# Patient Record
Sex: Male | Born: 1950 | Race: White | Hispanic: No | Marital: Married | Smoking: Former smoker
Health system: Southern US, Community
[De-identification: ages and names within clinical notes are randomized; demographics above are authoritative.]

## PROBLEM LIST (undated history)

## (undated) DIAGNOSIS — M1990 Unspecified osteoarthritis, unspecified site: Secondary | ICD-10-CM

## (undated) DIAGNOSIS — T8859XA Other complications of anesthesia, initial encounter: Secondary | ICD-10-CM

## (undated) DIAGNOSIS — Z720 Tobacco use: Secondary | ICD-10-CM

## (undated) DIAGNOSIS — Z87442 Personal history of urinary calculi: Secondary | ICD-10-CM

## (undated) DIAGNOSIS — R0600 Dyspnea, unspecified: Secondary | ICD-10-CM

## (undated) DIAGNOSIS — I714 Abdominal aortic aneurysm, without rupture, unspecified: Secondary | ICD-10-CM

## (undated) DIAGNOSIS — J439 Emphysema, unspecified: Secondary | ICD-10-CM

## (undated) DIAGNOSIS — J189 Pneumonia, unspecified organism: Secondary | ICD-10-CM

## (undated) DIAGNOSIS — E785 Hyperlipidemia, unspecified: Secondary | ICD-10-CM

## (undated) DIAGNOSIS — R7303 Prediabetes: Secondary | ICD-10-CM

## (undated) DIAGNOSIS — T4145XA Adverse effect of unspecified anesthetic, initial encounter: Secondary | ICD-10-CM

## (undated) DIAGNOSIS — C801 Malignant (primary) neoplasm, unspecified: Secondary | ICD-10-CM

## (undated) DIAGNOSIS — G4730 Sleep apnea, unspecified: Secondary | ICD-10-CM

## (undated) DIAGNOSIS — Z9981 Dependence on supplemental oxygen: Secondary | ICD-10-CM

## (undated) DIAGNOSIS — I739 Peripheral vascular disease, unspecified: Secondary | ICD-10-CM

## (undated) DIAGNOSIS — T7840XA Allergy, unspecified, initial encounter: Secondary | ICD-10-CM

## (undated) DIAGNOSIS — Z85118 Personal history of other malignant neoplasm of bronchus and lung: Secondary | ICD-10-CM

## (undated) DIAGNOSIS — I499 Cardiac arrhythmia, unspecified: Secondary | ICD-10-CM

## (undated) DIAGNOSIS — I10 Essential (primary) hypertension: Secondary | ICD-10-CM

## (undated) DIAGNOSIS — I4891 Unspecified atrial fibrillation: Secondary | ICD-10-CM

## (undated) DIAGNOSIS — I639 Cerebral infarction, unspecified: Secondary | ICD-10-CM

## (undated) HISTORY — DX: Hyperlipidemia, unspecified: E78.5

## (undated) HISTORY — DX: Cerebral infarction, unspecified: I63.9

## (undated) HISTORY — DX: Allergy, unspecified, initial encounter: T78.40XA

## (undated) HISTORY — PX: APPENDECTOMY: SHX54

---

## 1997-09-30 HISTORY — PX: HERNIA REPAIR: SHX51

## 1999-01-10 ENCOUNTER — Emergency Department (HOSPITAL_COMMUNITY): Admission: EM | Admit: 1999-01-10 | Discharge: 1999-01-10 | Payer: Self-pay | Admitting: Emergency Medicine

## 2003-07-09 ENCOUNTER — Emergency Department (HOSPITAL_COMMUNITY): Admission: AD | Admit: 2003-07-09 | Discharge: 2003-07-10 | Payer: Self-pay | Admitting: Emergency Medicine

## 2003-07-11 ENCOUNTER — Emergency Department (HOSPITAL_COMMUNITY): Admission: AD | Admit: 2003-07-11 | Discharge: 2003-07-11 | Payer: Self-pay | Admitting: Family Medicine

## 2009-04-24 ENCOUNTER — Emergency Department (HOSPITAL_COMMUNITY): Admission: EM | Admit: 2009-04-24 | Discharge: 2009-04-24 | Payer: Self-pay | Admitting: Emergency Medicine

## 2010-06-16 ENCOUNTER — Emergency Department (HOSPITAL_BASED_OUTPATIENT_CLINIC_OR_DEPARTMENT_OTHER): Admission: EM | Admit: 2010-06-16 | Discharge: 2010-06-16 | Payer: Self-pay | Admitting: Emergency Medicine

## 2010-06-16 ENCOUNTER — Ambulatory Visit: Payer: Self-pay | Admitting: Interventional Radiology

## 2010-06-28 ENCOUNTER — Ambulatory Visit: Payer: Self-pay | Admitting: Thoracic Surgery

## 2010-06-28 ENCOUNTER — Encounter: Admission: RE | Admit: 2010-06-28 | Discharge: 2010-06-28 | Payer: Self-pay

## 2010-07-03 ENCOUNTER — Ambulatory Visit (HOSPITAL_COMMUNITY): Admission: RE | Admit: 2010-07-03 | Discharge: 2010-07-03 | Payer: Self-pay | Admitting: Thoracic Surgery

## 2010-07-04 ENCOUNTER — Ambulatory Visit: Payer: Self-pay | Admitting: Thoracic Surgery

## 2010-10-22 ENCOUNTER — Other Ambulatory Visit: Payer: Self-pay | Admitting: Thoracic Surgery

## 2010-10-22 DIAGNOSIS — R911 Solitary pulmonary nodule: Secondary | ICD-10-CM

## 2010-10-23 ENCOUNTER — Ambulatory Visit: Admit: 2010-10-23 | Payer: Self-pay | Admitting: Thoracic Surgery

## 2010-12-05 ENCOUNTER — Other Ambulatory Visit: Payer: Self-pay

## 2010-12-13 LAB — GLUCOSE, CAPILLARY: Glucose-Capillary: 103 mg/dL — ABNORMAL HIGH (ref 70–99)

## 2010-12-25 ENCOUNTER — Other Ambulatory Visit: Payer: Self-pay

## 2010-12-25 ENCOUNTER — Ambulatory Visit: Payer: Self-pay | Admitting: Thoracic Surgery

## 2011-02-12 NOTE — Letter (Signed)
July 04, 2010   Sunnie Nielsen, MD  [address]  [place], [place] [address]   Re:  Randall Sullivan, Randall Sullivan              DOB:  1951/05/25   Dear Dr. Dierdre Highman:   I saw the patient in the office today and reviewed his PET scan.  As you  know, this patient has a right upper lobe ground glass opacity that was  seen and a PET scan showed very low level of uptake but lacks from SUV  of 1.6, indicating that this could possibly be inflammatory, but it also  could be a slow-growing bronchoalveolar cancer.  Given that the PET scan  was negative, I think we can safely follow this.  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  I  explained this to the patient and he agrees with the plan.  He has had  recent congestion and cough.  I gave him prescription for Tussionex.  I  plan to see him back in the next 3 months with a CT scan.  I appreciate  the opportunity of seeing the patient.   Sincerely,   Ines Bloomer, M.D.  Electronically Signed   DPB/MEDQ  D:  07/04/2010  T:  07/05/2010  Job:  [id number]

## 2011-02-12 NOTE — Letter (Signed)
June 28, 2010   Sunnie Nielsen, MD  [address]  [place], [place] [address]   Re:  Randall Sullivan, Randall Sullivan              DOB:  1951/06/25   Dear Dr. Dierdre Highman:   I saw the patient in the office today.  This 60 year old patient with a  long history of chronic obstructive pulmonary disease and smoking up to  1/2 a pack per day.  He recently had an exacerbation of his chronic  obstructive pulmonary disease and underwent a chest x-ray that showed a  right lower lobe lesion and then a CT scan, which showed a spiculated1.7  x 1.5 lesion in the left upper lobe that was thought to be to an early  cancer or scarring.  He had no hemoptysis, fevers, chills, or excessive  sputum.   MEDICATIONS:  1. Prednisone 20 mg for 5 days.  2. Albuterol.   ALLERGIES:  He has had no allergies.   SOCIAL HISTORY:  He has had emphysema for 8-9 years.  He is married. He  works in Statistician.[name]  He smokes a pack of cigarettes every 2-3 days.  Does not drink alcohol on a regular basis.   REVIEW OF SYSTEMS:  VITAL SIGNS:  He is 103 pounds.  He is 5 feet 10  inches.  GENERAL:  He has had weight loss.  CARDIAC:  No angina or atrial fibrillation.  PULMONARY:  He has had a chronic cough.  No hemoptysis.  GI:  No nausea, vomiting, constipation, or diarrhea.  GU:  No kidney disease, dysuria, or frequent urination.  VASCULAR:  No claudication, DVT, or TIAs.  NEUROLOGICAL:  No dizziness, headaches, blackouts, or seizures.  MUSCULOSKELETAL:  No arthritis or rash.  PSYCHIATRIC:  No depression or nervousness.  EYE/ENT:  No changes in eyesight or hearing.  HEMATOLOGICAL:  No problems with bleeding, clotting disorders, or  anemia.   PHYSICAL EXAMINATION:  General:  He is a well-developed Caucasian male,  in no acute distress.  Head, Eyes, Ears, Nose, and Throat:  Unremarkable.  Vital Signs:  His blood pressure was 129/77, pulse 72,  respirations 18, and sats were 96%.  Chest:  There is increased AP  diameter with some distal  wheezes.  Heart:  Regular sinus rhythm.  Abdomen:  Soft.  There is no hepatosplenomegaly.  Extremities:  Pulses  are 2+.  There is no clubbing or edema.  Neurologic:  He is oriented x3.  Sensory and motor intact.  Cranial nerves are intact.   I feel that this lesion could be just a scar as you see sometimes in  people with chronic obstructive pulmonary disease or it could be that  this is an early cancer.  The next step is to get a PET scan and if that  is positive, then we will proceed with workup.  If that is negative,  then we will just elect to follow up with serial CTs.  I will see him  back again after his PET scan.   Sincerely,   Ines Bloomer, M.D.  Electronically Signed   DPB/MEDQ  D:  06/28/2010  T:  06/29/2010  Job:  [id number]   cc:   Lajuana Matte, MD

## 2011-10-04 ENCOUNTER — Emergency Department (HOSPITAL_COMMUNITY): Payer: Self-pay

## 2011-10-04 ENCOUNTER — Emergency Department (HOSPITAL_COMMUNITY)
Admission: EM | Admit: 2011-10-04 | Discharge: 2011-10-04 | Disposition: A | Discharge status: Home / Self Care | Payer: Self-pay | Attending: Emergency Medicine | Admitting: Emergency Medicine

## 2011-10-04 ENCOUNTER — Encounter: Payer: Self-pay | Admitting: *Deleted

## 2011-10-04 DIAGNOSIS — F17200 Nicotine dependence, unspecified, uncomplicated: Secondary | ICD-10-CM | POA: Insufficient documentation

## 2011-10-04 DIAGNOSIS — R509 Fever, unspecified: Secondary | ICD-10-CM | POA: Insufficient documentation

## 2011-10-04 DIAGNOSIS — J438 Other emphysema: Secondary | ICD-10-CM | POA: Insufficient documentation

## 2011-10-04 DIAGNOSIS — R1013 Epigastric pain: Secondary | ICD-10-CM | POA: Insufficient documentation

## 2011-10-04 LAB — COMPREHENSIVE METABOLIC PANEL
Alkaline Phosphatase: 60 U/L (ref 39–117)
BUN: 12 mg/dL (ref 6–23)
CO2: 31 mEq/L (ref 19–32)
Chloride: 99 mEq/L (ref 96–112)
Creatinine, Ser: 0.83 mg/dL (ref 0.50–1.35)
GFR calc Af Amer: >90 mL/min (ref >90)
Sodium: 137 mEq/L (ref 135–145)
Total Protein: 7.8 g/dL (ref 6.0–8.3)

## 2011-10-04 LAB — DIFFERENTIAL
Basophils Absolute: 0 10*3/uL (ref 0.0–0.1)
Basophils Relative: 0 % (ref 0–1)
Lymphocytes Relative: 32 % (ref 12–46)
Neutro Abs: 3.7 10*3/uL (ref 1.7–7.7)

## 2011-10-04 LAB — URINALYSIS, ROUTINE W REFLEX MICROSCOPIC
Ketones, ur: NEGATIVE mg/dL
Leukocytes, UA: NEGATIVE
Nitrite: NEGATIVE
Protein, ur: NEGATIVE mg/dL
pH: 6.5 (ref 5.0–8.0)

## 2011-10-04 LAB — CBC
MCHC: 32.7 g/dL (ref 30.0–36.0)
Platelets: 200 10*3/uL (ref 150–400)
RDW: 14.6 % (ref 11.5–15.5)
WBC: 6.3 10*3/uL (ref 4.0–10.5)

## 2011-10-04 MED ORDER — HYDROCODONE-ACETAMINOPHEN 5-500 MG PO TABS: 1.0000 | ORAL_TABLET | Freq: Four times a day (QID) | ORAL | Status: AC | PRN

## 2011-10-04 NOTE — ED Provider Notes (Addendum)
History     CSN: [id number]  Arrival date & time 10/04/11  0810   First MD Initiated Contact with Patient 10/04/11 1002      Chief Complaint  Patient presents with  . Fever  . Abdominal Pain    (Consider location/radiation/quality/duration/timing/severity/associated sxs/prior treatment) HPI Comments: Three day history of epigastric pain, worse at night and sitting forward.  Feels fevered at night.  Also complains of cough with history of copd.    Patient is a 61 y.o. male presenting with abdominal pain. The history is provided by the patient.  Abdominal Pain The primary symptoms of the illness include abdominal pain and fever. The current episode started more than 2 days ago. The onset of the illness was gradual. The problem has been gradually worsening.  Episode onset: feels fevered at night.  Associated with: nothing.    Past Medical History  Diagnosis Date  . Emphysema     History reviewed. No pertinent past surgical history.  History reviewed. No pertinent family history.  History  Substance Use Topics  . Smoking status: Current Everyday Smoker -- 0.5 packs/day    Types: Cigarettes  . Smokeless tobacco: Not on file  . Alcohol Use: No      Review of Systems  Constitutional: Positive for fever.  Gastrointestinal: Positive for abdominal pain.  All other systems reviewed and are negative.    Allergies  Review of patient's allergies indicates no known allergies.  Home Medications   Current Outpatient Rx  Name Route Sig Dispense Refill  . ALBUTEROL SULFATE HFA 108 (90 BASE) MCG/ACT IN AERS Inhalation Inhale 2 puffs into the lungs every 6 (six) hours as needed. For shortness of breath      . MUCINEX FAST-MAX DM MAX PO Oral Take 20 mLs by mouth every 6 (six) hours as needed. congestion      BP 148/89  Pulse 88  Temp 97.5 F (36.4 C)  Resp 18  SpO2 99%  Physical Exam  Nursing note and vitals reviewed. Constitutional: He is oriented to person, place,  and time. He appears well-developed and well-nourished. No distress.  HENT:  Head: Normocephalic and atraumatic.  Neck: Normal range of motion. Neck supple.  Cardiovascular: Normal rate and regular rhythm.  Exam reveals no friction rub.   No murmur heard. Pulmonary/Chest: Effort normal and breath sounds normal. No respiratory distress. He has no wheezes.  Abdominal: Soft. Bowel sounds are normal. He exhibits no distension. There is no tenderness.       No epigastric ttp.  Musculoskeletal: Normal range of motion. He exhibits no edema.  Neurological: He is alert and oriented to person, place, and time.  Skin: Skin is warm and dry. He is not diaphoretic.    ED Course  Procedures (including critical care time)   Labs Reviewed  CBC  DIFFERENTIAL  COMPREHENSIVE METABOLIC PANEL  URINALYSIS, ROUTINE W REFLEX MICROSCOPIC  LIPASE, BLOOD   No results found.   No diagnosis found.    MDM  Labs okay, abdomen benign.  Will discharge to home with pain meds, time.  To return prn.          Geoffery Lyons, MD 10/04/11 1254  Geoffery Lyons, MD 10/04/11 1254

## 2011-10-04 NOTE — ED Notes (Signed)
Pt reports having fever, abd pain and lower back pain for several days. Having nausea, no vomiting or diarrhea. No acute distress noted at triage.

## 2011-10-04 NOTE — ED Notes (Signed)
Pt. reports a 3 day hx. Of coughing and Flu like s/s.  PT. reports epigastric pain that he noticed after a coughing spell.  Pt. Is concerned about the coughing due to his hx. Of Empasma.

## 2012-11-04 ENCOUNTER — Emergency Department (HOSPITAL_COMMUNITY)
Admission: EM | Admit: 2012-11-04 | Discharge: 2012-11-04 | Disposition: A | Discharge status: Home / Self Care | Payer: Self-pay | Attending: Emergency Medicine | Admitting: Emergency Medicine

## 2012-11-04 ENCOUNTER — Encounter (HOSPITAL_COMMUNITY): Payer: Self-pay | Admitting: Adult Health

## 2012-11-04 ENCOUNTER — Emergency Department (HOSPITAL_COMMUNITY): Payer: Self-pay

## 2012-11-04 DIAGNOSIS — Y92009 Unspecified place in unspecified non-institutional (private) residence as the place of occurrence of the external cause: Secondary | ICD-10-CM | POA: Insufficient documentation

## 2012-11-04 DIAGNOSIS — J438 Other emphysema: Secondary | ICD-10-CM | POA: Insufficient documentation

## 2012-11-04 DIAGNOSIS — F17200 Nicotine dependence, unspecified, uncomplicated: Secondary | ICD-10-CM | POA: Insufficient documentation

## 2012-11-04 DIAGNOSIS — S39012A Strain of muscle, fascia and tendon of lower back, initial encounter: Secondary | ICD-10-CM

## 2012-11-04 DIAGNOSIS — S20229A Contusion of unspecified back wall of thorax, initial encounter: Secondary | ICD-10-CM | POA: Insufficient documentation

## 2012-11-04 DIAGNOSIS — S335XXA Sprain of ligaments of lumbar spine, initial encounter: Secondary | ICD-10-CM | POA: Insufficient documentation

## 2012-11-04 DIAGNOSIS — Z79899 Other long term (current) drug therapy: Secondary | ICD-10-CM | POA: Insufficient documentation

## 2012-11-04 DIAGNOSIS — W19XXXA Unspecified fall, initial encounter: Secondary | ICD-10-CM

## 2012-11-04 DIAGNOSIS — Y9389 Activity, other specified: Secondary | ICD-10-CM | POA: Insufficient documentation

## 2012-11-04 DIAGNOSIS — W138XXA Fall from, out of or through other building or structure, initial encounter: Secondary | ICD-10-CM | POA: Insufficient documentation

## 2012-11-04 DIAGNOSIS — S300XXA Contusion of lower back and pelvis, initial encounter: Secondary | ICD-10-CM

## 2012-11-04 MED ORDER — HYDROCODONE-ACETAMINOPHEN 5-325 MG PO TABS: 1.0000 | ORAL_TABLET | Freq: Four times a day (QID) | ORAL | Status: DC | PRN

## 2012-11-04 MED ORDER — MORPHINE SULFATE 4 MG/ML IJ SOLN
6.0000 mg | Freq: Once | INTRAMUSCULAR | Status: AC
  Administered 2012-11-04: 6 mg via INTRAVENOUS
  Filled 2012-11-04: qty 2

## 2012-11-04 MED ORDER — IBUPROFEN 800 MG PO TABS: 800.0000 mg | ORAL_TABLET | Freq: Three times a day (TID) | ORAL | Status: DC | PRN

## 2012-11-04 MED ORDER — CYCLOBENZAPRINE HCL 10 MG PO TABS: 10.0000 mg | ORAL_TABLET | Freq: Three times a day (TID) | ORAL | Status: DC | PRN

## 2012-11-04 NOTE — ED Provider Notes (Signed)
History     CSN: [id number]  Arrival date & time 11/04/12  1648   First MD Initiated Contact with Patient 11/04/12 1720      Chief Complaint  Patient presents with  . Fall    (Consider location/radiation/quality/duration/timing/severity/associated sxs/prior treatment) Patient is a 62 y.o. male presenting with fall.  Fall   Patient is a 62 year old male who presents today after a fall.   He was on his roof about an hour and a half ago and slipped and fell off of the roof which was about 12 feet off of the ground.  He landed on his back and hit his head.  Denies loss of consciousness, headache, neck pain, LOC, dizziness,  or changes in vision.  Can flex and extend his back but with some pain.  Rotation at the hips helps alleviate the pain.  Also complains of right thumb pain and swelling which he cannot move.    Past Medical History  Diagnosis Date  . Emphysema     History reviewed. No pertinent past surgical history.  History reviewed. No pertinent family history.  History  Substance Use Topics  . Smoking status: Current Every Day Smoker -- 0.5 packs/day    Types: Cigarettes  . Smokeless tobacco: Not on file  . Alcohol Use: No      Review of Systems All other systems negative except as documented in the HPI. All pertinent positives and negatives as reviewed in the HPI.  Allergies  Review of patient's allergies indicates no known allergies.  Home Medications   Current Outpatient Rx  Name  Route  Sig  Dispense  Refill  . ALBUTEROL SULFATE HFA 108 (90 BASE) MCG/ACT IN AERS   Inhalation   Inhale 2 puffs into the lungs every 6 (six) hours as needed. For shortness of breath           . MUCINEX FAST-MAX DM MAX PO   Oral   Take 20 mLs by mouth every 6 (six) hours as needed. congestion           BP 162/93  Pulse 95  Temp 98.7 F (37.1 C)  Resp 22  SpO2 90%  Physical Exam  Constitutional: He is oriented to person, place, and time. He appears well-developed  and well-nourished.  HENT:  Head: Normocephalic and atraumatic.  Mouth/Throat: Oropharynx is clear and moist.  Eyes: Pupils are equal, round, and reactive to light.  Neck: Normal range of motion.  Cardiovascular: Normal rate, regular rhythm and normal heart sounds.  Exam reveals no gallop and no friction rub.   No murmur heard. Pulmonary/Chest: Effort normal and breath sounds normal. No respiratory distress. He exhibits no tenderness.  Abdominal: Soft. Bowel sounds are normal. He exhibits no distension. There is no tenderness.  Musculoskeletal:       Cervical back: Normal.       Thoracic back: Normal.       Lumbar back: He exhibits tenderness and pain.  Neurological: He is alert and oriented to person, place, and time. He has normal strength and normal reflexes.  Skin: Skin is warm and dry.     ED Course  Procedures (including critical care time)   6:00pm patient received morphine 6:30pm patient is feeling better after receiving morphine.  Resting comfortably.    MDM  Patient does not have any cervical spine tenderness, or motor deficits. The patient has normal reflexes.         Carlyle Dolly, PA-C 11/05/12 1931

## 2012-11-04 NOTE — ED Notes (Signed)
Fall from roof at 16:00 c/o lower back pain, buttock pain. Able to move all extremities, bilateral breath sounds clear and equal. Denies SOB.

## 2012-11-04 NOTE — ED Notes (Signed)
Pt ambulated down the hallway without issue.  PA made aware

## 2012-11-04 NOTE — ED Notes (Signed)
EDP in room with pt 

## 2012-11-04 NOTE — ED Notes (Signed)
Patient transported to X-ray 

## 2012-11-04 NOTE — ED Notes (Signed)
Returns from xray

## 2012-11-09 NOTE — ED Provider Notes (Signed)
Medical screening examination/treatment/procedure(s) were conducted as a shared visit with non-physician practitioner(s) and myself.  I personally evaluated the patient during the encounter. Patient had some lower back discomfort after the fall. Patient did much better after analgesia. No sign of vertebral or spinal injury. Patient discharged.  Gilda Crease, MD 11/09/12 1316

## 2014-10-06 DIAGNOSIS — I10 Essential (primary) hypertension: Secondary | ICD-10-CM | POA: Insufficient documentation

## 2014-11-27 ENCOUNTER — Emergency Department (HOSPITAL_COMMUNITY)
Admission: EM | Admit: 2014-11-27 | Discharge: 2014-11-27 | Disposition: A | Discharge status: Home / Self Care | Payer: Self-pay | Attending: Emergency Medicine | Admitting: Emergency Medicine

## 2014-11-27 ENCOUNTER — Emergency Department (HOSPITAL_COMMUNITY): Payer: Self-pay

## 2014-11-27 ENCOUNTER — Encounter (HOSPITAL_COMMUNITY): Payer: Self-pay | Admitting: Emergency Medicine

## 2014-11-27 DIAGNOSIS — L03115 Cellulitis of right lower limb: Secondary | ICD-10-CM | POA: Insufficient documentation

## 2014-11-27 DIAGNOSIS — Z720 Tobacco use: Secondary | ICD-10-CM | POA: Insufficient documentation

## 2014-11-27 DIAGNOSIS — I10 Essential (primary) hypertension: Secondary | ICD-10-CM | POA: Insufficient documentation

## 2014-11-27 DIAGNOSIS — Z79899 Other long term (current) drug therapy: Secondary | ICD-10-CM | POA: Insufficient documentation

## 2014-11-27 DIAGNOSIS — M79671 Pain in right foot: Secondary | ICD-10-CM

## 2014-11-27 DIAGNOSIS — Z791 Long term (current) use of non-steroidal anti-inflammatories (NSAID): Secondary | ICD-10-CM | POA: Insufficient documentation

## 2014-11-27 DIAGNOSIS — J439 Emphysema, unspecified: Secondary | ICD-10-CM | POA: Insufficient documentation

## 2014-11-27 HISTORY — DX: Essential (primary) hypertension: I10

## 2014-11-27 MED ORDER — NAPROXEN 500 MG PO TABS: 500.0000 mg | ORAL_TABLET | Freq: Two times a day (BID) | ORAL | Status: DC

## 2014-11-27 MED ORDER — OXYCODONE-ACETAMINOPHEN 5-325 MG PO TABS
1.0000 | ORAL_TABLET | Freq: Once | ORAL | Status: AC
  Administered 2014-11-27: 1 via ORAL
  Filled 2014-11-27: qty 1

## 2014-11-27 MED ORDER — OXYCODONE-ACETAMINOPHEN 5-325 MG PO TABS: 1.0000 | ORAL_TABLET | ORAL | Status: DC | PRN

## 2014-11-27 NOTE — ED Notes (Signed)
Pt reports cellulitis in his R foot, was seen at Urgent Care on Wed. Placed on abx. Pt states the edema is better but still having a lot of pain.

## 2014-11-27 NOTE — ED Provider Notes (Signed)
CSN: [id number]     Arrival date & time 11/27/14  1139 History  This chart was scribed for non-physician practitioner, Kem Parkinson, PA-C, working with Maudry Diego, MD, by Chester Holstein, ED Scribe. This patient was seen in room APFT24/APFT24 and the patient's care was started at 12:19 PM.    Chief Complaint  Patient presents with  . Cellulitis   The history is provided by the patient. No language interpreter was used.   HPI Comments: Randall Sullivan is a 64 y.o. male with PMHx of emphysema and HTN who presents to the Emergency Department complaining of swelling, redness, and pain to right foot. Pt notes he was seen in an Urgent Care, diagnosed with cellulitis. Pt was given Rx for Keflex and Bactrim and pain medication. Pt reports no X-rays were done. Pt started Abx 4 days ago. Pt has not had a f/u yet.  Pt states redness and swelling have improved but notes pain has not changed. Pt notes pain is most centered to plantar surface of foot, with some pain to the lateral foot.  Pt denies any known injury, numbness and pain in rest of right leg. Reports pain is constant , increasing with weight bearing.  Past Medical History  Diagnosis Date  . Emphysema   . Hypertension    History reviewed. No pertinent past surgical history. Family History  Problem Relation Age of Onset  . Cancer Mother   . Heart failure Brother    History  Substance Use Topics  . Smoking status: Current Every Day Smoker -- 0.50 packs/day    Types: Cigarettes  . Smokeless tobacco: Never Used  . Alcohol Use: No    Review of Systems  Constitutional: Negative for fever and chills.  Gastrointestinal: Negative for nausea and vomiting.  Musculoskeletal: Positive for arthralgias (right foot pain).       Edema to right foot  Skin: Negative for color change and wound.  Neurological: Negative for weakness and numbness.  All other systems reviewed and are negative.     Allergies  Review of patient's allergies indicates  no known allergies.  Home Medications   Prior to Admission medications   Medication Sig Start Date End Date Taking? Authorizing Provider  acetaminophen (TYLENOL) 500 MG tablet Take 500 mg by mouth every 6 (six) hours as needed. For pain    Historical Provider, MD  albuterol (PROVENTIL HFA;VENTOLIN HFA) 108 (90 BASE) MCG/ACT inhaler Inhale 2 puffs into the lungs every 6 (six) hours as needed. For shortness of breath      Historical Provider, MD  cyclobenzaprine (FLEXERIL) 10 MG tablet Take 1 tablet (10 mg total) by mouth 3 (three) times daily as needed for muscle spasms. 11/04/12   Brent General, PA-C  HYDROcodone-acetaminophen (NORCO/VICODIN) 5-325 MG per tablet Take 1 tablet by mouth every 6 (six) hours as needed for pain. 11/04/12   [place], PA-C  ibuprofen (ADVIL,MOTRIN) 800 MG tablet Take 1 tablet (800 mg total) by mouth every 8 (eight) hours as needed for pain. 11/04/12   [place], PA-C   BP 131/79 mmHg  Pulse 83  Temp(Src) 97.5 F (36.4 C) (Oral)  Resp 20  Ht 5\' 10"  (1.778 m)  Wt 184 lb (83.462 kg)  BMI 26.40 kg/m2  SpO2 97% Physical Exam  Constitutional: He is oriented to person, place, and time. He appears well-developed and well-nourished.  HENT:  Head: Normocephalic.  Cardiovascular: Normal rate, regular rhythm, normal heart sounds and intact distal pulses.  Exam reveals  no friction rub.   No murmur heard. Distal pulses intact  Pulmonary/Chest: Effort normal and breath sounds normal. No respiratory distress. He has no wheezes. He has no rales.  Musculoskeletal: Normal range of motion.  Localized edema of the dorsal right foot; TTP to the lateral foot and first metatarsal. No bony deformity noted  Neurological: He is alert and oriented to person, place, and time. He exhibits normal muscle tone. Coordination normal.  Normal sensation, DP pulses brisk  Skin: Skin is warm and dry.  Psychiatric: He has a normal mood and affect. His behavior is normal.   Nursing note and vitals reviewed.   ED Course  Procedures (including critical care time) DIAGNOSTIC STUDIES: Oxygen Saturation is 97% on room air, normal by my interpretation.    COORDINATION OF CARE: 12:28 PM Discussed treatment plan with patient at beside, the patient agrees with the plan and has no further questions at this time.   Labs Review Labs Reviewed - No data to display  Imaging Review Dg Foot Complete Right  11/27/2014   CLINICAL DATA:  Right foot pain/swelling/redness  EXAM: RIGHT FOOT COMPLETE - 3+ VIEW  COMPARISON:  None.  FINDINGS: No fracture or dislocation is seen.  The joint spaces are preserved.  The visualized soft tissues are unremarkable.  IMPRESSION: No acute osseous abnormality is seen.   Electronically Signed   By: Julian Hy M.D.   On: 11/27/2014 13:41      EKG Interpretation None      MDM   Final diagnoses:  Right foot pain  Cellulitis of right lower extremity   patient currently taking keflex and bactrim for previously diagnosed cellulitis of the right foot.  Remains NV intact. Compartments of the right LE are soft.  Pain likely related to the cellulitis, xray neg for fx    I personally performed the services described in this documentation, which was scribed in my presence. The recorded information has been reviewed and is accurate.     Jadin Creque L. Vanessa [place], PA-C 11/29/14 [place], MD 12/01/14 [phone]

## 2014-11-27 NOTE — Discharge Instructions (Signed)

## 2015-08-01 ENCOUNTER — Encounter: Payer: Self-pay | Admitting: Family Medicine

## 2015-08-01 ENCOUNTER — Encounter (INDEPENDENT_AMBULATORY_CARE_PROVIDER_SITE_OTHER): Payer: Self-pay

## 2015-08-01 ENCOUNTER — Ambulatory Visit: Payer: Self-pay | Admitting: Family Medicine

## 2015-08-01 ENCOUNTER — Other Ambulatory Visit: Payer: Self-pay | Admitting: *Deleted

## 2015-08-01 VITALS — BP 145/85 | HR 76 | Temp 97.0°F | Ht 69.0 in | Wt 184.0 lb

## 2015-08-01 DIAGNOSIS — K30 Functional dyspepsia: Secondary | ICD-10-CM

## 2015-08-01 DIAGNOSIS — J449 Chronic obstructive pulmonary disease, unspecified: Secondary | ICD-10-CM

## 2015-08-01 DIAGNOSIS — M159 Polyosteoarthritis, unspecified: Secondary | ICD-10-CM

## 2015-08-01 DIAGNOSIS — F17200 Nicotine dependence, unspecified, uncomplicated: Secondary | ICD-10-CM

## 2015-08-01 DIAGNOSIS — J309 Allergic rhinitis, unspecified: Secondary | ICD-10-CM

## 2015-08-01 DIAGNOSIS — I10 Essential (primary) hypertension: Secondary | ICD-10-CM

## 2015-08-01 DIAGNOSIS — M150 Primary generalized (osteo)arthritis: Secondary | ICD-10-CM

## 2015-08-01 MED ORDER — ALBUTEROL SULFATE (2.5 MG/3ML) 0.083% IN NEBU: 3.0000 mL | INHALATION_SOLUTION | Freq: Four times a day (QID) | RESPIRATORY_TRACT | Status: DC | PRN

## 2015-08-01 MED ORDER — FEXOFENADINE HCL 180 MG PO TABS: 180.0000 mg | ORAL_TABLET | Freq: Every day | ORAL | Status: DC

## 2015-08-01 MED ORDER — ALBUTEROL SULFATE HFA 108 (90 BASE) MCG/ACT IN AERS: 2.0000 | INHALATION_SPRAY | Freq: Four times a day (QID) | RESPIRATORY_TRACT | Status: DC | PRN

## 2015-08-01 NOTE — Progress Notes (Signed)
Subjective:  Patient ID: Randall Sullivan, male    DOB: Jul 19, 1951  Age: 64 y.o. MRN: [id number]  CC: Establish Care; Hypertension; and COPD   HPI Randall Sullivan presents for COPD. Out of neb med. USes inhaler prn. He says that it's cheaper for him to get the nebulizer solution and use that twice a day. Therefore he does not use the inhaler very often. He does use of his rescue when he is away from home. He states that his shortness of breath is mild. 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 He can climb ladders etc. without any adverse effect.   follow-up of hypertension. Patient has no history of headache chest pain or shortness of breath or recent cough. Patient also denies symptoms of TIA such as numbness weakness lateralizing. Patient does not check blood pressure he's been out of his medicine for several days.  History Randall Sullivan has a past medical history of Emphysema and Hypertension.  COPD He has no past surgical history on file. has never had surgery.  His family history includes Aneurysm in his sister; Cancer in his mother and sister; Kidney disease in his paternal aunt.He reports that he has been smoking Cigarettes.  He has been smoking about 0.50 packs per day. He has never used smokeless tobacco. He reports that he does not drink alcohol or use illicit drugs.    ROS Review of Systems  Constitutional: Negative for fever, chills, diaphoresis, activity change, appetite change, fatigue and unexpected weight change.  HENT: Negative for congestion, ear pain, hearing loss, postnasal drip, rhinorrhea, sore throat, tinnitus and trouble swallowing.   Eyes: Negative for photophobia, pain, discharge and redness.  Respiratory: Positive for cough and shortness of breath (periodic, see history of present illness). Negative for apnea, choking, chest tightness, wheezing and stridor.   Cardiovascular: Negative for chest pain, palpitations and leg swelling.  Gastrointestinal:  Negative for nausea, vomiting, abdominal pain, diarrhea, constipation, blood in stool and abdominal distention.       Occasional heartburn when he eats close to bedtime. This is because of his irregular work schedule. Usually relieved pretty easily with OTC antacids  Endocrine: Negative for cold intolerance, heat intolerance, polydipsia, polyphagia and polyuria.  Genitourinary: Negative for dysuria, urgency, frequency, hematuria, flank pain, enuresis, difficulty urinating and genital sores.  Musculoskeletal: Negative for joint swelling and arthralgias.  Skin: Negative for color change, rash and wound.  Allergic/Immunologic: Negative for immunocompromised state.  Neurological: Negative for dizziness, tremors, seizures, syncope, facial asymmetry, speech difficulty, weakness, light-headedness, numbness and headaches.  Hematological: Does not bruise/bleed easily.  Psychiatric/Behavioral: Negative for suicidal ideas, hallucinations, behavioral problems, confusion, sleep disturbance, dysphoric mood, decreased concentration and agitation. The patient is not nervous/anxious and is not hyperactive.     Objective:  BP 145/85 mmHg  Pulse 76  Temp(Src) 97 F (36.1 C) (Oral)  Ht '5\' 9"'$  (1.753 m)  Wt 184 lb (83.462 kg)  BMI 27.16 kg/m2  BP Readings from Last 3 Encounters:  08/01/15 145/85  11/27/14 131/78  11/04/12 133/72    Wt Readings from Last 3 Encounters:  08/01/15 184 lb (83.462 kg)  11/27/14 184 lb (83.462 kg)     Physical Exam  Constitutional: He is oriented to person, place, and time. He appears well-developed and well-nourished.  HENT:  Head: Normocephalic and atraumatic.  Mouth/Throat: Oropharynx is clear and moist.  Eyes: EOM are normal. Pupils are equal, round, and reactive to light.  Neck: Normal range of motion.  No tracheal deviation present. No thyromegaly present.  Cardiovascular: Normal rate, regular rhythm and normal heart sounds.  Exam reveals no gallop and no friction  rub.   No murmur heard. Pulmonary/Chest: Breath sounds normal. He has no wheezes. He has no rales.  Abdominal: Soft. He exhibits no mass. There is no tenderness.  Musculoskeletal: Normal range of motion. He exhibits no edema.  Neurological: He is alert and oriented to person, place, and time.  Skin: Skin is warm and dry.  Psychiatric: He has a normal mood and affect.    No results found for: HGBA1C  Lab Results  Component Value Date   WBC 6.3 10/04/2011   HGB 15.9 10/04/2011   HCT 48.6 10/04/2011   PLT 200 10/04/2011   GLUCOSE 101* 10/04/2011   ALT 25 10/04/2011   AST 24 10/04/2011   NA 137 10/04/2011   K 4.5 10/04/2011   CL 99 10/04/2011   CREATININE 0.83 10/04/2011   BUN 12 10/04/2011   CO2 31 10/04/2011    Dg Foot Complete Right  11/27/2014  CLINICAL DATA:  Right foot pain/swelling/redness EXAM: RIGHT FOOT COMPLETE - 3+ VIEW COMPARISON:  None. FINDINGS: No fracture or dislocation is seen. The joint spaces are preserved. The visualized soft tissues are unremarkable. IMPRESSION: No acute osseous abnormality is seen. Electronically Signed   By: Julian Hy M.D.   On: 11/27/2014 13:41    Assessment & Plan:   Randall Sullivan was seen today for establish care, hypertension and copd.  Diagnoses and all orders for this visit:  Essential hypertension  COPD mixed type ([place])  Tobacco use disorder  Allergic rhinitis, unspecified allergic rhinitis type  Mild dietary indigestion  Primary osteoarthritis involving multiple joints  Other orders -     fexofenadine (ALLEGRA) 180 MG tablet; Take 1 tablet (180 mg total) by mouth daily. -     albuterol (PROVENTIL HFA;VENTOLIN HFA) 108 (90 BASE) MCG/ACT inhaler; Inhale 2 puffs into the lungs every 6 (six) hours as needed. For shortness of breath   I have discontinued Mr. Dunaway's cyclobenzaprine, ibuprofen, sulfamethoxazole-trimethoprim, cephALEXin, oxyCODONE-acetaminophen, and naproxen. I have also changed his albuterol. Additionally,  I am having him start on fexofenadine. Lastly, I am having him maintain his acetaminophen and lisinopril.  Meds ordered this encounter  Medications  . lisinopril (PRINIVIL,ZESTRIL) 20 MG tablet    Sig: Take 20 mg by mouth daily.  [name] DISCONTD: albuterol (PROVENTIL) (2.5 MG/3ML) 0.083% nebulizer solution    Sig: Take 3 mLs by nebulization 4 (four) times daily as needed.  . fexofenadine (ALLEGRA) 180 MG tablet    Sig: Take 1 tablet (180 mg total) by mouth daily.    Dispense:  30 tablet    Refill:  11  . albuterol (PROVENTIL HFA;VENTOLIN HFA) 108 (90 BASE) MCG/ACT inhaler    Sig: Inhale 2 puffs into the lungs every 6 (six) hours as needed. For shortness of breath    Dispense:  1 Inhaler    Refill:  11   Use Pepcid AC as needed for occasional heartburn and indigestion. On the other hand if indigestion and heartburn become a frequent occurrence 2-3 or more times a week consider using omeprazole 40 mg daily every day.   Follow-up: Return in about 6 months (around 01/29/2016) for COPD, hypertension, Arthritis.  Claretta Fraise, M.D.

## 2015-08-01 NOTE — Patient Instructions (Signed)
Use Pepcid AC as needed for occasional heartburn and indigestion. On the other hand if indigestion and heartburn become a frequent occurrence 2-3 or more times a week consider using omeprazole 40 mg daily every day.

## 2015-09-12 ENCOUNTER — Telehealth: Payer: Self-pay | Admitting: Family Medicine

## 2015-09-12 MED ORDER — AMOXICILLIN 500 MG PO CAPS: 500.0000 mg | ORAL_CAPSULE | Freq: Three times a day (TID) | ORAL | Status: DC

## 2015-09-12 NOTE — Telephone Encounter (Signed)
Pt notified rx sent into pharmacy

## 2015-09-12 NOTE — Telephone Encounter (Signed)
Please review and advise.

## 2015-09-12 NOTE — Telephone Encounter (Signed)
Amoxicillin prescription sent.

## 2015-09-15 ENCOUNTER — Other Ambulatory Visit: Payer: Self-pay | Admitting: Family Medicine

## 2015-10-01 DIAGNOSIS — I4891 Unspecified atrial fibrillation: Secondary | ICD-10-CM

## 2015-10-01 HISTORY — DX: Unspecified atrial fibrillation: I48.91

## 2015-11-07 ENCOUNTER — Other Ambulatory Visit: Payer: Self-pay | Admitting: Family Medicine

## 2015-11-16 ENCOUNTER — Other Ambulatory Visit: Payer: Self-pay | Admitting: Family Medicine

## 2015-11-16 MED ORDER — HYDROCORTISONE ACETATE 25 MG RE SUPP: 25.0000 mg | Freq: Two times a day (BID) | RECTAL | Status: DC

## 2015-11-16 MED ORDER — LIDOCAINE (ANORECTAL) 5 % EX CREA: TOPICAL_CREAM | CUTANEOUS | Status: DC

## 2015-11-16 NOTE — Telephone Encounter (Signed)
Pt is requesting this asap, Stacks pt.

## 2015-11-16 NOTE — Telephone Encounter (Signed)
I have sent hydrocortisone suppositories, this will do more for the inflammation of hemorrhoids.  I have also sent lidocaine cream by request   Laroy Apple, MD [facility] 11/16/2015, 2:11 PM

## 2015-11-20 NOTE — Telephone Encounter (Signed)
Left detailed message that rx's sent to pharmacy and to [facility] with any further questions or concerns.

## 2015-12-21 ENCOUNTER — Other Ambulatory Visit: Payer: Self-pay | Admitting: Family Medicine

## 2016-01-02 ENCOUNTER — Other Ambulatory Visit: Payer: Self-pay | Admitting: Family Medicine

## 2016-01-21 ENCOUNTER — Other Ambulatory Visit: Payer: Self-pay | Admitting: Family Medicine

## 2016-01-29 ENCOUNTER — Ambulatory Visit: Payer: Self-pay | Admitting: Family Medicine

## 2016-02-03 ENCOUNTER — Other Ambulatory Visit: Payer: Self-pay | Admitting: Family Medicine

## 2016-02-18 ENCOUNTER — Other Ambulatory Visit: Payer: Self-pay | Admitting: Family Medicine

## 2016-02-21 ENCOUNTER — Other Ambulatory Visit: Payer: Self-pay

## 2016-02-21 ENCOUNTER — Encounter: Payer: Self-pay | Admitting: Family Medicine

## 2016-02-21 ENCOUNTER — Ambulatory Visit (INDEPENDENT_AMBULATORY_CARE_PROVIDER_SITE_OTHER): Payer: Self-pay | Admitting: Family Medicine

## 2016-02-21 VITALS — BP 129/82 | HR 75 | Temp 97.0°F | Ht 69.0 in | Wt 176.4 lb

## 2016-02-21 DIAGNOSIS — Z1211 Encounter for screening for malignant neoplasm of colon: Secondary | ICD-10-CM

## 2016-02-21 DIAGNOSIS — M150 Primary generalized (osteo)arthritis: Secondary | ICD-10-CM

## 2016-02-21 DIAGNOSIS — J449 Chronic obstructive pulmonary disease, unspecified: Secondary | ICD-10-CM

## 2016-02-21 DIAGNOSIS — N400 Enlarged prostate without lower urinary tract symptoms: Secondary | ICD-10-CM

## 2016-02-21 DIAGNOSIS — I10 Essential (primary) hypertension: Secondary | ICD-10-CM

## 2016-02-21 DIAGNOSIS — M545 Low back pain, unspecified: Secondary | ICD-10-CM

## 2016-02-21 DIAGNOSIS — M25579 Pain in unspecified ankle and joints of unspecified foot: Secondary | ICD-10-CM

## 2016-02-21 DIAGNOSIS — K649 Unspecified hemorrhoids: Secondary | ICD-10-CM

## 2016-02-21 DIAGNOSIS — M159 Polyosteoarthritis, unspecified: Secondary | ICD-10-CM

## 2016-02-21 MED ORDER — TAMSULOSIN HCL 0.4 MG PO CAPS: 0.8000 mg | ORAL_CAPSULE | Freq: Every day | ORAL | Status: DC

## 2016-02-21 MED ORDER — CELECOXIB 200 MG PO CAPS: 200.0000 mg | ORAL_CAPSULE | Freq: Every day | ORAL | Status: DC

## 2016-02-21 NOTE — Progress Notes (Signed)
Subjective:  Patient ID: Randall Sullivan, male    DOB: 1950/10/17  Age: 65 y.o. MRN: [id number]  CC: Hypertension   HPI Randall Sullivan presents for  follow-up of hypertension. Patient has no history of headache chest pain or shortness of breath or recent cough. Patient also denies symptoms of TIA such as numbness weakness lateralizing. Patient checks  blood pressure at home and has not had any elevated readings recently. Patient denies side effects from medication. States taking it regularly.  Also complaining of pain in the feet. He points to the ball of the foot primarily at the base of the first toe bilaterally but also going toward the fifth toe at its base as well. Pain also is at the heel bilaterally.  Additionally patient is concerned that he has to get up 45 times a night to go to the bathroom. He is having some pain at the midline L5-S1 region. He is concerned that means he has gotten a prostate infection. Additionally he states that he does not have any burning. He goes just a little bit when he goes.  Patient states that he's had intermittent exacerbations of back pain for many years ever since a fall that hurt his back. Again is at the midline L5/S1.   History Randall Sullivan has a past medical history of Emphysema and Hypertension.   He has no past surgical history on file.   His family history includes Aneurysm in his sister; Cancer in his mother and sister; Kidney disease in his paternal aunt.He reports that he has been smoking Cigarettes.  He has been smoking about 0.50 packs per day. He has never used smokeless tobacco. He reports that he does not drink alcohol or use illicit drugs.  Current Outpatient Prescriptions on File Prior to Visit  Medication Sig Dispense Refill  . acetaminophen (TYLENOL) 500 MG tablet Take 500 mg by mouth every 6 (six) hours as needed. For pain    . albuterol (PROVENTIL HFA;VENTOLIN HFA) 108 (90 BASE) MCG/ACT inhaler Inhale 2 puffs into the lungs every 6 (six) hours  as needed. For shortness of breath 1 Inhaler 11  . albuterol (PROVENTIL) (2.5 MG/3ML) 0.083% nebulizer solution USE 1 VIAL PER NEBULIZATION FOUR TIMES DAILY AS NEEDED 75 mL 0  . fexofenadine (ALLEGRA) 180 MG tablet Take 1 tablet (180 mg total) by mouth daily. 30 tablet 11  . hydrocortisone (ANUSOL-HC) 25 MG suppository Place 1 suppository (25 mg total) rectally 2 (two) times daily. 12 suppository 0  . Lidocaine, Anorectal, 5 % CREA Apply to the rectum up to 6 times daily 45 g 0  . lisinopril (PRINIVIL,ZESTRIL) 20 MG tablet TAKE 1 TABLET (20 MG TOTAL) BY MOUTH DAILY. 30 tablet 2   No current facility-administered medications on file prior to visit.    ROS Review of Systems  Constitutional: Negative for fever, chills, diaphoresis and unexpected weight change.  HENT: Negative for congestion, hearing loss, rhinorrhea and sore throat.   Eyes: Negative for visual disturbance.  Respiratory: Negative for cough and shortness of breath ( Needs refills on his albuterol.).   Cardiovascular: Negative for chest pain.  Gastrointestinal: Negative for abdominal pain, diarrhea and constipation.  Genitourinary: Positive for frequency (with nocturia). Negative for dysuria and flank pain.  Musculoskeletal: Positive for myalgias, back pain and arthralgias. Negative for joint swelling.  Skin: Negative for rash.  Neurological: Negative for dizziness and headaches.  Hematological: Bruises/bleeds easily (concerned it is caused by his lisinopril.).  Psychiatric/Behavioral: Negative for sleep disturbance and dysphoric mood.  Objective:  BP 129/82 mmHg  Pulse 75  Temp(Src) 97 F (36.1 C) (Oral)  Ht _0  (1.753 m)  Wt 176 lb 6.4 oz (80.015 kg)  BMI 26.04 kg/m2  SpO2 97%  BP Readings from Last 3 Encounters:  02/21/16 129/82  08/01/15 145/85  11/27/14 131/78    Wt Readings from Last 3 Encounters:  02/21/16 176 lb 6.4 oz (80.015 kg)  08/01/15 184 lb (83.462 kg)  11/27/14 184 lb (83.462 kg)      Physical Exam  Constitutional: He is oriented to person, place, and time. He appears well-developed and well-nourished. No distress.  HENT:  Head: Normocephalic and atraumatic.  Right Ear: External ear normal.  Left Ear: External ear normal.  Nose: Nose normal.  Mouth/Throat: Oropharynx is clear and moist.  Eyes: Conjunctivae and EOM are normal. Pupils are equal, round, and reactive to light.  Neck: Normal range of motion. Neck supple. No thyromegaly present.  Cardiovascular: Normal rate, regular rhythm and normal heart sounds.   No murmur heard. Pulmonary/Chest: Effort normal and breath sounds normal. No respiratory distress. He has no wheezes. He has no rales.  Abdominal: Soft. Bowel sounds are normal. He exhibits no distension. There is no tenderness.  Genitourinary: Rectum normal.  Prostate enlarged, smooth  Musculoskeletal: Normal range of motion. He exhibits no edema or tenderness.  Lymphadenopathy:    He has no cervical adenopathy.  Neurological: He is alert and oriented to person, place, and time. He has normal reflexes.  Skin: Skin is warm and dry.  Psychiatric: He has a normal mood and affect. His behavior is normal. Judgment and thought content normal.     Lab Results  Component Value Date   WBC 6.3 10/04/2011   HGB 15.9 10/04/2011   HCT 48.6 10/04/2011   PLT 200 10/04/2011   GLUCOSE 101* 10/04/2011   ALT 25 10/04/2011   AST 24 10/04/2011   NA 137 10/04/2011   K 4.5 10/04/2011   CL 99 10/04/2011   CREATININE 0.83 10/04/2011   BUN 12 10/04/2011   CO2 31 10/04/2011    Dg Foot Complete Right  11/27/2014  CLINICAL DATA:  Right foot pain/swelling/redness EXAM: RIGHT FOOT COMPLETE - 3+ VIEW COMPARISON:  None. FINDINGS: No fracture or dislocation is seen. The joint spaces are preserved. The visualized soft tissues are unremarkable. IMPRESSION: No acute osseous abnormality is seen. Electronically Signed   By: Julian Hy M.D.   On: 11/27/2014 13:41     Assessment & Plan:   Randall Sullivan was seen today for hypertension.  Diagnoses and all orders for this visit:  Pain in joint, ankle and foot, unspecified laterality -     DG Foot Complete Left; Future -     DG Foot Complete Right; Future -     CMP14+EGFR  BPH (benign prostatic hyperplasia) -     CMP14+EGFR  Essential (primary) hypertension -     CBC with Differential/Platelet -     CMP14+EGFR  Primary osteoarthritis involving multiple joints -     CMP14+EGFR  Midline low back pain without sciatica -     DG Lumbar Spine 2-3 Views; Future -     CMP14+EGFR  COPD mixed type (HCC) -     PR BREATHING CAPACITY TEST  Hemorrhoids, unspecified hemorrhoid type -     Ambulatory referral to General Surgery  Special screening for malignant neoplasms, colon -     Ambulatory referral to Gastroenterology  Other orders -     celecoxib (CELEBREX) 200 MG  capsule; Take 1 capsule (200 mg total) by mouth daily. With food for foot and joint pain -     tamsulosin (FLOMAX) 0.4 MG CAPS capsule; Take 2 capsules (0.8 mg total) by mouth daily after supper. For urine flow and prostate   I have discontinued Mr. Zara's amoxicillin. I am also having him start on celecoxib and tamsulosin. Additionally, I am having him maintain his acetaminophen, fexofenadine, albuterol, hydrocortisone, Lidocaine (Anorectal), lisinopril, albuterol, and fluticasone.  Meds ordered this encounter  Medications  . fluticasone (FLONASE) 50 MCG/ACT nasal spray    Sig:   . celecoxib (CELEBREX) 200 MG capsule    Sig: Take 1 capsule (200 mg total) by mouth daily. With food for foot and joint pain    Dispense:  30 capsule    Refill:  5  . tamsulosin (FLOMAX) 0.4 MG CAPS capsule    Sig: Take 2 capsules (0.8 mg total) by mouth daily after supper. For urine flow and prostate    Dispense:  60 capsule    Refill:  5     Follow-up: Return in about 6 months (around 08/23/2016) for CPE.  Claretta Fraise, M.D.

## 2016-02-28 ENCOUNTER — Other Ambulatory Visit: Payer: Self-pay | Admitting: Family Medicine

## 2016-03-22 ENCOUNTER — Other Ambulatory Visit: Payer: Self-pay | Admitting: Family Medicine

## 2016-03-26 ENCOUNTER — Other Ambulatory Visit: Payer: Self-pay | Admitting: Family Medicine

## 2016-03-31 ENCOUNTER — Other Ambulatory Visit: Payer: Self-pay | Admitting: Family Medicine

## 2016-04-23 ENCOUNTER — Other Ambulatory Visit: Payer: Self-pay | Admitting: Family Medicine

## 2016-04-25 ENCOUNTER — Other Ambulatory Visit: Payer: Self-pay | Admitting: Family Medicine

## 2016-05-08 ENCOUNTER — Telehealth: Payer: Self-pay | Admitting: Family Medicine

## 2016-05-08 NOTE — Telephone Encounter (Signed)
He needs an appt for that

## 2016-05-08 NOTE — Telephone Encounter (Signed)
Patient aware and verbalize understanding.

## 2016-05-19 ENCOUNTER — Other Ambulatory Visit: Payer: Self-pay | Admitting: Family Medicine

## 2016-05-25 ENCOUNTER — Other Ambulatory Visit: Payer: Self-pay | Admitting: Family Medicine

## 2016-06-01 ENCOUNTER — Emergency Department (HOSPITAL_COMMUNITY)
Admission: EM | Admit: 2016-06-01 | Discharge: 2016-06-01 | Disposition: A | Discharge status: Home / Self Care | Payer: Medicare Other | Attending: Emergency Medicine | Admitting: Emergency Medicine

## 2016-06-01 ENCOUNTER — Encounter (HOSPITAL_COMMUNITY): Payer: Self-pay

## 2016-06-01 ENCOUNTER — Emergency Department (HOSPITAL_COMMUNITY): Payer: Medicare Other

## 2016-06-01 DIAGNOSIS — I10 Essential (primary) hypertension: Secondary | ICD-10-CM | POA: Insufficient documentation

## 2016-06-01 DIAGNOSIS — Z79899 Other long term (current) drug therapy: Secondary | ICD-10-CM | POA: Insufficient documentation

## 2016-06-01 DIAGNOSIS — R059 Cough, unspecified: Secondary | ICD-10-CM

## 2016-06-01 DIAGNOSIS — R918 Other nonspecific abnormal finding of lung field: Secondary | ICD-10-CM | POA: Insufficient documentation

## 2016-06-01 DIAGNOSIS — R05 Cough: Secondary | ICD-10-CM | POA: Diagnosis not present

## 2016-06-01 DIAGNOSIS — F17210 Nicotine dependence, cigarettes, uncomplicated: Secondary | ICD-10-CM | POA: Diagnosis not present

## 2016-06-01 MED ORDER — HYDROCOD POLST-CPM POLST ER 10-8 MG/5ML PO SUER: 5.0000 mL | Freq: Two times a day (BID) | ORAL | 0 refills | Status: DC | PRN

## 2016-06-01 NOTE — ED Provider Notes (Signed)
Randall Sullivan Provider Note   CSN: [id number] Arrival date & time: 06/01/16  1016     History   Chief Complaint Chief Complaint  Patient presents with  . Cough    HPI Randall Sullivan is a 65 y.o. male 1ppd smoker, history of htn and emphysema, presenting with a one week history of worsening cough which has been productive of green tinged sputum in association with post nasal drip and burning midsternal chest  And throat pain with cough episodes. He reports history of emphysema and has frequent cough at baseline.  He denies sob, hemoptysis, fevers, chills, nausea, sinus pain. He has taken allegra since yesterday with no improvement in the post nasal drip.    The history is provided by the patient.    Past Medical History:  Diagnosis Date  . Emphysema   . Hypertension     Patient Active Problem List   Diagnosis Date Noted  . Essential (primary) hypertension 10/06/2014    Past Surgical History:  Procedure Laterality Date  . HERNIA REPAIR         Home Medications    Prior to Admission medications   Medication Sig Start Date End Date Taking? Authorizing Provider  acetaminophen (TYLENOL) 500 MG tablet Take 500 mg by mouth every 6 (six) hours as needed for mild pain. For pain    Yes Historical Provider, MD  albuterol (PROVENTIL) (2.5 MG/3ML) 0.083% nebulizer solution USE 1 VIAL PER NEBULIZATION FOUR TIMES DAILY AS NEEDED 04/26/16  Yes Claretta Fraise, MD  lisinopril (PRINIVIL,ZESTRIL) 20 MG tablet TAKE 1 TABLET (20 MG TOTAL) BY MOUTH DAILY. 05/20/16  Yes Claretta Fraise, MD  PROAIR HFA [phone] Base) MCG/ACT inhaler INHALE 2 PUFFS INTO THE LUNGS EVERY SIX HOURS AS NEEDED FOR SHORTNESS OF BREATH 05/27/16  Yes Claretta Fraise, MD  celecoxib (CELEBREX) 200 MG capsule Take 1 capsule (200 mg total) by mouth daily. With food for foot and joint pain Patient not taking: Reported on 06/01/2016 02/21/16   Claretta Fraise, MD  chlorpheniramine-HYDROcodone [facility] ER) 10-8 MG/5ML SUER  Take 5 mLs by mouth every 12 (twelve) hours as needed for cough. 06/01/16   Evalee Jefferson, PA-C  fexofenadine (ALLEGRA) 180 MG tablet Take 1 tablet (180 mg total) by mouth daily. Patient not taking: Reported on 06/01/2016 08/01/15   Claretta Fraise, MD  hydrocortisone (ANUSOL-HC) 25 MG suppository Place 1 suppository (25 mg total) rectally 2 (two) times daily. Patient not taking: Reported on 06/01/2016 11/16/15   Timmothy Euler, MD  Lidocaine, Anorectal, 5 % CREA Apply to the rectum up to 6 times daily Patient not taking: Reported on 06/01/2016 11/16/15   Timmothy Euler, MD  tamsulosin (FLOMAX) 0.4 MG CAPS capsule Take 2 capsules (0.8 mg total) by mouth daily after supper. For urine flow and prostate Patient not taking: Reported on 06/01/2016 02/21/16   Claretta Fraise, MD    Family History Family History  Problem Relation Age of Onset  . Cancer Mother   . Cancer Sister   . Aneurysm Sister   . Kidney disease Paternal Aunt     Social History Social History  Substance Use Topics  . Smoking status: Current Every Day Smoker    Packs/day: 0.50    Types: Cigarettes  . Smokeless tobacco: Never Used  . Alcohol use No     Allergies   Review of patient's allergies indicates no known allergies.   Review of Systems Review of Systems  Constitutional: Negative for fever.  HENT: Positive for congestion  and postnasal drip. Negative for sore throat.   Eyes: Negative.   Respiratory: Positive for cough. Negative for choking, chest tightness, shortness of breath, wheezing and stridor.   Cardiovascular: Negative for chest pain.  Gastrointestinal: Negative for abdominal pain and nausea.  Genitourinary: Negative.   Musculoskeletal: Negative for arthralgias, joint swelling and neck pain.  Skin: Negative.  Negative for rash and wound.  Neurological: Negative for dizziness, weakness, light-headedness, numbness and headaches.  Psychiatric/Behavioral: Negative.      Physical Exam Updated Vital Signs BP  116/78 (BP Location: Right Arm)   Pulse 76   Temp 98.1 F (36.7 C) (Oral)   Resp 17   Ht '5\' 10"'$  (1.778 m)   Wt 81.6 kg   SpO2 100%   BMI 25.83 kg/m   Physical Exam  Constitutional: He appears well-developed and well-nourished.  HENT:  Head: Normocephalic and atraumatic.  Eyes: Conjunctivae are normal.  Neck: Normal range of motion.  Cardiovascular: Normal rate, regular rhythm, normal heart sounds and intact distal pulses.   Pulmonary/Chest: Effort normal. He has decreased breath sounds in the left lower field. He has no wheezes. He has no rhonchi.  Intermittent crackle left base.  Abdominal: Soft. Bowel sounds are normal. There is no tenderness.  Musculoskeletal: Normal range of motion.  Neurological: He is alert.  Skin: Skin is warm and dry.  Psychiatric: He has a normal mood and affect.  Nursing note and vitals reviewed.    ED Treatments / Results  Labs (all labs ordered are listed, but only abnormal results are displayed) Labs Reviewed - No data to display  EKG  EKG Interpretation None       Radiology Dg Chest 2 View  Result Date: 06/01/2016 CLINICAL DATA:  Cough for 3-4 days. EXAM: CHEST  2 VIEW COMPARISON:  Chest x-ray and chest CT from September 2011 FINDINGS: The cardiac silhouette, mediastinal and hilar contours are within normal limits and stable. Stable emphysematous changes and pulmonary scarring. There is a irregular spiculated appearing left upper lobe nodule. This appears to be slightly inferior and medial to the prior left upper lobe nodule. Recommend chest CT with contrast for further evaluation. No definite infiltrates or effusions. The bony thorax is intact. IMPRESSION: 1. Suspect new (rather than enlarging) left upper lobe pulmonary lesion. Recommend chest CT for further evaluation. **An incidental finding of potential clinical significance has been found. New left upper lobe pulmonary nodule since 2011. Chest CT suggested for further evaluation.** 2.  Stable emphysematous changes. 3. No definite infiltrates or effusion. Electronically Signed   By: Marijo Sanes M.D.   On: 06/01/2016 10:52   Ct Chest Wo Contrast  Result Date: 06/01/2016 CLINICAL DATA:  Abnormal chest radiograph, evaluate pulmonary nodule EXAM: CT CHEST WITHOUT CONTRAST TECHNIQUE: Multidetector CT imaging of the chest was performed following the standard protocol without IV contrast. COMPARISON:  Chest radiographs dated 06/01/2016 FINDINGS: Cardiovascular: The heart is normal in size. No pericardial effusion. Coronary atherosclerosis in the LAD and right coronary artery. No evidence of thoracic aortic aneurysm. Mediastinum/Nodes: Small mediastinal lymph nodes, including an 8 mm short axis AP window node (series 2/ image 65), within the upper limits of normal. Visualized thyroid is unremarkable. Lungs/Pleura: 3.8 x 1.2 x 2.5 cm spiculated left upper lobe mass (series 3/ image 45), corresponding to radiographic abnormality, suspicious for primary bronchogenic neoplasm. Moderate paraseptal emphysematous changes. Mild bronchial wall thickening with mucous plugging in the right middle lobe (series 3/ image 103). Additional scattered areas of bronchial wall thickening. No  focal consolidation.  No pleural effusion or pneumothorax. Upper Abdomen: Visualized upper abdomen is unremarkable. Musculoskeletal: Mild degenerative changes of the visualized thoracolumbar spine. IMPRESSION: 3.8 cm spiculated left upper lobe mass, corresponding to the radiographic abnormality, suspicious for primary bronchogenic neoplasm. Small mediastinal lymph nodes measuring up to 8 mm short axis, within the upper limits of normal. Consider percutaneous sampling for tissue confirmation. PET-CT may be beneficial for staging as clinically warranted. Electronically Signed   By: Julian Hy M.D.   On: 06/01/2016 12:29    Procedures Procedures (including critical care time)  Medications Ordered in ED Medications - No  data to display   Initial Impression / Assessment and Plan / ED Course  I have reviewed the triage vital signs and the nursing notes.  Pertinent labs & imaging results that were available during my care of the patient were reviewed by me and considered in my medical decision making (see chart for details).  Clinical Course    Discussed results of imaging with pt and wife.  Advised he needs to re-establish care with Dr. Arlyce Dice (or Dr. Servando Snare who is on call today). Pt understands to call for appt within the next week.  He has routine scheduled appt with his pcp in 10 days also.  tussionex prescribed for cough and pain. Advised to to continue allegra for PND.   Final Clinical Impressions(s) / ED Diagnoses   Final diagnoses:  Lung mass  Cough    New Prescriptions Discharge Medication List as of 06/01/2016  1:20 PM    START taking these medications   Details  chlorpheniramine-HYDROcodone (TUSSIONEX PENNKINETIC ER) 10-8 MG/5ML SUER Take 5 mLs by mouth every 12 (twelve) hours as needed for cough., Starting Sat 06/01/2016, Print         Evalee Jefferson, PA-C 06/01/16 1439    Milton Ferguson, MD 06/02/16 [phone]

## 2016-06-01 NOTE — ED Notes (Signed)
Patient transported to CT 

## 2016-06-01 NOTE — ED Notes (Signed)
Pt returned from CT °

## 2016-06-01 NOTE — ED Triage Notes (Signed)
Pt reports productive cough with green sputum  And burning in throat x 1 week.  Denies fever.

## 2016-06-04 ENCOUNTER — Telehealth: Payer: Self-pay | Admitting: Family Medicine

## 2016-06-04 NOTE — Telephone Encounter (Signed)
Patient has an appt to see Dr. Livia Snellen tomorrow 06/05/16.

## 2016-06-05 ENCOUNTER — Ambulatory Visit (INDEPENDENT_AMBULATORY_CARE_PROVIDER_SITE_OTHER): Payer: Medicare Other | Admitting: Family Medicine

## 2016-06-05 ENCOUNTER — Encounter: Payer: Self-pay | Admitting: Family Medicine

## 2016-06-05 VITALS — BP 151/88 | HR 68 | Temp 97.5°F | Ht 70.0 in | Wt 175.1 lb

## 2016-06-05 DIAGNOSIS — R918 Other nonspecific abnormal finding of lung field: Secondary | ICD-10-CM

## 2016-06-05 DIAGNOSIS — J0100 Acute maxillary sinusitis, unspecified: Secondary | ICD-10-CM

## 2016-06-05 MED ORDER — AMOXICILLIN-POT CLAVULANATE 875-125 MG PO TABS
1.0000 | ORAL_TABLET | Freq: Two times a day (BID) | ORAL | 0 refills | Status: DC
Start: 2016-06-05 — End: 2016-06-21

## 2016-06-05 NOTE — Progress Notes (Signed)
Subjective:  Patient ID: Randall Sullivan, male    DOB: [birth year]  Age: 65 y.o. MRN: [id number]  CC: Hospitalization Follow-up (pt was seen at [facility] this past Saturday for sinus trouble and had a CT and would like to discuss the results)   HPI Randall Sullivan presents for Ongoing problems with his sinuses. He has been using over-the-counter medicines and cough medicines without relief. He is concerned about the preliminary report he received from the CT scan of his chest. CT of the chest was done based on an apparently abnormal chest x-ray which was done due to some cough related to the sinuses. Apparently there is a lesion on the chest x-ray and a that was confirmed on the CT scan. The CT scan report was reviewed and is attached. I limits of that report are reproduced in bold print below This showed a spiculated left upper lobe lesion that was reported as suspicious for bronchogenic carcinoma. PET scan was recommended. The patient was unaware of this and is here for explanation and review. He does continue to smoke. He has a history of emphysema.   History Randall Sullivan has a past medical history of Emphysema and Hypertension.   He has a past surgical history that includes Hernia repair.   His family history includes Aneurysm in his sister; Cancer in his mother and sister; Kidney disease in his paternal aunt.He reports that he has been smoking Cigarettes.  He has been smoking about 0.50 packs per day. He has never used smokeless tobacco. He reports that he does not drink alcohol or use drugs.    ROS Review of Systems  Constitutional: Negative for chills, diaphoresis and fever.  HENT: Negative for rhinorrhea and sore throat.   Respiratory: Negative for cough and shortness of breath.   Cardiovascular: Negative for chest pain.  Gastrointestinal: Negative for abdominal pain.  Musculoskeletal: Negative for arthralgias and myalgias.  Skin: Negative for rash.  Neurological: Negative for weakness and  headaches.    Objective:  BP (!) 151/88   Pulse 68   Temp 97.5 F (36.4 C) (Oral)   Ht '5\' 10"'$  (1.778 m)   Wt 175 lb 2 oz (79.4 kg)   BMI 25.13 kg/m   BP Readings from Last 3 Encounters:  06/05/16 (!) 151/88  06/01/16 116/78  02/21/16 129/82    Wt Readings from Last 3 Encounters:  06/05/16 175 lb 2 oz (79.4 kg)  06/01/16 180 lb (81.6 kg)  02/21/16 176 lb 6.4 oz (80 kg)     Physical Exam  Constitutional: He is oriented to person, place, and time. He appears well-developed and well-nourished.  HENT:  Head: Normocephalic and atraumatic.  Right Ear: Tympanic membrane and external ear normal. No decreased hearing is noted.  Left Ear: Tympanic membrane and external ear normal. No decreased hearing is noted.  Nose: Mucosal edema present. Right sinus exhibits no frontal sinus tenderness. Left sinus exhibits no frontal sinus tenderness.  Mouth/Throat: No oropharyngeal exudate or posterior oropharyngeal erythema.  Eyes: Pupils are equal, round, and reactive to light.  Neck: Normal range of motion. Neck supple. No Brudzinski's sign noted.  Cardiovascular: Normal rate and regular rhythm.   No murmur heard. Pulmonary/Chest: Breath sounds normal. No respiratory distress.  Lymphadenopathy:       Head (right side): No preauricular adenopathy present.       Head (left side): No preauricular adenopathy present.       Right cervical: No superficial cervical adenopathy present.      Left  cervical: No superficial cervical adenopathy present.  Neurological: He is alert and oriented to person, place, and time.  Vitals reviewed.    Lab Results  Component Value Date   WBC 6.3 10/04/2011   HGB 15.9 10/04/2011   HCT 48.6 10/04/2011   PLT 200 10/04/2011   GLUCOSE 101 (H) 10/04/2011   ALT 25 10/04/2011   AST 24 10/04/2011   NA 137 10/04/2011   K 4.5 10/04/2011   CL 99 10/04/2011   CREATININE 0.83 10/04/2011   BUN 12 10/04/2011   CO2 31 10/04/2011    Dg Chest 2 View  Result Date:  06/01/2016 CLINICAL DATA:  Cough for 3-4 days. EXAM: CHEST  2 VIEW COMPARISON:  Chest x-ray and chest CT from September 2011 FINDINGS: The cardiac silhouette, mediastinal and hilar contours are within normal limits and stable. Stable emphysematous changes and pulmonary scarring. There is a irregular spiculated appearing left upper lobe nodule. This appears to be slightly inferior and medial to the prior left upper lobe nodule. Recommend chest CT with contrast for further evaluation. No definite infiltrates or effusions. The bony thorax is intact. IMPRESSION: 1. Suspect new (rather than enlarging) left upper lobe pulmonary lesion. Recommend chest CT for further evaluation. **An incidental finding of potential clinical significance has been found. New left upper lobe pulmonary nodule since 2011. Chest CT suggested for further evaluation.** 2. Stable emphysematous changes. 3. No definite infiltrates or effusion. Electronically Signed   By: Marijo Sanes M.D.   On: 06/01/2016 10:52   Ct Chest Wo Contrast  Result Date: 06/01/2016 CLINICAL DATA:  Abnormal chest radiograph, evaluate pulmonary nodule EXAM: CT CHEST WITHOUT CONTRAST TECHNIQUE: Multidetector CT imaging of the chest was performed following the standard protocol without IV contrast. COMPARISON:  Chest radiographs dated 06/01/2016 FINDINGS: Cardiovascular: The heart is normal in size. No pericardial effusion. Coronary atherosclerosis in the LAD and right coronary artery. No evidence of thoracic aortic aneurysm. Mediastinum/Nodes: Small mediastinal lymph nodes, including an 8 mm short axis AP window node (series 2/ image 65), within the upper limits of normal. Visualized thyroid is unremarkable. Lungs/Pleura: 3.8 x 1.2 x 2.5 cm spiculated left upper lobe mass (series 3/ image 45), corresponding to radiographic abnormality, suspicious for primary bronchogenic neoplasm. Moderate paraseptal emphysematous changes. Mild bronchial wall thickening with mucous plugging  in the right middle lobe (series 3/ image 103). Additional scattered areas of bronchial wall thickening. No focal consolidation.  No pleural effusion or pneumothorax. Upper Abdomen: Visualized upper abdomen is unremarkable. Musculoskeletal: Mild degenerative changes of the visualized thoracolumbar spine. IMPRESSION: 3.8 cm spiculated left upper lobe mass, corresponding to the radiographic abnormality, suspicious for primary bronchogenic neoplasm. Small mediastinal lymph nodes measuring up to 8 mm short axis, within the upper limits of normal. Consider percutaneous sampling for tissue confirmation. PET-CT may be beneficial for staging as clinically warranted. Electronically Signed   By: Julian Hy M.D.   On: 06/01/2016 12:29    Assessment & Plan:   Alvan was seen today for hospitalization follow-up.  Diagnoses and all orders for this visit:  Lung mass -     Ambulatory referral to Pulmonology  Acute maxillary sinusitis, recurrence not specified  Other orders -     amoxicillin-clavulanate (AUGMENTIN) 875-125 MG tablet; Take 1 tablet by mouth 2 (two) times daily. Take all of this medication    The patient was informed of the findings and the potential for this being a cancerous lesion. After discussion he agrees to pulmonary referral for definitive evaluation including  biopsy and referral for chemotherapy/radiation/surgery.  I am having Randall Sullivan maintain his acetaminophen, fexofenadine, hydrocortisone, Lidocaine (Anorectal), celecoxib, tamsulosin, albuterol, lisinopril, PROAIR HFA, and chlorpheniramine-HYDROcodone.  No orders of the defined types were placed in this encounter.    Follow-up: Return for COPD and lung mass after release by pulmonary.  Claretta Fraise, M.D.

## 2016-06-11 ENCOUNTER — Ambulatory Visit (INDEPENDENT_AMBULATORY_CARE_PROVIDER_SITE_OTHER): Payer: Medicare Other | Admitting: Pulmonary Disease

## 2016-06-11 ENCOUNTER — Encounter: Payer: Self-pay | Admitting: Family Medicine

## 2016-06-11 ENCOUNTER — Encounter (INDEPENDENT_AMBULATORY_CARE_PROVIDER_SITE_OTHER): Payer: Self-pay

## 2016-06-11 ENCOUNTER — Encounter: Payer: Self-pay | Admitting: Pulmonary Disease

## 2016-06-11 VITALS — BP 146/78 | HR 86 | Ht 70.0 in | Wt 174.6 lb

## 2016-06-11 DIAGNOSIS — J432 Centrilobular emphysema: Secondary | ICD-10-CM | POA: Diagnosis not present

## 2016-06-11 DIAGNOSIS — R918 Other nonspecific abnormal finding of lung field: Secondary | ICD-10-CM | POA: Diagnosis not present

## 2016-06-11 DIAGNOSIS — R911 Solitary pulmonary nodule: Secondary | ICD-10-CM

## 2016-06-11 DIAGNOSIS — Z87891 Personal history of nicotine dependence: Secondary | ICD-10-CM | POA: Insufficient documentation

## 2016-06-11 DIAGNOSIS — Z720 Tobacco use: Secondary | ICD-10-CM

## 2016-06-11 DIAGNOSIS — J439 Emphysema, unspecified: Secondary | ICD-10-CM | POA: Insufficient documentation

## 2016-06-11 DIAGNOSIS — F17210 Nicotine dependence, cigarettes, uncomplicated: Secondary | ICD-10-CM

## 2016-06-11 DIAGNOSIS — J449 Chronic obstructive pulmonary disease, unspecified: Secondary | ICD-10-CM | POA: Insufficient documentation

## 2016-06-11 NOTE — Assessment & Plan Note (Signed)
He has significant bullous emphysema.  Plan: PFT Quit smoking Will likely need bronchodilators in addition to albuterol, but will hold off until we see the results of the PFT

## 2016-06-11 NOTE — Patient Instructions (Signed)
We will refer you to thoracic surgery for evaluation for a biopsy of this pulmonary nodule and perhaps the mediastinal lymph node We will arrange for a lung function test and a PET scan We will see you back in one month or sooner if needed Quit smoking

## 2016-06-11 NOTE — Assessment & Plan Note (Signed)
This nodule is greater than 3 cm in size and spiculated, it's worrisome for malignancy given its appearance and his smoking history. Based on the surrounding bullous emphysema he is not a good candidate for transthoracic needle aspiration.  Because our partner who performs navigational bronchoscopies is out of town and I am going to refer him to thoracic surgery for evaluation of a navigational biopsy plus minus fine-needle aspiration of the pretracheal lymph node.  Plan: Referral to thoracic surgery PET scan PFT

## 2016-06-11 NOTE — Assessment & Plan Note (Signed)
Counseled at length to quit smoking today. 

## 2016-06-11 NOTE — Progress Notes (Signed)
Subjective:    Patient ID: Randall Sullivan, male    DOB: 1951-02-24, 65 y.o.   MRN: [id number]  HPI Chief Complaint  Patient presents with  . Advice Only    referred by Dr. Quinn Axe for lung mass.     Mr. Jaskiewicz was referred to me for a lung mass.  He recently went to the ER because he was concerned that his sinus infection was "turning into pneumonia".  He was noted to have a lung mass on a CXR and he had CT scan that showed a left upper lobe nodule.  He was diagnosed with emphysema in 2003 or 2002.  He had a breathing test and a CXR.  In the last year his family says he has been having more trouble breathing.  Specifically more shortness of breath when the weather changes.  He has been coughing, specifically more in the last few weeks when he had the sinus infection.  He has not coughed up blood or had any chest pain.  He believes that his weight has been stable.  He doesn't really track his weight at home.  He has quit smoking this week.  He notes smoking for 35-40 years, 1ppd.  He has worked in [employer] his entire life.  He doesn't recall any recent changes in his work environment.  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  His 1/2 brother just died 2 years ago, he had lung cancer.  When he was a child he had breathing problems, he wonders if he had asthma.      Past Medical History:  Diagnosis Date  . Emphysema   . Hypertension      Family History  Problem Relation Age of Onset  . Cancer Mother   . Cancer Sister   . Aneurysm Sister   . Kidney disease Paternal Aunt   . Asthma Brother   . Cancer - Lung Brother      Social History   Social History  . Marital status: Married    Spouse name: N/A  . Number of children: N/A  . Years of education: N/A   Occupational History  . Not on file.   Social History Main Topics  . Smoking status: Former Smoker    Packs/day: 1.00    Years: 40.00    Types: Cigarettes    Quit date:  05/28/2016  . Smokeless tobacco: Never Used  . Alcohol use No  . Drug use: No  . Sexual activity: Not on file   Other Topics Concern  . Not on file   Social History Narrative  . No narrative on file     No Known Allergies   Outpatient Medications Prior to Visit  Medication Sig Dispense Refill  . acetaminophen (TYLENOL) 500 MG tablet Take 500 mg by mouth every 6 (six) hours as needed for mild pain. For pain     . albuterol (PROVENTIL) (2.5 MG/3ML) 0.083% nebulizer solution USE 1 VIAL PER NEBULIZATION FOUR TIMES DAILY AS NEEDED 75 mL 3  . amoxicillin-clavulanate (AUGMENTIN) 875-125 MG tablet Take 1 tablet by mouth 2 (two) times daily. Take all of this medication 20 tablet 0  . fexofenadine (ALLEGRA) 180 MG tablet Take 1 tablet (180 mg total) by mouth daily. (Patient taking differently: Take 180 mg by mouth daily as needed. ) 30 tablet 11  . hydrocortisone (ANUSOL-HC) 25 MG suppository Place 1 suppository (25 mg total) rectally 2 (two) times daily. 12 suppository 0  .  Lidocaine, Anorectal, 5 % CREA Apply to the rectum up to 6 times daily 45 g 0  . lisinopril (PRINIVIL,ZESTRIL) 20 MG tablet TAKE 1 TABLET (20 MG TOTAL) BY MOUTH DAILY. 30 tablet 1  . PROAIR HFA 108 (90 Base) MCG/ACT inhaler INHALE 2 PUFFS INTO THE LUNGS EVERY SIX HOURS AS NEEDED FOR SHORTNESS OF BREATH 8.5 Inhaler 2  . celecoxib (CELEBREX) 200 MG capsule Take 1 capsule (200 mg total) by mouth daily. With food for foot and joint pain (Patient not taking: Reported on 06/11/2016) 30 capsule 5  . chlorpheniramine-HYDROcodone (TUSSIONEX PENNKINETIC ER) 10-8 MG/5ML SUER Take 5 mLs by mouth every 12 (twelve) hours as needed for cough. (Patient not taking: Reported on 06/11/2016) 140 mL 0  . tamsulosin (FLOMAX) 0.4 MG CAPS capsule Take 2 capsules (0.8 mg total) by mouth daily after supper. For urine flow and prostate (Patient not taking: Reported on 06/11/2016) 60 capsule 5   No facility-administered medications prior to visit.        Review of Systems  Constitutional: Negative for fever and unexpected weight change.  HENT: Positive for congestion, postnasal drip and sinus pressure. Negative for dental problem, ear pain, nosebleeds, rhinorrhea, sneezing, sore throat and trouble swallowing.   Eyes: Negative for redness and itching.  Respiratory: Positive for cough. Negative for chest tightness, shortness of breath and wheezing.   Cardiovascular: Negative for palpitations and leg swelling.  Gastrointestinal: Negative for nausea and vomiting.  Genitourinary: Negative for dysuria.  Musculoskeletal: Negative for joint swelling.  Skin: Negative for rash.  Neurological: Negative for headaches.  Hematological: Does not bruise/bleed easily.  Psychiatric/Behavioral: Negative for dysphoric mood. The patient is not nervous/anxious.        Objective:   Physical Exam Vitals:   06/11/16 0902  BP: (!) 146/78  Pulse: 86  SpO2: 96%  Weight: 174 lb 9.6 oz (79.2 kg)  Height: '5\' 10"'$  (1.778 m)  RA  Gen: well appearing, no acute distress HENT: NCAT, OP clear, neck supple without masses Eyes: PERRL, EOMi Lymph: no cervical lymphadenopathy PULM: CTA B CV: RRR, no mgr, no JVD GI: BS+, soft, nontender, no hsm Derm: no rash or skin breakdown MSK: normal bulk and tone Neuro: A&Ox4, CN II-XII intact, strength 5/5 in all 4 extremities Psyche: normal mood and affect    Records from September 2017 ER visit reviewed were he was seen for cough with green tinged mucus and had an abnormal chest x-ray so he was sent to [place] for further evaluation.     Assessment & Plan:  Lung nodule This nodule is greater than 3 cm in size and spiculated, it's worrisome for malignancy given its appearance and his smoking history. Based on the surrounding bullous emphysema he is not a good candidate for transthoracic needle aspiration.  Because our partner who performs navigational bronchoscopies is out of town and I am going to refer him to  thoracic surgery for evaluation of a navigational biopsy plus minus fine-needle aspiration of the pretracheal lymph node.  Plan: Referral to thoracic surgery PET scan PFT  Cigarette smoker Counseled at length to quit smoking today.  Emphysema of lung ([place]) He has significant bullous emphysema.  Plan: PFT Quit smoking Will likely need bronchodilators in addition to albuterol, but will hold off until we see the results of the PFT    Current Outpatient Prescriptions:  .  acetaminophen (TYLENOL) 500 MG tablet, Take 500 mg by mouth every 6 (six) hours as needed for mild pain. For pain , Disp: ,  Rfl:  .  albuterol (PROVENTIL) (2.5 MG/3ML) 0.083% nebulizer solution, USE 1 VIAL PER NEBULIZATION FOUR TIMES DAILY AS NEEDED, Disp: 75 mL, Rfl: 3 .  amoxicillin-clavulanate (AUGMENTIN) 875-125 MG tablet, Take 1 tablet by mouth 2 (two) times daily. Take all of this medication, Disp: 20 tablet, Rfl: 0 .  fexofenadine (ALLEGRA) 180 MG tablet, Take 1 tablet (180 mg total) by mouth daily. (Patient taking differently: Take 180 mg by mouth daily as needed. ), Disp: 30 tablet, Rfl: 11 .  hydrocortisone (ANUSOL-HC) 25 MG suppository, Place 1 suppository (25 mg total) rectally 2 (two) times daily., Disp: 12 suppository, Rfl: 0 .  Lidocaine, Anorectal, 5 % CREA, Apply to the rectum up to 6 times daily, Disp: 45 g, Rfl: 0 .  lisinopril (PRINIVIL,ZESTRIL) 20 MG tablet, TAKE 1 TABLET (20 MG TOTAL) BY MOUTH DAILY., Disp: 30 tablet, Rfl: 1 .  PROAIR HFA 108 (90 Base) MCG/ACT inhaler, INHALE 2 PUFFS INTO THE LUNGS EVERY SIX HOURS AS NEEDED FOR SHORTNESS OF BREATH, Disp: 8.5 Inhaler, Rfl: 2

## 2016-06-13 ENCOUNTER — Telehealth: Payer: Self-pay | Admitting: Family Medicine

## 2016-06-13 NOTE — Telephone Encounter (Signed)
Please address

## 2016-06-14 NOTE — Telephone Encounter (Signed)
Okay to order nebulizer?  

## 2016-06-19 ENCOUNTER — Encounter (HOSPITAL_COMMUNITY)
Admission: RE | Admit: 2016-06-19 | Discharge: 2016-06-19 | Disposition: A | Discharge status: Home / Self Care | Payer: Medicare Other | Source: Ambulatory Visit | Attending: Pulmonary Disease | Admitting: Pulmonary Disease

## 2016-06-19 ENCOUNTER — Ambulatory Visit (HOSPITAL_COMMUNITY)
Admission: RE | Admit: 2016-06-19 | Discharge: 2016-06-19 | Disposition: A | Discharge status: Home / Self Care | Payer: Medicare Other | Source: Ambulatory Visit | Attending: Pulmonary Disease | Admitting: Pulmonary Disease

## 2016-06-19 ENCOUNTER — Other Ambulatory Visit: Payer: Self-pay | Admitting: *Deleted

## 2016-06-19 DIAGNOSIS — R942 Abnormal results of pulmonary function studies: Secondary | ICD-10-CM | POA: Diagnosis not present

## 2016-06-19 DIAGNOSIS — J439 Emphysema, unspecified: Secondary | ICD-10-CM | POA: Diagnosis not present

## 2016-06-19 DIAGNOSIS — R918 Other nonspecific abnormal finding of lung field: Secondary | ICD-10-CM | POA: Insufficient documentation

## 2016-06-19 DIAGNOSIS — F17210 Nicotine dependence, cigarettes, uncomplicated: Secondary | ICD-10-CM | POA: Insufficient documentation

## 2016-06-19 DIAGNOSIS — R911 Solitary pulmonary nodule: Secondary | ICD-10-CM | POA: Insufficient documentation

## 2016-06-19 LAB — PULMONARY FUNCTION TEST
DL/VA % PRED: 64 %
DL/VA: 2.98 ml/min/mmHg/L
DLCO UNC % PRED: 52 %
DLCO UNC: 17.03 ml/min/mmHg
FEF 25-75 POST: 0.57 L/s
FEF 25-75 PRE: 0.32 L/s
FEF2575-%Change-Post: 80 %
FEF2575-%PRED-POST: 21 %
FEF2575-%PRED-PRE: 11 %
FEV1-%CHANGE-POST: 21 %
FEV1-%Pred-Post: 37 %
FEV1-%Pred-Pre: 30 %
FEV1-Post: 1.25 L
FEV1-Pre: 1.03 L
FEV1FVC-%Change-Post: -9 %
FEV1FVC-%PRED-PRE: 49 %
FEV6-%CHANGE-POST: 18 %
FEV6-%PRED-POST: 63 %
FEV6-%Pred-Pre: 53 %
FEV6-Post: 2.76 L
FEV6-Pre: 2.32 L
FEV6FVC-%CHANGE-POST: -11 %
FEV6FVC-%Pred-Post: 77 %
FEV6FVC-%Pred-Pre: 86 %
FVC-%Change-Post: 34 %
FVC-%PRED-POST: 82 %
FVC-%Pred-Pre: 61 %
FVC-Post: 3.77 L
FVC-Pre: 2.81 L
POST FEV1/FVC RATIO: 33 %
PRE FEV1/FVC RATIO: 37 %
Post FEV6/FVC ratio: 73 %
Pre FEV6/FVC Ratio: 83 %
RV % pred: 261 %
RV: 6.2 L
TLC % pred: 141 %
TLC: 9.92 L

## 2016-06-19 LAB — GLUCOSE, CAPILLARY: Glucose-Capillary: 101 mg/dL — ABNORMAL HIGH (ref 65–99)

## 2016-06-19 MED ORDER — FLUDEOXYGLUCOSE F - 18 (FDG) INJECTION
8.6100 | Freq: Once | INTRAVENOUS | Status: AC | PRN
  Administered 2016-06-19: 8.61 via INTRAVENOUS

## 2016-06-19 MED ORDER — ALBUTEROL SULFATE (2.5 MG/3ML) 0.083% IN NEBU
2.5000 mg | INHALATION_SOLUTION | Freq: Once | RESPIRATORY_TRACT | Status: AC
  Administered 2016-06-19: 2.5 mg via RESPIRATORY_TRACT

## 2016-06-20 ENCOUNTER — Encounter: Payer: Medicare Other | Admitting: Thoracic Surgery (Cardiothoracic Vascular Surgery)

## 2016-06-21 ENCOUNTER — Other Ambulatory Visit: Payer: Self-pay | Admitting: *Deleted

## 2016-06-21 ENCOUNTER — Institutional Professional Consult (permissible substitution) (INDEPENDENT_AMBULATORY_CARE_PROVIDER_SITE_OTHER): Payer: Medicare Other | Admitting: Thoracic Surgery (Cardiothoracic Vascular Surgery)

## 2016-06-21 ENCOUNTER — Other Ambulatory Visit: Payer: Self-pay | Admitting: Family Medicine

## 2016-06-21 ENCOUNTER — Encounter: Payer: Self-pay | Admitting: Thoracic Surgery (Cardiothoracic Vascular Surgery)

## 2016-06-21 VITALS — BP 144/80 | HR 92 | Ht 70.0 in | Wt 174.0 lb

## 2016-06-21 DIAGNOSIS — R911 Solitary pulmonary nodule: Secondary | ICD-10-CM

## 2016-06-21 DIAGNOSIS — R918 Other nonspecific abnormal finding of lung field: Secondary | ICD-10-CM

## 2016-06-21 NOTE — Progress Notes (Signed)
6 Minute Walk Test Results  Patient: Randall Sullivan Date:  06/21/2016   Supplemental O2 during test? no      Baseline   End  Time   245pm         251pm Heartrate  92    100 Dyspnea  no    no Fatigue  no    no O2 sat   96%    96% Blood pressure 144/80    143/89   Patient ambulated at a steady pace for a total distance of 1090 feet with no stops.  Ambulation was limited primarily due to none  Overall the test was tolerated very well.

## 2016-06-21 NOTE — Progress Notes (Signed)
PCP is Claretta Fraise, MD Referring Provider is Claretta Fraise, MD  Chief Complaint  Patient presents with  . Lung Mass    Surgical eval, PET Scan 06/19/16, Chest CT 06/01/16, PFT's 06/19/16    HPI: Mr. Shutes is sent for consultation regarding a left upper lobe nodule  Mr. Corbo is a 65 year old man with a 45-pack-year history of tobacco abuse, COPD, and hypertension who recently was found to have a left upper lobe nodule. In early September he had "sinus problem." He had nasal congestion sneezing and coughing. He was concerned that it could be turning into an pneumonia as he was feeling a little more wheezing and shortness of breath. A chest x-ray showed an abnormality. CT of the chest showed a left upper lobe nodule measuring 3.8 x 1.2 x 2.5 cm. There was some questionable adenopathy. A PET CT showed the left upper lobe nodule was hypermetabolic. There was no evidence of regional or distant metastases.  Mr. Cornia says that he quit smoking 2 weeks ago. He does have a cough. He also has wheezing usually when the humidity is high. His congestion postnasal drip have improved. He denies chest pain, pressure, or tightness. He says that he can walk up a flight of stairs without stopping. He has not had any change in appetite or weight loss. He denies unusual headaches or visual changes. He denies unusual bone or joint pain.   Zubrod Score: At the time of surgery this patient's most appropriate activity status/level should be described as: '[]'$     0    Normal activity, no symptoms '[x]'$     1    Restricted in physical strenuous activity but ambulatory, able to do out light work '[]'$     2    Ambulatory and capable of self care, unable to do work activities, up and about >50 % of waking hours                              '[]'$     3    Only limited self care, in bed greater than 50% of waking hours '[]'$     4    Completely disabled, no self care, confined to bed or chair '[]'$     5    Moribund  Past Medical History:   Diagnosis Date  . Emphysema   . Hypertension     Past Surgical History:  Procedure Laterality Date  . HERNIA REPAIR      Family History  Problem Relation Age of Onset  . Cancer Mother   . Cancer Sister   . Aneurysm Sister   . Kidney disease Paternal Aunt   . Asthma Brother   . Cancer - Lung Brother     Social History Social History  Substance Use Topics  . Smoking status: Former Smoker    Packs/day: 1.00    Years: 40.00    Types: Cigarettes    Quit date: 05/28/2016  . Smokeless tobacco: Never Used  . Alcohol use No    Current Outpatient Prescriptions  Medication Sig Dispense Refill  . acetaminophen (TYLENOL) 500 MG tablet Take 500 mg by mouth every 6 (six) hours as needed for mild pain. For pain     . albuterol (PROVENTIL) (2.5 MG/3ML) 0.083% nebulizer solution USE 1 VIAL PER NEBULIZATION FOUR TIMES DAILY AS NEEDED 75 mL 5  . fexofenadine (ALLEGRA) 180 MG tablet Take 1 tablet (180 mg total) by mouth daily. (Patient taking differently: Take  180 mg by mouth daily as needed. ) 30 tablet 11  . hydrocortisone (ANUSOL-HC) 25 MG suppository Place 1 suppository (25 mg total) rectally 2 (two) times daily. 12 suppository 0  . Lidocaine, Anorectal, 5 % CREA Apply to the rectum up to 6 times daily 45 g 0  . lisinopril (PRINIVIL,ZESTRIL) 20 MG tablet TAKE 1 TABLET (20 MG TOTAL) BY MOUTH DAILY. 30 tablet 1  . PROAIR HFA 108 (90 Base) MCG/ACT inhaler INHALE 2 PUFFS INTO THE LUNGS EVERY SIX HOURS AS NEEDED FOR SHORTNESS OF BREATH 8.5 Inhaler 2   No current facility-administered medications for this visit.     No Known Allergies  Review of Systems  Constitutional: Negative for activity change, appetite change, fatigue and unexpected weight change.  HENT: Positive for congestion, postnasal drip and sinus pressure. Negative for trouble swallowing and voice change.   Eyes: Negative for visual disturbance.  Respiratory: Positive for cough, shortness of breath and wheezing.    Cardiovascular: Negative for chest pain, palpitations and leg swelling.  Gastrointestinal: Negative for abdominal pain and blood in stool.  Genitourinary: Negative for difficulty urinating and dysuria.  Musculoskeletal: Negative for arthralgias, back pain and gait problem.  Neurological: Negative for syncope, speech difficulty and weakness.  Hematological: Negative for adenopathy. Does not bruise/bleed easily.  All other systems reviewed and are negative.   BP (!) 144/80   Pulse 92   Ht '5\' 10"'$  (1.778 m)   Wt 174 lb (78.9 kg)   SpO2 95% Comment: RA  BMI 24.97 kg/m  Physical Exam  Constitutional: He is oriented to person, place, and time. He appears well-developed and well-nourished. No distress.  HENT:  Head: Normocephalic and atraumatic.  Mouth/Throat: No oropharyngeal exudate.  Eyes: Conjunctivae and EOM are normal. No scleral icterus.  Neck: Neck supple. No thyromegaly present.  Cardiovascular: Normal rate, regular rhythm and normal heart sounds.   No murmur heard. Pulmonary/Chest: Effort normal. No respiratory distress. He has no wheezes. He has no rales.  Diminished breath sounds bilaterally  Abdominal: Soft. Bowel sounds are normal. He exhibits no distension. There is no tenderness.  Musculoskeletal: Normal range of motion. He exhibits no edema.  Lymphadenopathy:    He has no cervical adenopathy.  Neurological: He is alert and oriented to person, place, and time. No cranial nerve deficit.  No focal motor deficit  Skin: Skin is warm and dry.  Vitals reviewed.    Diagnostic Tests: CT CHEST WITHOUT CONTRAST  TECHNIQUE: Multidetector CT imaging of the chest was performed following the standard protocol without IV contrast.  COMPARISON:  Chest radiographs dated 06/01/2016  FINDINGS: Cardiovascular: The heart is normal in size. No pericardial effusion.  Coronary atherosclerosis in the LAD and right coronary artery.  No evidence of thoracic aortic  aneurysm.  Mediastinum/Nodes: Small mediastinal lymph nodes, including an 8 mm short axis AP window node (series 2/ image 65), within the upper limits of normal.  Visualized thyroid is unremarkable.  Lungs/Pleura: 3.8 x 1.2 x 2.5 cm spiculated left upper lobe mass (series 3/ image 45), corresponding to radiographic abnormality, suspicious for primary bronchogenic neoplasm.  Moderate paraseptal emphysematous changes.  Mild bronchial wall thickening with mucous plugging in the right middle lobe (series 3/ image 103). Additional scattered areas of bronchial wall thickening.  No focal consolidation.  No pleural effusion or pneumothorax.  Upper Abdomen: Visualized upper abdomen is unremarkable.  Musculoskeletal: Mild degenerative changes of the visualized thoracolumbar spine.  IMPRESSION: 3.8 cm spiculated left upper lobe mass, corresponding to the  radiographic abnormality, suspicious for primary bronchogenic neoplasm.  Small mediastinal lymph nodes measuring up to 8 mm short axis, within the upper limits of normal.  Consider percutaneous sampling for tissue confirmation. PET-CT may be beneficial for staging as clinically warranted.   Electronically Signed   By: Julian Hy M.D.   On: 06/01/2016 12:29 NUCLEAR MEDICINE PET SKULL BASE TO THIGH  TECHNIQUE: 8.6 mCi F-18 FDG was injected intravenously. Full-ring PET imaging was performed from the skull base to thigh after the radiotracer. CT data was obtained and used for attenuation correction and anatomic localization.  FASTING BLOOD GLUCOSE:  Value: 101 mg/dl  COMPARISON:  CT chest dated 06/02/2016.  PET-CT dated 07/03/2010.  FINDINGS: NECK  No hypermetabolic lymph nodes in the neck.  CHEST  3.3 x 1.4 cm subsolid left upper lobe pulmonary nodule with 2.6 cm spiculated solid component (series 8/image 20), max SUV 5.3, compatible with primary bronchogenic neoplasm. This has  progressed from 2011.  Underlying moderate paraseptal emphysematous changes, upper lobe predominant. No focal consolidation. No pleural effusion or pneumothorax.  The heart is normal in size. No pericardial effusion. Mild coronary atherosclerosis in the LAD and left circumflex.  No hypermetabolic thoracic lymphadenopathy.  ABDOMEN/PELVIS  No abnormal hypermetabolic activity within the liver, pancreas, adrenal glands, or spleen.  Mild atherosclerotic calcifications of the abdominal aorta and branch vessels. 3.3 x 3.4 cm infrarenal abdominal aortic aneurysm (series 4/ image 145). Small fat containing bilateral inguinal hernias.  No hypermetabolic lymph nodes in the abdomen or pelvis.  SKELETON  No focal hypermetabolic activity to suggest skeletal metastasis.  IMPRESSION: 3.3 x 1.4 cm hypermetabolic subsolid left upper lobe pulmonary nodule, compatible with primary bronchogenic neoplasm.  No findings suspicious for metastatic disease.   Electronically Signed   By: Julian Hy M.D.   On: 06/19/2016 11:58  I personally reviewed the CT chest and PET/CT and concur with the findings noted above.  Pulmonary function testing FVC 2.81 (61%) FEV1 1.03 (30%) FEV1 1.25 (37%) post bronchodilator DLCO 17.03 (52%)  Impression: Mr. Muldrow is a 65 year old woman with a long history of tobacco abuse (45 pack years) and COPD. He has been found to have a new lung nodule measuring 3.8 x 1.2 x 2.5 cm in the left upper lobe. This is in close proximity to emphysematous blebs. This is not approachable with a CT-guided biopsy due to the surrounding bullous emphysema. It is approachable with a navigational bronchoscopy, but with elevated risk of pneumothorax. I do think he would be best in his case to proceed with navigational bronchoscopy to biopsy the nodule before subjecting him to surgery.  I did have a long discussion with Mr. Mestre his family. They understand the most  likely scenario is that this is a new primary bronchogenic carcinoma. They understand that infectious or inflammatory nodules can also present in this fashion but are much less common. Based on his CT and PET/CT if this is cancer and likely is an early-stage lesion, clinical stage Ib.  We also discussed possible treatment strategy since this is a cancer. His PFTs are marginal but I think with a lingular sparing left upper lobectomy would primarily be removing destroyed lung and would not have a major impact on his respiratory capacity. I think the option of stereotactic radiation is also reasonable in his case, but I think an anatomic resection would offer a better chance for cure.  I described the proposed procedure of electromagnetic navigational bronchoscopy to Mr. Milanes and his family. They understand this  would be done in the operating room under general anesthesia. It would be done on an outpatient basis. I reviewed the indications, risks, benefits, and alternatives. They understand the risks include, but are not limited to those associated with general anesthesia, death, MI, bleeding, pneumothorax, and failure to make a diagnosis, as well as the possibility of other unforeseeable complications.  He understands and accepts the risks and agrees to proceed.  Plan: Electromagnetic navigational bronchoscopy for biopsy of left upper lobe nodule on Wednesday, 07/03/2016  [name], MD [facility] [phone]

## 2016-06-22 NOTE — Progress Notes (Signed)
thanks

## 2016-06-24 ENCOUNTER — Telehealth: Payer: Self-pay | Admitting: Family Medicine

## 2016-06-24 NOTE — Telephone Encounter (Signed)
Letter is ready for pick-up.

## 2016-06-24 NOTE — Telephone Encounter (Signed)
Will Fax letter to [place] [phone]

## 2016-06-24 NOTE — Telephone Encounter (Signed)
Please  write and I will sign. Thanks, WS 

## 2016-06-25 NOTE — Progress Notes (Signed)
Pt seen by [facility] 06/21/16

## 2016-06-26 ENCOUNTER — Telehealth: Payer: Self-pay

## 2016-06-26 NOTE — Telephone Encounter (Signed)
Patients wife called about the FMLA paperwork that was filled out and sent in to [facility] for her. She advised that she needs additional days added to it. She needs October 4-9 and return to work on Oct 10 as long as everything goes ok with husbands procedure. I contacted [employer] to find out exactly what they needed and lady there stated to send a work note with dates on it and they would add to her current FMLA. Printed letter and faxed to [employer]. Patient notified

## 2016-06-28 NOTE — Pre-Procedure Instructions (Addendum)
Randall Sullivan  06/28/2016      [facility] [address], [place] [address] [place] [address] Phone: [phone] Fax: [phone]  [id number] [id number]: [phone]: [phone]   Your procedure is scheduled on 07/03/16.  [facility]  Call this number if you have problems the morning of surgery:  [phone]   Remember:  Do not eat food or drink liquids after midnight.  Take these medicines the morning of surgery with A SIP OF WATER inhalers if needed(bring albuterol  With you),allegra if needed  STOP all herbel meds, nsaids (aleve,naproxen,advil,ibuprofen) NOW including vitamins, aspirin   Do not wear jewelry, make-up or nail polish.  Do not wear lotions, powders, or perfumes, or deoderant.  Do not shave 48 hours prior to surgery.  Men may shave face and neck.  Do not bring valuables to the hospital.  [facility] or [facility]ables.  Contacts, dentures or bridgework may not be worn into surgery.  Leave your suitcase in the car.  After surgery it may be brought to your room.  For patients admitted to the hospital, discharge time will be determined by your treatment team.  Patients discharged the day of surgery will not be allowed to drive home.   Name and phone number of your driver:    Special instructions:   Special Instructions: [facility] - Preparing for Surgery  Before surgery, you can play an important role.  Because skin is not sterile, your skin needs to be as free of germs as possible.  You can reduce the number of germs on you skin by washing with CHG (chlorahexidine gluconate) soap before surgery.  CHG is an antiseptic cleaner which kills germs and bonds with the skin to continue killing germs even after washing.  Please DO NOT use if you have  an allergy to CHG or antibacterial soaps.  If your skin becomes reddened/irritated stop using the CHG and inform your nurse when you arrive at Short Stay.  Do not shave (including legs and underarms) for at least 48 hours prior to the first CHG shower.  You may shave your face.  Please follow these instructions carefully:   1.  Shower with CHG Soap the night before surgery and the morning of Surgery.  2.  If you choose to wash your hair, wash your hair first as usual with your normal shampoo.  3.  After you shampoo, rinse your hair and body thoroughly to remove the Shampoo.  4.  Use CHG as you would any other liquid soap.  You can apply chg directly  to the skin and wash gently with scrungie or a clean washcloth.  5.  Apply the CHG Soap to your body ONLY FROM THE NECK DOWN.  Do not use on open wounds or open sores.  Avoid contact with your eyes ears, mouth and genitals (private parts).  Wash genitals (private parts)       with your normal soap.  6.  Wash thoroughly, paying special attention to the area where your surgery will be performed.  7.  Thoroughly rinse your body with warm water from the neck down.  8.  DO NOT shower/wash with your normal soap after using and rinsing off the CHG Soap.  9.  Pat yourself dry with a clean towel.  10.  Wear clean pajamas.            11.  Place clean sheets on your bed the night of your first shower and do not sleep with pets.  Day of Surgery  Do not apply any lotions/deodorants the morning of surgery.  Please wear clean clothes to the hospital/surgery center.  Please read over the  fact sheets that you were given.

## 2016-07-01 ENCOUNTER — Encounter (HOSPITAL_COMMUNITY)
Admission: RE | Admit: 2016-07-01 | Discharge: 2016-07-01 | Disposition: A | Discharge status: Home / Self Care | Payer: Medicare Other | Source: Ambulatory Visit | Attending: Thoracic Surgery (Cardiothoracic Vascular Surgery) | Admitting: Thoracic Surgery (Cardiothoracic Vascular Surgery)

## 2016-07-01 ENCOUNTER — Encounter (HOSPITAL_COMMUNITY): Payer: Self-pay

## 2016-07-01 ENCOUNTER — Ambulatory Visit (HOSPITAL_COMMUNITY)
Admission: RE | Admit: 2016-07-01 | Discharge: 2016-07-01 | Disposition: A | Discharge status: Home / Self Care | Payer: Medicare Other | Source: Ambulatory Visit | Attending: Thoracic Surgery (Cardiothoracic Vascular Surgery) | Admitting: Thoracic Surgery (Cardiothoracic Vascular Surgery)

## 2016-07-01 DIAGNOSIS — R911 Solitary pulmonary nodule: Secondary | ICD-10-CM | POA: Diagnosis not present

## 2016-07-01 DIAGNOSIS — Z01818 Encounter for other preprocedural examination: Secondary | ICD-10-CM | POA: Insufficient documentation

## 2016-07-01 DIAGNOSIS — Z01810 Encounter for preprocedural cardiovascular examination: Secondary | ICD-10-CM | POA: Insufficient documentation

## 2016-07-01 DIAGNOSIS — R918 Other nonspecific abnormal finding of lung field: Secondary | ICD-10-CM | POA: Insufficient documentation

## 2016-07-01 DIAGNOSIS — R001 Bradycardia, unspecified: Secondary | ICD-10-CM | POA: Insufficient documentation

## 2016-07-01 HISTORY — DX: Unspecified osteoarthritis, unspecified site: M19.90

## 2016-07-01 LAB — COMPREHENSIVE METABOLIC PANEL
ALT: 12 U/L — ABNORMAL LOW (ref 17–63)
AST: 18 U/L (ref 15–41)
Albumin: 4.2 g/dL (ref 3.5–5.0)
Alkaline Phosphatase: 51 U/L (ref 38–126)
Anion gap: 8 (ref 5–15)
BILIRUBIN TOTAL: 1.1 mg/dL (ref 0.3–1.2)
BUN: 11 mg/dL (ref 6–20)
CHLORIDE: 102 mmol/L (ref 101–111)
CO2: 27 mmol/L (ref 22–32)
CREATININE: 0.95 mg/dL (ref 0.61–1.24)
Calcium: 9.6 mg/dL (ref 8.9–10.3)
Glucose, Bld: 101 mg/dL — ABNORMAL HIGH (ref 65–99)
POTASSIUM: 4.8 mmol/L (ref 3.5–5.1)
SODIUM: 137 mmol/L (ref 135–145)
TOTAL PROTEIN: 7.4 g/dL (ref 6.5–8.1)

## 2016-07-01 LAB — CBC
HEMATOCRIT: 47.6 % (ref 39.0–52.0)
Hemoglobin: 15.1 g/dL (ref 13.0–17.0)
MCH: 27.9 pg (ref 26.0–34.0)
MCHC: 31.7 g/dL (ref 30.0–36.0)
MCV: 87.8 fL (ref 78.0–100.0)
PLATELETS: 240 10*3/uL (ref 150–400)
RBC: 5.42 MIL/uL (ref 4.22–5.81)
RDW: 14.3 % (ref 11.5–15.5)
WBC: 9.6 10*3/uL (ref 4.0–10.5)

## 2016-07-01 LAB — PROTIME-INR
INR: 1
PROTHROMBIN TIME: 13.2 s (ref 11.4–15.2)

## 2016-07-01 LAB — APTT: APTT: 29 s (ref 24–36)

## 2016-07-03 ENCOUNTER — Ambulatory Visit (HOSPITAL_COMMUNITY)
Admission: RE | Admit: 2016-07-03 | Discharge: 2016-07-03 | Disposition: A | Discharge status: Home / Self Care | Payer: Medicare Other | Source: Ambulatory Visit | Attending: Thoracic Surgery (Cardiothoracic Vascular Surgery) | Admitting: Thoracic Surgery (Cardiothoracic Vascular Surgery)

## 2016-07-03 ENCOUNTER — Ambulatory Visit (HOSPITAL_COMMUNITY): Payer: Medicare Other

## 2016-07-03 ENCOUNTER — Ambulatory Visit (HOSPITAL_COMMUNITY): Payer: Medicare Other | Admitting: Anesthesiology

## 2016-07-03 ENCOUNTER — Encounter (HOSPITAL_COMMUNITY)
Admission: RE | Disposition: A | Discharge status: Home / Self Care | Payer: Self-pay | Source: Ambulatory Visit | Attending: Thoracic Surgery (Cardiothoracic Vascular Surgery)

## 2016-07-03 ENCOUNTER — Encounter (HOSPITAL_COMMUNITY): Payer: Self-pay | Admitting: Anesthesiology

## 2016-07-03 DIAGNOSIS — R911 Solitary pulmonary nodule: Secondary | ICD-10-CM | POA: Diagnosis not present

## 2016-07-03 DIAGNOSIS — Z825 Family history of asthma and other chronic lower respiratory diseases: Secondary | ICD-10-CM | POA: Insufficient documentation

## 2016-07-03 DIAGNOSIS — M1990 Unspecified osteoarthritis, unspecified site: Secondary | ICD-10-CM | POA: Insufficient documentation

## 2016-07-03 DIAGNOSIS — I10 Essential (primary) hypertension: Secondary | ICD-10-CM | POA: Diagnosis not present

## 2016-07-03 DIAGNOSIS — C3412 Malignant neoplasm of upper lobe, left bronchus or lung: Secondary | ICD-10-CM | POA: Insufficient documentation

## 2016-07-03 DIAGNOSIS — Z87891 Personal history of nicotine dependence: Secondary | ICD-10-CM | POA: Diagnosis not present

## 2016-07-03 DIAGNOSIS — J984 Other disorders of lung: Secondary | ICD-10-CM | POA: Diagnosis not present

## 2016-07-03 DIAGNOSIS — Z419 Encounter for procedure for purposes other than remedying health state, unspecified: Secondary | ICD-10-CM

## 2016-07-03 DIAGNOSIS — Z8489 Family history of other specified conditions: Secondary | ICD-10-CM | POA: Diagnosis not present

## 2016-07-03 DIAGNOSIS — J449 Chronic obstructive pulmonary disease, unspecified: Secondary | ICD-10-CM | POA: Insufficient documentation

## 2016-07-03 DIAGNOSIS — I700 Atherosclerosis of aorta: Secondary | ICD-10-CM | POA: Diagnosis not present

## 2016-07-03 DIAGNOSIS — R222 Localized swelling, mass and lump, trunk: Secondary | ICD-10-CM | POA: Diagnosis not present

## 2016-07-03 DIAGNOSIS — Z801 Family history of malignant neoplasm of trachea, bronchus and lung: Secondary | ICD-10-CM | POA: Diagnosis not present

## 2016-07-03 DIAGNOSIS — Z809 Family history of malignant neoplasm, unspecified: Secondary | ICD-10-CM | POA: Insufficient documentation

## 2016-07-03 DIAGNOSIS — R918 Other nonspecific abnormal finding of lung field: Secondary | ICD-10-CM | POA: Diagnosis present

## 2016-07-03 DIAGNOSIS — Z9889 Other specified postprocedural states: Secondary | ICD-10-CM

## 2016-07-03 HISTORY — PX: VIDEO BRONCHOSCOPY WITH ENDOBRONCHIAL NAVIGATION: SHX6175

## 2016-07-03 SURGERY — VIDEO BRONCHOSCOPY WITH ENDOBRONCHIAL NAVIGATION
Anesthesia: General

## 2016-07-03 MED ORDER — DEXAMETHASONE SODIUM PHOSPHATE 10 MG/ML IJ SOLN
INTRAMUSCULAR | Status: AC
  Filled 2016-07-03: qty 1

## 2016-07-03 MED ORDER — FENTANYL CITRATE (PF) 100 MCG/2ML IJ SOLN: 25.0000 ug | INTRAMUSCULAR | Status: DC | PRN

## 2016-07-03 MED ORDER — EPINEPHRINE HCL 1 MG/ML IJ SOLN
INTRAMUSCULAR | Status: AC
  Filled 2016-07-03: qty 1

## 2016-07-03 MED ORDER — ROCURONIUM BROMIDE 10 MG/ML (PF) SYRINGE
PREFILLED_SYRINGE | INTRAVENOUS | Status: AC
  Filled 2016-07-03: qty 10

## 2016-07-03 MED ORDER — PHENYLEPHRINE 40 MCG/ML (10ML) SYRINGE FOR IV PUSH (FOR BLOOD PRESSURE SUPPORT)
PREFILLED_SYRINGE | INTRAVENOUS | Status: AC
  Filled 2016-07-03: qty 10

## 2016-07-03 MED ORDER — LIDOCAINE HCL (CARDIAC) 20 MG/ML IV SOLN
INTRAVENOUS | Status: DC | PRN
  Administered 2016-07-03: 60 mg via INTRAVENOUS

## 2016-07-03 MED ORDER — ONDANSETRON HCL 4 MG/2ML IJ SOLN: 4.0000 mg | Freq: Four times a day (QID) | INTRAMUSCULAR | Status: DC | PRN

## 2016-07-03 MED ORDER — FENTANYL CITRATE (PF) 100 MCG/2ML IJ SOLN
INTRAMUSCULAR | Status: DC | PRN
  Administered 2016-07-03 (×2): 50 ug via INTRAVENOUS

## 2016-07-03 MED ORDER — FENTANYL CITRATE (PF) 100 MCG/2ML IJ SOLN
INTRAMUSCULAR | Status: AC
  Filled 2016-07-03: qty 2

## 2016-07-03 MED ORDER — OXYCODONE HCL 5 MG PO TABS: 5.0000 mg | ORAL_TABLET | Freq: Once | ORAL | Status: DC | PRN

## 2016-07-03 MED ORDER — ONDANSETRON HCL 4 MG/2ML IJ SOLN
INTRAMUSCULAR | Status: DC | PRN
  Administered 2016-07-03: 4 mg via INTRAVENOUS

## 2016-07-03 MED ORDER — EPHEDRINE 5 MG/ML INJ
INTRAVENOUS | Status: AC
  Filled 2016-07-03: qty 10

## 2016-07-03 MED ORDER — PROPOFOL 10 MG/ML IV BOLUS
INTRAVENOUS | Status: AC
  Filled 2016-07-03: qty 20

## 2016-07-03 MED ORDER — ROCURONIUM BROMIDE 100 MG/10ML IV SOLN
INTRAVENOUS | Status: DC | PRN
  Administered 2016-07-03: 50 mg via INTRAVENOUS

## 2016-07-03 MED ORDER — LACTATED RINGERS IV SOLN
INTRAVENOUS | Status: DC | PRN
  Administered 2016-07-03 (×2): via INTRAVENOUS

## 2016-07-03 MED ORDER — MIDAZOLAM HCL 2 MG/2ML IJ SOLN
INTRAMUSCULAR | Status: AC
  Filled 2016-07-03: qty 2

## 2016-07-03 MED ORDER — ONDANSETRON HCL 4 MG/2ML IJ SOLN
INTRAMUSCULAR | Status: AC
  Filled 2016-07-03: qty 2

## 2016-07-03 MED ORDER — LIDOCAINE HCL 4 % MT SOLN
OROMUCOSAL | Status: DC | PRN
  Administered 2016-07-03: 4 mL via TOPICAL

## 2016-07-03 MED ORDER — 0.9 % SODIUM CHLORIDE (POUR BTL) OPTIME
TOPICAL | Status: DC | PRN
  Administered 2016-07-03: 1000 mL

## 2016-07-03 MED ORDER — ARTIFICIAL TEARS OP OINT
TOPICAL_OINTMENT | OPHTHALMIC | Status: AC
  Filled 2016-07-03: qty 7

## 2016-07-03 MED ORDER — ARTIFICIAL TEARS OP OINT
TOPICAL_OINTMENT | OPHTHALMIC | Status: DC | PRN
  Administered 2016-07-03: 1 via OPHTHALMIC

## 2016-07-03 MED ORDER — SUGAMMADEX SODIUM 200 MG/2ML IV SOLN
INTRAVENOUS | Status: DC | PRN
  Administered 2016-07-03: 200 mg via INTRAVENOUS

## 2016-07-03 MED ORDER — DEXAMETHASONE SODIUM PHOSPHATE 10 MG/ML IJ SOLN
INTRAMUSCULAR | Status: DC | PRN
  Administered 2016-07-03: 10 mg via INTRAVENOUS

## 2016-07-03 MED ORDER — OXYCODONE HCL 5 MG/5ML PO SOLN: 5.0000 mg | Freq: Once | ORAL | Status: DC | PRN

## 2016-07-03 MED ORDER — LIDOCAINE 2% (20 MG/ML) 5 ML SYRINGE
INTRAMUSCULAR | Status: AC
  Filled 2016-07-03: qty 5

## 2016-07-03 MED ORDER — SUGAMMADEX SODIUM 200 MG/2ML IV SOLN
INTRAVENOUS | Status: AC
  Filled 2016-07-03: qty 2

## 2016-07-03 MED ORDER — MIDAZOLAM HCL 5 MG/5ML IJ SOLN
INTRAMUSCULAR | Status: DC | PRN
  Administered 2016-07-03: 2 mg via INTRAVENOUS

## 2016-07-03 MED ORDER — LIDOCAINE 2% (20 MG/ML) 5 ML SYRINGE
INTRAMUSCULAR | Status: AC
  Filled 2016-07-03: qty 10

## 2016-07-03 MED ORDER — PROPOFOL 10 MG/ML IV BOLUS
INTRAVENOUS | Status: DC | PRN
  Administered 2016-07-03: 150 mg via INTRAVENOUS

## 2016-07-03 SURGICAL SUPPLY — 37 items
ADAPTER BRONCH F/PENTAX (ADAPTER) ×3 IMPLANT
BRUSH SUPERTRAX BIOPSY (INSTRUMENTS) IMPLANT
BRUSH SUPERTRAX NDL-TIP CYTO (INSTRUMENTS) ×3 IMPLANT
CANISTER SUCTION 2500CC (MISCELLANEOUS) ×3 IMPLANT
CHANNEL WORK EXTEND EDGE 180 (KITS) IMPLANT
CHANNEL WORK EXTEND EDGE 45 (KITS) IMPLANT
CHANNEL WORK EXTEND EDGE 90 (KITS) IMPLANT
CONT SPEC 4OZ CLIKSEAL STRL BL (MISCELLANEOUS) ×6 IMPLANT
COVER TABLE BACK 60X90 (DRAPES) ×3 IMPLANT
FILTER STRAW FLUID ASPIR (MISCELLANEOUS) IMPLANT
FORCEPS BIOP SUPERTRX PREMAR (INSTRUMENTS) ×3 IMPLANT
GAUZE SPONGE 4X4 12PLY STRL (GAUZE/BANDAGES/DRESSINGS) ×3 IMPLANT
GLOVE SURG SIGNA 7.5 PF LTX (GLOVE) ×3 IMPLANT
GLOVE SURG SS PI 7.0 STRL IVOR (GLOVE) ×3 IMPLANT
GOWN STRL REUS W/ TWL XL LVL3 (GOWN DISPOSABLE) ×2 IMPLANT
GOWN STRL REUS W/TWL XL LVL3 (GOWN DISPOSABLE) ×4
KIT CLEAN ENDO COMPLIANCE (KITS) ×3 IMPLANT
KIT PROCEDURE EDGE 180 (KITS) ×3 IMPLANT
KIT PROCEDURE EDGE 45 (KITS) IMPLANT
KIT PROCEDURE EDGE 90 (KITS) IMPLANT
KIT ROOM TURNOVER OR (KITS) ×3 IMPLANT
MARKER SKIN DUAL TIP RULER LAB (MISCELLANEOUS) ×3 IMPLANT
NEEDLE SUPERTRX PREMARK BIOPSY (NEEDLE) ×3 IMPLANT
NS IRRIG 1000ML POUR BTL (IV SOLUTION) ×3 IMPLANT
OIL SILICONE PENTAX (PARTS (SERVICE/REPAIRS)) ×3 IMPLANT
PAD ARMBOARD 7.5X6 YLW CONV (MISCELLANEOUS) ×6 IMPLANT
PATCHES PATIENT (LABEL) ×9 IMPLANT
SYR 20CC LL (SYRINGE) ×3 IMPLANT
SYR 20ML ECCENTRIC (SYRINGE) ×3 IMPLANT
SYR 30ML LL (SYRINGE) ×3 IMPLANT
SYR 5ML LL (SYRINGE) ×3 IMPLANT
TOWEL OR 17X24 6PK STRL BLUE (TOWEL DISPOSABLE) ×3 IMPLANT
TRAP SPECIMEN MUCOUS 40CC (MISCELLANEOUS) ×3 IMPLANT
TUBE CONNECTING 20'X1/4 (TUBING) ×2
TUBE CONNECTING 20X1/4 (TUBING) ×4 IMPLANT
UNDERPAD 30X30 (UNDERPADS AND DIAPERS) ×3 IMPLANT
WATER STERILE IRR 1000ML POUR (IV SOLUTION) ×3 IMPLANT

## 2016-07-03 NOTE — Anesthesia Preprocedure Evaluation (Signed)
Anesthesia Evaluation  Patient identified by MRN, date of birth, ID band Patient awake    Reviewed: Allergy & Precautions, H&P , NPO status , Patient's Chart, lab work & pertinent test results  Airway Mallampati: II   Neck ROM: full    Dental   Pulmonary COPD, former smoker,  LUL nodule   breath sounds clear to auscultation       Cardiovascular hypertension,  Rhythm:regular Rate:Normal     Neuro/Psych    GI/Hepatic   Endo/Other    Renal/GU      Musculoskeletal  (+) Arthritis ,   Abdominal   Peds  Hematology   Anesthesia Other Findings   Reproductive/Obstetrics                             Anesthesia Physical Anesthesia Plan  ASA: III  Anesthesia Plan: General   Post-op Pain Management:    Induction: Intravenous  Airway Management Planned: Oral ETT  Additional Equipment:   Intra-op Plan:   Post-operative Plan: Extubation in OR  Informed Consent: I have reviewed the patients History and Physical, chart, labs and discussed the procedure including the risks, benefits and alternatives for the proposed anesthesia with the patient or authorized representative who has indicated his/her understanding and acceptance.     Plan Discussed with: CRNA, Anesthesiologist and Surgeon  Anesthesia Plan Comments:         Anesthesia Quick Evaluation

## 2016-07-03 NOTE — Discharge Instructions (Addendum)
Do not drive or engage in heavy physical activity for 24 hours.   You may resume normal activities tomorrow  You may use over the counter cough medication or pain relievers (i.e. Tylenol) if needed  You may cough up small amounts of blood over the next few days  Call [phone] if you develop chest pain, shortness of breath or cough more than 2 tablespoons of blood  My office will contact you with follow up information

## 2016-07-03 NOTE — H&P (View-Only) (Signed)
PCP is Claretta Fraise, MD Referring Provider is Claretta Fraise, MD  Chief Complaint  Patient presents with  . Lung Mass    Surgical eval, PET Scan 06/19/16, Chest CT 06/01/16, PFT's 06/19/16    HPI: [name] is sent for consultation regarding a left upper lobe nodule  [name] is a 65 year old man with a 45-pack-year history of tobacco abuse, COPD, and hypertension who recently was found to have a left upper lobe nodule. In early September he had "sinus problem." He had nasal congestion sneezing and coughing. He was concerned that it could be turning into an pneumonia as he was feeling a little more wheezing and shortness of breath. A chest x-ray showed an abnormality. CT of the chest showed a left upper lobe nodule measuring 3.8 x 1.2 x 2.5 cm. There was some questionable adenopathy. A PET CT showed the left upper lobe nodule was hypermetabolic. There was no evidence of regional or distant metastases.  [name]. He does have a cough. He also has wheezing usually when the humidity is high. His congestion postnasal drip have improved. He denies chest pain, pressure, or tightness. He says that he can walk up a flight of stairs without stopping. He has not had any change in appetite or weight loss. He denies unusual headaches or visual changes. He denies unusual bone or joint pain.   Randall Sullivan Score: At the time of surgery this patient's most appropriate activity status/level should be described as: '[]'$     0    Normal activity, no symptoms '[x]'$     1    Restricted in physical strenuous activity but ambulatory, able to do out light work '[]'$     2    Ambulatory and capable of self care, unable to do work activities, up and about >50 % of waking hours                              '[]'$     3    Only limited self care, in bed greater than 50% of waking hours '[]'$     4    Completely disabled, no self care, confined to bed or chair '[]'$     5    Moribund  Past Medical History:   Diagnosis Date  . Emphysema   . Hypertension     Past Surgical History:  Procedure Laterality Date  . HERNIA REPAIR      Family History  Problem Relation Age of Onset  . Cancer Mother   . Cancer Sister   . Aneurysm Sister   . Kidney disease Paternal Aunt   . Asthma Brother   . Cancer - Lung Brother     Social History Social History  Substance Use Topics  . Smoking status: Former Smoker    Packs/day: 1.00    Years: 40.00    Types: Cigarettes    Quit date: 05/28/2016  . Smokeless tobacco: Never Used  . Alcohol use No    Current Outpatient Prescriptions  Medication Sig Dispense Refill  . acetaminophen (TYLENOL) 500 MG tablet Take 500 mg by mouth every 6 (six) hours as needed for mild pain. For pain     . albuterol (PROVENTIL) (2.5 MG/3ML) 0.083% nebulizer solution USE 1 VIAL PER NEBULIZATION FOUR TIMES DAILY AS NEEDED 75 mL 5  . fexofenadine (ALLEGRA) 180 MG tablet Take 1 tablet (180 mg total) by mouth daily. (Patient taking differently: Take  180 mg by mouth daily as needed. ) 30 tablet 11  . hydrocortisone (ANUSOL-HC) 25 MG suppository Place 1 suppository (25 mg total) rectally 2 (two) times daily. 12 suppository 0  . Lidocaine, Anorectal, 5 % CREA Apply to the rectum up to 6 times daily 45 g 0  . lisinopril (PRINIVIL,ZESTRIL) 20 MG tablet TAKE 1 TABLET (20 MG TOTAL) BY MOUTH DAILY. 30 tablet 1  . PROAIR HFA 108 (90 Base) MCG/ACT inhaler INHALE 2 PUFFS INTO THE LUNGS EVERY SIX HOURS AS NEEDED FOR SHORTNESS OF BREATH 8.5 Inhaler 2   No current facility-administered medications for this visit.     No Known Allergies  Review of Systems  Constitutional: Negative for activity change, appetite change, fatigue and unexpected weight change.  HENT: Positive for congestion, postnasal drip and sinus pressure. Negative for trouble swallowing and voice change.   Eyes: Negative for visual disturbance.  Respiratory: Positive for cough, shortness of breath and wheezing.    Cardiovascular: Negative for chest pain, palpitations and leg swelling.  Gastrointestinal: Negative for abdominal pain and blood in stool.  Genitourinary: Negative for difficulty urinating and dysuria.  Musculoskeletal: Negative for arthralgias, back pain and gait problem.  Neurological: Negative for syncope, speech difficulty and weakness.  Hematological: Negative for adenopathy. Does not bruise/bleed easily.  All other systems reviewed and are negative.   BP (!) 144/80   Pulse 92   Ht '5\' 10"'$  (1.778 m)   Wt 174 lb (78.9 kg)   SpO2 95% Comment: RA  BMI 24.97 kg/m  Physical Exam  Constitutional: He is oriented to person, place, and time. He appears well-developed and well-nourished. No distress.  HENT:  Head: Normocephalic and atraumatic.  Mouth/Throat: No oropharyngeal exudate.  Eyes: Conjunctivae and EOM are normal. No scleral icterus.  Neck: Neck supple. No thyromegaly present.  Cardiovascular: Normal rate, regular rhythm and normal heart sounds.   No murmur heard. Pulmonary/Chest: Effort normal. No respiratory distress. He has no wheezes. He has no rales.  Diminished breath sounds bilaterally  Abdominal: Soft. Bowel sounds are normal. He exhibits no distension. There is no tenderness.  Musculoskeletal: Normal range of motion. He exhibits no edema.  Lymphadenopathy:    He has no cervical adenopathy.  Neurological: He is alert and oriented to person, place, and time. No cranial nerve deficit.  No focal motor deficit  Skin: Skin is warm and dry.  Vitals reviewed.    Diagnostic Tests: CT CHEST WITHOUT CONTRAST  TECHNIQUE: Multidetector CT imaging of the chest was performed following the standard protocol without IV contrast.  COMPARISON:  Chest radiographs dated 06/01/2016  FINDINGS: Cardiovascular: The heart is normal in size. No pericardial effusion.  Coronary atherosclerosis in the LAD and right coronary artery.  No evidence of thoracic aortic  aneurysm.  Mediastinum/Nodes: Small mediastinal lymph nodes, including an 8 mm short axis AP window node (series 2/ image 65), within the upper limits of normal.  Visualized thyroid is unremarkable.  Lungs/Pleura: 3.8 x 1.2 x 2.5 cm spiculated left upper lobe mass (series 3/ image 45), corresponding to radiographic abnormality, suspicious for primary bronchogenic neoplasm.  Moderate paraseptal emphysematous changes.  Mild bronchial wall thickening with mucous plugging in the right middle lobe (series 3/ image 103). Additional scattered areas of bronchial wall thickening.  No focal consolidation.  No pleural effusion or pneumothorax.  Upper Abdomen: Visualized upper abdomen is unremarkable.  Musculoskeletal: Mild degenerative changes of the visualized thoracolumbar spine.  IMPRESSION: 3.8 cm spiculated left upper lobe mass, corresponding to the  radiographic abnormality, suspicious for primary bronchogenic neoplasm.  Small mediastinal lymph nodes measuring up to 8 mm short axis, within the upper limits of normal.  Consider percutaneous sampling for tissue confirmation. PET-CT may be beneficial for staging as clinically warranted.   Electronically Signed   By: Julian Hy M.D.   On: 06/01/2016 12:29 NUCLEAR MEDICINE PET SKULL BASE TO THIGH  TECHNIQUE: 8.6 mCi F-18 FDG was injected intravenously. Full-ring PET imaging was performed from the skull base to thigh after the radiotracer. CT data was obtained and used for attenuation correction and anatomic localization.  FASTING BLOOD GLUCOSE:  Value: 101 mg/dl  COMPARISON:  CT chest dated 06/02/2016.  PET-CT dated 07/03/2010.  FINDINGS: NECK  No hypermetabolic lymph nodes in the neck.  CHEST  3.3 x 1.4 cm subsolid left upper lobe pulmonary nodule with 2.6 cm spiculated solid component (series 8/image 20), max SUV 5.3, compatible with primary bronchogenic neoplasm. This has  progressed from 2011.  Underlying moderate paraseptal emphysematous changes, upper lobe predominant. No focal consolidation. No pleural effusion or pneumothorax.  The heart is normal in size. No pericardial effusion. Mild coronary atherosclerosis in the LAD and left circumflex.  No hypermetabolic thoracic lymphadenopathy.  ABDOMEN/PELVIS  No abnormal hypermetabolic activity within the liver, pancreas, adrenal glands, or spleen.  Mild atherosclerotic calcifications of the abdominal aorta and branch vessels. 3.3 x 3.4 cm infrarenal abdominal aortic aneurysm (series 4/ image 145). Small fat containing bilateral inguinal hernias.  No hypermetabolic lymph nodes in the abdomen or pelvis.  SKELETON  No focal hypermetabolic activity to suggest skeletal metastasis.  IMPRESSION: 3.3 x 1.4 cm hypermetabolic subsolid left upper lobe pulmonary nodule, compatible with primary bronchogenic neoplasm.  No findings suspicious for metastatic disease.   Electronically Signed   By: Julian Hy M.D.   On: 06/19/2016 11:58  I personally reviewed the CT chest and PET/CT and concur with the findings noted above.  Pulmonary function testing FVC 2.81 (61%) FEV1 1.03 (30%) FEV1 1.25 (37%) post bronchodilator DLCO 17.03 (52%)  Impression: Randall Sullivan is a 65 year old woman with a long history of tobacco abuse (45 pack years) and COPD. He has been found to have a new lung nodule measuring 3.8 x 1.2 x 2.5 cm in the left upper lobe. This is in close proximity to emphysematous blebs. This is not approachable with a CT-guided biopsy due to the surrounding bullous emphysema. It is approachable with a navigational bronchoscopy, but with elevated risk of pneumothorax. I do think he would be best in his case to proceed with navigational bronchoscopy to biopsy the nodule before subjecting him to surgery.  I did have a long discussion with Randall Sullivan his family. They understand the most  likely scenario is that this is a new primary bronchogenic carcinoma. They understand that infectious or inflammatory nodules can also present in this fashion but are much less common. Based on his CT and PET/CT if this is cancer and likely is an early-stage lesion, clinical stage Ib.  We also discussed possible treatment strategy since this is a cancer. His PFTs are marginal but I think with a lingular sparing left upper lobectomy would primarily be removing destroyed lung and would not have a major impact on his respiratory capacity. I think the option of stereotactic radiation is also reasonable in his case, but I think an anatomic resection would offer a better chance for cure.  I described the proposed procedure of electromagnetic navigational bronchoscopy to Randall Sullivan and his family. They understand this  would be done in the operating room under general anesthesia. It would be done on an outpatient basis. I reviewed the indications, risks, benefits, and alternatives. They understand the risks include, but are not limited to those associated with general anesthesia, death, MI, bleeding, pneumothorax, and failure to make a diagnosis, as well as the possibility of other unforeseeable complications.  He understands and accepts the risks and agrees to proceed.  Plan: Electromagnetic navigational bronchoscopy for biopsy of left upper lobe nodule on Wednesday, 07/03/2016  [name], MD [facility] [phone]

## 2016-07-03 NOTE — Interval H&P Note (Signed)
History and Physical Interval Note:  07/03/2016 8:14 AM  Randall Sullivan  has presented today for surgery, with the diagnosis of LUL NODULE  The various methods of treatment have been discussed with the patient and family. After consideration of risks, benefits and other options for treatment, the patient has consented to  Procedure(s): [place] (N/A) as a surgical intervention .  The patient's history has been reviewed, patient examined, no change in status, stable for surgery.  I have reviewed the patient's chart and labs.  Questions were answered to the patient's satisfaction.     [name]

## 2016-07-03 NOTE — Anesthesia Procedure Notes (Signed)
Procedure Name: Intubation Date/Time: 07/03/2016 8:37 AM Performed by: FOLEY, ROGER A Pre-anesthesia Checklist: Patient identified, Emergency Drugs available, Suction available and Patient being monitored Patient Re-evaluated:Patient Re-evaluated prior to inductionOxygen Delivery Method: Circle System Utilized and Circle system utilized Preoxygenation: Pre-oxygenation with 100% oxygen Intubation Type: IV induction and Cricoid Pressure applied Ventilation: Mask ventilation without difficulty Laryngoscope Size: Mac and 4 Grade View: Grade I Tube type: Oral Tube size: 8.5 mm Number of attempts: 1 Airway Equipment and Method: Stylet,  Oral airway and LTA kit utilized Placement Confirmation: ETT inserted through vocal cords under direct vision,  positive ETCO2 and breath sounds checked- equal and bilateral Secured at: 23 cm Tube secured with: Tape Dental Injury: Teeth and Oropharynx as per pre-operative assessment        

## 2016-07-03 NOTE — Transfer of Care (Signed)
Immediate Anesthesia Transfer of Care Note  Patient: Randall Sullivan  Procedure(s) Performed: Procedure(s): VIDEO BRONCHOSCOPY WITH ENDOBRONCHIAL NAVIGATION (N/A)  Patient Location: PACU  Anesthesia Type:General  Level of Consciousness: pateint uncooperative and responds to stimulation  Airway & Oxygen Therapy: Patient Spontanous Breathing and Patient connected to nasal cannula oxygen  Post-op Assessment: Report given to RN, Post -op Vital signs reviewed and stable, Patient moving all extremities and Patient moving all extremities X 4  Post vital signs: Reviewed and stable  Last Vitals:  Vitals:   07/03/16 0711  BP: 127/86  Pulse: 78  Resp: 18  Temp: 36.7 C    Last Pain:  Vitals:   07/03/16 0711  TempSrc: Oral      Patients Stated Pain Goal: 7 (31/54/00 8676)  Complications: No apparent anesthesia complications

## 2016-07-03 NOTE — Brief Op Note (Signed)
07/03/2016  10:08 AM  PATIENT:  Randall Sullivan  65 y.o. male  PRE-OPERATIVE DIAGNOSIS:  LEFT UPPER LOBE NODULE  POST-OPERATIVE DIAGNOSIS:  LEFT UPPER LOBE NODULE  PROCEDURE:  Procedure(s): VIDEO BRONCHOSCOPY WITH ENDOBRONCHIAL NAVIGATION (N/A)  Brushings, needle aspirations, biopsies and BAL  SURGEON:  Surgeon(s) and Role:    * [name], MD - Primary  ANESTHESIA:   general  EBL:  Total I/O In: 1000 [I.V.:1000] Out: 5 [Blood:5]  BLOOD ADMINISTERED:none  DRAINS: none   LOCAL MEDICATIONS USED:  NONE  SPECIMEN:  Source of Specimen:  LUL nodule  DISPOSITION OF SPECIMEN:  PATHOLOGY  PLAN OF CARE: Discharge to home after PACU  PATIENT DISPOSITION:  PACU - hemodynamically stable.   Delay start of Pharmacological VTE agent (>24hrs) due to surgical blood loss or risk of bleeding: not applicable

## 2016-07-04 ENCOUNTER — Encounter (HOSPITAL_COMMUNITY): Payer: Self-pay | Admitting: Thoracic Surgery (Cardiothoracic Vascular Surgery)

## 2016-07-04 NOTE — Op Note (Signed)
NAME:  AYSON, CHERUBINI NO.:  [id number]  MEDICAL RECORD NO.:  [id number]  LOCATION:  [facility]                         FACILITY:  [facility]  PHYSICIAN:  Revonda Standard. Roxan Hockey, M.D.DATE OF BIRTH:  04-Dec-1950  DATE OF PROCEDURE:  07/03/2016 DATE OF DISCHARGE:  07/03/2016                              OPERATIVE REPORT   PREOPERATIVE DIAGNOSIS:  Left upper lobe mass.  POSTOPERATIVE DIAGNOSIS:  Left upper lobe mass.  PROCEDURE:  Electromagnetic navigational bronchoscopy with brushings, needle aspirations, and biopsies.  SURGEON:  Revonda Standard. Roxan Hockey, M.D.  ASSISTANT:  None.  ANESTHESIA:  General.  FINDINGS:  Quick prep of brushings and needle aspirations showed tumor cells.  Final determination will await permanent pathology.  CLINICAL NOTE:  Mr. Derise is a 65 year old gentleman with a history of tobacco abuse, who recently has been found to have a left upper lobe spiculated nodule.  It is in close proximity to multiple blebs, and is not favorable for CT-guided biopsy.  The patient was advised to undergo electromagnetic navigational bronchoscopy for biopsy.  The indications, risks, benefits, and alternatives were discussed in detail with the patient.  He understood and accepted the risks, including the high risk for pneumothorax and agreed to proceed.  OPERATIVE NOTE:  Mr. Mckoy was brought to the operating room on July 03, 2016.  He had induction of general anesthesia and was intubated. After performing a time-out, flexible fiberoptic bronchoscopy was performed.  There was normal endobronchial anatomy and were no endobronchial lesions to the level of the subsegmental bronchi.  There were some thick clear secretions which were cleared with saline.  Locatable guide for navigation then was placed.  Registration was performed.  There was good correlation of the video and virtual bronchoscopy.  The bronchoscope was advanced to the origin of the left upper  lobe bronchus and the locatable guide and catheter were advanced initially to within 1.3 cm of the lesion with good alignment.  Multiple brushings were obtained and then biopsies were obtained.  Two the biopsies were sent for AFB and fungal cultures.  The remainder were sent for permanent pathology.  Next, the catheter was repositioned and was now within 5 mm of the center of the nodule.  Brushings and needle aspirations were performed, and sent for quick prep.  Multiple biopsies were taken for permanent pathology.  Bronchoalveolar lavage was performed with 60 mL of saline.  There was return of approximately 20 mL of saline.  The quick prep returned showing no tumor cells, but final determination will await permanent pathology.  Final inspection was made with the bronchoscope. There was no bleeding.  The bronchoscope was removed.  The patient was extubated in the operating room and taken to the postanesthetic care unit in good condition.     Revonda Standard Roxan Hockey, M.D.     SCH/MEDQ  D:  07/03/2016  T:  07/04/2016  Job:  [id number]

## 2016-07-04 NOTE — Anesthesia Postprocedure Evaluation (Signed)
Anesthesia Post Note  Patient: Randall Sullivan  Procedure(s) Performed: Procedure(s) (LRB): VIDEO BRONCHOSCOPY WITH ENDOBRONCHIAL NAVIGATION (N/A)  Patient location during evaluation: PACU Anesthesia Type: General Level of consciousness: awake and alert and patient cooperative Pain management: pain level controlled Vital Signs Assessment: post-procedure vital signs reviewed and stable Respiratory status: spontaneous breathing and respiratory function stable Cardiovascular status: stable Anesthetic complications: no    Last Vitals:  Vitals:   07/03/16 1118 07/03/16 1125  BP:  125/71  Pulse: 80 83  Resp: 18 18  Temp:      Last Pain:  Vitals:   07/03/16 1125  TempSrc:   PainSc: [place]

## 2016-07-05 LAB — CULTURE, RESPIRATORY W GRAM STAIN

## 2016-07-05 LAB — AEROBIC CULTURE W GRAM STAIN (SUPERFICIAL SPECIMEN): Culture: NO GROWTH

## 2016-07-05 LAB — AEROBIC CULTURE  (SUPERFICIAL SPECIMEN)

## 2016-07-09 ENCOUNTER — Other Ambulatory Visit: Payer: Self-pay | Admitting: *Deleted

## 2016-07-09 ENCOUNTER — Encounter: Payer: Self-pay | Admitting: Thoracic Surgery (Cardiothoracic Vascular Surgery)

## 2016-07-09 ENCOUNTER — Ambulatory Visit (INDEPENDENT_AMBULATORY_CARE_PROVIDER_SITE_OTHER): Payer: Medicare Other | Admitting: Thoracic Surgery (Cardiothoracic Vascular Surgery)

## 2016-07-09 VITALS — BP 140/81 | HR 74 | Resp 16 | Ht 70.0 in | Wt 174.0 lb

## 2016-07-09 DIAGNOSIS — R918 Other nonspecific abnormal finding of lung field: Secondary | ICD-10-CM | POA: Diagnosis not present

## 2016-07-09 DIAGNOSIS — C3412 Malignant neoplasm of upper lobe, left bronchus or lung: Secondary | ICD-10-CM

## 2016-07-09 LAB — ACID FAST SMEAR (AFB, MYCOBACTERIA)
Acid Fast Smear: NEGATIVE
Acid Fast Smear: NEGATIVE

## 2016-07-09 LAB — ACID FAST SMEAR (AFB)

## 2016-07-09 NOTE — Progress Notes (Signed)
[address]       [place] [address]             [phone]     HPI: Mr. Bethel returns today for scheduled visit to discuss results of his bronchoscopy.  He is a 65 year old man with a history of tobacco abuse and COPD who was found to have a 3.8 x 1.2 x 2.5 cm spiculated nodule left upper lobe. This was hypermetabolic on PET. There is no evidence of regional or distant metastases. I did a navigational bronchoscopy on 07/03/2016. The intraoperative results were suspicious but not definitive. Final pathology showed well-differentiated adenocarcinoma.  He tolerated the procedure well. He did cough up small amounts of blood for a day or 2 afterwards, but otherwise did not have any problems.  He quit smoking prior to his bronchoscopy. He says is now having a cigarette with a cup of coffee once a day.  Past Medical History:  Diagnosis Date  . Arthritis   . Emphysema   . Hypertension    Past Surgical History:  Procedure Laterality Date  . HERNIA REPAIR Bilateral 2694   umbilical  . VIDEO BRONCHOSCOPY WITH ENDOBRONCHIAL NAVIGATION N/A 07/03/2016   Procedure: VIDEO BRONCHOSCOPY WITH ENDOBRONCHIAL NAVIGATION;  Surgeon: [name], MD;  Location: [facility];  Service: Thoracic;  Laterality: N/A;      Current Outpatient Prescriptions  Medication Sig Dispense Refill  . albuterol (PROVENTIL) (2.5 MG/3ML) 0.083% nebulizer solution USE 1 VIAL PER NEBULIZATION FOUR TIMES DAILY AS NEEDED (Patient taking differently: USE 1 VIAL PER NEBULIZATION FOUR TIMES DAILY AS NEEDED FOR SHORTNESS OF BREATH) 75 mL 5  . fexofenadine (ALLEGRA) 180 MG tablet Take 1 tablet (180 mg total) by mouth daily. (Patient taking differently: Take 180 mg by mouth daily as needed for allergies. ) 30 tablet 11  . lisinopril (PRINIVIL,ZESTRIL) 20 MG tablet TAKE 1 TABLET (20 MG TOTAL) BY MOUTH DAILY. 30 tablet 1  . PROAIR HFA 108 (90 Base) MCG/ACT inhaler INHALE 2 PUFFS INTO THE LUNGS EVERY SIX HOURS  AS NEEDED FOR SHORTNESS OF BREATH 8.5 Inhaler 2  . acetaminophen (TYLENOL) 500 MG tablet Take 500 mg by mouth every 6 (six) hours as needed for mild pain. For pain      No current facility-administered medications for this visit.     Physical Exam BP 140/81   Pulse 74   Resp 16   Ht '5\' 10"'$  (1.778 m)   Wt 174 lb (78.9 kg)   SpO2 96% Comment: ON RA  BMI 24.64 kg/m  65 year old man in no acute distress Alert and oriented 3 with no focal deficits No cervical or supraclavicular adenopathy Lungs clear with diminished but equal breath sounds bilaterally Cardiac regular rate and rhythm normal S1 and S2 Extremities are without clubbing cyanosis or edema  Diagnostic Tests: I again personally reviewed the CT and PET CT. I also again reviewed his pulmonary function testing. His FEV1 is 1.03 (30% of predicted), and improves to 1.25 (37%) with bronchodilators. DLCO is 52% of predicted. He has upper lobe predominant emphysema. Pathology= well-differentiated adenocarcinoma  Impression: Mr. 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 I discussed the pathology results with Mr. and Mrs. Sommerfeld.  We discussed options for treatment including surgical resection and stereotactic radiation. I discussed the relative advantages and disadvantages of each approach. I do think his cure rate  is higher with anatomical surgical resection, but surgical risk is higher as well. Radiation is generally well tolerated, avoids surgical risk, but has a lower chance of local control and overall survival. I described the nature of the operation to him. We will plan to do a left upper lobectomy but hopefully can do a lingular sparing lobectomy. He understands the general nature of the operation, the need for general anesthesia, the incisions to be used, use of drainage tubes postoperatively, the expected hospital stay, and  the overall recovery. He understands that surgery does not guaranteed a cure. I informed him of the indications, risks, benefits, and alternatives. He understands the risk include, but are not limited to death, MI, DVT, PE, bleeding, possible need for transfusion, infection, prolonged air leak, cardiac arrhythmias, as well as the possibility of other unforeseeable complications. He is a high risk for preoperative pulmonary complications and respiratory failure requiring ventilation.  He wishes to think his options over for a couple of days. He sees Dr. Lake Bells on Thursday. He will let us know if he would like to proceed. If he would prefer to proceed with radiation we will arrange an appointment with radiation oncology.  Plan: Left VATS, left upper lobectomy (possible lingular sparing) if he decides to proceed.  [name], MD [facility] [phone]

## 2016-07-11 ENCOUNTER — Ambulatory Visit: Payer: Medicare Other | Admitting: Pulmonary Disease

## 2016-07-18 ENCOUNTER — Other Ambulatory Visit: Payer: Self-pay | Admitting: Family Medicine

## 2016-07-24 ENCOUNTER — Telehealth: Payer: Self-pay | Admitting: Family Medicine

## 2016-07-24 ENCOUNTER — Other Ambulatory Visit: Payer: Self-pay | Admitting: Family Medicine

## 2016-07-24 MED ORDER — ALBUTEROL SULFATE HFA 108 (90 BASE) MCG/ACT IN AERS: 2.0000 | INHALATION_SPRAY | Freq: Four times a day (QID) | RESPIRATORY_TRACT | 11 refills | Status: DC | PRN

## 2016-07-24 NOTE — Telephone Encounter (Signed)
Please contact the patient That's the cheapest I know of that has any chance of being effective

## 2016-07-24 NOTE — Telephone Encounter (Signed)
Pt notified of RX 

## 2016-07-24 NOTE — Telephone Encounter (Signed)
Please contact the patient there is nothing cheaper.

## 2016-07-24 NOTE — Telephone Encounter (Signed)
Pt aware albuterol was sent to pharmacy. He has already called pharmacy, this is pricey too. Is there any other alternative

## 2016-07-24 NOTE — Telephone Encounter (Signed)
I sent in the requested prescription 

## 2016-07-27 LAB — FUNGUS CULTURE WITH STAIN

## 2016-07-27 LAB — FUNGUS CULTURE RESULT

## 2016-07-27 LAB — FUNGAL ORGANISM REFLEX

## 2016-07-30 ENCOUNTER — Encounter (HOSPITAL_COMMUNITY): Payer: Self-pay

## 2016-07-30 ENCOUNTER — Encounter (HOSPITAL_COMMUNITY)
Admission: RE | Admit: 2016-07-30 | Discharge: 2016-07-30 | Disposition: A | Discharge status: Home / Self Care | Payer: Medicare Other | Source: Ambulatory Visit | Attending: Thoracic Surgery (Cardiothoracic Vascular Surgery) | Admitting: Thoracic Surgery (Cardiothoracic Vascular Surgery)

## 2016-07-30 DIAGNOSIS — Z01818 Encounter for other preprocedural examination: Secondary | ICD-10-CM | POA: Insufficient documentation

## 2016-07-30 DIAGNOSIS — Z87891 Personal history of nicotine dependence: Secondary | ICD-10-CM | POA: Insufficient documentation

## 2016-07-30 DIAGNOSIS — Z79899 Other long term (current) drug therapy: Secondary | ICD-10-CM | POA: Insufficient documentation

## 2016-07-30 DIAGNOSIS — Z0183 Encounter for blood typing: Secondary | ICD-10-CM

## 2016-07-30 DIAGNOSIS — I10 Essential (primary) hypertension: Secondary | ICD-10-CM

## 2016-07-30 DIAGNOSIS — Z01812 Encounter for preprocedural laboratory examination: Secondary | ICD-10-CM

## 2016-07-30 DIAGNOSIS — J449 Chronic obstructive pulmonary disease, unspecified: Secondary | ICD-10-CM | POA: Insufficient documentation

## 2016-07-30 DIAGNOSIS — C3412 Malignant neoplasm of upper lobe, left bronchus or lung: Secondary | ICD-10-CM

## 2016-07-30 HISTORY — DX: Dyspnea, unspecified: R06.00

## 2016-07-30 HISTORY — DX: Other complications of anesthesia, initial encounter: T88.59XA

## 2016-07-30 HISTORY — DX: Adverse effect of unspecified anesthetic, initial encounter: T41.45XA

## 2016-07-30 LAB — BLOOD GAS, ARTERIAL
ACID-BASE EXCESS: 2.2 mmol/L — AB (ref 0.0–2.0)
BICARBONATE: 26.4 mmol/L (ref 20.0–28.0)
Drawn by: 205171
FIO2: 0.21
O2 Saturation: 94.3 %
PATIENT TEMPERATURE: 98.6
PH ART: 7.411 (ref 7.350–7.450)
PO2 ART: 67.8 mmHg — AB (ref 83.0–108.0)
pCO2 arterial: 42.3 mmHg (ref 32.0–48.0)

## 2016-07-30 LAB — CBC
HEMATOCRIT: 47.3 % (ref 39.0–52.0)
Hemoglobin: 15.3 g/dL (ref 13.0–17.0)
MCH: 28.1 pg (ref 26.0–34.0)
MCHC: 32.3 g/dL (ref 30.0–36.0)
MCV: 86.8 fL (ref 78.0–100.0)
PLATELETS: 229 10*3/uL (ref 150–400)
RBC: 5.45 MIL/uL (ref 4.22–5.81)
RDW: 15 % (ref 11.5–15.5)
WBC: 9.1 10*3/uL (ref 4.0–10.5)

## 2016-07-30 LAB — COMPREHENSIVE METABOLIC PANEL
ALT: 13 U/L — ABNORMAL LOW (ref 17–63)
ANION GAP: 10 (ref 5–15)
AST: 19 U/L (ref 15–41)
Albumin: 4.3 g/dL (ref 3.5–5.0)
Alkaline Phosphatase: 48 U/L (ref 38–126)
BILIRUBIN TOTAL: 0.7 mg/dL (ref 0.3–1.2)
BUN: 14 mg/dL (ref 6–20)
CO2: 21 mmol/L — ABNORMAL LOW (ref 22–32)
Calcium: 9.5 mg/dL (ref 8.9–10.3)
Chloride: 104 mmol/L (ref 101–111)
Creatinine, Ser: 0.83 mg/dL (ref 0.61–1.24)
GFR calc Af Amer: >60 mL/min (ref >60)
Glucose, Bld: 78 mg/dL (ref 65–99)
POTASSIUM: 4.4 mmol/L (ref 3.5–5.1)
Sodium: 135 mmol/L (ref 135–145)
TOTAL PROTEIN: 7.2 g/dL (ref 6.5–8.1)

## 2016-07-30 LAB — URINALYSIS, ROUTINE W REFLEX MICROSCOPIC
BILIRUBIN URINE: NEGATIVE
GLUCOSE, UA: NEGATIVE mg/dL
HGB URINE DIPSTICK: NEGATIVE
KETONES UR: NEGATIVE mg/dL
Leukocytes, UA: NEGATIVE
Nitrite: NEGATIVE
PROTEIN: NEGATIVE mg/dL
Specific Gravity, Urine: 1.019 (ref 1.005–1.030)
pH: 7 (ref 5.0–8.0)

## 2016-07-30 LAB — SURGICAL PCR SCREEN
MRSA, PCR: NEGATIVE
Staphylococcus aureus: NEGATIVE

## 2016-07-30 LAB — TYPE AND SCREEN
ABO/RH(D): A POS
ANTIBODY SCREEN: NEGATIVE

## 2016-07-30 LAB — ABO/RH: ABO/RH(D): A POS

## 2016-07-30 NOTE — Progress Notes (Signed)
Lab called stating that the PT and PTT can't be run due to insufficient quantity. Will draw again day of surgery.

## 2016-07-30 NOTE — Progress Notes (Signed)
Pt. Denies any changes in his chest. Pt. Reports that he has emphysema, does not use O2 but uses his nebulizer two times per day.  Pt. Reports that he had a stress test > 10 yrs. Ago. He reports that his PCP is with [facility] medicine, Sees Dr. Quinn Axe.

## 2016-07-30 NOTE — Pre-Procedure Instructions (Signed)
Natalio Salois  07/30/2016      [facility] [address], [place] [address] [place] [address] Phone: [phone] Fax: [phone]  [id number] [place], [id number]: [phone]: [phone]   Your procedure is scheduled on 08/01/2016  [facility]  Call this number if you have problems the morning of surgery:  [phone]   Remember:  Do not eat food or drink liquids after midnight.  On Wednesday NIGHT  Take these medicines the morning of surgery with A SIP OF WATER : use your nebulizer as needed & albuterol inhaler    Do not wear jewelry   Do not wear lotions, powders, or perfumes, or deoderant.           .  Men may shave face and neck.   Do not bring valuables to the hospital.   [facility] or [facility]ables.  Contacts, dentures or bridgework may not be worn into surgery.  Leave your suitcase in the car.  After surgery it may be brought to your room.  For patients admitted to the hospital, discharge time will be determined by your treatment team.  Patients discharged the day of surgery will not be allowed to drive home.   Name and phone number of your driver:   With wife Special instructions:  Special Instructions: [facility] - Preparing for Surgery  Before surgery, you can play an important role.  Because skin is not sterile, your skin needs to be as free of germs as possible.  You can reduce the number of germs on you skin by washing with CHG (chlorahexidine gluconate) soap before surgery.  CHG is an antiseptic cleaner which kills germs and bonds with the skin to continue killing germs even after washing.  Please DO NOT use if you have an allergy to CHG or antibacterial soaps.  If your skin becomes reddened/irritated stop using the CHG and inform your nurse  when you arrive at Short Stay.  Do not shave (including legs and underarms) for at least 48 hours prior to the first CHG shower.  You may shave your face.  Please follow these instructions carefully:   1.  Shower with CHG Soap the night before surgery and the  morning of Surgery.  2.  If you choose to wash your hair, wash your hair first as usual with your  normal shampoo.  3.  After you shampoo, rinse your hair and body thoroughly to remove the  Shampoo.  4.  Use CHG as you would any other liquid soap.  You can apply chg directly to the skin and wash gently with scrungie or a clean washcloth.  5.  Apply the CHG Soap to your body ONLY FROM THE NECK DOWN.    Do not use on open wounds or open sores.  Avoid contact with your eyes, ears, mouth and genitals (private parts).  Wash genitals (private parts)   with your normal soap.  6.  Wash thoroughly, paying special attention to the area where your surgery will be performed.  7.  Thoroughly rinse your body with warm water from the neck down.  8.  DO NOT shower/wash with your normal soap after using and rinsing off   the CHG Soap.  9.  Pat yourself dry with a clean towel.  10.  Wear clean pajamas.            11.  Place clean sheets on your bed the night of your first shower and do not sleep with pets.  Day of Surgery  Do not apply any lotions/deodorants the morning of surgery.  Please wear clean clothes to the hospital/surgery center.  Please read over the following fact sheets that you were given. Pain Booklet, Coughing and Deep Breathing, MRSA Information and Surgical Site Infection Prevention

## 2016-08-01 ENCOUNTER — Inpatient Hospital Stay (HOSPITAL_COMMUNITY): Payer: Medicare Other

## 2016-08-01 ENCOUNTER — Encounter (HOSPITAL_COMMUNITY): Payer: Self-pay | Admitting: *Deleted

## 2016-08-01 ENCOUNTER — Encounter (HOSPITAL_COMMUNITY)
Admission: RE | Disposition: A | Discharge status: Home / Self Care | Payer: Self-pay | Source: Ambulatory Visit | Attending: Thoracic Surgery (Cardiothoracic Vascular Surgery)

## 2016-08-01 ENCOUNTER — Inpatient Hospital Stay (HOSPITAL_COMMUNITY): Payer: Medicare Other | Admitting: Certified Registered"

## 2016-08-01 ENCOUNTER — Inpatient Hospital Stay (HOSPITAL_COMMUNITY)
Admission: RE | Admit: 2016-08-01 | Discharge: 2016-08-08 | DRG: 164 | Disposition: A | Discharge status: Home / Self Care | Payer: Medicare Other | Source: Ambulatory Visit | Attending: Thoracic Surgery (Cardiothoracic Vascular Surgery) | Admitting: Thoracic Surgery (Cardiothoracic Vascular Surgery)

## 2016-08-01 DIAGNOSIS — Z01811 Encounter for preprocedural respiratory examination: Secondary | ICD-10-CM | POA: Diagnosis not present

## 2016-08-01 DIAGNOSIS — Z01818 Encounter for other preprocedural examination: Secondary | ICD-10-CM

## 2016-08-01 DIAGNOSIS — T8182XA Emphysema (subcutaneous) resulting from a procedure, initial encounter: Secondary | ICD-10-CM | POA: Diagnosis not present

## 2016-08-01 DIAGNOSIS — Z01812 Encounter for preprocedural laboratory examination: Secondary | ICD-10-CM | POA: Diagnosis not present

## 2016-08-01 DIAGNOSIS — Z79899 Other long term (current) drug therapy: Secondary | ICD-10-CM | POA: Diagnosis not present

## 2016-08-01 DIAGNOSIS — I480 Paroxysmal atrial fibrillation: Secondary | ICD-10-CM | POA: Diagnosis not present

## 2016-08-01 DIAGNOSIS — F17210 Nicotine dependence, cigarettes, uncomplicated: Secondary | ICD-10-CM | POA: Diagnosis present

## 2016-08-01 DIAGNOSIS — Z902 Acquired absence of lung [part of]: Secondary | ICD-10-CM

## 2016-08-01 DIAGNOSIS — I10 Essential (primary) hypertension: Secondary | ICD-10-CM | POA: Diagnosis not present

## 2016-08-01 DIAGNOSIS — C3492 Malignant neoplasm of unspecified part of left bronchus or lung: Secondary | ICD-10-CM | POA: Diagnosis present

## 2016-08-01 DIAGNOSIS — Z825 Family history of asthma and other chronic lower respiratory diseases: Secondary | ICD-10-CM | POA: Diagnosis not present

## 2016-08-01 DIAGNOSIS — R0602 Shortness of breath: Secondary | ICD-10-CM | POA: Diagnosis not present

## 2016-08-01 DIAGNOSIS — Z0183 Encounter for blood typing: Secondary | ICD-10-CM | POA: Diagnosis not present

## 2016-08-01 DIAGNOSIS — R911 Solitary pulmonary nodule: Secondary | ICD-10-CM | POA: Diagnosis not present

## 2016-08-01 DIAGNOSIS — J439 Emphysema, unspecified: Secondary | ICD-10-CM | POA: Diagnosis not present

## 2016-08-01 DIAGNOSIS — Z801 Family history of malignant neoplasm of trachea, bronchus and lung: Secondary | ICD-10-CM

## 2016-08-01 DIAGNOSIS — J939 Pneumothorax, unspecified: Secondary | ICD-10-CM | POA: Diagnosis not present

## 2016-08-01 DIAGNOSIS — J95812 Postprocedural air leak: Secondary | ICD-10-CM | POA: Diagnosis not present

## 2016-08-01 DIAGNOSIS — C3412 Malignant neoplasm of upper lobe, left bronchus or lung: Secondary | ICD-10-CM | POA: Diagnosis not present

## 2016-08-01 DIAGNOSIS — J9811 Atelectasis: Secondary | ICD-10-CM | POA: Diagnosis not present

## 2016-08-01 DIAGNOSIS — Z4682 Encounter for fitting and adjustment of non-vascular catheter: Secondary | ICD-10-CM | POA: Diagnosis not present

## 2016-08-01 DIAGNOSIS — Z09 Encounter for follow-up examination after completed treatment for conditions other than malignant neoplasm: Secondary | ICD-10-CM

## 2016-08-01 DIAGNOSIS — R079 Chest pain, unspecified: Secondary | ICD-10-CM | POA: Diagnosis not present

## 2016-08-01 HISTORY — PX: VIDEO ASSISTED THORACOSCOPY (VATS)/ LOBECTOMY: SHX6169

## 2016-08-01 HISTORY — DX: Tobacco use: Z72.0

## 2016-08-01 LAB — APTT: aPTT: 30 seconds (ref 24–36)

## 2016-08-01 LAB — CBC
HEMATOCRIT: 41.2 % (ref 39.0–52.0)
HEMOGLOBIN: 13.3 g/dL (ref 13.0–17.0)
MCH: 27.6 pg (ref 26.0–34.0)
MCHC: 32.3 g/dL (ref 30.0–36.0)
MCV: 85.5 fL (ref 78.0–100.0)
Platelets: 192 10*3/uL (ref 150–400)
RBC: 4.82 MIL/uL (ref 4.22–5.81)
RDW: 14.4 % (ref 11.5–15.5)
WBC: 17 10*3/uL — ABNORMAL HIGH (ref 4.0–10.5)

## 2016-08-01 LAB — FUNGUS CULTURE WITH STAIN

## 2016-08-01 LAB — BLOOD GAS, ARTERIAL
Acid-Base Excess: 1.4 mmol/L (ref 0.0–2.0)
BICARBONATE: 26.6 mmol/L (ref 20.0–28.0)
Drawn by: 441371
O2 Content: 3 L/min
O2 Saturation: 95.7 %
PATIENT TEMPERATURE: 97.8
PCO2 ART: 49.7 mmHg — AB (ref 32.0–48.0)
PO2 ART: 79.8 mmHg — AB (ref 83.0–108.0)
pH, Arterial: 7.345 — ABNORMAL LOW (ref 7.350–7.450)

## 2016-08-01 LAB — CREATININE, SERUM
CREATININE: 1.25 mg/dL — AB (ref 0.61–1.24)
GFR calc Af Amer: >60 mL/min (ref >60)
GFR calc non Af Amer: 58 mL/min — ABNORMAL LOW (ref >60)

## 2016-08-01 LAB — FUNGAL ORGANISM REFLEX

## 2016-08-01 LAB — POCT I-STAT 4, (NA,K, GLUC, HGB,HCT)
GLUCOSE: 124 mg/dL — AB (ref 65–99)
HEMATOCRIT: 40 % (ref 39.0–52.0)
HEMOGLOBIN: 13.6 g/dL (ref 13.0–17.0)
POTASSIUM: 5.1 mmol/L (ref 3.5–5.1)
Sodium: 138 mmol/L (ref 135–145)

## 2016-08-01 LAB — FUNGUS CULTURE RESULT

## 2016-08-01 LAB — GLUCOSE, CAPILLARY: GLUCOSE-CAPILLARY: 125 mg/dL — AB (ref 65–99)

## 2016-08-01 LAB — PROTIME-INR
INR: 0.87
PROTHROMBIN TIME: 11.8 s (ref 11.4–15.2)

## 2016-08-01 LAB — MAGNESIUM: Magnesium: 1.8 mg/dL (ref 1.7–2.4)

## 2016-08-01 SURGERY — VIDEO ASSISTED THORACOSCOPY (VATS)/ LOBECTOMY
Anesthesia: General | Site: Chest | Laterality: Left

## 2016-08-01 MED ORDER — DEXTROSE-NACL 5-0.9 % IV SOLN
INTRAVENOUS | Status: DC
  Administered 2016-08-01 – 2016-08-03 (×4): via INTRAVENOUS

## 2016-08-01 MED ORDER — FENTANYL 40 MCG/ML IV SOLN
INTRAVENOUS | Status: AC
  Administered 2016-08-01: 70 ug via INTRAVENOUS
  Administered 2016-08-01: 15:00:00 via INTRAVENOUS
  Administered 2016-08-02: 60 ug via INTRAVENOUS
  Administered 2016-08-02: 10 ug via INTRAVENOUS
  Administered 2016-08-02: 50 ug via INTRAVENOUS
  Administered 2016-08-02: 30 ug via INTRAVENOUS
  Administered 2016-08-02: 130 ug via INTRAVENOUS
  Administered 2016-08-02 – 2016-08-03 (×2): 20 ug via INTRAVENOUS
  Administered 2016-08-03 (×2): 70 ug via INTRAVENOUS
  Administered 2016-08-03: 20 ug via INTRAVENOUS
  Administered 2016-08-03: 120 ug via INTRAVENOUS
  Administered 2016-08-03: 60 ug via INTRAVENOUS
  Administered 2016-08-04: 30 ug via INTRAVENOUS
  Administered 2016-08-04: 120 ug via INTRAVENOUS
  Administered 2016-08-04: 80 ug via INTRAVENOUS
  Administered 2016-08-04: 20 ug via INTRAVENOUS
  Administered 2016-08-04: 50 ug via INTRAVENOUS
  Administered 2016-08-04: 80 ug via INTRAVENOUS
  Administered 2016-08-05: 50 ug via INTRAVENOUS
  Administered 2016-08-05: 70 ug via INTRAVENOUS
  Administered 2016-08-05: 50 ug via INTRAVENOUS
  Administered 2016-08-05: 30 ug via INTRAVENOUS
  Filled 2016-08-01: qty 25

## 2016-08-01 MED ORDER — LACTATED RINGERS IV SOLN
INTRAVENOUS | Status: DC | PRN
  Administered 2016-08-01 (×2): via INTRAVENOUS

## 2016-08-01 MED ORDER — ONDANSETRON HCL 4 MG/2ML IJ SOLN: 4.0000 mg | Freq: Four times a day (QID) | INTRAMUSCULAR | Status: DC | PRN

## 2016-08-01 MED ORDER — FENTANYL CITRATE (PF) 250 MCG/5ML IJ SOLN
INTRAMUSCULAR | Status: AC
  Filled 2016-08-01: qty 5

## 2016-08-01 MED ORDER — SCOPOLAMINE 1 MG/3DAYS TD PT72
MEDICATED_PATCH | TRANSDERMAL | Status: DC | PRN
  Administered 2016-08-01: 1 via TRANSDERMAL

## 2016-08-01 MED ORDER — BUPIVACAINE 0.5 % ON-Q PUMP SINGLE CATH 400 ML
INJECTION | Status: DC | PRN
  Administered 2016-08-01: 400 mL

## 2016-08-01 MED ORDER — DEXMEDETOMIDINE HCL 200 MCG/2ML IV SOLN
INTRAVENOUS | Status: DC | PRN
  Administered 2016-08-01: 4 ug via INTRAVENOUS
  Administered 2016-08-01 (×2): 8 ug via INTRAVENOUS

## 2016-08-01 MED ORDER — DIPHENHYDRAMINE HCL 12.5 MG/5ML PO ELIX: 12.5000 mg | ORAL_SOLUTION | Freq: Four times a day (QID) | ORAL | Status: DC | PRN

## 2016-08-01 MED ORDER — PANTOPRAZOLE SODIUM 40 MG PO TBEC
40.0000 mg | DELAYED_RELEASE_TABLET | Freq: Every day | ORAL | Status: DC
  Administered 2016-08-02 – 2016-08-08 (×7): 40 mg via ORAL
  Filled 2016-08-01 (×7): qty 1

## 2016-08-01 MED ORDER — ROCURONIUM BROMIDE 100 MG/10ML IV SOLN
INTRAVENOUS | Status: DC | PRN
  Administered 2016-08-01: 20 mg via INTRAVENOUS
  Administered 2016-08-01: 30 mg via INTRAVENOUS
  Administered 2016-08-01 (×3): 50 mg via INTRAVENOUS

## 2016-08-01 MED ORDER — SENNOSIDES-DOCUSATE SODIUM 8.6-50 MG PO TABS
1.0000 | ORAL_TABLET | Freq: Every day | ORAL | Status: DC
  Administered 2016-08-02 – 2016-08-07 (×4): 1 via ORAL
  Filled 2016-08-01 (×5): qty 1

## 2016-08-01 MED ORDER — FENTANYL CITRATE (PF) 250 MCG/5ML IJ SOLN
INTRAMUSCULAR | Status: AC
Start: 2016-08-01 — End: 2016-08-01
  Filled 2016-08-01: qty 5

## 2016-08-01 MED ORDER — INSULIN ASPART 100 UNIT/ML ~~LOC~~ SOLN
0.0000 [IU] | Freq: Four times a day (QID) | SUBCUTANEOUS | Status: DC
  Administered 2016-08-01 – 2016-08-02 (×2): 2 [IU] via SUBCUTANEOUS

## 2016-08-01 MED ORDER — MIDAZOLAM HCL 2 MG/2ML IJ SOLN
INTRAMUSCULAR | Status: AC
  Filled 2016-08-01: qty 2

## 2016-08-01 MED ORDER — HYDROMORPHONE HCL 1 MG/ML IJ SOLN: 0.2500 mg | INTRAMUSCULAR | Status: DC | PRN

## 2016-08-01 MED ORDER — DIPHENHYDRAMINE HCL 50 MG/ML IJ SOLN: 12.5000 mg | Freq: Four times a day (QID) | INTRAMUSCULAR | Status: DC | PRN

## 2016-08-01 MED ORDER — LIDOCAINE 2% (20 MG/ML) 5 ML SYRINGE
INTRAMUSCULAR | Status: AC
  Filled 2016-08-01: qty 10

## 2016-08-01 MED ORDER — PROPOFOL 10 MG/ML IV BOLUS
INTRAVENOUS | Status: AC
  Filled 2016-08-01: qty 20

## 2016-08-01 MED ORDER — TRAMADOL HCL 50 MG PO TABS
50.0000 mg | ORAL_TABLET | Freq: Four times a day (QID) | ORAL | Status: DC | PRN
  Administered 2016-08-02 – 2016-08-07 (×3): 100 mg via ORAL
  Filled 2016-08-01 (×3): qty 2

## 2016-08-01 MED ORDER — DEXAMETHASONE SODIUM PHOSPHATE 10 MG/ML IJ SOLN
INTRAMUSCULAR | Status: DC | PRN
  Administered 2016-08-01: 10 mg via INTRAVENOUS

## 2016-08-01 MED ORDER — HEMOSTATIC AGENTS (NO CHARGE) OPTIME
TOPICAL | Status: DC | PRN
  Administered 2016-08-01: 1 via TOPICAL

## 2016-08-01 MED ORDER — ARTIFICIAL TEARS OP OINT
TOPICAL_OINTMENT | OPHTHALMIC | Status: DC | PRN
  Administered 2016-08-01: 1 via OPHTHALMIC

## 2016-08-01 MED ORDER — ARTIFICIAL TEARS OP OINT
TOPICAL_OINTMENT | OPHTHALMIC | Status: AC
  Filled 2016-08-01: qty 3.5

## 2016-08-01 MED ORDER — MEPERIDINE HCL 25 MG/ML IJ SOLN: 6.2500 mg | INTRAMUSCULAR | Status: DC | PRN

## 2016-08-01 MED ORDER — DEXMEDETOMIDINE HCL IN NACL 400 MCG/100ML IV SOLN
INTRAVENOUS | Status: DC | PRN
  Administered 2016-08-01: .5 ug/kg/h via INTRAVENOUS

## 2016-08-01 MED ORDER — ONDANSETRON HCL 4 MG/2ML IJ SOLN: 4.0000 mg | Freq: Once | INTRAMUSCULAR | Status: DC | PRN

## 2016-08-01 MED ORDER — DEXTROSE 5 % IV SOLN
1.5000 g | INTRAVENOUS | Status: AC
  Administered 2016-08-01 (×2): 1.5 g via INTRAVENOUS
  Filled 2016-08-01: qty 1.5

## 2016-08-01 MED ORDER — BUPIVACAINE HCL (PF) 0.5 % IJ SOLN
INTRAMUSCULAR | Status: AC
  Filled 2016-08-01: qty 10

## 2016-08-01 MED ORDER — BISACODYL 5 MG PO TBEC
10.0000 mg | DELAYED_RELEASE_TABLET | Freq: Every day | ORAL | Status: DC
  Administered 2016-08-01 – 2016-08-08 (×6): 10 mg via ORAL
  Filled 2016-08-01 (×8): qty 2

## 2016-08-01 MED ORDER — FENTANYL 40 MCG/ML IV SOLN
INTRAVENOUS | Status: AC
  Administered 2016-08-01: 30 ug
  Filled 2016-08-01: qty 25

## 2016-08-01 MED ORDER — LEVALBUTEROL HCL 0.63 MG/3ML IN NEBU
0.6300 mg | INHALATION_SOLUTION | Freq: Four times a day (QID) | RESPIRATORY_TRACT | Status: DC | PRN
  Administered 2016-08-01 – 2016-08-03 (×4): 0.63 mg via RESPIRATORY_TRACT
  Filled 2016-08-01 (×4): qty 3

## 2016-08-01 MED ORDER — SUCCINYLCHOLINE CHLORIDE 200 MG/10ML IV SOSY
PREFILLED_SYRINGE | INTRAVENOUS | Status: AC
  Filled 2016-08-01: qty 10

## 2016-08-01 MED ORDER — OXYCODONE HCL 5 MG PO TABS
5.0000 mg | ORAL_TABLET | ORAL | Status: DC | PRN
  Administered 2016-08-02 – 2016-08-05 (×7): 10 mg via ORAL
  Administered 2016-08-05: 5 mg via ORAL
  Administered 2016-08-06: 10 mg via ORAL
  Administered 2016-08-06: 5 mg via ORAL
  Administered 2016-08-06: 10 mg via ORAL
  Administered 2016-08-06: 5 mg via ORAL
  Administered 2016-08-07 – 2016-08-08 (×3): 10 mg via ORAL
  Filled 2016-08-01 (×5): qty 2
  Filled 2016-08-01: qty 1
  Filled 2016-08-01 (×2): qty 2
  Filled 2016-08-01: qty 1
  Filled 2016-08-01 (×5): qty 2
  Filled 2016-08-01: qty 1
  Filled 2016-08-01: qty 2

## 2016-08-01 MED ORDER — CEFUROXIME SODIUM 1.5 G IJ SOLR
1.5000 g | Freq: Two times a day (BID) | INTRAMUSCULAR | Status: AC
  Administered 2016-08-01 – 2016-08-02 (×2): 1.5 g via INTRAVENOUS
  Filled 2016-08-01 (×3): qty 1.5

## 2016-08-01 MED ORDER — DEXTROSE 5 % IV SOLN
1.5000 g | INTRAVENOUS | Status: DC
  Filled 2016-08-01: qty 1.5

## 2016-08-01 MED ORDER — 0.9 % SODIUM CHLORIDE (POUR BTL) OPTIME
TOPICAL | Status: DC | PRN
  Administered 2016-08-01: 1000 mL

## 2016-08-01 MED ORDER — PROPOFOL 10 MG/ML IV BOLUS
INTRAVENOUS | Status: DC | PRN
  Administered 2016-08-01: 20 mg via INTRAVENOUS
  Administered 2016-08-01: 80 mg via INTRAVENOUS
  Administered 2016-08-01: 20 mg via INTRAVENOUS

## 2016-08-01 MED ORDER — LISINOPRIL 10 MG PO TABS
10.0000 mg | ORAL_TABLET | Freq: Every day | ORAL | Status: DC
  Administered 2016-08-02 – 2016-08-08 (×7): 10 mg via ORAL
  Filled 2016-08-01 (×7): qty 1

## 2016-08-01 MED ORDER — MIDAZOLAM HCL 5 MG/5ML IJ SOLN
INTRAMUSCULAR | Status: DC | PRN
  Administered 2016-08-01 (×2): 1 mg via INTRAVENOUS
  Administered 2016-08-01: 2 mg via INTRAVENOUS

## 2016-08-01 MED ORDER — ROCURONIUM BROMIDE 10 MG/ML (PF) SYRINGE
PREFILLED_SYRINGE | INTRAVENOUS | Status: AC
  Filled 2016-08-01: qty 30

## 2016-08-01 MED ORDER — SCOPOLAMINE 1 MG/3DAYS TD PT72
MEDICATED_PATCH | TRANSDERMAL | Status: AC
  Filled 2016-08-01: qty 1

## 2016-08-01 MED ORDER — ONDANSETRON HCL 4 MG/2ML IJ SOLN
4.0000 mg | Freq: Four times a day (QID) | INTRAMUSCULAR | Status: DC | PRN
  Administered 2016-08-02: 4 mg via INTRAVENOUS
  Filled 2016-08-01: qty 2

## 2016-08-01 MED ORDER — SUGAMMADEX SODIUM 200 MG/2ML IV SOLN
INTRAVENOUS | Status: DC | PRN
  Administered 2016-08-01: 200 mg via INTRAVENOUS

## 2016-08-01 MED ORDER — SODIUM CHLORIDE 0.9% FLUSH
9.0000 mL | INTRAVENOUS | Status: DC | PRN
  Administered 2016-08-02: 9 mL via INTRAVENOUS
  Filled 2016-08-01: qty 9

## 2016-08-01 MED ORDER — PHENYLEPHRINE HCL 10 MG/ML IJ SOLN
INTRAVENOUS | Status: DC | PRN
  Administered 2016-08-01: 25 ug/min via INTRAVENOUS

## 2016-08-01 MED ORDER — BUPIVACAINE HCL (PF) 0.5 % IJ SOLN
INTRAMUSCULAR | Status: DC | PRN
  Administered 2016-08-01: 10 mL

## 2016-08-01 MED ORDER — DEXAMETHASONE SODIUM PHOSPHATE 10 MG/ML IJ SOLN
INTRAMUSCULAR | Status: AC
  Filled 2016-08-01: qty 1

## 2016-08-01 MED ORDER — POTASSIUM CHLORIDE 10 MEQ/50ML IV SOLN: 10.0000 meq | Freq: Every day | INTRAVENOUS | Status: DC | PRN

## 2016-08-01 MED ORDER — PHENYLEPHRINE HCL 10 MG/ML IJ SOLN
INTRAMUSCULAR | Status: DC | PRN
  Administered 2016-08-01 (×6): 80 ug via INTRAVENOUS
  Administered 2016-08-01: 120 ug via INTRAVENOUS
  Administered 2016-08-01: 80 ug via INTRAVENOUS

## 2016-08-01 MED ORDER — LIDOCAINE HCL (CARDIAC) 20 MG/ML IV SOLN
INTRAVENOUS | Status: DC | PRN
  Administered 2016-08-01: 100 mg via INTRAVENOUS

## 2016-08-01 MED ORDER — BUPIVACAINE 0.5 % ON-Q PUMP SINGLE CATH 400 ML
400.0000 mL | INJECTION | Status: DC
  Filled 2016-08-01: qty 400

## 2016-08-01 MED ORDER — PHENYLEPHRINE 40 MCG/ML (10ML) SYRINGE FOR IV PUSH (FOR BLOOD PRESSURE SUPPORT)
PREFILLED_SYRINGE | INTRAVENOUS | Status: AC
  Filled 2016-08-01: qty 30

## 2016-08-01 MED ORDER — FENTANYL CITRATE (PF) 100 MCG/2ML IJ SOLN
INTRAMUSCULAR | Status: DC | PRN
  Administered 2016-08-01 (×2): 50 ug via INTRAVENOUS
  Administered 2016-08-01: 150 ug via INTRAVENOUS
  Administered 2016-08-01 (×2): 50 ug via INTRAVENOUS
  Administered 2016-08-01: 100 ug via INTRAVENOUS
  Administered 2016-08-01: 50 ug via INTRAVENOUS

## 2016-08-01 MED ORDER — ACETAMINOPHEN 500 MG PO TABS
1000.0000 mg | ORAL_TABLET | Freq: Four times a day (QID) | ORAL | Status: AC
  Administered 2016-08-01 – 2016-08-06 (×12): 1000 mg via ORAL
  Filled 2016-08-01 (×15): qty 2

## 2016-08-01 MED ORDER — ACETAMINOPHEN 160 MG/5ML PO SOLN
1000.0000 mg | Freq: Four times a day (QID) | ORAL | Status: AC
  Administered 2016-08-05 (×2): 1000 mg via ORAL
  Filled 2016-08-01 (×2): qty 40.6

## 2016-08-01 MED ORDER — NALOXONE HCL 0.4 MG/ML IJ SOLN: 0.4000 mg | INTRAMUSCULAR | Status: DC | PRN

## 2016-08-01 SURGICAL SUPPLY — 89 items
APPLICATOR TIP EXT COSEAL (VASCULAR PRODUCTS) ×3 IMPLANT
APPLIER CLIP 5 13 M/L LIGAMAX5 (MISCELLANEOUS) ×3
APPLIER CLIP ROT 10 11.4 M/L (STAPLE)
CANISTER SUCTION 2500CC (MISCELLANEOUS) ×3 IMPLANT
CATH KIT ON Q 5IN SLV (PAIN MANAGEMENT) IMPLANT
CATH THORACIC 28FR (CATHETERS) ×3 IMPLANT
CATH THORACIC 28FR RT ANG (CATHETERS) IMPLANT
CATH THORACIC 36FR (CATHETERS) IMPLANT
CATH THORACIC 36FR RT ANG (CATHETERS) IMPLANT
CLIP APPLIE 5 13 M/L LIGAMAX5 (MISCELLANEOUS) ×1 IMPLANT
CLIP APPLIE ROT 10 11.4 M/L (STAPLE) IMPLANT
CLIP TI MEDIUM 6 (CLIP) ×3 IMPLANT
CONN ST 1/4X3/8  BEN (MISCELLANEOUS) ×2
CONN ST 1/4X3/8 BEN (MISCELLANEOUS) ×1 IMPLANT
CONN Y 3/8X3/8X3/8  BEN (MISCELLANEOUS)
CONN Y 3/8X3/8X3/8 BEN (MISCELLANEOUS) IMPLANT
CONT SPEC 4OZ CLIKSEAL STRL BL (MISCELLANEOUS) ×27 IMPLANT
COVER MAYO STAND STRL (DRAPES) ×6 IMPLANT
COVER SURGICAL LIGHT HANDLE (MISCELLANEOUS) IMPLANT
CUTTER ECHEON FLEX ENDO 45 340 (ENDOMECHANICALS) ×3 IMPLANT
DERMABOND ADVANCED (GAUZE/BANDAGES/DRESSINGS) ×2
DERMABOND ADVANCED .7 DNX12 (GAUZE/BANDAGES/DRESSINGS) ×1 IMPLANT
DRAIN CHANNEL 28F RND 3/8 FF (WOUND CARE) ×3 IMPLANT
DRAIN CHANNEL 32F RND 10.7 FF (WOUND CARE) IMPLANT
DRAPE LAPAROSCOPIC ABDOMINAL (DRAPES) ×3 IMPLANT
DRAPE WARM FLUID 44X44 (DRAPE) ×3 IMPLANT
ELECT BLADE 6.5 EXT (BLADE) ×3 IMPLANT
ELECT REM PT RETURN 9FT ADLT (ELECTROSURGICAL) ×3
ELECTRODE REM PT RTRN 9FT ADLT (ELECTROSURGICAL) ×1 IMPLANT
GAUZE SPONGE 4X4 12PLY STRL (GAUZE/BANDAGES/DRESSINGS) ×3 IMPLANT
GLOVE SURG SIGNA 7.5 PF LTX (GLOVE) ×6 IMPLANT
GOWN STRL REUS W/ TWL LRG LVL3 (GOWN DISPOSABLE) ×2 IMPLANT
GOWN STRL REUS W/ TWL XL LVL3 (GOWN DISPOSABLE) ×1 IMPLANT
GOWN STRL REUS W/TWL LRG LVL3 (GOWN DISPOSABLE) ×4
GOWN STRL REUS W/TWL XL LVL3 (GOWN DISPOSABLE) ×2
HEMOSTAT SURGICEL 2X14 (HEMOSTASIS) IMPLANT
KIT BASIN OR (CUSTOM PROCEDURE TRAY) ×3 IMPLANT
KIT ROOM TURNOVER OR (KITS) ×3 IMPLANT
KIT SUCTION CATH 14FR (SUCTIONS) ×3 IMPLANT
NS IRRIG 1000ML POUR BTL (IV SOLUTION) ×9 IMPLANT
PACK CHEST (CUSTOM PROCEDURE TRAY) ×3 IMPLANT
PAD ARMBOARD 7.5X6 YLW CONV (MISCELLANEOUS) ×6 IMPLANT
POUCH ENDO CATCH II 15MM (MISCELLANEOUS) ×3 IMPLANT
POUCH SPECIMEN RETRIEVAL 10MM (ENDOMECHANICALS) IMPLANT
RELOAD GOLD ECHELON 45 (STAPLE) ×48 IMPLANT
RELOAD GREEN ECHELON 45 (STAPLE) ×3 IMPLANT
SCISSORS ENDO CVD 5DCS (MISCELLANEOUS) IMPLANT
SEALANT PROGEL (MISCELLANEOUS) IMPLANT
SEALANT SURG COSEAL 4ML (VASCULAR PRODUCTS) IMPLANT
SEALANT SURG COSEAL 8ML (VASCULAR PRODUCTS) IMPLANT
SOLUTION ANTI FOG 6CC (MISCELLANEOUS) ×3 IMPLANT
SPECIMEN JAR MEDIUM (MISCELLANEOUS) IMPLANT
SPONGE GAUZE 4X4 12PLY STER LF (GAUZE/BANDAGES/DRESSINGS) ×3 IMPLANT
SPONGE INTESTINAL PEANUT (DISPOSABLE) ×6 IMPLANT
SPONGE TONSIL 1 RF SGL (DISPOSABLE) ×9 IMPLANT
STAPLE RELOAD 2.5MM WHITE (STAPLE) ×24 IMPLANT
STAPLER VASCULAR ECHELON 35 (CUTTER) ×3 IMPLANT
SUT PROLENE 4 0 RB 1 (SUTURE)
SUT PROLENE 4-0 RB1 .5 CRCL 36 (SUTURE) IMPLANT
SUT SILK  1 MH (SUTURE) ×6
SUT SILK 1 MH (SUTURE) ×3 IMPLANT
SUT SILK 1 TIES 10X30 (SUTURE) ×3 IMPLANT
SUT SILK 2 0 SH (SUTURE) IMPLANT
SUT SILK 2 0SH CR/8 30 (SUTURE) IMPLANT
SUT SILK 3 0 SH 30 (SUTURE) ×3 IMPLANT
SUT SILK 3 0SH CR/8 30 (SUTURE) IMPLANT
SUT VIC AB 1 CTX 36 (SUTURE) ×4
SUT VIC AB 1 CTX36XBRD ANBCTR (SUTURE) ×2 IMPLANT
SUT VIC AB 2-0 CTX 36 (SUTURE) ×6 IMPLANT
SUT VIC AB 2-0 UR6 27 (SUTURE) ×3 IMPLANT
SUT VIC AB 3-0 MH 27 (SUTURE) IMPLANT
SUT VIC AB 3-0 X1 27 (SUTURE) ×3 IMPLANT
SUT VICRYL 2 TP 1 (SUTURE) IMPLANT
SWAB COLLECTION DEVICE MRSA (MISCELLANEOUS) IMPLANT
SYRINGE 10CC LL (SYRINGE) ×3 IMPLANT
SYSTEM SAHARA CHEST DRAIN ATS (WOUND CARE) ×3 IMPLANT
TAPE CLOTH 4X10 WHT NS (GAUZE/BANDAGES/DRESSINGS) ×3 IMPLANT
TAPE CLOTH SURG 4X10 WHT LF (GAUZE/BANDAGES/DRESSINGS) ×3 IMPLANT
TIP APPLICATOR SPRAY EXTEND 16 (VASCULAR PRODUCTS) IMPLANT
TOWEL OR 17X24 6PK STRL BLUE (TOWEL DISPOSABLE) ×3 IMPLANT
TOWEL OR 17X26 10 PK STRL BLUE (TOWEL DISPOSABLE) ×3 IMPLANT
TOWEL OR NON WOVEN STRL DISP B (DISPOSABLE) ×3 IMPLANT
TRAP SPECIMEN MUCOUS 40CC (MISCELLANEOUS) IMPLANT
TRAY FOLEY CATH 16FRSI W/METER (SET/KITS/TRAYS/PACK) ×3 IMPLANT
TROCAR FLEXIPATH THORACIC 15MM (ENDOMECHANICALS) ×3 IMPLANT
TROCAR XCEL BLADELESS 5X75MML (TROCAR) ×3 IMPLANT
TUBE ANAEROBIC SPECIMEN COL (MISCELLANEOUS) IMPLANT
TUNNELER SHEATH ON-Q 11GX8 DSP (PAIN MANAGEMENT) ×3 IMPLANT
WATER STERILE IRR 1000ML POUR (IV SOLUTION) ×3 IMPLANT

## 2016-08-01 NOTE — Progress Notes (Signed)
Patient finally calmed down. Patient arrived from OR, trying to get up, thrushing in and out of bed, 4-6 RN making sure patient is safe.

## 2016-08-01 NOTE — Anesthesia Postprocedure Evaluation (Signed)
Anesthesia Post Note  Patient: Randall Sullivan  Procedure(s) Performed: Procedure(s) (LRB): VIDEO ASSISTED THORACOSCOPY (VATS)/ LEFT UPPER LOBECTOMY with lymph node sampling and onQ placement (Left)  Patient location during evaluation: PACU Anesthesia Type: General Level of consciousness: awake and alert Pain management: pain level controlled Vital Signs Assessment: post-procedure vital signs reviewed and stable Respiratory status: spontaneous breathing, nonlabored ventilation, respiratory function stable and patient connected to nasal cannula oxygen Cardiovascular status: blood pressure returned to baseline and stable Postop Assessment: no signs of nausea or vomiting Anesthetic complications: no    Last Vitals:  Vitals:   08/01/16 1608 08/01/16 1700  BP:    Pulse:  73  Resp: 14 14  Temp:      Last Pain:  Vitals:   08/01/16 1610  TempSrc:   PainSc: 0-No pain                 Waldon Sheerin DAVID

## 2016-08-01 NOTE — Interval H&P Note (Signed)
History and Physical Interval Note:  Randall Sullivan has decided to proceed with Left VATS, left upper lobectomy, possibly sparing the lingula  08/01/2016 7:54 AM  Randall Sullivan  has presented today for surgery, with the diagnosis of LUL ADENOCARCINOMA  The various methods of treatment have been discussed with the patient and family. After consideration of risks, benefits and other options for treatment, the patient has consented to  Procedure(s): VIDEO ASSISTED THORACOSCOPY (VATS)/ LEFT UPPER LOBECTOMY WITH POSSIBLE LINGULAR SPARING (Left) as a surgical intervention .  The patient's history has been reviewed, patient examined, no change in status, stable for surgery.  I have reviewed the patient's chart and labs.  Questions were answered to the patient's satisfaction.     [name]

## 2016-08-01 NOTE — H&P (View-Only) (Signed)
[address]       [place] [address]             [phone]     HPI: Mr. Satterly returns today for scheduled visit to discuss results of his bronchoscopy.  He is a 64 year old man with a history of tobacco abuse and COPD who was found to have a 3.8 x 1.2 x 2.5 cm spiculated nodule left upper lobe. This was hypermetabolic on PET. There is no evidence of regional or distant metastases. I did a navigational bronchoscopy on 07/03/2016. The intraoperative results were suspicious but not definitive. Final pathology showed well-differentiated adenocarcinoma.  He tolerated the procedure well. He did cough up small amounts of blood for a day or 2 afterwards, but otherwise did not have any problems.  He quit smoking prior to his bronchoscopy. He says is now having a cigarette with a cup of coffee once a day.  Past Medical History:  Diagnosis Date  . Arthritis   . Emphysema   . Hypertension    Past Surgical History:  Procedure Laterality Date  . HERNIA REPAIR Bilateral 8185   umbilical  . VIDEO BRONCHOSCOPY WITH ENDOBRONCHIAL NAVIGATION N/A 07/03/2016   Procedure: VIDEO BRONCHOSCOPY WITH ENDOBRONCHIAL NAVIGATION;  Surgeon: [name], MD;  Location: [facility];  Service: Thoracic;  Laterality: N/A;      Current Outpatient Prescriptions  Medication Sig Dispense Refill  . albuterol (PROVENTIL) (2.5 MG/3ML) 0.083% nebulizer solution USE 1 VIAL PER NEBULIZATION FOUR TIMES DAILY AS NEEDED (Patient taking differently: USE 1 VIAL PER NEBULIZATION FOUR TIMES DAILY AS NEEDED FOR SHORTNESS OF BREATH) 75 mL 5  . fexofenadine (ALLEGRA) 180 MG tablet Take 1 tablet (180 mg total) by mouth daily. (Patient taking differently: Take 180 mg by mouth daily as needed for allergies. ) 30 tablet 11  . lisinopril (PRINIVIL,ZESTRIL) 20 MG tablet TAKE 1 TABLET (20 MG TOTAL) BY MOUTH DAILY. 30 tablet 1  . PROAIR HFA 108 (90 Base) MCG/ACT inhaler INHALE 2 PUFFS INTO THE LUNGS EVERY SIX HOURS  AS NEEDED FOR SHORTNESS OF BREATH 8.5 Inhaler 2  . acetaminophen (TYLENOL) 500 MG tablet Take 500 mg by mouth every 6 (six) hours as needed for mild pain. For pain      No current facility-administered medications for this visit.     Physical Exam BP 140/81   Pulse 74   Resp 16   Ht '5\' 10"'$  (1.778 m)   Wt 174 lb (78.9 kg)   SpO2 96% Comment: ON RA  BMI 24.45 kg/m  65 year old man in no acute distress Alert and oriented 3 with no focal deficits No cervical or supraclavicular adenopathy Lungs clear with diminished but equal breath sounds bilaterally Cardiac regular rate and rhythm normal S1 and S2 Extremities are without clubbing cyanosis or edema  Diagnostic Tests: I again personally reviewed the CT and PET CT. I also again reviewed his pulmonary function testing. His FEV1 is 1.03 (30% of predicted), and improves to 1.25 (37%) with bronchodilators. DLCO is 52% of predicted. He has upper lobe predominant emphysema. Pathology= well-differentiated adenocarcinoma  Impression: Mr. 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 I discussed the pathology results with Mr. and Mrs. Gradilla.  We discussed options for treatment including surgical resection and stereotactic radiation. I discussed the relative advantages and disadvantages of each approach. I do think his cure rate  is higher with anatomical surgical resection, but surgical risk is higher as well. Radiation is generally well tolerated, avoids surgical risk, but has a lower chance of local control and overall survival. I described the nature of the operation to him. We will plan to do a left upper lobectomy but hopefully can do a lingular sparing lobectomy. He understands the general nature of the operation, the need for general anesthesia, the incisions to be used, use of drainage tubes postoperatively, the expected hospital stay, and  the overall recovery. He understands that surgery does not guaranteed a cure. I informed him of the indications, risks, benefits, and alternatives. He understands the risk include, but are not limited to death, MI, DVT, PE, bleeding, possible need for transfusion, infection, prolonged air leak, cardiac arrhythmias, as well as the possibility of other unforeseeable complications. He is a high risk for preoperative pulmonary complications and respiratory failure requiring ventilation.  He wishes to think his options over for a couple of days. He sees Dr. Lake Bells on Thursday. He will let us know if he would like to proceed. If he would prefer to proceed with radiation we will arrange an appointment with radiation oncology.  Plan: Left VATS, left upper lobectomy (possible lingular sparing) if he decides to proceed.  [name], MD [facility] [phone]

## 2016-08-01 NOTE — Anesthesia Procedure Notes (Signed)
Procedure Name: Intubation Date/Time: 08/01/2016 8:19 AM Performed by: Gaylene Brooks Pre-anesthesia Checklist: Patient identified Patient Re-evaluated:Patient Re-evaluated prior to inductionOxygen Delivery Method: Circle system utilized Preoxygenation: Pre-oxygenation with 100% oxygen Intubation Type: IV induction Ventilation: Oral airway inserted - appropriate to patient size Laryngoscope Size: Mac and 3 Grade View: Grade I Endobronchial tube: Double lumen EBT and 39 Fr Number of attempts: 1 Airway Equipment and Method: Stylet Placement Confirmation: ETT inserted through vocal cords under direct vision,  positive ETCO2 and breath sounds checked- equal and bilateral Tube secured with: Tape Dental Injury: Teeth and Oropharynx as per pre-operative assessment

## 2016-08-01 NOTE — Progress Notes (Signed)
Patient arrived to PACU at 1406, finally calmed down after 45 mins. Precedex drip started by CRNA at .38mg/kg/h but down to .237m/kg/h. Phenylephrine at 202mmin.

## 2016-08-01 NOTE — Progress Notes (Signed)
Phenylephrine up to 44mg/min.per Dr. OConrad [place]

## 2016-08-01 NOTE — Transfer of Care (Signed)
Immediate Anesthesia Transfer of Care Note  Patient: Randall Sullivan  Procedure(s) Performed: Procedure(s): VIDEO ASSISTED THORACOSCOPY (VATS)/ LEFT UPPER LOBECTOMY with lymph node sampling and onQ placement (Left)  Patient Location: PACU  Anesthesia Type:General  Level of Consciousness: awake, pateint uncooperative and confused  Airway & Oxygen Therapy: Patient Spontanous Breathing and Patient connected to face mask oxygen  Post-op Assessment: Report given to RN, Post -op Vital signs reviewed and stable and Patient moving all extremities X 4  Post vital signs: Reviewed and stable  Last Vitals:  Vitals:   08/01/16 0616  BP: 133/77  Pulse: 73  Resp: 20  Temp: 36.6 C    Last Pain:  Vitals:   08/01/16 0616  TempSrc: Oral      Patients Stated Pain Goal: 2 (50/09/38 1829)  Complications: No apparent anesthesia complications

## 2016-08-01 NOTE — Anesthesia Preprocedure Evaluation (Signed)
Anesthesia Evaluation  Patient identified by MRN, date of birth, ID band Patient awake    Reviewed: Allergy & Precautions, NPO status , Patient's Chart, lab work & pertinent test results  Airway Mallampati: I  TM Distance: >3 FB Neck ROM: Full    Dental   Pulmonary COPD, former smoker,    Pulmonary exam normal        Cardiovascular hypertension, Pt. on medications Normal cardiovascular exam     Neuro/Psych    GI/Hepatic   Endo/Other    Renal/GU      Musculoskeletal   Abdominal   Peds  Hematology   Anesthesia Other Findings   Reproductive/Obstetrics                             Anesthesia Physical Anesthesia Plan  ASA: III  Anesthesia Plan: General   Post-op Pain Management:    Induction: Intravenous  Airway Management Planned: Double Lumen EBT  Additional Equipment: Arterial line, CVP and Ultrasound Guidance Line Placement  Intra-op Plan:   Post-operative Plan: Extubation in OR  Informed Consent: I have reviewed the patients History and Physical, chart, labs and discussed the procedure including the risks, benefits and alternatives for the proposed anesthesia with the patient or authorized representative who has indicated his/her understanding and acceptance.     Plan Discussed with: CRNA and Surgeon  Anesthesia Plan Comments:         Anesthesia Quick Evaluation

## 2016-08-01 NOTE — Progress Notes (Signed)
CT surgery p.m. Rounds  Status post left upper lobectomy earlier today Pulmonary status stable Minimal chest tube drainage

## 2016-08-01 NOTE — Brief Op Note (Addendum)
08/01/2016  1:36 PM  PATIENT:  Randall Sullivan  65 y.o. male  PRE-OPERATIVE DIAGNOSIS:  LUL ADENOCARCINOMA- Clinical stage IA  POST-OPERATIVE DIAGNOSIS:  LUL ADENOCARCINOMA- Clinical stage IA  PROCEDURE:   LEFT VIDEO ASSISTED THORACOSCOPY (VATS),  THORACOSCOPIC LEFT UPPER LOBECTOMY MEDIASTINALLYMPH NODE DISSECTION, and  On-Q Local Anesthetic catheter placement (Left)  SURGEON:  Surgeon(s) and Role:    * [name], MD - Primary  PHYSICIAN ASSISTANT: Lars Pinks PA-C  ANESTHESIA:   general  EBL:  Total I/O In: 1000 [I.V.:1000] Out: 500 [Urine:200; Blood:300]  BLOOD ADMINISTERED:none  DRAINS: Chest tube and Blake drain placed in the left pleural space   LOCAL MEDICATIONS USED:  MARCAINE     SPECIMEN:  Source of Specimen:  LUL, multiple lymph nodes. Margin negative for cancer  DISPOSITION OF SPECIMEN:  PATHOLOGY  COUNTS CORRECT:  YES  DICTATION: .Dragon Dictation  PLAN OF CARE: Admit to inpatient   PATIENT DISPOSITION:  PACU - hemodynamically stable.   Delay start of Pharmacological VTE agent (>24hrs) due to surgical blood loss or risk of bleeding: yes  Bronchial margin free of tumor

## 2016-08-02 ENCOUNTER — Encounter (HOSPITAL_COMMUNITY): Payer: Self-pay | Admitting: Thoracic Surgery (Cardiothoracic Vascular Surgery)

## 2016-08-02 ENCOUNTER — Inpatient Hospital Stay (HOSPITAL_COMMUNITY): Payer: Medicare Other

## 2016-08-02 LAB — CBC
HCT: 40 % (ref 39.0–52.0)
HEMOGLOBIN: 12.7 g/dL — AB (ref 13.0–17.0)
MCH: 27.2 pg (ref 26.0–34.0)
MCHC: 31.8 g/dL (ref 30.0–36.0)
MCV: 85.7 fL (ref 78.0–100.0)
PLATELETS: 199 10*3/uL (ref 150–400)
RBC: 4.67 MIL/uL (ref 4.22–5.81)
RDW: 14.6 % (ref 11.5–15.5)
WBC: 13.8 10*3/uL — AB (ref 4.0–10.5)

## 2016-08-02 LAB — BLOOD GAS, ARTERIAL
ACID-BASE EXCESS: 1.1 mmol/L (ref 0.0–2.0)
Bicarbonate: 25.6 mmol/L (ref 20.0–28.0)
DRAWN BY: 46203
FIO2: 32
O2 Saturation: 94.9 %
PATIENT TEMPERATURE: 98.6
PCO2 ART: 43.6 mmHg (ref 32.0–48.0)
pH, Arterial: 7.387 (ref 7.350–7.450)
pO2, Arterial: 72.5 mmHg — ABNORMAL LOW (ref 83.0–108.0)

## 2016-08-02 LAB — BASIC METABOLIC PANEL
Anion gap: 7 (ref 5–15)
BUN: 18 mg/dL (ref 6–20)
CALCIUM: 8.2 mg/dL — AB (ref 8.9–10.3)
CO2: 26 mmol/L (ref 22–32)
Chloride: 101 mmol/L (ref 101–111)
Creatinine, Ser: 0.91 mg/dL (ref 0.61–1.24)
Glucose, Bld: 106 mg/dL — ABNORMAL HIGH (ref 65–99)
Potassium: 4.4 mmol/L (ref 3.5–5.1)
Sodium: 134 mmol/L — ABNORMAL LOW (ref 135–145)

## 2016-08-02 LAB — GLUCOSE, CAPILLARY: Glucose-Capillary: 108 mg/dL — ABNORMAL HIGH (ref 65–99)

## 2016-08-02 MED ORDER — AMIODARONE LOAD VIA INFUSION
150.0000 mg | Freq: Once | INTRAVENOUS | Status: AC
  Administered 2016-08-02: 150 mg via INTRAVENOUS
  Filled 2016-08-02: qty 83.34

## 2016-08-02 MED ORDER — PNEUMOCOCCAL VAC POLYVALENT 25 MCG/0.5ML IJ INJ
0.5000 mL | INJECTION | INTRAMUSCULAR | Status: DC | PRN
Start: 2016-08-02 — End: 2016-08-08

## 2016-08-02 MED ORDER — LORATADINE 10 MG PO TABS
10.0000 mg | ORAL_TABLET | Freq: Every day | ORAL | Status: DC | PRN
  Administered 2016-08-07: 10 mg via ORAL
  Filled 2016-08-02: qty 1

## 2016-08-02 MED ORDER — AMIODARONE HCL IN DEXTROSE 360-4.14 MG/200ML-% IV SOLN
30.0000 mg/h | INTRAVENOUS | Status: AC
  Administered 2016-08-03 – 2016-08-04 (×2): 30 mg/h via INTRAVENOUS
  Filled 2016-08-02 (×5): qty 200

## 2016-08-02 MED ORDER — ENOXAPARIN SODIUM 40 MG/0.4ML ~~LOC~~ SOLN
40.0000 mg | SUBCUTANEOUS | Status: DC
  Administered 2016-08-02 – 2016-08-05 (×4): 40 mg via SUBCUTANEOUS
  Filled 2016-08-02 (×4): qty 0.4

## 2016-08-02 MED ORDER — ORAL CARE MOUTH RINSE
15.0000 mL | Freq: Two times a day (BID) | OROMUCOSAL | Status: DC
  Administered 2016-08-03 – 2016-08-08 (×5): 15 mL via OROMUCOSAL

## 2016-08-02 MED ORDER — AMIODARONE HCL IN DEXTROSE 360-4.14 MG/200ML-% IV SOLN
60.0000 mg/h | INTRAVENOUS | Status: AC
  Administered 2016-08-02 – 2016-08-03 (×2): 60 mg/h via INTRAVENOUS
  Filled 2016-08-02 (×2): qty 200

## 2016-08-02 NOTE — Plan of Care (Signed)
Transferred to 3304 via WC, O2 and monitor. Placed in chair, CT's to 20 cm suction, SCD's with pt. RN to receive in room.

## 2016-08-02 NOTE — Progress Notes (Signed)
1 Day Post-Op Procedure(s) (LRB): VIDEO ASSISTED THORACOSCOPY (VATS)/ LEFT UPPER LOBECTOMY with lymph node sampling and onQ placement (Left) Subjective: Some pain, using PCA  Objective: Vital signs in last 24 hours: Temp:  [97 F (36.1 C)-98.4 F (36.9 C)] 97.8 F (36.6 C) (11/03 0400) Pulse Rate:  [57-99] 77 (11/03 0700) Resp:  [11-25] 20 (11/03 0740) BP: (74-145)/(48-85) 113/77 (11/03 0700) SpO2:  [97 %-100 %] 99 % (11/03 0740) Arterial Line BP: (37-351)/(30-348) 88/83 (11/02 1630)  Hemodynamic parameters for last 24 hours:    Intake/Output from previous day: 11/02 0701 - 11/03 0700 In: 2773.3 [P.O.:360; I.V.:2363.3; IV Piggyback:50] Out: 2140 [Urine:1260; Blood:300; Chest Tube:580] Intake/Output this shift: Total I/O In: 50 [IV Piggyback:50] Out: -   General appearance: alert, cooperative and no distress Neurologic: intact Heart: regular rate and rhythm Lungs: diminished breath sounds bilaterally L>R, no wheezing Abdomen: normal findings: soft, non-tender small air leak  Lab Results:  Recent Labs  08/01/16 1650 08/01/16 1652 08/02/16 0400  WBC 17.0*  --  13.8*  HGB 13.3 13.6 12.7*  HCT 41.2 40.0 40.0  PLT 192  --  199   BMET:  Recent Labs  07/30/16 1543 08/01/16 1650 08/01/16 1652 08/02/16 0400  NA 135  --  138 134*  K 4.4  --  5.1 4.4  CL 104  --   --  101  CO2 21*  --   --  26  GLUCOSE 78  --  124* 106*  BUN 14  --   --  18  CREATININE 0.83 1.25*  --  0.91  CALCIUM 9.5  --   --  8.2*    PT/INR:  Recent Labs  08/01/16 0718  LABPROT 11.8  INR 0.87   ABG    Component Value Date/Time   PHART 7.387 08/02/2016 0605   HCO3 25.6 08/02/2016 0605   O2SAT 94.9 08/02/2016 0605   CBG (last 3)   Recent Labs  08/01/16 2346 08/02/16 0613  GLUCAP 125* 108*    Assessment/Plan: S/P Procedure(s) (LRB): VIDEO ASSISTED THORACOSCOPY (VATS)/ LEFT UPPER LOBECTOMY with lymph node sampling and onQ placement (Left) -POD # 1  Has a small air leak-  keep CT to suction today IS, nebs, cough and deep breathe Pain control- On-Q, PCA, acetaminophen CBG OK and no history of DM- dc CBG, SSI SCD + enoxaparin for DVT prophylaxis Transfer to [facility]    LOS: 1 day    [name] 08/02/2016

## 2016-08-02 NOTE — Progress Notes (Signed)
Notified by central telemetry that Pt was in A-fib. EKG done to verify. MD paged. Pt has no chest pain, pulse in the 120's, blood pressure 117/68, no shortness of breath. MD ordered verbal order with read back for Amiodarone drip with 150 mg initial bolus. Will continue to monitor.

## 2016-08-02 NOTE — Progress Notes (Signed)
Amiodarone Drug - Drug Interaction Consult Note  Recommendations: No interacting medications noted inpatient or on PTA med list. Amiodarone is metabolized by the cytochrome P450 system and therefore has the potential to cause many drug interactions. Amiodarone has an average plasma half-life of 50 days (range 20 to 100 days).   There is potential for drug interactions to occur several weeks or months after stopping treatment and the onset of drug interactions may be slow after initiating amiodarone.   '[]'$  Statins: Increased risk of myopathy. Simvastatin- restrict dose to '20mg'$  daily. Other statins: counsel patients to report any muscle pain or weakness immediately.  '[]'$  Anticoagulants: Amiodarone can increase anticoagulant effect. Consider warfarin dose reduction. Patients should be monitored closely and the dose of anticoagulant altered accordingly, remembering that amiodarone levels take several weeks to stabilize.  '[]'$  Antiepileptics: Amiodarone can increase plasma concentration of phenytoin, the dose should be reduced. Note that small changes in phenytoin dose can result in large changes in levels. Monitor patient and counsel on signs of toxicity.  '[]'$  Beta blockers: increased risk of bradycardia, AV block and myocardial depression. Sotalol - avoid concomitant use.  '[]'$   Calcium channel blockers (diltiazem and verapamil): increased risk of bradycardia, AV block and myocardial depression.  '[]'$   Cyclosporine: Amiodarone increases levels of cyclosporine. Reduced dose of cyclosporine is recommended.  '[]'$  Digoxin dose should be halved when amiodarone is started.  '[]'$  Diuretics: increased risk of cardiotoxicity if hypokalemia occurs.  '[]'$  Oral hypoglycemic agents (glyburide, glipizide, glimepiride): increased risk of hypoglycemia. Patient's glucose levels should be monitored closely when initiating amiodarone therapy.   '[]'$  Drugs that prolong the QT interval:  Torsades de pointes risk may be increased  with concurrent use - avoid if possible.  Monitor QTc, also keep magnesium/potassium WNL if concurrent therapy can't be avoided. [name] Antibiotics: e.g. fluoroquinolones, erythromycin. . Antiarrhythmics: e.g. quinidine, procainamide, disopyramide, sotalol. . Antipsychotics: e.g. phenothiazines, haloperidol.  . Lithium, tricyclic antidepressants, and methadone. Thank You,   Elicia Lamp, PharmD, BCPS Clinical Pharmacist 08/02/2016 9:41 PM

## 2016-08-02 NOTE — Op Note (Signed)
NAMEABRHAM, MASLOWSKI NO.:  [id number]  MEDICAL RECORD NO.:  [id number]  LOCATION:  2S08C                        FACILITY:  [facility]  PHYSICIAN:  Revonda Standard. Roxan Hockey, M.D.DATE OF BIRTH:  Feb 18, 1951  DATE OF PROCEDURE:  08/01/2016 DATE OF DISCHARGE:                              OPERATIVE REPORT   PREOPERATIVE DIAGNOSIS:  Adenocarcinoma, left upper lobe.  POSTOPERATIVE DIAGNOSIS:  Adenocarcinoma, left upper lobe.  PROCEDURES:   Left video-assisted thoracoscopy,  Thoracoscopic left upper lobectomy,  Mediastinal lymph node dissection,  On-Q local anesthetic catheter placement.  SURGEON:  Revonda Standard. Roxan Hockey, M.D.  ASSISTANT:  Lars Pinks, PA  ANESTHESIA:  General.  FINDINGS:  Severe bullous emphysematous disease, upper lobe greater than lower lobe.  Bronchial margin negative for tumor.  CLINICAL NOTE:  Mr. Fancher is a 65 year old gentleman with a history of tobacco abuse and COPD.  He recently was found to have a 3.8 x 1.2 x 2.5- cm spiculated nodule in the left upper lobe.  This was hypermetabolic on PET-CT.  Navigational bronchoscopy showed well-differentiated adenocarcinoma.  He was was offered the option of surgery versus radiation. After hearing the advantages and disadvantages of each of those approaches.  He wished to proceed with left upper lobectomy, possibly lingular sparing if anatomic features were favorable.  The indications, risks, benefits, and alternatives were discussed in detail with the patient and he accepted the risks and agreed to proceed.  OPERATIVE NOTE:  Mr. Bowdish was brought to the preoperative holding area on August 01, 2016.  A central line was placed and an arterial blood pressure monitoring line was placed.  Intravenous antibiotics were administered.  He was taken to the operating room, anesthetized and intubated with a double-lumen endotracheal tube.  Sequential compression devices were placed on the calves for DVT  prophylaxis.  A Foley catheter was placed.  He was placed in a right lateral decubitus position and the left chest was prepped and draped in usual sterile fashion.  Single lung ventilation of the right lung was initiated and was tolerated well throughout the procedure.  An incision was made in the seventh intercostal space in the midaxillary line.  A 5-mm port was inserted into the chest.  The thoracoscope was advanced into the chest.  There was good isolation of the left lung, although it was very slow to deflate.  There was no cross ventilation. There was no pleural effusion and no abnormality of the visceral or parietal pleura that was evident.  There were some adhesions of the upper lobe to the chest wall, these were taken down with electrocautery. A working incision was made in the fourth interspace anterolaterally. This was 5 cm in length. No rib spreading was performed during the procedure.  Additional accessory port incisions were made. One was anterior to the first. The other was posterior below the tip of the scapula for division of the bronchus.  The lung was inspected.  The fissure was incomplete.  The inferior ligament was divided with electrocautery.  All lymph nodes that were encountered during the dissection were removed and sent as separate specimens for permanent pathology.  The pleural reflection then was divided anteriorly and  posteriorly at the hilum and superiorly as well.  The upper lobe pulmonary vein branches were dissected out.  The lingular branches were identified and preserved at this point in the procedure.  The remaining vein branches were dissected out, encircled and divided with the endoscopic vascular stapler.  An attempt was made to dissect into the fissure, but there was no plane. The fissure was completed between the lingula and the lower lobe with sequential firings of the endoscopic stapler. A 45-mm Echelon powered stapler with gold cartridges was  used for the parenchymal division.  Additional nodes were identified in the aortopulmonary window and subcarinal space during the dissection around the hilum.  These nodes were sent as separate specimens for pathology.  The dissection was difficult both due to the incomplete fissure as well as the multiple blebs in the upper lobe.  It was determined that the lingular vessels would need to be divided to be able to complete a lobectomy with adequate margins.  Therefore, the lingular vein branches were divided. A lingular arterial branch was visible in the fissure. It was dissected out, encircled, and divided with the vascular stapler.  Four additional pulmonary arterial branches were dissected out individually and divided with the vascular stapler.  The fissure was completed again using the 45- mm stapler with gold cartridges.  A flexible port was placed through the posterior stab incision below the tip of the scapula and the 45-mm stapler with a green cartridge was placed across the left upper lobe bronchus and closed.  A test inflation showed good aeration of the lower lobe, no aeration of the upper lobe.  The stapler was fired transecting the bronchus.  The upper lobe was placed into an endoscopic retrieval bag, removed, and sent for frozen section of the bronchial margins, which came back negative for tumor.  The chest was copiously irrigated with warm saline.  A test inflation to 30 cm of water revealed no leakage from the bronchial stump.  There was some air leakage from the fissure.  CoSeal was applied to these areas. An On-Q local anesthetic catheter was placed through a separate stab incision posteriorly and tunneled into a subpleural location, it was primed with 5 mL of 0.5% Marcaine.  A 28-French Blake drain was placed through the original port incision and a 28-French chest tube was placed through the more anterior port incision.  The posterior port incision was closed in standard  fashion.  The lower lobe was reinflated.  The working incision was closed with a #1 Vicryl fascial suture followed by subcutaneous and skin closure in the standard fashion.  The chest tubes were placed to a Pleur-evac suction at 20 cm.  The patient was placed back in the supine position.  He was extubated in the operating room. The patient was combative and was briefly reintubated, but ultimately extubated and then taken to the postanesthetic care unit in good condition.     Revonda Standard Roxan Hockey, M.D.     SCH/MEDQ  D:  08/01/2016  T:  08/02/2016  Job:  [id number]

## 2016-08-02 NOTE — Plan of Care (Signed)
Report to 3300 RN

## 2016-08-03 ENCOUNTER — Inpatient Hospital Stay (HOSPITAL_COMMUNITY): Payer: Medicare Other

## 2016-08-03 LAB — CBC
HEMATOCRIT: 38.8 % — AB (ref 39.0–52.0)
HEMOGLOBIN: 12.1 g/dL — AB (ref 13.0–17.0)
MCH: 26.9 pg (ref 26.0–34.0)
MCHC: 31.2 g/dL (ref 30.0–36.0)
MCV: 86.4 fL (ref 78.0–100.0)
Platelets: 178 10*3/uL (ref 150–400)
RBC: 4.49 MIL/uL (ref 4.22–5.81)
RDW: 14.6 % (ref 11.5–15.5)
WBC: 13.9 10*3/uL — ABNORMAL HIGH (ref 4.0–10.5)

## 2016-08-03 LAB — COMPREHENSIVE METABOLIC PANEL
ALK PHOS: 36 U/L — AB (ref 38–126)
ALT: 11 U/L — AB (ref 17–63)
AST: 18 U/L (ref 15–41)
Albumin: 2.7 g/dL — ABNORMAL LOW (ref 3.5–5.0)
Anion gap: 6 (ref 5–15)
BILIRUBIN TOTAL: 0.7 mg/dL (ref 0.3–1.2)
BUN: 12 mg/dL (ref 6–20)
CALCIUM: 8 mg/dL — AB (ref 8.9–10.3)
CO2: 27 mmol/L (ref 22–32)
CREATININE: 0.84 mg/dL (ref 0.61–1.24)
Chloride: 100 mmol/L — ABNORMAL LOW (ref 101–111)
Glucose, Bld: 103 mg/dL — ABNORMAL HIGH (ref 65–99)
Potassium: 4.3 mmol/L (ref 3.5–5.1)
Sodium: 133 mmol/L — ABNORMAL LOW (ref 135–145)
TOTAL PROTEIN: 5.4 g/dL — AB (ref 6.5–8.1)

## 2016-08-03 MED ORDER — LEVALBUTEROL HCL 0.63 MG/3ML IN NEBU
0.6300 mg | INHALATION_SOLUTION | Freq: Four times a day (QID) | RESPIRATORY_TRACT | Status: DC
  Administered 2016-08-03 – 2016-08-04 (×3): 0.63 mg via RESPIRATORY_TRACT
  Filled 2016-08-03 (×3): qty 3

## 2016-08-03 NOTE — Progress Notes (Addendum)
      [address]       [place] [address]             [phone]      2 Days Post-Op Procedure(s) (LRB): VIDEO ASSISTED THORACOSCOPY (VATS)/ LEFT UPPER LOBECTOMY with lymph node sampling and onQ placement (Left) Subjective: Feels okay, but sore  Objective: Vital signs in last 24 hours: Temp:  [97.1 F (36.2 C)-98.9 F (37.2 C)] 98.2 F (36.8 C) (11/04 0700) Pulse Rate:  [73-125] 82 (11/04 1100) Cardiac Rhythm: Normal sinus rhythm (11/04 0700) Resp:  [13-27] 16 (11/04 1100) BP: (89-119)/(53-94) 103/64 (11/04 1100) SpO2:  [89 %-98 %] 96 % (11/04 1100)     Intake/Output from previous day: 11/03 0701 - 11/04 0700 In: 2582.1 [P.O.:1080; I.V.:1452.1; IV Piggyback:50] Out: 2175 [Urine:1615; Chest Tube:560] Intake/Output this shift: No intake/output data recorded.  General appearance: alert, cooperative and no distress Heart: regular rate and rhythm, S1, S2 normal, no murmur, click, rub or gallop Lungs: clear to auscultation bilaterally Abdomen: soft, non-tender; bowel sounds normal; no masses,  no organomegaly Extremities: extremities normal, atraumatic, no cyanosis or edema Wound: clean and dry dressed with sterile dressing  Lab Results:  Recent Labs  08/02/16 0400 08/03/16 0435  WBC 13.8* 13.9*  HGB 12.7* 12.1*  HCT 40.0 38.8*  PLT 199 178   BMET:  Recent Labs  08/02/16 0400 08/03/16 0435  NA 134* 133*  K 4.4 4.3  CL 101 100*  CO2 26 27  GLUCOSE 106* 103*  BUN 18 12  CREATININE 0.91 0.84  CALCIUM 8.2* 8.0*    PT/INR:  Recent Labs  08/01/16 0718  LABPROT 11.8  INR 0.87   ABG    Component Value Date/Time   PHART 7.387 08/02/2016 0605   HCO3 25.6 08/02/2016 0605   O2SAT 94.9 08/02/2016 0605   CBG (last 3)   Recent Labs  08/01/16 2346 08/02/16 0613  GLUCAP 125* 108*    Assessment/Plan: S/P Procedure(s) (LRB): VIDEO ASSISTED THORACOSCOPY (VATS)/ LEFT UPPER LOBECTOMY with lymph node sampling and onQ placement  (Left)  CXR reviewed-no pneumothorax. Stable study. Both chest tubes remain in place. Some increase in subcutaneous air. 569m/24 hours Pain control- On-Q remains in place. PCA, tylenol SCD and lovenox for DVT prophylaxis.  Amio initiated last night. Back in NSR in the 80s.   Plan: Keep chest tube due to small air leak and increased output. Will review CXR with attending. Continue Amio for rhythm control.     LOS: 2 days    TElgie Collard11/12/2015   Chart reviewed, patient examined, agree with above. I do not see any leak this afternoon. Suction decreased to 10 cm. If CXR ok in the am will remove one of tubes and put the other to water seal. Sinus rhythm on IV amio

## 2016-08-04 ENCOUNTER — Inpatient Hospital Stay (HOSPITAL_COMMUNITY): Payer: Medicare Other

## 2016-08-04 MED ORDER — LEVALBUTEROL HCL 0.63 MG/3ML IN NEBU
0.6300 mg | INHALATION_SOLUTION | Freq: Four times a day (QID) | RESPIRATORY_TRACT | Status: DC | PRN
  Administered 2016-08-05 – 2016-08-08 (×4): 0.63 mg via RESPIRATORY_TRACT
  Filled 2016-08-04 (×5): qty 3

## 2016-08-04 MED ORDER — GUAIFENESIN ER 600 MG PO TB12
1200.0000 mg | ORAL_TABLET | Freq: Two times a day (BID) | ORAL | Status: DC
  Administered 2016-08-04 – 2016-08-08 (×9): 1200 mg via ORAL
  Filled 2016-08-04 (×8): qty 2

## 2016-08-04 MED ORDER — LEVALBUTEROL HCL 0.63 MG/3ML IN NEBU
0.6300 mg | INHALATION_SOLUTION | Freq: Three times a day (TID) | RESPIRATORY_TRACT | Status: DC
  Administered 2016-08-04 – 2016-08-08 (×10): 0.63 mg via RESPIRATORY_TRACT
  Filled 2016-08-04 (×10): qty 3

## 2016-08-04 MED ORDER — AMIODARONE IV BOLUS ONLY 150 MG/100ML
150.0000 mg | Freq: Once | INTRAVENOUS | Status: AC
  Administered 2016-08-04: 150 mg via INTRAVENOUS
  Filled 2016-08-04: qty 100

## 2016-08-04 MED ORDER — AMIODARONE IV BOLUS ONLY 150 MG/100ML
150.0000 mg | Freq: Once | INTRAVENOUS | Status: AC
  Administered 2016-08-04: 150 mg via INTRAVENOUS

## 2016-08-04 NOTE — Progress Notes (Signed)
Per night shift nurse patient converted back to A-fib this morning post ambulating around the unit. Patient continues to have elevated heart rate with variations in rhythm. Dr. Cyndia Bent paged, new orders received will implement and continue to monitor. Patient also has increase in air leak when chest tubes assessed this morning.

## 2016-08-04 NOTE — Progress Notes (Signed)
3 Days Post-Op Procedure(s) (LRB): VIDEO ASSISTED THORACOSCOPY (VATS)/ LEFT UPPER LOBECTOMY with lymph node sampling and onQ placement (Left) Subjective:  No specific complaints  Objective: Vital signs in last 24 hours: Temp:  [97.5 F (36.4 C)-99 F (37.2 C)] 98.3 F (36.8 C) (11/05 0740) Pulse Rate:  [76-133] 133 (11/05 0740) Cardiac Rhythm: Sinus tachycardia (11/05 0740) Resp:  [16-22] 21 (11/05 0740) BP: (98-112)/(47-73) 106/73 (11/05 0740) SpO2:  [93 %-100 %] 97 % (11/05 0836)  Hemodynamic parameters for last 24 hours:    Intake/Output from previous day: 11/04 0701 - 11/05 0700 In: 1200 [I.V.:1200] Out: 1910 [Urine:1350; Chest Tube:560] Intake/Output this shift: Total I/O In: 240 [P.O.:240] Out: -   General appearance: alert and cooperative Heart: irregularly irregular rhythm and tachy Lungs: few rhonchi on the left Wound: dressings dry tidaling but no air leak seen  Lab Results:  Recent Labs  08/02/16 0400 08/03/16 0435  WBC 13.8* 13.9*  HGB 12.7* 12.1*  HCT 40.0 38.8*  PLT 199 178   BMET:  Recent Labs  08/02/16 0400 08/03/16 0435  NA 134* 133*  K 4.4 4.3  CL 101 100*  CO2 26 27  GLUCOSE 106* 103*  BUN 18 12  CREATININE 0.91 0.84  CALCIUM 8.2* 8.0*    PT/INR: No results for input(s): LABPROT, INR in the last 72 hours. ABG    Component Value Date/Time   PHART 7.387 08/02/2016 0605   HCO3 25.6 08/02/2016 0605   O2SAT 94.9 08/02/2016 0605   CBG (last 3)   Recent Labs  08/01/16 2346 08/02/16 0613  GLUCAP 125* 108*   CLINICAL DATA:  Shortness of breath and chest pain  EXAM: PORTABLE CHEST 1 VIEW  COMPARISON:  08/03/2016  FINDINGS: Cardiac shadow is stable. Postoperative changes are noted on the left. Thoracostomy catheters are again noted. No pneumothorax is seen. A right jugular central line is seen. The right lung is hyperinflated.  IMPRESSION: Postoperative change without acute abnormality.   Electronically  Signed   By: Inez Catalina M.D.   On: 08/04/2016 08:18  Assessment/Plan: S/P Procedure(s) (LRB): VIDEO ASSISTED THORACOSCOPY (VATS)/ LEFT UPPER LOBECTOMY with lymph node sampling and onQ placement (Left)  Postop atrial fibrillation with RVR. He was in sinus on amio but went back into atrial fib this am with rate 130's. I gave him a bolus of amio which slowed him down but now back up 120. Will give another bolus and continue drip. He may need some cardizem or lopressor in addition to [facility]. Consider anticoagulation but will discuss with [facility].   Respiratory status stable. Continue IS, ambulation as tolerated. Add mucinex.  Remove posterior chest tube and continue anterior tube to water seal.     LOS: 3 days    Gaye Pollack 08/04/2016

## 2016-08-04 NOTE — Progress Notes (Signed)
Posterior chest tube removed per MD order, patient tolerated procedure well. Will continue to monitor.

## 2016-08-05 ENCOUNTER — Inpatient Hospital Stay (HOSPITAL_COMMUNITY): Payer: Medicare Other

## 2016-08-05 DIAGNOSIS — Z902 Acquired absence of lung [part of]: Secondary | ICD-10-CM

## 2016-08-05 MED ORDER — AMIODARONE HCL 200 MG PO TABS
400.0000 mg | ORAL_TABLET | Freq: Two times a day (BID) | ORAL | Status: DC
  Administered 2016-08-05 – 2016-08-08 (×7): 400 mg via ORAL
  Filled 2016-08-05 (×7): qty 2

## 2016-08-05 NOTE — Progress Notes (Signed)
Wasted 10 ml of fentanyl in sharps witnessed by [employer]

## 2016-08-05 NOTE — Progress Notes (Signed)
4 Days Post-Op Procedure(s) (LRB): VIDEO ASSISTED THORACOSCOPY (VATS)/ LEFT UPPER LOBECTOMY with lymph node sampling and onQ placement (Left) Subjective: C/o pain  Objective: Vital signs in last 24 hours: Temp:  [98 F (36.7 C)-99.1 F (37.3 C)] 98.5 F (36.9 C) (11/06 0408) Pulse Rate:  [73-137] 73 (11/06 0408) Cardiac Rhythm: Normal sinus rhythm (11/06 0408) Resp:  [18-22] 21 (11/06 0408) BP: (112-129)/(63-81) 129/77 (11/06 0408) SpO2:  [91 %-99 %] 96 % (11/06 0408)  Hemodynamic parameters for last 24 hours:    Intake/Output from previous day: 11/05 0701 - 11/06 0700 In: 1123 [P.O.:480; I.V.:643] Out: 1360 [Urine:1300; Chest Tube:60] Intake/Output this shift: No intake/output data recorded.  General appearance: alert, cooperative and no distress Neurologic: intact Heart: regular rate and rhythm Lungs: diminished breath sounds left base Wound: clean and dry extreme tidal variation, no air leak  Lab Results:  Recent Labs  08/03/16 0435  WBC 13.9*  HGB 12.1*  HCT 38.8*  PLT 178   BMET:  Recent Labs  08/03/16 0435  NA 133*  K 4.3  CL 100*  CO2 27  GLUCOSE 103*  BUN 12  CREATININE 0.84  CALCIUM 8.0*    PT/INR: No results for input(s): LABPROT, INR in the last 72 hours. ABG    Component Value Date/Time   PHART 7.387 08/02/2016 0605   HCO3 25.6 08/02/2016 0605   O2SAT 94.9 08/02/2016 0605   CBG (last 3)  No results for input(s): GLUCAP in the last 72 hours.  Assessment/Plan: S/P Procedure(s) (LRB): VIDEO ASSISTED THORACOSCOPY (VATS)/ LEFT UPPER LOBECTOMY with lymph node sampling and onQ placement (Left) -  CV- atrial fibrillation- converted to SR with amiodarone  Change amiodarone to PO  RESP- continue IS, nebs. No air leak- will dc chest tube  RENAL- creatinine OK, recheck BMET tomorrow  DVT prophylaxis- SCD + enoxaparin   LOS: 4 days    [name] 08/05/2016

## 2016-08-05 NOTE — Progress Notes (Signed)
Patient placed on bed in right lateral position.  Left chest tube removed without any complications.  Dressing applied.  Patient tolerated well.

## 2016-08-05 NOTE — Discharge Summary (Signed)
Physician Discharge Summary  Patient ID: Randall Sullivan MRN: [id number] DOB/AGE: 1951/01/17 65 y.o.  Admit date: 08/01/2016 Discharge date: 08/08/2016  Admission Diagnoses: Adenocarcinoma left upper lobe Patient Active Problem List   Diagnosis Date Noted  . Cancer of left lung ([place]) 08/01/2016  . Lung nodule 06/11/2016  . Cigarette smoker 06/11/2016  . Emphysema of lung ([place]) 06/11/2016  . Essential (primary) hypertension 10/06/2014   Discharge Diagnoses:  T1b,N0 stage IA adenocarcinoma left upper lobe Patient Active Problem List   Diagnosis Date Noted  . S/P lobectomy of lung 08/05/2016  . Cancer of left lung ([place]) 08/01/2016  . Lung nodule 06/11/2016  . Cigarette smoker 06/11/2016  . Emphysema of lung ([place]) 06/11/2016  . Essential (primary) hypertension 10/06/2014  Post op atrial fibrillation  Discharged Condition: good  History of Present Illness:  Randall Sullivan is a 65 yo male with known history of COPD, Hypertension, and tobacco abuse.  In September the patient presented with complaints of a "Sinus Problem."  He developed nasal congestion, sneezing, and coughing.  He was concerned he was getting pneumonia and presented for evaluation.  CXR was obtained and revealed a nodule in the left upper lobe.  Further workup with CT scan showed the nodule to be 3.8 x 1.2 x 2.5 cm.  There was also some questionable adenopathy.  PET CT scan was also obtained and showed the lesion to be hypermetabolic.  Due to this he was referred to [facility] for surgical evaluation.  He was initially evaluated by Dr. Roxan Hockey on 06/21/2016 at which time the patient was offered a navigational biopsy.  The risks and benefits of the procedure were explained to the patient and he was agreeable to proceed.  This was performed on 07/03/2016 and pathology confirmed the presence of well differentiated adenocarcinoma.  He was again seen by Dr. Roxan Hockey on 07/09/2016 at which time he recommended they proceed with Left Upper  Lobectomy.  The risks and benefits of the procedure were explained to the patient and he was agreeable to proceed.   Hospital Course:   Randall Sullivan presented to [facility] on [facility].  He was taken to the operating room and underwent Left Video Assisted Thoracoscopy with Left Upper Lobectomy, Mediastinal Lymph Node dissection, and placement of ON- Q local anesthesia catheter.  He tolerated the procedure without difficulty, was extubated, and taken to the PACU in stable condition.  The patient developed Atrial Fibrillation and was started on Amiodarone bolus and drip.  His chest tubes initially demonstrated and air leak and were left on suction POD #1.  The patient converted to NSR after treatment with Amiodarone.  There was no evidence of an air leak and his chest tubes were transitioned to 10 CM suction on POD #2.  The patient again developed atrial fibrillation.  He required additional treatment with IV Amiodarone.  His posterior chest tube was removed and his remaining chest tube was placed on water seal on POD #3.  Follow up CXR remains stable.  His final chest tube was removed on POD #4. Repeat CXR shows stable appearance of the loculated air and fluid collection in the upper left hemithorax. Stable hyperinflation of the right lung. Persistent subcutaneous emphysema in the left axillary region  He had intermittent atrial fibrillation so he was started on Eliquis. A cardiology consult was obtained on 11/08. There were no changes to medications. Patient will need to make a follow up appointment with Dr. Percival Spanish after discharge. He is ambulating independently and on room  air.  His pain is well controlled.  Central line was removed on 11/09. He is felt medically stable for discharge home today.            Significant Diagnostic Studies: nuclear medicine: PET  3.3 x 1.4 cm hypermetabolic subsolid left upper lobe pulmonary nodule, compatible with primary bronchogenic neoplasm.  No findings  suspicious for metastatic disease.  XAM: CHEST  2 VIEW  COMPARISON:  PA and lateral chest x-ray of August 06, 2016.  FINDINGS: The right lung is well-expanded. There is shift of the mediastinum toward the left which is stable. There is a stable air-fluid level in the upper aspect of the left hemi thorax. The interstitial markings within the aerated portion of the left lung are mildly prominent though stable. A trace of pleural fluid may be present at the left lung base. The heart is normal in size. The pulmonary vascularity is not engorged. The right internal jugular venous catheter tip projects over the midportion of the SVC. The observed bony thorax exhibits no acute abnormality.  IMPRESSION: Stable appearance of the loculated air and fluid collection in the upper left hemithorax. Stable hyperinflation of the right lung. Persistent subcutaneous emphysema in the left axillary region with decreased volume overall as compared to yesterday's study.   Electronically Signed   By: David  Martinique M.D.   On: 08/07/2016 08:56  Pathology: 1. Lymph node, biopsy, L 9 - THERE IS NO EVIDENCE OF CARCINOMA IN 1 OF 1 LYMPH NODE (0/1). 2. Lymph node, biopsy, L 9 #2 - THERE IS NO EVIDENCE OF CARCINOMA IN 1 OF 1 LYMPH NODE (0/1). 3. Lymph node, biopsy, L 10 - THERE IS NO EVIDENCE OF CARCINOMA IN 1 OF 1 LYMPH NODE (0/1). 4. Lymph node, biopsy, L 5 - THERE IS NO EVIDENCE OF CARCINOMA IN 1 OF 1 LYMPH NODE (0/1). 5. Lymph node, biopsy, L 7 - THERE IS NO EVIDENCE OF CARCINOMA IN 1 OF 1 LYMPH NODE (0/1). 6. Lymph node, biopsy, L 7 #2 - THERE IS NO EVIDENCE OF CARCINOMA IN 1 OF 1 LYMPH NODE (0/1). 7. Lymph node, biopsy, L 11 - THERE IS NO EVIDENCE OF CARCINOMA IN 1 OF 1 LYMPH NODE (0/1). 8. Lymph node, biopsy, L11 #2 - THERE IS NO EVIDENCE OF CARCINOMA IN 1 OF 1 LYMPH NODE (0/1). 9. Lung, resection (segmental or lobe), Left Upper Lobe - INVASIVE ADENOCARCINOMA, WELL DIFFERENTIATED, SPANNING  2.5 CM. - THERE IS NO EVIDENCE OF CARCINOMA IN 4 OF 4 LYMPH NODES (0/4) - THE SURGICAL RESECTION MARGINS TNM code: pT1b, pN0  Treatments: surgery:   Left video-assisted thoracoscopy, thoracoscopic left upper lobectomy, mediastinal lymph node dissection, On-Q local anesthetic catheter placement.  Disposition: 01-Home or Self Care   Discharge medications:     Medication List    TAKE these medications   acetaminophen 500 MG tablet Commonly known as:  TYLENOL Take 1,000 mg by mouth every 6 (six) hours as needed (for arthritis pain.).   albuterol (2.5 MG/3ML) 0.083% nebulizer solution Commonly known as:  PROVENTIL USE 1 VIAL PER NEBULIZATION FOUR TIMES DAILY AS NEEDED What changed:  See the new instructions.   albuterol 108 (90 Base) MCG/ACT inhaler Commonly known as:  PROVENTIL HFA;VENTOLIN HFA Inhale 2 puffs into the lungs every 6 (six) hours as needed for wheezing or shortness of breath. What changed:  Another medication with the same name was changed. Make sure you understand how and when to take each.   amiodarone 200 MG tablet Commonly known  as:  PACERONE Take 2 tablets (400 mg total) by mouth 2 (two) times daily. For 2 days;then take Amiodarone 200 mg by mouth two times daily for 2 weeks;then take Amiodarone 200 mg by mouth Daily thereafter.   apixaban 5 MG Tabs tablet Commonly known as:  ELIQUIS Take 1 tablet (5 mg total) by mouth 2 (two) times daily.   fexofenadine 180 MG tablet Commonly known as:  ALLEGRA Take 1 tablet (180 mg total) by mouth daily. What changed:  when to take this  reasons to take this   guaiFENesin 600 MG 12 hr tablet Commonly known as:  MUCINEX Take 2 tablets (1,200 mg total) by mouth 2 (two) times daily as needed. For cough   lisinopril 20 MG tablet Commonly known as:  PRINIVIL,ZESTRIL TAKE 1 TABLET (20 MG TOTAL) BY MOUTH DAILY.   oxyCODONE 5 MG immediate release tablet Commonly known as:  Oxy IR/ROXICODONE Take 1-2 tablets (5-10 mg  total) by mouth every 4 (four) hours as needed for severe pain.      Follow-up Information    [name], MD Follow up on 08/20/2016.   Specialty:  Cardiothoracic Surgery Why:  Appointment is at 10:45, please get CXR at 10:15 at [facility] imaging located on first floor of our office building Contact information: [address] [place] [address] [phone]        Minus Breeding, MD Follow up.   Specialty:  Cardiology Why:  Call for a follow up appointment 2 weeks regarding atrial fibrillation. Contact information: [place] [address] [phone]           Signed: Nani Skillern PA-C 08/08/2016, 8:44 AM

## 2016-08-05 NOTE — Care Management Note (Addendum)
Case Management Note  Patient Details  Name: Tykwon Fera MRN: [id number] Date of Birth: Sep 14, 1951  Subjective/Objective:   Pod 4 vats, lobectomy, afib, chest tube dc'd,pca dc'd and amiodorone drip dc'd.  For cxr tomorrow, NCM will cont to follow for dc needs.   11/7 chest tubes out, patient is in/out of afib when ambulate, he does not have medicare part D , no medicaiton coverage, will be on Eliquis, NCM will give him Eliquis 30 day savings card, patient ast application and Match letter to help ast with medications at discharge.  NCM will cont to follow for dc needs.                 Action/Plan:   Expected Discharge Date:                  Expected Discharge Plan:  Home/Self Care  In-House Referral:     Discharge planning Services  CM Consult  Post Acute Care Choice:    Choice offered to:     DME Arranged:    DME Agency:     HH Arranged:    HH Agency:     Status of Service:  In process, will continue to follow  If discussed at Long Length of Stay Meetings, dates discussed:    Additional Comments:  Zenon Mayo, RN 08/05/2016, 7:13 PM

## 2016-08-05 NOTE — Care Management Important Message (Signed)
Important Message  Patient Details  Name: Randall Sullivan MRN: [id number] Date of Birth: [birth year]   Medicare Important Message Given:  Yes    Micholas Drumwright [place] 08/05/2016, 12:06 PM

## 2016-08-06 ENCOUNTER — Inpatient Hospital Stay (HOSPITAL_COMMUNITY): Payer: Medicare Other

## 2016-08-06 LAB — CBC
HCT: 36 % — ABNORMAL LOW (ref 39.0–52.0)
Hemoglobin: 11.3 g/dL — ABNORMAL LOW (ref 13.0–17.0)
MCH: 26.8 pg (ref 26.0–34.0)
MCHC: 31.4 g/dL (ref 30.0–36.0)
MCV: 85.3 fL (ref 78.0–100.0)
PLATELETS: 197 10*3/uL (ref 150–400)
RBC: 4.22 MIL/uL (ref 4.22–5.81)
RDW: 14.3 % (ref 11.5–15.5)
WBC: 9.8 10*3/uL (ref 4.0–10.5)

## 2016-08-06 LAB — BASIC METABOLIC PANEL
Anion gap: 10 (ref 5–15)
BUN: 6 mg/dL (ref 6–20)
CO2: 32 mmol/L (ref 22–32)
CREATININE: 0.83 mg/dL (ref 0.61–1.24)
Calcium: 8.7 mg/dL — ABNORMAL LOW (ref 8.9–10.3)
Chloride: 97 mmol/L — ABNORMAL LOW (ref 101–111)
GFR calc Af Amer: >60 mL/min (ref >60)
GFR calc non Af Amer: >60 mL/min (ref >60)
GLUCOSE: 113 mg/dL — AB (ref 65–99)
POTASSIUM: 4.2 mmol/L (ref 3.5–5.1)
Sodium: 139 mmol/L (ref 135–145)

## 2016-08-06 MED ORDER — APIXABAN 5 MG PO TABS
5.0000 mg | ORAL_TABLET | Freq: Two times a day (BID) | ORAL | Status: DC
  Administered 2016-08-06 – 2016-08-08 (×5): 5 mg via ORAL
  Filled 2016-08-06 (×5): qty 1

## 2016-08-06 NOTE — Progress Notes (Signed)
Phone call from radiology regarding 2 view chest x ray results, Dr. Leonarda Salon office then called and message left regarding results.

## 2016-08-06 NOTE — Progress Notes (Signed)
5 Days Post-Op Procedure(s) (LRB): VIDEO ASSISTED THORACOSCOPY (VATS)/ LEFT UPPER LOBECTOMY with lymph node sampling and onQ placement (Left) Subjective: Some incisional pain, better after CT removed yesterday Transient atrial fib into 140s yesterday  Objective: Vital signs in last 24 hours: Temp:  [97.4 F (36.3 C)-98.5 F (36.9 C)] 98.1 F (36.7 C) (11/07 0743) Pulse Rate:  [73-83] 80 (11/07 0743) Cardiac Rhythm: Normal sinus rhythm (11/07 0743) Resp:  [13-21] 21 (11/07 0743) BP: (113-135)/(75-84) 135/75 (11/07 0743) SpO2:  [92 %-98 %] 92 % (11/07 0743)  Hemodynamic parameters for last 24 hours:    Intake/Output from previous day: 11/06 0701 - 11/07 0700 In: 253.4 [P.O.:120; I.V.:133.4] Out: 900 [Urine:900] Intake/Output this shift: Total I/O In: -  Out: 50 [Urine:50]  General appearance: alert, cooperative and no distress Neurologic: intact Heart: regular rate and rhythm Lungs: diminished breath sounds on left and faint wheezing bilaterally Wound: clean and dry  Lab Results:  Recent Labs  08/06/16 0352  WBC 9.8  HGB 11.3*  HCT 36.0*  PLT 197   BMET:  Recent Labs  08/06/16 0352  NA 139  K 4.2  CL 97*  CO2 32  GLUCOSE 113*  BUN 6  CREATININE 0.83  CALCIUM 8.7*    PT/INR: No results for input(s): LABPROT, INR in the last 72 hours. ABG    Component Value Date/Time   PHART 7.387 08/02/2016 0605   HCO3 25.6 08/02/2016 0605   O2SAT 94.9 08/02/2016 0605   CBG (last 3)  No results for input(s): GLUCAP in the last 72 hours.  Assessment/Plan: S/P Procedure(s) (LRB): VIDEO ASSISTED THORACOSCOPY (VATS)/ LEFT UPPER LOBECTOMY with lymph node sampling and onQ placement (Left) -s/p lobectomy. Path T1N0- stage IA Atrial fibrillation- currently in SR on amiodarone. He had another episode yesterday evening. Will go ahead and start Eliquis.  Dc lovenox Continue ambulation transfer to 2 west    LOS: 5 days    [name] 08/06/2016

## 2016-08-06 NOTE — Progress Notes (Signed)
ANTICOAGULATION CONSULT NOTE - Initial Consult  Pharmacy Consult for Eliquis Indication: atrial fibrillation  Allergies  Allergen Reactions  . No Known Allergies     Patient Measurements: Height: '5\' 10"'$  (177.8 cm) Weight: 175 lb (79.4 kg) IBW/kg (Calculated) : 73  Vital Signs: Temp: 98.1 F (36.7 C) (11/07 0743) Temp Source: Oral (11/07 0743) BP: 135/75 (11/07 0743) Pulse Rate: 80 (11/07 0743)  Labs:  Recent Labs  08/06/16 0352  HGB 11.3*  HCT 36.0*  PLT 197  CREATININE 0.83    Estimated Creatinine Clearance: 90.4 mL/min (by C-G formula based on SCr of 0.83 mg/dL).   Medical History: Past Medical History:  Diagnosis Date  . Arthritis    back & legs ( knees)  . Complication of anesthesia    agitated upon waking from anesth.   . Dyspnea    denies SOB when walking , if humidity is high he will have difficulty breathing   . Emphysema   . Family history of adverse reaction to anesthesia    brother is agitated upon waking from anesth.   . Hypertension     Medications:  Prescriptions Prior to Admission  Medication Sig Dispense Refill Last Dose  . acetaminophen (TYLENOL) 500 MG tablet Take 1,000 mg by mouth every 6 (six) hours as needed (for arthritis pain.).    Past Week at Unknown time  . albuterol (PROVENTIL HFA;VENTOLIN HFA) 108 (90 Base) MCG/ACT inhaler Inhale 2 puffs into the lungs every 6 (six) hours as needed for wheezing or shortness of breath. 1 Inhaler 11 08/01/2016 at 0300  . albuterol (PROVENTIL) (2.5 MG/3ML) 0.083% nebulizer solution USE 1 VIAL PER NEBULIZATION FOUR TIMES DAILY AS NEEDED (Patient taking differently: USE 1 VIAL PER NEBULIZATION FOUR TIMES DAILY AS NEEDED FOR SHORTNESS OF BREATH) 75 mL 5 08/01/2016 at 0300  . fexofenadine (ALLEGRA) 180 MG tablet Take 1 tablet (180 mg total) by mouth daily. (Patient taking differently: Take 180 mg by mouth daily as needed for allergies. ) 30 tablet 11 07/31/2016 at Unknown time  . lisinopril  (PRINIVIL,ZESTRIL) 20 MG tablet TAKE 1 TABLET (20 MG TOTAL) BY MOUTH DAILY. 30 tablet 4 08/01/2016 at 0300    Assessment: VATS 11/2, lobectomy, Afib.  Anticoag: Back in afib. Start Eliquis. Hgb 11.3 post-op. CHADS2VASC=2. Renal function WNL  Goal of Therapy:  Therapeutic oral anticoagulation Monitor platelets by anticoagulation protocol: Yes   Plan:  D/c Lovenox Eliquis '5mg'$  BID   Randall Sullivan S. Alford Highland, PharmD, BCPS Clinical Staff Pharmacist Pager [phone]  Randall Sullivan 08/06/2016,8:20 AM

## 2016-08-06 NOTE — Progress Notes (Signed)
08/06/2016 1540 Received pt to room 2W10 from 3S.  Pt is A&o, no c/o voiced.  Tele monitor applied and CCMD notified.  Oriented to room, call light nad bed. Call bell in reach.  Family at bedside. [place]

## 2016-08-06 NOTE — Progress Notes (Signed)
  Amiodarone Drug - Drug Interaction Consult Note  Recommendations: None at this time.  Amiodarone is metabolized by the cytochrome P450 system and therefore has the potential to cause many drug interactions. Amiodarone has an average plasma half-life of 50 days (range 20 to 100 days).   There is potential for drug interactions to occur several weeks or months after stopping treatment and the onset of drug interactions may be slow after initiating amiodarone.   '[]'$  Statins: Increased risk of myopathy. Simvastatin- restrict dose to '20mg'$  daily. Other statins: counsel patients to report any muscle pain or weakness immediately.  '[]'$  Anticoagulants: Amiodarone can increase anticoagulant effect. Consider warfarin dose reduction. Patients should be monitored closely and the dose of anticoagulant altered accordingly, remembering that amiodarone levels take several weeks to stabilize.  '[]'$  Antiepileptics: Amiodarone can increase plasma concentration of phenytoin, the dose should be reduced. Note that small changes in phenytoin dose can result in large changes in levels. Monitor patient and counsel on signs of toxicity.  '[]'$  Beta blockers: increased risk of bradycardia, AV block and myocardial depression. Sotalol - avoid concomitant use.  '[]'$   Calcium channel blockers (diltiazem and verapamil): increased risk of bradycardia, AV block and myocardial depression.  '[]'$   Cyclosporine: Amiodarone increases levels of cyclosporine. Reduced dose of cyclosporine is recommended.  '[]'$  Digoxin dose should be halved when amiodarone is started.  '[]'$  Diuretics: increased risk of cardiotoxicity if hypokalemia occurs.  '[]'$  Oral hypoglycemic agents (glyburide, glipizide, glimepiride): increased risk of hypoglycemia. Patient's glucose levels should be monitored closely when initiating amiodarone therapy.   '[]'$  Drugs that prolong the QT interval:  Torsades de pointes risk may be increased with concurrent use - avoid if possible.   Monitor QTc, also keep magnesium/potassium WNL if concurrent therapy can't be avoided. [name] Antibiotics: e.g. fluoroquinolones, erythromycin. . Antiarrhythmics: e.g. quinidine, procainamide, disopyramide, sotalol. . Antipsychotics: e.g. phenothiazines, haloperidol.  . Lithium, tricyclic antidepressants, and methadone.  Thank You,  Wayland Salinas  08/06/2016 8:24 AM

## 2016-08-06 NOTE — Progress Notes (Signed)
Pt transferred to 2w10 per MD order, report called to receiving nurse and all questions answered. Family aware of transfer

## 2016-08-06 NOTE — Progress Notes (Signed)
Ambulated pt around unit and he went back into afib 80's 100's VSS pt already on 400 mg amioadarone BID and Lovenox rate controlled at this time will continue to monitor

## 2016-08-06 NOTE — Plan of Care (Signed)
Problem: Education: Goal: Knowledge of the prescribed therapeutic regimen will improve Outcome: Progressing PRN Oxycodone is effective in his pain control.  Problem: Physical Regulation: Goal: Postoperative complications will be avoided or minimized Outcome: Progressing Pt ambulates in the hallway.  Problem: Respiratory: Goal: Pain level will decrease with appropriate interventions Outcome: Progressing PRN pain meds given as ordered. Goal: Respiratory status will improve Outcome: Progressing Pt now on room air. No signs of respiratory distress noted. Instructed to call RN if SOB noted.

## 2016-08-07 ENCOUNTER — Encounter (HOSPITAL_COMMUNITY): Payer: Self-pay | Admitting: Cardiology

## 2016-08-07 ENCOUNTER — Inpatient Hospital Stay (HOSPITAL_COMMUNITY): Payer: Medicare Other

## 2016-08-07 DIAGNOSIS — I480 Paroxysmal atrial fibrillation: Secondary | ICD-10-CM

## 2016-08-07 MED ORDER — AMIODARONE HCL 200 MG PO TABS: 400.0000 mg | ORAL_TABLET | Freq: Two times a day (BID) | ORAL | 1 refills | Status: DC

## 2016-08-07 MED ORDER — APIXABAN 5 MG PO TABS: 5.0000 mg | ORAL_TABLET | Freq: Two times a day (BID) | ORAL | Status: DC

## 2016-08-07 MED ORDER — GUAIFENESIN ER 600 MG PO TB12: 1200.0000 mg | ORAL_TABLET | Freq: Two times a day (BID) | ORAL | Status: DC | PRN

## 2016-08-07 MED ORDER — OXYCODONE HCL 5 MG PO TABS: 5.0000 mg | ORAL_TABLET | ORAL | 0 refills | Status: DC | PRN

## 2016-08-07 NOTE — Discharge Instructions (Signed)
Video Assisted Thoracoscopy, Care After Refer to this sheet in the next few weeks. These instructions provide you with information on caring for yourself after your procedure. Your caregiver may also give you more specific instructions. Your procedure has been planned according to current medical practices, but problems sometimes occur. Call your caregiver if you have any problems or questions after your procedure. HOME CARE INSTRUCTIONS   Only take over-the-counter or prescription medications as directed.  Only take pain medications (narcotics) as directed.  Do not drive until your caregiver approves. Driving while taking narcotics or soon after surgery can be dangerous, so discuss the specific timing with your caregiver.  Avoid activities that use your chest muscles, such as lifting heavy objects, for at least 3-4 weeks.   Take deep breaths to expand the lungs and to protect against pneumonia.  Do breathing exercises as directed by your caregiver. If you were given an incentive spirometer to help with breathing, use it as directed.  You may resume a normal diet and activities when you feel you are able to or as directed.  Do not take a bath until your caregiver says it is OK. Use the shower instead.   Keep the bandage (dressing) covering the area where the chest tube was inserted (incision site) dry for 48 hours. After 48 hours, remove the dressing unless there is new drainage.  Remove dressings as directed by your caregiver.  Change dressings if necessary or as directed.  Keep all follow-up appointments. It is important for you to see your caregiver after surgery to discuss appropriate follow-up care and surveillance, if it is necessary. SEEK MEDICAL CARE:  You feel excessive or increasing pain at an incision site.  You notice bleeding, skin irritation, drainage, swelling, or redness at an incision site.  There is a bad smell coming from an incision or dressing.  It feels like  your heart is fluttering or beating rapidly.  Your pain medication does not relieve your pain. SEEK IMMEDIATE MEDICAL CARE IF:   You have a fever.   You have chest pain.  You have a rash.  You have shortness of breath.  You have trouble breathing.   You feel weak, lightheaded, dizzy, or faint.  MAKE SURE YOU:   Understand these instructions.   Will watch your condition.   Will get help right away if you are not doing well or get worse.   This information is not intended to replace advice given to you by your health care provider. Make sure you discuss any questions you have with your health care provider.   Document Released: 01/11/2013 Document Revised: 10/07/2014 Document Reviewed: 01/11/2013 Elsevier Interactive Patient Education 2016 [employer].   ==========================================================================================================  Information on my medicine - ELIQUIS (apixaban)  This medication education was reviewed with me or my healthcare representative as part of my discharge preparation.  The pharmacist that spoke with me during my hospital stay was:  Arty Baumgartner, [facility]  Why was Eliquis prescribed for you? Eliquis was prescribed for you to reduce the risk of a blood clot forming that can cause a stroke if you have a medical condition called atrial fibrillation (a type of irregular heartbeat).  What do You need to know about Eliquis ? Take your Eliquis TWICE DAILY - one tablet in the morning and one tablet in the evening with or without food. If you have difficulty swallowing the tablet whole please discuss with your pharmacist how to take the medication safely.  Take Eliquis  exactly as prescribed by your doctor and DO NOT stop taking Eliquis without talking to the doctor who prescribed the medication.  Stopping may increase your risk of developing a stroke.  Refill your prescription before you run out.  After  discharge, you should have regular check-up appointments with your healthcare provider that is prescribing your Eliquis.  In the future your dose may need to be changed if your kidney function or weight changes by a significant amount or as you get older.  What do you do if you miss a dose? If you miss a dose, take it as soon as you remember on the same day and resume taking twice daily.  Do not take more than one dose of ELIQUIS at the same time to make up a missed dose.  Important Safety Information A possible side effect of Eliquis is bleeding. You should call your healthcare provider right away if you experience any of the following: ? Bleeding from an injury or your nose that does not stop. ? Unusual colored urine (red or dark brown) or unusual colored stools (red or black). ? Unusual bruising for unknown reasons. ? A serious fall or if you hit your head (even if there is no bleeding).  Some medicines may interact with Eliquis and might increase your risk of bleeding or clotting while on Eliquis. To help avoid this, consult your healthcare provider or pharmacist prior to using any new prescription or non-prescription medications, including herbals, vitamins, non-steroidal anti-inflammatory drugs (NSAIDs) and supplements.  This website has more information on Eliquis (apixaban): http://[email or web address].com/eliquis/home

## 2016-08-07 NOTE — Consult Note (Signed)
CARDIOLOGY CONSULT NOTE   Patient ID: Randall Sullivan MRN: [id number] DOB/AGE: [birth year] 65 y.o.  Admit date: 08/01/2016  Primary Physician   Claretta Fraise, MD Primary Cardiologist   None Reason for Consultation   Atrial Fibrillation Requesting Physician  Dr. Roxan Hockey  HPI: Randall Sullivan is a 65 y.o. 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 Patient underwent left VATS/ left upper lobectomy on 08/02/16. Two chest tubes were placed which were removed on 11/5 and 40/9 without complication. Patient was noted to be in Afib by telemetry on 11/3 after surgery with rate up to 120s. He was started on Amiodarone drip after initial 150 mg bolus with conversion to sinus rhythm. His rhythm has since gone in and out of atrial fibrillation. He has no prior known history of arrhythmia. Patient does report feeling palpitations during these episodes which he has only felt while ambulating. He denies any feeling of palpitations at rest. He has occasional lightheadedness. He denies any chest pain, diaphoresis, nausea, vomiting, focal weakness, falls, or syncope. His amiodarone was converted to oral 400 mg BID. He was started on Eliquis 5 mg BID on 08/06/16. He has been oxygenating well on room air. No prior CHF history, no Echo documented.  Patient is a former smoker of 1 PPD for 45 years, quit 2-3 weeks ago after diagnosis of lung cancer. He denies alcohol use. Former marijuana use in the 1970s. He denies cocaine or IV drug use. He reports drinking "a lot" of coffee when he was smoking. He drinks about one 20 oz soda daily. He has no history of thyroid disease.   He used to work in [place] s[employer]/ housing, retired in April 2017. He lives in [place]. He is accompanied by his wife.   Past Medical History:  Diagnosis Date  . Arthritis    back & legs ( knees)  . Complication  of anesthesia    agitated upon waking from anesth.   . Emphysema   . Hypertension   . Tobacco abuse      Past Surgical History:  Procedure Laterality Date  . HERNIA REPAIR Bilateral 8119   umbilical  . VIDEO ASSISTED THORACOSCOPY (VATS)/ LOBECTOMY Left 08/01/2016   Procedure: VIDEO ASSISTED THORACOSCOPY (VATS)/ LEFT UPPER LOBECTOMY with lymph node sampling and onQ placement;  Surgeon: [name], MD;  Location: [facility];  Service: Thoracic;  Laterality: Left;  [name] VIDEO BRONCHOSCOPY WITH ENDOBRONCHIAL NAVIGATION N/A 07/03/2016   Procedure: VIDEO BRONCHOSCOPY WITH ENDOBRONCHIAL NAVIGATION;  Surgeon: [name], MD;  Location: [facility];  Service: Thoracic;  Laterality: N/A;    Allergies  Allergen Reactions  . No Known Allergies     I have reviewed the patient's current medications . amiodarone  400 mg Oral BID  . apixaban  5 mg Oral BID  . bisacodyl  10 mg Oral Daily  . guaiFENesin  1,200 mg Oral BID  . levalbuterol  0.63 mg Nebulization TID  . lisinopril  10 mg Oral Daily  . mouth rinse  15 mL Mouth Rinse BID  . pantoprazole  40 mg Oral Daily  . senna-docusate  1 tablet Oral QHS    levalbuterol, loratadine, ondansetron (ZOFRAN) IV, oxyCODONE, pneumococcal 23 valent vaccine, potassium chloride, traMADol  Prior to Admission medications   Medication Sig Start Date End Date Taking? Authorizing Provider  acetaminophen (TYLENOL) 500 MG tablet Take 1,000 mg by mouth  every 6 (six) hours as needed (for arthritis pain.).    Yes Historical Provider, MD  albuterol (PROVENTIL HFA;VENTOLIN HFA) 108 (90 Base) MCG/ACT inhaler Inhale 2 puffs into the lungs every 6 (six) hours as needed for wheezing or shortness of breath. 07/24/16  Yes Claretta Fraise, MD  albuterol (PROVENTIL) (2.5 MG/3ML) 0.083% nebulizer solution USE 1 VIAL PER NEBULIZATION FOUR TIMES DAILY AS NEEDED Patient taking differently: USE 1 VIAL PER NEBULIZATION FOUR TIMES DAILY AS NEEDED FOR SHORTNESS OF BREATH 06/21/16   Yes Claretta Fraise, MD  fexofenadine (ALLEGRA) 180 MG tablet Take 1 tablet (180 mg total) by mouth daily. Patient taking differently: Take 180 mg by mouth daily as needed for allergies.  08/01/15  Yes Claretta Fraise, MD  lisinopril (PRINIVIL,ZESTRIL) 20 MG tablet TAKE 1 TABLET (20 MG TOTAL) BY MOUTH DAILY. 07/18/16  Yes Claretta Fraise, MD  amiodarone (PACERONE) 200 MG tablet Take 2 tablets (400 mg total) by mouth 2 (two) times daily. For 2 days;then take Amiodarone 200 mg by mouth two times daily for 2 weeks;then take Amiodarone 200 mg by mouth Daily thereafter. 08/07/16   Donielle Liston Alba, PA-C  apixaban (ELIQUIS) 5 MG TABS tablet Take 1 tablet (5 mg total) by mouth 2 (two) times daily. 08/07/16   Donielle Liston Alba, PA-C  guaiFENesin (MUCINEX) 600 MG 12 hr tablet Take 2 tablets (1,200 mg total) by mouth 2 (two) times daily as needed. For cough 08/07/16   Donielle Liston Alba, PA-C  oxyCODONE (OXY IR/ROXICODONE) 5 MG immediate release tablet Take 1-2 tablets (5-10 mg total) by mouth every 4 (four) hours as needed for severe pain. 08/07/16   Donielle Liston Alba, PA-C     Social History   Social History  . Marital status: Married    Spouse name: N/A  . Number of children: N/A  . Years of education: N/A   Occupational History  . [employer] siding    Social History Main Topics  . Smoking status: Former Smoker    Packs/day: 1.00    Years: 40.00    Types: Cigarettes    Quit date: 05/28/2016  . Smokeless tobacco: Never Used  . Alcohol use No     Comment: quit- 2016  . Drug use: No  . Sexual activity: Not on file   Other Topics Concern  . Not on file   Social History Narrative  . No narrative on file    Family Status  Relation Status  . Mother Deceased  . Father Deceased  . Sister Deceased  . Sister Alive  . Sister Deceased  . Paternal Aunt   . Brother    Family History  Problem Relation Age of Onset  . Cancer Mother   . Cancer Sister   . Aneurysm Sister   . Kidney disease  Paternal Aunt   . Asthma Brother   . Cancer - Lung Brother      Family Hx: MI in brother, Lung Cancer in brother, Ovarian Cancer in sister, Unknown cancer in mother  ROS:  Full 14 point review of systems complete and found to be negative unless listed above.  Physical Exam: Blood pressure 119/68, pulse 75, temperature 98.4 F (36.9 C), temperature source Oral, resp. rate 18, height '5\' 10"'$  (1.778 m), weight 175 lb (79.4 kg), SpO2 95 %.  General: Well developed, well nourished, male in no acute distress Head: EOMI. Normocephalic and atraumatic Lungs: Resp regular and unlabored, coarse breath sounds on left, no dullness to percussion. Heart: Distant heart sounds, RRR  no s3, s4, or murmurs..   Neck: No carotid bruits. No lymphadenopathy.  No JVD. Abdomen: Abdomen soft and non-tender without masses or hernias noted. Msk:  No weakness, no joint deformities or effusions. Extremities: No clubbing, cyanosis or edema. DP/PT/Radials 2+ and equal bilaterally. Neuro: Alert and oriented X 3. No focal deficits noted. Psych:  Good affect, responds appropriately Skin: Well healed surgical scars left lateral chest wall  Labs:   Lab Results  Component Value Date   WBC 9.8 08/06/2016   HGB 11.3 (L) 08/06/2016   HCT 36.0 (L) 08/06/2016   MCV 85.3 08/06/2016   PLT 197 08/06/2016   No results for input(s): INR in the last 72 hours.   Recent Labs Lab 08/03/16 0435 08/06/16 0352  NA 133* 139  K 4.3 4.2  CL 100* 97*  CO2 27 32  BUN 12 6  CREATININE 0.84 0.83  CALCIUM 8.0* 8.7*  PROT 5.4*  --   BILITOT 0.7  --   ALKPHOS 36*  --   ALT 11*  --   AST 18  --   GLUCOSE 103* 113*  ALBUMIN 2.7*  --    Magnesium  Date Value Ref Range Status  08/01/2016 1.8 1.7 - 2.4 mg/dL Final   No results for input(s): CKTOTAL, CKMB, TROPONINI in the last 72 hours. No results for input(s): TROPIPOC in the last 72 hours. No results found for: PROBNP No results found for: CHOL, HDL, LDLCALC, TRIG No  results found for: DDIMER Lipase  Date/Time Value Ref Range Status  10/04/2011 10:16 AM 39 11 - 59 U/L Final   No results found for: TSH, T4TOTAL, T3FREE, THYROIDAB No results found for: VITAMINB12, FOLATE, FERRITIN, TIBC, IRON, RETICCTPCT  Echo: n/a  ECG:   08/02/16: Atrial fibrillation, rate 108, RAD. Afib new from prior. 08/05/16: Sinus rhythm, RAD, non-specific ST changes V2-V6   Radiology:  Dg Chest 2 View  Result Date: 08/07/2016 CLINICAL DATA:  Status post left upper lobectomy for malignancy. Persistent shortness of breath. EXAM: CHEST  2 VIEW COMPARISON:  PA and lateral chest x-ray of August 06, 2016. FINDINGS: The right lung is well-expanded. There is shift of the mediastinum toward the left which is stable. There is a stable air-fluid level in the upper aspect of the left hemi thorax. The interstitial markings within the aerated portion of the left lung are mildly prominent though stable. A trace of pleural fluid may be present at the left lung base. The heart is normal in size. The pulmonary vascularity is not engorged. The right internal jugular venous catheter tip projects over the midportion of the SVC. The observed bony thorax exhibits no acute abnormality. IMPRESSION: Stable appearance of the loculated air and fluid collection in the upper left hemithorax. Stable hyperinflation of the right lung. Persistent subcutaneous emphysema in the left axillary region with decreased volume overall as compared to yesterday's study. Electronically Signed   By: David  Martinique M.D.   On: 08/07/2016 08:56   Dg Chest 2 View  Result Date: 08/06/2016 CLINICAL DATA:  Chest soreness.  Prior surgery. EXAM: CHEST  2 VIEW COMPARISON:  08/05/2016.  08/04/2016.  08/03/2016. FINDINGS: Right IJ line in stable position . Postsurgical changes left chest again noted. A prominent air-fluid level noted over the left upper lung. This could represent a a cavitary lung process or a hydro pneumothorax. Small left  pleural effusion. Right lung clear. Heart size stable. Diffuse subcutaneous emphysema left chest. Mild adynamic ileus cannot be excluded. IMPRESSION: 1. Right  IJ line in stable position. 2. Postsurgical changes left chest again noted. A new prominent air-fluid level noted over the left upper lung. This could represent a cavitary lung process or hydro pneumothorax. Small left pleural effusion. Diffuse left chest wall subcutaneous emphysema again noted. 3. Mild adynamic ileus cannot be excluded . Critical Value/emergent results were called by telephone at the time of interpretation on 08/06/2016 at 9:28 am to Nurse Loree Fee, who verbally acknowledged these results. 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 Found to be in and out of Afib after surgery.  Paroxysmal Atrial Fibrillation: New onset s/p surgery on 08/02/16, occurs with ambulation. Very likely secondary to stress from surgery. Initially on IV Amiodarone, now on oral Amiodarone 400 mg BID. CHADS2VASc score is 2, 2.2 % yearly stroke risk. He was started on Eliquis on 08/06/16. Currently in sinus rhythm. -Continue Eliquis 5 mg BID -Continue Amiodarone 400 mg BID for now; may be able to come off if in future if stays in sinus rhythm on follow up  -Continue telemetry  HTN: Stable this afternoon -On Lisinopril 10 mg daily  Adenocarcinoma Left Lung: s/p left VATS/ left upper lobectomy on 08/02/16 -Management per CVTS   Signed: Zada Finders, MD  [facility] PGY-2 08/07/2016, 5:21 PM  History and all data above reviewed.  Patient examined.  I agree with the findings as above. The patient has paroxysmal atrial fib post surgery.  He is now on  amiodarone and Eliquis.  He does feel this but it is not particularly bothering him.  He has had significant pain associated with this incision.   The patient exam reveals COR:RRR  ,  Lungs: Clear  ,  Abd: Positive bowel sounds, no rebound no guarding, Ext No edema  .  All available labs, radiology testing, previous records reviewed. Agree with documented assessment and plan. Atrial fib:  Agree with amiodarone and Eliquis.  I don't suspect that either of these will be permanent.  I will follow in particular as an out patient to time the discontinuation of amiodarone as I would not want to this be long term.    Minus Breeding  5:33 PM  08/07/2016

## 2016-08-07 NOTE — Progress Notes (Addendum)
      [address]       [place] [address]             [phone]       6 Days Post-Op Procedure(s) (LRB): VIDEO ASSISTED THORACOSCOPY (VATS)/ LEFT UPPER LOBECTOMY with lymph node sampling and onQ placement (Left)  Subjective: Patient has incisional pain.  Objective: Vital signs in last 24 hours: Temp:  [97.9 F (36.6 C)-98.3 F (36.8 C)] 97.9 F (36.6 C) (11/08 0428) Pulse Rate:  [72-77] 77 (11/08 0428) Cardiac Rhythm: Normal sinus rhythm (11/07 1900) Resp:  [16-18] 18 (11/08 0428) BP: (108-151)/(56-80) 151/79 (11/08 0428) SpO2:  [94 %-98 %] 98 % (11/08 0428)      Intake/Output from previous day: 11/07 0701 - 11/08 0700 In: 17 [P.O.:690] Out: 50 [Urine:50]   Physical Exam:  Cardiovascular: RRR Pulmonary: Coarse breath sounds on left Abdomen: Soft, non tender, bowel sounds present. Wounds: Clean and dry.  No erythema or signs of infection.   Lab Results: CBC: Recent Labs  08/06/16 0352  WBC 9.8  HGB 11.3*  HCT 36.0*  PLT 197   BMET:  Recent Labs  08/06/16 0352  NA 139  K 4.2  CL 97*  CO2 32  GLUCOSE 113*  BUN 6  CREATININE 0.83  CALCIUM 8.7*    PT/INR: No results for input(s): LABPROT, INR in the last 72 hours. ABG:  INR: Will add last result for INR, ABG once components are confirmed Will add last 4 CBG results once components are confirmed  Assessment/Plan:  1. CV - Previously, transient a fib. SR in the 70-80's this am. On Amiodarone 400 mg bid, Lisinopril 10 mg daily, and Eliquis 5 mg bid. Monitor BP as may need to increase Lisinopril to pre dose of 20 mg daily. 2.  Pulmonary - On 1 liter of oxygen via [place]. Wean to room air as tolerates. CXR this am appears stable. 3. Possible discharge later today.  ZIMMERMAN,DONIELLE MPA-C 08/07/2016,8:22 AM Patient seen and examined, agree with above Will taper down amiodarone dose as usual Home later today if rhythm stable and off O2  Steven C. Roxan Hockey, MD [facility] [phone]

## 2016-08-07 NOTE — Progress Notes (Signed)
SATURATION QUALIFICATIONS: (This note is used to comply with regulatory documentation for home oxygen)  Patient Saturations on Room Air at Rest = 93-96%  Patient Saturations on Room Air while Ambulating = 90-96%  Patient Saturations on 0 Liters of oxygen while Ambulating = 96%  Please briefly explain why patient needs home oxygen:Pt does not require oxygen

## 2016-08-07 NOTE — Care Management Note (Signed)
Case Management Note Previous CM note initiated by Zenon Mayo, RN-08/05/2016, 7:13 PM    Patient Details  Name: Randall Sullivan MRN: [id number] Date of Birth: [birth year]  Subjective/Objective:   Pod 4 vats, lobectomy, afib, chest tube dc'd,pca dc'd and amiodorone drip dc'd.  For cxr tomorrow, NCM will cont to follow for dc needs.   11/7 chest tubes out, patient is in/out of afib when ambulate, he does not have medicare part D , no medicaiton coverage, will be on Eliquis, NCM will give him Eliquis 30 day savings card, patient ast application and Match letter to help ast with medications at discharge.  NCM will cont to follow for dc needs.                 Action/Plan:   Expected Discharge Date:   08/07/16               Expected Discharge Plan:  Home/Self Care  In-House Referral:     Discharge planning Services  CM Consult, [place] Program, Medication Assistance  Post Acute Care Choice:    Choice offered to:     DME Arranged:    DME Agency:     HH Arranged:    HH Agency:     Status of Service:  Completed, signed off  If discussed at [employer] of [employer], dates discussed:    Additional Comments: 08/07/16- 1200- Marvetta Gibbons RN CM- follow up done with pt at bedside- confirmed that pt has [place] letter to use on discharge along with updated pharmacy list of participating pharmacies. Went over [employer] program and $3 copay with pt.  Pt also provided the pt assistance application for Eliquis which has been filled out by provider- along with script. Pt has 30 day free card to use on discharge for Eliquis.   Dahlia Client [place], RN 08/07/2016, 1:38 PM [phone]

## 2016-08-08 LAB — TSH: TSH: 1.52 u[IU]/mL (ref 0.350–4.500)

## 2016-08-08 MED ORDER — APIXABAN 5 MG PO TABS: 5.0000 mg | ORAL_TABLET | Freq: Two times a day (BID) | ORAL | 0 refills | Status: DC

## 2016-08-08 MED ORDER — SODIUM CHLORIDE 0.9% FLUSH: 10.0000 mL | INTRAVENOUS | Status: DC | PRN

## 2016-08-08 NOTE — Progress Notes (Addendum)
      [address]       [place] [address]             [phone]       7 Days Post-Op Procedure(s) (LRB): VIDEO ASSISTED THORACOSCOPY (VATS)/ LEFT UPPER LOBECTOMY with lymph node sampling and onQ placement (Left)  Subjective: Patient without specific complaints this am.  Objective: Vital signs in last 24 hours: Temp:  [97.7 F (36.5 C)-98.4 F (36.9 C)] 97.7 F (36.5 C) (11/09 0521) Pulse Rate:  [66-82] 66 (11/09 0521) Cardiac Rhythm: Normal sinus rhythm (11/08 1903) Resp:  [18] 18 (11/09 0521) BP: (118-144)/(59-75) 128/72 (11/09 0521) SpO2:  [93 %-95 %] 95 % (11/09 0521)    Intake/Output from previous day: 11/08 0701 - 11/09 0700 In: 720 [P.O.:720] Out: -    Physical Exam:  Cardiovascular: [facility] Pulmonary: Clear on the right;somewhat coarse breath sounds on left Abdomen: Soft, non tender, bowel sounds present. Wounds: Clean and dry.  No erythema or signs of infection.   Lab Results: CBC:  Recent Labs  08/06/16 0352  WBC 9.8  HGB 11.3*  HCT 36.0*  PLT 197   BMET:   Recent Labs  08/06/16 0352  NA 139  K 4.2  CL 97*  CO2 32  GLUCOSE 113*  BUN 6  CREATININE 0.83  CALCIUM 8.7*    PT/INR: No results for input(s): LABPROT, INR in the last 72 hours. ABG:  INR: Will add last result for INR, ABG once components are confirmed Will add last 4 CBG results once components are confirmed  Assessment/Plan:  1. CV - PAF. On Amiodarone 400 mg bid, Lisinopril 10 mg daily, and Eliquis 5 mg bid. Cardiology saw late yesterday afternoon. No changes to medication at this time.  2.  Pulmonary - On room. Encourage incentive spirometer 3. Likely discharge later today.  ZIMMERMAN,DONIELLE MPA-C 08/08/2016,7:35 AM Patient seen and examined, agree with above Dc central line Dc home   Eder Macek C. Roxan Hockey, MD [facility] [phone]

## 2016-08-08 NOTE — Progress Notes (Signed)
Discussed with the patient and all questioned fully answered. He will call me if any problems arise.  Pt given paper prescription for amiodarone, oxycodone, and 2 paper eliquis prescriptions (one with refills, one without).  IJ removed prior to DC. Tip of line intact. Site clean, dry, intact.  Education done with pt and pt wife. All questions answered.   Fritz Pickerel, RN

## 2016-08-08 NOTE — Care Management Important Message (Signed)
Important Message  Patient Details  Name: Randall Sullivan MRN: [id number] Date of Birth: Aug 18, 1951   Medicare Important Message Given:  Yes    Hayde Kilgour [place] 08/08/2016, 9:30 AM

## 2016-08-08 NOTE — Progress Notes (Signed)
Patient Name: Randall Sullivan Date of Encounter: 08/08/2016  Primary Cardiologist: Dr. [facility] Problem List     Active Problems:   Cancer of left lung [facility])   S/P lobectomy of lung     Subjective   Feels well this morning. Denies any palpitations overnight. Walked down the hall without issue. Denies chest pain.   Inpatient Medications    Scheduled Meds: . amiodarone  400 mg Oral BID  . apixaban  5 mg Oral BID  . bisacodyl  10 mg Oral Daily  . guaiFENesin  1,200 mg Oral BID  . levalbuterol  0.63 mg Nebulization TID  . lisinopril  10 mg Oral Daily  . mouth rinse  15 mL Mouth Rinse BID  . pantoprazole  40 mg Oral Daily  . senna-docusate  1 tablet Oral QHS   Continuous Infusions:  PRN Meds: levalbuterol, loratadine, ondansetron (ZOFRAN) IV, oxyCODONE, pneumococcal 23 valent vaccine, potassium chloride, sodium chloride flush, traMADol   Vital Signs    Vitals:   08/07/16 1934 08/07/16 1936 08/08/16 0521 08/08/16 1004  BP: (!) 118/59  128/72 117/74  Pulse: 76  66   Resp: 18  18   Temp: 98.2 F (36.8 C)  97.7 F (36.5 C)   TempSrc: Oral  Oral   SpO2: 94% 95% 95%   Weight:      Height:        Intake/Output Summary (Last 24 hours) at 08/08/16 1049 Last data filed at 08/08/16 0903  Gross per 24 hour  Intake              720 ml  Output                0 ml  Net              720 ml   Filed Weights   08/01/16 0616  Weight: 175 lb (79.4 kg)    Physical Exam    GEN: Well nourished, well developed, in no acute distress.  HEENT: Grossly normal.  Neck: Supple, no JVD Cardiac: RRR, no murmurs, rubs, or gallops. No clubbing, cyanosis, edema.  Radials/DP/PT 2+ and equal bilaterally.  Respiratory:  Respirations regular and unlabored, distant breath sounds clear to auscultation bilaterally. GI: Soft, nontender, nondistended, BS + x 4. Skin: warma Psych: AAOx3.  Normal affect.  Labs    CBC  Recent Labs  08/06/16 0352  WBC 9.8  HGB 11.3*  HCT 36.0*    MCV 85.3  PLT 297   Basic Metabolic Panel  Recent Labs  08/06/16 0352  NA 139  K 4.2  CL 97*  CO2 32  GLUCOSE 113*  BUN 6  CREATININE 0.83  CALCIUM 8.7*   Liver Function Tests No results for input(s): AST, ALT, ALKPHOS, BILITOT, PROT, ALBUMIN in the last 72 hours. No results for input(s): LIPASE, AMYLASE in the last 72 hours. Cardiac Enzymes No results for input(s): CKTOTAL, CKMB, CKMBINDEX, TROPONINI in the last 72 hours. BNP Invalid input(s): POCBNP D-Dimer No results for input(s): DDIMER in the last 72 hours. Hemoglobin A1C No results for input(s): HGBA1C in the last 72 hours. Fasting Lipid Panel No results for input(s): CHOL, HDL, LDLCALC, TRIG, CHOLHDL, LDLDIRECT in the last 72 hours. Thyroid Function Tests  Recent Labs  08/08/16 0500  TSH 1.520    Telemetry    In and out of A-fib overnight with rate to 120s; sinus rhythm now - Personally Reviewed  ECG    08/02/16: Atrial fibrillation, rate 108,  RAD. Afib new from prior. 08/05/16: Sinus rhythm, RAD, non-specific ST changes V2-V6 - Personally Reviewed  Radiology    Dg Chest 2 View  Result Date: 08/07/2016 CLINICAL DATA:  Status post left upper lobectomy for malignancy. Persistent shortness of breath. EXAM: CHEST  2 VIEW COMPARISON:  PA and lateral chest x-ray of August 06, 2016. FINDINGS: The right lung is well-expanded. There is shift of the mediastinum toward the left which is stable. There is a stable air-fluid level in the upper aspect of the left hemi thorax. The interstitial markings within the aerated portion of the left lung are mildly prominent though stable. A trace of pleural fluid may be present at the left lung base. The heart is normal in size. The pulmonary vascularity is not engorged. The right internal jugular venous catheter tip projects over the midportion of the SVC. The observed bony thorax exhibits no acute abnormality. IMPRESSION: Stable appearance of the loculated air and fluid  collection in the upper left hemithorax. Stable hyperinflation of the right lung. Persistent subcutaneous emphysema in the left axillary region with decreased volume overall as compared to yesterday's study. Electronically Signed   By: Randall  Sullivan M.D.   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 Found to be in and out of Afib after surgery.  Assessment & Plan    Paroxysmal Atrial Fibrillation: New onset s/p surgery on 08/02/16, occurs with ambulation. Very likely secondary to stress from surgery. Initially on IV Amiodarone, now on oral Amiodarone 400 mg BID. CHADS2VASc score is 2, 2.2 % yearly stroke risk. He was started on Eliquis on 08/06/16. Currently in sinus rhythm, did go in and out of Afib overnight by telemetry. Patient denies any palpitations overnight. -Continue Eliquis 5 mg BID -Continue Amiodarone 400 mg BID for now; may be able to come off if in future if stays in sinus rhythm on follow up  -f/u with Dr. Percival Sullivan outpatient  HTN: Stable -On Lisinopril 10 mg daily  Adenocarcinoma Left Lung: s/p left VATS/ left upper lobectomy on 08/02/16 -Management per CVTS  Signed, Randall Finders, MD  08/08/2016, 10:49 AM   History and all data above reviewed.  Patient examined.  I agree with the findings as above. He had atrial fib but did not feel this today.  The patient exam reveals COR:RRR  ,  Lungs: Clear  ,  Abd: Positive bowel sounds, no rebound no guarding, Ext No edema  .  All available labs, radiology testing, previous records reviewed. Agree with documented assessment and plan. OK to discharge on a taper of amiodarone.  We will see him in about two weeks.  Continue Eliquis.    Randall Sullivan  11:48 AM  08/08/2016

## 2016-08-15 LAB — ACID FAST CULTURE WITH REFLEXED SENSITIVITIES (MYCOBACTERIA)

## 2016-08-15 LAB — ACID FAST CULTURE WITH REFLEXED SENSITIVITIES: ACID FAST CULTURE - AFSCU3: NEGATIVE

## 2016-08-19 ENCOUNTER — Other Ambulatory Visit: Payer: Self-pay | Admitting: Thoracic Surgery (Cardiothoracic Vascular Surgery)

## 2016-08-19 DIAGNOSIS — Z902 Acquired absence of lung [part of]: Secondary | ICD-10-CM

## 2016-08-20 ENCOUNTER — Ambulatory Visit (INDEPENDENT_AMBULATORY_CARE_PROVIDER_SITE_OTHER): Payer: Self-pay | Admitting: Thoracic Surgery (Cardiothoracic Vascular Surgery)

## 2016-08-20 ENCOUNTER — Ambulatory Visit
Admission: RE | Admit: 2016-08-20 | Discharge: 2016-08-20 | Disposition: A | Discharge status: Home / Self Care | Payer: Medicare Other | Source: Ambulatory Visit | Attending: Thoracic Surgery (Cardiothoracic Vascular Surgery) | Admitting: Thoracic Surgery (Cardiothoracic Vascular Surgery)

## 2016-08-20 ENCOUNTER — Encounter: Payer: Self-pay | Admitting: Thoracic Surgery (Cardiothoracic Vascular Surgery)

## 2016-08-20 VITALS — BP 149/81 | HR 64 | Resp 18 | Ht 70.0 in | Wt 175.0 lb

## 2016-08-20 DIAGNOSIS — C3412 Malignant neoplasm of upper lobe, left bronchus or lung: Secondary | ICD-10-CM

## 2016-08-20 DIAGNOSIS — Z902 Acquired absence of lung [part of]: Secondary | ICD-10-CM

## 2016-08-20 DIAGNOSIS — R918 Other nonspecific abnormal finding of lung field: Secondary | ICD-10-CM

## 2016-08-20 DIAGNOSIS — J90 Pleural effusion, not elsewhere classified: Secondary | ICD-10-CM | POA: Diagnosis not present

## 2016-08-20 MED ORDER — OXYCODONE HCL 5 MG PO TABS: 5.0000 mg | ORAL_TABLET | ORAL | 0 refills | Status: DC | PRN

## 2016-08-20 NOTE — Progress Notes (Signed)
[address]       [place] [address]             [phone]       HPI: [name] returns today for a scheduled postoperative follow-up visit.  He is a 65 year old man with a history of heavy tobacco abuse and bullous emphysema. He recently found to have a left upper lobe nodule. On navigational bronchoscopy that was a well-differentiated adenocarcinoma. He underwent a thoracoscopic left upper lobectomy and lymph node dissection on 08/01/2016. Final pathology was T1b, N0, stage IA.  His postoperative course was Complicated by atrial fibrillation. He started on amiodarone and Eliquis. He was seen in consultation by Dr. Percival Spanish. He has an appointment coming up with him.  He is still having some incisional pain. He is taking oxycodone couple times a day for that. He is not having any major respiratory issues. He has not smoked since prior to his biopsy.  Past Medical History:  Diagnosis Date  . Arthritis    back & legs ( knees)  . Complication of anesthesia    agitated upon waking from anesth.   . Emphysema   . Hypertension   . Tobacco abuse     Current Outpatient Prescriptions  Medication Sig Dispense Refill  . acetaminophen (TYLENOL) 500 MG tablet Take 1,000 mg by mouth every 6 (six) hours as needed (for arthritis pain.).     [name] albuterol (PROVENTIL HFA;VENTOLIN HFA) 108 (90 Base) MCG/ACT inhaler Inhale 2 puffs into the lungs every 6 (six) hours as needed for wheezing or shortness of breath. 1 Inhaler 11  . albuterol (PROVENTIL) (2.5 MG/3ML) 0.083% nebulizer solution USE 1 VIAL PER NEBULIZATION FOUR TIMES DAILY AS NEEDED (Patient taking differently: USE 1 VIAL PER NEBULIZATION FOUR TIMES DAILY AS NEEDED FOR SHORTNESS OF BREATH) 75 mL 5  . amiodarone (PACERONE) 200 MG tablet Take 2 tablets (400 mg total) by mouth 2 (two) times daily. For 2 days;then take Amiodarone 200 mg by mouth two times daily for 2 weeks;then take Amiodarone 200 mg by mouth Daily  thereafter. 60 tablet 1  . apixaban (ELIQUIS) 5 MG TABS tablet Take 1 tablet (5 mg total) by mouth 2 (two) times daily. 60 tablet 0  . fexofenadine (ALLEGRA) 180 MG tablet Take 1 tablet (180 mg total) by mouth daily. (Patient taking differently: Take 180 mg by mouth daily as needed for allergies. ) 30 tablet 11  . guaiFENesin (MUCINEX) 600 MG 12 hr tablet Take 2 tablets (1,200 mg total) by mouth 2 (two) times daily as needed. For cough    . lisinopril (PRINIVIL,ZESTRIL) 20 MG tablet TAKE 1 TABLET (20 MG TOTAL) BY MOUTH DAILY. 30 tablet 4  . oxyCODONE (OXY IR/ROXICODONE) 5 MG immediate release tablet Take 1-2 tablets (5-10 mg total) by mouth every 4 (four) hours as needed for severe pain. 30 tablet 0   No current facility-administered medications for this visit.     Physical Exam: BP (!) 149/81 (BP Location: Right Arm, Patient Position: Sitting, Cuff Size: Large)   Pulse 64   Resp 18   Ht '5\' 10"'$  (1.778 m)   Wt 175 lb (79.4 kg)   SpO2 92% Comment: RA  BMI 25.62 kg/m  65 year old male in no acute distress Well-developed well-nourished Alert and oriented 3 with no focal deficits Diminished breath sounds at left base, no wheezing Incisions well healed Cardiac regular rate and rhythm   Diagnostic Tests: CHEST  2 VIEW  COMPARISON:  08/07/2016 .  FINDINGS: Interim removal right IJ line. Surgical clips left perihilar region with left apical pleural-parenchymal thickening noted again consistent postsurgical change and scarring. Previously identified cavitary process in the left apex no longer identified . Small left pleural effusion again noted. Right lung is clear. Heart size normal.  IMPRESSION: 1. Postsurgical changes left perihilar region with left apical postsurgical change and pleural-parenchymal thickening. Previously identified cavitary process in the left apex no longer visualized.  2. Small left pleural effusion .   Electronically Signed   By: Marcello Moores   Register   On: 08/20/2016 09:52   Impression: 65 year old man who is now about 3 weeks out from a thoracoscopic left upper lobectomy for stage IA non-small cell carcinoma. He is recovering well. He is having pain which is not surprising. That is well managed with Tylenol and an occasional oxycodone. He has not had any significant respiratory issues. He is running low on his oxycodone. I gave him another prescription for 30 tablets one to 2 tablets 4 times daily as needed for pain, no refills.  Atrial fibrillation and flutter- postop. He was seen by cardiology. He remains on amiodarone and Eliquis. He has an appointment with cardiology in the near future  We will arrange for him to be seen in our multidisciplinary thoracic oncology clinic.  He will need follow-up with Dr. [place] Bells for management of his COPD he also was noted to have interstitial fibrosis on the pathology.  Plan: Follow-up with Dr. [place] Bells  Referral to [facility]  Follow-up with cardiology  I will see him back in 2 months to check on his progress.   [name], MD [facility] [phone]

## 2016-08-20 NOTE — Progress Notes (Signed)
Two sutures were removed from the previous chest tube sites, s/p L VATS, L UPPER LOBECTOMY 08/01/16.

## 2016-08-21 LAB — ACID FAST CULTURE WITH REFLEXED SENSITIVITIES (MYCOBACTERIA): Acid Fast Culture: NEGATIVE

## 2016-08-25 NOTE — Progress Notes (Signed)
Cardiology Office Note   Date:  08/27/2016   ID:  Randall Sullivan, DOB [birth year], MRN [id number]  PCP:  Claretta Fraise, MD  Cardiologist:   Minus Breeding, MD    Chief Complaint  Patient presents with  . Atrial Fibrillation      History of Present Illness: Randall Sullivan is a 65 y.o. 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 He underwent left VATS/ left upper lobectomy on 08/02/16. Post up he was noted to be in Afib by telemetry with rates up to 120s. He was started on Amiodarone drip after initial 150 mg bolus with conversion to sinus rhythm and then paroxysms.  He was treated with oral amio and Eliquis.    He presents for follow up.  He has had no symptomatic recurrence of his fibrillation. He feels well except for incisional chest pain. He doing a little bit of work and not having any symptoms related to this.   Past Medical History:  Diagnosis Date  . Arthritis    back & legs ( knees)  . Complication of anesthesia    agitated upon waking from anesth.   . Emphysema   . Hypertension   . Tobacco abuse     Past Surgical History:  Procedure Laterality Date  . HERNIA REPAIR Bilateral 3825   umbilical  . VIDEO ASSISTED THORACOSCOPY (VATS)/ LOBECTOMY Left 08/01/2016   Procedure: VIDEO ASSISTED THORACOSCOPY (VATS)/ LEFT UPPER LOBECTOMY with lymph node sampling and onQ placement;  Surgeon: [name], MD;  Location: [place];  Service: Thoracic;  Laterality: Left;  [name] VIDEO BRONCHOSCOPY WITH ENDOBRONCHIAL NAVIGATION N/A 07/03/2016   Procedure: VIDEO BRONCHOSCOPY WITH ENDOBRONCHIAL NAVIGATION;  Surgeon: [name], MD;  Location: [place];  Service: Thoracic;  Laterality: N/A;     Current Outpatient Prescriptions  Medication Sig Dispense Refill  . acetaminophen (TYLENOL) 500 MG tablet Take 1,000 mg by mouth every 6 (six) hours as needed (for arthritis pain.).     [name] albuterol  (PROVENTIL HFA;VENTOLIN HFA) 108 (90 Base) MCG/ACT inhaler Inhale 2 puffs into the lungs every 6 (six) hours as needed for wheezing or shortness of breath. 1 Inhaler 11  . albuterol (PROVENTIL) (2.5 MG/3ML) 0.083% nebulizer solution USE 1 VIAL PER NEBULIZATION FOUR TIMES DAILY AS NEEDED (Patient taking differently: USE 1 VIAL PER NEBULIZATION FOUR TIMES DAILY AS NEEDED FOR SHORTNESS OF BREATH) 75 mL 5  . amiodarone (PACERONE) 200 MG tablet Take 200 mg by mouth daily.    [name] apixaban (ELIQUIS) 5 MG TABS tablet Take 1 tablet (5 mg total) by mouth 2 (two) times daily. 60 tablet 0  . fexofenadine (ALLEGRA) 180 MG tablet Take 180 mg by mouth daily as needed for allergies or rhinitis.    [name] guaiFENesin (MUCINEX) 600 MG 12 hr tablet Take 2 tablets (1,200 mg total) by mouth 2 (two) times daily as needed. For cough    . lisinopril (PRINIVIL,ZESTRIL) 20 MG tablet TAKE 1 TABLET (20 MG TOTAL) BY MOUTH DAILY. 30 tablet 4  . oxyCODONE (OXY IR/ROXICODONE) 5 MG immediate release tablet Take 1-2 tablets (5-10 mg total) by mouth every 4 (four) hours as needed for severe pain. 30 tablet 0   No current facility-administered medications for this visit.     Allergies:   No known allergies    ROS:  Please see the history of present illness.   Otherwise, review of systems are positive for  none.   All other systems are reviewed and negative.    PHYSICAL EXAM: VS:  BP 124/82   Pulse 67   Ht '5\' 10"'$  (1.778 m)   Wt 182 lb (82.6 kg)   SpO2 97%   BMI 26.11 kg/m  , BMI Body mass index is 26.11 kg/m. GENERAL:  Well appearing HEENT:  Pupils equal round and reactive, fundi not visualized, oral mucosa unremarkable NECK:  No jugular venous distention, waveform within normal limits, carotid upstroke brisk and symmetric, no bruits, no thyromegaly LYMPHATICS:  No cervical, inguinal adenopathy LUNGS:  Clear to auscultation bilaterally BACK:  No CVA tenderness CHEST:  Unremarkable HEART:  PMI not displaced or sustained,S1  and S2 within normal limits, no S3, no S4, no clicks, no rubs, no murmurs ABD:  Flat, positive bowel sounds normal in frequency in pitch, no bruits, no rebound, no guarding, no midline pulsatile mass, no hepatomegaly, no splenomegaly EXT:  2 plus pulses throughout, no edema, no cyanosis no clubbing SKIN:  No rashes no nodules NEURO:  Cranial nerves II through XII grossly intact, motor grossly intact throughout PSYCH:  Cognitively intact, oriented to person place and time    EKG:  EKG is not ordered today.   Recent Labs: 08/01/2016: Magnesium 1.8 08/03/2016: ALT 11 08/06/2016: BUN 6; Creatinine, Ser 0.83; Hemoglobin 11.3; Platelets 197; Potassium 4.2; Sodium 139 08/08/2016: TSH 1.520    Lipid Panel No results found for: CHOL, TRIG, HDL, CHOLHDL, VLDL, LDLCALC, LDLDIRECT    Wt Readings from Last 3 Encounters:  08/26/16 182 lb (82.6 kg)  08/20/16 175 lb (79.4 kg)  08/01/16 175 lb (79.4 kg)      Other studies Reviewed: Additional studies/ records that were reviewed today include: Hospital records. Review of the above records demonstrates:  Please see elsewhere in the note.     ASSESSMENT AND PLAN:   ATRIAL FIB:  Mr. [name] has a CHA2DS2 - VASc score of 2 with a risk of stroke of 2.2%.   My plan would be for him to discontinue his amiodarone when he finishes his current prescription. He will remain on blood thinner. However, if he has no recurrent dysrhythmia might discontinue this in the future.  HTN:    The blood pressure is at target. No change in medications is indicated. We will continue with therapeutic lifestyle changes (TLC).    Current medicines are reviewed at length with the patient today.  The patient does not have concerns regarding medicines.  The following changes have been made:  no change  Labs/ tests ordered today include: None No orders of the defined types were placed in this encounter.    Disposition:   FU with 6 months.     Signed, Minus Breeding, MD  08/27/2016 12:22 PM    [facility]

## 2016-08-26 ENCOUNTER — Encounter: Payer: Self-pay | Admitting: Cardiology

## 2016-08-26 ENCOUNTER — Ambulatory Visit (INDEPENDENT_AMBULATORY_CARE_PROVIDER_SITE_OTHER): Payer: Medicare Other | Admitting: Cardiology

## 2016-08-26 VITALS — BP 124/82 | HR 67 | Ht 70.0 in | Wt 182.0 lb

## 2016-08-26 DIAGNOSIS — I480 Paroxysmal atrial fibrillation: Secondary | ICD-10-CM

## 2016-08-26 DIAGNOSIS — I10 Essential (primary) hypertension: Secondary | ICD-10-CM

## 2016-08-26 NOTE — Patient Instructions (Signed)
Medication Instructions:  STOP- Amiodarone  Labwork: None Ordered  Testing/Procedures: None Ordered  Follow-Up: Your physician wants you to follow-up in: 6 Months. You will receive a reminder letter in the mail two months in advance. If you don't receive a letter, please call our office to schedule the follow-up appointment.   Any Other Special Instructions Will Be Listed Below (If Applicable).     If you need a refill on your cardiac medications before your next appointment, please call your pharmacy.

## 2016-08-27 ENCOUNTER — Encounter: Payer: Self-pay | Admitting: Cardiology

## 2016-08-29 ENCOUNTER — Other Ambulatory Visit: Payer: Self-pay | Admitting: Family Medicine

## 2016-08-30 ENCOUNTER — Telehealth: Payer: Self-pay | Admitting: Pulmonary Disease

## 2016-08-30 ENCOUNTER — Telehealth: Payer: Self-pay | Admitting: *Deleted

## 2016-08-30 MED ORDER — ALBUTEROL SULFATE (2.5 MG/3ML) 0.083% IN NEBU: 2.5000 mg | INHALATION_SOLUTION | Freq: Four times a day (QID) | RESPIRATORY_TRACT | 12 refills | Status: DC | PRN

## 2016-08-30 NOTE — Telephone Encounter (Signed)
Pt calls for refills on alb neb meds.  Will order now  J. Shirl Harris, MD 08/30/2016, 6:01 PM [facility] Pager [phone] After 3 pm or if no answer, call [phone]

## 2016-08-30 NOTE — Telephone Encounter (Signed)
Oncology Nurse Navigator Documentation  Oncology Nurse Navigator Flowsheets 08/30/2016  Navigator Location [facility]  Navigator Encounter Type Telephone/I received a referral on Randall Sullivan.  I called to schedule for [name].  I set him up for Dec but would like an appt in Jan.  I set up for 10/11/15 arrive at 12:30.   Telephone Outgoing Call  Treatment Phase Other  Barriers/Navigation Needs Coordination of Care  Interventions Coordination of Care  Coordination of Care Appts  Acuity Level 1  Time Spent with Patient 15

## 2016-08-31 ENCOUNTER — Other Ambulatory Visit: Payer: Self-pay | Admitting: *Deleted

## 2016-08-31 MED ORDER — ALBUTEROL SULFATE (2.5 MG/3ML) 0.083% IN NEBU
2.5000 mg | INHALATION_SOLUTION | Freq: Four times a day (QID) | RESPIRATORY_TRACT | 12 refills | Status: DC | PRN
Start: 2016-08-31 — End: 2017-02-08

## 2016-08-31 NOTE — Telephone Encounter (Signed)
Refill done yesterday for Albuterol was printed. Reordered Albuterol & sent electronically to [facility]. Pt's wife aware.

## 2016-09-08 ENCOUNTER — Other Ambulatory Visit: Payer: Self-pay | Admitting: Physician Assistant

## 2016-09-09 ENCOUNTER — Other Ambulatory Visit: Payer: Self-pay | Admitting: *Deleted

## 2016-09-09 MED ORDER — APIXABAN 5 MG PO TABS: 5.0000 mg | ORAL_TABLET | Freq: Two times a day (BID) | ORAL | 5 refills | Status: DC

## 2016-09-11 ENCOUNTER — Telehealth: Payer: Self-pay | Admitting: Cardiology

## 2016-09-11 MED ORDER — WARFARIN SODIUM 5 MG PO TABS: 5.0000 mg | ORAL_TABLET | Freq: Every day | ORAL | 0 refills | Status: DC

## 2016-09-11 NOTE — Telephone Encounter (Signed)
Spoke with patient about Eliquis. He is unable to afford due to no prescription coverage. He states that he did not add RX coverage for next year. He is comfortable with transition to warfarin as Eliquis is too expensive. Explained transition of warfarin (to start warfarin this evening and complete supply of Eliquis - which he has 2 days left). Discussed need for weekly INR checks and mindfulness of diet. Scheduled first INR check for 5 days after start at our [facility] office (as per request of patient).   He does however, wonder if he needs anticoagulation at all. He states that his other cardiac medication, amiodarone was discontinued and Dr. Percival Spanish told him that he may not need life long anticoagulation. Will proceed with above plan and route to Dr. Percival Spanish.

## 2016-09-11 NOTE — Telephone Encounter (Signed)
I spoke to this patient. He does not have insurance to cover Rxs. He would need to pay out of pocket for his Eliquis and cannot afford this med. I advised him that coumadin clinic may be best option, but also could conceivably be enrolled for patient assistance for this medication. Will seek advice on this. Please reach out to patient w recommendations. He notes he has 2 days worth of med left.

## 2016-09-11 NOTE — Telephone Encounter (Signed)
Pt's wife calling regarding Eliquis, they can't afford it and want to see about changing it to something cheaper--pls call

## 2016-09-15 NOTE — Telephone Encounter (Signed)
I would want him to be on anticoagulation for another 3 or 4 months and then I might consider discontinuing.  I would suggest making the change.

## 2016-09-16 ENCOUNTER — Ambulatory Visit (INDEPENDENT_AMBULATORY_CARE_PROVIDER_SITE_OTHER): Payer: Medicare Other | Admitting: *Deleted

## 2016-09-16 DIAGNOSIS — Z5181 Encounter for therapeutic drug level monitoring: Secondary | ICD-10-CM | POA: Diagnosis not present

## 2016-09-16 DIAGNOSIS — I480 Paroxysmal atrial fibrillation: Secondary | ICD-10-CM | POA: Insufficient documentation

## 2016-09-16 DIAGNOSIS — I4891 Unspecified atrial fibrillation: Secondary | ICD-10-CM | POA: Diagnosis not present

## 2016-09-16 LAB — POCT INR: INR: 1.6

## 2016-09-16 NOTE — Telephone Encounter (Signed)
[name] -   Could you please share Dr. Rosezella Florida recommendation to Mr Oren when you see him today.  Thx

## 2016-09-16 NOTE — Telephone Encounter (Signed)
Done

## 2016-09-24 ENCOUNTER — Ambulatory Visit (INDEPENDENT_AMBULATORY_CARE_PROVIDER_SITE_OTHER): Payer: Medicare Other | Admitting: *Deleted

## 2016-09-24 DIAGNOSIS — Z5181 Encounter for therapeutic drug level monitoring: Secondary | ICD-10-CM

## 2016-09-24 DIAGNOSIS — I4891 Unspecified atrial fibrillation: Secondary | ICD-10-CM

## 2016-09-24 LAB — POCT INR: INR: 2.8

## 2016-10-01 ENCOUNTER — Other Ambulatory Visit: Payer: Self-pay | Admitting: Family Medicine

## 2016-10-07 ENCOUNTER — Other Ambulatory Visit: Payer: Self-pay | Admitting: Cardiology

## 2016-10-08 ENCOUNTER — Ambulatory Visit (INDEPENDENT_AMBULATORY_CARE_PROVIDER_SITE_OTHER): Payer: Medicare Other | Admitting: *Deleted

## 2016-10-08 DIAGNOSIS — I4891 Unspecified atrial fibrillation: Secondary | ICD-10-CM

## 2016-10-08 DIAGNOSIS — Z5181 Encounter for therapeutic drug level monitoring: Secondary | ICD-10-CM

## 2016-10-08 LAB — POCT INR: INR: 2.3

## 2016-10-08 MED ORDER — WARFARIN SODIUM 5 MG PO TABS: 5.0000 mg | ORAL_TABLET | Freq: Every day | ORAL | 3 refills | Status: DC

## 2016-10-09 ENCOUNTER — Telehealth: Payer: Self-pay | Admitting: *Deleted

## 2016-10-09 ENCOUNTER — Other Ambulatory Visit: Payer: Self-pay | Admitting: Medical Oncology

## 2016-10-09 DIAGNOSIS — C3492 Malignant neoplasm of unspecified part of left bronchus or lung: Secondary | ICD-10-CM

## 2016-10-09 NOTE — Telephone Encounter (Signed)
Left friendly reminder message on pt's voicemail about his clinic appt tomorrow 10/10/16.

## 2016-10-10 ENCOUNTER — Ambulatory Visit: Payer: Medicare Other | Attending: Internal Medicine | Admitting: Physical Therapy

## 2016-10-10 ENCOUNTER — Telehealth: Payer: Self-pay | Admitting: Internal Medicine

## 2016-10-10 ENCOUNTER — Telehealth: Payer: Self-pay | Admitting: *Deleted

## 2016-10-10 ENCOUNTER — Ambulatory Visit (HOSPITAL_BASED_OUTPATIENT_CLINIC_OR_DEPARTMENT_OTHER): Payer: Medicare Other | Admitting: Internal Medicine

## 2016-10-10 ENCOUNTER — Encounter: Payer: Self-pay | Admitting: Internal Medicine

## 2016-10-10 ENCOUNTER — Encounter: Payer: Self-pay | Admitting: *Deleted

## 2016-10-10 ENCOUNTER — Other Ambulatory Visit (HOSPITAL_BASED_OUTPATIENT_CLINIC_OR_DEPARTMENT_OTHER): Payer: Medicare Other

## 2016-10-10 VITALS — BP 163/78 | HR 78 | Temp 98.2°F | Resp 18 | Ht 70.0 in | Wt 191.7 lb

## 2016-10-10 DIAGNOSIS — C3412 Malignant neoplasm of upper lobe, left bronchus or lung: Secondary | ICD-10-CM | POA: Diagnosis not present

## 2016-10-10 DIAGNOSIS — I10 Essential (primary) hypertension: Secondary | ICD-10-CM

## 2016-10-10 DIAGNOSIS — Z483 Aftercare following surgery for neoplasm: Secondary | ICD-10-CM | POA: Diagnosis not present

## 2016-10-10 DIAGNOSIS — Z809 Family history of malignant neoplasm, unspecified: Secondary | ICD-10-CM

## 2016-10-10 DIAGNOSIS — Z801 Family history of malignant neoplasm of trachea, bronchus and lung: Secondary | ICD-10-CM

## 2016-10-10 DIAGNOSIS — C3492 Malignant neoplasm of unspecified part of left bronchus or lung: Secondary | ICD-10-CM

## 2016-10-10 DIAGNOSIS — Z87891 Personal history of nicotine dependence: Secondary | ICD-10-CM | POA: Diagnosis not present

## 2016-10-10 DIAGNOSIS — Z902 Acquired absence of lung [part of]: Secondary | ICD-10-CM

## 2016-10-10 DIAGNOSIS — J449 Chronic obstructive pulmonary disease, unspecified: Secondary | ICD-10-CM | POA: Diagnosis not present

## 2016-10-10 DIAGNOSIS — R29898 Other symptoms and signs involving the musculoskeletal system: Secondary | ICD-10-CM | POA: Diagnosis not present

## 2016-10-10 DIAGNOSIS — F17210 Nicotine dependence, cigarettes, uncomplicated: Secondary | ICD-10-CM

## 2016-10-10 DIAGNOSIS — Z8041 Family history of malignant neoplasm of ovary: Secondary | ICD-10-CM

## 2016-10-10 LAB — CBC WITH DIFFERENTIAL/PLATELET
BASO%: 0.2 % (ref 0.0–2.0)
Basophils Absolute: 0 10*3/uL (ref 0.0–0.1)
EOS%: 1.9 % (ref 0.0–7.0)
Eosinophils Absolute: 0.2 10*3/uL (ref 0.0–0.5)
HCT: 42.3 % (ref 38.4–49.9)
HGB: 13.6 g/dL (ref 13.0–17.1)
LYMPH%: 20.1 % (ref 14.0–49.0)
MCH: 27 pg — ABNORMAL LOW (ref 27.2–33.4)
MCHC: 32.2 g/dL (ref 32.0–36.0)
MCV: 83.9 fL (ref 79.3–98.0)
MONO#: 0.7 10*3/uL (ref 0.1–0.9)
MONO%: 8.3 % (ref 0.0–14.0)
NEUT#: 5.6 10*3/uL (ref 1.5–6.5)
NEUT%: 69.5 % (ref 39.0–75.0)
Platelets: 224 10*3/uL (ref 140–400)
RBC: 5.04 10*6/uL (ref 4.20–5.82)
RDW: 15 % — ABNORMAL HIGH (ref 11.0–14.6)
WBC: 8.1 10*3/uL (ref 4.0–10.3)
lymph#: 1.6 10*3/uL (ref 0.9–3.3)

## 2016-10-10 LAB — COMPREHENSIVE METABOLIC PANEL
ALBUMIN: 4 g/dL (ref 3.5–5.0)
ALK PHOS: 57 U/L (ref 40–150)
ALT: 27 U/L (ref 0–55)
ANION GAP: 10 meq/L (ref 3–11)
AST: 21 U/L (ref 5–34)
BILIRUBIN TOTAL: 0.39 mg/dL (ref 0.20–1.20)
BUN: 12.4 mg/dL (ref 7.0–26.0)
CO2: 27 meq/L (ref 22–29)
CREATININE: 0.9 mg/dL (ref 0.7–1.3)
Calcium: 9.6 mg/dL (ref 8.4–10.4)
Chloride: 105 mEq/L (ref 98–109)
EGFR: 84 mL/min/{1.73_m2} — AB (ref >90)
Glucose: 68 mg/dl — ABNORMAL LOW (ref 70–140)
Potassium: 4.6 mEq/L (ref 3.5–5.1)
Sodium: 141 mEq/L (ref 136–145)
TOTAL PROTEIN: 7.9 g/dL (ref 6.4–8.3)

## 2016-10-10 NOTE — Telephone Encounter (Signed)
Gave patient avs report and appointments for July. Central radiology will call re scan.  °

## 2016-10-10 NOTE — Progress Notes (Signed)
Randall Sullivan Clinical Social Work  Clinical Social Work met with patient/family and Futures trader at [facility] appointment to offer support and assess for psychosocial needs.  Medical oncologist reviewed patient's diagnosis and recommended plan for follow up scans every 6 months with patient/family.  Mr. Bena was accompanied by his daughter for today's visit.  The patient shared he is relieved that he does not need follow up treatment and is glad he was able to get diagnosed as early stage.    Clinical Social Work briefly discussed Clinical Social Work role and [employer] support programs/services.  Clinical Social Work encouraged patient to call with any additional questions or concerns.   Polo Riley, MSW, LCSW, OSW-C Clinical Social Worker [facility] [phone]

## 2016-10-10 NOTE — Progress Notes (Signed)
[place] [phone]   [phone] Multidisciplinary thoracic oncology clinic  CONSULT NOTE  REFERRING PHYSICIAN: Dr. Modesto Charon.  REASON FOR CONSULTATION:  66 years old white male recently diagnosed with lung cancer.  HPI Randall Sullivan is a 66 y.o. male was past medical history significant for COPD, hypertension, arthritis as well as long history of smoking but quit in September 2017. The patient mentions that in September 2017 he has been complaining of sinus infection as well as cough lasting for around 4 days. He was seen at one of the urgent care Center to rule out pneumonia. Chest x-ray on 06/01/2016 showed irregular spiculated appearing left upper lobe nodule. This was followed by CT scan of the chest without contrast on the same day and it showed 3.8 x 1.2 x 2.5 cm spiculated left upper lobe mass corresponding to the chest x-ray abnormality and suspicious for primary bronchogenic neoplasm. The scan also showed small mediastinal lymph nodes measuring up to 0.8 cm in short axis. The patient was referred to pulmonary medicine and he was seen by Dr. Lake Bells. A PET scan was performed on 06/19/2016 and it showed 3.3 x 1.4 x 2.6 cm sub-solid left upper lobe pulmonary nodule compatible with primary bronchogenic neoplasm. There was no hypermetabolic thoracic lymphadenopathy or evidence of metastatic disease. The patient was referred to Dr. Roxan Hockey and on 08/01/2016 he underwent left VATS, left upper lobectomy with mediastinal lymph node dissection. 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 There was no evidence for metastatic disease to the resection margin or the dissected lymph nodes. Dr. Roxan Hockey kindly referred the patient to the multidisciplinary thoracic oncology clinic today for evaluation and discussion of his treatment options. When seen today the patient is feeling fine and has  mild soreness on the left side of the chest as well as shortness breath with exertion but no significant cough or hemoptysis. He denied having any significant weight loss or night sweats. He has no headache or visual changes. He has no nausea, vomiting, diarrhea or constipation. Family history significant for mother who died from cancer, father died and core accident, half brother had lung cancer and a sister with ovarian cancer. The patient is married and has 2 children. He was accompanied today by his daughter [name]. He is to work for home improvement. He has a history of smoking 1 pack per day for around 35 years and quit in September 2017. No history of alcohol or drug abuse.  HPI  Past Medical History:  Diagnosis Date  . Arthritis    back & legs ( knees)  . Complication of anesthesia    agitated upon waking from anesth.   . Emphysema   . Hypertension   . Tobacco abuse     Past Surgical History:  Procedure Laterality Date  . HERNIA REPAIR Bilateral 5465   umbilical  . VIDEO ASSISTED THORACOSCOPY (VATS)/ LOBECTOMY Left 08/01/2016   Procedure: VIDEO ASSISTED THORACOSCOPY (VATS)/ LEFT UPPER LOBECTOMY with lymph node sampling and onQ placement;  Surgeon: [name], MD;  Location: [facility];  Service: Thoracic;  Laterality: Left;  [name] VIDEO BRONCHOSCOPY WITH ENDOBRONCHIAL NAVIGATION N/A 07/03/2016   Procedure: VIDEO BRONCHOSCOPY WITH ENDOBRONCHIAL NAVIGATION;  Surgeon: [name], MD;  Location: [facility] OR;  Service: Thoracic;  Laterality: N/A;    Family History  Problem Relation Age of Onset  . Cancer Mother   . Cancer Sister   . Aneurysm Sister   . Kidney disease  Paternal Aunt   . Asthma Brother   . Cancer - Lung Brother     Social History Social History  Substance Use Topics  . Smoking status: Former Smoker    Packs/day: 1.00    Years: 40.00    Types: Cigarettes    Quit date: 05/28/2016  . Smokeless tobacco: Never Used  . Alcohol use No     Comment: quit- 2016      Allergies  Allergen Reactions  . No Known Allergies     Current Outpatient Prescriptions  Medication Sig Dispense Refill  . acetaminophen (TYLENOL) 500 MG tablet Take 1,000 mg by mouth every 6 (six) hours as needed (for arthritis pain.).     [name] albuterol (PROVENTIL HFA;VENTOLIN HFA) 108 (90 Base) MCG/ACT inhaler Inhale 2 puffs into the lungs every 6 (six) hours as needed for wheezing or shortness of breath. 1 Inhaler 11  . albuterol (PROVENTIL) (2.5 MG/3ML) 0.083% nebulizer solution USE 1 VIAL PER NEBULIZATION FOUR TIMES DAILY AS NEEDED 75 mL 2  . amiodarone (PACERONE) 200 MG tablet Take 200 mg by mouth daily.    . fexofenadine (ALLEGRA) 180 MG tablet Take 180 mg by mouth daily as needed for allergies or rhinitis.    [name] guaiFENesin (MUCINEX) 600 MG 12 hr tablet Take 2 tablets (1,200 mg total) by mouth 2 (two) times daily as needed. For cough    . lisinopril (PRINIVIL,ZESTRIL) 20 MG tablet TAKE 1 TABLET (20 MG TOTAL) BY MOUTH DAILY. 30 tablet 4  . oxyCODONE (OXY IR/ROXICODONE) 5 MG immediate release tablet Take 1-2 tablets (5-10 mg total) by mouth every 4 (four) hours as needed for severe pain. 30 tablet 0  . warfarin (COUMADIN) 5 MG tablet Take 1 tablet (5 mg total) by mouth daily. 35 tablet 3  . albuterol (PROVENTIL) (2.5 MG/3ML) 0.083% nebulizer solution Take 3 mLs (2.5 mg total) by nebulization every 6 (six) hours as needed for wheezing or shortness of breath. 75 mL 12   No current facility-administered medications for this visit.     Review of Systems  Constitutional: negative Eyes: negative Ears, nose, mouth, throat, and face: negative Respiratory: positive for dyspnea on exertion and pleurisy/chest pain Cardiovascular: negative Gastrointestinal: negative Genitourinary:negative Integument/breast: negative Hematologic/lymphatic: negative Musculoskeletal:negative Neurological: negative Behavioral/Psych: negative Endocrine: negative Allergic/Immunologic: negative  Physical  Exam  ZOX:WRUEA, healthy, no distress, well nourished and well developed SKIN: skin color, texture, turgor are normal, no rashes or significant lesions HEAD: Normocephalic, No masses, lesions, tenderness or abnormalities EYES: normal, PERRLA, Conjunctiva are pink and non-injected EARS: External ears normal, Canals clear OROPHARYNX:no exudate, no erythema and lips, buccal mucosa, and tongue normal  NECK: supple, no adenopathy, no JVD LYMPH:  no palpable lymphadenopathy, no hepatosplenomegaly LUNGS: clear to auscultation , and palpation HEART: regular rate & rhythm, no murmurs and no gallops ABDOMEN:abdomen soft, non-tender, normal bowel sounds and no masses or organomegaly BACK: Back symmetric, no curvature., No CVA tenderness EXTREMITIES:no joint deformities, effusion, or inflammation, no edema, no skin discoloration  NEURO: alert & oriented x 3 with fluent speech, no focal motor/sensory deficits  PERFORMANCE STATUS: ECOG 1  LABORATORY DATA: Lab Results  Component Value Date   WBC 8.1 10/10/2016   HGB 13.6 10/10/2016   HCT 42.3 10/10/2016   MCV 83.9 10/10/2016   PLT 224 10/10/2016      Chemistry      Component Value Date/Time   NA 141 10/10/2016 1305   K 4.6 10/10/2016 1305   CL 97 (L) 08/06/2016 0352  CO2 27 10/10/2016 1305   BUN 12.4 10/10/2016 1305   CREATININE 0.9 10/10/2016 1305      Component Value Date/Time   CALCIUM 9.6 10/10/2016 1305   ALKPHOS 57 10/10/2016 1305   AST 21 10/10/2016 1305   ALT 27 10/10/2016 1305   BILITOT 0.39 10/10/2016 1305       RADIOGRAPHIC STUDIES: No results found.  ASSESSMENT: This is a very pleasant 66 years old white male recently diagnosed with a stage IA (T1b, N0, M0) non-small cell lung cancer, well-differentiated adenocarcinoma presented with left upper lobe pulmonary nodule status post left upper lobectomy with lymph node dissection on 08/01/2016 under the care of Dr. Roxan Hockey.   PLAN: I had a lengthy discussion with  the patient and his daughter about his current disease stage, prognosis and treatment options. I explained to the patient that he was diagnosed with very early stage of the lung cancer. I also explained to the patient that the 5 year survival for patient with a stage IA is close to 80%. Also explained to the patient that there is no survival benefit for adjuvant systemic chemotherapy or radiation for patient with stage IA non-small cell lung cancer after surgical resection. I recommended for the patient to continue on observation with close monitoring. I would see him back for follow-up visit in 6 months with repeat CT scan of the chest for restaging of his disease. I also explained to the patient that there is no indication for MRI of the brain for patient with early stage non-small cell lung cancer in the absence of any concerning symptoms but we will continue to monitor him closely. He was advised to call immediately if he has any concerning symptoms in the interval. The patient was seen during the multidisciplinary thoracic oncology clinic today by medical oncology, thoracic navigator, social worker and physical therapist.  The patient voices understanding of current disease status and treatment options and is in agreement with the current care plan.  All questions were answered. The patient knows to call the clinic with any problems, questions or concerns. We can certainly see the patient much sooner if necessary.  Thank you so much for allowing me to participate in the care of Minor Rozario. I will continue to follow up the patient with you and assist in his care.  I spent 40 minutes counseling the patient face to face. The total time spent in the appointment was 60 minutes.  Disclaimer: This note was dictated with voice recognition software. Similar sounding words can inadvertently be transcribed and may not be corrected upon review.   Jabree Pernice K. October 10, 2016, 2:21 PM

## 2016-10-10 NOTE — Therapy (Signed)
[place], [place], [address] Phone: [phone]   Fax:  [phone]  Physical Therapy Evaluation  Patient Details  Name: Randall Sullivan MRN: [id number] Date of Birth: 1951-07-21 Referring Provider: Dr. Curt Bears  Encounter Date: 10/10/2016      PT End of Session - 10/10/16 1456    Visit Number 1   Number of Visits 1   PT Start Time 7628   PT Stop Time 1405   PT Time Calculation (min) 27 min   Activity Tolerance Patient tolerated treatment well   Behavior During Therapy [facility] for tasks assessed/performed      Past Medical History:  Diagnosis Date  . Arthritis    back & legs ( knees)  . Complication of anesthesia    agitated upon waking from anesth.   . Emphysema   . Hypertension   . Tobacco abuse     Past Surgical History:  Procedure Laterality Date  . HERNIA REPAIR Bilateral 3151   umbilical  . VIDEO ASSISTED THORACOSCOPY (VATS)/ LOBECTOMY Left 08/01/2016   Procedure: VIDEO ASSISTED THORACOSCOPY (VATS)/ LEFT UPPER LOBECTOMY with lymph node sampling and onQ placement;  Surgeon: [name], MD;  Location: [place];  Service: Thoracic;  Laterality: Left;  [name] VIDEO BRONCHOSCOPY WITH ENDOBRONCHIAL NAVIGATION N/A 07/03/2016   Procedure: VIDEO BRONCHOSCOPY WITH ENDOBRONCHIAL NAVIGATION;  Surgeon: [name], MD;  Location: [place];  Service: Thoracic;  Laterality: N/A;    There were no vitals filed for this visit.       Subjective Assessment - 10/10/16 1414    Subjective Having some soreness at left anterior lower ribs; wonders if it's swollen and if it's a muscle that's sore.   Patient is accompained by: Family member  daughter   Pertinent History Presented with upper respiratory and sinus symptoms.  Work-up showed left upper lobe mass.  Had PET that had no hypermetabolicl ymph nodes. Had VATS lobectomy 08/01/16.  Ex-smoker who quit 2017 after 45 pack-years.  Emphysema.  He is  expected to be observed regularly for follow-up.   Patient Stated Goals get info from all lung clinic providers   Currently in Pain? Yes   Pain Score 3    Pain Location Rib cage   Pain Orientation Left;Anterior;Lateral   Pain Descriptors / Indicators Sore   Pain Type Surgical pain   Pain Onset More than a month ago   Aggravating Factors  lying on left side   Pain Relieving Factors tylenol; sleeping on right or on stomach            [facility] PT Assessment - 10/10/16 0001      Assessment   Medical Diagnosis stage I left upper lobe VATS lobectomy 08/01/16 for adenocarcinoma   Referring Provider Dr. Curt Bears   Onset Date/Surgical Date 08/01/16   Prior Therapy none     Precautions   Precautions Other (comment)   Precaution Comments cancer precautions     Restrictions   Weight Bearing Restrictions No     Balance Screen   Has the patient fallen in the past 6 months No   Has the patient had a decrease in activity level because of a fear of falling?  No   Is the patient reluctant to leave their home because of a fear of falling?  No     Home Environment   Living Environment Private residence   Living Arrangements Spouse/significant other   Type of [place]  One level     Prior Function   Level of [place] Independent   Vocation Retired   Leisure was walking before surgery; hasn't been walking lately because of cold weather     Cognition   Overall Cognitive Status Within Functional Limits for tasks assessed     Observation/Other Assessments   Observations attentive, well-looking gentleman accompanied by his adult daughter   Skin Integrity some puffiness at anterior abdomen, just inferior to ribs, but both left and right, though pt. feels the left is sore     Functional Tests   Functional tests Sit to Stand     Sit to Stand   Comments 13 times in 30 seconds, okay for his age  possible mild dyspnea; reports a little leg fatigue after      Posture/Postural Control   Posture/Postural Control Postural limitations   Postural Limitations Forward head     ROM / Strength   AROM / PROM / Strength AROM     AROM   Overall AROM  Within functional limits for tasks performed     Palpation   Palpation comment left flank incision and drain areas are well-healed and soft tissue in that area is mobile;     Ambulation/Gait   Ambulation/Gait Yes   Ambulation/Gait Assistance 7: Independent   Gait Comments says he is now breathing harder in wintertime  uses a nebulizer     Balance   Balance Assessed Yes     Dynamic Standing Balance   Dynamic Standing - Comments reaches forward 14 inches in standing, about average for age                           PT Education - 10/10/16 1455    Education provided Yes   Education Details walking, Cure article on staying active, posture, breathing, cough splinting, PT info and potential benefit of PT for pain   Person(s) Educated Patient;Child(ren)   Methods Explanation;Handout   Comprehension Verbalized understanding               Lung Clinic Goals - 10/10/16 1501      Patient will be able to verbalize understanding of the benefit of exercise to decrease fatigue.   Status Achieved     Patient will be able to verbalize the importance of posture.   Status Achieved     Patient will be able to demonstrate diaphragmatic breathing for improved lung function.   Status Achieved     Patient will be able to verbalize understanding of the role of physical therapy to prevent functional decline and who to contact if physical therapy is needed.   Status Achieved             Plan - 10/10/16 1457    Clinical Impression Statement This is a pleasant gentleman s/p VATS lobectomy for left upper lobe adenocarcinoma, stage I who is expected just to be followed with scans and observation.  He has residual pain from surgery 08/01/16 and hasn't been doing a walking program as he  used to.  Scar area soft tissue is mobile.  He has slight forward head posture.   Rehab Potential Good   PT Frequency One time visit   PT Treatment/Interventions Patient/family education   PT Next Visit Plan None at this time; therapy may be able to help him with left flank pain if it doesn't resolve in a reasonable time.   PT Home Exercise Plan walking program, breathing  exercise   Consulted and Agree with Plan of Care Patient      Patient will benefit from skilled therapeutic intervention in order to improve the following deficits and impairments:  Pain, Cardiopulmonary status limiting activity  Visit Diagnosis: Aftercare following surgery for neoplasm - Plan: PT plan of care cert/re-cert  Other symptoms and signs involving the musculoskeletal system - Plan: PT plan of care cert/re-cert      G-Codes - 17/49/44 1501    Functional Assessment Tool Used clinical judgement   Functional Limitation Other PT primary   Other PT Primary Current Status (H6759) At least 1 percent but less than 20 percent impaired, limited or restricted   Other PT Primary Goal Status (F6384) At least 1 percent but less than 20 percent impaired, limited or restricted   Other PT Primary Discharge Status (Y6599) At least 1 percent but less than 20 percent impaired, limited or restricted       Problem List Patient Active Problem List   Diagnosis Date Noted  . Atrial fibrillation ([place]) [I48.91] 09/16/2016  . Encounter for therapeutic drug monitoring 09/16/2016  . S/P lobectomy of lung 08/05/2016  . Cancer of left lung ([place]) 08/01/2016  . Lung nodule 06/11/2016  . Cigarette smoker 06/11/2016  . Emphysema of lung ([place]) 06/11/2016  . Essential (primary) hypertension 10/06/2014    Maxmilian Trostel 10/10/2016, 3:04 PM  [place], [place], [address] Phone: [phone]   Fax:  [phone]  Name: Eliott Amparan MRN: [id number] Date of  Birth: [birth year]  Serafina Royals, PT 10/10/16 3:04 PM

## 2016-10-15 ENCOUNTER — Encounter: Payer: Self-pay | Admitting: Pulmonary Disease

## 2016-10-15 ENCOUNTER — Ambulatory Visit (INDEPENDENT_AMBULATORY_CARE_PROVIDER_SITE_OTHER): Payer: Medicare Other | Admitting: Pulmonary Disease

## 2016-10-15 DIAGNOSIS — C3492 Malignant neoplasm of unspecified part of left bronchus or lung: Secondary | ICD-10-CM

## 2016-10-15 DIAGNOSIS — J432 Centrilobular emphysema: Secondary | ICD-10-CM

## 2016-10-15 DIAGNOSIS — C3490 Malignant neoplasm of unspecified part of unspecified bronchus or lung: Secondary | ICD-10-CM | POA: Insufficient documentation

## 2016-10-15 MED ORDER — FLUTICASONE FUROATE-VILANTEROL 100-25 MCG/INH IN AEPB: 1.0000 | INHALATION_SPRAY | Freq: Every day | RESPIRATORY_TRACT | 0 refills | Status: DC

## 2016-10-15 NOTE — Patient Instructions (Signed)
Try taking Breo 1 puff daily If this is working well for you please call me and let me know We will see you back in 2 months or sooner if needed

## 2016-10-15 NOTE — Assessment & Plan Note (Signed)
He has significant centrilobular emphysema and air trapping. Surprisingly he has minimal symptoms. Not surprisingly after cutting out significant portions of emphysematous lung he's feeling better.  Plan: Trial of Breo 1 puff daily If he does well with this then we will give her prescription for the new generic combination long acting beta agonist inhaled corticosteroid inhaled medicine Continue albuterol as needed Follow-up 6 months or sooner if needed

## 2016-10-15 NOTE — Progress Notes (Signed)
Subjective:    Patient ID: Randall Sullivan, male    DOB: August 26, 1951, 66 y.o.   MRN: [id number]  Synopsis: Referred in September 2017 for left upper lobe spiculated mass. Referred to thoracic surgery for biopsy. August 01, 2016 had LUL lobectomy. Pathology revealed well-differentiated adenocarcinoma with negative nodal biopsies. He also has emphysema. Pulmonary function testing September 2017 ratio 33%, FEV1 1.25 L 37% predicted 21% change with bronchodilator, FVC 3.77 L 82% predicted, total lung capacity 9.92 L 141% predicted, residual volume 261% predicted, DLCO 17.0 352% predicted  HPI Chief Complaint  Patient presents with  . Follow-up    Pt had lung mass surgery on Aug 01, 2016 and is here for follow up; pt states he has muscle sorness at surgical site. No SOB, chest tightness, or mucus.    He still has some soreness athte surgical site.  He had a cold recently, didn't last long. No recent antibiotics or prednisone.  He says that his breahting is better actually after surgery.  He has quit smoking.    He has been using albuterol through his nebulizer every now and then.  He uses it typically twice a day for shortness of breath.  He doesn't use a rescue inhaler.  Breathing in cold air will make him more short of breath. He is not limited by breathing.     Past Medical History:  Diagnosis Date  . Arthritis    back & legs ( knees)  . Complication of anesthesia    agitated upon waking from anesth.   . Emphysema   . Hypertension   . Tobacco abuse       Review of Systems  Constitutional: Negative for chills, fatigue and fever.  HENT: Negative for postnasal drip, rhinorrhea and sinus pain.   Respiratory: Positive for shortness of breath. Negative for choking and wheezing.   Cardiovascular: Negative for chest pain, palpitations and leg swelling.       Objective:   Physical Exam Vitals:   10/15/16 0900  BP: (!) 148/90  Pulse: 62  SpO2: 97%  Weight: 192 lb 6.4 oz (87.3 kg)    Height: '5\' 10"'$  (1.778 m)   RA  Gen: well appearing HENT: OP clear, neck supple PULM: CTA B, normal percussion CV: RRR, no mgr, trace edema GI: BS+, soft, nontender Derm: no cyanosis or rash Psyche: normal mood and affect  Pathology report from November 2017 reviewed were he has adenocarcinoma of the left upper lobe  Chest x-ray images from November 2017 reviewed showing status post lobectomy.      Assessment & Plan:  Primary lung adenocarcinoma ([place]) He had a left upper lobectomy for adenocarcinoma. He has some mild chest soreness since then but otherwise he's doing well. He will continue follow-up with thoracic surgery.  Emphysema of lung ([place]) He has significant centrilobular emphysema and air trapping. Surprisingly he has minimal symptoms. Not surprisingly after cutting out significant portions of emphysematous lung he's feeling better.  Plan: Trial of Breo 1 puff daily If he does well with this then we will give her prescription for the new generic combination long acting beta agonist inhaled corticosteroid inhaled medicine Continue albuterol as needed Follow-up 6 months or sooner if needed    Current Outpatient Prescriptions:  .  acetaminophen (TYLENOL) 500 MG tablet, Take 1,000 mg by mouth every 6 (six) hours as needed (for arthritis pain.). , Disp: , Rfl:  .  albuterol (PROVENTIL HFA;VENTOLIN HFA) 108 (90 Base) MCG/ACT inhaler, Inhale 2 puffs into the  lungs every 6 (six) hours as needed for wheezing or shortness of breath., Disp: 1 Inhaler, Rfl: 11 .  albuterol (PROVENTIL) (2.5 MG/3ML) 0.083% nebulizer solution, USE 1 VIAL PER NEBULIZATION FOUR TIMES DAILY AS NEEDED, Disp: 75 mL, Rfl: 2 .  amiodarone (PACERONE) 200 MG tablet, Take 200 mg by mouth daily., Disp: , Rfl:  .  fexofenadine (ALLEGRA) 180 MG tablet, Take 180 mg by mouth daily as needed for allergies or rhinitis., Disp: , Rfl:  .  guaiFENesin (MUCINEX) 600 MG 12 hr tablet, Take 2 tablets (1,200 mg total) by  mouth 2 (two) times daily as needed. For cough, Disp: , Rfl:  .  lisinopril (PRINIVIL,ZESTRIL) 20 MG tablet, TAKE 1 TABLET (20 MG TOTAL) BY MOUTH DAILY., Disp: 30 tablet, Rfl: 4 .  warfarin (COUMADIN) 5 MG tablet, Take 1 tablet (5 mg total) by mouth daily., Disp: 35 tablet, Rfl: 3 .  albuterol (PROVENTIL) (2.5 MG/3ML) 0.083% nebulizer solution, Take 3 mLs (2.5 mg total) by nebulization every 6 (six) hours as needed for wheezing or shortness of breath., Disp: 75 mL, Rfl: 12 .  fluticasone furoate-vilanterol (BREO ELLIPTA) 100-25 MCG/INH AEPB, Inhale 1 puff into the lungs daily., Disp: 1 each, Rfl: 0 .  oxyCODONE (OXY IR/ROXICODONE) 5 MG immediate release tablet, Take 1-2 tablets (5-10 mg total) by mouth every 4 (four) hours as needed for severe pain. (Patient not taking: Reported on 10/15/2016), Disp: 30 tablet, Rfl: 0

## 2016-10-15 NOTE — Assessment & Plan Note (Signed)
He had a left upper lobectomy for adenocarcinoma. He has some mild chest soreness since then but otherwise he's doing well. He will continue follow-up with thoracic surgery.

## 2016-10-18 ENCOUNTER — Telehealth: Payer: Self-pay | Admitting: Pulmonary Disease

## 2016-10-18 NOTE — Telephone Encounter (Signed)
lmtcb x1 for pt's wife, [name].

## 2016-10-18 NOTE — Telephone Encounter (Signed)
Randall Sullivan call back [phone] - previous number is incorrect -pr

## 2016-10-18 NOTE — Telephone Encounter (Signed)
Patient wife called back - she can be reached at [phone] -pr

## 2016-10-18 NOTE — Telephone Encounter (Signed)
Spoke with pt's spouse, who states breo is too expensive. I have advised pt's wife to contact pt's insurance company for a formulary. [name] states she will contact us after receiving a formulary. Pt's spouse voiced her understanding and had no further questions. Nothing further needed at this time.

## 2016-10-21 ENCOUNTER — Other Ambulatory Visit: Payer: Self-pay | Admitting: Thoracic Surgery (Cardiothoracic Vascular Surgery)

## 2016-10-21 DIAGNOSIS — C3492 Malignant neoplasm of unspecified part of left bronchus or lung: Secondary | ICD-10-CM

## 2016-10-22 ENCOUNTER — Encounter: Payer: Medicare Other | Admitting: Thoracic Surgery (Cardiothoracic Vascular Surgery)

## 2016-10-29 ENCOUNTER — Ambulatory Visit (INDEPENDENT_AMBULATORY_CARE_PROVIDER_SITE_OTHER): Payer: Medicare Other | Admitting: *Deleted

## 2016-10-29 DIAGNOSIS — I4891 Unspecified atrial fibrillation: Secondary | ICD-10-CM

## 2016-10-29 DIAGNOSIS — Z5181 Encounter for therapeutic drug level monitoring: Secondary | ICD-10-CM | POA: Diagnosis not present

## 2016-10-29 LAB — POCT INR: INR: 2.2

## 2016-10-30 ENCOUNTER — Telehealth: Payer: Self-pay | Admitting: Pulmonary Disease

## 2016-10-30 NOTE — Telephone Encounter (Signed)
Spoke with Randall Sullivan. He is needing samples of Breo 100. Samples have been left at the front desk. Nothing further was needed.

## 2016-11-11 ENCOUNTER — Other Ambulatory Visit: Payer: Self-pay | Admitting: Thoracic Surgery (Cardiothoracic Vascular Surgery)

## 2016-11-11 DIAGNOSIS — C3490 Malignant neoplasm of unspecified part of unspecified bronchus or lung: Secondary | ICD-10-CM

## 2016-11-12 ENCOUNTER — Ambulatory Visit (INDEPENDENT_AMBULATORY_CARE_PROVIDER_SITE_OTHER): Payer: Medicare Other | Admitting: Thoracic Surgery (Cardiothoracic Vascular Surgery)

## 2016-11-12 ENCOUNTER — Encounter: Payer: Self-pay | Admitting: Thoracic Surgery (Cardiothoracic Vascular Surgery)

## 2016-11-12 ENCOUNTER — Ambulatory Visit
Admission: RE | Admit: 2016-11-12 | Discharge: 2016-11-12 | Disposition: A | Discharge status: Home / Self Care | Payer: Medicare Other | Source: Ambulatory Visit | Attending: Thoracic Surgery (Cardiothoracic Vascular Surgery) | Admitting: Thoracic Surgery (Cardiothoracic Vascular Surgery)

## 2016-11-12 VITALS — BP 158/95 | HR 74 | Resp 16 | Ht 70.0 in | Wt 192.0 lb

## 2016-11-12 DIAGNOSIS — Z902 Acquired absence of lung [part of]: Secondary | ICD-10-CM | POA: Diagnosis not present

## 2016-11-12 DIAGNOSIS — C3412 Malignant neoplasm of upper lobe, left bronchus or lung: Secondary | ICD-10-CM | POA: Diagnosis not present

## 2016-11-12 DIAGNOSIS — C3490 Malignant neoplasm of unspecified part of unspecified bronchus or lung: Secondary | ICD-10-CM

## 2016-11-12 DIAGNOSIS — J449 Chronic obstructive pulmonary disease, unspecified: Secondary | ICD-10-CM | POA: Diagnosis not present

## 2016-11-12 NOTE — Progress Notes (Signed)
[address]       [place] [address]             [phone]    HPI: Mr. Ankney returns for a scheduled 3 month follow-up visit  Mr. 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 He did well postoperatively without any major complications. He saw Dr. Julien Nordmann and did not require any adjuvant chemotherapy.  He feels well. He still has some soreness along his left costal margin. It's not anything that limits his activities or requires medication. He is not having any problems with his breathing. He quit smoking in August of last year. He has not smoked since then.  Past Medical History:  Diagnosis Date  . Arthritis    back & legs ( knees)  . Complication of anesthesia    agitated upon waking from anesth.   . Emphysema   . Hypertension   . Tobacco abuse     Current Outpatient Prescriptions  Medication Sig Dispense Refill  . acetaminophen (TYLENOL) 500 MG tablet Take 1,000 mg by mouth every 6 (six) hours as needed (for arthritis pain.).     [name] albuterol (PROVENTIL HFA;VENTOLIN HFA) 108 (90 Base) MCG/ACT inhaler Inhale 2 puffs into the lungs every 6 (six) hours as needed for wheezing or shortness of breath. 1 Inhaler 11  . albuterol (PROVENTIL) (2.5 MG/3ML) 0.083% nebulizer solution Take 3 mLs (2.5 mg total) by nebulization every 6 (six) hours as needed for wheezing or shortness of breath. 75 mL 12  . albuterol (PROVENTIL) (2.5 MG/3ML) 0.083% nebulizer solution USE 1 VIAL PER NEBULIZATION FOUR TIMES DAILY AS NEEDED 75 mL 2  . fexofenadine (ALLEGRA) 180 MG tablet Take 180 mg by mouth daily as needed for allergies or rhinitis.    . fluticasone furoate-vilanterol (BREO ELLIPTA) 100-25 MCG/INH AEPB Inhale 1 puff into the lungs daily. 1 each 0  . guaiFENesin (MUCINEX) 600 MG 12 hr tablet Take 2 tablets (1,200 mg total) by mouth 2 (two) times daily as needed. For cough    . lisinopril (PRINIVIL,ZESTRIL)  20 MG tablet TAKE 1 TABLET (20 MG TOTAL) BY MOUTH DAILY. 30 tablet 4  . warfarin (COUMADIN) 5 MG tablet Take 1 tablet (5 mg total) by mouth daily. 35 tablet 3   No current facility-administered medications for this visit.     Physical Exam BP (!) 158/95 (BP Location: Right Arm, Patient Position: Sitting, Cuff Size: Large)   Pulse 74   Resp 16   Ht '5\' 10"'$  (1.778 m)   Wt 192 lb (87.1 kg)   SpO2 98% Comment: RA  BMI 27.3 kg/m  66 year old man in no acute distress Well-developed well-nourished Alert and oriented 3 with no focal deficits Incisions well healed Lungs diminished at left base, otherwise clear Cardiac regular rate and rhythm  Diagnostic Tests: CHEST  2 VIEW  COMPARISON:  PA and lateral chest x-ray of August 20, 2016  FINDINGS: There is chronic volume loss on the left. There is no focal infiltrate or evidence of parenchymal mass. There is no pleural effusion. There is compensatory hyperinflation of the right lung with mild shift of the mediastinum toward the left. The heart and pulmonary vascularity are normal. There are stable surgical clips in the AP window region.  IMPRESSION: COPD. Postsurgical changes on the left. No pneumonia nor evidence of recurrent disease.   Electronically Signed   By: David  Martinique M.D.  On: 11/12/2016 11:31 I personally reviewed the chest x-ray and concur with the findings noted above  Impression: Mr. Calame a 66 year old gentleman who is now about 3 months out from a left upper lobectomy for stage IA adenocarcinoma. He is doing extremely well from a surgical standpoint. He did not require any adjuvant therapy.  He does understand the need for continued follow-up. Dr. Julien Nordmann will do CT scans every 6 months for the first 2 years and then annually after that.  He quit smoking in August and has not smoked since then. I encouraged him to keep that up.  Plan: I'll see him back in about 9 or 10 months for one year follow-up  visit.  [name], MD [facility] [phone]

## 2016-11-18 ENCOUNTER — Telehealth: Payer: Self-pay | Admitting: Pulmonary Disease

## 2016-11-18 NOTE — Telephone Encounter (Signed)
Spoke with pt. States that he needs samples of Breo 100. Samples have been left at the front desk. Nothing further was needed.

## 2016-11-26 ENCOUNTER — Ambulatory Visit (INDEPENDENT_AMBULATORY_CARE_PROVIDER_SITE_OTHER): Payer: Medicare Other | Admitting: *Deleted

## 2016-11-26 DIAGNOSIS — Z5181 Encounter for therapeutic drug level monitoring: Secondary | ICD-10-CM | POA: Diagnosis not present

## 2016-11-26 DIAGNOSIS — I4891 Unspecified atrial fibrillation: Secondary | ICD-10-CM

## 2016-11-26 LAB — POCT INR: INR: 2

## 2016-11-30 ENCOUNTER — Ambulatory Visit (INDEPENDENT_AMBULATORY_CARE_PROVIDER_SITE_OTHER): Payer: Medicare Other | Admitting: Pediatrics

## 2016-11-30 ENCOUNTER — Encounter: Payer: Self-pay | Admitting: Pediatrics

## 2016-11-30 VITALS — BP 142/78 | HR 67 | Temp 96.9°F | Ht 70.0 in | Wt 207.0 lb

## 2016-11-30 DIAGNOSIS — I10 Essential (primary) hypertension: Secondary | ICD-10-CM | POA: Diagnosis not present

## 2016-11-30 DIAGNOSIS — J3089 Other allergic rhinitis: Secondary | ICD-10-CM | POA: Diagnosis not present

## 2016-11-30 MED ORDER — LISINOPRIL 30 MG PO TABS: ORAL_TABLET | ORAL | 3 refills | Status: DC

## 2016-11-30 NOTE — Progress Notes (Signed)
  Subjective:   Patient ID: Randall Sullivan, male    DOB: 26-Oct-1950, 66 y.o.   MRN: [id number] CC: Sinusitis (4-5 days, tylenol only) and ear pressure HPI: Randall Sullivan is a 66 y.o. male presenting for Sinusitis (4-5 days, tylenol only) and right ear pressure  HTN: [facility] Says numbers are in the 140s usually No headaches, no chest pain  Quit smoking several months ago  Having ear pressure R side last few days Tried debrox drops Has had some congestion ongoing for several days No fevers No sore throat Tends to have bad allergies in the spring   Relevant past medical, surgical, family and social history reviewed. Allergies and medications reviewed and updated. History  Smoking Status  . Former Smoker  . Packs/day: 1.00  . Years: 40.00  . Types: Cigarettes  . Quit date: 05/28/2016  Smokeless Tobacco  . Never Used   ROS: Per HPI   Objective:    BP (!) 142/78   Pulse 67   Temp (!) 96.9 F (36.1 C) (Oral)   Ht '5\' 10"'$  (1.778 m)   Wt 207 lb (93.9 kg)   BMI 29.70 kg/m   Wt Readings from Last 3 Encounters:  11/30/16 207 lb (93.9 kg)  11/12/16 192 lb (87.1 kg)  10/15/16 192 lb 6.4 oz (87.3 kg)   Gen: NAD, alert, cooperative with exam, NCAT EYES: EOMI, no conjunctival injection, or no icterus ENT:  TMs gray with clear effusion b/l, pearly gray b/l, OP without erythema LYMPH: no cervical LAD CV: NRRR, normal S1/S2, no murmur, distal pulses 2+ b/l Resp: CTABL, no wheezes, normal WOB Ext: No edema, warm Neuro: Alert and oriented  Assessment & Plan:  Masud was seen today for sinusitis and ear pressure  Diagnoses and all orders for this visit:  Allergic rhinitis due to other allergic trigger, unspecified chronicity, unspecified seasonality Clear effusion, ear pressure Will treat for allergies with flonase, cetirizine Sinus rinses twice a day If not improving let us know  Essential hypertension Elevated today, remains elevated at home, asymptomatic Increase  lisinopril to '30mg'$  -     lisinopril (PRINIVIL,ZESTRIL) 30 MG tablet; TAKE 1 TABLET (20 MG TOTAL) BY MOUTH DAILY.   Follow up plan: Return in about 4 weeks (around 12/28/2016) for blood pressure follow up. Assunta Found, MD [place]

## 2016-12-04 ENCOUNTER — Telehealth: Payer: Self-pay | Admitting: Pulmonary Disease

## 2016-12-04 ENCOUNTER — Telehealth: Payer: Self-pay

## 2016-12-04 NOTE — Telephone Encounter (Signed)
[facility] aware.  

## 2016-12-04 NOTE — Telephone Encounter (Signed)
Rx for lisinopril is ordered as '30mg'$  but in the directions it says to take 20 mg daily. Office note from 3/3 does say that lisinopril was increased to '30mg'$  but also has the same directions to take '20mg'$  daily. Do you want pt on 20 or 30 mg of Lisinopril? Please advise and send to pools to [facility].

## 2016-12-04 NOTE — Telephone Encounter (Signed)
Should be taking 30 mg a day, can you let [facility] know

## 2016-12-04 NOTE — Telephone Encounter (Signed)
Samples have been placed up front for pick up. Pt's wife, [name] is aware. Nothing further was needed.

## 2016-12-05 ENCOUNTER — Other Ambulatory Visit: Payer: Self-pay | Admitting: *Deleted

## 2016-12-05 DIAGNOSIS — I10 Essential (primary) hypertension: Secondary | ICD-10-CM

## 2016-12-05 MED ORDER — LISINOPRIL 20 MG PO TABS: ORAL_TABLET | ORAL | 2 refills | Status: DC

## 2016-12-07 ENCOUNTER — Encounter: Payer: Self-pay | Admitting: Physician Assistant

## 2016-12-07 ENCOUNTER — Ambulatory Visit (INDEPENDENT_AMBULATORY_CARE_PROVIDER_SITE_OTHER): Payer: Medicare Other | Admitting: Physician Assistant

## 2016-12-07 VITALS — BP 153/98 | HR 76 | Temp 97.0°F | Ht 70.0 in | Wt 204.0 lb

## 2016-12-07 DIAGNOSIS — H60331 Swimmer's ear, right ear: Secondary | ICD-10-CM

## 2016-12-07 MED ORDER — NEOMYCIN-POLYMYXIN-HC 3.5-10000-1 OT SOLN: 3.0000 [drp] | Freq: Four times a day (QID) | OTIC | 0 refills | Status: DC

## 2016-12-07 NOTE — Progress Notes (Signed)
BP (!) 153/98   Pulse 76   Temp 97 F (36.1 C) (Oral)   Ht '5\' 10"'$  (1.778 m)   Wt 204 lb (92.5 kg)   BMI 29.27 kg/m    Subjective:    Patient ID: Randall Sullivan, male    DOB: Apr 27, 1951, 66 y.o.   MRN: [id number]  HPI: Randall Sullivan is a 66 y.o. male presenting on 12/07/2016 for sinus pressure and Otalgia (right x 2 weeks)  Increasing pain in the right ear with some post nasal drip. He has taken allergy medication Allegra, and uses nasal spray Flonase. Still not getting full relief of the right ear. Denies dizziness, nausea, vomiting or diarrhea.  Relevant past medical, surgical, family and social history reviewed and updated as indicated. Allergies and medications reviewed and updated.  Past Medical History:  Diagnosis Date  . Arthritis    back & legs ( knees)  . Complication of anesthesia    agitated upon waking from anesth.   . Emphysema   . Hypertension   . Tobacco abuse     Past Surgical History:  Procedure Laterality Date  . HERNIA REPAIR Bilateral 5277   umbilical  . VIDEO ASSISTED THORACOSCOPY (VATS)/ LOBECTOMY Left 08/01/2016   Procedure: VIDEO ASSISTED THORACOSCOPY (VATS)/ LEFT UPPER LOBECTOMY with lymph node sampling and onQ placement;  Surgeon: [name], MD;  Location: [place];  Service: Thoracic;  Laterality: Left;  [name] VIDEO BRONCHOSCOPY WITH ENDOBRONCHIAL NAVIGATION N/A 07/03/2016   Procedure: VIDEO BRONCHOSCOPY WITH ENDOBRONCHIAL NAVIGATION;  Surgeon: [name], MD;  Location: [place];  Service: Thoracic;  Laterality: N/A;    Review of Systems  Constitutional: Negative.  Negative for appetite change and fatigue.  HENT: Positive for congestion, ear pain and postnasal drip. Negative for ear discharge and facial swelling.   Eyes: Negative for pain and visual disturbance.  Respiratory: Negative.  Negative for cough, chest tightness, shortness of breath and wheezing.   Cardiovascular: Negative.  Negative for chest pain, palpitations and leg swelling.    Gastrointestinal: Negative.  Negative for abdominal pain, diarrhea, nausea and vomiting.  Genitourinary: Negative.   Skin: Negative.  Negative for color change and rash.  Neurological: Negative.  Negative for weakness, numbness and headaches.  Psychiatric/Behavioral: Negative.     Allergies as of 12/07/2016      Reactions   No Known Allergies       Medication List       Accurate as of 12/07/16 11:24 AM. Always use your most recent med list.          acetaminophen 500 MG tablet Commonly known as:  TYLENOL Take 1,000 mg by mouth every 6 (six) hours as needed (for arthritis pain.).   albuterol 108 (90 Base) MCG/ACT inhaler Commonly known as:  PROVENTIL HFA;VENTOLIN HFA Inhale 2 puffs into the lungs every 6 (six) hours as needed for wheezing or shortness of breath.   albuterol (2.5 MG/3ML) 0.083% nebulizer solution Commonly known as:  PROVENTIL Take 3 mLs (2.5 mg total) by nebulization every 6 (six) hours as needed for wheezing or shortness of breath.   albuterol (2.5 MG/3ML) 0.083% nebulizer solution Commonly known as:  PROVENTIL USE 1 VIAL PER NEBULIZATION FOUR TIMES DAILY AS NEEDED   fexofenadine 180 MG tablet Commonly known as:  ALLEGRA Take 180 mg by mouth daily as needed for allergies or rhinitis.   fluticasone 50 MCG/ACT nasal spray Commonly known as:  FLONASE Place into both nostrils daily.   fluticasone furoate-vilanterol 100-25 MCG/INH Aepb  Commonly known as:  BREO ELLIPTA Inhale 1 puff into the lungs daily.   guaiFENesin 600 MG 12 hr tablet Commonly known as:  MUCINEX Take 2 tablets (1,200 mg total) by mouth 2 (two) times daily as needed. For cough   lisinopril 20 MG tablet Commonly known as:  PRINIVIL,ZESTRIL TAKE 1 TABLET (20 MG TOTAL) BY MOUTH DAILY.   neomycin-polymyxin-hydrocortisone otic solution Commonly known as:  CORTISPORIN Place 3 drops into the right ear 4 (four) times daily.   warfarin 5 MG tablet Commonly known as:  COUMADIN Take 1  tablet (5 mg total) by mouth daily.          Objective:    BP (!) 153/98   Pulse 76   Temp 97 F (36.1 C) (Oral)   Ht '5\' 10"'$  (1.778 m)   Wt 204 lb (92.5 kg)   BMI 29.27 kg/m   Allergies  Allergen Reactions  . No Known Allergies     Physical Exam  Constitutional: He appears well-developed and well-nourished. No distress.  HENT:  Head: Normocephalic and atraumatic.  Right Ear: Hearing and tympanic membrane normal. There is tenderness. No middle ear effusion.  Left Ear: Hearing, tympanic membrane, external ear and ear canal normal.  Canal with slight erythema and swelling, no pus.  Eyes: Conjunctivae and EOM are normal. Pupils are equal, round, and reactive to light.  Cardiovascular: Normal rate, regular rhythm and normal heart sounds.   Pulmonary/Chest: Effort normal and breath sounds normal. No respiratory distress.  Skin: Skin is warm and dry.  Psychiatric: He has a normal mood and affect. His behavior is normal.  Nursing note and vitals reviewed.       Assessment & Plan:   1. Acute swimmer's ear of right side - neomycin-polymyxin-hydrocortisone (CORTISPORIN) otic solution; Place 3 drops into the right ear 4 (four) times daily.  Dispense: 10 mL; Refill: 0   Continue all other maintenance medications as listed above.  Follow up plan: Return if symptoms worsen or fail to improve.  Educational handout given for otitis externa  Terald Sleeper PA-C [place] [address]  [place], [place] [address] [phone]   12/07/2016, 11:24 AM

## 2016-12-07 NOTE — Patient Instructions (Signed)
Otitis Externa Otitis externa is an infection of the outer ear canal. The outer ear canal is the area between the outside of the ear and the eardrum. Otitis externa is sometimes called "swimmer's ear." Follow these instructions at home:  If you were given antibiotic ear drops, use them as told by your doctor. Do not stop using them even if your condition gets better.  Take over-the-counter and prescription medicines only as told by your doctor.  Keep all follow-up visits as told by your doctor. This is important. How is this prevented?  Keep your ear dry. Use the corner of a towel to dry your ear after you swim or bathe.  Try not to scratch or put things in your ear. Doing these things makes it easier for germs to grow in your ear.  Avoid swimming in lakes, dirty water, or pools that may not have the right amount of a chemical called chlorine.  Consider making ear drops and putting 3 or 4 drops in each ear after you swim. Ask your doctor about how you can make ear drops. Contact a doctor if:  You have a fever.  After 3 days your ear is still red, swollen, or painful.  After 3 days you still have pus coming from your ear.  Your redness, swelling, or pain gets worse.  You have a really bad headache.  You have redness, swelling, pain, or tenderness behind your ear. This information is not intended to replace advice given to you by your health care provider. Make sure you discuss any questions you have with your health care provider. Document Released: 03/04/2008 Document Revised: 10/12/2015 Document Reviewed: 06/26/2015 Elsevier Interactive Patient Education  2017 [employer].

## 2016-12-07 NOTE — Progress Notes (Signed)
3

## 2016-12-13 ENCOUNTER — Ambulatory Visit: Payer: Medicare Other | Admitting: Pulmonary Disease

## 2016-12-17 ENCOUNTER — Ambulatory Visit (INDEPENDENT_AMBULATORY_CARE_PROVIDER_SITE_OTHER): Payer: Medicare Other | Admitting: *Deleted

## 2016-12-17 DIAGNOSIS — I4891 Unspecified atrial fibrillation: Secondary | ICD-10-CM

## 2016-12-17 DIAGNOSIS — Z5181 Encounter for therapeutic drug level monitoring: Secondary | ICD-10-CM | POA: Diagnosis not present

## 2016-12-17 LAB — POCT INR: INR: 2.6

## 2016-12-22 ENCOUNTER — Other Ambulatory Visit: Payer: Self-pay | Admitting: Family Medicine

## 2016-12-30 ENCOUNTER — Telehealth: Payer: Self-pay | Admitting: Pulmonary Disease

## 2016-12-30 MED ORDER — FLUTICASONE FUROATE-VILANTEROL 100-25 MCG/INH IN AEPB: 1.0000 | INHALATION_SPRAY | Freq: Every day | RESPIRATORY_TRACT | 0 refills | Status: DC

## 2016-12-30 NOTE — Telephone Encounter (Signed)
lmtcb X1 for pt. breo samples left up front for pickup.

## 2016-12-31 ENCOUNTER — Ambulatory Visit (INDEPENDENT_AMBULATORY_CARE_PROVIDER_SITE_OTHER): Payer: Medicare Other | Admitting: Family Medicine

## 2016-12-31 ENCOUNTER — Encounter: Payer: Self-pay | Admitting: Family Medicine

## 2016-12-31 VITALS — BP 161/91 | HR 67 | Temp 97.9°F | Ht 70.0 in | Wt 204.0 lb

## 2016-12-31 DIAGNOSIS — H6501 Acute serous otitis media, right ear: Secondary | ICD-10-CM

## 2016-12-31 DIAGNOSIS — B370 Candidal stomatitis: Secondary | ICD-10-CM | POA: Diagnosis not present

## 2016-12-31 DIAGNOSIS — H8301 Labyrinthitis, right ear: Secondary | ICD-10-CM

## 2016-12-31 MED ORDER — SULFAMETHOXAZOLE-TRIMETHOPRIM 800-160 MG PO TABS: 1.0000 | ORAL_TABLET | Freq: Two times a day (BID) | ORAL | 0 refills | Status: DC

## 2016-12-31 MED ORDER — PSEUDOEPHEDRINE-GUAIFENESIN ER 120-1200 MG PO TB12: 1.0000 | ORAL_TABLET | Freq: Two times a day (BID) | ORAL | 0 refills | Status: DC

## 2016-12-31 MED ORDER — CLOTRIMAZOLE 10 MG MT TROC: OROMUCOSAL | 2 refills | Status: DC

## 2016-12-31 NOTE — Progress Notes (Signed)
Subjective:  Patient ID: Randall Sullivan, male    DOB: 12-Jul-1951  Age: 66 y.o. MRN: [id number]  CC: Ear Pain (pt here today c/o right ear pain and difficulty hearing. He had an episode of being dizzy and thinks it could possibly be vertigo.)   HPI [name] presents for Patient states that a couple of days ago when he was driving the road seemed to be tilted. He's had some diminished hearing in the right ear. Also ringing. There is some pressure as well. He feels off balance at times. No room spinning. Some upper respiratory congestion as well.  History [place] has a past medical history of Arthritis; Complication of anesthesia; Emphysema; Hypertension; and Tobacco abuse.   He has a past surgical history that includes Hernia repair (Bilateral, 1999); Video bronchoscopy with endobronchial navigation (N/A, 07/03/2016); and Video assisted thoracoscopy (vats)/ lobectomy (Left, 08/01/2016).   His family history includes Aneurysm in his sister; Asthma in his brother; Cancer in his mother and sister; Cancer - Lung in his brother; Kidney disease in his paternal aunt.He reports that he quit smoking about 7 months ago. His smoking use included Cigarettes. He has a 40.00 pack-year smoking history. He has never used smokeless tobacco. He reports that he does not drink alcohol or use drugs.  Current Outpatient Prescriptions on File Prior to Visit  Medication Sig Dispense Refill  . acetaminophen (TYLENOL) 500 MG tablet Take 1,000 mg by mouth every 6 (six) hours as needed (for arthritis pain.).     [name] albuterol (PROVENTIL HFA;VENTOLIN HFA) 108 (90 Base) MCG/ACT inhaler Inhale 2 puffs into the lungs every 6 (six) hours as needed for wheezing or shortness of breath. 1 Inhaler 11  . albuterol (PROVENTIL) (2.5 MG/3ML) 0.083% nebulizer solution Take 3 mLs (2.5 mg total) by nebulization every 6 (six) hours as needed for wheezing or shortness of breath. 75 mL 12  . fexofenadine (ALLEGRA) 180 MG tablet Take 180 mg by mouth  daily as needed for allergies or rhinitis.    . fluticasone (FLONASE) 50 MCG/ACT nasal spray Place into both nostrils daily.    . fluticasone furoate-vilanterol (BREO ELLIPTA) 100-25 MCG/INH AEPB Inhale 1 puff into the lungs daily. 2 each 0  . guaiFENesin (MUCINEX) 600 MG 12 hr tablet Take 2 tablets (1,200 mg total) by mouth 2 (two) times daily as needed. For cough    . lisinopril (PRINIVIL,ZESTRIL) 20 MG tablet TAKE 1 TABLET (20 MG TOTAL) BY MOUTH DAILY. 30 tablet 2  . neomycin-polymyxin-hydrocortisone (CORTISPORIN) otic solution Place 3 drops into the right ear 4 (four) times daily. 10 mL 0  . warfarin (COUMADIN) 5 MG tablet Take 1 tablet (5 mg total) by mouth daily. 35 tablet 3   No current facility-administered medications on file prior to visit.     ROS Review of Systems  Constitutional: Negative for fever.  HENT: Positive for congestion and postnasal drip.   Respiratory: Negative for shortness of breath.   Cardiovascular: Negative for chest pain.    Objective:  BP (!) 161/91   Pulse 67   Temp 97.9 F (36.6 C) (Oral)   Ht '5\' 10"'$  (1.778 m)   Wt 204 lb (92.5 kg)   BMI 29.27 kg/m   Physical Exam  Constitutional: He appears well-developed and well-nourished.  HENT:  Head: Normocephalic.  Mouth/Throat: Oropharyngeal exudate (currently at) present.    Assessment & Plan:   Decker was seen today for ear pain.  Diagnoses and all orders for this visit:  Oral candidiasis  Right acute serous otitis media, recurrence not specified  Labyrinthitis of right ear  Other orders -     clotrimazole (MYCELEX) 10 MG troche; Allow one to dissolve in the mouth 5 times daily For yeast -     sulfamethoxazole-trimethoprim (BACTRIM DS,SEPTRA DS) 800-160 MG tablet; Take 1 tablet by mouth 2 (two) times daily. Until gone, for infection -     Pseudoephedrine-Guaifenesin [id number] MG TB12; Take 1 tablet by mouth 2 (two) times daily. For congestion   I am having Mr. Lecomte start on clotrimazole,  sulfamethoxazole-trimethoprim, and Pseudoephedrine-Guaifenesin. I am also having him maintain his acetaminophen, albuterol, guaiFENesin, fexofenadine, albuterol, warfarin, fluticasone, lisinopril, neomycin-polymyxin-hydrocortisone, and fluticasone furoate-vilanterol.  Meds ordered this encounter  Medications  . clotrimazole (MYCELEX) 10 MG troche    Sig: Allow one to dissolve in the mouth 5 times daily For yeast    Dispense:  35 tablet    Refill:  2  . sulfamethoxazole-trimethoprim (BACTRIM DS,SEPTRA DS) 800-160 MG tablet    Sig: Take 1 tablet by mouth 2 (two) times daily. Until gone, for infection    Dispense:  20 tablet    Refill:  0  . Pseudoephedrine-Guaifenesin [id number] MG TB12    Sig: Take 1 tablet by mouth 2 (two) times daily. For congestion    Dispense:  20 each    Refill:  0     Follow-up: Return if symptoms worsen or fail to improve.  Claretta Fraise, M.D.

## 2016-12-31 NOTE — Telephone Encounter (Signed)
Patient wife [name] called back - advised samples are available for pick up - nothing further needed -pr

## 2017-01-14 ENCOUNTER — Ambulatory Visit (INDEPENDENT_AMBULATORY_CARE_PROVIDER_SITE_OTHER): Payer: Medicare Other | Admitting: *Deleted

## 2017-01-14 DIAGNOSIS — I4891 Unspecified atrial fibrillation: Secondary | ICD-10-CM

## 2017-01-14 DIAGNOSIS — Z5181 Encounter for therapeutic drug level monitoring: Secondary | ICD-10-CM | POA: Diagnosis not present

## 2017-01-14 LAB — POCT INR: INR: 3.5

## 2017-01-21 ENCOUNTER — Telehealth: Payer: Self-pay | Admitting: Pulmonary Disease

## 2017-01-21 MED ORDER — FLUTICASONE FUROATE-VILANTEROL 100-25 MCG/INH IN AEPB: 1.0000 | INHALATION_SPRAY | Freq: Every day | RESPIRATORY_TRACT | 0 refills | Status: DC

## 2017-01-21 NOTE — Telephone Encounter (Signed)
1 sample left up front for pick up  Nothing further needed

## 2017-02-05 ENCOUNTER — Telehealth: Payer: Self-pay | Admitting: Pulmonary Disease

## 2017-02-05 MED ORDER — FLUTICASONE FUROATE-VILANTEROL 100-25 MCG/INH IN AEPB: 1.0000 | INHALATION_SPRAY | Freq: Every day | RESPIRATORY_TRACT | 0 refills | Status: AC

## 2017-02-05 NOTE — Telephone Encounter (Signed)
Pt aware of sample of Breo 100  has been placed up front for pickup. Pt states he is currently in the process of applying for patient assistants for [place]. Nothing further needed.

## 2017-02-06 ENCOUNTER — Ambulatory Visit (INDEPENDENT_AMBULATORY_CARE_PROVIDER_SITE_OTHER): Payer: Medicare Other | Admitting: Pulmonary Disease

## 2017-02-06 ENCOUNTER — Encounter: Payer: Self-pay | Admitting: Pulmonary Disease

## 2017-02-06 VITALS — BP 116/70 | Ht 70.0 in | Wt 190.8 lb

## 2017-02-06 DIAGNOSIS — J432 Centrilobular emphysema: Secondary | ICD-10-CM

## 2017-02-06 MED ORDER — FLUTICASONE-SALMETEROL 113-14 MCG/ACT IN AEPB: 1.0000 | INHALATION_SPRAY | Freq: Two times a day (BID) | RESPIRATORY_TRACT | 5 refills | Status: DC

## 2017-02-06 NOTE — Progress Notes (Signed)
Subjective:    Patient ID: Randall Sullivan, male    DOB: [birth year], 66 y.o.   MRN: [id number]  Synopsis: Referred in September 2017 for left upper lobe spiculated mass. Referred to thoracic surgery for biopsy. August 01, 2016 had LUL lobectomy. Pathology revealed well-differentiated adenocarcinoma with negative nodal biopsies. He also has emphysema. Pulmonary function testing September 2017 ratio 33%, FEV1 1.25 L 37% predicted 21% change with bronchodilator, FVC 3.77 L 82% predicted, total lung capacity 9.92 L 141% predicted, residual volume 261% predicted, DLCO 17.0 352% predicted  HPI Chief Complaint  Patient presents with  . Follow-up    f/u adencarcinoma, allergies causing runny nose and head congestion, cant afford Breo, needs new Rx for Nebulizer   Randall Sullivan is having a lot of allergy symptoms.  He has congestion and runny nose.  He says that he was stung by a bee or something a few days ago and he has a lot of swelling in his right chest.  He says that the [facility] really helped with his breathing but he can't afford it.  He hasn't needed albuterol very much since starting the [facility].    Past Medical History:  Diagnosis Date  . Arthritis    back & legs ( knees)  . Complication of anesthesia    agitated upon waking from anesth.   . Emphysema   . Hypertension   . Tobacco abuse       Review of Systems  Constitutional: Negative for chills, fatigue and fever.  HENT: Negative for postnasal drip, rhinorrhea and sinus pain.   Respiratory: Positive for shortness of breath. Negative for choking and wheezing.   Cardiovascular: Negative for chest pain, palpitations and leg swelling.       Objective:   Physical Exam Vitals:   02/06/17 1458 02/06/17 1502  BP:  116/70  Pulse: (P) 81   SpO2: (P) 96%   Weight:  190 lb 12.8 oz (86.5 kg)  Height:  '5\' 10"'$  (1.778 m)   RA  Gen: well appearing HENT: OP clear, TM's clear, neck supple PULM: CTA B, normal percussion CV: RRR, no mgr, trace  edema GI: BS+, soft, nontender Derm: salmon appearing rash R chest wall under axilla, no warmth, tenderness or fluctuance Psyche: normal mood and affect   April primary care records reviewed were he had labyrinthitis of the right ear.     Assessment & Plan:  Emphysema of lung ([place]) This has been a stable interval for [place]. His symptoms from COPD are well controlled on Breo. However, he cannot afford the medicine. We will change him to Airduo 1 puff bid.  f/u 1 year or sooner if needed    Current Outpatient Prescriptions:  .  acetaminophen (TYLENOL) 500 MG tablet, Take 1,000 mg by mouth every 6 (six) hours as needed (for arthritis pain.). , Disp: , Rfl:  .  albuterol (PROVENTIL HFA;VENTOLIN HFA) 108 (90 Base) MCG/ACT inhaler, Inhale 2 puffs into the lungs every 6 (six) hours as needed for wheezing or shortness of breath., Disp: 1 Inhaler, Rfl: 11 .  clotrimazole (MYCELEX) 10 MG troche, Allow one to dissolve in the mouth 5 times daily For yeast, Disp: 35 tablet, Rfl: 2 .  fexofenadine (ALLEGRA) 180 MG tablet, Take 180 mg by mouth daily as needed for allergies or rhinitis., Disp: , Rfl:  .  fluticasone (FLONASE) 50 MCG/ACT nasal spray, Place into both nostrils daily., Disp: , Rfl:  .  fluticasone furoate-vilanterol (BREO ELLIPTA) 100-25 MCG/INH AEPB, Inhale 1 puff into  the lungs daily., Disp: 1 each, Rfl: 0 .  fluticasone furoate-vilanterol (BREO ELLIPTA) 100-25 MCG/INH AEPB, Inhale 1 puff into the lungs daily., Disp: 14 each, Rfl: 0 .  guaiFENesin (MUCINEX) 600 MG 12 hr tablet, Take 2 tablets (1,200 mg total) by mouth 2 (two) times daily as needed. For cough, Disp: , Rfl:  .  lisinopril (PRINIVIL,ZESTRIL) 20 MG tablet, TAKE 1 TABLET (20 MG TOTAL) BY MOUTH DAILY., Disp: 30 tablet, Rfl: 2 .  Pseudoephedrine-Guaifenesin [id number] MG TB12, Take 1 tablet by mouth 2 (two) times daily. For congestion, Disp: 20 each, Rfl: 0 .  warfarin (COUMADIN) 5 MG tablet, Take 1 tablet (5 mg total) by mouth  daily., Disp: 35 tablet, Rfl: 3 .  albuterol (PROVENTIL) (2.5 MG/3ML) 0.083% nebulizer solution, Take 3 mLs (2.5 mg total) by nebulization every 6 (six) hours as needed for wheezing or shortness of breath., Disp: 75 mL, Rfl: 12

## 2017-02-06 NOTE — Patient Instructions (Signed)
Take Airduo 1 puff twice a day no matter how you feel. If this medicine is too expensive as the pharmacists to look at your medication formulary for respiratory medicines and then call me with the list. Use albuterol on an as-needed basis, not regularly. You only need to use this for chest tightness, wheezing, or shortness of breath. Follow-up with [place] in one year or sooner if needed

## 2017-02-06 NOTE — Addendum Note (Signed)
Addended by: Len Blalock on: 02/06/2017 03:34 PM   Modules accepted: Orders

## 2017-02-06 NOTE — Assessment & Plan Note (Signed)
This has been a stable interval for Randall Sullivan. His symptoms from COPD are well controlled on Breo. However, he cannot afford the medicine. We will change him to Airduo 1 puff bid.  f/u 1 year or sooner if needed

## 2017-02-08 ENCOUNTER — Ambulatory Visit (INDEPENDENT_AMBULATORY_CARE_PROVIDER_SITE_OTHER): Payer: Medicare Other | Admitting: Nurse Practitioner

## 2017-02-08 ENCOUNTER — Encounter: Payer: Self-pay | Admitting: Nurse Practitioner

## 2017-02-08 VITALS — BP 131/79 | HR 78 | Temp 97.2°F | Ht 70.0 in | Wt 192.6 lb

## 2017-02-08 DIAGNOSIS — W57XXXA Bitten or stung by nonvenomous insect and other nonvenomous arthropods, initial encounter: Secondary | ICD-10-CM | POA: Diagnosis not present

## 2017-02-08 DIAGNOSIS — S2090XA Unspecified superficial injury of unspecified parts of thorax, initial encounter: Secondary | ICD-10-CM

## 2017-02-08 NOTE — Progress Notes (Signed)
   Subjective:    Patient ID: Randall Sullivan, male    DOB: [birth year], 66 y.o.   MRN: [id number]  HPI Patient comes in c/o insect bites on right upper back. He was doing yasrd work Wednesday and felt like bee stings. Are very itchy. denies pain    Review of Systems  Constitutional: Negative.   Respiratory: Negative.   Cardiovascular: Negative.   Genitourinary: Negative.   Neurological: Negative.   Psychiatric/Behavioral: Negative.   All other systems reviewed and are negative.      Objective:   Physical Exam  Constitutional: He is oriented to person, place, and time. He appears well-developed and well-nourished. No distress.  Cardiovascular: Normal rate and regular rhythm.   Pulmonary/Chest: Effort normal and breath sounds normal.  Neurological: He is alert and oriented to person, place, and time.  Skin: Skin is warm.  Erythematous maculopapular lesions around righ post scapula- no signs of infection  Psychiatric: He has a normal mood and affect. His behavior is normal. Judgment and thought content normal.   BP 131/79   Pulse 78   Temp 97.2 F (36.2 C) (Oral)   Ht '5\' 10"'$  (1.778 m)   Wt 192 lb 9.6 oz (87.4 kg)   BMI 27.64 kg/m         Assessment & Plan:   1. Insect bite, initial encounter    Hydrocortisone or benadryl cream OTC benadrly as needed for itching Avoid scratching RTO prn  Mary-Margaret Hassell Done, FNP

## 2017-02-08 NOTE — Patient Instructions (Signed)
Bee, Wasp, or Hornet Sting  Bees, wasps, and hornets are part of a family of insects that can sting people. These stings can cause pain and inflammation, but they are usually not serious. However, some people may have an allergic reaction to a sting. This can cause the symptoms to be more severe.   SYMPTOMS   Common symptoms of this condition include:   · A red lump in the skin that sometimes has a tiny hole in the center. In some cases, a stinger may be in the center of the wound.  · Pain and itching at the sting site.  · Redness and swelling around the sting site. If you have an allergic reaction (localized allergic reaction), the swelling and redness may spread out from the sting site. In some cases, this reaction can continue to develop over the next 12-36 hours.  In rare cases, a person may have a severe allergic reaction (anaphylactic reaction) to a sting. Symptoms of an anaphylactic reaction may include:   · Wheezing or difficulty breathing.  · Raised, itchy, red patches on the skin.  · Nausea or vomiting.  · Abdominal cramping.  · Diarrhea.  · Chest pain.  · Fainting.  · Redness of the face (flushing).  DIAGNOSIS   This condition is usually diagnosed based on symptoms, medical history, and a physical exam.  TREATMENT   Most stings can be treated with:   · Icing to reduce swelling.  · Medicines (antihistamines) to treat itching or an allergic reaction.  · Medicines to help reduce pain. These may be medicines that you take by mouth, or medicated creams or lotions that you apply to your skin.  If you were stung by a bee, the stinger and a small sac of poison may be in the wound. This may be removed by brushing across it with a flat card, such as a credit card. Another method is to pinch the area and pull it out. These methods can help reduce the severity of the body's reaction to the sting.   HOME CARE INSTRUCTIONS   · Wash the sting site daily with soap and water as told by your health care provider.  · Apply  or take over-the-counter and prescription medicines only as told by your health care provider.  · If directed, apply ice to the sting area.  ?  Put ice in a plastic bag.  ?  Place a towel between your skin and the bag.  ?  Leave the ice on for 20 minutes, 2-3 times per day.  · Do not scratch the sting area.  · To lessen pain, try using a paste that is made of water and baking soda. Rub the paste on the sting area and leave it on for 5 minutes.  · If you had a severe allergic reaction to a sting, you may need:  ?  To wear a medical bracelet or necklace that lists the allergy.  ?  To learn when and how to use an anaphylaxis kit or epinephrine injection. Your family members may also need to learn this.  ?  To carry an anaphylaxis kit with you at all times.  SEEK MEDICAL CARE IF:   · Your symptoms do not get better in 2-3 days.  · You have redness, swelling, or pain that spreads beyond the area of the sting.  · You have a fever.  SEEK IMMEDIATE MEDICAL CARE IF:   You have symptoms of a severe allergic reaction. These include:   · Wheezing or difficulty   breathing.  · Chest pain.  · Light-headedness or fainting.  · Itchy, raised, red patches on the skin.  · Nausea or vomiting.  · Abdominal cramping.  · Diarrhea.  This information is not intended to replace advice given to you by your health care provider. Make sure you discuss any questions you have with your health care provider.  Document Released: 09/16/2005 Document Revised: 06/07/2015 Document Reviewed: 02/01/2015  Elsevier Interactive Patient Education © 2017 [employer]

## 2017-02-10 ENCOUNTER — Telehealth: Payer: Self-pay | Admitting: *Deleted

## 2017-02-10 NOTE — Telephone Encounter (Signed)
Left message for patient to call and reschedule 02/25/17 4:20pm appt with Dr. Percival Spanish

## 2017-02-11 NOTE — Telephone Encounter (Signed)
Left message for patient to call and reschedule 02/25/17 4:20pm appt

## 2017-02-12 ENCOUNTER — Telehealth: Payer: Self-pay | Admitting: Pulmonary Disease

## 2017-02-12 NOTE — Telephone Encounter (Signed)
Spoke with the pt  He states the Airduo is not covered  I instructed him to call his ins for drug formulary list, as previously rec to him in the last AVS  He verbalized understanding and will call once he has the list

## 2017-02-13 ENCOUNTER — Ambulatory Visit (INDEPENDENT_AMBULATORY_CARE_PROVIDER_SITE_OTHER): Payer: Medicare Other | Admitting: *Deleted

## 2017-02-13 ENCOUNTER — Telehealth: Payer: Self-pay | Admitting: Pulmonary Disease

## 2017-02-13 DIAGNOSIS — I4891 Unspecified atrial fibrillation: Secondary | ICD-10-CM

## 2017-02-13 DIAGNOSIS — Z5181 Encounter for therapeutic drug level monitoring: Secondary | ICD-10-CM

## 2017-02-13 LAB — POCT INR: INR: 1.7

## 2017-02-13 NOTE — Telephone Encounter (Signed)
We have no samples of Breo in the office at this time.   Spoke with pt, aware of status of samples.  Nothing further needed at this time.

## 2017-02-14 ENCOUNTER — Telehealth: Payer: Self-pay | Admitting: Pulmonary Disease

## 2017-02-14 NOTE — Telephone Encounter (Signed)
Pt aware that we do not currently have any Breo 100 samples. Nothing further needed.

## 2017-02-17 ENCOUNTER — Telehealth: Payer: Self-pay | Admitting: Pulmonary Disease

## 2017-02-17 ENCOUNTER — Other Ambulatory Visit: Payer: Self-pay | Admitting: Cardiology

## 2017-02-17 MED ORDER — FLUTICASONE FUROATE-VILANTEROL 100-25 MCG/INH IN AEPB: 1.0000 | INHALATION_SPRAY | Freq: Every day | RESPIRATORY_TRACT | 5 refills | Status: DC

## 2017-02-17 NOTE — Telephone Encounter (Signed)
Spoke with pt, aware that we have no breo samples in clinic at this time.  Coupon card left up front for pt pickup, rx sent to preferred pharmacy to be used in conjunction with coupon.  Nothing further needed at this time.

## 2017-02-19 ENCOUNTER — Telehealth: Payer: Self-pay | Admitting: Pulmonary Disease

## 2017-02-19 ENCOUNTER — Telehealth: Payer: Self-pay | Admitting: Family Medicine

## 2017-02-19 MED ORDER — FLUTICASONE-SALMETEROL 113-14 MCG/ACT IN AEPB: 1.0000 | INHALATION_SPRAY | Freq: Two times a day (BID) | RESPIRATORY_TRACT | 5 refills | Status: DC

## 2017-02-19 NOTE — Telephone Encounter (Signed)
Spoke with the pt  He states unable get Breo even with coupon it was too expensive  He has no health ins  Has already been given pt assistance forms  I am unsure what else to do  No samples at this time of [facility]

## 2017-02-19 NOTE — Telephone Encounter (Signed)
If he has no health insurance his best option is generic Airduo 1 puff bid (113 mcg dose)

## 2017-02-19 NOTE — Telephone Encounter (Signed)
Patient returned call, CB is [phone]

## 2017-02-19 NOTE — Telephone Encounter (Signed)
Sample placed at front desk

## 2017-02-19 NOTE — Telephone Encounter (Signed)
Spoke with pt, aware of recs.  rx sent to preferred pharmacy.  Nothing further needed.  

## 2017-02-19 NOTE — Telephone Encounter (Signed)
LMTCB

## 2017-02-25 ENCOUNTER — Ambulatory Visit: Payer: Medicare Other | Admitting: Cardiology

## 2017-03-06 ENCOUNTER — Ambulatory Visit (INDEPENDENT_AMBULATORY_CARE_PROVIDER_SITE_OTHER): Payer: Medicare Other | Admitting: *Deleted

## 2017-03-06 DIAGNOSIS — I4891 Unspecified atrial fibrillation: Secondary | ICD-10-CM

## 2017-03-06 DIAGNOSIS — Z5181 Encounter for therapeutic drug level monitoring: Secondary | ICD-10-CM

## 2017-03-06 LAB — POCT INR: INR: 2.4

## 2017-03-21 ENCOUNTER — Telehealth: Payer: Self-pay | Admitting: Family Medicine

## 2017-03-21 NOTE — Telephone Encounter (Signed)
#  75mple given - breo 100

## 2017-03-27 ENCOUNTER — Encounter: Payer: Self-pay | Admitting: Family Medicine

## 2017-03-27 ENCOUNTER — Ambulatory Visit (INDEPENDENT_AMBULATORY_CARE_PROVIDER_SITE_OTHER): Payer: Medicare Other | Admitting: Family Medicine

## 2017-03-27 VITALS — BP 119/59 | HR 88 | Temp 97.4°F | Ht 70.0 in | Wt 191.0 lb

## 2017-03-27 DIAGNOSIS — J441 Chronic obstructive pulmonary disease with (acute) exacerbation: Secondary | ICD-10-CM

## 2017-03-27 MED ORDER — PREDNISONE 20 MG PO TABS: ORAL_TABLET | ORAL | 0 refills | Status: DC

## 2017-03-27 NOTE — Progress Notes (Signed)
BP (!) 119/59   Pulse 88   Temp 97.4 F (36.3 C) (Oral)   Ht 5\' 10"  (1.778 m)   Wt 191 lb (86.6 kg)   BMI 27.41 kg/m    Subjective:    Patient ID: Randall Sullivan, male    DOB: April 18, 1951, 66 y.o.   MRN: [id number]  HPI: Randall Sullivan is a 66 y.o. male presenting on 03/27/2017 for Sinusitis (sinus congestion, drainage, dizziness) and Cough   HPI Sinus congestion and drainage and dizziness and cough Patient comes in complaining of sinus congestion and drainage and dizziness and cough and some wheezing that is about at his baseline but may be slightly more. He denies any shortness of breath more than normal. He denies any sick contacts that he knows of. He does admit that he has COPD/emphysema but it has been mostly under control and has a pulmonologist that he sees.  Relevant past medical, surgical, family and social history reviewed and updated as indicated. Interim medical history since our last visit reviewed. Allergies and medications reviewed and updated.  Review of Systems  Constitutional: Negative for chills and fever.  HENT: Positive for congestion, postnasal drip, rhinorrhea, sinus pressure, sneezing and sore throat. Negative for ear discharge, ear pain and voice change.   Eyes: Negative for pain, discharge, redness and visual disturbance.  Respiratory: Positive for cough and wheezing. Negative for shortness of breath.   Cardiovascular: Negative for chest pain and leg swelling.  Musculoskeletal: Negative for gait problem.  Skin: Negative for rash.  All other systems reviewed and are negative.   Per HPI unless specifically indicated above      Objective:    BP (!) 119/59   Pulse 88   Temp 97.4 F (36.3 C) (Oral)   Ht 5\' 10"  (1.778 m)   Wt 191 lb (86.6 kg)   BMI 27.41 kg/m   Wt Readings from Last 3 Encounters:  03/27/17 191 lb (86.6 kg)  02/08/17 192 lb 9.6 oz (87.4 kg)  02/06/17 190 lb 12.8 oz (86.5 kg)    Physical Exam  Constitutional: He is oriented to person,  place, and time. He appears well-developed and well-nourished. No distress.  HENT:  Right Ear: Tympanic membrane, external ear and ear canal normal.  Left Ear: Tympanic membrane, external ear and ear canal normal.  Nose: Mucosal edema and rhinorrhea present. No sinus tenderness. No epistaxis. Right sinus exhibits no maxillary sinus tenderness and no frontal sinus tenderness. Left sinus exhibits no maxillary sinus tenderness and no frontal sinus tenderness.  Mouth/Throat: Uvula is midline and mucous membranes are normal. Posterior oropharyngeal edema and posterior oropharyngeal erythema present. No oropharyngeal exudate or tonsillar abscesses.  Eyes: Conjunctivae are normal. No scleral icterus.  Neck: Neck supple. No thyromegaly present.  Cardiovascular: Normal rate, regular rhythm, normal heart sounds and intact distal pulses.   No murmur heard. Pulmonary/Chest: Effort normal and breath sounds normal. No respiratory distress. He has no wheezes. He has no rales.  Musculoskeletal: Normal range of motion. He exhibits no edema.  Lymphadenopathy:    He has no cervical adenopathy.  Neurological: He is alert and oriented to person, place, and time. Coordination normal.  Skin: Skin is warm and dry. No rash noted. He is not diaphoretic.  Psychiatric: He has a normal mood and affect. His behavior is normal.  Nursing note and vitals reviewed.     Assessment & Plan:   Problem List Items Addressed This Visit    None    Visit Diagnoses  COPD exacerbation ([place])    -  Primary   Mild, mostly upper and chest congestion. Recommended for patient to not use that extra one and a half Coumadin that he does once a week, has INR July 3   Relevant Medications   fluticasone furoate-vilanterol (BREO ELLIPTA) 100-25 MCG/INH AEPB   predniSONE (DELTASONE) 20 MG tablet       Follow up plan: Return if symptoms worsen or fail to improve.  Counseling provided for all of the vaccine components No orders of the  defined types were placed in this encounter.   Caryl Pina, MD [facility] 03/27/2017, 3:37 PM

## 2017-03-31 ENCOUNTER — Ambulatory Visit: Payer: Medicare Other | Admitting: Cardiology

## 2017-04-07 ENCOUNTER — Telehealth: Payer: Self-pay | Admitting: Pulmonary Disease

## 2017-04-07 ENCOUNTER — Telehealth: Payer: Self-pay | Admitting: Family Medicine

## 2017-04-07 NOTE — Telephone Encounter (Signed)
lmtcb x1 for pt's wife [name].

## 2017-04-07 NOTE — Telephone Encounter (Signed)
Pt aware at this time we do not have Breo samples

## 2017-04-08 MED ORDER — FLUTICASONE FUROATE-VILANTEROL 100-25 MCG/INH IN AEPB: 1.0000 | INHALATION_SPRAY | Freq: Every day | RESPIRATORY_TRACT | 0 refills | Status: DC

## 2017-04-08 NOTE — Telephone Encounter (Signed)
Spoke with patient. He asked for samples of Breo 100. Advised him that 2 samples had been placed up front for him and reminded him to bring his ID. Patient verbalized understanding. Nothing further needed at time of call.

## 2017-04-08 NOTE — Telephone Encounter (Signed)
LM for patient x 1 at number listed below

## 2017-04-08 NOTE — Telephone Encounter (Signed)
lmtcb x2 for pt's wife [name].

## 2017-04-08 NOTE — Telephone Encounter (Signed)
Pt returned phone call., best to call back after 3pm at [phone].Marland Kitchenert

## 2017-04-08 NOTE — Telephone Encounter (Signed)
Patient's wife is returning phone call. Wife stated she is at work. She stated to call husband [phone].

## 2017-04-08 NOTE — Telephone Encounter (Signed)
Wife returned phone call, ok to leave a message on [phone].Marland Kitchenert

## 2017-04-09 ENCOUNTER — Other Ambulatory Visit (HOSPITAL_BASED_OUTPATIENT_CLINIC_OR_DEPARTMENT_OTHER): Payer: Medicare Other

## 2017-04-09 ENCOUNTER — Ambulatory Visit (HOSPITAL_COMMUNITY)
Admission: RE | Admit: 2017-04-09 | Discharge: 2017-04-09 | Disposition: A | Discharge status: Home / Self Care | Payer: Medicare Other | Source: Ambulatory Visit | Attending: Internal Medicine | Admitting: Internal Medicine

## 2017-04-09 DIAGNOSIS — F17210 Nicotine dependence, cigarettes, uncomplicated: Secondary | ICD-10-CM

## 2017-04-09 DIAGNOSIS — R079 Chest pain, unspecified: Secondary | ICD-10-CM | POA: Diagnosis not present

## 2017-04-09 DIAGNOSIS — I10 Essential (primary) hypertension: Secondary | ICD-10-CM

## 2017-04-09 DIAGNOSIS — Z902 Acquired absence of lung [part of]: Secondary | ICD-10-CM | POA: Diagnosis not present

## 2017-04-09 DIAGNOSIS — I700 Atherosclerosis of aorta: Secondary | ICD-10-CM | POA: Diagnosis not present

## 2017-04-09 DIAGNOSIS — C3412 Malignant neoplasm of upper lobe, left bronchus or lung: Secondary | ICD-10-CM | POA: Diagnosis not present

## 2017-04-09 DIAGNOSIS — C3490 Malignant neoplasm of unspecified part of unspecified bronchus or lung: Secondary | ICD-10-CM | POA: Diagnosis not present

## 2017-04-09 DIAGNOSIS — J439 Emphysema, unspecified: Secondary | ICD-10-CM | POA: Diagnosis not present

## 2017-04-09 LAB — COMPREHENSIVE METABOLIC PANEL
ALT: 9 U/L (ref 0–55)
ANION GAP: 10 meq/L (ref 3–11)
AST: 13 U/L (ref 5–34)
Albumin: 3.6 g/dL (ref 3.5–5.0)
Alkaline Phosphatase: 58 U/L (ref 40–150)
BUN: 23.9 mg/dL (ref 7.0–26.0)
CHLORIDE: 99 meq/L (ref 98–109)
CO2: 27 meq/L (ref 22–29)
CREATININE: 1.4 mg/dL — AB (ref 0.7–1.3)
Calcium: 9.6 mg/dL (ref 8.4–10.4)
EGFR: 50 mL/min/{1.73_m2} — ABNORMAL LOW (ref >90)
Glucose: 103 mg/dl (ref 70–140)
POTASSIUM: 4.6 meq/L (ref 3.5–5.1)
Sodium: 136 mEq/L (ref 136–145)
Total Bilirubin: 1.01 mg/dL (ref 0.20–1.20)
Total Protein: 7.8 g/dL (ref 6.4–8.3)

## 2017-04-09 LAB — CBC WITH DIFFERENTIAL/PLATELET
BASO%: 0.1 % (ref 0.0–2.0)
Basophils Absolute: 0 10*3/uL (ref 0.0–0.1)
EOS%: 0.4 % (ref 0.0–7.0)
Eosinophils Absolute: 0.1 10*3/uL (ref 0.0–0.5)
HCT: 42.4 % (ref 38.4–49.9)
HGB: 13.5 g/dL (ref 13.0–17.1)
LYMPH%: 5.9 % — ABNORMAL LOW (ref 14.0–49.0)
MCH: 27 pg — ABNORMAL LOW (ref 27.2–33.4)
MCHC: 31.8 g/dL — AB (ref 32.0–36.0)
MCV: 84.8 fL (ref 79.3–98.0)
MONO#: 1.9 10*3/uL — ABNORMAL HIGH (ref 0.1–0.9)
MONO%: 11.3 % (ref 0.0–14.0)
NEUT#: 13.9 10*3/uL — ABNORMAL HIGH (ref 1.5–6.5)
NEUT%: 82.3 % — AB (ref 39.0–75.0)
Platelets: 239 10*3/uL (ref 140–400)
RBC: 5 10*6/uL (ref 4.20–5.82)
RDW: 15.4 % — ABNORMAL HIGH (ref 11.0–14.6)
WBC: 16.9 10*3/uL — ABNORMAL HIGH (ref 4.0–10.3)
lymph#: 1 10*3/uL (ref 0.9–3.3)

## 2017-04-09 MED ORDER — IOPAMIDOL (ISOVUE-300) INJECTION 61%
INTRAVENOUS | Status: AC
  Administered 2017-04-09: 75 mL
  Filled 2017-04-09: qty 75

## 2017-04-10 ENCOUNTER — Other Ambulatory Visit: Payer: Self-pay | Admitting: Pediatrics

## 2017-04-10 DIAGNOSIS — I10 Essential (primary) hypertension: Secondary | ICD-10-CM

## 2017-04-15 ENCOUNTER — Ambulatory Visit (INDEPENDENT_AMBULATORY_CARE_PROVIDER_SITE_OTHER): Payer: Medicare Other | Admitting: *Deleted

## 2017-04-15 DIAGNOSIS — Z5181 Encounter for therapeutic drug level monitoring: Secondary | ICD-10-CM | POA: Diagnosis not present

## 2017-04-15 DIAGNOSIS — I4891 Unspecified atrial fibrillation: Secondary | ICD-10-CM

## 2017-04-15 LAB — POCT INR: INR: 1.9

## 2017-04-16 ENCOUNTER — Encounter: Payer: Self-pay | Admitting: Internal Medicine

## 2017-04-16 ENCOUNTER — Ambulatory Visit (HOSPITAL_BASED_OUTPATIENT_CLINIC_OR_DEPARTMENT_OTHER): Payer: Medicare Other | Admitting: Internal Medicine

## 2017-04-16 VITALS — BP 126/69 | HR 79 | Temp 98.5°F | Resp 18 | Ht 70.0 in | Wt 187.4 lb

## 2017-04-16 DIAGNOSIS — N289 Disorder of kidney and ureter, unspecified: Secondary | ICD-10-CM | POA: Diagnosis not present

## 2017-04-16 DIAGNOSIS — C3492 Malignant neoplasm of unspecified part of left bronchus or lung: Secondary | ICD-10-CM

## 2017-04-16 DIAGNOSIS — D72829 Elevated white blood cell count, unspecified: Secondary | ICD-10-CM

## 2017-04-16 DIAGNOSIS — C3412 Malignant neoplasm of upper lobe, left bronchus or lung: Secondary | ICD-10-CM

## 2017-04-16 NOTE — Progress Notes (Signed)
[place] [phone]   Fax:([phone]) [place], MD [place] [address]  DIAGNOSIS: Stage IA (T1b, N0, M0) non-small cell lung cancer, well-differentiated adenocarcinoma presented with left upper lobeS pulmonary nodule diagnosed in September 2017.  PRIOR THERAPY: Status post left VATS, left upper lobectomy with mediastinal lymph node dissection under the care of Dr. Roxan Hockey on 08/01/2016.  CURRENT THERAPY: Observation.  INTERVAL HISTORY: Randall Sullivan 66 y.o. male returns to the clinic today for follow-up visit accompanied by his wife. The patient is feeling fine today with no specific complaints. He was seen recently by his primary care physician for upper respiratory symptoms and he was treated with a course of prednisone and feeling much better. He denied having any current chest pain, shortness of breath, cough or hemoptysis. He denied having any weight loss or night sweats. He has no nausea, vomiting, diarrhea or constipation. He had repeat CT scan of the chest performed recently and he is here for evaluation and discussion of the scan results.  MEDICAL HISTORY: Past Medical History:  Diagnosis Date  . Arthritis    back & legs ( knees)  . Complication of anesthesia    agitated upon waking from anesth.   . Emphysema   . Hypertension   . Tobacco abuse     ALLERGIES:  is allergic to no known allergies.  MEDICATIONS:  Current Outpatient Prescriptions  Medication Sig Dispense Refill  . acetaminophen (TYLENOL) 500 MG tablet Take 1,000 mg by mouth every 6 (six) hours as needed (for arthritis pain.).     [name] fexofenadine (ALLEGRA) 180 MG tablet Take 180 mg by mouth daily as needed for allergies or rhinitis.    . fluticasone (FLONASE) 50 MCG/ACT nasal spray Place into both nostrils daily.    . fluticasone furoate-vilanterol (BREO ELLIPTA) 100-25 MCG/INH AEPB Inhale 1 puff into the lungs daily.    .  fluticasone furoate-vilanterol (BREO ELLIPTA) 100-25 MCG/INH AEPB Inhale 1 puff into the lungs daily. 2 each 0  . Fluticasone-Salmeterol (AIRDUO RESPICLICK 735/32) 992-42 MCG/ACT AEPB Inhale 1 puff into the lungs 2 (two) times daily. 1 each 5  . guaiFENesin (MUCINEX) 600 MG 12 hr tablet Take 2 tablets (1,200 mg total) by mouth 2 (two) times daily as needed. For cough    . lisinopril (PRINIVIL,ZESTRIL) 20 MG tablet TAKE 1 TABLET (20 MG TOTAL) BY MOUTH DAILY. 30 tablet 2  . lisinopril (PRINIVIL,ZESTRIL) 30 MG tablet TAKE 1 TABLET BY MOUTH DAILY. 30 tablet 1  . predniSONE (DELTASONE) 20 MG tablet 2 po at same time daily for 5 days 10 tablet 0  . warfarin (COUMADIN) 5 MG tablet Take 1 and 1/2 tablets every Tuesday and 1 tablet all other days of the week as directed by coumadin clinic 35 tablet 3   No current facility-administered medications for this visit.     SURGICAL HISTORY:  Past Surgical History:  Procedure Laterality Date  . HERNIA REPAIR Bilateral 6834   umbilical  . VIDEO ASSISTED THORACOSCOPY (VATS)/ LOBECTOMY Left 08/01/2016   Procedure: VIDEO ASSISTED THORACOSCOPY (VATS)/ LEFT UPPER LOBECTOMY with lymph node sampling and onQ placement;  Surgeon: [name], MD;  Location: [facility];  Service: Thoracic;  Laterality: Left;  [name] VIDEO BRONCHOSCOPY WITH ENDOBRONCHIAL NAVIGATION N/A 07/03/2016   Procedure: VIDEO BRONCHOSCOPY WITH ENDOBRONCHIAL NAVIGATION;  Surgeon: [name], MD;  Location: [facility];  Service: Thoracic;  Laterality: N/A;    REVIEW  OF SYSTEMS:  A comprehensive review of systems was negative.   PHYSICAL EXAMINATION: General appearance: alert, cooperative and no distress Head: Normocephalic, without obvious abnormality, atraumatic Neck: no adenopathy, no JVD, supple, symmetrical, trachea midline and thyroid not enlarged, symmetric, no tenderness/mass/nodules Lymph nodes: Cervical, supraclavicular, and axillary nodes normal. Resp: clear to auscultation  bilaterally Back: symmetric, no curvature. ROM normal. No CVA tenderness. Cardio: regular rate and rhythm, S1, S2 normal, no murmur, click, rub or gallop GI: soft, non-tender; bowel sounds normal; no masses,  no organomegaly Extremities: extremities normal, atraumatic, no cyanosis or edema  ECOG PERFORMANCE STATUS: 1 - Symptomatic but completely ambulatory  Blood pressure 126/69, pulse 79, temperature 98.5 F (36.9 C), temperature source Oral, resp. rate 18, height 5\' 10"  (1.778 m), weight 187 lb 6.4 oz (85 kg), SpO2 98 %.  LABORATORY DATA: Lab Results  Component Value Date   WBC 16.9 (H) 04/09/2017   HGB 13.5 04/09/2017   HCT 42.4 04/09/2017   MCV 84.8 04/09/2017   PLT 239 04/09/2017      Chemistry      Component Value Date/Time   NA 136 04/09/2017 1249   K 4.6 04/09/2017 1249   CL 97 (L) 08/06/2016 0352   CO2 27 04/09/2017 1249   BUN 23.9 04/09/2017 1249   CREATININE 1.4 (H) 04/09/2017 1249      Component Value Date/Time   CALCIUM 9.6 04/09/2017 1249   ALKPHOS 58 04/09/2017 1249   AST 13 04/09/2017 1249   ALT 9 04/09/2017 1249   BILITOT 1.01 04/09/2017 1249       RADIOGRAPHIC STUDIES: Ct Chest W Contrast  Result Date: 04/09/2017 CLINICAL DATA:  Lung cancer current left chest pain and shortness of breath EXAM: CT CHEST WITH CONTRAST TECHNIQUE: Multidetector CT imaging of the chest was performed during intravenous contrast administration. CONTRAST:  42mL ISOVUE-300 IOPAMIDOL (ISOVUE-300) INJECTION 61% COMPARISON:  01/30/2016 FINDINGS: Cardiovascular: The heart size is normal. No pericardial effusion. Coronary artery calcification is noted. Atherosclerotic calcification is noted in the wall of the thoracic aorta. Mediastinum/Nodes: 8 mm short axis AP window lymph node measured on the prior study is now 10 mm. Previously measured 5 mm subcarinal lymph node is stable at 5 mm. 9 mm short axis right hilar lymph node better seen today given the presence of intravenous contrast  material. There is no axillary lymphadenopathy. The esophagus has normal imaging features. Lungs/Pleura: Marked bullous change again noted in the upper lungs. Emphysema noted. Interval left upper lobectomy. Slight progression of right upper and middle lobe peribronchovascular nodularity with areas of associated airway impaction. Similar but less prominent changes noted medial left lung base. No pulmonary edema or pleural effusion. Upper Abdomen: Unremarkable. Musculoskeletal: Bone windows reveal no worrisome lytic or sclerotic osseous lesions. IMPRESSION: 1. Interval left upper lobectomy. 2. No substantial change and mediastinal lymph nodes. Attention on follow-up recommended. 3. Interval development/progression of peribronchovascular nodularity with associated airway impaction in the right upper lobe and to lesser degrees in the right middle and left lower lobes. Imaging features suggestive of atypical infection. 4.  Aortic Atherosclerois (ICD10-170.0) 5.  Emphysema. ([id number]) Electronically Signed   By: [name] M.D.   On: 04/09/2017 15:03    ASSESSMENT AND PLAN: This is a very pleasant 66 years old white male with a stage IA non-small cell lung cancer status post left lower lobectomy with lymph node dissection under the care of Dr. Roxan Hockey in November 2017 and has been observation since that time. The patient is doing fine  today with no specific complaints. His recent CT scan of the chest showed no evidence for disease recurrence but the patient has evidence for inflammatory process in the right upper lobe and he was recently treated with a course of prednisone but his primary care physician. I discussed the scan results with the patient today. I recommended for him to continue on observation with repeat CT scan of the chest in 6 months. The patient has leukocytosis which likely secondary to the recent treatment with prednisone. He also has mild renal insufficiency and I strongly recommended  for him to increase his oral intake and hydration. He was advised to call immediately if he has any concerning symptoms in the interval. The patient voices understanding of current disease status and treatment options and is in agreement with the current care plan. All questions were answered. The patient knows to call the clinic with any problems, questions or concerns. We can certainly see the patient much sooner if necessary. I spent 10 minutes counseling the patient face to face. The total time spent in the appointment was 15 minutes.  Disclaimer: This note was dictated with voice recognition software. Similar sounding words can inadvertently be transcribed and may not be corrected upon review.

## 2017-04-29 ENCOUNTER — Telehealth: Payer: Self-pay | Admitting: Pulmonary Disease

## 2017-04-29 ENCOUNTER — Telehealth: Payer: Self-pay | Admitting: Family Medicine

## 2017-04-29 MED ORDER — FLUTICASONE FUROATE-VILANTEROL 100-25 MCG/INH IN AEPB: 1.0000 | INHALATION_SPRAY | Freq: Every day | RESPIRATORY_TRACT | 0 refills | Status: DC

## 2017-04-29 NOTE — Telephone Encounter (Signed)
Pt aware sample at front desk ready for pickup

## 2017-04-29 NOTE — Telephone Encounter (Signed)
Called and spoke to pt. Pt is requesting a Breo sample. Advised pt per BQ's recs from May 2018 for pt to try AirDuo because he is without insurance. Pt states the Airduo is still over $100 and he cannot afford this. Advised pt that he was given pt assistance forms several months ago and to complete them and bring them to our office ASAP because he could qualify for free medication. Also informed pt that he can try the GoodRx prescription drug savings card to help with the cost of Airduo as we are out Breo samples. The GoodRx card has been left up front for pick up. Pt verbalized understanding and denied any further questions or concerns at this time.   Will send to BQ as FYI.

## 2017-04-29 NOTE — Telephone Encounter (Signed)
Noted, thanks!

## 2017-05-11 ENCOUNTER — Other Ambulatory Visit: Payer: Self-pay | Admitting: Pediatrics

## 2017-05-16 ENCOUNTER — Telehealth: Payer: Self-pay | Admitting: Family Medicine

## 2017-05-16 NOTE — Telephone Encounter (Signed)
Left message sample ready for pick up

## 2017-05-20 ENCOUNTER — Ambulatory Visit (INDEPENDENT_AMBULATORY_CARE_PROVIDER_SITE_OTHER): Payer: Medicare Other | Admitting: *Deleted

## 2017-05-20 DIAGNOSIS — I4891 Unspecified atrial fibrillation: Secondary | ICD-10-CM | POA: Diagnosis not present

## 2017-05-20 DIAGNOSIS — Z5181 Encounter for therapeutic drug level monitoring: Secondary | ICD-10-CM

## 2017-05-20 LAB — POCT INR: INR: 1.8

## 2017-05-22 NOTE — Progress Notes (Signed)
Cardiology Office Note   Date:  05/25/2017   ID:  Randall Sullivan, DOB [birth year], MRN [id number]  PCP:  Claretta Fraise, MD  Cardiologist:   Minus Breeding, MD    Chief Complaint  Patient presents with  . Atrial Fibrillation      History of Present Illness: Randall Sullivan is a 66 y.o. 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 He underwent left VATS/ left upper lobectomy on 08/02/16. Post up he was noted to be in Afib by telemetry with rates up to 120s. He was started on Amiodarone drip after initial 150 mg bolus with conversion to sinus rhythm and then paroxysms.  He was treated with oral amio and Eliquis.   At the last visit I stopped his amiodarone.  He has had trouble affording the Eliquis.  He switched to warfarin.  Since he was last seen he has had no further symptoms.  The patient denies any new symptoms such as chest discomfort, neck or arm discomfort. There has been no new shortness of breath, PND or orthopnea. There have been no reported palpitations, presyncope or syncope.  Past Medical History:  Diagnosis Date  . Arthritis    back & legs ( knees)  . Complication of anesthesia    agitated upon waking from anesth.   . Emphysema   . Hypertension   . Tobacco abuse     Past Surgical History:  Procedure Laterality Date  . HERNIA REPAIR Bilateral 1027   umbilical  . VIDEO ASSISTED THORACOSCOPY (VATS)/ LOBECTOMY Left 08/01/2016   Procedure: VIDEO ASSISTED THORACOSCOPY (VATS)/ LEFT UPPER LOBECTOMY with lymph node sampling and onQ placement;  Surgeon: [name], MD;  Location: [place];  Service: Thoracic;  Laterality: Left;  [name] VIDEO BRONCHOSCOPY WITH ENDOBRONCHIAL NAVIGATION N/A 07/03/2016   Procedure: VIDEO BRONCHOSCOPY WITH ENDOBRONCHIAL NAVIGATION;  Surgeon: [name], MD;  Location: [place];  Service: Thoracic;  Laterality: N/A;     Current Outpatient Prescriptions    Medication Sig Dispense Refill  . acetaminophen (TYLENOL) 500 MG tablet Take 1,000 mg by mouth every 6 (six) hours as needed (for arthritis pain.).     [name] albuterol (PROVENTIL) (2.5 MG/3ML) 0.083% nebulizer solution USE 1 VIAL PER NEBULIZATION FOUR TIMES DAILY AS NEEDED  5  . albuterol (PROVENTIL) (2.5 MG/3ML) 0.083% nebulizer solution USE 1 VIAL PER NEBULIZATION FOUR TIMES DAILY AS NEEDED 75 mL 5  . aspirin EC 81 MG tablet Take 81 mg by mouth daily.    . fexofenadine (ALLEGRA) 180 MG tablet Take 180 mg by mouth daily as needed for allergies or rhinitis.    . fluticasone (FLONASE) 50 MCG/ACT nasal spray Place into both nostrils daily.    . fluticasone furoate-vilanterol (BREO ELLIPTA) 100-25 MCG/INH AEPB Inhale 1 puff into the lungs daily. 1 each 0  . Fluticasone-Salmeterol (AIRDUO RESPICLICK 253/66) 440-34 MCG/ACT AEPB Inhale 1 puff into the lungs 2 (two) times daily. 1 each 5  . guaiFENesin (MUCINEX) 600 MG 12 hr tablet Take 2 tablets (1,200 mg total) by mouth 2 (two) times daily as needed. For cough    . lisinopril (PRINIVIL,ZESTRIL) 30 MG tablet TAKE 1 TABLET BY MOUTH DAILY. 30 tablet 1   No current facility-administered medications for this visit.     Allergies:   No known allergies    ROS:  Please see the history of present illness.   Otherwise, review of systems are positive  for  none.   All other systems are reviewed and negative.    PHYSICAL EXAM: VS:  BP 122/70   Pulse 68   Ht 5\' 10"  (1.778 m)   Wt 189 lb (85.7 kg)   BMI 27.12 kg/m  , BMI Body mass index is 27.12 kg/m.  GENERAL:  Well appearing NECK:  No jugular venous distention, waveform within normal limits, carotid upstroke brisk and symmetric, no bruits, no thyromegaly LUNGS:  Clear to auscultation bilaterally CHEST:  Unremarkable HEART:  PMI not displaced or sustained,S1 and S2 within normal limits, no S3, no S4, no clicks, no rubs, no murmurs ABD:  Flat, positive bowel sounds normal in frequency in pitch, no  bruits, no rebound, no guarding, no midline pulsatile mass, no hepatomegaly, no splenomegaly EXT:  2 plus pulses throughout, no edema, no cyanosis no clubbing   EKG:  EKG is ordered today. Sinus rhythm, rate 68, axis within normal limits, intervals within normal limits, no acute ST wave changes.  Recent Labs: 08/01/2016: Magnesium 1.8 08/08/2016: TSH 1.520 04/09/2017: ALT 9; BUN 23.9; Creatinine 1.4; HGB 13.5; Platelets 239; Potassium 4.6; Sodium 136    Lipid Panel No results found for: CHOL, TRIG, HDL, CHOLHDL, VLDL, LDLCALC, LDLDIRECT    Wt Readings from Last 3 Encounters:  05/23/17 189 lb (85.7 kg)  04/16/17 187 lb 6.4 oz (85 kg)  03/27/17 191 lb (86.6 kg)      Other studies Reviewed: Additional studies/ records that were reviewed today include:  None Review of the above records demonstrates:    ASSESSMENT AND PLAN:   ATRIAL FIB:  He has had no further paroxysms.  His atrial fib was at the time of his surgery.  I think that he can stop the warfarin and let me know if he has further palpitations in the future.  He will start 81 mg ASA.  HTN:    The blood pressure is at target.  No change in therapy.     Current medicines are reviewed at length with the patient today.  The patient does not have concerns regarding medicines.  The following changes have been made:  As above.   Labs/ tests ordered today include:    Orders Placed This Encounter  Procedures  . EKG 12-Lead     Disposition:   FU with me as needed.   Signed, Minus Breeding, MD  05/25/2017 8:44 PM    [facility]

## 2017-05-23 ENCOUNTER — Encounter: Payer: Self-pay | Admitting: Cardiology

## 2017-05-23 ENCOUNTER — Ambulatory Visit (INDEPENDENT_AMBULATORY_CARE_PROVIDER_SITE_OTHER): Payer: Medicare Other | Admitting: Cardiology

## 2017-05-23 VITALS — BP 122/70 | HR 68 | Ht 70.0 in | Wt 189.0 lb

## 2017-05-23 DIAGNOSIS — I10 Essential (primary) hypertension: Secondary | ICD-10-CM | POA: Diagnosis not present

## 2017-05-23 DIAGNOSIS — I480 Paroxysmal atrial fibrillation: Secondary | ICD-10-CM

## 2017-05-23 NOTE — Patient Instructions (Signed)
Medication Instructions:  STOP Warfarin START- Aspirin 81 mg daily  If you need a refill on your cardiac medications before your next appointment, please call your pharmacy.  Labwork: None Ordered  Testing/Procedures: None Ordered  Follow-Up: Your physician wants you to follow-up in: As Needed.   Thank you for choosing [facility] at [facility]!!

## 2017-05-25 ENCOUNTER — Encounter: Payer: Self-pay | Admitting: Cardiology

## 2017-05-29 ENCOUNTER — Telehealth: Payer: Self-pay | Admitting: Family Medicine

## 2017-05-29 NOTE — Telephone Encounter (Signed)
Pt aware that we have no samples in that strength at this time

## 2017-06-05 ENCOUNTER — Telehealth: Payer: Self-pay | Admitting: *Deleted

## 2017-06-05 ENCOUNTER — Ambulatory Visit: Payer: Self-pay | Admitting: *Deleted

## 2017-06-05 DIAGNOSIS — I480 Paroxysmal atrial fibrillation: Secondary | ICD-10-CM

## 2017-06-05 DIAGNOSIS — Z5181 Encounter for therapeutic drug level monitoring: Secondary | ICD-10-CM

## 2017-06-05 NOTE — Telephone Encounter (Signed)
Randall Sullivan called stating that Dr. Percival Spanish took him off of coumadin approximately 2 weeks ago. Need to cancel upcoming appointment.

## 2017-06-05 NOTE — Telephone Encounter (Signed)
Noted.  Pt removed from anticoagulation clinic.

## 2017-06-14 ENCOUNTER — Other Ambulatory Visit: Payer: Self-pay | Admitting: Family Medicine

## 2017-06-14 DIAGNOSIS — I10 Essential (primary) hypertension: Secondary | ICD-10-CM

## 2017-06-23 ENCOUNTER — Telehealth: Payer: Self-pay | Admitting: Family Medicine

## 2017-06-23 NOTE — Telephone Encounter (Signed)
Pt aware.

## 2017-07-14 ENCOUNTER — Telehealth: Payer: Self-pay | Admitting: Family Medicine

## 2017-07-14 NOTE — Telephone Encounter (Signed)
2 boxes up front for pt. He is aware

## 2017-07-29 ENCOUNTER — Emergency Department (HOSPITAL_COMMUNITY): Payer: Medicare Other

## 2017-07-29 ENCOUNTER — Encounter (HOSPITAL_COMMUNITY): Payer: Self-pay | Admitting: *Deleted

## 2017-07-29 ENCOUNTER — Emergency Department (HOSPITAL_COMMUNITY)
Admission: EM | Admit: 2017-07-29 | Discharge: 2017-07-29 | Disposition: A | Discharge status: Home / Self Care | Payer: Medicare Other | Attending: Emergency Medicine | Admitting: Emergency Medicine

## 2017-07-29 DIAGNOSIS — Z85118 Personal history of other malignant neoplasm of bronchus and lung: Secondary | ICD-10-CM | POA: Insufficient documentation

## 2017-07-29 DIAGNOSIS — I10 Essential (primary) hypertension: Secondary | ICD-10-CM | POA: Insufficient documentation

## 2017-07-29 DIAGNOSIS — Z7982 Long term (current) use of aspirin: Secondary | ICD-10-CM | POA: Diagnosis not present

## 2017-07-29 DIAGNOSIS — Z79899 Other long term (current) drug therapy: Secondary | ICD-10-CM | POA: Insufficient documentation

## 2017-07-29 DIAGNOSIS — M25532 Pain in left wrist: Secondary | ICD-10-CM | POA: Insufficient documentation

## 2017-07-29 DIAGNOSIS — J439 Emphysema, unspecified: Secondary | ICD-10-CM | POA: Insufficient documentation

## 2017-07-29 DIAGNOSIS — S6992XA Unspecified injury of left wrist, hand and finger(s), initial encounter: Secondary | ICD-10-CM | POA: Diagnosis not present

## 2017-07-29 DIAGNOSIS — F17210 Nicotine dependence, cigarettes, uncomplicated: Secondary | ICD-10-CM | POA: Insufficient documentation

## 2017-07-29 HISTORY — DX: Personal history of other malignant neoplasm of bronchus and lung: Z85.118

## 2017-07-29 MED ORDER — HYDROCODONE-ACETAMINOPHEN 7.5-325 MG PO TABS: 1.0000 | ORAL_TABLET | Freq: Four times a day (QID) | ORAL | 0 refills | Status: DC | PRN

## 2017-07-29 MED ORDER — NAPROXEN 500 MG PO TABS: 500.0000 mg | ORAL_TABLET | Freq: Two times a day (BID) | ORAL | 0 refills | Status: DC

## 2017-07-29 MED ORDER — OXYCODONE-ACETAMINOPHEN 5-325 MG PO TABS
1.0000 | ORAL_TABLET | Freq: Once | ORAL | Status: AC
  Administered 2017-07-29: 1 via ORAL
  Filled 2017-07-29: qty 1

## 2017-07-29 NOTE — ED Triage Notes (Signed)
Pt fell 2 days ago and caught himself with his left wrist. Pt c/o pain to left wrist. Left wrist is swollen.

## 2017-07-29 NOTE — ED Provider Notes (Signed)
[facility] EMERGENCY DEPARTMENT Provider Note   CSN: [id number] Arrival date & time: 07/29/17  1012     History   Chief Complaint Chief Complaint  Patient presents with  . Wrist Pain    HPI Randall Sullivan is a 66 y.o. male.  HPI   Randall Sullivan is a 66 y.o. male who presents to the Emergency Department complaining of left wrist pain and swelling.  Patient states that he was cleaning leaves from his roof and fell while standing on the roof.  He did not fall off the roof.  He did fall onto his left hand.  He noticed immediate swelling and discomfort with any movement of the wrist.  He has applied ice without relief.  He states the pain is worse to the lateral side of his wrist describes as a sharp throbbing pain.  He denies other injuries related to the fall.  He denies numbness or weakness of the fingers or pain extending to the elbow or shoulder.  Past Medical History:  Diagnosis Date  . Arthritis    back & legs ( knees)  . Complication of anesthesia    agitated upon waking from anesth.   . Emphysema   . History of lung cancer   . Hypertension   . Tobacco abuse     Patient Active Problem List   Diagnosis Date Noted  . Primary lung adenocarcinoma ([place]) 10/15/2016  . PAF (paroxysmal atrial fibrillation) ([place]) 09/16/2016  . Encounter for therapeutic drug monitoring 09/16/2016  . S/P lobectomy of lung 08/05/2016  . Cancer of left lung ([place]) 08/01/2016  . Lung nodule 06/11/2016  . Former smoker 06/11/2016  . Emphysema of lung ([place]) 06/11/2016  . Essential (primary) hypertension 10/06/2014    Past Surgical History:  Procedure Laterality Date  . HERNIA REPAIR Bilateral 2355   umbilical  . VIDEO ASSISTED THORACOSCOPY (VATS)/ LOBECTOMY Left 08/01/2016   Procedure: VIDEO ASSISTED THORACOSCOPY (VATS)/ LEFT UPPER LOBECTOMY with lymph node sampling and onQ placement;  Surgeon: [name], MD;  Location: [place];  Service: Thoracic;  Laterality: Left;  [name] VIDEO BRONCHOSCOPY  WITH ENDOBRONCHIAL NAVIGATION N/A 07/03/2016   Procedure: VIDEO BRONCHOSCOPY WITH ENDOBRONCHIAL NAVIGATION;  Surgeon: [name], MD;  Location: [place];  Service: Thoracic;  Laterality: N/A;       Home Medications    Prior to Admission medications   Medication Sig Start Date End Date Taking? Authorizing Provider  acetaminophen (TYLENOL) 500 MG tablet Take 1,000 mg by mouth every 6 (six) hours as needed (for arthritis pain.).     [provider]  albuterol (PROVENTIL) (2.5 MG/3ML) 0.083% nebulizer solution USE 1 VIAL PER NEBULIZATION FOUR TIMES DAILY AS NEEDED 04/09/17   [provider]  albuterol (PROVENTIL) (2.5 MG/3ML) 0.083% nebulizer solution USE 1 VIAL PER NEBULIZATION FOUR TIMES DAILY AS NEEDED 05/12/17   Eustaquio Maize, MD  aspirin EC 81 MG tablet Take 81 mg by mouth daily.    [provider]  fexofenadine (ALLEGRA) 180 MG tablet Take 180 mg by mouth daily as needed for allergies or rhinitis.    [provider]  fluticasone (FLONASE) 50 MCG/ACT nasal spray Place into both nostrils daily.    [provider]  fluticasone furoate-vilanterol (BREO ELLIPTA) 100-25 MCG/INH AEPB Inhale 1 puff into the lungs daily. 04/29/17   Claretta Fraise, MD  Fluticasone-Salmeterol (AIRDUO RESPICLICK 732/20) 254-27 MCG/ACT AEPB Inhale 1 puff into the lungs 2 (two) times daily. 02/19/17   Juanito Doom, MD  guaiFENesin (MUCINEX) 600 MG 12 hr tablet Take 2 tablets (1,200 mg total) by mouth 2 (two) times daily as needed. For cough 08/07/16   Lars Pinks M, PA-C  lisinopril (PRINIVIL,ZESTRIL) 30 MG tablet TAKE 1 TABLET BY MOUTH EVERY DAY 06/16/17   Claretta Fraise, MD    Family History Family History  Problem Relation Age of Onset  . Cancer Mother   . Cancer Sister   . Aneurysm Sister   . Kidney disease Paternal Aunt   . Asthma Brother   . Cancer - Lung Brother     Social History Social History  Substance Use Topics  . Smoking status:  Former Smoker    Packs/day: 1.00    Years: 40.00    Types: Cigarettes    Quit date: 05/28/2016  . Smokeless tobacco: Never Used  . Alcohol use No     Comment: quit- 2016     Allergies   No known allergies   Review of Systems Review of Systems  Constitutional: Negative for chills and fever.  Respiratory: Negative for shortness of breath.   Cardiovascular: Negative for chest pain.  Genitourinary: Negative for difficulty urinating and dysuria.  Musculoskeletal: Positive for arthralgias (left wrist pain) and joint swelling. Negative for back pain and neck pain.  Skin: Negative for color change and wound.  Neurological: Negative for dizziness, syncope, weakness, light-headedness and headaches.  All other systems reviewed and are negative.    Physical Exam Updated Vital Signs BP (!) 146/89   Pulse 81   Temp (!) 97.4 F (36.3 C) (Oral)   Resp 16   Ht 5\' 10"  (1.778 m)   Wt 83.9 kg (185 lb)   SpO2 99%   BMI 26.54 kg/m   Physical Exam  Constitutional: He is oriented to person, place, and time. He appears well-developed and well-nourished. No distress.  HENT:  Head: Normocephalic and atraumatic.  Cardiovascular: Normal rate, regular rhythm and intact distal pulses.   Pulmonary/Chest: Effort normal and breath sounds normal.  Musculoskeletal: He exhibits tenderness. He exhibits no edema.  Tender to palpation of the ulnar aspect of the distal left wrist.  Moderate edema noted that extends to the dorsal hand.  No proximal tenderness.    No bruising or bony deformity.    Neurological: He is alert and oriented to person, place, and time. No sensory deficit. He exhibits normal muscle tone. Coordination normal.  Skin: Skin is warm and dry. Capillary refill takes less than 2 seconds.  Nursing note and vitals reviewed.    ED Treatments / Results  Labs (all labs ordered are listed, but only abnormal results are displayed) Labs Reviewed - No data to display  EKG  EKG  Interpretation None       Radiology Dg Wrist Complete Left  Result Date: 07/29/2017 CLINICAL DATA:  Fall, left wrist pain EXAM: LEFT WRIST - COMPLETE 3+ VIEW COMPARISON:  None. FINDINGS: There is no evidence of fracture or dislocation. There is no evidence of arthropathy or other focal bone abnormality. Soft tissues are unremarkable. IMPRESSION: Negative. Electronically Signed   By: Rolm Baptise M.D.   On: 07/29/2017 11:24    Procedures Procedures (including critical care time)  Medications Ordered in ED Medications  oxyCODONE-acetaminophen (PERCOCET/ROXICET) 5-325 MG per tablet 1 tablet (not administered)     Initial Impression / Assessment and Plan / ED Course  I have reviewed the triage vital signs and the nursing notes.  Pertinent labs & imaging results that were available during my care of  the patient were reviewed by me and considered in my medical decision making (see chart for details).     Patient with left wrist injury after a fall.  Neurovascularly intact.  Exam concerning for ligamentous injury.  Patient appears stable for discharge.  Velcro wrist splint applied by nursing staff.  Pain improved.  He agrees to orthopedic follow-up and prefers to follow-up with orthopedics in the [place].  Final Clinical Impressions(s) / ED Diagnoses   Final diagnoses:  Acute pain of left wrist    New Prescriptions New Prescriptions   No medications on file     Kem Parkinson, PA-C 07/31/17 [place], [place], DO 07/31/17 2140

## 2017-07-29 NOTE — Discharge Instructions (Signed)
Elevate and apply ice packs on and off to your wrist.  Call your orthopedic doctor to arrange a follow-up appointment.

## 2017-08-11 ENCOUNTER — Other Ambulatory Visit: Payer: Self-pay | Admitting: Family Medicine

## 2017-08-11 NOTE — Telephone Encounter (Signed)
Pt aware  - none available

## 2017-08-13 ENCOUNTER — Other Ambulatory Visit: Payer: Self-pay | Admitting: Family Medicine

## 2017-08-13 DIAGNOSIS — I10 Essential (primary) hypertension: Secondary | ICD-10-CM

## 2017-08-15 ENCOUNTER — Telehealth: Payer: Self-pay | Admitting: Family Medicine

## 2017-08-15 NOTE — Telephone Encounter (Signed)
Notified patient that no samples are available. Patient states that he will be out of inhalers tomorrow. Can we give him symbicort instead? Please advise and route to pool B

## 2017-08-15 NOTE — Telephone Encounter (Signed)
Patient aware that samples ready for pick up

## 2017-08-15 NOTE — Telephone Encounter (Signed)
Yes use symbicort for now

## 2017-08-18 ENCOUNTER — Telehealth: Payer: Self-pay | Admitting: Family Medicine

## 2017-08-18 NOTE — Telephone Encounter (Signed)
Instructions given on symbicort use

## 2017-09-01 ENCOUNTER — Encounter (HOSPITAL_COMMUNITY): Payer: Self-pay | Admitting: Cardiology

## 2017-09-01 ENCOUNTER — Emergency Department (HOSPITAL_COMMUNITY): Payer: Medicare Other

## 2017-09-01 ENCOUNTER — Emergency Department (HOSPITAL_COMMUNITY)
Admission: EM | Admit: 2017-09-01 | Discharge: 2017-09-01 | Disposition: A | Discharge status: Home / Self Care | Payer: Medicare Other | Attending: Emergency Medicine | Admitting: Emergency Medicine

## 2017-09-01 DIAGNOSIS — Z87891 Personal history of nicotine dependence: Secondary | ICD-10-CM | POA: Insufficient documentation

## 2017-09-01 DIAGNOSIS — R42 Dizziness and giddiness: Secondary | ICD-10-CM | POA: Diagnosis not present

## 2017-09-01 DIAGNOSIS — J439 Emphysema, unspecified: Secondary | ICD-10-CM | POA: Insufficient documentation

## 2017-09-01 DIAGNOSIS — C3490 Malignant neoplasm of unspecified part of unspecified bronchus or lung: Secondary | ICD-10-CM | POA: Insufficient documentation

## 2017-09-01 DIAGNOSIS — I10 Essential (primary) hypertension: Secondary | ICD-10-CM | POA: Insufficient documentation

## 2017-09-01 LAB — URINALYSIS, ROUTINE W REFLEX MICROSCOPIC
Bilirubin Urine: NEGATIVE
Glucose, UA: NEGATIVE mg/dL
Hgb urine dipstick: NEGATIVE
KETONES UR: NEGATIVE mg/dL
LEUKOCYTES UA: NEGATIVE
NITRITE: NEGATIVE
PROTEIN: NEGATIVE mg/dL
Specific Gravity, Urine: 1.005 (ref 1.005–1.030)
pH: 7 (ref 5.0–8.0)

## 2017-09-01 LAB — CBG MONITORING, ED: GLUCOSE-CAPILLARY: 86 mg/dL (ref 65–99)

## 2017-09-01 LAB — CBC WITH DIFFERENTIAL/PLATELET
BASOS ABS: 0 10*3/uL (ref 0.0–0.1)
BASOS PCT: 0 %
Eosinophils Absolute: 0.1 10*3/uL (ref 0.0–0.7)
Eosinophils Relative: 2 %
HEMATOCRIT: 46.8 % (ref 39.0–52.0)
HEMOGLOBIN: 14.4 g/dL (ref 13.0–17.0)
Lymphocytes Relative: 21 %
Lymphs Abs: 1.4 10*3/uL (ref 0.7–4.0)
MCH: 26.6 pg (ref 26.0–34.0)
MCHC: 30.8 g/dL (ref 30.0–36.0)
MCV: 86.5 fL (ref 78.0–100.0)
Monocytes Absolute: 0.6 10*3/uL (ref 0.1–1.0)
Monocytes Relative: 9 %
NEUTROS ABS: 4.6 10*3/uL (ref 1.7–7.7)
NEUTROS PCT: 68 %
Platelets: 221 10*3/uL (ref 150–400)
RBC: 5.41 MIL/uL (ref 4.22–5.81)
RDW: 14.3 % (ref 11.5–15.5)
WBC: 6.7 10*3/uL (ref 4.0–10.5)

## 2017-09-01 LAB — TROPONIN I

## 2017-09-01 LAB — BASIC METABOLIC PANEL
ANION GAP: 7 (ref 5–15)
BUN: 14 mg/dL (ref 6–20)
CALCIUM: 8.9 mg/dL (ref 8.9–10.3)
CHLORIDE: 100 mmol/L — AB (ref 101–111)
CO2: 29 mmol/L (ref 22–32)
Creatinine, Ser: 0.96 mg/dL (ref 0.61–1.24)
GFR calc non Af Amer: >60 mL/min (ref >60)
Glucose, Bld: 89 mg/dL (ref 65–99)
Potassium: 4.1 mmol/L (ref 3.5–5.1)
Sodium: 136 mmol/L (ref 135–145)

## 2017-09-01 MED ORDER — AMOXICILLIN 500 MG PO CAPS: 500.0000 mg | ORAL_CAPSULE | Freq: Three times a day (TID) | ORAL | 0 refills | Status: DC

## 2017-09-01 MED ORDER — MECLIZINE HCL 25 MG PO TABS: 25.0000 mg | ORAL_TABLET | Freq: Three times a day (TID) | ORAL | 0 refills | Status: DC | PRN

## 2017-09-01 MED ORDER — SODIUM CHLORIDE 0.9 % IV BOLUS (SEPSIS)
1000.0000 mL | Freq: Once | INTRAVENOUS | Status: AC
  Administered 2017-09-01: 1000 mL via INTRAVENOUS

## 2017-09-01 MED ORDER — MECLIZINE HCL 12.5 MG PO TABS
25.0000 mg | ORAL_TABLET | Freq: Once | ORAL | Status: AC
  Administered 2017-09-01: 25 mg via ORAL
  Filled 2017-09-01: qty 2

## 2017-09-01 NOTE — ED Notes (Signed)
EKg given to [place], [place].

## 2017-09-01 NOTE — Discharge Instructions (Signed)
Drink plenty of fluids.  Follow-up with your primary provider later this week for recheck.  Return to the ER for any persistent or worsening symptoms.

## 2017-09-01 NOTE — ED Provider Notes (Signed)
[facility] EMERGENCY DEPARTMENT Provider Note   CSN: [id number] Arrival date & time: 09/01/17  0831     History   Chief Complaint Chief Complaint  Patient presents with  . Dizziness    HPI Randall Sullivan is a 66 y.o. male.  HPI   Randall Sullivan is a 66 y.o. male who presents to the Emergency Department complaining of persistent, intermittent dizziness for 2 weeks.  He describes a sensation of movement that is intermittent and mostly associated with position change.  He also complains of swelling and discomfort of the right ear and a sensation of "fullness."  He was seen by his PCP for this previously and prescribed eardrops and prednisone which he states have not helped his symptoms.  He denies headaches, fever, chills, chest pain, syncope or near syncope, shortness of breath, facial symptoms, nausea or vomiting.   Past Medical History:  Diagnosis Date  . Arthritis    back & legs ( knees)  . Complication of anesthesia    agitated upon waking from anesth.   . Emphysema   . History of lung cancer   . Hypertension   . Tobacco abuse     Patient Active Problem List   Diagnosis Date Noted  . Primary lung adenocarcinoma ([place]) 10/15/2016  . PAF (paroxysmal atrial fibrillation) ([place]) 09/16/2016  . Encounter for therapeutic drug monitoring 09/16/2016  . S/P lobectomy of lung 08/05/2016  . Cancer of left lung ([place]) 08/01/2016  . Lung nodule 06/11/2016  . Former smoker 06/11/2016  . Emphysema of lung ([place]) 06/11/2016  . Essential (primary) hypertension 10/06/2014    Past Surgical History:  Procedure Laterality Date  . HERNIA REPAIR Bilateral 0109   umbilical  . VIDEO ASSISTED THORACOSCOPY (VATS)/ LOBECTOMY Left 08/01/2016   Procedure: VIDEO ASSISTED THORACOSCOPY (VATS)/ LEFT UPPER LOBECTOMY with lymph node sampling and onQ placement;  Surgeon: [name], MD;  Location: [place];  Service: Thoracic;  Laterality: Left;  [name] VIDEO BRONCHOSCOPY WITH ENDOBRONCHIAL NAVIGATION N/A  07/03/2016   Procedure: VIDEO BRONCHOSCOPY WITH ENDOBRONCHIAL NAVIGATION;  Surgeon: [name], MD;  Location: [place];  Service: Thoracic;  Laterality: N/A;       Home Medications    Prior to Admission medications   Medication Sig Start Date End Date Taking? Authorizing Provider  acetaminophen (TYLENOL) 500 MG tablet Take 1,000 mg by mouth every 6 (six) hours as needed (for arthritis pain.).     [provider]  albuterol (PROVENTIL) (2.5 MG/3ML) 0.083% nebulizer solution USE 1 VIAL PER NEBULIZATION FOUR TIMES DAILY AS NEEDED 04/09/17   [provider]  albuterol (PROVENTIL) (2.5 MG/3ML) 0.083% nebulizer solution USE 1 VIAL PER NEBULIZATION FOUR TIMES DAILY AS NEEDED 05/12/17   Eustaquio Maize, MD  aspirin EC 81 MG tablet Take 81 mg by mouth daily.    [provider]  fexofenadine (ALLEGRA) 180 MG tablet Take 180 mg by mouth daily as needed for allergies or rhinitis.    [provider]  fluticasone (FLONASE) 50 MCG/ACT nasal spray Place into both nostrils daily.    [provider]  fluticasone furoate-vilanterol (BREO ELLIPTA) 100-25 MCG/INH AEPB Inhale 1 puff into the lungs daily. 04/29/17   Claretta Fraise, MD  Fluticasone-Salmeterol (AIRDUO RESPICLICK 323/55) 732-20 MCG/ACT AEPB Inhale 1 puff into the lungs 2 (two) times daily. 02/19/17   Juanito Doom, MD  guaiFENesin (MUCINEX) 600 MG 12 hr tablet Take 2 tablets (1,200 mg total) by mouth 2 (two) times daily as needed. For cough  08/07/16   Nani Skillern, PA-C  HYDROcodone-acetaminophen (NORCO) 7.5-325 MG tablet Take 1 tablet by mouth every 6 (six) hours as needed for moderate pain. 07/29/17   Yanelie Abraha, PA-C  lisinopril (PRINIVIL,ZESTRIL) 30 MG tablet TAKE 1 TABLET BY MOUTH EVERY DAY 08/13/17   Claretta Fraise, MD  naproxen (NAPROSYN) 500 MG tablet Take 1 tablet (500 mg total) by mouth 2 (two) times daily with a meal. 07/29/17   Gregor Dershem, PA-C    Family  History Family History  Problem Relation Age of Onset  . Cancer Mother   . Cancer Sister   . Aneurysm Sister   . Kidney disease Paternal Aunt   . Asthma Brother   . Cancer - Lung Brother     Social History Social History   Tobacco Use  . Smoking status: Former Smoker    Packs/day: 1.00    Years: 40.00    Pack years: 40.00    Types: Cigarettes    Last attempt to quit: 05/28/2016    Years since quitting: 1.2  . Smokeless tobacco: Never Used  Substance Use Topics  . Alcohol use: No    Comment: quit- 2016  . Drug use: No     Allergies   No known allergies   Review of Systems Review of Systems  Constitutional: Negative for activity change, appetite change, chills and fever.  HENT: Positive for congestion and ear pain. Negative for facial swelling, sore throat and trouble swallowing.   Eyes: Negative for visual disturbance.  Respiratory: Positive for cough. Negative for shortness of breath and wheezing.   Cardiovascular: Negative for chest pain.  Gastrointestinal: Negative for abdominal pain, nausea and vomiting.  Musculoskeletal: Negative for arthralgias, myalgias, neck pain and neck stiffness.  Neurological: Positive for dizziness. Negative for seizures, syncope, facial asymmetry, speech difficulty, light-headedness and headaches.  Psychiatric/Behavioral: Negative for confusion and decreased concentration.     Physical Exam Updated Vital Signs BP (!) 162/100   Pulse 75   Temp (!) 97.5 F (36.4 C) (Oral)   Resp 16   Ht 5\' 10"  (1.778 m)   Wt 88.9 kg (196 lb)   SpO2 95%   BMI 28.12 kg/m   Physical Exam  Constitutional: He is oriented to person, place, and time. He appears well-developed and well-nourished. No distress.  HENT:  Head: Atraumatic.  Right Ear: No mastoid tenderness. Tympanic membrane is erythematous. Tympanic membrane is not bulging. A middle ear effusion is present.  Left Ear: Tympanic membrane and ear canal normal.  Mouth/Throat: Oropharynx is  clear and moist.  Eyes: Pupils are equal, round, and reactive to light.  Neck: Normal range of motion and phonation normal. Neck supple. No JVD present. No Kernig's sign noted.  Cardiovascular: Normal rate, regular rhythm, normal heart sounds and intact distal pulses.  No murmur heard. Pulmonary/Chest: Effort normal and breath sounds normal. No respiratory distress. He exhibits no tenderness.  Abdominal: Soft. There is no tenderness.  Musculoskeletal: Normal range of motion.  Neurological: He is alert and oriented to person, place, and time. He has normal strength. No sensory deficit. Coordination and gait normal. GCS eye subscore is 4. GCS verbal subscore is 5. GCS motor subscore is 6.  CN II-XII intact.  Patient mentate well.  No facial droop or weakness, no pronator drift, 5 out of 5 motor strength of the bilateral upper and lower extremities  Skin: Skin is warm. Capillary refill takes less than 2 seconds. No rash noted.  Nursing note and vitals reviewed.  ED Treatments / Results  Labs (all labs ordered are listed, but only abnormal results are displayed) Labs Reviewed  URINALYSIS, ROUTINE W REFLEX MICROSCOPIC - Abnormal; Notable for the following components:      Result Value   Color, Urine STRAW (*)    All other components within normal limits  BASIC METABOLIC PANEL - Abnormal; Notable for the following components:   Chloride 100 (*)    All other components within normal limits  CBC WITH DIFFERENTIAL/PLATELET  TROPONIN I  CBG MONITORING, ED    EKG  EKG Interpretation  Date/Time:  Monday September 01 2017 09:33:08 EST Ventricular Rate:  65 PR Interval:    QRS Duration: 96 QT Interval:  406 QTC Calculation: 423 R Axis:   96 Text Interpretation:  Sinus rhythm Right axis deviation No significant change since last tracing Confirmed by Isla Pence [phone]) on 09/01/2017 9:40:24 AM       Radiology Dg Chest 2 View  Result Date: 09/01/2017 CLINICAL DATA:  Chest  congestion.  History of lung cancer. EXAM: CHEST  2 VIEW COMPARISON:  Chest x-ray dated 11/12/2016 and chest CT dated 04/09/2017 FINDINGS: There are no acute infiltrates or effusions. Heart size and pulmonary vascularity are normal. The lungs are hyperinflated consistent with emphysema. Heart and mediastinal structures are shifted to the left consistent with a previous left upper lobectomy. Surgical staples and clips are seen in the left hilum. Aortic atherosclerosis. No acute bone abnormality. IMPRESSION: No acute abnormality. Aortic atherosclerosis. Emphysema. Electronically Signed   By: Lorriane Shire M.D.   On: 09/01/2017 10:00   Ct Head Wo Contrast  Result Date: 09/01/2017 CLINICAL DATA:  Vertigo. Right ear swelling and dizziness for 2-3 months, worse this morning. EXAM: CT HEAD WITHOUT CONTRAST TECHNIQUE: Contiguous axial images were obtained from the base of the skull through the vertex without intravenous contrast. COMPARISON:  None. FINDINGS: Brain: There is no evidence of acute infarct, intracranial hemorrhage, mass, midline shift, or extra-axial fluid collection. Mild generalized cerebral atrophy is within normal limits for age. Vascular: Calcified atherosclerosis at the skullbase. No hyperdense vessel. Skull: No fracture or destructive osseous lesion. Nonspecific subcentimeter lucency in the right parietal bone. Sinuses/Orbits: Visualized paranasal sinuses and mastoid air cells are clear. Orbits are unremarkable. Other: None. IMPRESSION: No evidence of acute intracranial abnormality. Electronically Signed   By: Logan Bores M.D.   On: 09/01/2017 10:04    Procedures Procedures (including critical care time)  Medications Ordered in ED Medications  sodium chloride 0.9 % bolus 1,000 mL (0 mLs Intravenous Stopped 09/01/17 1130)  meclizine (ANTIVERT) tablet 25 mg (25 mg Oral Given 09/01/17 1049)     Initial Impression / Assessment and Plan / ED Course  I have reviewed the triage vital signs and  the nursing notes.  Pertinent labs & imaging results that were available during my care of the patient were reviewed by me and considered in my medical decision making (see chart for details).     Orthostatic VS for the past 24 hrs (Last 3 readings):  BP- Lying Pulse- Lying BP- Sitting Pulse- Sitting BP- Standing at 0 minutes Pulse- Standing at 0 minutes  09/01/17 0928 130/90 70 146/83 69 136/88 73     On recheck, patient is feeling better, he is ambulated to the restroom gait steady.  No focal neuro deficits on exam.  Negative Romberg.  Labs and CT are reassuring.  Patient is feeling better.  Likely vertigo.  Will treat with meclizine and antibiotic.  Patient agrees  to close follow-up with PCP this week.  Return precautions discussed.  Final Clinical Impressions(s) / ED Diagnoses   Final diagnoses:  Vertigo    ED Discharge Orders    None       Kem Parkinson, PA-C 09/02/17 [place], Julie, MD 09/02/17 1544

## 2017-09-01 NOTE — ED Triage Notes (Signed)
Right ear swelling and dizziness for a while.  Worse this morning.  Also c/o chest congestion.

## 2017-09-09 ENCOUNTER — Encounter: Payer: Medicare Other | Admitting: Thoracic Surgery (Cardiothoracic Vascular Surgery)

## 2017-09-13 ENCOUNTER — Other Ambulatory Visit: Payer: Self-pay | Admitting: Family Medicine

## 2017-09-13 DIAGNOSIS — I10 Essential (primary) hypertension: Secondary | ICD-10-CM

## 2017-09-29 ENCOUNTER — Encounter: Payer: Medicare Other | Admitting: Thoracic Surgery (Cardiothoracic Vascular Surgery)

## 2017-10-01 ENCOUNTER — Telehealth: Payer: Self-pay | Admitting: Family Medicine

## 2017-10-01 NOTE — Telephone Encounter (Signed)
Patient given sample of Breo.   Requesting refills on Breo also. Please advise.

## 2017-10-01 NOTE — Telephone Encounter (Signed)
Patient states that he only wants the samples not a rx sent in. Patient aware samples up front.

## 2017-10-01 NOTE — Telephone Encounter (Signed)
Okay to refill all meds for 6 mos 

## 2017-10-10 ENCOUNTER — Telehealth: Payer: Self-pay | Admitting: Internal Medicine

## 2017-10-10 NOTE — Telephone Encounter (Signed)
Patient called in to reschedule  °

## 2017-10-13 ENCOUNTER — Other Ambulatory Visit: Payer: Self-pay | Admitting: Family Medicine

## 2017-10-13 DIAGNOSIS — I10 Essential (primary) hypertension: Secondary | ICD-10-CM

## 2017-10-14 ENCOUNTER — Other Ambulatory Visit: Payer: Medicare Other

## 2017-10-14 ENCOUNTER — Ambulatory Visit: Payer: Medicare Other | Admitting: Internal Medicine

## 2017-10-16 ENCOUNTER — Telehealth: Payer: Self-pay | Admitting: Medical Oncology

## 2017-10-16 NOTE — Telephone Encounter (Addendum)
Wife called to cancel appt /scan this month due to transportation. She requested to r/s in march same day as Dr Roxan Hockey. Schedule request sent,

## 2017-10-16 NOTE — Telephone Encounter (Signed)
I left message that Central scheduling has not heard back from pt about scheduling scan. Please return call.

## 2017-10-21 ENCOUNTER — Ambulatory Visit: Payer: Medicare Other | Admitting: Internal Medicine

## 2017-10-21 ENCOUNTER — Other Ambulatory Visit: Payer: Medicare Other

## 2017-10-23 ENCOUNTER — Telehealth: Payer: Self-pay | Admitting: Internal Medicine

## 2017-10-23 NOTE — Telephone Encounter (Signed)
Scheduled appt per 1/17 sch message - Patient is aware of appt date and time [facility] radiology to contact patient with ct schedule.

## 2017-11-04 ENCOUNTER — Telehealth: Payer: Self-pay | Admitting: Family Medicine

## 2017-11-04 NOTE — Telephone Encounter (Signed)
Aware.  Samples ready for pick up.

## 2017-11-13 ENCOUNTER — Other Ambulatory Visit: Payer: Self-pay | Admitting: Family Medicine

## 2017-11-13 DIAGNOSIS — I10 Essential (primary) hypertension: Secondary | ICD-10-CM

## 2017-12-01 ENCOUNTER — Telehealth: Payer: Self-pay | Admitting: *Deleted

## 2017-12-01 ENCOUNTER — Telehealth: Payer: Self-pay | Admitting: Family Medicine

## 2017-12-01 NOTE — Telephone Encounter (Signed)
No samples available, patient aware

## 2017-12-01 NOTE — Telephone Encounter (Signed)
"  Received call today about my husband registering for appointments.  Why was he called to register?" Call was from Radiology. Tomorrow he needs to arrive at;  11:15 registration for [facility] for lab appointment  12:45 registration for [facility] Radiology scans.  2:30  Registation for [facility] F/U visit." "Thanks for clearing this up." Denies further questions or needs at this time.

## 2017-12-01 NOTE — Telephone Encounter (Signed)
Oncology Nurse Navigator Documentation  Oncology Nurse Navigator Flowsheets 12/01/2017  Navigator Location [facility]-  Navigator Encounter Type Telephone/I followed up on Randall Sullivan schedule. He had his CT scan after he sees Dr. Julien Nordmann. I called patient to change so he will get his CT before he sees practitioner. He verbalized understanding of appt change.   Telephone Outgoing Call  Treatment Phase Follow-up  Barriers/Navigation Needs Coordination of Care  Interventions Coordination of Care  Coordination of Care Appts  Acuity Level 2  Acuity Level 2 Assistance expediting appointments  Time Spent with Patient 30

## 2017-12-02 ENCOUNTER — Ambulatory Visit (HOSPITAL_COMMUNITY): Payer: Medicare Other

## 2017-12-02 ENCOUNTER — Telehealth: Payer: Self-pay | Admitting: Oncology

## 2017-12-02 ENCOUNTER — Ambulatory Visit: Payer: Medicare Other | Admitting: Oncology

## 2017-12-02 ENCOUNTER — Other Ambulatory Visit: Payer: Medicare Other

## 2017-12-02 ENCOUNTER — Ambulatory Visit: Payer: Medicare Other | Admitting: Internal Medicine

## 2017-12-02 ENCOUNTER — Encounter: Payer: Medicare Other | Admitting: Thoracic Surgery (Cardiothoracic Vascular Surgery)

## 2017-12-02 NOTE — Telephone Encounter (Signed)
Patients wife called in to reschedule he is sick

## 2017-12-04 ENCOUNTER — Other Ambulatory Visit: Payer: Self-pay | Admitting: Family Medicine

## 2017-12-04 NOTE — Telephone Encounter (Signed)
Pt notified no samples available 

## 2017-12-09 ENCOUNTER — Encounter (HOSPITAL_COMMUNITY): Payer: Self-pay

## 2017-12-09 ENCOUNTER — Telehealth: Payer: Self-pay

## 2017-12-09 ENCOUNTER — Inpatient Hospital Stay: Payer: Medicare Other | Attending: Internal Medicine

## 2017-12-09 ENCOUNTER — Other Ambulatory Visit: Payer: Self-pay

## 2017-12-09 ENCOUNTER — Encounter: Payer: Self-pay | Admitting: Oncology

## 2017-12-09 ENCOUNTER — Ambulatory Visit (HOSPITAL_COMMUNITY)
Admission: RE | Admit: 2017-12-09 | Discharge: 2017-12-09 | Disposition: A | Discharge status: Home / Self Care | Payer: Medicare Other | Source: Ambulatory Visit | Attending: Internal Medicine | Admitting: Internal Medicine

## 2017-12-09 ENCOUNTER — Inpatient Hospital Stay (HOSPITAL_BASED_OUTPATIENT_CLINIC_OR_DEPARTMENT_OTHER): Payer: Medicare Other | Admitting: Oncology

## 2017-12-09 VITALS — BP 146/87 | HR 82 | Temp 98.3°F | Resp 18 | Ht 70.0 in | Wt 198.2 lb

## 2017-12-09 DIAGNOSIS — Z79899 Other long term (current) drug therapy: Secondary | ICD-10-CM | POA: Insufficient documentation

## 2017-12-09 DIAGNOSIS — Z7982 Long term (current) use of aspirin: Secondary | ICD-10-CM | POA: Insufficient documentation

## 2017-12-09 DIAGNOSIS — I10 Essential (primary) hypertension: Secondary | ICD-10-CM | POA: Diagnosis not present

## 2017-12-09 DIAGNOSIS — J439 Emphysema, unspecified: Secondary | ICD-10-CM | POA: Diagnosis not present

## 2017-12-09 DIAGNOSIS — C3492 Malignant neoplasm of unspecified part of left bronchus or lung: Secondary | ICD-10-CM | POA: Diagnosis not present

## 2017-12-09 DIAGNOSIS — I700 Atherosclerosis of aorta: Secondary | ICD-10-CM | POA: Insufficient documentation

## 2017-12-09 DIAGNOSIS — M129 Arthropathy, unspecified: Secondary | ICD-10-CM | POA: Insufficient documentation

## 2017-12-09 DIAGNOSIS — I2510 Atherosclerotic heart disease of native coronary artery without angina pectoris: Secondary | ICD-10-CM

## 2017-12-09 DIAGNOSIS — C3412 Malignant neoplasm of upper lobe, left bronchus or lung: Secondary | ICD-10-CM

## 2017-12-09 DIAGNOSIS — F17210 Nicotine dependence, cigarettes, uncomplicated: Secondary | ICD-10-CM | POA: Insufficient documentation

## 2017-12-09 DIAGNOSIS — R911 Solitary pulmonary nodule: Secondary | ICD-10-CM | POA: Diagnosis not present

## 2017-12-09 LAB — CBC WITH DIFFERENTIAL/PLATELET
BASOS ABS: 0.1 10*3/uL (ref 0.0–0.1)
BASOS PCT: 1 %
EOS ABS: 0.2 10*3/uL (ref 0.0–0.5)
Eosinophils Relative: 2 %
HCT: 41.6 % (ref 38.4–49.9)
HEMOGLOBIN: 13.6 g/dL (ref 13.0–17.1)
Lymphocytes Relative: 15 %
Lymphs Abs: 1.3 10*3/uL (ref 0.9–3.3)
MCH: 26.8 pg — ABNORMAL LOW (ref 27.2–33.4)
MCHC: 32.6 g/dL (ref 32.0–36.0)
MCV: 82 fL (ref 79.3–98.0)
Monocytes Absolute: 0.7 10*3/uL (ref 0.1–0.9)
Monocytes Relative: 8 %
NEUTROS ABS: 6.5 10*3/uL (ref 1.5–6.5)
NEUTROS PCT: 74 %
Platelets: 269 10*3/uL (ref 140–400)
RBC: 5.07 MIL/uL (ref 4.20–5.82)
RDW: 15.3 % — ABNORMAL HIGH (ref 11.0–14.6)
WBC: 8.8 10*3/uL (ref 4.0–10.3)

## 2017-12-09 LAB — COMPREHENSIVE METABOLIC PANEL
ALBUMIN: 3.6 g/dL (ref 3.5–5.0)
ALK PHOS: 64 U/L (ref 40–150)
ALT: 10 U/L (ref 0–55)
ANION GAP: 7 (ref 3–11)
AST: 13 U/L (ref 5–34)
BUN: 8 mg/dL (ref 7–26)
CALCIUM: 9.9 mg/dL (ref 8.4–10.4)
CO2: 28 mmol/L (ref 22–29)
CREATININE: 0.95 mg/dL (ref 0.70–1.30)
Chloride: 102 mmol/L (ref 98–109)
GFR calc Af Amer: >60 mL/min (ref >60)
GFR calc non Af Amer: >60 mL/min (ref >60)
GLUCOSE: 91 mg/dL (ref 70–140)
Potassium: 5.1 mmol/L (ref 3.5–5.1)
SODIUM: 137 mmol/L (ref 136–145)
Total Bilirubin: 0.6 mg/dL (ref 0.2–1.2)
Total Protein: 7.7 g/dL (ref 6.4–8.3)

## 2017-12-09 NOTE — Telephone Encounter (Signed)
Printed avs and calender of upcoming appointment. Per 3/12 los Large gap between lab and o/v per los request due to CT need 3 hr test results before provider visit.

## 2017-12-09 NOTE — Progress Notes (Signed)
[place] OFFICE PROGRESS NOTE  Claretta Fraise, MD [place] [id number]  DIAGNOSIS: Stage IA (T1b, N0, M0) non-small cell lung cancer, well-differentiated adenocarcinoma presented with left upper lobe pulmonary nodule diagnosed in September 2017.  PRIOR THERAPY: Status post left VATS, left upper lobectomy with mediastinal lymph node dissection under the care of Dr. Roxan Hockey on 08/01/2016.  CURRENT THERAPY: Observation.  INTERVAL HISTORY: Randall Sullivan 67 y.o. male returns for a routine follow-up visit accompanied by his wife.  The patient is feeling fine today with no specific complaints.  He reports that he had a recent upper respiratory virus within the past week and is feeling better.  He denies fevers and chills.  Denies chest pain, shortness breath, cough, hemoptysis.  Denies nausea, vomiting, constipation, diarrhea.  Denies any recent weight loss or night sweats.  The patient had a recent CT scan of the chest performed and is here to discuss the results.  MEDICAL HISTORY: Past Medical History:  Diagnosis Date  . Arthritis    back & legs ( knees)  . Complication of anesthesia    agitated upon waking from anesth.   . Emphysema   . History of lung cancer   . Hypertension   . Tobacco abuse     ALLERGIES:  is allergic to no known allergies.  MEDICATIONS:  Current Outpatient Medications  Medication Sig Dispense Refill  . acetaminophen (TYLENOL) 500 MG tablet Take 1,000 mg by mouth every 6 (six) hours as needed (for arthritis pain.).     [name] albuterol (PROVENTIL) (2.5 MG/3ML) 0.083% nebulizer solution USE 1 VIAL PER NEBULIZATION FOUR TIMES DAILY AS NEEDED  5  . aspirin EC 81 MG tablet Take 81 mg by mouth daily.    [name] lisinopril (PRINIVIL,ZESTRIL) 30 MG tablet TAKE 1 TABLET BY MOUTH EVERY DAY 30 tablet 0  . meclizine (ANTIVERT) 25 MG tablet Take 1 tablet (25 mg total) by mouth 3 (three) times daily as needed for dizziness. May cause drowsiness 30 tablet 0   . fexofenadine (ALLEGRA) 180 MG tablet Take 180 mg by mouth daily as needed for allergies or rhinitis.    . fluticasone (FLONASE) 50 MCG/ACT nasal spray Place into both nostrils daily.    . fluticasone furoate-vilanterol (BREO ELLIPTA) 100-25 MCG/INH AEPB Inhale 1 puff into the lungs daily. (Patient not taking: Reported on 12/09/2017) 1 each 0  . Fluticasone-Salmeterol (AIRDUO RESPICLICK 237/62) 831-51 MCG/ACT AEPB Inhale 1 puff into the lungs 2 (two) times daily. (Patient not taking: Reported on 12/09/2017) 1 each 5  . guaiFENesin (MUCINEX) 600 MG 12 hr tablet Take 2 tablets (1,200 mg total) by mouth 2 (two) times daily as needed. For cough (Patient not taking: Reported on 12/09/2017)    . naproxen (NAPROSYN) 500 MG tablet Take 1 tablet (500 mg total) by mouth 2 (two) times daily with a meal. (Patient not taking: Reported on 12/09/2017) 10 tablet 0   No current facility-administered medications for this visit.     SURGICAL HISTORY:  Past Surgical History:  Procedure Laterality Date  . HERNIA REPAIR Bilateral 7616   umbilical  . VIDEO ASSISTED THORACOSCOPY (VATS)/ LOBECTOMY Left 08/01/2016   Procedure: VIDEO ASSISTED THORACOSCOPY (VATS)/ LEFT UPPER LOBECTOMY with lymph node sampling and onQ placement;  Surgeon: [name], MD;  Location: [facility];  Service: Thoracic;  Laterality: Left;  [name] VIDEO BRONCHOSCOPY WITH ENDOBRONCHIAL NAVIGATION N/A 07/03/2016   Procedure: VIDEO BRONCHOSCOPY WITH ENDOBRONCHIAL NAVIGATION;  Surgeon: [name], MD;  Location:  [facility] OR;  Service: Thoracic;  Laterality: N/A;    REVIEW OF SYSTEMS:   Review of Systems  Constitutional: Negative for appetite change, chills, fatigue, fever and unexpected weight change.  HENT:   Negative for mouth sores, nosebleeds, sore throat and trouble swallowing.   Eyes: Negative for eye problems and icterus.  Respiratory: Negative for cough, hemoptysis, shortness of breath and wheezing.   Cardiovascular: Negative for chest  pain and leg swelling.  Gastrointestinal: Negative for abdominal pain, constipation, diarrhea, nausea and vomiting.  Genitourinary: Negative for bladder incontinence, difficulty urinating, dysuria, frequency and hematuria.   Musculoskeletal: Negative for back pain, gait problem, neck pain and neck stiffness.  Skin: Negative for itching and rash.  Neurological: Negative for dizziness, extremity weakness, gait problem, headaches, light-headedness and seizures.  Hematological: Negative for adenopathy. Does not bruise/bleed easily.  Psychiatric/Behavioral: Negative for confusion, depression and sleep disturbance. The patient is not nervous/anxious.     PHYSICAL EXAMINATION:  Blood pressure (!) 146/87, pulse 82, temperature 98.3 F (36.8 C), temperature source Oral, resp. rate 18, height 5\' 10"  (1.778 m), weight 198 lb 3.2 oz (89.9 kg), SpO2 95 %.  ECOG PERFORMANCE STATUS: 1 - Symptomatic but completely ambulatory  Physical Exam  Constitutional: Oriented to person, place, and time and well-developed, well-nourished, and in no distress. No distress.  HENT:  Head: Normocephalic and atraumatic.  Mouth/Throat: Oropharynx is clear and moist. No oropharyngeal exudate.  Eyes: Conjunctivae are normal. Right eye exhibits no discharge. Left eye exhibits no discharge. No scleral icterus.  Neck: Normal range of motion. Neck supple.  Cardiovascular: Normal rate, regular rhythm, normal heart sounds and intact distal pulses.   Pulmonary/Chest: Effort normal and breath sounds normal. No respiratory distress. No wheezes. No rales.  Abdominal: Soft. Bowel sounds are normal. Exhibits no distension and no mass. There is no tenderness.  Musculoskeletal: Normal range of motion. Exhibits no edema.  Lymphadenopathy:    No cervical adenopathy.  Neurological: Alert and oriented to person, place, and time. Exhibits normal muscle tone. Gait normal. Coordination normal.  Skin: Skin is warm and dry. No rash noted. Not  diaphoretic. No erythema. No pallor.  Psychiatric: Mood, memory and judgment normal.  Vitals reviewed.  LABORATORY DATA: Lab Results  Component Value Date   WBC 8.8 12/09/2017   HGB 13.6 12/09/2017   HCT 41.6 12/09/2017   MCV 82.0 12/09/2017   PLT 269 12/09/2017      Chemistry      Component Value Date/Time   NA 137 12/09/2017 0941   NA 136 04/09/2017 1249   K 5.1 12/09/2017 0941   K 4.6 04/09/2017 1249   CL 102 12/09/2017 0941   CO2 28 12/09/2017 0941   CO2 27 04/09/2017 1249   BUN 8 12/09/2017 0941   BUN 23.9 04/09/2017 1249   CREATININE 0.95 12/09/2017 0941   CREATININE 1.4 (H) 04/09/2017 1249      Component Value Date/Time   CALCIUM 9.9 12/09/2017 0941   CALCIUM 9.6 04/09/2017 1249   ALKPHOS 64 12/09/2017 0941   ALKPHOS 58 04/09/2017 1249   AST 13 12/09/2017 0941   AST 13 04/09/2017 1249   ALT 10 12/09/2017 0941   ALT 9 04/09/2017 1249   BILITOT 0.6 12/09/2017 0941   BILITOT 1.01 04/09/2017 1249       RADIOGRAPHIC STUDIES:  Ct Chest Wo Contrast  Result Date: 12/09/2017 CLINICAL DATA:  Stage IA left upper lobe lung adenocarcinoma diagnosed September 2017 status post left upper lobectomy and mediastinal lymph node dissection  08/01/2016. Patient presents for routine restaging on interval observation. EXAM: CT CHEST WITHOUT CONTRAST TECHNIQUE: Multidetector CT imaging of the chest was performed following the standard protocol without IV contrast. COMPARISON:  04/09/2017 chest CT. FINDINGS: Cardiovascular: Normal heart size. No significant pericardial fluid/thickening. Three-vessel coronary atherosclerosis. Atherosclerotic nonaneurysmal thoracic aorta. Normal caliber pulmonary arteries. Mediastinum/Nodes: No discrete thyroid nodules. Unremarkable esophagus. No pathologically enlarged axillary, mediastinal or gross hilar lymph nodes, noting limited sensitivity for the detection of hilar adenopathy on this noncontrast study. Lungs/Pleura: Status post left upper lobectomy.  No pneumothorax. Stable moderate centrilobular and paraseptal emphysema with bullous emphysema at the right lung apex. No pleural effusion. Enlarging posterior right upper lobe solid 7 mm pulmonary nodule (series 7/image 53) measured 4 mm on 04/09/2017 and 2 mm on 06/01/2016. No acute consolidative airspace disease, lung masses or additional significant pulmonary nodules. Upper abdomen: No acute abnormality. Musculoskeletal: No aggressive appearing focal osseous lesions. Mild thoracic spondylosis. IMPRESSION: 1. Slowly enlarging 7 mm solid posterior right upper lobe pulmonary nodule, worrisome for contralateral pulmonary metastasis or metachronous primary bronchogenic carcinoma. This nodule remains below PET resolution and is probably too small for biopsy at this time. At a minimum, close chest CT surveillance advised in 3-6 months. 2. No additional potential sites of metastatic disease in the chest. No thoracic adenopathy. 3. Three-vessel coronary atherosclerosis. Aortic Atherosclerosis (ICD10-I70.0) and Emphysema (ICD10-J43.9). Electronically Signed   By: Ilona Sorrel M.D.   On: 12/09/2017 13:08     ASSESSMENT/PLAN:  Primary lung adenocarcinoma [facility]) This is a very pleasant 66 year old white male with a stage IA non-small cell lung cancer status post left lower lobectomy with lymph node dissection under the care of Dr. Roxan Hockey in November 2017 and has been observation since that time. The patient is doing fine today with no specific complaints. He had a recent restaging CT scan of the chest and is here to discuss the results.  The patient was seen with Dr. Julien Nordmann.  CT scan of the chest results and images were reviewed with the patient and his wife.  We discussed that there is a new small nodule in the right upper lobe of the lung.  This nodule is too small to obtain a PET scan or for a biopsy.  Recommend continued observation.  He will have a restaging CT scan of the chest in approximately 3 months.   He will follow-up 1-2 days after the scan to discuss the results.  He was advised to call immediately if he has any concerning symptoms in the interval. The patient voices understanding of current disease status and treatment options and is in agreement with the current care plan. All questions were answered. The patient knows to call the clinic with any problems, questions or concerns. We can certainly see the patient much sooner if necessary.  Orders Placed This Encounter  Procedures  . CT CHEST W CONTRAST    Standing Status:   Future    Standing Expiration Date:   12/10/2018    Order Specific Question:   If indicated for the ordered procedure, I authorize the administration of contrast media per Radiology protocol    Answer:   Yes    Order Specific Question:   Preferred imaging location?    Answer:   [facility]    Order Specific Question:   Radiology Contrast Protocol - do NOT remove file path    Answer:   [email or web address]    Order Specific Question:   Reason for Exam additional  comments    Answer:   Lung cancer. Restaging. F/U right upper lobe nodule.  [name] CBC with Differential (Cancer Center Only)    Standing Status:   Future    Standing Expiration Date:   12/10/2018  . CMP ([facility] only)    Standing Status:   Future    Standing Expiration Date:   12/10/2018    Mikey Bussing, DNP, AGPCNP-BC, AOCNP 12/09/17  ADDENDUM: Hematology/Oncology Attending: I had a face-to-face encounter with the patient today.  I recommended his care plan.  This is a very pleasant 67 years old white male with a stage Ia non-small cell lung cancer status post left lower lobectomy with lymph node dissection in November 2017 and has been in observation since that time. The patient had repeat CT scan of the chest performed earlier today.  His a scan showed a stable disease except for new right upper lobe pulmonary nodule concerning for metachronous primary. I  personally and independently reviewed the scans and discussed the results and showed the images to the patient today. I recommended for him to have repeat CT scan of the chest in 3 months for reevaluation of this nodule.  The nodule is below the detection of the PET scan and also is too small for biopsy at this point. The patient was advised to call immediately if he has any concerning symptoms in the interval.  Disclaimer: This note was dictated with voice recognition software. Similar sounding words can inadvertently be transcribed and may be missed upon review. Eilleen Kempf, MD 12/09/17

## 2017-12-09 NOTE — Telephone Encounter (Signed)
No samples available, patient aware

## 2017-12-09 NOTE — Assessment & Plan Note (Signed)
This is a very pleasant 67 year old white male with a stage IA non-small cell lung cancer status post left lower lobectomy with lymph node dissection under the care of Dr. Roxan Hockey in November 2017 and has been observation since that time. The patient is doing fine today with no specific complaints. He had a recent restaging CT scan of the chest and is here to discuss the results.  The patient was seen with Dr. Julien Nordmann.  CT scan of the chest results and images were reviewed with the patient and his wife.  We discussed that there is a new small nodule in the right upper lobe of the lung.  This nodule is too small to obtain a PET scan or for a biopsy.  Recommend continued observation.  He will have a restaging CT scan of the chest in approximately 3 months.  He will follow-up 1-2 days after the scan to discuss the results.  He was advised to call immediately if he has any concerning symptoms in the interval. The patient voices understanding of current disease status and treatment options and is in agreement with the current care plan. All questions were answered. The patient knows to call the clinic with any problems, questions or concerns. We can certainly see the patient much sooner if necessary.

## 2017-12-09 NOTE — Telephone Encounter (Signed)
Pt needs samples for breo

## 2017-12-10 ENCOUNTER — Telehealth: Payer: Self-pay | Admitting: Family Medicine

## 2017-12-10 NOTE — Telephone Encounter (Signed)
Spoke with pt's wife and advised he ntbs and pt given appt for 3/19 at 9:55.

## 2017-12-12 ENCOUNTER — Other Ambulatory Visit: Payer: Self-pay | Admitting: Family Medicine

## 2017-12-12 DIAGNOSIS — I10 Essential (primary) hypertension: Secondary | ICD-10-CM

## 2017-12-12 NOTE — Telephone Encounter (Signed)
Authorize 30 days only. Then contact the patient letting them know that they will need an appointment before any further prescriptions can be sent in. 

## 2017-12-12 NOTE — Telephone Encounter (Signed)
Last seen 03/27/17  Dr Livia Snellen

## 2017-12-16 ENCOUNTER — Ambulatory Visit: Payer: Medicare Other | Admitting: Family Medicine

## 2017-12-25 ENCOUNTER — Other Ambulatory Visit: Payer: Self-pay | Admitting: Pediatrics

## 2018-01-06 DIAGNOSIS — H6981 Other specified disorders of Eustachian tube, right ear: Secondary | ICD-10-CM | POA: Diagnosis not present

## 2018-01-06 DIAGNOSIS — J3089 Other allergic rhinitis: Secondary | ICD-10-CM | POA: Diagnosis not present

## 2018-01-06 DIAGNOSIS — Z85118 Personal history of other malignant neoplasm of bronchus and lung: Secondary | ICD-10-CM | POA: Diagnosis not present

## 2018-01-06 DIAGNOSIS — J449 Chronic obstructive pulmonary disease, unspecified: Secondary | ICD-10-CM | POA: Diagnosis not present

## 2018-02-05 DIAGNOSIS — J449 Chronic obstructive pulmonary disease, unspecified: Secondary | ICD-10-CM | POA: Diagnosis not present

## 2018-02-05 DIAGNOSIS — Z85118 Personal history of other malignant neoplasm of bronchus and lung: Secondary | ICD-10-CM | POA: Diagnosis not present

## 2018-02-05 DIAGNOSIS — H9191 Unspecified hearing loss, right ear: Secondary | ICD-10-CM | POA: Diagnosis not present

## 2018-02-05 DIAGNOSIS — J3089 Other allergic rhinitis: Secondary | ICD-10-CM | POA: Diagnosis not present

## 2018-03-07 ENCOUNTER — Encounter (HOSPITAL_COMMUNITY): Payer: Self-pay

## 2018-03-07 ENCOUNTER — Other Ambulatory Visit: Payer: Self-pay

## 2018-03-07 ENCOUNTER — Emergency Department (HOSPITAL_COMMUNITY)
Admission: EM | Admit: 2018-03-07 | Discharge: 2018-03-07 | Disposition: A | Discharge status: Home / Self Care | Payer: Medicare Other | Attending: Emergency Medicine | Admitting: Emergency Medicine

## 2018-03-07 ENCOUNTER — Emergency Department (HOSPITAL_COMMUNITY): Payer: Medicare Other

## 2018-03-07 DIAGNOSIS — Z902 Acquired absence of lung [part of]: Secondary | ICD-10-CM | POA: Insufficient documentation

## 2018-03-07 DIAGNOSIS — Z23 Encounter for immunization: Secondary | ICD-10-CM | POA: Diagnosis not present

## 2018-03-07 DIAGNOSIS — Z79899 Other long term (current) drug therapy: Secondary | ICD-10-CM | POA: Insufficient documentation

## 2018-03-07 DIAGNOSIS — R42 Dizziness and giddiness: Secondary | ICD-10-CM | POA: Insufficient documentation

## 2018-03-07 DIAGNOSIS — Z87891 Personal history of nicotine dependence: Secondary | ICD-10-CM | POA: Insufficient documentation

## 2018-03-07 DIAGNOSIS — Z7982 Long term (current) use of aspirin: Secondary | ICD-10-CM | POA: Diagnosis not present

## 2018-03-07 DIAGNOSIS — W19XXXA Unspecified fall, initial encounter: Secondary | ICD-10-CM | POA: Diagnosis not present

## 2018-03-07 DIAGNOSIS — S0990XA Unspecified injury of head, initial encounter: Secondary | ICD-10-CM | POA: Diagnosis not present

## 2018-03-07 DIAGNOSIS — S199XXA Unspecified injury of neck, initial encounter: Secondary | ICD-10-CM | POA: Diagnosis not present

## 2018-03-07 DIAGNOSIS — Z85118 Personal history of other malignant neoplasm of bronchus and lung: Secondary | ICD-10-CM | POA: Diagnosis not present

## 2018-03-07 LAB — CBC WITH DIFFERENTIAL/PLATELET
Basophils Absolute: 0 10*3/uL (ref 0.0–0.1)
Basophils Relative: 0 %
Eosinophils Absolute: 0.2 10*3/uL (ref 0.0–0.7)
Eosinophils Relative: 1 %
HEMATOCRIT: 44 % (ref 39.0–52.0)
Hemoglobin: 13.9 g/dL (ref 13.0–17.0)
LYMPHS PCT: 16 %
Lymphs Abs: 1.7 10*3/uL (ref 0.7–4.0)
MCH: 27.3 pg (ref 26.0–34.0)
MCHC: 31.6 g/dL (ref 30.0–36.0)
MCV: 86.4 fL (ref 78.0–100.0)
MONO ABS: 1.1 10*3/uL — AB (ref 0.1–1.0)
MONOS PCT: 11 %
NEUTROS ABS: 7.6 10*3/uL (ref 1.7–7.7)
Neutrophils Relative %: 72 %
Platelets: 291 10*3/uL (ref 150–400)
RBC: 5.09 MIL/uL (ref 4.22–5.81)
RDW: 14.6 % (ref 11.5–15.5)
WBC: 10.5 10*3/uL (ref 4.0–10.5)

## 2018-03-07 LAB — COMPREHENSIVE METABOLIC PANEL
ALT: 12 U/L — ABNORMAL LOW (ref 17–63)
ANION GAP: 9 (ref 5–15)
AST: 17 U/L (ref 15–41)
Albumin: 4 g/dL (ref 3.5–5.0)
Alkaline Phosphatase: 52 U/L (ref 38–126)
BILIRUBIN TOTAL: 0.9 mg/dL (ref 0.3–1.2)
BUN: 12 mg/dL (ref 6–20)
CO2: 29 mmol/L (ref 22–32)
Calcium: 9.3 mg/dL (ref 8.9–10.3)
Chloride: 101 mmol/L (ref 101–111)
Creatinine, Ser: 1.1 mg/dL (ref 0.61–1.24)
GFR calc Af Amer: >60 mL/min (ref >60)
Glucose, Bld: 87 mg/dL (ref 65–99)
POTASSIUM: 4.4 mmol/L (ref 3.5–5.1)
Sodium: 139 mmol/L (ref 135–145)
Total Protein: 7.7 g/dL (ref 6.5–8.1)

## 2018-03-07 MED ORDER — ALBUTEROL SULFATE (2.5 MG/3ML) 0.083% IN NEBU
2.5000 mg | INHALATION_SOLUTION | Freq: Once | RESPIRATORY_TRACT | Status: AC
  Administered 2018-03-07: 2.5 mg via RESPIRATORY_TRACT
  Filled 2018-03-07: qty 3

## 2018-03-07 MED ORDER — SODIUM CHLORIDE 0.9 % IV BOLUS
1000.0000 mL | Freq: Once | INTRAVENOUS | Status: AC
  Administered 2018-03-07: 1000 mL via INTRAVENOUS

## 2018-03-07 MED ORDER — ACETAMINOPHEN 500 MG PO TABS
1000.0000 mg | ORAL_TABLET | Freq: Once | ORAL | Status: AC
  Administered 2018-03-07: 1000 mg via ORAL
  Filled 2018-03-07: qty 2

## 2018-03-07 MED ORDER — TETANUS-DIPHTH-ACELL PERTUSSIS 5-2.5-18.5 LF-MCG/0.5 IM SUSP
0.5000 mL | Freq: Once | INTRAMUSCULAR | Status: AC
  Administered 2018-03-07: 0.5 mL via INTRAMUSCULAR
  Filled 2018-03-07: qty 0.5

## 2018-03-07 NOTE — ED Provider Notes (Signed)
[facility] EMERGENCY DEPARTMENT Provider Note   CSN: [id number] Arrival date & time: 03/07/18  1627     History   Chief Complaint Chief Complaint  Patient presents with  . Dizziness  . Laceration    HPI Conor Lata is a 67 y.o. male.  Patient states she had an episode of vertigo today and fell and hit his head no loss of consciousness.  This is happened to him before.  The history is provided by the patient. No language interpreter was used.  Dizziness  Quality:  Head spinning Severity:  Mild Onset quality:  Sudden Timing:  Rare Progression:  Resolved Chronicity:  Recurrent Context: not when bending over   Associated symptoms: no chest pain, no diarrhea and no headaches   Laceration      Past Medical History:  Diagnosis Date  . Arthritis    back & legs ( knees)  . Complication of anesthesia    agitated upon waking from anesth.   . Emphysema   . History of lung cancer   . Hypertension   . Tobacco abuse     Patient Active Problem List   Diagnosis Date Noted  . Primary lung adenocarcinoma ([place]) 10/15/2016  . PAF (paroxysmal atrial fibrillation) ([place]) 09/16/2016  . Encounter for therapeutic drug monitoring 09/16/2016  . S/P lobectomy of lung 08/05/2016  . Cancer of left lung ([place]) 08/01/2016  . Lung nodule 06/11/2016  . Former smoker 06/11/2016  . Emphysema of lung ([place]) 06/11/2016  . Essential (primary) hypertension 10/06/2014    Past Surgical History:  Procedure Laterality Date  . HERNIA REPAIR Bilateral 4098   umbilical  . VIDEO ASSISTED THORACOSCOPY (VATS)/ LOBECTOMY Left 08/01/2016   Procedure: VIDEO ASSISTED THORACOSCOPY (VATS)/ LEFT UPPER LOBECTOMY with lymph node sampling and onQ placement;  Surgeon: [name], MD;  Location: [place];  Service: Thoracic;  Laterality: Left;  [name] VIDEO BRONCHOSCOPY WITH ENDOBRONCHIAL NAVIGATION N/A 07/03/2016   Procedure: VIDEO BRONCHOSCOPY WITH ENDOBRONCHIAL NAVIGATION;  Surgeon: [name], MD;   Location: [place];  Service: Thoracic;  Laterality: N/A;        Home Medications    Prior to Admission medications   Medication Sig Start Date End Date Taking? Authorizing Provider  acetaminophen (TYLENOL) 500 MG tablet Take 1,000 mg by mouth every 6 (six) hours as needed (for arthritis pain.).    Yes [provider]  albuterol (PROVENTIL) (2.5 MG/3ML) 0.083% nebulizer solution USE 1 VIAL PER NEBULIZATION FOUR TIMES DAILY AS NEEDED FOR SHORTNESS OF BREATH 04/09/17  Yes [provider]  aspirin EC 81 MG tablet Take 81 mg by mouth daily.   Yes [provider]  fexofenadine (ALLEGRA) 180 MG tablet Take 180 mg by mouth daily as needed for allergies or rhinitis.   Yes [provider]  lisinopril (PRINIVIL,ZESTRIL) 30 MG tablet TAKE 1 TABLET BY MOUTH EVERY DAY 12/12/17  Yes Stacks, Cletus Gash, MD  meclizine (ANTIVERT) 25 MG tablet Take 1 tablet (25 mg total) by mouth 3 (three) times daily as needed for dizziness. May cause drowsiness 09/01/17  Yes Triplett, Tammy, PA-C  fluticasone furoate-vilanterol (BREO ELLIPTA) 100-25 MCG/INH AEPB Inhale 1 puff into the lungs daily. Patient not taking: Reported on 12/09/2017 04/29/17   Claretta Fraise, MD  Fluticasone-Salmeterol [facility] RESPICLICK 119/14) 782-95 MCG/ACT AEPB Inhale 1 puff into the lungs 2 (two) times daily. Patient not taking: Reported on 12/09/2017 02/19/17   Juanito Doom, MD    Family History Family History  Problem Relation Age of  Onset  . Cancer Mother   . Cancer Sister   . Aneurysm Sister   . Kidney disease Paternal Aunt   . Asthma Brother   . Cancer - Lung Brother     Social History Social History   Tobacco Use  . Smoking status: Former Smoker    Packs/day: 1.00    Years: 40.00    Pack years: 40.00    Types: Cigarettes    Last attempt to quit: 05/28/2016    Years since quitting: 1.7  . Smokeless tobacco: Never Used  Substance Use Topics  . Alcohol use: No    Comment: quit- 2016  . Drug  use: No     Allergies   No known allergies   Review of Systems Review of Systems  Constitutional: Negative for appetite change and fatigue.  HENT: Negative for congestion, ear discharge and sinus pressure.   Eyes: Negative for discharge.  Respiratory: Negative for cough.   Cardiovascular: Negative for chest pain.  Gastrointestinal: Negative for abdominal pain and diarrhea.  Genitourinary: Negative for frequency and hematuria.  Musculoskeletal: Negative for back pain.  Skin: Negative for rash.  Neurological: Positive for dizziness. Negative for seizures and headaches.  Psychiatric/Behavioral: Negative for hallucinations.     Physical Exam Updated Vital Signs BP (!) 141/89   Pulse 64   Temp 97.6 F (36.4 C) (Oral)   Resp 19   Ht 5\' 10"  (1.778 m)   Wt 91.2 kg (201 lb)   SpO2 97%   BMI 28.84 kg/m   Physical Exam  Constitutional: He is oriented to person, place, and time. He appears well-developed.  HENT:  Head: Normocephalic.  Abrasion to the top of his head  Eyes: Conjunctivae and EOM are normal. No scleral icterus.  Neck: Neck supple. No thyromegaly present.  Cardiovascular: Normal rate and regular rhythm. Exam reveals no gallop and no friction rub.  No murmur heard. Pulmonary/Chest: No stridor. He has no wheezes. He has no rales. He exhibits no tenderness.  Abdominal: He exhibits no distension. There is no tenderness. There is no rebound.  Musculoskeletal: Normal range of motion. He exhibits no edema.  Lymphadenopathy:    He has no cervical adenopathy.  Neurological: He is oriented to person, place, and time. He exhibits normal muscle tone. Coordination normal.  Skin: No rash noted. No erythema.  Psychiatric: He has a normal mood and affect. His behavior is normal.     ED Treatments / Results  Labs (all labs ordered are listed, but only abnormal results are displayed) Labs Reviewed  CBC WITH DIFFERENTIAL/PLATELET - Abnormal; Notable for the following  components:      Result Value   Monocytes Absolute 1.1 (*)    All other components within normal limits  COMPREHENSIVE METABOLIC PANEL - Abnormal; Notable for the following components:   ALT 12 (*)    All other components within normal limits    EKG EKG Interpretation  Date/Time:  Saturday March 07 2018 16:28:39 EDT Ventricular Rate:  81 PR Interval:    QRS Duration: 94 QT Interval:  365 QTC Calculation: 424 R Axis:   97 Text Interpretation:  Sinus rhythm Right axis deviation Baseline wander in lead(s) III aVL aVF Confirmed by Milton Ferguson [phone]) on 03/07/2018 7:26:04 PM   Radiology Ct Head Wo Contrast  Result Date: 03/07/2018 CLINICAL DATA:  Head trauma, minor, GCS greater than 13, high clinical risk, initial exam. Vertigo. Fall after getting out of car and gas station. Trauma to head. EXAM: CT HEAD  WITHOUT CONTRAST CT CERVICAL SPINE WITHOUT CONTRAST TECHNIQUE: Multidetector CT imaging of the head and cervical spine was performed following the standard protocol without intravenous contrast. Multiplanar CT image reconstructions of the cervical spine were also generated. COMPARISON:  CT head without contrast 09/01/2017. FINDINGS: CT HEAD FINDINGS Brain: Mild atrophy and white matter changes are within normal limits for age. No acute infarct, hemorrhage, or mass lesion is present. Basal ganglia are intact. No focal cortical lesions are present. The ventricles are of normal size. The brainstem and cerebellum are normal. No significant extra-axial fluid collection is present. Vascular: Atherosclerotic calcifications are present within the cavernous internal carotid arteries bilaterally. There is no hyperdense vessel. Skull: Calvarium is intact. No focal lytic or blastic lesions are present. A right frontal scalp laceration is present without an underlying fracture. No radiopaque foreign body is present. Sinuses/Orbits: There is mild mucosal thickening within anterior right ethmoid air cells in  the inferior right frontal sinus. The remaining paranasal sinuses and the mastoid air cells are clear. Globes and orbits are within normal limits. CT CERVICAL SPINE FINDINGS Alignment: AP alignment is anatomic. Skull base and vertebrae: The craniocervical junction is within normal limits. Vertebral body heights are maintained. No acute or healing fracture is present. Soft tissues and spinal canal: Soft tissues the neck are unremarkable. There is mild heterogeneity of the thyroid without a dominant lesion. No significant adenopathy is present. Minimal vascular calcifications are present at the left carotid bifurcation without significant stenosis. Spinal canal is within normal limits. Disc levels: Uncovertebral spurring is present bilaterally at C6-7 and on the right at C5-6. Upper chest: The lung apices are clear. Severe emphysematous changes are noted. The thoracic inlet is within normal limits. IMPRESSION: 1. Right paramedian scalp laceration near the vertex without underlying fracture. 2. No acute intracranial abnormality. 3. Normal CT appearance of the brain for age. 4. Mild degenerative changes in the cervical spine without acute trauma. 5. Severe emphysema. Electronically Signed   By: San Morelle M.D.   On: 03/07/2018 19:13   Ct Cervical Spine Wo Contrast  Result Date: 03/07/2018 CLINICAL DATA:  Head trauma, minor, GCS greater than 13, high clinical risk, initial exam. Vertigo. Fall after getting out of car and gas station. Trauma to head. EXAM: CT HEAD WITHOUT CONTRAST CT CERVICAL SPINE WITHOUT CONTRAST TECHNIQUE: Multidetector CT imaging of the head and cervical spine was performed following the standard protocol without intravenous contrast. Multiplanar CT image reconstructions of the cervical spine were also generated. COMPARISON:  CT head without contrast 09/01/2017. FINDINGS: CT HEAD FINDINGS Brain: Mild atrophy and white matter changes are within normal limits for age. No acute infarct,  hemorrhage, or mass lesion is present. Basal ganglia are intact. No focal cortical lesions are present. The ventricles are of normal size. The brainstem and cerebellum are normal. No significant extra-axial fluid collection is present. Vascular: Atherosclerotic calcifications are present within the cavernous internal carotid arteries bilaterally. There is no hyperdense vessel. Skull: Calvarium is intact. No focal lytic or blastic lesions are present. A right frontal scalp laceration is present without an underlying fracture. No radiopaque foreign body is present. Sinuses/Orbits: There is mild mucosal thickening within anterior right ethmoid air cells in the inferior right frontal sinus. The remaining paranasal sinuses and the mastoid air cells are clear. Globes and orbits are within normal limits. CT CERVICAL SPINE FINDINGS Alignment: AP alignment is anatomic. Skull base and vertebrae: The craniocervical junction is within normal limits. Vertebral body heights are maintained. No acute or  healing fracture is present. Soft tissues and spinal canal: Soft tissues the neck are unremarkable. There is mild heterogeneity of the thyroid without a dominant lesion. No significant adenopathy is present. Minimal vascular calcifications are present at the left carotid bifurcation without significant stenosis. Spinal canal is within normal limits. Disc levels: Uncovertebral spurring is present bilaterally at C6-7 and on the right at C5-6. Upper chest: The lung apices are clear. Severe emphysematous changes are noted. The thoracic inlet is within normal limits. IMPRESSION: 1. Right paramedian scalp laceration near the vertex without underlying fracture. 2. No acute intracranial abnormality. 3. Normal CT appearance of the brain for age. 4. Mild degenerative changes in the cervical spine without acute trauma. 5. Severe emphysema. Electronically Signed   By: San Morelle M.D.   On: 03/07/2018 19:13    Procedures Procedures  (including critical care time)  Medications Ordered in ED Medications  sodium chloride 0.9 % bolus 1,000 mL (0 mLs Intravenous Stopped 03/07/18 1740)  Tdap (BOOSTRIX) injection 0.5 mL (0.5 mLs Intramuscular Given 03/07/18 1651)  albuterol (PROVENTIL) (2.5 MG/3ML) 0.083% nebulizer solution 2.5 mg (2.5 mg Nebulization Given 03/07/18 1803)     Initial Impression / Assessment and Plan / ED Course  I have reviewed the triage vital signs and the nursing notes.  Pertinent labs & imaging results that were available during my care of the patient were reviewed by me and considered in my medical decision making (see chart for details).     CBC see med CT head and neck all unremarkable.  Patient improved over time.  He will follow-up with his family doctor next week  Final Clinical Impressions(s) / ED Diagnoses   Final diagnoses:  Dizziness    ED Discharge Orders    None       Milton Ferguson, MD 03/07/18 Kathyrn Drown

## 2018-03-07 NOTE — Discharge Instructions (Signed)
Follow-up with your family doctor next week for recheck.  Also he can follow-up with the ear nose and throat doctor,  Dr. Benjamine Mola

## 2018-03-07 NOTE — ED Notes (Signed)
Patient to CT.

## 2018-03-07 NOTE — ED Triage Notes (Signed)
Pt reports has history of vertigo.  Reports got out of his car at a gas station and fell.  Struck top of head on curb.  PT has abrasion to top of head, dressed by ems.  Pt says feels a little dizzy now.

## 2018-03-10 ENCOUNTER — Ambulatory Visit: Payer: Medicare Other | Admitting: Internal Medicine

## 2018-03-10 ENCOUNTER — Other Ambulatory Visit: Payer: Medicare Other

## 2018-03-24 ENCOUNTER — Inpatient Hospital Stay: Payer: Medicare Other

## 2018-03-24 ENCOUNTER — Ambulatory Visit (HOSPITAL_COMMUNITY)
Admission: RE | Admit: 2018-03-24 | Discharge: 2018-03-24 | Disposition: A | Discharge status: Home / Self Care | Payer: Medicare Other | Source: Ambulatory Visit | Attending: Oncology | Admitting: Oncology

## 2018-03-24 ENCOUNTER — Encounter (HOSPITAL_COMMUNITY): Payer: Self-pay | Admitting: Radiology

## 2018-03-24 ENCOUNTER — Inpatient Hospital Stay: Payer: Medicare Other | Attending: Internal Medicine | Admitting: Internal Medicine

## 2018-03-24 VITALS — BP 145/84 | HR 92 | Temp 97.6°F | Resp 18 | Ht 70.0 in | Wt 191.9 lb

## 2018-03-24 DIAGNOSIS — I10 Essential (primary) hypertension: Secondary | ICD-10-CM | POA: Insufficient documentation

## 2018-03-24 DIAGNOSIS — M129 Arthropathy, unspecified: Secondary | ICD-10-CM | POA: Insufficient documentation

## 2018-03-24 DIAGNOSIS — Z79899 Other long term (current) drug therapy: Secondary | ICD-10-CM | POA: Insufficient documentation

## 2018-03-24 DIAGNOSIS — Z7982 Long term (current) use of aspirin: Secondary | ICD-10-CM | POA: Insufficient documentation

## 2018-03-24 DIAGNOSIS — R911 Solitary pulmonary nodule: Secondary | ICD-10-CM | POA: Diagnosis not present

## 2018-03-24 DIAGNOSIS — C3492 Malignant neoplasm of unspecified part of left bronchus or lung: Secondary | ICD-10-CM | POA: Diagnosis not present

## 2018-03-24 DIAGNOSIS — J439 Emphysema, unspecified: Secondary | ICD-10-CM | POA: Diagnosis not present

## 2018-03-24 DIAGNOSIS — S0101XA Laceration without foreign body of scalp, initial encounter: Secondary | ICD-10-CM | POA: Diagnosis not present

## 2018-03-24 DIAGNOSIS — Z902 Acquired absence of lung [part of]: Secondary | ICD-10-CM | POA: Diagnosis not present

## 2018-03-24 DIAGNOSIS — R42 Dizziness and giddiness: Secondary | ICD-10-CM | POA: Diagnosis not present

## 2018-03-24 DIAGNOSIS — F17210 Nicotine dependence, cigarettes, uncomplicated: Secondary | ICD-10-CM | POA: Insufficient documentation

## 2018-03-24 DIAGNOSIS — J438 Other emphysema: Secondary | ICD-10-CM | POA: Insufficient documentation

## 2018-03-24 DIAGNOSIS — C3412 Malignant neoplasm of upper lobe, left bronchus or lung: Secondary | ICD-10-CM | POA: Insufficient documentation

## 2018-03-24 LAB — CBC WITH DIFFERENTIAL (CANCER CENTER ONLY)
Basophils Absolute: 0.1 10*3/uL (ref 0.0–0.1)
Basophils Relative: 1 %
Eosinophils Absolute: 0.1 10*3/uL (ref 0.0–0.5)
Eosinophils Relative: 1 %
HEMATOCRIT: 39.3 % (ref 38.4–49.9)
HEMOGLOBIN: 13 g/dL (ref 13.0–17.1)
LYMPHS ABS: 1.6 10*3/uL (ref 0.9–3.3)
Lymphocytes Relative: 15 %
MCH: 26.8 pg — AB (ref 27.2–33.4)
MCHC: 33.2 g/dL (ref 32.0–36.0)
MCV: 80.8 fL (ref 79.3–98.0)
MONOS PCT: 7 %
Monocytes Absolute: 0.8 10*3/uL (ref 0.1–0.9)
NEUTROS PCT: 76 %
Neutro Abs: 8.3 10*3/uL — ABNORMAL HIGH (ref 1.5–6.5)
Platelet Count: 394 10*3/uL (ref 140–400)
RBC: 4.87 MIL/uL (ref 4.20–5.82)
RDW: 14.9 % — ABNORMAL HIGH (ref 11.0–14.6)
WBC Count: 10.9 10*3/uL — ABNORMAL HIGH (ref 4.0–10.3)

## 2018-03-24 LAB — CMP (CANCER CENTER ONLY)
ALT: 10 U/L (ref 0–44)
AST: 13 U/L — ABNORMAL LOW (ref 15–41)
Albumin: 3.9 g/dL (ref 3.5–5.0)
Alkaline Phosphatase: 64 U/L (ref 38–126)
Anion gap: 10 (ref 5–15)
BUN: 15 mg/dL (ref 8–23)
CHLORIDE: 100 mmol/L (ref 98–111)
CO2: 27 mmol/L (ref 22–32)
Calcium: 9.5 mg/dL (ref 8.9–10.3)
Creatinine: 1.13 mg/dL (ref 0.61–1.24)
GFR, Estimated: >60 mL/min (ref >60)
Glucose, Bld: 90 mg/dL (ref 70–99)
POTASSIUM: 4.4 mmol/L (ref 3.5–5.1)
SODIUM: 137 mmol/L (ref 135–145)
Total Bilirubin: 0.5 mg/dL (ref 0.3–1.2)
Total Protein: 8.1 g/dL (ref 6.5–8.1)

## 2018-03-24 MED ORDER — IOHEXOL 300 MG/ML  SOLN
75.0000 mL | Freq: Once | INTRAMUSCULAR | Status: AC | PRN
  Administered 2018-03-24: 75 mL via INTRAVENOUS

## 2018-03-24 NOTE — Progress Notes (Signed)
[facility] Telephone:([phone]   Fax:(336) [place], [place] [address]  DIAGNOSIS: Stage IA (T1b, N0, M0) non-small cell lung cancer, well-differentiated adenocarcinoma presented with left upper lobeS pulmonary nodule diagnosed in September 2017.  PRIOR THERAPY: Status post left VATS, left upper lobectomy with mediastinal lymph node dissection under the care of Dr. Roxan Hockey on 08/01/2016.  CURRENT THERAPY: Observation.  INTERVAL HISTORY: Randall Sullivan 67 y.o. male returns to the clinic today for follow-up visit accompanied by his daughter.  The patient is feeling fine today with no specific complaints except for mild shortness of breath with exertion.  He denied having any chest pain, cough or hemoptysis.  He denied having any fever or chills.  He has no nausea, vomiting, diarrhea or constipation.  He has no recent weight loss or night sweats.  He had CT scan of the chest performed earlier today and he is here for evaluation and discussion of his discuss results.  MEDICAL HISTORY: Past Medical History:  Diagnosis Date  . Arthritis    back & legs ( knees)  . Complication of anesthesia    agitated upon waking from anesth.   . Emphysema   . History of lung cancer   . Hypertension   . Tobacco abuse     ALLERGIES:  is allergic to no known allergies.  MEDICATIONS:  Current Outpatient Medications  Medication Sig Dispense Refill  . albuterol (PROVENTIL) (2.5 MG/3ML) 0.083% nebulizer solution USE 1 VIAL PER NEBULIZATION FOUR TIMES DAILY AS NEEDED FOR SHORTNESS OF BREATH  5  . aspirin EC 81 MG tablet Take 81 mg by mouth daily.    . fexofenadine (ALLEGRA) 180 MG tablet Take 180 mg by mouth daily as needed for allergies or rhinitis.    [name] lisinopril (PRINIVIL,ZESTRIL) 30 MG tablet TAKE 1 TABLET BY MOUTH EVERY DAY 30 tablet 0  . acetaminophen (TYLENOL) 500 MG tablet Take 1,000 mg by mouth every 6  (six) hours as needed (for arthritis pain.).     [name] fluticasone furoate-vilanterol (BREO ELLIPTA) 100-25 MCG/INH AEPB Inhale 1 puff into the lungs daily. (Patient not taking: Reported on 12/09/2017) 1 each 0  . Fluticasone-Salmeterol (AIRDUO RESPICLICK 081/44) 818-56 MCG/ACT AEPB Inhale 1 puff into the lungs 2 (two) times daily. (Patient not taking: Reported on 12/09/2017) 1 each 5  . meclizine (ANTIVERT) 25 MG tablet Take 1 tablet (25 mg total) by mouth 3 (three) times daily as needed for dizziness. May cause drowsiness (Patient not taking: Reported on 03/24/2018) 30 tablet 0   No current facility-administered medications for this visit.     SURGICAL HISTORY:  Past Surgical History:  Procedure Laterality Date  . HERNIA REPAIR Bilateral 3149   umbilical  . VIDEO ASSISTED THORACOSCOPY (VATS)/ LOBECTOMY Left 08/01/2016   Procedure: VIDEO ASSISTED THORACOSCOPY (VATS)/ LEFT UPPER LOBECTOMY with lymph node sampling and onQ placement;  Surgeon: [name], MD;  Location: [facility];  Service: Thoracic;  Laterality: Left;  [name] VIDEO BRONCHOSCOPY WITH ENDOBRONCHIAL NAVIGATION N/A 07/03/2016   Procedure: VIDEO BRONCHOSCOPY WITH ENDOBRONCHIAL NAVIGATION;  Surgeon: [name], MD;  Location: [facility];  Service: Thoracic;  Laterality: N/A;    REVIEW OF SYSTEMS:  A comprehensive review of systems was negative except for: Respiratory: positive for dyspnea on exertion   PHYSICAL EXAMINATION: General appearance: alert, cooperative and no distress Head: Normocephalic, without obvious abnormality, atraumatic Neck: no adenopathy, no JVD, supple, symmetrical, trachea  midline and thyroid not enlarged, symmetric, no tenderness/mass/nodules Lymph nodes: Cervical, supraclavicular, and axillary nodes normal. Resp: clear to auscultation bilaterally Back: symmetric, no curvature. ROM normal. No CVA tenderness. Cardio: regular rate and rhythm, S1, S2 normal, no murmur, click, rub or gallop GI: soft, non-tender;  bowel sounds normal; no masses,  no organomegaly Extremities: extremities normal, atraumatic, no cyanosis or edema  ECOG PERFORMANCE STATUS: 1 - Symptomatic but completely ambulatory  Blood pressure (!) 145/84, pulse 92, temperature 97.6 F (36.4 C), temperature source Oral, resp. rate 18, height 5\' 10"  (1.778 m), weight 191 lb 14.4 oz (87 kg), SpO2 100 %.  LABORATORY DATA: Lab Results  Component Value Date   WBC 10.9 (H) 03/24/2018   HGB 13.0 03/24/2018   HCT 39.3 03/24/2018   MCV 80.8 03/24/2018   PLT 394 03/24/2018      Chemistry      Component Value Date/Time   NA 137 03/24/2018 1307   NA 136 04/09/2017 1249   K 4.4 03/24/2018 1307   K 4.6 04/09/2017 1249   CL 100 03/24/2018 1307   CO2 27 03/24/2018 1307   CO2 27 04/09/2017 1249   BUN 15 03/24/2018 1307   BUN 23.9 04/09/2017 1249   CREATININE 1.13 03/24/2018 1307   CREATININE 1.4 (H) 04/09/2017 1249      Component Value Date/Time   CALCIUM 9.5 03/24/2018 1307   CALCIUM 9.6 04/09/2017 1249   ALKPHOS 64 03/24/2018 1307   ALKPHOS 58 04/09/2017 1249   AST 13 (L) 03/24/2018 1307   AST 13 04/09/2017 1249   ALT 10 03/24/2018 1307   ALT 9 04/09/2017 1249   BILITOT 0.5 03/24/2018 1307   BILITOT 1.01 04/09/2017 1249       RADIOGRAPHIC STUDIES: Ct Head Wo Contrast  Result Date: 03/07/2018 CLINICAL DATA:  Head trauma, minor, GCS greater than 13, high clinical risk, initial exam. Vertigo. Fall after getting out of car and gas station. Trauma to head. EXAM: CT HEAD WITHOUT CONTRAST CT CERVICAL SPINE WITHOUT CONTRAST TECHNIQUE: Multidetector CT imaging of the head and cervical spine was performed following the standard protocol without intravenous contrast. Multiplanar CT image reconstructions of the cervical spine were also generated. COMPARISON:  CT head without contrast 09/01/2017. FINDINGS: CT HEAD FINDINGS Brain: Mild atrophy and white matter changes are within normal limits for age. No acute infarct, hemorrhage, or mass  lesion is present. Basal ganglia are intact. No focal cortical lesions are present. The ventricles are of normal size. The brainstem and cerebellum are normal. No significant extra-axial fluid collection is present. Vascular: Atherosclerotic calcifications are present within the cavernous internal carotid arteries bilaterally. There is no hyperdense vessel. Skull: Calvarium is intact. No focal lytic or blastic lesions are present. A right frontal scalp laceration is present without an underlying fracture. No radiopaque foreign body is present. Sinuses/Orbits: There is mild mucosal thickening within anterior right ethmoid air cells in the inferior right frontal sinus. The remaining paranasal sinuses and the mastoid air cells are clear. Globes and orbits are within normal limits. CT CERVICAL SPINE FINDINGS Alignment: AP alignment is anatomic. Skull base and vertebrae: The craniocervical junction is within normal limits. Vertebral body heights are maintained. No acute or healing fracture is present. Soft tissues and spinal canal: Soft tissues the neck are unremarkable. There is mild heterogeneity of the thyroid without a dominant lesion. No significant adenopathy is present. Minimal vascular calcifications are present at the left carotid bifurcation without significant stenosis. Spinal canal is within normal limits. Disc  levels: Uncovertebral spurring is present bilaterally at C6-7 and on the right at C5-6. Upper chest: The lung apices are clear. Severe emphysematous changes are noted. The thoracic inlet is within normal limits. IMPRESSION: 1. Right paramedian scalp laceration near the vertex without underlying fracture. 2. No acute intracranial abnormality. 3. Normal CT appearance of the brain for age. 4. Mild degenerative changes in the cervical spine without acute trauma. 5. Severe emphysema. Electronically Signed   By: San Morelle M.D.   On: 03/07/2018 19:13   Ct Cervical Spine Wo Contrast  Result  Date: 03/07/2018 CLINICAL DATA:  Head trauma, minor, GCS greater than 13, high clinical risk, initial exam. Vertigo. Fall after getting out of car and gas station. Trauma to head. EXAM: CT HEAD WITHOUT CONTRAST CT CERVICAL SPINE WITHOUT CONTRAST TECHNIQUE: Multidetector CT imaging of the head and cervical spine was performed following the standard protocol without intravenous contrast. Multiplanar CT image reconstructions of the cervical spine were also generated. COMPARISON:  CT head without contrast 09/01/2017. FINDINGS: CT HEAD FINDINGS Brain: Mild atrophy and white matter changes are within normal limits for age. No acute infarct, hemorrhage, or mass lesion is present. Basal ganglia are intact. No focal cortical lesions are present. The ventricles are of normal size. The brainstem and cerebellum are normal. No significant extra-axial fluid collection is present. Vascular: Atherosclerotic calcifications are present within the cavernous internal carotid arteries bilaterally. There is no hyperdense vessel. Skull: Calvarium is intact. No focal lytic or blastic lesions are present. A right frontal scalp laceration is present without an underlying fracture. No radiopaque foreign body is present. Sinuses/Orbits: There is mild mucosal thickening within anterior right ethmoid air cells in the inferior right frontal sinus. The remaining paranasal sinuses and the mastoid air cells are clear. Globes and orbits are within normal limits. CT CERVICAL SPINE FINDINGS Alignment: AP alignment is anatomic. Skull base and vertebrae: The craniocervical junction is within normal limits. Vertebral body heights are maintained. No acute or healing fracture is present. Soft tissues and spinal canal: Soft tissues the neck are unremarkable. There is mild heterogeneity of the thyroid without a dominant lesion. No significant adenopathy is present. Minimal vascular calcifications are present at the left carotid bifurcation without significant  stenosis. Spinal canal is within normal limits. Disc levels: Uncovertebral spurring is present bilaterally at C6-7 and on the right at C5-6. Upper chest: The lung apices are clear. Severe emphysematous changes are noted. The thoracic inlet is within normal limits. IMPRESSION: 1. Right paramedian scalp laceration near the vertex without underlying fracture. 2. No acute intracranial abnormality. 3. Normal CT appearance of the brain for age. 4. Mild degenerative changes in the cervical spine without acute trauma. 5. Severe emphysema. Electronically Signed   By: San Morelle M.D.   On: 03/07/2018 19:13    ASSESSMENT AND PLAN: This is a very pleasant 67 years old white male with a stage IA non-small cell lung cancer status post left lower lobectomy with lymph node dissection under the care of Dr. Roxan Hockey in November 2017 and has been observation since that time. The patient is doing fine today with no specific complaints.  He had repeat CT scan of the chest performed earlier today.  I personally and independently reviewed the scan images and I did not see any concerning findings except for a slightly enlarging right upper lobe pulmonary nodule.  The final report is a still pending and I will wait for the report before making final recommendation regarding his follow-up visit and  recommendations. If the final report showed no concerning findings for disease progression, I will see him back for follow-up visit in 3-6 months. We will call the patient with the results of the CT scan of the chest as well as a follow-up appointment once the final report becomes available. The patient and his daughter agreed to the current plan. The patient voices understanding of current disease status and treatment options and is in agreement with the current care plan. All questions were answered. The patient knows to call the clinic with any problems, questions or concerns. We can certainly see the patient much sooner  if necessary. I spent 10 minutes counseling the patient face to face. The total time spent in the appointment was 15 minutes.  Disclaimer: This note was dictated with voice recognition software. Similar sounding words can inadvertently be transcribed and may not be corrected upon review.

## 2018-03-26 ENCOUNTER — Telehealth: Payer: Self-pay | Admitting: Medical Oncology

## 2018-03-26 NOTE — Telephone Encounter (Signed)
wants CT chest  and f/u

## 2018-03-27 ENCOUNTER — Other Ambulatory Visit: Payer: Self-pay | Admitting: Internal Medicine

## 2018-03-27 ENCOUNTER — Telehealth: Payer: Self-pay | Admitting: *Deleted

## 2018-03-27 ENCOUNTER — Telehealth: Payer: Self-pay | Admitting: Internal Medicine

## 2018-03-27 DIAGNOSIS — C3490 Malignant neoplasm of unspecified part of unspecified bronchus or lung: Secondary | ICD-10-CM

## 2018-03-27 NOTE — Telephone Encounter (Signed)
His recent his scan is good.  I will see him back for follow-up visit in 6 months with repeat lab and scan.  Order is done.

## 2018-03-27 NOTE — Telephone Encounter (Signed)
Notified pt. 

## 2018-03-27 NOTE — Telephone Encounter (Signed)
Notified pt about scan results f/u in 6 months

## 2018-03-27 NOTE — Telephone Encounter (Signed)
Scheduled appt per 6/28 sch message - sent reminder letter in the mail.

## 2018-04-16 DIAGNOSIS — R5383 Other fatigue: Secondary | ICD-10-CM | POA: Diagnosis not present

## 2018-04-16 DIAGNOSIS — I10 Essential (primary) hypertension: Secondary | ICD-10-CM | POA: Diagnosis not present

## 2018-04-16 DIAGNOSIS — E559 Vitamin D deficiency, unspecified: Secondary | ICD-10-CM | POA: Diagnosis not present

## 2018-04-16 DIAGNOSIS — R635 Abnormal weight gain: Secondary | ICD-10-CM | POA: Diagnosis not present

## 2018-04-26 DIAGNOSIS — Z85118 Personal history of other malignant neoplasm of bronchus and lung: Secondary | ICD-10-CM | POA: Diagnosis not present

## 2018-04-26 DIAGNOSIS — I10 Essential (primary) hypertension: Secondary | ICD-10-CM | POA: Diagnosis not present

## 2018-04-26 DIAGNOSIS — J449 Chronic obstructive pulmonary disease, unspecified: Secondary | ICD-10-CM | POA: Diagnosis not present

## 2018-04-26 DIAGNOSIS — R42 Dizziness and giddiness: Secondary | ICD-10-CM | POA: Diagnosis not present

## 2018-06-04 DIAGNOSIS — Z7289 Other problems related to lifestyle: Secondary | ICD-10-CM | POA: Diagnosis not present

## 2018-06-04 DIAGNOSIS — H6121 Impacted cerumen, right ear: Secondary | ICD-10-CM | POA: Insufficient documentation

## 2018-06-04 DIAGNOSIS — H9191 Unspecified hearing loss, right ear: Secondary | ICD-10-CM | POA: Diagnosis not present

## 2018-06-04 DIAGNOSIS — R42 Dizziness and giddiness: Secondary | ICD-10-CM | POA: Diagnosis not present

## 2018-06-04 DIAGNOSIS — Z85118 Personal history of other malignant neoplasm of bronchus and lung: Secondary | ICD-10-CM | POA: Diagnosis not present

## 2018-06-04 DIAGNOSIS — F17210 Nicotine dependence, cigarettes, uncomplicated: Secondary | ICD-10-CM | POA: Diagnosis not present

## 2018-07-10 DIAGNOSIS — H903 Sensorineural hearing loss, bilateral: Secondary | ICD-10-CM | POA: Diagnosis not present

## 2018-07-13 ENCOUNTER — Other Ambulatory Visit (HOSPITAL_COMMUNITY): Payer: Self-pay | Admitting: Otolaryngology

## 2018-07-13 ENCOUNTER — Other Ambulatory Visit: Payer: Self-pay | Admitting: Otolaryngology

## 2018-07-13 DIAGNOSIS — H9191 Unspecified hearing loss, right ear: Secondary | ICD-10-CM

## 2018-07-16 ENCOUNTER — Other Ambulatory Visit (HOSPITAL_COMMUNITY): Payer: Self-pay | Admitting: Otolaryngology

## 2018-07-16 DIAGNOSIS — H9191 Unspecified hearing loss, right ear: Secondary | ICD-10-CM

## 2018-07-23 ENCOUNTER — Ambulatory Visit (HOSPITAL_COMMUNITY)
Admission: RE | Admit: 2018-07-23 | Discharge: 2018-07-23 | Disposition: A | Discharge status: Home / Self Care | Payer: Medicare Other | Source: Ambulatory Visit | Attending: Otolaryngology | Admitting: Otolaryngology

## 2018-07-23 DIAGNOSIS — H9191 Unspecified hearing loss, right ear: Secondary | ICD-10-CM | POA: Diagnosis not present

## 2018-07-23 DIAGNOSIS — I6381 Other cerebral infarction due to occlusion or stenosis of small artery: Secondary | ICD-10-CM | POA: Diagnosis not present

## 2018-07-23 DIAGNOSIS — R42 Dizziness and giddiness: Secondary | ICD-10-CM | POA: Diagnosis not present

## 2018-07-23 LAB — POCT I-STAT CREATININE: CREATININE: 1.1 mg/dL (ref 0.61–1.24)

## 2018-07-23 MED ORDER — GADOBUTROL 1 MMOL/ML IV SOLN
8.0000 mL | Freq: Once | INTRAVENOUS | Status: AC | PRN
  Administered 2018-07-23: 8 mL via INTRAVENOUS

## 2018-08-21 ENCOUNTER — Telehealth: Payer: Self-pay | Admitting: *Deleted

## 2018-08-21 NOTE — Telephone Encounter (Signed)
Pt called request for lab/CT and MD visit be on the same day as wife has to take day off work and he is not able to drive. Discussed with pt lab and CT are usually done prior to the MD visit so there is  final report to discuss. Many times the results are not final the same day.  Will review with MD on Monday.

## 2018-08-24 DIAGNOSIS — Z125 Encounter for screening for malignant neoplasm of prostate: Secondary | ICD-10-CM | POA: Diagnosis not present

## 2018-08-24 DIAGNOSIS — I10 Essential (primary) hypertension: Secondary | ICD-10-CM | POA: Diagnosis not present

## 2018-08-24 DIAGNOSIS — E785 Hyperlipidemia, unspecified: Secondary | ICD-10-CM | POA: Diagnosis not present

## 2018-08-24 DIAGNOSIS — R0602 Shortness of breath: Secondary | ICD-10-CM | POA: Diagnosis not present

## 2018-08-25 ENCOUNTER — Telehealth: Payer: Self-pay | Admitting: Medical Oncology

## 2018-08-25 NOTE — Telephone Encounter (Signed)
Family wants all appts : labs, scan and Mohamed on the 30th due to transportation. I left VM that it may be at least 2 hours for scan to be ready for [facility] and that is why we separate the appts.

## 2018-08-26 ENCOUNTER — Telehealth: Payer: Self-pay | Admitting: Medical Oncology

## 2018-08-26 NOTE — Telephone Encounter (Signed)
Want appts all on one day. I told [name] that his appts need to be split. She will try to get him here on 12/30.

## 2018-09-25 ENCOUNTER — Inpatient Hospital Stay: Payer: Medicare Other | Attending: Internal Medicine

## 2018-09-25 ENCOUNTER — Telehealth: Payer: Self-pay | Admitting: Internal Medicine

## 2018-09-25 ENCOUNTER — Ambulatory Visit (HOSPITAL_COMMUNITY)
Admission: RE | Admit: 2018-09-25 | Discharge: 2018-09-25 | Disposition: A | Discharge status: Home / Self Care | Payer: Medicare Other | Source: Ambulatory Visit | Attending: Internal Medicine | Admitting: Internal Medicine

## 2018-09-25 DIAGNOSIS — C3490 Malignant neoplasm of unspecified part of unspecified bronchus or lung: Secondary | ICD-10-CM | POA: Insufficient documentation

## 2018-09-25 DIAGNOSIS — C3492 Malignant neoplasm of unspecified part of left bronchus or lung: Secondary | ICD-10-CM | POA: Insufficient documentation

## 2018-09-25 DIAGNOSIS — R911 Solitary pulmonary nodule: Secondary | ICD-10-CM | POA: Diagnosis not present

## 2018-09-25 LAB — CMP (CANCER CENTER ONLY)
ALK PHOS: 61 U/L (ref 38–126)
ALT: 14 U/L (ref 0–44)
AST: 16 U/L (ref 15–41)
Albumin: 3.8 g/dL (ref 3.5–5.0)
Anion gap: 12 (ref 5–15)
BUN: 13 mg/dL (ref 8–23)
CHLORIDE: 103 mmol/L (ref 98–111)
CO2: 25 mmol/L (ref 22–32)
CREATININE: 1.05 mg/dL (ref 0.61–1.24)
Calcium: 9.5 mg/dL (ref 8.9–10.3)
GFR, Est AFR Am: >60 mL/min (ref >60)
GFR, Estimated: >60 mL/min (ref >60)
Glucose, Bld: 102 mg/dL — ABNORMAL HIGH (ref 70–99)
Potassium: 4.6 mmol/L (ref 3.5–5.1)
Sodium: 140 mmol/L (ref 135–145)
Total Bilirubin: 0.4 mg/dL (ref 0.3–1.2)
Total Protein: 7.7 g/dL (ref 6.5–8.1)

## 2018-09-25 LAB — CBC WITH DIFFERENTIAL (CANCER CENTER ONLY)
Abs Immature Granulocytes: 0.06 K/uL (ref 0.00–0.07)
Basophils Absolute: 0 K/uL (ref 0.0–0.1)
Basophils Relative: 0 %
Eosinophils Absolute: 0.2 K/uL (ref 0.0–0.5)
Eosinophils Relative: 2 %
HCT: 47.5 % (ref 39.0–52.0)
Hemoglobin: 14.7 g/dL (ref 13.0–17.0)
Immature Granulocytes: 1 %
Lymphocytes Relative: 18 %
Lymphs Abs: 1.7 K/uL (ref 0.7–4.0)
MCH: 26.9 pg (ref 26.0–34.0)
MCHC: 30.9 g/dL (ref 30.0–36.0)
MCV: 87 fL (ref 80.0–100.0)
Monocytes Absolute: 0.8 K/uL (ref 0.1–1.0)
Monocytes Relative: 8 %
Neutro Abs: 6.9 K/uL (ref 1.7–7.7)
Neutrophils Relative %: 71 %
Platelet Count: 279 K/uL (ref 150–400)
RBC: 5.46 MIL/uL (ref 4.22–5.81)
RDW: 14.3 % (ref 11.5–15.5)
WBC Count: 9.6 K/uL (ref 4.0–10.5)
nRBC: 0 % (ref 0.0–0.2)

## 2018-09-25 MED ORDER — IOHEXOL 300 MG/ML  SOLN
75.0000 mL | Freq: Once | INTRAMUSCULAR | Status: AC | PRN
  Administered 2018-09-25: 75 mL via INTRAVENOUS

## 2018-09-25 MED ORDER — SODIUM CHLORIDE (PF) 0.9 % IJ SOLN
INTRAMUSCULAR | Status: AC
  Filled 2018-09-25: qty 50

## 2018-09-25 NOTE — Telephone Encounter (Signed)
Patient wife came in to reschedule doctors visit from 12/30 to 01/02.

## 2018-09-28 ENCOUNTER — Inpatient Hospital Stay: Payer: Medicare Other | Admitting: Internal Medicine

## 2018-10-01 ENCOUNTER — Telehealth: Payer: Self-pay | Admitting: Internal Medicine

## 2018-10-01 ENCOUNTER — Inpatient Hospital Stay: Payer: Medicare Other | Attending: Internal Medicine | Admitting: Internal Medicine

## 2018-10-01 VITALS — BP 166/83 | HR 65 | Temp 97.8°F | Resp 20 | Wt 197.1 lb

## 2018-10-01 DIAGNOSIS — I714 Abdominal aortic aneurysm, without rupture: Secondary | ICD-10-CM | POA: Insufficient documentation

## 2018-10-01 DIAGNOSIS — M129 Arthropathy, unspecified: Secondary | ICD-10-CM

## 2018-10-01 DIAGNOSIS — I2510 Atherosclerotic heart disease of native coronary artery without angina pectoris: Secondary | ICD-10-CM | POA: Insufficient documentation

## 2018-10-01 DIAGNOSIS — Z7982 Long term (current) use of aspirin: Secondary | ICD-10-CM | POA: Diagnosis not present

## 2018-10-01 DIAGNOSIS — I10 Essential (primary) hypertension: Secondary | ICD-10-CM | POA: Diagnosis not present

## 2018-10-01 DIAGNOSIS — C3490 Malignant neoplasm of unspecified part of unspecified bronchus or lung: Secondary | ICD-10-CM

## 2018-10-01 DIAGNOSIS — C3412 Malignant neoplasm of upper lobe, left bronchus or lung: Secondary | ICD-10-CM | POA: Diagnosis not present

## 2018-10-01 DIAGNOSIS — F17210 Nicotine dependence, cigarettes, uncomplicated: Secondary | ICD-10-CM | POA: Diagnosis not present

## 2018-10-01 DIAGNOSIS — M1990 Unspecified osteoarthritis, unspecified site: Secondary | ICD-10-CM | POA: Diagnosis not present

## 2018-10-01 DIAGNOSIS — J439 Emphysema, unspecified: Secondary | ICD-10-CM | POA: Insufficient documentation

## 2018-10-01 DIAGNOSIS — C3492 Malignant neoplasm of unspecified part of left bronchus or lung: Secondary | ICD-10-CM

## 2018-10-01 DIAGNOSIS — R5383 Other fatigue: Secondary | ICD-10-CM | POA: Diagnosis not present

## 2018-10-01 DIAGNOSIS — Z79899 Other long term (current) drug therapy: Secondary | ICD-10-CM | POA: Insufficient documentation

## 2018-10-01 DIAGNOSIS — Z87891 Personal history of nicotine dependence: Secondary | ICD-10-CM

## 2018-10-01 NOTE — Progress Notes (Signed)
[facility] Telephone:([phone]   Fax:(336) [phone]  OFFICE PROGRESS NOTE  Brigitte Pulse, DO [place] [address]  DIAGNOSIS: Stage IA (T1b, N0, M0) non-small cell lung cancer, well-differentiated adenocarcinoma presented with left upper lobeS pulmonary nodule diagnosed in September 2017.  PRIOR THERAPY: Status post left VATS, left upper lobectomy with mediastinal lymph node dissection under the care of Dr. Roxan Hockey on 08/01/2016.  CURRENT THERAPY: Observation.  INTERVAL HISTORY: Randall Sullivan 68 y.o. male returns to the clinic today for follow-up visit accompanied by his wife.  The patient has no complaints today except for mild fatigue.  He denied having any chest pain but has shortness of breath with exertion with no cough or hemoptysis.  He denied having any recent weight loss or night sweats.  He has no nausea, vomiting, diarrhea or constipation.  He denied having any headache or visual changes.  He had repeat CT scan of the chest performed recently and he is here for evaluation and discussion of his scan results.  MEDICAL HISTORY: Past Medical History:  Diagnosis Date  . Arthritis    back & legs ( knees)  . Complication of anesthesia    agitated upon waking from anesth.   . Emphysema   . History of lung cancer   . Hypertension   . Tobacco abuse     ALLERGIES:  is allergic to no known allergies.  MEDICATIONS:  Current Outpatient Medications  Medication Sig Dispense Refill  . acetaminophen (TYLENOL) 500 MG tablet Take 1,000 mg by mouth every 6 (six) hours as needed (for arthritis pain.).     [name] albuterol (PROVENTIL) (2.5 MG/3ML) 0.083% nebulizer solution USE 1 VIAL PER NEBULIZATION FOUR TIMES DAILY AS NEEDED FOR SHORTNESS OF BREATH  5  . aspirin EC 81 MG tablet Take 81 mg by mouth daily.    . fluticasone furoate-vilanterol (BREO ELLIPTA) 100-25 MCG/INH AEPB Inhale 1 puff into the lungs daily. 1 each 0  . meclizine  (ANTIVERT) 25 MG tablet Take 1 tablet (25 mg total) by mouth 3 (three) times daily as needed for dizziness. May cause drowsiness 30 tablet 0  . metoprolol tartrate (LOPRESSOR) 50 MG tablet Take by mouth.    . fexofenadine (ALLEGRA) 180 MG tablet Take 180 mg by mouth daily as needed for allergies or rhinitis.    . Fluticasone-Salmeterol (AIRDUO RESPICLICK 921/19) 417-40 MCG/ACT AEPB Inhale 1 puff into the lungs 2 (two) times daily. (Patient not taking: Reported on 12/09/2017) 1 each 5   No current facility-administered medications for this visit.     SURGICAL HISTORY:  Past Surgical History:  Procedure Laterality Date  . HERNIA REPAIR Bilateral 8144   umbilical  . VIDEO ASSISTED THORACOSCOPY (VATS)/ LOBECTOMY Left 08/01/2016   Procedure: VIDEO ASSISTED THORACOSCOPY (VATS)/ LEFT UPPER LOBECTOMY with lymph node sampling and onQ placement;  Surgeon: [name], MD;  Location: [facility];  Service: Thoracic;  Laterality: Left;  [name] VIDEO BRONCHOSCOPY WITH ENDOBRONCHIAL NAVIGATION N/A 07/03/2016   Procedure: VIDEO BRONCHOSCOPY WITH ENDOBRONCHIAL NAVIGATION;  Surgeon: [name], MD;  Location: [facility];  Service: Thoracic;  Laterality: N/A;    REVIEW OF SYSTEMS:  Constitutional: positive for fatigue Eyes: negative Ears, nose, mouth, throat, and face: negative Respiratory: positive for dyspnea on exertion Cardiovascular: negative Gastrointestinal: negative Genitourinary:negative Integument/breast: negative Hematologic/lymphatic: negative Musculoskeletal:negative Neurological: negative Behavioral/Psych: negative Endocrine: negative Allergic/Immunologic: negative   PHYSICAL EXAMINATION: General appearance: alert, cooperative and no distress Head: Normocephalic, without obvious abnormality, atraumatic  Neck: no adenopathy, no JVD, supple, symmetrical, trachea midline and thyroid not enlarged, symmetric, no tenderness/mass/nodules Lymph nodes: Cervical, supraclavicular, and axillary  nodes normal. Resp: clear to auscultation bilaterally Back: symmetric, no curvature. ROM normal. No CVA tenderness. Cardio: regular rate and rhythm, S1, S2 normal, no murmur, click, rub or gallop GI: soft, non-tender; bowel sounds normal; no masses,  no organomegaly Extremities: extremities normal, atraumatic, no cyanosis or edema Neurologic: Alert and oriented X 3, normal strength and tone. Normal symmetric reflexes. Normal coordination and gait  ECOG PERFORMANCE STATUS: 1 - Symptomatic but completely ambulatory  Blood pressure (!) 166/83, pulse 65, temperature 97.8 F (36.6 C), temperature source Oral, resp. rate 20, weight 197 lb 1.6 oz (89.4 kg), SpO2 99 %.  LABORATORY DATA: Lab Results  Component Value Date   WBC 9.6 09/25/2018   HGB 14.7 09/25/2018   HCT 47.5 09/25/2018   MCV 87.0 09/25/2018   PLT 279 09/25/2018      Chemistry      Component Value Date/Time   NA 140 09/25/2018 0832   NA 136 04/09/2017 1249   K 4.6 09/25/2018 0832   K 4.6 04/09/2017 1249   CL 103 09/25/2018 0832   CO2 25 09/25/2018 0832   CO2 27 04/09/2017 1249   BUN 13 09/25/2018 0832   BUN 23.9 04/09/2017 1249   CREATININE 1.05 09/25/2018 0832   CREATININE 1.4 (H) 04/09/2017 1249      Component Value Date/Time   CALCIUM 9.5 09/25/2018 0832   CALCIUM 9.6 04/09/2017 1249   ALKPHOS 61 09/25/2018 0832   ALKPHOS 58 04/09/2017 1249   AST 16 09/25/2018 0832   AST 13 04/09/2017 1249   ALT 14 09/25/2018 0832   ALT 9 04/09/2017 1249   BILITOT 0.4 09/25/2018 0832   BILITOT 1.01 04/09/2017 1249       RADIOGRAPHIC STUDIES: Ct Chest W Contrast  Result Date: 09/25/2018 CLINICAL DATA:  Stage I left upper lobe lung adenocarcinoma status post left upper lobectomy 08/01/2016. restaging. EXAM: CT CHEST WITH CONTRAST TECHNIQUE: Multidetector CT imaging of the chest was performed during intravenous contrast administration. CONTRAST:  9mL OMNIPAQUE IOHEXOL 300 MG/ML  SOLN COMPARISON:  03/24/2018 chest CT.  FINDINGS: Cardiovascular: Normal heart size. No significant pericardial effusion/thickening. Atherosclerotic nonaneurysmal thoracic aorta. Normal caliber pulmonary arteries. No central pulmonary emboli. Mediastinum/Nodes: No discrete thyroid nodules. Unremarkable esophagus. No pathologically enlarged axillary, mediastinal or hilar lymph nodes. Lungs/Pleura: No pneumothorax. No pleural effusion. Status post left upper lobectomy. Moderate centrilobular and severe bullous paraseptal emphysema with mild diffuse bronchial wall thickening. No acute consolidative airspace disease or lung masses. Posterior right upper lobe solid 9 mm pulmonary nodule (series 5/image 65), increased from 7 mm on 03/24/2018 and 4 mm on 04/09/2017 chest CT studies. No additional significant pulmonary nodules. Upper abdomen: Small hiatal hernia. Musculoskeletal: No aggressive appearing focal osseous lesions. Mild thoracic spondylosis. IMPRESSION: 1. Interval growth of solid posterior right upper lobe pulmonary nodule, now 9 mm, most suggestive of an enlarging malignant nodule, either a contralateral pulmonary metastasis or metachronous primary bronchogenic carcinoma. 2. No thoracic adenopathy or additional sites of metastatic disease in the chest. Aortic Atherosclerosis (ICD10-I70.0) and Emphysema (ICD10-J43.9). Electronically Signed   By: Ilona Sorrel M.D.   On: 09/25/2018 11:19    ASSESSMENT AND PLAN: This is a very pleasant 68 years old white male with a stage IA non-small cell lung cancer status post left lower lobectomy with lymph node dissection under the care of Dr. Roxan Hockey in November 2017 and has  been observation since that time. The patient is doing fine today with no concerning complaints except for mild shortness of breath. He had repeat CT scan of the chest performed recently.  Unfortunately the scan showed interval increase in the size of solid posterior right upper lobe pulmonary nodule which currently measures 0.9 cm  concerning for contralateral pulmonary metastasis or metachronous primary bronchogenic carcinoma. I personally and independently reviewed the scan images and discussed the result and showed the images to the patient today. I recommended for the patient to have a PET scan for further evaluation of this nodule. If the PET scan is positive will consider either referring the patient to Dr. Roxan Hockey for resection or stereotactic radiotherapy if the patient is not a good surgical candidate. For hypertension I strongly encouraged the patient to take his blood pressure medication as prescribed and to call his primary care physician for adjustment of the medication if needed. I will see him back for follow-up visit in 3 weeks for evaluation and discussion of the PET scan results and further recommendation regarding his condition. He was advised to call immediately if he has any concerning symptoms in the interval. The patient voices understanding of current disease status and treatment options and is in agreement with the current care plan. All questions were answered. The patient knows to call the clinic with any problems, questions or concerns. We can certainly see the patient much sooner if necessary.  Disclaimer: This note was dictated with voice recognition software. Similar sounding words can inadvertently be transcribed and may not be corrected upon review.

## 2018-10-01 NOTE — Telephone Encounter (Signed)
Printed calendar and avs. °

## 2018-10-03 ENCOUNTER — Encounter: Payer: Self-pay | Admitting: Internal Medicine

## 2018-10-15 ENCOUNTER — Encounter (HOSPITAL_COMMUNITY)
Admission: RE | Admit: 2018-10-15 | Discharge: 2018-10-15 | Disposition: A | Discharge status: Home / Self Care | Payer: Medicare Other | Source: Ambulatory Visit | Attending: Internal Medicine | Admitting: Internal Medicine

## 2018-10-15 DIAGNOSIS — C3490 Malignant neoplasm of unspecified part of unspecified bronchus or lung: Secondary | ICD-10-CM | POA: Insufficient documentation

## 2018-10-15 LAB — GLUCOSE, CAPILLARY: Glucose-Capillary: 99 mg/dL (ref 70–99)

## 2018-10-15 MED ORDER — FLUDEOXYGLUCOSE F - 18 (FDG) INJECTION
11.0000 | Freq: Once | INTRAVENOUS | Status: AC
  Administered 2018-10-15: 11 via INTRAVENOUS

## 2018-10-19 ENCOUNTER — Telehealth: Payer: Self-pay | Admitting: Internal Medicine

## 2018-10-19 NOTE — Telephone Encounter (Signed)
Called and patients wife decide to keep 1/31

## 2018-10-30 ENCOUNTER — Inpatient Hospital Stay (HOSPITAL_BASED_OUTPATIENT_CLINIC_OR_DEPARTMENT_OTHER): Payer: Medicare Other | Admitting: Internal Medicine

## 2018-10-30 ENCOUNTER — Telehealth: Payer: Self-pay | Admitting: Internal Medicine

## 2018-10-30 ENCOUNTER — Encounter: Payer: Self-pay | Admitting: Internal Medicine

## 2018-10-30 VITALS — BP 167/93 | HR 62 | Temp 98.6°F | Resp 17 | Ht 70.0 in | Wt 194.4 lb

## 2018-10-30 DIAGNOSIS — M129 Arthropathy, unspecified: Secondary | ICD-10-CM

## 2018-10-30 DIAGNOSIS — Z7982 Long term (current) use of aspirin: Secondary | ICD-10-CM

## 2018-10-30 DIAGNOSIS — F17210 Nicotine dependence, cigarettes, uncomplicated: Secondary | ICD-10-CM

## 2018-10-30 DIAGNOSIS — C3490 Malignant neoplasm of unspecified part of unspecified bronchus or lung: Secondary | ICD-10-CM

## 2018-10-30 DIAGNOSIS — C3412 Malignant neoplasm of upper lobe, left bronchus or lung: Secondary | ICD-10-CM | POA: Diagnosis not present

## 2018-10-30 DIAGNOSIS — C3492 Malignant neoplasm of unspecified part of left bronchus or lung: Secondary | ICD-10-CM

## 2018-10-30 DIAGNOSIS — I10 Essential (primary) hypertension: Secondary | ICD-10-CM | POA: Diagnosis not present

## 2018-10-30 DIAGNOSIS — J439 Emphysema, unspecified: Secondary | ICD-10-CM | POA: Diagnosis not present

## 2018-10-30 DIAGNOSIS — I714 Abdominal aortic aneurysm, without rupture: Secondary | ICD-10-CM

## 2018-10-30 DIAGNOSIS — Z79899 Other long term (current) drug therapy: Secondary | ICD-10-CM

## 2018-10-30 DIAGNOSIS — R5383 Other fatigue: Secondary | ICD-10-CM

## 2018-10-30 DIAGNOSIS — Z902 Acquired absence of lung [part of]: Secondary | ICD-10-CM

## 2018-10-30 DIAGNOSIS — I2510 Atherosclerotic heart disease of native coronary artery without angina pectoris: Secondary | ICD-10-CM | POA: Diagnosis not present

## 2018-10-30 DIAGNOSIS — M1990 Unspecified osteoarthritis, unspecified site: Secondary | ICD-10-CM

## 2018-10-30 NOTE — Progress Notes (Signed)
[facility] Telephone:([phone]) [phone]   Fax:([phone]  OFFICE PROGRESS NOTE  Brigitte Pulse, DO [place] [address]  DIAGNOSIS: Stage IA (T1b, N0, M0) non-small cell lung cancer, well-differentiated adenocarcinoma presented with left upper lobeS pulmonary nodule diagnosed in September 2017.  PRIOR THERAPY: Status post left VATS, left upper lobectomy with mediastinal lymph node dissection under the care of Dr. Roxan Hockey on 08/01/2016.  CURRENT THERAPY: Observation.  INTERVAL HISTORY: Randall Sullivan 68 y.o. male returns to the clinic today for follow-up visit accompanied by his wife.  The patient is feeling fine today with no concerning complaints except for occasional pain on the right chest wall.  He denied having any shortness of breath, cough or hemoptysis.  He denied having any fever or chills.  He has no nausea, vomiting, diarrhea or constipation.  He denied having any headache or visual changes.  He was found on previous CT scan of the chest to have his suspicious nodule and the right lung.  I ordered a PET scan which was performed recently and the patient is here today for evaluation and discussion of his PET scan results and treatment options.   MEDICAL HISTORY: Past Medical History:  Diagnosis Date  . Arthritis    back & legs ( knees)  . Complication of anesthesia    agitated upon waking from anesth.   . Emphysema   . History of lung cancer   . Hypertension   . Tobacco abuse     ALLERGIES:  is allergic to no known allergies.  MEDICATIONS:  Current Outpatient Medications  Medication Sig Dispense Refill  . acetaminophen (TYLENOL) 500 MG tablet Take 1,000 mg by mouth every 6 (six) hours as needed (for arthritis pain.).     [name] albuterol (PROVENTIL) (2.5 MG/3ML) 0.083% nebulizer solution USE 1 VIAL PER NEBULIZATION FOUR TIMES DAILY AS NEEDED FOR SHORTNESS OF BREATH  5  . aspirin EC 81 MG tablet Take 81 mg by mouth daily.    .  fexofenadine (ALLEGRA) 180 MG tablet Take 180 mg by mouth daily as needed for allergies or rhinitis.    . fluticasone furoate-vilanterol (BREO ELLIPTA) 100-25 MCG/INH AEPB Inhale 1 puff into the lungs daily. 1 each 0  . Fluticasone-Salmeterol (AIRDUO RESPICLICK 384/66) 599-35 MCG/ACT AEPB Inhale 1 puff into the lungs 2 (two) times daily. (Patient not taking: Reported on 12/09/2017) 1 each 5  . meclizine (ANTIVERT) 25 MG tablet Take 1 tablet (25 mg total) by mouth 3 (three) times daily as needed for dizziness. May cause drowsiness 30 tablet 0  . metoprolol tartrate (LOPRESSOR) 50 MG tablet Take by mouth.     No current facility-administered medications for this visit.     SURGICAL HISTORY:  Past Surgical History:  Procedure Laterality Date  . HERNIA REPAIR Bilateral 7017   umbilical  . VIDEO ASSISTED THORACOSCOPY (VATS)/ LOBECTOMY Left 08/01/2016   Procedure: VIDEO ASSISTED THORACOSCOPY (VATS)/ LEFT UPPER LOBECTOMY with lymph node sampling and onQ placement;  Surgeon: [name], MD;  Location: [facility];  Service: Thoracic;  Laterality: Left;  [name] VIDEO BRONCHOSCOPY WITH ENDOBRONCHIAL NAVIGATION N/A 07/03/2016   Procedure: VIDEO BRONCHOSCOPY WITH ENDOBRONCHIAL NAVIGATION;  Surgeon: [name], MD;  Location: [facility];  Service: Thoracic;  Laterality: N/A;    REVIEW OF SYSTEMS:  Constitutional: positive for fatigue Eyes: negative Ears, nose, mouth, throat, and face: negative Respiratory: positive for pleurisy/chest pain Cardiovascular: negative Gastrointestinal: negative Genitourinary:negative Integument/breast: negative Hematologic/lymphatic: negative Musculoskeletal:negative Neurological: negative  Behavioral/Psych: negative Endocrine: negative Allergic/Immunologic: negative   PHYSICAL EXAMINATION: General appearance: alert, cooperative and no distress Head: Normocephalic, without obvious abnormality, atraumatic Neck: no adenopathy, no JVD, supple, symmetrical, trachea  midline and thyroid not enlarged, symmetric, no tenderness/mass/nodules Lymph nodes: Cervical, supraclavicular, and axillary nodes normal. Resp: clear to auscultation bilaterally Back: symmetric, no curvature. ROM normal. No CVA tenderness. Cardio: regular rate and rhythm, S1, S2 normal, no murmur, click, rub or gallop GI: soft, non-tender; bowel sounds normal; no masses,  no organomegaly Extremities: extremities normal, atraumatic, no cyanosis or edema Neurologic: Alert and oriented X 3, normal strength and tone. Normal symmetric reflexes. Normal coordination and gait  ECOG PERFORMANCE STATUS: 1 - Symptomatic but completely ambulatory  Blood pressure (!) 167/93, pulse 62, temperature 98.6 F (37 C), resp. rate 17, height 5\' 10"  (1.778 m), weight 194 lb 6.4 oz (88.2 kg), SpO2 97 %.  LABORATORY DATA: Lab Results  Component Value Date   WBC 9.6 09/25/2018   HGB 14.7 09/25/2018   HCT 47.5 09/25/2018   MCV 87.0 09/25/2018   PLT 279 09/25/2018      Chemistry      Component Value Date/Time   NA 140 09/25/2018 0832   NA 136 04/09/2017 1249   K 4.6 09/25/2018 0832   K 4.6 04/09/2017 1249   CL 103 09/25/2018 0832   CO2 25 09/25/2018 0832   CO2 27 04/09/2017 1249   BUN 13 09/25/2018 0832   BUN 23.9 04/09/2017 1249   CREATININE 1.05 09/25/2018 0832   CREATININE 1.4 (H) 04/09/2017 1249      Component Value Date/Time   CALCIUM 9.5 09/25/2018 0832   CALCIUM 9.6 04/09/2017 1249   ALKPHOS 61 09/25/2018 0832   ALKPHOS 58 04/09/2017 1249   AST 16 09/25/2018 0832   AST 13 04/09/2017 1249   ALT 14 09/25/2018 0832   ALT 9 04/09/2017 1249   BILITOT 0.4 09/25/2018 0832   BILITOT 1.01 04/09/2017 1249       RADIOGRAPHIC STUDIES: Nm Pet Image Restag (ps) Skull Base To Thigh  Result Date: 10/15/2018 CLINICAL DATA:  Subsequent treatment strategy for non-small cell lung cancer. EXAM: NUCLEAR MEDICINE PET SKULL BASE TO THIGH TECHNIQUE: 11.0 mCi F-18 FDG was injected intravenously.  Full-ring PET imaging was performed from the skull base to thigh after the radiotracer. CT data was obtained and used for attenuation correction and anatomic localization. Fasting blood glucose: 99 mg/dl COMPARISON:  Multiple exams, including PET-CT from 06/19/2016 and chest CT from 03/24/2018 FINDINGS: Mediastinal blood pool activity: SUV max 2.5 NECK: Symmetric palatine tonsillar activity, likely physiologic. Activity in the glottis is slightly eccentric to the left but without appreciable asymmetry on the CT data, likely physiologic as well. Incidental CT findings: Left common carotid atherosclerotic calcification. CHEST: A 0.8 by 0.5 cm right upper lobe pulmonary nodule on image 75/4 has a maximum SUV of 1.9. Scarring medially in the right middle lobe with only very faint associated activity, not appreciably changed from 06/19/2016. Prior left upper lobectomy. Incidental CT findings: Severe emphysema. Atherosclerotic calcification of the aortic arch and the circumflex coronary artery. ABDOMEN/PELVIS: No significant abnormal hypermetabolic activity in this region. Incidental CT findings: Aortoiliac atherosclerotic vascular disease. Infrarenal abdominal aortic aneurysm 3.6 cm transverse, previously 3.4 cm on 06/19/2016. SKELETON: No significant abnormal hypermetabolic activity in this region. Incidental CT findings: none IMPRESSION: 1. The 8 by 5 mm right upper lobe pulmonary nodule is focally hypermetabolic. Although the SUV is only 1.9, this nodule is at the lower size  limits for sensitive assessment by PET-CT, and accordingly even at this lower SUV concern is raised for the possibility of malignancy. The lesion has also increased in size compared to 04/09/2017, suspicious for low-grade malignancy. I am skeptical that percutaneous biopsy would be feasible, and wedge resection likewise is potentially on feasible given the obviously low respiratory reserve based on the degree of emphysema and prior left upper  lobectomy. Possibilities might include further surveillance for presumptive treatment with radiation therapy. This case might benefit from discussion at the multidisciplinary thoracic oncology conference. 2. Infrarenal abdominal aortic aneurysm, 3.6 cm in diameter. Recommend followup by [place] in 2 years. This recommendation follows ACR consensus guidelines: White Paper of the ACR Incidental Findings Committee II on Vascular Findings. J Am Coll Radiol 2013; 10:789-794. 3. Other imaging findings of potential clinical significance: Aortic Atherosclerosis (ICD10-I70.0) and Emphysema (ICD10-J43.9). Coronary atherosclerosis. Left upper lobectomy. Electronically Signed   By: Van Clines M.D.   On: 10/15/2018 14:05    ASSESSMENT AND PLAN: This is a very pleasant 68 years old white male with a stage IA non-small cell lung cancer status post left lower lobectomy with lymph node dissection under the care of Dr. Roxan Hockey in November 2017 and has been observation since that time. The patient is doing fine today with no concerning complaints except for mild shortness of breath. He had repeat CT scan of the chest performed recently.  Unfortunately the scan showed interval increase in the size of solid posterior right upper lobe pulmonary nodule which currently measures 0.9 cm concerning for contralateral pulmonary metastasis or metachronous primary bronchogenic carcinoma. I ordered a PET scan which was performed recently.  I personally and independently reviewed the scan images and discussed the results with the patient and his wife today. The PET scan showed mild activity in the right upper lobe pulmonary nodule but still suspicious for neoplasm taking into consideration the increase in size over the last few months. I had a lengthy discussion with the patient and his wife about his condition and treatment options.  I discussed with the patient the option of continuous observation and close monitoring versus  referral to Dr. Roxan Hockey for reevaluation of surgical resection which probably is unlikely with his emphysema versus stereotactic radiotherapy.  After the discussion the patient would like to meet with Dr. Roxan Hockey for reevaluation before proceeding with radiotherapy. I will arrange for the patient to have a consultation with Dr. Roxan Hockey before referral to radiation oncology. I will see the patient back for follow-up visit in 3 months for evaluation with repeat CT scan of the chest for restaging of his disease. For hypertension I strongly encouraged the patient to take his blood pressure medication as prescribed and to discuss with his primary care physician adjustment of his medication if needed. The patient was advised to call immediately if he has any concerning symptoms in the interval. The patient voices understanding of current disease status and treatment options and is in agreement with the current care plan. All questions were answered. The patient knows to call the clinic with any problems, questions or concerns. We can certainly see the patient much sooner if necessary.  Disclaimer: This note was dictated with voice recognition software. Similar sounding words can inadvertently be transcribed and may not be corrected upon review.

## 2018-10-30 NOTE — Telephone Encounter (Signed)
Scheduled appt per 01/31 los. ° °Printed calendar and avs. °

## 2018-11-05 ENCOUNTER — Telehealth: Payer: Self-pay | Admitting: *Deleted

## 2018-11-05 NOTE — Telephone Encounter (Signed)
Oncology Nurse Navigator Documentation  Oncology Nurse Navigator Flowsheets 11/05/2018  Navigator Location [facility]  Navigator Encounter Type Telephone/per Dr. Julien Nordmann, I called Randall Sullivan to schedule him to be seen at thoracic clinic next week to see Dr. Roxan Hockey.  He verbalized understand of appt time and place.   Telephone Outgoing Call  Treatment Phase Abnormal Scans  Barriers/Navigation Needs Education;Coordination of Care  Education Other  Interventions Coordination of Care;Education  Coordination of Care Appts  Education Method Verbal  Acuity Level 1  Time Spent with Patient 15

## 2018-11-12 ENCOUNTER — Encounter: Payer: Self-pay | Admitting: *Deleted

## 2018-11-12 ENCOUNTER — Institutional Professional Consult (permissible substitution) (INDEPENDENT_AMBULATORY_CARE_PROVIDER_SITE_OTHER): Payer: Medicare Other | Admitting: Thoracic Surgery (Cardiothoracic Vascular Surgery)

## 2018-11-12 ENCOUNTER — Other Ambulatory Visit: Payer: Self-pay | Admitting: *Deleted

## 2018-11-12 ENCOUNTER — Encounter: Payer: Self-pay | Admitting: Thoracic Surgery (Cardiothoracic Vascular Surgery)

## 2018-11-12 VITALS — BP 163/91 | HR 68 | Temp 97.7°F | Resp 17 | Wt 196.2 lb

## 2018-11-12 DIAGNOSIS — R911 Solitary pulmonary nodule: Secondary | ICD-10-CM

## 2018-11-12 NOTE — Progress Notes (Signed)
Oncology Nurse Navigator Documentation  Oncology Nurse Navigator Flowsheets 11/12/2018  Navigator Location [facility]  Navigator Encounter Type Clinic/MDC/I spoke with patient and family today after they saw Dr. Roxan Hockey.  Patient would like to think about his next steps before committing to a treatment plan.  He will call with an update.   Abnormal Finding Date 09/25/2018  Multidisiplinary Clinic Date 11/12/2018  Treatment Phase Abnormal Scans  Barriers/Navigation Needs Education  Education Other  Interventions Education  Education Method Verbal  Acuity Level 1  Time Spent with Patient 30

## 2018-11-12 NOTE — Progress Notes (Signed)
The proposed treatment discussed in cancer conference 11/12/2018 is for discussion purpose only and is not a binding recommendation.  The patient was not examined nor present for their treatment options.  Therefore, final treatment plans cannot be decided.

## 2018-11-12 NOTE — H&P (View-Only) (Signed)
PCP is Brigitte Pulse, DO Referring Provider is Curt Bears, MD  No chief complaint on file.   HPI: Randall Sullivan is sent for consultation re: a right upper lobe lung nodule  Randall Sullivan is a 68 yo man with a past history of tobacco abuse, COPD, stage IA lung cancer, hypertension, paroxsymal atrial fbrillation and arthritis. He had a left upper lobectomy in 2017 for a stage IA non-small cell carcinoma. Has been followed since that time. A CT in December showed a new RUL lung nodule. A PET showed it was hypermetabolic with an SUV of 1.9.  He feels well. He has not had any recent respiratory issues and says he thinks his breathing is better than it was prior to surgery. He did quit smoking around the same time. No change in appetite or weight loss. No chest pain, tightness or pressure with exertion.  Zubrod Score: At the time of surgery this patient's most appropriate activity status/level should be described as: []     0    Normal activity, no symptoms [x]     1    Restricted in physical strenuous activity but ambulatory, able to do out light work []     2    Ambulatory and capable of self care, unable to do work activities, up and about >50 % of waking hours                              []     3    Only limited self care, in bed greater than 50% of waking hours []     4    Completely disabled, no self care, confined to bed or chair []     5    Moribund  Past Medical History:  Diagnosis Date  . Arthritis    back & legs ( knees)  . Complication of anesthesia    agitated upon waking from anesth.   . Emphysema   . History of lung cancer   . Hypertension   . Tobacco abuse     Past Surgical History:  Procedure Laterality Date  . HERNIA REPAIR Bilateral 5427   umbilical  . VIDEO ASSISTED THORACOSCOPY (VATS)/ LOBECTOMY Left 08/01/2016   Procedure: VIDEO ASSISTED THORACOSCOPY (VATS)/ LEFT UPPER LOBECTOMY with lymph node sampling and onQ placement;  Surgeon: [name], MD;   Location: [facility];  Service: Thoracic;  Laterality: Left;  [name] VIDEO BRONCHOSCOPY WITH ENDOBRONCHIAL NAVIGATION N/A 07/03/2016   Procedure: VIDEO BRONCHOSCOPY WITH ENDOBRONCHIAL NAVIGATION;  Surgeon: [name], MD;  Location: [facility];  Service: Thoracic;  Laterality: N/A;    Family History  Problem Relation Age of Onset  . Cancer Mother   . Cancer Sister   . Aneurysm Sister   . Kidney disease Paternal Aunt   . Asthma Brother   . Cancer - Lung Brother     Social History Social History   Tobacco Use  . Smoking status: Former Smoker    Packs/day: 1.00    Years: 40.00    Pack years: 40.00    Types: Cigarettes    Last attempt to quit: 05/28/2016    Years since quitting: 2.4  . Smokeless tobacco: Never Used  Substance Use Topics  . Alcohol use: No    Comment: quit- 2016  . Drug use: No    Current Outpatient Medications  Medication Sig Dispense Refill  . acetaminophen (TYLENOL) 500 MG tablet Take 1,000 mg by mouth every 6 (  six) hours as needed (for arthritis pain.).     [name] albuterol (PROVENTIL) (2.5 MG/3ML) 0.083% nebulizer solution USE 1 VIAL PER NEBULIZATION FOUR TIMES DAILY AS NEEDED FOR SHORTNESS OF BREATH  5  . aspirin EC 81 MG tablet Take 81 mg by mouth daily.    . fexofenadine (ALLEGRA) 180 MG tablet Take 180 mg by mouth daily as needed for allergies or rhinitis.    . fluticasone furoate-vilanterol (BREO ELLIPTA) 100-25 MCG/INH AEPB Inhale 1 puff into the lungs daily. 1 each 0  . Fluticasone-Salmeterol (AIRDUO RESPICLICK 867/67) 209-47 MCG/ACT AEPB Inhale 1 puff into the lungs 2 (two) times daily. (Patient not taking: Reported on 12/09/2017) 1 each 5  . meclizine (ANTIVERT) 25 MG tablet Take 1 tablet (25 mg total) by mouth 3 (three) times daily as needed for dizziness. May cause drowsiness 30 tablet 0  . metoprolol tartrate (LOPRESSOR) 50 MG tablet Take by mouth.     No current facility-administered medications for this visit.     Allergies  Allergen Reactions  . No  Known Allergies     Review of Systems  Constitutional: Negative for activity change, fever and unexpected weight change.  HENT: Negative for trouble swallowing and voice change.   Eyes: Negative for visual disturbance.  Respiratory: Positive for cough, shortness of breath and wheezing.   Cardiovascular: Negative for chest pain and leg swelling.  Genitourinary: Negative for difficulty urinating and dysuria.  Musculoskeletal: Positive for arthralgias and joint swelling.  Neurological: Negative for seizures, weakness and numbness.  Hematological: Negative for adenopathy. Does not bruise/bleed easily.  All other systems reviewed and are negative.   BP (!) 163/91   Pulse 68   Temp 97.7 F (36.5 C)   Resp 17   Wt 196 lb 3.2 oz (89 kg)   SpO2 96%   BMI 28.15 kg/m  Physical Exam Vitals signs reviewed.  Constitutional:      General: He is not in acute distress.    Appearance: Normal appearance.  HENT:     Head: Normocephalic and atraumatic.     Mouth/Throat:     Pharynx: Oropharynx is clear.  Eyes:     General: No scleral icterus.    Extraocular Movements: Extraocular movements intact.     Conjunctiva/sclera: Conjunctivae normal.     Pupils: Pupils are equal, round, and reactive to light.  Neck:     Musculoskeletal: Neck supple.  Cardiovascular:     Rate and Rhythm: Normal rate and regular rhythm.     Pulses: Normal pulses.     Heart sounds: Normal heart sounds. No murmur. No friction rub. No gallop.   Pulmonary:     Effort: Pulmonary effort is normal. No respiratory distress.     Breath sounds: Wheezing (faint at bases) present.  Abdominal:     General: There is no distension.     Palpations: Abdomen is soft.     Tenderness: There is no abdominal tenderness.  Musculoskeletal:        General: No swelling.  Lymphadenopathy:     Cervical: No cervical adenopathy.  Skin:    General: Skin is warm and dry.  Neurological:     General: No focal deficit present.     Mental  Status: He is alert and oriented to person, place, and time.     Motor: No weakness.    Diagnostic Tests: CT CHEST WITH CONTRAST  TECHNIQUE: Multidetector CT imaging of the chest was performed during intravenous contrast administration.  CONTRAST:  63mL OMNIPAQUE  IOHEXOL 300 MG/ML  SOLN  COMPARISON:  03/24/2018 chest CT.  FINDINGS: Cardiovascular: Normal heart size. No significant pericardial effusion/thickening. Atherosclerotic nonaneurysmal thoracic aorta. Normal caliber pulmonary arteries. No central pulmonary emboli.  Mediastinum/Nodes: No discrete thyroid nodules. Unremarkable esophagus. No pathologically enlarged axillary, mediastinal or hilar lymph nodes.  Lungs/Pleura: No pneumothorax. No pleural effusion. Status post left upper lobectomy. Moderate centrilobular and severe bullous paraseptal emphysema with mild diffuse bronchial wall thickening. No acute consolidative airspace disease or lung masses. Posterior right upper lobe solid 9 mm pulmonary nodule (series 5/image 65), increased from 7 mm on 03/24/2018 and 4 mm on 04/09/2017 chest CT studies. No additional significant pulmonary nodules.  Upper abdomen: Small hiatal hernia.  Musculoskeletal: No aggressive appearing focal osseous lesions. Mild thoracic spondylosis.  IMPRESSION: 1. Interval growth of solid posterior right upper lobe pulmonary nodule, now 9 mm, most suggestive of an enlarging malignant nodule, either a contralateral pulmonary metastasis or metachronous primary bronchogenic carcinoma. 2. No thoracic adenopathy or additional sites of metastatic disease in the chest.  Aortic Atherosclerosis (ICD10-I70.0) and Emphysema (ICD10-J43.9).   Electronically Signed   By: Ilona Sorrel M.D.   On: 09/25/2018 11:19 NUCLEAR MEDICINE PET SKULL BASE TO THIGH  TECHNIQUE: 11.0 mCi F-18 FDG was injected intravenously. Full-ring PET imaging was performed from the skull base to thigh after the  radiotracer. CT data was obtained and used for attenuation correction and anatomic localization.  Fasting blood glucose: 99 mg/dl  COMPARISON:  Multiple exams, including PET-CT from 06/19/2016 and chest CT from 03/24/2018  FINDINGS: Mediastinal blood pool activity: SUV max 2.5  NECK: Symmetric palatine tonsillar activity, likely physiologic. Activity in the glottis is slightly eccentric to the left but without appreciable asymmetry on the CT data, likely physiologic as well.  Incidental CT findings: Left common carotid atherosclerotic calcification.  CHEST: A 0.8 by 0.5 cm right upper lobe pulmonary nodule on image 75/4 has a maximum SUV of 1.9.  Scarring medially in the right middle lobe with only very faint associated activity, not appreciably changed from 06/19/2016.  Prior left upper lobectomy.  Incidental CT findings: Severe emphysema. Atherosclerotic calcification of the aortic arch and the circumflex coronary artery.  ABDOMEN/PELVIS: No significant abnormal hypermetabolic activity in this region.  Incidental CT findings: Aortoiliac atherosclerotic vascular disease. Infrarenal abdominal aortic aneurysm 3.6 cm transverse, previously 3.4 cm on 06/19/2016.  SKELETON: No significant abnormal hypermetabolic activity in this region.  Incidental CT findings: none  IMPRESSION: 1. The 8 by 5 mm right upper lobe pulmonary nodule is focally hypermetabolic. Although the SUV is only 1.9, this nodule is at the lower size limits for sensitive assessment by PET-CT, and accordingly even at this lower SUV concern is raised for the possibility of malignancy. The lesion has also increased in size compared to 04/09/2017, suspicious for low-grade malignancy. I am skeptical that percutaneous biopsy would be feasible, and wedge resection likewise is potentially on feasible given the obviously low respiratory reserve based on the degree of emphysema and prior left  upper lobectomy. Possibilities might include further surveillance for presumptive treatment with radiation therapy. This case might benefit from discussion at the multidisciplinary thoracic oncology conference. 2. Infrarenal abdominal aortic aneurysm, 3.6 cm in diameter. Recommend followup by [place] in 2 years. This recommendation follows ACR consensus guidelines: White Paper of the ACR Incidental Findings Committee II on Vascular Findings. J Am Coll Radiol 2013; 10:789-794. 3. Other imaging findings of potential clinical significance: Aortic Atherosclerosis (ICD10-I70.0) and Emphysema (ICD10-J43.9). Coronary atherosclerosis. Left upper lobectomy.  Electronically Signed   By: Van Clines M.D.   On: 10/15/2018 14:05 I personally reviewed the CT and PET CT images and they were discussed as a group in our [place] conference  Impression: Randall Sullivan is a 68 yo man with a history of tobacco abuse, COPD, lung cancer, paroxysmal atrial fib, hypertension and arthritis. I did a left upper lobectomy in 2017 for a stage IA non-small cell carcinoma. He now has a new 8-9 mm nodule in the posterior aspect of his right upper lobe. Highly suspicious for a new primary bronchogenic carcinoma. Recurrence or non malignant etiologies (granuloma) are also in the differential diagnosis.  This nodule is near the fissure and in an area with significant emphysematous blebs. It is not favorable for bronchoscopic or CT guided biopsy.  I discussed several options with Randall Sullivan and his family. One would radiographic observation. Neither I nor they are in favor of that approach. We discussed surgical resection (wedge) v stereotactic radiation. They understand the relative advantages and disadvantages of each.  We did discuss the potential surgery- Right VATS with wedge resection. They are familiar with the approach since he had a left upper lobectomy previously. I informed them of the indications, risks, benefits and  alternatives. I have discussed with the patient the general nature of the procedure, need for general anesthesia,and incisions to be used. They understand the risks include, but are not limited to death, stroke, MI, DVT/PE, bleeding, possible need for transfusion, infections, prolonged air leaks, cardiac arrhythmias, as well as other organ system dysfunction including respiratory, renal, or GI complications.   He wishes to think about it and let us know how he would like to proceed.  I did offer to arrange a consultation with Radiation Oncology to discuss SBRT if he would like.  Plan: Right VATS, wedge resection. Patient will call to schedule if he wishes to proceed  [name], MD [facility] [phone]

## 2018-11-12 NOTE — Progress Notes (Signed)
PCP is Brigitte Pulse, DO Referring Provider is Curt Bears, MD  No chief complaint on file.   HPI: Randall Sullivan is sent for consultation re: a right upper lobe lung nodule  Randall Sullivan is a 68 yo man with a past history of tobacco abuse, COPD, stage IA lung cancer, hypertension, paroxsymal atrial fbrillation and arthritis. He had a left upper lobectomy in 2017 for a stage IA non-small cell carcinoma. Has been followed since that time. A CT in December showed a new RUL lung nodule. A PET showed it was hypermetabolic with an SUV of 1.9.  He feels well. He has not had any recent respiratory issues and says he thinks his breathing is better than it was prior to surgery. He did quit smoking around the same time. No change in appetite or weight loss. No chest pain, tightness or pressure with exertion.  Zubrod Score: At the time of surgery this patient's most appropriate activity status/level should be described as: []     0    Normal activity, no symptoms [x]     1    Restricted in physical strenuous activity but ambulatory, able to do out light work []     2    Ambulatory and capable of self care, unable to do work activities, up and about >50 % of waking hours                              []     3    Only limited self care, in bed greater than 50% of waking hours []     4    Completely disabled, no self care, confined to bed or chair []     5    Moribund  Past Medical History:  Diagnosis Date  . Arthritis    back & legs ( knees)  . Complication of anesthesia    agitated upon waking from anesth.   . Emphysema   . History of lung cancer   . Hypertension   . Tobacco abuse     Past Surgical History:  Procedure Laterality Date  . HERNIA REPAIR Bilateral 1610   umbilical  . VIDEO ASSISTED THORACOSCOPY (VATS)/ LOBECTOMY Left 08/01/2016   Procedure: VIDEO ASSISTED THORACOSCOPY (VATS)/ LEFT UPPER LOBECTOMY with lymph node sampling and onQ placement;  Surgeon: [name], MD;   Location: [facility];  Service: Thoracic;  Laterality: Left;  [name] VIDEO BRONCHOSCOPY WITH ENDOBRONCHIAL NAVIGATION N/A 07/03/2016   Procedure: VIDEO BRONCHOSCOPY WITH ENDOBRONCHIAL NAVIGATION;  Surgeon: [name], MD;  Location: [facility] OR;  Service: Thoracic;  Laterality: N/A;    Family History  Problem Relation Age of Onset  . Cancer Mother   . Cancer Sister   . Aneurysm Sister   . Kidney disease Paternal Aunt   . Asthma Brother   . Cancer - Lung Brother     Social History Social History   Tobacco Use  . Smoking status: Former Smoker    Packs/day: 1.00    Years: 40.00    Pack years: 40.00    Types: Cigarettes    Last attempt to quit: 05/28/2016    Years since quitting: 2.4  . Smokeless tobacco: Never Used  Substance Use Topics  . Alcohol use: No    Comment: quit- 2016  . Drug use: No    Current Outpatient Medications  Medication Sig Dispense Refill  . acetaminophen (TYLENOL) 500 MG tablet Take 1,000 mg by mouth every 6 (  six) hours as needed (for arthritis pain.).     [name] albuterol (PROVENTIL) (2.5 MG/3ML) 0.083% nebulizer solution USE 1 VIAL PER NEBULIZATION FOUR TIMES DAILY AS NEEDED FOR SHORTNESS OF BREATH  5  . aspirin EC 81 MG tablet Take 81 mg by mouth daily.    . fexofenadine (ALLEGRA) 180 MG tablet Take 180 mg by mouth daily as needed for allergies or rhinitis.    . fluticasone furoate-vilanterol (BREO ELLIPTA) 100-25 MCG/INH AEPB Inhale 1 puff into the lungs daily. 1 each 0  . Fluticasone-Salmeterol (AIRDUO RESPICLICK 026/37) 858-85 MCG/ACT AEPB Inhale 1 puff into the lungs 2 (two) times daily. (Patient not taking: Reported on 12/09/2017) 1 each 5  . meclizine (ANTIVERT) 25 MG tablet Take 1 tablet (25 mg total) by mouth 3 (three) times daily as needed for dizziness. May cause drowsiness 30 tablet 0  . metoprolol tartrate (LOPRESSOR) 50 MG tablet Take by mouth.     No current facility-administered medications for this visit.     Allergies  Allergen Reactions  . No  Known Allergies     Review of Systems  Constitutional: Negative for activity change, fever and unexpected weight change.  HENT: Negative for trouble swallowing and voice change.   Eyes: Negative for visual disturbance.  Respiratory: Positive for cough, shortness of breath and wheezing.   Cardiovascular: Negative for chest pain and leg swelling.  Genitourinary: Negative for difficulty urinating and dysuria.  Musculoskeletal: Positive for arthralgias and joint swelling.  Neurological: Negative for seizures, weakness and numbness.  Hematological: Negative for adenopathy. Does not bruise/bleed easily.  All other systems reviewed and are negative.   BP (!) 163/91   Pulse 68   Temp 97.7 F (36.5 C)   Resp 17   Wt 196 lb 3.2 oz (89 kg)   SpO2 96%   BMI 28.15 kg/m  Physical Exam Vitals signs reviewed.  Constitutional:      General: He is not in acute distress.    Appearance: Normal appearance.  HENT:     Head: Normocephalic and atraumatic.     Mouth/Throat:     Pharynx: Oropharynx is clear.  Eyes:     General: No scleral icterus.    Extraocular Movements: Extraocular movements intact.     Conjunctiva/sclera: Conjunctivae normal.     Pupils: Pupils are equal, round, and reactive to light.  Neck:     Musculoskeletal: Neck supple.  Cardiovascular:     Rate and Rhythm: Normal rate and regular rhythm.     Pulses: Normal pulses.     Heart sounds: Normal heart sounds. No murmur. No friction rub. No gallop.   Pulmonary:     Effort: Pulmonary effort is normal. No respiratory distress.     Breath sounds: Wheezing (faint at bases) present.  Abdominal:     General: There is no distension.     Palpations: Abdomen is soft.     Tenderness: There is no abdominal tenderness.  Musculoskeletal:        General: No swelling.  Lymphadenopathy:     Cervical: No cervical adenopathy.  Skin:    General: Skin is warm and dry.  Neurological:     General: No focal deficit present.     Mental  Status: He is alert and oriented to person, place, and time.     Motor: No weakness.    Diagnostic Tests: CT CHEST WITH CONTRAST  TECHNIQUE: Multidetector CT imaging of the chest was performed during intravenous contrast administration.  CONTRAST:  8mL OMNIPAQUE  IOHEXOL 300 MG/ML  SOLN  COMPARISON:  03/24/2018 chest CT.  FINDINGS: Cardiovascular: Normal heart size. No significant pericardial effusion/thickening. Atherosclerotic nonaneurysmal thoracic aorta. Normal caliber pulmonary arteries. No central pulmonary emboli.  Mediastinum/Nodes: No discrete thyroid nodules. Unremarkable esophagus. No pathologically enlarged axillary, mediastinal or hilar lymph nodes.  Lungs/Pleura: No pneumothorax. No pleural effusion. Status post left upper lobectomy. Moderate centrilobular and severe bullous paraseptal emphysema with mild diffuse bronchial wall thickening. No acute consolidative airspace disease or lung masses. Posterior right upper lobe solid 9 mm pulmonary nodule (series 5/image 65), increased from 7 mm on 03/24/2018 and 4 mm on 04/09/2017 chest CT studies. No additional significant pulmonary nodules.  Upper abdomen: Small hiatal hernia.  Musculoskeletal: No aggressive appearing focal osseous lesions. Mild thoracic spondylosis.  IMPRESSION: 1. Interval growth of solid posterior right upper lobe pulmonary nodule, now 9 mm, most suggestive of an enlarging malignant nodule, either a contralateral pulmonary metastasis or metachronous primary bronchogenic carcinoma. 2. No thoracic adenopathy or additional sites of metastatic disease in the chest.  Aortic Atherosclerosis (ICD10-I70.0) and Emphysema (ICD10-J43.9).   Electronically Signed   By: Ilona Sorrel M.D.   On: 09/25/2018 11:19 NUCLEAR MEDICINE PET SKULL BASE TO THIGH  TECHNIQUE: 11.0 mCi F-18 FDG was injected intravenously. Full-ring PET imaging was performed from the skull base to thigh after the  radiotracer. CT data was obtained and used for attenuation correction and anatomic localization.  Fasting blood glucose: 99 mg/dl  COMPARISON:  Multiple exams, including PET-CT from 06/19/2016 and chest CT from 03/24/2018  FINDINGS: Mediastinal blood pool activity: SUV max 2.5  NECK: Symmetric palatine tonsillar activity, likely physiologic. Activity in the glottis is slightly eccentric to the left but without appreciable asymmetry on the CT data, likely physiologic as well.  Incidental CT findings: Left common carotid atherosclerotic calcification.  CHEST: A 0.8 by 0.5 cm right upper lobe pulmonary nodule on image 75/4 has a maximum SUV of 1.9.  Scarring medially in the right middle lobe with only very faint associated activity, not appreciably changed from 06/19/2016.  Prior left upper lobectomy.  Incidental CT findings: Severe emphysema. Atherosclerotic calcification of the aortic arch and the circumflex coronary artery.  ABDOMEN/PELVIS: No significant abnormal hypermetabolic activity in this region.  Incidental CT findings: Aortoiliac atherosclerotic vascular disease. Infrarenal abdominal aortic aneurysm 3.6 cm transverse, previously 3.4 cm on 06/19/2016.  SKELETON: No significant abnormal hypermetabolic activity in this region.  Incidental CT findings: none  IMPRESSION: 1. The 8 by 5 mm right upper lobe pulmonary nodule is focally hypermetabolic. Although the SUV is only 1.9, this nodule is at the lower size limits for sensitive assessment by PET-CT, and accordingly even at this lower SUV concern is raised for the possibility of malignancy. The lesion has also increased in size compared to 04/09/2017, suspicious for low-grade malignancy. I am skeptical that percutaneous biopsy would be feasible, and wedge resection likewise is potentially on feasible given the obviously low respiratory reserve based on the degree of emphysema and prior left  upper lobectomy. Possibilities might include further surveillance for presumptive treatment with radiation therapy. This case might benefit from discussion at the multidisciplinary thoracic oncology conference. 2. Infrarenal abdominal aortic aneurysm, 3.6 cm in diameter. Recommend followup by [place] in 2 years. This recommendation follows ACR consensus guidelines: White Paper of the ACR Incidental Findings Committee II on Vascular Findings. J Am Coll Radiol 2013; 10:789-794. 3. Other imaging findings of potential clinical significance: Aortic Atherosclerosis (ICD10-I70.0) and Emphysema (ICD10-J43.9). Coronary atherosclerosis. Left upper lobectomy.  Electronically Signed   By: Van Clines M.D.   On: 10/15/2018 14:05 I personally reviewed the CT and PET CT images and they were discussed as a group in our [facility] conference  Impression: Randall Sullivan is a 68 yo man with a history of tobacco abuse, COPD, lung cancer, paroxysmal atrial fib, hypertension and arthritis. I did a left upper lobectomy in 2017 for a stage IA non-small cell carcinoma. He now has a new 8-9 mm nodule in the posterior aspect of his right upper lobe. Highly suspicious for a new primary bronchogenic carcinoma. Recurrence or non malignant etiologies (granuloma) are also in the differential diagnosis.  This nodule is near the fissure and in an area with significant emphysematous blebs. It is not favorable for bronchoscopic or CT guided biopsy.  I discussed several options with Mr. Jacuinde and his family. One would radiographic observation. Neither I nor they are in favor of that approach. We discussed surgical resection (wedge) v stereotactic radiation. They understand the relative advantages and disadvantages of each.  We did discuss the potential surgery- Right VATS with wedge resection. They are familiar with the approach since he had a left upper lobectomy previously. I informed them of the indications, risks, benefits and  alternatives. I have discussed with the patient the general nature of the procedure, need for general anesthesia,and incisions to be used. They understand the risks include, but are not limited to death, stroke, MI, DVT/PE, bleeding, possible need for transfusion, infections, prolonged air leaks, cardiac arrhythmias, as well as other organ system dysfunction including respiratory, renal, or GI complications.   He wishes to think about it and let us know how he would like to proceed.  I did offer to arrange a consultation with Radiation Oncology to discuss SBRT if he would like.  Plan: Right VATS, wedge resection. Patient will call to schedule if he wishes to proceed  [name], MD [facility] [phone]

## 2018-11-16 ENCOUNTER — Other Ambulatory Visit: Payer: Self-pay | Admitting: *Deleted

## 2018-11-16 ENCOUNTER — Telehealth: Payer: Self-pay | Admitting: *Deleted

## 2018-11-16 ENCOUNTER — Encounter: Payer: Self-pay | Admitting: *Deleted

## 2018-11-16 DIAGNOSIS — R911 Solitary pulmonary nodule: Secondary | ICD-10-CM

## 2018-11-16 NOTE — Telephone Encounter (Signed)
Oncology Nurse Navigator Documentation  Oncology Nurse Navigator Flowsheets 11/16/2018  Navigator Location [facility]  Navigator Encounter Type Telephone/I called to follow up with Mr. Curran regarding his thoughts on surgery.  He would like to go ahead with surgery.  I will update Dr. Leonarda Salon office and patient is aware.   Telephone Outgoing Call  Treatment Phase Abnormal Scans  Barriers/Navigation Needs Education;Coordination of Care  Education Other  Interventions Coordination of Care;Education  Coordination of Care Other  Education Method Verbal  Acuity Level 2  Time Spent with Patient 30

## 2018-12-07 ENCOUNTER — Ambulatory Visit (HOSPITAL_COMMUNITY)
Admission: RE | Admit: 2018-12-07 | Discharge: 2018-12-07 | Disposition: A | Discharge status: Home / Self Care | Payer: Medicare Other | Source: Ambulatory Visit | Attending: Thoracic Surgery (Cardiothoracic Vascular Surgery) | Admitting: Thoracic Surgery (Cardiothoracic Vascular Surgery)

## 2018-12-07 ENCOUNTER — Encounter (HOSPITAL_COMMUNITY)
Admission: RE | Admit: 2018-12-07 | Discharge: 2018-12-07 | Disposition: A | Discharge status: Home / Self Care | Payer: Medicare Other | Source: Ambulatory Visit | Attending: Thoracic Surgery (Cardiothoracic Vascular Surgery) | Admitting: Thoracic Surgery (Cardiothoracic Vascular Surgery)

## 2018-12-07 ENCOUNTER — Other Ambulatory Visit: Payer: Self-pay

## 2018-12-07 ENCOUNTER — Encounter (HOSPITAL_COMMUNITY): Payer: Self-pay

## 2018-12-07 DIAGNOSIS — J984 Other disorders of lung: Secondary | ICD-10-CM | POA: Diagnosis not present

## 2018-12-07 DIAGNOSIS — R911 Solitary pulmonary nodule: Secondary | ICD-10-CM | POA: Insufficient documentation

## 2018-12-07 DIAGNOSIS — J95812 Postprocedural air leak: Secondary | ICD-10-CM | POA: Diagnosis not present

## 2018-12-07 DIAGNOSIS — J1108 Influenza due to unidentified influenza virus with specified pneumonia: Secondary | ICD-10-CM | POA: Diagnosis not present

## 2018-12-07 DIAGNOSIS — J441 Chronic obstructive pulmonary disease with (acute) exacerbation: Secondary | ICD-10-CM | POA: Diagnosis not present

## 2018-12-07 DIAGNOSIS — T797XXA Traumatic subcutaneous emphysema, initial encounter: Secondary | ICD-10-CM | POA: Diagnosis not present

## 2018-12-07 DIAGNOSIS — M47814 Spondylosis without myelopathy or radiculopathy, thoracic region: Secondary | ICD-10-CM | POA: Diagnosis not present

## 2018-12-07 DIAGNOSIS — J440 Chronic obstructive pulmonary disease with acute lower respiratory infection: Secondary | ICD-10-CM | POA: Diagnosis not present

## 2018-12-07 DIAGNOSIS — K449 Diaphragmatic hernia without obstruction or gangrene: Secondary | ICD-10-CM | POA: Diagnosis not present

## 2018-12-07 DIAGNOSIS — J129 Viral pneumonia, unspecified: Secondary | ICD-10-CM | POA: Diagnosis not present

## 2018-12-07 DIAGNOSIS — C3411 Malignant neoplasm of upper lobe, right bronchus or lung: Secondary | ICD-10-CM | POA: Diagnosis not present

## 2018-12-07 DIAGNOSIS — J9621 Acute and chronic respiratory failure with hypoxia: Secondary | ICD-10-CM | POA: Diagnosis not present

## 2018-12-07 DIAGNOSIS — I700 Atherosclerosis of aorta: Secondary | ICD-10-CM | POA: Diagnosis not present

## 2018-12-07 DIAGNOSIS — Z01811 Encounter for preprocedural respiratory examination: Secondary | ICD-10-CM | POA: Diagnosis not present

## 2018-12-07 DIAGNOSIS — I10 Essential (primary) hypertension: Secondary | ICD-10-CM | POA: Diagnosis not present

## 2018-12-07 HISTORY — DX: Unspecified atrial fibrillation: I48.91

## 2018-12-07 LAB — PULMONARY FUNCTION TEST
DL/VA % pred: 63 %
DL/VA: 2.59 ml/min/mmHg/L
DLCO unc % pred: 62 %
DLCO unc: 16.39 ml/min/mmHg
FEF 25-75 POST: 0.44 L/s
FEF 25-75 Pre: 0.37 L/sec
FEF2575-%Change-Post: 17 %
FEF2575-%PRED-POST: 17 %
FEF2575-%Pred-Pre: 14 %
FEV1-%CHANGE-POST: 12 %
FEV1-%Pred-Post: 34 %
FEV1-%Pred-Pre: 30 %
FEV1-Post: 1.14 L
FEV1-Pre: 1.01 L
FEV1FVC-%Change-Post: 2 %
FEV1FVC-%Pred-Pre: 39 %
FEV6-%Change-Post: 6 %
FEV6-%PRED-POST: 68 %
FEV6-%Pred-Pre: 64 %
FEV6-Post: 2.92 L
FEV6-Pre: 2.73 L
FEV6FVC-%Change-Post: -2 %
FEV6FVC-%Pred-Post: 80 %
FEV6FVC-%Pred-Pre: 82 %
FVC-%Change-Post: 10 %
FVC-%Pred-Post: 85 %
FVC-%Pred-Pre: 77 %
FVC-Post: 3.85 L
FVC-Pre: 3.49 L
PRE FEV6/FVC RATIO: 78 %
Post FEV1/FVC ratio: 30 %
Post FEV6/FVC ratio: 76 %
Pre FEV1/FVC ratio: 29 %
RV % PRED: 253 %
RV: 6.09 L
TLC % pred: 143 %
TLC: 10.08 L

## 2018-12-07 LAB — URINALYSIS, ROUTINE W REFLEX MICROSCOPIC
Bilirubin Urine: NEGATIVE
Glucose, UA: NEGATIVE mg/dL
Hgb urine dipstick: NEGATIVE
Ketones, ur: NEGATIVE mg/dL
Leukocytes,Ua: NEGATIVE
Nitrite: NEGATIVE
Protein, ur: NEGATIVE mg/dL
Specific Gravity, Urine: 1.023 (ref 1.005–1.030)
pH: 6 (ref 5.0–8.0)

## 2018-12-07 LAB — CBC
HCT: 46.3 % (ref 39.0–52.0)
Hemoglobin: 14.4 g/dL (ref 13.0–17.0)
MCH: 26.5 pg (ref 26.0–34.0)
MCHC: 31.1 g/dL (ref 30.0–36.0)
MCV: 85.3 fL (ref 80.0–100.0)
Platelets: 336 10*3/uL (ref 150–400)
RBC: 5.43 MIL/uL (ref 4.22–5.81)
RDW: 14.1 % (ref 11.5–15.5)
WBC: 10.4 10*3/uL (ref 4.0–10.5)
nRBC: 0 % (ref 0.0–0.2)

## 2018-12-07 LAB — COMPREHENSIVE METABOLIC PANEL
ALT: 22 U/L (ref 0–44)
ANION GAP: 9 (ref 5–15)
AST: 27 U/L (ref 15–41)
Albumin: 3.6 g/dL (ref 3.5–5.0)
Alkaline Phosphatase: 54 U/L (ref 38–126)
BILIRUBIN TOTAL: 0.8 mg/dL (ref 0.3–1.2)
BUN: 14 mg/dL (ref 8–23)
CO2: 26 mmol/L (ref 22–32)
Calcium: 8.7 mg/dL — ABNORMAL LOW (ref 8.9–10.3)
Chloride: 102 mmol/L (ref 98–111)
Creatinine, Ser: 0.98 mg/dL (ref 0.61–1.24)
GFR calc Af Amer: >60 mL/min (ref >60)
GFR calc non Af Amer: >60 mL/min (ref >60)
Glucose, Bld: 85 mg/dL (ref 70–99)
Potassium: 4 mmol/L (ref 3.5–5.1)
Sodium: 137 mmol/L (ref 135–145)
TOTAL PROTEIN: 6.9 g/dL (ref 6.5–8.1)

## 2018-12-07 LAB — SURGICAL PCR SCREEN
MRSA, PCR: NEGATIVE
Staphylococcus aureus: NEGATIVE

## 2018-12-07 LAB — TYPE AND SCREEN
ABO/RH(D): A POS
Antibody Screen: NEGATIVE

## 2018-12-07 LAB — APTT: aPTT: 28 seconds (ref 24–36)

## 2018-12-07 LAB — PROTIME-INR
INR: 1 (ref 0.8–1.2)
Prothrombin Time: 13.3 seconds (ref 11.4–15.2)

## 2018-12-07 MED ORDER — ALBUTEROL SULFATE (2.5 MG/3ML) 0.083% IN NEBU
2.5000 mg | INHALATION_SOLUTION | Freq: Once | RESPIRATORY_TRACT | Status: AC
  Administered 2018-12-07: 2.5 mg via RESPIRATORY_TRACT

## 2018-12-07 NOTE — Anesthesia Preprocedure Evaluation (Addendum)
Anesthesia Evaluation  Patient identified by MRN, date of birth, ID band Patient awake    Reviewed: Allergy & Precautions, NPO status , Patient's Chart, lab work & pertinent test results  History of Anesthesia Complications (+) history of anesthetic complications  Airway Mallampati: II  TM Distance: >3 FB Neck ROM: Full    Dental  (+) Dental Advisory Given   Pulmonary COPD, former smoker,    Pulmonary exam normal breath sounds clear to auscultation       Cardiovascular hypertension, Pt. on home beta blockers and Pt. on medications Normal cardiovascular exam Rhythm:Regular Rate:Normal     Neuro/Psych negative neurological ROS     GI/Hepatic negative GI ROS, Neg liver ROS,   Endo/Other  negative endocrine ROS  Renal/GU negative Renal ROS     Musculoskeletal  (+) Arthritis ,   Abdominal   Peds  Hematology negative hematology ROS (+)   Anesthesia Other Findings Day of surgery medications reviewed with the patient.  Reproductive/Obstetrics                                                            Anesthesia Evaluation  Patient identified by MRN, date of birth, ID band Patient awake    Reviewed: Allergy & Precautions, NPO status , Patient's Chart, lab work & pertinent test results  Airway Mallampati: I  TM Distance: >3 FB Neck ROM: Full    Dental   Pulmonary COPD, former smoker,    Pulmonary exam normal        Cardiovascular hypertension, Pt. on medications Normal cardiovascular exam     Neuro/Psych    GI/Hepatic   Endo/Other    Renal/GU      Musculoskeletal   Abdominal   Peds  Hematology   Anesthesia Other Findings   Reproductive/Obstetrics                             Anesthesia Physical Anesthesia Plan  ASA: III  Anesthesia Plan: General   Post-op Pain Management:    Induction: Intravenous  Airway Management  Planned: Double Lumen EBT  Additional Equipment: Arterial line, CVP and Ultrasound Guidance Line Placement  Intra-op Plan:   Post-operative Plan: Extubation in OR  Informed Consent: I have reviewed the patients History and Physical, chart, labs and discussed the procedure including the risks, benefits and alternatives for the proposed anesthesia with the patient or authorized representative who has indicated his/her understanding and acceptance.     Plan Discussed with: CRNA and Surgeon  Anesthesia Plan Comments:         Anesthesia Quick Evaluation                                   Anesthesia Evaluation  Patient identified by MRN, date of birth, ID band Patient awake    Reviewed: Allergy & Precautions, H&P , NPO status , Patient's Chart, lab work & pertinent test results  Airway Mallampati: II   Neck ROM: full    Dental   Pulmonary COPD, former smoker,  LUL nodule   breath sounds clear to auscultation       Cardiovascular hypertension,  Rhythm:regular Rate:Normal     Neuro/Psych  GI/Hepatic   Endo/Other    Renal/GU      Musculoskeletal  (+) Arthritis ,   Abdominal   Peds  Hematology   Anesthesia Other Findings   Reproductive/Obstetrics                             Anesthesia Physical Anesthesia Plan  ASA: III  Anesthesia Plan: General   Post-op Pain Management:    Induction: Intravenous  Airway Management Planned: Oral ETT  Additional Equipment:   Intra-op Plan:   Post-operative Plan: Extubation in OR  Informed Consent: I have reviewed the patients History and Physical, chart, labs and discussed the procedure including the risks, benefits and alternatives for the proposed anesthesia with the patient or authorized representative who has indicated his/her understanding and acceptance.     Plan Discussed with: CRNA, Anesthesiologist and Surgeon  Anesthesia Plan Comments:          Anesthesia Quick Evaluation  Anesthesia Physical Anesthesia Plan  ASA: III  Anesthesia Plan: General   Post-op Pain Management:    Induction: Intravenous  PONV Risk Score and Plan: 3 and Ondansetron, Dexamethasone and Treatment may vary due to age or medical condition  Airway Management Planned: Double Lumen EBT  Additional Equipment: Arterial line, CVP and Ultrasound Guidance Line Placement  Intra-op Plan:   Post-operative Plan: Extubation in OR  Informed Consent: I have reviewed the patients History and Physical, chart, labs and discussed the procedure including the risks, benefits and alternatives for the proposed anesthesia with the patient or authorized representative who has indicated his/her understanding and acceptance.     Dental advisory given  Plan Discussed with: CRNA  Anesthesia Plan Comments: (Hx of postop afib after similar procedure in 2017. Converted with medical therapy and has maintained sinus rhythm. In 2018 Dr. Percival Spanish stopped his warfarin and advised him to f/u PRN. Pt reports he has remained asymptomatic. )       Anesthesia Quick Evaluation

## 2018-12-07 NOTE — Pre-Procedure Instructions (Signed)
Randall Sullivan  12/07/2018      [facility] [address] [place] [address] Phone: [phone] Fax: [phone]    Your procedure is scheduled on March 11  Report to [facility] at [facility].  Call this number if you have problems the morning of surgery:  [phone]   Remember:  Do not eat or drink after midnight.    Take these medicines the morning of surgery with A SIP OF WATER   acetaminophen (TYLENOL) if needed albuterol (PROVENTIL) (2.5 MG/3ML)  fexofenadine (ALLEGRA) metoprolol succinate (TOPROL-XL)   7 days prior to surgery STOP taking any Aspirin (unless otherwise instructed by your surgeon), Aleve, Naproxen, Ibuprofen, Motrin, Advil, Goody's, BC's, all herbal medications, fish oil, and all vitamins.  Follow your surgeon's instructions on when to stop Asprin.  If no instructions were given by your surgeon then you will need to call the office to get those instructions.       Do not wear jewelry  Do not wear lotions, powders, or cologne, or deodorant.  Men may shave face and neck.  Do not bring valuables to the hospital.  [facility] is not responsible for any belongings or valuables.  Contacts, dentures or bridgework may not be worn into surgery.  Leave your suitcase in the car.  After surgery it may be brought to your room.  For patients admitted to the hospital, discharge time will be determined by your treatment team.  Patients discharged the day of surgery will not be allowed to drive home.    Special instructions:   [facility]- Preparing For Surgery  Before surgery, you can play an important role. Because skin is not sterile, your skin needs to be as free of germs as possible. You can reduce the number of germs on your skin by washing with CHG (chlorahexidine gluconate) Soap before surgery.  CHG is an antiseptic cleaner which kills germs and bonds with the skin to continue killing  germs even after washing.    Oral Hygiene is also important to reduce your risk of infection.  Remember - BRUSH YOUR TEETH THE MORNING OF SURGERY WITH YOUR REGULAR TOOTHPASTE  Please do not use if you have an allergy to CHG or antibacterial soaps. If your skin becomes reddened/irritated stop using the CHG.  Do not shave (including legs and underarms) for at least 48 hours prior to first CHG shower. It is OK to shave your face.  Please follow these instructions carefully.   1. Shower the NIGHT BEFORE SURGERY and the MORNING OF SURGERY with CHG.   2. If you chose to wash your hair, wash your hair first as usual with your normal shampoo.  3. After you shampoo, rinse your hair and body thoroughly to remove the shampoo.  4. Use CHG as you would any other liquid soap. You can apply CHG directly to the skin and wash gently with a scrungie or a clean washcloth.   5. Apply the CHG Soap to your body ONLY FROM THE NECK DOWN.  Do not use on open wounds or open sores. Avoid contact with your eyes, ears, mouth and genitals (private parts). Wash Face and genitals (private parts)  with your normal soap.  6. Wash thoroughly, paying special attention to the area where your surgery will be performed.  7. Thoroughly rinse your body with warm water from the neck down.  8. DO NOT shower/wash with your  normal soap after using and rinsing off the CHG Soap.  9. Pat yourself dry with a CLEAN TOWEL.  10. Wear CLEAN PAJAMAS to bed the night before surgery, wear comfortable clothes the morning of surgery  11. Place CLEAN SHEETS on your bed the night of your first shower and DO NOT SLEEP WITH PETS.    Day of Surgery:  Do not apply any deodorants/lotions.  Please wear clean clothes to the hospital/surgery center.   Remember to brush your teeth WITH YOUR REGULAR TOOTHPASTE.    Please read over the following fact sheets that you were given.

## 2018-12-07 NOTE — Progress Notes (Signed)
PCP - [facility] Cardiologist - [name]  Chest x-ray - 12/07/18 EKG - 12/07/18 Stress Test - denies ECHO - denies Cardiac Cath - denies   Aspirin Instructions: continue aspirin but do not take the morning of surgery  Anesthesia review: yes, post op a-fib 2017  Patient denies shortness of breath, fever, cough and chest pain at PAT appointment   Patient verbalized understanding of instructions that were given to them at the PAT appointment. Patient was also instructed that they will need to review over the PAT instructions again at home before surgery.

## 2018-12-09 ENCOUNTER — Inpatient Hospital Stay (HOSPITAL_COMMUNITY): Payer: Medicare Other | Admitting: Physician Assistant

## 2018-12-09 ENCOUNTER — Inpatient Hospital Stay (HOSPITAL_COMMUNITY): Payer: Medicare Other

## 2018-12-09 ENCOUNTER — Encounter (HOSPITAL_COMMUNITY): Payer: Self-pay | Admitting: *Deleted

## 2018-12-09 ENCOUNTER — Encounter (HOSPITAL_COMMUNITY)
Admission: RE | Disposition: A | Discharge status: Home Health | Payer: Self-pay | Source: Home / Self Care | Attending: Thoracic Surgery (Cardiothoracic Vascular Surgery)

## 2018-12-09 ENCOUNTER — Other Ambulatory Visit: Payer: Self-pay

## 2018-12-09 ENCOUNTER — Inpatient Hospital Stay (HOSPITAL_COMMUNITY)
Admission: RE | Admit: 2018-12-09 | Discharge: 2019-01-06 | DRG: 163 | Disposition: A | Discharge status: Home Health | Payer: Medicare Other | Attending: Thoracic Surgery (Cardiothoracic Vascular Surgery) | Admitting: Thoracic Surgery (Cardiothoracic Vascular Surgery)

## 2018-12-09 ENCOUNTER — Inpatient Hospital Stay (HOSPITAL_COMMUNITY): Payer: Medicare Other | Admitting: Certified Registered"

## 2018-12-09 DIAGNOSIS — Z902 Acquired absence of lung [part of]: Secondary | ICD-10-CM

## 2018-12-09 DIAGNOSIS — J95812 Postprocedural air leak: Secondary | ICD-10-CM | POA: Diagnosis not present

## 2018-12-09 DIAGNOSIS — R918 Other nonspecific abnormal finding of lung field: Secondary | ICD-10-CM | POA: Diagnosis not present

## 2018-12-09 DIAGNOSIS — Z809 Family history of malignant neoplasm, unspecified: Secondary | ICD-10-CM

## 2018-12-09 DIAGNOSIS — C3411 Malignant neoplasm of upper lobe, right bronchus or lung: Principal | ICD-10-CM | POA: Diagnosis present

## 2018-12-09 DIAGNOSIS — I714 Abdominal aortic aneurysm, without rupture: Secondary | ICD-10-CM | POA: Diagnosis present

## 2018-12-09 DIAGNOSIS — I10 Essential (primary) hypertension: Secondary | ICD-10-CM | POA: Diagnosis present

## 2018-12-09 DIAGNOSIS — I4891 Unspecified atrial fibrillation: Secondary | ICD-10-CM | POA: Diagnosis not present

## 2018-12-09 DIAGNOSIS — M47814 Spondylosis without myelopathy or radiculopathy, thoracic region: Secondary | ICD-10-CM | POA: Diagnosis present

## 2018-12-09 DIAGNOSIS — J129 Viral pneumonia, unspecified: Secondary | ICD-10-CM | POA: Diagnosis not present

## 2018-12-09 DIAGNOSIS — J1100 Influenza due to unidentified influenza virus with unspecified type of pneumonia: Secondary | ICD-10-CM | POA: Diagnosis not present

## 2018-12-09 DIAGNOSIS — K5900 Constipation, unspecified: Secondary | ICD-10-CM | POA: Diagnosis not present

## 2018-12-09 DIAGNOSIS — J441 Chronic obstructive pulmonary disease with (acute) exacerbation: Secondary | ICD-10-CM | POA: Diagnosis not present

## 2018-12-09 DIAGNOSIS — Z7951 Long term (current) use of inhaled steroids: Secondary | ICD-10-CM

## 2018-12-09 DIAGNOSIS — K449 Diaphragmatic hernia without obstruction or gangrene: Secondary | ICD-10-CM | POA: Diagnosis present

## 2018-12-09 DIAGNOSIS — I2510 Atherosclerotic heart disease of native coronary artery without angina pectoris: Secondary | ICD-10-CM | POA: Diagnosis present

## 2018-12-09 DIAGNOSIS — Y95 Nosocomial condition: Secondary | ICD-10-CM | POA: Diagnosis not present

## 2018-12-09 DIAGNOSIS — Z85118 Personal history of other malignant neoplasm of bronchus and lung: Secondary | ICD-10-CM

## 2018-12-09 DIAGNOSIS — T797XXA Traumatic subcutaneous emphysema, initial encounter: Secondary | ICD-10-CM | POA: Diagnosis not present

## 2018-12-09 DIAGNOSIS — Z79899 Other long term (current) drug therapy: Secondary | ICD-10-CM

## 2018-12-09 DIAGNOSIS — I700 Atherosclerosis of aorta: Secondary | ICD-10-CM | POA: Diagnosis present

## 2018-12-09 DIAGNOSIS — Z87891 Personal history of nicotine dependence: Secondary | ICD-10-CM

## 2018-12-09 DIAGNOSIS — J1108 Influenza due to unidentified influenza virus with specified pneumonia: Secondary | ICD-10-CM | POA: Diagnosis not present

## 2018-12-09 DIAGNOSIS — E669 Obesity, unspecified: Secondary | ICD-10-CM | POA: Diagnosis present

## 2018-12-09 DIAGNOSIS — Z7982 Long term (current) use of aspirin: Secondary | ICD-10-CM | POA: Diagnosis not present

## 2018-12-09 DIAGNOSIS — Z6829 Body mass index (BMI) 29.0-29.9, adult: Secondary | ICD-10-CM | POA: Diagnosis not present

## 2018-12-09 DIAGNOSIS — J9621 Acute and chronic respiratory failure with hypoxia: Secondary | ICD-10-CM | POA: Diagnosis not present

## 2018-12-09 DIAGNOSIS — Y838 Other surgical procedures as the cause of abnormal reaction of the patient, or of later complication, without mention of misadventure at the time of the procedure: Secondary | ICD-10-CM | POA: Diagnosis not present

## 2018-12-09 DIAGNOSIS — J9601 Acute respiratory failure with hypoxia: Secondary | ICD-10-CM | POA: Diagnosis not present

## 2018-12-09 DIAGNOSIS — J440 Chronic obstructive pulmonary disease with acute lower respiratory infection: Secondary | ICD-10-CM | POA: Diagnosis not present

## 2018-12-09 DIAGNOSIS — J101 Influenza due to other identified influenza virus with other respiratory manifestations: Secondary | ICD-10-CM | POA: Diagnosis not present

## 2018-12-09 DIAGNOSIS — K219 Gastro-esophageal reflux disease without esophagitis: Secondary | ICD-10-CM | POA: Diagnosis not present

## 2018-12-09 DIAGNOSIS — D72829 Elevated white blood cell count, unspecified: Secondary | ICD-10-CM | POA: Diagnosis not present

## 2018-12-09 DIAGNOSIS — J9690 Respiratory failure, unspecified, unspecified whether with hypoxia or hypercapnia: Secondary | ICD-10-CM

## 2018-12-09 DIAGNOSIS — J948 Other specified pleural conditions: Secondary | ICD-10-CM | POA: Diagnosis not present

## 2018-12-09 DIAGNOSIS — I480 Paroxysmal atrial fibrillation: Secondary | ICD-10-CM | POA: Diagnosis not present

## 2018-12-09 DIAGNOSIS — R001 Bradycardia, unspecified: Secondary | ICD-10-CM | POA: Diagnosis not present

## 2018-12-09 DIAGNOSIS — Z09 Encounter for follow-up examination after completed treatment for conditions other than malignant neoplasm: Secondary | ICD-10-CM

## 2018-12-09 DIAGNOSIS — R911 Solitary pulmonary nodule: Secondary | ICD-10-CM

## 2018-12-09 DIAGNOSIS — Z9981 Dependence on supplemental oxygen: Secondary | ICD-10-CM

## 2018-12-09 DIAGNOSIS — J95811 Postprocedural pneumothorax: Secondary | ICD-10-CM | POA: Diagnosis not present

## 2018-12-09 DIAGNOSIS — J9382 Other air leak: Secondary | ICD-10-CM | POA: Diagnosis not present

## 2018-12-09 DIAGNOSIS — J939 Pneumothorax, unspecified: Secondary | ICD-10-CM | POA: Diagnosis not present

## 2018-12-09 DIAGNOSIS — J9383 Other pneumothorax: Secondary | ICD-10-CM | POA: Diagnosis not present

## 2018-12-09 DIAGNOSIS — J181 Lobar pneumonia, unspecified organism: Secondary | ICD-10-CM | POA: Diagnosis not present

## 2018-12-09 DIAGNOSIS — Z4682 Encounter for fitting and adjustment of non-vascular catheter: Secondary | ICD-10-CM | POA: Diagnosis not present

## 2018-12-09 DIAGNOSIS — Z9689 Presence of other specified functional implants: Secondary | ICD-10-CM

## 2018-12-09 DIAGNOSIS — J80 Acute respiratory distress syndrome: Secondary | ICD-10-CM | POA: Diagnosis not present

## 2018-12-09 DIAGNOSIS — J90 Pleural effusion, not elsewhere classified: Secondary | ICD-10-CM | POA: Diagnosis not present

## 2018-12-09 DIAGNOSIS — R0602 Shortness of breath: Secondary | ICD-10-CM | POA: Diagnosis not present

## 2018-12-09 DIAGNOSIS — Z452 Encounter for adjustment and management of vascular access device: Secondary | ICD-10-CM | POA: Diagnosis not present

## 2018-12-09 DIAGNOSIS — J439 Emphysema, unspecified: Secondary | ICD-10-CM | POA: Diagnosis not present

## 2018-12-09 DIAGNOSIS — J449 Chronic obstructive pulmonary disease, unspecified: Secondary | ICD-10-CM | POA: Diagnosis not present

## 2018-12-09 DIAGNOSIS — J984 Other disorders of lung: Secondary | ICD-10-CM | POA: Diagnosis not present

## 2018-12-09 HISTORY — PX: VIDEO ASSISTED THORACOSCOPY (VATS)/WEDGE RESECTION: SHX6174

## 2018-12-09 LAB — BLOOD GAS, ARTERIAL
Acid-Base Excess: 2.3 mmol/L — ABNORMAL HIGH (ref 0.0–2.0)
Bicarbonate: 26.8 mmol/L (ref 20.0–28.0)
DRAWN BY: 42180
FIO2: 21
O2 SAT: 98.5 %
Patient temperature: 98.6
pCO2 arterial: 44.6 mmHg (ref 32.0–48.0)
pH, Arterial: 7.396 (ref 7.350–7.450)
pO2, Arterial: 130 mmHg — ABNORMAL HIGH (ref 83.0–108.0)

## 2018-12-09 SURGERY — VIDEO ASSISTED THORACOSCOPY (VATS)/WEDGE RESECTION
Anesthesia: General | Site: Chest | Laterality: Right

## 2018-12-09 MED ORDER — PHENYLEPHRINE 40 MCG/ML (10ML) SYRINGE FOR IV PUSH (FOR BLOOD PRESSURE SUPPORT)
PREFILLED_SYRINGE | INTRAVENOUS | Status: AC
  Filled 2018-12-09: qty 10

## 2018-12-09 MED ORDER — SENNOSIDES-DOCUSATE SODIUM 8.6-50 MG PO TABS
1.0000 | ORAL_TABLET | Freq: Every day | ORAL | Status: DC
  Administered 2018-12-09 – 2018-12-23 (×12): 1 via ORAL
  Filled 2018-12-09 (×13): qty 1

## 2018-12-09 MED ORDER — MIDAZOLAM HCL 2 MG/2ML IJ SOLN
INTRAMUSCULAR | Status: AC
  Filled 2018-12-09: qty 2

## 2018-12-09 MED ORDER — ALBUTEROL SULFATE (2.5 MG/3ML) 0.083% IN NEBU: 2.5000 mg | INHALATION_SOLUTION | Freq: Four times a day (QID) | RESPIRATORY_TRACT | Status: DC

## 2018-12-09 MED ORDER — LUNG SURGERY BOOK
Freq: Once | Status: AC
  Administered 2018-12-09: 1

## 2018-12-09 MED ORDER — HYDROMORPHONE HCL 1 MG/ML IJ SOLN
0.2500 mg | INTRAMUSCULAR | Status: DC | PRN
  Administered 2018-12-09 (×2): 0.5 mg via INTRAVENOUS

## 2018-12-09 MED ORDER — MORPHINE SULFATE 2 MG/ML IV SOLN
INTRAVENOUS | Status: DC
  Administered 2018-12-09: 10 mg via INTRAVENOUS
  Administered 2018-12-09: 2 mg via INTRAVENOUS
  Administered 2018-12-09: 12:00:00 via INTRAVENOUS
  Administered 2018-12-10: 4 mg via INTRAVENOUS
  Administered 2018-12-10 (×2): 1 mg via INTRAVENOUS
  Administered 2018-12-10: 3 mg via INTRAVENOUS
  Administered 2018-12-10: 4 mg via INTRAVENOUS
  Administered 2018-12-10: 2 mg via INTRAVENOUS
  Administered 2018-12-10: 5 mg via INTRAVENOUS
  Administered 2018-12-11: 23:00:00 via INTRAVENOUS
  Administered 2018-12-11 (×2): 2 mg via INTRAVENOUS
  Administered 2018-12-12: 4 mg via INTRAVENOUS
  Administered 2018-12-12: 2 mg via INTRAVENOUS
  Administered 2018-12-12: 7 mg via INTRAVENOUS
  Administered 2018-12-13: 5 mg via INTRAVENOUS
  Administered 2018-12-14: 1 mg via INTRAVENOUS
  Administered 2018-12-14: 0 mg via INTRAVENOUS
  Administered 2018-12-14: 2 mg via INTRAVENOUS
  Administered 2018-12-14: 1 mg via INTRAVENOUS
  Filled 2018-12-09 (×2): qty 50

## 2018-12-09 MED ORDER — LORATADINE 10 MG PO TABS
10.0000 mg | ORAL_TABLET | Freq: Every day | ORAL | Status: DC
  Administered 2018-12-10 – 2019-01-06 (×27): 10 mg via ORAL
  Filled 2018-12-09 (×27): qty 1

## 2018-12-09 MED ORDER — METOCLOPRAMIDE HCL 5 MG/ML IJ SOLN
10.0000 mg | Freq: Four times a day (QID) | INTRAMUSCULAR | Status: AC
  Administered 2018-12-09 – 2018-12-10 (×3): 10 mg via INTRAVENOUS
  Filled 2018-12-09 (×3): qty 2

## 2018-12-09 MED ORDER — HEMOSTATIC AGENTS (NO CHARGE) OPTIME
TOPICAL | Status: DC | PRN
  Administered 2018-12-09: 1 via TOPICAL

## 2018-12-09 MED ORDER — TRAMADOL HCL 50 MG PO TABS
50.0000 mg | ORAL_TABLET | Freq: Four times a day (QID) | ORAL | Status: DC | PRN
  Administered 2018-12-10 – 2018-12-15 (×2): 100 mg via ORAL
  Administered 2018-12-17 – 2018-12-18 (×2): 50 mg via ORAL
  Administered 2018-12-20: 100 mg via ORAL
  Administered 2018-12-22 (×2): 50 mg via ORAL
  Administered 2018-12-23 (×2): 100 mg via ORAL
  Administered 2018-12-26: 50 mg via ORAL
  Administered 2018-12-28 – 2019-01-06 (×17): 100 mg via ORAL
  Filled 2018-12-09 (×3): qty 2
  Filled 2018-12-09: qty 1
  Filled 2018-12-09 (×3): qty 2
  Filled 2018-12-09: qty 1
  Filled 2018-12-09: qty 2
  Filled 2018-12-09: qty 1
  Filled 2018-12-09 (×11): qty 2
  Filled 2018-12-09: qty 1
  Filled 2018-12-09 (×5): qty 2

## 2018-12-09 MED ORDER — BUPIVACAINE HCL (PF) 0.5 % IJ SOLN
INTRAMUSCULAR | Status: AC
  Filled 2018-12-09: qty 30

## 2018-12-09 MED ORDER — CEFAZOLIN SODIUM-DEXTROSE 2-4 GM/100ML-% IV SOLN
2.0000 g | INTRAVENOUS | Status: AC
  Administered 2018-12-09: 2 g via INTRAVENOUS
  Filled 2018-12-09: qty 100

## 2018-12-09 MED ORDER — PROPOFOL 10 MG/ML IV BOLUS
INTRAVENOUS | Status: AC
  Filled 2018-12-09: qty 20

## 2018-12-09 MED ORDER — BUPIVACAINE LIPOSOME 1.3 % IJ SUSP
20.0000 mL | INTRAMUSCULAR | Status: AC
  Administered 2018-12-09: 20 mL
  Filled 2018-12-09: qty 20

## 2018-12-09 MED ORDER — ONDANSETRON HCL 4 MG/2ML IJ SOLN
4.0000 mg | Freq: Four times a day (QID) | INTRAMUSCULAR | Status: DC | PRN
  Administered 2018-12-16 – 2018-12-17 (×2): 4 mg via INTRAVENOUS
  Filled 2018-12-09 (×2): qty 2

## 2018-12-09 MED ORDER — DIPHENHYDRAMINE HCL 12.5 MG/5ML PO ELIX
12.5000 mg | ORAL_SOLUTION | Freq: Four times a day (QID) | ORAL | Status: DC | PRN
  Filled 2018-12-09: qty 5

## 2018-12-09 MED ORDER — ROCURONIUM BROMIDE 50 MG/5ML IV SOSY
PREFILLED_SYRINGE | INTRAVENOUS | Status: DC | PRN
  Administered 2018-12-09: 50 mg via INTRAVENOUS

## 2018-12-09 MED ORDER — ENOXAPARIN SODIUM 40 MG/0.4ML ~~LOC~~ SOLN
40.0000 mg | SUBCUTANEOUS | Status: DC
  Administered 2018-12-10 – 2019-01-06 (×25): 40 mg via SUBCUTANEOUS
  Filled 2018-12-09 (×28): qty 0.4

## 2018-12-09 MED ORDER — PROMETHAZINE HCL 25 MG/ML IJ SOLN: 6.2500 mg | INTRAMUSCULAR | Status: DC | PRN

## 2018-12-09 MED ORDER — CEFAZOLIN SODIUM-DEXTROSE 2-4 GM/100ML-% IV SOLN
2.0000 g | Freq: Three times a day (TID) | INTRAVENOUS | Status: AC
  Administered 2018-12-09 (×2): 2 g via INTRAVENOUS
  Filled 2018-12-09 (×3): qty 100

## 2018-12-09 MED ORDER — BISACODYL 5 MG PO TBEC
10.0000 mg | DELAYED_RELEASE_TABLET | Freq: Every day | ORAL | Status: DC
  Administered 2018-12-10 – 2018-12-26 (×15): 10 mg via ORAL
  Filled 2018-12-09 (×23): qty 2

## 2018-12-09 MED ORDER — ALBUTEROL SULFATE HFA 108 (90 BASE) MCG/ACT IN AERS
INHALATION_SPRAY | RESPIRATORY_TRACT | Status: DC | PRN
  Administered 2018-12-09 (×9): 2 via RESPIRATORY_TRACT

## 2018-12-09 MED ORDER — ONDANSETRON HCL 4 MG/2ML IJ SOLN
INTRAMUSCULAR | Status: AC
  Filled 2018-12-09: qty 2

## 2018-12-09 MED ORDER — SODIUM CHLORIDE (PF) 0.9 % IJ SOLN
INTRAMUSCULAR | Status: DC | PRN
  Administered 2018-12-09: 50 mL via INTRAVENOUS

## 2018-12-09 MED ORDER — NALOXONE HCL 0.4 MG/ML IJ SOLN: 0.4000 mg | INTRAMUSCULAR | Status: DC | PRN

## 2018-12-09 MED ORDER — MAGNESIUM SULFATE IN D5W 1-5 GM/100ML-% IV SOLN
1.0000 g | INTRAVENOUS | Status: AC
  Administered 2018-12-09: 1 g via INTRAVENOUS
  Filled 2018-12-09: qty 100

## 2018-12-09 MED ORDER — ASPIRIN EC 81 MG PO TBEC
81.0000 mg | DELAYED_RELEASE_TABLET | Freq: Every day | ORAL | Status: DC
  Administered 2018-12-10 – 2019-01-06 (×28): 81 mg via ORAL
  Filled 2018-12-09 (×28): qty 1

## 2018-12-09 MED ORDER — 0.9 % SODIUM CHLORIDE (POUR BTL) OPTIME
TOPICAL | Status: DC | PRN
  Administered 2018-12-09: 1000 mL

## 2018-12-09 MED ORDER — SUGAMMADEX SODIUM 200 MG/2ML IV SOLN
INTRAVENOUS | Status: DC | PRN
  Administered 2018-12-09: 180 mg via INTRAVENOUS

## 2018-12-09 MED ORDER — ACETAMINOPHEN 160 MG/5ML PO SOLN: 1000.0000 mg | Freq: Four times a day (QID) | ORAL | Status: DC

## 2018-12-09 MED ORDER — SODIUM CHLORIDE 0.9% FLUSH: 9.0000 mL | INTRAVENOUS | Status: DC | PRN

## 2018-12-09 MED ORDER — DEXMEDETOMIDINE HCL IN NACL 200 MCG/50ML IV SOLN
INTRAVENOUS | Status: DC | PRN
  Administered 2018-12-09: 4 ug via INTRAVENOUS
  Administered 2018-12-09 (×2): 8 ug via INTRAVENOUS
  Administered 2018-12-09: 4 ug via INTRAVENOUS
  Administered 2018-12-09 (×3): 8 ug via INTRAVENOUS
  Administered 2018-12-09: 4 ug via INTRAVENOUS
  Administered 2018-12-09: 8 ug via INTRAVENOUS

## 2018-12-09 MED ORDER — ROCURONIUM BROMIDE 50 MG/5ML IV SOSY
PREFILLED_SYRINGE | INTRAVENOUS | Status: AC
  Filled 2018-12-09: qty 5

## 2018-12-09 MED ORDER — METOPROLOL SUCCINATE ER 50 MG PO TB24
50.0000 mg | ORAL_TABLET | Freq: Every day | ORAL | Status: DC
  Administered 2018-12-10 – 2019-01-06 (×28): 50 mg via ORAL
  Filled 2018-12-09 (×28): qty 1

## 2018-12-09 MED ORDER — BUPIVACAINE LIPOSOME 1.3 % IJ SUSP
INTRAMUSCULAR | Status: DC | PRN
  Administered 2018-12-09: 10:00:00

## 2018-12-09 MED ORDER — MIDAZOLAM HCL 5 MG/5ML IJ SOLN
INTRAMUSCULAR | Status: DC | PRN
  Administered 2018-12-09 (×2): 1 mg via INTRAVENOUS

## 2018-12-09 MED ORDER — DIPHENHYDRAMINE HCL 50 MG/ML IJ SOLN: 12.5000 mg | Freq: Four times a day (QID) | INTRAMUSCULAR | Status: DC | PRN

## 2018-12-09 MED ORDER — POTASSIUM CHLORIDE 10 MEQ/50ML IV SOLN: 10.0000 meq | Freq: Every day | INTRAVENOUS | Status: DC | PRN

## 2018-12-09 MED ORDER — DEXAMETHASONE SODIUM PHOSPHATE 10 MG/ML IJ SOLN
INTRAMUSCULAR | Status: AC
  Filled 2018-12-09: qty 1

## 2018-12-09 MED ORDER — ACETAMINOPHEN 500 MG PO TABS
1000.0000 mg | ORAL_TABLET | Freq: Four times a day (QID) | ORAL | Status: DC | PRN
  Administered 2018-12-13 – 2018-12-23 (×6): 1000 mg via ORAL
  Filled 2018-12-09 (×8): qty 2

## 2018-12-09 MED ORDER — DEXAMETHASONE SODIUM PHOSPHATE 10 MG/ML IJ SOLN
INTRAMUSCULAR | Status: DC | PRN
  Administered 2018-12-09: 10 mg via INTRAVENOUS

## 2018-12-09 MED ORDER — FENTANYL CITRATE (PF) 250 MCG/5ML IJ SOLN
INTRAMUSCULAR | Status: AC
  Filled 2018-12-09: qty 5

## 2018-12-09 MED ORDER — HYDROMORPHONE HCL 1 MG/ML IJ SOLN
INTRAMUSCULAR | Status: AC
  Administered 2018-12-09: 0.5 mg via INTRAVENOUS
  Filled 2018-12-09: qty 1

## 2018-12-09 MED ORDER — DEXTROSE-NACL 5-0.9 % IV SOLN
INTRAVENOUS | Status: DC
  Administered 2018-12-09 – 2018-12-22 (×4): via INTRAVENOUS

## 2018-12-09 MED ORDER — LIDOCAINE 2% (20 MG/ML) 5 ML SYRINGE
INTRAMUSCULAR | Status: DC | PRN
  Administered 2018-12-09: 100 mg via INTRAVENOUS

## 2018-12-09 MED ORDER — ALBUTEROL SULFATE (2.5 MG/3ML) 0.083% IN NEBU
2.5000 mg | INHALATION_SOLUTION | Freq: Four times a day (QID) | RESPIRATORY_TRACT | Status: DC
  Administered 2018-12-09 – 2018-12-12 (×11): 2.5 mg via RESPIRATORY_TRACT
  Filled 2018-12-09 (×12): qty 3

## 2018-12-09 MED ORDER — FENTANYL CITRATE (PF) 100 MCG/2ML IJ SOLN
INTRAMUSCULAR | Status: DC | PRN
  Administered 2018-12-09: 100 ug via INTRAVENOUS
  Administered 2018-12-09 (×2): 50 ug via INTRAVENOUS

## 2018-12-09 MED ORDER — ONDANSETRON HCL 4 MG/2ML IJ SOLN
INTRAMUSCULAR | Status: DC | PRN
  Administered 2018-12-09: 4 mg via INTRAVENOUS

## 2018-12-09 MED ORDER — MEPERIDINE HCL 50 MG/ML IJ SOLN: 6.2500 mg | INTRAMUSCULAR | Status: DC | PRN

## 2018-12-09 MED ORDER — MECLIZINE HCL 25 MG PO TABS
25.0000 mg | ORAL_TABLET | Freq: Three times a day (TID) | ORAL | Status: DC | PRN
  Filled 2018-12-09: qty 1

## 2018-12-09 MED ORDER — SODIUM CHLORIDE 0.9 % IV SOLN
INTRAVENOUS | Status: DC | PRN
  Administered 2018-12-09: 50 ug/min via INTRAVENOUS

## 2018-12-09 MED ORDER — PROPOFOL 10 MG/ML IV BOLUS
INTRAVENOUS | Status: DC | PRN
  Administered 2018-12-09: 140 mg via INTRAVENOUS

## 2018-12-09 MED ORDER — LIDOCAINE 2% (20 MG/ML) 5 ML SYRINGE
INTRAMUSCULAR | Status: AC
  Filled 2018-12-09: qty 5

## 2018-12-09 MED ORDER — OXYCODONE HCL 5 MG PO TABS
5.0000 mg | ORAL_TABLET | ORAL | Status: DC | PRN
  Administered 2018-12-09 – 2018-12-16 (×4): 10 mg via ORAL
  Administered 2018-12-18: 5 mg via ORAL
  Administered 2018-12-18 – 2018-12-19 (×2): 10 mg via ORAL
  Administered 2018-12-19 – 2018-12-20 (×3): 5 mg via ORAL
  Administered 2018-12-20 – 2018-12-23 (×5): 10 mg via ORAL
  Administered 2018-12-23: 5 mg via ORAL
  Administered 2018-12-24 – 2019-01-05 (×26): 10 mg via ORAL
  Filled 2018-12-09 (×3): qty 2
  Filled 2018-12-09: qty 1
  Filled 2018-12-09 (×11): qty 2
  Filled 2018-12-09 (×2): qty 1
  Filled 2018-12-09 (×20): qty 2
  Filled 2018-12-09: qty 1
  Filled 2018-12-09 (×2): qty 2
  Filled 2018-12-09: qty 1
  Filled 2018-12-09 (×2): qty 2

## 2018-12-09 MED ORDER — ONDANSETRON HCL 4 MG/2ML IJ SOLN: 4.0000 mg | Freq: Four times a day (QID) | INTRAMUSCULAR | Status: DC | PRN

## 2018-12-09 MED ORDER — ACETAMINOPHEN 500 MG PO TABS
1000.0000 mg | ORAL_TABLET | Freq: Four times a day (QID) | ORAL | Status: DC
  Administered 2018-12-09 – 2018-12-12 (×4): 1000 mg via ORAL
  Filled 2018-12-09 (×5): qty 2

## 2018-12-09 MED ORDER — LACTATED RINGERS IV SOLN
INTRAVENOUS | Status: DC | PRN
  Administered 2018-12-09 (×2): via INTRAVENOUS

## 2018-12-09 MED ORDER — BUPIVACAINE HCL (PF) 0.5 % IJ SOLN
INTRAMUSCULAR | Status: DC | PRN
  Administered 2018-12-09: 30 mL

## 2018-12-09 SURGICAL SUPPLY — 89 items
CANISTER SUCT 3000ML PPV (MISCELLANEOUS) ×6 IMPLANT
CATH THORACIC 28FR (CATHETERS) IMPLANT
CATH THORACIC 36FR (CATHETERS) IMPLANT
CATH THORACIC 36FR RT ANG (CATHETERS) IMPLANT
CLIP VESOCCLUDE MED 6/CT (CLIP) ×3 IMPLANT
CONN ST 1/4X3/8  BEN (MISCELLANEOUS)
CONN ST 1/4X3/8 BEN (MISCELLANEOUS) IMPLANT
CONN Y 3/8X3/8X3/8  BEN (MISCELLANEOUS)
CONN Y 3/8X3/8X3/8 BEN (MISCELLANEOUS) IMPLANT
CONT SPEC 4OZ CLIKSEAL STRL BL (MISCELLANEOUS) ×8 IMPLANT
COVER SURGICAL LIGHT HANDLE (MISCELLANEOUS) ×3 IMPLANT
COVER WAND RF STERILE (DRAPES) ×3 IMPLANT
DERMABOND ADHESIVE PROPEN (GAUZE/BANDAGES/DRESSINGS) ×2
DERMABOND ADVANCED (GAUZE/BANDAGES/DRESSINGS) ×2
DERMABOND ADVANCED .7 DNX12 (GAUZE/BANDAGES/DRESSINGS) ×1 IMPLANT
DERMABOND ADVANCED .7 DNX6 (GAUZE/BANDAGES/DRESSINGS) IMPLANT
DRAIN CHANNEL 28F RND 3/8 FF (WOUND CARE) IMPLANT
DRAIN CHANNEL 32F RND 10.7 FF (WOUND CARE) IMPLANT
DRAPE CV SPLIT W-CLR ANES SCRN (DRAPES) ×3 IMPLANT
DRAPE ORTHO SPLIT 77X108 STRL (DRAPES) ×2
DRAPE SURG ORHT 6 SPLT 77X108 (DRAPES) ×1 IMPLANT
DRAPE WARM FLUID 44X44 (DRAPE) ×3 IMPLANT
ELECT BLADE 6.5 EXT (BLADE) ×3 IMPLANT
ELECT REM PT RETURN 9FT ADLT (ELECTROSURGICAL) ×3
ELECTRODE REM PT RTRN 9FT ADLT (ELECTROSURGICAL) ×1 IMPLANT
GAUZE SPONGE 4X4 12PLY STRL (GAUZE/BANDAGES/DRESSINGS) ×2 IMPLANT
GLOVE SURG SIGNA 7.5 PF LTX (GLOVE) ×6 IMPLANT
GOWN STRL REUS W/ TWL LRG LVL3 (GOWN DISPOSABLE) ×2 IMPLANT
GOWN STRL REUS W/ TWL XL LVL3 (GOWN DISPOSABLE) ×2 IMPLANT
GOWN STRL REUS W/TWL LRG LVL3 (GOWN DISPOSABLE) ×4
GOWN STRL REUS W/TWL XL LVL3 (GOWN DISPOSABLE) ×4
HANDLE STAPLE ENDO GIA SHORT (STAPLE) ×2
HEMOSTAT SURGICEL 2X14 (HEMOSTASIS) IMPLANT
KIT BASIN OR (CUSTOM PROCEDURE TRAY) ×3 IMPLANT
KIT SUCTION CATH 14FR (SUCTIONS) ×3 IMPLANT
KIT TURNOVER KIT B (KITS) ×3 IMPLANT
NDL SPNL 18GX3.5 QUINCKE PK (NEEDLE) IMPLANT
NEEDLE SPNL 18GX3.5 QUINCKE PK (NEEDLE) IMPLANT
NS IRRIG 1000ML POUR BTL (IV SOLUTION) ×9 IMPLANT
PACK CHEST (CUSTOM PROCEDURE TRAY) ×3 IMPLANT
PAD ARMBOARD 7.5X6 YLW CONV (MISCELLANEOUS) ×6 IMPLANT
POUCH ENDO CATCH II 15MM (MISCELLANEOUS) IMPLANT
POUCH SPECIMEN RETRIEVAL 10MM (ENDOMECHANICALS) IMPLANT
RELOAD TRI 60 ART MED THCK BLK (STAPLE) ×4 IMPLANT
RELOAD TRI 60 ART MED THCK PUR (STAPLE) ×16 IMPLANT
SCISSORS ENDO CVD 5DCS (MISCELLANEOUS) IMPLANT
SEALANT PROGEL (MISCELLANEOUS) ×2 IMPLANT
SEALANT SURG COSEAL 4ML (VASCULAR PRODUCTS) IMPLANT
SEALANT SURG COSEAL 8ML (VASCULAR PRODUCTS) IMPLANT
SHEARS HARMONIC HDI 20CM (ELECTROSURGICAL) IMPLANT
SOLUTION ANTI FOG 6CC (MISCELLANEOUS) ×3 IMPLANT
SPECIMEN JAR MEDIUM (MISCELLANEOUS) ×3 IMPLANT
SPONGE INTESTINAL PEANUT (DISPOSABLE) ×5 IMPLANT
SPONGE TONSIL TAPE 1 RFD (DISPOSABLE) ×5 IMPLANT
STAPLER ENDO GIA 12 SHRT THIN (STAPLE) IMPLANT
STAPLER ENDO GIA 12MM SHORT (STAPLE) ×1 IMPLANT
SUT PROLENE 4 0 RB 1 (SUTURE)
SUT PROLENE 4-0 RB1 .5 CRCL 36 (SUTURE) IMPLANT
SUT SILK  1 MH (SUTURE) ×4
SUT SILK 1 MH (SUTURE) ×2 IMPLANT
SUT SILK 1 TIES 10X30 (SUTURE) ×3 IMPLANT
SUT SILK 2 0 SH (SUTURE) IMPLANT
SUT SILK 2 0 SH CR/8 (SUTURE) ×2 IMPLANT
SUT SILK 2 0SH CR/8 30 (SUTURE) IMPLANT
SUT SILK 3 0 SH 30 (SUTURE) IMPLANT
SUT SILK 3 0SH CR/8 30 (SUTURE) ×3 IMPLANT
SUT VIC AB 0 CTX 27 (SUTURE) IMPLANT
SUT VIC AB 1 CTX 27 (SUTURE) ×5 IMPLANT
SUT VIC AB 2-0 CT1 27 (SUTURE)
SUT VIC AB 2-0 CT1 TAPERPNT 27 (SUTURE) IMPLANT
SUT VIC AB 2-0 CTX 36 (SUTURE) ×8 IMPLANT
SUT VIC AB 3-0 MH 27 (SUTURE) IMPLANT
SUT VIC AB 3-0 SH 27 (SUTURE)
SUT VIC AB 3-0 SH 27X BRD (SUTURE) IMPLANT
SUT VIC AB 3-0 X1 27 (SUTURE) ×5 IMPLANT
SUT VICRYL 0 UR6 27IN ABS (SUTURE) IMPLANT
SUT VICRYL 2 TP 1 (SUTURE) IMPLANT
SYR 30ML LL (SYRINGE) ×3 IMPLANT
SYSTEM SAHARA CHEST DRAIN ATS (WOUND CARE) ×3 IMPLANT
TAPE CLOTH SURG 4X10 WHT LF (GAUZE/BANDAGES/DRESSINGS) ×2 IMPLANT
TIP APPLICATOR SPRAY EXTEND 16 (VASCULAR PRODUCTS) IMPLANT
TOWEL GREEN STERILE (TOWEL DISPOSABLE) ×3 IMPLANT
TOWEL GREEN STERILE FF (TOWEL DISPOSABLE) ×3 IMPLANT
TRAY FOLEY MTR SLVR 16FR STAT (SET/KITS/TRAYS/PACK) ×3 IMPLANT
TROCAR BLADELESS 5M (ENDOMECHANICALS) ×2 IMPLANT
TROCAR BLADELESS 5MM (ENDOMECHANICALS) ×4 IMPLANT
TROCAR XCEL BLADELESS 5X75MML (TROCAR) ×3 IMPLANT
TROCAR XCEL NON-BLD 5MMX100MML (ENDOMECHANICALS) IMPLANT
WATER STERILE IRR 1000ML POUR (IV SOLUTION) ×6 IMPLANT

## 2018-12-09 NOTE — Anesthesia Procedure Notes (Signed)
Arterial Line Insertion Start/End3/08/2019 7:50 AM, 12/09/2018 8:15 AM Performed by: Josephine Igo, CRNA, CRNA  Patient location: Pre-op. Preanesthetic checklist: patient identified, IV checked, site marked, risks and benefits discussed, surgical consent, monitors and equipment checked, pre-op evaluation, timeout performed and anesthesia consent Lidocaine 1% used for infiltration Left, radial was placed Catheter size: 20 G Hand hygiene performed , maximum sterile barriers used  and Seldinger technique used  Attempts: 4 Procedure performed without using ultrasound guided technique. Following insertion, dressing applied and Biopatch. Post procedure assessment: normal and unchanged  Patient tolerated the procedure well with no immediate complications.

## 2018-12-09 NOTE — Transfer of Care (Signed)
Immediate Anesthesia Transfer of Care Note  Patient: Randall Sullivan  Procedure(s) Performed: VIDEO ASSISTED THORACOSCOPY (VATS)/WEDGE RESECTION (Right Chest)  Patient Location: PACU  Anesthesia Type:General  Level of Consciousness: drowsy and patient cooperative  Airway & Oxygen Therapy: Patient Spontanous Breathing and Patient connected to nasal cannula oxygen  Post-op Assessment: Report given to RN and Post -op Vital signs reviewed and stable  Post vital signs: Reviewed and stable  Last Vitals:  Vitals Value Taken Time  BP 106/74 12/09/2018 11:17 AM  Temp    Pulse 67 12/09/2018 11:20 AM  Resp 17 12/09/2018 11:20 AM  SpO2 100 % 12/09/2018 11:20 AM  Vitals shown include unvalidated device data.  Last Pain:  Vitals:   12/09/18 0656  PainSc: 0-No pain         Complications: No apparent anesthesia complications

## 2018-12-09 NOTE — Interval H&P Note (Signed)
History and Physical Interval Note:  12/09/2018 7:53 AM  Randall Sullivan  has presented today for surgery, with the diagnosis of RUL NODULE.  The various methods of treatment have been discussed with the patient and family. After consideration of risks, benefits and other options for treatment, the patient has consented to  Procedure(s): VIDEO ASSISTED THORACOSCOPY (VATS)/WEDGE RESECTION (Right) as a surgical intervention.  The patient's history has been reviewed, patient examined, no change in status, stable for surgery.  I have reviewed the patient's chart and labs.  Questions were answered to the patient's satisfaction.     [name]

## 2018-12-09 NOTE — Anesthesia Postprocedure Evaluation (Signed)
Anesthesia Post Note  Patient: Randall Sullivan  Procedure(s) Performed: VIDEO ASSISTED THORACOSCOPY (VATS)/WEDGE RESECTION (Right Chest)     Patient location during evaluation: PACU Anesthesia Type: General Level of consciousness: sedated and patient cooperative Pain management: pain level controlled Vital Signs Assessment: post-procedure vital signs reviewed and stable Respiratory status: spontaneous breathing Cardiovascular status: stable Anesthetic complications: no    Last Vitals:  Vitals:   12/09/18 1212 12/09/18 1226  BP: 101/67 106/73  Pulse: 63 87  Resp: 12   Temp:  36.4 C  SpO2: 98% 96%    Last Pain:  Vitals:   12/09/18 1226  TempSrc: Oral  PainSc:                  Nolon Nations

## 2018-12-09 NOTE — Anesthesia Procedure Notes (Addendum)
Central Venous Catheter Insertion Performed by: Nolon Nations, MD, anesthesiologist Start/End3/08/2019 8:08 AM, 12/09/2018 8:20 AM Patient location: Pre-op. Preanesthetic checklist: patient identified, IV checked, site marked, risks and benefits discussed, surgical consent, monitors and equipment checked, pre-op evaluation, timeout performed and anesthesia consent Lidocaine 1% used for infiltration and patient sedated Hand hygiene performed  and maximum sterile barriers used  Catheter size: 8 Fr Total catheter length 16. Central line was placed.Double lumen Procedure performed using ultrasound guided technique. Ultrasound Notes:image(s) printed for medical record Attempts: 1 Following insertion, dressing applied, line sutured and Biopatch. Post procedure assessment: blood return through all ports, free fluid flow and no air  Patient tolerated the procedure well with no immediate complications.

## 2018-12-09 NOTE — Progress Notes (Signed)
Patient interviewed in the preop area. Patient able to confirm name, DOB, procedure, NDKA, no metal in body, npo status and no pain at this time. Family at bedside.   Leatha Gilding, RN

## 2018-12-09 NOTE — Brief Op Note (Addendum)
12/09/2018  10:41 AM  PATIENT:  Randall Sullivan  68 y.o. 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), right upper lobe blebs  PROCEDURE:  Procedure(s):  VIDEO ASSISTED THORACOSCOPY  -Wedge Resection RUL -Blebectomy RUL -Intercostal Nerve Block  SURGEON:  Surgeon(s) and Role:    * [name], MD - Primary  PHYSICIAN ASSISTANT: Ellwood Handler PA-C  ANESTHESIA:   general  JJO:ACZYSAY  BLOOD ADMINISTERED:none  DRAINS: 28 Straight Chest Tube   LOCAL MEDICATIONS USED:  MARCAINE    and BUPIVICAINE   SPECIMEN:  Source of Specimen:  Wedge Resection RUL, Bleb RUL  DISPOSITION OF SPECIMEN:  PATHOLOGY  COUNTS:  YES  TOURNIQUET:  * No tourniquets in log *  DICTATION: .Dragon Dictation  PLAN OF CARE: Admit to inpatient   PATIENT DISPOSITION:  PACU - hemodynamically stable.   Delay start of Pharmacological VTE agent (>24hrs) due to surgical blood loss or risk of bleeding: no

## 2018-12-09 NOTE — Anesthesia Procedure Notes (Signed)
Procedure Name: Intubation Date/Time: 12/09/2018 8:48 AM Performed by: Lance Coon, CRNA Pre-anesthesia Checklist: Patient identified, Emergency Drugs available, Suction available, Timeout performed and Patient being monitored Patient Re-evaluated:Patient Re-evaluated prior to induction Oxygen Delivery Method: Circle system utilized Preoxygenation: Pre-oxygenation with 100% oxygen Induction Type: IV induction Ventilation: Mask ventilation without difficulty Laryngoscope Size: Mac and 4 Grade View: Grade I Endobronchial tube: Left, Double lumen EBT, EBT position confirmed by auscultation and EBT position confirmed by fiberoptic bronchoscope and 39 Fr Number of attempts: 1 Airway Equipment and Method: Stylet and Fiberoptic brochoscope Placement Confirmation: positive ETCO2 and breath sounds checked- equal and bilateral Tube secured with: Tape Dental Injury: Teeth and Oropharynx as per pre-operative assessment

## 2018-12-10 ENCOUNTER — Encounter (HOSPITAL_COMMUNITY): Payer: Self-pay | Admitting: Thoracic Surgery (Cardiothoracic Vascular Surgery)

## 2018-12-10 ENCOUNTER — Inpatient Hospital Stay (HOSPITAL_COMMUNITY): Payer: Medicare Other

## 2018-12-10 LAB — BLOOD GAS, ARTERIAL
Acid-Base Excess: 3.1 mmol/L — ABNORMAL HIGH (ref 0.0–2.0)
Bicarbonate: 27.6 mmol/L (ref 20.0–28.0)
O2 Content: 2 L/min
O2 Saturation: 96.6 %
PATIENT TEMPERATURE: 98.6
PO2 ART: 84.4 mmHg (ref 83.0–108.0)
pCO2 arterial: 45.7 mmHg (ref 32.0–48.0)
pH, Arterial: 7.398 (ref 7.350–7.450)

## 2018-12-10 LAB — BASIC METABOLIC PANEL
Anion gap: 7 (ref 5–15)
BUN: 10 mg/dL (ref 8–23)
CO2: 26 mmol/L (ref 22–32)
Calcium: 8.1 mg/dL — ABNORMAL LOW (ref 8.9–10.3)
Chloride: 105 mmol/L (ref 98–111)
Creatinine, Ser: 0.86 mg/dL (ref 0.61–1.24)
GFR calc Af Amer: >60 mL/min (ref >60)
Glucose, Bld: 130 mg/dL — ABNORMAL HIGH (ref 70–99)
Potassium: 4 mmol/L (ref 3.5–5.1)
Sodium: 138 mmol/L (ref 135–145)

## 2018-12-10 LAB — CBC
HCT: 37 % — ABNORMAL LOW (ref 39.0–52.0)
Hemoglobin: 11.8 g/dL — ABNORMAL LOW (ref 13.0–17.0)
MCH: 27.3 pg (ref 26.0–34.0)
MCHC: 31.9 g/dL (ref 30.0–36.0)
MCV: 85.5 fL (ref 80.0–100.0)
Platelets: 267 10*3/uL (ref 150–400)
RBC: 4.33 MIL/uL (ref 4.22–5.81)
RDW: 14.3 % (ref 11.5–15.5)
WBC: 18.9 10*3/uL — ABNORMAL HIGH (ref 4.0–10.5)
nRBC: 0 % (ref 0.0–0.2)

## 2018-12-10 MED ORDER — ALBUTEROL SULFATE (2.5 MG/3ML) 0.083% IN NEBU
2.5000 mg | INHALATION_SOLUTION | RESPIRATORY_TRACT | Status: DC | PRN
  Administered 2018-12-10 – 2018-12-12 (×5): 2.5 mg via RESPIRATORY_TRACT
  Filled 2018-12-10 (×5): qty 3

## 2018-12-10 NOTE — Discharge Summary (Addendum)
Physician Discharge Summary  Patient ID: Randall Sullivan MRN: [id number] DOB/AGE: 1951/09/23 68 y.o.  Admit date: 12/09/2018 Discharge date: 01/06/2019  Admission Diagnoses:  Patient Active Problem List   Diagnosis Date Noted  . Primary lung adenocarcinoma ([place]) 10/15/2016  . PAF (paroxysmal atrial fibrillation) ([place]) 09/16/2016  . Encounter for therapeutic drug monitoring 09/16/2016  . S/P lobectomy of lung 08/05/2016  . Cancer of left lung ([place]) 08/01/2016  . Lung nodule 06/11/2016  . Former smoker 06/11/2016  . Emphysema of lung ([place]) 06/11/2016  . Essential (primary) hypertension 10/06/2014   Discharge Diagnoses:   Patient Active Problem List   Diagnosis Date Noted  . S/P Wedge Resection RUL, Blebectomy RUL 12/09/2018  . Primary lung adenocarcinoma ([place]) 10/15/2016  . PAF (paroxysmal atrial fibrillation) ([place]) 09/16/2016  . Encounter for therapeutic drug monitoring 09/16/2016  . S/P lobectomy of lung 08/05/2016  . Cancer of left lung ([place]) 08/01/2016  . Lung nodule 06/11/2016  . Former smoker 06/11/2016  . Emphysema of lung ([place]) 06/11/2016  . Essential (primary) hypertension 10/06/2014   Discharged Condition: good  History of Present Illness:  [name] is a 68 yo white male with PMH of tobacco abuse, COPD, Stage IA lung cancer, HTN, PAF, and arthtritis.  He underwent a left upper lobectomy in 2017.  Since that time he has been routinely followed since that time.  CT scan in December showed a new RUL lung nodule.  PET CT scan was obtained and showed the lesion to be hypermetabolic.  He was again referred to Dr. Roxan Hockey for surgical resection.  He stated that he feels well.  He denies respiratory issues and feels his breathing is better than his it was prior to his surgery.  He has remained smoke free since that time.  He denies chest pain and weight changes.  It was felt the patient should undergo surgical resection.  The risks and benefits of the procedure were explained to  the patient and he was agreeable to proceed.   Hospital Course:   Mr. Franta presented to [facility] on 12/10/2018.  He was taken to the operating room and underwent Right VATS with wedge resection right upper lobe.  He also underwent resection of right apical bleb.  He tolerated the procedure without difficulty, was extubated and taken to the PACU in stable condition.  He had an air leak post operatively and his chest tube was left on suction.  CXR showed extensive sub cutaneous emphysema.  He had a persistent air leak. He underwent placement of endobronchial valve placement in anterior, apical, and posterior segments of right upper lobe and superior segment of right lower lobe on 03/13.  There was improvement in sub q emphysema.  He was taken back to the PACU in stable condition.  His air leak persisted and got slightly worse.  The patient required his chest tube to be placed to increased suction at 40 cm.  He ultimately required placement of an additional right sided chest tube.  He developed atrial fibrillation with RVR he was treated with Lopressor and Cardizem with conversion to NSR.  There was no improvement in sub q air after placement of chest tube.  It was felt he would require redo VATS.  However on the day of planned surgery he spiked a temperature of 103.  He was checked for influenza and was positive.  He was started on Tamiflu.  He was also treated with broad spectrum ABX for possible pneumonia.  Due to this surgery  was delayed.  The patient developed acute respiratory distress likely due to viral pneumonia and "kinked" chest tube.  He was transferred to ICU and Critical care consult was obtained.  He was placed on humidified high flow mask with improvement in shortness of breath.  His sub q air improved and his chest tubes were left on suction.  His air leak did not resolved.  He was taken back to the operating room on 12/21/2018.  He underwent resection of bleb from superior segment of  right lower lobe and bronchoscopy.  He tolerated the procedure without difficulty, was extubated and taken to the SICU in stable condition.  He did well post operatively.  There was significant improvement in his sub q air.  His chest tube initially had an air leak.  This resolved and the patients chest tube was placed to water seal on 12/24/2018.  He unfortunately developed another air leak and his chest tube was placed back on suction on 12/29/2018.   CXR developed an apical pneumothorax, which has remained stable.  His air leak persisted and he was placed on water seal.  CXR remained stable.  They attempted to clamp the chest tube which resulted in an increase in his pneumothorax and development of sub q emphysema.  Due to this, the patient was transitioned to a [employer].  Follow up CXR has remained stable in appearance.   He has been weaned down to 3L of oxygen.  He will require use at discharge and arrangements have been made.  He will require home health nursing also which has been arranged.  He is ambulating independently.  His incisions are healing without evidence of infection.  He is medically stable for discharge home today.          Consults: pulmonary/intensive care  Significant Diagnostic Studies: radiology:   PET scan: 1. The 8 by 5 mm right upper lobe pulmonary nodule is focally hypermetabolic. Although the SUV is only 1.9, this nodule is at the lower size limits for sensitive assessment by PET-CT, and accordingly even at this lower SUV concern is raised for the possibility of malignancy. The lesion has also increased in size compared to 04/09/2017, suspicious for low-grade malignancy. I am skeptical that percutaneous biopsy would be feasible, and wedge resection likewise is potentially on feasible given the obviously low respiratory reserve based on the degree of emphysema and prior left upper lobectomy. Possibilities might include further surveillance for presumptive  treatment with radiation therapy. This case might benefit from discussion at the multidisciplinary thoracic oncology conference. 2. Infrarenal abdominal aortic aneurysm, 3.6 cm in diameter. Recommend followup by [place] in 2 years. This recommendation follows ACR consensus guidelines: White Paper of the ACR Incidental Findings Committee II on Vascular Findings. J Am Coll Radiol 2013; 10:789-794. 3. Other imaging findings of potential clinical significance: Aortic Atherosclerosis (ICD10-I70.0) and Emphysema (ICD10-J43.9). Coronary atherosclerosis. Left upper lobectomy  Treatments: surgery:    Right video-assisted thoracoscopy, wedge resection, right upper lobe nodule, bleb resection and intercostal nerve blocks at levels 3 through 9.  Redo right video-assisted thoracoscopy, stapling of bleb superior segment right lower lobe, and bronchoscopy.  Pathology:  1. Lung, wedge biopsy/resection, Right upper lobe - INVASIVE MODERATELY DIFFERENTIATED ADENOCARCINOMA, 0.7 CM, ACINAR PREDOMINANT. - NEGATIVE FOR VISCERAL PLEURAL INVASION. - RESECTION MARGINS ARE NEGATIVE FOR CARCINOMA. - NEGATIVE FOR LYMPHOVASCULAR OR PERINEURAL INVASION. - SEE ONCOLOGY TABLE. 2. Lung, bleb(s) / bullae, Right Upper lobe - BENIGN PLEURAL BLEBS. - NEGATIVE FOR CARCINOMA. 3. Lymph node,  biopsy, Level 10 - LYMPH NODE, NEGATIVE FOR CARCINOMA (0/1).  Lung, bleb(s) / bullae, right lower lobe superior segment - PLEURAL BULLA, 2.4 CM, WITH NONSPECIFIC INFLAMMATORY CHANGES - NO EVIDENCE OF MALIGNANCY  Discharge Exam: Blood pressure 119/64, pulse 71, temperature 98 F (36.7 C), temperature source Oral, resp. rate (!) 23, height 5\' 10"  (1.778 m), weight 79 kg, SpO2 98 %.  General appearance: alert, cooperative and no distress Heart: regular rate and rhythm Lungs: clear to auscultation bilaterally Abdomen: soft, non-tender; bowel sounds normal; no masses,  no organomegaly Extremities: extremities normal, atraumatic, no  cyanosis or edema Wound: clean and dry  Disposition: Discharge disposition: 01-Home or Self Care     Home  Discharge Medications:   Allergies as of 01/06/2019      Reactions   No Known Allergies       Medication List    TAKE these medications   acetaminophen 500 MG tablet Commonly known as:  TYLENOL Take 1,000 mg by mouth every 6 (six) hours as needed (for arthritis pain.).   albuterol (2.5 MG/3ML) 0.083% nebulizer solution Commonly known as:  PROVENTIL Take 2.5 mg by nebulization 4 (four) times daily.   aspirin EC 81 MG tablet Take 81 mg by mouth daily.   cloNIDine 0.1 MG tablet Commonly known as:  CATAPRES Take 1 tablet (0.1 mg total) by mouth daily.   diltiazem 60 MG 12 hr capsule Commonly known as:  CARDIZEM SR Take 1 capsule (60 mg total) by mouth every 12 (twelve) hours.   fexofenadine 180 MG tablet Commonly known as:  ALLEGRA Take 180 mg by mouth daily as needed for allergies or rhinitis.   guaiFENesin 600 MG 12 hr tablet Commonly known as:  MUCINEX Take 1 tablet (600 mg total) by mouth 2 (two) times daily as needed.   meclizine 25 MG tablet Commonly known as:  ANTIVERT Take 1 tablet (25 mg total) by mouth 3 (three) times daily as needed for dizziness. May cause drowsiness   metoprolol succinate 50 MG 24 hr tablet Commonly known as:  TOPROL-XL Take 50 mg by mouth daily. Take with or immediately following a meal.   oxyCODONE 5 MG immediate release tablet Commonly known as:  Oxy IR/ROXICODONE Take 1-2 tablets (5-10 mg total) by mouth every 4 (four) hours as needed for severe pain.            Durable Medical Equipment  (From admission, onward)         Start     Ordered   01/06/19 0730  For home use only DME oxygen  Once    Question Answer Comment  Mode or (Route) Nasal cannula   Liters per Minute 3   Frequency Continuous (stationary and portable oxygen unit needed)   Oxygen conserving device No   Oxygen delivery system Gas      01/06/19  0730         Follow-up Information    [name], MD. Go on 01/12/2019.   Specialty:  Cardiothoracic Surgery Why:  Appointment is at 1:00, please get CXR at 12:30 at [facility] located on first floor of our office building Contact information: [place] [address] [phone]           Signed: Ellwood Handler PA-C 01/06/2019, 10:07 AM

## 2018-12-10 NOTE — Progress Notes (Addendum)
      [address]       [place] [address]             [phone]      1 Day Post-Op Procedure(s) (LRB): VIDEO ASSISTED THORACOSCOPY (VATS)/WEDGE RESECTION (Right)   Subjective:  Patient noticed he is swelling along his right side.  Pain is well controlled.  Denies N/V  Objective: Vital signs in last 24 hours: Temp:  [97.3 F (36.3 C)-98.1 F (36.7 C)] 98 F (36.7 C) (03/12 0400) Pulse Rate:  [60-87] 69 (03/12 0400) Cardiac Rhythm: Normal sinus rhythm (03/12 0400) Resp:  [11-22] 18 (03/12 0400) BP: (101-126)/(67-78) 118/74 (03/12 0400) SpO2:  [94 %-100 %] 94 % (03/12 0745) Arterial Line BP: (94-135)/(56-91) 117/73 (03/12 0400) Weight:  [93.3 kg] 93.3 kg (03/11 1226)  Intake/Output from previous day: 03/11 0701 - 03/12 0700 In: 3011.4 [P.O.:240; I.V.:2496.4; IV Piggyback:200] Out: 5364 [Urine:2900; Blood:25; Chest Tube:370]  General appearance: alert, cooperative and no distress Heart: regular rate and rhythm Lungs: wheezes bilaterally Abdomen: soft, non-tender; bowel sounds normal; no masses,  no organomegaly Extremities: extremities normal, atraumatic, no cyanosis or edema Wound: clean, dressing saturated with blood  Lab Results: Recent Labs    12/07/18 1550 12/10/18 0700  WBC 10.4 18.9*  HGB 14.4 11.8*  HCT 46.3 37.0*  PLT 336 267   BMET:  Recent Labs    12/07/18 1550  NA 137  K 4.0  CL 102  CO2 26  GLUCOSE 85  BUN 14  CREATININE 0.98  CALCIUM 8.7*    PT/INR:  Recent Labs    12/07/18 1550  LABPROT 13.3  INR 1.0   ABG    Component Value Date/Time   PHART 7.398 12/10/2018 0700   HCO3 27.6 12/10/2018 0700   O2SAT 96.6 12/10/2018 0700   CBG (last 3)  No results for input(s): GLUCAP in the last 72 hours.  Assessment/Plan: S/P Procedure(s) (LRB): VIDEO ASSISTED THORACOSCOPY (VATS)/WEDGE RESECTION (Right)  1. Chest tube-+ air leak, extensive sub q air along left side- leave on suction 2. Pulm- severe bullous  emphysema, extensive sub q air, no pneumothorax  3. CV- NSR, + HTN- continue Lopressor continue Toprol XL 4. Lovenox for DVT prophylaxis 5. D/C Arterial Line 6. Dispo- patient stable, + air leak, leave chest tube on suction, repeat CXR in AM   LOS: 1 day    Ellwood Handler 12/10/2018 Patient seen and examined, agree with above Has a sizable air leak. Lung is up but has significant SQ emphysema- will increase suction to -40 cm water  Remo Lipps C. Roxan Hockey, MD [facility] [phone]

## 2018-12-10 NOTE — Progress Notes (Signed)
Patient called, c/o neck swelling. No SOB. Sats 95 at 2L. Patient is very anxious. Gold PA paged and informed. Came and evaluated patient. See new orders.

## 2018-12-10 NOTE — Progress Notes (Signed)
Patient HR sustaining 140-150s.  BP 142/101.  Dr. Cyndia Bent notified via page.  Spoke with representative as Dr. Cyndia Bent is in surgery, who relayed the message.  Was told "he doesn't want to do anything".  Rapid Response notified as well.  Patient is in [facility] room.  Patient is asymptomatic.  No pain or discomfort.  No SOB.  Will continue to monitor patient.

## 2018-12-10 NOTE — Op Note (Signed)
NAME: Randall Sullivan, Randall Sullivan MEDICAL RECORD [id number] ACCOUNT [id number] DATE OF BIRTH:May 14, 1951 FACILITY: [facility] LOCATION: [facility] PHYSICIAN:Tenia Goh Chaya Jan, MD  OPERATIVE REPORT  DATE OF PROCEDURE:  12/09/2018  PREOPERATIVE DIAGNOSIS:  Right upper lobe lung nodule and blebs.  POSTOPERATIVE DIAGNOSES:  Adenocarcinoma, right upper lobe, clinical stage IA (T1, N0), right upper lobe blebs.  PROCEDURES:   Right video-assisted thoracoscopy, Wedge resection right upper lobe nodule, Bleb resection and Intercostal nerve blocks at levels 3 through 9.  SURGEON:  Randall Charon, MD  ASSISTANT:  Ellwood Handler, PA  ANESTHESIA:  General.  FINDINGS:  Severe bullous emphysema changes throughout, worse in the upper lobe.  Large bleb adjacent to the palpable nodule and posterior aspect of the upper lobe.  Frozen section of nodule showed adenocarcinoma.  Margin negative for tumor.  CLINICAL NOTE:  Randall Sullivan is a 68 year old gentleman with a history of an adenocarcinoma of the left upper lobe, treated with lobectomy.  It was a stage IA lesion.  He has been followed since that time.  In December, he was found to have a new right  upper lobe lung nodule.  It was mildly hypermetabolic on PET CT, which was felt to be significant given the small size of the nodule.  The patient was offered the option of radiographic observation, stereotactic radiation or surgical resection.   Indications, risks, benefits, and alternatives were discussed in detail with the patient.  He understood and accepted the risks and agreed to proceed.  OPERATIVE NOTE:  The patient was brought to the preoperative holding area on 12/09/2018.  Anesthesia placed a central line and an arterial blood pressure monitoring line.  He was taken to the operating room, anesthetized and intubated with a double lumen  endotracheal tube.  Sequential compression devices were placed on the calves for DVT prophylaxis.  A Foley catheter was placed.   Intravenous antibiotics were administered.  He was placed in a left lateral decubitus position and the right chest was  prepped and draped in the usual sterile fashion.  Single lung ventilation of the left lung was initiated.  A timeout was performed.  A solution containing 20 mL of liposomal bupivacaine, 30 mL of 0.5% bupivacaine and 50 mL of saline was prepared.  This was used for local at the incision sites as well as the intercostal nerve blocks.  The bupivacaine solution was injected prior to making the incisions.  An incision was made in the seventh interspace in the mid axillary line.  The chest was entered bluntly using a hemostat.  The 5 mm port was inserted.  The thoracoscope was advanced into the chest.  There was good isolation of the right lung, which was  slow to deflate.  A working incision was made in the 4th interspace anterolaterally.  No rib spreading was performed during the procedure.  There was severe emphysematous changes of the right upper lobe with a mass of blebs/bullae at the apex.  There  were also extensive blebs along the inferior margin.  Blebs were also noted in both the middle and lower lobes, but were not as extensive.  The right upper lobe was grasped and retracted anteriorly.  The nodule was palpable in the posterior aspect of the  right upper lobe.  There was a large bleb adjacent to this.  A wedge resection was performed.  All staple lines were pericardial reinforced.  A Covidien stapler was used with a 60 mm cartridges.  The bleb adjacent to the nodule was removed along with  the nodule.  This was sent for frozen section of the mass and the margin.  While awaiting that result, a blebectomy was performed at the apex.  This was done with sequential firings of the Covidien stapler using both purple and black pericardial reinforced  staples.  The specimen was sent for permanent pathology only.  Progel was applied to both staple lines.  The frozen section returned showing  adenocarcinoma.  The margin was clear.  Inspection in the posterior aspect around the bronchi revealed a level 10  node which was removed.  This was benign in appearance.  No level 7 node was noted.  Because of the severe bullous emphysema changes, no additional node dissection was performed.   Intercostal nerve blocks were  then performed from the third to the ninth interspace.  The needle was advanced from a posterior approach and 10 mL of the bupivacaine solution was injected into a subpleural plane at each interspace.  The chest was copiously irrigated with warm  saline.  A test inflation revealed no significant air leak, although it was hard to visualize the staple lines with reinflation.  A 28 French chest tube was placed through the original port incision.  It was secured to skin with a #1 silk suture.  The  working incision was closed in standard fashion in 3 layers.  Dermabond was applied.  The chest tube was placed to suction.  The patient was placed back in supine position.    He then was extubated in the operating room and taken to the [facility] in good condition.  AN/NUANCE  D:12/09/2018 T:12/10/2018 JOB:[id number]

## 2018-12-11 ENCOUNTER — Encounter (HOSPITAL_COMMUNITY): Payer: Self-pay | Admitting: *Deleted

## 2018-12-11 ENCOUNTER — Inpatient Hospital Stay (HOSPITAL_COMMUNITY): Payer: Medicare Other | Admitting: Anesthesiology

## 2018-12-11 ENCOUNTER — Inpatient Hospital Stay (HOSPITAL_COMMUNITY): Payer: Medicare Other

## 2018-12-11 ENCOUNTER — Encounter (HOSPITAL_COMMUNITY)
Admission: RE | Disposition: A | Discharge status: Home Health | Payer: Self-pay | Source: Home / Self Care | Attending: Thoracic Surgery (Cardiothoracic Vascular Surgery)

## 2018-12-11 DIAGNOSIS — J95811 Postprocedural pneumothorax: Secondary | ICD-10-CM

## 2018-12-11 HISTORY — PX: VIDEO BRONCHOSCOPY WITH INSERTION OF INTERBRONCHIAL VALVE (IBV): SHX6178

## 2018-12-11 LAB — CBC
HCT: 43 % (ref 39.0–52.0)
Hemoglobin: 13.5 g/dL (ref 13.0–17.0)
MCH: 27.1 pg (ref 26.0–34.0)
MCHC: 31.4 g/dL (ref 30.0–36.0)
MCV: 86.2 fL (ref 80.0–100.0)
PLATELETS: 332 10*3/uL (ref 150–400)
RBC: 4.99 MIL/uL (ref 4.22–5.81)
RDW: 14.5 % (ref 11.5–15.5)
WBC: 20.1 10*3/uL — ABNORMAL HIGH (ref 4.0–10.5)
nRBC: 0 % (ref 0.0–0.2)

## 2018-12-11 LAB — COMPREHENSIVE METABOLIC PANEL
ALT: 15 U/L (ref 0–44)
AST: 26 U/L (ref 15–41)
Albumin: 3.2 g/dL — ABNORMAL LOW (ref 3.5–5.0)
Alkaline Phosphatase: 44 U/L (ref 38–126)
Anion gap: 7 (ref 5–15)
BUN: 12 mg/dL (ref 8–23)
CALCIUM: 8.1 mg/dL — AB (ref 8.9–10.3)
CO2: 25 mmol/L (ref 22–32)
CREATININE: 1.01 mg/dL (ref 0.61–1.24)
Chloride: 101 mmol/L (ref 98–111)
GFR calc Af Amer: >60 mL/min (ref >60)
GFR calc non Af Amer: >60 mL/min (ref >60)
Glucose, Bld: 98 mg/dL (ref 70–99)
Potassium: 3.8 mmol/L (ref 3.5–5.1)
SODIUM: 133 mmol/L — AB (ref 135–145)
Total Bilirubin: 1.4 mg/dL — ABNORMAL HIGH (ref 0.3–1.2)
Total Protein: 6.5 g/dL (ref 6.5–8.1)

## 2018-12-11 LAB — GLUCOSE, CAPILLARY
Glucose-Capillary: 114 mg/dL — ABNORMAL HIGH (ref 70–99)
Glucose-Capillary: 116 mg/dL — ABNORMAL HIGH (ref 70–99)
Glucose-Capillary: 135 mg/dL — ABNORMAL HIGH (ref 70–99)

## 2018-12-11 SURGERY — BRONCHOSCOPY, FLEXIBLE, WITH INTRABRONCHIAL VALVE INSERTION
Anesthesia: General

## 2018-12-11 MED ORDER — PROPOFOL 10 MG/ML IV BOLUS
INTRAVENOUS | Status: DC | PRN
  Administered 2018-12-11: 20 mg via INTRAVENOUS
  Administered 2018-12-11: 40 mg via INTRAVENOUS
  Administered 2018-12-11 (×2): 20 mg via INTRAVENOUS
  Administered 2018-12-11: 120 mg via INTRAVENOUS

## 2018-12-11 MED ORDER — ALBUTEROL SULFATE (2.5 MG/3ML) 0.083% IN NEBU
2.5000 mg | INHALATION_SOLUTION | Freq: Four times a day (QID) | RESPIRATORY_TRACT | Status: DC | PRN
  Administered 2018-12-11: 2.5 mg via RESPIRATORY_TRACT

## 2018-12-11 MED ORDER — ROCURONIUM BROMIDE 10 MG/ML (PF) SYRINGE
PREFILLED_SYRINGE | INTRAVENOUS | Status: DC | PRN
  Administered 2018-12-11: 30 mg via INTRAVENOUS

## 2018-12-11 MED ORDER — FENTANYL CITRATE (PF) 100 MCG/2ML IJ SOLN
INTRAMUSCULAR | Status: AC
  Filled 2018-12-11: qty 2

## 2018-12-11 MED ORDER — PROPOFOL 10 MG/ML IV BOLUS
INTRAVENOUS | Status: AC
  Filled 2018-12-11: qty 20

## 2018-12-11 MED ORDER — DEXAMETHASONE SODIUM PHOSPHATE 10 MG/ML IJ SOLN
INTRAMUSCULAR | Status: DC | PRN
  Administered 2018-12-11: 10 mg via INTRAVENOUS

## 2018-12-11 MED ORDER — LIDOCAINE 2% (20 MG/ML) 5 ML SYRINGE
INTRAMUSCULAR | Status: DC | PRN
  Administered 2018-12-11: 60 mg via INTRAVENOUS

## 2018-12-11 MED ORDER — ALBUTEROL SULFATE (2.5 MG/3ML) 0.083% IN NEBU
INHALATION_SOLUTION | RESPIRATORY_TRACT | Status: AC
  Filled 2018-12-11: qty 3

## 2018-12-11 MED ORDER — MIDAZOLAM HCL 2 MG/2ML IJ SOLN
INTRAMUSCULAR | Status: AC
  Filled 2018-12-11: qty 2

## 2018-12-11 MED ORDER — PHENYLEPHRINE 40 MCG/ML (10ML) SYRINGE FOR IV PUSH (FOR BLOOD PRESSURE SUPPORT)
PREFILLED_SYRINGE | INTRAVENOUS | Status: DC | PRN
  Administered 2018-12-11 (×5): 80 ug via INTRAVENOUS
  Administered 2018-12-11: 120 ug via INTRAVENOUS
  Administered 2018-12-11 (×3): 80 ug via INTRAVENOUS
  Administered 2018-12-11: 120 ug via INTRAVENOUS
  Administered 2018-12-11: 80 ug via INTRAVENOUS

## 2018-12-11 MED ORDER — LACTATED RINGERS IV SOLN
INTRAVENOUS | Status: DC | PRN
  Administered 2018-12-11 (×2): via INTRAVENOUS

## 2018-12-11 MED ORDER — DEXMEDETOMIDINE HCL IN NACL 200 MCG/50ML IV SOLN
INTRAVENOUS | Status: DC | PRN
  Administered 2018-12-11: 1 ug/kg/h via INTRAVENOUS

## 2018-12-11 MED ORDER — FENTANYL CITRATE (PF) 100 MCG/2ML IJ SOLN
25.0000 ug | INTRAMUSCULAR | Status: DC | PRN
  Administered 2018-12-11: 25 ug via INTRAVENOUS

## 2018-12-11 MED ORDER — SUCCINYLCHOLINE CHLORIDE 200 MG/10ML IV SOSY
PREFILLED_SYRINGE | INTRAVENOUS | Status: DC | PRN
  Administered 2018-12-11: 160 mg via INTRAVENOUS

## 2018-12-11 MED ORDER — DEXMEDETOMIDINE HCL IN NACL 200 MCG/50ML IV SOLN: 0.2000 ug/kg/h | INTRAVENOUS | Status: DC

## 2018-12-11 MED ORDER — ONDANSETRON HCL 4 MG/2ML IJ SOLN
INTRAMUSCULAR | Status: DC | PRN
  Administered 2018-12-11: 4 mg via INTRAVENOUS

## 2018-12-11 MED ORDER — ONDANSETRON HCL 4 MG/2ML IJ SOLN
INTRAMUSCULAR | Status: AC
  Filled 2018-12-11: qty 2

## 2018-12-11 MED ORDER — ONDANSETRON HCL 4 MG/2ML IJ SOLN: 4.0000 mg | Freq: Once | INTRAMUSCULAR | Status: DC | PRN

## 2018-12-11 MED ORDER — FENTANYL CITRATE (PF) 250 MCG/5ML IJ SOLN
INTRAMUSCULAR | Status: AC
  Filled 2018-12-11: qty 5

## 2018-12-11 MED ORDER — DEXAMETHASONE SODIUM PHOSPHATE 10 MG/ML IJ SOLN
INTRAMUSCULAR | Status: AC
  Filled 2018-12-11: qty 1

## 2018-12-11 MED ORDER — LIDOCAINE 2% (20 MG/ML) 5 ML SYRINGE
INTRAMUSCULAR | Status: AC
  Filled 2018-12-11: qty 5

## 2018-12-11 MED ORDER — 0.9 % SODIUM CHLORIDE (POUR BTL) OPTIME
TOPICAL | Status: DC | PRN
  Administered 2018-12-11: 1000 mL

## 2018-12-11 MED ORDER — ROCURONIUM BROMIDE 50 MG/5ML IV SOSY
PREFILLED_SYRINGE | INTRAVENOUS | Status: AC
  Filled 2018-12-11: qty 5

## 2018-12-11 MED ORDER — FENTANYL CITRATE (PF) 250 MCG/5ML IJ SOLN
INTRAMUSCULAR | Status: DC | PRN
  Administered 2018-12-11: 50 ug via INTRAVENOUS

## 2018-12-11 MED ORDER — MIDAZOLAM HCL 5 MG/5ML IJ SOLN
INTRAMUSCULAR | Status: DC | PRN
  Administered 2018-12-11: 2 mg via INTRAVENOUS
  Administered 2018-12-11: 1 mg via INTRAVENOUS

## 2018-12-11 MED ORDER — SUGAMMADEX SODIUM 200 MG/2ML IV SOLN
INTRAVENOUS | Status: DC | PRN
  Administered 2018-12-11: 200 mg via INTRAVENOUS

## 2018-12-11 SURGICAL SUPPLY — 42 items
ADAPTER VALVE BIOPSY EBUS (MISCELLANEOUS) IMPLANT
ADPTR VALVE BIOPSY EBUS (MISCELLANEOUS)
CANISTER SUCT 3000ML PPV (MISCELLANEOUS) ×3 IMPLANT
CATH EMB 5FR 80CM (CATHETERS) ×2 IMPLANT
CATH EMB 6FR 80CM (CATHETERS) ×2 IMPLANT
CATH LOADER DEPLOYMENT HUD (CATHETERS) ×2 IMPLANT
CONT SPEC 4OZ CLIKSEAL STRL BL (MISCELLANEOUS) ×3 IMPLANT
COVER BACK TABLE 60X90IN (DRAPES) ×3 IMPLANT
COVER WAND RF STERILE (DRAPES) ×3 IMPLANT
FILTER STRAW FLUID ASPIR (MISCELLANEOUS) IMPLANT
FORCEPS BIOP RJ4 1.8 (CUTTING FORCEPS) IMPLANT
FORCEPS RADIAL JAW LRG 4 PULM (INSTRUMENTS) IMPLANT
GAUZE SPONGE 4X4 12PLY STRL (GAUZE/BANDAGES/DRESSINGS) ×3 IMPLANT
GAUZE XEROFORM 1X8 LF (GAUZE/BANDAGES/DRESSINGS) ×2 IMPLANT
GLOVE SURG SIGNA 7.5 PF LTX (GLOVE) ×3 IMPLANT
GOWN STRL REUS W/ TWL XL LVL3 (GOWN DISPOSABLE) ×1 IMPLANT
GOWN STRL REUS W/TWL XL LVL3 (GOWN DISPOSABLE) ×2
KIT CLEAN ENDO COMPLIANCE (KITS) ×3 IMPLANT
KIT TURNOVER KIT B (KITS) ×3 IMPLANT
MARKER SKIN DUAL TIP RULER LAB (MISCELLANEOUS) ×3 IMPLANT
NS IRRIG 1000ML POUR BTL (IV SOLUTION) ×3 IMPLANT
OIL SILICONE PENTAX (PARTS (SERVICE/REPAIRS)) IMPLANT
PAD ARMBOARD 7.5X6 YLW CONV (MISCELLANEOUS) ×6 IMPLANT
RADIAL JAW LRG 4 PULMONARY (INSTRUMENTS) ×2
STOPCOCK MORSE 400PSI 3WAY (MISCELLANEOUS) ×3 IMPLANT
SYR 10ML LL (SYRINGE) ×3 IMPLANT
SYR 20ML ECCENTRIC (SYRINGE) ×3 IMPLANT
SYSTEM SAHARA CHEST DRAIN ATS (WOUND CARE) ×2 IMPLANT
TOWEL GREEN STERILE (TOWEL DISPOSABLE) ×3 IMPLANT
TOWEL GREEN STERILE FF (TOWEL DISPOSABLE) ×3 IMPLANT
TOWEL NATURAL 4PK STERILE (DISPOSABLE) ×3 IMPLANT
TRAP SPECIMEN MUCOUS 40CC (MISCELLANEOUS) ×3 IMPLANT
TUBE CONNECTING 20'X1/4 (TUBING) ×1
TUBE CONNECTING 20X1/4 (TUBING) ×2 IMPLANT
UNDERPAD 30X30 (UNDERPADS AND DIAPERS) ×3 IMPLANT
VALVE BIOPSY  SINGLE USE (MISCELLANEOUS) ×2
VALVE BIOPSY SINGLE USE (MISCELLANEOUS) ×1 IMPLANT
VALVE IN CARTRIDGE 6MM HUD (Valve) ×2 IMPLANT
VALVE IN CARTRIDGE 7MM HUD (Valve) ×4 IMPLANT
VALVE IN CARTRIDGE 9MM HUD (Valve) ×2 IMPLANT
VALVE SUCTION BRONCHIO DISP (MISCELLANEOUS) ×3 IMPLANT
WATER STERILE IRR 1000ML POUR (IV SOLUTION) ×3 IMPLANT

## 2018-12-11 NOTE — Anesthesia Procedure Notes (Signed)
Procedure Name: Intubation Date/Time: 12/11/2018 2:03 PM Performed by: Jearld Pies, CRNA Pre-anesthesia Checklist: Patient identified, Emergency Drugs available, Suction available and Patient being monitored Patient Re-evaluated:Patient Re-evaluated prior to induction Oxygen Delivery Method: Circle System Utilized Preoxygenation: Pre-oxygenation with 100% oxygen Induction Type: IV induction and Rapid sequence Laryngoscope Size: Glidescope and 4 Grade View: Grade I Tube type: Oral Tube size: 8.5 mm Number of attempts: 1 Airway Equipment and Method: Stylet and Oral airway Placement Confirmation: ETT inserted through vocal cords under direct vision,  positive ETCO2 and breath sounds checked- equal and bilateral Secured at: 23 cm Tube secured with: Tape Dental Injury: Teeth and Oropharynx as per pre-operative assessment  Comments: glidescope utilized d/t subcutaneous emphysema

## 2018-12-11 NOTE — Progress Notes (Signed)
Pt's going to Room 2H14 and Pt's wife took his belongings to [facility]. Gave a report to [employer]. Wasted morphine 3.79ml stericycle, witness by [employer]. HS [employer]

## 2018-12-11 NOTE — Anesthesia Postprocedure Evaluation (Signed)
Anesthesia Post Note  Patient: Randall Sullivan  Procedure(s) Performed: VIDEO BRONCHOSCOPY WITH INSERTION OF INTERBRONCHIAL VALVE (IBV) (N/A )     Patient location during evaluation: PACU Anesthesia Type: General Level of consciousness: confused and sedated Pain management: pain level controlled Vital Signs Assessment: post-procedure vital signs reviewed and stable Respiratory status: spontaneous breathing, nonlabored ventilation, respiratory function stable and patient connected to nasal cannula oxygen Cardiovascular status: blood pressure returned to baseline and stable Postop Assessment: no apparent nausea or vomiting Anesthetic complications: no    Last Vitals:  Vitals:   12/11/18 1700 12/11/18 1715  BP: 110/79 108/78  Pulse: 85 86  Resp: 17 18  Temp:  36.4 C  SpO2: 100% 100%    Last Pain:  Vitals:   12/11/18 1700  TempSrc:   PainSc: Tyler Deis

## 2018-12-11 NOTE — Interval H&P Note (Signed)
History and Physical Interval Note:  12/11/2018 1:21 PM  Randall Sullivan  has presented today for surgery, with the diagnosis of Air Leak.  The various methods of treatment have been discussed with the patient and family. After consideration of risks, benefits and other options for treatment, the patient has consented to  Procedure(s): VIDEO BRONCHOSCOPY WITH INSERTION OF INTERBRONCHIAL VALVE (IBV) (N/A) as a surgical intervention.  The patient's history has been reviewed, patient examined, no change in status, stable for surgery.  I have reviewed the patient's chart and labs.  Questions were answered to the patient's satisfaction.     [name]

## 2018-12-11 NOTE — Anesthesia Preprocedure Evaluation (Addendum)
Anesthesia Evaluation  Patient identified by MRN, date of birth, ID band Patient awake    Reviewed: Allergy & Precautions, NPO status , Patient's Chart, lab work & pertinent test results  History of Anesthesia Complications (+) history of anesthetic complications  Airway Mallampati: IV  TM Distance: >3 FB Neck ROM: Full   Comment: Facial edema Dental  (+) Dental Advisory Given   Pulmonary COPD, former smoker,    Pulmonary exam normal breath sounds clear to auscultation       Cardiovascular hypertension, Pt. on home beta blockers and Pt. on medications Normal cardiovascular exam Rhythm:Regular Rate:Normal     Neuro/Psych negative neurological ROS     GI/Hepatic negative GI ROS, Neg liver ROS,   Endo/Other  negative endocrine ROS  Renal/GU negative Renal ROS     Musculoskeletal  (+) Arthritis ,   Abdominal   Peds  Hematology negative hematology ROS (+)   Anesthesia Other Findings Day of surgery medications reviewed with the patient.  Reproductive/Obstetrics                                                             Anesthesia Evaluation  Patient identified by MRN, date of birth, ID band Patient awake    Reviewed: Allergy & Precautions, NPO status , Patient's Chart, lab work & pertinent test results  Airway Mallampati: I  TM Distance: >3 FB Neck ROM: Full    Dental   Pulmonary COPD, former smoker,    Pulmonary exam normal        Cardiovascular hypertension, Pt. on medications Normal cardiovascular exam     Neuro/Psych    GI/Hepatic   Endo/Other    Renal/GU      Musculoskeletal   Abdominal   Peds  Hematology   Anesthesia Other Findings   Reproductive/Obstetrics                             Anesthesia Physical Anesthesia Plan  ASA: III  Anesthesia Plan: General   Post-op Pain Management:    Induction:  Intravenous  Airway Management Planned: Double Lumen EBT  Additional Equipment: Arterial line, CVP and Ultrasound Guidance Line Placement  Intra-op Plan:   Post-operative Plan: Extubation in OR  Informed Consent: I have reviewed the patients History and Physical, chart, labs and discussed the procedure including the risks, benefits and alternatives for the proposed anesthesia with the patient or authorized representative who has indicated his/her understanding and acceptance.     Plan Discussed with: CRNA and Surgeon  Anesthesia Plan Comments:         Anesthesia Quick Evaluation                                   Anesthesia Evaluation  Patient identified by MRN, date of birth, ID band Patient awake    Reviewed: Allergy & Precautions, H&P , NPO status , Patient's Chart, lab work & pertinent test results  Airway Mallampati: II   Neck ROM: full    Dental   Pulmonary COPD, former smoker,  LUL nodule   breath sounds clear to auscultation       Cardiovascular hypertension,  Rhythm:regular Rate:Normal     Neuro/Psych  GI/Hepatic   Endo/Other    Renal/GU      Musculoskeletal  (+) Arthritis ,   Abdominal   Peds  Hematology   Anesthesia Other Findings   Reproductive/Obstetrics                             Anesthesia Physical Anesthesia Plan  ASA: III  Anesthesia Plan: General   Post-op Pain Management:    Induction: Intravenous  Airway Management Planned: Oral ETT  Additional Equipment:   Intra-op Plan:   Post-operative Plan: Extubation in OR  Informed Consent: I have reviewed the patients History and Physical, chart, labs and discussed the procedure including the risks, benefits and alternatives for the proposed anesthesia with the patient or authorized representative who has indicated his/her understanding and acceptance.     Plan Discussed with: CRNA, Anesthesiologist and Surgeon  Anesthesia Plan  Comments:         Anesthesia Quick Evaluation  Anesthesia Physical  Anesthesia Plan  ASA: III  Anesthesia Plan: General   Post-op Pain Management:    Induction: Intravenous  PONV Risk Score and Plan: 3 and Ondansetron, Dexamethasone and Treatment may vary due to age or medical condition  Airway Management Planned: Oral ETT  Additional Equipment:   Intra-op Plan:   Post-operative Plan: Extubation in OR and Possible Post-op intubation/ventilation  Informed Consent: I have reviewed the patients History and Physical, chart, labs and discussed the procedure including the risks, benefits and alternatives for the proposed anesthesia with the patient or authorized representative who has indicated his/her understanding and acceptance.     Dental advisory given  Plan Discussed with: CRNA  Anesthesia Plan Comments:        Anesthesia Quick Evaluation

## 2018-12-11 NOTE — Transfer of Care (Signed)
Immediate Anesthesia Transfer of Care Note  Patient: Dereke Koy  Procedure(s) Performed: VIDEO BRONCHOSCOPY WITH INSERTION OF INTERBRONCHIAL VALVE (IBV) (N/A )  Patient Location: PACU  Anesthesia Type:General  Level of Consciousness: drowsy and responds to stimulation  Airway & Oxygen Therapy: Patient Spontanous Breathing and Patient connected to face mask oxygen  Post-op Assessment: Report given to RN and Post -op Vital signs reviewed and stable  Post vital signs: Reviewed and stable  Last Vitals:  Vitals Value Taken Time  BP 138/77 12/11/2018  3:32 PM  Temp    Pulse 99 12/11/2018  3:37 PM  Resp 26 12/11/2018  3:37 PM  SpO2 93 % 12/11/2018  3:37 PM  Vitals shown include unvalidated device data.  Last Pain:  Vitals:   12/11/18 1001  TempSrc: Oral  PainSc:       Patients Stated Pain Goal: 5 (27/61/47 0929)  Complications: No apparent anesthesia complications    Ola Spurr MD called to PACU noting patient disoriented, pulling at IVs, trying to get out of bed, Ola Spurr MD came to bedside, orders for precedex gtt, started per MD orders.

## 2018-12-11 NOTE — Progress Notes (Addendum)
      [address]       [place] [address]             [phone]      2 Days Post-Op Procedure(s) (LRB): VIDEO ASSISTED THORACOSCOPY (VATS)/WEDGE RESECTION (Right)   Subjective:  Patient feels terrible.  He notes he is very swollen.  He denies difficulty.  Objective: Vital signs in last 24 hours: Temp:  [97.8 F (36.6 C)-98.4 F (36.9 C)] 98.4 F (36.9 C) (03/13 0329) Pulse Rate:  [80-155] 93 (03/13 0329) Cardiac Rhythm: Normal sinus rhythm (03/13 0329) Resp:  [16-31] 17 (03/13 0329) BP: (122-142)/(55-112) 139/84 (03/13 0329) SpO2:  [91 %-97 %] 94 % (03/13 0609) FiO2 (%):  [96 %] 96 % (03/13 0213)  Intake/Output from previous day: 03/12 0701 - 03/13 0700 In: 630.3 [I.V.:610.3] Out: 1870 [Urine:1250; Chest Tube:620]  General appearance: alert, cooperative and no distress Heart: regular rate and rhythm Lungs: clear to auscultation bilaterally Abdomen: soft, non-tender; bowel sounds normal; no masses,  no organomegaly Extremities: extremities normal, atraumatic, no cyanosis or edema Wound: clean and dry  Lab Results: Recent Labs    12/10/18 0700 12/11/18 0317  WBC 18.9* 20.1*  HGB 11.8* 13.5  HCT 37.0* 43.0  PLT 267 332   BMET:  Recent Labs    12/10/18 0700 12/11/18 0317  NA 138 133*  K 4.0 3.8  CL 105 101  CO2 26 25  GLUCOSE 130* 98  BUN 10 12  CREATININE 0.86 1.01  CALCIUM 8.1* 8.1*    PT/INR: No results for input(s): LABPROT, INR in the last 72 hours. ABG    Component Value Date/Time   PHART 7.398 12/10/2018 0700   HCO3 27.6 12/10/2018 0700   O2SAT 96.6 12/10/2018 0700   CBG (last 3)  No results for input(s): GLUCAP in the last 72 hours.  Assessment/Plan: S/P Procedure(s) (LRB): VIDEO ASSISTED THORACOSCOPY (VATS)/WEDGE RESECTION (Right)  1. Chest tube- + air leak, extensive sub q air, minimal output- CT on 40 cm of suction 2. Pulm- severe bullous emphysema, extensive sub q air with extension into neck, face and  eyelids, no dysphagia, no pneumothorax appreciated 3. CV- NSR, continue Toprol XL 4. Lovenox for DVT prophylaxis 5. Dispo-patient with worsening sub q emphysema with extension into neck, face, eyelids, CT on suction at 40 will leave in place, repeat CXR in AM   LOS: 2 days    Erin Barrett 12/11/2018 Large air leak. On 40 cm suction SQ emphysema worse I think bronch for IBV placement is indicated, will see if we can do today  Remo Lipps C. Roxan Hockey, MD [facility] [phone]  I discussed bronch for IBV placement with Mr and Mrs Belinsky. They understand general nature of procedure, need for GA and no guarantee of completely eliminating air leak. I informed them of the indications, risks, benefits and alternatives. He accepts the risks and agrees to proceed  Remo Lipps C. Roxan Hockey, MD [facility] [phone]

## 2018-12-11 NOTE — Progress Notes (Signed)
Per Dr. Roxan Hockey, pt can stay on nonrebreather for subcutaneous air, unless pt has a decline in respiratory effort.   MD also changed the chest tube suction from -20 to -40 at bedside.   Orders placed for both of these changes.

## 2018-12-11 NOTE — Progress Notes (Signed)
Pt arrived from PACU.  Pt sweating and agitated.  Bladder feels full on palpation.  Bladder scan 347ml.  Foley catheter placed with 42ml of clear yellow urine.  Pt states complete relief.  No aggitation, no sweating noted.

## 2018-12-11 NOTE — Brief Op Note (Signed)
12/11/2018  3:38 PM  PATIENT:  Randall Sullivan  68 y.o. male  PRE-OPERATIVE DIAGNOSIS:  Air Leak  POST-OPERATIVE DIAGNOSIS:  Air Leak  PROCEDURE:  Procedure(s): VIDEO BRONCHOSCOPY WITH INSERTION OF INTERBRONCHIAL VALVE (IBV) (N/A)  SURGEON:  Surgeon(s) and Role:    * [name], MD - Primary  PHYSICIAN ASSISTANT:   ASSISTANTS: none   ANESTHESIA:   general  EBL:  1 mL   BLOOD ADMINISTERED:none  DRAINS: none   LOCAL MEDICATIONS USED:  NONE  SPECIMEN:  No Specimen  DISPOSITION OF SPECIMEN:  N/A  COUNTS:  NO endo  TOURNIQUET:  * No tourniquets in log *  DICTATION: .Other Dictation: Dictation Number -  PLAN OF CARE: Already inpatient  PATIENT DISPOSITION:  PACU - hemodynamically stable.   Delay start of Pharmacological VTE agent (>24hrs) due to surgical blood loss or risk of bleeding: no  Valves placed in all 3 segmental bronchi of RUL and superior segment right lower lobe

## 2018-12-11 NOTE — H&P (View-Only) (Signed)
      [address]       [place] [address]             [phone]      2 Days Post-Op Procedure(s) (LRB): VIDEO ASSISTED THORACOSCOPY (VATS)/WEDGE RESECTION (Right)   Subjective:  Patient feels terrible.  He notes he is very swollen.  He denies difficulty.  Objective: Vital signs in last 24 hours: Temp:  [97.8 F (36.6 C)-98.4 F (36.9 C)] 98.4 F (36.9 C) (03/13 0329) Pulse Rate:  [80-155] 93 (03/13 0329) Cardiac Rhythm: Normal sinus rhythm (03/13 0329) Resp:  [16-31] 17 (03/13 0329) BP: (122-142)/(55-112) 139/84 (03/13 0329) SpO2:  [91 %-97 %] 94 % (03/13 0609) FiO2 (%):  [96 %] 96 % (03/13 0213)  Intake/Output from previous day: 03/12 0701 - 03/13 0700 In: 630.3 [I.V.:610.3] Out: 1870 [Urine:1250; Chest Tube:620]  General appearance: alert, cooperative and no distress Heart: regular rate and rhythm Lungs: clear to auscultation bilaterally Abdomen: soft, non-tender; bowel sounds normal; no masses,  no organomegaly Extremities: extremities normal, atraumatic, no cyanosis or edema Wound: clean and dry  Lab Results: Recent Labs    12/10/18 0700 12/11/18 0317  WBC 18.9* 20.1*  HGB 11.8* 13.5  HCT 37.0* 43.0  PLT 267 332   BMET:  Recent Labs    12/10/18 0700 12/11/18 0317  NA 138 133*  K 4.0 3.8  CL 105 101  CO2 26 25  GLUCOSE 130* 98  BUN 10 12  CREATININE 0.86 1.01  CALCIUM 8.1* 8.1*    PT/INR: No results for input(s): LABPROT, INR in the last 72 hours. ABG    Component Value Date/Time   PHART 7.398 12/10/2018 0700   HCO3 27.6 12/10/2018 0700   O2SAT 96.6 12/10/2018 0700   CBG (last 3)  No results for input(s): GLUCAP in the last 72 hours.  Assessment/Plan: S/P Procedure(s) (LRB): VIDEO ASSISTED THORACOSCOPY (VATS)/WEDGE RESECTION (Right)  1. Chest tube- + air leak, extensive sub q air, minimal output- CT on 40 cm of suction 2. Pulm- severe bullous emphysema, extensive sub q air with extension into neck, face and  eyelids, no dysphagia, no pneumothorax appreciated 3. CV- NSR, continue Toprol XL 4. Lovenox for DVT prophylaxis 5. Dispo-patient with worsening sub q emphysema with extension into neck, face, eyelids, CT on suction at 40 will leave in place, repeat CXR in AM   LOS: 2 days    Erin Barrett 12/11/2018 Large air leak. On 40 cm suction SQ emphysema worse I think bronch for IBV placement is indicated, will see if we can do today  Remo Lipps C. Roxan Hockey, MD [facility] [phone]  I discussed bronch for IBV placement with Mr and Mrs Mashek. They understand general nature of procedure, need for GA and no guarantee of completely eliminating air leak. I informed them of the indications, risks, benefits and alternatives. He accepts the risks and agrees to proceed  Remo Lipps C. Roxan Hockey, MD [facility] [phone]

## 2018-12-11 NOTE — Progress Notes (Signed)
      [address]       [place] [address]             [phone]      S/p IBV placement  SQ persists but seems less "tense" BP 108/78 (BP Location: Left Leg)   Pulse 85   Temp 97.6 F (36.4 C)   Resp 19   Ht 5\' 10"  (1.778 m)   Wt 93.3 kg   SpO2 100%   BMI 29.51 kg/m   Intake/Output Summary (Last 24 hours) at 12/11/2018 1833 Last data filed at 12/11/2018 1655 Gross per 24 hour  Intake 1276.12 ml  Output 1099 ml  Net 177.12 ml   Air leak significantly improved but not completely resolved  Remo Lipps C. Roxan Hockey, MD [facility] [phone]

## 2018-12-12 ENCOUNTER — Inpatient Hospital Stay (HOSPITAL_COMMUNITY): Payer: Medicare Other

## 2018-12-12 LAB — CBC
HCT: 39.4 % (ref 39.0–52.0)
HEMOGLOBIN: 12 g/dL — AB (ref 13.0–17.0)
MCH: 26.2 pg (ref 26.0–34.0)
MCHC: 30.5 g/dL (ref 30.0–36.0)
MCV: 86 fL (ref 80.0–100.0)
Platelets: 281 10*3/uL (ref 150–400)
RBC: 4.58 MIL/uL (ref 4.22–5.81)
RDW: 14.5 % (ref 11.5–15.5)
WBC: 20.2 10*3/uL — ABNORMAL HIGH (ref 4.0–10.5)
nRBC: 0 % (ref 0.0–0.2)

## 2018-12-12 LAB — BASIC METABOLIC PANEL
Anion gap: 9 (ref 5–15)
BUN: 13 mg/dL (ref 8–23)
CHLORIDE: 100 mmol/L (ref 98–111)
CO2: 27 mmol/L (ref 22–32)
CREATININE: 0.98 mg/dL (ref 0.61–1.24)
Calcium: 8.3 mg/dL — ABNORMAL LOW (ref 8.9–10.3)
GFR calc Af Amer: >60 mL/min (ref >60)
GFR calc non Af Amer: >60 mL/min (ref >60)
Glucose, Bld: 129 mg/dL — ABNORMAL HIGH (ref 70–99)
Potassium: 4.4 mmol/L (ref 3.5–5.1)
Sodium: 136 mmol/L (ref 135–145)

## 2018-12-12 LAB — GLUCOSE, CAPILLARY
Glucose-Capillary: 113 mg/dL — ABNORMAL HIGH (ref 70–99)
Glucose-Capillary: 99 mg/dL (ref 70–99)

## 2018-12-12 MED ORDER — CHLORHEXIDINE GLUCONATE CLOTH 2 % EX PADS
6.0000 | MEDICATED_PAD | Freq: Every day | CUTANEOUS | Status: DC
  Administered 2018-12-12 – 2018-12-17 (×6): 6 via TOPICAL

## 2018-12-12 MED ORDER — ALBUTEROL SULFATE (2.5 MG/3ML) 0.083% IN NEBU
2.5000 mg | INHALATION_SOLUTION | Freq: Three times a day (TID) | RESPIRATORY_TRACT | Status: DC
  Administered 2018-12-12 – 2018-12-13 (×4): 2.5 mg via RESPIRATORY_TRACT
  Filled 2018-12-12 (×4): qty 3

## 2018-12-12 NOTE — Op Note (Signed)
NAME: Randall Sullivan, Randall Sullivan MEDICAL RECORD [id number] ACCOUNT [id number] DATE OF BIRTH:29-Oct-1950 FACILITY: [facility] LOCATION: [facility] PHYSICIAN:Alucard Fearnow Chaya Jan, MD  OPERATIVE REPORT  DATE OF PROCEDURE:  12/11/2018  PREOPERATIVE DIAGNOSIS:  Severe air leak after wedge resection.  POSTOPERATIVE DIAGNOSIS:  Severe air leak after wedge resection.  PROCEDURE:  Bronchoscopy with endobronchial valve placement in anterior, apical, and posterior segments of right upper lobe and superior segment of right lower lobe.  SURGEON:  Modesto Charon, MD  ASSISTANT:  None.  ANESTHESIA:  General.  FINDINGS:  Marked decrease an air leak, but not completely resolved after valve placement.  CLINICAL NOTE:  Randall Sullivan is a 68 year old gentleman with a history of tobacco abuse and COPD.  He recently underwent a wedge resection and blebectomy from the right upper lobe.  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  Postoperatively,  he developed a severe air leak, and on the morning of the procedure, he had severe subcutaneous emphysema with involvement of the face and eyelids to the extent he could not open his eyes.  He was advised to undergo intrabronchial valve placement for  management of the air leak but was informed that there was no guarantee it would completely resolve.  The indications, risks, benefits, and alternatives were discussed in detail with the patient.  He understood and accepted the risks and agreed to  proceed.  OPERATIVE NOTE:  The patient was brought to the operating room on 12/11/2018.  He had induction of general anesthesia and was intubated.  A timeout was performed.  Flexible fiberoptic bronchoscopy was performed via the endotracheal tube.  On the left  side, the left upper lobe bronchus was healed.  The lower lobe bronchus was without any abnormality.  On the right side, there was normal endobronchial anatomy with no endobronchial lesions.  There were thick secretions  bilaterally.  These were irrigated  with saline and cleared.  A #6 Fogarty catheter was used to attempt to identify a specific site for the leak.  Occlusion of the right upper lobe bronchus decreased but did not resolve the leak.  Initially, it appeared that the inflation of the balloon  in the bronchus intermedius resolved the leak, but that could not be duplicated with repeated inflations in that area.  The superior segment, the right lower lobe as a whole, and the middle lobe were all separately occluded with the balloon.   Interestingly, occlusion in any of the areas seemed to decrease the leak, but the leak did not resolve completely.  The most impressive decrease in the leak was with occlusion of the right upper lobe bronchus.  The anterior apical and posterior segmental  bronchi were sized for valves separately and valves were deployed.  7 mm valves were placed in both the apical and anterior segmental bronchi and a 6 mm in the posterior.  The balloon inflation process then was repeated in the lower and middle lobes.   The air leak was decreased after placement of the right upper lobe valves.  Inflation of the balloon in the right lower lobe superior segmental bronchus seemed to decrease the leak even more.  Occlusion of the basilar and middle lobe bronchi did not seem  to have any effect.  A 9 mm valve was deployed in the superior segmental bronchus.  The bronchoscope was removed.  The patient then was extubated in the operating room and taken to the postanesthetic care unit in fair condition.  LN/NUANCE  D:12/11/2018 T:12/12/2018 JOB:[id number]

## 2018-12-12 NOTE — Progress Notes (Signed)
1 Day Post-Op Procedure(s) (LRB): VIDEO BRONCHOSCOPY WITH INSERTION OF INTERBRONCHIAL VALVE (IBV) (N/A) Subjective: Feels better today  Objective: Vital signs in last 24 hours: Temp:  [97.5 F (36.4 C)-98.8 F (37.1 C)] 97.9 F (36.6 C) (03/14 0400) Pulse Rate:  [64-106] 75 (03/14 0500) Cardiac Rhythm: Normal sinus rhythm (03/14 0400) Resp:  [9-26] 17 (03/14 0850) BP: (99-139)/(69-105) 129/102 (03/14 0500) SpO2:  [90 %-100 %] 99 % (03/14 0850)  Hemodynamic parameters for last 24 hours:    Intake/Output from previous day: 03/13 0701 - 03/14 0700 In: 1513.7 [P.O.:220; I.V.:1093.7] Out: 1231 [Urine:1100; Blood:1; Chest Tube:130] Intake/Output this shift: No intake/output data recorded.  General appearance: alert, cooperative and no distress Neurologic: intact Heart: regular rate and rhythm Lungs: clear to auscultation bilaterally SQ emphysema improved, air leak present decreased  Lab Results: Recent Labs    12/11/18 0317 12/12/18 0417  WBC 20.1* 20.2*  HGB 13.5 12.0*  HCT 43.0 39.4  PLT 332 281   BMET:  Recent Labs    12/11/18 0317 12/12/18 0417  NA 133* 136  K 3.8 4.4  CL 101 100  CO2 25 27  GLUCOSE 98 129*  BUN 12 13  CREATININE 1.01 0.98  CALCIUM 8.1* 8.3*    PT/INR: No results for input(s): LABPROT, INR in the last 72 hours. ABG    Component Value Date/Time   PHART 7.398 12/10/2018 0700   HCO3 27.6 12/10/2018 0700   O2SAT 96.6 12/10/2018 0700   CBG (last 3)  Recent Labs    12/11/18 2314 12/12/18 0342 12/12/18 0820  GLUCAP 135* 113* 99    Assessment/Plan: S/P Procedure(s) (LRB): VIDEO BRONCHOSCOPY WITH INSERTION OF INTERBRONCHIAL VALVE (IBV) (N/A) -Looks better this AM SQ emphysema extensive but improved Still has an air leak, but improved post IBV placement Will keep  CT to suction today Mobilize Leukocytosis- monitor Transfer to [place]   LOS: 3 days    [name] 12/12/2018

## 2018-12-12 NOTE — Plan of Care (Signed)
  Problem: Safety: Goal: Ability to remain free from injury will improve Outcome: Progressing   Problem: Education: Goal: Knowledge of disease or condition will improve Outcome: Progressing Goal: Knowledge of the prescribed therapeutic regimen will improve Outcome: Progressing   Problem: Activity: Goal: Risk for activity intolerance will decrease Outcome: Progressing   Problem: Cardiac: Goal: Will achieve and/or maintain hemodynamic stability Outcome: Progressing   Problem: Clinical Measurements: Goal: Postoperative complications will be avoided or minimized Outcome: Progressing   Problem: Respiratory: Goal: Respiratory status will improve Outcome: Progressing   Problem: Pain Management: Goal: Pain level will decrease Outcome: Progressing   Problem: Skin Integrity: Goal: Wound healing without signs and symptoms infection will improve Outcome: Progressing

## 2018-12-13 ENCOUNTER — Inpatient Hospital Stay (HOSPITAL_COMMUNITY): Payer: Medicare Other

## 2018-12-13 LAB — BASIC METABOLIC PANEL
ANION GAP: 12 (ref 5–15)
BUN: 15 mg/dL (ref 8–23)
CO2: 26 mmol/L (ref 22–32)
Calcium: 8.6 mg/dL — ABNORMAL LOW (ref 8.9–10.3)
Chloride: 99 mmol/L (ref 98–111)
Creatinine, Ser: 0.98 mg/dL (ref 0.61–1.24)
GFR calc Af Amer: >60 mL/min (ref >60)
GFR calc non Af Amer: >60 mL/min (ref >60)
Glucose, Bld: 88 mg/dL (ref 70–99)
Potassium: 4.2 mmol/L (ref 3.5–5.1)
Sodium: 137 mmol/L (ref 135–145)

## 2018-12-13 LAB — CBC
HCT: 40.2 % (ref 39.0–52.0)
Hemoglobin: 12.4 g/dL — ABNORMAL LOW (ref 13.0–17.0)
MCH: 26.9 pg (ref 26.0–34.0)
MCHC: 30.8 g/dL (ref 30.0–36.0)
MCV: 87.2 fL (ref 80.0–100.0)
Platelets: 349 10*3/uL (ref 150–400)
RBC: 4.61 MIL/uL (ref 4.22–5.81)
RDW: 14.6 % (ref 11.5–15.5)
WBC: 20.7 10*3/uL — ABNORMAL HIGH (ref 4.0–10.5)
nRBC: 0 % (ref 0.0–0.2)

## 2018-12-13 MED ORDER — DILTIAZEM HCL-DEXTROSE 100-5 MG/100ML-% IV SOLN (PREMIX)
5.0000 mg/h | INTRAVENOUS | Status: DC
  Administered 2018-12-13: 5 mg/h via INTRAVENOUS
  Administered 2018-12-14: 10 mg/h via INTRAVENOUS
  Filled 2018-12-13 (×2): qty 100

## 2018-12-13 MED ORDER — DILTIAZEM LOAD VIA INFUSION
20.0000 mg | Freq: Once | INTRAVENOUS | Status: AC
  Administered 2018-12-13: 20 mg via INTRAVENOUS
  Filled 2018-12-13: qty 20

## 2018-12-13 MED ORDER — METOPROLOL TARTRATE 5 MG/5ML IV SOLN
INTRAVENOUS | Status: AC
  Administered 2018-12-13: 5 mg
  Filled 2018-12-13: qty 5

## 2018-12-13 MED ORDER — LIDOCAINE HCL (PF) 1 % IJ SOLN
INTRAMUSCULAR | Status: AC
  Administered 2018-12-13: 5 mL
  Filled 2018-12-13: qty 5

## 2018-12-13 MED ORDER — HYDRALAZINE HCL 20 MG/ML IJ SOLN
10.0000 mg | Freq: Four times a day (QID) | INTRAMUSCULAR | Status: DC | PRN
  Administered 2018-12-13: 10 mg via INTRAVENOUS
  Filled 2018-12-13: qty 1

## 2018-12-13 MED ORDER — ALBUTEROL SULFATE (2.5 MG/3ML) 0.083% IN NEBU
2.5000 mg | INHALATION_SOLUTION | RESPIRATORY_TRACT | Status: DC
  Administered 2018-12-14 – 2018-12-15 (×13): 2.5 mg via RESPIRATORY_TRACT
  Filled 2018-12-13 (×13): qty 3

## 2018-12-13 MED ORDER — METOPROLOL TARTRATE 5 MG/5ML IV SOLN
INTRAVENOUS | Status: AC
  Administered 2018-12-13: 2.5 mg
  Filled 2018-12-13: qty 5

## 2018-12-13 NOTE — Progress Notes (Addendum)
R CT placed by Dr. Roxan Hockey. Time out completed with MD, Mindy, RN, and myself at 90. Patient went into Afib with RVR post procedure with rates up to the 180s. B/P stable. MD remains at bedside and orders Lopressor and Cardizem gtt. Please see MAR. Wife at bedside and updated. Will continue to monitor.

## 2018-12-13 NOTE — Progress Notes (Signed)
Patient out of bed to chair. No complaints of SOB, dizziness, and/or headache. Pain management maintained. Continues on PCA pump Morphine. Chest tube to right lateral chest wall remains intact. Surgical site clean,dry, and intact as well. Swelling noted to the left eye but has not increase in size since early this morning. Crepitus to bilateral upper extremities and chest. O2 SAT 94-96% on 2 liters nasal canula. Call bell within encouraged to report signs and symptoms to the healthcare team.

## 2018-12-13 NOTE — Progress Notes (Signed)
      [address]       [place] [address]             [phone]      CTSP for increased Sq emphysema  He is in no distress Increased SQ air in right side of face- was more predominately in left side of face earlier today He is hypertensive, but in no distress. SQ air was more extensive prior to valve placement  than it is now. Air leak is still present. CT is in position and is working. I suspect the tube became kinked or there was temporarily an occlusion (there is some fibrinous material in tubing. This was "milked."  Continue CT to 40 cm of suction PRN hydralazine for BP  Remo Lipps C. Roxan Hockey, MD [facility] [phone]

## 2018-12-13 NOTE — Progress Notes (Signed)
Assisted bedside RN and Dr. Roxan Hockey with R ANT CT insertion.  Pt on monitor and BP checked q35min t/o procedure.  Pt tolerated well.  Post procedure pt HR-180s afib with RVR.  2.5mg  lopressor given by bedside RN.  Lopressor helped initially but Afib with RVR reoccured.  Cardizem gtt ordered. Bedside RN to call RRT if further assistance needed.

## 2018-12-13 NOTE — Progress Notes (Signed)
MD at the bedside. See his notes

## 2018-12-13 NOTE — Progress Notes (Signed)
The breathing got better. Wheezing still present. Hydralazine given PRN. Emotional support was given to the pt and family.  Bed in low position, alarms are on, call bell in reach, wife at the bedside, continue to monitor closely

## 2018-12-13 NOTE — Progress Notes (Addendum)
      [address]       [place] [address]             [phone]       2 Days Post-Op Procedure(s) (LRB): VIDEO BRONCHOSCOPY WITH INSERTION OF INTERBRONCHIAL VALVE (IBV) (N/A)  Subjective: Patient states eyes more swollen this am.  Objective: Vital signs in last 24 hours: Temp:  [97.7 F (36.5 C)-99.5 F (37.5 C)] 98.4 F (36.9 C) (03/15 0811) Pulse Rate:  [77-106] 106 (03/15 0811) Cardiac Rhythm: Normal sinus rhythm (03/14 1954) Resp:  [16-28] 22 (03/15 0811) BP: (130-159)/(77-91) 134/82 (03/15 0811) SpO2:  [92 %-99 %] 96 % (03/15 0811) FiO2 (%):  [32 %] 32 % (03/14 2009)     Intake/Output from previous day: 03/14 0701 - 03/15 0700 In: -  Out: 825 [Urine:785; Chest Tube:40]   Physical Exam:  Cardiovascular: RRR Pulmonary: Coarse bilaterally. Subcutaneous emphysema bilateral chest walls, neck face, eyes Abdomen: Soft, non tender, bowel sounds present. Extremities: Trace bilateral lower extremity edema. Wounds: Clean and dry.  No erythema or signs of infection. Chest Tube: to 40 cm suction, +++ air leak  Lab Results: CBC: Recent Labs    12/12/18 0417 12/13/18 0246  WBC 20.2* 20.7*  HGB 12.0* 12.4*  HCT 39.4 40.2  PLT 281 349   BMET:  Recent Labs    12/12/18 0417 12/13/18 0246  NA 136 137  K 4.4 4.2  CL 100 99  CO2 27 26  GLUCOSE 129* 88  BUN 13 15  CREATININE 0.98 0.98  CALCIUM 8.3* 8.6*    PT/INR: No results for input(s): LABPROT, INR in the last 72 hours. ABG:  INR: Will add last result for INR, ABG once components are confirmed Will add last 4 CBG results once components are confirmed  Assessment/Plan:  1. CV - SR with HR in the 90's. On Toprol XL 50 mg daily 2.  Pulmonary - S/p bronch with IBV RUL and superior segment RUL 03/13. Chest tube with 40 cc output last 24 hours. CT is to and there is a +++ air leak (,may be worse than previously). CXR this am shows no pneumothorax and extensive bilateral subcutaneous  emphysema. Check CXR in am. Encourage incentive spirometer. May need more IBVs. Dr. Roxan Hockey to determine in am. 3. Leukocytosis-WbC 20,700. Remains afebrile and no sign of wound infection. Re check in am.  Sharalyn Ink [facility] 12/13/2018,8:47 AM [phone]  Patient seen and examined, SQ air increased again this morning, tube may have kinked at some point overnight Air leak appears slightly bigger this AM, but still less than it was originally Will follow- may have to consider placing additional valves  Remo Lipps C. Roxan Hockey, MD [facility] [phone]

## 2018-12-13 NOTE — Progress Notes (Signed)
MD at the bedside to do the procedure at the bedside. Consent signed by wife at the bedside. Report given to upcoming nurse.

## 2018-12-13 NOTE — Procedures (Signed)
Informed consent obtained. Time out performed 14 F pigtail placed right anterior chest. Sterile technique, 10 ml 1% lidocaine local  + air leak Tolerated well  Remo Lipps C. Roxan Hockey, MD [facility] [phone]

## 2018-12-13 NOTE — Progress Notes (Signed)
RT called per pt request of SOB

## 2018-12-13 NOTE — Progress Notes (Signed)
Pt seems SOB, using accessory muscles, siting on the edge of the bed. SBP in 180s, HR in 110s. Chest tube suction  is on 40. Air leak present. Dr. Roxan Hockey called and notified about the findings. He states "Do not to panic, I will come and see him"  RT called as well and notified

## 2018-12-13 NOTE — Progress Notes (Signed)
      [address]       [place] [address]             [phone]      Went into atrial fib with RVR at completion of CT insertion  BP in 297L systolic  Lopressor 2.5 mg given IV x 2 Will start diltiazem bolus and drip  Remo Lipps C. Roxan Hockey, MD [facility] [phone]

## 2018-12-13 NOTE — Progress Notes (Signed)
      [address]       [place] [address]             [phone]      Reassessed. He still has extensive SQ air extending into face. He is hemodynamically stable and not in distress other than being unable to see.  Ct with variable amount of air leak. Large leak but at times drops off. I suspect it is intermittently getting partially occluded with fibrin. I think we should place a new tube anteriorly tonight. I discussed risks of bleeding and lung injury with him. He agrees to proceed.  Given size of air leak which now looks about like it did before valve placement, I think we are going to need to return to OR for redo VATS. Will plan to take back to OR tomorrow. I informed him of the indications, risks, benefits and alternatives.  Revonda Standard Roxan Hockey, MD [facility] [phone]

## 2018-12-14 ENCOUNTER — Encounter (HOSPITAL_COMMUNITY): Payer: Self-pay | Admitting: Registered Nurse

## 2018-12-14 ENCOUNTER — Inpatient Hospital Stay (HOSPITAL_COMMUNITY): Payer: Medicare Other

## 2018-12-14 ENCOUNTER — Encounter (HOSPITAL_COMMUNITY)
Admission: RE | Disposition: A | Discharge status: Home Health | Payer: Self-pay | Source: Home / Self Care | Attending: Thoracic Surgery (Cardiothoracic Vascular Surgery)

## 2018-12-14 ENCOUNTER — Encounter (HOSPITAL_COMMUNITY): Payer: Self-pay | Admitting: Thoracic Surgery (Cardiothoracic Vascular Surgery)

## 2018-12-14 LAB — CBC
HCT: 38.8 % — ABNORMAL LOW (ref 39.0–52.0)
HEMOGLOBIN: 12.3 g/dL — AB (ref 13.0–17.0)
MCH: 27.1 pg (ref 26.0–34.0)
MCHC: 31.7 g/dL (ref 30.0–36.0)
MCV: 85.5 fL (ref 80.0–100.0)
Platelets: 299 10*3/uL (ref 150–400)
RBC: 4.54 MIL/uL (ref 4.22–5.81)
RDW: 14.6 % (ref 11.5–15.5)
WBC: 14.2 10*3/uL — ABNORMAL HIGH (ref 4.0–10.5)
nRBC: 0 % (ref 0.0–0.2)

## 2018-12-14 LAB — INFLUENZA PANEL BY PCR (TYPE A & B)
Influenza A By PCR: POSITIVE — AB
Influenza B By PCR: NEGATIVE

## 2018-12-14 SURGERY — VIDEO ASSISTED THORACOSCOPY (VATS)/THOROCOTOMY
Anesthesia: General | Site: Chest | Laterality: Right

## 2018-12-14 MED ORDER — DILTIAZEM HCL ER 60 MG PO CP12
120.0000 mg | ORAL_CAPSULE | Freq: Two times a day (BID) | ORAL | Status: DC
  Administered 2018-12-14 – 2018-12-19 (×10): 120 mg via ORAL
  Filled 2018-12-14 (×12): qty 2

## 2018-12-14 MED ORDER — PIPERACILLIN-TAZOBACTAM 3.375 G IVPB
3.3750 g | Freq: Three times a day (TID) | INTRAVENOUS | Status: AC
  Administered 2018-12-14 – 2018-12-21 (×21): 3.375 g via INTRAVENOUS
  Filled 2018-12-14 (×21): qty 50

## 2018-12-14 MED ORDER — PIPERACILLIN-TAZOBACTAM 3.375 G IVPB
3.3750 g | Freq: Three times a day (TID) | INTRAVENOUS | Status: DC
  Filled 2018-12-14: qty 50

## 2018-12-14 MED ORDER — VANCOMYCIN HCL 10 G IV SOLR
1250.0000 mg | Freq: Two times a day (BID) | INTRAVENOUS | Status: DC
  Administered 2018-12-14 – 2018-12-16 (×5): 1250 mg via INTRAVENOUS
  Filled 2018-12-14 (×5): qty 1250

## 2018-12-14 MED ORDER — VANCOMYCIN HCL 1000 MG IV SOLR
1000.0000 mg | INTRAVENOUS | Status: DC
  Filled 2018-12-14: qty 1000

## 2018-12-14 MED ORDER — OSELTAMIVIR PHOSPHATE 75 MG PO CAPS
75.0000 mg | ORAL_CAPSULE | Freq: Two times a day (BID) | ORAL | Status: AC
  Administered 2018-12-14 – 2018-12-19 (×10): 75 mg via ORAL
  Filled 2018-12-14 (×10): qty 1

## 2018-12-14 NOTE — Progress Notes (Signed)
Pharmacy Antibiotic Note  Randall Sullivan is a 68 y.o. male admitted on 12/09/2018 with pneumonia.  Pharmacy has been consulted for vancomycin and Zosyn dosing. Pt noted to be febrile with WBC elevated, redo VATS needed at some point this week.  Plan: -Zosyn 3.375g IV EI q8h -Vancomycin 1250mg  IV q12h -Monitor LOT, renal funx, cultures -Vancomycin levels as indicated  Height: 5\' 10"  (177.8 cm) Weight: 205 lb 11 oz (93.3 kg) IBW/kg (Calculated) : 73  Temp (24hrs), Avg:100.2 F (37.9 C), Min:98.4 F (36.9 C), Max:103.7 F (39.8 C)  Recent Labs  Lab 12/07/18 1550 12/10/18 0700 12/11/18 0317 12/12/18 0417 12/13/18 0246 12/14/18 0330  WBC 10.4 18.9* 20.1* 20.2* 20.7* 14.2*  CREATININE 0.98 0.86 1.01 0.98 0.98  --     Estimated Creatinine Clearance: 82.8 mL/min (by C-G formula based on SCr of 0.98 mg/dL).    Allergies  Allergen Reactions  . No Known Allergies     Antimicrobials this admission: Vancomycin 3/16 >>  Zosyn 3/16 >>   Dose adjustments this admission: none  Microbiology results: 3/9 MRSA PCR: negative  Thank you for allowing pharmacy to be a part of this patient's care.  Arrie Senate, PharmD, BCPS Clinical Pharmacist [phone] Please check AMION for all [place] numbers 12/14/2018

## 2018-12-14 NOTE — Plan of Care (Signed)
  Problem: Education: Goal: Knowledge of General Education information will improve Description Including pain rating scale, medication(s)/side effects and non-pharmacologic comfort measures Outcome: Progressing   Problem: Health Behavior/Discharge Planning: Goal: Ability to manage health-related needs will improve Outcome: Progressing   Problem: Clinical Measurements: Goal: Ability to maintain clinical measurements within normal limits will improve Outcome: Progressing Goal: Will remain free from infection Outcome: Progressing Goal: Diagnostic test results will improve Outcome: Progressing Goal: Respiratory complications will improve Outcome: Progressing Goal: Cardiovascular complication will be avoided Outcome: Progressing   Problem: Activity: Goal: Risk for activity intolerance will decrease Outcome: Progressing   Problem: Nutrition: Goal: Adequate nutrition will be maintained Outcome: Progressing   Problem: Coping: Goal: Level of anxiety will decrease Outcome: Progressing   Problem: Pain Managment: Goal: General experience of comfort will improve Outcome: Progressing   Problem: Safety: Goal: Ability to remain free from injury will improve Outcome: Progressing   Problem: Skin Integrity: Goal: Risk for impaired skin integrity will decrease Outcome: Progressing   Problem: Education: Goal: Knowledge of disease or condition will improve Outcome: Progressing Goal: Knowledge of the prescribed therapeutic regimen will improve Outcome: Progressing   Problem: Activity: Goal: Risk for activity intolerance will decrease Outcome: Progressing   Problem: Cardiac: Goal: Will achieve and/or maintain hemodynamic stability Outcome: Progressing   Problem: Clinical Measurements: Goal: Postoperative complications will be avoided or minimized Outcome: Progressing   Problem: Respiratory: Goal: Respiratory status will improve Outcome: Progressing   Problem: Pain  Management: Goal: Pain level will decrease Outcome: Progressing   Problem: Skin Integrity: Goal: Wound healing without signs and symptoms infection will improve Outcome: Progressing

## 2018-12-14 NOTE — Progress Notes (Signed)
      [address]       [place] [address]             [phone]      [name] spiked a fever to 103 F this morning He is in no distress, only complaint is being thirsty In SR in 80s, BP OK sats are Ok Likely a HAP - will start vanco and zosyn empirically I do not think it is safe to proceed with VATS today Will reschedule later this week  Remo Lipps C. Roxan Hockey, MD [facility] [phone]

## 2018-12-14 NOTE — Progress Notes (Signed)
3 Days Post-Op Procedure(s) (LRB): VIDEO BRONCHOSCOPY WITH INSERTION OF INTERBRONCHIAL VALVE (IBV) (N/A) Subjective: Feels a better this AM  Objective: Vital signs in last 24 hours: Temp:  [98.4 F (36.9 C)-102.9 F (39.4 C)] 98.4 F (36.9 C) (03/16 0400) Pulse Rate:  [85-144] 100 (03/16 0600) Cardiac Rhythm: Sinus tachycardia (03/16 0700) Resp:  [18-31] 23 (03/16 0600) BP: (113-184)/(60-99) 147/76 (03/16 0600) SpO2:  [92 %-98 %] 94 % (03/16 0600)  Hemodynamic parameters for last 24 hours:    Intake/Output from previous day: 03/15 0701 - 03/16 0700 In: 926.6 [P.O.:840; I.V.:86.6] Out: 1067 [Urine:625; Chest Tube:442] Intake/Output this shift: No intake/output data recorded.  General appearance: alert, cooperative, no distress and extensive SQ emphysema, slightly better Neurologic: intact Heart: regular rate and rhythm Lungs: wheezes bilaterally large air leak  Lab Results: Recent Labs    12/13/18 0246 12/14/18 0330  WBC 20.7* 14.2*  HGB 12.4* 12.3*  HCT 40.2 38.8*  PLT 349 299   BMET:  Recent Labs    12/12/18 0417 12/13/18 0246  NA 136 137  K 4.4 4.2  CL 100 99  CO2 27 26  GLUCOSE 129* 88  BUN 13 15  CREATININE 0.98 0.98  CALCIUM 8.3* 8.6*    PT/INR: No results for input(s): LABPROT, INR in the last 72 hours. ABG    Component Value Date/Time   PHART 7.398 12/10/2018 0700   HCO3 27.6 12/10/2018 0700   O2SAT 96.6 12/10/2018 0700   CBG (last 3)  Recent Labs    12/11/18 2314 12/12/18 0342 12/12/18 0820  GLUCAP 135* 113* 99    Assessment/Plan: S/P Procedure(s) (LRB): VIDEO BRONCHOSCOPY WITH INSERTION OF INTERBRONCHIAL VALVE (IBV) (N/A) - Large air leak persists, SQ emphysema slightly decreased since 2nd CT placed last night I do not think there is any good option at this point other than to return to OR for redo right VATS to try to address the leak surgically. He is aware of the indications, risks, benefits and alternatives. He agrees to  proceed.   LOS: 5 days    [name] 12/14/2018

## 2018-12-14 NOTE — Progress Notes (Signed)
PCA pump discontinued via provider orders. Wasted  10 mL of morphine with second nurse, Carla Nugents. Wasted medication in Steri Cycle.

## 2018-12-15 ENCOUNTER — Inpatient Hospital Stay (HOSPITAL_COMMUNITY): Payer: Medicare Other

## 2018-12-15 DIAGNOSIS — J1100 Influenza due to unidentified influenza virus with unspecified type of pneumonia: Secondary | ICD-10-CM

## 2018-12-15 DIAGNOSIS — J449 Chronic obstructive pulmonary disease, unspecified: Secondary | ICD-10-CM

## 2018-12-15 DIAGNOSIS — J9382 Other air leak: Secondary | ICD-10-CM

## 2018-12-15 DIAGNOSIS — J80 Acute respiratory distress syndrome: Secondary | ICD-10-CM

## 2018-12-15 LAB — BLOOD GAS, ARTERIAL
Acid-Base Excess: 6.1 mmol/L — ABNORMAL HIGH (ref 0.0–2.0)
Bicarbonate: 30 mmol/L — ABNORMAL HIGH (ref 20.0–28.0)
FIO2: 100
O2 Saturation: 95.5 %
PATIENT TEMPERATURE: 98.6
pCO2 arterial: 42.5 mmHg (ref 32.0–48.0)
pH, Arterial: 7.462 — ABNORMAL HIGH (ref 7.350–7.450)
pO2, Arterial: 72.3 mmHg — ABNORMAL LOW (ref 83.0–108.0)

## 2018-12-15 MED ORDER — ALBUTEROL SULFATE (2.5 MG/3ML) 0.083% IN NEBU: 2.5000 mg | INHALATION_SOLUTION | Freq: Three times a day (TID) | RESPIRATORY_TRACT | Status: DC

## 2018-12-15 MED ORDER — CHLORHEXIDINE GLUCONATE 0.12 % MT SOLN
15.0000 mL | Freq: Two times a day (BID) | OROMUCOSAL | Status: DC
  Administered 2018-12-15 – 2019-01-06 (×39): 15 mL via OROMUCOSAL
  Filled 2018-12-15 (×39): qty 15

## 2018-12-15 MED ORDER — METHYLPREDNISOLONE SODIUM SUCC 125 MG IJ SOLR
60.0000 mg | Freq: Every day | INTRAMUSCULAR | Status: DC
  Administered 2018-12-15 – 2018-12-18 (×4): 60 mg via INTRAVENOUS
  Filled 2018-12-15 (×4): qty 2

## 2018-12-15 MED ORDER — ORAL CARE MOUTH RINSE
15.0000 mL | Freq: Two times a day (BID) | OROMUCOSAL | Status: DC
  Administered 2018-12-15 – 2018-12-18 (×2): 15 mL via OROMUCOSAL

## 2018-12-15 NOTE — Progress Notes (Signed)
New orders rec'd for ABG, CXR and transfer to ICU 2H. Pt transferring to 2H18 via bed no belongings at bedside. Report given to christina RN

## 2018-12-15 NOTE — Significant Event (Signed)
Rapid Response Event Note  Overview:  RT Larkin Ina contacted me regarding a significant change in pt's work of breathing.     Initial Focused Assessment: On arrival pt sitting up on side of bed, pt struggling to breath, using accessory muscles, O2 sats 100% on NRB. Erin PA at bedside, spoke with Dr. Roxan Hockey. Pt transferred to 2h18  Interventions: Transfer to 2h   Event Summary:   at      at          [facility], Sela Hua

## 2018-12-15 NOTE — Consult Note (Addendum)
NAME:  Randall Sullivan, MRN:  [id number], DOB:  Oct 20, 1950, LOS: 6 ADMISSION DATE:  12/09/2018, CONSULTATION DATE:  12/15/18 REFERRING MD:  Modesto Charon, MD CHIEF COMPLAINT:  Increased WOB  Brief History   Randall Sullivan is a 68 year old male with very severe COPD admitted for RUL resection for RUL nodule and blebs with post-op course complicated with PAL. Worsening respiratory status this morning requiring increased O2. Transferred to ICU. PCCM consulted hypoxemic respiratory failure  History of present illness   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  Return to the OR on 3/13 for endobronchial valve insertion in the anterior, apical and posterior segments of the right upper lobe and superior segment of the right lower lobe for persistent air leak.  Per primary team subcutaneous emphysema initially was improving however on airleak remained.  14 French pigtail placed on 3/15. 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  2 French pigtail 3/16 2nd VATS deferred due to fever 3/17 transferred to ICU for increased work of breathing  Consults:  PCCM  Procedures:  3/11 VATS and RUL wedge resection and bleb resection 3/13 endobronchial valve insertion 11/1512 French pigtail  Significant Diagnostic Tests:    Micro Data:  BCx  3/17 RC 3/17  Antimicrobials:  Vanc 3/16 Zosyn 3/16  Interim history/subjective:  Reports shortness of breath. Improved after placed on humidified high flow  Objective   Blood pressure 132/80, pulse 99, temperature 99.1 F (37.3 C), temperature source Oral, resp. rate (!) 30, height 5\' 10"  (1.778 m), weight 93.3 kg, SpO2 96 %.    FiO2 (%):  [50 %-100 %] 50 %   Intake/Output Summary (Last 24 hours) at 12/15/2018 1223 Last data filed at 12/15/2018 1245 Gross per 24 hour  Intake 663.86 ml  Output 630 ml  Net 33.86 ml   Filed Weights   12/09/18 1226  Weight: 93.3 kg    Physical Exam: General: Obese male with marked subcutaneous emphysema, sitting in bed on face mask, respiratory distress however able to speak full sentences HENT: [place], AT, OP clear, MMM Eyes: EOMI, no scleral icterus Respiratory: Diminished breath sounds bilaterally. Wheezes present. Severe subcutaneous emphysema.  Chest tube x2 on suction with persistent air leak Cardiovascular: RRR, -M/R/G, no JVD GI: BS+, soft, nontender Extremities:-Edema,-tenderness Neuro: AAO x4, CNII-XII grossly intact Skin: Intact, no rashes or bruising Psych: Normal mood, normal affect GU: No foley in place   Resolved Hospital Problem list     Assessment & Plan:    Acute hypoxemic respiratory failure, multifactorial Influenza pneumonia, unable to rule out co-comitant bacterial infection Plan: Transition from face mask to humidified high flow High risk for intubation. Monitor respiratory status ABG as needed Continue Tamiflu Continue Vanc and Zosyn Nebulizers PRN  Post-op persistent air leak  Continue chest tube to suction - managed by CTS  AF RVR  Diltiazem gtt and PO Metoprolol   Best practice:  Diet: NPO Pain/Anxiety/Delirium protocol (if indicated): -- VAP protocol (if indicated): -- DVT prophylaxis: Lovenox GI prophylaxis:  Glucose control:  Mobility: BR Code Status: Full Family Communication: Updated  patient, no family at bedside Disposition: Transfer to ICU  Labs   CBC: Recent Labs  Lab 12/10/18 0700 12/11/18 0317 12/12/18 0417 12/13/18 0246 12/14/18 0330  WBC 18.9* 20.1* 20.2* 20.7* 14.2*  HGB 11.8* 13.5 12.0* 12.4* 12.3*  HCT 37.0* 43.0 39.4 40.2 38.8*  MCV 85.5 86.2 86.0 87.2 85.5  PLT 267 332 281 349 330    Basic Metabolic Panel: Recent Labs  Lab 12/10/18 0700 12/11/18 0317 12/12/18 0417 12/13/18 0246  NA 138 133* 136 137  K 4.0 3.8 4.4 4.2  CL 105 101 100 99  CO2 26 25 27 26   GLUCOSE 130* 98 129* 88  BUN 10 12 13 15   CREATININE 0.86 1.01 0.98 0.98  CALCIUM 8.1* 8.1* 8.3* 8.6*   GFR: Estimated Creatinine Clearance: 82.8 mL/min (by C-G formula based on SCr of 0.98 mg/dL). Recent Labs  Lab 12/11/18 0317 12/12/18 0417 12/13/18 0246 12/14/18 0330  WBC 20.1* 20.2* 20.7* 14.2*    Liver Function Tests: Recent Labs  Lab 12/11/18 0317  AST 26  ALT 15  ALKPHOS 44  BILITOT 1.4*  PROT 6.5  ALBUMIN 3.2*   No results for input(s): LIPASE, AMYLASE in the last 168 hours. No results for input(s): AMMONIA in the last 168 hours.  ABG    Component Value Date/Time   PHART 7.462 (H) 12/15/2018 1143   PCO2ART 42.5 12/15/2018 1143   PO2ART 72.3 (L) 12/15/2018 1143   HCO3 30.0 (H) 12/15/2018 1143   O2SAT 95.5 12/15/2018 1143     Coagulation Profile: No results for input(s): INR, PROTIME in the last 168 hours.  Cardiac Enzymes: No results for input(s): CKTOTAL, CKMB, CKMBINDEX, TROPONINI in the last 168 hours.  HbA1C: No results found for: HGBA1C  CBG: Recent Labs  Lab 12/11/18 1615 12/11/18 1944 12/11/18 2314 12/12/18 0342 12/12/18 0820  GLUCAP 114* 116* 135* 113* 99    Review of Systems:   Review of Systems  Constitutional: Positive for fever and malaise/fatigue. Negative for chills, diaphoresis and weight loss.  HENT: Negative for congestion, ear pain and sore throat.   Eyes: Positive for blurred vision.  Respiratory: Positive for cough  and shortness of breath. Negative for hemoptysis, sputum production and wheezing.   Cardiovascular: Negative for chest pain, palpitations and leg swelling.  Gastrointestinal: Negative for abdominal pain, heartburn and nausea.  Genitourinary: Negative for frequency.  Musculoskeletal: Negative for joint pain and myalgias.  Skin: Negative for itching and rash.  Neurological: Negative for dizziness, weakness and headaches.  Endo/Heme/Allergies: Does not bruise/bleed easily.  Psychiatric/Behavioral: Negative for depression. The patient is not nervous/anxious.     Past Medical History  He,  has a past medical history of A-fib ([place]) (2017), Arthritis, Complication of anesthesia, Emphysema, History of lung cancer, Hypertension, and Tobacco abuse.   Surgical History    Past Surgical History:  Procedure Laterality Date  . HERNIA REPAIR Bilateral 0762   umbilical  . VIDEO ASSISTED THORACOSCOPY (VATS)/ LOBECTOMY Left 08/01/2016   Procedure: VIDEO ASSISTED THORACOSCOPY (VATS)/ LEFT UPPER LOBECTOMY with lymph node sampling and onQ placement;  Surgeon: [name], MD;  Location: [facility];  Service: Thoracic;  Laterality: Left;  [name] VIDEO ASSISTED THORACOSCOPY (VATS)/WEDGE RESECTION Right 12/09/2018   Procedure: VIDEO ASSISTED THORACOSCOPY (VATS)/WEDGE RESECTION;  Surgeon:  [name], MD;  Location: [facility];  Service: Thoracic;  Laterality: Right;  [name] VIDEO BRONCHOSCOPY WITH ENDOBRONCHIAL NAVIGATION N/A 07/03/2016   Procedure: VIDEO BRONCHOSCOPY WITH ENDOBRONCHIAL NAVIGATION;  Surgeon: [name], MD;  Location: [facility];  Service: Thoracic;  Laterality: N/A;  . VIDEO BRONCHOSCOPY WITH INSERTION OF INTERBRONCHIAL VALVE (IBV) N/A 12/11/2018   Procedure: VIDEO BRONCHOSCOPY WITH INSERTION OF INTERBRONCHIAL VALVE (IBV);  Surgeon: [name], MD;  [facility] OR;  Service: Thoracic;  Laterality: N/A;     Social History   reports that he quit smoking about 2 years ago. His  smoking use included cigarettes. He has a 40.00 pack-year smoking history. He has never used smokeless tobacco. He reports previous alcohol use. He reports that he does not use drugs.   Family History   His family history includes Aneurysm in his sister; Asthma in his brother; Cancer in his mother and sister; Cancer - Lung in his brother; Kidney disease in his paternal aunt.   Allergies Allergies  Allergen Reactions  . No Known Allergies      Home Medications  Prior to Admission medications   Medication Sig Start Date End Date Taking? Authorizing Provider  acetaminophen (TYLENOL) 500 MG tablet Take 1,000 mg by mouth every 6 (six) hours as needed (for arthritis pain.).    Yes [provider]  albuterol (PROVENTIL) (2.5 MG/3ML) 0.083% nebulizer solution Take 2.5 mg by nebulization 4 (four) times daily.  04/09/17  Yes [provider]  aspirin EC 81 MG tablet Take 81 mg by mouth daily.   Yes [provider]  meclizine (ANTIVERT) 25 MG tablet Take 1 tablet (25 mg total) by mouth 3 (three) times daily as needed for dizziness. May cause drowsiness 09/01/17  Yes Triplett, Tammy, PA-C  metoprolol succinate (TOPROL-XL) 50 MG 24 hr tablet Take 50 mg by mouth daily. Take with or immediately following a meal.   Yes [provider]  fexofenadine (ALLEGRA) 180 MG tablet Take 180 mg by mouth daily as needed for allergies or rhinitis.    [provider]     Critical care time: 50 min  The patient is critically ill with multiple organ systems failure and requires high complexity decision making for assessment and support, frequent evaluation and titration of therapies, application of advanced monitoring technologies and extensive interpretation of multiple databases.   Critical Care Time devoted to patient care services described in this note is 50 Minutes. This time reflects time of care of this signee Dr. Rodman Pickle. This critical care time does not reflect  procedure time, or teaching time or supervisory time of PA/NP/Med student/Med Resident etc but could involve care discussion time.  Rodman Pickle, M.D. [facility] Pulmonary/Critical Care Medicine Pager: [phone] After hours pager: [phone]

## 2018-12-15 NOTE — Progress Notes (Signed)
Paged Ellwood Handler PA, pt struggling to breath, tachepnic, breathing treatment was just given with no relief. Will come assess patient.

## 2018-12-15 NOTE — Plan of Care (Signed)
  Problem: Education: Goal: Knowledge of General Education information will improve Description Including pain rating scale, medication(s)/side effects and non-pharmacologic comfort measures Outcome: Progressing   Problem: Health Behavior/Discharge Planning: Goal: Ability to manage health-related needs will improve Outcome: Progressing   Problem: Clinical Measurements: Goal: Ability to maintain clinical measurements within normal limits will improve Outcome: Progressing Goal: Will remain free from infection Outcome: Progressing Goal: Diagnostic test results will improve Outcome: Progressing Goal: Respiratory complications will improve Outcome: Progressing Goal: Cardiovascular complication will be avoided Outcome: Progressing   Problem: Activity: Goal: Risk for activity intolerance will decrease Outcome: Progressing   Problem: Nutrition: Goal: Adequate nutrition will be maintained Outcome: Progressing   Problem: Coping: Goal: Level of anxiety will decrease Outcome: Progressing   Problem: Pain Managment: Goal: General experience of comfort will improve Outcome: Progressing   Problem: Safety: Goal: Ability to remain free from injury will improve Outcome: Progressing   Problem: Skin Integrity: Goal: Risk for impaired skin integrity will decrease Outcome: Progressing   Problem: Education: Goal: Knowledge of disease or condition will improve Outcome: Progressing Goal: Knowledge of the prescribed therapeutic regimen will improve Outcome: Progressing   Problem: Activity: Goal: Risk for activity intolerance will decrease Outcome: Progressing   Problem: Cardiac: Goal: Will achieve and/or maintain hemodynamic stability Outcome: Progressing   Problem: Clinical Measurements: Goal: Postoperative complications will be avoided or minimized Outcome: Progressing   Problem: Respiratory: Goal: Respiratory status will improve Outcome: Progressing   Problem: Pain  Management: Goal: Pain level will decrease Outcome: Progressing   Problem: Skin Integrity: Goal: Wound healing without signs and symptoms infection will improve Outcome: Progressing

## 2018-12-15 NOTE — Progress Notes (Addendum)
      [address]       [place] [address]             [phone]      4 Days Post-Op Procedure(s) (LRB): VIDEO BRONCHOSCOPY WITH INSERTION OF INTERBRONCHIAL VALVE (IBV) (N/A)  Subjective:  No new complaints.  Continues to feel short of breath, states feels a little better than yesterday.  Objective: Vital signs in last 24 hours: Temp:  [98.4 F (36.9 C)-103.7 F (39.8 C)] 99.3 F (37.4 C) (03/17 0600) Pulse Rate:  [95-101] 99 (03/17 0400) Cardiac Rhythm: Normal sinus rhythm (03/17 0405) Resp:  [22-26] 26 (03/17 0400) BP: (151-159)/(78-84) 157/79 (03/17 0400) SpO2:  [90 %-96 %] 94 % (03/17 0737)  Intake/Output from previous day: 03/16 0701 - 03/17 0700 In: 663.9 [I.V.:113.3; IV Piggyback:550.6] Out: 1111 [Urine:875; Chest Tube:236]  General appearance: alert, cooperative, no distress and extensive sub q emphysema along face, neck, both eyes Heart: regular rate and rhythm Lungs: wheezes bilaterally Abdomen: soft, non-tender; bowel sounds normal; no masses,  no organomegaly Extremities: extremities normal, atraumatic, no cyanosis or edema Wound: clean and dry  Lab Results: Recent Labs    12/13/18 0246 12/14/18 0330  WBC 20.7* 14.2*  HGB 12.4* 12.3*  HCT 40.2 38.8*  PLT 349 299   BMET:  Recent Labs    12/13/18 0246  NA 137  K 4.2  CL 99  CO2 26  GLUCOSE 88  BUN 15  CREATININE 0.98  CALCIUM 8.6*    PT/INR: No results for input(s): LABPROT, INR in the last 72 hours. ABG    Component Value Date/Time   PHART 7.398 12/10/2018 0700   HCO3 27.6 12/10/2018 0700   O2SAT 96.6 12/10/2018 0700   CBG (last 3)  Recent Labs    12/12/18 0820  GLUCAP 99    Assessment/Plan: S/P Procedure(s) (LRB): VIDEO BRONCHOSCOPY WITH INSERTION OF INTERBRONCHIAL VALVE (IBV) (N/A)  1. Chest tube- #1 with air leak, #2 no air leak- leave both chest tubes on suction 2. Pulm- extensive sub q emphysema, no pneumothorax appreciated 3. Influenza A- continues  to have low grade fever, Tamiflu has been ordered, on Vanc/Zosyn for likely pneumonia as well 4. CV- hemodynamically stable, continue Lopressor, Cardizem 5. Lovenox for DVT prophylaxis 6. Dispo- patient stable, sub q air remains stable, no pneumothorax on CXR, CT #1 with air leak, leave both tubes on suction,  + Flu A continue Tamiflu, Vanc/zosyn for likely pneumonia, repeat CXR in AM   LOS: 6 days    Ellwood Handler 12/15/2018 Patient seen and examined agree with above + influenza A- tamiflu started, I suspect fever is hospital acquired so will continue IV antibiotics Still with large air leak from chest tube, minimal leak from pigtail SQ emphysema minimally changed Anticipate return to OR later this week  Remo Lipps C. Roxan Hockey, MD [facility] [phone]

## 2018-12-15 NOTE — Progress Notes (Signed)
Patient having increased SOB and restless.  States has to sit up in order to breath.  RT in attendance.  Neb treatment ineffective.  Physician extenders in attendance as well as rapid response nurse.  CCM and Dr. Roxan Hockey apprised of patient's decline in status.  Patient to be transferred to ICU due to rapid respiratory decline leading to compromise in aeration.

## 2018-12-15 NOTE — Progress Notes (Signed)
Contacted via nursing in regards to patient complaining of worsening shortness of breath.  Upon arrival the patient is sitting up on side of bed visibly short of breath.  He states that he can't breath and get his breath at all.    BP 132/80   Pulse 99   Temp 99.1 F (37.3 C) (Oral)   Resp (!) 30   Ht 5\' 10"  (1.778 m)   Wt 93.3 kg   SpO2 96%   BMI 29.51 kg/m   Gen: visibly distressed Heart; RRR Lungs: Tachypneic, diminished on right base Chest tubes- remains in place, + air leak Extensive Sub Q emphysema noted, slightly worsened from this morning   A/P:  1. Respiratory distress- underlying COPD, now Influenza A positive with likely viral pneumonia 2. CXR obtained- was negative for pneumothorax 3. Transfer to ICU, Critical Care Consult Obtained, Dr. Roxan Hockey at bedside to evaluate   Ellwood Handler PA-C [phone]

## 2018-12-15 NOTE — Progress Notes (Signed)
      [address]       [place] [address]             [phone]      Moved to ICU earlier due to respiratory distress He became tachypneic and complained of feeling like he couldn't breathe By the time he arrived in ICU was breathing more easily and did not require intubation He currently feels like he can breathe Ok BP (!) 136/97   Pulse 88   Temp 99.7 F (37.6 C) (Oral)   Resp (!) 27   Ht 5\' 10"  (1.778 m)   Wt 93.3 kg   SpO2 93%   BMI 29.51 kg/m   + air leak from pigtail, no leak noted from CT.    Pigtail may have twisted resulting in decompensation earlier today  Remo Lipps C. Roxan Hockey, MD [facility] [phone]

## 2018-12-16 ENCOUNTER — Inpatient Hospital Stay (HOSPITAL_COMMUNITY): Payer: Medicare Other

## 2018-12-16 DIAGNOSIS — J101 Influenza due to other identified influenza virus with other respiratory manifestations: Secondary | ICD-10-CM

## 2018-12-16 DIAGNOSIS — J441 Chronic obstructive pulmonary disease with (acute) exacerbation: Secondary | ICD-10-CM

## 2018-12-16 DIAGNOSIS — J9601 Acute respiratory failure with hypoxia: Secondary | ICD-10-CM

## 2018-12-16 LAB — BASIC METABOLIC PANEL
Anion gap: 13 (ref 5–15)
BUN: 22 mg/dL (ref 8–23)
CALCIUM: 8.1 mg/dL — AB (ref 8.9–10.3)
CO2: 26 mmol/L (ref 22–32)
Chloride: 96 mmol/L — ABNORMAL LOW (ref 98–111)
Creatinine, Ser: 1.12 mg/dL (ref 0.61–1.24)
GFR calc Af Amer: >60 mL/min (ref >60)
Glucose, Bld: 108 mg/dL — ABNORMAL HIGH (ref 70–99)
Potassium: 3.7 mmol/L (ref 3.5–5.1)
Sodium: 135 mmol/L (ref 135–145)

## 2018-12-16 LAB — VANCOMYCIN, TROUGH: Vancomycin Tr: 13 ug/mL — ABNORMAL LOW (ref 15–20)

## 2018-12-16 MED ORDER — LEVALBUTEROL HCL 0.63 MG/3ML IN NEBU
0.6300 mg | INHALATION_SOLUTION | Freq: Three times a day (TID) | RESPIRATORY_TRACT | Status: DC
  Administered 2018-12-16 – 2019-01-06 (×62): 0.63 mg via RESPIRATORY_TRACT
  Filled 2018-12-16 (×64): qty 3

## 2018-12-16 MED ORDER — LACTULOSE 10 GM/15ML PO SOLN
20.0000 g | Freq: Once | ORAL | Status: AC
  Administered 2018-12-16: 20 g via ORAL
  Filled 2018-12-16: qty 30

## 2018-12-16 MED ORDER — LEVALBUTEROL HCL 0.63 MG/3ML IN NEBU
0.6300 mg | INHALATION_SOLUTION | RESPIRATORY_TRACT | Status: DC | PRN
  Administered 2018-12-20 – 2019-01-05 (×23): 0.63 mg via RESPIRATORY_TRACT
  Filled 2018-12-16 (×24): qty 3

## 2018-12-16 MED ORDER — POTASSIUM CHLORIDE ER 10 MEQ PO TBCR
40.0000 meq | EXTENDED_RELEASE_TABLET | Freq: Once | ORAL | Status: AC
  Administered 2018-12-16: 40 meq via ORAL
  Filled 2018-12-16 (×2): qty 4

## 2018-12-16 MED ORDER — VANCOMYCIN HCL 10 G IV SOLR
1500.0000 mg | Freq: Two times a day (BID) | INTRAVENOUS | Status: AC
  Administered 2018-12-17 – 2018-12-21 (×10): 1500 mg via INTRAVENOUS
  Filled 2018-12-16 (×11): qty 1500

## 2018-12-16 NOTE — Progress Notes (Signed)
5 Days Post-Op Procedure(s) (LRB): VIDEO BRONCHOSCOPY WITH INSERTION OF INTERBRONCHIAL VALVE (IBV) (N/A) Subjective: Feels better this morning. Wants a "Sun Drop" No BM recently  Objective: Vital signs in last 24 hours: Temp:  [97.6 F (36.4 C)-99.7 F (37.6 C)] 97.6 F (36.4 C) (03/18 0746) Pulse Rate:  [68-101] 80 (03/18 0700) Cardiac Rhythm: Normal sinus rhythm (03/17 2000) Resp:  [17-37] 20 (03/18 0700) BP: (104-143)/(55-97) 119/81 (03/18 0700) SpO2:  [92 %-97 %] 97 % (03/18 0700) FiO2 (%):  [40 %-100 %] 40 % (03/17 2348)  Hemodynamic parameters for last 24 hours:    Intake/Output from previous day: 03/17 0701 - 03/18 0700 In: 1309.1 [P.O.:480; I.V.:77.9; IV Piggyback:751.2] Out: 725 [Urine:725] Intake/Output this shift: No intake/output data recorded.  General appearance: alert, cooperative and no distress Neurologic: intact Heart: irregularly irregular rhythm Lungs: wheezes bilaterally Abdomen: normal findings: soft, non-tender + air leak  Lab Results: Recent Labs    12/14/18 0330  WBC 14.2*  HGB 12.3*  HCT 38.8*  PLT 299   BMET:  Recent Labs    12/16/18 0308  NA 135  K 3.7  CL 96*  CO2 26  GLUCOSE 108*  BUN 22  CREATININE 1.12  CALCIUM 8.1*    PT/INR: No results for input(s): LABPROT, INR in the last 72 hours. ABG    Component Value Date/Time   PHART 7.462 (H) 12/15/2018 1143   HCO3 30.0 (H) 12/15/2018 1143   O2SAT 95.5 12/15/2018 1143   CBG (last 3)  No results for input(s): GLUCAP in the last 72 hours.  Assessment/Plan: S/P Procedure(s) (LRB): VIDEO BRONCHOSCOPY WITH INSERTION OF INTERBRONCHIAL VALVE (IBV) (N/A) -He looks much better this AM SQ emphysema is significantly decreased Air leak is better as well Will leave both tubes to 40 cm of suction today- if continues to decrease will try to gradually wean Influenza A- on Tamiflu Hospital acquired pneumonia- continue empiric Vanco and Zosyn Mobilize as tolerated Atrial fib- rate  controlled on PO diltiazem, on prophylactic enoxaparin   LOS: 7 days    [name] 12/16/2018

## 2018-12-16 NOTE — Progress Notes (Addendum)
NAME:  Randall Sullivan, MRN:  [id number], DOB:  1950/10/13, LOS: 7 ADMISSION DATE:  12/09/2018, CONSULTATION DATE:  12/15/18 REFERRING MD:  Modesto Charon, MD CHIEF COMPLAINT:  Increased WOB  Brief History   Randall Sullivan is a 69 year old male former smoker ( 40 pack year history quit 2017)  with very severe COPD admitted for RUL resection for RUL nodule and blebs with post-op course complicated with PAL. Worsening respiratory status this morning requiring increased O2. Transferred to ICU. 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  Return to the OR on 3/13 for endobronchial valve insertion in the anterior, apical and posterior segments of the right upper lobe and superior segment of the right lower lobe for persistent air leak.  Per primary team subcutaneous emphysema initially was improving however on airleak remained.  14 French pigtail placed on 3/15. Past Medical History  Very severe COPD Stage Ia adenocarcinoma of the left lung status post left upper lobe resection in 2017 Right upper lobe lung nodule>> wedge resection 12/09/18  Significant Hospital Events   3/11 admitted for VATS and RUL wedge resection and bleb resection 3/13 endobronchial valve insertion of anterior, apical and posterior segments of the right upper lobe and superior segment of right lower lobe for persistent air leak 3/15 second chest tube placed.  14 French pigtail 3/16 2nd VATS deferred due to fever 3/17 transferred to ICU for increased work of breathing 3/18 Doing well on HFNC 40% ( 25 L)  Consults:  PCCM  Procedures:  3/11 : VATS and RUL wedge resection and bleb : INVASIVE  MODERATELY DIFFERENTIATED ADENOCARCINOMA, 0.7 CM, ACINAR PREDOMINANT. 3/13 : endobronchial valve insertion 3/15: 14 French pigtail chest tube with recurrent leak and subcutaneous air   Significant Diagnostic Tests:  11/2018 PFT's  Severe Obstructive Airways Disease -Emphysematous Type Slight response to bronchodilator Overinflation with airtrapping DLCO moderately reduced  Micro Data:  BCx 3/17 RC 3/17  Antimicrobials:  Vanc 3/16 Zosyn 3/16  Interim history/subjective:  States he is doing better. States he does have some chest tightness. Continued subcutaneous air upper chest and face  Objective   Blood pressure 122/70, pulse 69, temperature 97.6 F (36.4 C), temperature source Oral, resp. rate (!) 21, height 5\' 10"  (1.778 m), weight 93.3 kg, SpO2 96 %.    FiO2 (%):  [40 %-100 %] 40 %   Intake/Output Summary (Last 24 hours) at 12/16/2018 1056 Last data filed at 12/16/2018 0900 Gross per 24 hour  Intake 2054.02 ml  Output 925 ml  Net 1129.02 ml   Filed Weights   12/09/18 1226  Weight: 93.3 kg    Physical Exam: General: Obese male with marked subcutaneous emphysema, sitting in bed on high flow nasal canula, able to speak in full sentences HENT: Randall Sullivan, AT, OP clear, MMM, SQ air  Eyes: EOMI, no scleral icterus, peri orbital edema bilaterally Respiratory: Diminished breath sounds bilaterally. Few Wheezes present. Severe subcutaneous emphysema.  Chest tube x2 to  suction with persistent air leak Cardiovascular: S1, S2, Irr/ reg, Atrial fibrillation per tele, , No M/R/G, no JVD GI: BS+, soft, non tender, ND, BS +, obese Extremities:-Edema,-tenderness, normal bulk, no obvious deformities Neuro:  AAO x4, CNII-XII grossly intact, appropriate Skin: Intact, no rashes or bruising, warm and dry to touch Psych: Normal mood, normal affect, appropriate GU: No foley in place   Resolved Hospital Problem list     Assessment & Plan:    Acute hypoxemic respiratory failure,  multifactorial COPD>> Severe with decreased DLCO Pneumonia: Hospital Acquired vs viral Influenza pneumonia, unable to rule out co-comitant bacterial infection CXR 3/18>> Tiny residual RIGHT pneumothorax  Continued air leak in 1 of 7 chambers larger CT.  Plan: Continue HFNC and wean as able for saturations of 88-92% Remains High risk for intubation. Monitor respiratory status closely ABG prn any decline in respiratory  status Continue Tamiflu, Day 3 of 5 Continue Vanc and Zosyn Continue Xopenex TID ( Atrial Fibrillation) Will add prn Xopenex for break through Aggressive pulmonary toilet OOB to chair PT/OT when able   Post-op persistent air leak  Continued Air leak in 1 of 7 chambers. Significant Sub Cutaneous air Continue chest tube to suction - managed by CTS  AF RVR Diltiazem gtt and PO Metoprolol Per TCTS   Best practice:  Diet: NPO Pain/Anxiety/Delirium protocol (if indicated): NA VAP protocol (if indicated): NA DVT prophylaxis: Lovenox GI prophylaxis:  Glucose control: CBG Q 4 with SSI Mobility: BR Code Status: Full Family Communication: Updated patient 3/18 , no family at bedside Disposition: ICU for now  Labs   CBC: Recent Labs  Lab 12/10/18 0700 12/11/18 0317 12/12/18 0417 12/13/18 0246 12/14/18 0330  WBC 18.9* 20.1* 20.2* 20.7* 14.2*  HGB 11.8* 13.5 12.0* 12.4* 12.3*  HCT 37.0* 43.0 39.4 40.2 38.8*  MCV 85.5 86.2 86.0 87.2 85.5  PLT 267 332 281 349 976    Basic Metabolic Panel: Recent Labs  Lab 12/10/18 0700 12/11/18 0317 12/12/18 0417 12/13/18 0246 12/16/18 0308  NA 138 133* 136 137 135  K 4.0 3.8 4.4 4.2 3.7  CL 105 101 100 99 96*  CO2 26 25 27 26 26   GLUCOSE 130* 98 129* 88 108*  BUN 10 12 13 15 22   CREATININE 0.86 1.01 0.98 0.98 1.12  CALCIUM 8.1* 8.1* 8.3* 8.6* 8.1*   GFR: Estimated Creatinine Clearance: 72.4 mL/min (by C-G formula based on SCr of 1.12 mg/dL). Recent Labs  Lab 12/11/18 0317 12/12/18 0417 12/13/18 0246  12/14/18 0330  WBC 20.1* 20.2* 20.7* 14.2*    Liver Function Tests: Recent Labs  Lab 12/11/18 0317  AST 26  ALT 15  ALKPHOS 44  BILITOT 1.4*  PROT 6.5  ALBUMIN 3.2*   No results for input(s): LIPASE, AMYLASE in the last 168 hours. No results for input(s): AMMONIA in the last 168 hours.  ABG    Component Value Date/Time   PHART 7.462 (H) 12/15/2018 1143   PCO2ART 42.5 12/15/2018 1143   PO2ART 72.3 (L) 12/15/2018 1143   HCO3 30.0 (H) 12/15/2018 1143   O2SAT 95.5 12/15/2018 1143     Coagulation Profile: No results for input(s): INR, PROTIME in the last 168 hours.  Cardiac Enzymes: No results for input(s): CKTOTAL, CKMB, CKMBINDEX, TROPONINI in the last 168 hours.  HbA1C: No results found for: HGBA1C  CBG: Recent Labs  Lab 12/11/18 1615 12/11/18 1944 12/11/18 2314 12/12/18 0342 12/12/18 0820  GLUCAP 114* 116* 135* 113* 99      Past Medical History  He,  has a past medical history of A-fib ([place]) (2017), Arthritis, Complication of anesthesia, Emphysema, History of lung cancer, Hypertension, and Tobacco abuse.   Surgical History    Past Surgical History:  Procedure Laterality Date  . HERNIA REPAIR Bilateral 0254   umbilical  . VIDEO ASSISTED THORACOSCOPY (VATS)/ LOBECTOMY Left 08/01/2016   Procedure: VIDEO ASSISTED THORACOSCOPY (VATS)/ LEFT UPPER LOBECTOMY with lymph node sampling and onQ placement;  Surgeon: [name], MD;  Location: [facility];  Service: Thoracic;  Laterality: Left;  [name] VIDEO ASSISTED THORACOSCOPY (VATS)/WEDGE RESECTION Right 12/09/2018   Procedure: VIDEO ASSISTED THORACOSCOPY (VATS)/WEDGE RESECTION;  Surgeon: [name], MD;  Location: [facility];  Service: Thoracic;  Laterality: Right;  [name] VIDEO BRONCHOSCOPY WITH ENDOBRONCHIAL NAVIGATION N/A 07/03/2016   Procedure: VIDEO BRONCHOSCOPY WITH ENDOBRONCHIAL NAVIGATION;  Surgeon: [name], MD;  Location: [facility];  Service: Thoracic;  Laterality: N/A;  . VIDEO BRONCHOSCOPY WITH  INSERTION OF INTERBRONCHIAL VALVE (IBV) N/A 12/11/2018   Procedure: VIDEO BRONCHOSCOPY WITH INSERTION OF INTERBRONCHIAL VALVE (IBV);  Surgeon: [name], MD;  Location: [facility] OR;  Service: Thoracic;  Laterality: N/A;     Social History   reports that he quit smoking about 2 years ago. His smoking use included cigarettes. He has a 40.00 pack-year smoking history. He has never used smokeless tobacco. He reports previous alcohol use. He reports that he does not use drugs.   Family History   His family history includes Aneurysm in his sister; Asthma in his brother; Cancer in his mother and sister; Cancer - Lung in his brother; Kidney disease in his paternal aunt.   Allergies Allergies  Allergen Reactions  . No Known Allergies      Home Medications  Prior to Admission medications   Medication Sig Start Date End Date Taking? Authorizing Provider  acetaminophen (TYLENOL) 500 MG tablet Take 1,000 mg by mouth every 6 (six) hours as needed (for arthritis pain.).    Yes [provider]  albuterol (PROVENTIL) (2.5 MG/3ML) 0.083% nebulizer solution Take 2.5 mg by nebulization 4 (four) times daily.  04/09/17  Yes [provider]  aspirin EC 81 MG tablet Take 81 mg by mouth daily.   Yes [provider]  meclizine (ANTIVERT) 25 MG tablet Take 1 tablet (25 mg total) by mouth 3 (three) times daily as needed for dizziness. May cause drowsiness 09/01/17  Yes Triplett, Tammy, PA-C  metoprolol succinate (TOPROL-XL) 50 MG 24 hr tablet Take 50 mg by mouth daily. Take with or immediately following a meal.   Yes [provider]  fexofenadine (ALLEGRA) 180 MG tablet Take 180 mg by mouth daily as needed for allergies or rhinitis.    [provider]     Critical Care APP time : 20 minutes   Magdalen Spatz, AGACNP-BC [place] Pager # [phone] If no answer, and after 4 pm call [phone] 12/16/2018 16:35 AM   68 year old  ex-smoker quit 2017 with FEV1 34%/severe COPD underwent wedge resection of right upper lobe nodule 3/15 complicated by persistent air leak and subcutaneous emphysema.  Underwent endobronchial valve placement 3/13 for persistent air leak. He reports decreased dyspnea, remains on high flow oxygen 40% at 25 L with saturation 95% Crepitus over arms chest wall and face Decreased breath sounds bilateral, few rhonchi on right Chest tube shows air leak in large bore chest tube, connected to suction.  Surprisingly flu test was +3/16  PCCM to assist with treatment of COPD exacerbation, chest tube being managed by T CTS. Continue Solu-Medrol 60 every 24, Xopenex nebs-add Atrovent. Empiric antibiotics and Tamiflu to continue for 5 days total  PCCM will follow  Karam Dunson V. Elsworth Soho MD

## 2018-12-16 NOTE — Progress Notes (Signed)
Pharmacy Antibiotic Note  Randall Sullivan is a 68 y.o. male admitted on 12/09/2018 with pneumonia.  Pharmacy has been consulted for vancomycin and Zosyn dosing. Pt noted to be febrile with WBC elevated, redo VATS needed at some point in future.  Vancomycin trough approximately 11 hours after last dose came back at 13, slightly below goal. Scr up slightly from 0.8 to 1.12 today. On concurrent steroids.   Plan: -Zosyn 3.375g IV EI q8h -Increase vancomycin slightly to 1500mg  IV q12h -Monitor LOT, renal funx, cultures -Vancomycin levels as indicated  Height: 5\' 10"  (177.8 cm) Weight: 205 lb 11 oz (93.3 kg) IBW/kg (Calculated) : 73  Temp (24hrs), Avg:98.4 F (36.9 C), Min:97.6 F (36.4 C), Max:99.7 F (37.6 C)  Recent Labs  Lab 12/10/18 0700 12/11/18 0317 12/12/18 0417 12/13/18 0246 12/14/18 0330 12/16/18 0308 12/16/18 1219  WBC 18.9* 20.1* 20.2* 20.7* 14.2*  --   --   CREATININE 0.86 1.01 0.98 0.98  --  1.12  --   VANCOTROUGH  --   --   --   --   --   --  13*    Estimated Creatinine Clearance: 72.4 mL/min (by C-G formula based on SCr of 1.12 mg/dL).    Allergies  Allergen Reactions  . No Known Allergies     Antimicrobials this admission: Vancomycin 3/16 >>  Zosyn 3/16 >>   Dose adjustments this admission: VT 13 on 3/18 - increase to 1500 mg IV every 12 hours  Microbiology results: 3/16 MRSA: neg 3/17 Bcx: NGTD 3/17 TA: ordered  Thank you for allowing pharmacy to be a part of this patient's care.  Antonietta Jewel, PharmD, [place] [facility] Clinical Pharmacist  Pager: [phone] Phone: [phone] Please check AMION for all [facility] numbers 12/16/2018

## 2018-12-16 NOTE — Progress Notes (Signed)
Afebrile. Denies pain. Crepitus improving, 2-3 I/E air leak from chest tube, no leak noted from pigtail this afternoon. Rate controlled Afib this morning, has converted to NSR. SBP/MAP stable. Tolerating diet, bowel meds given, no bm this shift. Nausea resolved with zofran. Void without issue. Up to chair with minimal assist, tolerated well. K replaced. IV abx as ordered. Droplet precautions at all times.

## 2018-12-16 NOTE — Progress Notes (Signed)
Pt in rate controlled afib. MD [facility] aware. Scheduled BB/cardizem given.

## 2018-12-16 NOTE — Progress Notes (Signed)
EVENING ROUNDS NOTE :     [address]       [place] [address]             [phone]                 5 Days Post-Op Procedure(s) (LRB): VIDEO BRONCHOSCOPY WITH INSERTION OF INTERBRONCHIAL VALVE (IBV) (N/A)  Total Length of Stay:  LOS: 7 days  BP 129/79   Pulse 66   Temp 97.8 F (36.6 C) (Oral)   Resp 16   Ht 5\' 10"  (1.778 m)   Wt 93.3 kg   SpO2 93%   BMI 29.51 kg/m   .Intake/Output      03/18 0701 - 03/19 0700   P.O. 1200   I.V. (mL/kg) 25 (0.3)   IV Piggyback 289.9   Total Intake(mL/kg) 1514.9 (16.2)   Urine (mL/kg/hr) 350 (0.3)   Chest Tube 0   Total Output 350   Net +1164.9         . dextrose 5 % and 0.9% NaCl Stopped (12/16/18 1227)  . diltiazem (CARDIZEM) infusion Stopped (12/14/18 1520)  . piperacillin-tazobactam (ZOSYN)  IV 3.375 g (12/16/18 1227)  . potassium chloride    . [START ON 12/17/2018] vancomycin       Lab Results  Component Value Date   WBC 14.2 (H) 12/14/2018   HGB 12.3 (L) 12/14/2018   HCT 38.8 (L) 12/14/2018   PLT 299 12/14/2018   GLUCOSE 108 (H) 12/16/2018   ALT 15 12/11/2018   AST 26 12/11/2018   NA 135 12/16/2018   K 3.7 12/16/2018   CL 96 (L) 12/16/2018   CREATININE 1.12 12/16/2018   BUN 22 12/16/2018   CO2 26 12/16/2018   TSH 1.520 08/08/2016   INR 1.0 12/07/2018   Still air leak,  Sub q air improving   Randall Isaac MD  [facility] [phone] Office [phone] 12/16/2018 7:23 PM

## 2018-12-17 ENCOUNTER — Inpatient Hospital Stay (HOSPITAL_COMMUNITY): Payer: Medicare Other

## 2018-12-17 LAB — BASIC METABOLIC PANEL
Anion gap: 12 (ref 5–15)
BUN: 24 mg/dL — ABNORMAL HIGH (ref 8–23)
CALCIUM: 8.1 mg/dL — AB (ref 8.9–10.3)
CO2: 27 mmol/L (ref 22–32)
Chloride: 97 mmol/L — ABNORMAL LOW (ref 98–111)
Creatinine, Ser: 0.87 mg/dL (ref 0.61–1.24)
GFR calc Af Amer: >60 mL/min (ref >60)
GFR calc non Af Amer: >60 mL/min (ref >60)
Glucose, Bld: 149 mg/dL — ABNORMAL HIGH (ref 70–99)
Potassium: 3.8 mmol/L (ref 3.5–5.1)
Sodium: 136 mmol/L (ref 135–145)

## 2018-12-17 LAB — CBC
HCT: 36.5 % — ABNORMAL LOW (ref 39.0–52.0)
Hemoglobin: 11.3 g/dL — ABNORMAL LOW (ref 13.0–17.0)
MCH: 26.6 pg (ref 26.0–34.0)
MCHC: 31 g/dL (ref 30.0–36.0)
MCV: 85.9 fL (ref 80.0–100.0)
Platelets: 336 10*3/uL (ref 150–400)
RBC: 4.25 MIL/uL (ref 4.22–5.81)
RDW: 14.9 % (ref 11.5–15.5)
WBC: 15.2 10*3/uL — ABNORMAL HIGH (ref 4.0–10.5)
nRBC: 0 % (ref 0.0–0.2)

## 2018-12-17 MED ORDER — DOCUSATE SODIUM 283 MG RE ENEM
1.0000 | ENEMA | Freq: Once | RECTAL | Status: AC
  Administered 2018-12-17: 283 mg via RECTAL
  Filled 2018-12-17: qty 1

## 2018-12-17 MED ORDER — LACTULOSE 10 GM/15ML PO SOLN
20.0000 g | Freq: Once | ORAL | Status: AC
  Administered 2018-12-17: 20 g via ORAL
  Filled 2018-12-17: qty 30

## 2018-12-17 MED ORDER — ALUM & MAG HYDROXIDE-SIMETH 200-200-20 MG/5ML PO SUSP
30.0000 mL | Freq: Three times a day (TID) | ORAL | Status: DC | PRN
  Administered 2018-12-18: 30 mL via ORAL
  Filled 2018-12-17: qty 30

## 2018-12-17 MED ORDER — PANTOPRAZOLE SODIUM 40 MG PO TBEC
40.0000 mg | DELAYED_RELEASE_TABLET | Freq: Every day | ORAL | Status: DC
  Administered 2018-12-17 – 2018-12-19 (×3): 40 mg via ORAL
  Filled 2018-12-17 (×3): qty 1

## 2018-12-17 NOTE — Progress Notes (Signed)
NAME:  Clint Biello, MRN:  [id number], DOB:  29-May-1951, LOS: 8 ADMISSION DATE:  12/09/2018, CONSULTATION DATE:  12/15/18 REFERRING MD:  Modesto Charon, MD CHIEF COMPLAINT:  Increased WOB  Brief History   Mr. Kimoni Pagliarulo is a 68 year old male former smoker ( 40 pack year history quit 2017)  with very severe COPD admitted for RUL resection for RUL nodule and blebs with post-op course complicated with PAL. Worsening respiratory status this morning requiring increased O2. Transferred to ICU. PCCM consulted hypoxemic respiratory failure  History of present illness   Mr. 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  Return to the OR on 3/13 for endobronchial valve insertion in the anterior, apical and posterior segments of the right upper lobe and superior segment of the right lower lobe for persistent air leak.  Per primary team subcutaneous emphysema initially was improving however on airleak remained.  14 French pigtail placed on 3/15. Past Medical History  Very severe COPD Stage Ia adenocarcinoma of the left lung status post left upper lobe resection in 2017 Right upper lobe lung nodule>> wedge resection 12/09/18  Significant Hospital Events   3/11 admitted for VATS and RUL wedge resection and bleb resection 3/13 endobronchial valve insertion of anterior, apical and posterior segments of the right upper lobe and superior segment of right lower lobe for persistent air leak 3/15 second chest tube placed.  14 French pigtail 3/16 2nd VATS deferred due to fever 3/17 transferred to ICU for increased work of breathing 3/18 Doing well on HFNC 40% ( 25 L)  Consults:  PCCM  Procedures:  3/11 : VATS and RUL wedge resection and bleb : INVASIVE  MODERATELY DIFFERENTIATED ADENOCARCINOMA, 0.7 CM, ACINAR PREDOMINANT. 3/13 : endobronchial valve insertion 3/15: 14 French pigtail chest tube with recurrent leak and subcutaneous air   Significant Diagnostic Tests:  11/2018 PFT's  Severe Obstructive Airways Disease -Emphysematous Type Slight response to bronchodilator Overinflation with airtrapping DLCO moderately reduced  Micro Data:  BCx 3/17 ng RC 3/17  Antimicrobials:  Vanc 3/16 Zosyn 3/16  Interim history/subjective:  Afebrile. Breathing much better. Out of bed to chair On 35% / 20 L high flow oxygen   Objective   Blood pressure 117/79, pulse (!) 56, temperature 98 F (36.7 C), temperature source Oral, resp. rate 12, height 5\' 10"  (1.778 m), weight 93.3 kg, SpO2 96 %.    FiO2 (%):  [35 %-40 %] 35 %   Intake/Output Summary (Last 24 hours) at 12/17/2018 1041 Last data filed at 12/17/2018 0729 Gross per 24 hour  Intake 1251 ml  Output 1035 ml  Net 216 ml   Filed Weights   12/09/18 1226  Weight: 93.3 kg    Physical Exam: Acutely ill, no distress, out of bed to chair No pallor, icterus, no JVD No accessory muscle use, crepitus over face upper chest wall and arms, decreased Decreased breath sounds bilateral, no rhonchi S1-S2 regular, no murmur Soft nontender abdomen 1+ edema  Chest tube shows air leak on larger tube, both attached to suction.  X-ray personally reviewed which shows questionable right apical pneumothorax, decreased apical emphysema Endobronchial valves   Resolved Hospital Problem list     Assessment & Plan:    Acute hypoxemic respiratory failure, multifactorial  COPD>> Severe with decreased DLCO Influenza pneumonia: Hospital Acquired vs viral Influenza pneumonia, unable to rule out co-comitant bacterial infection CXR 3/18>> Tiny residual RIGHT pneumothorax  Continued air leak in 1 of 7 chambers larger CT.  Plan:  continue to decrease high flow nasal cannula, goal saturation 95% and  above Continue Tamiflu, 4/ 5 Continue Vanc and Zosyn x 5 days total Continue Solu-Medrol 60 every 24, Xopenex plus Atrovent nebs OOB to chair PT/OT when able   Post-op persistent air leak  Subcutaneous emphysema Continue chest tube to suction - managed by TCTS  AF RVR Diltiazem gtt and PO Metoprolol  Kara Mead MD. FCCP. [facility] Pager [phone] If no response call [phone]     Rakesh V. Elsworth Soho MD

## 2018-12-17 NOTE — Progress Notes (Signed)
Patient ID: Randall Sullivan, male   DOB: 1950-11-22, 68 y.o.   MRN: [id number] TCTS Evening Rounds:  Hemodynamically stable  sats 95% on 15L HFNC Urine output ok No new problems today.

## 2018-12-17 NOTE — Progress Notes (Signed)
6 Days Post-Op Procedure(s) (LRB): VIDEO BRONCHOSCOPY WITH INSERTION OF INTERBRONCHIAL VALVE (IBV) (N/A) Subjective: Feels better today  Objective: Vital signs in last 24 hours: Temp:  [97.7 F (36.5 C)-97.8 F (36.6 C)] 97.7 F (36.5 C) (03/19 0400) Pulse Rate:  [56-87] 56 (03/19 0600) Cardiac Rhythm: Normal sinus rhythm (03/19 0400) Resp:  [12-26] 12 (03/19 0600) BP: (105-159)/(70-94) 117/79 (03/19 0600) SpO2:  [93 %-98 %] 96 % (03/19 0600) FiO2 (%):  [40 %] 40 % (03/19 0400)  Hemodynamic parameters for last 24 hours:    Intake/Output from previous day: 03/18 0701 - 03/19 0700 In: 1995.9 [P.O.:1440; I.V.:118.4; IV Piggyback:437.5] Out: 960 [Urine:700; Chest Tube:260] Intake/Output this shift: Total I/O In: -  Out: 275 [Urine:275]  General appearance: alert, cooperative and no distress Neurologic: intact Heart: regular rate and rhythm Lungs: wheezes bilaterally Abdomen: normal findings: soft, non-tender + air leak  Lab Results: Recent Labs    12/17/18 0342  WBC 15.2*  HGB 11.3*  HCT 36.5*  PLT 336   BMET:  Recent Labs    12/16/18 0308 12/17/18 0342  NA 135 136  K 3.7 3.8  CL 96* 97*  CO2 26 27  GLUCOSE 108* 149*  BUN 22 24*  CREATININE 1.12 0.87  CALCIUM 8.1* 8.1*    PT/INR: No results for input(s): LABPROT, INR in the last 72 hours. ABG    Component Value Date/Time   PHART 7.462 (H) 12/15/2018 1143   HCO3 30.0 (H) 12/15/2018 1143   O2SAT 95.5 12/15/2018 1143   CBG (last 3)  No results for input(s): GLUCAP in the last 72 hours.  Assessment/Plan: S/P Procedure(s) (LRB): VIDEO BRONCHOSCOPY WITH INSERTION OF INTERBRONCHIAL VALVE (IBV) (N/A) - Looks and feels better today Still has an air leak unchanged from yesterday but down from earlier in the week Keep CT to suction today Sq emphysema is markedly better WBC still elevated- on vanco and zosyn empirically for pneumonia- right basilar opacity on CXR Afebrile Inlfuenza A- On Tamiflu   LOS: 8 days    Randall Sullivan 12/17/2018

## 2018-12-18 ENCOUNTER — Inpatient Hospital Stay (HOSPITAL_COMMUNITY): Payer: Medicare Other

## 2018-12-18 LAB — BASIC METABOLIC PANEL
Anion gap: 6 (ref 5–15)
BUN: 21 mg/dL (ref 8–23)
CO2: 30 mmol/L (ref 22–32)
Calcium: 8.3 mg/dL — ABNORMAL LOW (ref 8.9–10.3)
Chloride: 102 mmol/L (ref 98–111)
Creatinine, Ser: 0.87 mg/dL (ref 0.61–1.24)
GFR calc non Af Amer: >60 mL/min (ref >60)
Glucose, Bld: 90 mg/dL (ref 70–99)
Potassium: 3.8 mmol/L (ref 3.5–5.1)
Sodium: 138 mmol/L (ref 135–145)

## 2018-12-18 LAB — CBC
HCT: 39.3 % (ref 39.0–52.0)
Hemoglobin: 11.6 g/dL — ABNORMAL LOW (ref 13.0–17.0)
MCH: 25.6 pg — ABNORMAL LOW (ref 26.0–34.0)
MCHC: 29.5 g/dL — AB (ref 30.0–36.0)
MCV: 86.8 fL (ref 80.0–100.0)
Platelets: 330 10*3/uL (ref 150–400)
RBC: 4.53 MIL/uL (ref 4.22–5.81)
RDW: 14.8 % (ref 11.5–15.5)
WBC: 11.6 10*3/uL — ABNORMAL HIGH (ref 4.0–10.5)
nRBC: 0 % (ref 0.0–0.2)

## 2018-12-18 MED ORDER — METHYLPREDNISOLONE SODIUM SUCC 40 MG IJ SOLR
40.0000 mg | Freq: Every day | INTRAMUSCULAR | Status: AC
  Administered 2018-12-19: 40 mg via INTRAVENOUS
  Filled 2018-12-18: qty 1

## 2018-12-18 NOTE — H&P (View-Only) (Signed)
7 Days Post-Op Procedure(s) (LRB): VIDEO BRONCHOSCOPY WITH INSERTION OF INTERBRONCHIAL VALVE (IBV) (N/A) Subjective: Feels better today, just had BM  Objective: Vital signs in last 24 hours: Temp:  [97.7 F (36.5 C)-98.1 F (36.7 C)] 98.1 F (36.7 C) (03/20 0000) Pulse Rate:  [56-73] 59 (03/20 0700) Cardiac Rhythm: Normal sinus rhythm (03/20 0400) Resp:  [12-24] 18 (03/20 0700) BP: (122-150)/(49-95) 150/77 (03/20 0600) SpO2:  [86 %-98 %] 94 % (03/20 0700) FiO2 (%):  [35 %] 35 % (03/20 0052)  Hemodynamic parameters for last 24 hours:    Intake/Output from previous day: 03/19 0701 - 03/20 0700 In: 2088.8 [P.O.:720; I.V.:114.5; IV Piggyback:1254.4] Out: 1085 [Urine:975; Chest Tube:110] Intake/Output this shift: No intake/output data recorded.  General appearance: alert, cooperative and no distress Neurologic: intact Heart: regular rate and rhythm Lungs: wheezes RUL Abdomen: normal findings: soft, non-tender air leak larger today, SQ emphysema improved  Lab Results: Recent Labs    12/17/18 0342 12/18/18 0456  WBC 15.2* 11.6*  HGB 11.3* 11.6*  HCT 36.5* 39.3  PLT 336 330   BMET:  Recent Labs    12/17/18 0342 12/18/18 0456  NA 136 138  K 3.8 3.8  CL 97* 102  CO2 27 30  GLUCOSE 149* 90  BUN 24* 21  CREATININE 0.87 0.87  CALCIUM 8.1* 8.3*    PT/INR: No results for input(s): LABPROT, INR in the last 72 hours. ABG    Component Value Date/Time   PHART 7.462 (H) 12/15/2018 1143   HCO3 30.0 (H) 12/15/2018 1143   O2SAT 95.5 12/15/2018 1143   CBG (last 3)  No results for input(s): GLUCAP in the last 72 hours.  Assessment/Plan: S/P Procedure(s) (LRB): VIDEO BRONCHOSCOPY WITH INSERTION OF INTERBRONCHIAL VALVE (IBV) (N/A) - Air leak appears larger this AM SQ emphysema has improved significantly Day 4 vanco and zosyn- will give 7 days total On solumedrol and nebs In Sr on PO diltiazem Up in chair as much as possible    LOS: 9 days    [name] 12/18/2018

## 2018-12-18 NOTE — Progress Notes (Signed)
Encouraged to ambulate along the hallway but refused, claimed to have it done later.

## 2018-12-18 NOTE — Progress Notes (Signed)
NAME:  Randall Sullivan, MRN:  [id number], DOB:  1950/11/13, LOS: 9 ADMISSION DATE:  12/09/2018, CONSULTATION DATE:  12/15/18 REFERRING MD:  Modesto Charon, MD CHIEF COMPLAINT:  Increased WOB  Brief History   [name] is a 68 year old male former smoker ( 40 pack year history quit 2017)  with very severe COPD admitted for RUL resection for RUL nodule and blebs with post-op course complicated with PAL. Worsening respiratory status this morning requiring increased O2. Transferred to ICU. PCCM consulted hypoxemic respiratory failure  History of present illness   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  Return to the OR on 3/13 for endobronchial valve insertion in the anterior, apical and posterior segments of the right upper lobe and superior segment of the right lower lobe for persistent air leak.  Per primary team subcutaneous emphysema initially was improving however on airleak remained.  14 French pigtail placed on 3/15. Past Medical History  Very severe COPD Stage Ia adenocarcinoma of the left lung status post left upper lobe resection in 2017 Right upper lobe lung nodule>> wedge resection 12/09/18  Significant Hospital Events   3/11 admitted for VATS and RUL wedge resection and bleb resection 3/13 endobronchial valve insertion of anterior, apical and posterior segments of the right upper lobe and superior segment of right lower lobe for persistent air leak 3/15 second chest tube placed.  14 French pigtail 3/16 2nd VATS deferred due to fever 3/17 transferred to ICU for increased work of breathing 3/18 Doing well on HFNC 40% ( 25 L)  Consults:  PCCM  Procedures:  3/11 : VATS and RUL wedge resection and bleb : INVASIVE  MODERATELY DIFFERENTIATED ADENOCARCINOMA, 0.7 CM, ACINAR PREDOMINANT. 3/13 : endobronchial valve insertion 3/15: 14 French pigtail chest tube with recurrent leak and subcutaneous air   Significant Diagnostic Tests:  11/2018 PFT's  Severe Obstructive Airways Disease -Emphysematous Type Slight response to bronchodilator Overinflation with airtrapping DLCO moderately reduced  Micro Data:  BCx 3/17 ng RC 3/17  Antimicrobials:  Vanc 3/16 Zosyn 3/16  Interim history/subjective:  Afebrile. Breathing much better. Out of bed to chair  On 35% / 10 L high flow oxygen with sats of 92% Continued improvement   Objective   Blood pressure 131/73, pulse 72, temperature 97.8 F (36.6 C), temperature source Oral, resp. rate 13, height 5\' 10"  (1.778 m), weight 93.3 kg, SpO2 92 %.    FiO2 (%):  [35 %] 35 %   Intake/Output Summary (Last 24 hours) at 12/18/2018 0926 Last data filed at 12/18/2018 0700 Gross per 24 hour  Intake 1608.82 ml  Output 810 ml  Net 798.82 ml   Filed Weights   12/09/18 1226  Weight: 93.3 kg    Physical Exam: Awake and alert,  no distress, bed in chair position, remains on high flow [place] No pallor, icterus, no JVD Bilateral chest excursion, No accessory muscle use, crepitus improving in  face upper chest wall and arms, BS diminished per bases, no rhonchi S1-S2 , RRR, no MRG Soft nontender Non-distended abdomen 1+ edema  CT # 1 with 2/7 leak CT # 2 with 3/7 leak  CXR 12/18/2018 Right IJ line right chest tubes in stable position.  No pneumothorax noted on today's exam.  Diffuse bilateral neck and chest wall subcutaneous emphysema again noted. Diffuse right lung infiltrate without change   Resolved Hospital Problem list     Assessment & Plan:    Acute hypoxemic respiratory failure, multifactorial COPD>> Severe with decreased DLCO Influenza pneumonia: Hospital Acquired vs viral Influenza pneumonia, unable to rule out co-comitant bacterial infection CXR  3/18>> Tiny residual RIGHT pneumothorax  Continued air leak as noted above  Plan: Continue to decrease high flow nasal cannula, goal saturation 95% and above Last day Tamiflu 3/20 Continue Vanc and Zosyn x 5 days total Continue Solu-Medrol 60 every 24,  Continue Xopenex plus Atrovent nebs OOB to chair PT/OT when able Continue IS   Post-op persistent air leak  Subcutaneous emphysema Continue chest tube to suction - managed by TCTS  AF RVR Diltiazem gtt and PO Metoprolol  Magdalen Spatz, AGACNP-BC [place] [facility] Pager [phone] If no response call [phone]   12/18/2018 9:27 AM

## 2018-12-18 NOTE — Progress Notes (Signed)
7 Days Post-Op Procedure(s) (LRB): VIDEO BRONCHOSCOPY WITH INSERTION OF INTERBRONCHIAL VALVE (IBV) (N/A) Subjective: Feels better today, just had BM  Objective: Vital signs in last 24 hours: Temp:  [97.7 F (36.5 C)-98.1 F (36.7 C)] 98.1 F (36.7 C) (03/20 0000) Pulse Rate:  [56-73] 59 (03/20 0700) Cardiac Rhythm: Normal sinus rhythm (03/20 0400) Resp:  [12-24] 18 (03/20 0700) BP: (122-150)/(49-95) 150/77 (03/20 0600) SpO2:  [86 %-98 %] 94 % (03/20 0700) FiO2 (%):  [35 %] 35 % (03/20 0052)  Hemodynamic parameters for last 24 hours:    Intake/Output from previous day: 03/19 0701 - 03/20 0700 In: 2088.8 [P.O.:720; I.V.:114.5; IV Piggyback:1254.4] Out: 1085 [Urine:975; Chest Tube:110] Intake/Output this shift: No intake/output data recorded.  General appearance: alert, cooperative and no distress Neurologic: intact Heart: regular rate and rhythm Lungs: wheezes RUL Abdomen: normal findings: soft, non-tender air leak larger today, SQ emphysema improved  Lab Results: Recent Labs    12/17/18 0342 12/18/18 0456  WBC 15.2* 11.6*  HGB 11.3* 11.6*  HCT 36.5* 39.3  PLT 336 330   BMET:  Recent Labs    12/17/18 0342 12/18/18 0456  NA 136 138  K 3.8 3.8  CL 97* 102  CO2 27 30  GLUCOSE 149* 90  BUN 24* 21  CREATININE 0.87 0.87  CALCIUM 8.1* 8.3*    PT/INR: No results for input(s): LABPROT, INR in the last 72 hours. ABG    Component Value Date/Time   PHART 7.462 (H) 12/15/2018 1143   HCO3 30.0 (H) 12/15/2018 1143   O2SAT 95.5 12/15/2018 1143   CBG (last 3)  No results for input(s): GLUCAP in the last 72 hours.  Assessment/Plan: S/P Procedure(s) (LRB): VIDEO BRONCHOSCOPY WITH INSERTION OF INTERBRONCHIAL VALVE (IBV) (N/A) - Air leak appears larger this AM SQ emphysema has improved significantly Day 4 vanco and zosyn- will give 7 days total On solumedrol and nebs In Sr on PO diltiazem Up in chair as much as possible    LOS: 9 days    Randall Sullivan 12/18/2018

## 2018-12-18 NOTE — Progress Notes (Signed)
Transferred -in from [place] by bed awake and  Alert.

## 2018-12-18 NOTE — Progress Notes (Addendum)
Patient transferred to 2C11.

## 2018-12-19 ENCOUNTER — Inpatient Hospital Stay (HOSPITAL_COMMUNITY): Payer: Medicare Other

## 2018-12-19 MED ORDER — CHLORHEXIDINE GLUCONATE CLOTH 2 % EX PADS: 6.0000 | MEDICATED_PAD | Freq: Every day | CUTANEOUS | Status: DC

## 2018-12-19 MED ORDER — PANTOPRAZOLE SODIUM 40 MG PO TBEC
40.0000 mg | DELAYED_RELEASE_TABLET | Freq: Two times a day (BID) | ORAL | Status: DC
  Administered 2018-12-19 – 2019-01-06 (×36): 40 mg via ORAL
  Filled 2018-12-19 (×36): qty 1

## 2018-12-19 MED ORDER — DILTIAZEM HCL ER 60 MG PO CP12
60.0000 mg | ORAL_CAPSULE | Freq: Two times a day (BID) | ORAL | Status: DC
  Administered 2018-12-19 – 2019-01-06 (×33): 60 mg via ORAL
  Filled 2018-12-19 (×41): qty 1

## 2018-12-19 NOTE — Progress Notes (Addendum)
[address]       [employer] [id number]             [phone]      8 Days Post-Op Procedure(s) (LRB): VIDEO BRONCHOSCOPY WITH INSERTION OF INTERBRONCHIAL VALVE (IBV) (N/A) Subjective: Having acid reflux symptoms, otherwise pretty comfortable, denies significant SOB  Objective: Vital signs in last 24 hours: Temp:  [97.5 F (36.4 C)-97.7 F (36.5 C)] 97.7 F (36.5 C) (03/21 0745) Pulse Rate:  [53-65] 65 (03/21 0916) Cardiac Rhythm: Sinus bradycardia (03/21 0700) Resp:  [14-20] 18 (03/21 0916) BP: (125-167)/(78-99) 167/92 (03/21 0745) SpO2:  [80 %-100 %] 99 % (03/21 0745) FiO2 (%):  [8 %] 8 % (03/20 2002)  Hemodynamic parameters for last 24 hours:    Intake/Output from previous day: 03/20 0701 - 03/21 0700 In: 812.4 [IV Piggyback:812.4] Out: 830 [Urine:700; Chest Tube:130] Intake/Output this shift: Total I/O In: 240 [P.O.:240] Out: 100 [Urine:100]  General appearance: alert, cooperative and no distress Heart: regular rate and rhythm Lungs: dim right lower fields>left Abdomen: some distension, nontender to palp Extremities: no calf tenderness Wound: incis healing well  Lab Results: Recent Labs    12/17/18 0342 12/18/18 0456  WBC 15.2* 11.6*  HGB 11.3* 11.6*  HCT 36.5* 39.3  PLT 336 330   BMET:  Recent Labs    12/17/18 0342 12/18/18 0456  NA 136 138  K 3.8 3.8  CL 97* 102  CO2 27 30  GLUCOSE 149* 90  BUN 24* 21  CREATININE 0.87 0.87  CALCIUM 8.1* 8.3*    PT/INR: No results for input(s): LABPROT, INR in the last 72 hours. ABG    Component Value Date/Time   PHART 7.462 (H) 12/15/2018 1143   HCO3 30.0 (H) 12/15/2018 1143   O2SAT 95.5 12/15/2018 1143   CBG (last 3)  No results for input(s): GLUCAP in the last 72 hours.  Meds Scheduled Meds: . aspirin EC  81 mg Oral Daily  . bisacodyl  10 mg Oral Daily  . chlorhexidine  15 mL Mouth Rinse BID  . [START ON 12/20/2018] Chlorhexidine Gluconate Cloth  6 each Topical Daily  .  diltiazem  120 mg Oral Q12H  . enoxaparin (LOVENOX) injection  40 mg Subcutaneous Q24H  . levalbuterol  0.63 mg Nebulization TID  . loratadine  10 mg Oral Daily  . mouth rinse  15 mL Mouth Rinse q12n4p  . metoprolol succinate  50 mg Oral Daily  . pantoprazole  40 mg Oral Q1200  . senna-docusate  1 tablet Oral QHS   Continuous Infusions: . dextrose 5 % and 0.9% NaCl Stopped (12/18/18 0542)  . diltiazem (CARDIZEM) infusion Stopped (12/14/18 1520)  . piperacillin-tazobactam (ZOSYN)  IV 3.375 g (12/19/18 0700)  . potassium chloride    . vancomycin 1,500 mg (12/19/18 0039)   PRN Meds:.acetaminophen, alum & mag hydroxide-simeth, hydrALAZINE, levalbuterol, meclizine, ondansetron (ZOFRAN) IV, oxyCODONE, potassium chloride, traMADol  Xrays Dg Chest Port 1 View  Result Date: 12/19/2018 CLINICAL DATA:  Pneumothorax EXAM: PORTABLE CHEST 1 VIEW COMPARISON:  12/17/2018 FINDINGS: Pigtail chest tube on the right unchanged. Second straight chest tube on the right unchanged in position. No pneumothorax. Endobronchial valves on the right unchanged. Diffuse airspace disease right upper lobe and right lower lobe unchanged. Mild left lower lobe airspace disease unchanged. Underlying COPD. Bilateral subcutaneous emphysema has improved in the interval. Central line has been removed in the interval. IMPRESSION: 2 chest tubes on the right. No pneumothorax. Decreased subcutaneous emphysema bilaterally.  Diffuse airspace disease on the right is unchanged. Electronically Signed   By: Franchot Gallo M.D.   On: 12/19/2018 08:54   Dg Chest Port 1 View  Result Date: 12/18/2018 CLINICAL DATA:  Chest tube.  Pneumothorax. EXAM: PORTABLE CHEST 1 VIEW COMPARISON:  12/17/2018. FINDINGS: Right IJ line and 2 right chest tubes in stable position. No definite right apical pneumothorax noted on today's exam. Postsurgical changes noted about the chest. Heart size stable. Diffuse right lung infiltrate again noted without interim change.  No prominent pleural effusion. Diffuse bilateral neck and chest wall subcutaneous emphysema is again noted. IMPRESSION: 1. Right IJ line right chest tubes in stable position. No pneumothorax noted on today's exam. Diffuse bilateral neck and chest wall subcutaneous emphysema again noted. 2. Postsurgical changes over the chest again noted. Diffuse right lung infiltrate again noted. No interim change. Electronically Signed   By: Marcello Moores  Register   On: 12/18/2018 06:37    Assessment/Plan: S/P Procedure(s) (LRB): VIDEO BRONCHOSCOPY WITH INSERTION OF INTERBRONCHIAL VALVE (IBV) (N/A)  1 fairly stable with improving SQ air on CXR/clinically conts with persist air leak 2 conts current abx as outlined 3 aggressive pulm managemnet with steroids/nebs, PCCM assisting with management 4 cont diltiazem but reduce dose with some bradycardia 5 cont rehab as able   LOS: 10 days    Ravi Giovanni [facility] 12/19/2018 Pager [phone]   Air leak today appears small Chest tube drainage is minimal Cutaneous emphysema is noticeable Place chest tube to waterseal and follow-up x-ray tomorrow  patient examined and medical record reviewed,agree with above note. Tharon Aquas Trigt III 12/19/2018

## 2018-12-19 NOTE — Progress Notes (Signed)
Pharmacy Antibiotic Note  Randall Sullivan is a 68 y.o. male admitted on 12/09/2018 with pneumonia.  Pharmacy has been consulted for vancomycin and Zosyn dosing. Pt now to febrile with WBC downtrending, redo VATS needed at some point in future.  Renal function has remained stable.  Plan: -Zosyn 3.375g IV EI q8h -Continue vancomycin  1500mg  IV q12h -Continue for 7 days per Dr. Roxan Hockey, stop date 12/21/18  Height: 5\' 10"  (177.8 cm) Weight: 205 lb 11 oz (93.3 kg) IBW/kg (Calculated) : 73  Temp (24hrs), Avg:97.6 F (36.4 C), Min:97.5 F (36.4 C), Max:97.7 F (36.5 C)  Recent Labs  Lab 12/13/18 0246 12/14/18 0330 12/16/18 0308 12/16/18 1219 12/17/18 0342 12/18/18 0456  WBC 20.7* 14.2*  --   --  15.2* 11.6*  CREATININE 0.98  --  1.12  --  0.87 0.87  VANCOTROUGH  --   --   --  13*  --   --     Estimated Creatinine Clearance: 93.2 mL/min (by C-G formula based on SCr of 0.87 mg/dL).    Allergies  Allergen Reactions  . No Known Allergies     Antimicrobials this admission: Vancomycin 3/16 >> (3/23) Zosyn 3/16 >> (3/23)  Dose adjustments this admission: VT 13 on 3/18 - increase to 1500 mg IV every 12 hours  Microbiology results: 3/16 MRSA: neg 3/17 Bcx: NGTD   Thank you for allowing pharmacy to be a part of this patient's care.  Claiborne Billings, PharmD PGY2 Cardiology Pharmacy Resident Please check AMION for all Pharmacist numbers by unit 12/19/2018 11:52 AM

## 2018-12-20 ENCOUNTER — Inpatient Hospital Stay (HOSPITAL_COMMUNITY): Payer: Medicare Other

## 2018-12-20 LAB — CULTURE, BLOOD (ROUTINE X 2)
CULTURE: NO GROWTH
Culture: NO GROWTH
Special Requests: ADEQUATE

## 2018-12-20 LAB — BASIC METABOLIC PANEL WITH GFR
Anion gap: 8 (ref 5–15)
BUN: 20 mg/dL (ref 8–23)
CO2: 29 mmol/L (ref 22–32)
Calcium: 8.2 mg/dL — ABNORMAL LOW (ref 8.9–10.3)
Chloride: 101 mmol/L (ref 98–111)
Creatinine, Ser: 0.93 mg/dL (ref 0.61–1.24)
GFR calc Af Amer: >60 mL/min
GFR calc non Af Amer: >60 mL/min
Glucose, Bld: 111 mg/dL — ABNORMAL HIGH (ref 70–99)
Potassium: 4.3 mmol/L (ref 3.5–5.1)
Sodium: 138 mmol/L (ref 135–145)

## 2018-12-20 LAB — CBC
HCT: 37.2 % — ABNORMAL LOW (ref 39.0–52.0)
Hemoglobin: 11.1 g/dL — ABNORMAL LOW (ref 13.0–17.0)
MCH: 25.8 pg — ABNORMAL LOW (ref 26.0–34.0)
MCHC: 29.8 g/dL — ABNORMAL LOW (ref 30.0–36.0)
MCV: 86.3 fL (ref 80.0–100.0)
Platelets: 292 K/uL (ref 150–400)
RBC: 4.31 MIL/uL (ref 4.22–5.81)
RDW: 14.4 % (ref 11.5–15.5)
WBC: 13.3 K/uL — ABNORMAL HIGH (ref 4.0–10.5)
nRBC: 0 % (ref 0.0–0.2)

## 2018-12-20 MED ORDER — CLONIDINE HCL 0.1 MG PO TABS
0.1000 mg | ORAL_TABLET | Freq: Every day | ORAL | Status: DC
  Administered 2018-12-20 – 2019-01-06 (×18): 0.1 mg via ORAL
  Filled 2018-12-20 (×18): qty 1

## 2018-12-20 NOTE — Progress Notes (Addendum)
[address]       [place] [id number]             [phone]      9 Days Post-Op Procedure(s) (LRB): VIDEO BRONCHOSCOPY WITH INSERTION OF INTERBRONCHIAL VALVE (IBV) (N/A) Subjective: conts to have DOE, burning from reflux is better  Objective: Vital signs in last 24 hours: Temp:  [97.1 F (36.2 C)-97.7 F (36.5 C)] 97.1 F (36.2 C) (03/22 0804) Pulse Rate:  [48-70] 55 (03/22 1000) Cardiac Rhythm: Sinus bradycardia (03/22 0800) Resp:  [12-20] 12 (03/22 1000) BP: (131-181)/(75-95) 162/95 (03/22 0804) SpO2:  [95 %-99 %] 96 % (03/22 0922)  Hemodynamic parameters for last 24 hours:    Intake/Output from previous day: 03/21 0701 - 03/22 0700 In: 1320.2 [P.O.:720; IV Piggyback:600.2] Out: 755 [Urine:625; Chest Tube:130] Intake/Output this shift: Total I/O In: 240 [P.O.:240] Out: 200 [Urine:200]  General appearance: alert, cooperative and no distress Heart: regular rate and rhythm Lungs: decreased R>L lower fields Abdomen: benign Extremities: no edema or caldf tenderness Wound: incis healing well  Lab Results: Recent Labs    12/18/18 0456 12/20/18 0225  WBC 11.6* 13.3*  HGB 11.6* 11.1*  HCT 39.3 37.2*  PLT 330 292   BMET:  Recent Labs    12/18/18 0456 12/20/18 0225  NA 138 138  K 3.8 4.3  CL 102 101  CO2 30 29  GLUCOSE 90 111*  BUN 21 20  CREATININE 0.87 0.93  CALCIUM 8.3* 8.2*    PT/INR: No results for input(s): LABPROT, INR in the last 72 hours. ABG    Component Value Date/Time   PHART 7.462 (H) 12/15/2018 1143   HCO3 30.0 (H) 12/15/2018 1143   O2SAT 95.5 12/15/2018 1143   CBG (last 3)  No results for input(s): GLUCAP in the last 72 hours.  Meds Scheduled Meds: . aspirin EC  81 mg Oral Daily  . bisacodyl  10 mg Oral Daily  . chlorhexidine  15 mL Mouth Rinse BID  . diltiazem  60 mg Oral Q12H  . enoxaparin (LOVENOX) injection  40 mg Subcutaneous Q24H  . levalbuterol  0.63 mg Nebulization TID  . loratadine  10 mg  Oral Daily  . metoprolol succinate  50 mg Oral Daily  . pantoprazole  40 mg Oral BID  . senna-docusate  1 tablet Oral QHS   Continuous Infusions: . dextrose 5 % and 0.9% NaCl Stopped (12/18/18 0542)  . piperacillin-tazobactam (ZOSYN)  IV 3.375 g (12/20/18 0510)  . vancomycin 1,500 mg (12/20/18 0158)   PRN Meds:.acetaminophen, alum & mag hydroxide-simeth, hydrALAZINE, levalbuterol, meclizine, ondansetron (ZOFRAN) IV, oxyCODONE, traMADol  Xrays Dg Chest Port 1 View  Result Date: 12/20/2018 CLINICAL DATA:  Initial evaluation for chest tube management. EXAM: PORTABLE CHEST 1 VIEW COMPARISON:  Prior radiograph from 12/19/2018 FINDINGS: Stable cardiomegaly.  Mediastinal silhouette unchanged. Postsurgical changes seen within the right lung. Endobronchial valves again noted, stable. Pigtail chest tube along with a large-bore chest tube remain in place, tips overlying the peripheral right upper lobe. No visible pneumothorax. Parenchymal opacity involving the bilateral lung bases, right greater than left is relatively similar. No pulmonary edema. No significant pleural effusion. Extensive subcutaneous emphysema noted within the chest, right worse than left, mildly decreased. Osseous structures unchanged. IMPRESSION: 1. Stable position of right-sided chest tubes with no appreciable pneumothorax. 2. Persistent right greater than left bibasilar airspace disease, unchanged. 3. Slight interval decrease in diffuse subcutaneous emphysema. Electronically Signed   By: Pincus Badder.D.  On: 12/20/2018 06:50   Dg Chest Port 1 View  Result Date: 12/19/2018 CLINICAL DATA:  Pneumothorax EXAM: PORTABLE CHEST 1 VIEW COMPARISON:  12/17/2018 FINDINGS: Pigtail chest tube on the right unchanged. Second straight chest tube on the right unchanged in position. No pneumothorax. Endobronchial valves on the right unchanged. Diffuse airspace disease right upper lobe and right lower lobe unchanged. Mild left lower lobe  airspace disease unchanged. Underlying COPD. Bilateral subcutaneous emphysema has improved in the interval. Central line has been removed in the interval. IMPRESSION: 2 chest tubes on the right. No pneumothorax. Decreased subcutaneous emphysema bilaterally. Diffuse airspace disease on the right is unchanged. Electronically Signed   By: Franchot Gallo M.D.   On: 12/19/2018 08:54    Assessment/Plan: S/P Procedure(s) (LRB): VIDEO BRONCHOSCOPY WITH INSERTION OF INTERBRONCHIAL VALVE (IBV) (N/A)  1 conts to have air leak, about same as yesterday 2 BP up at times, not bradycardic anymore with reduced cardizem dose, will  Add low dose clonidine 3 conts current abx as outlined 4 cont pulm RX/TX  LOS: 11 days    Zyron Giovanni PA-C 12/20/2018 PAGER [phone]  2 column  me air leak from posterior tubes Anterior pigtail catheter without air leak Subcutaneous emphysema resolved Suction reduced to 20 cm H2O to both Pleur-evacs. Portable chest x-ray in a.m. and assessment by Dr. Koleen Nimrod for possible VATS.  Patient will be n.p.o. after midnight

## 2018-12-20 NOTE — Progress Notes (Signed)
Dr. Lucianne Lei trigt  was informed about  Anterior pigtail catheter came off, he said to watch vitals for next 2 hours for every half an hour, tight dressing provided on side, pt is stable, denies pain and pressure on side, vitals stable this point, pt's family member at bed side and is aware   Palma Holter, RN

## 2018-12-20 NOTE — Progress Notes (Signed)
Pt said he felt something popped up on his chest at 4pm when he was up the chair and ready to walk, RN assessed his chest tube site which was just changed dressing, and it was intact, when pt came back from walk and sit down, RN saw the tip of the Anterior pigtail catheter on the floor, RN paged Jadene Pierini PA, said no need to do chest x-ray this time, pt's vitals stable, denies pain, oxygen saturation is 95% with oxygen 4l, pt's wife is in bed side, aware of the situation, will continue to monitor  Palma Holter, RN

## 2018-12-21 ENCOUNTER — Inpatient Hospital Stay (HOSPITAL_COMMUNITY): Payer: Medicare Other | Admitting: Certified Registered Nurse Anesthetist

## 2018-12-21 ENCOUNTER — Encounter (HOSPITAL_COMMUNITY)
Admission: RE | Disposition: A | Discharge status: Home Health | Payer: Self-pay | Source: Home / Self Care | Attending: Thoracic Surgery (Cardiothoracic Vascular Surgery)

## 2018-12-21 ENCOUNTER — Encounter (HOSPITAL_COMMUNITY): Payer: Self-pay | Admitting: Orthopedic Surgery

## 2018-12-21 ENCOUNTER — Inpatient Hospital Stay (HOSPITAL_COMMUNITY): Payer: Medicare Other

## 2018-12-21 DIAGNOSIS — J95811 Postprocedural pneumothorax: Secondary | ICD-10-CM

## 2018-12-21 HISTORY — PX: VIDEO ASSISTED THORACOSCOPY: SHX5073

## 2018-12-21 HISTORY — PX: VIDEO BRONCHOSCOPY: SHX5072

## 2018-12-21 LAB — CBC
HCT: 42 % (ref 39.0–52.0)
Hemoglobin: 12.6 g/dL — ABNORMAL LOW (ref 13.0–17.0)
MCH: 25.6 pg — ABNORMAL LOW (ref 26.0–34.0)
MCHC: 30 g/dL (ref 30.0–36.0)
MCV: 85.4 fL (ref 80.0–100.0)
Platelets: 352 10*3/uL (ref 150–400)
RBC: 4.92 MIL/uL (ref 4.22–5.81)
RDW: 14.4 % (ref 11.5–15.5)
WBC: 14.1 10*3/uL — ABNORMAL HIGH (ref 4.0–10.5)
nRBC: 0 % (ref 0.0–0.2)

## 2018-12-21 LAB — TYPE AND SCREEN
ABO/RH(D): A POS
Antibody Screen: NEGATIVE

## 2018-12-21 SURGERY — VIDEO ASSISTED THORACOSCOPY
Anesthesia: General | Site: Chest | Laterality: Right

## 2018-12-21 MED ORDER — SODIUM CHLORIDE (PF) 0.9 % IJ SOLN
INTRAMUSCULAR | Status: DC | PRN
  Administered 2018-12-21: 40 mL via INTRAVENOUS

## 2018-12-21 MED ORDER — 0.9 % SODIUM CHLORIDE (POUR BTL) OPTIME
TOPICAL | Status: DC | PRN
  Administered 2018-12-21: 3000 mL

## 2018-12-21 MED ORDER — HEMOSTATIC AGENTS (NO CHARGE) OPTIME: TOPICAL | Status: DC | PRN

## 2018-12-21 MED ORDER — PROPOFOL 10 MG/ML IV BOLUS
INTRAVENOUS | Status: DC | PRN
  Administered 2018-12-21: 150 mg via INTRAVENOUS

## 2018-12-21 MED ORDER — BUPIVACAINE HCL (PF) 0.5 % IJ SOLN
INTRAMUSCULAR | Status: DC | PRN
  Administered 2018-12-21: 24 mL

## 2018-12-21 MED ORDER — DEXMEDETOMIDINE HCL IN NACL 200 MCG/50ML IV SOLN
INTRAVENOUS | Status: AC
  Filled 2018-12-21: qty 50

## 2018-12-21 MED ORDER — GLYCOPYRROLATE 0.2 MG/ML IJ SOLN
INTRAMUSCULAR | Status: DC | PRN
  Administered 2018-12-21: .2 mg via INTRAVENOUS

## 2018-12-21 MED ORDER — SODIUM CHLORIDE 0.9 % IV SOLN
INTRAVENOUS | Status: DC | PRN
  Administered 2018-12-21: 30 ug/min via INTRAVENOUS

## 2018-12-21 MED ORDER — DEXAMETHASONE SODIUM PHOSPHATE 10 MG/ML IJ SOLN
INTRAMUSCULAR | Status: AC
  Filled 2018-12-21: qty 1

## 2018-12-21 MED ORDER — ROCURONIUM BROMIDE 50 MG/5ML IV SOSY
PREFILLED_SYRINGE | INTRAVENOUS | Status: DC | PRN
  Administered 2018-12-21: 50 mg via INTRAVENOUS
  Administered 2018-12-21: 5 mg via INTRAVENOUS
  Administered 2018-12-21: 10 mg via INTRAVENOUS
  Administered 2018-12-21: 5 mg via INTRAVENOUS

## 2018-12-21 MED ORDER — ONDANSETRON HCL 4 MG/2ML IJ SOLN: 4.0000 mg | Freq: Four times a day (QID) | INTRAMUSCULAR | Status: DC | PRN

## 2018-12-21 MED ORDER — ONDANSETRON HCL 4 MG/2ML IJ SOLN
INTRAMUSCULAR | Status: DC | PRN
  Administered 2018-12-21: 4 mg via INTRAVENOUS

## 2018-12-21 MED ORDER — DEXAMETHASONE SODIUM PHOSPHATE 10 MG/ML IJ SOLN
INTRAMUSCULAR | Status: DC | PRN
  Administered 2018-12-21: 10 mg via INTRAVENOUS

## 2018-12-21 MED ORDER — BUPIVACAINE HCL (PF) 0.5 % IJ SOLN
INTRAMUSCULAR | Status: AC
  Filled 2018-12-21: qty 30

## 2018-12-21 MED ORDER — LIDOCAINE 2% (20 MG/ML) 5 ML SYRINGE
INTRAMUSCULAR | Status: DC | PRN
  Administered 2018-12-21: 80 mg via INTRAVENOUS

## 2018-12-21 MED ORDER — MIDAZOLAM HCL 2 MG/2ML IJ SOLN
INTRAMUSCULAR | Status: AC
  Filled 2018-12-21: qty 2

## 2018-12-21 MED ORDER — LACTATED RINGERS IV SOLN
INTRAVENOUS | Status: DC | PRN
  Administered 2018-12-21: 14:00:00 via INTRAVENOUS

## 2018-12-21 MED ORDER — DEXMEDETOMIDINE HCL IN NACL 200 MCG/50ML IV SOLN
0.4000 ug/kg/h | INTRAVENOUS | Status: DC
  Administered 2018-12-21: 1 ug/kg/h via INTRAVENOUS

## 2018-12-21 MED ORDER — GLYCOPYRROLATE PF 0.2 MG/ML IJ SOSY
PREFILLED_SYRINGE | INTRAMUSCULAR | Status: AC
  Filled 2018-12-21: qty 1

## 2018-12-21 MED ORDER — ACETAMINOPHEN 10 MG/ML IV SOLN
INTRAVENOUS | Status: DC | PRN
  Administered 2018-12-21: 1000 mg via INTRAVENOUS

## 2018-12-21 MED ORDER — DEXMEDETOMIDINE HCL IN NACL 200 MCG/50ML IV SOLN
INTRAVENOUS | Status: AC
  Filled 2018-12-21: qty 100

## 2018-12-21 MED ORDER — BUPIVACAINE LIPOSOME 1.3 % IJ SUSP
20.0000 mL | INTRAMUSCULAR | Status: AC
  Administered 2018-12-21: 16 mL
  Filled 2018-12-21: qty 20

## 2018-12-21 MED ORDER — SODIUM CHLORIDE 0.9 % IV SOLN: INTRAVENOUS | Status: DC | PRN

## 2018-12-21 MED ORDER — SUCCINYLCHOLINE CHLORIDE 200 MG/10ML IV SOSY
PREFILLED_SYRINGE | INTRAVENOUS | Status: DC | PRN
  Administered 2018-12-21: 100 mg via INTRAVENOUS

## 2018-12-21 MED ORDER — ROCURONIUM BROMIDE 50 MG/5ML IV SOSY
PREFILLED_SYRINGE | INTRAVENOUS | Status: AC
  Filled 2018-12-21: qty 5

## 2018-12-21 MED ORDER — DIPHENHYDRAMINE HCL 50 MG/ML IJ SOLN: 12.5000 mg | Freq: Four times a day (QID) | INTRAMUSCULAR | Status: DC | PRN

## 2018-12-21 MED ORDER — PROPOFOL 10 MG/ML IV BOLUS
INTRAVENOUS | Status: AC
  Filled 2018-12-21: qty 20

## 2018-12-21 MED ORDER — LACTATED RINGERS IV SOLN
INTRAVENOUS | Status: DC
  Administered 2018-12-21: 13:00:00 via INTRAVENOUS

## 2018-12-21 MED ORDER — FENTANYL CITRATE (PF) 250 MCG/5ML IJ SOLN
INTRAMUSCULAR | Status: AC
  Filled 2018-12-21: qty 5

## 2018-12-21 MED ORDER — PROPOFOL 1000 MG/100ML IV EMUL
INTRAVENOUS | Status: AC
  Filled 2018-12-21: qty 100

## 2018-12-21 MED ORDER — NALOXONE HCL 0.4 MG/ML IJ SOLN: 0.4000 mg | INTRAMUSCULAR | Status: DC | PRN

## 2018-12-21 MED ORDER — MORPHINE SULFATE 2 MG/ML IV SOLN
INTRAVENOUS | Status: DC
  Administered 2018-12-21: 2 mg via INTRAVENOUS
  Administered 2018-12-21: 1 mg via INTRAVENOUS
  Administered 2018-12-21 – 2018-12-22 (×2): 0 mg via INTRAVENOUS
  Filled 2018-12-21: qty 60

## 2018-12-21 MED ORDER — SUGAMMADEX SODIUM 200 MG/2ML IV SOLN
INTRAVENOUS | Status: DC | PRN
  Administered 2018-12-21: 200 mg via INTRAVENOUS

## 2018-12-21 MED ORDER — DIPHENHYDRAMINE HCL 12.5 MG/5ML PO ELIX: 12.5000 mg | ORAL_SOLUTION | Freq: Four times a day (QID) | ORAL | Status: DC | PRN

## 2018-12-21 MED ORDER — FENTANYL CITRATE (PF) 100 MCG/2ML IJ SOLN: 25.0000 ug | INTRAMUSCULAR | Status: DC | PRN

## 2018-12-21 MED ORDER — ACETAMINOPHEN 10 MG/ML IV SOLN
INTRAVENOUS | Status: AC
  Filled 2018-12-21: qty 100

## 2018-12-21 MED ORDER — ONDANSETRON HCL 4 MG/2ML IJ SOLN: 4.0000 mg | Freq: Once | INTRAMUSCULAR | Status: DC | PRN

## 2018-12-21 MED ORDER — ARTIFICIAL TEARS OPHTHALMIC OINT
TOPICAL_OINTMENT | OPHTHALMIC | Status: AC
  Filled 2018-12-21: qty 3.5

## 2018-12-21 MED ORDER — ONDANSETRON HCL 4 MG/2ML IJ SOLN
INTRAMUSCULAR | Status: AC
  Filled 2018-12-21: qty 2

## 2018-12-21 MED ORDER — FENTANYL CITRATE (PF) 250 MCG/5ML IJ SOLN
INTRAMUSCULAR | Status: DC | PRN
  Administered 2018-12-21: 50 ug via INTRAVENOUS
  Administered 2018-12-21: 25 ug via INTRAVENOUS

## 2018-12-21 MED ORDER — LIDOCAINE 2% (20 MG/ML) 5 ML SYRINGE
INTRAMUSCULAR | Status: AC
  Filled 2018-12-21: qty 5

## 2018-12-21 MED ORDER — SODIUM CHLORIDE 0.9% FLUSH: 9.0000 mL | INTRAVENOUS | Status: DC | PRN

## 2018-12-21 SURGICAL SUPPLY — 92 items
APPLICATOR COTTON TIP 6 STRL (MISCELLANEOUS) IMPLANT
APPLICATOR COTTON TIP 6IN STRL (MISCELLANEOUS) IMPLANT
APPLIER CLIP ROT 10 11.4 M/L (STAPLE)
CANISTER SUCT 3000ML PPV (MISCELLANEOUS) ×4 IMPLANT
CATH THORACIC 28FR (CATHETERS) ×2 IMPLANT
CATH THORACIC 28FR RT ANG (CATHETERS) ×2 IMPLANT
CATH THORACIC 36FR (CATHETERS) IMPLANT
CATH THORACIC 36FR RT ANG (CATHETERS) IMPLANT
CLIP APPLIE ROT 10 11.4 M/L (STAPLE) IMPLANT
CLIP VESOCCLUDE MED 6/CT (CLIP) IMPLANT
CONN Y 3/8X3/8X3/8  BEN (MISCELLANEOUS) ×2
CONN Y 3/8X3/8X3/8 BEN (MISCELLANEOUS) ×2 IMPLANT
CONT SPEC 4OZ CLIKSEAL STRL BL (MISCELLANEOUS) ×12 IMPLANT
COVER SURGICAL LIGHT HANDLE (MISCELLANEOUS) ×4 IMPLANT
COVER WAND RF STERILE (DRAPES) ×4 IMPLANT
DERMABOND ADVANCED (GAUZE/BANDAGES/DRESSINGS) ×2
DERMABOND ADVANCED .7 DNX12 (GAUZE/BANDAGES/DRESSINGS) IMPLANT
DRAIN CHANNEL 28F RND 3/8 FF (WOUND CARE) IMPLANT
DRAIN CHANNEL 32F RND 10.7 FF (WOUND CARE) IMPLANT
DRAPE LAPAROSCOPIC ABDOMINAL (DRAPES) ×4 IMPLANT
DRAPE SLUSH/WARMER DISC (DRAPES) ×4 IMPLANT
ELECT BLADE 6.5 EXT (BLADE) ×2 IMPLANT
ELECT REM PT RETURN 9FT ADLT (ELECTROSURGICAL) ×4
ELECTRODE REM PT RTRN 9FT ADLT (ELECTROSURGICAL) ×2 IMPLANT
FORCEPS RADIAL JAW LRG 4 PULM (INSTRUMENTS) IMPLANT
GAUZE SPONGE 4X4 12PLY STRL (GAUZE/BANDAGES/DRESSINGS) ×4 IMPLANT
GAUZE SPONGE 4X4 12PLY STRL LF (GAUZE/BANDAGES/DRESSINGS) ×2 IMPLANT
GAUZE VASELINE 3X9 (GAUZE/BANDAGES/DRESSINGS) ×2 IMPLANT
GAUZE XEROFORM 1X8 LF (GAUZE/BANDAGES/DRESSINGS) ×2 IMPLANT
GLOVE SURG SIGNA 7.5 PF LTX (GLOVE) ×8 IMPLANT
GOWN STRL REUS W/ TWL LRG LVL3 (GOWN DISPOSABLE) ×4 IMPLANT
GOWN STRL REUS W/ TWL XL LVL3 (GOWN DISPOSABLE) ×2 IMPLANT
GOWN STRL REUS W/TWL LRG LVL3 (GOWN DISPOSABLE) ×4
GOWN STRL REUS W/TWL XL LVL3 (GOWN DISPOSABLE) ×2
HANDLE STAPLE ENDO GIA SHORT (STAPLE) ×2
HEMOSTAT SURGICEL 2X14 (HEMOSTASIS) IMPLANT
IV CATH 22GX1 FEP (IV SOLUTION) IMPLANT
KIT BASIN OR (CUSTOM PROCEDURE TRAY) ×4 IMPLANT
KIT SUCTION CATH 14FR (SUCTIONS) ×4 IMPLANT
KIT TURNOVER KIT B (KITS) ×4 IMPLANT
NDL HYPO 25GX1X1/2 BEV (NEEDLE) IMPLANT
NDL SPNL 18GX3.5 QUINCKE PK (NEEDLE) ×2 IMPLANT
NEEDLE HYPO 25GX1X1/2 BEV (NEEDLE) ×4 IMPLANT
NEEDLE SPNL 18GX3.5 QUINCKE PK (NEEDLE) ×4 IMPLANT
NS IRRIG 1000ML POUR BTL (IV SOLUTION) ×10 IMPLANT
OIL SILICONE PENTAX (PARTS (SERVICE/REPAIRS)) ×2 IMPLANT
PACK CHEST (CUSTOM PROCEDURE TRAY) ×4 IMPLANT
PAD ARMBOARD 7.5X6 YLW CONV (MISCELLANEOUS) ×8 IMPLANT
POUCH ENDO CATCH II 15MM (MISCELLANEOUS) IMPLANT
POUCH SPECIMEN RETRIEVAL 10MM (ENDOMECHANICALS) IMPLANT
PROGEL SPRAY TIP 11IN (MISCELLANEOUS) ×4
RADIAL JAW LRG 4 PULMONARY (INSTRUMENTS) ×2
RELOAD STAPLE 45 PURP MED/THCK (STAPLE) IMPLANT
RELOAD TRI 45 ART MED THCK BLK (STAPLE) ×2 IMPLANT
RELOAD TRI 45 ART MED THCK PUR (STAPLE) ×4 IMPLANT
SEALANT SURG COSEAL 4ML (VASCULAR PRODUCTS) IMPLANT
SEALANT SURG COSEAL 8ML (VASCULAR PRODUCTS) IMPLANT
SOLUTION ANTI FOG 6CC (MISCELLANEOUS) ×4 IMPLANT
SPECIMEN JAR MEDIUM (MISCELLANEOUS) ×4 IMPLANT
SPONGE INTESTINAL PEANUT (DISPOSABLE) IMPLANT
SPONGE TONSIL TAPE 1 RFD (DISPOSABLE) ×4 IMPLANT
STAPLER ENDO GIA 12 SHRT THIN (STAPLE) IMPLANT
STAPLER ENDO GIA 12MM SHORT (STAPLE) ×2 IMPLANT
SUT PROLENE 4 0 RB 1 (SUTURE) ×2
SUT PROLENE 4-0 RB1 .5 CRCL 36 (SUTURE) IMPLANT
SUT SILK  1 MH (SUTURE) ×4
SUT SILK 1 MH (SUTURE) ×4 IMPLANT
SUT SILK 2 0SH CR/8 30 (SUTURE) IMPLANT
SUT SILK 3 0SH CR/8 30 (SUTURE) IMPLANT
SUT VIC AB 1 CTX 36 (SUTURE) ×2
SUT VIC AB 1 CTX36XBRD ANBCTR (SUTURE) IMPLANT
SUT VIC AB 2-0 CTX 36 (SUTURE) IMPLANT
SUT VIC AB 2-0 UR6 27 (SUTURE) ×2 IMPLANT
SUT VIC AB 3-0 MH 27 (SUTURE) IMPLANT
SUT VIC AB 3-0 X1 27 (SUTURE) ×4 IMPLANT
SUT VICRYL 2 TP 1 (SUTURE) IMPLANT
SYR 20CC LL (SYRINGE) IMPLANT
SYR 30ML LL (SYRINGE) ×2 IMPLANT
SYRINGE 20CC LL (MISCELLANEOUS) ×4 IMPLANT
SYSTEM SAHARA CHEST DRAIN ATS (WOUND CARE) ×4 IMPLANT
TAPE CLOTH 4X10 WHT NS (GAUZE/BANDAGES/DRESSINGS) ×4 IMPLANT
TAPE CLOTH SURG 4X10 WHT LF (GAUZE/BANDAGES/DRESSINGS) ×2 IMPLANT
TIP APPLICATOR SPRAY EXTEND 16 (VASCULAR PRODUCTS) ×2 IMPLANT
TIP SPRAY PROGEL 11IN (MISCELLANEOUS) IMPLANT
TOWEL GREEN STERILE (TOWEL DISPOSABLE) ×4 IMPLANT
TOWEL GREEN STERILE FF (TOWEL DISPOSABLE) ×4 IMPLANT
TRAY FOLEY MTR SLVR 16FR STAT (SET/KITS/TRAYS/PACK) ×4 IMPLANT
TROCAR XCEL BLADELESS 5X75MML (TROCAR) ×6 IMPLANT
TROCAR XCEL NON-BLD 5MMX100MML (ENDOMECHANICALS) IMPLANT
VALVE BIOPSY  SINGLE USE (MISCELLANEOUS) ×4
VALVE BIOPSY SINGLE USE (MISCELLANEOUS) IMPLANT
WATER STERILE IRR 1000ML POUR (IV SOLUTION) ×8 IMPLANT

## 2018-12-21 NOTE — Anesthesia Procedure Notes (Signed)
Procedure Name: Intubation Date/Time: 12/21/2018 1:46 PM Performed by: Cleda Daub, CRNA Pre-anesthesia Checklist: Patient identified, Emergency Drugs available, Suction available and Patient being monitored Patient Re-evaluated:Patient Re-evaluated prior to induction Oxygen Delivery Method: Circle system utilized Preoxygenation: Pre-oxygenation with 100% oxygen Induction Type: IV induction Laryngoscope Size: Mac and 4 Grade View: Grade I Tube type: Oral Endobronchial tube: Double lumen EBT, Left, EBT position confirmed by auscultation and EBT position confirmed by fiberoptic bronchoscope and 39 Fr Number of attempts: 1 Airway Equipment and Method: Stylet and Fiberoptic brochoscope Placement Confirmation: ETT inserted through vocal cords under direct vision,  positive ETCO2 and breath sounds checked- equal and bilateral Tube secured with: Tape Dental Injury: Teeth and Oropharynx as per pre-operative assessment

## 2018-12-21 NOTE — Progress Notes (Signed)
      [address]       [place] [address]             [phone]       Mr. Insalaco still has a large air leak from pleural tube. Small pneumothorax on CXR this AM Will need to return to OR for redo Right VATS for repair of  Air leak He is aware of the indications, risks, benefits and alternatives  Remo Lipps C. Roxan Hockey, MD [facility] [phone]

## 2018-12-21 NOTE — Op Note (Signed)
NAME: KYO, COCUZZA MEDICAL RECORD IW:[id number] ACCOUNT [id number] DATE OF BIRTH:1950-12-06 FACILITY: [facility] LOCATION: [facility] PHYSICIAN:STEVEN Chaya Jan, MD  OPERATIVE REPORT  DATE OF PROCEDURE:  12/21/2018  PREOPERATIVE DIAGNOSIS:  Prolonged air leak after video-assisted thoracoscopic surgery.     POSTOPERATIVE DIAGNOSIS:  Ruptured bleb superior segment right lower lobe.  PROCEDURE:   Redo right video-assisted thoracoscopy, Stapling of bleb superior segment right lower lobe, and Bronchoscopy.  SURGEON:  Modesto Charon, MD  ASSISTANT:  Ellwood Handler, PA  ANESTHESIA:  General.  FINDINGS:  Ruptured bleb in superior segment right lower lobe.  No air leak demonstrable after stapling of bleb.  Bronchoscopy revealed valve in posterior segmental bronchus of the right upper lobe was not seated well.  The remaining valves were well  seated.  CLINICAL NOTE:  The patient is a 68 year old gentleman who had recently undergone right VATS for wedge resection and blebectomy.  Postoperatively, he developed a severe air leak.  Bronchial valves were placed.  The air leak improved initially but then worsened again.  He was advised to undergo a redo right video-assisted thoracoscopy for an attempt to repair the air leak.  The indications, risks, benefits, and alternatives were discussed in detail with the patient.  He understood and accepted the  risks and agreed to proceed.  OPERATIVE NOTE:  Mr. Lippmann was brought to the operating room on 12/21/2018.  He had induction of general anesthesia and was intubated with a double-lumen endotracheal tube.  He was placed in a left lateral decubitus position.  Single-lung ventilation  of the left lung was initiated.  The chest tube was removed.  The right chest was prepped and draped in the usual sterile fashion.  The patient intermittently required ventilation of the right lung due to desaturation with single-lung ventilation.  A timeout was performed.   The previous incision was reopened and was extended.  A port was placed through the previous chest tube site.  The thoracoscope was advanced into the chest.  Inspection of the upper lobe revealed the staple lines appeared well  healed.  There was no evidence of any disruption at or near the staple lines.  The chest was filled with saline, the right lung was opened to the ventilator, and an air leak was seen coming superiorly.  There were some mild adhesions to the chest wall,  which were taken down easily with blunt dissection.  Inspection revealed the air leakage was coming from a ruptured bleb at the tip of the superior segment of the right lower lobe.  This was at a relatively difficult angle to access initially, and a port  incision was made posteriorly, but that did not provide a better angle.  Ultimately, we were able to get a correct angle on the bleb, and it was resected with firings of the Covidien stapler using 45 mm black and purple cartridges with pericardial  reinforcement.  The chest then was copiously irrigated with warm saline.  A test inflation to 30 cm of water revealed no evidence of any air leakage.  A 28-French chest tube was placed through the original port incision and secured to the skin with a #1  silk suture.  Intercostal nerve blocks were performed from the third to the ninth interspace, injecting a solution containing a liposomal bupivacaine, 0.5% bupivacaine, and saline.  This solution was also injected into the incisions locally.  The posterior port incision was closed with a 3-0 Vicryl subcuticular suture.  The working incision was closed with a #1 Vicryl  fascial suture, 2-0 Vicryl subcutaneous suture, and a 3-0 Vicryl subcuticular suture.  The chest tube was placed to suction.   The patient was placed back in the supine position.  The double-lumen tube was changed for a single-lumen tube.  Flexible fiberoptic bronchoscopy was performed via the endotracheal tube.  There was  some thick mucus in the right side, primarily around the  valve.  This was irrigated with saline and cleared.  The valve in the superior segmental bronchus was well seated, as were the anterior and apical segmental valves in the right upper lobe.  The valve in the posterior aspect of the posterior segmental  bronchus of the right upper lobe was not well seated.  The stent was grasped with a biopsy forceps, and after several attempts it was in a more favorable position.  The bronchoscope was withdrawn.  The patient was extubated in the operating room and  taken to the postanesthetic care unit in good condition.  LN/NUANCE  D:12/21/2018 T:12/21/2018 JOB:[id number]

## 2018-12-21 NOTE — Anesthesia Preprocedure Evaluation (Signed)
Anesthesia Evaluation  Patient identified by MRN, date of birth, ID band Patient awake    Reviewed: Allergy & Precautions, NPO status , Patient's Chart, lab work & pertinent test results  History of Anesthesia Complications (+) history of anesthetic complications  Airway Mallampati: III  TM Distance: >3 FB Neck ROM: Full   Comment: Facial edema Dental  (+) Dental Advisory Given   Pulmonary COPD, former smoker,  S/p VATS with air leak and subq emphysema.   Pulmonary exam normal breath sounds clear to auscultation       Cardiovascular hypertension, Pt. on home beta blockers and Pt. on medications Normal cardiovascular exam Rhythm:Regular Rate:Normal     Neuro/Psych negative neurological ROS     GI/Hepatic negative GI ROS, Neg liver ROS,   Endo/Other  negative endocrine ROS  Renal/GU negative Renal ROS     Musculoskeletal  (+) Arthritis ,   Abdominal   Peds  Hematology negative hematology ROS (+)   Anesthesia Other Findings   Reproductive/Obstetrics                             Lab Results  Component Value Date   WBC 14.1 (H) 12/21/2018   HGB 12.6 (L) 12/21/2018   HCT 42.0 12/21/2018   MCV 85.4 12/21/2018   PLT 352 12/21/2018   Lab Results  Component Value Date   CREATININE 0.93 12/20/2018   BUN 20 12/20/2018   NA 138 12/20/2018   K 4.3 12/20/2018   CL 101 12/20/2018   CO2 29 12/20/2018                                   Anesthesia Physical  Anesthesia Plan  ASA: III  Anesthesia Plan: General   Post-op Pain Management:    Induction: Intravenous  PONV Risk Score and Plan: 3 and Ondansetron, Dexamethasone and Treatment may vary due to age or medical condition  Airway Management Planned: Oral ETT  Additional Equipment:   Intra-op Plan:   Post-operative Plan: Extubation in OR and Possible Post-op intubation/ventilation  Informed Consent: I have reviewed the  patients History and Physical, chart, labs and discussed the procedure including the risks, benefits and alternatives for the proposed anesthesia with the patient or authorized representative who has indicated his/her understanding and acceptance.     Dental advisory given  Plan Discussed with: CRNA  Anesthesia Plan Comments:         Anesthesia Quick Evaluation

## 2018-12-21 NOTE — Interval H&P Note (Signed)
History and Physical Interval Note:  12/21/2018 1:18 PM  Randall Sullivan  has presented today for surgery, with the diagnosis of AIRLEAK.  The various methods of treatment have been discussed with the patient and family. After consideration of risks, benefits and other options for treatment, the patient has consented to  Procedure(s): VIDEO ASSISTED THORACOSCOPY/REPAIR AIR LEAK (Right) as a surgical intervention.  The patient's history has been reviewed, patient examined, no change in status, stable for surgery.  I have reviewed the patient's chart and labs.  Questions were answered to the patient's satisfaction.     [name]

## 2018-12-21 NOTE — Plan of Care (Signed)
  Problem: Health Behavior/Discharge Planning: Goal: Ability to manage health-related needs will improve Outcome: Progressing   Problem: Clinical Measurements: Goal: Ability to maintain clinical measurements within normal limits will improve Outcome: Progressing Goal: Will remain free from infection Outcome: Progressing Goal: Diagnostic test results will improve Outcome: Progressing Goal: Respiratory complications will improve Outcome: Progressing Goal: Cardiovascular complication will be avoided Outcome: Progressing   Problem: Nutrition: Goal: Adequate nutrition will be maintained Outcome: Progressing   Problem: Coping: Goal: Level of anxiety will decrease Outcome: Progressing   Problem: Pain Managment: Goal: General experience of comfort will improve Outcome: Progressing   Problem: Safety: Goal: Ability to remain free from injury will improve Outcome: Progressing   Problem: Skin Integrity: Goal: Risk for impaired skin integrity will decrease Outcome: Progressing   Problem: Education: Goal: Knowledge of disease or condition will improve Outcome: Progressing Goal: Knowledge of the prescribed therapeutic regimen will improve Outcome: Progressing   Problem: Activity: Goal: Risk for activity intolerance will decrease Outcome: Progressing   Problem: Cardiac: Goal: Will achieve and/or maintain hemodynamic stability Outcome: Progressing   Problem: Clinical Measurements: Goal: Postoperative complications will be avoided or minimized Outcome: Progressing   Problem: Respiratory: Goal: Respiratory status will improve Outcome: Progressing   Problem: Pain Management: Goal: Pain level will decrease Outcome: Progressing   Problem: Skin Integrity: Goal: Wound healing without signs and symptoms infection will improve Outcome: Progressing

## 2018-12-21 NOTE — Transfer of Care (Signed)
Immediate Anesthesia Transfer of Care Note  Patient: Randall Sullivan  Procedure(s) Performed: VIDEO ASSISTED THORACOSCOPY AND RESECTION OF BLEBS RIGHT LOWER LOBE (Right Chest) VIDEO BRONCHOSCOPY (Bilateral Chest)  Patient Location: PACU  Anesthesia Type:General  Level of Consciousness: awake and pateint uncooperative  Airway & Oxygen Therapy: Patient Spontanous Breathing and Patient connected to face mask oxygen  Post-op Assessment: Report given to RN and Post -op Vital signs reviewed and stable  Post vital signs: Reviewed and stable  Last Vitals:  Vitals Value Taken Time  BP 157/86 12/21/2018  4:30 PM  Temp 36.2 C 12/21/2018  4:30 PM  Pulse 83 12/21/2018  4:39 PM  Resp 23 12/21/2018  4:39 PM  SpO2 91 % 12/21/2018  4:39 PM  Vitals shown include unvalidated device data.  Last Pain:  Vitals:   12/21/18 1147  TempSrc: Oral  PainSc: 0-No pain      Patients Stated Pain Goal: 0 (13/88/71 9597)  Complications: No apparent anesthesia complications

## 2018-12-21 NOTE — Brief Op Note (Signed)
12/09/2018 - 12/21/2018  7:19 AM  PATIENT:  Randall Sullivan  68 y.o. male  PRE-OPERATIVE DIAGNOSIS:  AIRLEAK  POST-OPERATIVE DIAGNOSIS:  Airleak  PROCEDURE:  Procedure(s):  VIDEO ASSISTED THORACOSCOPY -Blebectomy, Superior Segmentectomy RML  SURGEON:  Surgeon(s) and Role:    * [name], MD - Primary  PHYSICIAN ASSISTANT: Ellwood Handler PA-C  ANESTHESIA:   general  EBL:  50 mL   BLOOD ADMINISTERED:none  DRAINS: 28 Straight Chest Tube   LOCAL MEDICATIONS USED:  BUPIVICAINE   SPECIMEN:  Source of Specimen:  Bleb  DISPOSITION OF SPECIMEN:  PATHOLOGY  COUNTS:  YES  TOURNIQUET:  * No tourniquets in log *  DICTATION: .Dragon Dictation  PLAN OF CARE: patient has active admission order  PATIENT DISPOSITION:  PACU - hemodynamically stable.   Delay start of Pharmacological VTE agent (>24hrs) due to surgical blood loss or risk of bleeding: no

## 2018-12-22 ENCOUNTER — Encounter (HOSPITAL_COMMUNITY): Payer: Self-pay | Admitting: Thoracic Surgery (Cardiothoracic Vascular Surgery)

## 2018-12-22 ENCOUNTER — Inpatient Hospital Stay (HOSPITAL_COMMUNITY): Payer: Medicare Other

## 2018-12-22 MED ORDER — MORPHINE 100MG IN NS 100ML (1MG/ML) PREMIX INFUSION: 10.0000 mg/h | INTRAVENOUS | Status: DC

## 2018-12-22 NOTE — Progress Notes (Signed)
1 Day Post-Op Procedure(s) (LRB): VIDEO ASSISTED THORACOSCOPY AND RESECTION OF BLEBS RIGHT LOWER LOBE (Right) VIDEO BRONCHOSCOPY (Bilateral) Subjective: Some soreness  Objective: Vital signs in last 24 hours: Temp:  [97.2 F (36.2 C)-98.3 F (36.8 C)] 98.3 F (36.8 C) (03/24 0738) Pulse Rate:  [47-83] 63 (03/24 0700) Cardiac Rhythm: Normal sinus rhythm (03/24 0745) Resp:  [10-30] 24 (03/24 0700) BP: (103-161)/(62-96) 130/75 (03/24 0600) SpO2:  [94 %-100 %] 94 % (03/24 0700) Weight:  [93.3 kg-93.7 kg] 93.7 kg (03/24 0600)  Hemodynamic parameters for last 24 hours:    Intake/Output from previous day: 03/23 0701 - 03/24 0700 In: 1117.2 [P.O.:60; I.V.:1007.2; IV Piggyback:50] Out: 2075 [JOACZ:6606; Blood:50; Chest Tube:100] Intake/Output this shift: No intake/output data recorded.  General appearance: alert, cooperative and no distress Neurologic: intact Heart: regular rate and rhythm Lungs: diminished breath sounds bilaterally small air leak  Lab Results: Recent Labs    12/20/18 0225 12/21/18 0202  WBC 13.3* 14.1*  HGB 11.1* 12.6*  HCT 37.2* 42.0  PLT 292 352   BMET:  Recent Labs    12/20/18 0225  NA 138  K 4.3  CL 101  CO2 29  GLUCOSE 111*  BUN 20  CREATININE 0.93  CALCIUM 8.2*    PT/INR: No results for input(s): LABPROT, INR in the last 72 hours. ABG    Component Value Date/Time   PHART 7.462 (H) 12/15/2018 1143   HCO3 30.0 (H) 12/15/2018 1143   O2SAT 95.5 12/15/2018 1143   CBG (last 3)  No results for input(s): GLUCAP in the last 72 hours.  Assessment/Plan: S/P Procedure(s) (LRB): VIDEO ASSISTED THORACOSCOPY AND RESECTION OF BLEBS RIGHT LOWER LOBE (Right) VIDEO BRONCHOSCOPY (Bilateral) -POD # 1 required precedex initially due to agitation postop  Now off and appropriate Air leak significantly reduced postop- will keep on suction today Mobilize Atrial fib- in Sr/ SB on diltiazem Completed 7 day course of Vanco and Zosyn for hospital  acquired pneumonia Completed Tamiflu for influenza A Transfer to progressive   LOS: 13 days    [name] 12/22/2018

## 2018-12-22 NOTE — Plan of Care (Signed)
  Problem: Health Behavior/Discharge Planning: Goal: Ability to manage health-related needs will improve Outcome: Progressing   Problem: Clinical Measurements: Goal: Ability to maintain clinical measurements within normal limits will improve Outcome: Progressing Goal: Will remain free from infection Outcome: Progressing Goal: Diagnostic test results will improve Outcome: Progressing Goal: Respiratory complications will improve Outcome: Progressing Goal: Cardiovascular complication will be avoided Outcome: Progressing   Problem: Nutrition: Goal: Adequate nutrition will be maintained Outcome: Progressing   Problem: Coping: Goal: Level of anxiety will decrease Outcome: Progressing   Problem: Pain Managment: Goal: General experience of comfort will improve Outcome: Progressing   Problem: Safety: Goal: Ability to remain free from injury will improve Outcome: Progressing   Problem: Skin Integrity: Goal: Risk for impaired skin integrity will decrease Outcome: Progressing   Problem: Education: Goal: Knowledge of disease or condition will improve Outcome: Progressing Goal: Knowledge of the prescribed therapeutic regimen will improve Outcome: Progressing   Problem: Activity: Goal: Risk for activity intolerance will decrease Outcome: Progressing   Problem: Cardiac: Goal: Will achieve and/or maintain hemodynamic stability Outcome: Progressing   Problem: Clinical Measurements: Goal: Postoperative complications will be avoided or minimized Outcome: Progressing   Problem: Pain Management: Goal: Pain level will decrease Outcome: Progressing   Problem: Skin Integrity: Goal: Wound healing without signs and symptoms infection will improve Outcome: Progressing

## 2018-12-22 NOTE — Progress Notes (Signed)
This RN wasted approximately 20cc remaining in the morphine PCA in the appropriate disposal location with Benard Rink RN.

## 2018-12-23 ENCOUNTER — Inpatient Hospital Stay (HOSPITAL_COMMUNITY): Payer: Medicare Other

## 2018-12-23 MED ORDER — POLYETHYLENE GLYCOL 3350 17 G PO PACK
17.0000 g | PACK | Freq: Every day | ORAL | Status: DC
  Administered 2018-12-23 – 2018-12-26 (×4): 17 g via ORAL
  Filled 2018-12-23 (×12): qty 1

## 2018-12-23 NOTE — Plan of Care (Signed)
  Problem: Health Behavior/Discharge Planning: Goal: Ability to manage health-related needs will improve Outcome: Progressing   Problem: Clinical Measurements: Goal: Ability to maintain clinical measurements within normal limits will improve Outcome: Progressing Goal: Will remain free from infection Outcome: Progressing Goal: Diagnostic test results will improve Outcome: Progressing Goal: Respiratory complications will improve Outcome: Progressing Goal: Cardiovascular complication will be avoided Outcome: Progressing   Problem: Nutrition: Goal: Adequate nutrition will be maintained Outcome: Progressing   Problem: Coping: Goal: Level of anxiety will decrease Outcome: Progressing   Problem: Pain Managment: Goal: General experience of comfort will improve Outcome: Progressing   Problem: Safety: Goal: Ability to remain free from injury will improve Outcome: Progressing   Problem: Skin Integrity: Goal: Risk for impaired skin integrity will decrease Outcome: Progressing   Problem: Education: Goal: Knowledge of disease or condition will improve Outcome: Progressing Goal: Knowledge of the prescribed therapeutic regimen will improve Outcome: Progressing   Problem: Activity: Goal: Risk for activity intolerance will decrease Outcome: Progressing   Problem: Cardiac: Goal: Will achieve and/or maintain hemodynamic stability Outcome: Progressing   Problem: Clinical Measurements: Goal: Postoperative complications will be avoided or minimized Outcome: Progressing   Problem: Respiratory: Goal: Respiratory status will improve Outcome: Progressing   Problem: Pain Management: Goal: Pain level will decrease Outcome: Progressing   Problem: Skin Integrity: Goal: Wound healing without signs and symptoms infection will improve Outcome: Progressing

## 2018-12-23 NOTE — Progress Notes (Signed)
2 Days Post-Op Procedure(s) (LRB): VIDEO ASSISTED THORACOSCOPY AND RESECTION OF BLEBS RIGHT LOWER LOBE (Right) VIDEO BRONCHOSCOPY (Bilateral) Subjective: Some pain at chest tube site  Objective: Vital signs in last 24 hours: Temp:  [97.4 F (36.3 C)-98.4 F (36.9 C)] 97.5 F (36.4 C) (03/25 0407) Pulse Rate:  [57-78] 58 (03/25 0407) Cardiac Rhythm: Normal sinus rhythm (03/25 0400) Resp:  [16-25] 16 (03/25 0407) BP: (125-144)/(70-97) 131/81 (03/25 0407) SpO2:  [94 %-100 %] 97 % (03/25 0407)  Hemodynamic parameters for last 24 hours:    Intake/Output from previous day: 03/24 0701 - 03/25 0700 In: 709.4 [P.O.:680; I.V.:29.4] Out: 1050 [Urine:1000; Chest Tube:50] Intake/Output this shift: No intake/output data recorded.  General appearance: alert, cooperative and no distress Neurologic: intact Heart: regular rate and rhythm Lungs: diminished breath sounds bilaterally minimal air leak  Lab Results: Recent Labs    12/21/18 0202  WBC 14.1*  HGB 12.6*  HCT 42.0  PLT 352   BMET: No results for input(s): NA, K, CL, CO2, GLUCOSE, BUN, CREATININE, CALCIUM in the last 72 hours.  PT/INR: No results for input(s): LABPROT, INR in the last 72 hours. ABG    Component Value Date/Time   PHART 7.462 (H) 12/15/2018 1143   HCO3 30.0 (H) 12/15/2018 1143   O2SAT 95.5 12/15/2018 1143   CBG (last 3)  No results for input(s): GLUCAP in the last 72 hours.  Assessment/Plan: S/P Procedure(s) (LRB): VIDEO ASSISTED THORACOSCOPY AND RESECTION OF BLEBS RIGHT LOWER LOBE (Right) VIDEO BRONCHOSCOPY (Bilateral) - Looks good Has minimal air leak - will keep on suction today Continue to mobilize In SR on metoprolol and diltiazem Afebrile off antibiotics   LOS: 14 days    Randall Sullivan 12/23/2018

## 2018-12-24 ENCOUNTER — Inpatient Hospital Stay (HOSPITAL_COMMUNITY): Payer: Medicare Other

## 2018-12-24 LAB — CBC
HCT: 38.8 % — ABNORMAL LOW (ref 39.0–52.0)
Hemoglobin: 11.6 g/dL — ABNORMAL LOW (ref 13.0–17.0)
MCH: 25.5 pg — ABNORMAL LOW (ref 26.0–34.0)
MCHC: 29.9 g/dL — ABNORMAL LOW (ref 30.0–36.0)
MCV: 85.3 fL (ref 80.0–100.0)
Platelets: 274 10*3/uL (ref 150–400)
RBC: 4.55 MIL/uL (ref 4.22–5.81)
RDW: 14.8 % (ref 11.5–15.5)
WBC: 14.3 10*3/uL — ABNORMAL HIGH (ref 4.0–10.5)
nRBC: 0 % (ref 0.0–0.2)

## 2018-12-24 LAB — BASIC METABOLIC PANEL
Anion gap: 9 (ref 5–15)
BUN: 15 mg/dL (ref 8–23)
CO2: 31 mmol/L (ref 22–32)
CREATININE: 1.11 mg/dL (ref 0.61–1.24)
Calcium: 8.3 mg/dL — ABNORMAL LOW (ref 8.9–10.3)
Chloride: 98 mmol/L (ref 98–111)
GFR calc Af Amer: >60 mL/min (ref >60)
GFR calc non Af Amer: >60 mL/min (ref >60)
Glucose, Bld: 95 mg/dL (ref 70–99)
Potassium: 4.9 mmol/L (ref 3.5–5.1)
Sodium: 138 mmol/L (ref 135–145)

## 2018-12-24 MED ORDER — LACTULOSE 10 GM/15ML PO SOLN
20.0000 g | Freq: Once | ORAL | Status: AC
  Administered 2018-12-24: 20 g via ORAL
  Filled 2018-12-24: qty 30

## 2018-12-24 MED ORDER — SENNOSIDES-DOCUSATE SODIUM 8.6-50 MG PO TABS
1.0000 | ORAL_TABLET | Freq: Two times a day (BID) | ORAL | Status: DC
  Administered 2018-12-24 – 2019-01-05 (×13): 1 via ORAL
  Filled 2018-12-24 (×21): qty 1

## 2018-12-24 MED ORDER — FLEET ENEMA 7-19 GM/118ML RE ENEM
1.0000 | ENEMA | Freq: Every day | RECTAL | Status: DC | PRN
  Administered 2018-12-27: 1 via RECTAL
  Filled 2018-12-24 (×2): qty 1

## 2018-12-24 NOTE — Anesthesia Postprocedure Evaluation (Signed)
Anesthesia Post Note  Patient: Randall Sullivan  Procedure(s) Performed: VIDEO ASSISTED THORACOSCOPY AND RESECTION OF BLEBS RIGHT LOWER LOBE (Right Chest) VIDEO BRONCHOSCOPY (Bilateral Chest)     Patient location during evaluation: PACU Anesthesia Type: General Level of consciousness: awake and alert Pain management: pain level controlled Vital Signs Assessment: post-procedure vital signs reviewed and stable Respiratory status: spontaneous breathing, nonlabored ventilation, respiratory function stable and patient connected to nasal cannula oxygen Cardiovascular status: blood pressure returned to baseline and stable Postop Assessment: no apparent nausea or vomiting Anesthetic complications: no    Last Vitals:  Vitals:   12/24/18 0726 12/24/18 0733  BP: 135/77   Pulse: 75   Resp: 17   Temp: 36.5 C   SpO2: 90% 90%    Last Pain:  Vitals:   12/24/18 0730  TempSrc:   PainSc: 0-No pain                 Tiajuana Amass

## 2018-12-24 NOTE — Progress Notes (Signed)
Offered to ambulate along the hallway , refused  Due to pain, claimed to do it later.

## 2018-12-24 NOTE — Progress Notes (Signed)
Ambulated along the hallway with front wheel walker , about 350 feet tolerated well.

## 2018-12-24 NOTE — Plan of Care (Signed)
  Problem: Health Behavior/Discharge Planning: Goal: Ability to manage health-related needs will improve Outcome: Progressing   Problem: Clinical Measurements: Goal: Ability to maintain clinical measurements within normal limits will improve Outcome: Progressing Goal: Will remain free from infection Outcome: Progressing Goal: Diagnostic test results will improve Outcome: Progressing Goal: Respiratory complications will improve Outcome: Progressing Goal: Cardiovascular complication will be avoided Outcome: Progressing   Problem: Nutrition: Goal: Adequate nutrition will be maintained Outcome: Progressing   Problem: Coping: Goal: Level of anxiety will decrease Outcome: Progressing   Problem: Pain Managment: Goal: General experience of comfort will improve Outcome: Progressing   Problem: Safety: Goal: Ability to remain free from injury will improve Outcome: Progressing   Problem: Skin Integrity: Goal: Risk for impaired skin integrity will decrease Outcome: Progressing   Problem: Education: Goal: Knowledge of disease or condition will improve Outcome: Progressing Goal: Knowledge of the prescribed therapeutic regimen will improve Outcome: Progressing   Problem: Activity: Goal: Risk for activity intolerance will decrease Outcome: Progressing   Problem: Cardiac: Goal: Will achieve and/or maintain hemodynamic stability Outcome: Progressing   Problem: Clinical Measurements: Goal: Postoperative complications will be avoided or minimized Outcome: Progressing   Problem: Respiratory: Goal: Respiratory status will improve Outcome: Progressing   Problem: Pain Management: Goal: Pain level will decrease Outcome: Progressing   Problem: Skin Integrity: Goal: Wound healing without signs and symptoms infection will improve Outcome: Progressing

## 2018-12-24 NOTE — Progress Notes (Addendum)
      [address]       [place] [address]             [phone]      3 Days Post-Op Procedure(s) (LRB): VIDEO ASSISTED THORACOSCOPY AND RESECTION OF BLEBS RIGHT LOWER LOBE (Right) VIDEO BRONCHOSCOPY (Bilateral)   Subjective:  Continues to feel better, but is short of breath at times.  He is on oxygen at home.    Objective: Vital signs in last 24 hours: Temp:  [97.4 F (36.3 C)-98 F (36.7 C)] 97.9 F (36.6 C) (03/26 0346) Pulse Rate:  [57-71] 57 (03/25 2354) Cardiac Rhythm: Normal sinus rhythm (03/26 0400) Resp:  [12-18] 12 (03/25 2354) BP: (128-169)/(67-80) 134/71 (03/26 0346) SpO2:  [90 %-97 %] 90 % (03/25 2354)  Intake/Output from previous day: 03/25 0701 - 03/26 0700 In: 480 [P.O.:480] Out: 1620 [Urine:1550; Chest Tube:70]  General appearance: alert, cooperative and no distress Heart: regular rate and rhythm Lungs: clear to auscultation bilaterally Abdomen: soft, non-tender; bowel sounds normal; no masses,  no organomegaly Extremities: extremities normal, atraumatic, no cyanosis or edema Wound: clean and dry  Lab Results: Recent Labs    12/24/18 0205  WBC 14.3*  HGB 11.6*  HCT 38.8*  PLT 274   BMET:  Recent Labs    12/24/18 0205  NA 138  K 4.9  CL 98  CO2 31  GLUCOSE 95  BUN 15  CREATININE 1.11  CALCIUM 8.3*    PT/INR: No results for input(s): LABPROT, INR in the last 72 hours. ABG    Component Value Date/Time   PHART 7.462 (H) 12/15/2018 1143   HCO3 30.0 (H) 12/15/2018 1143   O2SAT 95.5 12/15/2018 1143   CBG (last 3)  No results for input(s): GLUCAP in the last 72 hours.  Assessment/Plan: S/P Procedure(s) (LRB): VIDEO ASSISTED THORACOSCOPY AND RESECTION OF BLEBS RIGHT LOWER LOBE (Right) VIDEO BRONCHOSCOPY (Bilateral)  1. Chest tube- 70 cc output, no definitive air leak, will try chest tube to water seal today 2. CV- Sinus Brady, BP is mostly controlled- continue Cardizem, Toprol, Clonidine 3. Pulm- severe  bullous emphysema, CXR free from pneumothorax, significant improvement in previous sub q emphysema, continue aggressive pulmonary toilet, uses oxygen at home, wean to baseline use  4. Renal- creatinine at 1.1, K is at 4.9- not on supplementation, weight is stable 5. Lovenox for DVT  6. Dispo- patient is stable, no definitive air leak will try chest tube on water seal, repeat BMET to recheck K and creatinine in AM, continue current care   LOS: 15 days    Ellwood Handler 12/24/2018 Patient seen and examined, agree with above Only complaint is constipation- will address  Revonda Standard. Roxan Hockey, MD [facility] [phone]

## 2018-12-24 NOTE — Care Management Important Message (Signed)
Important Message  Patient Details  Name: Randall Sullivan MRN: [id number] Date of Birth: 05-Apr-1951   Medicare Important Message Given:  Yes    Trezure Cronk Montine Circle 12/24/2018, 2:33 PM

## 2018-12-25 ENCOUNTER — Inpatient Hospital Stay (HOSPITAL_COMMUNITY): Payer: Medicare Other

## 2018-12-25 LAB — BASIC METABOLIC PANEL
Anion gap: 10 (ref 5–15)
BUN: 12 mg/dL (ref 8–23)
CO2: 31 mmol/L (ref 22–32)
Calcium: 8.2 mg/dL — ABNORMAL LOW (ref 8.9–10.3)
Chloride: 96 mmol/L — ABNORMAL LOW (ref 98–111)
Creatinine, Ser: 1.01 mg/dL (ref 0.61–1.24)
GFR calc Af Amer: >60 mL/min (ref >60)
GFR calc non Af Amer: >60 mL/min (ref >60)
Glucose, Bld: 96 mg/dL (ref 70–99)
Potassium: 4.1 mmol/L (ref 3.5–5.1)
Sodium: 137 mmol/L (ref 135–145)

## 2018-12-25 MED ORDER — ENSURE ENLIVE PO LIQD
237.0000 mL | Freq: Three times a day (TID) | ORAL | Status: DC
  Administered 2018-12-25 – 2019-01-05 (×12): 237 mL via ORAL

## 2018-12-25 NOTE — Plan of Care (Signed)
  Problem: Health Behavior/Discharge Planning: Goal: Ability to manage health-related needs will improve Outcome: Progressing   Problem: Clinical Measurements: Goal: Ability to maintain clinical measurements within normal limits will improve Outcome: Progressing Goal: Will remain free from infection Outcome: Progressing Goal: Diagnostic test results will improve Outcome: Progressing Goal: Respiratory complications will improve Outcome: Progressing Goal: Cardiovascular complication will be avoided Outcome: Progressing   Problem: Nutrition: Goal: Adequate nutrition will be maintained Outcome: Progressing   Problem: Coping: Goal: Level of anxiety will decrease Outcome: Progressing   Problem: Pain Managment: Goal: General experience of comfort will improve Outcome: Progressing   Problem: Safety: Goal: Ability to remain free from injury will improve Outcome: Progressing   Problem: Skin Integrity: Goal: Risk for impaired skin integrity will decrease Outcome: Progressing   Problem: Education: Goal: Knowledge of disease or condition will improve Outcome: Progressing Goal: Knowledge of the prescribed therapeutic regimen will improve Outcome: Progressing   Problem: Activity: Goal: Risk for activity intolerance will decrease Outcome: Progressing   Problem: Cardiac: Goal: Will achieve and/or maintain hemodynamic stability Outcome: Progressing   Problem: Clinical Measurements: Goal: Postoperative complications will be avoided or minimized Outcome: Progressing   Problem: Respiratory: Goal: Respiratory status will improve Outcome: Progressing   Problem: Pain Management: Goal: Pain level will decrease Outcome: Progressing   Problem: Skin Integrity: Goal: Wound healing without signs and symptoms infection will improve Outcome: Progressing

## 2018-12-25 NOTE — Progress Notes (Signed)
Tolerated 350 feet ambulation along the hallway with front wheel walker.

## 2018-12-25 NOTE — Progress Notes (Addendum)
Initial Nutrition Assessment  RD working remotely.  DOCUMENTATION CODES:   Not applicable  INTERVENTION:   Add Ensure Enlive TID (each provides 350 kcal, 20 g protein)   NUTRITION DIAGNOSIS:   Increased nutrient needs related to post-op healing as evidenced by estimated needs.  GOAL:   Patient will meet greater than or equal to 90% of their needs  MONITOR:   PO intake, Supplement acceptance, Weight trends, Labs  REASON FOR ASSESSMENT:   LOS    ASSESSMENT:   68 yo male, admitted VATS/repair R air leak. PMH significant for h/o VATS/L lobectomy, COPD, stage 1A lung CA, HTN, PAF, tobacco abuse.  Labs: chloride 96 (L) Meds: Dulcolax, Protonix EC, Miralax, senna-docusate  RD unable to reach pt via phone. Based on chart review, PO intake is fair and wt is stable. Will order Ensure Enlive TID to support increased nutrition needs. Will continue to monitor adequacy of intake.  NUTRITION - FOCUSED PHYSICAL EXAM: Deferred - RD working remotely  Diet Order Hx: 3/18: Heart healthy/CHO mod 3/20: Regular 3/23: NPO 3/24: Clear liquid, Heart   Diet Order            Diet Heart Room service appropriate? Yes; Fluid consistency: Thin  Diet effective now            PO Intake Average 61% x last 8 meals recorded  EDUCATION NEEDS:  No education needs have been identified at this time  Skin:  Skin Assessment: Skin Integrity Issues: Skin Integrity Issues:: Incisions Incisions: R chest  Last BM:  3/26, type 2  Height:  Ht Readings from Last 1 Encounters:  12/21/18 5\' 10"  (1.778 m)    Weight:  Wt Readings from Last 10 Encounters:  12/22/18 93.7 kg  12/07/18 87.8 kg  11/12/18 89 kg  10/30/18 88.2 kg  10/01/18 89.4 kg  03/24/18 87 kg  03/07/18 91.2 kg  Wt fluctuations Stable x 9 months   Ideal Body Weight:  75.5 kg  BMI:  Body mass index is 29.64 kg/m., overweight, however considered protective for age group  Estimated Nutritional Needs: calculated based on  3/24 wt (~94 kg)  Kcal:  2350-2820 (25-30 kcal/kg)  Protein:  113-141 g (1.2-1.5 g/kg)  Fluid:  1 mL/kcal or per MD  Randall Grimmer, MS, RDN, LDN Pager: [phone] Available Mondays and Fridays, 9am-2pm

## 2018-12-25 NOTE — Progress Notes (Addendum)
      [address]       [place] [address]             [phone]      4 Days Post-Op Procedure(s) (LRB): VIDEO ASSISTED THORACOSCOPY AND RESECTION OF BLEBS RIGHT LOWER LOBE (Right) VIDEO BRONCHOSCOPY (Bilateral)   Subjective:  No new complaints.  Continues to do well.  Objective: Vital signs in last 24 hours: Temp:  [97.5 F (36.4 C)-98.4 F (36.9 C)] 97.5 F (36.4 C) (03/27 0520) Pulse Rate:  [65-76] 71 (03/27 0003) Cardiac Rhythm: Normal sinus rhythm (03/27 0400) Resp:  [17-21] 19 (03/27 0520) BP: (128-151)/(65-98) 141/65 (03/27 0520) SpO2:  [95 %-99 %] 95 % (03/27 0003)  Intake/Output from previous day: 03/26 0701 - 03/27 0700 In: 390 [P.O.:390] Out: 621 [Urine:500; Stool:1; Chest Tube:120]  General appearance: alert, cooperative and no distress Heart: regular rate and rhythm Lungs: clear to auscultation bilaterally Abdomen: soft, non-tender; bowel sounds normal; no masses,  no organomegaly Wound: clean and dry  Lab Results: Recent Labs    12/24/18 0205  WBC 14.3*  HGB 11.6*  HCT 38.8*  PLT 274   BMET:  Recent Labs    12/24/18 0205 12/25/18 0157  NA 138 137  K 4.9 4.1  CL 98 96*  CO2 31 31  GLUCOSE 95 96  BUN 15 12  CREATININE 1.11 1.01  CALCIUM 8.3* 8.2*    PT/INR: No results for input(s): LABPROT, INR in the last 72 hours. ABG    Component Value Date/Time   PHART 7.462 (H) 12/15/2018 1143   HCO3 30.0 (H) 12/15/2018 1143   O2SAT 95.5 12/15/2018 1143   CBG (last 3)  No results for input(s): GLUCAP in the last 72 hours.  Assessment/Plan: S/P Procedure(s) (LRB): VIDEO ASSISTED THORACOSCOPY AND RESECTION OF BLEBS RIGHT LOWER LOBE (Right) VIDEO BRONCHOSCOPY (Bilateral)  1. Chest tube- small air leak, leave on water seal today 2. CV- NSR, Sinus Brady, BP controlled- continue Cardizem, Toprol, Clonidine 3. Pulm- severe bullous emphysema, CXR remains stable, no evidence of pneumothorax, sub q air remains stable 4. Renal-  creatinine stable, K is improved at 4.1 5. Lovenox for DVT 6. Dispo- patient is stable, small air leak will leave chest tube on water seal, repeat CXR in AM   LOS: 16 days    Ellwood Handler 12/25/2018   Chart reviewed, patient examined, agree with above. CXR stable There is small intermittent air leak with just an occasional bubble. Will keep to water seal for now.

## 2018-12-26 ENCOUNTER — Inpatient Hospital Stay (HOSPITAL_COMMUNITY): Payer: Medicare Other

## 2018-12-26 MED ORDER — GUAIFENESIN ER 600 MG PO TB12
600.0000 mg | ORAL_TABLET | Freq: Two times a day (BID) | ORAL | Status: DC
  Administered 2018-12-26 – 2019-01-06 (×23): 600 mg via ORAL
  Filled 2018-12-26 (×23): qty 1

## 2018-12-26 NOTE — Progress Notes (Signed)
Patient expressed this evening that he is concerned that he will need to go home with an inhaler. Patient stated that he is supposed to use an inhaler in case of "emergency" but didn't because he is unable to afford the medication.

## 2018-12-26 NOTE — Plan of Care (Signed)
  Problem: Health Behavior/Discharge Planning: Goal: Ability to manage health-related needs will improve Outcome: Progressing   Problem: Clinical Measurements: Goal: Ability to maintain clinical measurements within normal limits will improve Outcome: Progressing Goal: Will remain free from infection Outcome: Progressing Goal: Diagnostic test results will improve Outcome: Progressing Goal: Respiratory complications will improve Outcome: Progressing Goal: Cardiovascular complication will be avoided Outcome: Progressing   Problem: Nutrition: Goal: Adequate nutrition will be maintained Outcome: Progressing   Problem: Coping: Goal: Level of anxiety will decrease Outcome: Progressing   Problem: Pain Managment: Goal: General experience of comfort will improve Outcome: Progressing   Problem: Safety: Goal: Ability to remain free from injury will improve Outcome: Progressing   Problem: Skin Integrity: Goal: Risk for impaired skin integrity will decrease Outcome: Progressing   Problem: Education: Goal: Knowledge of disease or condition will improve Outcome: Progressing Goal: Knowledge of the prescribed therapeutic regimen will improve Outcome: Progressing   Problem: Activity: Goal: Risk for activity intolerance will decrease Outcome: Progressing   Problem: Cardiac: Goal: Will achieve and/or maintain hemodynamic stability Outcome: Progressing   Problem: Clinical Measurements: Goal: Postoperative complications will be avoided or minimized Outcome: Progressing   Problem: Respiratory: Goal: Respiratory status will improve Outcome: Progressing   Problem: Pain Management: Goal: Pain level will decrease Outcome: Progressing   Problem: Skin Integrity: Goal: Wound healing without signs and symptoms infection will improve Outcome: Progressing

## 2018-12-26 NOTE — Progress Notes (Addendum)
      [address]       [place] [address]             [phone]      5 Days Post-Op Procedure(s) (LRB): VIDEO ASSISTED THORACOSCOPY AND RESECTION OF BLEBS RIGHT LOWER LOBE (Right) VIDEO BRONCHOSCOPY (Bilateral) Subjective: He is getting his days and nights confused. Sleeping when I entered the room.   Objective: Vital signs in last 24 hours: Temp:  [98 F (36.7 C)-98.3 F (36.8 C)] 98.1 F (36.7 C) (03/28 0812) Pulse Rate:  [58-95] 73 (03/28 0353) Cardiac Rhythm: Normal sinus rhythm (03/28 0700) Resp:  [16-21] 21 (03/28 0812) BP: (128-144)/(62-78) 130/67 (03/28 0812) SpO2:  [90 %-99 %] 92 % (03/28 0353)     Intake/Output from previous day: 03/27 0701 - 03/28 0700 In: 597 [P.O.:597] Out: 1100 [Urine:1100] Intake/Output this shift: Total I/O In: 120 [P.O.:120] Out: 200 [Urine:200]  General appearance: alert, cooperative and no distress Heart: regular rate and rhythm, S1, S2 normal, no murmur, click, rub or gallop Lungs: rhonchi in bilateral lobes Abdomen: soft, non-tender; bowel sounds normal; no masses,  no organomegaly Extremities: extremities normal, atraumatic, no cyanosis or edema Wound: clean and dry  Lab Results: Recent Labs    12/24/18 0205  WBC 14.3*  HGB 11.6*  HCT 38.8*  PLT 274   BMET:  Recent Labs    12/24/18 0205 12/25/18 0157  NA 138 137  K 4.9 4.1  CL 98 96*  CO2 31 31  GLUCOSE 95 96  BUN 15 12  CREATININE 1.11 1.01  CALCIUM 8.3* 8.2*    PT/INR: No results for input(s): LABPROT, INR in the last 72 hours. ABG    Component Value Date/Time   PHART 7.462 (H) 12/15/2018 1143   HCO3 30.0 (H) 12/15/2018 1143   O2SAT 95.5 12/15/2018 1143   CBG (last 3)  No results for input(s): GLUCAP in the last 72 hours.  Assessment/Plan: S/P Procedure(s) (LRB): VIDEO ASSISTED THORACOSCOPY AND RESECTION OF BLEBS RIGHT LOWER LOBE (Right) VIDEO BRONCHOSCOPY (Bilateral)     1. Chest tube-large air leak with cough. No air  leak seen with deep breathing. CXR remains stable. Continue chest tube to water seal.  2. CV-NSR, SB while sleeping. Continue Cardizem, Toprol, and Clonidine 3. Pulm-severe bullous emphysema, sub q air remains stable.  4. Renal-creatinine stable 1.01, potassium 4.1 5. H and H stable 6. Continue Lovenox for DVT prophylaxis  Plan: Keep chest tube in place. On mucinex and nebs. Will order flutter valve. Encouraged incentive spirometer. Encouraged ambulation in the halls. Follow-up CXR in the morning.     LOS: 17 days    Elgie Collard 12/26/2018   Chart reviewed, patient examined, agree with above. CXR stable. This is no air leak with breathing while doing his IS but some bubbles with cough 3-4 chambers. Would continue to water seal and observe.

## 2018-12-27 ENCOUNTER — Inpatient Hospital Stay (HOSPITAL_COMMUNITY): Payer: Medicare Other

## 2018-12-27 NOTE — Plan of Care (Signed)
  Problem: Health Behavior/Discharge Planning: Goal: Ability to manage health-related needs will improve Outcome: Progressing   Problem: Clinical Measurements: Goal: Ability to maintain clinical measurements within normal limits will improve Outcome: Progressing Goal: Will remain free from infection Outcome: Progressing Goal: Diagnostic test results will improve Outcome: Progressing Goal: Respiratory complications will improve Outcome: Progressing Goal: Cardiovascular complication will be avoided Outcome: Progressing   Problem: Nutrition: Goal: Adequate nutrition will be maintained Outcome: Progressing   Problem: Coping: Goal: Level of anxiety will decrease Outcome: Progressing   Problem: Pain Managment: Goal: General experience of comfort will improve Outcome: Progressing   Problem: Safety: Goal: Ability to remain free from injury will improve Outcome: Progressing   Problem: Skin Integrity: Goal: Risk for impaired skin integrity will decrease Outcome: Progressing   Problem: Education: Goal: Knowledge of disease or condition will improve Outcome: Progressing Goal: Knowledge of the prescribed therapeutic regimen will improve Outcome: Progressing   Problem: Activity: Goal: Risk for activity intolerance will decrease Outcome: Progressing   Problem: Cardiac: Goal: Will achieve and/or maintain hemodynamic stability Outcome: Progressing   Problem: Clinical Measurements: Goal: Postoperative complications will be avoided or minimized Outcome: Progressing   Problem: Respiratory: Goal: Respiratory status will improve Outcome: Progressing   Problem: Pain Management: Goal: Pain level will decrease Outcome: Progressing   Problem: Skin Integrity: Goal: Wound healing without signs and symptoms infection will improve Outcome: Progressing

## 2018-12-27 NOTE — Progress Notes (Signed)
6 Days Post-Op Procedure(s) (LRB): VIDEO ASSISTED THORACOSCOPY AND RESECTION OF BLEBS RIGHT LOWER LOBE (Right) VIDEO BRONCHOSCOPY (Bilateral) Subjective: No specific complaints. Had BM this am so belly feels better.  Objective: Vital signs in last 24 hours: Temp:  [97.6 F (36.4 C)-98.2 F (36.8 C)] 97.9 F (36.6 C) (03/29 0815) Pulse Rate:  [63-82] 74 (03/29 0815) Cardiac Rhythm: Normal sinus rhythm (03/29 0700) Resp:  [17-23] 21 (03/29 0815) BP: (98-133)/(57-84) 131/62 (03/29 0815) SpO2:  [89 %-97 %] 97 % (03/29 0815)  Hemodynamic parameters for last 24 hours:    Intake/Output from previous day: 03/28 0701 - 03/29 0700 In: 700 [P.O.:700] Out: 750 [Urine:750] Intake/Output this shift: Total I/O In: 240 [P.O.:240] Out: 0   General appearance: alert and cooperative Heart: regular rate and rhythm, S1, S2 normal, no murmur, click, rub or gallop Lungs: clear to auscultation bilaterally Wound: incisions ok CT has no air leak with breathing but rush of air to 4 chambers with cough.   Lab Results: No results for input(s): WBC, HGB, HCT, PLT in the last 72 hours. BMET:  Recent Labs    12/25/18 0157  NA 137  K 4.1  CL 96*  CO2 31  GLUCOSE 96  BUN 12  CREATININE 1.01  CALCIUM 8.2*    PT/INR: No results for input(s): LABPROT, INR in the last 72 hours. ABG    Component Value Date/Time   PHART 7.462 (H) 12/15/2018 1143   HCO3 30.0 (H) 12/15/2018 1143   O2SAT 95.5 12/15/2018 1143   CBG (last 3)  No results for input(s): GLUCAP in the last 72 hours.  Assessment/Plan: S/P Procedure(s) (LRB): VIDEO ASSISTED THORACOSCOPY AND RESECTION OF BLEBS RIGHT LOWER LOBE (Right) VIDEO BRONCHOSCOPY (Bilateral)  Chest tube still has some air leak with cough. Will continue to water seal. Repeat CXR in am.    LOS: 18 days    Gaye Pollack 12/27/2018

## 2018-12-27 NOTE — TOC Progression Note (Signed)
Transition of Care [facility]) - Progression Note    Patient Details  Name: Greig Altergott MRN: [id number] Date of Birth: 1950/12/24  [facility]) CM/SW Contact  Claudie Leach, RN Phone Number: 12/27/2018, 4:33 PM  Clinical Narrative:   CM consult because patient unable to afford Breo Ell[employer]a [phone]).  Pt states he has no drug coverage benefits.  Pt asking if he can have other inhalers (proventil - $22) that are affordable.  Discussed with wife also. Wife states they usually get prescriptions filled at [facility] and use Good[employer] coupons. Advised wife to check into [facility] [employer] prescription plan.    Pt will need inexpensive prescriptions if any are new upon discharge.  Pt requests a proventil inhaler.  Expected Discharge Plan: [place] Barriers to Discharge: Inadequate or no insurance(No drug coverage)  Expected Discharge Plan and Services Expected Discharge Plan: [place] In-house Referral: [facility](for prescription assistance and Medicaid application) Discharge Planning Services: CM Consult   Living arrangements for the past 2 months: Single Family Home                     Readmission Risk Interventions Readmission Risk Prevention Plan 12/27/2018 12/27/2018  Transportation Screening - Complete  PCP or Specialist Appt within 5-7 Days - Complete  Home Care Screening Complete -  Medication Review (RN CM) Complete -  Some recent data might be hidden

## 2018-12-28 ENCOUNTER — Inpatient Hospital Stay (HOSPITAL_COMMUNITY): Payer: Medicare Other

## 2018-12-28 MED ORDER — HYDROCORTISONE 2.5 % RE CREA
TOPICAL_CREAM | RECTAL | Status: DC | PRN
  Administered 2018-12-29: 08:00:00 via RECTAL
  Filled 2018-12-28: qty 28.35

## 2018-12-28 NOTE — Care Management Important Message (Signed)
Important Message  Patient Details  Name: Randall Sullivan MRN: [id number] Date of Birth: Feb 23, 1951   Medicare Important Message Given:  Yes    Monnica Saltsman 12/28/2018, 1:27 PM

## 2018-12-28 NOTE — Consult Note (Signed)
   [facility] CM Inpatient Consult   12/28/2018  Randall Sullivan 12-Apr-1951 [id number]    Thank you for this consult. Order received through [place] from the transition of care CM (ASander Radon). Chart was reviewed for services with [place] Management needs under his Medicare plan.    Patient's current primary care provider is  Brigitte Pulse, DO with [facility] who is not a [facility] provider and is not an Equities trader.  Patient is not currently a beneficiary of the attributed [place] in the [employer].   Membership roster used to verify non- eligible status. Call placed and left message for transition of care RN CM- to notify about it.    For questions and additional information, please call:  Giovonnie Trettel A. Jaysin Gayler, BSN, RN-BC [facility] Liaison Cell: [phone]

## 2018-12-28 NOTE — Progress Notes (Signed)
Wife, [name], called in for update.  Patient sleeping soundly.  Patient has been up to bedside chair for a total of approx 8 hours today.  VSS.  Right lateral chest tube remains w/1+ air leak w/cough but no air leak noted w/regular respirations.  CT on water seal.  Will continue to monitor.

## 2018-12-28 NOTE — Progress Notes (Addendum)
      [address]       [place] [address]             [phone]      7 Days Post-Op Procedure(s) (LRB): VIDEO ASSISTED THORACOSCOPY AND RESECTION OF BLEBS RIGHT LOWER LOBE (Right) VIDEO BRONCHOSCOPY (Bilateral)   Subjective:  No new complaints.  Continues to do much better.  Objective: Vital signs in last 24 hours: Temp:  [97.5 F (36.4 C)-98.7 F (37.1 C)] 98.7 F (37.1 C) (03/30 0744) Pulse Rate:  [65-74] 67 (03/29 1900) Cardiac Rhythm: Normal sinus rhythm (03/30 0744) Resp:  [18-21] 20 (03/29 1900) BP: (114-132)/(62-73) 132/73 (03/30 0744) SpO2:  [94 %-98 %] 94 % (03/30 0744)  Intake/Output from previous day: 03/29 0701 - 03/30 0700 In: 720 [P.O.:720] Out: 200 [Urine:200]  General appearance: alert, cooperative and no distress Heart: regular rate and rhythm Lungs: clear to auscultation bilaterally Abdomen: soft, non-tender; bowel sounds normal; no masses,  no organomegaly Extremities: extremities normal, atraumatic, no cyanosis or edema Wound: clean and dry, chest tube dressing, falling off  Lab Results: No results for input(s): WBC, HGB, HCT, PLT in the last 72 hours. BMET: No results for input(s): NA, K, CL, CO2, GLUCOSE, BUN, CREATININE, CALCIUM in the last 72 hours.  PT/INR: No results for input(s): LABPROT, INR in the last 72 hours. ABG    Component Value Date/Time   PHART 7.462 (H) 12/15/2018 1143   HCO3 30.0 (H) 12/15/2018 1143   O2SAT 95.5 12/15/2018 1143   CBG (last 3)  No results for input(s): GLUCAP in the last 72 hours.  Assessment/Plan: S/P Procedure(s) (LRB): VIDEO ASSISTED THORACOSCOPY AND RESECTION OF BLEBS RIGHT LOWER LOBE (Right) VIDEO BRONCHOSCOPY (Bilateral)  1. Chest tube- minimal output, + air leak large, however  I changed chest tube dressing which improved air leak 2. CV- NSR, Cardizem, catapres, Toprol 3. Pulm- severe bullous emphysema, on baseline oxygen requirement 4. Lovenox for DVT 5. Dispo- patient is  stable, small air leak with cough, leave chest tube on water seal, repeat CXR in AM   LOS: 19 days    Erin Barrett 12/28/2018 Patient seen and examined, agree with above He has a small air leak with cough. None with normal respirations. Continue with CT to water seal.  Revonda Standard. Roxan Hockey, MD [facility] [phone]

## 2018-12-29 ENCOUNTER — Inpatient Hospital Stay (HOSPITAL_COMMUNITY): Payer: Medicare Other

## 2018-12-29 ENCOUNTER — Ambulatory Visit: Payer: Medicare Other | Admitting: Thoracic Surgery (Cardiothoracic Vascular Surgery)

## 2018-12-29 NOTE — Progress Notes (Addendum)
      [address]       [place] [address]             [phone]      8 Days Post-Op Procedure(s) (LRB): VIDEO ASSISTED THORACOSCOPY AND RESECTION OF BLEBS RIGHT LOWER LOBE (Right) VIDEO BRONCHOSCOPY (Bilateral)   Subjective:  No new complaints. States hesitant to leave with chest tube as he has been through a lot and doesn't want to leave too early.  Received breathing treatment  Objective: Vital signs in last 24 hours: Temp:  [97.5 F (36.4 C)-98.7 F (37.1 C)] 97.9 F (36.6 C) (03/31 0328) Pulse Rate:  [67-73] 73 (03/31 0719) Cardiac Rhythm: Normal sinus rhythm (03/30 1904) Resp:  [20-25] 22 (03/31 0719) BP: (114-133)/(67-77) 133/71 (03/31 0328) SpO2:  [89 %-98 %] 89 % (03/31 0719)  Intake/Output from previous day: 03/30 0701 - 03/31 0700 In: 600 [P.O.:600] Out: 575 [Urine:575]  General appearance: alert, cooperative and no distress Heart: regular rate and rhythm Lungs: wheezes bilaterally Abdomen: soft, non-tender; bowel sounds normal; no masses,  no organomegaly Extremities: extremities normal, atraumatic, no cyanosis or edema Wound: clean and dry  Lab Results: No results for input(s): WBC, HGB, HCT, PLT in the last 72 hours. BMET: No results for input(s): NA, K, CL, CO2, GLUCOSE, BUN, CREATININE, CALCIUM in the last 72 hours.  PT/INR: No results for input(s): LABPROT, INR in the last 72 hours. ABG    Component Value Date/Time   PHART 7.462 (H) 12/15/2018 1143   HCO3 30.0 (H) 12/15/2018 1143   O2SAT 95.5 12/15/2018 1143   CBG (last 3)  No results for input(s): GLUCAP in the last 72 hours.  Assessment/Plan: S/P Procedure(s) (LRB): VIDEO ASSISTED THORACOSCOPY AND RESECTION OF BLEBS RIGHT LOWER LOBE (Right) VIDEO BRONCHOSCOPY (Bilateral)  1. Chest tube- + air leak with cough, can hear air moving around chest tube 2. CV- hemodynamically stable continue Cardizem, Catapres, Toprol 3. Pulm- severe bullous emphysema, CXR remains stable, no  pneumothorax present 4. Lovenox for DVT prophylaxis 5. Dispo- patient stable, intermittent 1+ air leak with cough, chest tube on water seal   LOS: 20 days  Patient seen and examined, agree with above CXR difficult to interpret as there are multiple "lines" that could represent a pneumo Will place CT back to suction for now  [place]. Roxan Hockey, MD [facility] [phone]   Ellwood Handler 12/29/2018

## 2018-12-29 NOTE — TOC Progression Note (Addendum)
Transition of Care [facility]) - Progression Note    Patient Details  Name: Randall Sullivan MRN: [id number] Date of Birth: [birth year]  Transition of Care [facility]) CM/SW Contact  Maryclare Labrador, RN Phone Number: 12/29/2018, 9:04 AM  Clinical Narrative:   CM offered pt choice per [facility] list - pt deferred choice to wife.  Wife chose [facility] - agency contacted and referral accepted.  CM left physician sticky note informing that he does not have prescription insurance.  Pt is not appropriate for [facility].  [name]     Expected Discharge Plan: [place] Barriers to Discharge: (P) Continued Medical Work up(continues to have Chest Tube)  Expected Discharge Plan and Services Expected Discharge Plan: [place] In-house Referral: [facility](for prescription assistance and Medicaid application) Discharge Planning Services: CM Consult Post Acute Care Choice: (P) [place] arrangements for the past 2 months: [place]: RN [place] Agency: [place] ([facility])   Social Determinants of Health (SDOH) Interventions    Readmission Risk Interventions Readmission Risk Prevention Plan 12/27/2018 12/27/2018  Transportation Screening - Complete  PCP or Specialist Appt within 5-7 Days - Complete  Home Care Screening Complete -  Medication Review (RN CM) Complete -  Some recent data might be hidden

## 2018-12-30 ENCOUNTER — Inpatient Hospital Stay (HOSPITAL_COMMUNITY): Payer: Medicare Other

## 2018-12-30 NOTE — Progress Notes (Signed)
Nutrition Follow-up  RD working remotely.  INTERVENTION:   - Continue Ensure Enlive po TID, each supplement provides 350 kcal and 20 grams of protein  - Encourage adequate PO intake  NUTRITION DIAGNOSIS:   Increased nutrient needs related to post-op healing as evidenced by estimated needs.  Progressing, being addressed via oral nutrition supplements  GOAL:   Patient will meet greater than or equal to 90% of their needs  Progressing  MONITOR:   PO intake, Supplement acceptance, Weight trends, Labs  REASON FOR ASSESSMENT:   LOS    ASSESSMENT:   68 yo male, admitted VATS/repair R air leak. PMH significant for h/o VATS/L lobectomy, COPD, stage 1A lung CA, HTN, PAF, tobacco abuse.  Noted weight down 14.7 kg since 3/24. Unsure whether this is accurate given this is a 15.7% weight loss in 1 week which is severe and significant for timeframe. Prior to weight obtained today, pt's weight had been stable between 93.3-93.7 kg from 3/11-3/24.  Reviewed RN edema assessment. Pt with mild pitting edema to BLE.  Called pt's room phone with no answer. Will continue with current plan of care at this time given pt's PO intake has improved and he is accepting ~50% of Ensure Enlive oral nutrition supplements.  Meal Completion: 60-100% x 6 meals (averaging 82%)  Medications reviewed and include: Dulcolax, Ensure Enlive TID (pt accepting ~50%), Protonix, Miralax, Senna  Labs reviewed. No new labs since 12/25/18.  UOP: 500 ml x 24 hours CT: 0 ml x 24 hours I/O's: -2.0 L since admit  Diet Order:   Diet Order            Diet Heart Room service appropriate? Yes; Fluid consistency: Thin  Diet effective now              EDUCATION NEEDS:   No education needs have been identified at this time  Skin:  Skin Assessment: Skin Integrity Issues: Incisions: right chest  Last BM:  12/28/18  Height:   Ht Readings from Last 1 Encounters:  12/21/18 5\' 10"  (1.778 m)    Weight:   Wt  Readings from Last 1 Encounters:  12/30/18 79 kg    Ideal Body Weight:  75.5 kg  BMI:  Body mass index is 24.99 kg/m.  Estimated Nutritional Needs:   Kcal:  2350-2820 (25-30 kcal/kg)  Protein:  113-141 g (1.2-1.5 g/kg)  Fluid:  1 mL/kcal or per MD    Gaynell Face, MS, RD, LDN Inpatient Clinical Dietitian Pager: [phone] Weekend/After Hours: [phone]

## 2018-12-30 NOTE — Progress Notes (Addendum)
      [address]       [place] [address]             [phone]      9 Days Post-Op Procedure(s) (LRB): VIDEO ASSISTED THORACOSCOPY AND RESECTION OF BLEBS RIGHT LOWER LOBE (Right) VIDEO BRONCHOSCOPY (Bilateral)   Subjective:  No new complaints.  Receiving breathing treatment.  Objective: Vital signs in last 24 hours: Temp:  [97.4 F (36.3 C)-98 F (36.7 C)] 97.5 F (36.4 C) (04/01 0500) Pulse Rate:  [59-92] 92 (04/01 0721) Cardiac Rhythm: Normal sinus rhythm (03/31 1919) Resp:  [16-25] 20 (04/01 0721) BP: (108-147)/(62-77) 147/77 (04/01 0500) SpO2:  [92 %-99 %] 94 % (04/01 0721) FiO2 (%):  [36 %] 36 % (04/01 0721)  Intake/Output from previous day: 03/31 0701 - 04/01 0700 In: 240 [P.O.:240] Out: 500 [Urine:500]  General appearance: alert, cooperative and no distress Heart: regular rate and rhythm Lungs: + crackles right base Abdomen: soft, non-tender; bowel sounds normal; no masses,  no organomegaly Wound: clean and dry  Lab Results: No results for input(s): WBC, HGB, HCT, PLT in the last 72 hours. BMET: No results for input(s): NA, K, CL, CO2, GLUCOSE, BUN, CREATININE, CALCIUM in the last 72 hours.  PT/INR: No results for input(s): LABPROT, INR in the last 72 hours. ABG    Component Value Date/Time   PHART 7.462 (H) 12/15/2018 1143   HCO3 30.0 (H) 12/15/2018 1143   O2SAT 95.5 12/15/2018 1143   CBG (last 3)  No results for input(s): GLUCAP in the last 72 hours.  Assessment/Plan: S/P Procedure(s) (LRB): VIDEO ASSISTED THORACOSCOPY AND RESECTION OF BLEBS RIGHT LOWER LOBE (Right) VIDEO BRONCHOSCOPY (Bilateral)  1. Chest tube- placed back on suction yesterday, +1 air leak with cough- leave on suction today 2. Pulm- severe bullous emphysema, on baseline oxygen, receiving breathing treatment, crackles on exam which are new today, could be related to volume status vs. Early infectious etiology 3. CV- hemodynamically stable 4. ID- afebrile,  mild leukocytosis on last CBC, will repeat... not currently on ABX 5. Lovenox for DVT prophylaxis 6. Dispo- chest tube back on suction yesterday, +1 air leak with cough, will leave on suction today, new crackles at lung base will obtain weight to assess volume status, may benefit from lasix, vs. ABX if infectious etiology is suspected, continue current care, repeat CXR in AM   LOS: 21 days    Junie Panning Barrett 12/30/2018 Patient seen and examined, agree with above I don't see any indication for antibiotics at this point Air leak appears smaller- will leave on suction again today  Remo Lipps C. Roxan Hockey, MD [facility] [phone]

## 2018-12-31 ENCOUNTER — Inpatient Hospital Stay (HOSPITAL_COMMUNITY): Payer: Medicare Other

## 2018-12-31 LAB — CBC
HCT: 34.7 % — ABNORMAL LOW (ref 39.0–52.0)
Hemoglobin: 10.6 g/dL — ABNORMAL LOW (ref 13.0–17.0)
MCH: 26.2 pg (ref 26.0–34.0)
MCHC: 30.5 g/dL (ref 30.0–36.0)
MCV: 85.7 fL (ref 80.0–100.0)
Platelets: 459 10*3/uL — ABNORMAL HIGH (ref 150–400)
RBC: 4.05 MIL/uL — ABNORMAL LOW (ref 4.22–5.81)
RDW: 15.3 % (ref 11.5–15.5)
WBC: 14.8 10*3/uL — ABNORMAL HIGH (ref 4.0–10.5)
nRBC: 0 % (ref 0.0–0.2)

## 2018-12-31 NOTE — Progress Notes (Signed)
      [address]       [place] [address]             [phone]      10 Days Post-Op Procedure(s) (LRB): VIDEO ASSISTED THORACOSCOPY AND RESECTION OF BLEBS RIGHT LOWER LOBE (Right) VIDEO BRONCHOSCOPY (Bilateral)   Subjective:  No new complaints. Up in chair getting ready to eat breakfast.  Objective: Vital signs in last 24 hours: Temp:  [98 F (36.7 C)-98.3 F (36.8 C)] 98.1 F (36.7 C) (04/02 0322) Pulse Rate:  [62-86] 86 (04/02 0712) Cardiac Rhythm: Normal sinus rhythm (04/02 0430) Resp:  [17-20] 20 (04/02 0712) BP: (110-134)/(67-83) 128/67 (04/02 0322) SpO2:  [87 %-100 %] 93 % (04/02 0712) FiO2 (%):  [36 %] 36 % (04/01 1430) Weight:  [79 kg] 79 kg (04/01 0900)  Intake/Output from previous day: 04/01 0701 - 04/02 0700 In: 240 [P.O.:240] Out: 450 [Urine:400; Chest Tube:50]   Intake/Output this shift:No intake/output data recorded.  General appearance: alert, cooperative and no distress Heart: regular rate and rhythm Lungs: wheezes bilaterally Abdomen: soft, non-tender; bowel sounds normal; no masses,  no organomegaly Wound: clean and dry  Lab Results: Recent Labs    12/31/18 0213  WBC 14.8*  HGB 10.6*  HCT 34.7*  PLT 459*   BMET: No results for input(s): NA, K, CL, CO2, GLUCOSE, BUN, CREATININE, CALCIUM in the last 72 hours.  PT/INR: No results for input(s): LABPROT, INR in the last 72 hours. ABG    Component Value Date/Time   PHART 7.462 (H) 12/15/2018 1143   HCO3 30.0 (H) 12/15/2018 1143   O2SAT 95.5 12/15/2018 1143   CBG (last 3)  No results for input(s): GLUCAP in the last 72 hours.  Assessment/Plan: S/P Procedure(s) (LRB): VIDEO ASSISTED THORACOSCOPY AND RESECTION OF BLEBS RIGHT LOWER LOBE (Right) VIDEO BRONCHOSCOPY (Bilateral)  1. Chest tube- remains on suction, small air leak with cough, appears smaller than yesterday, leave on suction today 2. Pulm- no acute issues, on baseline oxygen requirement, crackles have  resolved, wheezing bilaterally continue aggressive pulmonary toilet, continue nebs prn, Mucinex... CXR remains stable with apical pneumothorax 3. CV- hemodynamically stable in NSR- continue Toprol, Cardizem, Clonidine 4. ID- remains afebrile, weight is below baseline, no needs for diuretics or ABX at this time 5. Lovenox for DVT prophylaxis 6. Dispo- patient stable continued air leak 1+, improved with cough, leave on suction today, remains hemodynamically stable,    LOS: 22 days   Ellwood Handler, PA-C 12/31/2018

## 2019-01-01 ENCOUNTER — Inpatient Hospital Stay (HOSPITAL_COMMUNITY): Payer: Medicare Other

## 2019-01-01 NOTE — Progress Notes (Signed)
Offered to ambulate along the hallway but refused , claimed to do it later.

## 2019-01-01 NOTE — Progress Notes (Signed)
Encouraged to ambulate along the hallway for the second time, but refused . Explained the significance of ambulation but kept saying to do it later.

## 2019-01-01 NOTE — Progress Notes (Addendum)
      [address]       [place] [address]             [phone]      11 Days Post-Op Procedure(s) (LRB): VIDEO ASSISTED THORACOSCOPY AND RESECTION OF BLEBS RIGHT LOWER LOBE (Right) VIDEO BRONCHOSCOPY (Bilateral)   Subjective:  A little more discomfort around chest tube site today. Otherwise without complaints  Objective: Vital signs in last 24 hours: Temp:  [97.8 F (36.6 C)-98.8 F (37.1 C)] 98.8 F (37.1 C) (04/03 0401) Pulse Rate:  [67-102] 78 (04/03 0709) Cardiac Rhythm: Normal sinus rhythm (04/03 0103) Resp:  [17-24] 20 (04/03 0709) BP: (111-134)/(66-85) 129/66 (04/03 0401) SpO2:  [92 %-100 %] 92 % (04/03 0709) FiO2 (%):  [32 %-36 %] 32 % (04/03 0709)  Intake/Output from previous day: 04/02 0701 - 04/03 0700 In: 600 [P.O.:600] Out: 620 [Urine:600; Chest Tube:20]  General appearance: alert, cooperative and no distress Heart: regular rate and rhythm Lungs: clear to auscultation bilaterally Abdomen: soft, non-tender; bowel sounds normal; no masses,  no organomegaly Wound: clean and dry  Lab Results: Recent Labs    12/31/18 0213  WBC 14.8*  HGB 10.6*  HCT 34.7*  PLT 459*   BMET: No results for input(s): NA, K, CL, CO2, GLUCOSE, BUN, CREATININE, CALCIUM in the last 72 hours.  PT/INR: No results for input(s): LABPROT, INR in the last 72 hours. ABG    Component Value Date/Time   PHART 7.462 (H) 12/15/2018 1143   HCO3 30.0 (H) 12/15/2018 1143   O2SAT 95.5 12/15/2018 1143   CBG (last 3)  No results for input(s): GLUCAP in the last 72 hours.  Assessment/Plan: S/P Procedure(s) (LRB): VIDEO ASSISTED THORACOSCOPY AND RESECTION OF BLEBS RIGHT LOWER LOBE (Right) VIDEO BRONCHOSCOPY (Bilateral)  1. Chest tube- stable 1+ air leak, will try on water seal today, CXR order placed for this morning, will review once completed 2. CV- hemodynamically stable in NSR, on Cardizem, Toprol, Clonidine 3. Pulm- severe bullous emphysema, on home oxygen  requirement, continue nebs, mucinex prn, IS 4. Lovenox for DVT 5. Dispo- patient stable, transition chest tube to water seal today, CXR has remained stable, repeat CXR in AM   LOS: 23 days    Randall Sullivan 01/01/2019 Patient seen and examined, agree with above No significant change with suction Will place back to water seal with plan for conversion to mini express and home with CT  Randall Lipps C. Roxan Hockey, MD [facility] [phone]

## 2019-01-02 ENCOUNTER — Inpatient Hospital Stay (HOSPITAL_COMMUNITY): Payer: Medicare Other

## 2019-01-02 NOTE — Progress Notes (Signed)
12 Days Post-Op Procedure(s) (LRB): VIDEO ASSISTED THORACOSCOPY AND RESECTION OF BLEBS RIGHT LOWER LOBE (Right) VIDEO BRONCHOSCOPY (Bilateral) Subjective: Maintaining sinus rhythm, oxygen saturation normal on nasal cannula Chest x-ray shows no significant pneumothorax Pleur-evac with minimal drainage, small air leak with cough only Objective: Vital signs in last 24 hours: Temp:  [97.9 F (36.6 C)-98.1 F (36.7 C)] 98.1 F (36.7 C) (04/04 0858) Pulse Rate:  [64-80] 80 (04/04 0356) Cardiac Rhythm: Normal sinus rhythm (04/04 0858) Resp:  [16-26] 18 (04/04 0605) BP: (117-149)/(70-86) 131/86 (04/04 0858) SpO2:  [90 %-99 %] 98 % (04/04 0858) FiO2 (%):  [32 %] 32 % (04/03 1506)  Hemodynamic parameters for last 24 hours:  Stable  Intake/Output from previous day: 04/03 0701 - 04/04 0700 In: 600 [P.O.:600] Out: 350 [Urine:350] Intake/Output this shift: No intake/output data recorded.  Alert and comfortable VATS incision clean and dry Chest tube dressing intact dry Minimal 1 column air bubble with cough on Pleur-evac Maintaining sinus rhythm Lungs clear  Lab Results: Recent Labs    12/31/18 0213  WBC 14.8*  HGB 10.6*  HCT 34.7*  PLT 459*   BMET: No results for input(s): NA, K, CL, CO2, GLUCOSE, BUN, CREATININE, CALCIUM in the last 72 hours.  PT/INR: No results for input(s): LABPROT, INR in the last 72 hours. ABG    Component Value Date/Time   PHART 7.462 (H) 12/15/2018 1143   HCO3 30.0 (H) 12/15/2018 1143   O2SAT 95.5 12/15/2018 1143   CBG (last 3)  No results for input(s): GLUCAP in the last 72 hours.  Assessment/Plan: S/P Procedure(s) (LRB): VIDEO ASSISTED THORACOSCOPY AND RESECTION OF BLEBS RIGHT LOWER LOBE (Right) VIDEO BRONCHOSCOPY (Bilateral) Leave chest tube to waterseal Follow-up chest x-ray tomorrow Encourage incentive spirometry and good oral nutrition and ambulation   LOS: 24 days    Randall Sullivan 01/02/2019

## 2019-01-02 NOTE — Plan of Care (Signed)
Patient is progressing toward all care plan goals.  Patient continues to have right lateral chest tube, to water seal.  No air leak w/regular respirations; however continues to have 1+ air leak when he coughs.  Patient remains on O2 @ 4L/[place].  He receives breathing treatments per orders.  Patient has little motivation to ambualte in the halls; however, complies w/extra coaching.  Will continue to monitor progress until discharge home.

## 2019-01-03 ENCOUNTER — Inpatient Hospital Stay (HOSPITAL_COMMUNITY): Payer: Medicare Other

## 2019-01-03 MED ORDER — WITCH HAZEL-GLYCERIN EX PADS
MEDICATED_PAD | CUTANEOUS | Status: DC | PRN
  Filled 2019-01-03: qty 100

## 2019-01-03 NOTE — Progress Notes (Signed)
13 Days Post-Op Procedure(s) (LRB): VIDEO ASSISTED THORACOSCOPY AND RESECTION OF BLEBS RIGHT LOWER LOBE (Right) VIDEO BRONCHOSCOPY (Bilateral) Subjective: Patient without complaint Patient still has significant air leak with cough Chest x-ray shows no change with good reexpansion of right lung and basilar scarring Maintaining sinus rhythm  Objective: Vital signs in last 24 hours: Temp:  [97.6 F (36.4 C)-98.3 F (36.8 C)] 98 F (36.7 C) (04/05 1104) Pulse Rate:  [61-79] 72 (04/05 1104) Cardiac Rhythm: Normal sinus rhythm (04/05 0700) Resp:  [16-24] 17 (04/05 1104) BP: (125-149)/(71-89) 141/76 (04/05 1104) SpO2:  [90 %-98 %] 96 % (04/05 1104)  Hemodynamic parameters for last 24 hours:    Intake/Output from previous day: 04/04 0701 - 04/05 0700 In: -  Out: 100 [Urine:100] Intake/Output this shift: Total I/O In: 240 [P.O.:240] Out: 250 [Urine:250]  Exam  Chest tube secured and dressing dry VATS incision healing well Regular heart rate Oxygen saturation adequate on 3 L nasal cannula  Lab Results: No results for input(s): WBC, HGB, HCT, PLT in the last 72 hours. BMET: No results for input(s): NA, K, CL, CO2, GLUCOSE, BUN, CREATININE, CALCIUM in the last 72 hours.  PT/INR: No results for input(s): LABPROT, INR in the last 72 hours. ABG    Component Value Date/Time   PHART 7.462 (H) 12/15/2018 1143   HCO3 30.0 (H) 12/15/2018 1143   O2SAT 95.5 12/15/2018 1143   CBG (last 3)  No results for input(s): GLUCAP in the last 72 hours.  Assessment/Plan: S/P Procedure(s) (LRB): VIDEO ASSISTED THORACOSCOPY AND RESECTION OF BLEBS RIGHT LOWER LOBE (Right) VIDEO BRONCHOSCOPY (Bilateral) COPD, bullous emphysema Status post wedge resection with persistent air leak and chest tube in good position Chest x-ray in a.m.  LOS: 25 days    Randall Sullivan 01/03/2019

## 2019-01-04 ENCOUNTER — Inpatient Hospital Stay (HOSPITAL_COMMUNITY): Payer: Medicare Other

## 2019-01-04 NOTE — Care Management Important Message (Signed)
Important Message  Patient Details  Name: Randall Sullivan MRN: [id number] Date of Birth: 1951/08/18   Medicare Important Message Given:  Yes    Randall Sullivan 01/04/2019, 3:59 PM

## 2019-01-04 NOTE — Progress Notes (Signed)
      [address]       [place] [address]             [phone]      Randall Sullivan tolerated chest tube clamping without any symptoms but follow up CXR showed a pneumo and significant SQ emphysema. Chest tube unclamped  Will place to mini-express and consider dc home with tube  Remo Lipps C. Roxan Hockey, MD [facility] [phone]

## 2019-01-04 NOTE — Progress Notes (Signed)
14 Days Post-Op Procedure(s) (LRB): VIDEO ASSISTED THORACOSCOPY AND RESECTION OF BLEBS RIGHT LOWER LOBE (Right) VIDEO BRONCHOSCOPY (Bilateral) Subjective: Some discomfort at chest tube site  Objective: Vital signs in last 24 hours: Temp:  [97.7 F (36.5 C)-98.4 F (36.9 C)] 97.7 F (36.5 C) (04/06 0723) Pulse Rate:  [67-80] 73 (04/06 0723) Cardiac Rhythm: Normal sinus rhythm (04/06 0400) Resp:  [14-27] 18 (04/06 0300) BP: (109-141)/(67-87) 130/73 (04/06 0300) SpO2:  [96 %-98 %] 98 % (04/06 0300)  Hemodynamic parameters for last 24 hours:    Intake/Output from previous day: 04/05 0701 - 04/06 0700 In: 480 [P.O.:480] Out: 490 [Urine:450; Chest Tube:40] Intake/Output this shift: Total I/O In: 360 [P.O.:360] Out: 100 [Urine:100]  General appearance: alert, cooperative and no distress Neurologic: intact Heart: regular rate and rhythm Lungs: diminished breath sounds bilaterally Wound: clean and dry small air leak with initial cough  Lab Results: No results for input(s): WBC, HGB, HCT, PLT in the last 72 hours. BMET: No results for input(s): NA, K, CL, CO2, GLUCOSE, BUN, CREATININE, CALCIUM in the last 72 hours.  PT/INR: No results for input(s): LABPROT, INR in the last 72 hours. ABG    Component Value Date/Time   PHART 7.462 (H) 12/15/2018 1143   HCO3 30.0 (H) 12/15/2018 1143   O2SAT 95.5 12/15/2018 1143   CBG (last 3)  No results for input(s): GLUCAP in the last 72 hours.  Assessment/Plan: S/P Procedure(s) (LRB): VIDEO ASSISTED THORACOSCOPY AND RESECTION OF BLEBS RIGHT LOWER LOBE (Right) VIDEO BRONCHOSCOPY (Bilateral) Prolonged air leak  Has a small air leak with initial cough that resolves with repeated coughing. Will do a trial of clamping the chest tube. If he has any indication of difficulty will unclamp immediately. If fails clamping trial will convert to mini express   LOS: 26 days    [name] 01/04/2019

## 2019-01-05 ENCOUNTER — Inpatient Hospital Stay (HOSPITAL_COMMUNITY): Payer: Medicare Other

## 2019-01-05 MED ORDER — ENSURE ENLIVE PO LIQD: 237.0000 mL | Freq: Two times a day (BID) | ORAL | Status: DC

## 2019-01-05 NOTE — Progress Notes (Signed)
      [address]       [place] [address]             [phone]       Paged this evening about Mr. Kithcart and a worsening air leak/subcutaneous emphysema. Based on the notes, both the air leak and the subcutaneous air have been present. He is on a mini express and there are bubbles present. The patient had not had any increase in shortness of breath or pain. His oxygen has been weaned throughout the day from 4L to 3L.   We will continue to monitor closely and plan for a chest xray in the morning. If the chest xray is stable, he could potentially go home. Discussed with Dr. Roxan Hockey.  I informed the patient that if he does have increased pain or sudden shortness of breath he is to tell nursing immediately and we can assess with a chest xray at that time.    Nicholes Rough, PA-C

## 2019-01-05 NOTE — Progress Notes (Addendum)
      [address]       [place] [address]             [phone]      15 Days Post-Op Procedure(s) (LRB): VIDEO ASSISTED THORACOSCOPY AND RESECTION OF BLEBS RIGHT LOWER LOBE (Right) VIDEO BRONCHOSCOPY (Bilateral)   Subjective:  No new complaints.  Starting to get frustrated and asking what we will do if his lung cant tolerate current box.  Objective: Vital signs in last 24 hours: Temp:  [97.6 F (36.4 C)-98.5 F (36.9 C)] 97.8 F (36.6 C) (04/07 0452) Pulse Rate:  [75-78] 76 (04/07 0506) Cardiac Rhythm: Normal sinus rhythm (04/06 2332) Resp:  [16-23] 23 (04/07 0506) BP: (119-136)/(65-72) 136/65 (04/07 0452) SpO2:  [91 %-98 %] 91 % (04/07 0506) FiO2 (%):  [32 %-36 %] 36 % (04/06 1704)  Intake/Output from previous day: 04/06 0701 - 04/07 0700 In: 610 [P.O.:600] Out: 300 [Urine:300]  General appearance: alert, cooperative and no distress Heart: regular rate and rhythm Lungs: wheezes bilaterally Abdomen: soft, non-tender; bowel sounds normal; no masses,  no organomegaly Extremities: extremities normal, atraumatic, no cyanosis or edema Wound: clean and dry  Lab Results: No results for input(s): WBC, HGB, HCT, PLT in the last 72 hours. BMET: No results for input(s): NA, K, CL, CO2, GLUCOSE, BUN, CREATININE, CALCIUM in the last 72 hours.  PT/INR: No results for input(s): LABPROT, INR in the last 72 hours. ABG    Component Value Date/Time   PHART 7.462 (H) 12/15/2018 1143   HCO3 30.0 (H) 12/15/2018 1143   O2SAT 95.5 12/15/2018 1143   CBG (last 3)  No results for input(s): GLUCAP in the last 72 hours.  Assessment/Plan: S/P Procedure(s) (LRB): VIDEO ASSISTED THORACOSCOPY AND RESECTION OF BLEBS RIGHT LOWER LOBE (Right) VIDEO BRONCHOSCOPY (Bilateral)  1. Chest tube- transitioned to mini express yesterday, continued air leak... CXR shows increase in pneumothorax, + sub q emphysema present 2. Pulm- severe bullous emphysema, continue aggressive  pulmonary toilet 3. CV- hemodynamically stable, continue Toprol, Cardizem, Clonidine 4. Lovenox for DVT  5. Dispo- patient placed on Mini Express yesterday, pneumothorax appears increased on CXR this morning. + air leak on Mini Express, defer to Dr. Roxan Hockey for further chest tube management   LOS: 27 days    Ellwood Handler 01/05/2019 Patient seen and examined, agree with above Will leave on mini-xpress Possibly home tomorrow Will need home O2 initially  Remo Lipps C. Roxan Hockey, MD [facility] [phone]

## 2019-01-05 NOTE — Progress Notes (Signed)
Pt refused to walk in the afternoon, because he already walked around his room. Checked sub emphysema which was lower back site toward Rt. Flank area in the morning was getting bigger upper chest area. PA Tessa notified this matter and she will assess patient. HS [employer]

## 2019-01-05 NOTE — Progress Notes (Signed)
Nutrition Follow-up  RD working remotely.  INTERVENTION:   - Ensure Enlive po BID, each supplement provides 350 kcal and 20 grams of protein  - Encourage adequate PO intake  NUTRITION DIAGNOSIS:   Increased nutrient needs related to post-op healing as evidenced by estimated needs.  Ongoing, being addressed via oral nutrition supplements  GOAL:   Patient will meet greater than or equal to 90% of their needs  Progressing  MONITOR:   PO intake, Supplement acceptance, Weight trends, Labs  REASON FOR ASSESSMENT:   LOS    ASSESSMENT:   68 yo male, admitted VATS/repair R air leak. PMH significant for h/o VATS/L lobectomy, COPD, stage 1A lung CA, HTN, PAF, tobacco abuse.  CT on mini express yesterday with continued air leak. Chest x-ray showing increase in pneumothorax. Per CVTS, possibly d/c home tomorrow.  No new weight since 4/01.  Attempted to speak with pt via phone call to room but no answer.  Will decrease Ensure Enlive to BID given good PO intake and pt refusal of Ensure Enlive ~50% of the times offered per [facility].  Meal Completion: 20-100% x 5 meals (averaging 82%)  Medications reviewed and include: Dulcolax, Ensure Enlive TID (pt refusing ~50%), Protonix, Miralax, Senna  Labs reviewed. No new labs since 4/02.  UOP: 300 ml x 24 hours CT: 0 ml x 24 hours I/O's: -2.0 L since admit  Diet Order:   Diet Order            Diet Heart Room service appropriate? Yes; Fluid consistency: Thin  Diet effective now              EDUCATION NEEDS:   No education needs have been identified at this time  Skin:  Skin Assessment: Skin Integrity Issues: Incisions: right chest  Last BM:  01/03/19  Height:   Ht Readings from Last 1 Encounters:  12/21/18 5\' 10"  (1.778 m)    Weight:   Wt Readings from Last 1 Encounters:  12/30/18 79 kg    Ideal Body Weight:  75.5 kg  BMI:  Body mass index is 24.99 kg/m.  Estimated Nutritional Needs:   Kcal:  2350-2820  (25-30 kcal/kg)  Protein:  113-141 g (1.2-1.5 g/kg)  Fluid:  1 mL/kcal or per MD    Gaynell Face, MS, RD, LDN Inpatient Clinical Dietitian Pager: [phone] Weekend/After Hours: [phone]

## 2019-01-06 ENCOUNTER — Inpatient Hospital Stay (HOSPITAL_COMMUNITY): Payer: Medicare Other

## 2019-01-06 MED ORDER — DILTIAZEM HCL ER 60 MG PO CP12: 60.0000 mg | ORAL_CAPSULE | Freq: Two times a day (BID) | ORAL | 3 refills | Status: DC

## 2019-01-06 MED ORDER — LIDOCAINE HCL (PF) 1 % IJ SOLN
INTRAMUSCULAR | Status: AC
  Filled 2019-01-06: qty 5

## 2019-01-06 MED ORDER — GUAIFENESIN ER 600 MG PO TB12: 600.0000 mg | ORAL_TABLET | Freq: Two times a day (BID) | ORAL | 3 refills | Status: DC | PRN

## 2019-01-06 MED ORDER — CLONIDINE HCL 0.1 MG PO TABS: 0.1000 mg | ORAL_TABLET | Freq: Every day | ORAL | 3 refills | Status: DC

## 2019-01-06 MED ORDER — LIDOCAINE HCL (PF) 1 % IJ SOLN
5.0000 mL | Freq: Once | INTRAMUSCULAR | Status: AC
  Administered 2019-01-06: 09:00:00 5 mL via INTRADERMAL

## 2019-01-06 MED ORDER — OXYCODONE HCL 5 MG PO TABS: 5.0000 mg | ORAL_TABLET | ORAL | 0 refills | Status: DC | PRN

## 2019-01-06 NOTE — TOC Transition Note (Signed)
Transition of Care [facility]) - CM/SW Discharge Note   Patient Details  Name: Randall Sullivan MRN: [id number] Date of Birth: 03/27/1951  Transition of Care [facility]) CM/SW Contact:  Maryclare Labrador, RN Phone Number: 01/06/2019, 11:54 AM   Clinical Narrative:    Pt to discharge home today.  [facility] and [facility] aware that pt will discharge home today.  Pt given choice for home oxygen - pt chose [facility] - referral accepted.     Final next level of care: [place] Barriers to Discharge: Barriers Resolved   Patient Goals and CMS Choice Patient states their goals for this hospitalization and ongoing recovery are:: (Pt states he simply wants to stay out of the hospital and not get sick) CMS Medicare.gov Compare Post Acute Care list provided to:: Patient Choice offered to / list presented to : Patient  Discharge Placement                       Discharge Plan and Services In-house Referral: [facility](for prescription assistance and Medicaid application) Discharge Planning Services: CM Consult Post Acute Care Choice: Home Health, Durable Medical Equipment          DME Arranged: Oxygen DME Agency: [facility] HH Arranged: RN [place] Agency: [place] ([facility])   Social Determinants of Health (SDOH) Interventions     Readmission Risk Interventions Readmission Risk Prevention Plan 12/27/2018 12/27/2018  Transportation Screening - Complete  PCP or Specialist Appt within 5-7 Days - Complete  Home Care Screening Complete -  Medication Review (RN CM) Complete -  Some recent data might be hidden

## 2019-01-06 NOTE — Progress Notes (Signed)
SATURATION QUALIFICATIONS: (This note is used to comply with regulatory documentation for home oxygen)  Patient Saturations on Room Air at Rest = 84%  Patient SpO2 on 3LPM at rest= 94%  Patient Saturations on 3 LPM while Ambulating = 72%  Patient Saturations on 6 Liters of oxygen while Ambulating = 92 %

## 2019-01-06 NOTE — Discharge Instructions (Signed)
Video-Assisted Thoracic Surgery, Care After °This sheet gives you information about how to care for yourself after your procedure. Your health care provider may also give you more specific instructions. If you have problems or questions, contact your health care provider. °What can I expect after the procedure? °After the procedure, it is common to have: °· Some pain and soreness in your chest. °· Pain when breathing in (inhaling) and coughing. °· Constipation. °· Fatigue. °· Difficulty sleeping. °Follow these instructions at home: °Preventing pneumonia °· Take deep breaths or do breathing exercises as instructed by your health care provider. Doing this helps prevent lung infection (pneumonia). °· Cough frequently. Coughing may cause discomfort, but it is important to clear mucus (phlegm) and expand your lungs. If it hurts to cough, hold a pillow against your chest or place the palms of both hands on top of the incision (use splinting) when you cough. This may help relieve discomfort. °· If you were given an incentive spirometer, use it as directed. An incentive spirometer is a tool that measures how well you are filling your lungs with each breath. °· Participate in pulmonary rehabilitation as directed by your health care provider. This is a program that combines education, exercise, and support from a team of specialists. The goal is to help you heal and get back to your normal activities as soon as possible. °Medicines °· Take over-the-counter or prescription medicines only as told by your health care provider. °· If you have pain, take pain-relieving medicine before your pain becomes severe. This is important because if your pain is under control, you will be able to breathe and cough more comfortably. °· If you were prescribed an antibiotic medicine, take it as told by your health care provider. Do not stop taking the antibiotic even if you start to feel better. °Activity °· Ask your health care provider what  activities are safe for you. °· Avoid activities that use your chest muscles for at least 3-4 weeks. °· Do not lift anything that is heavier than 10 lb (4.5 kg), or the limit that your health care provider tells you, until he or she says that it is safe. °Incision care °· Follow instructions from your health care provider about how to take care of your incision(s). Make sure you: °? Wash your hands with soap and water before you change your bandage (dressing). If soap and water are not available, use hand sanitizer. °? Change your dressing as told by your health care provider. °? Leave stitches (sutures), skin glue, or adhesive strips in place. These skin closures may need to stay in place for 2 weeks or longer. If adhesive strip edges start to loosen and curl up, you may trim the loose edges. Do not remove adhesive strips completely unless your health care provider tells you to do that. °· Keep your dressing dry until it has been removed. °· Check your incision area every day for signs of infection. Check for: °? Redness, swelling, or pain. °? Fluid or blood. °? Warmth. °? Pus or a bad smell. °Bathing °· Do not take baths, swim, or use a hot tub until your health care provider approves. You may take showers. °· After your dressing has been removed, use soap and water to gently wash your incision area. Do not use anything else to clean your incision(s) unless your health care provider tells you to do this. °Driving ° °· Do not drive until your health care provider approves. °· Do not drive or   use heavy machinery while taking prescription pain medicine. °Eating and drinking °· Eat a healthy, balanced diet as instructed by your health care provider. A healthy diet includes plenty of fresh fruits and vegetables, whole grains, and low-fat (lean) proteins. °· Limit foods that are high in fat and processed sugars, such as fried and sweet foods. °· Drink enough fluid to keep your urine clear or pale yellow. °General  instructions ° °· To prevent or treat constipation while you are taking prescription pain medicine, your health care provider may recommend that you: °? Take over-the-counter or prescription medicines. °? Eat foods that are high in fiber, such as beans, fresh fruits and vegetables, and whole grains. °· Do not use any products that contain nicotine or tobacco, such as cigarettes and e-cigarettes. If you need help quitting, ask your health care provider. °· Avoid secondhand smoke. °· Wear compression stockings as told by your health care provider. These stockings help to prevent blood clots and reduce swelling in your legs. °· If you have a chest tube, care for it as instructed by your health care provider. Do not travel by airplane during the 2 weeks after your chest tube is removed, or until your health care provider says that this is safe. °· Keep all follow-up visits as told by your health care provider. This is important. °Contact a health care provider if: °· You have redness, swelling, or pain around an incision. °· You have fluid or blood coming from an incision. °· Your incision area feels warm to the touch. °· You have pus or a bad smell coming from an incision. °· You have a fever or chills. °· You have nausea or vomiting. °· You have pain that does not get better with medicine. °Get help right away if: °· You have chest pain. °· Your heart is fluttering or beating rapidly. °· You develop a rash. °· You have shortness of breath or trouble breathing. °· You are confused. °· You have trouble speaking. °· You feel weak, light-headed, or dizzy. °· You faint. °Summary °· To help prevent lung infection (pneumonia), take deep breaths or do breathing exercises as instructed by your health care provider. °· Cough frequently to clear mucus (phlegm) and expand your lungs. If it hurts to cough, hold a pillow against your chest or place the palms of both hands on top of the incision (use splinting) when you cough. °· If  you have pain, take pain-relieving medicine before your pain becomes severe. This is important because if your pain is under control, you will be able to breathe and cough more comfortably. °· Ask your health care provider what activities are safe for you. °This information is not intended to replace advice given to you by your health care provider. Make sure you discuss any questions you have with your health care provider. °Document Released: 01/11/2013 Document Revised: 08/26/2016 Document Reviewed: 08/26/2016 °Elsevier Interactive Patient Education © 2019 [employer] Inc. ° °

## 2019-01-06 NOTE — Progress Notes (Signed)
      [address]       [place] [address]             [phone]      16 Days Post-Op Procedure(s) (LRB): VIDEO ASSISTED THORACOSCOPY AND RESECTION OF BLEBS RIGHT LOWER LOBE (Right) VIDEO BRONCHOSCOPY (Bilateral)   Subjective:  No new complaints.  Nursing was concerned about air leak and sub q emphysema yesterday afternoon.  However, this appears stable on exam.  + ambulation  + BM  Objective: Vital signs in last 24 hours: Temp:  [97.5 F (36.4 C)-99.3 F (37.4 C)] 98 F (36.7 C) (04/08 0742) Pulse Rate:  [60-84] 71 (04/08 0300) Cardiac Rhythm: Normal sinus rhythm (04/07 2000) Resp:  [16-23] 23 (04/08 0742) BP: (107-139)/(64-85) 119/64 (04/08 0742) SpO2:  [95 %-100 %] 98 % (04/08 0731)  Intake/Output from previous day: 04/07 0701 - 04/08 0700 In: 547 [P.O.:547] Out: 150 [Urine:150]  General appearance: alert, cooperative and no distress Heart: regular rate and rhythm Lungs: clear to auscultation bilaterally Abdomen: soft, non-tender; bowel sounds normal; no masses,  no organomegaly Extremities: extremities normal, atraumatic, no cyanosis or edema Wound: clean and dry  Lab Results: No results for input(s): WBC, HGB, HCT, PLT in the last 72 hours. BMET: No results for input(s): NA, K, CL, CO2, GLUCOSE, BUN, CREATININE, CALCIUM in the last 72 hours.  PT/INR: No results for input(s): LABPROT, INR in the last 72 hours. ABG    Component Value Date/Time   PHART 7.462 (H) 12/15/2018 1143   HCO3 30.0 (H) 12/15/2018 1143   O2SAT 95.5 12/15/2018 1143   CBG (last 3)  No results for input(s): GLUCAP in the last 72 hours.  Assessment/Plan: S/P Procedure(s) (LRB): VIDEO ASSISTED THORACOSCOPY AND RESECTION OF BLEBS RIGHT LOWER LOBE (Right) VIDEO BRONCHOSCOPY (Bilateral)  1. Chest tube- persistent air leak, minimal drainage which is new, green in color- remain on Mini Express 2. Pulm- CXR appears stable, no increase in pneumothorax, sub q emphysema  appears stable 3. CV- hemodynamically stable, continue clonidine, toprol xl, cardizem 4. Dispo- patient stable, home oxygen has been ordered,, home health orders have been arranged, will await Dr.  Leonarda Salon recommendations, for possible d/c home today   LOS: 28 days    Ellwood Handler 01/06/2019

## 2019-01-06 NOTE — Plan of Care (Addendum)
  Problem: Activity: Goal: Risk for activity intolerance will decrease Outcome: Adequate for Discharge   Pt ambulated with RN 350 feet and walker and oxygen, tolerated well. RN offered to get pt walker for stability at home use, pt declined. Pt has oxygen tank at bedside for home use. Needs 6 LPM when ambulating and 3LPM at rest. Educated on SOB and slowly increasing activity. Pt given DC packet and educated on site s/s, restrictions, new medication regimen, and follow up appointments. Educated on dressing changed and given supplies-xeroform, 4x4 and medipore tape. Questions answered. VSS. PIV DC, hemostasis achieved. Pt awaiting ride by spouse.    Pt escorted by NT via wheelchair to private vehicle drive by spouse. All belongings sent home with patient.

## 2019-01-06 NOTE — Progress Notes (Addendum)
Post discharge.   RN received call from [place], spouse, regarding home oxygen not arrived. Also, [facility] unable to fill cardizem Rx until after 2pm 4/9 (pt would miss two doses).   RN confirmed with Aldona Bar, RN CM, concentrator will arrive tonight by 9pm.   RN called [facility] in [place] to transfer cardizem 60mg  BID prescription to be filled today for family to pick up. Pt needs to take second dose this evening.

## 2019-01-07 DIAGNOSIS — Z438 Encounter for attention to other artificial openings: Secondary | ICD-10-CM | POA: Diagnosis not present

## 2019-01-07 DIAGNOSIS — J439 Emphysema, unspecified: Secondary | ICD-10-CM | POA: Diagnosis not present

## 2019-01-07 DIAGNOSIS — Z9981 Dependence on supplemental oxygen: Secondary | ICD-10-CM | POA: Diagnosis not present

## 2019-01-07 DIAGNOSIS — J9382 Other air leak: Secondary | ICD-10-CM | POA: Diagnosis not present

## 2019-01-09 DIAGNOSIS — Z438 Encounter for attention to other artificial openings: Secondary | ICD-10-CM | POA: Diagnosis not present

## 2019-01-09 DIAGNOSIS — Z9981 Dependence on supplemental oxygen: Secondary | ICD-10-CM | POA: Diagnosis not present

## 2019-01-09 DIAGNOSIS — J439 Emphysema, unspecified: Secondary | ICD-10-CM | POA: Diagnosis not present

## 2019-01-09 DIAGNOSIS — J9382 Other air leak: Secondary | ICD-10-CM | POA: Diagnosis not present

## 2019-01-11 ENCOUNTER — Other Ambulatory Visit: Payer: Self-pay

## 2019-01-11 ENCOUNTER — Other Ambulatory Visit: Payer: Self-pay | Admitting: Thoracic Surgery (Cardiothoracic Vascular Surgery)

## 2019-01-11 DIAGNOSIS — Z9981 Dependence on supplemental oxygen: Secondary | ICD-10-CM | POA: Diagnosis not present

## 2019-01-11 DIAGNOSIS — J9382 Other air leak: Secondary | ICD-10-CM | POA: Diagnosis not present

## 2019-01-11 DIAGNOSIS — C3490 Malignant neoplasm of unspecified part of unspecified bronchus or lung: Secondary | ICD-10-CM

## 2019-01-11 DIAGNOSIS — Z438 Encounter for attention to other artificial openings: Secondary | ICD-10-CM | POA: Diagnosis not present

## 2019-01-11 DIAGNOSIS — J439 Emphysema, unspecified: Secondary | ICD-10-CM | POA: Diagnosis not present

## 2019-01-12 ENCOUNTER — Ambulatory Visit (INDEPENDENT_AMBULATORY_CARE_PROVIDER_SITE_OTHER): Payer: Self-pay | Admitting: Thoracic Surgery (Cardiothoracic Vascular Surgery)

## 2019-01-12 ENCOUNTER — Encounter: Payer: Self-pay | Admitting: Thoracic Surgery (Cardiothoracic Vascular Surgery)

## 2019-01-12 ENCOUNTER — Ambulatory Visit
Admission: RE | Admit: 2019-01-12 | Discharge: 2019-01-12 | Disposition: A | Discharge status: Home / Self Care | Payer: Medicare Other | Source: Ambulatory Visit | Attending: Thoracic Surgery (Cardiothoracic Vascular Surgery) | Admitting: Thoracic Surgery (Cardiothoracic Vascular Surgery)

## 2019-01-12 VITALS — BP 110/70 | HR 77 | Temp 96.9°F | Resp 18 | Ht 70.0 in | Wt 187.0 lb

## 2019-01-12 DIAGNOSIS — C3490 Malignant neoplasm of unspecified part of unspecified bronchus or lung: Secondary | ICD-10-CM

## 2019-01-12 DIAGNOSIS — Z902 Acquired absence of lung [part of]: Secondary | ICD-10-CM

## 2019-01-12 MED ORDER — OXYCODONE HCL 5 MG PO TABS: 5.0000 mg | ORAL_TABLET | Freq: Four times a day (QID) | ORAL | 0 refills | Status: DC | PRN

## 2019-01-12 NOTE — Progress Notes (Signed)
[address]       [place] [address]             [phone]       HPI: Mr. Tavano returns for a scheduled follow-up visit  Valerie Fredin is a 68 year old man with a history of tobacco abuse, COPD, stage I lung cancer treated with left upper lobectomy in 2017, paroxysmal atrial fibrillation, and arthritis.  He was found to have a new right upper lobe lung nodule on a CT in December.  This nodule was mildly hypermetabolic.  We discussed surgical resection versus radiation.  He opted for surgical resection.  I did a wedge resection and bullectomy on 12/09/2018.  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  Postoperatively he had a prolonged air leak.  I took him back and placed IBV in the upper lobe segmental bronchi in the superior segmental bronchus of the lower lobe.  His air leak improved initially but after a few days it was about as bad as it had been previously.  I did a redo VATS on 12/21/2011.  It turned out he had ruptured bleb in the superior segment of the right lower lobe.  That was stapled.  The air leak was dramatically improved after that but never resolved entirely.  He went home with a chest tube in place on 01/06/2019.  He does have some pain from the chest tube.  He is taking Percocet couple of times a day.  He is not having any fevers or chills.  He has not had any significant respiratory issues he remains on 3 L nasal cannula oxygen.  Past Medical History:  Diagnosis Date  . A-fib ([place]) 2017   after last lung surgery patient had a history if Afib documented in hospital   . Arthritis    back & legs ( knees)  . Complication of anesthesia    agitated upon waking from anesth.   . Emphysema   . History of lung cancer   . Hypertension   . Tobacco abuse     Current Outpatient Medications  Medication Sig Dispense Refill  . acetaminophen (TYLENOL) 500 MG tablet Take 1,000 mg by mouth every 6 (six) hours as needed (for arthritis pain.).      [name] albuterol (PROVENTIL) (2.5 MG/3ML) 0.083% nebulizer solution Take 2.5 mg by nebulization 4 (four) times daily.   5  . aspirin EC 81 MG tablet Take 81 mg by mouth daily.    . cloNIDine (CATAPRES) 0.1 MG tablet Take 1 tablet (0.1 mg total) by mouth daily. 30 tablet 3  . diltiazem (CARDIZEM SR) 60 MG 12 hr capsule Take 1 capsule (60 mg total) by mouth every 12 (twelve) hours. 60 capsule 3  . fexofenadine (ALLEGRA) 180 MG tablet Take 180 mg by mouth daily as needed for allergies or rhinitis.    [name] guaiFENesin (MUCINEX) 600 MG 12 hr tablet Take 1 tablet (600 mg total) by mouth 2 (two) times daily as needed. 60 tablet 3  . meclizine (ANTIVERT) 25 MG tablet Take 1 tablet (25 mg total) by mouth 3 (three) times daily as needed for dizziness. May cause drowsiness 30 tablet 0  . metoprolol succinate (TOPROL-XL) 50 MG 24 hr tablet Take 50 mg by mouth daily. Take with or immediately following a meal.    . oxyCODONE (OXY IR/ROXICODONE) 5 MG immediate release tablet Take 1-2 tablets (5-10 mg total) by mouth every 6 (six) hours as needed  for severe pain. 30 tablet 0   No current facility-administered medications for this visit.     Physical Exam BP 110/70 (BP Location: Right Arm, Patient Position: Sitting, Cuff Size: Normal)   Pulse 77   Temp (!) 96.9 F (36.1 C) (Oral)   Resp 18   Ht 5\' 10"  (1.778 m)   Wt 187 lb (84.8 kg)   SpO2 97% Comment: on 4L O2...3L O2 HS  BMI 26.27 kg/m  68 year old man in no acute distress Alert and oriented x3 with no focal deficits Lungs diminished bilaterally but no rales or wheezing Incision healing well, chest tube site with erythema consistent with local irritation Cardiac regular rate and rhythm  Diagnostic Tests: CHEST - 2 VIEW  COMPARISON:  January 06, 2019  FINDINGS: Postsurgical changes with suture lines are seen in the right apex. No definitive pneumothorax identified. Right chest tube remains in place. Air in the right lateral chest wall persists but is  decreased. Opacity in the right base is stable. Opacity in the left base is stable. A small right effusion remains. The cardiomediastinal silhouette is stable. No other interval changes.  IMPRESSION: 1. Postsurgical changes on the right are stable without definitive pneumothorax. 2. Persistent but decreasing air in the soft tissues of the right chest wall. 3. Persistent bibasilar opacities, right greater than left. Persistent small right effusion.   Electronically Signed   By: Dorise Bullion III M.D   On: 01/12/2019 13:31 I personally reviewed the chest x-ray images and concur with the findings noted above  Impression: Mr. Brumley is a 68 year old gentleman who underwent a wedge resection and bullectomy of the right upper lobe for a stage Ia adenocarcinoma and a giant bulla on 12/09/2018.  His postoperative course was complicated by a prolonged air leak, influenza, and hospital-acquired pneumonia.  I did bronchoscopy and IBV placement for the air leak which seemed to decrease the leak initially but then the leak again worsened.  Ultimately I took him back for redo right VATS and found a ruptured bleb in the superior segment of the right lower lobe.  The right upper lobe staple lines were healed.  After the second operation his air leak was much smaller, but did not completely resolved.  He failed a trial of clamping the tube.  He ultimately went home with the tube in place about a week ago.  On exam today it is difficult to determine if there is a true air leak.  There is dramatic movement of fluid with coughing as he has a very forceful cough.  I am not comfortable that the air leak has completely resolved.  I think we need to leave the tube in place for now.  He is doing well on 3 L nasal cannula oxygen.  Hopefully that can be weaned off over the next several weeks.  Plan: Return in 1 week with PA and lateral chest x-ray  [name], MD [facility]  [phone]

## 2019-01-13 ENCOUNTER — Other Ambulatory Visit: Payer: Self-pay | Admitting: Thoracic Surgery (Cardiothoracic Vascular Surgery)

## 2019-01-13 DIAGNOSIS — C3490 Malignant neoplasm of unspecified part of unspecified bronchus or lung: Secondary | ICD-10-CM

## 2019-01-14 ENCOUNTER — Telehealth: Payer: Self-pay | Admitting: Internal Medicine

## 2019-01-14 DIAGNOSIS — Z9981 Dependence on supplemental oxygen: Secondary | ICD-10-CM | POA: Diagnosis not present

## 2019-01-14 DIAGNOSIS — J9382 Other air leak: Secondary | ICD-10-CM | POA: Diagnosis not present

## 2019-01-14 DIAGNOSIS — Z438 Encounter for attention to other artificial openings: Secondary | ICD-10-CM | POA: Diagnosis not present

## 2019-01-14 DIAGNOSIS — J439 Emphysema, unspecified: Secondary | ICD-10-CM | POA: Diagnosis not present

## 2019-01-14 NOTE — Telephone Encounter (Signed)
Changed 4/29 to telephone visit per MD request. Called patient. No answer. Left msg explaining changes.

## 2019-01-15 DIAGNOSIS — Z438 Encounter for attention to other artificial openings: Secondary | ICD-10-CM | POA: Diagnosis not present

## 2019-01-15 DIAGNOSIS — J9382 Other air leak: Secondary | ICD-10-CM | POA: Diagnosis not present

## 2019-01-15 DIAGNOSIS — Z9981 Dependence on supplemental oxygen: Secondary | ICD-10-CM | POA: Diagnosis not present

## 2019-01-15 DIAGNOSIS — J439 Emphysema, unspecified: Secondary | ICD-10-CM | POA: Diagnosis not present

## 2019-01-18 ENCOUNTER — Other Ambulatory Visit: Payer: Self-pay

## 2019-01-18 DIAGNOSIS — Z9981 Dependence on supplemental oxygen: Secondary | ICD-10-CM | POA: Diagnosis not present

## 2019-01-18 DIAGNOSIS — Z438 Encounter for attention to other artificial openings: Secondary | ICD-10-CM | POA: Diagnosis not present

## 2019-01-18 DIAGNOSIS — J9382 Other air leak: Secondary | ICD-10-CM | POA: Diagnosis not present

## 2019-01-18 DIAGNOSIS — J439 Emphysema, unspecified: Secondary | ICD-10-CM | POA: Diagnosis not present

## 2019-01-19 ENCOUNTER — Ambulatory Visit: Payer: Medicare Other | Admitting: Thoracic Surgery (Cardiothoracic Vascular Surgery)

## 2019-01-19 ENCOUNTER — Ambulatory Visit
Admission: RE | Admit: 2019-01-19 | Discharge: 2019-01-19 | Disposition: A | Discharge status: Home / Self Care | Payer: Medicare Other | Source: Ambulatory Visit | Attending: Thoracic Surgery (Cardiothoracic Vascular Surgery) | Admitting: Thoracic Surgery (Cardiothoracic Vascular Surgery)

## 2019-01-19 ENCOUNTER — Ambulatory Visit (INDEPENDENT_AMBULATORY_CARE_PROVIDER_SITE_OTHER): Payer: Self-pay | Admitting: Thoracic Surgery (Cardiothoracic Vascular Surgery)

## 2019-01-19 ENCOUNTER — Encounter: Payer: Self-pay | Admitting: Thoracic Surgery (Cardiothoracic Vascular Surgery)

## 2019-01-19 VITALS — BP 106/68 | HR 85 | Resp 20 | Ht 70.0 in | Wt 197.0 lb

## 2019-01-19 DIAGNOSIS — C3490 Malignant neoplasm of unspecified part of unspecified bronchus or lung: Secondary | ICD-10-CM

## 2019-01-19 DIAGNOSIS — IMO0002 Reserved for concepts with insufficient information to code with codable children: Secondary | ICD-10-CM

## 2019-01-19 DIAGNOSIS — Z902 Acquired absence of lung [part of]: Secondary | ICD-10-CM

## 2019-01-19 DIAGNOSIS — J9382 Other air leak: Secondary | ICD-10-CM

## 2019-01-19 NOTE — Progress Notes (Signed)
[address]       [place], [address]             [phone]      HPI: [name] returns for a scheduled follow-up visit  Randall Sullivan is a 68 year old gentleman who recently had VATS for blebectomy and resection of a lung nodule in the upper lobe which turned out to be a stage Ia adenocarcinoma.  That was on 12/09/2018.  He had a large air leak postoperatively.  IBV were placed in his air leak improved transiently but then worsened again.  I took him back to the OR on 12/21/2011.  He had ruptured a bleb in the superior segment of his lower lobe, which accounted for the unusual air leak.  Postoperatively he continued to have an air leak and went home with a chest tube on 01/06/2019.  I saw him in the office last week.  He still had a leak at that time.  He was otherwise doing well.  Today he is doing well.  He still hears gurgling noises from his chest on occasion.  He is not having any shortness of breath.  He is still using oxygen.  He has been social distance and.  Past Medical History:  Diagnosis Date  . A-fib ([place]) 2017   after last lung surgery patient had a history if Afib documented in hospital   . Arthritis    back & legs ( knees)  . Complication of anesthesia    agitated upon waking from anesth.   . Emphysema   . History of lung cancer   . Hypertension   . Tobacco abuse     Current Outpatient Medications  Medication Sig Dispense Refill  . acetaminophen (TYLENOL) 500 MG tablet Take 1,000 mg by mouth every 6 (six) hours as needed (for arthritis pain.).     [name] albuterol (PROVENTIL) (2.5 MG/3ML) 0.083% nebulizer solution Take 2.5 mg by nebulization 4 (four) times daily.   5  . aspirin EC 81 MG tablet Take 81 mg by mouth daily.    . cloNIDine (CATAPRES) 0.1 MG tablet Take 1 tablet (0.1 mg total) by mouth daily. 30 tablet 3  . diltiazem (CARDIZEM SR) 60 MG 12 hr capsule Take 1 capsule (60 mg total) by mouth every 12 (twelve) hours. 60 capsule 3  .  fexofenadine (ALLEGRA) 180 MG tablet Take 180 mg by mouth daily as needed for allergies or rhinitis.    [name] guaiFENesin (MUCINEX) 600 MG 12 hr tablet Take 1 tablet (600 mg total) by mouth 2 (two) times daily as needed. 60 tablet 3  . meclizine (ANTIVERT) 25 MG tablet Take 1 tablet (25 mg total) by mouth 3 (three) times daily as needed for dizziness. May cause drowsiness 30 tablet 0  . metoprolol succinate (TOPROL-XL) 50 MG 24 hr tablet Take 50 mg by mouth daily. Take with or immediately following a meal.    . oxyCODONE (OXY IR/ROXICODONE) 5 MG immediate release tablet Take 1-2 tablets (5-10 mg total) by mouth every 6 (six) hours as needed for severe pain. 30 tablet 0   No current facility-administered medications for this visit.     Physical Exam BP 106/68 (BP Location: Right Arm, Patient Position: Sitting, Cuff Size: Normal)   Pulse 85   Resp 20   Ht 5\' 10"  (1.778 m)   Wt 197 lb (89.4 kg)   SpO2 98% Comment: ON 3L O2  BMI 28.93 kg/m  68 year old man in  no acute distress Alert and oriented x3 with no focal deficits Incision well-healed, mild erythema at chest tube site Positive extreme tidal variation and small air leak with cough  Diagnostic Tests: CHEST - 2 VIEW  COMPARISON:  01/12/2019  FINDINGS: A right chest tube is unchanged. Postoperative changes are again seen in the right lung with bronchial valves in the right hilum. The cardiomediastinal silhouette is unchanged with normal heart size. A small right pleural effusion is unchanged. A small right apical pneumothorax is questioned. Coarse right greater than left lower lung opacities are unchanged. Soft tissue emphysema in the right lateral chest wall has decreased. No acute osseous abnormality is seen.  IMPRESSION: 1. Unchanged positioning of right chest tube with possible small right apical pneumothorax. 2. Decreased soft tissue emphysema in the right chest wall. 3. Unchanged small right pleural effusion and right  greater than left basilar lung opacities.   Electronically Signed   By: Logan Bores M.D.   On: 01/19/2019 12:33 I personally reviewed the chest x-ray images and concur with the findings noted above.  Essentially unchanged from last week study.  Impression: Randall Sullivan is a 68 year old man with a prolonged air leak after VATS and redo VATS.  He is currently stable at home with a chest tube in place.  He does have IBV that were placed about 5 weeks ago.  Those will need to come out within the next 2 to 3 weeks.  We may need to consider something along the lines of talc pleurodesis when we remove those.  We will discuss that further when he returns next week.  Plan: Return in 1 week with PA and lateral chest x-ray  [name], MD [facility] [phone]

## 2019-01-20 ENCOUNTER — Telehealth: Payer: Self-pay | Admitting: Internal Medicine

## 2019-01-20 NOTE — Telephone Encounter (Signed)
R/s appt per 4/22 sch message - pt wife aware of appt date and time  Reminder letter sent in the mail

## 2019-01-25 ENCOUNTER — Inpatient Hospital Stay: Payer: Medicare Other

## 2019-01-25 ENCOUNTER — Other Ambulatory Visit: Payer: Self-pay | Admitting: Thoracic Surgery (Cardiothoracic Vascular Surgery)

## 2019-01-25 DIAGNOSIS — C3492 Malignant neoplasm of unspecified part of left bronchus or lung: Secondary | ICD-10-CM

## 2019-01-26 ENCOUNTER — Other Ambulatory Visit: Payer: Self-pay | Admitting: Thoracic Surgery (Cardiothoracic Vascular Surgery)

## 2019-01-26 ENCOUNTER — Ambulatory Visit
Admission: RE | Admit: 2019-01-26 | Discharge: 2019-01-26 | Disposition: A | Discharge status: Home / Self Care | Payer: Medicare Other | Source: Ambulatory Visit | Attending: Thoracic Surgery (Cardiothoracic Vascular Surgery) | Admitting: Thoracic Surgery (Cardiothoracic Vascular Surgery)

## 2019-01-26 ENCOUNTER — Other Ambulatory Visit: Payer: Self-pay

## 2019-01-26 ENCOUNTER — Other Ambulatory Visit: Payer: Self-pay | Admitting: *Deleted

## 2019-01-26 ENCOUNTER — Encounter: Payer: Self-pay | Admitting: Thoracic Surgery (Cardiothoracic Vascular Surgery)

## 2019-01-26 ENCOUNTER — Ambulatory Visit (INDEPENDENT_AMBULATORY_CARE_PROVIDER_SITE_OTHER): Payer: Self-pay | Admitting: Thoracic Surgery (Cardiothoracic Vascular Surgery)

## 2019-01-26 ENCOUNTER — Telehealth: Payer: Self-pay | Admitting: Internal Medicine

## 2019-01-26 VITALS — BP 118/72 | HR 72 | Temp 96.5°F | Resp 20 | Ht 70.0 in | Wt 197.0 lb

## 2019-01-26 DIAGNOSIS — C3492 Malignant neoplasm of unspecified part of left bronchus or lung: Secondary | ICD-10-CM

## 2019-01-26 DIAGNOSIS — J9382 Other air leak: Secondary | ICD-10-CM

## 2019-01-26 DIAGNOSIS — IMO0002 Reserved for concepts with insufficient information to code with codable children: Secondary | ICD-10-CM

## 2019-01-26 DIAGNOSIS — J9811 Atelectasis: Secondary | ICD-10-CM | POA: Diagnosis not present

## 2019-01-26 DIAGNOSIS — R05 Cough: Secondary | ICD-10-CM | POA: Diagnosis not present

## 2019-01-26 DIAGNOSIS — Z902 Acquired absence of lung [part of]: Secondary | ICD-10-CM

## 2019-01-26 MED ORDER — AZITHROMYCIN 250 MG PO TABS: ORAL_TABLET | ORAL | 0 refills | Status: AC

## 2019-01-26 NOTE — Progress Notes (Signed)
[address]       [place] [address]             [phone]       HPI: [name] returns for scheduled follow-up visit  Brevyn Ring is a 68 year old man who had a VATS for blebectomy and wedge resection of a nodule in the left upper lobe which was a stage Ia adenocarcinoma on 12/09/2018.  He had a large air leak postoperatively.  IBV were placed which improved air leak transiently but then it worsened again.  He had redo VATS on 12/21/2018.  He had ruptured a bleb in the superior segment of his lower lobe.  He continued to have an air leak postoperatively and went home with a chest tube on 01/06/2019.  I have been seeing him weekly since then.  Last week I saw him.  He still had an air leak.  He was not having any shortness of breath.  In the interim since his last visit he thinks he might of had a fever last week.  Otherwise he has been feeling well.  He is having less pain than he was previously.  He has not had any acute respiratory problems.  Past Medical History:  Diagnosis Date  . A-fib ([place]) 2017   after last lung surgery patient had a history if Afib documented in hospital   . Arthritis    back & legs ( knees)  . Complication of anesthesia    agitated upon waking from anesth.   . Emphysema   . History of lung cancer   . Hypertension   . Tobacco abuse     Current Outpatient Medications  Medication Sig Dispense Refill  . acetaminophen (TYLENOL) 500 MG tablet Take 1,000 mg by mouth every 6 (six) hours as needed (for arthritis pain.).     [name] albuterol (PROVENTIL) (2.5 MG/3ML) 0.083% nebulizer solution Take 2.5 mg by nebulization 4 (four) times daily.   5  . aspirin EC 81 MG tablet Take 81 mg by mouth daily.    . cloNIDine (CATAPRES) 0.1 MG tablet Take 1 tablet (0.1 mg total) by mouth daily. 30 tablet 3  . diltiazem (CARDIZEM SR) 60 MG 12 hr capsule Take 1 capsule (60 mg total) by mouth every 12 (twelve) hours. 60 capsule 3  . fexofenadine (ALLEGRA) 180  MG tablet Take 180 mg by mouth daily as needed for allergies or rhinitis.    [name] guaiFENesin (MUCINEX) 600 MG 12 hr tablet Take 1 tablet (600 mg total) by mouth 2 (two) times daily as needed. 60 tablet 3  . meclizine (ANTIVERT) 25 MG tablet Take 1 tablet (25 mg total) by mouth 3 (three) times daily as needed for dizziness. May cause drowsiness 30 tablet 0  . metoprolol succinate (TOPROL-XL) 50 MG 24 hr tablet Take 50 mg by mouth daily. Take with or immediately following a meal.    . oxyCODONE (OXY IR/ROXICODONE) 5 MG immediate release tablet Take 1-2 tablets (5-10 mg total) by mouth every 6 (six) hours as needed for severe pain. 30 tablet 0   No current facility-administered medications for this visit.     Physical Exam BP 118/72 (BP Location: Left Arm, Patient Position: Sitting, Cuff Size: Large)   Pulse 72   Temp (!) 96.5 F (35.8 C) (Temporal)   Resp 20   Ht 5\' 10"  (1.778 m)   Wt 197 lb (89.4 kg)   SpO2 95% Comment: ON RA.....usually uses 2L  BMI 28.40 kg/m  68 year old man in no acute distress Alert and oriented x3 with no focal deficits Lungs clear bilaterally Chest tube in place.  Difficult to determine whether there is a true air leak or not.  Diagnostic Tests: CHEST - 2 VIEW  COMPARISON:  01/19/2019  FINDINGS: Right-sided chest tube. No pneumothorax. Rounded right lower lobe airspace opacity which may reflect pulmonary hemorrhage and/or pneumonia. Small right pleural effusion. Postsurgical changes in the right upper lobe. Mild left basilar airspace disease. Stable cardiomediastinal silhouette. No aggressive osseous lesion.  IMPRESSION: 1. Right-sided chest tube in unchanged position.  No pneumothorax. 2. Trace right pleural effusion with more focal right lower lobe airspace disease. Mild residual left basilar airspace disease.   Electronically Signed   By: Kathreen Devoid   On: 01/26/2019 11:18 I personally reviewed the chest x-ray and concur with the findings  noted above  Impression: Mr. Stocking is a 68 year old gentleman with a history of tobacco abuse and COPD. He had a previous left upper lobectomy for cancer a few years ago.  He presented with a new right upper lobe nodule.  I did a wedge resection of that along with the upper lobectomy.  The nodule turned out to be a stage Ia adenocarcinoma.  Postoperative air leak-refractory even after IBV placement.  It turned out he had ruptured a bleb in the superior segment of the lower lobe.  That was stapled in a second operation on 12/21/2018.  He has had a persistent air leak since then.  He has been at home with a tube in place.  It is very difficult to tell if he actually has an air leak.  He does have a lot of motion in the fluid in the mini express with coughing but not definite air leak.  However, I am also not confident enough to remove the tube just yet.  We will try clamping it and observe him and see how he does.  Plan: We will clamp tube and observe in the office.  If he remains stable we will get another chest x-ray. Will treat with zithromax for right lower lobe airspace opacity, possible pneumonia.  [name], MD [facility] [phone]  Mr. Zeck was observed in the office with his tube clamped for almost 2 hours and then sent for a chest x-ray.  The chest x-ray was not significantly changed with there may been some subtle changes superiorly.  He did not have any issues while the tube was clamped.  After unclamping the tube there was a small air bubble initially, but no ongoing definite leak.  I recommended to Mr. Studnicka that we leave his tube clamped and allow him to go home.  He was instructed on how to unclamp the tube if he has any increased pain, shortness of breath, or any signs of subcutaneous emphysema.  I will see him back in the office tomorrow with a repeat chest x-ray and determine whether we can safely remove the chest tube.  Revonda Standard  Roxan Hockey, MD [facility] [phone]

## 2019-01-26 NOTE — Telephone Encounter (Signed)
Moved lab appt for CT scan per sch msg. Called and spoke with patient. Confirmed date and time

## 2019-01-27 ENCOUNTER — Other Ambulatory Visit: Payer: Self-pay | Admitting: *Deleted

## 2019-01-27 ENCOUNTER — Ambulatory Visit: Payer: Medicare Other | Admitting: Internal Medicine

## 2019-01-27 ENCOUNTER — Ambulatory Visit
Admission: RE | Admit: 2019-01-27 | Discharge: 2019-01-27 | Disposition: A | Discharge status: Home / Self Care | Payer: Medicare Other | Source: Ambulatory Visit | Attending: Thoracic Surgery (Cardiothoracic Vascular Surgery) | Admitting: Thoracic Surgery (Cardiothoracic Vascular Surgery)

## 2019-01-27 ENCOUNTER — Ambulatory Visit (INDEPENDENT_AMBULATORY_CARE_PROVIDER_SITE_OTHER): Payer: Medicare Other | Admitting: Physician Assistant

## 2019-01-27 ENCOUNTER — Other Ambulatory Visit: Payer: Self-pay | Admitting: Thoracic Surgery (Cardiothoracic Vascular Surgery)

## 2019-01-27 VITALS — BP 100/64 | HR 76 | Temp 97.6°F | Resp 18 | Ht 70.0 in | Wt 197.0 lb

## 2019-01-27 DIAGNOSIS — Z9689 Presence of other specified functional implants: Secondary | ICD-10-CM

## 2019-01-27 DIAGNOSIS — J9811 Atelectasis: Secondary | ICD-10-CM | POA: Diagnosis not present

## 2019-01-27 DIAGNOSIS — C3492 Malignant neoplasm of unspecified part of left bronchus or lung: Secondary | ICD-10-CM

## 2019-01-27 DIAGNOSIS — Z902 Acquired absence of lung [part of]: Secondary | ICD-10-CM

## 2019-01-27 DIAGNOSIS — J9382 Other air leak: Secondary | ICD-10-CM

## 2019-01-27 DIAGNOSIS — IMO0002 Reserved for concepts with insufficient information to code with codable children: Secondary | ICD-10-CM

## 2019-01-27 NOTE — Progress Notes (Signed)
[address]       [place] [address]             [phone]      HPI:  Patient returns for routine postoperative follow-up having undergone VATS with wedge resection, completion lobectomy of RUL on 12/09/2018.  He underwent redo VATS on 12/21/2018 for persistent air leak.  He was found to have an apical bleb during that procedure which had ruptured.  He was discharged home on a Mini Express.  Since discharge he has been followed closely by Dr. Roxan Hockey.  His CXR have remained stable.  His most recent visit was yesterday during which it was felt we could not definitively determine if an air leak was present.  Therefore, his chest tube was clamped for 2 hours and CXR was obtained.  This film remained stable.  Therefore, he was sent home with his chest tube clamped.  He presents today for repeat CXR and follow up.  Patient states that he has done pretty well.   He denies any shortness of breath and worsening chest discomfort.   Current Outpatient Medications  Medication Sig Dispense Refill  . acetaminophen (TYLENOL) 500 MG tablet Take 1,000 mg by mouth every 6 (six) hours as needed (for arthritis pain.).     [name] albuterol (PROVENTIL) (2.5 MG/3ML) 0.083% nebulizer solution Take 2.5 mg by nebulization 4 (four) times daily.   5  . aspirin EC 81 MG tablet Take 81 mg by mouth daily.    [name] azithromycin (ZITHROMAX Z-PAK) 250 MG tablet Take 2 tablets (500 mg total) by mouth daily for 1 day, THEN 1 tablet (250 mg total) daily for 4 days. 6 each 0  . cloNIDine (CATAPRES) 0.1 MG tablet Take 1 tablet (0.1 mg total) by mouth daily. 30 tablet 3  . diltiazem (CARDIZEM SR) 60 MG 12 hr capsule Take 1 capsule (60 mg total) by mouth every 12 (twelve) hours. 60 capsule 3  . fexofenadine (ALLEGRA) 180 MG tablet Take 180 mg by mouth daily as needed for allergies or rhinitis.    [name] guaiFENesin (MUCINEX) 600 MG 12 hr tablet Take 1 tablet (600 mg total) by mouth 2 (two) times daily as needed. 60  tablet 3  . meclizine (ANTIVERT) 25 MG tablet Take 1 tablet (25 mg total) by mouth 3 (three) times daily as needed for dizziness. May cause drowsiness 30 tablet 0  . metoprolol succinate (TOPROL-XL) 50 MG 24 hr tablet Take 50 mg by mouth daily. Take with or immediately following a meal.    . oxyCODONE (OXY IR/ROXICODONE) 5 MG immediate release tablet Take 1-2 tablets (5-10 mg total) by mouth every 6 (six) hours as needed for severe pain. 30 tablet 0   No current facility-administered medications for this visit.     Physical Exam:  BP 100/64 (BP Location: Left Arm, Patient Position: Sitting, Cuff Size: Large)   Pulse 76   Temp 97.6 F (36.4 C) (Temporal)   Resp 18   Ht 5\' 10"  (1.778 m)   Wt 197 lb (89.4 kg)   SpO2 97% Comment: ON 3 L O2  BMI 28.27 kg/m   Gen; no apparent distress Heart: RRR Lungs; mildly diminished right base Incision: chest tube site is red and irritated, due to prolonged use of suture, surgical incisions are clean and dry  Diagnostic Tests:  CXR: reviewed, and shows stable appearance of apical space of right chest  A/P:  1. Patient has tolerated clamping of his  chest tube since yesterday afternoon.  CXR has remained stable.  There is no evidence of definitive air leak, but there continues to be some movement of air when he coughs.  We will remove his chest tube today.   Repeat CXR was obtained and remains stable in appearance of apical space   2. RTC in 1 week with repeat CXR with [facility].[name] Patient was instructions to contact our office or present to the ED should chest pain and shortness of breath develop.  He may also start showering tomorrow.   Ellwood Handler, PA-C [facility] [phone]

## 2019-01-27 NOTE — Patient Instructions (Signed)
May shower starting tomorrow.   Please contact our office or the ED should you develop sudden onset/worsening shortness of breath and/or chest pain.  [phone]

## 2019-01-28 DIAGNOSIS — Z9981 Dependence on supplemental oxygen: Secondary | ICD-10-CM | POA: Diagnosis not present

## 2019-01-28 DIAGNOSIS — J439 Emphysema, unspecified: Secondary | ICD-10-CM | POA: Diagnosis not present

## 2019-01-28 DIAGNOSIS — Z438 Encounter for attention to other artificial openings: Secondary | ICD-10-CM | POA: Diagnosis not present

## 2019-01-28 DIAGNOSIS — J9382 Other air leak: Secondary | ICD-10-CM | POA: Diagnosis not present

## 2019-01-29 ENCOUNTER — Other Ambulatory Visit: Payer: Self-pay

## 2019-02-01 ENCOUNTER — Ambulatory Visit: Payer: Self-pay | Admitting: Thoracic Surgery (Cardiothoracic Vascular Surgery)

## 2019-02-01 DIAGNOSIS — Z85118 Personal history of other malignant neoplasm of bronchus and lung: Secondary | ICD-10-CM | POA: Diagnosis not present

## 2019-02-01 DIAGNOSIS — Z9189 Other specified personal risk factors, not elsewhere classified: Secondary | ICD-10-CM | POA: Diagnosis not present

## 2019-02-01 DIAGNOSIS — J449 Chronic obstructive pulmonary disease, unspecified: Secondary | ICD-10-CM | POA: Diagnosis not present

## 2019-02-02 ENCOUNTER — Encounter: Payer: Self-pay | Admitting: Thoracic Surgery (Cardiothoracic Vascular Surgery)

## 2019-02-02 ENCOUNTER — Other Ambulatory Visit: Payer: Self-pay

## 2019-02-02 ENCOUNTER — Other Ambulatory Visit: Payer: Self-pay | Admitting: *Deleted

## 2019-02-02 ENCOUNTER — Ambulatory Visit (INDEPENDENT_AMBULATORY_CARE_PROVIDER_SITE_OTHER): Payer: Self-pay | Admitting: Thoracic Surgery (Cardiothoracic Vascular Surgery)

## 2019-02-02 ENCOUNTER — Ambulatory Visit
Admission: RE | Admit: 2019-02-02 | Discharge: 2019-02-02 | Disposition: A | Discharge status: Home / Self Care | Payer: Medicare Other | Source: Ambulatory Visit | Attending: Physician Assistant | Admitting: Physician Assistant

## 2019-02-02 VITALS — BP 100/60 | HR 70 | Temp 97.5°F | Resp 16 | Ht 70.0 in | Wt 197.0 lb

## 2019-02-02 DIAGNOSIS — Z902 Acquired absence of lung [part of]: Secondary | ICD-10-CM

## 2019-02-02 DIAGNOSIS — C3492 Malignant neoplasm of unspecified part of left bronchus or lung: Secondary | ICD-10-CM

## 2019-02-02 DIAGNOSIS — J9382 Other air leak: Secondary | ICD-10-CM

## 2019-02-02 DIAGNOSIS — J95812 Postprocedural air leak: Secondary | ICD-10-CM

## 2019-02-02 DIAGNOSIS — IMO0002 Reserved for concepts with insufficient information to code with codable children: Secondary | ICD-10-CM

## 2019-02-02 MED ORDER — DILTIAZEM HCL ER 60 MG PO CP12: 60.0000 mg | ORAL_CAPSULE | Freq: Two times a day (BID) | ORAL | 3 refills | Status: DC

## 2019-02-02 NOTE — Progress Notes (Signed)
[address]       [place] [address]             [phone]     HPI: Randall Sullivan returns for a scheduled follow-up visit  Randall Sullivan is a 68 year old gentleman with a history of a left upper lobectomy for a stage IA non-small cell carcinoma in 2017.  I did a right VATS for blebectomy and wedge resection of a right upper lobe nodule on 12/09/2018.  That nodule turned out to be a stage Ia non-small cell carcinoma.  He had a large air leak postoperatively and underwent IBV placement 12/11/2018.  His air leak improved transiently but then he developed a current large air leak.  I did a redo VATS and I resected a ruptured bleb in the superior segment of the lower lobe on 12/21/2018.  His air leak improved but was still present.  He went home with a chest tube in place.  I have been following him on a weekly basis since then.  He has an equivocal air leak.  I did a trial with clamping the tube for 24 hours, which he tolerated well.  His chest tube was removed on 01/27/2019.  He has not had any new respiratory issues since then.  He still using 2 L nasal cannula oxygen.  He does not feel short of breath.  He says his appetite is improved.  His pain is much better with the tube out.  Past Medical History:  Diagnosis Date  . A-fib ([place]) 2017   after last lung surgery patient had a history if Afib documented in hospital   . Arthritis    back & legs ( knees)  . Complication of anesthesia    agitated upon waking from anesth.   . Emphysema   . History of lung cancer   . Hypertension   . Tobacco abuse      Current Outpatient Medications  Medication Sig Dispense Refill  . acetaminophen (TYLENOL) 500 MG tablet Take 1,000 mg by mouth every 6 (six) hours as needed (for arthritis pain.).     [name] albuterol (PROVENTIL) (2.5 MG/3ML) 0.083% nebulizer solution Take 2.5 mg by nebulization 4 (four) times daily.   5  . aspirin EC 81 MG tablet Take 81 mg by mouth daily.    . cloNIDine  (CATAPRES) 0.1 MG tablet Take 1 tablet (0.1 mg total) by mouth daily. 30 tablet 3  . diltiazem (CARDIZEM SR) 60 MG 12 hr capsule Take 1 capsule (60 mg total) by mouth every 12 (twelve) hours. 60 capsule 3  . fexofenadine (ALLEGRA) 180 MG tablet Take 180 mg by mouth daily as needed for allergies or rhinitis.    [name] guaiFENesin (MUCINEX) 600 MG 12 hr tablet Take 1 tablet (600 mg total) by mouth 2 (two) times daily as needed. 60 tablet 3  . meclizine (ANTIVERT) 25 MG tablet Take 1 tablet (25 mg total) by mouth 3 (three) times daily as needed for dizziness. May cause drowsiness 30 tablet 0  . metoprolol succinate (TOPROL-XL) 50 MG 24 hr tablet Take 50 mg by mouth daily. Take with or immediately following a meal.    . oxyCODONE (OXY IR/ROXICODONE) 5 MG immediate release tablet Take 1-2 tablets (5-10 mg total) by mouth every 6 (six) hours as needed for severe pain. 30 tablet 0   No current facility-administered medications for this visit.     Physical Exam BP 100/60 (BP Location: Left Arm, Patient Position:  Sitting, Cuff Size: Large)   Pulse 70   Temp (!) 97.5 F (36.4 C) (Temporal)   Resp 16   Ht 5\' 10"  (1.778 m)   Wt 197 lb (89.4 kg)   SpO2 99% Comment: ON 2L O2  BMI 28.8 kg/m  68 year old man in no acute distress Alert and oriented x3 with no focal deficits Cardiac regular rate and rhythm Lungs faint wheezing Incisions well-healed  Diagnostic Tests: I reviewed his chest x-ray images.  This is essentially unchanged from his study from last week after chest tube removal.  Impression: Randall Sullivan is a 68 year old gentleman who had a right VATS for wedge resection of a 7 mm adenocarcinoma and blebectomy about 2 months ago.  Postoperative course was complicated by an air leak requiring IBV placement.  That improve matters transiently but his air leak enlarged and he ultimately went back for a redo VATS.  He was found to have a ruptured a bleb in the superior segment of the lower lobe.  That was  resected as well.  He continued to have an air leak postoperatively and went home with a chest tube in place.  That was removed last week.  His chest x-ray today is stable and his pulmonary status is improving.  He did have IBV's placed about 7 weeks ago.  We need to do a bronchoscopy to remove the IBV next week.  I discussed the indications, risks, benefits, and alternatives with him.  He understands the risk include those associated with general anesthesia.  He understands the risk include, but not limited to death, MI, blood clot, recurrent pneumothorax, as well as the possibility of other unforeseeable complications.  He accepts the risks and agrees to proceed.  Plan:  Bronchoscopy for IBV removal Wednesday, 02/10/2019 Has appointments for CT on 02/15/2019 and follow-up with Dr. Julien Nordmann on 02/17/2019  [name], MD [facility] [phone]

## 2019-02-02 NOTE — H&P (View-Only) (Signed)
[address]       [place] [address]             [phone]     HPI: Randall Sullivan returns for a scheduled follow-up visit  Randall Sullivan is a 68 year old gentleman with a history of a left upper lobectomy for a stage IA non-small cell carcinoma in 2017.  I did a right VATS for blebectomy and wedge resection of a right upper lobe nodule on 12/09/2018.  That nodule turned out to be a stage Ia non-small cell carcinoma.  He had a large air leak postoperatively and underwent IBV placement 12/11/2018.  His air leak improved transiently but then he developed a current large air leak.  I did a redo VATS and I resected a ruptured bleb in the superior segment of the lower lobe on 12/21/2018.  His air leak improved but was still present.  He went home with a chest tube in place.  I have been following him on a weekly basis since then.  He has an equivocal air leak.  I did a trial with clamping the tube for 24 hours, which he tolerated well.  His chest tube was removed on 01/27/2019.  He has not had any new respiratory issues since then.  He still using 2 L nasal cannula oxygen.  He does not feel short of breath.  He says his appetite is improved.  His pain is much better with the tube out.  Past Medical History:  Diagnosis Date  . A-fib ([place]) 2017   after last lung surgery patient had a history if Afib documented in hospital   . Arthritis    back & legs ( knees)  . Complication of anesthesia    agitated upon waking from anesth.   . Emphysema   . History of lung cancer   . Hypertension   . Tobacco abuse      Current Outpatient Medications  Medication Sig Dispense Refill  . acetaminophen (TYLENOL) 500 MG tablet Take 1,000 mg by mouth every 6 (six) hours as needed (for arthritis pain.).     [name] albuterol (PROVENTIL) (2.5 MG/3ML) 0.083% nebulizer solution Take 2.5 mg by nebulization 4 (four) times daily.   5  . aspirin EC 81 MG tablet Take 81 mg by mouth daily.    . cloNIDine  (CATAPRES) 0.1 MG tablet Take 1 tablet (0.1 mg total) by mouth daily. 30 tablet 3  . diltiazem (CARDIZEM SR) 60 MG 12 hr capsule Take 1 capsule (60 mg total) by mouth every 12 (twelve) hours. 60 capsule 3  . fexofenadine (ALLEGRA) 180 MG tablet Take 180 mg by mouth daily as needed for allergies or rhinitis.    [name] guaiFENesin (MUCINEX) 600 MG 12 hr tablet Take 1 tablet (600 mg total) by mouth 2 (two) times daily as needed. 60 tablet 3  . meclizine (ANTIVERT) 25 MG tablet Take 1 tablet (25 mg total) by mouth 3 (three) times daily as needed for dizziness. May cause drowsiness 30 tablet 0  . metoprolol succinate (TOPROL-XL) 50 MG 24 hr tablet Take 50 mg by mouth daily. Take with or immediately following a meal.    . oxyCODONE (OXY IR/ROXICODONE) 5 MG immediate release tablet Take 1-2 tablets (5-10 mg total) by mouth every 6 (six) hours as needed for severe pain. 30 tablet 0   No current facility-administered medications for this visit.     Physical Exam BP 100/60 (BP Location: Left Arm, Patient Position:  Sitting, Cuff Size: Large)   Pulse 70   Temp (!) 97.5 F (36.4 C) (Temporal)   Resp 16   Ht 5\' 10"  (1.778 m)   Wt 197 lb (89.4 kg)   SpO2 99% Comment: ON 2L O2  BMI 28.70 kg/m  68 year old man in no acute distress Alert and oriented x3 with no focal deficits Cardiac regular rate and rhythm Lungs faint wheezing Incisions well-healed  Diagnostic Tests: I reviewed his chest x-ray images.  This is essentially unchanged from his study from last week after chest tube removal.  Impression: Randall Sullivan is a 68 year old gentleman who had a right VATS for wedge resection of a 7 mm adenocarcinoma and blebectomy about 2 months ago.  Postoperative course was complicated by an air leak requiring IBV placement.  That improve matters transiently but his air leak enlarged and he ultimately went back for a redo VATS.  He was found to have a ruptured a bleb in the superior segment of the lower lobe.  That was  resected as well.  He continued to have an air leak postoperatively and went home with a chest tube in place.  That was removed last week.  His chest x-ray today is stable and his pulmonary status is improving.  He did have IBV's placed about 7 weeks ago.  We need to do a bronchoscopy to remove the IBV next week.  I discussed the indications, risks, benefits, and alternatives with him.  He understands the risk include those associated with general anesthesia.  He understands the risk include, but not limited to death, MI, blood clot, recurrent pneumothorax, as well as the possibility of other unforeseeable complications.  He accepts the risks and agrees to proceed.  Plan:  Bronchoscopy for IBV removal Wednesday, 02/10/2019 Has appointments for CT on 02/15/2019 and follow-up with Dr. Julien Nordmann on 02/17/2019  [name], MD [facility] [phone]

## 2019-02-03 DIAGNOSIS — J9382 Other air leak: Secondary | ICD-10-CM | POA: Diagnosis not present

## 2019-02-03 DIAGNOSIS — Z9981 Dependence on supplemental oxygen: Secondary | ICD-10-CM | POA: Diagnosis not present

## 2019-02-03 DIAGNOSIS — Z438 Encounter for attention to other artificial openings: Secondary | ICD-10-CM | POA: Diagnosis not present

## 2019-02-03 DIAGNOSIS — J439 Emphysema, unspecified: Secondary | ICD-10-CM | POA: Diagnosis not present

## 2019-02-04 NOTE — Pre-Procedure Instructions (Signed)
Cristiano Capri  02/04/2019      [facility] [address], [address] [place] [address] Phone: [phone] Fax: [phone]    Your procedure is scheduled on  02-10-19 Wednesday from [id number]  Report to [facility] Admitting , Entrance A at 1045am .  Call this number if you have problems the morning of surgery:  [phone]   Remember:  Do not eat or drink after midnight.     Take these medicines the morning of surgery with A SIP OF WATER :             CloNIDine (CATAPRES)             Diltiazem (CARDIZEM SR)             Metoprolol succinate (TOPROL-XL)              As needed:             Acetaminophen (TYLENOL)             Albuterol (PROVENTIL). Bring your inhaler with you.             Fexofenadine (ALLEGRA)              GuaiFENesin (MUCINEX)             Meclizine (ANTIVERT)             OxyCODONE (OXY IR/ROXICODONE)  Follow your surgeon's instructions on when to stop Aspirin.  If no instructions were given by your surgeon then you will need to call the office to get those instructions.    As of today stop taking any Aleve, Naproxen, Ibuprofen, Motrin, Advil, Goody's, BC's, all herbal medications, fish oil, and all vitamins.   Do not wear jewelry, make-up or nail polish.  Do not wear lotions, powders, or perfumes, or deodorant.  Do not shave 48 hours prior to surgery.  Men may shave face and neck.  Do not bring valuables to the hospital.  [facility] is not responsible for any belongings or valuables.  Contacts, dentures or bridgework may not be worn into surgery.  Leave your suitcase in the car.  After surgery it may be brought to your room.  For patients admitted to the hospital, discharge time will be determined by your treatment team.  Patients discharged the day of surgery will not be allowed to drive home.   Special instructions:   [facility]- Preparing For Surgery  Before surgery, you can play an important  role. Because skin is not sterile, your skin needs to be as free of germs as possible. You can reduce the number of germs on your skin by washing with CHG (chlorahexidine gluconate) Soap before surgery.  CHG is an antiseptic cleaner which kills germs and bonds with the skin to continue killing germs even after washing.    Oral Hygiene is also important to reduce your risk of infection.  Remember - BRUSH YOUR TEETH THE MORNING OF SURGERY WITH YOUR REGULAR TOOTHPASTE  Please do not use if you have an allergy to CHG or antibacterial soaps. If your skin becomes reddened/irritated stop using the CHG.  Do not shave (including legs and underarms) for at least 48 hours prior to first CHG shower. It is OK to shave your face.  Please follow these instructions carefully.   1. Shower the NIGHT BEFORE SURGERY and the MORNING OF SURGERY with CHG.   2. If you  chose to wash your hair, wash your hair first as usual with your normal shampoo.  3. After you shampoo, rinse your hair and body thoroughly to remove the shampoo.  4. Use CHG as you would any other liquid soap. You can apply CHG directly to the skin and wash gently with a scrungie or a clean washcloth.   5. Apply the CHG Soap to your body ONLY FROM THE NECK DOWN.  Do not use on open wounds or open sores. Avoid contact with your eyes, ears, mouth and genitals (private parts). Wash Face and genitals (private parts)  with your normal soap.  6. Wash thoroughly, paying special attention to the area where your surgery will be performed.  7. Thoroughly rinse your body with warm water from the neck down.  8. DO NOT shower/wash with your normal soap after using and rinsing off the CHG Soap.  9. Pat yourself dry with a CLEAN TOWEL.  10. Wear CLEAN PAJAMAS to bed the night before surgery, wear comfortable clothes the morning of surgery  11. Place CLEAN SHEETS on your bed the night of your first shower and DO NOT SLEEP WITH PETS.  Day of Surgery:  Do not  apply any deodorants/lotions.  Please wear clean clothes to the hospital/surgery center.   Remember to brush your teeth WITH YOUR REGULAR TOOTHPASTE.  Please read over the following fact sheets that you were given. Pain Booklet, Coughing and Deep Breathing, MRSA Information and Surgical Site Infection Prevention

## 2019-02-05 ENCOUNTER — Encounter (HOSPITAL_COMMUNITY): Payer: Self-pay

## 2019-02-05 ENCOUNTER — Encounter (HOSPITAL_COMMUNITY)
Admission: RE | Admit: 2019-02-05 | Discharge: 2019-02-05 | Disposition: A | Discharge status: Home / Self Care | Payer: Medicare Other | Source: Ambulatory Visit | Attending: Thoracic Surgery (Cardiothoracic Vascular Surgery) | Admitting: Thoracic Surgery (Cardiothoracic Vascular Surgery)

## 2019-02-05 ENCOUNTER — Other Ambulatory Visit: Payer: Self-pay

## 2019-02-05 DIAGNOSIS — Z01818 Encounter for other preprocedural examination: Secondary | ICD-10-CM | POA: Insufficient documentation

## 2019-02-05 DIAGNOSIS — Z1159 Encounter for screening for other viral diseases: Secondary | ICD-10-CM | POA: Diagnosis not present

## 2019-02-05 DIAGNOSIS — J95812 Postprocedural air leak: Secondary | ICD-10-CM | POA: Diagnosis not present

## 2019-02-05 HISTORY — DX: Malignant (primary) neoplasm, unspecified: C80.1

## 2019-02-05 HISTORY — DX: Emphysema, unspecified: J43.9

## 2019-02-05 HISTORY — DX: Dependence on supplemental oxygen: Z99.81

## 2019-02-05 LAB — APTT: aPTT: 35 seconds (ref 24–36)

## 2019-02-05 LAB — CBC
HCT: 34.5 % — ABNORMAL LOW (ref 39.0–52.0)
Hemoglobin: 10.2 g/dL — ABNORMAL LOW (ref 13.0–17.0)
MCH: 24.6 pg — ABNORMAL LOW (ref 26.0–34.0)
MCHC: 29.6 g/dL — ABNORMAL LOW (ref 30.0–36.0)
MCV: 83.3 fL (ref 80.0–100.0)
Platelets: 488 10*3/uL — ABNORMAL HIGH (ref 150–400)
RBC: 4.14 MIL/uL — ABNORMAL LOW (ref 4.22–5.81)
RDW: 15.5 % (ref 11.5–15.5)
WBC: 11 10*3/uL — ABNORMAL HIGH (ref 4.0–10.5)
nRBC: 0 % (ref 0.0–0.2)

## 2019-02-05 LAB — COMPREHENSIVE METABOLIC PANEL
ALT: 12 U/L (ref 0–44)
AST: 19 U/L (ref 15–41)
Albumin: 2.7 g/dL — ABNORMAL LOW (ref 3.5–5.0)
Alkaline Phosphatase: 62 U/L (ref 38–126)
Anion gap: 11 (ref 5–15)
BUN: 13 mg/dL (ref 8–23)
CO2: 31 mmol/L (ref 22–32)
Calcium: 9 mg/dL (ref 8.9–10.3)
Chloride: 96 mmol/L — ABNORMAL LOW (ref 98–111)
Creatinine, Ser: 0.75 mg/dL (ref 0.61–1.24)
GFR calc Af Amer: >60 mL/min (ref >60)
GFR calc non Af Amer: >60 mL/min (ref >60)
Glucose, Bld: 82 mg/dL (ref 70–99)
Potassium: 4.6 mmol/L (ref 3.5–5.1)
Sodium: 138 mmol/L (ref 135–145)
Total Bilirubin: 0.3 mg/dL (ref 0.3–1.2)
Total Protein: 7.6 g/dL (ref 6.5–8.1)

## 2019-02-05 LAB — SURGICAL PCR SCREEN
MRSA, PCR: NEGATIVE
Staphylococcus aureus: NEGATIVE

## 2019-02-05 LAB — PROTIME-INR
INR: 1.1 (ref 0.8–1.2)
Prothrombin Time: 14.2 seconds (ref 11.4–15.2)

## 2019-02-05 NOTE — Progress Notes (Signed)
PCP - Denies  Cardiologist - Denies  Chest x-ray - 02/10/2019-DOS  EKG - 02/05/2019  Stress Test - Denies  ECHO - Denies  Cardiac Cath - Denies  AICD-na PM-na LOOP-na  Sleep Study - Denies CPAP - None Pt on continuous O2 at 2L  LABS- 02/05/2019: CBC, CMP, PT, PTT, PCR [place]- 5/11  ASA- LD-5/12  ERAS-No  Anesthesia-No  Pt denies having chest pain, sob, or fever at this time. All instructions explained to the pt, with a verbal understanding of the material. Pt agrees to go over the instructions while at home for a better understanding. The opportunity to ask questions was provided.   Coronavirus Screening  Have you experienced the following symptoms:  Cough yes/no: No Fever (>100.75F)  yes/no: No Runny nose yes/no: No Sore throat yes/no: No Difficulty breathing/shortness of breath  yes/no: No  Have you or a family member traveled in the last 14 days and where? yes/no: No   If the patient indicates "YES" to the above questions, their PAT will be rescheduled to limit the exposure to others and, the surgeon will be notified. THE PATIENT WILL NEED TO BE ASYMPTOMATIC FOR 14 DAYS.   If the patient is not experiencing any of these symptoms, the PAT nurse will instruct them to NOT bring anyone with them to their appointment since they may have these symptoms or traveled as well.   Please remind your patients and families that hospital visitation restrictions are in effect and the importance of the restrictions.

## 2019-02-06 DIAGNOSIS — Z9981 Dependence on supplemental oxygen: Secondary | ICD-10-CM | POA: Diagnosis not present

## 2019-02-06 DIAGNOSIS — Z438 Encounter for attention to other artificial openings: Secondary | ICD-10-CM | POA: Diagnosis not present

## 2019-02-06 DIAGNOSIS — J9382 Other air leak: Secondary | ICD-10-CM | POA: Diagnosis not present

## 2019-02-06 DIAGNOSIS — J439 Emphysema, unspecified: Secondary | ICD-10-CM | POA: Diagnosis not present

## 2019-02-08 ENCOUNTER — Other Ambulatory Visit (HOSPITAL_COMMUNITY)
Admission: RE | Admit: 2019-02-08 | Discharge: 2019-02-08 | Disposition: A | Discharge status: Home / Self Care | Payer: Medicare Other | Source: Ambulatory Visit | Attending: Thoracic Surgery (Cardiothoracic Vascular Surgery) | Admitting: Thoracic Surgery (Cardiothoracic Vascular Surgery)

## 2019-02-08 ENCOUNTER — Other Ambulatory Visit: Payer: Self-pay

## 2019-02-08 DIAGNOSIS — Z1159 Encounter for screening for other viral diseases: Secondary | ICD-10-CM | POA: Diagnosis not present

## 2019-02-08 DIAGNOSIS — Z01818 Encounter for other preprocedural examination: Secondary | ICD-10-CM | POA: Diagnosis not present

## 2019-02-08 DIAGNOSIS — J95812 Postprocedural air leak: Secondary | ICD-10-CM | POA: Diagnosis not present

## 2019-02-08 LAB — BLOOD GAS, ARTERIAL
Acid-Base Excess: 3 mmol/L — ABNORMAL HIGH (ref 0.0–2.0)
BICARBONATE: 26 mmol/L (ref 20.0–28.0)
Drawn by: 24487
FIO2: 100
MECHVT: 490 mL
O2 Saturation: 97.9 %
PEEP: 5 cmH2O
RATE: 20 resp/min
pCO2 arterial: 32.6 mmHg (ref 32.0–48.0)
pH, Arterial: 7.513 — ABNORMAL HIGH (ref 7.350–7.450)
pO2, Arterial: 343 mmHg — ABNORMAL HIGH (ref 83.0–108.0)

## 2019-02-09 DIAGNOSIS — Z438 Encounter for attention to other artificial openings: Secondary | ICD-10-CM | POA: Diagnosis not present

## 2019-02-09 DIAGNOSIS — Z9981 Dependence on supplemental oxygen: Secondary | ICD-10-CM | POA: Diagnosis not present

## 2019-02-09 DIAGNOSIS — J9382 Other air leak: Secondary | ICD-10-CM | POA: Diagnosis not present

## 2019-02-09 DIAGNOSIS — J439 Emphysema, unspecified: Secondary | ICD-10-CM | POA: Diagnosis not present

## 2019-02-09 LAB — NOVEL CORONAVIRUS, NAA (HOSP ORDER, SEND-OUT TO REF LAB; TAT 18-24 HRS): SARS-CoV-2, NAA: NOT DETECTED

## 2019-02-09 NOTE — Anesthesia Preprocedure Evaluation (Addendum)
Anesthesia Evaluation  Patient identified by MRN, date of birth, ID band Patient awake    Reviewed: Allergy & Precautions, NPO status , Patient's Chart, lab work & pertinent test results, reviewed documented beta blocker date and time   History of Anesthesia Complications (+) Emergence Delirium  Airway Mallampati: II  TM Distance: >3 FB Neck ROM: Full    Dental  (+) Dental Advisory Given   Pulmonary COPD,  oxygen dependent, former smoker (quit 2017),  Lung cancer:  Adenocarcinoma LUL New RUL lesion   breath sounds clear to auscultation       Cardiovascular hypertension, Pt. on medications and Pt. on home beta blockers + dysrhythmias  Rhythm:Irregular Rate:Normal     Neuro/Psych negative neurological ROS     GI/Hepatic Neg liver ROS, GERD  Controlled,  Endo/Other    Renal/GU negative Renal ROS     Musculoskeletal  (+) Arthritis ,   Abdominal   Peds  Hematology  (+) Blood dyscrasia (Hb 10.2), anemia ,   Anesthesia Other Findings   Reproductive/Obstetrics                          Anesthesia Physical Anesthesia Plan  ASA: III  Anesthesia Plan: General   Post-op Pain Management:    Induction: Intravenous  PONV Risk Score and Plan: 2 and Ondansetron and Dexamethasone  Airway Management Planned: Oral ETT  Additional Equipment:   Intra-op Plan:   Post-operative Plan: Extubation in OR  Informed Consent: I have reviewed the patients History and Physical, chart, labs and discussed the procedure including the risks, benefits and alternatives for the proposed anesthesia with the patient or authorized representative who has indicated his/her understanding and acceptance.     Dental advisory given  Plan Discussed with: CRNA and Surgeon  Anesthesia Plan Comments: (PAT note written 02/09/2019 by Myra Gianotti, PA-C. )      Anesthesia Quick Evaluation

## 2019-02-09 NOTE — Progress Notes (Signed)
Anesthesia Chart Review:  Case:  [id number] Date/Time:  02/10/19 1238   Procedure:  VIDEO BRONCHOSCOPY WITH REMOVAL OF INTERBRONCHIAL VALVE (IBV) (N/A )   Anesthesia type:  General   Pre-op diagnosis:  INTERBRONCHIAL VALVES   Location:  [facility] OR ROOM 66 / [place] OR   Surgeon:  [name], MD      DISCUSSION: Patient is a 68 year old male scheduled for the above procedure.  History includes former smoker (quit 2017), lung cancer (s/p left VATS, LU lobectomy for stage IA adenocarcinoma 08/01/16; s/p right VATS, wedge resection RUL and bleb resection 12/09/18 for stage IA adenocarcioma; s/p endobronchial valve RUL 12/11/18; s/p redo right VATS, stapling of RLL bleb 12/21/18), emphysema, home oxygen (2L/[place]), HTN, post-operative afib 07/2016.  He has tolerated multiple thoracic surgeries within the past few months. If no acute changes then I anticipate that he can proceed as planned.   VS: BP 126/66   Pulse 63   Temp 36.8 C   Resp 18   Ht 5\' 10"  (1.778 m)   Wt 75.2 kg   SpO2 100%   BMI 23.79 kg/m    PROVIDERS: Brigitte Pulse, DO  Curt Bears, MD is Otho Perl, MD is cardiologist. Seen for post-op afib in 2017 which resolved. Last visit 05/23/17, warfarin discontinued with PRN cardiology follow-up recommended.    LABS: Labs reviewed: Acceptable for surgery. (all labs ordered are listed, but only abnormal results are displayed)  Labs Reviewed  CBC - Abnormal; Notable for the following components:      Result Value   WBC 11.0 (*)    RBC 4.14 (*)    Hemoglobin 10.2 (*)    HCT 34.5 (*)    MCH 24.6 (*)    MCHC 29.6 (*)    Platelets 488 (*)    All other components within normal limits  COMPREHENSIVE METABOLIC PANEL - Abnormal; Notable for the following components:   Chloride 96 (*)    Albumin 2.7 (*)    All other components within normal limits  SURGICAL PCR SCREEN  APTT  PROTIME-INR    PFTs 12/07/18 (pre RUL wedge resection):  PRE:   FVC 3.49 (77%),  FEV1 1.01 (30%) POST: FVC 3.85 (85%), FEV1 1.14 (34%) DLCO unc 16.39 (62%)   IMAGES: CXR 02/02/19: IMPRESSION: 1. No pneumothorax. 2. Stable small right pleural effusion. 3. Patchy right lower lobe lung consolidation is slightly decreased, potentially atelectasis/pneumonia, continued chest radiograph follow-up recommended to document complete resolution of this finding. 4. Emphysema and lung hyperinflation, suggesting COPD.  PET scan 10/15/18: IMPRESSION: 1. The 8 by 5 mm right upper lobe pulmonary nodule is focally hypermetabolic. Although the SUV is only 1.9, this nodule is at the lower size limits for sensitive assessment by PET-CT, and accordingly even at this lower SUV concern is raised for the possibility of malignancy. The lesion has also increased in size compared to 04/09/2017, suspicious for low-grade malignancy. I am skeptical that percutaneous biopsy would be feasible, and wedge resection likewise is potentially on feasible given the obviously low respiratory reserve based on the degree of emphysema and prior left upper lobectomy. Possibilities might include further surveillance for presumptive treatment with radiation therapy. This case might benefit from discussion at the multidisciplinary thoracic oncology conference. 2. Infrarenal abdominal aortic aneurysm, 3.6 cm in diameter. Recommend followup by [place] in 2 years. This recommendation follows ACR consensus guidelines: White Paper of the ACR Incidental Findings Committee II on Vascular Findings. J Am Coll Radiol 2013; 10:789-794. 3.  Other imaging findings of potential clinical significance: Aortic Atherosclerosis (ICD10-I70.0) and Emphysema (ICD10-J43.9). Coronary atherosclerosis. Left upper lobectomy.   EKG: 02/05/19: Sinus rhythm with short PR Otherwise normal ECG Compared to previous tracing sinus rhythm is now present Confirmed by Lyman Bishop [id number]) on 02/05/2019 4:45:18 PM   CV: N/A  Past Medical  History:  Diagnosis Date  . A-fib ([place]) 2017   after last lung surgery patient had a history if Afib documented in hospital   . Arthritis    back & legs ( knees)  . Cancer ([place])    lung  . Complication of anesthesia    agitated upon waking from anesth.   . Emphysema   . Emphysema of lung ([place])   . History of lung cancer   . Hypertension   . Oxygen dependent    2L continuous  . Tobacco abuse     Past Surgical History:  Procedure Laterality Date  . HERNIA REPAIR Bilateral 2025   umbilical  . VIDEO ASSISTED THORACOSCOPY Right 12/21/2018   Procedure: VIDEO ASSISTED THORACOSCOPY AND RESECTION OF BLEBS RIGHT LOWER LOBE;  Surgeon: [name], MD;  Location: [place];  Service: Thoracic;  Laterality: Right;  [name] VIDEO ASSISTED THORACOSCOPY (VATS)/ LOBECTOMY Left 08/01/2016   Procedure: VIDEO ASSISTED THORACOSCOPY (VATS)/ LEFT UPPER LOBECTOMY with lymph node sampling and onQ placement;  Surgeon: [name], MD;  Location: [place];  Service: Thoracic;  Laterality: Left;  [name] VIDEO ASSISTED THORACOSCOPY (VATS)/WEDGE RESECTION Right 12/09/2018   Procedure: VIDEO ASSISTED THORACOSCOPY (VATS)/WEDGE RESECTION;  Surgeon: [name], MD;  Location: [place];  Service: Thoracic;  Laterality: Right;  [name] VIDEO BRONCHOSCOPY Bilateral 12/21/2018   Procedure: VIDEO BRONCHOSCOPY;  Surgeon: [name], MD;  Location: [place];  Service: Thoracic;  Laterality: Bilateral;  . VIDEO BRONCHOSCOPY WITH ENDOBRONCHIAL NAVIGATION N/A 07/03/2016   Procedure: VIDEO BRONCHOSCOPY WITH ENDOBRONCHIAL NAVIGATION;  Surgeon: [name], MD;  Location: [place];  Service: Thoracic;  Laterality: N/A;  . VIDEO BRONCHOSCOPY WITH INSERTION OF INTERBRONCHIAL VALVE (IBV) N/A 12/11/2018   Procedure: VIDEO BRONCHOSCOPY WITH INSERTION OF INTERBRONCHIAL VALVE (IBV);  Surgeon: [name], MD;  Location: [facility] OR;  Service: Thoracic;  Laterality: N/A;    MEDICATIONS: . OXYGEN  . acetaminophen  (TYLENOL) 500 MG tablet  . albuterol (PROVENTIL) (2.5 MG/3ML) 0.083% nebulizer solution  . aspirin EC 81 MG tablet  . cloNIDine (CATAPRES) 0.1 MG tablet  . diltiazem (CARDIZEM SR) 60 MG 12 hr capsule  . fexofenadine (ALLEGRA) 180 MG tablet  . guaiFENesin (MUCINEX) 600 MG 12 hr tablet  . meclizine (ANTIVERT) 25 MG tablet  . metoprolol succinate (TOPROL-XL) 50 MG 24 hr tablet  . oxyCODONE (OXY IR/ROXICODONE) 5 MG immediate release tablet   No current facility-administered medications for this encounter.     Myra Gianotti, PA-C Surgical Short Stay/Anesthesiology [facility] Phone [phone] [facility] Phone [phone] 02/09/2019 3:37 PM

## 2019-02-10 ENCOUNTER — Ambulatory Visit (HOSPITAL_COMMUNITY): Payer: Medicare Other | Admitting: Vascular Surgery

## 2019-02-10 ENCOUNTER — Encounter (HOSPITAL_COMMUNITY)
Admission: RE | Disposition: A | Discharge status: Home / Self Care | Payer: Self-pay | Source: Home / Self Care | Attending: Thoracic Surgery (Cardiothoracic Vascular Surgery)

## 2019-02-10 ENCOUNTER — Ambulatory Visit (HOSPITAL_COMMUNITY): Payer: Medicare Other

## 2019-02-10 ENCOUNTER — Encounter (HOSPITAL_COMMUNITY): Payer: Self-pay | Admitting: Anesthesiology

## 2019-02-10 ENCOUNTER — Ambulatory Visit (HOSPITAL_COMMUNITY)
Admission: RE | Admit: 2019-02-10 | Discharge: 2019-02-10 | Disposition: A | Discharge status: Home / Self Care | Payer: Medicare Other | Attending: Thoracic Surgery (Cardiothoracic Vascular Surgery) | Admitting: Thoracic Surgery (Cardiothoracic Vascular Surgery)

## 2019-02-10 ENCOUNTER — Ambulatory Visit (HOSPITAL_COMMUNITY): Payer: Medicare Other | Admitting: Anesthesiology

## 2019-02-10 ENCOUNTER — Other Ambulatory Visit: Payer: Self-pay

## 2019-02-10 DIAGNOSIS — Z01818 Encounter for other preprocedural examination: Secondary | ICD-10-CM | POA: Diagnosis not present

## 2019-02-10 DIAGNOSIS — D649 Anemia, unspecified: Secondary | ICD-10-CM | POA: Diagnosis not present

## 2019-02-10 DIAGNOSIS — Z85118 Personal history of other malignant neoplasm of bronchus and lung: Secondary | ICD-10-CM | POA: Diagnosis not present

## 2019-02-10 DIAGNOSIS — I4891 Unspecified atrial fibrillation: Secondary | ICD-10-CM | POA: Insufficient documentation

## 2019-02-10 DIAGNOSIS — J95811 Postprocedural pneumothorax: Secondary | ICD-10-CM | POA: Diagnosis not present

## 2019-02-10 DIAGNOSIS — Z9981 Dependence on supplemental oxygen: Secondary | ICD-10-CM | POA: Diagnosis not present

## 2019-02-10 DIAGNOSIS — Z9889 Other specified postprocedural states: Secondary | ICD-10-CM

## 2019-02-10 DIAGNOSIS — J9589 Other postprocedural complications and disorders of respiratory system, not elsewhere classified: Secondary | ICD-10-CM | POA: Diagnosis not present

## 2019-02-10 DIAGNOSIS — D759 Disease of blood and blood-forming organs, unspecified: Secondary | ICD-10-CM | POA: Insufficient documentation

## 2019-02-10 DIAGNOSIS — Z7982 Long term (current) use of aspirin: Secondary | ICD-10-CM | POA: Insufficient documentation

## 2019-02-10 DIAGNOSIS — Z87891 Personal history of nicotine dependence: Secondary | ICD-10-CM | POA: Diagnosis not present

## 2019-02-10 DIAGNOSIS — Z9689 Presence of other specified functional implants: Secondary | ICD-10-CM | POA: Diagnosis not present

## 2019-02-10 DIAGNOSIS — J449 Chronic obstructive pulmonary disease, unspecified: Secondary | ICD-10-CM | POA: Diagnosis not present

## 2019-02-10 DIAGNOSIS — M47819 Spondylosis without myelopathy or radiculopathy, site unspecified: Secondary | ICD-10-CM | POA: Insufficient documentation

## 2019-02-10 DIAGNOSIS — I10 Essential (primary) hypertension: Secondary | ICD-10-CM | POA: Diagnosis not present

## 2019-02-10 DIAGNOSIS — M170 Bilateral primary osteoarthritis of knee: Secondary | ICD-10-CM | POA: Insufficient documentation

## 2019-02-10 DIAGNOSIS — J95812 Postprocedural air leak: Secondary | ICD-10-CM | POA: Insufficient documentation

## 2019-02-10 DIAGNOSIS — Z79899 Other long term (current) drug therapy: Secondary | ICD-10-CM | POA: Insufficient documentation

## 2019-02-10 DIAGNOSIS — J439 Emphysema, unspecified: Secondary | ICD-10-CM | POA: Insufficient documentation

## 2019-02-10 DIAGNOSIS — M1990 Unspecified osteoarthritis, unspecified site: Secondary | ICD-10-CM | POA: Diagnosis not present

## 2019-02-10 HISTORY — PX: VIDEO BRONCHOSCOPY WITH INSERTION OF INTERBRONCHIAL VALVE (IBV): SHX6178

## 2019-02-10 SURGERY — BRONCHOSCOPY, FLEXIBLE, WITH INTRABRONCHIAL VALVE INSERTION
Anesthesia: General | Site: Chest

## 2019-02-10 MED ORDER — PROPOFOL 10 MG/ML IV BOLUS
INTRAVENOUS | Status: DC | PRN
  Administered 2019-02-10: 10 mg via INTRAVENOUS
  Administered 2019-02-10: 100 mg via INTRAVENOUS
  Administered 2019-02-10: 10 mg via INTRAVENOUS

## 2019-02-10 MED ORDER — EPHEDRINE SULFATE 50 MG/ML IJ SOLN
INTRAMUSCULAR | Status: DC | PRN
  Administered 2019-02-10: 5 mg via INTRAVENOUS
  Administered 2019-02-10: 10 mg via INTRAVENOUS

## 2019-02-10 MED ORDER — ROCURONIUM BROMIDE 10 MG/ML (PF) SYRINGE
PREFILLED_SYRINGE | INTRAVENOUS | Status: AC
  Filled 2019-02-10: qty 10

## 2019-02-10 MED ORDER — FENTANYL CITRATE (PF) 250 MCG/5ML IJ SOLN
INTRAMUSCULAR | Status: AC
  Filled 2019-02-10: qty 5

## 2019-02-10 MED ORDER — LIDOCAINE 2% (20 MG/ML) 5 ML SYRINGE
INTRAMUSCULAR | Status: AC
  Filled 2019-02-10: qty 5

## 2019-02-10 MED ORDER — PROPOFOL 10 MG/ML IV BOLUS
INTRAVENOUS | Status: AC
  Filled 2019-02-10: qty 20

## 2019-02-10 MED ORDER — DEXMEDETOMIDINE HCL 200 MCG/2ML IV SOLN
INTRAVENOUS | Status: DC | PRN
  Administered 2019-02-10 (×4): 8 ug via INTRAVENOUS

## 2019-02-10 MED ORDER — SUCCINYLCHOLINE CHLORIDE 200 MG/10ML IV SOSY
PREFILLED_SYRINGE | INTRAVENOUS | Status: AC
  Filled 2019-02-10: qty 10

## 2019-02-10 MED ORDER — EPHEDRINE 5 MG/ML INJ
INTRAVENOUS | Status: AC
  Filled 2019-02-10: qty 10

## 2019-02-10 MED ORDER — ONDANSETRON HCL 4 MG/2ML IJ SOLN
INTRAMUSCULAR | Status: DC | PRN
  Administered 2019-02-10: 4 mg via INTRAVENOUS

## 2019-02-10 MED ORDER — DEXAMETHASONE SODIUM PHOSPHATE 10 MG/ML IJ SOLN
INTRAMUSCULAR | Status: AC
  Filled 2019-02-10: qty 2

## 2019-02-10 MED ORDER — ONDANSETRON HCL 4 MG/2ML IJ SOLN
INTRAMUSCULAR | Status: AC
  Filled 2019-02-10: qty 4

## 2019-02-10 MED ORDER — EPINEPHRINE PF 1 MG/ML IJ SOLN
INTRAMUSCULAR | Status: AC
  Filled 2019-02-10: qty 1

## 2019-02-10 MED ORDER — CEFAZOLIN SODIUM 1 G IJ SOLR
INTRAMUSCULAR | Status: AC
  Filled 2019-02-10: qty 20

## 2019-02-10 MED ORDER — LIDOCAINE 2% (20 MG/ML) 5 ML SYRINGE
INTRAMUSCULAR | Status: DC | PRN
  Administered 2019-02-10: 20 mg via INTRAVENOUS

## 2019-02-10 MED ORDER — ROCURONIUM BROMIDE 10 MG/ML (PF) SYRINGE
PREFILLED_SYRINGE | INTRAVENOUS | Status: AC
  Filled 2019-02-10: qty 20

## 2019-02-10 MED ORDER — MIDAZOLAM HCL 2 MG/2ML IJ SOLN
INTRAMUSCULAR | Status: AC
  Filled 2019-02-10: qty 2

## 2019-02-10 MED ORDER — ROCURONIUM BROMIDE 10 MG/ML (PF) SYRINGE
PREFILLED_SYRINGE | INTRAVENOUS | Status: DC | PRN
  Administered 2019-02-10: 30 mg via INTRAVENOUS

## 2019-02-10 MED ORDER — MIDAZOLAM HCL 5 MG/5ML IJ SOLN
INTRAMUSCULAR | Status: DC | PRN
  Administered 2019-02-10: 2 mg via INTRAVENOUS

## 2019-02-10 MED ORDER — ONDANSETRON HCL 4 MG/2ML IJ SOLN
INTRAMUSCULAR | Status: AC
  Filled 2019-02-10: qty 2

## 2019-02-10 MED ORDER — FENTANYL CITRATE (PF) 100 MCG/2ML IJ SOLN: 25.0000 ug | INTRAMUSCULAR | Status: DC | PRN

## 2019-02-10 MED ORDER — DEXAMETHASONE SODIUM PHOSPHATE 10 MG/ML IJ SOLN
INTRAMUSCULAR | Status: AC
  Filled 2019-02-10: qty 1

## 2019-02-10 MED ORDER — PROMETHAZINE HCL 25 MG/ML IJ SOLN: 6.2500 mg | INTRAMUSCULAR | Status: DC | PRN

## 2019-02-10 MED ORDER — SUCCINYLCHOLINE CHLORIDE 20 MG/ML IJ SOLN
INTRAMUSCULAR | Status: DC | PRN
  Administered 2019-02-10: 120 mg via INTRAVENOUS

## 2019-02-10 MED ORDER — ALBUTEROL SULFATE HFA 108 (90 BASE) MCG/ACT IN AERS
INHALATION_SPRAY | RESPIRATORY_TRACT | Status: DC | PRN
  Administered 2019-02-10 (×2): 2 via RESPIRATORY_TRACT

## 2019-02-10 MED ORDER — SUGAMMADEX SODIUM 200 MG/2ML IV SOLN
INTRAVENOUS | Status: DC | PRN
  Administered 2019-02-10: 300 mg via INTRAVENOUS

## 2019-02-10 MED ORDER — PHENYLEPHRINE 40 MCG/ML (10ML) SYRINGE FOR IV PUSH (FOR BLOOD PRESSURE SUPPORT)
PREFILLED_SYRINGE | INTRAVENOUS | Status: AC
  Filled 2019-02-10: qty 10

## 2019-02-10 MED ORDER — 0.9 % SODIUM CHLORIDE (POUR BTL) OPTIME
TOPICAL | Status: DC | PRN
  Administered 2019-02-10: 14:00:00 1000 mL

## 2019-02-10 MED ORDER — DEXAMETHASONE SODIUM PHOSPHATE 10 MG/ML IJ SOLN
INTRAMUSCULAR | Status: DC | PRN
  Administered 2019-02-10: 5 mg via INTRAVENOUS

## 2019-02-10 MED ORDER — MEPERIDINE HCL 25 MG/ML IJ SOLN: 6.2500 mg | INTRAMUSCULAR | Status: DC | PRN

## 2019-02-10 MED ORDER — MIDAZOLAM HCL 2 MG/2ML IJ SOLN: 0.5000 mg | Freq: Once | INTRAMUSCULAR | Status: DC | PRN

## 2019-02-10 MED ORDER — FENTANYL CITRATE (PF) 250 MCG/5ML IJ SOLN
INTRAMUSCULAR | Status: DC | PRN
  Administered 2019-02-10: 50 ug via INTRAVENOUS
  Administered 2019-02-10: 100 ug via INTRAVENOUS

## 2019-02-10 MED ORDER — LIDOCAINE 2% (20 MG/ML) 5 ML SYRINGE
INTRAMUSCULAR | Status: AC
  Filled 2019-02-10: qty 10

## 2019-02-10 MED ORDER — LACTATED RINGERS IV SOLN
INTRAVENOUS | Status: DC
  Administered 2019-02-10 (×2): via INTRAVENOUS

## 2019-02-10 SURGICAL SUPPLY — 34 items
ADAPTER VALVE BIOPSY EBUS (MISCELLANEOUS) IMPLANT
ADPTR VALVE BIOPSY EBUS (MISCELLANEOUS)
CANISTER SUCT 3000ML PPV (MISCELLANEOUS) ×3 IMPLANT
CONT SPEC 4OZ CLIKSEAL STRL BL (MISCELLANEOUS) ×3 IMPLANT
COVER BACK TABLE 60X90IN (DRAPES) ×3 IMPLANT
COVER WAND RF STERILE (DRAPES) IMPLANT
FILTER STRAW FLUID ASPIR (MISCELLANEOUS) IMPLANT
FORCEPS BIOP RJ4 1.8 (CUTTING FORCEPS) ×3 IMPLANT
GAUZE SPONGE 4X4 12PLY STRL (GAUZE/BANDAGES/DRESSINGS) ×3 IMPLANT
GLOVE BIOGEL PI IND STRL 6 (GLOVE) ×1 IMPLANT
GLOVE BIOGEL PI INDICATOR 6 (GLOVE) ×2
GLOVE SURG SIGNA 7.5 PF LTX (GLOVE) ×3 IMPLANT
GOWN STRL REUS W/ TWL XL LVL3 (GOWN DISPOSABLE) ×1 IMPLANT
GOWN STRL REUS W/TWL XL LVL3 (GOWN DISPOSABLE) ×2
KIT CLEAN ENDO COMPLIANCE (KITS) ×3 IMPLANT
KIT TURNOVER KIT B (KITS) ×3 IMPLANT
MARKER SKIN DUAL TIP RULER LAB (MISCELLANEOUS) IMPLANT
NS IRRIG 1000ML POUR BTL (IV SOLUTION) ×3 IMPLANT
OIL SILICONE PENTAX (PARTS (SERVICE/REPAIRS)) ×3 IMPLANT
PAD ARMBOARD 7.5X6 YLW CONV (MISCELLANEOUS) ×6 IMPLANT
STOPCOCK MORSE 400PSI 3WAY (MISCELLANEOUS) IMPLANT
SYR 10ML LL (SYRINGE) IMPLANT
SYR 20ML ECCENTRIC (SYRINGE) ×3 IMPLANT
TOWEL GREEN STERILE (TOWEL DISPOSABLE) ×3 IMPLANT
TOWEL GREEN STERILE FF (TOWEL DISPOSABLE) IMPLANT
TOWEL NATURAL 4PK STERILE (DISPOSABLE) IMPLANT
TRAP SPECIMEN MUCOUS 40CC (MISCELLANEOUS) ×3 IMPLANT
TUBE CONNECTING 20'X1/4 (TUBING) ×1
TUBE CONNECTING 20X1/4 (TUBING) ×2 IMPLANT
UNDERPAD 30X30 (UNDERPADS AND DIAPERS) ×3 IMPLANT
VALVE BIOPSY  SINGLE USE (MISCELLANEOUS) ×2
VALVE BIOPSY SINGLE USE (MISCELLANEOUS) ×1 IMPLANT
VALVE SUCTION BRONCHIO DISP (MISCELLANEOUS) ×3 IMPLANT
WATER STERILE IRR 1000ML POUR (IV SOLUTION) ×3 IMPLANT

## 2019-02-10 NOTE — Anesthesia Postprocedure Evaluation (Signed)
Anesthesia Post Note  Patient: Randall Sullivan  Procedure(s) Performed: VIDEO BRONCHOSCOPY WITH REMOVAL OF INTERBRONCHIAL VALVE (IBV) (N/A Chest)     Patient location during evaluation: PACU Anesthesia Type: General Level of consciousness: awake and alert, patient cooperative and oriented Pain management: pain level controlled Vital Signs Assessment: post-procedure vital signs reviewed and stable Respiratory status: spontaneous breathing, nonlabored ventilation, respiratory function stable and patient connected to nasal cannula oxygen Cardiovascular status: blood pressure returned to baseline and stable Postop Assessment: no apparent nausea or vomiting Anesthetic complications: no    Last Vitals:  Vitals:   02/10/19 1442 02/10/19 1500  BP: 111/62 116/69  Pulse: 69 67  Resp: 19 20  Temp:  36.6 C  SpO2: 98% 99%    Last Pain:  Vitals:   02/10/19 1457  TempSrc:   PainSc: 0-No pain                 Ayeisha Lindenberger,E. Rhona Fusilier

## 2019-02-10 NOTE — Op Note (Signed)
NAME: Randall Sullivan, Randall Sullivan MEDICAL RECORD [id number] ACCOUNT [id number] DATE OF BIRTH:June 11, 1951 FACILITY: [facility] LOCATION: [facility] PHYSICIAN:Donice Alperin Chaya Jan, MD  OPERATIVE REPORT  DATE OF PROCEDURE:  02/10/2019  PREOPERATIVE DIAGNOSIS:  Intrabronchial valve placement for prolonged air leak.  POSTOPERATIVE DIAGNOSIS:  Intrabronchial valve placement for prolonged air leak.  PROCEDURE:  Flexible fiberoptic bronchoscopy with removal of intrabronchial valves x4.  SURGEON:  Modesto Charon, MD  ASSISTANT:  None.  ANESTHESIA:  General.  FINDINGS:  Thick white secretions. Valves removed without difficulty.  Minimal granulation tissue.  CLINICAL NOTE:  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  He had a large air leak postoperatively and IBV  were placed on 12/11/2018.  He continued to have a large air leak and required a redo VATS and was found to have a ruptured bleb in the superior segment of the lower lobe.  He ultimately had his chest tube removed as an outpatient.  He now presents for  removal of intrabronchial valves.  The indications, risks, benefits, and alternatives were discussed in detail with the patient.  He understood and accepted the risks and agreed to proceed.  DESCRIPTION OF PROCEDURE:  The patient was brought to the operating room on 02/10/2019.  He had induction of general anesthesia and was intubated.  A timeout was performed.  Flexible fiberoptic bronchoscopy was performed.  The left upper lobe bronchial  stump was well healed.  There were minimal secretions on the left side.  On the right side, there were thick white secretions that cleared with saline.  The superior segmental valve was identified.  The stem was grasped with the biopsy forceps and the  valve was removed without difficulty.  After each valve was removed, a  segmental bronchus was again irrigated with saline to help clear retained secretions in that area.  Next, the upper lobe was inspected and once the secretions were cleared, all 3  valves from the segmental bronchi were easily identified.  The valves were all removed intact by grasping the stem and pulling with gentle traction.  Again, there was minimal granulation tissue and minimal blood noted with removal of the valves.  The  right upper lobe, then was again irrigated with saline.  There was no ongoing bleeding.  The bronchoscope was removed.    The patient was extubated in the operating room and taken to the [facility] in good condition.  AN/NUANCE  D:02/10/2019 T:02/10/2019 JOB:[id number]

## 2019-02-10 NOTE — Interval H&P Note (Signed)
History and Physical Interval Note:  02/10/2019 1:28 PM  Randall Sullivan  has presented today for surgery, with the diagnosis of INTERBRONCHIAL VALVES.  The various methods of treatment have been discussed with the patient and family. After consideration of risks, benefits and other options for treatment, the patient has consented to  Procedure(s): VIDEO BRONCHOSCOPY WITH REMOVAL OF INTERBRONCHIAL VALVE (IBV) (N/A) as a surgical intervention.  The patient's history has been reviewed, patient examined, no change in status, stable for surgery.  I have reviewed the patient's chart and labs.  Questions were answered to the patient's satisfaction.     [name]

## 2019-02-10 NOTE — Anesthesia Procedure Notes (Signed)
Procedure Name: Intubation Date/Time: 02/10/2019 1:46 PM Performed by: Oletta Lamas, CRNA Pre-anesthesia Checklist: Patient identified, Emergency Drugs available, Suction available and Patient being monitored Patient Re-evaluated:Patient Re-evaluated prior to induction Oxygen Delivery Method: Circle System Utilized Preoxygenation: Pre-oxygenation with 100% oxygen Induction Type: IV induction and Cricoid Pressure applied Ventilation: Mask ventilation without difficulty Laryngoscope Size: Miller and 2 Grade View: Grade I Tube type: Oral Tube size: 8.5 mm Number of attempts: 1 Airway Equipment and Method: Stylet Placement Confirmation: ETT inserted through vocal cords under direct vision,  positive ETCO2 and breath sounds checked- equal and bilateral Secured at: 23 cm Tube secured with: Tape Dental Injury: Teeth and Oropharynx as per pre-operative assessment

## 2019-02-10 NOTE — Discharge Instructions (Addendum)
Do not drive or engage in heavy physical activity for 24 hours  You may cough up small amounts of blood over the next few days  You may use acetaminophen(Tylenol) and/or the oxycodone you already have if needed for discomfort  Call [phone] if you develop chest pain, shortness of breath or fever > 101 F or cough up more than 2 tablespoons of blood  My office will contact you with follow up information

## 2019-02-10 NOTE — Brief Op Note (Addendum)
02/10/2019  2:35 PM  PATIENT:  Randall Sullivan  68 y.o. male  PRE-OPERATIVE DIAGNOSIS:  INTERBRONCHIAL VALVES  POST-OPERATIVE DIAGNOSIS:  INTERBRONCHIAL VALVES  PROCEDURE:  Procedure(s): VIDEO BRONCHOSCOPY WITH REMOVAL OF INTERBRONCHIAL VALVE (IBV) (N/A) x 4  SURGEON:  Surgeon(s) and Role:    * [name], MD - Primary  PHYSICIAN ASSISTANT:   ASSISTANTS: none   ANESTHESIA:   general  EBL:  minimal   BLOOD ADMINISTERED:none  DRAINS: none   LOCAL MEDICATIONS USED:  NONE  SPECIMEN:  No Specimen  DISPOSITION OF SPECIMEN:  N/A  COUNTS:  NO endo  TOURNIQUET:  * No tourniquets in log *  DICTATION: .Other Dictation: Dictation Number -  PLAN OF CARE: Discharge to home after PACU  PATIENT DISPOSITION:  PACU - hemodynamically stable.   Delay start of Pharmacological VTE agent (>24hrs) due to surgical blood loss or risk of bleeding: not applicable  Dictation # [id number]

## 2019-02-10 NOTE — Transfer of Care (Signed)
Immediate Anesthesia Transfer of Care Note  Patient: Randall Sullivan  Procedure(s) Performed: VIDEO BRONCHOSCOPY WITH REMOVAL OF INTERBRONCHIAL VALVE (IBV) (N/A Chest)  Patient Location: PACU  Anesthesia Type:General  Level of Consciousness: drowsy and responds to stimulation.   Sleeping with eyes closed.  RR even and unlabored.  No evidence of pain or discomforts.    Airway & Oxygen Therapy: Patient Spontanous Breathing and Patient connected to nasal cannula oxygen  Post-op Assessment: Report given to RN and Post -op Vital signs reviewed and stable  Post vital signs: Reviewed and stable  Last Vitals:  Vitals Value Taken Time  BP 95/63 02/10/2019  2:27 PM  Temp    Pulse 63 02/10/2019  2:29 PM  Resp 14 02/10/2019  2:29 PM  SpO2 97 % 02/10/2019  2:29 PM  Vitals shown include unvalidated device data.  Last Pain:  Vitals:   02/10/19 1122  TempSrc:   PainSc: 0-No pain      Patients Stated Pain Goal: 4 (11/57/26 2035)  Complications: No apparent anesthesia complications

## 2019-02-11 ENCOUNTER — Encounter (HOSPITAL_COMMUNITY): Payer: Self-pay | Admitting: Thoracic Surgery (Cardiothoracic Vascular Surgery)

## 2019-02-15 ENCOUNTER — Ambulatory Visit (HOSPITAL_COMMUNITY): Admission: RE | Admit: 2019-02-15 | Payer: Medicare Other | Source: Ambulatory Visit

## 2019-02-15 ENCOUNTER — Other Ambulatory Visit: Payer: Medicare Other

## 2019-02-16 DIAGNOSIS — J439 Emphysema, unspecified: Secondary | ICD-10-CM | POA: Diagnosis not present

## 2019-02-16 DIAGNOSIS — Z438 Encounter for attention to other artificial openings: Secondary | ICD-10-CM | POA: Diagnosis not present

## 2019-02-16 DIAGNOSIS — J9382 Other air leak: Secondary | ICD-10-CM | POA: Diagnosis not present

## 2019-02-16 DIAGNOSIS — Z9981 Dependence on supplemental oxygen: Secondary | ICD-10-CM | POA: Diagnosis not present

## 2019-02-17 ENCOUNTER — Ambulatory Visit: Payer: Medicare Other | Admitting: Internal Medicine

## 2019-02-24 DIAGNOSIS — Z9981 Dependence on supplemental oxygen: Secondary | ICD-10-CM | POA: Diagnosis not present

## 2019-02-24 DIAGNOSIS — J9382 Other air leak: Secondary | ICD-10-CM | POA: Diagnosis not present

## 2019-02-24 DIAGNOSIS — Z438 Encounter for attention to other artificial openings: Secondary | ICD-10-CM | POA: Diagnosis not present

## 2019-02-24 DIAGNOSIS — J439 Emphysema, unspecified: Secondary | ICD-10-CM | POA: Diagnosis not present

## 2019-02-25 ENCOUNTER — Other Ambulatory Visit: Payer: Self-pay | Admitting: Thoracic Surgery (Cardiothoracic Vascular Surgery)

## 2019-02-25 DIAGNOSIS — C3492 Malignant neoplasm of unspecified part of left bronchus or lung: Secondary | ICD-10-CM

## 2019-03-01 ENCOUNTER — Other Ambulatory Visit: Payer: Self-pay

## 2019-03-01 ENCOUNTER — Ambulatory Visit: Payer: Self-pay

## 2019-03-02 ENCOUNTER — Ambulatory Visit (HOSPITAL_COMMUNITY)
Admission: RE | Admit: 2019-03-02 | Discharge: 2019-03-02 | Disposition: A | Discharge status: Home / Self Care | Payer: Medicare Other | Source: Ambulatory Visit | Attending: Internal Medicine | Admitting: Internal Medicine

## 2019-03-02 ENCOUNTER — Inpatient Hospital Stay: Payer: Medicare Other | Attending: Internal Medicine

## 2019-03-02 ENCOUNTER — Ambulatory Visit
Admission: RE | Admit: 2019-03-02 | Discharge: 2019-03-02 | Disposition: A | Discharge status: Home / Self Care | Payer: Medicare Other | Source: Ambulatory Visit | Attending: Thoracic Surgery (Cardiothoracic Vascular Surgery) | Admitting: Thoracic Surgery (Cardiothoracic Vascular Surgery)

## 2019-03-02 ENCOUNTER — Ambulatory Visit (INDEPENDENT_AMBULATORY_CARE_PROVIDER_SITE_OTHER): Payer: Self-pay | Admitting: Surgical

## 2019-03-02 ENCOUNTER — Other Ambulatory Visit: Payer: Self-pay

## 2019-03-02 VITALS — BP 148/73 | HR 60 | Temp 97.3°F | Resp 20 | Ht 70.0 in | Wt 179.6 lb

## 2019-03-02 DIAGNOSIS — Z902 Acquired absence of lung [part of]: Secondary | ICD-10-CM

## 2019-03-02 DIAGNOSIS — C3411 Malignant neoplasm of upper lobe, right bronchus or lung: Secondary | ICD-10-CM | POA: Diagnosis not present

## 2019-03-02 DIAGNOSIS — J95812 Postprocedural air leak: Secondary | ICD-10-CM

## 2019-03-02 DIAGNOSIS — J432 Centrilobular emphysema: Secondary | ICD-10-CM | POA: Diagnosis not present

## 2019-03-02 DIAGNOSIS — C3490 Malignant neoplasm of unspecified part of unspecified bronchus or lung: Secondary | ICD-10-CM | POA: Diagnosis not present

## 2019-03-02 DIAGNOSIS — C3492 Malignant neoplasm of unspecified part of left bronchus or lung: Secondary | ICD-10-CM

## 2019-03-02 DIAGNOSIS — Z9889 Other specified postprocedural states: Secondary | ICD-10-CM

## 2019-03-02 DIAGNOSIS — J984 Other disorders of lung: Secondary | ICD-10-CM | POA: Diagnosis not present

## 2019-03-02 DIAGNOSIS — J90 Pleural effusion, not elsewhere classified: Secondary | ICD-10-CM | POA: Diagnosis not present

## 2019-03-02 LAB — CBC WITH DIFFERENTIAL (CANCER CENTER ONLY)
Abs Immature Granulocytes: 0.04 10*3/uL (ref 0.00–0.07)
Basophils Absolute: 0 10*3/uL (ref 0.0–0.1)
Basophils Relative: 0 %
Eosinophils Absolute: 0.1 10*3/uL (ref 0.0–0.5)
Eosinophils Relative: 2 %
HCT: 38.5 % — ABNORMAL LOW (ref 39.0–52.0)
Hemoglobin: 11.6 g/dL — ABNORMAL LOW (ref 13.0–17.0)
Immature Granulocytes: 1 %
Lymphocytes Relative: 19 %
Lymphs Abs: 1.4 10*3/uL (ref 0.7–4.0)
MCH: 25.8 pg — ABNORMAL LOW (ref 26.0–34.0)
MCHC: 30.1 g/dL (ref 30.0–36.0)
MCV: 85.6 fL (ref 80.0–100.0)
Monocytes Absolute: 0.8 10*3/uL (ref 0.1–1.0)
Monocytes Relative: 11 %
Neutro Abs: 5 10*3/uL (ref 1.7–7.7)
Neutrophils Relative %: 67 %
Platelet Count: 217 10*3/uL (ref 150–400)
RBC: 4.5 MIL/uL (ref 4.22–5.81)
RDW: 18.4 % — ABNORMAL HIGH (ref 11.5–15.5)
WBC Count: 7.4 10*3/uL (ref 4.0–10.5)
nRBC: 0 % (ref 0.0–0.2)

## 2019-03-02 LAB — CMP (CANCER CENTER ONLY)
ALT: 17 U/L (ref 0–44)
AST: 17 U/L (ref 15–41)
Albumin: 3.7 g/dL (ref 3.5–5.0)
Alkaline Phosphatase: 53 U/L (ref 38–126)
Anion gap: 9 (ref 5–15)
BUN: 15 mg/dL (ref 8–23)
CO2: 29 mmol/L (ref 22–32)
Calcium: 9.6 mg/dL (ref 8.9–10.3)
Chloride: 100 mmol/L (ref 98–111)
Creatinine: 0.81 mg/dL (ref 0.61–1.24)
GFR, Est AFR Am: >60 mL/min (ref >60)
GFR, Estimated: >60 mL/min (ref >60)
Glucose, Bld: 90 mg/dL (ref 70–99)
Potassium: 4.7 mmol/L (ref 3.5–5.1)
Sodium: 138 mmol/L (ref 135–145)
Total Bilirubin: 0.5 mg/dL (ref 0.3–1.2)
Total Protein: 7.6 g/dL (ref 6.5–8.1)

## 2019-03-02 MED ORDER — IOHEXOL 300 MG/ML  SOLN
75.0000 mL | Freq: Once | INTRAMUSCULAR | Status: AC | PRN
  Administered 2019-03-02: 75 mL via INTRAVENOUS

## 2019-03-02 MED ORDER — SODIUM CHLORIDE (PF) 0.9 % IJ SOLN
INTRAMUSCULAR | Status: AC
  Filled 2019-03-02: qty 50

## 2019-03-02 NOTE — Progress Notes (Signed)
[address]       [place], [address]             [phone]      Randall Sullivan [facility] Medical Record #[id number] Date of Birth: [birth year]  Referring: Curt Bears, MD Primary Care: Brigitte Pulse, DO Primary Cardiologist: No primary care provider on file.   Chief Complaint:   POST OP FOLLOW UP OPERATIVE REPORT  DATE OF PROCEDURE:  12/09/2018  PREOPERATIVE DIAGNOSIS:  Right upper lobe lung nodule and blebs.  POSTOPERATIVE DIAGNOSES:  Adenocarcinoma, right upper lobe, clinical stage IA (T1, N0), right upper lobe blebs.  PROCEDURES:   Right video-assisted thoracoscopy, Wedge resection right upper lobe nodule, Bleb resection and Intercostal nerve blocks at levels 3 through 9.  SURGEON:  Modesto Charon, MD  ASSISTANT:  Ellwood Handler, PA  ANESTHESIA:  General.     OPERATIVE REPORT  DATE OF PROCEDURE:  12/11/2018  PREOPERATIVE DIAGNOSIS:  Severe air leak after wedge resection.  POSTOPERATIVE DIAGNOSIS:  Severe air leak after wedge resection.  PROCEDURE:  Bronchoscopy with endobronchial valve placement in anterior, apical, and posterior segments of right upper lobe and superior segment of right lower lobe.  SURGEON:  Modesto Charon, MD  ASSISTANT:  None.  ANESTHESIA:  General.  FINDINGS:  Marked decrease an air leak, but not completely resolved after valve placement.    OPERATIVE REPORT  DATE OF PROCEDURE:  12/21/2018  PREOPERATIVE DIAGNOSIS:  Prolonged air leak after video-assisted thoracoscopic surgery.     POSTOPERATIVE DIAGNOSIS:  Ruptured bleb superior segment right lower lobe.  PROCEDURE:   Redo right video-assisted thoracoscopy, Stapling of bleb superior segment right lower lobe, and Bronchoscopy.  SURGEON:  Modesto Charon, MD  ASSISTANT:  Ellwood Handler, PA  ANESTHESIA:  General.  FINDINGS:  Ruptured bleb in superior segment right lower lobe.  No air leak demonstrable  after stapling of bleb.  Bronchoscopy revealed valve in posterior segmental bronchus of the right upper lobe was not seated well.  The remaining valves were well  seated.   OPERATIVE REPORT  DATE OF PROCEDURE:  02/10/2019  PREOPERATIVE DIAGNOSIS:  Intrabronchial valve placement for prolonged air leak.  POSTOPERATIVE DIAGNOSIS:  Intrabronchial valve placement for prolonged air leak.  PROCEDURE:  Flexible fiberoptic bronchoscopy with removal of intrabronchial valves x4.  SURGEON:  Modesto Charon, MD  ASSISTANT:  None.  ANESTHESIA:  General.  FINDINGS:  Thick white secretions. Valves removed without difficulty.  Minimal granulation tissue.   History of Present Illness:    The patient is a 68 year old male status post the above described procedure seen in the office on today's date for postsurgical follow-up.  He does report that he is showing steady overall improvement in his activity and energy level.  He does have a mildly productive cough of clear sputum.  He has mild chronic shortness of breath. He denies fevers or chills.[name]  He is on home oxygen at 2 L.  He has had no difficulty with his incisions healing.  Overall his progress does appear to be slow, but steady.      Past Medical History:  Diagnosis Date   A-fib ([place]) 2017   after last lung surgery patient had a history if Afib documented in hospital    Arthritis    back & legs ( knees)   Cancer ([place])    lung   Complication of anesthesia    agitated upon waking from anesth.    Emphysema    Emphysema of lung (  [place])    History of lung cancer    Hypertension    Oxygen dependent    2L continuous   Tobacco abuse      Social History   Tobacco Use  Smoking Status Former Smoker   Packs/day: 1.00   Years: 40.00   Pack years: 40.00   Types: Cigarettes   Last attempt to quit: 05/28/2016   Years since quitting: 2.7  Smokeless Tobacco Never Used    Social History   Substance and Sexual  Activity  Alcohol Use Not Currently   Comment: quit- 2016     Allergies  Allergen Reactions   No Known Allergies     Current Outpatient Medications  Medication Sig Dispense Refill   acetaminophen (TYLENOL) 500 MG tablet Take 1,000 mg by mouth every 6 (six) hours as needed (for arthritis pain.).      albuterol (PROVENTIL) (2.5 MG/3ML) 0.083% nebulizer solution Take 2.5 mg by nebulization every 6 (six) hours as needed for wheezing or shortness of breath.   5   aspirin EC 81 MG tablet Take 81 mg by mouth daily.     cloNIDine (CATAPRES) 0.1 MG tablet Take 1 tablet (0.1 mg total) by mouth daily. 30 tablet 3   diltiazem (CARDIZEM SR) 60 MG 12 hr capsule Take 1 capsule (60 mg total) by mouth every 12 (twelve) hours. 60 capsule 3   fexofenadine (ALLEGRA) 180 MG tablet Take 180 mg by mouth daily as needed for allergies or rhinitis.     guaiFENesin (MUCINEX) 600 MG 12 hr tablet Take 1 tablet (600 mg total) by mouth 2 (two) times daily as needed. 60 tablet 3   meclizine (ANTIVERT) 25 MG tablet Take 1 tablet (25 mg total) by mouth 3 (three) times daily as needed for dizziness. May cause drowsiness 30 tablet 0   metoprolol succinate (TOPROL-XL) 50 MG 24 hr tablet Take 50 mg by mouth daily. Take with or immediately following a meal.     OXYGEN Inhale 2-3 L into the lungs continuous. Hx of lung ca, copd, and emphysema     No current facility-administered medications for this visit.    Facility-Administered Medications Ordered in Other Visits  Medication Dose Route Frequency Provider Last Rate Last Dose   sodium chloride (PF) 0.9 % injection                Physical Exam: BP (!) 148/73 (BP Location: Right Arm, Patient Position: Sitting, Cuff Size: Normal)    Pulse 60    Temp (!) 97.3 F (36.3 C) (Skin)    Resp 20    Ht 5\' 10"  (1.778 m)    Wt 81.5 kg    SpO2 98% Comment: ON 2L O2   BMI 25.77 kg/m   General appearance: alert, cooperative and no distress Heart: regular rate and  rhythm Lungs: clear to auscultation bilaterally Abdomen: soft, mild diffuse tenderness Extremities: no edema Wound: incis well healed   Diagnostic Studies & Laboratory data:     Recent Radiology Findings:   Dg Chest 2 View  Result Date: 03/02/2019 CLINICAL DATA:  History of VATS. EXAM: CHEST - 2 VIEW COMPARISON:  CT 03/02/2019.  Chest x-ray 02/10/2019, 01/27/2019. FINDINGS: Mediastinum and hilar structures normal. Heart size normal. Previously identified right base infiltrate has almost completely cleared. Bilateral pleural-parenchymal thickening noted consistent scarring. Surgical sutures noted over the right chest. IMPRESSION: Postsurgical changes right lung. Pleural-parenchymal scarring. Interval near complete clearing of right base infiltrate. Electronically Signed   By: Marcello Moores  Register  On: 03/02/2019 12:57      Recent Lab Findings: Lab Results  Component Value Date   WBC 7.4 03/02/2019   HGB 11.6 (L) 03/02/2019   HCT 38.5 (L) 03/02/2019   PLT 217 03/02/2019   GLUCOSE 90 03/02/2019   ALT 17 03/02/2019   AST 17 03/02/2019   NA 138 03/02/2019   K 4.7 03/02/2019   CL 100 03/02/2019   CREATININE 0.81 03/02/2019   BUN 15 03/02/2019   CO2 29 03/02/2019   TSH 1.520 08/08/2016   INR 1.1 02/05/2019      Assessment / Plan: The patient continues to make steady overall progress.  His CT scan was reviewed by Dr. Roxan Hockey and myself and we are waiting for the radiologist reading but it appears to be stable in appearance.  He is scheduled to see Dr. Earlie Server this week.  We will have the nurse contact home health to begin to attempt to wean his home oxygen.  I did not make any changes to his current medication regimen.  We will see him in the office in approximately 1 month with a repeat chest x-ray.      Medication Changes: No orders of the defined types were placed in this encounter.     Randall Giovanni, PA-C 03/02/2019 1:47 PM

## 2019-03-02 NOTE — Patient Instructions (Signed)
Wean O2 per [facility] assistance

## 2019-03-03 ENCOUNTER — Telehealth: Payer: Self-pay | Admitting: Internal Medicine

## 2019-03-03 DIAGNOSIS — Z9981 Dependence on supplemental oxygen: Secondary | ICD-10-CM | POA: Diagnosis not present

## 2019-03-03 DIAGNOSIS — J439 Emphysema, unspecified: Secondary | ICD-10-CM | POA: Diagnosis not present

## 2019-03-03 DIAGNOSIS — Z438 Encounter for attention to other artificial openings: Secondary | ICD-10-CM | POA: Diagnosis not present

## 2019-03-03 DIAGNOSIS — J9382 Other air leak: Secondary | ICD-10-CM | POA: Diagnosis not present

## 2019-03-04 ENCOUNTER — Inpatient Hospital Stay (HOSPITAL_BASED_OUTPATIENT_CLINIC_OR_DEPARTMENT_OTHER): Payer: Medicare Other | Admitting: Internal Medicine

## 2019-03-04 ENCOUNTER — Encounter: Payer: Self-pay | Admitting: Internal Medicine

## 2019-03-04 DIAGNOSIS — C3490 Malignant neoplasm of unspecified part of unspecified bronchus or lung: Secondary | ICD-10-CM

## 2019-03-04 DIAGNOSIS — C3492 Malignant neoplasm of unspecified part of left bronchus or lung: Secondary | ICD-10-CM

## 2019-03-04 DIAGNOSIS — Z902 Acquired absence of lung [part of]: Secondary | ICD-10-CM | POA: Diagnosis not present

## 2019-03-04 DIAGNOSIS — Z79899 Other long term (current) drug therapy: Secondary | ICD-10-CM | POA: Diagnosis not present

## 2019-03-04 DIAGNOSIS — Z7982 Long term (current) use of aspirin: Secondary | ICD-10-CM | POA: Diagnosis not present

## 2019-03-04 NOTE — Progress Notes (Signed)
[facility] [phone]   [phone]  PROGRESS NOTE FOR TELEMEDICINE VISITS  Randall Sullivan, [place] [address]  I connected with@ on 03/04/19 at 11:00 AM EDT by telephone visit and verified that I am speaking with the correct person using two identifiers.   I discussed the limitations, risks, security and privacy concerns of performing an evaluation and management service by telemedicine and the availability of in-person appointments. I also discussed with the patient that there may be a patient responsible charge related to this service. The patient expressed understanding and agreed to proceed.  Other persons participating in the visit and their role in the encounter: His wife  Patient's location: Home Provider's location: [place]  DIAGNOSIS:  1) Stage IA (T1b, N0, M0) non-small cell lung cancer, well-differentiated adenocarcinoma presented with left upper lobeS pulmonary nodule diagnosed in September 2017. 2) stage Ia non-small cell lung cancer diagnosed in March 2020.  PRIOR THERAPY:  1) Status post left VATS, left upper lobectomy with mediastinal lymph node dissection under the care of Dr. Roxan Hockey on 08/01/2016. 2) status post right VATS with wedge resection of right upper lobe nodule and bleb resection under the care of Dr. Roxan Hockey on December 10, 2018.   3) Status post bronchoscopy with endobronchial valve replacement in the anterior apical and posterior segments of right upper lobe and superior segment of right lower lobe  CURRENT THERAPY: Observation.  INTERVAL HISTORY: Randall Sullivan 68 y.o. male has a telephone virtual visit with me today for evaluation and discussion of his scan results.  The patient is feeling fine today with no concerning complaints.  He recently underwent right VATS with wedge resection of the right upper lobe nodule under the care of Dr. Roxan Hockey.  He also had a  bronchial valve placement that was removed recently.  The patient is recovering slowly from his surgery.  He denied having any current chest pain but has shortness of breath with exertion with mild cough and no hemoptysis.  He denied having any fever or chills.  He has no nausea, vomiting, diarrhea or constipation.  He denied having any headache or visual changes.  He had repeat CT scan of the chest performed recently and is having the visit for evaluation and discussion of his scan results.  MEDICAL HISTORY: Past Medical History:  Diagnosis Date   A-fib [facility]) 2017   after last lung surgery patient had a history if Afib documented in hospital    Arthritis    back & legs ( knees)   Cancer ([place])    lung   Complication of anesthesia    agitated upon waking from anesth.    Emphysema    Emphysema of lung ([place])    History of lung cancer    Hypertension    Oxygen dependent    2L continuous   Tobacco abuse     ALLERGIES:  is allergic to no known allergies.  MEDICATIONS:  Current Outpatient Medications  Medication Sig Dispense Refill   acetaminophen (TYLENOL) 500 MG tablet Take 1,000 mg by mouth every 6 (six) hours as needed (for arthritis pain.).      albuterol (PROVENTIL) (2.5 MG/3ML) 0.083% nebulizer solution Take 2.5 mg by nebulization every 6 (six) hours as needed for wheezing or shortness of breath.   5   aspirin EC 81 MG tablet Take 81 mg by mouth daily.     cloNIDine (CATAPRES) 0.1 MG tablet Take 1 tablet (0.1 mg total)  by mouth daily. 30 tablet 3   diltiazem (CARDIZEM SR) 60 MG 12 hr capsule Take 1 capsule (60 mg total) by mouth every 12 (twelve) hours. 60 capsule 3   fexofenadine (ALLEGRA) 180 MG tablet Take 180 mg by mouth daily as needed for allergies or rhinitis.     guaiFENesin (MUCINEX) 600 MG 12 hr tablet Take 1 tablet (600 mg total) by mouth 2 (two) times daily as needed. 60 tablet 3   meclizine (ANTIVERT) 25 MG tablet Take 1 tablet (25 mg total) by mouth 3  (three) times daily as needed for dizziness. May cause drowsiness 30 tablet 0   metoprolol succinate (TOPROL-XL) 50 MG 24 hr tablet Take 50 mg by mouth daily. Take with or immediately following a meal.     OXYGEN Inhale 2-3 L into the lungs continuous. Hx of lung ca, copd, and emphysema     No current facility-administered medications for this visit.     SURGICAL HISTORY:  Past Surgical History:  Procedure Laterality Date   HERNIA REPAIR Bilateral 7782   umbilical   VIDEO ASSISTED THORACOSCOPY Right 12/21/2018   Procedure: VIDEO ASSISTED THORACOSCOPY AND RESECTION OF BLEBS RIGHT LOWER LOBE;  Surgeon: [name], MD;  Location: [facility];  Service: Thoracic;  Laterality: Right;   VIDEO ASSISTED THORACOSCOPY (VATS)/ LOBECTOMY Left 08/01/2016   Procedure: VIDEO ASSISTED THORACOSCOPY (VATS)/ LEFT UPPER LOBECTOMY with lymph node sampling and onQ placement;  Surgeon: [name], MD;  Location: [facility];  Service: Thoracic;  Laterality: Left;   VIDEO ASSISTED THORACOSCOPY (VATS)/WEDGE RESECTION Right 12/09/2018   Procedure: VIDEO ASSISTED THORACOSCOPY (VATS)/WEDGE RESECTION;  Surgeon: [name], MD;  Location: [facility];  Service: Thoracic;  Laterality: Right;   VIDEO BRONCHOSCOPY Bilateral 12/21/2018   Procedure: VIDEO BRONCHOSCOPY;  Surgeon: [name], MD;  Location: [facility];  Service: Thoracic;  Laterality: Bilateral;   VIDEO BRONCHOSCOPY WITH ENDOBRONCHIAL NAVIGATION N/A 07/03/2016   Procedure: VIDEO BRONCHOSCOPY WITH ENDOBRONCHIAL NAVIGATION;  Surgeon: [name], MD;  Location: [facility];  Service: Thoracic;  Laterality: N/A;   VIDEO BRONCHOSCOPY WITH INSERTION OF INTERBRONCHIAL VALVE (IBV) N/A 12/11/2018   Procedure: VIDEO BRONCHOSCOPY WITH INSERTION OF INTERBRONCHIAL VALVE (IBV);  Surgeon: [name], MD;  Location: [facility];  Service: Thoracic;  Laterality: N/A;   VIDEO BRONCHOSCOPY WITH INSERTION OF INTERBRONCHIAL VALVE (IBV) N/A 02/10/2019     Procedure: VIDEO BRONCHOSCOPY WITH REMOVAL OF INTERBRONCHIAL VALVE (IBV);  Surgeon: [name], MD;  Location: [facility] OR;  Service: Thoracic;  Laterality: N/A;    REVIEW OF SYSTEMS:  A comprehensive review of systems was negative except for: Constitutional: positive for fatigue Respiratory: positive for cough and dyspnea on exertion   LABORATORY DATA: Lab Results  Component Value Date   WBC 7.4 03/02/2019   HGB 11.6 (L) 03/02/2019   HCT 38.5 (L) 03/02/2019   MCV 85.6 03/02/2019   PLT 217 03/02/2019      Chemistry      Component Value Date/Time   NA 138 03/02/2019 1031   NA 136 04/09/2017 1249   K 4.7 03/02/2019 1031   K 4.6 04/09/2017 1249   CL 100 03/02/2019 1031   CO2 29 03/02/2019 1031   CO2 27 04/09/2017 1249   BUN 15 03/02/2019 1031   BUN 23.9 04/09/2017 1249   CREATININE 0.81 03/02/2019 1031   CREATININE 1.4 (H) 04/09/2017 1249      Component Value Date/Time   CALCIUM 9.6 03/02/2019 1031   CALCIUM 9.6 04/09/2017  1249   ALKPHOS 53 03/02/2019 1031   ALKPHOS 58 04/09/2017 1249   AST 17 03/02/2019 1031   AST 13 04/09/2017 1249   ALT 17 03/02/2019 1031   ALT 9 04/09/2017 1249   BILITOT 0.5 03/02/2019 1031   BILITOT 1.01 04/09/2017 1249       RADIOGRAPHIC STUDIES: Dg Chest 2 View  Result Date: 03/02/2019 CLINICAL DATA:  History of VATS. EXAM: CHEST - 2 VIEW COMPARISON:  CT 03/02/2019.  Chest x-ray 02/10/2019, 01/27/2019. FINDINGS: Mediastinum and hilar structures normal. Heart size normal. Previously identified right base infiltrate has almost completely cleared. Bilateral pleural-parenchymal thickening noted consistent scarring. Surgical sutures noted over the right chest. IMPRESSION: Postsurgical changes right lung. Pleural-parenchymal scarring. Interval near complete clearing of right base infiltrate. Electronically Signed   By: Marcello Moores  Register   On: 03/02/2019 12:57   Dg Chest 2 View  Result Date: 02/10/2019 CLINICAL DATA:  Preop. EXAM: CHEST - 2  VIEW COMPARISON:  02/02/2019 FINDINGS: Postoperative changes on the right. No pneumothorax. Patchy opacity at the right lung base is stable. Left basilar scarring. Small right effusion. IMPRESSION: No significant change since prior study.  No visible pneumothorax. Electronically Signed   By: Rolm Baptise M.D.   On: 02/10/2019 12:06   Ct Chest W Contrast  Result Date: 03/02/2019 CLINICAL DATA:  Stage IA left upper lobe lung adenocarcinoma status post left upper lobectomy 08/01/2016. Status post right upper lobe wedge resection for lung adenocarcinoma 12/09/2018. Restaging. EXAM: CT CHEST WITH CONTRAST TECHNIQUE: Multidetector CT imaging of the chest was performed during intravenous contrast administration. CONTRAST:  33mL OMNIPAQUE IOHEXOL 300 MG/ML  SOLN COMPARISON:  10/15/2018 PET-CT. 09/25/2018 chest CT. 02/10/2019 chest radiograph. FINDINGS: Cardiovascular: Normal heart size. No significant pericardial effusion/thickening. Three-vessel coronary atherosclerosis. Atherosclerotic nonaneurysmal thoracic aorta. Normal caliber pulmonary arteries. No central pulmonary emboli. Mediastinum/Nodes: No discrete thyroid nodules. Unremarkable esophagus. No axillary adenopathy. Newly enlarged 1.3 cm right posterior paratracheal node (series 2/image 34). Newly mildly enlarged 1.1 cm right hilar node (series 2/image 64). No additional pathologically enlarged mediastinal or hilar nodes. Lungs/Pleura: No pneumothorax. Trace basilar right pleural effusion. No left pleural effusion. Moderate centrilobular and paraseptal emphysema with diffuse bronchial wall thickening. Status post left upper lobectomy and right upper lobe wedge resection. Thick curvilinear bandlike soft tissue along the right upper lobe wedge resection suture line measures 2.1 cm thickness (series 5/image 42). There is patchy ground-glass opacity and ill-defined patchy peribronchovascular nodular consolidation throughout the right lower lobe, with a dominant 1.6  cm right lower lobe peribronchovascular nodular focus (series 5/image 102), new since 09/25/2018 chest CT. No acute consolidative airspace disease or significant pulmonary nodules in left lung. Upper abdomen: Tiny hiatal hernia. Musculoskeletal: No aggressive appearing focal osseous lesions. Mild thoracic spondylosis. IMPRESSION: 1. New indistinct patchy nodular foci of peribronchovascular consolidation and patchy ground-glass opacity throughout the right lower lung lobe, indeterminate for pneumonia or recurrent malignancy. 2. New mild right hilar and right paratracheal lymphadenopathy, indeterminate for reactive versus metastatic nodes. 3. Thick curvilinear bandlike soft tissue along the right upper lobe wedge resection suture line, nonspecific, potentially evolving post surgical scarring, with recurrent tumor not entirely excluded. This study will serve as a new postoperative baseline. Attention on follow-up chest CT recommended. 4. Depending on level of clinical suspicion, management options include further characterization of these findings with PET-CT at this time, tissue sampling and/or follow-up chest CT in 3 months. 5. Trace basilar right pleural effusion. Aortic Atherosclerosis (ICD10-I70.0) and Emphysema (ICD10-J43.9). Electronically Signed   By:  Ilona Sorrel M.D.   On: 03/02/2019 16:08   Dg Chest Port 1 View  Result Date: 02/10/2019 CLINICAL DATA:  Status post bronchoscopy.  History of hypertension. EXAM: PORTABLE CHEST 1 VIEW COMPARISON:  02/10/2019 FINDINGS: A surgical staple line is noted coursing across the right upper lobe. The previously noted endobronchial plugs are poorly visualized on this exam. There are similar airspace opacities at the lung bases bilaterally, right worse than left. Emphysematous changes are again noted. IMPRESSION: Stable appearance of the chest with no evidence of a pneumothorax. Electronically Signed   By: Constance Holster M.D.   On: 02/10/2019 14:51    ASSESSMENT  AND PLAN: This is a very pleasant 68 years old white male with a stage IA non-small cell lung cancer status post left lower lobectomy with lymph node dissection under the care of Dr. Roxan Hockey in November 2017 and has been observation since that time. He was also diagnosed with a stage Ia non-small cell lung cancer in March 2020 and the patient underwent wedge resection of the upper lobe in March 2020. He is recovering slowly from his surgery. He had repeat CT scan of the chest performed recently.  His a scan showed no concerning findings for disease recurrence or progression but there was new patchy nodular foci of peribronchovascular consolidation and patchy groundglass opacities to the right lower lobe in addition to new mild right hilar and right paratracheal lymphadenopathy likely reactive in nature but malignancy could not be excluded. I discussed the scan results with the patient and his wife and recommended for him to continue on observation with repeat CT scan of the chest in 3 months for further evaluation of his disease. The patient was advised to call immediately if he has any concerning symptoms in the interval. I discussed the assessment and treatment plan with the patient. The patient was provided an opportunity to ask questions and all were answered. The patient agreed with the plan and demonstrated an understanding of the instructions.   The patient was advised to call back or seek an in-person evaluation if the symptoms worsen or if the condition fails to improve as anticipated.  I provided 11 minutes of non face-to-face telephone visit time during this encounter, and > 50% was spent counseling as documented under my assessment & plan.  Eilleen Kempf, MD 03/04/2019 10:55 AM  Disclaimer: This note was dictated with voice recognition software. Similar sounding words can inadvertently be transcribed and may not be corrected upon review.

## 2019-03-05 ENCOUNTER — Telehealth: Payer: Self-pay | Admitting: Internal Medicine

## 2019-03-05 NOTE — Telephone Encounter (Signed)
Letter mailed per 6/4 los. - appts . Central radiology to contact patient with ct scan

## 2019-04-05 ENCOUNTER — Other Ambulatory Visit: Payer: Self-pay | Admitting: *Deleted

## 2019-04-05 ENCOUNTER — Other Ambulatory Visit: Payer: Self-pay

## 2019-04-05 DIAGNOSIS — Z902 Acquired absence of lung [part of]: Secondary | ICD-10-CM

## 2019-04-06 ENCOUNTER — Ambulatory Visit (INDEPENDENT_AMBULATORY_CARE_PROVIDER_SITE_OTHER): Payer: Medicare Other | Admitting: Thoracic Surgery (Cardiothoracic Vascular Surgery)

## 2019-04-06 ENCOUNTER — Ambulatory Visit
Admission: RE | Admit: 2019-04-06 | Discharge: 2019-04-06 | Disposition: A | Discharge status: Home / Self Care | Payer: Medicare Other | Source: Ambulatory Visit | Attending: Thoracic Surgery (Cardiothoracic Vascular Surgery) | Admitting: Thoracic Surgery (Cardiothoracic Vascular Surgery)

## 2019-04-06 VITALS — BP 149/80 | HR 69 | Temp 97.7°F | Resp 20 | Ht 70.0 in | Wt 195.0 lb

## 2019-04-06 DIAGNOSIS — C3490 Malignant neoplasm of unspecified part of unspecified bronchus or lung: Secondary | ICD-10-CM

## 2019-04-06 DIAGNOSIS — J95812 Postprocedural air leak: Secondary | ICD-10-CM

## 2019-04-06 DIAGNOSIS — Z902 Acquired absence of lung [part of]: Secondary | ICD-10-CM

## 2019-04-06 DIAGNOSIS — R918 Other nonspecific abnormal finding of lung field: Secondary | ICD-10-CM | POA: Diagnosis not present

## 2019-04-06 NOTE — Progress Notes (Signed)
[address]       [place] [address]             [phone]     HPI: [name] returns for scheduled follow-up visit  Randall Sullivan is a 68 year old man with a history of tobacco abuse and COPD.  I did a left upper lobectomy for a stage Ia non-small cell carcinoma in 2017.  On 12/09/2018 I did right VATS for blebectomy and wedge resection of a right upper lobe nodule.  I was also a stage Ia non-small cell carcinoma.  He had a large air leak postoperatively and had IBV's placed.  Ultimately I had to go back into a redo VATS and there was a ruptured bleb in the superior segment of the lower lobe that was resected.  He had a prolonged air leak after that and went home with a chest tube.  He also was on oxygen.  I did an IBV removal on 02/10/2019.  Currently he is doing well.  He is not using oxygen at all during the day.  He still using it at night.  He wants to stop using it at night.  He still has some soreness along the right costal margin.  Overall his respiratory capacity is back to his baseline.  He had a CT in June.  It showed some soft tissue thickening around the staple lines.  There also was some mild right paratracheal adenopathy.  Past Medical History:  Diagnosis Date  . A-fib ([place]) 2017   after last lung surgery patient had a history if Afib documented in hospital   . Arthritis    back & legs ( knees)  . Cancer ([place])    lung  . Complication of anesthesia    agitated upon waking from anesth.   . Emphysema   . Emphysema of lung ([place])   . History of lung cancer   . Hypertension   . Oxygen dependent    2L continuous  . Tobacco abuse     Current Outpatient Medications  Medication Sig Dispense Refill  . acetaminophen (TYLENOL) 500 MG tablet Take 1,000 mg by mouth every 6 (six) hours as needed (for arthritis pain.).     [name] albuterol (PROVENTIL) (2.5 MG/3ML) 0.083% nebulizer solution Take 2.5 mg by nebulization every 6 (six) hours as needed for wheezing or  shortness of breath.   5  . aspirin EC 81 MG tablet Take 81 mg by mouth daily.    . cloNIDine (CATAPRES) 0.1 MG tablet Take 1 tablet (0.1 mg total) by mouth daily. 30 tablet 3  . diltiazem (CARDIZEM SR) 60 MG 12 hr capsule Take 1 capsule (60 mg total) by mouth every 12 (twelve) hours. 60 capsule 3  . fexofenadine (ALLEGRA) 180 MG tablet Take 180 mg by mouth daily as needed for allergies or rhinitis.    [name] guaiFENesin (MUCINEX) 600 MG 12 hr tablet Take 1 tablet (600 mg total) by mouth 2 (two) times daily as needed. 60 tablet 3  . meclizine (ANTIVERT) 25 MG tablet Take 1 tablet (25 mg total) by mouth 3 (three) times daily as needed for dizziness. May cause drowsiness 30 tablet 0  . metoprolol succinate (TOPROL-XL) 50 MG 24 hr tablet Take 50 mg by mouth daily. Take with or immediately following a meal.    . OXYGEN Inhale 2-3 L into the lungs continuous. Hx of lung ca, copd, and emphysema     No current facility-administered medications for  this visit.     Physical Exam BP (!) 149/80   Pulse 69   Temp 97.7 F (36.5 C) (Skin)   Resp 20   Ht 5\' 10"  (1.778 m)   Wt 195 lb (88.5 kg)   SpO2 92% Comment: RA  BMI 27.70 kg/m  68 year old man in no acute distress Alert and oriented x3 with no focal deficits Lungs diminished breath sounds bilaterally Incisions well-healed Cardiac regular rate and rhythm  Diagnostic Tests: CHEST - 2 VIEW  COMPARISON:  March 02, 2019  FINDINGS: Postsurgical changes are seen on the right. Opacity in the right base is likely scarring and/or atelectasis, stable. No pneumothorax. The left lung remains clear. The cardiomediastinal silhouette is stable.  IMPRESSION: Stable postsurgical changes. Opacity in the right base is likely scar and/or atelectasis. No acute interval changes.   Electronically Signed   By: Dorise Bullion III M.D   On: 04/06/2019 09:48 I personally reviewed the chest x-ray images and concur with the findings noted above  Impression:  Randall Sullivan is a 68 year old gentleman who has a history of a left upper lobectomy for a stage Ia non-small cell carcinoma in 2017.  He underwent right VATS for wedge resection for a second stage Ia non-small cell carcinoma in March 2020.  That was complicated by prolonged air leaks requiring IBV placement and redo VATS.  His air leak ultimately resolved.  His IBV's were removed about 2 months ago.  He still has some soreness.  He had some soreness for relatively long period of time after his previous operation.  I told him that is not out of the ordinary and should improve over time.  He is not having to take any medication for that.  CT in June showed some mild right paratracheal adenopathy.  This likely reactive adenopathy after all he went through.  He is scheduled to have a follow-up CT and see Dr. Julien Nordmann in September.  Plan: Follow-up as scheduled with Dr. Julien Nordmann I will be happy to see Mr. Ostrum back at anytime in the future if I can be of any further assistance with his care  [name], MD [facility] [phone]

## 2019-05-03 ENCOUNTER — Other Ambulatory Visit: Payer: Self-pay | Admitting: *Deleted

## 2019-05-03 DIAGNOSIS — I10 Essential (primary) hypertension: Secondary | ICD-10-CM

## 2019-05-03 MED ORDER — CLONIDINE HCL 0.1 MG PO TABS: 0.1000 mg | ORAL_TABLET | Freq: Every day | ORAL | 3 refills | Status: DC

## 2019-06-01 ENCOUNTER — Other Ambulatory Visit: Payer: Medicare Other

## 2019-06-02 ENCOUNTER — Inpatient Hospital Stay: Payer: Medicare Other | Attending: Internal Medicine

## 2019-06-02 ENCOUNTER — Ambulatory Visit (HOSPITAL_COMMUNITY)
Admission: RE | Admit: 2019-06-02 | Discharge: 2019-06-02 | Disposition: A | Discharge status: Home / Self Care | Payer: Medicare Other | Source: Ambulatory Visit | Attending: Internal Medicine | Admitting: Internal Medicine

## 2019-06-02 ENCOUNTER — Other Ambulatory Visit: Payer: Self-pay

## 2019-06-02 ENCOUNTER — Telehealth: Payer: Self-pay | Admitting: Internal Medicine

## 2019-06-02 DIAGNOSIS — C3411 Malignant neoplasm of upper lobe, right bronchus or lung: Secondary | ICD-10-CM | POA: Diagnosis not present

## 2019-06-02 DIAGNOSIS — C3490 Malignant neoplasm of unspecified part of unspecified bronchus or lung: Secondary | ICD-10-CM

## 2019-06-02 LAB — CBC WITH DIFFERENTIAL (CANCER CENTER ONLY)
Abs Immature Granulocytes: 0.03 10*3/uL (ref 0.00–0.07)
Basophils Absolute: 0.1 10*3/uL (ref 0.0–0.1)
Basophils Relative: 1 %
Eosinophils Absolute: 0.3 10*3/uL (ref 0.0–0.5)
Eosinophils Relative: 3 %
HCT: 45.5 % (ref 39.0–52.0)
Hemoglobin: 14.6 g/dL (ref 13.0–17.0)
Immature Granulocytes: 0 %
Lymphocytes Relative: 19 %
Lymphs Abs: 1.6 10*3/uL (ref 0.7–4.0)
MCH: 27.1 pg (ref 26.0–34.0)
MCHC: 32.1 g/dL (ref 30.0–36.0)
MCV: 84.6 fL (ref 80.0–100.0)
Monocytes Absolute: 0.8 10*3/uL (ref 0.1–1.0)
Monocytes Relative: 10 %
Neutro Abs: 5.5 10*3/uL (ref 1.7–7.7)
Neutrophils Relative %: 67 %
Platelet Count: 253 10*3/uL (ref 150–400)
RBC: 5.38 MIL/uL (ref 4.22–5.81)
RDW: 14.7 % (ref 11.5–15.5)
WBC Count: 8.3 10*3/uL (ref 4.0–10.5)
nRBC: 0 % (ref 0.0–0.2)

## 2019-06-02 LAB — CMP (CANCER CENTER ONLY)
ALT: 19 U/L (ref 0–44)
AST: 19 U/L (ref 15–41)
Albumin: 4.2 g/dL (ref 3.5–5.0)
Alkaline Phosphatase: 52 U/L (ref 38–126)
Anion gap: 11 (ref 5–15)
BUN: 13 mg/dL (ref 8–23)
CO2: 28 mmol/L (ref 22–32)
Calcium: 9.4 mg/dL (ref 8.9–10.3)
Chloride: 102 mmol/L (ref 98–111)
Creatinine: 1.02 mg/dL (ref 0.61–1.24)
GFR, Est AFR Am: >60 mL/min (ref >60)
GFR, Estimated: >60 mL/min (ref >60)
Glucose, Bld: 102 mg/dL — ABNORMAL HIGH (ref 70–99)
Potassium: 4.9 mmol/L (ref 3.5–5.1)
Sodium: 141 mmol/L (ref 135–145)
Total Bilirubin: 0.5 mg/dL (ref 0.3–1.2)
Total Protein: 7.8 g/dL (ref 6.5–8.1)

## 2019-06-02 MED ORDER — IOHEXOL 300 MG/ML  SOLN
75.0000 mL | Freq: Once | INTRAMUSCULAR | Status: AC | PRN
  Administered 2019-06-02: 12:00:00 75 mL via INTRAVENOUS

## 2019-06-02 MED ORDER — SODIUM CHLORIDE (PF) 0.9 % IJ SOLN
INTRAMUSCULAR | Status: AC
  Filled 2019-06-02: qty 50

## 2019-06-02 NOTE — Telephone Encounter (Signed)
Called patient regarding upcoming Webex appointment, spoke with patient's wife and she informed me they don't access to do Webex due to not having Wifi. This will be a phone visit.  Message to provider.

## 2019-06-02 NOTE — Telephone Encounter (Signed)
ok 

## 2019-06-03 ENCOUNTER — Encounter: Payer: Self-pay | Admitting: Internal Medicine

## 2019-06-03 ENCOUNTER — Inpatient Hospital Stay (HOSPITAL_BASED_OUTPATIENT_CLINIC_OR_DEPARTMENT_OTHER): Payer: Medicare Other | Admitting: Internal Medicine

## 2019-06-03 DIAGNOSIS — C3490 Malignant neoplasm of unspecified part of unspecified bronchus or lung: Secondary | ICD-10-CM | POA: Diagnosis not present

## 2019-06-03 IMAGING — CR CHEST - 2 VIEW
2 series · 2 of 2 positions shown · non-contrast
Comparison: 01/12/2019

CLINICAL DATA: History of lung cancer status post VATS.

EXAM:
CHEST - 2 VIEW

[w chest pa]
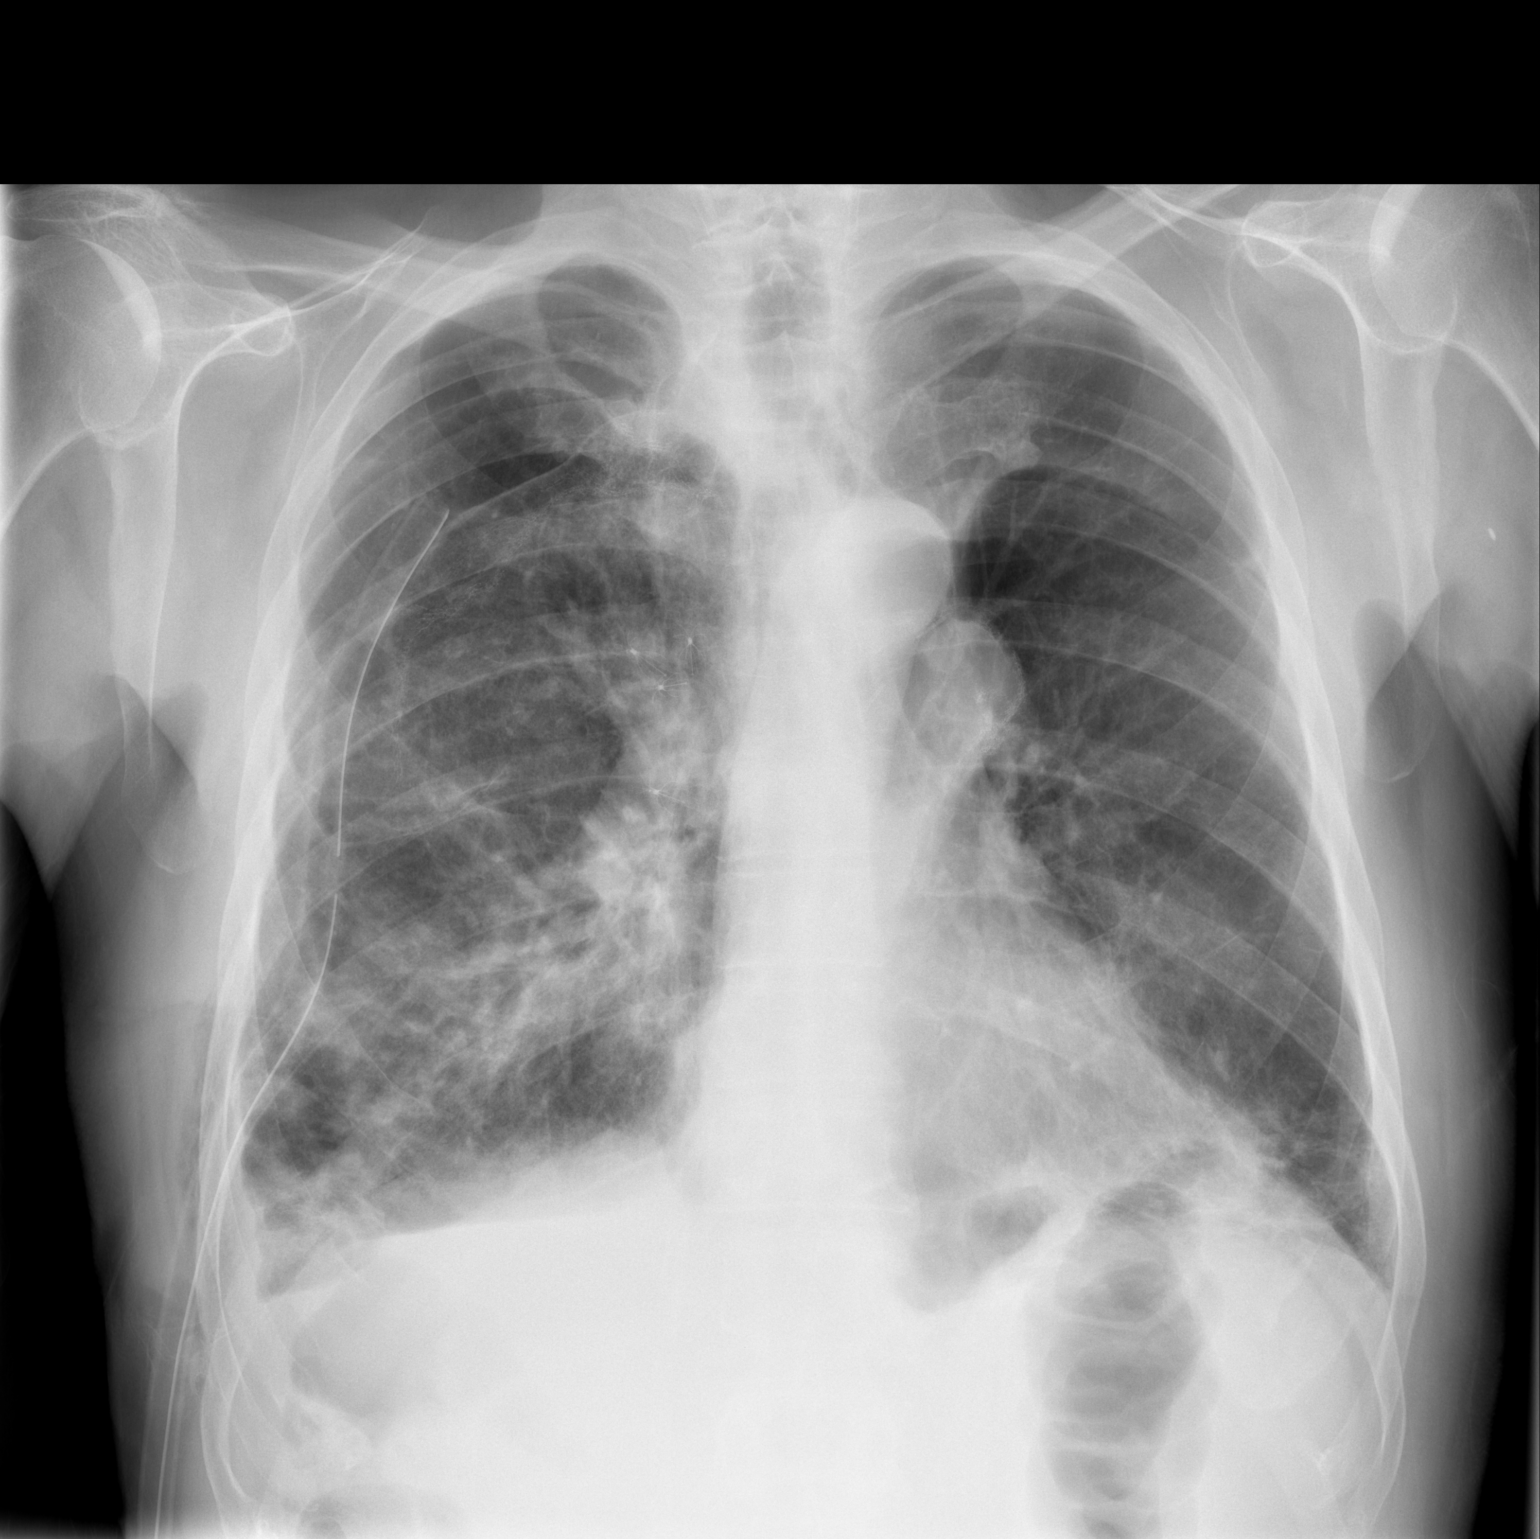

[w chest lat]
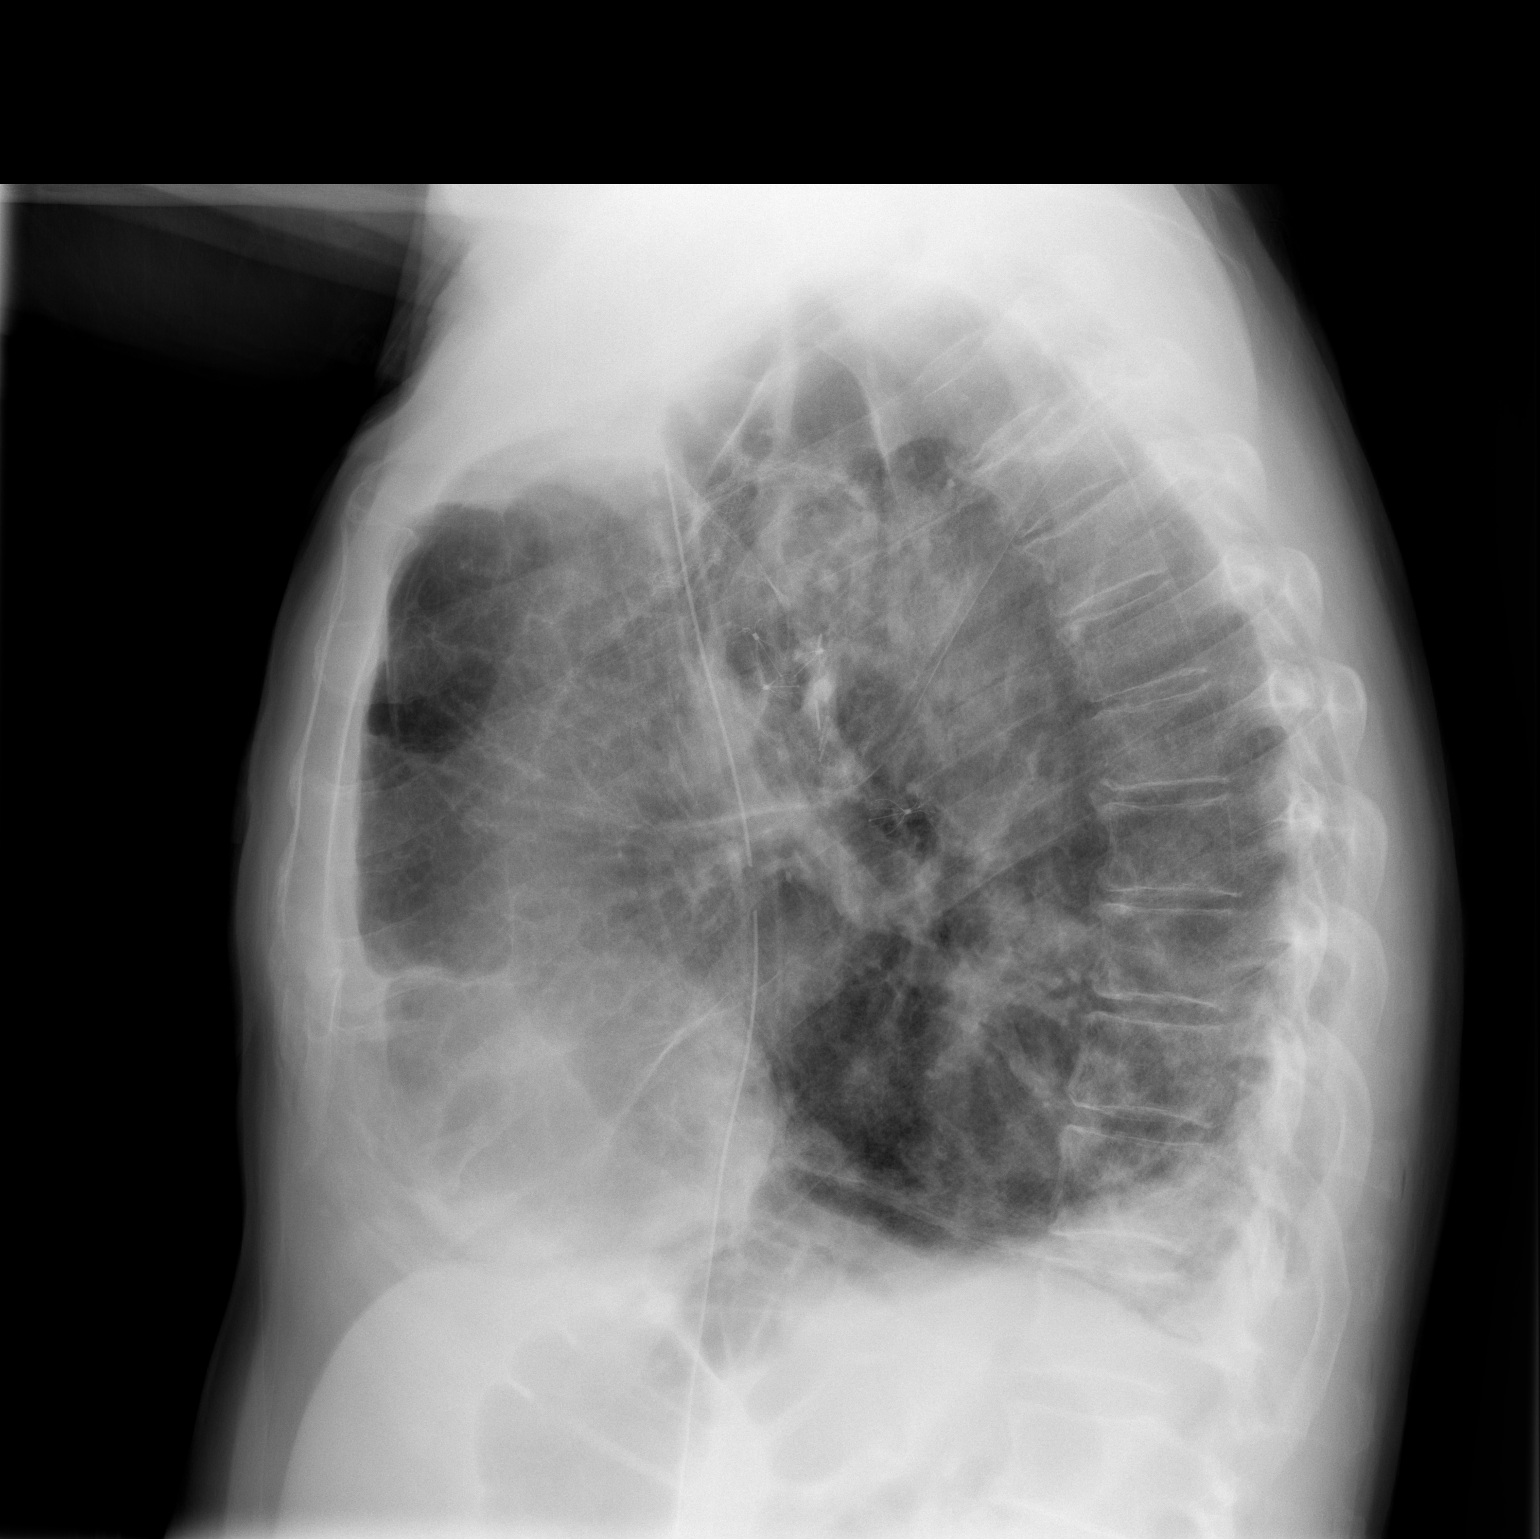

[2 of 2 positions shown; findings below may reference images not displayed]

FINDINGS: A right chest tube is unchanged. Postoperative changes are again
seen in the right lung with bronchial valves in the right hilum. The
cardiomediastinal silhouette is unchanged with normal heart size. A
small right pleural effusion is unchanged. A small right apical
pneumothorax is questioned. Coarse right greater than left lower
lung opacities are unchanged. Soft tissue emphysema in the right
lateral chest wall has decreased. No acute osseous abnormality is
seen.
IMPRESSION: 1. Unchanged positioning of right chest tube with possible small
right apical pneumothorax.
2. Decreased soft tissue emphysema in the right chest wall.
3. Unchanged small right pleural effusion and right greater than
left basilar lung opacities.

## 2019-06-03 NOTE — Progress Notes (Signed)
[facility] Telephone:([phone]) [phone]   Fax:(336) [phone]  PROGRESS NOTE FOR TELEMEDICINE VISITS  Randall Sullivan, [place] [address]  I connected with@ on 06/03/19 at 11:15 AM EDT by telephone visit and verified that I am speaking with the correct person using two identifiers.   I discussed the limitations, risks, security and privacy concerns of performing an evaluation and management service by telemedicine and the availability of in-person appointments. I also discussed with the patient that there may be a patient responsible charge related to this service. The patient expressed understanding and agreed to proceed.  Other persons participating in the visit and their role in the encounter: None  Patient's location: Home Provider's location: [place]  DIAGNOSIS: 1) Stage IA (T1b, N0, M0) non-small cell lung cancer, well-differentiated adenocarcinoma presented with left upper lobeS pulmonary nodule diagnosed in September 2017. 2) stage Ia non-small cell lung cancer diagnosed in March 2020.  PRIOR THERAPY:  1) Status post left VATS, left upper lobectomy with mediastinal lymph node dissection under the care of Dr. Roxan Hockey on 08/01/2016. 2) status post right VATS with wedge resection of right upper lobe nodule and bleb resection under the care of Dr. Roxan Hockey on December 10, 2018.   3) Status post bronchoscopy with endobronchial valve replacement in the anterior apical and posterior segments of right upper lobe and superior segment of right lower lobe  CURRENT THERAPY: Observation.  INTERVAL HISTORY: Randall Sullivan 68 y.o. male has a telephone virtual visit with me today for evaluation and discussion of his scan results.  The patient is feeling fine today with no concerning complaints.  He denied having any chest pain, shortness of breath, cough or hemoptysis.  He denied having any fever or chills.  He has no nausea,  vomiting, diarrhea or constipation.  He denied having any headache or visual changes.  He has no change in his medications or allergy list.  He had repeat CT scan of the chest performed recently and he is having the visit for evaluation and discussion of the scan results.  MEDICAL HISTORY: Past Medical History:  Diagnosis Date  . A-fib ([place]) 2017   after last lung surgery patient had a history if Afib documented in hospital   . Arthritis    back & legs ( knees)  . Cancer ([place])    lung  . Complication of anesthesia    agitated upon waking from anesth.   . Emphysema   . Emphysema of lung ([place])   . History of lung cancer   . Hypertension   . Oxygen dependent    2L continuous  . Tobacco abuse     ALLERGIES:  is allergic to no known allergies.  MEDICATIONS:  Current Outpatient Medications  Medication Sig Dispense Refill  . acetaminophen (TYLENOL) 500 MG tablet Take 1,000 mg by mouth every 6 (six) hours as needed (for arthritis pain.).     [name] albuterol (PROVENTIL) (2.5 MG/3ML) 0.083% nebulizer solution Take 2.5 mg by nebulization every 6 (six) hours as needed for wheezing or shortness of breath.   5  . aspirin EC 81 MG tablet Take 81 mg by mouth daily.    . cloNIDine (CATAPRES) 0.1 MG tablet Take 1 tablet (0.1 mg total) by mouth daily. 30 tablet 3  . diltiazem (CARDIZEM SR) 60 MG 12 hr capsule Take 1 capsule (60 mg total) by mouth every 12 (twelve) hours. 60 capsule 3  . fexofenadine (ALLEGRA) 180 MG tablet Take 180  mg by mouth daily as needed for allergies or rhinitis.    [name] guaiFENesin (MUCINEX) 600 MG 12 hr tablet Take 1 tablet (600 mg total) by mouth 2 (two) times daily as needed. 60 tablet 3  . meclizine (ANTIVERT) 25 MG tablet Take 1 tablet (25 mg total) by mouth 3 (three) times daily as needed for dizziness. May cause drowsiness 30 tablet 0  . metoprolol succinate (TOPROL-XL) 50 MG 24 hr tablet Take 50 mg by mouth daily. Take with or immediately following a meal.    . OXYGEN Inhale  2-3 L into the lungs continuous. Hx of lung ca, copd, and emphysema     No current facility-administered medications for this visit.     SURGICAL HISTORY:  Past Surgical History:  Procedure Laterality Date  . HERNIA REPAIR Bilateral 6269   umbilical  . VIDEO ASSISTED THORACOSCOPY Right 12/21/2018   Procedure: VIDEO ASSISTED THORACOSCOPY AND RESECTION OF BLEBS RIGHT LOWER LOBE;  Surgeon: [name], MD;  Location: [facility];  Service: Thoracic;  Laterality: Right;  [name] VIDEO ASSISTED THORACOSCOPY (VATS)/ LOBECTOMY Left 08/01/2016   Procedure: VIDEO ASSISTED THORACOSCOPY (VATS)/ LEFT UPPER LOBECTOMY with lymph node sampling and onQ placement;  Surgeon: [name], MD;  Location: [facility];  Service: Thoracic;  Laterality: Left;  [name] VIDEO ASSISTED THORACOSCOPY (VATS)/WEDGE RESECTION Right 12/09/2018   Procedure: VIDEO ASSISTED THORACOSCOPY (VATS)/WEDGE RESECTION;  Surgeon: [name], MD;  Location: [facility];  Service: Thoracic;  Laterality: Right;  [name] VIDEO BRONCHOSCOPY Bilateral 12/21/2018   Procedure: VIDEO BRONCHOSCOPY;  Surgeon: [name], MD;  Location: [facility];  Service: Thoracic;  Laterality: Bilateral;  . VIDEO BRONCHOSCOPY WITH ENDOBRONCHIAL NAVIGATION N/A 07/03/2016   Procedure: VIDEO BRONCHOSCOPY WITH ENDOBRONCHIAL NAVIGATION;  Surgeon: [name], MD;  Location: [facility];  Service: Thoracic;  Laterality: N/A;  . VIDEO BRONCHOSCOPY WITH INSERTION OF INTERBRONCHIAL VALVE (IBV) N/A 12/11/2018   Procedure: VIDEO BRONCHOSCOPY WITH INSERTION OF INTERBRONCHIAL VALVE (IBV);  Surgeon: [name], MD;  Location: [facility] OR;  Service: Thoracic;  Laterality: N/A;  . VIDEO BRONCHOSCOPY WITH INSERTION OF INTERBRONCHIAL VALVE (IBV) N/A 02/10/2019   Procedure: VIDEO BRONCHOSCOPY WITH REMOVAL OF INTERBRONCHIAL VALVE (IBV);  Surgeon: [name], MD;  Location: [facility] OR;  Service: Thoracic;  Laterality: N/A;    REVIEW OF SYSTEMS:  A comprehensive review of  systems was negative.    LABORATORY DATA: Lab Results  Component Value Date   WBC 8.3 06/02/2019   HGB 14.6 06/02/2019   HCT 45.5 06/02/2019   MCV 84.6 06/02/2019   PLT 253 06/02/2019      Chemistry      Component Value Date/Time   NA 141 06/02/2019 1004   NA 136 04/09/2017 1249   K 4.9 06/02/2019 1004   K 4.6 04/09/2017 1249   CL 102 06/02/2019 1004   CO2 28 06/02/2019 1004   CO2 27 04/09/2017 1249   BUN 13 06/02/2019 1004   BUN 23.9 04/09/2017 1249   CREATININE 1.02 06/02/2019 1004   CREATININE 1.4 (H) 04/09/2017 1249      Component Value Date/Time   CALCIUM 9.4 06/02/2019 1004   CALCIUM 9.6 04/09/2017 1249   ALKPHOS 52 06/02/2019 1004   ALKPHOS 58 04/09/2017 1249   AST 19 06/02/2019 1004   AST 13 04/09/2017 1249   ALT 19 06/02/2019 1004   ALT 9 04/09/2017 1249   BILITOT 0.5 06/02/2019 1004   BILITOT 1.01 04/09/2017 1249       RADIOGRAPHIC  STUDIES: Ct Chest W Contrast  Result Date: 06/02/2019 CLINICAL DATA:  Lung cancer. EXAM: CT CHEST WITH CONTRAST TECHNIQUE: Multidetector CT imaging of the chest was performed during intravenous contrast administration. CONTRAST:  79mL OMNIPAQUE IOHEXOL 300 MG/ML  SOLN COMPARISON:  03/02/2019. FINDINGS: Cardiovascular: Atherosclerotic calcification of the aorta and coronary arteries. Heart size normal. No pericardial effusion. Mediastinum/Nodes: High right paratracheal lymph node has decreased slightly in size, now measuring 9 mm (2/31), previously 1.4 cm. No hilar or axillary adenopathy. Esophagus is grossly unremarkable. Lungs/Pleura: Centrilobular emphysema. Post treatment parenchymal retraction and volume loss in the upper right hemithorax. Previously seen ground-glass in the right lower lobe has resolved in the interval. There is a residual spiculated right lower lobe nodule measuring 8 x 9 mm (7/105), previously 9 x 12 mm. Left upper lobectomy. No pleural fluid. Airway is otherwise unremarkable. Upper Abdomen: Visualized portion  of the liver is decreased in attenuation diffusely. Visualized portions of the liver, adrenal glands, kidneys, spleen, pancreas, stomach and bowel are otherwise unremarkable. No upper abdominal adenopathy. Musculoskeletal: Degenerative changes in the spine. No worrisome lytic or sclerotic lesions. IMPRESSION: 1. Spiculated right lower lobe nodule has decreased slightly in size in the interval. Previously seen surrounding ground-glass in the right lower lobe has resolved. 2. High right paratracheal lymph node has decreased in size slightly in the interval. 3. Hepatic steatosis. 4. Aortic atherosclerosis (ICD10-170.0). Coronary artery calcification. 5.  Emphysema (ICD10-J43.9). Electronically Signed   By: Lorin Picket M.D.   On: 06/02/2019 15:01    ASSESSMENT AND PLAN: This is a very pleasant 68 years old white male with a stage IA non-small cell lung cancer status post left lower lobectomy with lymph node dissection under the care of Dr. Roxan Hockey in November 2017 and has been observation since that time. He was also diagnosed with a stage Ia non-small cell lung cancer in March 2020 and the patient underwent wedge resection of the upper lobe in March 2020. The patient is currently on observation and he is feeling fine with no concerning complaints. He had repeat CT scan of the chest performed recently.  I personally and independently reviewed the scans and discussed the result with the patient today. His scan showed no concerning findings for disease progression. I recommended for the patient to continue on observation with repeat CT scan of the chest in 6 months. He was advised to call immediately if he has any concerning symptoms in the interval. I discussed the assessment and treatment plan with the patient. The patient was provided an opportunity to ask questions and all were answered. The patient agreed with the plan and demonstrated an understanding of the instructions.   The patient was  advised to call back or seek an in-person evaluation if the symptoms worsen or if the condition fails to improve as anticipated.  I provided 12 minutes of non face-to-face telephone visit time during this encounter, and > 50% was spent counseling as documented under my assessment & plan.  Eilleen Kempf, MD 06/03/2019 10:57 AM  Disclaimer: This note was dictated with voice recognition software. Similar sounding words can inadvertently be transcribed and may not be corrected upon review.

## 2019-06-04 ENCOUNTER — Telehealth: Payer: Self-pay | Admitting: Internal Medicine

## 2019-06-04 NOTE — Telephone Encounter (Signed)
I left a message regarding schedule  

## 2019-06-10 IMAGING — CR CHEST - 2 VIEW
2 series · 2 of 2 positions shown · non-contrast
Comparison: 01/19/2019

CLINICAL DATA: Recent VATS, cough and congestion

EXAM:
CHEST - 2 VIEW

[w chest pa]
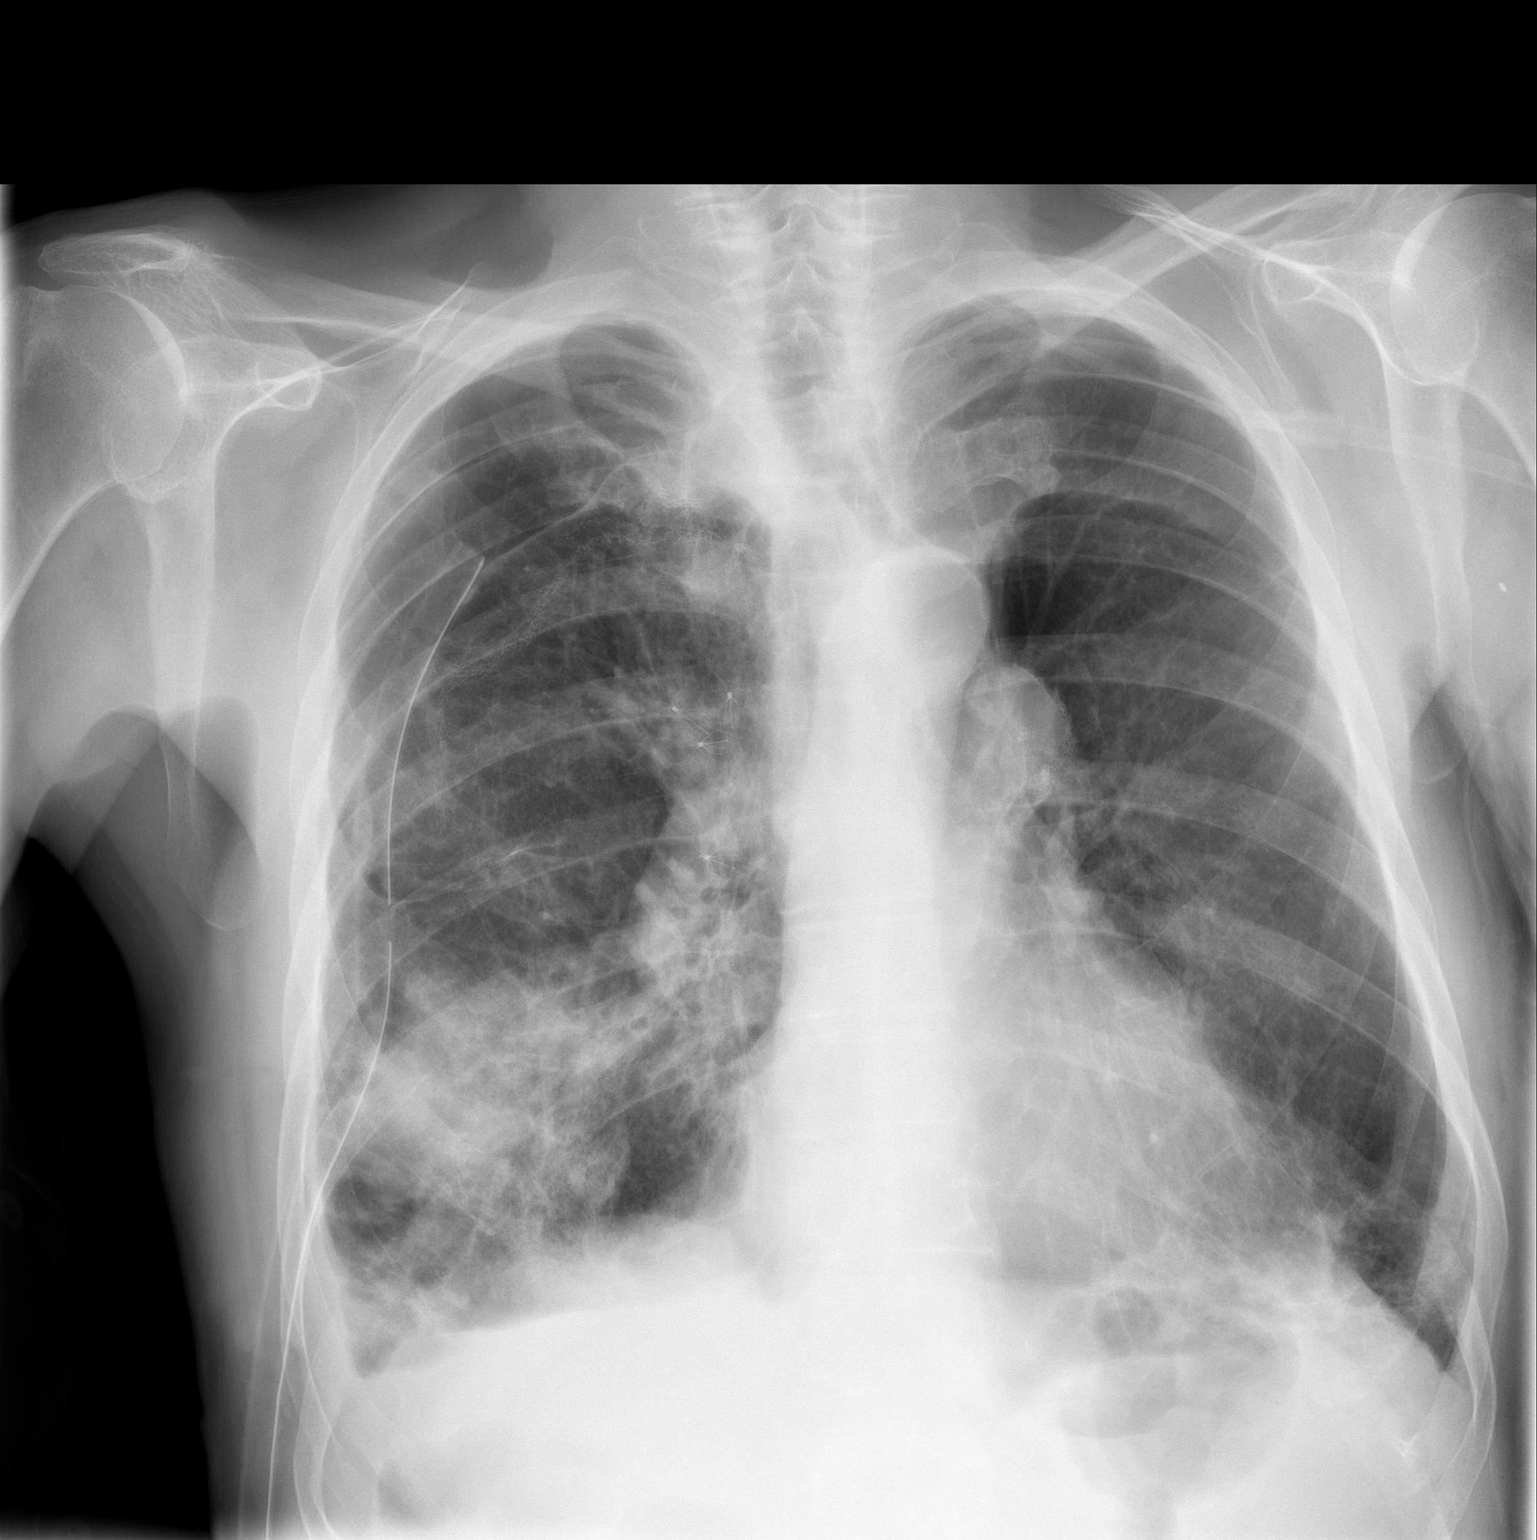

[w chest lat]
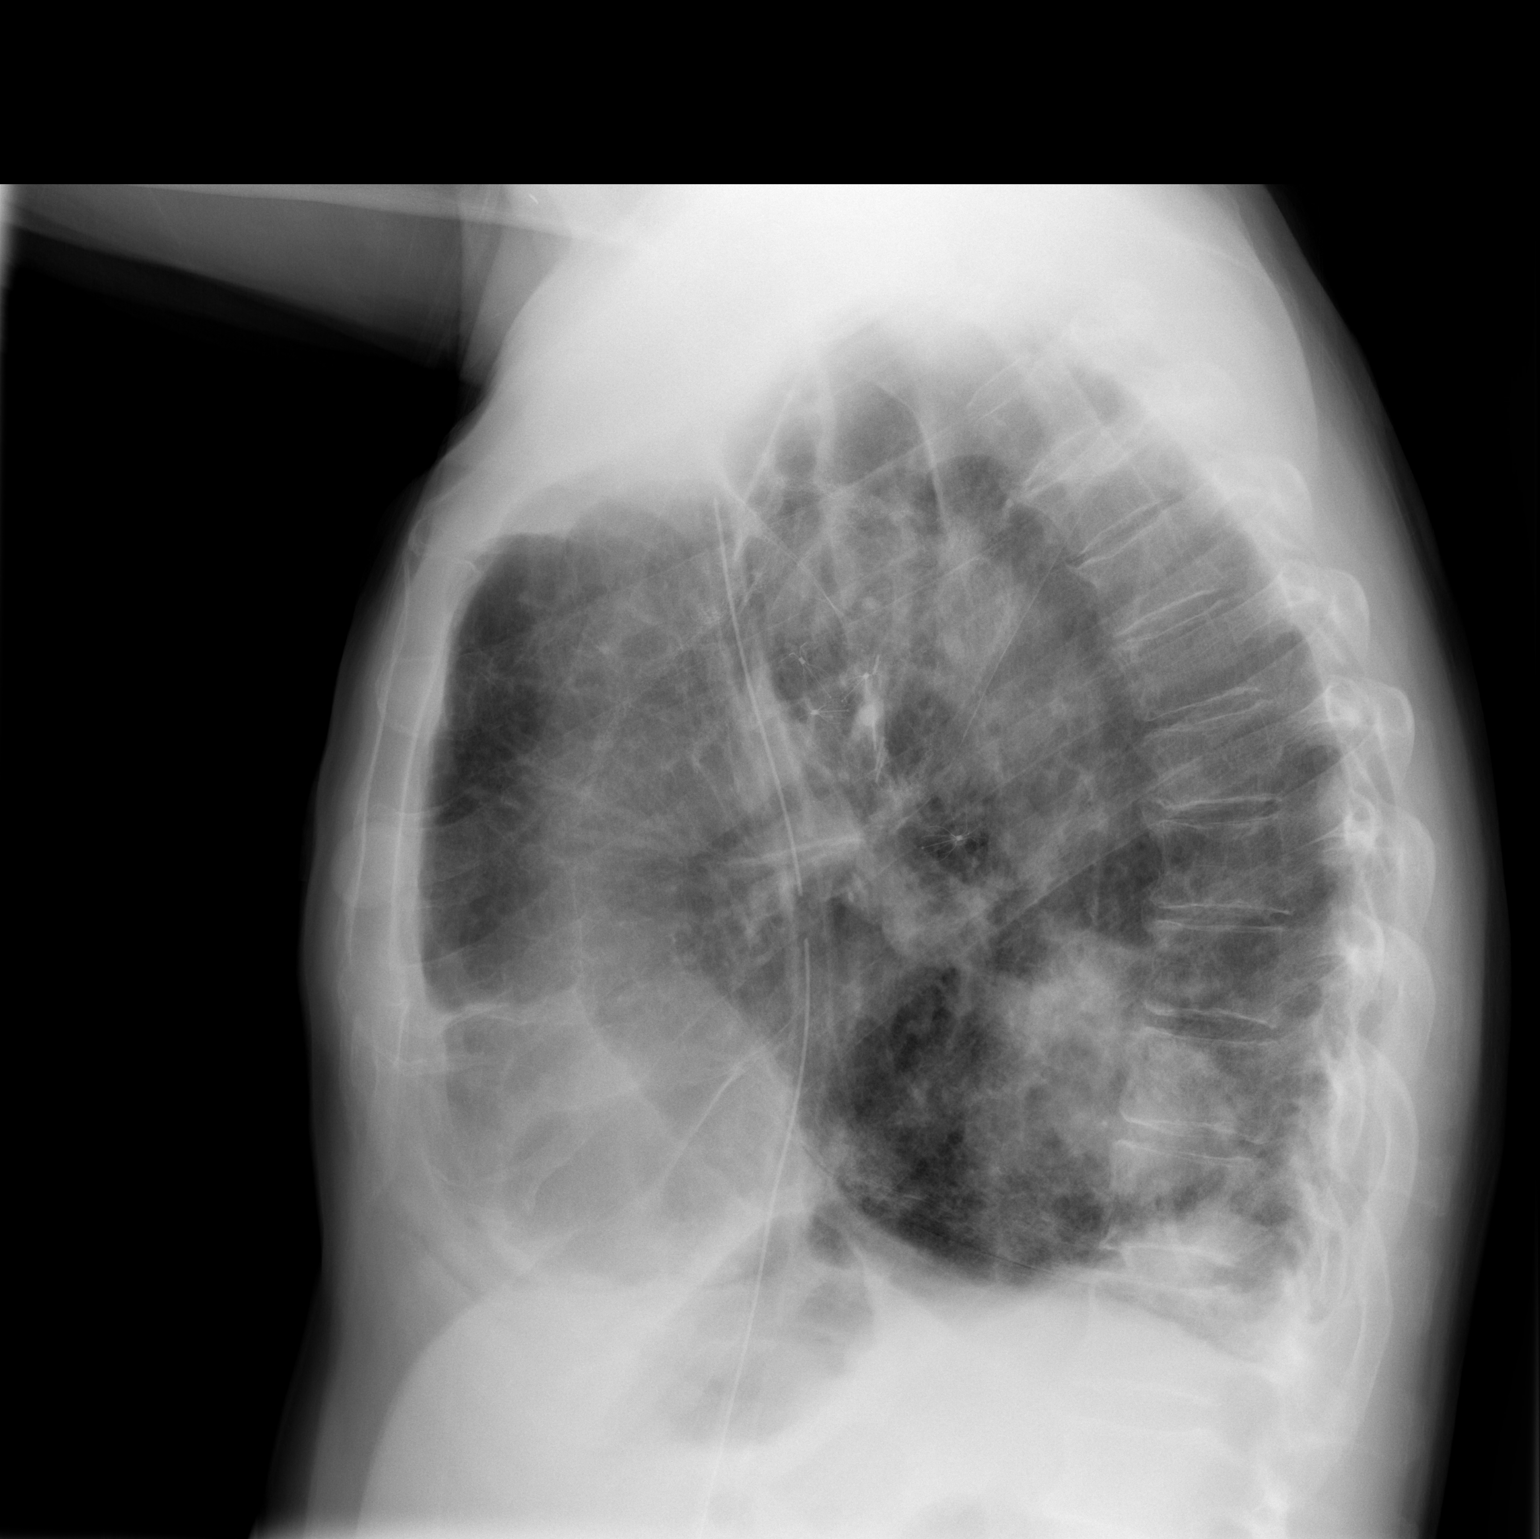

[2 of 2 positions shown; findings below may reference images not displayed]

FINDINGS: Right-sided chest tube. No pneumothorax. Rounded right lower lobe
airspace opacity which may reflect pulmonary hemorrhage and/or
pneumonia. Small right pleural effusion. Postsurgical changes in the
right upper lobe. Mild left basilar airspace disease. Stable
cardiomediastinal silhouette. No aggressive osseous lesion.
IMPRESSION: 1. Right-sided chest tube in unchanged position.  No pneumothorax.
2. Trace right pleural effusion with more focal right lower lobe
airspace disease. Mild residual left basilar airspace disease.

## 2019-06-11 IMAGING — CR CHEST - 2 VIEW SAME DAY
2 series · 2 of 2 positions shown · non-contrast
Comparison: Earlier same day

CLINICAL DATA: Follow-up chest tube removal.

EXAM:
CHEST - 2 VIEW SAME DAY

[w chest pa]
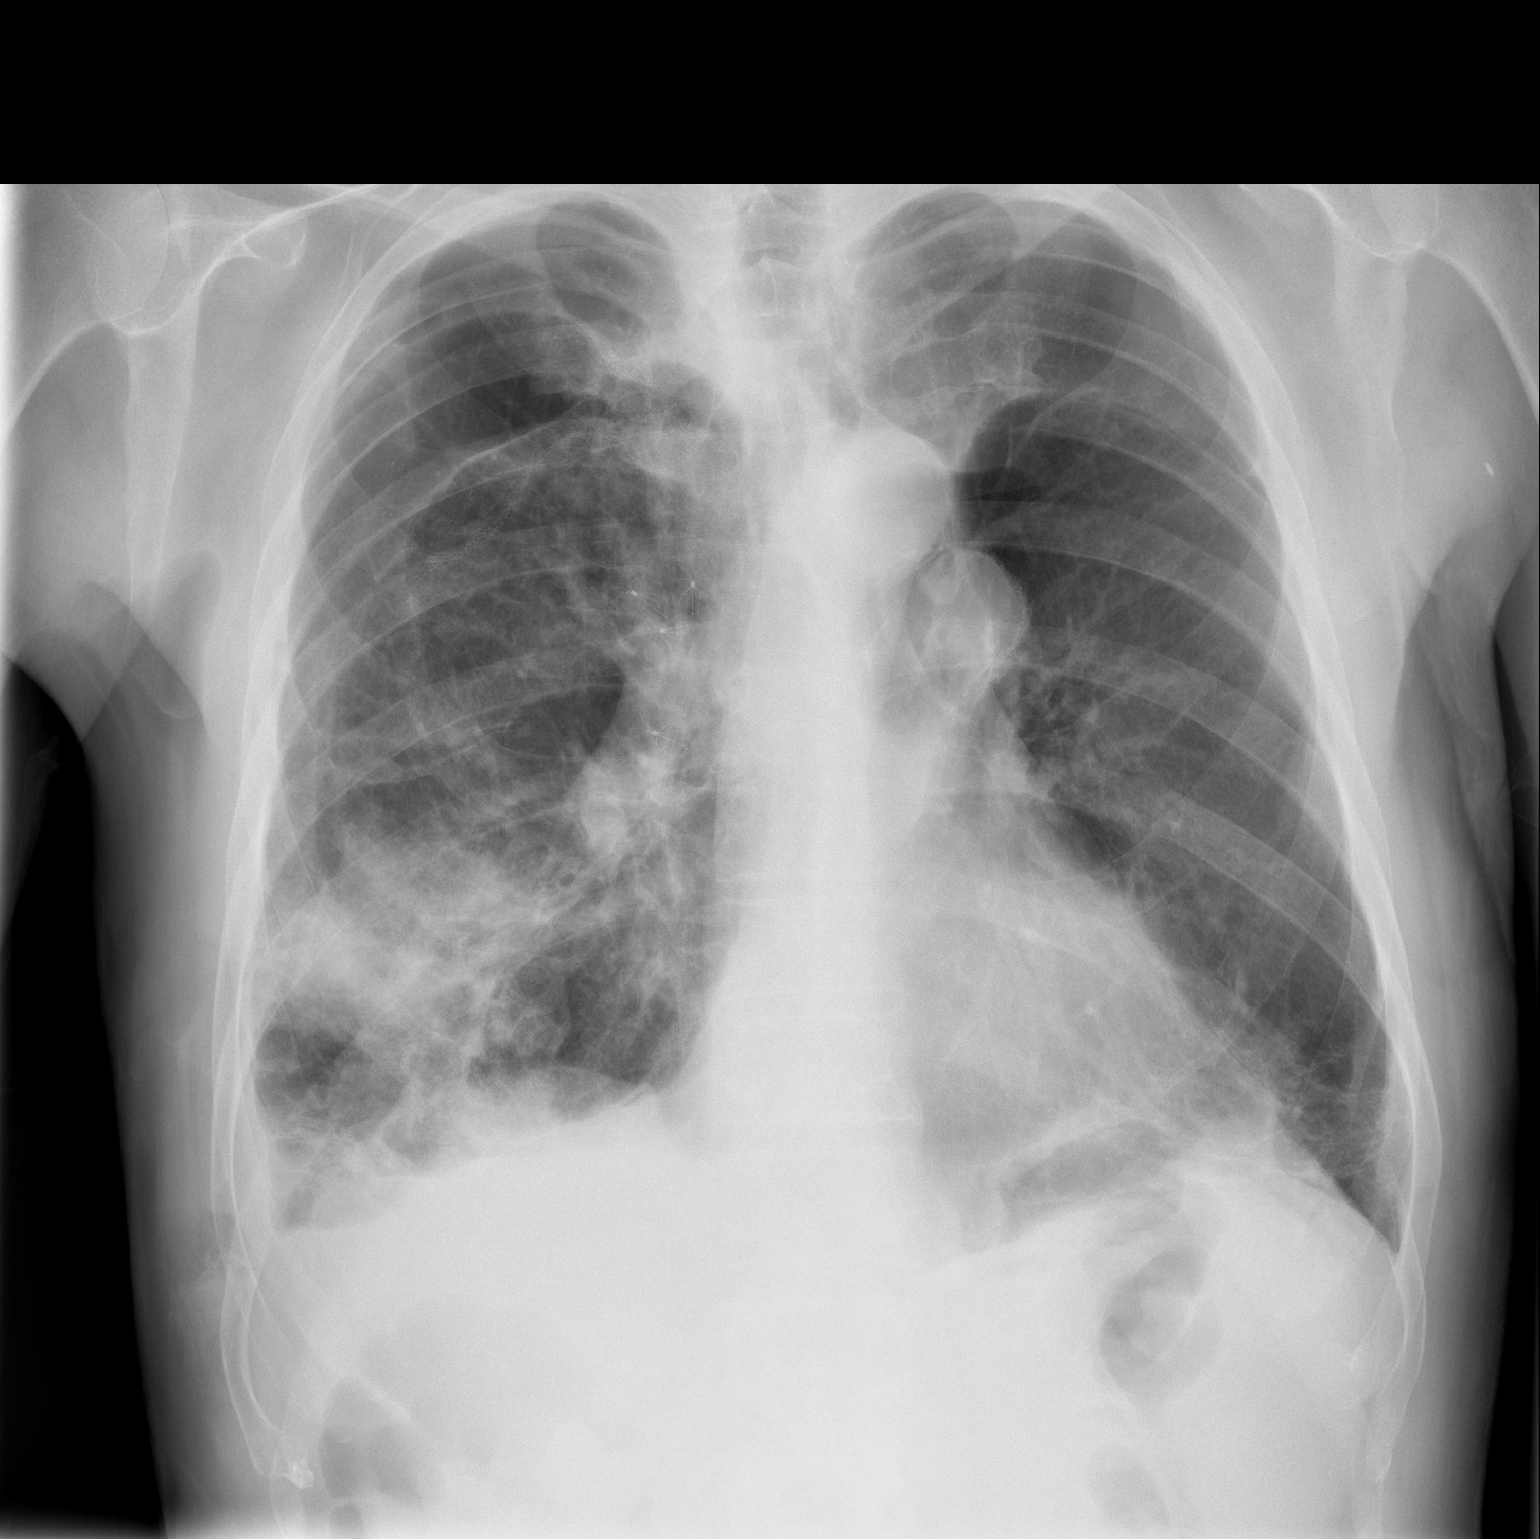

[w chest lat]
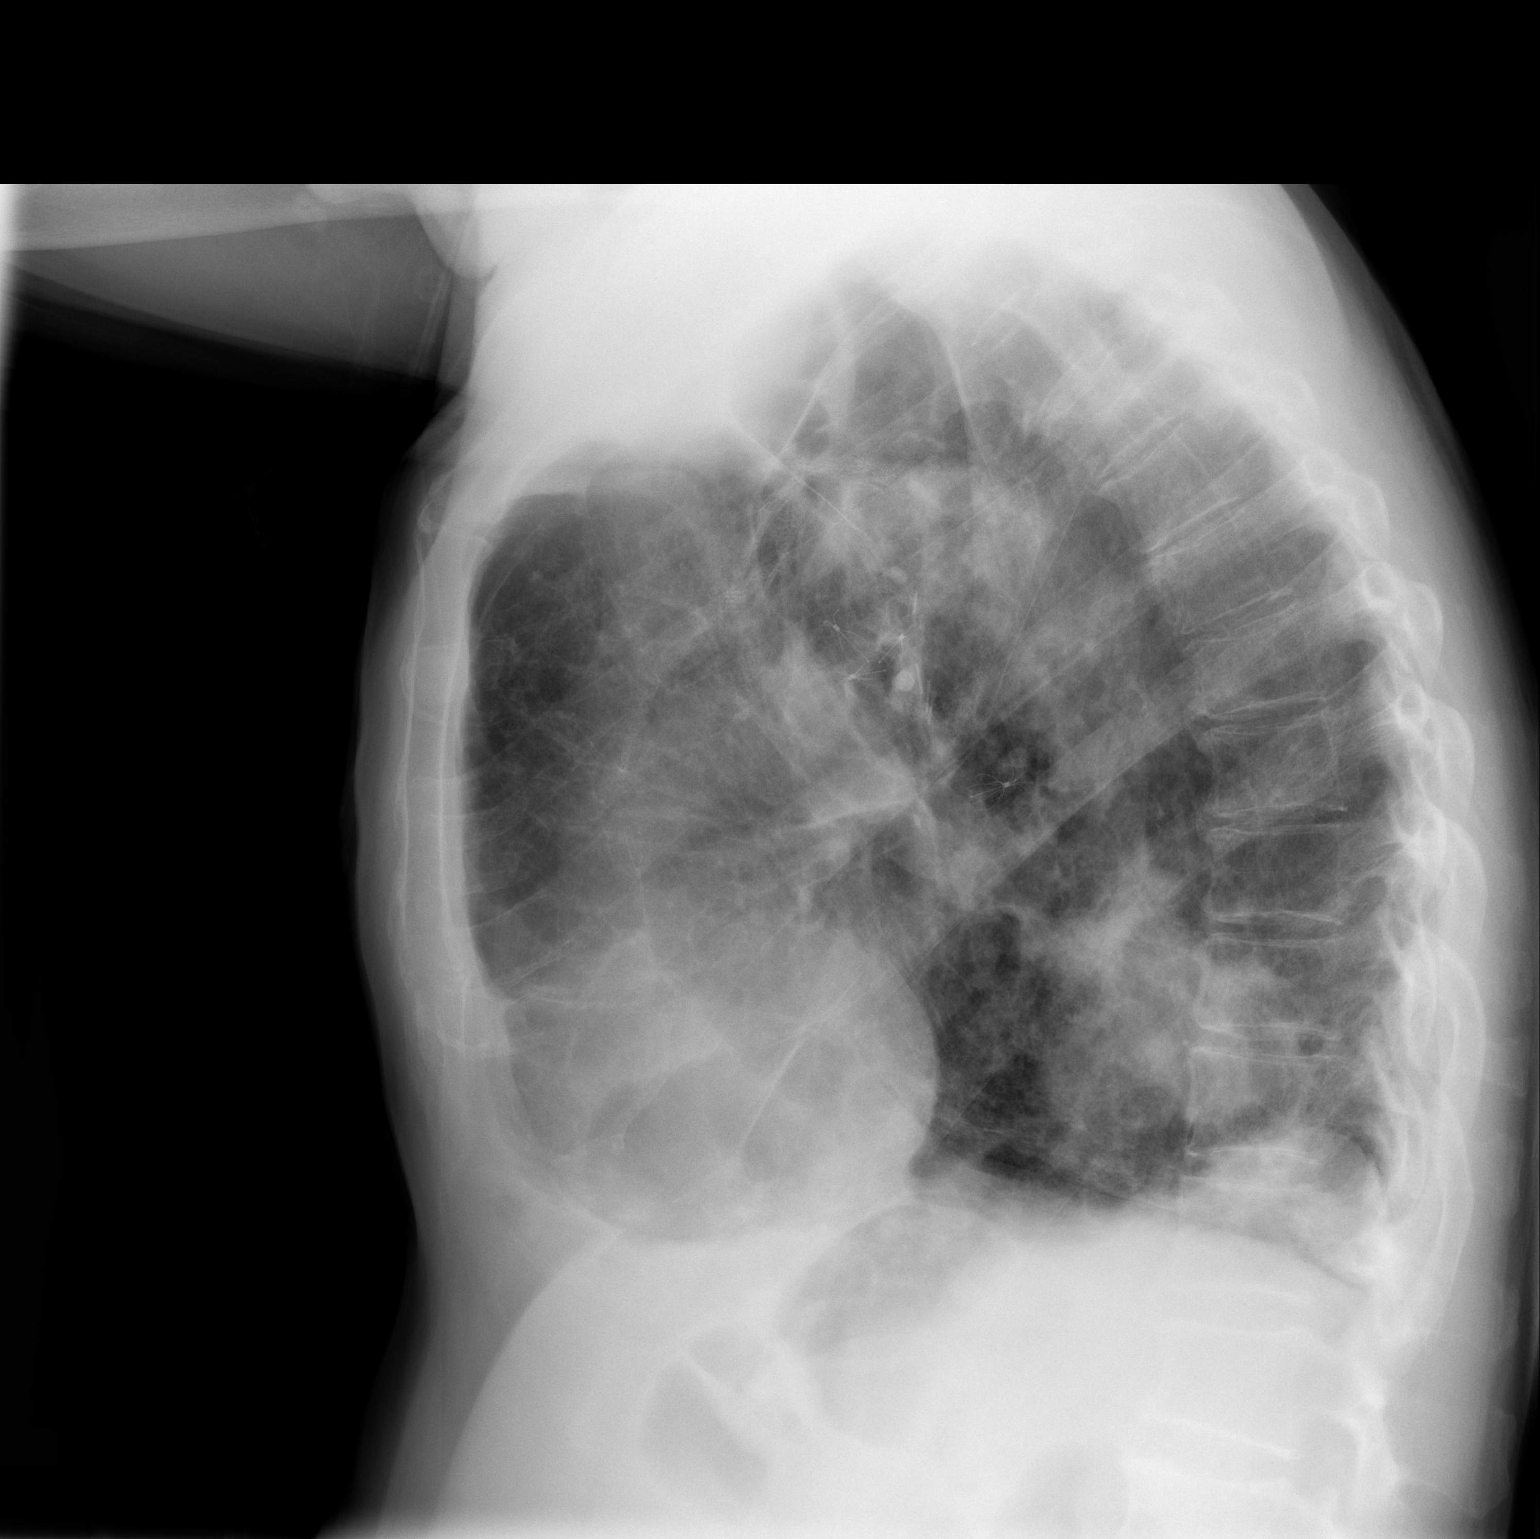

[2 of 2 positions shown; findings below may reference images not displayed]

FINDINGS: Right chest tube has been removed. Chest tube track is visible.
Bullous disease in the right lung is present, without plain
radiographic evidence of a definite pneumothorax. Mass density
persists in the right lower lung. Left lung shows a stable pattern
scarring and emphysema. Multiple bronchial valves are in place on
the right present on the right.
IMPRESSION: No evidence of pneumothorax following chest tube removal on the
right. No other change.

## 2019-06-16 ENCOUNTER — Other Ambulatory Visit: Payer: Self-pay | Admitting: *Deleted

## 2019-06-16 NOTE — Telephone Encounter (Signed)
This need to be refilled by his primary care physician or a pulmonary medicine.  Thank you.

## 2019-06-16 NOTE — Telephone Encounter (Signed)
Wife called to see if Dr Julien Nordmann will refill Randall Sullivan's albuterol inhaler. Was originally prescribed by Dr Roxan Hockey, but he is no longer seeing Dr Roxan Hockey.

## 2019-06-16 NOTE — Telephone Encounter (Signed)
LM with note below 

## 2019-09-02 DIAGNOSIS — Z85118 Personal history of other malignant neoplasm of bronchus and lung: Secondary | ICD-10-CM | POA: Diagnosis not present

## 2019-09-02 DIAGNOSIS — I10 Essential (primary) hypertension: Secondary | ICD-10-CM | POA: Diagnosis not present

## 2019-09-02 DIAGNOSIS — Z0000 Encounter for general adult medical examination without abnormal findings: Secondary | ICD-10-CM | POA: Diagnosis not present

## 2019-09-02 DIAGNOSIS — J449 Chronic obstructive pulmonary disease, unspecified: Secondary | ICD-10-CM | POA: Diagnosis not present

## 2019-09-02 DIAGNOSIS — Z131 Encounter for screening for diabetes mellitus: Secondary | ICD-10-CM | POA: Diagnosis not present

## 2019-09-02 DIAGNOSIS — E785 Hyperlipidemia, unspecified: Secondary | ICD-10-CM | POA: Diagnosis not present

## 2019-09-08 ENCOUNTER — Encounter: Payer: Self-pay | Admitting: *Deleted

## 2019-11-17 ENCOUNTER — Other Ambulatory Visit: Payer: Self-pay

## 2019-11-17 ENCOUNTER — Ambulatory Visit (INDEPENDENT_AMBULATORY_CARE_PROVIDER_SITE_OTHER): Payer: Medicare Other | Admitting: Family Medicine

## 2019-11-17 ENCOUNTER — Encounter: Payer: Self-pay | Admitting: Family Medicine

## 2019-11-17 VITALS — BP 156/84 | HR 65 | Temp 97.7°F | Ht 70.0 in | Wt 227.0 lb

## 2019-11-17 DIAGNOSIS — E785 Hyperlipidemia, unspecified: Secondary | ICD-10-CM | POA: Diagnosis not present

## 2019-11-17 DIAGNOSIS — C3492 Malignant neoplasm of unspecified part of left bronchus or lung: Secondary | ICD-10-CM

## 2019-11-17 DIAGNOSIS — I10 Essential (primary) hypertension: Secondary | ICD-10-CM

## 2019-11-17 DIAGNOSIS — E782 Mixed hyperlipidemia: Secondary | ICD-10-CM | POA: Insufficient documentation

## 2019-11-17 DIAGNOSIS — Z902 Acquired absence of lung [part of]: Secondary | ICD-10-CM | POA: Diagnosis not present

## 2019-11-17 DIAGNOSIS — E038 Other specified hypothyroidism: Secondary | ICD-10-CM

## 2019-11-17 MED ORDER — ALBUTEROL SULFATE (2.5 MG/3ML) 0.083% IN NEBU: 2.5000 mg | INHALATION_SOLUTION | Freq: Four times a day (QID) | RESPIRATORY_TRACT | 5 refills | Status: DC | PRN

## 2019-11-17 MED ORDER — CLONIDINE HCL 0.1 MG PO TABS: 0.1000 mg | ORAL_TABLET | Freq: Two times a day (BID) | ORAL | 5 refills | Status: DC

## 2019-11-17 NOTE — Patient Instructions (Signed)
Check blood pressure at home-morning Fasting blood work Pulmonary referral

## 2019-11-17 NOTE — Progress Notes (Signed)
New Patient Office Visit  Subjective:  Patient ID: Randall Sullivan, male    DOB: [birth year]  Age: 69 y.o. MRN: [id number]  CC:  Chief Complaint  Patient presents with  . Establish Care  . Hyperlipidemia  . Cough    ? congestion    HPI Randall Sullivan presents for HTN-takes Clonidine 0.1 BID, Diltiazem 60mg  ER BID, metoprolol ER 50mg  qd-pt is not checking readings on home bp cuff  Lung cancer-no oxygen currently on a regular basis-concentrator at home-uses prn with exertion or with increase in humidity-stage one non small cell -1AT1N0-no f/u-suggestion will liey how we ea 1 sot drink  afib-noted in hospital-normal rhythm after surgery-took blood thinners post surgery 2017-asa daily Hyperlipidemia-pravastatin/fenofibrate-started 3 months ago No follow up lipid panel  Past Medical History:  Diagnosis Date  . A-fib ([place]) 2017   after last lung surgery patient had a history if Afib documented in hospital   . Allergy   . Arthritis    back & legs ( knees)  . Cancer ([place])    lung  . Complication of anesthesia    agitated upon waking from anesth.   . Emphysema   . Emphysema of lung ([place])   . History of lung cancer   . Hyperlipidemia   . Hypertension   . Oxygen dependent    2L continuous  . Tobacco abuse     Past Surgical History:  Procedure Laterality Date  . HERNIA REPAIR Bilateral 3009   umbilical  . VIDEO ASSISTED THORACOSCOPY Right 12/21/2018   Procedure: VIDEO ASSISTED THORACOSCOPY AND RESECTION OF BLEBS RIGHT LOWER LOBE;  Surgeon: [name], MD;  Location: [place];  Service: Thoracic;  Laterality: Right;  [name] VIDEO ASSISTED THORACOSCOPY (VATS)/ LOBECTOMY Left 08/01/2016   Procedure: VIDEO ASSISTED THORACOSCOPY (VATS)/ LEFT UPPER LOBECTOMY with lymph node sampling and onQ placement;  Surgeon: [name], MD;  Location: [place];  Service: Thoracic;  Laterality: Left;  [name] VIDEO ASSISTED THORACOSCOPY (VATS)/WEDGE RESECTION Right 12/09/2018   Procedure: VIDEO  ASSISTED THORACOSCOPY (VATS)/WEDGE RESECTION;  Surgeon: [name], MD;  Location: [place];  Service: Thoracic;  Laterality: Right;  [name] VIDEO BRONCHOSCOPY Bilateral 12/21/2018   Procedure: VIDEO BRONCHOSCOPY;  Surgeon: [name], MD;  Location: [place];  Service: Thoracic;  Laterality: Bilateral;  . VIDEO BRONCHOSCOPY WITH ENDOBRONCHIAL NAVIGATION N/A 07/03/2016   Procedure: VIDEO BRONCHOSCOPY WITH ENDOBRONCHIAL NAVIGATION;  Surgeon: [name], MD;  Location: [place];  Service: Thoracic;  Laterality: N/A;  . VIDEO BRONCHOSCOPY WITH INSERTION OF INTERBRONCHIAL VALVE (IBV) N/A 12/11/2018   Procedure: VIDEO BRONCHOSCOPY WITH INSERTION OF INTERBRONCHIAL VALVE (IBV);  Surgeon: [name], MD;  Location: [facility] OR;  Service: Thoracic;  Laterality: N/A;  . VIDEO BRONCHOSCOPY WITH INSERTION OF INTERBRONCHIAL VALVE (IBV) N/A 02/10/2019   Procedure: VIDEO BRONCHOSCOPY WITH REMOVAL OF INTERBRONCHIAL VALVE (IBV);  Surgeon: [name], MD;  Location: [facility];  Service: Thoracic;  Laterality: N/A;    Family History  Problem Relation Age of Onset  . Cancer Mother   . Cancer Sister   . Aneurysm Sister   . Kidney disease Paternal Aunt   . Asthma Brother   . Cancer - Lung Brother   . Cancer Brother     Social History   Socioeconomic History  . Marital status: Married    Spouse name: Not on file  . Number of children: Not on file  . Years of education: Not on file  . Highest education level: Not on  file  Occupational History  . Occupation: Vinyl siding  Tobacco Use  . Smoking status: Former Smoker    Packs/day: 1.00    Years: 40.00    Pack years: 40.00    Types: Cigarettes    Quit date: 05/28/2016    Years since quitting: 3.4  . Smokeless tobacco: Never Used  Substance and Sexual Activity  . Alcohol use: Not Currently    Comment: quit- 2016  . Drug use: No  . Sexual activity: Not Currently  Other Topics Concern  . Not on file  Social History  Narrative  . Not on file   Social Determinants of Health   Financial Resource Strain:   . Difficulty of Paying Living Expenses: Not on file  Food Insecurity:   . Worried About Charity fundraiser in the Last Year: Not on file  . Ran Out of Food in the Last Year: Not on file  Transportation Needs:   . Lack of Transportation (Medical): Not on file  . Lack of Transportation (Non-Medical): Not on file  Physical Activity:   . Days of Exercise per Week: Not on file  . Minutes of Exercise per Session: Not on file  Stress:   . Feeling of Stress : Not on file  Social Connections:   . Frequency of Communication with Friends and Family: Not on file  . Frequency of Social Gatherings with Friends and Family: Not on file  . Attends Religious Services: Not on file  . Active Member of Clubs or Organizations: Not on file  . Attends Archivist Meetings: Not on file  . Marital Status: Not on file  Intimate Partner Violence:   . Fear of Current or Ex-Partner: Not on file  . Emotionally Abused: Not on file  . Physically Abused: Not on file  . Sexually Abused: Not on file    ROS Review of Systems  Constitutional: Negative.  Negative for unexpected weight change.  Eyes: Negative.   Respiratory: Positive for cough and wheezing.        COPD  Endocrine: Negative.   Genitourinary: Negative.   Skin: Negative.   Allergic/Immunologic: Positive for environmental allergies.  Neurological: Negative.  Negative for dizziness and headaches.  Psychiatric/Behavioral: Negative.     Objective:   Today's Vitals: BP (!) 156/84 (BP Location: Left Arm, Patient Position: Sitting)   Pulse 65   Temp 97.7 F (36.5 C) (Temporal)   Ht 5\' 10"  (1.778 m)   Wt 227 lb (103 kg)   SpO2 97%   BMI 32.57 kg/m   Physical Exam Constitutional:      Appearance: Normal appearance.  Cardiovascular:     Rate and Rhythm: Normal rate and regular rhythm.     Pulses: Normal pulses.     Heart sounds: Normal heart  sounds.  Abdominal:     General: Abdomen is flat.     Palpations: Abdomen is soft.  Musculoskeletal:     Cervical back: Neck supple.  Skin:    General: Skin is warm and dry.     Capillary Refill: Capillary refill takes more than 3 seconds.  Neurological:     Mental Status: He is alert.     Assessment & Plan:  1. Malignant neoplasm of left lung, unspecified part of lung ([place]) limited lung function due to CA-previously seen by Dr. Luan Pulling - Ambulatory referral to Pulmonology 2. S/P lobectomy of lung No current follow up with pulmonary - Ambulatory referral to Pulmonology  3. Essential (primary) hypertension Not  well controlled-check bp readings at home-record readings - COMPLETE METABOLIC PANEL WITH GFR - Lipid panel  4. Hyperlipidemia, unspecified hyperlipidemia type Lipid panel-baseline with elevated trilglycerides and   5. TSH (thyroid-stimulating hormone deficiency) - COMPLETE METABOLIC PANEL WITH GFR  Outpatient Encounter Medications as of 11/17/2019  Medication Sig  . acetaminophen (TYLENOL) 500 MG tablet Take 1,000 mg by mouth every 6 (six) hours as needed (for arthritis pain.).   [name] albuterol (PROVENTIL) (2.5 MG/3ML) 0.083% nebulizer solution Take 2.5 mg by nebulization every 6 (six) hours as needed for wheezing or shortness of breath.   [name] aspirin EC 81 MG tablet Take 81 mg by mouth daily.  . cloNIDine (CATAPRES) 0.1 MG tablet Take 0.1 mg by mouth 2 (two) times daily.  [name] diltiazem (CARDIZEM SR) 60 MG 12 hr capsule Take 1 capsule (60 mg total) by mouth every 12 (twelve) hours.  . fenofibrate (TRICOR) 145 MG tablet Take 145 mg by mouth daily.  . fexofenadine (ALLEGRA) 180 MG tablet Take 180 mg by mouth daily as needed for allergies or rhinitis.  [name] guaiFENesin (MUCINEX) 600 MG 12 hr tablet Take 1 tablet (600 mg total) by mouth 2 (two) times daily as needed.  . meclizine (ANTIVERT) 25 MG tablet Take 1 tablet (25 mg total) by mouth 3 (three) times daily as needed for  dizziness. May cause drowsiness  . metoprolol succinate (TOPROL-XL) 50 MG 24 hr tablet Take 50 mg by mouth daily. Take with or immediately following a meal.  . OXYGEN Inhale 2-3 L into the lungs continuous. Hx of lung ca, copd, and emphysema  . pravastatin (PRAVACHOL) 20 MG tablet Take 20 mg by mouth daily.  . [DISCONTINUED] cloNIDine (CATAPRES) 0.1 MG tablet Take 1 tablet (0.1 mg total) by mouth daily.   No facility-administered encounter medications on file as of 11/17/2019.  1 month-bp readings Follow-up:   [facility]rey [place] Hannah Beat, MD

## 2019-11-23 ENCOUNTER — Encounter: Payer: Self-pay | Admitting: Family Medicine

## 2019-11-26 LAB — LIPID PANEL
Chol/HDL Ratio: 6.8 ratio — ABNORMAL HIGH (ref 0.0–5.0)
Cholesterol, Total: 205 mg/dL — ABNORMAL HIGH (ref 100–199)
HDL: 30 mg/dL — ABNORMAL LOW (ref >39)
LDL Chol Calc (NIH): 91 mg/dL (ref 0–99)
Triglycerides: 505 mg/dL — ABNORMAL HIGH (ref 0–149)
VLDL Cholesterol Cal: 84 mg/dL — ABNORMAL HIGH (ref 5–40)

## 2019-11-26 LAB — CBC WITH DIFFERENTIAL/PLATELET
Basophils Absolute: 0 10*3/uL (ref 0.0–0.2)
Basos: 0 %
EOS (ABSOLUTE): 0.1 10*3/uL (ref 0.0–0.4)
Eos: 1 %
Hematocrit: 44.2 % (ref 37.5–51.0)
Hemoglobin: 14.3 g/dL (ref 13.0–17.7)
Immature Grans (Abs): 0 10*3/uL (ref 0.0–0.1)
Immature Granulocytes: 0 %
Lymphocytes Absolute: 1.4 10*3/uL (ref 0.7–3.1)
Lymphs: 20 %
MCH: 27.2 pg (ref 26.6–33.0)
MCHC: 32.4 g/dL (ref 31.5–35.7)
MCV: 84 fL (ref 79–97)
Monocytes Absolute: 0.7 10*3/uL (ref 0.1–0.9)
Monocytes: 10 %
Neutrophils Absolute: 4.8 10*3/uL (ref 1.4–7.0)
Neutrophils: 69 %
Platelets: 248 10*3/uL (ref 150–450)
RBC: 5.25 x10E6/uL (ref 4.14–5.80)
RDW: 13.8 % (ref 11.6–15.4)
WBC: 7 10*3/uL (ref 3.4–10.8)

## 2019-11-29 ENCOUNTER — Inpatient Hospital Stay: Payer: Medicare Other

## 2019-11-29 ENCOUNTER — Other Ambulatory Visit: Payer: Self-pay

## 2019-11-29 ENCOUNTER — Inpatient Hospital Stay: Payer: Medicare Other | Attending: Internal Medicine

## 2019-11-29 ENCOUNTER — Telehealth: Payer: Self-pay | Admitting: Family Medicine

## 2019-11-29 ENCOUNTER — Ambulatory Visit (HOSPITAL_COMMUNITY)
Admission: RE | Admit: 2019-11-29 | Discharge: 2019-11-29 | Disposition: A | Discharge status: Home / Self Care | Payer: Medicare Other | Source: Ambulatory Visit | Attending: Internal Medicine | Admitting: Internal Medicine

## 2019-11-29 DIAGNOSIS — Z85118 Personal history of other malignant neoplasm of bronchus and lung: Secondary | ICD-10-CM | POA: Diagnosis not present

## 2019-11-29 DIAGNOSIS — C3490 Malignant neoplasm of unspecified part of unspecified bronchus or lung: Secondary | ICD-10-CM | POA: Diagnosis present

## 2019-11-29 LAB — CBC WITH DIFFERENTIAL (CANCER CENTER ONLY)
Abs Immature Granulocytes: 0.03 10*3/uL (ref 0.00–0.07)
Basophils Absolute: 0 10*3/uL (ref 0.0–0.1)
Basophils Relative: 0 %
Eosinophils Absolute: 0.1 10*3/uL (ref 0.0–0.5)
Eosinophils Relative: 2 %
HCT: 46.2 % (ref 39.0–52.0)
Hemoglobin: 14.7 g/dL (ref 13.0–17.0)
Immature Granulocytes: 0 %
Lymphocytes Relative: 21 %
Lymphs Abs: 1.5 10*3/uL (ref 0.7–4.0)
MCH: 27.5 pg (ref 26.0–34.0)
MCHC: 31.8 g/dL (ref 30.0–36.0)
MCV: 86.4 fL (ref 80.0–100.0)
Monocytes Absolute: 0.6 10*3/uL (ref 0.1–1.0)
Monocytes Relative: 9 %
Neutro Abs: 4.9 10*3/uL (ref 1.7–7.7)
Neutrophils Relative %: 68 %
Platelet Count: 251 10*3/uL (ref 150–400)
RBC: 5.35 MIL/uL (ref 4.22–5.81)
RDW: 14.3 % (ref 11.5–15.5)
WBC Count: 7.2 10*3/uL (ref 4.0–10.5)
nRBC: 0 % (ref 0.0–0.2)

## 2019-11-29 LAB — CMP (CANCER CENTER ONLY)
ALT: 41 U/L (ref 0–44)
AST: 30 U/L (ref 15–41)
Albumin: 4.4 g/dL (ref 3.5–5.0)
Alkaline Phosphatase: 42 U/L (ref 38–126)
Anion gap: 8 (ref 5–15)
BUN: 13 mg/dL (ref 8–23)
CO2: 28 mmol/L (ref 22–32)
Calcium: 9.1 mg/dL (ref 8.9–10.3)
Chloride: 103 mmol/L (ref 98–111)
Creatinine: 1.19 mg/dL (ref 0.61–1.24)
GFR, Est AFR Am: >60 mL/min (ref >60)
GFR, Estimated: >60 mL/min (ref >60)
Glucose, Bld: 92 mg/dL (ref 70–99)
Potassium: 4.8 mmol/L (ref 3.5–5.1)
Sodium: 139 mmol/L (ref 135–145)
Total Bilirubin: 0.6 mg/dL (ref 0.3–1.2)
Total Protein: 8 g/dL (ref 6.5–8.1)

## 2019-11-29 MED ORDER — SODIUM CHLORIDE (PF) 0.9 % IJ SOLN
INTRAMUSCULAR | Status: AC
  Filled 2019-11-29: qty 50

## 2019-11-29 MED ORDER — IOHEXOL 300 MG/ML  SOLN
75.0000 mL | Freq: Once | INTRAMUSCULAR | Status: AC | PRN
  Administered 2019-11-29: 75 mL via INTRAVENOUS

## 2019-11-29 NOTE — Progress Notes (Signed)
Pt made aware

## 2019-11-29 NOTE — Progress Notes (Signed)
Left message for pt to return call.

## 2019-11-29 NOTE — Telephone Encounter (Signed)
Pt states returning call to [facility]

## 2019-12-01 ENCOUNTER — Inpatient Hospital Stay (HOSPITAL_BASED_OUTPATIENT_CLINIC_OR_DEPARTMENT_OTHER): Payer: Medicare Other | Admitting: Internal Medicine

## 2019-12-01 ENCOUNTER — Other Ambulatory Visit: Payer: Self-pay

## 2019-12-01 ENCOUNTER — Encounter: Payer: Self-pay | Admitting: Internal Medicine

## 2019-12-01 DIAGNOSIS — C3490 Malignant neoplasm of unspecified part of unspecified bronchus or lung: Secondary | ICD-10-CM | POA: Diagnosis present

## 2019-12-01 DIAGNOSIS — C3492 Malignant neoplasm of unspecified part of left bronchus or lung: Secondary | ICD-10-CM

## 2019-12-01 NOTE — Progress Notes (Signed)
[facility] [phone]   [phone]) [phone]  PROGRESS NOTE FOR TELEMEDICINE VISITS  Maryruth Hancock, MD [address] [place] [address]  I connected with@ on 12/01/19 at 11:15 AM EST by telephone visit and verified that I am speaking with the correct person using two identifiers.   I discussed the limitations, risks, security and privacy concerns of performing an evaluation and management service by telemedicine and the availability of in-person appointments. I also discussed with the patient that there may be a patient responsible charge related to this service. The patient expressed understanding and agreed to proceed.  Other persons participating in the visit and their role in the encounter:  None  Patient's location:  Home Provider's location:    DIAGNOSIS: 1)Stage IA (T1b, N0, M0) non-small cell lung cancer, well-differentiated adenocarcinoma presented with left upper lobeS pulmonary nodule diagnosed in September 2017. 2)stage Ia non-small cell lung cancer diagnosed in March 2020.  PRIOR THERAPY: 1)Status post left VATS, left upper lobectomy with mediastinal lymph node dissection under the care of Dr. Roxan Hockey on 08/01/2016. 2)status post right VATS with wedge resection of right upper lobe nodule and bleb resection under the care of Dr. Roxan Hockey on December 10, 2018. 3)Status post bronchoscopy with endobronchial valve replacement in the anterior apical and posterior segments of right upper lobe and superior segment of right lower lobe  CURRENT THERAPY: Observation.  INTERVAL HISTORY: Randall Sullivan 69 y.o. male has a telephone virtual visit with me today for evaluation and discussion of his scan results.  The patient is feeling fine today with no concerning complaints.  He denied having any recent chest pain, shortness of breath, cough or hemoptysis.  He denied having any fever or chills.  He has no nausea, vomiting, diarrhea or  constipation.  He denied having any change in his allergy or medications.  He had repeat CT scan of the chest performed recently and we are having the visit for discussion of his discuss results and recommendation regarding his condition.  MEDICAL HISTORY: Past Medical History:  Diagnosis Date  . A-fib ([place]) 2017   after last lung surgery patient had a history if Afib documented in hospital   . Allergy   . Arthritis    back & legs ( knees)  . Cancer ([place])    lung  . Complication of anesthesia    agitated upon waking from anesth.   . Emphysema   . Emphysema of lung ([place])   . History of lung cancer   . Hyperlipidemia   . Hypertension   . Oxygen dependent    2L continuous  . Tobacco abuse     ALLERGIES:  is allergic to no known allergies.  MEDICATIONS:  Current Outpatient Medications  Medication Sig Dispense Refill  . acetaminophen (TYLENOL) 500 MG tablet Take 1,000 mg by mouth every 6 (six) hours as needed (for arthritis pain.).     [name] albuterol (PROVENTIL) (2.5 MG/3ML) 0.083% nebulizer solution Take 3 mLs (2.5 mg total) by nebulization every 6 (six) hours as needed for wheezing or shortness of breath. 75 mL 5  . aspirin EC 81 MG tablet Take 81 mg by mouth daily.    . cloNIDine (CATAPRES) 0.1 MG tablet Take 1 tablet (0.1 mg total) by mouth 2 (two) times daily. 60 tablet 5  . diltiazem (CARDIZEM SR) 60 MG 12 hr capsule Take 1 capsule (60 mg total) by mouth every 12 (twelve) hours. 60 capsule 3  . fenofibrate (TRICOR) 145 MG  tablet Take 145 mg by mouth daily.    . fexofenadine (ALLEGRA) 180 MG tablet Take 180 mg by mouth daily as needed for allergies or rhinitis.    [name] guaiFENesin (MUCINEX) 600 MG 12 hr tablet Take 1 tablet (600 mg total) by mouth 2 (two) times daily as needed. 60 tablet 3  . meclizine (ANTIVERT) 25 MG tablet Take 1 tablet (25 mg total) by mouth 3 (three) times daily as needed for dizziness. May cause drowsiness 30 tablet 0  . metoprolol succinate (TOPROL-XL) 50 MG 24  hr tablet Take 50 mg by mouth daily. Take with or immediately following a meal.    . OXYGEN Inhale 2-3 L into the lungs continuous. Hx of lung ca, copd, and emphysema    . pravastatin (PRAVACHOL) 20 MG tablet Take 20 mg by mouth daily.     No current facility-administered medications for this visit.    SURGICAL HISTORY:  Past Surgical History:  Procedure Laterality Date  . HERNIA REPAIR Bilateral 8850   umbilical  . VIDEO ASSISTED THORACOSCOPY Right 12/21/2018   Procedure: VIDEO ASSISTED THORACOSCOPY AND RESECTION OF BLEBS RIGHT LOWER LOBE;  Surgeon: [name], MD;  Location: [place];  Service: Thoracic;  Laterality: Right;  [name] VIDEO ASSISTED THORACOSCOPY (VATS)/ LOBECTOMY Left 08/01/2016   Procedure: VIDEO ASSISTED THORACOSCOPY (VATS)/ LEFT UPPER LOBECTOMY with lymph node sampling and onQ placement;  Surgeon: [name], MD;  Location: [place];  Service: Thoracic;  Laterality: Left;  [name] VIDEO ASSISTED THORACOSCOPY (VATS)/WEDGE RESECTION Right 12/09/2018   Procedure: VIDEO ASSISTED THORACOSCOPY (VATS)/WEDGE RESECTION;  Surgeon: [name], MD;  Location: [place];  Service: Thoracic;  Laterality: Right;  [name] VIDEO BRONCHOSCOPY Bilateral 12/21/2018   Procedure: VIDEO BRONCHOSCOPY;  Surgeon: [name], MD;  Location: [place];  Service: Thoracic;  Laterality: Bilateral;  . VIDEO BRONCHOSCOPY WITH ENDOBRONCHIAL NAVIGATION N/A 07/03/2016   Procedure: VIDEO BRONCHOSCOPY WITH ENDOBRONCHIAL NAVIGATION;  Surgeon: [name], MD;  Location: [place];  Service: Thoracic;  Laterality: N/A;  . VIDEO BRONCHOSCOPY WITH INSERTION OF INTERBRONCHIAL VALVE (IBV) N/A 12/11/2018   Procedure: VIDEO BRONCHOSCOPY WITH INSERTION OF INTERBRONCHIAL VALVE (IBV);  Surgeon: [name], MD;  Location: [facility];  Service: Thoracic;  Laterality: N/A;  . VIDEO BRONCHOSCOPY WITH INSERTION OF INTERBRONCHIAL VALVE (IBV) N/A 02/10/2019   Procedure: VIDEO BRONCHOSCOPY WITH REMOVAL OF  INTERBRONCHIAL VALVE (IBV);  Surgeon: [name], MD;  Location: [facility] OR;  Service: Thoracic;  Laterality: N/A;    REVIEW OF SYSTEMS:  A comprehensive review of systems was negative.    LABORATORY DATA: Lab Results  Component Value Date   WBC 7.2 11/29/2019   HGB 14.7 11/29/2019   HCT 46.2 11/29/2019   MCV 86.4 11/29/2019   PLT 251 11/29/2019      Chemistry      Component Value Date/Time   NA 139 11/29/2019 1408   NA 136 04/09/2017 1249   K 4.8 11/29/2019 1408   K 4.6 04/09/2017 1249   CL 103 11/29/2019 1408   CO2 28 11/29/2019 1408   CO2 27 04/09/2017 1249   BUN 13 11/29/2019 1408   BUN 23.9 04/09/2017 1249   CREATININE 1.19 11/29/2019 1408   CREATININE 1.4 (H) 04/09/2017 1249      Component Value Date/Time   CALCIUM 9.1 11/29/2019 1408   CALCIUM 9.6 04/09/2017 1249   ALKPHOS 42 11/29/2019 1408   ALKPHOS 58 04/09/2017 1249   AST 30 11/29/2019 1408   AST 13 04/09/2017  1249   ALT 41 11/29/2019 1408   ALT 9 04/09/2017 1249   BILITOT 0.6 11/29/2019 1408   BILITOT 1.01 04/09/2017 1249       RADIOGRAPHIC STUDIES: CT Chest W Contrast  Result Date: 11/29/2019 CLINICAL DATA:  Non-small cell lung cancer. EXAM: CT CHEST WITH CONTRAST TECHNIQUE: Multidetector CT imaging of the chest was performed during intravenous contrast administration. CONTRAST:  57mL OMNIPAQUE IOHEXOL 300 MG/ML  SOLN COMPARISON:  06/02/2019 FINDINGS: Cardiovascular: The heart size is normal. No pericardial effusion. Aortic atherosclerosis. Mediastinum/Nodes: Normal appearance of the thyroid gland. The trachea appears patent and is midline. Normal appearance of the esophagus. No enlarged mediastinal or hilar adenopathy. No supraclavicular or axillary adenopathy. Lungs/Pleura: No pleural effusion. Advanced changes of centrilobular and paraseptal emphysema. Postsurgical changes from left upper lobectomy identified. There also postsurgical changes within the right upper lobe compatible with previous  resections within the right lung. Stable area of soft tissue along the right upper lung suture line measuring 3.0 x 2.0 cm, image 41/2. Right lower lobe lung nodule measures 0.8 cm, image 101/5. Previously 0.9 cm. Calcified granuloma identified in the right middle lobe. Upper Abdomen: Hepatic steatosis. No acute abnormality Musculoskeletal: No chest wall abnormality. No acute or significant osseous findings. IMPRESSION: 1. Stable CT of the chest. 2. Stable area of soft tissue along the right upper lung suture line. 3. Right lower lobe lung nodule is slightly decreased in size in the interval. 4. Emphysema and aortic atherosclerosis. 5. Hepatic steatosis. Aortic Atherosclerosis (ICD10-I70.0) and Emphysema (ICD10-J43.9). Electronically Signed   By: Kerby Moors M.D.   On: 11/29/2019 16:42    ASSESSMENT AND PLAN: This is a very pleasant 69 years old white male with a stage IA non-small cell lung cancer status post left lower lobectomy with lymph node dissection under the care of Dr. Roxan Hockey in November 2017 and has been observation since that time. He was also diagnosed with a stage Ia non-small cell lung cancer in March 2020 and the patient underwent wedge resection of the upper lobe in March 2020. The patient is currently on observation and he is feeling fine today with no concerning complaints He had repeat CT scan of the chest performed recently.  The personally and independently reviewed the scans and discussed the results with the patient today. His a scan showed no concerning findings for disease recurrence or metastasis.  The right lower lobe nodule has slightly decreased in size. I recommended for the patient to continue on observation with repeat CT scan of the chest in 6 months. He was advised to call immediately if he has any concerning symptoms in the interval. I discussed the assessment and treatment plan with the patient. The patient was provided an opportunity to ask questions and all  were answered. The patient agreed with the plan and demonstrated an understanding of the instructions.   The patient was advised to call back or seek an in-person evaluation if the symptoms worsen or if the condition fails to improve as anticipated.  I provided 12 minutes of non face-to-face telephone visit time during this encounter, and > 50% was spent counseling as documented under my assessment & plan.  Eilleen Kempf, MD 12/01/2019 12:08 PM  Disclaimer: This note was dictated with voice recognition software. Similar sounding words can inadvertently be transcribed and may not be corrected upon review.

## 2019-12-02 ENCOUNTER — Telehealth: Payer: Self-pay | Admitting: Internal Medicine

## 2019-12-02 NOTE — Telephone Encounter (Signed)
Scheduled per los. Called and spoke with patient. Confirmed appt 

## 2019-12-16 ENCOUNTER — Other Ambulatory Visit: Payer: Self-pay

## 2019-12-16 ENCOUNTER — Encounter: Payer: Self-pay | Admitting: Family Medicine

## 2019-12-16 ENCOUNTER — Ambulatory Visit (INDEPENDENT_AMBULATORY_CARE_PROVIDER_SITE_OTHER): Payer: Medicare Other | Admitting: Family Medicine

## 2019-12-16 VITALS — BP 140/90 | HR 78 | Temp 97.8°F | Ht 70.0 in | Wt 226.6 lb

## 2019-12-16 DIAGNOSIS — I10 Essential (primary) hypertension: Secondary | ICD-10-CM | POA: Diagnosis not present

## 2019-12-16 DIAGNOSIS — C3492 Malignant neoplasm of unspecified part of left bronchus or lung: Secondary | ICD-10-CM

## 2019-12-16 DIAGNOSIS — J432 Centrilobular emphysema: Secondary | ICD-10-CM | POA: Diagnosis not present

## 2019-12-16 DIAGNOSIS — E785 Hyperlipidemia, unspecified: Secondary | ICD-10-CM

## 2019-12-16 NOTE — Progress Notes (Signed)
Established Patient Office Visit  Subjective:  Patient ID: Randall Sullivan, male    DOB: 02-15-1951  Age: 69 y.o. MRN: [id number]  CC:  Chief Complaint  Patient presents with  . Lung Screening    1 month f/u for neoplasm of the left lung last seen on 11/17/19. Patient home bp reading has been 128/75,135/86,134/93,132/77,131/98    HPI Randall Sullivan presents for h/o lung cancer-seen previously by  Dr. [name] care-pt needs a referral and prefers [place] HTN-bp readings at home confirmed, no headachs Past Medical History:  Diagnosis Date  . A-fib ([place]) 2017   after last lung surgery patient had a history if Afib documented in hospital   . Allergy   . Arthritis    back & legs ( knees)  . Cancer ([place])    lung  . Complication of anesthesia    agitated upon waking from anesth.   . Emphysema   . Emphysema of lung ([place])   . History of lung cancer   . Hyperlipidemia   . Hypertension   . Oxygen dependent    2L continuous  . Tobacco abuse     Past Surgical History:  Procedure Laterality Date  . HERNIA REPAIR Bilateral 1093   umbilical  . VIDEO ASSISTED THORACOSCOPY Right 12/21/2018   Procedure: VIDEO ASSISTED THORACOSCOPY AND RESECTION OF BLEBS RIGHT LOWER LOBE;  Surgeon: [name], MD;  Location: [place];  Service: Thoracic;  Laterality: Right;  [name] VIDEO ASSISTED THORACOSCOPY (VATS)/ LOBECTOMY Left 08/01/2016   Procedure: VIDEO ASSISTED THORACOSCOPY (VATS)/ LEFT UPPER LOBECTOMY with lymph node sampling and onQ placement;  Surgeon: [name], MD;  Location: [place];  Service: Thoracic;  Laterality: Left;  [name] VIDEO ASSISTED THORACOSCOPY (VATS)/WEDGE RESECTION Right 12/09/2018   Procedure: VIDEO ASSISTED THORACOSCOPY (VATS)/WEDGE RESECTION;  Surgeon: [name], MD;  Location: [place];  Service: Thoracic;  Laterality: Right;  [name] VIDEO BRONCHOSCOPY Bilateral 12/21/2018   Procedure: VIDEO BRONCHOSCOPY;  Surgeon: [name], MD;  Location: [place];   Service: Thoracic;  Laterality: Bilateral;  . VIDEO BRONCHOSCOPY WITH ENDOBRONCHIAL NAVIGATION N/A 07/03/2016   Procedure: VIDEO BRONCHOSCOPY WITH ENDOBRONCHIAL NAVIGATION;  Surgeon: [name], MD;  Location: [place];  Service: Thoracic;  Laterality: N/A;  . VIDEO BRONCHOSCOPY WITH INSERTION OF INTERBRONCHIAL VALVE (IBV) N/A 12/11/2018   Procedure: VIDEO BRONCHOSCOPY WITH INSERTION OF INTERBRONCHIAL VALVE (IBV);  Surgeon: [name], MD;  Location: [facility] OR;  Service: Thoracic;  Laterality: N/A;  . VIDEO BRONCHOSCOPY WITH INSERTION OF INTERBRONCHIAL VALVE (IBV) N/A 02/10/2019   Procedure: VIDEO BRONCHOSCOPY WITH REMOVAL OF INTERBRONCHIAL VALVE (IBV);  Surgeon: [name], MD;  Location: [facility];  Service: Thoracic;  Laterality: N/A;    Family History  Problem Relation Age of Onset  . Cancer Mother   . Cancer Sister   . Aneurysm Sister   . Kidney disease Paternal Aunt   . Asthma Brother   . Cancer - Lung Brother   . Cancer Brother     Social History   Socioeconomic History  . Marital status: Married    Spouse name: Not on file  . Number of children: Not on file  . Years of education: Not on file  . Highest education level: Not on file  Occupational History  . Occupation: Vinyl siding  Tobacco Use  . Smoking status: Former Smoker    Packs/day: 1.00    Years: 40.00    Pack years: 40.00    Types: Cigarettes  Quit date: 05/28/2016    Years since quitting: 3.5  . Smokeless tobacco: Never Used  Substance and Sexual Activity  . Alcohol use: Not Currently    Comment: quit- 2016  . Drug use: No  . Sexual activity: Not Currently  Other Topics Concern  . Not on file  Social History Narrative  . Not on file   Social Determinants of Health   Financial Resource Strain:   . Difficulty of Paying Living Expenses:   Food Insecurity:   . Worried About Charity fundraiser in the Last Year:   . Arboriculturist in the Last Year:   Transportation Needs:   .  Film/video editor (Medical):   [name] Lack of Transportation (Non-Medical):   Physical Activity:   . Days of Exercise per Week:   . Minutes of Exercise per Session:   Stress:   . Feeling of Stress :   Social Connections:   . Frequency of Communication with Friends and Family:   . Frequency of Social Gatherings with Friends and Family:   . Attends Religious Services:   . Active Member of Clubs or Organizations:   . Attends Archivist Meetings:   [name] Marital Status:   Intimate Partner Violence:   . Fear of Current or Ex-Partner:   . Emotionally Abused:   [name] Physically Abused:   . Sexually Abused:     Outpatient Medications Prior to Visit  Medication Sig Dispense Refill  . acetaminophen (TYLENOL) 500 MG tablet Take 1,000 mg by mouth every 6 (six) hours as needed (for arthritis pain.).     [name] albuterol (PROVENTIL) (2.5 MG/3ML) 0.083% nebulizer solution Take 3 mLs (2.5 mg total) by nebulization every 6 (six) hours as needed for wheezing or shortness of breath. 75 mL 5  . aspirin EC 81 MG tablet Take 81 mg by mouth daily.    . cloNIDine (CATAPRES) 0.1 MG tablet Take 1 tablet (0.1 mg total) by mouth 2 (two) times daily. 60 tablet 5  . diltiazem (CARDIZEM SR) 60 MG 12 hr capsule Take 1 capsule (60 mg total) by mouth every 12 (twelve) hours. 60 capsule 3  . fenofibrate (TRICOR) 145 MG tablet Take 145 mg by mouth daily.    . fexofenadine (ALLEGRA) 180 MG tablet Take 180 mg by mouth daily as needed for allergies or rhinitis.    [name] guaiFENesin (MUCINEX) 600 MG 12 hr tablet Take 1 tablet (600 mg total) by mouth 2 (two) times daily as needed. 60 tablet 3  . meclizine (ANTIVERT) 25 MG tablet Take 1 tablet (25 mg total) by mouth 3 (three) times daily as needed for dizziness. May cause drowsiness 30 tablet 0  . metoprolol succinate (TOPROL-XL) 50 MG 24 hr tablet Take 50 mg by mouth daily. Take with or immediately following a meal.    . OXYGEN Inhale 2-3 L into the lungs continuous. Hx of lung  ca, copd, and emphysema    . pravastatin (PRAVACHOL) 20 MG tablet Take 20 mg by mouth daily.     No facility-administered medications prior to visit.    Allergies  Allergen Reactions  . No Known Allergies     ROS Review of Systems  Respiratory: Negative.   Cardiovascular: Negative.       Objective:    Physical Exam  Constitutional: He is oriented to person, place, and time. He appears well-developed and well-nourished.  HENT:  Head: Normocephalic and atraumatic.  Cardiovascular: Normal rate, regular rhythm, normal heart sounds  and intact distal pulses.  Pulmonary/Chest: Effort normal and breath sounds normal.  Neurological: He is alert and oriented to person, place, and time.  Psychiatric: He has a normal mood and affect.    BP 140/90 (BP Location: Right Arm, Patient Position: Sitting, Cuff Size: Large)   Pulse 78   Temp 97.8 F (36.6 C) (Temporal)   Ht '5\' 10"'$  (1.778 m)   Wt 226 lb 9.6 oz (102.8 kg)   SpO2 98%   BMI 32.51 kg/m  Wt Readings from Last 3 Encounters:  12/16/19 226 lb 9.6 oz (102.8 kg)  11/17/19 227 lb (103 kg)  04/06/19 195 lb (88.5 kg)     Health Maintenance Due  Topic Date Due  . COLONOSCOPY  Never done    Lab Results  Component Value Date   TSH 1.520 08/08/2016   Lab Results  Component Value Date   WBC 7.2 11/29/2019   HGB 14.7 11/29/2019   HCT 46.2 11/29/2019   MCV 86.4 11/29/2019   PLT 251 11/29/2019   Lab Results  Component Value Date   NA 139 11/29/2019   K 4.8 11/29/2019   CHLORIDE 99 04/09/2017   CO2 28 11/29/2019   GLUCOSE 92 11/29/2019   BUN 13 11/29/2019   CREATININE 1.19 11/29/2019   BILITOT 0.6 11/29/2019   ALKPHOS 42 11/29/2019   AST 30 11/29/2019   ALT 41 11/29/2019   PROT 8.0 11/29/2019   ALBUMIN 4.4 11/29/2019   CALCIUM 9.1 11/29/2019   ANIONGAP 8 11/29/2019   EGFR 50 (L) 04/09/2017   Lab Results  Component Value Date   CHOL 205 (H) 11/25/2019   Lab Results  Component Value Date   HDL 30 (L)  11/25/2019   Lab Results  Component Value Date   LDLCALC 91 11/25/2019   Lab Results  Component Value Date   TRIG 505 (H) 11/25/2019   Lab Results  Component Value Date   CHOLHDL 6.8 (H) 11/25/2019      Assessment & Plan:   1. Hyperlipidemia, unspecified hyperlipidemia type Omega 3 1-2 grams/day + Tricor + Pravachol-improving-no side effects of concern  2. Essential (primary) hypertension Catapres, Cardizem, Toprol-XL-stable on home monitor-slightly elevated today  3. Malignant neoplasm of left lung, unspecified part of lung (HCC)-yearly f/u oncology - Ambulatory referral to Pulmonology  4. Centrilobular emphysema (HCC) No increase use of medication-nebulizer TID - Ambulatory referral to Pulmonology Follow-up:  Pulmonary referral and oncology referral  Tahani Potier Hannah Beat, MD

## 2019-12-16 NOTE — Patient Instructions (Signed)
Pt to continue taking Omega 3( take one to two grams/day) Continue to see pulmonary and oncology Find a new primary care provider

## 2019-12-29 ENCOUNTER — Telehealth: Payer: Self-pay | Admitting: Family Medicine

## 2019-12-29 ENCOUNTER — Other Ambulatory Visit: Payer: Self-pay | Admitting: Emergency Medicine

## 2019-12-29 NOTE — Telephone Encounter (Signed)
Is it ok to fill the diltiazem look like this is a heart med and I am not sure if you fill this or the heart dr fills this one

## 2019-12-29 NOTE — Telephone Encounter (Signed)
Patient is calling and requesting a refill on the following medication.   diltiazem (CARDIZEM SR) 60 MG 12 hr capsule   pravastatin (PRAVACHOL) 20 MG tablet   fenofibrate (TRICOR) 145 MG tablet    [address] [place], [place] [address] Phone:  [phone]  Fax:  [phone]

## 2019-12-29 NOTE — Telephone Encounter (Signed)
Ok to refill 

## 2019-12-30 ENCOUNTER — Other Ambulatory Visit: Payer: Self-pay | Admitting: Emergency Medicine

## 2019-12-30 DIAGNOSIS — E785 Hyperlipidemia, unspecified: Secondary | ICD-10-CM

## 2019-12-30 DIAGNOSIS — I480 Paroxysmal atrial fibrillation: Secondary | ICD-10-CM

## 2019-12-30 MED ORDER — FENOFIBRATE 145 MG PO TABS: 145.0000 mg | ORAL_TABLET | Freq: Every day | ORAL | 1 refills | Status: DC

## 2019-12-30 MED ORDER — DILTIAZEM HCL ER 60 MG PO CP12: 60.0000 mg | ORAL_CAPSULE | Freq: Two times a day (BID) | ORAL | 3 refills | Status: DC

## 2019-12-30 MED ORDER — PRAVASTATIN SODIUM 20 MG PO TABS: 20.0000 mg | ORAL_TABLET | Freq: Every day | ORAL | 1 refills | Status: DC

## 2020-01-06 ENCOUNTER — Encounter: Payer: Self-pay | Admitting: Family Medicine

## 2020-01-06 DIAGNOSIS — C3492 Malignant neoplasm of unspecified part of left bronchus or lung: Secondary | ICD-10-CM

## 2020-01-06 MED ORDER — ALBUTEROL SULFATE (2.5 MG/3ML) 0.083% IN NEBU: 2.5000 mg | INHALATION_SOLUTION | Freq: Four times a day (QID) | RESPIRATORY_TRACT | 5 refills | Status: DC | PRN

## 2020-02-08 ENCOUNTER — Other Ambulatory Visit: Payer: Self-pay

## 2020-02-08 ENCOUNTER — Encounter: Payer: Self-pay | Admitting: Family Medicine

## 2020-02-08 DIAGNOSIS — C3492 Malignant neoplasm of unspecified part of left bronchus or lung: Secondary | ICD-10-CM

## 2020-02-08 MED ORDER — ALBUTEROL SULFATE (2.5 MG/3ML) 0.083% IN NEBU: 2.5000 mg | INHALATION_SOLUTION | Freq: Four times a day (QID) | RESPIRATORY_TRACT | 5 refills | Status: DC | PRN

## 2020-03-21 ENCOUNTER — Other Ambulatory Visit: Payer: Self-pay

## 2020-03-21 ENCOUNTER — Encounter (INDEPENDENT_AMBULATORY_CARE_PROVIDER_SITE_OTHER): Payer: Self-pay | Admitting: Internal Medicine

## 2020-03-21 ENCOUNTER — Ambulatory Visit (INDEPENDENT_AMBULATORY_CARE_PROVIDER_SITE_OTHER): Payer: Medicare Other | Admitting: Internal Medicine

## 2020-03-21 VITALS — BP 150/80 | HR 88 | Temp 97.8°F | Ht 70.0 in | Wt 225.0 lb

## 2020-03-21 DIAGNOSIS — E782 Mixed hyperlipidemia: Secondary | ICD-10-CM | POA: Diagnosis not present

## 2020-03-21 DIAGNOSIS — C3492 Malignant neoplasm of unspecified part of left bronchus or lung: Secondary | ICD-10-CM | POA: Diagnosis not present

## 2020-03-21 DIAGNOSIS — E669 Obesity, unspecified: Secondary | ICD-10-CM | POA: Diagnosis not present

## 2020-03-21 DIAGNOSIS — I10 Essential (primary) hypertension: Secondary | ICD-10-CM

## 2020-03-21 MED ORDER — ALBUTEROL SULFATE (2.5 MG/3ML) 0.083% IN NEBU: 2.5000 mg | INHALATION_SOLUTION | Freq: Four times a day (QID) | RESPIRATORY_TRACT | 5 refills | Status: DC | PRN

## 2020-03-21 MED ORDER — METOPROLOL SUCCINATE ER 50 MG PO TB24: 50.0000 mg | ORAL_TABLET | Freq: Every day | ORAL | 1 refills | Status: DC

## 2020-03-21 NOTE — Progress Notes (Signed)
Metrics: Intervention Frequency ACO  Documented Smoking Status Yearly  Screened one or more times in 24 months  Cessation Counseling or  Active cessation medication Past 24 months  Past 24 months   Guideline developer: UpToDate (See UpToDate for funding source) Date Released: 2014       Wellness Office Visit  Subjective:  Patient ID: Randall Sullivan, male    DOB: 09-30-1951  Age: 69 y.o. MRN: [id number]  CC: This 69 year old man comes to our practice as a new patient to establish care. HPI  He has a history of lung cancer, hypertension, hyperlipidemia. He would like to reduce blood pressure medications and control his blood pressure better.  Thankfully, he has no history of coronary artery disease or cerebrovascular disease.  He is oxygen dependent.  He is an ex-smoker. Past Medical History:  Diagnosis Date  . A-fib ([place]) 2017   after last lung surgery patient had a history if Afib documented in hospital   . Allergy   . Arthritis    back & legs ( knees)  . Cancer ([place])    lung  . Complication of anesthesia    agitated upon waking from anesth.   . Emphysema   . Emphysema of lung ([place])   . History of lung cancer   . Hyperlipidemia   . Hypertension   . Oxygen dependent    2L continuous  . Tobacco abuse    Past Surgical History:  Procedure Laterality Date  . HERNIA REPAIR Bilateral 9381   umbilical  . VIDEO ASSISTED THORACOSCOPY Right 12/21/2018   Procedure: VIDEO ASSISTED THORACOSCOPY AND RESECTION OF BLEBS RIGHT LOWER LOBE;  Surgeon: [name], MD;  Location: [place];  Service: Thoracic;  Laterality: Right;  [name] VIDEO ASSISTED THORACOSCOPY (VATS)/ LOBECTOMY Left 08/01/2016   Procedure: VIDEO ASSISTED THORACOSCOPY (VATS)/ LEFT UPPER LOBECTOMY with lymph node sampling and onQ placement;  Surgeon: [name], MD;  Location: [place];  Service: Thoracic;  Laterality: Left;  [name] VIDEO ASSISTED THORACOSCOPY (VATS)/WEDGE RESECTION Right 12/09/2018   Procedure: VIDEO  ASSISTED THORACOSCOPY (VATS)/WEDGE RESECTION;  Surgeon: [name], MD;  Location: [place];  Service: Thoracic;  Laterality: Right;  [name] VIDEO BRONCHOSCOPY Bilateral 12/21/2018   Procedure: VIDEO BRONCHOSCOPY;  Surgeon: [name], MD;  Location: [place];  Service: Thoracic;  Laterality: Bilateral;  . VIDEO BRONCHOSCOPY WITH ENDOBRONCHIAL NAVIGATION N/A 07/03/2016   Procedure: VIDEO BRONCHOSCOPY WITH ENDOBRONCHIAL NAVIGATION;  Surgeon: [name], MD;  Location: [place];  Service: Thoracic;  Laterality: N/A;  . VIDEO BRONCHOSCOPY WITH INSERTION OF INTERBRONCHIAL VALVE (IBV) N/A 12/11/2018   Procedure: VIDEO BRONCHOSCOPY WITH INSERTION OF INTERBRONCHIAL VALVE (IBV);  Surgeon: [name], MD;  Location: [facility] OR;  Service: Thoracic;  Laterality: N/A;  . VIDEO BRONCHOSCOPY WITH INSERTION OF INTERBRONCHIAL VALVE (IBV) N/A 02/10/2019   Procedure: VIDEO BRONCHOSCOPY WITH REMOVAL OF INTERBRONCHIAL VALVE (IBV);  Surgeon: [name], MD;  Location: [facility] OR;  Service: Thoracic;  Laterality: N/A;     Family History  Problem Relation Age of Onset  . Cancer Mother   . Cancer Sister   . Aneurysm Sister   . Kidney disease Paternal Aunt   . Asthma Brother   . Cancer - Lung Brother   . 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   Social History   Tobacco Use  . Smoking status: Former Smoker    Packs/day: 1.00  Years: 40.00    Pack years: 40.00    Types: Cigarettes    Quit date: 05/28/2016    Years since quitting: 3.8  . Smokeless tobacco: Never Used  Substance Use Topics  . Alcohol use: Not Currently    Comment: quit- 2016    Current Meds  Medication Sig  . acetaminophen (TYLENOL) 500 MG tablet Take 1,000 mg by mouth every 6 (six) hours as needed (for arthritis pain.).   [name] albuterol (PROVENTIL) (2.5 MG/3ML) 0.083% nebulizer solution Take 3 mLs (2.5  mg total) by nebulization every 6 (six) hours as needed for wheezing or shortness of breath.  [name] aspirin EC 81 MG tablet Take 81 mg by mouth daily.  . cloNIDine (CATAPRES) 0.1 MG tablet Take 1 tablet (0.1 mg total) by mouth 2 (two) times daily.  [name] diltiazem (CARDIZEM SR) 60 MG 12 hr capsule Take 1 capsule (60 mg total) by mouth every 12 (twelve) hours.  . fenofibrate (TRICOR) 145 MG tablet Take 1 tablet (145 mg total) by mouth daily.  . fexofenadine (ALLEGRA) 180 MG tablet Take 180 mg by mouth daily as needed for allergies or rhinitis.  [name] meclizine (ANTIVERT) 25 MG tablet Take 1 tablet (25 mg total) by mouth 3 (three) times daily as needed for dizziness. May cause drowsiness  . metoprolol succinate (TOPROL-XL) 50 MG 24 hr tablet Take 1 tablet (50 mg total) by mouth daily. Take with or immediately following a meal.  . OXYGEN Inhale 2-3 L into the lungs continuous. Hx of lung ca, copd, and emphysema  . pravastatin (PRAVACHOL) 20 MG tablet Take 1 tablet (20 mg total) by mouth daily.  . [DISCONTINUED] albuterol (PROVENTIL) (2.5 MG/3ML) 0.083% nebulizer solution Take 3 mLs (2.5 mg total) by nebulization every 6 (six) hours as needed for wheezing or shortness of breath.  . [DISCONTINUED] metoprolol succinate (TOPROL-XL) 50 MG 24 hr tablet Take 50 mg by mouth daily. Take with or immediately following a meal.       Depression screen [facility] 2/9 12/16/2019 11/17/2019 03/27/2017 02/08/2017 12/31/2016  Decreased Interest 0 0 0 0 0  Down, Depressed, Hopeless 0 0 0 0 0  PHQ - 2 Score 0 0 0 0 0  Some recent data might be hidden     Objective:   Today's Vitals: BP (!) 150/80 (BP Location: Right Arm, Patient Position: Sitting, Cuff Size: Normal)   Pulse 88   Temp 97.8 F (36.6 C) (Temporal)   Ht 5\' 10"  (5.573 m)   Wt 225 lb (102.1 kg)   BMI 32.28 kg/m  Vitals with BMI 03/21/2020 12/16/2019 12/16/2019  Height 5\' 10"  - 5\' 10"   Weight 225 lbs - 226 lbs 10 oz  BMI 22.02 - 54.27  Systolic 062 376 283  Diastolic  80 90 92  Pulse 88 - 78     Physical Exam  He looks systemically well.  He is obese.  Blood pressure is not well controlled although diastolic is acceptable.  He is alert and orientated without any focal neurological signs.     Assessment   1. Essential (primary) hypertension   2. Mixed hyperlipidemia   3. Primary adenocarcinoma of left lung (HCC)   4. Obesity (BMI 30-39.9)       Tests ordered No orders of the defined types were placed in this encounter.    Plan: 1. He will continue on current antihypertensive medications and I have refilled the metoprolol today as it required it. 2. He will continue with statin therapy as  before for the time being. 3. He is obese he needs to improve him we discussed briefly the importance of this which will also help his blood pressure.  I discussed the concept of intermittent fasting and if he can fast for 16 hours every day, I think we will start to achieve results.  On another visit, I will discuss the details of nutrition in more detail. 4. Follow-up in 4 weeks.   Meds ordered this encounter  Medications  . metoprolol succinate (TOPROL-XL) 50 MG 24 hr tablet    Sig: Take 1 tablet (50 mg total) by mouth daily. Take with or immediately following a meal.    Dispense:  90 tablet    Refill:  1  . albuterol (PROVENTIL) (2.5 MG/3ML) 0.083% nebulizer solution    Sig: Take 3 mLs (2.5 mg total) by nebulization every 6 (six) hours as needed for wheezing or shortness of breath.    Dispense:  75 mL    Refill:  5    Enoc Getter Luther Parody, MD

## 2020-04-20 ENCOUNTER — Ambulatory Visit (INDEPENDENT_AMBULATORY_CARE_PROVIDER_SITE_OTHER): Payer: Medicare Other | Admitting: Internal Medicine

## 2020-04-25 ENCOUNTER — Other Ambulatory Visit: Payer: Self-pay | Admitting: Family Medicine

## 2020-04-25 DIAGNOSIS — I480 Paroxysmal atrial fibrillation: Secondary | ICD-10-CM

## 2020-05-08 ENCOUNTER — Ambulatory Visit (INDEPENDENT_AMBULATORY_CARE_PROVIDER_SITE_OTHER): Payer: Medicare Other | Admitting: Internal Medicine

## 2020-05-08 ENCOUNTER — Other Ambulatory Visit: Payer: Self-pay

## 2020-05-08 ENCOUNTER — Encounter (INDEPENDENT_AMBULATORY_CARE_PROVIDER_SITE_OTHER): Payer: Self-pay | Admitting: Internal Medicine

## 2020-05-08 VITALS — BP 140/70 | HR 62 | Temp 97.5°F | Ht 70.0 in | Wt 215.8 lb

## 2020-05-08 DIAGNOSIS — I10 Essential (primary) hypertension: Secondary | ICD-10-CM

## 2020-05-08 DIAGNOSIS — E559 Vitamin D deficiency, unspecified: Secondary | ICD-10-CM

## 2020-05-08 DIAGNOSIS — E782 Mixed hyperlipidemia: Secondary | ICD-10-CM

## 2020-05-08 DIAGNOSIS — I480 Paroxysmal atrial fibrillation: Secondary | ICD-10-CM

## 2020-05-08 DIAGNOSIS — E669 Obesity, unspecified: Secondary | ICD-10-CM

## 2020-05-08 DIAGNOSIS — R351 Nocturia: Secondary | ICD-10-CM

## 2020-05-08 DIAGNOSIS — N401 Enlarged prostate with lower urinary tract symptoms: Secondary | ICD-10-CM

## 2020-05-08 MED ORDER — CLONIDINE HCL 0.1 MG PO TABS: 0.1000 mg | ORAL_TABLET | Freq: Two times a day (BID) | ORAL | 5 refills | Status: DC

## 2020-05-08 MED ORDER — DILTIAZEM HCL ER 60 MG PO CP12: 60.0000 mg | ORAL_CAPSULE | Freq: Two times a day (BID) | ORAL | 3 refills | Status: DC

## 2020-05-08 NOTE — Progress Notes (Signed)
Metrics: Intervention Frequency ACO  Documented Smoking Status Yearly  Screened one or more times in 24 months  Cessation Counseling or  Active cessation medication Past 24 months  Past 24 months   Guideline developer: UpToDate (See UpToDate for funding source) Date Released: 2014       Wellness Office Visit  Subjective:  Patient ID: Randall Sullivan, male    DOB: May 18, 1951  Age: 69 y.o. MRN: [id number]  CC: This man comes in for follow-up of hypertension, hyperlipidemia and obesity. HPI  He also tells me he seems to have symptoms in terms of BPH with nocturia, poor stream of micturition but the symptom is not as worrisome. He has been doing very well with nutrition and has been doing intermittent fasting 16 hours every day and has managed to lose weight. He continues on diltiazem and clonidine for his hypertension and also continues on pravastatin for his hyperlipidemia. Past Medical History:  Diagnosis Date  . A-fib ([place]) 2017   after last lung surgery patient had a history if Afib documented in hospital   . Allergy   . Arthritis    back & legs ( knees)  . Cancer ([place])    lung  . Complication of anesthesia    agitated upon waking from anesth.   . Emphysema   . Emphysema of lung ([place])   . History of lung cancer   . Hyperlipidemia   . Hypertension   . Oxygen dependent    2L continuous  . Tobacco abuse    Past Surgical History:  Procedure Laterality Date  . HERNIA REPAIR Bilateral 7209   umbilical  . VIDEO ASSISTED THORACOSCOPY Right 12/21/2018   Procedure: VIDEO ASSISTED THORACOSCOPY AND RESECTION OF BLEBS RIGHT LOWER LOBE;  Surgeon: [name], MD;  Location: [place];  Service: Thoracic;  Laterality: Right;  [name] VIDEO ASSISTED THORACOSCOPY (VATS)/ LOBECTOMY Left 08/01/2016   Procedure: VIDEO ASSISTED THORACOSCOPY (VATS)/ LEFT UPPER LOBECTOMY with lymph node sampling and onQ placement;  Surgeon: [name], MD;  Location: [facility];  Service: Thoracic;   Laterality: Left;  [name] VIDEO ASSISTED THORACOSCOPY (VATS)/WEDGE RESECTION Right 12/09/2018   Procedure: VIDEO ASSISTED THORACOSCOPY (VATS)/WEDGE RESECTION;  Surgeon: [name], MD;  Location: [facility];  Service: Thoracic;  Laterality: Right;  [name] VIDEO BRONCHOSCOPY Bilateral 12/21/2018   Procedure: VIDEO BRONCHOSCOPY;  Surgeon: [name], MD;  Location: [place];  Service: Thoracic;  Laterality: Bilateral;  . VIDEO BRONCHOSCOPY WITH ENDOBRONCHIAL NAVIGATION N/A 07/03/2016   Procedure: VIDEO BRONCHOSCOPY WITH ENDOBRONCHIAL NAVIGATION;  Surgeon: [name], MD;  Location: [facility];  Service: Thoracic;  Laterality: N/A;  . VIDEO BRONCHOSCOPY WITH INSERTION OF INTERBRONCHIAL VALVE (IBV) N/A 12/11/2018   Procedure: VIDEO BRONCHOSCOPY WITH INSERTION OF INTERBRONCHIAL VALVE (IBV);  Surgeon: [name], MD;  Location: [facility] OR;  Service: Thoracic;  Laterality: N/A;  . VIDEO BRONCHOSCOPY WITH INSERTION OF INTERBRONCHIAL VALVE (IBV) N/A 02/10/2019   Procedure: VIDEO BRONCHOSCOPY WITH REMOVAL OF INTERBRONCHIAL VALVE (IBV);  Surgeon: [name], MD;  Location: [facility] OR;  Service: Thoracic;  Laterality: N/A;     Family History  Problem Relation Age of Onset  . Cancer Mother   . Cancer Sister   . Aneurysm Sister   . Kidney disease Paternal Aunt   . Asthma Brother   . Cancer - Lung Brother   . 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  Social History   Tobacco Use  . Smoking status: Former Smoker    Packs/day: 1.00    Years: 40.00    Pack years: 40.00    Types: Cigarettes    Quit date: 05/28/2016    Years since quitting: 3.9  . Smokeless tobacco: Never Used  Substance Use Topics  . Alcohol use: Not Currently    Comment: quit- 2016    Current Meds  Medication Sig  . acetaminophen (TYLENOL) 500 MG tablet Take 1,000 mg by mouth every 6 (six) hours as  needed (for arthritis pain.).   [name] albuterol (PROVENTIL) (2.5 MG/3ML) 0.083% nebulizer solution Take 3 mLs (2.5 mg total) by nebulization every 6 (six) hours as needed for wheezing or shortness of breath.  [name] aspirin EC 81 MG tablet Take 81 mg by mouth daily.  . cloNIDine (CATAPRES) 0.1 MG tablet Take 1 tablet (0.1 mg total) by mouth 2 (two) times daily.  [name] diltiazem (CARDIZEM SR) 60 MG 12 hr capsule Take 1 capsule (60 mg total) by mouth every 12 (twelve) hours.  . fenofibrate (TRICOR) 145 MG tablet Take 1 tablet (145 mg total) by mouth daily.  . fexofenadine (ALLEGRA) 180 MG tablet Take 180 mg by mouth daily as needed for allergies or rhinitis.  . metoprolol succinate (TOPROL-XL) 50 MG 24 hr tablet Take 1 tablet (50 mg total) by mouth daily. Take with or immediately following a meal.  . OXYGEN Inhale 2-3 L into the lungs continuous. Hx of lung ca, copd, and emphysema  . pravastatin (PRAVACHOL) 20 MG tablet Take 1 tablet (20 mg total) by mouth daily.  . [DISCONTINUED] cloNIDine (CATAPRES) 0.1 MG tablet Take 1 tablet (0.1 mg total) by mouth 2 (two) times daily.  . [DISCONTINUED] diltiazem (CARDIZEM SR) 60 MG 12 hr capsule Take 1 capsule (60 mg total) by mouth every 12 (twelve) hours.      Depression screen [facility] 2/9 12/16/2019 11/17/2019 03/27/2017 02/08/2017 12/31/2016  Decreased Interest 0 0 0 0 0  Down, Depressed, Hopeless 0 0 0 0 0  PHQ - 2 Score 0 0 0 0 0  Some recent data might be hidden     Objective:   Today's Vitals: BP 140/70 (BP Location: Left Arm, Patient Position: Sitting, Cuff Size: Normal)   Pulse 62   Temp (!) 97.5 F (36.4 C) (Temporal)   Ht 5\' 10"  (1.778 m)   Wt 215 lb 12.8 oz (97.9 kg)   SpO2 95%   BMI 30.96 kg/m  Vitals with BMI 05/08/2020 03/21/2020 12/16/2019  Height 5\' 10"  5\' 10"  -  Weight 215 lbs 13 oz 225 lbs -  BMI 74.12 87.86 -  Systolic 767 209 470  Diastolic 70 80 90  Pulse 62 88 -     Physical Exam  He looks systemically well.  He has lost 10 pounds since  the last time I saw him.  Blood pressure has improved also.  He is alert and orientated without any obvious focal neurological signs.     Assessment   1. Essential (primary) hypertension   2. Mixed hyperlipidemia   3. Obesity (BMI 30-39.9)   4. Vitamin D deficiency disease   5. Benign prostatic hyperplasia with nocturia   6. PAF (paroxysmal atrial fibrillation) ([place])       Tests ordered Orders Placed This Encounter  Procedures  . CBC  . COMPLETE METABOLIC PANEL WITH GFR  . Lipid panel  . T3, free  . T4  . TSH  . VITAMIN D 25  Hydroxy (Vit-D Deficiency, Fractures)  . PSA, Total with Reflex to PSA, Free     Plan: 1. Blood work is ordered. 2. He will continue with clonidine and diltiazem for his hypertension and I have refilled his medications today. 3. He will continue with pravastatin for his hyperlipidemia and we will check a lipid panel today. 4. Further recommendations will depend on blood results and I will see him in about 3 months time for follow-up.   Meds ordered this encounter  Medications  . cloNIDine (CATAPRES) 0.1 MG tablet    Sig: Take 1 tablet (0.1 mg total) by mouth 2 (two) times daily.    Dispense:  60 tablet    Refill:  5  . diltiazem (CARDIZEM SR) 60 MG 12 hr capsule    Sig: Take 1 capsule (60 mg total) by mouth every 12 (twelve) hours.    Dispense:  60 capsule    Refill:  3    Marlana Mckowen Luther Parody, MD

## 2020-05-10 LAB — CBC
HCT: 45.9 % (ref 38.5–50.0)
Hemoglobin: 14.6 g/dL (ref 13.2–17.1)
MCH: 26.9 pg — ABNORMAL LOW (ref 27.0–33.0)
MCHC: 31.8 g/dL — ABNORMAL LOW (ref 32.0–36.0)
MCV: 84.5 fL (ref 80.0–100.0)
MPV: 11.4 fL (ref 7.5–12.5)
Platelets: 250 10*3/uL (ref 140–400)
RBC: 5.43 10*6/uL (ref 4.20–5.80)
RDW: 13.9 % (ref 11.0–15.0)
WBC: 6.8 10*3/uL (ref 3.8–10.8)

## 2020-05-10 LAB — LIPID PANEL
Cholesterol: 200 mg/dL — ABNORMAL HIGH (ref <200)
HDL: 31 mg/dL — ABNORMAL LOW (ref >=40)
Non-HDL Cholesterol (Calc): 169 mg/dL (calc) — ABNORMAL HIGH (ref <130)
Total CHOL/HDL Ratio: 6.5 (calc) — ABNORMAL HIGH (ref <5.0)
Triglycerides: 465 mg/dL — ABNORMAL HIGH (ref <150)

## 2020-05-10 LAB — COMPLETE METABOLIC PANEL WITH GFR
AG Ratio: 1.4 (calc) (ref 1.0–2.5)
ALT: 29 U/L (ref 9–46)
AST: 27 U/L (ref 10–35)
Albumin: 4.5 g/dL (ref 3.6–5.1)
Alkaline phosphatase (APISO): 44 U/L (ref 35–144)
BUN/Creatinine Ratio: 12 (calc) (ref 6–22)
BUN: 14 mg/dL (ref 7–25)
CO2: 23 mmol/L (ref 20–32)
Calcium: 9.5 mg/dL (ref 8.6–10.3)
Chloride: 105 mmol/L (ref 98–110)
Creat: 1.21 mg/dL — ABNORMAL HIGH (ref 0.70–1.18)
GFR, Est African American: 70 mL/min/{1.73_m2} (ref >=60)
GFR, Est Non African American: 60 mL/min/{1.73_m2} (ref >=60)
Globulin: 3.2 g/dL (calc) (ref 1.9–3.7)
Glucose, Bld: 92 mg/dL (ref 65–99)
Potassium: 4.6 mmol/L (ref 3.5–5.3)
Sodium: 142 mmol/L (ref 135–146)
Total Bilirubin: 0.7 mg/dL (ref 0.2–1.2)
Total Protein: 7.7 g/dL (ref 6.1–8.1)

## 2020-05-10 LAB — T4: T4, Total: 8.8 ug/dL (ref 4.9–10.5)

## 2020-05-10 LAB — T3, FREE: T3, Free: 3.4 pg/mL (ref 2.3–4.2)

## 2020-05-10 LAB — TSH: TSH: 0.82 mIU/L (ref 0.40–4.50)

## 2020-05-10 LAB — PSA, TOTAL WITH REFLEX TO PSA, FREE: PSA, Total: 3.8 ng/mL (ref <=4.0)

## 2020-05-10 LAB — VITAMIN D 25 HYDROXY (VIT D DEFICIENCY, FRACTURES): Vit D, 25-Hydroxy: 11 ng/mL — ABNORMAL LOW (ref 30–100)

## 2020-05-17 ENCOUNTER — Encounter (INDEPENDENT_AMBULATORY_CARE_PROVIDER_SITE_OTHER): Payer: Self-pay | Admitting: Internal Medicine

## 2020-05-18 ENCOUNTER — Other Ambulatory Visit (INDEPENDENT_AMBULATORY_CARE_PROVIDER_SITE_OTHER): Payer: Self-pay | Admitting: Nurse Practitioner

## 2020-05-18 DIAGNOSIS — C3492 Malignant neoplasm of unspecified part of left bronchus or lung: Secondary | ICD-10-CM

## 2020-05-18 MED ORDER — ALBUTEROL SULFATE (2.5 MG/3ML) 0.083% IN NEBU: 2.5000 mg | INHALATION_SOLUTION | Freq: Four times a day (QID) | RESPIRATORY_TRACT | 5 refills | Status: DC | PRN

## 2020-05-24 ENCOUNTER — Encounter: Payer: Self-pay | Admitting: Internal Medicine

## 2020-06-02 ENCOUNTER — Ambulatory Visit (HOSPITAL_COMMUNITY)
Admission: RE | Admit: 2020-06-02 | Discharge: 2020-06-02 | Disposition: A | Discharge status: Home / Self Care | Payer: Medicare Other | Source: Ambulatory Visit | Attending: Internal Medicine | Admitting: Internal Medicine

## 2020-06-02 ENCOUNTER — Encounter (HOSPITAL_COMMUNITY): Payer: Self-pay

## 2020-06-02 ENCOUNTER — Inpatient Hospital Stay: Payer: Medicare Other | Attending: Internal Medicine

## 2020-06-02 ENCOUNTER — Other Ambulatory Visit: Payer: Self-pay

## 2020-06-02 DIAGNOSIS — C3490 Malignant neoplasm of unspecified part of unspecified bronchus or lung: Secondary | ICD-10-CM | POA: Diagnosis not present

## 2020-06-02 DIAGNOSIS — C3412 Malignant neoplasm of upper lobe, left bronchus or lung: Secondary | ICD-10-CM | POA: Diagnosis not present

## 2020-06-02 LAB — CBC WITH DIFFERENTIAL (CANCER CENTER ONLY)
Abs Immature Granulocytes: 0.04 10*3/uL (ref 0.00–0.07)
Basophils Absolute: 0 10*3/uL (ref 0.0–0.1)
Basophils Relative: 1 %
Eosinophils Absolute: 0.2 10*3/uL (ref 0.0–0.5)
Eosinophils Relative: 2 %
HCT: 46.1 % (ref 39.0–52.0)
Hemoglobin: 14.7 g/dL (ref 13.0–17.0)
Immature Granulocytes: 1 %
Lymphocytes Relative: 21 %
Lymphs Abs: 1.6 10*3/uL (ref 0.7–4.0)
MCH: 27.2 pg (ref 26.0–34.0)
MCHC: 31.9 g/dL (ref 30.0–36.0)
MCV: 85.2 fL (ref 80.0–100.0)
Monocytes Absolute: 0.7 10*3/uL (ref 0.1–1.0)
Monocytes Relative: 10 %
Neutro Abs: 4.9 10*3/uL (ref 1.7–7.7)
Neutrophils Relative %: 65 %
Platelet Count: 254 10*3/uL (ref 150–400)
RBC: 5.41 MIL/uL (ref 4.22–5.81)
RDW: 14.6 % (ref 11.5–15.5)
WBC Count: 7.5 10*3/uL (ref 4.0–10.5)
nRBC: 0 % (ref 0.0–0.2)

## 2020-06-02 LAB — CMP (CANCER CENTER ONLY)
ALT: 26 U/L (ref 0–44)
AST: 22 U/L (ref 15–41)
Albumin: 4 g/dL (ref 3.5–5.0)
Alkaline Phosphatase: 47 U/L (ref 38–126)
Anion gap: 8 (ref 5–15)
BUN: 15 mg/dL (ref 8–23)
CO2: 28 mmol/L (ref 22–32)
Calcium: 9.6 mg/dL (ref 8.9–10.3)
Chloride: 102 mmol/L (ref 98–111)
Creatinine: 1.26 mg/dL — ABNORMAL HIGH (ref 0.61–1.24)
GFR, Est AFR Am: >60 mL/min (ref >60)
GFR, Estimated: 57 mL/min — ABNORMAL LOW (ref >60)
Glucose, Bld: 95 mg/dL (ref 70–99)
Potassium: 4.6 mmol/L (ref 3.5–5.1)
Sodium: 138 mmol/L (ref 135–145)
Total Bilirubin: 0.6 mg/dL (ref 0.3–1.2)
Total Protein: 7.8 g/dL (ref 6.5–8.1)

## 2020-06-02 MED ORDER — IOHEXOL 300 MG/ML  SOLN
75.0000 mL | Freq: Once | INTRAMUSCULAR | Status: AC | PRN
  Administered 2020-06-02: 75 mL via INTRAVENOUS

## 2020-06-06 ENCOUNTER — Inpatient Hospital Stay (HOSPITAL_BASED_OUTPATIENT_CLINIC_OR_DEPARTMENT_OTHER): Payer: Medicare Other | Admitting: Internal Medicine

## 2020-06-06 ENCOUNTER — Encounter: Payer: Self-pay | Admitting: Internal Medicine

## 2020-06-06 DIAGNOSIS — C3490 Malignant neoplasm of unspecified part of unspecified bronchus or lung: Secondary | ICD-10-CM

## 2020-06-06 NOTE — Progress Notes (Signed)
[facility] [phone]   [phone]  PROGRESS NOTE FOR TELEMEDICINE VISITS  Doree Albee, MD [place] [address]  I connected with@ on 06/06/20 at 10:45 AM EDT by telephone visit and verified that I am speaking with the correct person using two identifiers.   I discussed the limitations, risks, security and privacy concerns of performing an evaluation and management service by telemedicine and the availability of in-person appointments. I also discussed with the patient that there may be a patient responsible charge related to this service. The patient expressed understanding and agreed to proceed.  Other persons participating in the visit and their role in the encounter:  None  Patient's location:  Home Provider's location: [facility].  DIAGNOSIS: 1)Stage IA (T1b, N0, M0) non-small cell lung cancer, well-differentiated adenocarcinoma presented with left upper lobeS pulmonary nodule diagnosed in September 2017. 2)stage Ia non-small cell lung cancer diagnosed in March 2020.  PRIOR THERAPY: 1)Status post left VATS, left upper lobectomy with mediastinal lymph node dissection under the care of Dr. Roxan Hockey on 08/01/2016. 2)status post right VATS with wedge resection of right upper lobe nodule and bleb resection under the care of Dr. Roxan Hockey on December 10, 2018. 3)Status post bronchoscopy with endobronchial valve replacement in the anterior apical and posterior segments of right upper lobe and superior segment of right lower lobe  CURRENT THERAPY: Observation.  INTERVAL HISTORY: Randall Sullivan 69 y.o. male has a telephone virtual visit with me today for evaluation and discussion of his scan results.  The patient is feeling fine today with no concerning complaints.  He denied having any chest pain, shortness of breath, cough or hemoptysis.  He denied having any fever or chills.  He has no nausea, vomiting,  diarrhea or constipation.  He denied having any headache or visual changes.  He had repeat CT scan of the chest performed recently and we are having the telephone visit for evaluation and discussion of his scan results.  MEDICAL HISTORY: Past Medical History:  Diagnosis Date  . A-fib ([place]) 2017   after last lung surgery patient had a history if Afib documented in hospital   . Allergy   . Arthritis    back & legs ( knees)  . Cancer ([place])    lung  . Complication of anesthesia    agitated upon waking from anesth.   . Emphysema   . Emphysema of lung ([place])   . History of lung cancer   . Hyperlipidemia   . Hypertension   . Oxygen dependent    2L continuous  . Tobacco abuse     ALLERGIES:  is allergic to no known allergies.  MEDICATIONS:  Current Outpatient Medications  Medication Sig Dispense Refill  . acetaminophen (TYLENOL) 500 MG tablet Take 1,000 mg by mouth every 6 (six) hours as needed (for arthritis pain.).     [name] albuterol (PROVENTIL) (2.5 MG/3ML) 0.083% nebulizer solution Take 3 mLs (2.5 mg total) by nebulization every 6 (six) hours as needed for wheezing or shortness of breath. 75 mL 5  . aspirin EC 81 MG tablet Take 81 mg by mouth daily.    . cloNIDine (CATAPRES) 0.1 MG tablet Take 1 tablet (0.1 mg total) by mouth 2 (two) times daily. 60 tablet 5  . diltiazem (CARDIZEM SR) 60 MG 12 hr capsule Take 1 capsule (60 mg total) by mouth every 12 (twelve) hours. 60 capsule 3  . fenofibrate (TRICOR) 145 MG tablet Take 1  tablet (145 mg total) by mouth daily. 90 tablet 1  . fexofenadine (ALLEGRA) 180 MG tablet Take 180 mg by mouth daily as needed for allergies or rhinitis.    . metoprolol succinate (TOPROL-XL) 50 MG 24 hr tablet Take 1 tablet (50 mg total) by mouth daily. Take with or immediately following a meal. 90 tablet 1  . OXYGEN Inhale 2-3 L into the lungs continuous. Hx of lung ca, copd, and emphysema    . pravastatin (PRAVACHOL) 20 MG tablet Take 1 tablet (20 mg total) by  mouth daily. 90 tablet 1   No current facility-administered medications for this visit.    SURGICAL HISTORY:  Past Surgical History:  Procedure Laterality Date  . HERNIA REPAIR Bilateral 0109   umbilical  . VIDEO ASSISTED THORACOSCOPY Right 12/21/2018   Procedure: VIDEO ASSISTED THORACOSCOPY AND RESECTION OF BLEBS RIGHT LOWER LOBE;  Surgeon: [name], MD;  Location: [place];  Service: Thoracic;  Laterality: Right;  [name] VIDEO ASSISTED THORACOSCOPY (VATS)/ LOBECTOMY Left 08/01/2016   Procedure: VIDEO ASSISTED THORACOSCOPY (VATS)/ LEFT UPPER LOBECTOMY with lymph node sampling and onQ placement;  Surgeon: [name], MD;  Location: [place];  Service: Thoracic;  Laterality: Left;  [name] VIDEO ASSISTED THORACOSCOPY (VATS)/WEDGE RESECTION Right 12/09/2018   Procedure: VIDEO ASSISTED THORACOSCOPY (VATS)/WEDGE RESECTION;  Surgeon: [name], MD;  Location: [place];  Service: Thoracic;  Laterality: Right;  [name] VIDEO BRONCHOSCOPY Bilateral 12/21/2018   Procedure: VIDEO BRONCHOSCOPY;  Surgeon: [name], MD;  Location: [place];  Service: Thoracic;  Laterality: Bilateral;  . VIDEO BRONCHOSCOPY WITH ENDOBRONCHIAL NAVIGATION N/A 07/03/2016   Procedure: VIDEO BRONCHOSCOPY WITH ENDOBRONCHIAL NAVIGATION;  Surgeon: [name], MD;  Location: [place];  Service: Thoracic;  Laterality: N/A;  . VIDEO BRONCHOSCOPY WITH INSERTION OF INTERBRONCHIAL VALVE (IBV) N/A 12/11/2018   Procedure: VIDEO BRONCHOSCOPY WITH INSERTION OF INTERBRONCHIAL VALVE (IBV);  Surgeon: [name], MD;  Location: [facility] OR;  Service: Thoracic;  Laterality: N/A;  . VIDEO BRONCHOSCOPY WITH INSERTION OF INTERBRONCHIAL VALVE (IBV) N/A 02/10/2019   Procedure: VIDEO BRONCHOSCOPY WITH REMOVAL OF INTERBRONCHIAL VALVE (IBV);  Surgeon: [name], MD;  Location[facility];  Service: Thoracic;  Laterality: N/A;    REVIEW OF SYSTEMS:  A comprehensive review of systems was negative.    LABORATORY  DATA: Lab Results  Component Value Date   WBC 7.5 06/02/2020   HGB 14.7 06/02/2020   HCT 46.1 06/02/2020   MCV 85.2 06/02/2020   PLT 254 06/02/2020      Chemistry      Component Value Date/Time   NA 138 06/02/2020 1239   NA 136 04/09/2017 1249   K 4.6 06/02/2020 1239   K 4.6 04/09/2017 1249   CL 102 06/02/2020 1239   CO2 28 06/02/2020 1239   CO2 27 04/09/2017 1249   BUN 15 06/02/2020 1239   BUN 23.9 04/09/2017 1249   CREATININE 1.26 (H) 06/02/2020 1239   CREATININE 1.21 (H) 05/08/2020 1051   CREATININE 1.4 (H) 04/09/2017 1249      Component Value Date/Time   CALCIUM 9.6 06/02/2020 1239   CALCIUM 9.6 04/09/2017 1249   ALKPHOS 47 06/02/2020 1239   ALKPHOS 58 04/09/2017 1249   AST 22 06/02/2020 1239   AST 13 04/09/2017 1249   ALT 26 06/02/2020 1239   ALT 9 04/09/2017 1249   BILITOT 0.6 06/02/2020 1239   BILITOT 1.01 04/09/2017 1249       RADIOGRAPHIC STUDIES: CT Chest W Contrast  Result Date: 06/02/2020 CLINICAL DATA:  History of stage IA left upper lobe lung adenocarcinoma status post left upper lobectomy 08/01/2016. History of stage IA right upper lobe non-small cell lung cancer status post right upper lobe wedge resection 12/10/2018. Interval observation. Restaging. EXAM: CT CHEST WITH CONTRAST TECHNIQUE: Multidetector CT imaging of the chest was performed during intravenous contrast administration. CONTRAST:  59mL OMNIPAQUE IOHEXOL 300 MG/ML  SOLN COMPARISON:  11/29/2019 chest CT. FINDINGS: Cardiovascular: Normal heart size. No significant pericardial effusion/thickening. Left anterior descending and left circumflex coronary atherosclerosis. Atherosclerotic nonaneurysmal thoracic aorta. Normal caliber pulmonary arteries. No central pulmonary emboli. Mediastinum/Nodes: No discrete thyroid nodules. Stable mildly patulous thoracic esophagus. No pathologically enlarged axillary, mediastinal or hilar lymph nodes. Lungs/Pleura: No pneumothorax. No pleural effusion. Moderate  centrilobular and paraseptal emphysema with diffuse bronchial wall thickening. Status post left upper lobectomy and right upper lobe wedge resection. Bandlike soft tissue thickening along the wedge resection suture line in the medial right upper lobe measures up to 1.3 cm thickness (series 5/image 41), previously 1.3 cm using similar measurement technique, unchanged. Irregular solid 0.8 cm peripheral right lower lobe pulmonary nodule (series 5/image 108), previously 0.8 cm, stable. Adjacent tiny 0.3 cm right lower lobe nodule (series 5/image 106) is stable. No acute consolidative airspace disease, lung masses or new significant pulmonary nodules. Upper abdomen: Small hiatal hernia.  Diffuse hepatic steatosis. Musculoskeletal: No aggressive appearing focal osseous lesions. Mild thoracic spondylosis. IMPRESSION: 1. No evidence of local tumor recurrence status post left upper lobectomy and right upper lobe wedge resection. Stable bandlike scarring along the wedge resection suture line in the medial right upper lobe. 2. No evidence of metastatic disease in the chest. Subcentimeter right lower lobe pulmonary nodules are stable. 3. Two-vessel coronary atherosclerosis. 4. Small hiatal hernia. 5. Diffuse hepatic steatosis. 6. Aortic Atherosclerosis (ICD10-I70.0) and Emphysema (ICD10-J43.9). Electronically Signed   By: Ilona Sorrel M.D.   On: 06/02/2020 15:03    ASSESSMENT AND PLAN: This is a very pleasant 69 years old white male with a stage IA non-small cell lung cancer status post left lower lobectomy with lymph node dissection under the care of Dr. Roxan Hockey in November 2017 and has been observation since that time. He was also diagnosed with a stage Ia non-small cell lung cancer in March 2020 and the patient underwent wedge resection of the upper lobe in March 2020. The patient is currently on observation and he is feeling fine today with no concerning complaints. He had repeat CT scan of the chest performed  recently.  I personally and independently reviewed the scans and discussed the results with the patient today. Has a scan showed no concerning findings for disease recurrence or metastasis. I recommended for the patient to continue on observation with repeat CT scan of the chest in 6 months. For the coronary atherosclerosis, he was advised to follow-up with his primary care physician for adjustment of his medication and referral to cardiology if needed. The patient was advised to call immediately if he has any concerning symptoms in the interval. I discussed the assessment and treatment plan with the patient. The patient was provided an opportunity to ask questions and all were answered. The patient agreed with the plan and demonstrated an understanding of the instructions.   The patient was advised to call back or seek an in-person evaluation if the symptoms worsen or if the condition fails to improve as anticipated.  I provided 15 minutes of non face-to-face telephone visit time during this encounter, and >  50% was spent counseling as documented under my assessment & plan.  Eilleen Kempf, MD 06/06/2020 9:10 AM  Disclaimer: This note was dictated with voice recognition software. Similar sounding words can inadvertently be transcribed and may not be corrected upon review.

## 2020-06-26 ENCOUNTER — Telehealth: Payer: Self-pay | Admitting: Internal Medicine

## 2020-06-26 ENCOUNTER — Other Ambulatory Visit (INDEPENDENT_AMBULATORY_CARE_PROVIDER_SITE_OTHER): Payer: Self-pay | Admitting: Internal Medicine

## 2020-06-26 ENCOUNTER — Encounter (INDEPENDENT_AMBULATORY_CARE_PROVIDER_SITE_OTHER): Payer: Self-pay | Admitting: Internal Medicine

## 2020-06-26 DIAGNOSIS — E785 Hyperlipidemia, unspecified: Secondary | ICD-10-CM

## 2020-06-26 MED ORDER — PRAVASTATIN SODIUM 20 MG PO TABS: 20.0000 mg | ORAL_TABLET | Freq: Every day | ORAL | 1 refills | Status: DC

## 2020-06-26 MED ORDER — FENOFIBRATE 145 MG PO TABS: 145.0000 mg | ORAL_TABLET | Freq: Every day | ORAL | 1 refills | Status: DC

## 2020-06-26 NOTE — Telephone Encounter (Signed)
Called and left msg about scheduled appts

## 2020-07-08 ENCOUNTER — Other Ambulatory Visit (INDEPENDENT_AMBULATORY_CARE_PROVIDER_SITE_OTHER): Payer: Self-pay | Admitting: Internal Medicine

## 2020-07-08 DIAGNOSIS — I480 Paroxysmal atrial fibrillation: Secondary | ICD-10-CM

## 2020-07-12 ENCOUNTER — Telehealth (INDEPENDENT_AMBULATORY_CARE_PROVIDER_SITE_OTHER): Payer: Medicare Other | Admitting: Nurse Practitioner

## 2020-07-12 ENCOUNTER — Other Ambulatory Visit: Payer: Self-pay

## 2020-07-12 ENCOUNTER — Encounter (INDEPENDENT_AMBULATORY_CARE_PROVIDER_SITE_OTHER): Payer: Self-pay | Admitting: Nurse Practitioner

## 2020-07-12 VITALS — BP 132/88 | HR 62 | Ht 70.0 in | Wt 219.0 lb

## 2020-07-12 DIAGNOSIS — K5900 Constipation, unspecified: Secondary | ICD-10-CM | POA: Diagnosis not present

## 2020-07-12 MED ORDER — SENNOSIDES-DOCUSATE SODIUM 8.6-50 MG PO TABS: 1.0000 | ORAL_TABLET | Freq: Two times a day (BID) | ORAL | 0 refills | Status: DC | PRN

## 2020-07-12 MED ORDER — MILK OF MAGNESIA 400 MG/5ML PO SUSP: 30.0000 mL | Freq: Every day | ORAL | 0 refills | Status: DC | PRN

## 2020-07-12 NOTE — Progress Notes (Signed)
Due to national recommendations of social distancing related to the [place] pandemic, an audio-only tele-health visit was felt to be the most appropriate encounter type for this patient today. I connected with  Randall Sullivan on 07/12/20 utilizing audio-only technology and verified that I am speaking with the correct person using two identifiers. The patient was located at their home, and I was located at the office of [facility] during the encounter. I discussed the limitations of evaluation and management by telemedicine. The patient expressed understanding and agreed to proceed.      Subjective:  Patient ID: Randall Sullivan, male    DOB: 1951/05/16  Age: 69 y.o. MRN: [id number]  CC:  Chief Complaint  Patient presents with  . Constipation    has not had a bowel movement in 6-7 days, OTC meds are not working Miralax, [employer], suppository      HPI  This patient arrives today for virtual visit for the above.  He tells me he has been unable to have a good bowel movement for the last week.  He tells me he has been taking over-the-counter medications to treat this without much improvement.  He tells me today he did have a small amount of loose stool after using the over-the-counter medications.  He denies any abdominal pain, nausea, vomiting, or distention.  He believes that since he increased his dose of vitamin D to 10,000 IUs daily he has been experiencing some more frequent constipation.  Past Medical History:  Diagnosis Date  . A-fib ([place]) 2017   after last lung surgery patient had a history if Afib documented in hospital   . Allergy   . Arthritis    back & legs ( knees)  . Cancer ([place])    lung  . Complication of anesthesia    agitated upon waking from anesth.   . Emphysema   . Emphysema of lung ([place])   . History of lung cancer   . Hyperlipidemia   . Hypertension   . Oxygen dependent    2L continuous  . Tobacco abuse       Family History  Problem Relation Age  of Onset  . Cancer Mother   . Cancer Sister   . Aneurysm Sister   . Kidney disease Paternal Aunt   . Asthma Brother   . Cancer - Lung Brother   . 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   Social History   Tobacco Use  . Smoking status: Former Smoker    Packs/day: 1.00    Years: 40.00    Pack years: 40.00    Types: Cigarettes    Quit date: 05/28/2016    Years since quitting: 4.1  . Smokeless tobacco: Never Used  Substance Use Topics  . Alcohol use: Not Currently    Comment: quit- 2016     Current Meds  Medication Sig  . acetaminophen (TYLENOL) 500 MG tablet Take 1,000 mg by mouth every 6 (six) hours as needed (for arthritis pain.).   [name] albuterol (PROVENTIL) (2.5 MG/3ML) 0.083% nebulizer solution Take 3 mLs (2.5 mg total) by nebulization every 6 (six) hours as needed for wheezing or shortness of breath.  [name] aspirin EC 81 MG tablet Take 81 mg by mouth daily.  . Cholecalciferol (VITAMIN D3) 125 MCG (5000 UT) CAPS Take 1 capsule by mouth daily.  . cloNIDine (CATAPRES) 0.1 MG tablet Take 1 tablet (  0.1 mg total) by mouth 2 (two) times daily.  [name] diltiazem (CARDIZEM SR) 60 MG 12 hr capsule TAKE 1 CAPSULE (60 MG TOTAL) BY MOUTH EVERY 12 (TWELVE) HOURS.  . fenofibrate (TRICOR) 145 MG tablet Take 1 tablet (145 mg total) by mouth daily.  . fexofenadine (ALLEGRA) 180 MG tablet Take 180 mg by mouth daily as needed for allergies or rhinitis.  . metoprolol succinate (TOPROL-XL) 50 MG 24 hr tablet Take 1 tablet (50 mg total) by mouth daily. Take with or immediately following a meal.  . OXYGEN Inhale 2-3 L into the lungs continuous. Hx of lung ca, copd, and emphysema  . pravastatin (PRAVACHOL) 20 MG tablet Take 1 tablet (20 mg total) by mouth daily.    ROS:  Review of Systems  Gastrointestinal: Positive for blood in stool (hx of hemorrhoids) and constipation. Negative for abdominal  pain, nausea and vomiting.     Objective:   Today's Vitals: BP 132/88   Pulse 62   Ht 5\' 10"  (1.778 m)   Wt 219 lb (99.3 kg)   BMI 31.42 kg/m  Vitals with BMI 07/12/2020 05/08/2020 03/21/2020  Height 5\' 10"  5\' 10"  5\' 10"   Weight 219 lbs 215 lbs 13 oz 225 lbs  BMI 31.42 37.48 27.07  Systolic 867 544 920  Diastolic 88 70 80  Pulse 62 62 88     Physical Exam Comprehensive physical exam not conducted today as office visit was conducted over the phone.  Patient sounded fairly well over the phone, he was alert and oriented, he answered questions appropriately and appeared to have appropriate thought processes.      Assessment and Plan   1. Constipation, unspecified constipation type      Plan: 1.  I did consult with my supervising physician (Dr. Anastasio Champion) about this case.  The patient does not seem to be at high risk for bowel obstruction at this time based on his symptoms.  It was recommended that he try Senokot S, 1 tablet twice a day as needed for constipation.  If after 2 doses he fails to have a bowel movement he can try milk of magnesia.  He will also stop his vitamin D3 supplement for now we will restart it but only take 1 tablet by mouth daily once he is having regular bowel movements again.  We will also order abdominal x-ray to check for any possible masses that can be contributing to the constipation.  He was cautioned that if he experiences any nausea, vomiting, abdominal pain, abdominal distention, etc. that he will have to call 911.  He tells me he understands.   Tests ordered Orders Placed This Encounter  Procedures  . DG Abd 2 Views      Meds ordered this encounter  Medications  . magnesium hydroxide (MILK OF MAGNESIA) 400 MG/5ML suspension    Sig: Take 30 mLs by mouth daily as needed for mild constipation.    Dispense:  118 mL    Refill:  0    Order Specific Question:   Supervising Provider    Answer:   Anastasio Champion, NIMISH C [[id number]]  . senna-docusate (SENOKOT  S) 8.6-50 MG tablet    Sig: Take 1 tablet by mouth 2 (two) times daily as needed for mild constipation or moderate constipation.    Dispense:  90 tablet    Refill:  0    Order Specific Question:   Supervising Provider    Answer:   Doree Albee [[id number]]    Patient  to follow-up in as scheduled or sooner as needed.  This telephone conversation lasted for 19 minutes and 25 seconds.  Ailene Ards, NP

## 2020-07-13 ENCOUNTER — Other Ambulatory Visit: Payer: Self-pay

## 2020-07-13 ENCOUNTER — Ambulatory Visit (HOSPITAL_COMMUNITY)
Admission: RE | Admit: 2020-07-13 | Discharge: 2020-07-13 | Disposition: A | Discharge status: Home / Self Care | Payer: Medicare Other | Source: Ambulatory Visit | Attending: Nurse Practitioner | Admitting: Nurse Practitioner

## 2020-07-13 DIAGNOSIS — K5900 Constipation, unspecified: Secondary | ICD-10-CM | POA: Insufficient documentation

## 2020-07-21 ENCOUNTER — Encounter (INDEPENDENT_AMBULATORY_CARE_PROVIDER_SITE_OTHER): Payer: Self-pay | Admitting: Internal Medicine

## 2020-07-24 ENCOUNTER — Other Ambulatory Visit (INDEPENDENT_AMBULATORY_CARE_PROVIDER_SITE_OTHER): Payer: Self-pay | Admitting: Internal Medicine

## 2020-07-24 DIAGNOSIS — C3492 Malignant neoplasm of unspecified part of left bronchus or lung: Secondary | ICD-10-CM

## 2020-07-24 MED ORDER — ALBUTEROL SULFATE (2.5 MG/3ML) 0.083% IN NEBU: 2.5000 mg | INHALATION_SOLUTION | Freq: Four times a day (QID) | RESPIRATORY_TRACT | 5 refills | Status: DC | PRN

## 2020-08-08 ENCOUNTER — Other Ambulatory Visit: Payer: Self-pay

## 2020-08-08 ENCOUNTER — Ambulatory Visit (INDEPENDENT_AMBULATORY_CARE_PROVIDER_SITE_OTHER): Payer: Medicare Other | Admitting: Internal Medicine

## 2020-08-08 ENCOUNTER — Encounter (INDEPENDENT_AMBULATORY_CARE_PROVIDER_SITE_OTHER): Payer: Self-pay | Admitting: Internal Medicine

## 2020-08-08 VITALS — BP 160/90 | HR 70 | Temp 97.3°F | Resp 18 | Ht 70.0 in | Wt 224.0 lb

## 2020-08-08 DIAGNOSIS — E559 Vitamin D deficiency, unspecified: Secondary | ICD-10-CM

## 2020-08-08 DIAGNOSIS — R6882 Decreased libido: Secondary | ICD-10-CM

## 2020-08-08 DIAGNOSIS — I2510 Atherosclerotic heart disease of native coronary artery without angina pectoris: Secondary | ICD-10-CM

## 2020-08-08 DIAGNOSIS — I10 Essential (primary) hypertension: Secondary | ICD-10-CM | POA: Diagnosis not present

## 2020-08-08 DIAGNOSIS — E785 Hyperlipidemia, unspecified: Secondary | ICD-10-CM | POA: Diagnosis not present

## 2020-08-08 MED ORDER — DILTIAZEM HCL ER 120 MG PO CP12: 120.0000 mg | ORAL_CAPSULE | Freq: Two times a day (BID) | ORAL | 3 refills | Status: DC

## 2020-08-08 NOTE — Progress Notes (Signed)
Metrics: Intervention Frequency ACO  Documented Smoking Status Yearly  Screened one or more times in 24 months  Cessation Counseling or  Active cessation medication Past 24 months  Past 24 months   Guideline developer: UpToDate (See UpToDate for funding source) Date Released: 2014       Wellness Office Visit  Subjective:  Patient ID: Randall Sullivan, male    DOB: April 08, 1951  Age: 69 y.o. MRN: [id number]  CC: This man comes in for follow-up of hypertension, hyperlipidemia, vitamin D deficiency. HPI He follow-up with his oncologist in September and a CT scan did show the possibility of two-vessel coronary artery disease.  He denies any symptoms of coronary artery disease at the present time with no evidence of chest pain, chest tightness, dyspnea on exertion etc. He continues to take statin therapy and fenofibrate and he does not really want to take them. He notices the clonidine makes him feel unwell with mood changes apparently. On closer questioning, he also describes  decreased libido.  Past Medical History:  Diagnosis Date  . A-fib ([place]) 2017   after last lung surgery patient had a history if Afib documented in hospital   . Allergy   . Arthritis    back & legs ( knees)  . Cancer ([place])    lung  . Complication of anesthesia    agitated upon waking from anesth.   . Emphysema   . Emphysema of lung ([place])   . History of lung cancer   . Hyperlipidemia   . Hypertension   . Oxygen dependent    2L continuous  . Tobacco abuse    Past Surgical History:  Procedure Laterality Date  . HERNIA REPAIR Bilateral 2440   umbilical  . VIDEO ASSISTED THORACOSCOPY Right 12/21/2018   Procedure: VIDEO ASSISTED THORACOSCOPY AND RESECTION OF BLEBS RIGHT LOWER LOBE;  Surgeon: [name], MD;  Location: [place];  Service: Thoracic;  Laterality: Right;  [name] VIDEO ASSISTED THORACOSCOPY (VATS)/ LOBECTOMY Left 08/01/2016   Procedure: VIDEO ASSISTED THORACOSCOPY (VATS)/ LEFT UPPER LOBECTOMY with  lymph node sampling and onQ placement;  Surgeon: [name], MD;  Location: [place];  Service: Thoracic;  Laterality: Left;  [name] VIDEO ASSISTED THORACOSCOPY (VATS)/WEDGE RESECTION Right 12/09/2018   Procedure: VIDEO ASSISTED THORACOSCOPY (VATS)/WEDGE RESECTION;  Surgeon: [name], MD;  Location: [place];  Service: Thoracic;  Laterality: Right;  [name] VIDEO BRONCHOSCOPY Bilateral 12/21/2018   Procedure: VIDEO BRONCHOSCOPY;  Surgeon: [name], MD;  Location: [place];  Service: Thoracic;  Laterality: Bilateral;  . VIDEO BRONCHOSCOPY WITH ENDOBRONCHIAL NAVIGATION N/A 07/03/2016   Procedure: VIDEO BRONCHOSCOPY WITH ENDOBRONCHIAL NAVIGATION;  Surgeon: [name], MD;  Location: [place];  Service: Thoracic;  Laterality: N/A;  . VIDEO BRONCHOSCOPY WITH INSERTION OF INTERBRONCHIAL VALVE (IBV) N/A 12/11/2018   Procedure: VIDEO BRONCHOSCOPY WITH INSERTION OF INTERBRONCHIAL VALVE (IBV);  Surgeon: [name], MD;  [facility];  Service: Thoracic;  Laterality: N/A;  . VIDEO BRONCHOSCOPY WITH INSERTION OF INTERBRONCHIAL VALVE (IBV) N/A 02/10/2019   Procedure: VIDEO BRONCHOSCOPY WITH REMOVAL OF INTERBRONCHIAL VALVE (IBV);  Surgeon: [name], MD;  Location: [facility] OR;  Service: Thoracic;  Laterality: N/A;     Family History  Problem Relation Age of Onset  . Cancer Mother   . Cancer Sister   . Aneurysm Sister   . Kidney disease Paternal Aunt   . Asthma Brother   . Cancer - Lung Brother   . 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   Social History   Tobacco Use  . Smoking status: Former Smoker    Packs/day: 1.00    Years: 40.00    Pack years: 40.00    Types: Cigarettes    Quit date: 05/28/2016    Years since quitting: 4.2  . Smokeless tobacco: Never Used  Substance Use Topics  . Alcohol use: Not Currently    Comment: quit- 2016    Current  Meds  Medication Sig  . acetaminophen (TYLENOL) 500 MG tablet Take 1,000 mg by mouth every 6 (six) hours as needed (for arthritis pain.).   [name] albuterol (PROVENTIL) (2.5 MG/3ML) 0.083% nebulizer solution Take 3 mLs (2.5 mg total) by nebulization every 6 (six) hours as needed for wheezing or shortness of breath.  [name] aspirin EC 81 MG tablet Take 81 mg by mouth daily.  . Cholecalciferol (VITAMIN D3) 125 MCG (5000 UT) CAPS Take 1 capsule by mouth daily.  . cloNIDine (CATAPRES) 0.1 MG tablet Take 1 tablet (0.1 mg total) by mouth 2 (two) times daily.  [name] diltiazem (CARDIZEM SR) 60 MG 12 hr capsule TAKE 1 CAPSULE (60 MG TOTAL) BY MOUTH EVERY 12 (TWELVE) HOURS.  . fenofibrate (TRICOR) 145 MG tablet Take 1 tablet (145 mg total) by mouth daily.  . fexofenadine (ALLEGRA) 180 MG tablet Take 180 mg by mouth daily as needed for allergies or rhinitis.  . magnesium hydroxide (MILK OF MAGNESIA) 400 MG/5ML suspension Take 30 mLs by mouth daily as needed for mild constipation.  . metoprolol succinate (TOPROL-XL) 50 MG 24 hr tablet Take 1 tablet (50 mg total) by mouth daily. Take with or immediately following a meal.  . OXYGEN Inhale 2-3 L into the lungs continuous. Hx of lung ca, copd, and emphysema  . pravastatin (PRAVACHOL) 20 MG tablet Take 1 tablet (20 mg total) by mouth daily.  [name] senna-docusate (SENOKOT S) 8.6-50 MG tablet Take 1 tablet by mouth 2 (two) times daily as needed for mild constipation or moderate constipation.      Depression screen [facility] 2/9 12/16/2019 11/17/2019 03/27/2017 02/08/2017 12/31/2016  Decreased Interest 0 0 0 0 0  Down, Depressed, Hopeless 0 0 0 0 0  PHQ - 2 Score 0 0 0 0 0  Some recent data might be hidden     Objective:   Today's Vitals: BP (!) 160/90 (BP Location: Left Arm, Patient Position: Sitting, Cuff Size: Normal) Comment: 148/90 rt arm.  Pulse 70   Temp (!) 97.3 F (36.3 C) (Temporal)   Resp 18   Ht 5\' 10"  (1.778 m)   Wt 224 lb (101.6 kg)   SpO2 96%   BMI 32.14 kg/m    Vitals with BMI 08/08/2020 07/12/2020 05/08/2020  Height 5\' 10"  5\' 10"  5\' 10"   Weight 224 lbs 219 lbs 215 lbs 13 oz  BMI 32.14 16.10 96.04  Systolic 540 981 191  Diastolic 90 88 70  Pulse 70 62 62     Physical Exam His blood pressure today is elevated.  He has gained some weight also.  Alert and orientated without any focal neurological signs.      Assessment   1. Coronary artery disease involving native heart without angina pectoris, unspecified vessel or lesion type   2. Hyperlipidemia, unspecified hyperlipidemia type   3. Essential (primary) hypertension   4. Vitamin D deficiency disease   5. Decreased libido       Tests ordered  Orders Placed This Encounter  Procedures  . Testosterone Total,Free,Bio, Males  . Ambulatory referral to Cardiology     Plan: 1. I will refer him to cardiology for evaluation of possibility of coronary artery disease although he does not have symptoms. 2. For the time being he will continue with statin therapy and fenofibrate. 3. I have recommended he discontinue clonidine and we will increase the diltiazem to a dose of 120 mg twice a day.  I have sent a new prescription to reflect this. 4. I will check testosterone levels. 5. I will see him in about 6 weeks time to see how he is doing and by that time he should have seen the cardiologist. 6. Also recommended COVID-19 booster now.   Meds ordered this encounter  Medications  . diltiazem (CARDIZEM SR) 120 MG 12 hr capsule    Sig: Take 1 capsule (120 mg total) by mouth 2 (two) times daily. New dose    Dispense:  60 capsule    Refill:  3    Scotty Pinder Luther Parody, MD

## 2020-08-09 LAB — TESTOSTERONE TOTAL,FREE,BIO, MALES
Albumin: 4.6 g/dL (ref 3.6–5.1)
Sex Hormone Binding: 37 nmol/L (ref 22–77)
Testosterone, Bioavailable: 83.2 ng/dL (ref 15.0–150.0)
Testosterone, Free: 39.6 pg/mL (ref 6.0–73.0)
Testosterone: 341 ng/dL (ref 250–827)

## 2020-08-30 ENCOUNTER — Encounter: Payer: Self-pay | Admitting: Cardiology

## 2020-08-30 DIAGNOSIS — I700 Atherosclerosis of aorta: Secondary | ICD-10-CM | POA: Insufficient documentation

## 2020-08-30 DIAGNOSIS — R931 Abnormal findings on diagnostic imaging of heart and coronary circulation: Secondary | ICD-10-CM | POA: Insufficient documentation

## 2020-08-30 NOTE — Progress Notes (Signed)
Cardiology Office Note   Date:  09/01/2020   ID:  Randall Sullivan, DOB 1951/08/16, MRN [id number]  PCP:  Doree Albee, MD  Cardiologist:   Minus Breeding, MD Referring:  Doree Albee, MD  Chief Complaint  Patient presents with  . Elevated Coronary Calcium      History of Present Illness: Randall Sullivan is a 69 y.o. male who presents for evaluation of coronary calcium .  He is referred by Doree Albee, MD    He was recently found on a CT of his chest to have 2 vessel coronary calcium.  This was done to follow up resection of a lung cancer.  I saw him in 2017.  He had resection of lung cancer then as well and had some atrial fibrillation post surgery.  He had had resection of another lung cancer this time on the other side (right) last year.  I reviewed the records from the hospitalization and with a complicated course.  He had a VATS for blebectomy and wedge resection of the right upper lobe nodule.  This was a non-small cell lung cancer.  He had a large air leak postoperatively and had to have redo VATS.  He reports being in atrial fibrillation transiently but I do not see a discharge summary that corroborates this.  He presents now for follow-up of the coronary calcium.  He actually has done quite well.  He has not smoked since his first resection.  He has never felt any palpitations, presyncope or syncope.  He denies any chest pressure, neck or arm discomfort.  He has had no weight gain or edema.  He does not do a lot of activity though he walks around his yard and does some grocery shopping.   Past Medical History:  Diagnosis Date  . A-fib ([place]) 2017   after last lung surgery patient had a history if Afib documented in hospital   . Allergy   . Arthritis    back & legs ( knees)  . Cancer ([place])    lung  . Complication of anesthesia    agitated upon waking from anesth.   . Emphysema   . History of lung cancer   . Hyperlipidemia   . Hypertension   . Oxygen dependent     2L continuous  . Tobacco abuse     Past Surgical History:  Procedure Laterality Date  . HERNIA REPAIR Bilateral 1696   umbilical  . VIDEO ASSISTED THORACOSCOPY Right 12/21/2018   Procedure: VIDEO ASSISTED THORACOSCOPY AND RESECTION OF BLEBS RIGHT LOWER LOBE;  Surgeon: [name], MD;  Location: [place];  Service: Thoracic;  Laterality: Right;  [name] VIDEO ASSISTED THORACOSCOPY (VATS)/ LOBECTOMY Left 08/01/2016   Procedure: VIDEO ASSISTED THORACOSCOPY (VATS)/ LEFT UPPER LOBECTOMY with lymph node sampling and onQ placement;  Surgeon: [name], MD;  Location: [place];  Service: Thoracic;  Laterality: Left;  [name] VIDEO ASSISTED THORACOSCOPY (VATS)/WEDGE RESECTION Right 12/09/2018   Procedure: VIDEO ASSISTED THORACOSCOPY (VATS)/WEDGE RESECTION;  Surgeon: [name], MD;  Location: [place];  Service: Thoracic;  Laterality: Right;  [name] VIDEO BRONCHOSCOPY Bilateral 12/21/2018   Procedure: VIDEO BRONCHOSCOPY;  Surgeon: [name], MD;  Location: [place];  Service: Thoracic;  Laterality: Bilateral;  . VIDEO BRONCHOSCOPY WITH ENDOBRONCHIAL NAVIGATION N/A 07/03/2016   Procedure: VIDEO BRONCHOSCOPY WITH ENDOBRONCHIAL NAVIGATION;  Surgeon: [name], MD;  Location: [place];  Service: Thoracic;  Laterality: N/A;  . VIDEO BRONCHOSCOPY WITH INSERTION OF INTERBRONCHIAL  VALVE (IBV) N/A 12/11/2018   Procedure: VIDEO BRONCHOSCOPY WITH INSERTION OF INTERBRONCHIAL VALVE (IBV);  Surgeon: [name], MD;  Location: [facility] OR;  Service: Thoracic;  Laterality: N/A;  . VIDEO BRONCHOSCOPY WITH INSERTION OF INTERBRONCHIAL VALVE (IBV) N/A 02/10/2019   Procedure: VIDEO BRONCHOSCOPY WITH REMOVAL OF INTERBRONCHIAL VALVE (IBV);  Surgeon: [name], MD;  Location[facility];  Service: Thoracic;  Laterality: N/A;     Current Outpatient Medications  Medication Sig Dispense Refill  . acetaminophen (TYLENOL) 500 MG tablet Take 1,000 mg by mouth every 6 (six) hours as needed (for  arthritis pain.).     [name] albuterol (PROVENTIL) (2.5 MG/3ML) 0.083% nebulizer solution Take 3 mLs (2.5 mg total) by nebulization every 6 (six) hours as needed for wheezing or shortness of breath. 75 mL 5  . aspirin EC 81 MG tablet Take 81 mg by mouth daily.    . Cholecalciferol (VITAMIN D3) 125 MCG (5000 UT) CAPS Take 1 capsule by mouth daily.    [name] diltiazem (CARDIZEM SR) 120 MG 12 hr capsule Take 1 capsule (120 mg total) by mouth 2 (two) times daily. New dose 60 capsule 3  . fenofibrate (TRICOR) 145 MG tablet Take 1 tablet (145 mg total) by mouth daily. 90 tablet 1  . fexofenadine (ALLEGRA) 180 MG tablet Take 180 mg by mouth daily as needed for allergies or rhinitis.    . magnesium hydroxide (MILK OF MAGNESIA) 400 MG/5ML suspension Take 30 mLs by mouth daily as needed for mild constipation. 118 mL 0  . metoprolol succinate (TOPROL-XL) 50 MG 24 hr tablet Take 1 tablet (50 mg total) by mouth daily. Take with or immediately following a meal. 90 tablet 1  . OXYGEN Inhale 2-3 L into the lungs continuous. Hx of lung ca, copd, and emphysema    . pravastatin (PRAVACHOL) 20 MG tablet Take 1 tablet (20 mg total) by mouth daily. 90 tablet 1  . senna-docusate (SENOKOT S) 8.6-50 MG tablet Take 1 tablet by mouth 2 (two) times daily as needed for mild constipation or moderate constipation. 90 tablet 0  . chlorthalidone (HYGROTON) 25 MG tablet Take 1 tablet (25 mg total) by mouth daily. 90 tablet 3   No current facility-administered medications for this visit.    Allergies:   No known allergies    Social History:  The patient  reports that he quit smoking about 4 years ago. His smoking use included cigarettes. He has a 40.00 pack-year smoking history. He has never used smokeless tobacco. He reports previous alcohol use. He reports that he does not use drugs.   Family History:  The patient's family history includes Aneurysm in his sister; Asthma in his brother; Cancer in his brother, mother, and sister; Cancer -  Lung in his brother; Kidney disease in his paternal aunt.    ROS:  Please see the history of present illness.   Otherwise, review of systems are positive for none.   All other systems are reviewed and negative.    PHYSICAL EXAM: VS:  BP (!) 156/90   Pulse 62   Ht 5\' 10"  (1.778 m)   Wt 226 lb 9.6 oz (102.8 kg)   SpO2 97%   BMI 32.51 kg/m  , BMI Body mass index is 32.51 kg/m. GENERAL:  Well appearing HEENT:  Pupils equal round and reactive, fundi not visualized, oral mucosa unremarkable NECK:  No jugular venous distention, waveform within normal limits, carotid upstroke brisk and symmetric, no bruits, no thyromegaly LYMPHATICS:  No  cervical, inguinal adenopathy LUNGS:  Clear to auscultation bilaterally BACK:  No CVA tenderness CHEST: Well-healed thoracotomy scars HEART:  PMI not displaced or sustained,S1 and S2 within normal limits, no S3, no S4, no clicks, no rubs, no murmurs ABD:  Flat, positive bowel sounds normal in frequency in pitch, no bruits, no rebound, no guarding, no midline pulsatile mass, no hepatomegaly, no splenomegaly EXT:  2 plus pulses throughout, no edema, no cyanosis no clubbing SKIN:  No rashes no nodules NEURO:  Cranial nerves II through XII grossly intact, motor grossly intact throughout PSYCH:  Cognitively intact, oriented to person place and time    EKG:  EKG is ordered today. The ekg ordered today demonstrates sinus rhythm, rate 59, axis within normal limits, intervals within normal limits, no acute ST-T wave changes.   Recent Labs: 05/08/2020: TSH 0.82 06/02/2020: ALT 26; BUN 15; Creatinine 1.26; Hemoglobin 14.7; Platelet Count 254; Potassium 4.6; Sodium 138    Lipid Panel    Component Value Date/Time   CHOL 200 (H) 05/08/2020 1051   CHOL 205 (H) 11/25/2019 0939   TRIG 465 (H) 05/08/2020 1051   HDL 31 (L) 05/08/2020 1051   HDL 30 (L) 11/25/2019 0939   CHOLHDL 6.5 (H) 05/08/2020 1051   [place]  05/08/2020 1051     Comment:     . LDL cholesterol  not calculated. Triglyceride levels greater than 400 mg/dL invalidate calculated LDL results. . Reference range: <100 . Desirable range <100 mg/dL for primary prevention;   <70 mg/dL for patients with CHD or diabetic patients  with > or = 2 CHD risk factors. [name] LDL-C is now calculated using the Martin-Hopkins  calculation, which is a validated novel method providing  better accuracy than the Friedewald equation in the  estimation of LDL-C.  Cresenciano Genre et al. Annamaria Helling. 4462;863(81): 2061-2068  (http://education.[employer].com/faq/FAQ164)       Wt Readings from Last 3 Encounters:  09/01/20 226 lb 9.6 oz (102.8 kg)  08/08/20 224 lb (101.6 kg)  07/12/20 219 lb (99.3 kg)      Other studies Reviewed: Additional studies/ records that were reviewed today include:  CT scan, extensive review of hospital records from 2020. Review of the above records demonstrates:  Please see elsewhere in the note.     ASSESSMENT AND PLAN:  ELEVATED CORONARY CALCIUM: Given this I would like to screen him with a POET (Plain Old Exercise Treadmill).  We will also continue with aggressive risk reduction.  HTN: His blood pressure is elevated.  I am going to add chlorthalidone 25 mg daily.  DYSLIPIDEMIA: His LDL was excellent.  No change in therapy.  AORTIC ATHEROSCLEROSIS: He will continue with aggressive risk reduction.  ATRIAL FIB: This seems to have been postoperative both times and is not having any symptomatic recurrence.  No anticoagulation is indicated.    Current medicines are reviewed at length with the patient today.  The patient does not have concerns regarding medicines.  The following changes have been made: As above  Labs/ tests ordered today include:   Orders Placed This Encounter  Procedures  . EXERCISE TOLERANCE TEST (ETT)  . EKG 12-Lead     Disposition:   FU with me in 1 year   Signed, Minus Breeding, MD  09/01/2020 12:22 PM    [facility]

## 2020-09-01 ENCOUNTER — Encounter: Payer: Self-pay | Admitting: Cardiology

## 2020-09-01 ENCOUNTER — Encounter: Payer: Self-pay | Admitting: *Deleted

## 2020-09-01 ENCOUNTER — Ambulatory Visit (INDEPENDENT_AMBULATORY_CARE_PROVIDER_SITE_OTHER): Payer: Medicare Other | Admitting: Cardiology

## 2020-09-01 VITALS — BP 156/90 | HR 62 | Ht 70.0 in | Wt 226.6 lb

## 2020-09-01 DIAGNOSIS — R931 Abnormal findings on diagnostic imaging of heart and coronary circulation: Secondary | ICD-10-CM

## 2020-09-01 DIAGNOSIS — I700 Atherosclerosis of aorta: Secondary | ICD-10-CM

## 2020-09-01 DIAGNOSIS — E785 Hyperlipidemia, unspecified: Secondary | ICD-10-CM | POA: Diagnosis not present

## 2020-09-01 DIAGNOSIS — I10 Essential (primary) hypertension: Secondary | ICD-10-CM

## 2020-09-01 MED ORDER — CHLORTHALIDONE 25 MG PO TABS: 25.0000 mg | ORAL_TABLET | Freq: Every day | ORAL | 3 refills | Status: DC

## 2020-09-01 NOTE — Patient Instructions (Addendum)
Medication Instructions:   Your physician has recommended you make the following change in your medication:   Start chlorthalidone 25 mg by mouth daily  Continue other medication  Labwork:  Covid test 2-3 days before stress test  Testing/Procedures: Your physician has requested that you have an exercise tolerance test. For further information please visit [email or web address]. Please also follow instruction sheet, as given.  Follow-Up:  Your physician recommends that you schedule a follow-up appointment in: 1 year in [place]. You will receive a reminder letter in the mail in about 10 months reminding you to call and schedule your appointment. If you don't receive this letter, please contact our office.  Any Other Special Instructions Will Be Listed Below (If Applicable).  If you need a refill on your cardiac medications before your next appointment, please call your pharmacy.

## 2020-09-07 ENCOUNTER — Telehealth (INDEPENDENT_AMBULATORY_CARE_PROVIDER_SITE_OTHER): Payer: Self-pay

## 2020-09-07 ENCOUNTER — Other Ambulatory Visit (INDEPENDENT_AMBULATORY_CARE_PROVIDER_SITE_OTHER): Payer: Self-pay

## 2020-09-07 NOTE — Telephone Encounter (Signed)
Pt called to f/u on the cologaurd test kit to be sent to home. I  Will lookup in system the faxed  Copy and contact the rep to see where it may be att this time.

## 2020-09-10 ENCOUNTER — Other Ambulatory Visit: Payer: Self-pay | Admitting: Family Medicine

## 2020-09-10 DIAGNOSIS — I480 Paroxysmal atrial fibrillation: Secondary | ICD-10-CM

## 2020-09-15 ENCOUNTER — Encounter (INDEPENDENT_AMBULATORY_CARE_PROVIDER_SITE_OTHER): Payer: Self-pay | Admitting: Internal Medicine

## 2020-09-15 MED ORDER — METOPROLOL SUCCINATE ER 50 MG PO TB24
50.0000 mg | ORAL_TABLET | Freq: Every day | ORAL | 1 refills | Status: DC
Start: 2020-09-15 — End: 2021-03-22

## 2020-09-19 ENCOUNTER — Ambulatory Visit (INDEPENDENT_AMBULATORY_CARE_PROVIDER_SITE_OTHER): Payer: Medicare Other | Admitting: Internal Medicine

## 2020-09-19 ENCOUNTER — Other Ambulatory Visit (INDEPENDENT_AMBULATORY_CARE_PROVIDER_SITE_OTHER): Payer: Self-pay | Admitting: Internal Medicine

## 2020-09-19 ENCOUNTER — Encounter (INDEPENDENT_AMBULATORY_CARE_PROVIDER_SITE_OTHER): Payer: Self-pay | Admitting: Internal Medicine

## 2020-09-19 DIAGNOSIS — C3492 Malignant neoplasm of unspecified part of left bronchus or lung: Secondary | ICD-10-CM

## 2020-09-19 MED ORDER — ALBUTEROL SULFATE (2.5 MG/3ML) 0.083% IN NEBU: 2.5000 mg | INHALATION_SOLUTION | Freq: Four times a day (QID) | RESPIRATORY_TRACT | 5 refills | Status: DC | PRN

## 2020-09-26 ENCOUNTER — Other Ambulatory Visit (HOSPITAL_COMMUNITY): Payer: Medicare Other

## 2020-09-27 ENCOUNTER — Encounter (HOSPITAL_COMMUNITY): Payer: Medicare Other

## 2020-09-30 DIAGNOSIS — I639 Cerebral infarction, unspecified: Secondary | ICD-10-CM

## 2020-09-30 HISTORY — DX: Cerebral infarction, unspecified: I63.9

## 2020-10-26 ENCOUNTER — Telehealth (INDEPENDENT_AMBULATORY_CARE_PROVIDER_SITE_OTHER): Payer: Medicare Other | Admitting: Internal Medicine

## 2020-10-26 ENCOUNTER — Encounter (INDEPENDENT_AMBULATORY_CARE_PROVIDER_SITE_OTHER): Payer: Self-pay | Admitting: Internal Medicine

## 2020-10-26 VITALS — BP 135/98 | HR 70 | Ht 70.0 in

## 2020-10-26 DIAGNOSIS — I2510 Atherosclerotic heart disease of native coronary artery without angina pectoris: Secondary | ICD-10-CM | POA: Diagnosis not present

## 2020-10-26 DIAGNOSIS — R6882 Decreased libido: Secondary | ICD-10-CM

## 2020-10-26 NOTE — Progress Notes (Signed)
Metrics: Intervention Frequency ACO  Documented Smoking Status Yearly  Screened one or more times in 24 months  Cessation Counseling or  Active cessation medication Past 24 months  Past 24 months   Guideline developer: UpToDate (See UpToDate for funding source) Date Released: 2014       Wellness Office Visit  Subjective:  Patient ID: Randall Sullivan, male    DOB: December 13, 1950  Age: 70 y.o. MRN: [id number]  CC: This is an audio telemedicine with the permission of the patient who is at home and I am in my office. The patient was unable to come to the office because of transport issues. I used 2 identifiers to identify the patient. HPI  This visit was meant to follow-up on his blood work and to review his visit with the cardiologist. The cardiologist feels that he is probably at low risk but is going to have him have a regular exercise test on the treadmill. He will have this in the next couple of weeks. I discussed his testosterone levels with him which are normal. Past Medical History:  Diagnosis Date  . A-fib ([place]) 2017   after last lung surgery patient had a history if Afib documented in hospital   . Allergy   . Arthritis    back & legs ( knees)  . Cancer ([place])    lung  . Complication of anesthesia    agitated upon waking from anesth.   . Emphysema   . History of lung cancer   . Hyperlipidemia   . Hypertension   . Oxygen dependent    2L continuous  . Tobacco abuse    Past Surgical History:  Procedure Laterality Date  . HERNIA REPAIR Bilateral 4709   umbilical  . VIDEO ASSISTED THORACOSCOPY Right 12/21/2018   Procedure: VIDEO ASSISTED THORACOSCOPY AND RESECTION OF BLEBS RIGHT LOWER LOBE;  Surgeon: [name], MD;  Location: [place];  Service: Thoracic;  Laterality: Right;  [name] VIDEO ASSISTED THORACOSCOPY (VATS)/ LOBECTOMY Left 08/01/2016   Procedure: VIDEO ASSISTED THORACOSCOPY (VATS)/ LEFT UPPER LOBECTOMY with lymph node sampling and onQ placement;  Surgeon: [name], MD;  Location: [place];  Service: Thoracic;  Laterality: Left;  [name] VIDEO ASSISTED THORACOSCOPY (VATS)/WEDGE RESECTION Right 12/09/2018   Procedure: VIDEO ASSISTED THORACOSCOPY (VATS)/WEDGE RESECTION;  Surgeon: [name], MD;  Location: [place];  Service: Thoracic;  Laterality: Right;  [name] VIDEO BRONCHOSCOPY Bilateral 12/21/2018   Procedure: VIDEO BRONCHOSCOPY;  Surgeon: [name], MD;  Location: [place];  Service: Thoracic;  Laterality: Bilateral;  . VIDEO BRONCHOSCOPY WITH ENDOBRONCHIAL NAVIGATION N/A 07/03/2016   Procedure: VIDEO BRONCHOSCOPY WITH ENDOBRONCHIAL NAVIGATION;  Surgeon: [name], MD;  Location: [place];  Service: Thoracic;  Laterality: N/A;  . VIDEO BRONCHOSCOPY WITH INSERTION OF INTERBRONCHIAL VALVE (IBV) N/A 12/11/2018   Procedure: VIDEO BRONCHOSCOPY WITH INSERTION OF INTERBRONCHIAL VALVE (IBV);  Surgeon: [name], MD;  [facility];  Service: Thoracic;  Laterality: N/A;  . VIDEO BRONCHOSCOPY WITH INSERTION OF INTERBRONCHIAL VALVE (IBV) N/A 02/10/2019   Procedure: VIDEO BRONCHOSCOPY WITH REMOVAL OF INTERBRONCHIAL VALVE (IBV);  Surgeon: [name], MD;  Location: [facility] OR;  Service: Thoracic;  Laterality: N/A;     Family History  Problem Relation Age of Onset  . Cancer Mother   . Cancer Sister   . Aneurysm Sister   . Kidney disease Paternal Aunt   . Asthma Brother   . Cancer - Lung Brother   . 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   Social History   Tobacco Use  . Smoking status: Former Smoker    Packs/day: 1.00    Years: 40.00    Pack years: 40.00    Types: Cigarettes    Quit date: 05/28/2016    Years since quitting: 4.4  . Smokeless tobacco: Never Used  Substance Use Topics  . Alcohol use: Not Currently    Comment: quit- 2016    Current Meds  Medication Sig  . acetaminophen (TYLENOL) 500 MG  tablet Take 1,000 mg by mouth every 6 (six) hours as needed (for arthritis pain.).   [name] albuterol (PROVENTIL) (2.5 MG/3ML) 0.083% nebulizer solution Take 3 mLs (2.5 mg total) by nebulization every 6 (six) hours as needed for wheezing or shortness of breath.  [name] aspirin EC 81 MG tablet Take 81 mg by mouth daily.  . chlorthalidone (HYGROTON) 25 MG tablet Take 1 tablet (25 mg total) by mouth daily.  . Cholecalciferol (VITAMIN D3) 125 MCG (5000 UT) CAPS Take 1 capsule by mouth daily.  [name] diltiazem (CARDIZEM SR) 120 MG 12 hr capsule Take 1 capsule (120 mg total) by mouth 2 (two) times daily. New dose  . fenofibrate (TRICOR) 145 MG tablet Take 1 tablet (145 mg total) by mouth daily.  . fexofenadine (ALLEGRA) 180 MG tablet Take 180 mg by mouth daily as needed for allergies or rhinitis.  . magnesium hydroxide (MILK OF MAGNESIA) 400 MG/5ML suspension Take 30 mLs by mouth daily as needed for mild constipation.  . metoprolol succinate (TOPROL-XL) 50 MG 24 hr tablet Take 1 tablet (50 mg total) by mouth daily. Take with or immediately following a meal.  . OXYGEN Inhale 2-3 L into the lungs continuous. Hx of lung ca, copd, and emphysema  . pravastatin (PRAVACHOL) 20 MG tablet Take 1 tablet (20 mg total) by mouth daily.  [name] senna-docusate (SENOKOT S) 8.6-50 MG tablet Take 1 tablet by mouth 2 (two) times daily as needed for mild constipation or moderate constipation.      Depression screen [facility] 2/9 10/26/2020 12/16/2019 11/17/2019 03/27/2017 02/08/2017  Decreased Interest 0 0 0 0 0  Down, Depressed, Hopeless 0 0 0 0 0  PHQ - 2 Score 0 0 0 0 0  Altered sleeping 0 - - - -  Tired, decreased energy 0 - - - -  Change in appetite 0 - - - -  Feeling bad or failure about yourself  0 - - - -  Trouble concentrating 0 - - - -  Moving slowly or fidgety/restless 0 - - - -  Suicidal thoughts 0 - - - -  PHQ-9 Score 0 - - - -  Difficult doing work/chores Not difficult at all - - - -  Some recent data might be hidden      Objective:   Today's Vitals: BP (!) 135/98   Pulse 70   Ht 5\' 10"  (1.778 m)   BMI 32.51 kg/m  Vitals with BMI 10/26/2020 09/01/2020 08/08/2020  Height 5\' 10"  5\' 10"  5\' 10"   Weight (No Data) 226 lbs 10 oz 224 lbs  BMI - 25.36 64.40  Systolic 347 425 956  Diastolic 98 90 90  Pulse 70 62 70     Physical Exam   Virtual visit. He appears to be alert and orientated on the phone.    Assessment   1. Coronary artery disease involving native heart without angina pectoris, unspecified  vessel or lesion type   2. Decreased libido       Tests ordered No orders of the defined types were placed in this encounter.    Plan: 1. We decided that we should wait for a stress test which is going to be in a couple of weeks before making further recommendations regarding testosterone levels. He did not seem too concerned about this when I told him the numbers and he is not too concerned about the symptoms related to the testosterone. It may not be an issue for him. 2. Follow-up with Judson Roch in about 4 months. 3. This phone call lasted 5 minutes.   No orders of the defined types were placed in this encounter.   Doree Albee, MD

## 2020-10-31 ENCOUNTER — Telehealth: Payer: Self-pay | Admitting: Cardiology

## 2020-10-31 NOTE — Telephone Encounter (Signed)
REQUESTED TO HAVE TEST CANCELLED AT THIS TIME. NO REASON GIVEN DID NOT WANT TO RESCHEDULE -WILL CALL BACK.

## 2020-10-31 NOTE — Telephone Encounter (Signed)
Pt cancelled GXT scheduled at [facility] for further evaluation of elevated Ca score.

## 2020-11-03 ENCOUNTER — Encounter (INDEPENDENT_AMBULATORY_CARE_PROVIDER_SITE_OTHER): Payer: Self-pay | Admitting: Internal Medicine

## 2020-11-03 MED ORDER — DILTIAZEM HCL ER 120 MG PO CP12
120.0000 mg | ORAL_CAPSULE | Freq: Two times a day (BID) | ORAL | 3 refills | Status: DC
Start: 2020-11-03 — End: 2021-03-12

## 2020-11-06 ENCOUNTER — Encounter (INDEPENDENT_AMBULATORY_CARE_PROVIDER_SITE_OTHER): Payer: Self-pay | Admitting: Internal Medicine

## 2020-11-06 ENCOUNTER — Other Ambulatory Visit (INDEPENDENT_AMBULATORY_CARE_PROVIDER_SITE_OTHER): Payer: Self-pay | Admitting: Internal Medicine

## 2020-11-11 ENCOUNTER — Encounter (INDEPENDENT_AMBULATORY_CARE_PROVIDER_SITE_OTHER): Payer: Self-pay | Admitting: Internal Medicine

## 2020-11-11 DIAGNOSIS — K047 Periapical abscess without sinus: Secondary | ICD-10-CM | POA: Diagnosis not present

## 2020-11-11 DIAGNOSIS — R6884 Jaw pain: Secondary | ICD-10-CM | POA: Diagnosis not present

## 2020-11-11 DIAGNOSIS — R220 Localized swelling, mass and lump, head: Secondary | ICD-10-CM | POA: Diagnosis not present

## 2020-11-13 ENCOUNTER — Other Ambulatory Visit (HOSPITAL_COMMUNITY): Payer: Medicare Other

## 2020-11-15 ENCOUNTER — Encounter (HOSPITAL_COMMUNITY): Payer: Medicare Other

## 2020-11-30 ENCOUNTER — Telehealth: Payer: Self-pay

## 2020-11-30 NOTE — Telephone Encounter (Signed)
Pts wife called stating pt would like to convert his appt with Dr. Julien Nordmann on 12/04/20 to a phone appt because he does not drive and has not been able to secure transportation.  I have sent a schedule message to convert the appt as requested.

## 2020-12-01 ENCOUNTER — Other Ambulatory Visit: Payer: Self-pay

## 2020-12-01 ENCOUNTER — Inpatient Hospital Stay: Payer: Medicare Other | Attending: Internal Medicine

## 2020-12-01 ENCOUNTER — Ambulatory Visit (HOSPITAL_COMMUNITY)
Admission: RE | Admit: 2020-12-01 | Discharge: 2020-12-01 | Disposition: A | Discharge status: Home / Self Care | Payer: Medicare Other | Source: Ambulatory Visit | Attending: Internal Medicine | Admitting: Internal Medicine

## 2020-12-01 DIAGNOSIS — C3490 Malignant neoplasm of unspecified part of unspecified bronchus or lung: Secondary | ICD-10-CM | POA: Diagnosis not present

## 2020-12-01 DIAGNOSIS — I2510 Atherosclerotic heart disease of native coronary artery without angina pectoris: Secondary | ICD-10-CM | POA: Diagnosis not present

## 2020-12-01 DIAGNOSIS — J432 Centrilobular emphysema: Secondary | ICD-10-CM | POA: Diagnosis not present

## 2020-12-01 DIAGNOSIS — I708 Atherosclerosis of other arteries: Secondary | ICD-10-CM | POA: Diagnosis not present

## 2020-12-01 DIAGNOSIS — C3412 Malignant neoplasm of upper lobe, left bronchus or lung: Secondary | ICD-10-CM | POA: Insufficient documentation

## 2020-12-01 LAB — CBC WITH DIFFERENTIAL (CANCER CENTER ONLY)
Abs Immature Granulocytes: 0.03 10*3/uL (ref 0.00–0.07)
Basophils Absolute: 0 10*3/uL (ref 0.0–0.1)
Basophils Relative: 1 %
Eosinophils Absolute: 0.1 10*3/uL (ref 0.0–0.5)
Eosinophils Relative: 1 %
HCT: 45.4 % (ref 39.0–52.0)
Hemoglobin: 14.6 g/dL (ref 13.0–17.0)
Immature Granulocytes: 0 %
Lymphocytes Relative: 20 %
Lymphs Abs: 1.5 10*3/uL (ref 0.7–4.0)
MCH: 27.4 pg (ref 26.0–34.0)
MCHC: 32.2 g/dL (ref 30.0–36.0)
MCV: 85.2 fL (ref 80.0–100.0)
Monocytes Absolute: 0.8 10*3/uL (ref 0.1–1.0)
Monocytes Relative: 10 %
Neutro Abs: 5.1 10*3/uL (ref 1.7–7.7)
Neutrophils Relative %: 68 %
Platelet Count: 266 10*3/uL (ref 150–400)
RBC: 5.33 MIL/uL (ref 4.22–5.81)
RDW: 14.2 % (ref 11.5–15.5)
WBC Count: 7.5 10*3/uL (ref 4.0–10.5)
nRBC: 0 % (ref 0.0–0.2)

## 2020-12-01 LAB — CMP (CANCER CENTER ONLY)
ALT: 35 U/L (ref 0–44)
AST: 25 U/L (ref 15–41)
Albumin: 4.2 g/dL (ref 3.5–5.0)
Alkaline Phosphatase: 46 U/L (ref 38–126)
Anion gap: 9 (ref 5–15)
BUN: 22 mg/dL (ref 8–23)
CO2: 29 mmol/L (ref 22–32)
Calcium: 9.4 mg/dL (ref 8.9–10.3)
Chloride: 100 mmol/L (ref 98–111)
Creatinine: 1.37 mg/dL — ABNORMAL HIGH (ref 0.61–1.24)
GFR, Estimated: 55 mL/min — ABNORMAL LOW (ref >60)
Glucose, Bld: 128 mg/dL — ABNORMAL HIGH (ref 70–99)
Potassium: 4 mmol/L (ref 3.5–5.1)
Sodium: 138 mmol/L (ref 135–145)
Total Bilirubin: 0.6 mg/dL (ref 0.3–1.2)
Total Protein: 7.9 g/dL (ref 6.5–8.1)

## 2020-12-01 MED ORDER — IOHEXOL 300 MG/ML  SOLN
75.0000 mL | Freq: Once | INTRAMUSCULAR | Status: AC | PRN
  Administered 2020-12-01: 75 mL via INTRAVENOUS

## 2020-12-04 ENCOUNTER — Inpatient Hospital Stay (HOSPITAL_BASED_OUTPATIENT_CLINIC_OR_DEPARTMENT_OTHER): Payer: Medicare Other | Admitting: Internal Medicine

## 2020-12-04 DIAGNOSIS — C3490 Malignant neoplasm of unspecified part of unspecified bronchus or lung: Secondary | ICD-10-CM | POA: Diagnosis not present

## 2020-12-04 DIAGNOSIS — C3491 Malignant neoplasm of unspecified part of right bronchus or lung: Secondary | ICD-10-CM | POA: Diagnosis not present

## 2020-12-04 NOTE — Progress Notes (Signed)
[place] [phone]   [phone]) [phone]  PROGRESS NOTE FOR TELEMEDICINE VISITS  Doree Albee, MD [place] [address]  I connected with@ on 12/04/20 at 10:30 AM EST by telephone visit and verified that I am speaking with the correct person using two identifiers.   I discussed the limitations, risks, security and privacy concerns of performing an evaluation and management service by telemedicine and the availability of in-person appointments. I also discussed with the patient that there may be a patient responsible charge related to this service. The patient expressed understanding and agreed to proceed.  Other persons participating in the visit and their role in the encounter:  None.  Patient's location:  Home Provider's location: [place].  DIAGNOSIS: 1)Stage IA (T1b, N0, M0) non-small cell lung cancer, well-differentiated adenocarcinoma presented with left upper lobeS pulmonary nodule diagnosed in September 2017. 2)stage Ia non-small cell lung cancer diagnosed in March 2020.  PRIOR THERAPY: 1)Status post left VATS, left upper lobectomy with mediastinal lymph node dissection under the care of Dr. Roxan Hockey on 08/01/2016. 2)status post right VATS with wedge resection of right upper lobe nodule and bleb resection under the care of Dr. Roxan Hockey on December 10, 2018. 3)Status post bronchoscopy with endobronchial valve replacement in the anterior apical and posterior segments of right upper lobe and superior segment of right lower lobe  CURRENT THERAPY: Observation.  INTERVAL HISTORY: Randall Sullivan 70 y.o. male has a telephone virtual visit with me today for evaluation and discussion of his scan results.  The patient is feeling fine today with no concerning complaints.  He denied having any significant chest pain except for soreness from the surgical scar but no significant shortness of breath, cough or  hemoptysis.  He denied having any nausea, vomiting, diarrhea or constipation.  He has no significant weight loss or night sweats.  He has been in observation since his surgery.  He had repeat CT scan of the chest performed recently and we are having the visit for evaluation and discussion of his scan results.  MEDICAL HISTORY: Past Medical History:  Diagnosis Date  . A-fib ([place]) 2017   after last lung surgery patient had a history if Afib documented in hospital   . Allergy   . Arthritis    back & legs ( knees)  . Cancer ([place])    lung  . Complication of anesthesia    agitated upon waking from anesth.   . Emphysema   . History of lung cancer   . Hyperlipidemia   . Hypertension   . Oxygen dependent    2L continuous  . Tobacco abuse     ALLERGIES:  is allergic to no known allergies.  MEDICATIONS:  Current Outpatient Medications  Medication Sig Dispense Refill  . acetaminophen (TYLENOL) 500 MG tablet Take 1,000 mg by mouth every 6 (six) hours as needed (for arthritis pain.).     [name] albuterol (PROVENTIL) (2.5 MG/3ML) 0.083% nebulizer solution Take 3 mLs (2.5 mg total) by nebulization every 6 (six) hours as needed for wheezing or shortness of breath. 75 mL 5  . aspirin EC 81 MG tablet Take 81 mg by mouth daily.    . chlorthalidone (HYGROTON) 25 MG tablet Take 1 tablet (25 mg total) by mouth daily. 90 tablet 3  . Cholecalciferol (VITAMIN D3) 125 MCG (5000 UT) CAPS Take 1 capsule by mouth daily.    [name] DILT-XR 120 MG 24 hr capsule Take 1 capsule by mouth  twice daily 60 capsule 3  . diltiazem (CARDIZEM SR) 120 MG 12 hr capsule Take 1 capsule (120 mg total) by mouth 2 (two) times daily. New dose 60 capsule 3  . fenofibrate (TRICOR) 145 MG tablet Take 1 tablet (145 mg total) by mouth daily. 90 tablet 1  . fexofenadine (ALLEGRA) 180 MG tablet Take 180 mg by mouth daily as needed for allergies or rhinitis.    . magnesium hydroxide (MILK OF MAGNESIA) 400 MG/5ML suspension Take 30 mLs by mouth  daily as needed for mild constipation. 118 mL 0  . metoprolol succinate (TOPROL-XL) 50 MG 24 hr tablet Take 1 tablet (50 mg total) by mouth daily. Take with or immediately following a meal. 90 tablet 1  . OXYGEN Inhale 2-3 L into the lungs continuous. Hx of lung ca, copd, and emphysema    . pravastatin (PRAVACHOL) 20 MG tablet Take 1 tablet (20 mg total) by mouth daily. 90 tablet 1  . senna-docusate (SENOKOT S) 8.6-50 MG tablet Take 1 tablet by mouth 2 (two) times daily as needed for mild constipation or moderate constipation. 90 tablet 0   No current facility-administered medications for this visit.    SURGICAL HISTORY:  Past Surgical History:  Procedure Laterality Date  . HERNIA REPAIR Bilateral 5885   umbilical  . VIDEO ASSISTED THORACOSCOPY Right 12/21/2018   Procedure: VIDEO ASSISTED THORACOSCOPY AND RESECTION OF BLEBS RIGHT LOWER LOBE;  Surgeon: [name], MD;  Location: [place];  Service: Thoracic;  Laterality: Right;  [name] VIDEO ASSISTED THORACOSCOPY (VATS)/ LOBECTOMY Left 08/01/2016   Procedure: VIDEO ASSISTED THORACOSCOPY (VATS)/ LEFT UPPER LOBECTOMY with lymph node sampling and onQ placement;  Surgeon: [name], MD;  Location: [place];  Service: Thoracic;  Laterality: Left;  [name] VIDEO ASSISTED THORACOSCOPY (VATS)/WEDGE RESECTION Right 12/09/2018   Procedure: VIDEO ASSISTED THORACOSCOPY (VATS)/WEDGE RESECTION;  Surgeon: [name], MD;  Location: [place];  Service: Thoracic;  Laterality: Right;  [name] VIDEO BRONCHOSCOPY Bilateral 12/21/2018   Procedure: VIDEO BRONCHOSCOPY;  Surgeon: [name], MD;  Location: [place];  Service: Thoracic;  Laterality: Bilateral;  . VIDEO BRONCHOSCOPY WITH ENDOBRONCHIAL NAVIGATION N/A 07/03/2016   Procedure: VIDEO BRONCHOSCOPY WITH ENDOBRONCHIAL NAVIGATION;  Surgeon: [name], MD;  Location: [place];  Service: Thoracic;  Laterality: N/A;  . VIDEO BRONCHOSCOPY WITH INSERTION OF INTERBRONCHIAL VALVE (IBV) N/A 12/11/2018    Procedure: VIDEO BRONCHOSCOPY WITH INSERTION OF INTERBRONCHIAL VALVE (IBV);  Surgeon: [name], MD;  Location: [facility] OR;  Service: Thoracic;  Laterality: N/A;  . VIDEO BRONCHOSCOPY WITH INSERTION OF INTERBRONCHIAL VALVE (IBV) N/A 02/10/2019   Procedure: VIDEO BRONCHOSCOPY WITH REMOVAL OF INTERBRONCHIAL VALVE (IBV);  Surgeon: [name], MD;  Location: [place];  Service: Thoracic;  Laterality: N/A;    REVIEW OF SYSTEMS:  A comprehensive review of systems was negative except for: Respiratory: positive for pleurisy/chest pain    LABORATORY DATA: Lab Results  Component Value Date   WBC 7.5 12/01/2020   HGB 14.6 12/01/2020   HCT 45.4 12/01/2020   MCV 85.2 12/01/2020   PLT 266 12/01/2020      Chemistry      Component Value Date/Time   NA 138 12/01/2020 0959   NA 136 04/09/2017 1249   K 4.0 12/01/2020 0959   K 4.6 04/09/2017 1249   CL 100 12/01/2020 0959   CO2 29 12/01/2020 0959   CO2 27 04/09/2017 1249   BUN 22 12/01/2020 0959   BUN 23.9 04/09/2017 1249  CREATININE 1.37 (H) 12/01/2020 0959   CREATININE 1.21 (H) 05/08/2020 1051   CREATININE 1.4 (H) 04/09/2017 1249      Component Value Date/Time   CALCIUM 9.4 12/01/2020 0959   CALCIUM 9.6 04/09/2017 1249   ALKPHOS 46 12/01/2020 0959   ALKPHOS 58 04/09/2017 1249   AST 25 12/01/2020 0959   AST 13 04/09/2017 1249   ALT 35 12/01/2020 0959   ALT 9 04/09/2017 1249   BILITOT 0.6 12/01/2020 0959   BILITOT 1.01 04/09/2017 1249       RADIOGRAPHIC STUDIES: CT Chest W Contrast  Result Date: 12/02/2020 CLINICAL DATA:  Non-small cell lung cancer. EXAM: CT CHEST WITH CONTRAST TECHNIQUE: Multidetector CT imaging of the chest was performed during intravenous contrast administration. CONTRAST:  83mL OMNIPAQUE IOHEXOL 300 MG/ML  SOLN COMPARISON:  CT 06/02/2020 FINDINGS: Cardiovascular: Coronary artery calcification and aortic atherosclerotic calcification. Mediastinum/Nodes: No axillary or supraclavicular adenopathy. No  mediastinal or hilar adenopathy. No pericardial fluid. Esophagus normal. Lungs/Pleura: Postsurgical change consistent LEFT upper lobectomy and RIGHT upper lobe wedge resection. There is thickening along the RIGHT upper lobe resection margin not changed from comparison exam. No new nodularity. Likewise no suspicious nodularity in the LEFT lung. Centrilobular emphysema noted Upper Abdomen: Limited view of the liver, kidneys, pancreas are unremarkable. Normal adrenal glands. Musculoskeletal: No aggressive osseous lesion IMPRESSION: 1. Postsurgical change in the RIGHT upper lobe and LEFT upper lobe without suspicious nodularity. No evidence of local tumor recurrence or metastasis in the thorax. 2. No mediastinal adenopathy. 3. Coronary artery calcification and Aortic Atherosclerosis (ICD10-I70.0). Electronically Signed   By: Suzy Bouchard M.D.   On: 12/02/2020 13:19    ASSESSMENT AND PLAN: This is a very pleasant 70years old white male with a stage IA non-small cell lung cancer status post left lower lobectomy with lymph node dissection under the care of Dr. Roxan Hockey in November 2017 and has been observation since that time. He was also diagnosed with a stage Ia non-small cell lung cancer in March 2020 and the patient underwent wedge resection of the upper lobe in March 2020. The patient is currently on observation and he is feeling fine today with no concerning complaints. He had repeat CT scan of the chest performed recently.  I personally and independently reviewed the scans and discussed the results with the patient today. His scan showed no concerning findings for disease recurrence. I recommended for him to continue on observation with repeat CT scan of the chest in 6 months. He was advised to call immediately if he has any other concerning symptoms in the interval. I discussed the assessment and treatment plan with the patient. The patient was provided an opportunity to ask questions and all were  answered. The patient agreed with the plan and demonstrated an understanding of the instructions.   The patient was advised to call back or seek an in-person evaluation if the symptoms worsen or if the condition fails to improve as anticipated.  I provided 13 minutes of non face-to-face telephone visit time during this encounter, and > 50% was spent counseling as documented under my assessment & plan.  Eilleen Kempf, MD 12/04/2020 9:57 AM  Disclaimer: This note was dictated with voice recognition software. Similar sounding words can inadvertently be transcribed and may not be corrected upon review.

## 2020-12-05 ENCOUNTER — Telehealth: Payer: Self-pay | Admitting: Internal Medicine

## 2020-12-05 NOTE — Telephone Encounter (Signed)
Scheduled per los. Called and left msg. Mailed printout  °

## 2020-12-23 ENCOUNTER — Encounter (INDEPENDENT_AMBULATORY_CARE_PROVIDER_SITE_OTHER): Payer: Self-pay | Admitting: Internal Medicine

## 2020-12-25 ENCOUNTER — Other Ambulatory Visit (INDEPENDENT_AMBULATORY_CARE_PROVIDER_SITE_OTHER): Payer: Self-pay | Admitting: Internal Medicine

## 2020-12-25 DIAGNOSIS — C3492 Malignant neoplasm of unspecified part of left bronchus or lung: Secondary | ICD-10-CM

## 2020-12-25 DIAGNOSIS — E785 Hyperlipidemia, unspecified: Secondary | ICD-10-CM

## 2020-12-25 MED ORDER — FENOFIBRATE 145 MG PO TABS: 145.0000 mg | ORAL_TABLET | Freq: Every day | ORAL | 1 refills | Status: DC

## 2020-12-25 MED ORDER — ALBUTEROL SULFATE (2.5 MG/3ML) 0.083% IN NEBU
2.5000 mg | INHALATION_SOLUTION | Freq: Four times a day (QID) | RESPIRATORY_TRACT | 5 refills | Status: DC | PRN
Start: 2020-12-25 — End: 2021-03-07

## 2021-02-23 ENCOUNTER — Other Ambulatory Visit (INDEPENDENT_AMBULATORY_CARE_PROVIDER_SITE_OTHER): Payer: Self-pay | Admitting: Internal Medicine

## 2021-02-23 ENCOUNTER — Encounter (INDEPENDENT_AMBULATORY_CARE_PROVIDER_SITE_OTHER): Payer: Self-pay | Admitting: Internal Medicine

## 2021-02-23 DIAGNOSIS — E785 Hyperlipidemia, unspecified: Secondary | ICD-10-CM

## 2021-02-27 ENCOUNTER — Other Ambulatory Visit (INDEPENDENT_AMBULATORY_CARE_PROVIDER_SITE_OTHER): Payer: Self-pay | Admitting: Internal Medicine

## 2021-02-27 DIAGNOSIS — E785 Hyperlipidemia, unspecified: Secondary | ICD-10-CM

## 2021-02-27 MED ORDER — PRAVASTATIN SODIUM 20 MG PO TABS: 20.0000 mg | ORAL_TABLET | Freq: Every day | ORAL | 1 refills | Status: DC

## 2021-03-01 ENCOUNTER — Other Ambulatory Visit: Payer: Self-pay

## 2021-03-01 ENCOUNTER — Encounter (INDEPENDENT_AMBULATORY_CARE_PROVIDER_SITE_OTHER): Payer: Self-pay | Admitting: Nurse Practitioner

## 2021-03-01 ENCOUNTER — Telehealth (INDEPENDENT_AMBULATORY_CARE_PROVIDER_SITE_OTHER): Payer: Self-pay | Admitting: Nurse Practitioner

## 2021-03-01 ENCOUNTER — Ambulatory Visit (INDEPENDENT_AMBULATORY_CARE_PROVIDER_SITE_OTHER): Payer: Medicare Other | Admitting: Nurse Practitioner

## 2021-03-01 VITALS — BP 134/80 | HR 72 | Temp 96.0°F | Ht 70.0 in | Wt 219.6 lb

## 2021-03-01 DIAGNOSIS — E782 Mixed hyperlipidemia: Secondary | ICD-10-CM

## 2021-03-01 DIAGNOSIS — Z23 Encounter for immunization: Secondary | ICD-10-CM

## 2021-03-01 DIAGNOSIS — E559 Vitamin D deficiency, unspecified: Secondary | ICD-10-CM | POA: Diagnosis not present

## 2021-03-01 DIAGNOSIS — R931 Abnormal findings on diagnostic imaging of heart and coronary circulation: Secondary | ICD-10-CM

## 2021-03-01 DIAGNOSIS — I10 Essential (primary) hypertension: Secondary | ICD-10-CM

## 2021-03-01 DIAGNOSIS — Z1211 Encounter for screening for malignant neoplasm of colon: Secondary | ICD-10-CM

## 2021-03-01 DIAGNOSIS — L989 Disorder of the skin and subcutaneous tissue, unspecified: Secondary | ICD-10-CM

## 2021-03-01 NOTE — Telephone Encounter (Signed)
Patient requesting to be sent to [facility] for derm.

## 2021-03-01 NOTE — Patient Instructions (Signed)
Pneumococcal Polysaccharide Vaccine (PPSV23): What You Need to Know 1. Why get vaccinated? Pneumococcal polysaccharide vaccine (PPSV23) can prevent pneumococcal disease. Pneumococcal disease refers to any illness caused by pneumococcal bacteria. These bacteria can cause many types of illnesses, including pneumonia, which is an infection of the lungs. Pneumococcal bacteria are one of the most common causes of pneumonia. Besides pneumonia, pneumococcal bacteria can also cause:  Ear infections  Sinus infections  Meningitis (infection of the tissue covering the brain and spinal cord)  Bacteremia (bloodstream infection) Anyone can get pneumococcal disease, but children under 2 years of age, people with certain medical conditions, adults 65 years or older, and cigarette smokers are at the highest risk. Most pneumococcal infections are mild. However, some can result in long-term problems, such as brain damage or hearing loss. Meningitis, bacteremia, and pneumonia caused by pneumococcal disease can be fatal. 2. PPSV23 PPSV23 protects against 23 types of bacteria that cause pneumococcal disease. PPSV23 is recommended for:  All adults 71 years or older,  Anyone 2 years or older with certain medical conditions that can lead to an increased risk for pneumococcal disease. Most people need only one dose of PPSV23. A second dose of PPSV23, and another type of pneumococcal vaccine called PCV13, are recommended for certain high-risk groups. Your health care provider can give you more information. People 71 years or older should get a dose of PPSV23 even if they have already gotten one or more doses of the vaccine before they turned 65. 3. Talk with your health care provider Tell your vaccine provider if the person getting the vaccine:  Has had an allergic reaction after a previous dose of PPSV23, or has any severe, life-threatening allergies. In some cases, your health care provider may decide to postpone  PPSV23 vaccination to a future visit. People with minor illnesses, such as a cold, may be vaccinated. People who are moderately or severely ill should usually wait until they recover before getting PPSV23. Your health care provider can give you more information. 4. Risks of a vaccine reaction  Redness or pain where the shot is given, feeling tired, fever, or muscle aches can happen after PPSV23. People sometimes faint after medical procedures, including vaccination. Tell your provider if you feel dizzy or have vision changes or ringing in the ears. As with any medicine, there is a very remote chance of a vaccine causing a severe allergic reaction, other serious injury, or death. 5. What if there is a serious problem? An allergic reaction could occur after the vaccinated person leaves the clinic. If you see signs of a severe allergic reaction (hives, swelling of the face and throat, difficulty breathing, a fast heartbeat, dizziness, or weakness), call 9-1-1 and get the person to the nearest hospital. For other signs that concern you, call your health care provider. Adverse reactions should be reported to the Vaccine Adverse Event Reporting System (VAERS). Your health care provider will usually file this report, or you can do it yourself. Visit the VAERS website at www.vaers.hhs.gov or call 1-800-822-7967. VAERS is only for reporting reactions, and VAERS staff do not give medical advice. 6. How can I learn more?  Ask your health care provider.  Call your local or state health department.  Contact the Centers for Disease Control and Prevention (CDC): ? Call 1-800-232-4636 (1-800-CDC-INFO) or ? Visit CDC's website at www.cdc.gov/vaccines Vaccine Information Statement PPSV23 Vaccine (07/29/2018) This information is not intended to replace advice given to you by your health care provider. Make sure you discuss   any questions you have with your health care provider. Document Revised: 05/19/2020  Document Reviewed: 05/19/2020 Elsevier Patient Education  2021 Elsevier Inc.   

## 2021-03-01 NOTE — Addendum Note (Signed)
Addended by: Anibal Henderson on: 03/01/2021 09:45 AM   Modules accepted: Orders

## 2021-03-01 NOTE — Progress Notes (Signed)
Subjective:  Patient ID: Randall Sullivan, male    DOB: April 14, 1951  Age: 70 y.o. MRN: [id number]  CC:  Chief Complaint  Patient presents with  . Follow-up    Doing well, was sick with a stomach bug last week, pneumo vaccine  . Other    Health maintenance, vitamin D deficiency  . Hyperlipidemia  . Hypertension      HPI  This patient arrives today for the above.  Health maintenance: He would like to have pneumonia vaccine administered if he is due for this.  He tells me that someone told him he had gotten the pneumonia vaccine when he was at [facility] for his last lung surgery in 2020.  I do not see any documentation that the vaccine was administered.  He also is due for colon cancer screening and tells me that Cologuard has been ordered in the past but he never received the packet in the mail.  He does verify that his mailing address is what is currently listed in our system.  Vitamin D deficiency: He continues on 5000 IUs of vitamin D3 daily.  He is tolerating medication well.  Last serum check showed a vitamin D level of 11.  Hyperlipidemia/CAD: He is being followed by cardiology.  He continues on aspirin, fenofibrate, and pravastatin.  He is tolerating his medications well.  Hypertension: He continues on diltiazem, chlorthalidone, and metoprolol.  He is tolerating these medications well.  Skin lesions: He also mentions he has been having some skin lesions pop up on his arms and chest.  He has a lot of moles as well.  Past Medical History:  Diagnosis Date  . A-fib ([place]) 2017   after last lung surgery patient had a history if Afib documented in hospital   . Allergy   . Arthritis    back & legs ( knees)  . Cancer ([place])    lung  . Complication of anesthesia    agitated upon waking from anesth.   . Emphysema   . History of lung cancer   . Hyperlipidemia   . Hypertension   . Oxygen dependent    2L continuous  . Tobacco abuse       Family History  Problem Relation  Age of Onset  . Cancer Mother   . Cancer Sister   . Aneurysm Sister   . Kidney disease Paternal Aunt   . Asthma Brother   . Cancer - Lung Brother   . 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   Social History   Tobacco Use  . Smoking status: Former Smoker    Packs/day: 1.00    Years: 40.00    Pack years: 40.00    Types: Cigarettes    Quit date: 05/28/2016    Years since quitting: 4.7  . Smokeless tobacco: Never Used  Substance Use Topics  . Alcohol use: Not Currently    Comment: quit- 2016     Current Meds  Medication Sig  . acetaminophen (TYLENOL) 500 MG tablet Take 1,000 mg by mouth every 6 (six) hours as needed (for arthritis pain.).   [name] albuterol (PROVENTIL) (2.5 MG/3ML) 0.083% nebulizer solution Take 3 mLs (2.5 mg total) by nebulization every 6 (six) hours as needed for wheezing or shortness of breath.  [name] aspirin EC 81 MG tablet Take 81 mg by mouth daily.  . chlorthalidone (HYGROTON) 25 MG tablet Take  1 tablet (25 mg total) by mouth daily.  . Cholecalciferol (VITAMIN D3) 125 MCG (5000 UT) CAPS Take 1 capsule by mouth daily.  [name] diltiazem (CARDIZEM SR) 120 MG 12 hr capsule Take 1 capsule (120 mg total) by mouth 2 (two) times daily. New dose  . fenofibrate (TRICOR) 145 MG tablet Take 1 tablet (145 mg total) by mouth daily.  . fexofenadine (ALLEGRA) 180 MG tablet Take 180 mg by mouth daily as needed for allergies or rhinitis.  . magnesium hydroxide (MILK OF MAGNESIA) 400 MG/5ML suspension Take 30 mLs by mouth daily as needed for mild constipation.  . metoprolol succinate (TOPROL-XL) 50 MG 24 hr tablet Take 1 tablet (50 mg total) by mouth daily. Take with or immediately following a meal.  . OXYGEN Inhale 2-3 L into the lungs continuous. Hx of lung ca, copd, and emphysema  . pravastatin (PRAVACHOL) 20 MG tablet Take 1 tablet (20 mg total) by mouth daily.  [name] senna-docusate  (SENOKOT S) 8.6-50 MG tablet Take 1 tablet by mouth 2 (two) times daily as needed for mild constipation or moderate constipation.    ROS:  Review of Systems  Respiratory: Negative for shortness of breath.   Cardiovascular: Negative for chest pain.  Neurological: Negative for dizziness and headaches.     Objective:   Today's Vitals: BP 134/80   Pulse 72   Temp (!) 96 F (35.6 C) (Temporal)   Ht 5\' 10"  (1.778 m)   Wt 219 lb 9.6 oz (99.6 kg)   SpO2 95%   BMI 31.51 kg/m  Vitals with BMI 03/01/2021 10/26/2020 09/01/2020  Height 5\' 10"  5\' 10"  5\' 10"   Weight 219 lbs 10 oz (No Data) 226 lbs 10 oz  BMI 81.19 - 14.78  Systolic 295 621 308  Diastolic 80 98 90  Pulse 72 70 62     Physical Exam Vitals reviewed.  Constitutional:      Appearance: Normal appearance.  HENT:     Head: Normocephalic and atraumatic.  Cardiovascular:     Rate and Rhythm: Normal rate and regular rhythm.  Pulmonary:     Effort: Pulmonary effort is normal.     Breath sounds: Normal breath sounds.  Musculoskeletal:     Cervical back: Neck supple.  Skin:    General: Skin is warm and dry.       Neurological:     Mental Status: He is alert and oriented to person, place, and time.  Psychiatric:        Mood and Affect: Mood normal.        Behavior: Behavior normal.        Thought Content: Thought content normal.        Judgment: Judgment normal.          Assessment and Plan   1. Skin lesion   2. Colon cancer screening   3. Need for pneumococcal vaccination   4. Elevated coronary artery calcium score   5. Essential (primary) hypertension   6. Mixed hyperlipidemia   7. Vitamin D deficiency disease      Plan: 1.  We will refer patient to dermatology for further evaluation of the skin lesions as some do appear suspicious for possible squamous cell carcinoma.  He tells me he understands. 2.  We will reorder Cologuard for colon cancer screening.  He does deny history of personal or family colon  cancer, inflammatory bowel disease, Crohn's disease. 3.  We will administer PPSV23 vaccine today. 4., 6.  He will continue  to follow-up with cardiology. 5.  Blood pressure reasonable on current medication regimen he will continue take medications as prescribed. 7.  He will continue taking his vitamin D supplement.  We will consider checking serum vitamin D level at next office visit.    Tests ordered Orders Placed This Encounter  Procedures  . Cologuard  . Ambulatory referral to Dermatology      No orders of the defined types were placed in this encounter.   Patient to follow-up in 3 months or sooner as needed.  Ailene Ards, NP

## 2021-03-05 NOTE — Telephone Encounter (Signed)
Sent to Dr hall office in [place].

## 2021-03-07 ENCOUNTER — Other Ambulatory Visit (INDEPENDENT_AMBULATORY_CARE_PROVIDER_SITE_OTHER): Payer: Self-pay | Admitting: Internal Medicine

## 2021-03-07 DIAGNOSIS — C3492 Malignant neoplasm of unspecified part of left bronchus or lung: Secondary | ICD-10-CM

## 2021-03-12 ENCOUNTER — Observation Stay (HOSPITAL_COMMUNITY): Payer: Medicare Other

## 2021-03-12 ENCOUNTER — Other Ambulatory Visit: Payer: Self-pay

## 2021-03-12 ENCOUNTER — Emergency Department (HOSPITAL_COMMUNITY): Payer: Medicare Other

## 2021-03-12 ENCOUNTER — Telehealth (INDEPENDENT_AMBULATORY_CARE_PROVIDER_SITE_OTHER): Payer: Self-pay

## 2021-03-12 ENCOUNTER — Inpatient Hospital Stay (HOSPITAL_COMMUNITY)
Admission: EM | Admit: 2021-03-12 | Discharge: 2021-03-14 | DRG: 065 | Disposition: A | Discharge status: Rehabilitation Facility | Payer: Medicare Other | Attending: Internal Medicine | Admitting: Internal Medicine

## 2021-03-12 ENCOUNTER — Other Ambulatory Visit (INDEPENDENT_AMBULATORY_CARE_PROVIDER_SITE_OTHER): Payer: Self-pay | Admitting: Internal Medicine

## 2021-03-12 ENCOUNTER — Encounter (HOSPITAL_COMMUNITY): Payer: Self-pay | Admitting: Emergency Medicine

## 2021-03-12 DIAGNOSIS — I69320 Aphasia following cerebral infarction: Secondary | ICD-10-CM | POA: Diagnosis not present

## 2021-03-12 DIAGNOSIS — Z825 Family history of asthma and other chronic lower respiratory diseases: Secondary | ICD-10-CM

## 2021-03-12 DIAGNOSIS — G8191 Hemiplegia, unspecified affecting right dominant side: Secondary | ICD-10-CM | POA: Diagnosis not present

## 2021-03-12 DIAGNOSIS — I639 Cerebral infarction, unspecified: Secondary | ICD-10-CM | POA: Diagnosis not present

## 2021-03-12 DIAGNOSIS — I700 Atherosclerosis of aorta: Secondary | ICD-10-CM | POA: Diagnosis present

## 2021-03-12 DIAGNOSIS — R29898 Other symptoms and signs involving the musculoskeletal system: Secondary | ICD-10-CM

## 2021-03-12 DIAGNOSIS — I6621 Occlusion and stenosis of right posterior cerebral artery: Secondary | ICD-10-CM | POA: Diagnosis not present

## 2021-03-12 DIAGNOSIS — I619 Nontraumatic intracerebral hemorrhage, unspecified: Secondary | ICD-10-CM | POA: Diagnosis not present

## 2021-03-12 DIAGNOSIS — H53461 Homonymous bilateral field defects, right side: Secondary | ICD-10-CM | POA: Diagnosis present

## 2021-03-12 DIAGNOSIS — H9190 Unspecified hearing loss, unspecified ear: Secondary | ICD-10-CM | POA: Diagnosis present

## 2021-03-12 DIAGNOSIS — Z91048 Other nonmedicinal substance allergy status: Secondary | ICD-10-CM

## 2021-03-12 DIAGNOSIS — R29704 NIHSS score 4: Secondary | ICD-10-CM | POA: Diagnosis present

## 2021-03-12 DIAGNOSIS — N179 Acute kidney failure, unspecified: Secondary | ICD-10-CM | POA: Diagnosis not present

## 2021-03-12 DIAGNOSIS — Z20822 Contact with and (suspected) exposure to covid-19: Secondary | ICD-10-CM | POA: Diagnosis present

## 2021-03-12 DIAGNOSIS — E876 Hypokalemia: Secondary | ICD-10-CM | POA: Diagnosis present

## 2021-03-12 DIAGNOSIS — Z87891 Personal history of nicotine dependence: Secondary | ICD-10-CM

## 2021-03-12 DIAGNOSIS — E1165 Type 2 diabetes mellitus with hyperglycemia: Secondary | ICD-10-CM | POA: Diagnosis present

## 2021-03-12 DIAGNOSIS — I10 Essential (primary) hypertension: Secondary | ICD-10-CM | POA: Diagnosis present

## 2021-03-12 DIAGNOSIS — Z9981 Dependence on supplemental oxygen: Secondary | ICD-10-CM | POA: Diagnosis not present

## 2021-03-12 DIAGNOSIS — R0602 Shortness of breath: Secondary | ICD-10-CM | POA: Diagnosis not present

## 2021-03-12 DIAGNOSIS — R404 Transient alteration of awareness: Secondary | ICD-10-CM | POA: Diagnosis not present

## 2021-03-12 DIAGNOSIS — Z85118 Personal history of other malignant neoplasm of bronchus and lung: Secondary | ICD-10-CM | POA: Diagnosis not present

## 2021-03-12 DIAGNOSIS — G319 Degenerative disease of nervous system, unspecified: Secondary | ICD-10-CM | POA: Diagnosis not present

## 2021-03-12 DIAGNOSIS — R4701 Aphasia: Secondary | ICD-10-CM | POA: Diagnosis not present

## 2021-03-12 DIAGNOSIS — I63539 Cerebral infarction due to unspecified occlusion or stenosis of unspecified posterior cerebral artery: Secondary | ICD-10-CM | POA: Diagnosis not present

## 2021-03-12 DIAGNOSIS — Z7982 Long term (current) use of aspirin: Secondary | ICD-10-CM | POA: Diagnosis not present

## 2021-03-12 DIAGNOSIS — E1169 Type 2 diabetes mellitus with other specified complication: Secondary | ICD-10-CM | POA: Diagnosis not present

## 2021-03-12 DIAGNOSIS — R531 Weakness: Secondary | ICD-10-CM

## 2021-03-12 DIAGNOSIS — R0789 Other chest pain: Secondary | ICD-10-CM | POA: Diagnosis not present

## 2021-03-12 DIAGNOSIS — R0902 Hypoxemia: Secondary | ICD-10-CM | POA: Diagnosis present

## 2021-03-12 DIAGNOSIS — I480 Paroxysmal atrial fibrillation: Secondary | ICD-10-CM | POA: Diagnosis not present

## 2021-03-12 DIAGNOSIS — I63532 Cerebral infarction due to unspecified occlusion or stenosis of left posterior cerebral artery: Principal | ICD-10-CM | POA: Diagnosis present

## 2021-03-12 DIAGNOSIS — J441 Chronic obstructive pulmonary disease with (acute) exacerbation: Secondary | ICD-10-CM | POA: Diagnosis present

## 2021-03-12 DIAGNOSIS — E785 Hyperlipidemia, unspecified: Secondary | ICD-10-CM | POA: Diagnosis present

## 2021-03-12 DIAGNOSIS — I69351 Hemiplegia and hemiparesis following cerebral infarction affecting right dominant side: Secondary | ICD-10-CM

## 2021-03-12 DIAGNOSIS — G4489 Other headache syndrome: Secondary | ICD-10-CM | POA: Diagnosis not present

## 2021-03-12 DIAGNOSIS — J80 Acute respiratory distress syndrome: Secondary | ICD-10-CM | POA: Diagnosis not present

## 2021-03-12 DIAGNOSIS — E669 Obesity, unspecified: Secondary | ICD-10-CM | POA: Diagnosis not present

## 2021-03-12 DIAGNOSIS — E1122 Type 2 diabetes mellitus with diabetic chronic kidney disease: Secondary | ICD-10-CM | POA: Diagnosis not present

## 2021-03-12 DIAGNOSIS — Z801 Family history of malignant neoplasm of trachea, bronchus and lung: Secondary | ICD-10-CM

## 2021-03-12 DIAGNOSIS — Z79899 Other long term (current) drug therapy: Secondary | ICD-10-CM

## 2021-03-12 DIAGNOSIS — I6389 Other cerebral infarction: Secondary | ICD-10-CM | POA: Diagnosis not present

## 2021-03-12 DIAGNOSIS — R079 Chest pain, unspecified: Secondary | ICD-10-CM | POA: Diagnosis not present

## 2021-03-12 DIAGNOSIS — E782 Mixed hyperlipidemia: Secondary | ICD-10-CM | POA: Diagnosis present

## 2021-03-12 DIAGNOSIS — I6523 Occlusion and stenosis of bilateral carotid arteries: Secondary | ICD-10-CM | POA: Diagnosis not present

## 2021-03-12 LAB — CBC
HCT: 46 % (ref 39.0–52.0)
Hemoglobin: 15.3 g/dL (ref 13.0–17.0)
MCH: 28.8 pg (ref 26.0–34.0)
MCHC: 33.3 g/dL (ref 30.0–36.0)
MCV: 86.6 fL (ref 80.0–100.0)
Platelets: 239 10*3/uL (ref 150–400)
RBC: 5.31 MIL/uL (ref 4.22–5.81)
RDW: 13.9 % (ref 11.5–15.5)
WBC: 6.5 10*3/uL (ref 4.0–10.5)
nRBC: 0 % (ref 0.0–0.2)

## 2021-03-12 LAB — MRSA PCR SCREENING: MRSA by PCR: NEGATIVE

## 2021-03-12 LAB — LIPID PANEL
Cholesterol: 242 mg/dL — ABNORMAL HIGH (ref 0–200)
HDL: 29 mg/dL — ABNORMAL LOW (ref >40)
LDL Cholesterol: UNDETERMINED mg/dL (ref 0–99)
Total CHOL/HDL Ratio: 8.3 RATIO
Triglycerides: 782 mg/dL — ABNORMAL HIGH (ref <150)
VLDL: UNDETERMINED mg/dL (ref 0–40)

## 2021-03-12 LAB — DIFFERENTIAL
Abs Immature Granulocytes: 0.06 10*3/uL (ref 0.00–0.07)
Basophils Absolute: 0.1 10*3/uL (ref 0.0–0.1)
Basophils Relative: 1 %
Eosinophils Absolute: 0.1 10*3/uL (ref 0.0–0.5)
Eosinophils Relative: 2 %
Immature Granulocytes: 1 %
Lymphocytes Relative: 25 %
Lymphs Abs: 1.7 10*3/uL (ref 0.7–4.0)
Monocytes Absolute: 0.7 10*3/uL (ref 0.1–1.0)
Monocytes Relative: 11 %
Neutro Abs: 3.9 10*3/uL (ref 1.7–7.7)
Neutrophils Relative %: 60 %

## 2021-03-12 LAB — BLOOD GAS, ARTERIAL
Acid-Base Excess: 2.7 mmol/L — ABNORMAL HIGH (ref 0.0–2.0)
Bicarbonate: 26.2 mmol/L (ref 20.0–28.0)
FIO2: 21
O2 Saturation: 93.1 %
Patient temperature: 37
pCO2 arterial: 45.5 mmHg (ref 32.0–48.0)
pH, Arterial: 7.394 (ref 7.350–7.450)
pO2, Arterial: 70 mmHg — ABNORMAL LOW (ref 83.0–108.0)

## 2021-03-12 LAB — COMPREHENSIVE METABOLIC PANEL
ALT: 35 U/L (ref 0–44)
AST: 27 U/L (ref 15–41)
Albumin: 4.1 g/dL (ref 3.5–5.0)
Alkaline Phosphatase: 46 U/L (ref 38–126)
Anion gap: 7 (ref 5–15)
BUN: 19 mg/dL (ref 8–23)
CO2: 29 mmol/L (ref 22–32)
Calcium: 9.2 mg/dL (ref 8.9–10.3)
Chloride: 98 mmol/L (ref 98–111)
Creatinine, Ser: 1.04 mg/dL (ref 0.61–1.24)
GFR, Estimated: >60 mL/min (ref >60)
Glucose, Bld: 170 mg/dL — ABNORMAL HIGH (ref 70–99)
Potassium: 3.4 mmol/L — ABNORMAL LOW (ref 3.5–5.1)
Sodium: 134 mmol/L — ABNORMAL LOW (ref 135–145)
Total Bilirubin: 0.4 mg/dL (ref 0.3–1.2)
Total Protein: 7.1 g/dL (ref 6.5–8.1)

## 2021-03-12 LAB — RAPID URINE DRUG SCREEN, HOSP PERFORMED
Amphetamines: NOT DETECTED
Barbiturates: NOT DETECTED
Benzodiazepines: NOT DETECTED
Cocaine: NOT DETECTED
Opiates: NOT DETECTED
Tetrahydrocannabinol: NOT DETECTED

## 2021-03-12 LAB — URINALYSIS, ROUTINE W REFLEX MICROSCOPIC
Bilirubin Urine: NEGATIVE
Glucose, UA: 50 mg/dL — AB
Hgb urine dipstick: NEGATIVE
Ketones, ur: NEGATIVE mg/dL
Leukocytes,Ua: NEGATIVE
Nitrite: NEGATIVE
Protein, ur: NEGATIVE mg/dL
Specific Gravity, Urine: 1.019 (ref 1.005–1.030)
pH: 5 (ref 5.0–8.0)

## 2021-03-12 LAB — GLUCOSE, CAPILLARY
Glucose-Capillary: 210 mg/dL — ABNORMAL HIGH (ref 70–99)
Glucose-Capillary: 164 mg/dL — ABNORMAL HIGH (ref 70–99)
Glucose-Capillary: 118 mg/dL — ABNORMAL HIGH (ref 70–99)
Glucose-Capillary: 116 mg/dL — ABNORMAL HIGH (ref 70–99)

## 2021-03-12 LAB — PROTIME-INR
INR: 0.9 (ref 0.8–1.2)
Prothrombin Time: 12.6 seconds (ref 11.4–15.2)

## 2021-03-12 LAB — APTT: aPTT: 28 seconds (ref 24–36)

## 2021-03-12 LAB — RESP PANEL BY RT-PCR (FLU A&B, COVID) ARPGX2
Influenza A by PCR: NEGATIVE
Influenza B by PCR: NEGATIVE
SARS Coronavirus 2 by RT PCR: NEGATIVE

## 2021-03-12 LAB — BRAIN NATRIURETIC PEPTIDE: B Natriuretic Peptide: 40 pg/mL (ref 0.0–100.0)

## 2021-03-12 LAB — ETHANOL: Alcohol, Ethyl (B): <10 mg/dL (ref <10)

## 2021-03-12 LAB — CBG MONITORING, ED: Glucose-Capillary: 192 mg/dL — ABNORMAL HIGH (ref 70–99)

## 2021-03-12 MED ORDER — ACETAMINOPHEN 325 MG PO TABS
650.0000 mg | ORAL_TABLET | Freq: Four times a day (QID) | ORAL | Status: DC | PRN
  Administered 2021-03-13 – 2021-03-14 (×2): 650 mg via ORAL
  Filled 2021-03-12 (×2): qty 2

## 2021-03-12 MED ORDER — CHLORHEXIDINE GLUCONATE CLOTH 2 % EX PADS: 6.0000 | MEDICATED_PAD | Freq: Every day | CUTANEOUS | Status: DC

## 2021-03-12 MED ORDER — MELATONIN 3 MG PO TABS
6.0000 mg | ORAL_TABLET | Freq: Every day | ORAL | Status: DC
  Administered 2021-03-12 – 2021-03-13 (×2): 6 mg via ORAL
  Filled 2021-03-12 (×2): qty 2

## 2021-03-12 MED ORDER — LORATADINE 10 MG PO TABS
10.0000 mg | ORAL_TABLET | Freq: Every day | ORAL | Status: DC
  Administered 2021-03-12 – 2021-03-14 (×3): 10 mg via ORAL
  Filled 2021-03-12 (×3): qty 1

## 2021-03-12 MED ORDER — PRAVASTATIN SODIUM 10 MG PO TABS
20.0000 mg | ORAL_TABLET | Freq: Every day | ORAL | Status: DC
  Administered 2021-03-12 – 2021-03-14 (×3): 20 mg via ORAL
  Filled 2021-03-12 (×3): qty 2

## 2021-03-12 MED ORDER — LORAZEPAM 2 MG/ML IJ SOLN
0.5000 mg | Freq: Once | INTRAMUSCULAR | Status: AC | PRN
  Administered 2021-03-12: 0.5 mg via INTRAVENOUS
  Filled 2021-03-12: qty 1

## 2021-03-12 MED ORDER — ONDANSETRON HCL 4 MG/2ML IJ SOLN: 4.0000 mg | Freq: Four times a day (QID) | INTRAMUSCULAR | Status: DC | PRN

## 2021-03-12 MED ORDER — ONDANSETRON HCL 4 MG PO TABS: 4.0000 mg | ORAL_TABLET | Freq: Four times a day (QID) | ORAL | Status: DC | PRN

## 2021-03-12 MED ORDER — MAGNESIUM HYDROXIDE 400 MG/5ML PO SUSP: 30.0000 mL | Freq: Every day | ORAL | Status: DC | PRN

## 2021-03-12 MED ORDER — INSULIN ASPART 100 UNIT/ML IJ SOLN
0.0000 [IU] | Freq: Three times a day (TID) | INTRAMUSCULAR | Status: DC
Start: 2021-03-12 — End: 2021-03-14
  Administered 2021-03-12: 3 [IU] via SUBCUTANEOUS
  Administered 2021-03-12: 5 [IU] via SUBCUTANEOUS
  Administered 2021-03-13: 3 [IU] via SUBCUTANEOUS
  Administered 2021-03-13 – 2021-03-14 (×2): 2 [IU] via SUBCUTANEOUS
  Administered 2021-03-14: 3 [IU] via SUBCUTANEOUS

## 2021-03-12 MED ORDER — ENOXAPARIN SODIUM 60 MG/0.6ML IJ SOSY
50.0000 mg | PREFILLED_SYRINGE | INTRAMUSCULAR | Status: DC
  Administered 2021-03-12 – 2021-03-14 (×3): 50 mg via SUBCUTANEOUS
  Filled 2021-03-12 (×3): qty 0.6

## 2021-03-12 MED ORDER — MELATONIN 5 MG PO TABS
5.0000 mg | ORAL_TABLET | Freq: Every day | ORAL | Status: DC
  Filled 2021-03-12 (×2): qty 1

## 2021-03-12 MED ORDER — STROKE: EARLY STAGES OF RECOVERY BOOK
Freq: Once | Status: AC
  Filled 2021-03-12: qty 1

## 2021-03-12 MED ORDER — SENNOSIDES-DOCUSATE SODIUM 8.6-50 MG PO TABS: 1.0000 | ORAL_TABLET | Freq: Two times a day (BID) | ORAL | Status: DC | PRN

## 2021-03-12 MED ORDER — POTASSIUM CHLORIDE CRYS ER 20 MEQ PO TBCR
40.0000 meq | EXTENDED_RELEASE_TABLET | Freq: Once | ORAL | Status: AC
  Administered 2021-03-12: 40 meq via ORAL
  Filled 2021-03-12: qty 2

## 2021-03-12 MED ORDER — ASPIRIN EC 81 MG PO TBEC: 81.0000 mg | DELAYED_RELEASE_TABLET | Freq: Every day | ORAL | Status: DC

## 2021-03-12 MED ORDER — ACETAMINOPHEN 650 MG RE SUPP: 650.0000 mg | Freq: Four times a day (QID) | RECTAL | Status: DC | PRN

## 2021-03-12 MED ORDER — ASPIRIN EC 325 MG PO TBEC
325.0000 mg | DELAYED_RELEASE_TABLET | Freq: Every day | ORAL | Status: DC
  Administered 2021-03-12 – 2021-03-14 (×3): 325 mg via ORAL
  Filled 2021-03-12 (×3): qty 1

## 2021-03-12 MED ORDER — FENOFIBRATE 160 MG PO TABS
160.0000 mg | ORAL_TABLET | Freq: Every day | ORAL | Status: DC
  Administered 2021-03-12 – 2021-03-14 (×3): 160 mg via ORAL
  Filled 2021-03-12 (×5): qty 1

## 2021-03-12 MED ORDER — IPRATROPIUM-ALBUTEROL 0.5-2.5 (3) MG/3ML IN SOLN
3.0000 mL | RESPIRATORY_TRACT | Status: AC
  Administered 2021-03-12 (×3): 3 mL via RESPIRATORY_TRACT
  Filled 2021-03-12: qty 3
  Filled 2021-03-12: qty 6

## 2021-03-12 MED ORDER — ALBUTEROL SULFATE (2.5 MG/3ML) 0.083% IN NEBU
2.5000 mg | INHALATION_SOLUTION | Freq: Four times a day (QID) | RESPIRATORY_TRACT | Status: DC | PRN
  Administered 2021-03-12 – 2021-03-13 (×4): 2.5 mg via RESPIRATORY_TRACT
  Filled 2021-03-12 (×3): qty 3

## 2021-03-12 MED ORDER — VITAMIN D 25 MCG (1000 UNIT) PO TABS
5000.0000 [IU] | ORAL_TABLET | Freq: Every day | ORAL | Status: DC
  Administered 2021-03-12 – 2021-03-14 (×3): 5000 [IU] via ORAL
  Filled 2021-03-12 (×3): qty 5

## 2021-03-12 NOTE — Evaluation (Addendum)
Physical Therapy Evaluation Patient Details Name: Randall Sullivan MRN: [id number] DOB: 1951-09-11 Today's Date: 03/12/2021   History of Present Illness  Randall Sullivan is a 70 y.o. male with medical history significant of paroxysmal atrial fibrillation, osteoarthritis, seasonal allergies, lung cancer, emphysema on home oxygen at 2 LPM, active smoker, hyperlipidemia, hypertension who was LKW  around 2000 on 03/11/2021 when he went to sleep and woke up around 0100 on 03/12/2021 with complaints of pressure-like nonradiating chest pain and shortness of breath.  No palpitations or orthopnea.  No recent lower extremity edema.  His wife stated he seemed a little confused initially, but this subsequently improved.  No aphasia or dysarthria.  No trouble comprehending language.  No headaches, nausea, blurred vision or lightheadedness.  However, the patient was clearly weaker on the right side, which was noticed by EMS but it improved in route to the hospital.  No fever, chills, sore throat, rhinorrhea, hemoptysis, but positive dyspnea and wheezing.  He has occasionally constipated, but otherwise denies abdominal pain, diarrhea, melena or hematochezia.  No dysuria, frequency or hematuria.  No polyuria, polydipsia, polyphagia or blurred vision   Clinical Impression  Patient presents with R sided weakness noted to decrease strength of dorsiflexors, knee extensors, knee flexors, hip flexors to 3+/5. The pt was able to transfer from sit to stand with min/mod assist but required verbal and tactile cues to maintain balance. Patient was able to ambulate 25 feet which included 5 side steps with verbal and tactile cues, along with 20 feet of ambulation with RW, verbal and tactile cues to slow down and be conscious of his right foot. Patient demonstrated good return to his bed where hew as able to reposition himself for comfort. Patient will benefit from continued physical therapy in hospital and recommended venue below to increase  strength, balance, endurance for safe ADLs and gait.     Follow Up Recommendations CIR    Equipment Recommendations  Rolling walker with 5" wheels    Recommendations for Other Services       Precautions / Restrictions Precautions Precautions: Fall Restrictions Weight Bearing Restrictions: No      Mobility  Bed Mobility Overal bed mobility: Modified Independent             General bed mobility comments: increased time and labored movements Patient Response: Cooperative;Impulsive  Transfers Overall transfer level: Needs assistance Equipment used: Rolling walker (2 wheeled) Transfers: Stand Pivot Transfers   Stand pivot transfers: Mod assist;Min assist       General transfer comment: min/mod assist and verbal.tactile cues for RW use and LE placement.  Ambulation/Gait Ambulation/Gait assistance: Mod assist Gait Distance (Feet): 25 Feet Assistive device: Rolling walker (2 wheeled) Gait Pattern/deviations: Step-to pattern;Decreased step length - right;Decreased stance time - right;Decreased stride length;Decreased weight shift to right Gait velocity: decreased   General Gait Details: Patient required verbal cues to slow donw adn take steps with his right foot, patient leans to the right during ambulation  Stairs            Wheelchair Mobility    Modified Rankin (Stroke Patients Only)       Balance Overall balance assessment: Needs assistance Sitting-balance support: Bilateral upper extremity supported;Feet supported Sitting balance-Leahy Scale: Poor Sitting balance - Comments: fair/poor at EOB Postural control: Right lateral lean Standing balance support: Bilateral upper extremity supported;During functional activity Standing balance-Leahy Scale: Poor Standing balance comment: fair/poor with RW  Pertinent Vitals/Pain Pain Assessment: No/denies pain    Home Living Family/patient expects to be discharged  to:: Private residence Living Arrangements: Spouse/significant other Available Help at Discharge: Family;Available 24 hours/day Type of Home: House Home Access: Stairs to enter Entrance Stairs-Rails: Left;Right;Can reach both Technical brewer of Steps: 3 Home Layout: One level Home Equipment: Cane - single point      Prior Function Level of Independence: Independent         Comments: Patient was a Nurse, learning disability        Extremity/Trunk Assessment   Upper Extremity Assessment Upper Extremity Assessment: RUE deficits/detail    Lower Extremity Assessment Lower Extremity Assessment: RLE deficits/detail RLE Deficits / Details: 3+/5 for Dorsiflexors, knee extensors, knee flexors, hip flexors RLE Coordination: decreased gross motor    Cervical / Trunk Assessment Cervical / Trunk Assessment: Kyphotic  Communication   Communication: No difficulties  Cognition Arousal/Alertness: Awake/alert Behavior During Therapy: WFL for tasks assessed/performed;Impulsive Overall Cognitive Status: Within Functional Limits for tasks assessed                                 General Comments: Patient is slighly impulsive about transfers and gait      General Comments      Exercises     Assessment/Plan    PT Assessment Patient needs continued PT services  PT Problem List Decreased strength;Decreased activity tolerance;Decreased balance;Decreased mobility;Decreased coordination;Decreased knowledge of use of DME;Decreased safety awareness;Decreased knowledge of precautions       PT Treatment Interventions DME instruction;Gait training;Functional mobility training;Therapeutic activities;Therapeutic exercise;Balance training;Neuromuscular re-education;Patient/family education    PT Goals (Current goals can be found in the Care Plan section)  Acute Rehab PT Goals Patient Stated Goal: return home PT Goal Formulation: With patient/family Time  For Goal Achievement: 03/26/21 Potential to Achieve Goals: Good    Frequency Min 5X/week   Barriers to discharge        Co-evaluation               AM-PAC PT "6 Clicks" Mobility  Outcome Measure Help needed turning from your back to your side while in a flat bed without using bedrails?: None Help needed moving from lying on your back to sitting on the side of a flat bed without using bedrails?: None Help needed moving to and from a bed to a chair (including a wheelchair)?: A Lot Help needed standing up from a chair using your arms (e.g., wheelchair or bedside chair)?: A Lot Help needed to walk in hospital room?: A Lot Help needed climbing 3-5 steps with a railing? : A Lot 6 Click Score: 16    End of Session Equipment Utilized During Treatment: Gait belt Activity Tolerance: Patient tolerated treatment well;Patient limited by fatigue Patient left: in bed;with call bell/phone within reach;with bed alarm set;with family/visitor present Nurse Communication: Mobility status PT Visit Diagnosis: Unsteadiness on feet (R26.81);Other abnormalities of gait and mobility (R26.89);Muscle weakness (generalized) (M62.81)    Time: 4825-0037 PT Time Calculation (min) (ACUTE ONLY): 31 min   Charges:   PT Evaluation $PT Eval Moderate Complexity: 1 Mod PT Treatments $Therapeutic Activity: 23-37 mins      3:21 PM, 03/12/21 Miche Loughridge Cousler SPT  3:21 PM, 03/12/21 Lonell Grandchild, MPT Physical Therapist with [facility] [phone] office [phone] mobile phone

## 2021-03-12 NOTE — ED Provider Notes (Signed)
[facility] EMERGENCY DEPARTMENT Provider Note   CSN: [id number] Arrival date & time: 03/12/21  0150     History Chief Complaint  Patient presents with   Shortness of Breath    Randall Sullivan is a 70 y.o. male.  Called EMS for sob. On arrival was apparently struggling to breathe. En route, found to have RUE/RLE weakness. Apparently has improved en route. Other vitals unremarkable.    Neurologic Problem This is a new problem. The current episode started 3 to 5 hours ago. The problem occurs constantly. The problem has been gradually improving. Associated symptoms include chest pain, headaches and shortness of breath. Pertinent negatives include no abdominal pain. Nothing aggravates the symptoms. Nothing relieves the symptoms. He has tried nothing for the symptoms. The treatment provided no relief.      Past Medical History:  Diagnosis Date   A-fib ([place]) 2017   after last lung surgery patient had a history if Afib documented in hospital    Allergy    Arthritis    back & legs ( knees)   Cancer ([place])    lung   Complication of anesthesia    agitated upon waking from anesth.    Emphysema    History of lung cancer    Hyperlipidemia    Hypertension    Oxygen dependent    2L continuous   Tobacco abuse     Patient Active Problem List   Diagnosis Date Noted   Right sided weakness 03/12/2021   COPD with acute exacerbation ([place]) 03/12/2021   Hypokalemia 03/12/2021   Hyperglycemia 03/12/2021   Aortic atherosclerosis ([place]) 08/30/2020   Elevated coronary artery calcium score 08/30/2020   Hyperlipidemia 11/17/2019   TSH (thyroid-stimulating hormone deficiency) 11/17/2019   S/P Wedge Resection RUL, Blebectomy RUL 12/09/2018   Primary lung adenocarcinoma ([place]) 10/15/2016   PAF (paroxysmal atrial fibrillation) ([place]) 09/16/2016   Encounter for therapeutic drug monitoring 09/16/2016   S/P lobectomy of lung 08/05/2016   Cancer of left lung ([place]) 08/01/2016   Lung nodule 06/11/2016    Former smoker 06/11/2016   Emphysema of lung ([place]) 06/11/2016   Essential (primary) hypertension 10/06/2014    Past Surgical History:  Procedure Laterality Date   HERNIA REPAIR Bilateral 3151   umbilical   VIDEO ASSISTED THORACOSCOPY Right 12/21/2018   Procedure: VIDEO ASSISTED THORACOSCOPY AND RESECTION OF BLEBS RIGHT LOWER LOBE;  Surgeon: [name], MD;  Location: [facility];  Service: Thoracic;  Laterality: Right;   [place] (VATS)/ LOBECTOMY Left 08/01/2016   Procedure: VIDEO ASSISTED THORACOSCOPY (VATS)/ LEFT UPPER LOBECTOMY with lymph node sampling and onQ placement;  Surgeon: [name], MD;  Location: [facility];  Service: Thoracic;  Laterality: Left;   [place] (VATS)/WEDGE RESECTION Right 12/09/2018   Procedure: VIDEO ASSISTED THORACOSCOPY (VATS)/WEDGE RESECTION;  Surgeon: [name], MD;  Location: [facility] OR;  Service: Thoracic;  Laterality: Right;   VIDEO BRONCHOSCOPY Bilateral 12/21/2018   Procedure: VIDEO BRONCHOSCOPY;  Surgeon: [name], MD;  Location: [facility];  Service: Thoracic;  Laterality: Bilateral;   VIDEO BRONCHOSCOPY WITH ENDOBRONCHIAL NAVIGATION N/A 07/03/2016   Procedure: VIDEO BRONCHOSCOPY WITH ENDOBRONCHIAL NAVIGATION;  Surgeon: [name], MD;  Location: [facility];  Service: Thoracic;  Laterality: N/A;   VIDEO BRONCHOSCOPY WITH INSERTION OF INTERBRONCHIAL VALVE (IBV) N/A 12/11/2018   Procedure: VIDEO BRONCHOSCOPY WITH INSERTION OF INTERBRONCHIAL VALVE (IBV);  Surgeon: [name], MD;  Location: [facility];  Service: Thoracic;  Laterality: N/A;   VIDEO BRONCHOSCOPY  WITH INSERTION OF INTERBRONCHIAL VALVE (IBV) N/A 02/10/2019   Procedure: VIDEO BRONCHOSCOPY WITH REMOVAL OF INTERBRONCHIAL VALVE (IBV);  Surgeon: [name], MD;  Location: [facility] OR;  Service: Thoracic;  Laterality: N/A;       Family History  Problem Relation Age of Onset   Cancer Mother    Cancer Sister    Aneurysm  Sister    Kidney disease Paternal Aunt    Asthma Brother    Cancer - Lung Brother    Cancer Brother     Social History   Tobacco Use   Smoking status: Former    Packs/day: 1.00    Years: 40.00    Pack years: 40.00    Types: Cigarettes    Quit date: 05/28/2016    Years since quitting: 4.7   Smokeless tobacco: Never  Vaping Use   Vaping Use: Never used  Substance Use Topics   Alcohol use: Not Currently    Comment: quit- 2016   Drug use: No    Home Medications Prior to Admission medications   Medication Sig Start Date End Date Taking? Authorizing Provider  acetaminophen (TYLENOL) 500 MG tablet Take 1,000 mg by mouth every 6 (six) hours as needed (for arthritis pain.).     [provider]  albuterol (PROVENTIL) (2.5 MG/3ML) 0.083% nebulizer solution Take 3 mLs (2.5 mg total) by nebulization every 6 (six) hours as needed for wheezing or shortness of breath. 03/07/21   Doree Albee, MD  aspirin EC 81 MG tablet Take 81 mg by mouth daily.    [provider]  chlorthalidone (HYGROTON) 25 MG tablet Take 1 tablet (25 mg total) by mouth daily. 09/01/20 11/30/20  Minus Breeding, MD  Cholecalciferol (VITAMIN D3) 125 MCG (5000 UT) CAPS Take 1 capsule by mouth daily.    [provider]  diltiazem (CARDIZEM SR) 120 MG 12 hr capsule Take 1 capsule (120 mg total) by mouth 2 (two) times daily. New dose 11/03/20   Gosrani, Nimish C, MD  diltiazem (DILT-XR) 120 MG 24 hr capsule Take 1 capsule (120 mg total) by mouth 2 (two) times daily. 03/07/21 04/06/21  Doree Albee, MD  fenofibrate (TRICOR) 145 MG tablet Take 1 tablet (145 mg total) by mouth daily. 12/25/20   Doree Albee, MD  fexofenadine (ALLEGRA) 180 MG tablet Take 180 mg by mouth daily as needed for allergies or rhinitis.    [provider]  magnesium hydroxide (MILK OF MAGNESIA) 400 MG/5ML suspension Take 30 mLs by mouth daily as needed for mild constipation. 07/12/20   Ailene Ards, NP  metoprolol  succinate (TOPROL-XL) 50 MG 24 hr tablet Take 1 tablet (50 mg total) by mouth daily. Take with or immediately following a meal. 09/15/20   Gosrani, Nimish C, MD  OXYGEN Inhale 2-3 L into the lungs continuous. Hx of lung ca, copd, and emphysema    [provider]  pravastatin (PRAVACHOL) 20 MG tablet Take 1 tablet (20 mg total) by mouth daily. 02/27/21   Doree Albee, MD  senna-docusate (SENOKOT S) 8.6-50 MG tablet Take 1 tablet by mouth 2 (two) times daily as needed for mild constipation or moderate constipation. 07/12/20   Ailene Ards, NP    Allergies    No known allergies  Review of Systems   Review of Systems  Respiratory:  Positive for shortness of breath.   Cardiovascular:  Positive for chest pain.  Gastrointestinal:  Negative for abdominal pain.  Neurological:  Positive for headaches.  All other systems reviewed and are negative.  Physical Exam Updated Vital Signs BP (!) 163/94   Pulse 79   Temp 97.9 F (36.6 C)   Resp (!) 22   Ht 5\' 10"  (1.778 m)   Wt 99.6 kg   SpO2 100%   BMI 31.51 kg/m   Physical Exam Vitals and nursing note reviewed.  Constitutional:      Appearance: He is well-developed.  HENT:     Head: Normocephalic and atraumatic.     Nose: Nose normal. No congestion or rhinorrhea.     Mouth/Throat:     Mouth: Mucous membranes are moist.     Pharynx: Oropharynx is clear.  Eyes:     Pupils: Pupils are equal, round, and reactive to light.  Cardiovascular:     Rate and Rhythm: Tachycardia present.  Pulmonary:     Effort: Pulmonary effort is normal. Tachypnea present. No respiratory distress.     Breath sounds: Decreased air movement present.  Abdominal:     General: There is no distension.  Musculoskeletal:        General: Normal range of motion.     Cervical back: Normal range of motion.  Skin:    General: Skin is warm and dry.  Neurological:     General: No focal deficit present.     Mental Status: He is alert.    ED Results /  Procedures / Treatments   Labs (all labs ordered are listed, but only abnormal results are displayed) Labs Reviewed  COMPREHENSIVE METABOLIC PANEL - Abnormal; Notable for the following components:      Result Value   Sodium 134 (*)    Potassium 3.4 (*)    Glucose, Bld 170 (*)    All other components within normal limits  URINALYSIS, ROUTINE W REFLEX MICROSCOPIC - Abnormal; Notable for the following components:   Glucose, UA 50 (*)    All other components within normal limits  BLOOD GAS, ARTERIAL - Abnormal; Notable for the following components:   pO2, Arterial 70.0 (*)    Acid-Base Excess 2.7 (*)    All other components within normal limits  CBG MONITORING, ED - Abnormal; Notable for the following components:   Glucose-Capillary 192 (*)    All other components within normal limits  RESP PANEL BY RT-PCR (FLU A&B, COVID) ARPGX2  MRSA PCR SCREENING  ETHANOL  PROTIME-INR  APTT  CBC  DIFFERENTIAL  RAPID URINE DRUG SCREEN, HOSP PERFORMED  BRAIN NATRIURETIC PEPTIDE    EKG EKG Interpretation  Date/Time:  Monday March 12 2021 02:15:31 EDT Ventricular Rate:  73 PR Interval:  154 QRS Duration: 98 QT Interval:  402 QTC Calculation: 443 R Axis:   91 Text Interpretation: Sinus rhythm Right axis deviation Minimal ST depression, inferior leads Confirmed by Merrily Pew [phone]) on 03/12/2021 4:00:59 AM  Radiology DG Chest 2 View  Result Date: 03/12/2021 CLINICAL DATA:  Chest pain, shortness of breath EXAM: CHEST - 2 VIEW COMPARISON:  04/06/2019 FINDINGS: Areas of scarring in the upper lobes. Hyperinflation/emphysema. Heart is normal size. No acute confluent opacities or effusions. IMPRESSION: COPD/chronic changes.  No active disease. Electronically Signed   By: Rolm Baptise M.D.   On: 03/12/2021 02:19   CT Head Wo Contrast  Result Date: 03/12/2021 CLINICAL DATA:  Cerebral hemorrhage suspected EXAM: CT HEAD WITHOUT CONTRAST TECHNIQUE: Contiguous axial images were obtained from the base  of the skull through the vertex without intravenous contrast. COMPARISON:  03/07/2018 FINDINGS: Brain: Mild diffuse atrophy. No acute  intracranial abnormality. Specifically, no hemorrhage, hydrocephalus, mass lesion, acute infarction, or significant intracranial injury. Vascular: No hyperdense vessel or unexpected calcification. Skull: No acute calvarial abnormality. Sinuses/Orbits: No acute findings Other: None IMPRESSION: No acute intracranial abnormality. Electronically Signed   By: Rolm Baptise M.D.   On: 03/12/2021 02:48    Procedures .Critical Care  Date/Time: 03/12/2021 4:50 AM Performed by: Merrily Pew, MD Authorized by: Merrily Pew, MD   Critical care provider statement:    Critical care time (minutes):  45   Critical care was necessary to treat or prevent imminent or life-threatening deterioration of the following conditions:  Respiratory failure   Critical care was time spent personally by me on the following activities:  Discussions with consultants, evaluation of patient's response to treatment, examination of patient, ordering and performing treatments and interventions, ordering and review of laboratory studies, ordering and review of radiographic studies, pulse oximetry, re-evaluation of patient's condition, obtaining history from patient or surrogate and review of old charts   Medications Ordered in ED Medications   stroke: mapping our early stages of recovery book (has no administration in time range)  LORazepam (ATIVAN) injection 0.5 mg (has no administration in time range)  enoxaparin (LOVENOX) injection 50 mg (has no administration in time range)  acetaminophen (TYLENOL) tablet 650 mg (has no administration in time range)    Or  acetaminophen (TYLENOL) suppository 650 mg (has no administration in time range)  ondansetron (ZOFRAN) tablet 4 mg (has no administration in time range)    Or  ondansetron (ZOFRAN) injection 4 mg (has no administration in time range)  aspirin  EC tablet 81 mg (has no administration in time range)  albuterol (PROVENTIL) (2.5 MG/3ML) 0.083% nebulizer solution 2.5 mg (has no administration in time range)  senna-docusate (Senokot-S) tablet 1 tablet (has no administration in time range)  pravastatin (PRAVACHOL) tablet 20 mg (has no administration in time range)  magnesium hydroxide (MILK OF MAGNESIA) suspension 30 mL (has no administration in time range)  loratadine (CLARITIN) tablet 10 mg (has no administration in time range)  fenofibrate tablet 160 mg (has no administration in time range)  cholecalciferol (VITAMIN D3) tablet 5,000 Units (has no administration in time range)  insulin aspart (novoLOG) injection 0-15 Units (has no administration in time range)  ipratropium-albuterol (DUONEB) 0.5-2.5 (3) MG/3ML nebulizer solution 3 mL (3 mLs Nebulization Given 03/12/21 0253)    ED Course  I have reviewed the triage vital signs and the nursing notes.  Pertinent labs & imaging results that were available during my care of the patient were reviewed by me and considered in my medical decision making (see chart for details).    MDM Rules/Calculators/A&P                         Will workup for stroke but also will need sob workup.  Suspect copd exacerbation. No current indication for antibiotics.   Tele neuro made recs. Changed up MRI orders. Otherwise will need other orders as suggested.   Final Clinical Impression(s) / ED Diagnoses Final diagnoses:  SOB (shortness of breath)  Right arm weakness    Rx / DC Orders ED Discharge Orders     None        Blayne Garlick, Corene Cornea, MD 03/12/21 [phone]

## 2021-03-12 NOTE — Consult Note (Signed)
[employer] A. Merlene Laughter, MD     [email or web address]          Randall Sullivan is an 70 y.o. male.   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  However, there appears to be also infarct involving the right thalamic region.  The consolation of findings along with the history of paroxysmal atrial fibrillation indicates that the patient most likely has had a cardioembolic infarct.  It is recommended the patient be placed on anticoagulation.  Eliquis is recommended.  I suggest doing this about a week out to minimize the risk of hemorrhagic transformation.  In meantime, full dose aspirin is appropriate.  Physical and occupational therapies will likely needed.  Given the dense right homonymous hemianopia, driving is not recommended until about 3 months and after he has been cleared by an ophthalmologist/optometrist.     This is 70 year old right-handed white male who presents with acute onset of dyspnea and right-sided weakness.  The right-sided weakness may have caused some difficulties ambulating.  He apparently has a history of paroxysmal atrial fibrillation that has been documented a couple times per the daughter who is present at the bedside.  The apparent last time that was clearly documented, was about a year ago.  The patient denies any difficulty speaking or dizziness or headaches.  He does not report having palpitation or chest pain but just the dyspnea.  History is somewhat challenging because of impaired hearing which the daughter reports is severely impaired.  He reports not sleeping well while being hospitalized and is asking for medication.  The review of systems otherwise negative.    GENERAL: On entering the room the patient is awake and talking to his daughter but then became drowsy during the evaluation.  He is on oxygen and slightly dyspneic.  HEENT: Neck is supple no trauma  noted.  Hearing seems to be markedly impaired.  ABDOMEN: soft  EXTREMITIES: No edema   BACK: Normal  SKIN: Normal by inspection.    MENTAL STATUS: Intermittently awake [1].  He does converses and follows commands but seem to have some difficulties comprehending at times.  This seems to be limited by severe hearing impairment.  The patient had difficulties naming all objects at the bedside indicating language impairment [1].  CRANIAL NERVES: Pupils are equal, round and reactive to light and accomodation; extra ocular movements are full, there is no significant nystagmus; visual fields a dense right homonymous hemianopia[2]; upper and lower facial muscles are normal in strength and symmetric, there is no flattening of the nasolabial folds; tongue is midline; uvula is midline; shoulder elevation is normal.  MOTOR: Normal tone, bulk and strength; no pronator drift.  There is mild drift of the right upper extremity but not the right leg [1].  No drift on the left side.    COORDINATION: Left finger to nose is normal, right finger to nose is normal, No rest tremor; no intention tremor; no postural tremor; no bradykinesia.  Negative myoclonus noted involving right upper extremity.  SENSATION: Normal to light touch, temperature, and pain.   NIH stroke scale 1,1,2,1 total 5.  Blood pressure (!) 183/110, pulse 93, temperature 97.7 F (36.5 C), temperature source Oral, resp. rate (!) 27, height 5\' 10"  (1.778 m), weight 100 kg, SpO2 94 %.  Past Medical History:  Diagnosis Date   [facility]) 2017   after last lung surgery patient had a history if Afib  documented in hospital    Allergy    Arthritis    back & legs ( knees)   Cancer (HCC)    lung   Complication of anesthesia    agitated upon waking from anesth.    Emphysema    History of lung cancer    Hyperlipidemia    Hypertension    Oxygen dependent    2L continuous   Tobacco abuse     Past Surgical History:  Procedure Laterality  Date   HERNIA REPAIR Bilateral 8588   umbilical   VIDEO ASSISTED THORACOSCOPY Right 12/21/2018   Procedure: VIDEO ASSISTED THORACOSCOPY AND RESECTION OF BLEBS RIGHT LOWER LOBE;  Surgeon: [name], MD;  Location: [facility];  Service: Thoracic;  Laterality: Right;   VIDEO ASSISTED THORACOSCOPY (VATS)/ LOBECTOMY Left 08/01/2016   Procedure: VIDEO ASSISTED THORACOSCOPY (VATS)/ LEFT UPPER LOBECTOMY with lymph node sampling and onQ placement;  Surgeon: [name], MD;  Location: [facility];  Service: Thoracic;  Laterality: Left;   VIDEO ASSISTED THORACOSCOPY (VATS)/WEDGE RESECTION Right 12/09/2018   Procedure: VIDEO ASSISTED THORACOSCOPY (VATS)/WEDGE RESECTION;  Surgeon: [name], MD;  Location: [facility];  Service: Thoracic;  Laterality: Right;   VIDEO BRONCHOSCOPY Bilateral 12/21/2018   Procedure: VIDEO BRONCHOSCOPY;  Surgeon: [name], MD;  Location: [facility];  Service: Thoracic;  Laterality: Bilateral;   VIDEO BRONCHOSCOPY WITH ENDOBRONCHIAL NAVIGATION N/A 07/03/2016   Procedure: VIDEO BRONCHOSCOPY WITH ENDOBRONCHIAL NAVIGATION;  Surgeon: [name], MD;  Location: [facility];  Service: Thoracic;  Laterality: N/A;   VIDEO BRONCHOSCOPY WITH INSERTION OF INTERBRONCHIAL VALVE (IBV) N/A 12/11/2018   Procedure: VIDEO BRONCHOSCOPY WITH INSERTION OF INTERBRONCHIAL VALVE (IBV);  Surgeon: [name], MD;  Location[facility];  Service: Thoracic;  Laterality: N/A;   VIDEO BRONCHOSCOPY WITH INSERTION OF INTERBRONCHIAL VALVE (IBV) N/A 02/10/2019   Procedure: VIDEO BRONCHOSCOPY WITH REMOVAL OF INTERBRONCHIAL VALVE (IBV);  Surgeon: [name], MD;  Location: [facility] OR;  Service: Thoracic;  Laterality: N/A;    Family History  Problem Relation Age of Onset   Cancer Mother    Cancer Sister    Aneurysm Sister    Kidney disease Paternal Aunt    Asthma Brother    Cancer - Lung Brother    Cancer Brother     Social History:  reports that he quit smoking about 4  years ago. His smoking use included cigarettes. He has a 40.00 pack-year smoking history. He has never used smokeless tobacco. He reports previous alcohol use. He reports that he does not use drugs.  Allergies:  Allergies  Allergen Reactions   No Known Allergies     Medications: Prior to Admission medications   Medication Sig Start Date End Date Taking? Authorizing Provider  acetaminophen (TYLENOL) 500 MG tablet Take 1,000 mg by mouth every 6 (six) hours as needed (for arthritis pain.).    Yes [provider]  albuterol (PROVENTIL) (2.5 MG/3ML) 0.083% nebulizer solution Take 3 mLs (2.5 mg total) by nebulization every 6 (six) hours as needed for wheezing or shortness of breath. 03/07/21  Yes Gosrani, Nimish C, MD  aspirin EC 81 MG tablet Take 81 mg by mouth daily.   Yes [provider]  chlorthalidone (HYGROTON) 25 MG tablet Take 1 tablet (25 mg total) by mouth daily. 09/01/20 03/12/21 Yes Minus Breeding, MD  Cholecalciferol (VITAMIN D3) 125 MCG (5000 UT) CAPS Take 1 capsule by mouth daily.   Yes [provider]  diltiazem (DILT-XR) 120 MG 24  hr capsule Take 1 capsule (120 mg total) by mouth 2 (two) times daily. 03/07/21 04/06/21 Yes Gosrani, Nimish C, MD  fenofibrate (TRICOR) 145 MG tablet Take 1 tablet (145 mg total) by mouth daily. 12/25/20  Yes Gosrani, Nimish C, MD  metoprolol succinate (TOPROL-XL) 50 MG 24 hr tablet Take 1 tablet (50 mg total) by mouth daily. Take with or immediately following a meal. 09/15/20  Yes Gosrani, Nimish C, MD  OXYGEN Inhale 2-3 L into the lungs continuous. Hx of lung ca, copd, and emphysema   Yes [provider]  pravastatin (PRAVACHOL) 20 MG tablet Take 1 tablet (20 mg total) by mouth daily. 02/27/21  Yes Gosrani, Nimish C, MD  senna-docusate (SENOKOT S) 8.6-50 MG tablet Take 1 tablet by mouth 2 (two) times daily as needed for mild constipation or moderate constipation. 07/12/20  Yes Ailene Ards, NP  magnesium hydroxide (MILK OF  MAGNESIA) 400 MG/5ML suspension Take 30 mLs by mouth daily as needed for mild constipation. Patient not taking: Reported on 03/12/2021 07/12/20   Ailene Ards, NP    Scheduled Meds:  aspirin EC  325 mg Oral Daily   cholecalciferol  5,000 Units Oral Daily   enoxaparin (LOVENOX) injection  50 mg Subcutaneous Q24H   fenofibrate  160 mg Oral Daily   insulin aspart  0-15 Units Subcutaneous TID WC   loratadine  10 mg Oral Daily   pravastatin  20 mg Oral Daily   Continuous Infusions: PRN Meds:.acetaminophen **OR** acetaminophen, albuterol, magnesium hydroxide, ondansetron **OR** ondansetron (ZOFRAN) IV, senna-docusate     Results for orders placed or performed during the hospital encounter of 03/12/21 (from the past 48 hour(s))  CBG monitoring, ED     Status: Abnormal   Collection Time: 03/12/21  1:54 AM  Result Value Ref Range   Glucose-Capillary 192 (H) 70 - 99 mg/dL    Comment: Glucose reference range applies only to samples taken after fasting for at least 8 hours.  Resp Panel by RT-PCR (Flu A&B, Covid) Nasopharyngeal Swab     Status: None   Collection Time: 03/12/21  2:15 AM   Specimen: Nasopharyngeal Swab; Nasopharyngeal(NP) swabs in vial transport medium  Result Value Ref Range   SARS Coronavirus 2 by RT PCR NEGATIVE NEGATIVE    Comment: (NOTE) SARS-CoV-2 target nucleic acids are NOT DETECTED.  The SARS-CoV-2 RNA is generally detectable in upper respiratory specimens during the acute phase of infection. The lowest concentration of SARS-CoV-2 viral copies this assay can detect is 138 copies/mL. A negative result does not preclude SARS-Cov-2 infection and should not be used as the sole basis for treatment or other patient management decisions. A negative result may occur with  improper specimen collection/handling, submission of specimen other than nasopharyngeal swab, presence of viral mutation(s) within the areas targeted by this assay, and inadequate number of  viral copies(<138 copies/mL). A negative result must be combined with clinical observations, patient history, and epidemiological information. The expected result is Negative.  Fact Sheet for Patients:  EntrepreneurPulse.com.au  Fact Sheet for Healthcare Providers:  IncredibleEmployment.be  This test is no t yet approved or cleared by the [place] FDA and  has been authorized for detection and/or diagnosis of SARS-CoV-2 by FDA under an Emergency Use Authorization (EUA). This EUA will remain  in effect (meaning this test can be used) for the duration of the COVID-19 declaration under Section 564(b)(1) of the Act, 21 U.S.C.section 360bbb-3(b)(1), unless the authorization is terminated  or revoked sooner.  Influenza A by PCR NEGATIVE NEGATIVE   Influenza B by PCR NEGATIVE NEGATIVE    Comment: (NOTE) The Xpert Xpress SARS-CoV-2/FLU/RSV plus assay is intended as an aid in the diagnosis of influenza from Nasopharyngeal swab specimens and should not be used as a sole basis for treatment. Nasal washings and aspirates are unacceptable for Xpert Xpress SARS-CoV-2/FLU/RSV testing.  Fact Sheet for Patients: EntrepreneurPulse.com.au  Fact Sheet for Healthcare Providers: IncredibleEmployment.be  This test is not yet approved or cleared by the [place] FDA and has been authorized for detection and/or diagnosis of SARS-CoV-2 by FDA under an Emergency Use Authorization (EUA). This EUA will remain in effect (meaning this test can be used) for the duration of the COVID-19 declaration under Section 564(b)(1) of the Act, 21 U.S.C. section 360bbb-3(b)(1), unless the authorization is terminated or revoked.  Performed at [facility], [address] [place], [place] [address]   Ethanol     Status: None   Collection Time: 03/12/21  2:15 AM  Result Value Ref Range   Alcohol, Ethyl (B) <10 <10 mg/dL     Comment: (NOTE) Lowest detectable limit for serum alcohol is 10 mg/dL.  For medical purposes only. Performed at [facility], [address], [place], [place] [address]   Protime-INR     Status: None   Collection Time: 03/12/21  2:15 AM  Result Value Ref Range   Prothrombin Time 12.6 11.4 - 15.2 seconds   INR 0.9 0.8 - 1.2    Comment: (NOTE) INR goal varies based on device and disease states. Performed at [facility], [address], [place], [place] [address]   APTT     Status: None   Collection Time: 03/12/21  2:15 AM  Result Value Ref Range   aPTT 28 24 - 36 seconds    Comment: Performed at [facility], [address] [place], [place] [address]  CBC     Status: None   Collection Time: 03/12/21  2:15 AM  Result Value Ref Range   WBC 6.5 4.0 - 10.5 K/uL   RBC 5.31 4.22 - 5.81 MIL/uL   Hemoglobin 15.3 13.0 - 17.0 g/dL   HCT 46.0 39.0 - 52.0 %   MCV 86.6 80.0 - 100.0 fL   MCH 28.8 26.0 - 34.0 pg   MCHC 33.3 30.0 - 36.0 g/dL   RDW 13.9 11.5 - 15.5 %   Platelets 239 150 - 400 K/uL   nRBC 0.0 0.0 - 0.2 %    Comment: Performed at [facility], [address], [place], [place] [address]  Differential     Status: None   Collection Time: 03/12/21  2:15 AM  Result Value Ref Range   Neutrophils Relative % 60 %   Neutro Abs 3.9 1.7 - 7.7 K/uL   Lymphocytes Relative 25 %   Lymphs Abs 1.7 0.7 - 4.0 K/uL   Monocytes Relative 11 %   Monocytes Absolute 0.7 0.1 - 1.0 K/uL   Eosinophils Relative 2 %   Eosinophils Absolute 0.1 0.0 - 0.5 K/uL   Basophils Relative 1 %   Basophils Absolute 0.1 0.0 - 0.1 K/uL   Immature Granulocytes 1 %   Abs Immature Granulocytes 0.06 0.00 - 0.07 K/uL    Comment: Performed at [facility], [address] [place],  [address]  Comprehensive metabolic panel     Status: Abnormal   Collection Time: 03/12/21  2:15 AM  Result Value Ref Range   Sodium 134 (L) 135 - 145 mmol/L  Potassium 3.4 (L) 3.5 - 5.1 mmol/L   Chloride 98 98 - 111 mmol/L   CO2 29  22 - 32 mmol/L   Glucose, Bld 170 (H) 70 - 99 mg/dL    Comment: Glucose reference range applies only to samples taken after fasting for at least 8 hours.   BUN 19 8 - 23 mg/dL   Creatinine, Ser 1.04 0.61 - 1.24 mg/dL   Calcium 9.2 8.9 - 10.3 mg/dL   Total Protein 7.1 6.5 - 8.1 g/dL   Albumin 4.1 3.5 - 5.0 g/dL   AST 27 15 - 41 U/L   ALT 35 0 - 44 U/L   Alkaline Phosphatase 46 38 - 126 U/L   Total Bilirubin 0.4 0.3 - 1.2 mg/dL   GFR, Estimated >60 >60 mL/min    Comment: (NOTE) Calculated using the CKD-EPI Creatinine Equation (2021)    Anion gap 7 5 - 15    Comment: Performed at [facility], [address] [place], [place] [address]  Brain natriuretic peptide     Status: None   Collection Time: 03/12/21  2:15 AM  Result Value Ref Range   B Natriuretic Peptide 40.0 0.0 - 100.0 pg/mL    Comment: Performed at [facility], [address] [place], [place] [address]  Blood gas, arterial (at [facility])     Status: Abnormal   Collection Time: 03/12/21  4:50 AM  Result Value Ref Range   FIO2 21.00    pH, Arterial 7.394 7.350 - 7.450   pCO2 arterial 45.5 32.0 - 48.0 mmHg   pO2, Arterial 70.0 (L) 83.0 - 108.0 mmHg   Bicarbonate 26.2 20.0 - 28.0 mmol/L   Acid-Base Excess 2.7 (H) 0.0 - 2.0 mmol/L   O2 Saturation 93.1 %   Patient temperature 37.0    Allens test (pass/fail) PASS PASS    Comment: Performed at [facility], [address], [place], [place] [address]  MRSA PCR Screening     Status: None   Collection Time: 03/12/21  6:14 AM   Specimen: Nasal Mucosa; Nasopharyngeal  Result Value Ref Range   MRSA by PCR NEGATIVE NEGATIVE    Comment:        The GeneXpert MRSA Assay (FDA approved for NASAL specimens only), is one component of a comprehensive MRSA colonization surveillance program. It is not intended to diagnose MRSA infection nor to guide or monitor treatment for MRSA infections. Performed at [facility], [address], [place], [place] [address]   Urine rapid drug screen  (hosp performed)     Status: None   Collection Time: 03/12/21  6:20 AM  Result Value Ref Range   Opiates NONE DETECTED NONE DETECTED   Cocaine NONE DETECTED NONE DETECTED   Benzodiazepines NONE DETECTED NONE DETECTED   Amphetamines NONE DETECTED NONE DETECTED   Tetrahydrocannabinol NONE DETECTED NONE DETECTED   Barbiturates NONE DETECTED NONE DETECTED    Comment: (NOTE) DRUG SCREEN FOR MEDICAL PURPOSES ONLY.  IF CONFIRMATION IS NEEDED FOR ANY PURPOSE, NOTIFY LAB WITHIN 5 DAYS.  LOWEST DETECTABLE LIMITS FOR URINE DRUG SCREEN Drug Class                     Cutoff (ng/mL) Amphetamine and metabolites    1000 Barbiturate and metabolites    200 Benzodiazepine                 696 Tricyclics and metabolites     300 Opiates and metabolites  300 Cocaine and metabolites        300 THC                            50 Performed at [facility], [address] [place], [place] [address]   Urinalysis, Routine w reflex microscopic Urine, Clean Catch     Status: Abnormal   Collection Time: 03/12/21  6:20 AM  Result Value Ref Range   Color, Urine YELLOW YELLOW   APPearance CLEAR CLEAR   Specific Gravity, Urine 1.019 1.005 - 1.030   pH 5.0 5.0 - 8.0   Glucose, UA 50 (A) NEGATIVE mg/dL   Hgb urine dipstick NEGATIVE NEGATIVE   Bilirubin Urine NEGATIVE NEGATIVE   Ketones, ur NEGATIVE NEGATIVE mg/dL   Protein, ur NEGATIVE NEGATIVE mg/dL   Nitrite NEGATIVE NEGATIVE   Leukocytes,Ua NEGATIVE NEGATIVE    Comment: Performed at [facility], [address] [place], [place] [address]  Glucose, capillary     Status: Abnormal   Collection Time: 03/12/21  6:51 AM  Result Value Ref Range   Glucose-Capillary 210 (H) 70 - 99 mg/dL    Comment: Glucose reference range applies only to samples taken after fasting for at least 8 hours.  Glucose, capillary     Status: Abnormal   Collection Time: 03/12/21 11:38 AM  Result Value Ref Range   Glucose-Capillary 164 (H) 70 - 99 mg/dL    Comment: Glucose  reference range applies only to samples taken after fasting for at least 8 hours.  Glucose, capillary     Status: Abnormal   Collection Time: 03/12/21  4:28 PM  Result Value Ref Range   Glucose-Capillary 118 (H) 70 - 99 mg/dL    Comment: Glucose reference range applies only to samples taken after fasting for at least 8 hours.    Studies/Results:    BRAIN MRI MRA FINDINGS: MRI HEAD   Brain: There is restricted diffusion in the left PCA territory involving the parasagittal occipital lobe and medial left temporal lobe. Subtle early changes are likely present in retrospect on the prior CT. There is additional involvement of the left thalamus. Minimal involvement of the contralateral right thalamus is also noted. There is no evidence of intracranial hemorrhage.   Prominence of the ventricles and sulci reflects generalized parenchymal volume loss. Additional patchy T2 hyperintensity in the supratentorial white matter is nonspecific but may reflect minor chronic microvascular ischemic changes. 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   There is no intracranial mass or significant mass effect. There is no hydrocephalus or extra-axial fluid collection.   Vascular: Major vessel flow voids at the skull base are preserved.   Skull and upper cervical spine: Normal marrow signal is preserved.   Sinuses/Orbits: Paranasal sinuses are aerated. Orbits are unremarkable.   Other: Sella is unremarkable.  Mastoid air cells are clear.   MRA HEAD   Intracranial internal carotid arteries are patent with atherosclerotic irregularity. Middle and anterior cerebral arteries are patent.   Intracranial vertebral arteries are patent. Basilar artery is patent. Right posterior cerebral artery is patent. Left P1 PCA is patent. There is occlusion of the left PCA at the P1-P2 junction.   No aneurysm.   IMPRESSION: Acute large left PCA territory infarction.  Left PCA occlusion at the P1-P2 junction.   Small acute right thalamic infarct.   No hemorrhage or mass effect.   BRAIN MRI IS REVIEWED IN PERSON.  Increases only as noted on the DWI left occipital low extended to the medial temporal region and parts of the thalami. There is also some signal seen in the right thalamic region. Findings suggest embolic phenomena. There is occlusion of the left proximal PCA. The left M1 segment of the MCA show some luminal irregularities. There is marked global atrophy. There is no hemorrhage or  encephalomalacia noted.    CAROTID IMPRESSION: Bilateral carotid atherosclerosis. No hemodynamically significant ICA stenosis. Degree of narrowing less than 50% bilaterally by ultrasound criteria.   Patent antegrade vertebral flow bilaterally    Satya Bohall A. Merlene Laughter, M.D.  Diplomate, Tax adviser of Psychiatry and Neurology ( Neurology). 03/12/2021, 5:21 PM

## 2021-03-12 NOTE — Progress Notes (Signed)
Kadden Osterhout is a 70 y.o. male with medical history significant of paroxysmal atrial fibrillation, osteoarthritis, seasonal allergies, lung cancer, emphysema on home oxygen at 2 LPM, active smoker, hyperlipidemia, hypertension who was LKW  around 2000 on 03/11/2021 when he went to sleep and woke up around 0100 on 03/12/2021 with complaints of pressure-like nonradiating chest pain and shortness of breath.  He was noted to have right-sided weakness and was admitted for further assessment of CVA.  CT head with no acute findings initially, but brain MRI demonstrating large territory PCA infarct with occlusion of PCA.  He has been started on aspirin and is on permissive hypertension.  Further neurology evaluation pending.  PT recommending CIR.  Discussed with wife at bedside on 6/13.  Total care time: 35 minutes.

## 2021-03-12 NOTE — Progress Notes (Signed)
SLP Cancellation Note  Patient Details Name: Randall Sullivan MRN: [id number] DOB: Jan 09, 1951   Cancelled treatment:       Reason Eval/Treat Not Completed: Other (comment); Pt received Ativan for MRI and is slow to respond at this time. Pt's wife indicates that he is primarily having difficulty with vision at this time. SLP will check back later this afternoon for SLE.   Thank you,  Genene Churn, [facility] 03/12/2021, 9:45 AM

## 2021-03-12 NOTE — ED Notes (Signed)
o2 sat 100% at 4lpm

## 2021-03-12 NOTE — Plan of Care (Addendum)
  Problem: Acute Rehab PT Goals(only PT should resolve) Goal: Pt Will Go Supine/Side To Sit Outcome: Progressing Flowsheets (Taken 03/12/2021 1513) Pt will go Supine/Side to Sit: Independently Goal: Patient Will Perform Sitting Balance Outcome: Progressing Flowsheets (Taken 03/12/2021 1513) Patient will perform sitting balance:  1-2 min  with supervision  with no UE support Goal: Patient Will Transfer Sit To/From Stand Outcome: Progressing Flowsheets (Taken 03/12/2021 1513) Patient will transfer sit to/from stand: with supervision Goal: Pt Will Transfer Bed To Chair/Chair To Bed Outcome: Progressing Flowsheets (Taken 03/12/2021 1513) Pt will Transfer Bed to Chair/Chair to Bed:  min guard assist  with supervision Goal: Pt Will Ambulate Outcome: Progressing Flowsheets (Taken 03/12/2021 1513) Pt will Ambulate:  50 feet  with rolling walker  with minimal assist  with min guard assist   3:14 PM, 03/12/21 Jeneen Rinks Cousler SPT  3:19 PM, 03/12/21 Lonell Grandchild, MPT Physical Therapist with [facility] 336 [phone] office [phone] mobile phone

## 2021-03-12 NOTE — Telephone Encounter (Signed)
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

## 2021-03-12 NOTE — Telephone Encounter (Signed)
Patients wife Randall Haw called and wanted to let you know that patient is in the hospital and she thinks he had a stroke and started about 1am when she noticed symptoms.  Randall Sullivan DOB 04/24/1964 asked if she can have a note for work for this week because she has been up with her husband and wanted to know if you are able to write her a note for work so she can be with her husband?

## 2021-03-12 NOTE — ED Triage Notes (Signed)
Pt arrived EMS c/o chest pain and shortness of breath. EMS concerned for CVA. LKN 03/11/21 @ 2000 prior to going to bed.

## 2021-03-12 NOTE — Telephone Encounter (Signed)
Wife is not a patient here, she is having to stay with her husband until they giver her further instructions once testing is completed.

## 2021-03-12 NOTE — H&P (Signed)
History and Physical    Randall Sullivan IDP:[id number] DOB: 28-Feb-1951 DOA: 03/12/2021  PCP: Doree Albee, MD   Patient coming from: Home.  I have personally briefly reviewed patient's old medical records in [place]  Chief Complaint: Right-sided weakness.  HPI: Randall Sullivan is a 70 y.o. male with medical history significant of paroxysmal atrial fibrillation, osteoarthritis, seasonal allergies, lung cancer, emphysema on home oxygen at 2 LPM, active smoker, hyperlipidemia, hypertension who was LKW  around 2000 on 03/11/2021 when he went to sleep and woke up around 0100 on 03/12/2021 with complaints of pressure-like nonradiating chest pain and shortness of breath.  No palpitations or orthopnea.  No recent lower extremity edema.  His wife stated he seemed a little confused initially, but this subsequently improved.  No aphasia or dysarthria.  No trouble comprehending language.  No headaches, nausea, blurred vision or lightheadedness.  However, the patient was clearly weaker on the right side, which was noticed by EMS but it improved in route to the hospital.  No fever, chills, sore throat, rhinorrhea, hemoptysis, but positive dyspnea and wheezing.  He has occasionally constipated, but otherwise denies abdominal pain, diarrhea, melena or hematochezia.  No dysuria, frequency or hematuria.  No polyuria, polydipsia, polyphagia or blurred vision.  ED Course: Initial vital signs were temperature 97.9 F, pulse 79, respirations 24, BP 150/89 mmHg O2 sat 100% on 4 LPM.  The patient received DuoNeb in the ED.  Lab work: UDS was negative.  Urinalysis showed glucosuria of 50 mg/dL, but was otherwise negative.  ABG showed PO2 of 17.0 mmHg and acid base excess of 2.7 mmol/L.  The rest of the values were normal. CBC and coagulation panel negative.  Normal atrial alcohol and BNP.  CMP showed a sodium of 134 and potassium of 3.4 mmol/L.  Glucose 170 mg/dL.  The rest of the CMP was normal.  Imaging: His chest  radiograph showed only chronic changes.  CT head did not show any acute intracranial abnormality.  Please see images and radiology report for further detail.  Review of Systems: As per HPI otherwise all other systems reviewed and are negative.  Past Medical History:  Diagnosis Date   A-fib ([place]) 2017   after last lung surgery patient had a history if Afib documented in hospital    Allergy    Arthritis    back & legs ( knees)   Cancer ([place])    lung   Complication of anesthesia    agitated upon waking from anesth.    Emphysema    History of lung cancer    Hyperlipidemia    Hypertension    Oxygen dependent    2L continuous   Tobacco abuse     Past Surgical History:  Procedure Laterality Date   HERNIA REPAIR Bilateral 4431   umbilical   VIDEO ASSISTED THORACOSCOPY Right 12/21/2018   Procedure: VIDEO ASSISTED THORACOSCOPY AND RESECTION OF BLEBS RIGHT LOWER LOBE;  Surgeon: [name], MD;  Location: [facility];  Service: Thoracic;  Laterality: Right;   VIDEO ASSISTED THORACOSCOPY (VATS)/ LOBECTOMY Left 08/01/2016   Procedure: VIDEO ASSISTED THORACOSCOPY (VATS)/ LEFT UPPER LOBECTOMY with lymph node sampling and onQ placement;  Surgeon: [name], MD;  Location: [facility];  Service: Thoracic;  Laterality: Left;   VIDEO ASSISTED THORACOSCOPY (VATS)/WEDGE RESECTION Right 12/09/2018   Procedure: VIDEO ASSISTED THORACOSCOPY (VATS)/WEDGE RESECTION;  Surgeon: [name], MD;  Location: [facility] OR;  Service: Thoracic;  Laterality: Right;   VIDEO BRONCHOSCOPY Bilateral 12/21/2018  Procedure: VIDEO BRONCHOSCOPY;  Surgeon: [name], MD;  Location: [facility];  Service: Thoracic;  Laterality: Bilateral;   VIDEO BRONCHOSCOPY WITH ENDOBRONCHIAL NAVIGATION N/A 07/03/2016   Procedure: VIDEO BRONCHOSCOPY WITH ENDOBRONCHIAL NAVIGATION;  Surgeon: [name], MD;  Location: [facility];  Service: Thoracic;  Laterality: N/A;   VIDEO BRONCHOSCOPY WITH INSERTION OF INTERBRONCHIAL  VALVE (IBV) N/A 12/11/2018   Procedure: VIDEO BRONCHOSCOPY WITH INSERTION OF INTERBRONCHIAL VALVE (IBV);  Surgeon: [name], MD;  Location: [facility] OR;  Service: Thoracic;  Laterality: N/A;   VIDEO BRONCHOSCOPY WITH INSERTION OF INTERBRONCHIAL VALVE (IBV) N/A 02/10/2019   Procedure: VIDEO BRONCHOSCOPY WITH REMOVAL OF INTERBRONCHIAL VALVE (IBV);  Surgeon: [name], MD;  Location: [facility] OR;  Service: Thoracic;  Laterality: N/A;   Social History  reports that he quit smoking about 4 years ago. His smoking use included cigarettes. He has a 40.00 pack-year smoking history. He has never used smokeless tobacco. He reports previous alcohol use. He reports that he does not use drugs.  Allergies  Allergen Reactions   No Known Allergies    Family History  Problem Relation Age of Onset   Cancer Mother    Cancer Sister    Aneurysm Sister    Kidney disease Paternal Aunt    Asthma Brother    Cancer - Lung Brother    Cancer Brother    Prior to Admission medications   Medication Sig Start Date End Date Taking? Authorizing Provider  acetaminophen (TYLENOL) 500 MG tablet Take 1,000 mg by mouth every 6 (six) hours as needed (for arthritis pain.).     [provider]  albuterol (PROVENTIL) (2.5 MG/3ML) 0.083% nebulizer solution Take 3 mLs (2.5 mg total) by nebulization every 6 (six) hours as needed for wheezing or shortness of breath. 03/07/21   Doree Albee, MD  aspirin EC 81 MG tablet Take 81 mg by mouth daily.    [provider]  chlorthalidone (HYGROTON) 25 MG tablet Take 1 tablet (25 mg total) by mouth daily. 09/01/20 11/30/20  Minus Breeding, MD  Cholecalciferol (VITAMIN D3) 125 MCG (5000 UT) CAPS Take 1 capsule by mouth daily.    [provider]  diltiazem (CARDIZEM SR) 120 MG 12 hr capsule Take 1 capsule (120 mg total) by mouth 2 (two) times daily. New dose 11/03/20   Gosrani, Nimish C, MD  diltiazem (DILT-XR) 120 MG 24 hr capsule Take 1 capsule (120 mg  total) by mouth 2 (two) times daily. 03/07/21 04/06/21  Doree Albee, MD  fenofibrate (TRICOR) 145 MG tablet Take 1 tablet (145 mg total) by mouth daily. 12/25/20   Doree Albee, MD  fexofenadine (ALLEGRA) 180 MG tablet Take 180 mg by mouth daily as needed for allergies or rhinitis.    [provider]  magnesium hydroxide (MILK OF MAGNESIA) 400 MG/5ML suspension Take 30 mLs by mouth daily as needed for mild constipation. 07/12/20   Ailene Ards, NP  metoprolol succinate (TOPROL-XL) 50 MG 24 hr tablet Take 1 tablet (50 mg total) by mouth daily. Take with or immediately following a meal. 09/15/20   Gosrani, Nimish C, MD  OXYGEN Inhale 2-3 L into the lungs continuous. Hx of lung ca, copd, and emphysema    [provider]  pravastatin (PRAVACHOL) 20 MG tablet Take 1 tablet (20 mg total) by mouth daily. 02/27/21   Doree Albee, MD  senna-docusate (SENOKOT S) 8.6-50 MG tablet Take 1 tablet by mouth 2 (two) times daily as needed  for mild constipation or moderate constipation. 07/12/20   Ailene Ards, NP    Physical Exam: Vitals:   03/12/21 0345 03/12/21 0400 03/12/21 0415 03/12/21 0430  BP: (!) 153/95 (!) 157/84 (!) 157/89 (!) 143/86  Pulse:  71 77 72  Resp: 16 17 18    Temp:      SpO2: 93% 93% 97% 95%  Weight:      Height:       Constitutional: NAD, calm, comfortable Eyes: PERRL, lids and conjunctivae normal ENMT: Mucous membranes are moist. Posterior pharynx clear of any exudate or lesions. Neck: normal, supple, no masses, no thyromegaly Respiratory: Mild bilateral wheezing, no crackles. Normal respiratory effort. No accessory muscle use.  Cardiovascular: Regular rate and rhythm, no murmurs / rubs / gallops. No extremity edema. 2+ pedal pulses. No carotid bruits.  Abdomen: Obese, no distention.  Bowel sounds positive.  Soft, no tenderness, no masses palpated. No hepatosplenomegaly.  Musculoskeletal: no clubbing / cyanosis. Good ROM, no contractures. Normal muscle  tone.  Skin: no rashes, lesions, ulcers on very limited mobility contamination. Neurologic: CN 2-12 grossly intact. Sensation intact, DTR normal.  Right side extremities pronator drift with 4/5 strength. Psychiatric: Alert and oriented x 3. Normal mood.   Labs on Admission: I have personally reviewed following labs and imaging studies  CBC: Recent Labs  Lab 03/12/21 0215  WBC 6.5  NEUTROABS 3.9  HGB 15.3  HCT 46.0  MCV 86.6  PLT 301   Basic Metabolic Panel: Recent Labs  Lab 03/12/21 0215  NA 134*  K 3.4*  CL 98  CO2 29  GLUCOSE 170*  BUN 19  CREATININE 1.04  CALCIUM 9.2   GFR: Estimated Creatinine Clearance: 77 mL/min (by C-G formula based on SCr of 1.04 mg/dL).  Liver Function Tests: Recent Labs  Lab 03/12/21 0215  AST 27  ALT 35  ALKPHOS 46  BILITOT 0.4  PROT 7.1  ALBUMIN 4.1   Radiological Exams on Admission: DG Chest 2 View  Result Date: 03/12/2021 CLINICAL DATA:  Chest pain, shortness of breath EXAM: CHEST - 2 VIEW COMPARISON:  04/06/2019 FINDINGS: Areas of scarring in the upper lobes. Hyperinflation/emphysema. Heart is normal size. No acute confluent opacities or effusions. IMPRESSION: COPD/chronic changes.  No active disease. Electronically Signed   By: Rolm Baptise M.D.   On: 03/12/2021 02:19   CT Head Wo Contrast  Result Date: 03/12/2021 CLINICAL DATA:  Cerebral hemorrhage suspected EXAM: CT HEAD WITHOUT CONTRAST TECHNIQUE: Contiguous axial images were obtained from the base of the skull through the vertex without intravenous contrast. COMPARISON:  03/07/2018 FINDINGS: Brain: Mild diffuse atrophy. No acute intracranial abnormality. Specifically, no hemorrhage, hydrocephalus, mass lesion, acute infarction, or significant intracranial injury. Vascular: No hyperdense vessel or unexpected calcification. Skull: No acute calvarial abnormality. Sinuses/Orbits: No acute findings Other: None IMPRESSION: No acute intracranial abnormality. Electronically Signed   By:  Rolm Baptise M.D.   On: 03/12/2021 02:48    EKG: Independently reviewed.  Vent. rate 73 BPM PR interval 154 ms QRS duration 98 ms QT/QTcB 402/443 ms P-R-T axes 88 91 73' Sinus rhythm Right axis deviation Minimal ST depression, inferior leads  Assessment/Plan Principal Problem:   Right sided weakness Observation/telemetry. Frequent neurochecks. Swallow screen. Consult PT/OT. Fasting lipids and hemoglobin A1c. Carotid Doppler and echocardiogram. Obtain brain MRI. Continue aspirin. IP consult for neurology placed.  Active Problems:   Essential (primary) hypertension Permissive hypertension. Hold beta and calcium channel blockers. Hold chlorthalidone. Monitor blood pressure and heart rate.  PAF (paroxysmal atrial fibrillation) (HCC) CHA?DS?-VASc Score pre-event was 3 Currently in NSR. He was not on anticoagulation. Cardizem and metoprolol held (permissive hypertension).    Hyperlipidemia   Aortic atherosclerosis (HCC) Continue fenofibrate and pravastatin.    COPD with acute exacerbation (HCC) Continue supplemental oxygen. Bronchodilators as needed.    Hypokalemia Replacing. Follow-up potassium level.     DVT prophylaxis: Lovenox SQ. Code Status:   Full code. Family Communication:  His spouse was present in the ED room. Disposition Plan:   Patient is from:  Home.  Anticipated DC to:  Home.  Anticipated DC date:  03/13/2021 or 03/14/2021.  Anticipated DC barriers: Clinical status and pending work-up.  Consults called:  Routine neurology consult. Admission status:  Observation/telemetry.  Severity of Illness:  High severity after presenting with new onset right-sided weakness.  The patient will need to be admitted for CVA work-up.  Reubin Milan MD [facility]  How to contact the [facility] Attending or Consulting provider [place] or covering provider during after hours [place], for this patient?   Check the care team in [facility] and look for a)  attending/consulting [facility] provider listed and b) the [facility] team listed Log into [email or web address] and use [place]'s universal password to access. If you do not have the password, please contact the hospital operator. Locate the [facility] provider you are looking for under [facility] and page to a number that you can be directly reached. If you still have difficulty reaching the provider, please page the [facility] (Director on Call) for the Hospitalists listed on amion for assistance.  03/12/2021, 5:29 AM   This document was prepared using Dragon voice recognition software and may contain some unintended transcription errors.

## 2021-03-12 NOTE — Consult Note (Addendum)
[facility] TeleNeurology Consult Services  Stat Consult  Date of Service:   03/12/2021 05:19:20  Diagnosis:       R53.1 - Weakness  Impression: 70 yo RH M with h/o afib on ASA 81 (previously on warfarin but felt not to need it anymore), HTN, HLD, emphysema on home PRN oxygen, prior lung cancer with no known mets, presenting after calling 911 for SOB at home. EMS noted pt was weak on the R side. Woke with these symptoms. Went to bed at 20:00 last night. On exam he has R arm/leg weakness and is disoriented. Recommend MRI brain and stroke workup. MRI ideally WWO contrast given history of cancer. Discussed with primary provider by phone.  CT HEAD: Showed No Acute Hemorrhage or Acute Core Infarct Reviewed No acute intracranial abnormality  Our recommendations are outlined below.  Diagnostic Studies: Recommend MRI brain with and without contrast Routine MRA head without contrast and carotid ultrasound are ordered Transthoracic Echo with bubble study, if available  Laboratory Studies: Recommend Lipid panel Hemoglobin A1c  Medication: Initiate Aspirin 325 mg daily Statins for LDL goal less than 70 Permissive hypertension, Antihypertensives with prn for first 24-48 hrs post stroke onset. If BP greater than 220/120 give Labetalol IV or Vasotec IV  Nursing Recommendations: Telemetry, IV Fluids, avoid dextrose containing fluids, Maintain euglycemia Neuro checks q4 hrs x 24 hrs and then per shift Head of bed 30 degrees  Consultations: Recommend Speech therapy if failed dysphagia screen Physical therapy/Occupational therapy  DVT Prophylaxis: Choice of Primary Team  Disposition: Neurology will follow   Metrics: [facility] Notification Time: 03/12/2021 05:18:03 Stamp Time: 03/12/2021 05:19:20 Callback Response Time: 03/12/2021 05:24:50   ----------------------------------------------------------------------------------------------------  Chief  Complaint: R sided weakness  History of Present Illness: Patient is a 70 year old Male.  70 yo RH M with h/o afib on ASA 40 (previously on warfarin but felt not to need it anymore), HTN, HLD, emphysema on home PRN oxygen, prior lung cancer with no known mets, presenting after calling 911 for SOB at home. EMS noted pt was weak on the R side. Woke with these symptoms. Went to bed at 20:00 last night. He thinks the SOB woke him out of sleep. He woke and tried to stand and said he didn't know what was happening and what was going on - didn't feel right. Never had anything like this happen before.    Past Medical History:      Hypertension      Hyperlipidemia      Atrial Fibrillation      There is NO history of Coronary Artery Disease      There is NO history of Stroke      emphysema, prior lung cancer  Anticoagulant use:  No  Antiplatelet use: Yes ASA 81    Examination: BP(160/94), Pulse(77), Blood Glucose(192) 1A: Level of Consciousness - Alert; keenly responsive + 0 1B: Ask Month and Age - Could Not Answer Either Question Correctly + 2 1C: Blink Eyes & Squeeze Hands - Performs Both Tasks + 0 2: Test Horizontal Extraocular Movements - Normal + 0 3: Test Visual Fields - No Visual Loss + 0 4: Test Facial Palsy (Use Grimace if Obtunded) - Normal symmetry + 0 5A: Test Left Arm Motor Drift - No Drift for 10 Seconds + 0 5B: Test Right Arm Motor Drift - Drift, but doesn't hit bed + 1 6A: Test Left Leg Motor Drift - No Drift for 5 Seconds + 0 6B: Test Right Leg Motor  Drift - Drift, but doesn't hit bed + 1 7: Test Limb Ataxia (FNF/Heel-Shin) - No Ataxia + 0 8: Test Sensation - Normal; No sensory loss + 0 9: Test Language/Aphasia - Normal; No aphasia + 0 10: Test Dysarthria - Normal + 0 11: Test Extinction/Inattention - No abnormality + 0  NIHSS Score: 4  NIHSS Text :  Michela Pitcher age is 70 and month is April or something     Patient / Family was informed the Neurology Consult would occur  via TeleHealth consult by way of interactive audio and video telecommunications and consented to receiving care in this manner.  Patient is being evaluated for possible acute neurologic impairment and high probability of imminent or life - threatening deterioration.I spent total of 20 minutes providing care to this patient, including time for face to face visit via telemedicine, review of medical records, imaging studies and discussion of findings with providers, the patient and / or family.   Dr Raymond Gurney   [facility] [phone]  Case [id number]

## 2021-03-12 NOTE — TOC Initial Note (Signed)
PT is recommending CIR. CSW reached out to Jhonnie Garner with [facility] to inform of PT recommendation.

## 2021-03-12 NOTE — Progress Notes (Signed)
Initial Nutrition Assessment  DOCUMENTATION CODES:   Obesity unspecified  INTERVENTION:  Heart Healthy diet education   Weight loss Tips  NUTRITION DIAGNOSIS:   Food and nutrition related knowledge deficit related to limited prior education as evidenced by  (per chart review).   GOAL:  Provide needs based on ASPEN/SCCM guidelines   MONITOR:  I & O's, Labs  REASON FOR ASSESSMENT:   Consult Assessment of nutrition requirement/status  ASSESSMENT:  Patient is a 70 yo male with hx of lung cancer, emphysema, HLD, HTN. Presents complaining of chest pain, right sided weakness, and shortness of breath. Stroke work up in progress.  MRI pending. Neurology consulted.   Patient able to ambulate with PT - 25' today.   Patient diet fully advanced. No intake documented at this time.   Weight history stable between 98-102 kg.the past year.   Intake/Output Summary (Last 24 hours) at 03/12/2021 1555 Last data filed at 03/12/2021 0648 Gross per 24 hour  Intake 240 ml  Output --  Net 240 ml    Medications: Vitamin D3, Insulin.   Labs: BMP Latest Ref Rng & Units 03/12/2021 12/01/2020 06/02/2020  Glucose 70 - 99 mg/dL 170(H) 128(H) 95  BUN 8 - 23 mg/dL 19 22 15   Creatinine 0.61 - 1.24 mg/dL 1.04 1.37(H) 1.26(H)  BUN/Creat Ratio 6 - 22 (calc) - - -  Sodium 135 - 145 mmol/L 134(L) 138 138  Potassium 3.5 - 5.1 mmol/L 3.4(L) 4.0 4.6  Chloride 98 - 111 mmol/L 98 100 102  CO2 22 - 32 mmol/L 29 29 28   Calcium 8.9 - 10.3 mg/dL 9.2 9.4 9.6     NUTRITION - FOCUSED PHYSICAL EXAM:  Unable to complete Nutrition-Focused physical exam at this time.    Diet Order:   Diet Order             Diet Heart Room service appropriate? Yes; Fluid consistency: Thin  Diet effective now                   EDUCATION NEEDS:   Education needs have been addressed  Skin:  Skin Assessment: Reviewed RN Assessment  Last BM:  6/12  Height:   Ht Readings from Last 1 Encounters:  03/12/21 5\' 10"   (1.778 m)    Weight:   Wt Readings from Last 1 Encounters:  03/12/21 100 kg    Ideal Body Weight:   75 kg  BMI:  Body mass index is 31.63 kg/m.  Estimated Nutritional Needs:   Kcal:  1300-1500  Protein:  150 gr  Fluid:  > 2 liters daily   Colman Cater MS,RD,CSG,LDN Contact: Shea Evans

## 2021-03-13 ENCOUNTER — Other Ambulatory Visit (HOSPITAL_COMMUNITY): Payer: Medicare Other

## 2021-03-13 ENCOUNTER — Encounter (INDEPENDENT_AMBULATORY_CARE_PROVIDER_SITE_OTHER): Payer: Self-pay | Admitting: Internal Medicine

## 2021-03-13 ENCOUNTER — Other Ambulatory Visit (HOSPITAL_COMMUNITY): Payer: Self-pay | Admitting: *Deleted

## 2021-03-13 ENCOUNTER — Inpatient Hospital Stay (HOSPITAL_COMMUNITY): Payer: Medicare Other

## 2021-03-13 DIAGNOSIS — I6389 Other cerebral infarction: Secondary | ICD-10-CM

## 2021-03-13 LAB — ECHOCARDIOGRAM COMPLETE BUBBLE STUDY
AR max vel: 3.27 cm2
AV Area VTI: 3.42 cm2
AV Area mean vel: 3.08 cm2
AV Mean grad: 2.8 mmHg
AV Peak grad: 5.5 mmHg
Ao pk vel: 1.18 m/s
Area-P 1/2: 4.33 cm2
S' Lateral: 3.2 cm

## 2021-03-13 LAB — BLOOD GAS, VENOUS
Acid-Base Excess: 7.8 mmol/L — ABNORMAL HIGH (ref 0.0–2.0)
Bicarbonate: 28.9 mmol/L — ABNORMAL HIGH (ref 20.0–28.0)
FIO2: 26
O2 Saturation: 52.4 %
Patient temperature: 36.8
pCO2, Ven: 54.7 mmHg (ref 44.0–60.0)
pH, Ven: 7.395 (ref 7.250–7.430)
pO2, Ven: 31.5 mmHg — CL (ref 32.0–45.0)

## 2021-03-13 LAB — HEMOGLOBIN A1C
Hgb A1c MFr Bld: 7.5 % — ABNORMAL HIGH (ref 4.8–5.6)
Mean Plasma Glucose: 169 mg/dL

## 2021-03-13 LAB — BASIC METABOLIC PANEL
Anion gap: 9 (ref 5–15)
BUN: 19 mg/dL (ref 8–23)
CO2: 29 mmol/L (ref 22–32)
Calcium: 9.6 mg/dL (ref 8.9–10.3)
Chloride: 97 mmol/L — ABNORMAL LOW (ref 98–111)
Creatinine, Ser: 1.09 mg/dL (ref 0.61–1.24)
GFR, Estimated: >60 mL/min (ref >60)
Glucose, Bld: 129 mg/dL — ABNORMAL HIGH (ref 70–99)
Potassium: 3.6 mmol/L (ref 3.5–5.1)
Sodium: 135 mmol/L (ref 135–145)

## 2021-03-13 LAB — CBC
HCT: 46.8 % (ref 39.0–52.0)
Hemoglobin: 14.9 g/dL (ref 13.0–17.0)
MCH: 27.7 pg (ref 26.0–34.0)
MCHC: 31.8 g/dL (ref 30.0–36.0)
MCV: 87 fL (ref 80.0–100.0)
Platelets: 216 10*3/uL (ref 150–400)
RBC: 5.38 MIL/uL (ref 4.22–5.81)
RDW: 14.1 % (ref 11.5–15.5)
WBC: 10.6 10*3/uL — ABNORMAL HIGH (ref 4.0–10.5)
nRBC: 0 % (ref 0.0–0.2)

## 2021-03-13 LAB — GLUCOSE, CAPILLARY
Glucose-Capillary: 178 mg/dL — ABNORMAL HIGH (ref 70–99)
Glucose-Capillary: 135 mg/dL — ABNORMAL HIGH (ref 70–99)
Glucose-Capillary: 118 mg/dL — ABNORMAL HIGH (ref 70–99)
Glucose-Capillary: 148 mg/dL — ABNORMAL HIGH (ref 70–99)

## 2021-03-13 LAB — LDL CHOLESTEROL, DIRECT: Direct LDL: 75.9 mg/dL (ref 0–99)

## 2021-03-13 LAB — MAGNESIUM: Magnesium: 1.8 mg/dL (ref 1.7–2.4)

## 2021-03-13 MED ORDER — METOPROLOL SUCCINATE ER 50 MG PO TB24
50.0000 mg | ORAL_TABLET | Freq: Every day | ORAL | Status: DC
  Administered 2021-03-13 – 2021-03-14 (×2): 50 mg via ORAL
  Filled 2021-03-13 (×2): qty 1

## 2021-03-13 MED ORDER — DILTIAZEM HCL-DEXTROSE 125-5 MG/125ML-% IV SOLN (PREMIX)
5.0000 mg/h | INTRAVENOUS | Status: DC
  Filled 2021-03-13 (×2): qty 125

## 2021-03-13 MED ORDER — ALBUTEROL SULFATE (2.5 MG/3ML) 0.083% IN NEBU: 2.5000 mg | INHALATION_SOLUTION | Freq: Two times a day (BID) | RESPIRATORY_TRACT | Status: DC

## 2021-03-13 MED ORDER — ALBUTEROL SULFATE (2.5 MG/3ML) 0.083% IN NEBU
2.5000 mg | INHALATION_SOLUTION | Freq: Two times a day (BID) | RESPIRATORY_TRACT | Status: DC
  Administered 2021-03-13 – 2021-03-14 (×2): 2.5 mg via RESPIRATORY_TRACT
  Filled 2021-03-13 (×3): qty 3

## 2021-03-13 MED ORDER — LABETALOL HCL 5 MG/ML IV SOLN: 10.0000 mg | INTRAVENOUS | Status: DC | PRN

## 2021-03-13 MED ORDER — DILTIAZEM HCL 25 MG/5ML IV SOLN
10.0000 mg | Freq: Once | INTRAVENOUS | Status: AC
  Administered 2021-03-13: 10 mg via INTRAVENOUS
  Filled 2021-03-13: qty 5

## 2021-03-13 MED ORDER — DILTIAZEM HCL ER 120 MG PO CP24
120.0000 mg | ORAL_CAPSULE | Freq: Two times a day (BID) | ORAL | Status: DC
  Administered 2021-03-13 – 2021-03-14 (×2): 120 mg via ORAL
  Filled 2021-03-13 (×8): qty 1

## 2021-03-13 NOTE — Progress Notes (Signed)
*  PRELIMINARY RESULTS* Echocardiogram 2D Echocardiogram has been performed with Saline bubble study.  Samuel Germany 03/13/2021, 4:29 PM

## 2021-03-13 NOTE — Progress Notes (Signed)
Wife taking phone home.

## 2021-03-13 NOTE — Progress Notes (Signed)
PROGRESS NOTE    Randall Sullivan  YNW:[id number] DOB: 16-May-1951 DOA: 03/12/2021 PCP: Doree Albee, MD   Brief Narrative:   Randall Sullivan is a 70 y.o. male with medical history significant of paroxysmal atrial fibrillation, osteoarthritis, seasonal allergies, lung cancer, emphysema on home oxygen at 2 LPM, active smoker, hyperlipidemia, hypertension who was LKW  around 2000 on 03/11/2021 when he went to sleep and woke up around 0100 on 03/12/2021 with complaints of pressure-like nonradiating chest pain and shortness of breath.  He was noted to have right-sided weakness and was admitted for further assessment of CVA.  CT head with no acute findings initially, but brain MRI demonstrating large territory PCA infarct with occlusion of PCA.  He has been started on full dose aspirin and PT is recommending CIR placement.  Assessment & Plan:   Principal Problem:   Right sided weakness Active Problems:   Essential (primary) hypertension   PAF (paroxysmal atrial fibrillation) (HCC)   Hyperlipidemia   Aortic atherosclerosis (HCC)   COPD with acute exacerbation (HCC)   Hypokalemia   Hyperglycemia   CVA (cerebral vascular accident) ([place])   Acute left PCA infarct -Potentially thromboembolic from paroxysmal atrial fibrillation -Appreciate neurology consultation with recommendation for Eliquis to start on 6/20 -Continue full dose aspirin -Continue statin with significantly high LDL levels based on triglyceride elevation -Hemoglobin A1c 7.5%, continue SSI for now -Plan to obtain 2D echocardiogram -Carotid ultrasound with greater than 50% atherosclerosis bilaterally  Paroxysmal atrial fibrillation, currently sinus rhythm -Plan to resume Cardizem and metoprolol -Started on Eliquis by 6/20 -Continue telemetry monitoring  New onset type 2 diabetes -Hemoglobin A1c 7.5% -Continue SSI for now and consider metformin on discharge  History of emphysema with chronic hypoxemia -Active smoker and wears 2 L  nasal cannula at home -Currently without exacerbations  Dyslipidemia -Poorly controlled -Continue statin  History of hypertension -Resume Cardizem and beta-blocker -Hold chlorthalidone for now -As needed labetalol for significant elevations  DVT prophylaxis: Lovenox Code Status: Full Family Communication: Wife, [name] at bedside 6/14 Disposition Plan:  Status is: Inpatient  Remains inpatient appropriate because:Unsafe d/c plan and Inpatient level of care appropriate due to severity of illness  Dispo: The patient is from: Home              Anticipated d/c is to: CIR              Patient currently is medically stable to d/c.   Difficult to place patient Yes  Procedures:  See below  Antimicrobials:  None   Subjective: Patient seen and evaluated today with no new acute complaints or concerns. No acute concerns or events noted overnight.  He is noted to be somnolent this morning with wife at bedside.  Objective: Vitals:   03/13/21 0700 03/13/21 0714 03/13/21 1000 03/13/21 1120  BP: 120/75  114/83   Pulse:   (!) 110 (!) 103  Resp:      Temp:  98.8 F (37.1 C)  98.1 F (36.7 C)  TempSrc:  Oral  Oral  SpO2:   99% 100%  Weight:      Height:        Intake/Output Summary (Last 24 hours) at 03/13/2021 1152 Last data filed at 03/13/2021 1000 Gross per 24 hour  Intake 390 ml  Output 1100 ml  Net -710 ml   Filed Weights   03/12/21 0202 03/12/21 0648  Weight: 99.6 kg 100 kg    Examination:  General exam: Appears calm and comfortable, somnolent Respiratory system:  Clear to auscultation. Respiratory effort normal. Cardiovascular system: S1 & S2 heard, RRR.  Gastrointestinal system: Abdomen is soft Central nervous system: Alert and awake Extremities: No edema Skin: No significant lesions noted Psychiatry: Flat affect.    Data Reviewed: I have personally reviewed following labs and imaging studies  CBC: Recent Labs  Lab 03/12/21 0215 03/13/21 0409  WBC 6.5  10.6*  NEUTROABS 3.9  --   HGB 15.3 14.9  HCT 46.0 46.8  MCV 86.6 87.0  PLT 239 696   Basic Metabolic Panel: Recent Labs  Lab 03/12/21 0215 03/13/21 0409  NA 134* 135  K 3.4* 3.6  CL 98 97*  CO2 29 29  GLUCOSE 170* 129*  BUN 19 19  CREATININE 1.04 1.09  CALCIUM 9.2 9.6  MG  --  1.8   GFR: Estimated Creatinine Clearance: 73.7 mL/min (by C-G formula based on SCr of 1.09 mg/dL). Liver Function Tests: Recent Labs  Lab 03/12/21 0215  AST 27  ALT 35  ALKPHOS 46  BILITOT 0.4  PROT 7.1  ALBUMIN 4.1   No results for input(s): LIPASE, AMYLASE in the last 168 hours. No results for input(s): AMMONIA in the last 168 hours. Coagulation Profile: Recent Labs  Lab 03/12/21 0215  INR 0.9   Cardiac Enzymes: No results for input(s): CKTOTAL, CKMB, CKMBINDEX, TROPONINI in the last 168 hours. BNP (last 3 results) No results for input(s): PROBNP in the last 8760 hours. HbA1C: Recent Labs    03/12/21 1725  HGBA1C 7.5*   CBG: Recent Labs  Lab 03/12/21 1138 03/12/21 1628 03/12/21 2159 03/13/21 0713 03/13/21 1119  GLUCAP 164* 118* 116* 178* 135*   Lipid Profile: Recent Labs    03/12/21 1725  CHOL 242*  HDL 29*  LDLCALC UNABLE TO CALCULATE IF TRIGLYCERIDE OVER 400 mg/dL  TRIG 782*  CHOLHDL 8.3   Thyroid Function Tests: No results for input(s): TSH, T4TOTAL, FREET4, T3FREE, THYROIDAB in the last 72 hours. Anemia Panel: No results for input(s): VITAMINB12, FOLATE, FERRITIN, TIBC, IRON, RETICCTPCT in the last 72 hours. Sepsis Labs: No results for input(s): PROCALCITON, LATICACIDVEN in the last 168 hours.  Recent Results (from the past 240 hour(s))  Resp Panel by RT-PCR (Flu A&B, Covid) Nasopharyngeal Swab     Status: None   Collection Time: 03/12/21  2:15 AM   Specimen: Nasopharyngeal Swab; Nasopharyngeal(NP) swabs in vial transport medium  Result Value Ref Range Status   SARS Coronavirus 2 by RT PCR NEGATIVE NEGATIVE Final    Comment: (NOTE) SARS-CoV-2 target  nucleic acids are NOT DETECTED.  The SARS-CoV-2 RNA is generally detectable in upper respiratory specimens during the acute phase of infection. The lowest concentration of SARS-CoV-2 viral copies this assay can detect is 138 copies/mL. A negative result does not preclude SARS-Cov-2 infection and should not be used as the sole basis for treatment or other patient management decisions. A negative result may occur with  improper specimen collection/handling, submission of specimen other than nasopharyngeal swab, presence of viral mutation(s) within the areas targeted by this assay, and inadequate number of viral copies(<138 copies/mL). A negative result must be combined with clinical observations, patient history, and epidemiological information. The expected result is Negative.  Fact Sheet for Patients:  EntrepreneurPulse.com.au  Fact Sheet for Healthcare Providers:  IncredibleEmployment.be  This test is no t yet approved or cleared by the [place] FDA and  has been authorized for detection and/or diagnosis of SARS-CoV-2 by FDA under an Emergency Use Authorization (EUA). This EUA will remain  in effect (meaning this test can be used) for the duration of the COVID-19 declaration under Section 564(b)(1) of the Act, 21 U.S.C.section 360bbb-3(b)(1), unless the authorization is terminated  or revoked sooner.       Influenza A by PCR NEGATIVE NEGATIVE Final   Influenza B by PCR NEGATIVE NEGATIVE Final    Comment: (NOTE) The Xpert Xpress SARS-CoV-2/FLU/RSV plus assay is intended as an aid in the diagnosis of influenza from Nasopharyngeal swab specimens and should not be used as a sole basis for treatment. Nasal washings and aspirates are unacceptable for Xpert Xpress SARS-CoV-2/FLU/RSV testing.  Fact Sheet for Patients: EntrepreneurPulse.com.au  Fact Sheet for Healthcare  Providers: IncredibleEmployment.be  This test is not yet approved or cleared by the [place] FDA and has been authorized for detection and/or diagnosis of SARS-CoV-2 by FDA under an Emergency Use Authorization (EUA). This EUA will remain in effect (meaning this test can be used) for the duration of the COVID-19 declaration under Section 564(b)(1) of the Act, 21 U.S.C. section 360bbb-3(b)(1), unless the authorization is terminated or revoked.  Performed at [facility], [address], [place],  [address]   MRSA PCR Screening     Status: None   Collection Time: 03/12/21  6:14 AM   Specimen: Nasal Mucosa; Nasopharyngeal  Result Value Ref Range Status   MRSA by PCR NEGATIVE NEGATIVE Final    Comment:        The GeneXpert MRSA Assay (FDA approved for NASAL specimens only), is one component of a comprehensive MRSA colonization surveillance program. It is not intended to diagnose MRSA infection nor to guide or monitor treatment for MRSA infections. Performed at [facility], [address] [place],  [address]          Radiology Studies: DG Chest 2 View  Result Date: 03/12/2021 CLINICAL DATA:  Chest pain, shortness of breath EXAM: CHEST - 2 VIEW COMPARISON:  04/06/2019 FINDINGS: Areas of scarring in the upper lobes. Hyperinflation/emphysema. Heart is normal size. No acute confluent opacities or effusions. IMPRESSION: COPD/chronic changes.  No active disease. Electronically Signed   By: Rolm Baptise M.D.   On: 03/12/2021 02:19   CT Head Wo Contrast  Result Date: 03/12/2021 CLINICAL DATA:  Cerebral hemorrhage suspected EXAM: CT HEAD WITHOUT CONTRAST TECHNIQUE: Contiguous axial images were obtained from the base of the skull through the vertex without intravenous contrast. COMPARISON:  03/07/2018 FINDINGS: Brain: Mild diffuse atrophy. No acute intracranial abnormality. Specifically, no hemorrhage, hydrocephalus, mass lesion, acute infarction, or  significant intracranial injury. Vascular: No hyperdense vessel or unexpected calcification. Skull: No acute calvarial abnormality. Sinuses/Orbits: No acute findings Other: None IMPRESSION: No acute intracranial abnormality. Electronically Signed   By: Rolm Baptise M.D.   On: 03/12/2021 02:48   MR ANGIO HEAD WO CONTRAST  Result Date: 03/12/2021 CLINICAL DATA:  Bilateral weakness EXAM: MRI HEAD WITHOUT CONTRAST MRA HEAD WITHOUT CONTRAST TECHNIQUE: Multiplanar, multi-echo pulse sequences of the brain and surrounding structures were acquired without intravenous contrast. Angiographic images of the Circle of Willis were acquired using MRA technique without intravenous contrast. COMPARISON:  MRI brain 07/23/2018 FINDINGS: MRI HEAD Brain: There is restricted diffusion in the left PCA territory involving the parasagittal occipital lobe and medial left temporal lobe. Subtle early changes are likely present in retrospect on the prior CT. There is additional involvement of the left thalamus. Minimal involvement of the contralateral right thalamus is also noted. There is no evidence of intracranial hemorrhage. Prominence of the ventricles and sulci reflects generalized parenchymal  volume loss. Additional patchy T2 hyperintensity in the supratentorial white matter is nonspecific but may reflect minor chronic microvascular ischemic changes. 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 There is no intracranial mass or significant mass effect. There is no hydrocephalus or extra-axial fluid collection. Vascular: Major vessel flow voids at the skull base are preserved. Skull and upper cervical spine: Normal marrow signal is preserved. Sinuses/Orbits: Paranasal sinuses are aerated. Orbits are unremarkable. Other: Sella is unremarkable.  Mastoid air cells are clear. MRA HEAD Intracranial internal carotid arteries are patent with atherosclerotic irregularity. Middle and anterior cerebral arteries  are patent. Intracranial vertebral arteries are patent. Basilar artery is patent. Right posterior cerebral artery is patent. Left P1 PCA is patent. There is occlusion of the left PCA at the P1-P2 junction. No aneurysm. IMPRESSION: Acute large left PCA territory infarction. Left PCA occlusion at the P1-P2 junction. Small acute right thalamic infarct. No hemorrhage or mass effect. These results were called by telephone at the time of interpretation on 03/12/2021 at 10:18 am to provider [facility] , who verbally acknowledged these results. Electronically Signed   By: Macy Mis M.D.   On: 03/12/2021 10:27   MR BRAIN WO CONTRAST  Result Date: 03/12/2021 CLINICAL DATA:  Bilateral weakness EXAM: MRI HEAD WITHOUT CONTRAST MRA HEAD WITHOUT CONTRAST TECHNIQUE: Multiplanar, multi-echo pulse sequences of the brain and surrounding structures were acquired without intravenous contrast. Angiographic images of the Circle of Willis were acquired using MRA technique without intravenous contrast. COMPARISON:  MRI brain 07/23/2018 FINDINGS: MRI HEAD Brain: There is restricted diffusion in the left PCA territory involving the parasagittal occipital lobe and medial left temporal lobe. Subtle early changes are likely present in retrospect on the prior CT. There is additional involvement of the left thalamus. Minimal involvement of the contralateral right thalamus is also noted. There is no evidence of intracranial hemorrhage. Prominence of the ventricles and sulci reflects generalized parenchymal volume loss. Additional patchy T2 hyperintensity in the supratentorial white matter is nonspecific but may reflect minor chronic microvascular ischemic changes. 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 There is no intracranial mass or significant mass effect. There is no hydrocephalus or extra-axial fluid collection. Vascular: Major vessel flow voids at the skull base are preserved. Skull and  upper cervical spine: Normal marrow signal is preserved. Sinuses/Orbits: Paranasal sinuses are aerated. Orbits are unremarkable. Other: Sella is unremarkable.  Mastoid air cells are clear. MRA HEAD Intracranial internal carotid arteries are patent with atherosclerotic irregularity. Middle and anterior cerebral arteries are patent. Intracranial vertebral arteries are patent. Basilar artery is patent. Right posterior cerebral artery is patent. Left P1 PCA is patent. There is occlusion of the left PCA at the P1-P2 junction. No aneurysm. IMPRESSION: Acute large left PCA territory infarction. Left PCA occlusion at the P1-P2 junction. Small acute right thalamic infarct. No hemorrhage or mass effect. These results were called by telephone at the time of interpretation on 03/12/2021 at 10:18 am to provider [facility] , who verbally acknowledged these results. Electronically Signed   By: Macy Mis M.D.   On: 03/12/2021 10:27   US Carotid Bilateral  Result Date: 03/12/2021 CLINICAL DATA:  Chest pain with shortness of breath EXAM: BILATERAL CAROTID DUPLEX ULTRASOUND TECHNIQUE: Pearline Cables scale imaging, color Doppler and duplex ultrasound were performed of bilateral carotid and vertebral arteries in the neck. COMPARISON:  None. FINDINGS: Criteria: Quantification of carotid stenosis is based on velocity parameters  that correlate the residual internal carotid diameter with NASCET-based stenosis levels, using the diameter of the distal internal carotid lumen as the denominator for stenosis measurement. The following velocity measurements were obtained: RIGHT ICA: 122/32 cm/sec CCA: 54/00 cm/sec SYSTOLIC ICA/CCA RATIO:  2.2 ECA: 122 cm/sec LEFT ICA: 105/33 cm/sec CCA: 86/76 cm/sec SYSTOLIC ICA/CCA RATIO:  1.5 ECA: 157 cm/sec RIGHT CAROTID ARTERY: Hypoechoic intimal thickening and mixed plaque formation. Despite this, there is no hemodynamically significant right ICA stenosis, velocity elevation, or turbulent flow. Degree of  narrowing less than 50% by ultrasound criteria. RIGHT VERTEBRAL ARTERY:  Normal antegrade flow LEFT CAROTID ARTERY: Similar intimal thickening and mixed echogenicity bifurcation plaque. No hemodynamically significant left ICA stenosis, velocity elevation, or turbulent flow. Degree of narrowing also less than 50% by ultrasound criteria. LEFT VERTEBRAL ARTERY:  Normal antegrade flow IMPRESSION: Bilateral carotid atherosclerosis. No hemodynamically significant ICA stenosis. Degree of narrowing less than 50% bilaterally by ultrasound criteria. Patent antegrade vertebral flow bilaterally Electronically Signed   By: Jerilynn Mages.  Shick M.D.   On: 03/12/2021 16:13   US Carotid Bilateral (at [facility] and [facility] only)  Result Date: 03/12/2021 CLINICAL DATA:  New right-sided weakness EXAM: BILATERAL CAROTID DUPLEX ULTRASOUND TECHNIQUE: Pearline Cables scale imaging, color Doppler and duplex ultrasound were performed of bilateral carotid and vertebral arteries in the neck. COMPARISON:  None. FINDINGS: Criteria: Quantification of carotid stenosis is based on velocity parameters that correlate the residual internal carotid diameter with NASCET-based stenosis levels, using the diameter of the distal internal carotid lumen as the denominator for stenosis measurement. The following velocity measurements were obtained: RIGHT ICA: 122/36 cm/sec CCA: 19/50 cm/sec SYSTOLIC ICA/CCA RATIO:  84 ECA:  1.5 cm/sec LEFT ICA: 87/38 cm/sec CCA: 93/26 cm/sec SYSTOLIC ICA/CCA RATIO:  1.3 ECA:  83 cm/sec RIGHT CAROTID ARTERY: No significant atherosclerotic plaque or evidence of stenosis in the internal carotid artery. RIGHT VERTEBRAL ARTERY:  Patent with normal antegrade flow. LEFT CAROTID ARTERY: No significant atherosclerotic plaque or evidence of stenosis in the internal carotid artery. LEFT VERTEBRAL ARTERY:  Patent with normal antegrade flow. IMPRESSION: 1. No significant atherosclerotic plaque or evidence of stenosis in the internal carotid arteries. 2. Vertebral  arteries are patent with normal antegrade flow. Electronically Signed   By: Jacqulynn Cadet M.D.   On: 03/12/2021 11:08        Scheduled Meds:  albuterol  2.5 mg Nebulization BID   aspirin EC  325 mg Oral Daily   Chlorhexidine Gluconate Cloth  6 each Topical Daily   cholecalciferol  5,000 Units Oral Daily   enoxaparin (LOVENOX) injection  50 mg Subcutaneous Q24H   fenofibrate  160 mg Oral Daily   insulin aspart  0-15 Units Subcutaneous TID WC   loratadine  10 mg Oral Daily   melatonin  6 mg Oral QHS   pravastatin  20 mg Oral Daily     LOS: 1 day    Time spent: 35 minutes    Alezander Dimaano Darleen Crocker, DO [facility]  If 7PM-7AM, please contact night-coverage [email or web address] 03/13/2021, 11:52 AM

## 2021-03-13 NOTE — Plan of Care (Signed)
  Problem: SLP Language Goals Goal: Patient will communicate needs/wants with Flowsheets (Taken 03/13/2021 1455) Patient will communicate ____  needs/wants with:  min assist  basic Goal: Patient will follow commands during a functional ADL with Flowsheets (Taken 03/13/2021 1455) Patient will follow ____ commands during a functional ADL with ____:  2 step  modified independence  Thank you,  Genene Churn, [place]

## 2021-03-13 NOTE — Progress Notes (Signed)
Patient transferred back to stepdown unit, Room 6. Report given to Einar Pheasant, RN receiving nurse.

## 2021-03-13 NOTE — Evaluation (Signed)
Occupational Therapy Evaluation Patient Details Name: Randall Sullivan MRN: [id number] DOB: 1950/10/03 Today's Date: 03/13/2021    History of Present Illness Randall Sullivan is a 70 y.o. male with medical history significant of paroxysmal atrial fibrillation, osteoarthritis, seasonal allergies, lung cancer, emphysema on home oxygen at 2 LPM, active smoker, hyperlipidemia, hypertension who was LKW  around 2000 on 03/11/2021 when he went to sleep and woke up around 0100 on 03/12/2021 with complaints of pressure-like nonradiating chest pain and shortness of breath.  No palpitations or orthopnea.  No recent lower extremity edema.  His wife stated he seemed a little confused initially, but this subsequently improved.  No aphasia or dysarthria.  No trouble comprehending language.  No headaches, nausea, blurred vision or lightheadedness.  However, the patient was clearly weaker on the right side, which was noticed by EMS but it improved in route to the hospital.  No fever, chills, sore throat, rhinorrhea, hemoptysis, but positive dyspnea and wheezing.  He has occasionally constipated, but otherwise denies abdominal pain, diarrhea, melena or hematochezia.  No dysuria, frequency or hematuria.  No polyuria, polydipsia, polyphagia or blurred vision   Clinical Impression   Upon arrival to room pt was prone in bed. Pt agreeable to OT/Pt treatment and evaluation. Pt able to complete supine to sit bed mobility with Mod I level of assist. Pt required Min A for sit to stand and ambulating transfer from EOB to walking in hall to chair. Verbal and tactile cuing needed for safety with RW when transferring to chair. Pt demonstrates possible R side visual field deficit with mixed results in vision assessments. R shoulder weakness 3+/5 for shoulder flexion with increased strength distally 4+/5 grip and wrist strength. Pt able to doff and don L sock seated in chair. Pt will benefit from continued OT in the hospital and recommended venue below  to increase strength, balance, visual coordination/perception, and endurance for safe ADL's.     Follow Up Recommendations  CIR    Equipment Recommendations  None recommended by OT    Recommendations for Other Services Other (comment) (nuro vision specialist)     Precautions / Restrictions Precautions Precautions: Fall Restrictions Weight Bearing Restrictions: No      Mobility Bed Mobility Overal bed mobility: Modified Independent             General bed mobility comments: increased time and labored movements    Transfers Overall transfer level: Needs assistance Equipment used: Rolling walker (2 wheeled) Transfers: Sit to/from [facility] Sit to Stand: Min assist;Mod assist Stand pivot transfers: Min assist;Mod assist       General transfer comment: verbal and tactile cues for RW use    Balance Overall balance assessment: Needs assistance Sitting-balance support: Bilateral upper extremity supported;Feet supported Sitting balance-Leahy Scale: Fair Sitting balance - Comments: fair/poor at EOB   Standing balance support: Bilateral upper extremity supported;During functional activity Standing balance-Leahy Scale: Poor Standing balance comment: fair/poor with RW                           ADL either performed or assessed with clinical judgement   ADL Overall ADL's : Needs assistance/impaired Eating/Feeding: Modified independent;Independent;Sitting   Grooming: Set up;Supervision/safety;Sitting   Upper Body Bathing: Supervision/ safety;Sitting   Lower Body Bathing: Minimal assistance;Sit to/from stand;Sitting/lateral leans;With adaptive equipment   Upper Body Dressing : Supervision/safety;Sitting   Lower Body Dressing: Minimal assistance;Sit to/from stand;Min guard;With adaptive equipment   Toilet Transfer: Minimal assistance;RW;Stand-pivot;Ambulation Toilet Transfer  Details (indicate cue type and reason): simulated via ambulatory  transfer from EOB to chair Toileting- Clothing Manipulation and Hygiene: Minimal assistance;Min guard;Sit to/from stand   Tub/ Shower Transfer: Minimal assistance;Tub bench;Rolling walker   Functional mobility during ADLs: Minimal assistance;Rolling walker General ADL Comments: Clinical judgement utilized to estimate assist level needed for ADL's. Pt did demonstrate donning and doffing socks with Mod I assist.     Vision Baseline Vision/History: No visual deficits Patient Visual Report: Peripheral vision impairment Vision Assessment?: Yes Eye Alignment: Within Functional Limits Ocular Range of Motion: Within Functional Limits Alignment/Gaze Preference: Within Defined Limits Tracking/Visual Pursuits: Able to track stimulus in all quads without difficulty Saccades: Undershoots;Other (comment) (Difficulty fully gazing at obejct in R upper or lower quadrant.) Convergence: Impaired (comment) (Moderate difficulty with convergence. Not fully shifting eyes medial.) Visual Fields: Right visual field deficit (Inconsistent with testing. Reported deficit/cut in R upper and lower quadrant on first test but then reported he could see the objects. Able to make "x" marks on paper correctly marking 75+% of squares with an x. Typically marking outside box inferiorly.)                Pertinent Vitals/Pain Pain Assessment: No/denies pain     Hand Dominance Right   Extremity/Trunk Assessment Upper Extremity Assessment Upper Extremity Assessment: RUE deficits/detail;LUE deficits/detail RUE Deficits / Details: WFL A/ROM. 3+/5 shoulder flexion, 4/5 shoulder abduction, 4+/5 elbow flexion, 4/5 elbow extension, 4+/5 wrist flexion and extension, 4+5 grip strength. RUE Coordination: decreased fine motor;decreased gross motor LUE Deficits / Details: WFL 5/5 gross MMT LUE Coordination: WNL   Lower Extremity Assessment Lower Extremity Assessment: Defer to PT evaluation   Cervical / Trunk  Assessment Cervical / Trunk Assessment: Kyphotic   Communication Communication Communication: No difficulties   Cognition Arousal/Alertness: Awake/alert Behavior During Therapy: WFL for tasks assessed/performed;Impulsive Overall Cognitive Status: Within Functional Limits for tasks assessed                                 General Comments: Patient is slighly impulsive during ambulation.                    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        OT Problem List: Decreased strength;Decreased range of motion;Decreased activity tolerance;Impaired balance (sitting and/or standing);Impaired vision/perception;Decreased coordination;Decreased safety awareness;Decreased knowledge of use of DME or AE      OT Treatment/Interventions: Self-care/ADL training;Therapeutic exercise;Neuromuscular education;Energy conservation;DME and/or AE instruction;Manual therapy;Therapeutic activities;Visual/perceptual remediation/compensation;Patient/family education;Balance training    OT Goals(Current goals can be found in the care plan section) Acute Rehab OT Goals Patient Stated Goal: return home OT Goal Formulation: With patient Time For Goal Achievement: 03/27/21 Potential to Achieve Goals: Good  OT Frequency: Min 2X/week   Barriers to D/C:            Co-evaluation PT/OT/SLP Co-Evaluation/Treatment:  Yes Reason for Co-Treatment: To  address functional/ADL transfers   OT goals addressed during session: Strengthening/ROM;ADL's and self-care                       End of Session Equipment Utilized During Treatment: Rolling walker;Gait belt;Oxygen (2.5 LPM O2)  Activity Tolerance: Patient tolerated treatment well Patient left: in chair;with call bell/phone within reach;with chair alarm set;with nursing/sitter in room  OT Visit Diagnosis: Unsteadiness on feet (R26.81);Other abnormalities of gait and mobility (R26.89);Muscle weakness (generalized) (M62.81);Other symptoms and signs involving the nervous system (R29.898);Hemiplegia and hemiparesis Hemiplegia - Right/Left: Right Hemiplegia - dominant/non-dominant: Dominant Hemiplegia - caused by: Cerebral infarction                Time: 0315-9458 OT Time Calculation (min): 27 min Charges:  OT General Charges $OT Visit: 1 Visit OT Evaluation $OT Eval Low Complexity: 1 Low  Randall Sullivan OT, MOT   Larey Seat 03/13/2021, 9:18 AM

## 2021-03-13 NOTE — Progress Notes (Addendum)
Physical Therapy Treatment Patient Details Name: Randall Sullivan MRN: [id number] DOB: March 20, 1951 Today's Date: 03/13/2021    History of Present Illness Randall Sullivan is a 70 y.o. male with medical history significant of paroxysmal atrial fibrillation, osteoarthritis, seasonal allergies, lung cancer, emphysema on home oxygen at 2 LPM, active smoker, hyperlipidemia, hypertension who was LKW  around 2000 on 03/11/2021 when he went to sleep and woke up around 0100 on 03/12/2021 with complaints of pressure-like nonradiating chest pain and shortness of breath.  No palpitations or orthopnea.  No recent lower extremity edema.  His wife stated he seemed a little confused initially, but this subsequently improved.  No aphasia or dysarthria.  No trouble comprehending language.  No headaches, nausea, blurred vision or lightheadedness.  However, the patient was clearly weaker on the right side, which was noticed by EMS but it improved in route to the hospital.  No fever, chills, sore throat, rhinorrhea, hemoptysis, but positive dyspnea and wheezing.  He has occasionally constipated, but otherwise denies abdominal pain, diarrhea, melena or hematochezia.  No dysuria, frequency or hematuria.  No polyuria, polydipsia, polyphagia or blurred vision    PT Comments    Patient demonstrated good bed mobility with verbal cues and good seated balance at EOB. Patient was able to transfer from sit to stand with min assist and verbal/tactile cues for proper use of RW. Patient was able to ambulate 70 feet with RW with a noted improvement in gait pattern. Patient required verbal and tactile cues to prevent loss of balance during turns with RW. Patient demonstrated good return to chair after therapy. Patient will benefit from continued physical therapy in hospital and recommended venue below to increase strength, balance, endurance for safe ADLs and gait.    Follow Up Recommendations  CIR     Equipment Recommendations  Rolling walker with  5" wheels    Recommendations for Other Services       Precautions / Restrictions Precautions Precautions: Fall Restrictions Weight Bearing Restrictions: No    Mobility  Bed Mobility Overal bed mobility: Modified Independent             General bed mobility comments: increased time and labored movements Patient Response: Cooperative;Impulsive  Transfers Overall transfer level: Needs assistance Equipment used: Rolling walker (2 wheeled) Transfers: Sit to/from [facility] Sit to Stand: Min assist;Min guard Stand pivot transfers: Min assist;Min guard       General transfer comment: verbal and tactile cues for use of RW  Ambulation/Gait Ambulation/Gait assistance: Mod assist;Min assist Gait Distance (Feet): 70 Feet Assistive device: Rolling walker (2 wheeled) Gait Pattern/deviations: Decreased step length - right;Decreased stance time - right;Decreased stride length;Decreased weight shift to right;Step-through pattern Gait velocity: decreased   General Gait Details: Patient required verbal cues to stand up straight and correct right lateral lean. Patient was able to step with a recriprical gait pattern   Stairs             Wheelchair Mobility    Modified Rankin (Stroke Patients Only)       Balance Overall balance assessment: Needs assistance Sitting-balance support: Bilateral upper extremity supported;Feet supported Sitting balance-Leahy Scale: Good Sitting balance - Comments: good/fair at EOB Postural control: Right lateral lean Standing balance support: Bilateral upper extremity supported;During functional activity Standing balance-Leahy Scale: Fair Standing balance comment: Fair/poor with RW                            Cognition Arousal/Alertness:  Awake/alert Behavior During Therapy: [facility] for tasks assessed/performed;Impulsive Overall Cognitive Status: Within Functional Limits for tasks assessed                                  General Comments: Patient is slighly impulsive during ambulation.      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   PT Goals (current goals can now be found in the care plan section) Acute Rehab PT Goals Patient Stated Goal: return home PT Goal Formulation: With patient/family Time For Goal Achievement: 03/26/21 Potential to Achieve Goals: Good Progress towards PT goals: Progressing toward goals    Frequency    Min 5X/week      PT Plan Current plan remains appropriate    Co-evaluation PT/OT/SLP Co-Evaluation/Treatment: Yes Reason for Co-Treatment: To address functional/ADL transfers PT goals addressed during session: Mobility/safety with mobility OT goals addressed during session: Strengthening/ROM;ADL's and self-care      AM-PAC PT "6 Clicks" Mobility   Outcome Measure  Help needed turning from your back to your side while in a flat bed without using bedrails?: None Help needed moving from lying on your back to sitting on the side of a flat bed without using bedrails?: None Help needed moving to and from a bed to a chair (including a wheelchair)?: A Little Help needed standing up from a chair using your arms (e.g., wheelchair or bedside chair)?: A Little Help needed to walk in hospital room?: A Little Help needed climbing 3-5 steps with a railing? : A Lot 6 Click Score: 19    End of Session Equipment Utilized During Treatment: Gait  belt Activity Tolerance: Patient tolerated treatment well;Patient limited by fatigue Patient left: in chair;with call bell/phone within reach;with chair alarm set;with family/visitor present Nurse Communication: Mobility status PT Visit Diagnosis: Unsteadiness on feet (R26.81);Other abnormalities of gait and mobility (R26.89);Muscle weakness (generalized) (M62.81)     Time: 1884-1660 PT Time Calculation (min) (ACUTE ONLY): 30 min  Charges:  $Gait Training: 8-22 mins $Therapeutic Exercise: 8-22 mins                    10:08 AM, 03/13/21 Sinclair Ship SPT  10:09 AM, 03/13/21 Lonell Grandchild, MPT Physical Therapist with [facility] [phone] office [phone] mobile phone

## 2021-03-13 NOTE — Progress Notes (Signed)
   03/13/21 1844  Assess: MEWS Score  Temp 98.2 F (36.8 C)  BP (!) 157/115  Pulse Rate (!) 124  Resp 18  SpO2 98 %  O2 Device Nasal Cannula  O2 Flow Rate (L/min) 2 L/min  Assess: MEWS Score  MEWS Temp 0  MEWS Systolic 0  MEWS Pulse 2  MEWS RR 0  MEWS LOC 0  MEWS Score 2  MEWS Score Color Yellow  Assess: if the MEWS score is Yellow or Red  Were vital signs taken at a resting state? Yes  Focused Assessment Change from prior assessment (see assessment flowsheet)  Early Detection of Sepsis Score *See Row Information* Low  MEWS guidelines implemented *See Row Information* Yes  Treat  MEWS Interventions Escalated (See documentation below);Other (Comment) (paged dr Manuella Ghazi)  Pain Scale 0-10  Pain Score 0  Take Vital Signs  Increase Vital Sign Frequency  Yellow: Q 2hr X 2 then Q 4hr X 2, if remains yellow, continue Q 4hrs  Escalate  MEWS: Escalate Yellow: discuss with charge nurse/RN and consider discussing with provider and RRT  Notify: Charge Nurse/RN  Name of Charge Nurse/RN Notified anne hunt, RN  Date Charge Nurse/RN Notified 03/13/21  Time Charge Nurse/RN Notified 1849  Notify: Provider  Provider Name/Title dr Manuella Ghazi  Date Provider Notified 03/13/21  Time Provider Notified [phone]  Notification Type Page  Notification Reason Change in status (HR sustaining in the 130s)  Provider response See new orders (transfer back to stepdown unit)  Date of Provider Response 03/13/21  Time of Provider Response 1852  Notify: Rapid Response  Name of Rapid Response RN Notified lawerence hylton, RN  Date Rapid Response Notified 03/13/21  Time Rapid Response Notified 1852  Document  Patient Outcome Transferred/level of care increased (transfer back to stepdown unit)  Progress note created (see row info) Yes  Report received from ICU RN, patient stable for transfer, NSR today with HR in the 80s-90s per chart review. Patient noted to have elevated BP and HR sustaining in 130s on admission.  Patient states "I feel funny, don't feel well I'm tired like I run a race". Reports palpitations, shortness of breath, O2 sats 98% on r/a. Weakness and irregular rhythm on auscultation. Yellow MEWS. Notified Dr. Manuella Ghazi. EKG obtained. Orders to transfer back to stepdown unit. Patient aware of transfer back to stepdown unit.

## 2021-03-13 NOTE — Evaluation (Signed)
Speech Language Pathology Evaluation Patient Details Name: Randall Sullivan MRN: [id number] DOB: November 01, 1950 Today's Date: 03/13/2021 Time: 1240-1311 SLP Time Calculation (min) (ACUTE ONLY): 31.08 min  Problem List:  Patient Active Problem List   Diagnosis Date Noted   Right sided weakness 03/12/2021   COPD with acute exacerbation ([place]) 03/12/2021   Hypokalemia 03/12/2021   Hyperglycemia 03/12/2021   CVA (cerebral vascular accident) ([place]) 03/12/2021   Aortic atherosclerosis ([place]) 08/30/2020   Elevated coronary artery calcium score 08/30/2020   Hyperlipidemia 11/17/2019   TSH (thyroid-stimulating hormone deficiency) 11/17/2019   S/P Wedge Resection RUL, Blebectomy RUL 12/09/2018   Primary lung adenocarcinoma ([place]) 10/15/2016   PAF (paroxysmal atrial fibrillation) ([place]) 09/16/2016   Encounter for therapeutic drug monitoring 09/16/2016   S/P lobectomy of lung 08/05/2016   Cancer of left lung ([place]) 08/01/2016   Lung nodule 06/11/2016   Former smoker 06/11/2016   Emphysema of lung ([place]) 06/11/2016   Essential (primary) hypertension 10/06/2014   Past Medical History:  Past Medical History:  Diagnosis Date   A-fib ([place]) 2017   after last lung surgery patient had a history if Afib documented in hospital    Allergy    Arthritis    back & legs ( knees)   Cancer ([place])    lung   Complication of anesthesia    agitated upon waking from anesth.    Emphysema    History of lung cancer    Hyperlipidemia    Hypertension    Oxygen dependent    2L continuous   Tobacco abuse    Past Surgical History:  Past Surgical History:  Procedure Laterality Date   HERNIA REPAIR Bilateral 3299   umbilical   VIDEO ASSISTED THORACOSCOPY Right 12/21/2018   Procedure: VIDEO ASSISTED THORACOSCOPY AND RESECTION OF BLEBS RIGHT LOWER LOBE;  Surgeon: [name], MD;  Location: [facility];  Service: Thoracic;  Laterality: Right;   VIDEO ASSISTED THORACOSCOPY (VATS)/ LOBECTOMY Left 08/01/2016   Procedure:  VIDEO ASSISTED THORACOSCOPY (VATS)/ LEFT UPPER LOBECTOMY with lymph node sampling and onQ placement;  Surgeon: [name], MD;  Location: [facility];  Service: Thoracic;  Laterality: Left;   VIDEO ASSISTED THORACOSCOPY (VATS)/WEDGE RESECTION Right 12/09/2018   Procedure: VIDEO ASSISTED THORACOSCOPY (VATS)/WEDGE RESECTION;  Surgeon: [name], MD;  Location: [facility];  Service: Thoracic;  Laterality: Right;   VIDEO BRONCHOSCOPY Bilateral 12/21/2018   Procedure: VIDEO BRONCHOSCOPY;  Surgeon: [name], MD;  Location: [facility];  Service: Thoracic;  Laterality: Bilateral;   VIDEO BRONCHOSCOPY WITH ENDOBRONCHIAL NAVIGATION N/A 07/03/2016   Procedure: VIDEO BRONCHOSCOPY WITH ENDOBRONCHIAL NAVIGATION;  Surgeon: [name], MD;  Location: [facility];  Service: Thoracic;  Laterality: N/A;   VIDEO BRONCHOSCOPY WITH INSERTION OF INTERBRONCHIAL VALVE (IBV) N/A 12/11/2018   Procedure: VIDEO BRONCHOSCOPY WITH INSERTION OF INTERBRONCHIAL VALVE (IBV);  Surgeon: [name], MD;  Location: [facility] OR;  Service: Thoracic;  Laterality: N/A;   VIDEO BRONCHOSCOPY WITH INSERTION OF INTERBRONCHIAL VALVE (IBV) N/A 02/10/2019   Procedure: VIDEO BRONCHOSCOPY WITH REMOVAL OF INTERBRONCHIAL VALVE (IBV);  Surgeon: [name], MD;  Location: [facility] OR;  Service: Thoracic;  Laterality: N/A;   HPI:  Randall Sullivan is a 70 y.o. male with medical history significant of paroxysmal atrial fibrillation, osteoarthritis, seasonal allergies, lung cancer, emphysema on home oxygen at 2 LPM, active smoker, hyperlipidemia, hypertension who was LKW  around 2000 on 03/11/2021 when he went to sleep and woke up around 0100 on 03/12/2021 with complaints of pressure-like nonradiating chest  pain and shortness of breath.  He was noted to have right-sided weakness and was admitted for further assessment of CVA.  CT head with no acute findings initially, but brain MRI demonstrating large territory L PCA infarct with occlusion  of PCA.  He has been started on full dose aspirin and PT is recommending CIR placement. SLE ordered.   Assessment / Plan / Recommendation Clinical Impression  Pt presents with moderate expressive aphasia and suspected mild receptive aphasia characterized by dysnomia specifically with confrontation naming tasks with semantic paraphasias (knife for spoon, map for calendar) and perseverative responses at times. Pt with delayed responses and expressed frustration over not being able to retrieve the words. Pt speaks in short phrases with pauses for word retrieval. He benefited from binary choice cues (Do you live in [place] or [place]). He was able to name 6 animals in one minute and list activities at home with cues (watch [employer], talk to his wife, and tend to his vegetable garden). He was able to follow 2-step directions with increased vocal volume due to [facility], but had difficulty with 3-step directives. Education regarding aphasia and offering two choices provided to spouse and daughter, but would benefit from additional education. Recommend skilled SLP services in the acute setting and in the next venue (CIR/SNF) to address cognitive communication deficits. SLP will follow. Pt and spouse are in agreement with plan of care. 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    SLP Assessment  SLP Recommendation/Assessment: Patient needs continued Speech Lanaguage Pathology Services SLP Visit Diagnosis: Aphasia (R47.01);Cognitive communication deficit (R41.841)    Follow Up Recommendations  Inpatient Rehab;Skilled Nursing facility    Frequency and Duration min 2x/week  1 week      SLP Evaluation Cognition  Overall Cognitive Status: Impaired/Different from baseline Arousal/Alertness: Awake/alert Orientation Level: Oriented to person;Disoriented to place;Oriented to situation (able to answer with binary choice- aphasia) Memory:  Appears intact Awareness: Impaired Awareness Impairment: Emergent impairment (does not appear to recognize visual impairments) Problem Solving: Impaired Problem Solving Impairment: Verbal complex Executive Function: Organizing;Self Monitoring Organizing: Impaired Organizing Impairment: Verbal complex Self Monitoring: Impaired Self Monitoring Impairment: Verbal complex Behaviors: Perseveration (occasional perseverative responses) Safety/Judgment: Impaired Comments: to be further assessed       Comprehension  Auditory Comprehension Overall Auditory Comprehension: Impaired Yes/No Questions: Within Functional Limits Commands: Impaired Two Step Basic Commands: 75-100% accurate Multistep Basic Commands: 50-74% accurate Conversation: Simple Interfering Components: Hearing EffectiveTechniques: Increased volume;Stressing words;Visual/Gestural cues Visual Recognition/Discrimination Discrimination: Exceptions to [facility] Pictures: Able in field of 2 Reading Comprehension Reading Status: Impaired Word level: Impaired    Expression Expression Primary Mode of Expression: Verbal Verbal Expression Overall Verbal Expression: Impaired Initiation: No impairment Automatic Speech: Name;Day of week;Month of year (needs initial cue) Level of Generative/Spontaneous Verbalization: Phrase Repetition: No impairment Naming: Impairment Responsive: 51-75% accurate Confrontation: Impaired Pictures: Able in field of 2 Convergent: Not tested Divergent: 50-74% accurate (6 animals in one minute) Other Naming Comments: 2/8 confrontation naming, better with cues Verbal Errors: Semantic paraphasias;Not aware of errors;Aware of errors;Perseveration Pragmatics: No impairment Effective Techniques: Semantic cues;Sentence completion;Phonemic cues Non-Verbal Means of Communication: Not applicable Written Expression Dominant Hand: Right Written Expression: Exceptions to [facility] Self Formulation Ability: Word   Oral  / Motor  Oral Motor/Sensory Function Overall Oral Motor/Sensory Function: Within functional limits Motor Speech Overall Motor Speech: Appears within functional limits for tasks assessed Respiration: Within functional limits Phonation: Normal Resonance: Within functional limits Articulation:  Within functional limitis Intelligibility: Intelligible Motor Planning: Witnin functional limits Motor Speech Errors: Not applicable Interfering Components: Hearing loss   Thank you,  Genene Churn, [place]                     [place] 03/13/2021, 1:19 PM

## 2021-03-13 NOTE — Plan of Care (Signed)
  Problem: Acute Rehab OT Goals (only OT should resolve) Goal: Pt. Will Perform Grooming Flowsheets (Taken 03/13/2021 0922) Pt Will Perform Grooming:  standing  with modified independence  with adaptive equipment Goal: Pt. Will Perform Upper Body Dressing Flowsheets (Taken 03/13/2021 0922) Pt Will Perform Upper Body Dressing:  Independently  sitting Goal: Pt. Will Perform Lower Body Dressing Flowsheets (Taken 03/13/2021 0922) Pt Will Perform Lower Body Dressing:  with modified independence  sitting/lateral leans  sit to/from stand  with adaptive equipment Goal: Pt. Will Transfer To Toilet Flowsheets (Taken 03/13/2021 [phone]) Pt Will Transfer to Toilet:  with modified independence  stand pivot transfer  ambulating Goal: Pt/Caregiver Will Perform Home Exercise Program Flowsheets (Taken 03/13/2021 1771) Pt/caregiver will Perform Home Exercise Program:  Increased strength  Right Upper extremity  With Supervision Goal: OT Additional ADL Goal #1 Flowsheets (Taken 03/13/2021 1657) Additional ADL Goal #1: Pt will demonstrate improved saccades and visual fields by consistently visually locating items in R field without reports or observation of difficulty.  Thanh Pomerleau OT, MOT

## 2021-03-13 NOTE — Progress Notes (Signed)
Inpatient Rehab Admissions Coordinator:   CIR consult received, Pt. Does appear to be an appropriate candidate but does not appear medically ready for CIR at this time (Pt.  With Elevated HR, BP, and WBC ). I will follow for potential admission once medically ready.   Clemens Catholic, [place], [place] Admissions Coordinator  [phone] ([place]) [phone] (office)

## 2021-03-14 ENCOUNTER — Encounter (HOSPITAL_COMMUNITY): Payer: Self-pay | Admitting: Physical Medicine & Rehabilitation

## 2021-03-14 ENCOUNTER — Inpatient Hospital Stay (HOSPITAL_COMMUNITY)
Admission: RE | Admit: 2021-03-14 | Discharge: 2021-03-23 | DRG: 057 | Disposition: A | Discharge status: Home Health | Payer: Medicare Other | Source: Intra-hospital | Attending: Physical Medicine & Rehabilitation | Admitting: Physical Medicine & Rehabilitation

## 2021-03-14 ENCOUNTER — Other Ambulatory Visit: Payer: Self-pay

## 2021-03-14 DIAGNOSIS — I69351 Hemiplegia and hemiparesis following cerebral infarction affecting right dominant side: Principal | ICD-10-CM

## 2021-03-14 DIAGNOSIS — K5900 Constipation, unspecified: Secondary | ICD-10-CM | POA: Diagnosis present

## 2021-03-14 DIAGNOSIS — Z87891 Personal history of nicotine dependence: Secondary | ICD-10-CM

## 2021-03-14 DIAGNOSIS — Z6831 Body mass index (BMI) 31.0-31.9, adult: Secondary | ICD-10-CM | POA: Diagnosis not present

## 2021-03-14 DIAGNOSIS — J439 Emphysema, unspecified: Secondary | ICD-10-CM | POA: Diagnosis present

## 2021-03-14 DIAGNOSIS — I10 Essential (primary) hypertension: Secondary | ICD-10-CM | POA: Diagnosis present

## 2021-03-14 DIAGNOSIS — Z902 Acquired absence of lung [part of]: Secondary | ICD-10-CM

## 2021-03-14 DIAGNOSIS — I63539 Cerebral infarction due to unspecified occlusion or stenosis of unspecified posterior cerebral artery: Secondary | ICD-10-CM | POA: Diagnosis not present

## 2021-03-14 DIAGNOSIS — Z9981 Dependence on supplemental oxygen: Secondary | ICD-10-CM | POA: Diagnosis not present

## 2021-03-14 DIAGNOSIS — H53461 Homonymous bilateral field defects, right side: Secondary | ICD-10-CM | POA: Diagnosis present

## 2021-03-14 DIAGNOSIS — E669 Obesity, unspecified: Secondary | ICD-10-CM | POA: Diagnosis not present

## 2021-03-14 DIAGNOSIS — E1169 Type 2 diabetes mellitus with other specified complication: Secondary | ICD-10-CM | POA: Diagnosis not present

## 2021-03-14 DIAGNOSIS — N182 Chronic kidney disease, stage 2 (mild): Secondary | ICD-10-CM | POA: Diagnosis present

## 2021-03-14 DIAGNOSIS — N179 Acute kidney failure, unspecified: Secondary | ICD-10-CM | POA: Diagnosis present

## 2021-03-14 DIAGNOSIS — I480 Paroxysmal atrial fibrillation: Secondary | ICD-10-CM | POA: Diagnosis present

## 2021-03-14 DIAGNOSIS — R3915 Urgency of urination: Secondary | ICD-10-CM | POA: Diagnosis present

## 2021-03-14 DIAGNOSIS — Z85118 Personal history of other malignant neoplasm of bronchus and lung: Secondary | ICD-10-CM | POA: Diagnosis not present

## 2021-03-14 DIAGNOSIS — Z809 Family history of malignant neoplasm, unspecified: Secondary | ICD-10-CM

## 2021-03-14 DIAGNOSIS — I639 Cerebral infarction, unspecified: Secondary | ICD-10-CM | POA: Diagnosis present

## 2021-03-14 DIAGNOSIS — Z79899 Other long term (current) drug therapy: Secondary | ICD-10-CM | POA: Diagnosis not present

## 2021-03-14 DIAGNOSIS — E1122 Type 2 diabetes mellitus with diabetic chronic kidney disease: Secondary | ICD-10-CM | POA: Diagnosis present

## 2021-03-14 DIAGNOSIS — Z7982 Long term (current) use of aspirin: Secondary | ICD-10-CM

## 2021-03-14 DIAGNOSIS — I129 Hypertensive chronic kidney disease with stage 1 through stage 4 chronic kidney disease, or unspecified chronic kidney disease: Secondary | ICD-10-CM | POA: Diagnosis present

## 2021-03-14 DIAGNOSIS — E785 Hyperlipidemia, unspecified: Secondary | ICD-10-CM | POA: Diagnosis present

## 2021-03-14 DIAGNOSIS — R531 Weakness: Secondary | ICD-10-CM | POA: Diagnosis not present

## 2021-03-14 DIAGNOSIS — I69320 Aphasia following cerebral infarction: Secondary | ICD-10-CM

## 2021-03-14 DIAGNOSIS — Z825 Family history of asthma and other chronic lower respiratory diseases: Secondary | ICD-10-CM | POA: Diagnosis not present

## 2021-03-14 DIAGNOSIS — I6381 Other cerebral infarction due to occlusion or stenosis of small artery: Secondary | ICD-10-CM | POA: Diagnosis present

## 2021-03-14 LAB — CBC
HCT: 46.9 % (ref 39.0–52.0)
HCT: 47.8 % (ref 39.0–52.0)
Hemoglobin: 14.9 g/dL (ref 13.0–17.0)
Hemoglobin: 15.8 g/dL (ref 13.0–17.0)
MCH: 27.9 pg (ref 26.0–34.0)
MCH: 27.9 pg (ref 26.0–34.0)
MCHC: 31.8 g/dL (ref 30.0–36.0)
MCHC: 33.1 g/dL (ref 30.0–36.0)
MCV: 87.8 fL (ref 80.0–100.0)
MCV: 84.3 fL (ref 80.0–100.0)
Platelets: 224 10*3/uL (ref 150–400)
Platelets: 256 10*3/uL (ref 150–400)
RBC: 5.34 MIL/uL (ref 4.22–5.81)
RBC: 5.67 MIL/uL (ref 4.22–5.81)
RDW: 13.9 % (ref 11.5–15.5)
RDW: 14.1 % (ref 11.5–15.5)
WBC: 9.7 10*3/uL (ref 4.0–10.5)
WBC: 9.7 10*3/uL (ref 4.0–10.5)
nRBC: 0 % (ref 0.0–0.2)
nRBC: 0 % (ref 0.0–0.2)

## 2021-03-14 LAB — PROCALCITONIN: Procalcitonin: 0.1 ng/mL

## 2021-03-14 LAB — BASIC METABOLIC PANEL
Anion gap: 10 (ref 5–15)
BUN: 24 mg/dL — ABNORMAL HIGH (ref 8–23)
CO2: 29 mmol/L (ref 22–32)
Calcium: 9.2 mg/dL (ref 8.9–10.3)
Chloride: 95 mmol/L — ABNORMAL LOW (ref 98–111)
Creatinine, Ser: 1.25 mg/dL — ABNORMAL HIGH (ref 0.61–1.24)
GFR, Estimated: >60 mL/min (ref >60)
Glucose, Bld: 231 mg/dL — ABNORMAL HIGH (ref 70–99)
Potassium: 3.6 mmol/L (ref 3.5–5.1)
Sodium: 134 mmol/L — ABNORMAL LOW (ref 135–145)

## 2021-03-14 LAB — CREATININE, SERUM
Creatinine, Ser: 1.29 mg/dL — ABNORMAL HIGH (ref 0.61–1.24)
GFR, Estimated: 59 mL/min — ABNORMAL LOW (ref >60)

## 2021-03-14 LAB — GLUCOSE, CAPILLARY
Glucose-Capillary: 144 mg/dL — ABNORMAL HIGH (ref 70–99)
Glucose-Capillary: 183 mg/dL — ABNORMAL HIGH (ref 70–99)
Glucose-Capillary: 192 mg/dL — ABNORMAL HIGH (ref 70–99)
Glucose-Capillary: 90 mg/dL (ref 70–99)

## 2021-03-14 LAB — MAGNESIUM: Magnesium: 1.7 mg/dL (ref 1.7–2.4)

## 2021-03-14 MED ORDER — ACETAMINOPHEN 325 MG PO TABS
650.0000 mg | ORAL_TABLET | Freq: Four times a day (QID) | ORAL | Status: DC | PRN
  Administered 2021-03-14 – 2021-03-22 (×5): 650 mg via ORAL
  Filled 2021-03-14 (×5): qty 2

## 2021-03-14 MED ORDER — VITAMIN D 25 MCG (1000 UNIT) PO TABS
5000.0000 [IU] | ORAL_TABLET | Freq: Every day | ORAL | Status: DC
  Administered 2021-03-15 – 2021-03-23 (×9): 5000 [IU] via ORAL
  Filled 2021-03-14 (×9): qty 5

## 2021-03-14 MED ORDER — BUTALBITAL-APAP-CAFFEINE 50-325-40 MG PO TABS: 1.0000 | ORAL_TABLET | Freq: Four times a day (QID) | ORAL | Status: DC | PRN

## 2021-03-14 MED ORDER — EXERCISE FOR HEART AND HEALTH BOOK
Freq: Once | Status: DC
  Filled 2021-03-14: qty 1

## 2021-03-14 MED ORDER — PRAVASTATIN SODIUM 10 MG PO TABS
20.0000 mg | ORAL_TABLET | Freq: Every day | ORAL | Status: DC
  Administered 2021-03-15 – 2021-03-16 (×2): 20 mg via ORAL
  Filled 2021-03-14 (×2): qty 2

## 2021-03-14 MED ORDER — INSULIN ASPART 100 UNIT/ML IJ SOLN
0.0000 [IU] | Freq: Three times a day (TID) | INTRAMUSCULAR | Status: DC
  Administered 2021-03-14 – 2021-03-15 (×2): 3 [IU] via SUBCUTANEOUS
  Administered 2021-03-15 (×2): 2 [IU] via SUBCUTANEOUS
  Administered 2021-03-16: 5 [IU] via SUBCUTANEOUS
  Administered 2021-03-16 – 2021-03-19 (×4): 2 [IU] via SUBCUTANEOUS

## 2021-03-14 MED ORDER — ASPIRIN EC 325 MG PO TBEC
325.0000 mg | DELAYED_RELEASE_TABLET | Freq: Every day | ORAL | Status: AC
  Administered 2021-03-15 – 2021-03-18 (×4): 325 mg via ORAL
  Filled 2021-03-14 (×4): qty 1

## 2021-03-14 MED ORDER — SENNOSIDES-DOCUSATE SODIUM 8.6-50 MG PO TABS
1.0000 | ORAL_TABLET | Freq: Two times a day (BID) | ORAL | Status: DC | PRN
  Administered 2021-03-16 – 2021-03-17 (×2): 1 via ORAL
  Filled 2021-03-14 (×3): qty 1

## 2021-03-14 MED ORDER — FENOFIBRATE 160 MG PO TABS
160.0000 mg | ORAL_TABLET | Freq: Every day | ORAL | Status: DC
  Administered 2021-03-15 – 2021-03-23 (×9): 160 mg via ORAL
  Filled 2021-03-14 (×9): qty 1

## 2021-03-14 MED ORDER — ASPIRIN 325 MG PO TBEC: 325.0000 mg | DELAYED_RELEASE_TABLET | Freq: Every day | ORAL | 0 refills | Status: DC

## 2021-03-14 MED ORDER — BLOOD PRESSURE CONTROL BOOK
Freq: Once | Status: DC
  Filled 2021-03-14: qty 1

## 2021-03-14 MED ORDER — ALBUTEROL SULFATE (2.5 MG/3ML) 0.083% IN NEBU
2.5000 mg | INHALATION_SOLUTION | Freq: Two times a day (BID) | RESPIRATORY_TRACT | Status: DC
  Administered 2021-03-14 – 2021-03-19 (×10): 2.5 mg via RESPIRATORY_TRACT
  Filled 2021-03-14 (×14): qty 3

## 2021-03-14 MED ORDER — ENOXAPARIN SODIUM 60 MG/0.6ML IJ SOSY: 50.0000 mg | PREFILLED_SYRINGE | INTRAMUSCULAR | Status: DC

## 2021-03-14 MED ORDER — ALBUTEROL SULFATE (2.5 MG/3ML) 0.083% IN NEBU
2.5000 mg | INHALATION_SOLUTION | Freq: Four times a day (QID) | RESPIRATORY_TRACT | Status: DC | PRN
  Administered 2021-03-14 – 2021-03-23 (×6): 2.5 mg via RESPIRATORY_TRACT
  Filled 2021-03-14 (×6): qty 3

## 2021-03-14 MED ORDER — ONDANSETRON HCL 4 MG PO TABS
4.0000 mg | ORAL_TABLET | Freq: Four times a day (QID) | ORAL | Status: DC | PRN
Start: 2021-03-14 — End: 2021-03-23

## 2021-03-14 MED ORDER — LORATADINE 10 MG PO TABS
10.0000 mg | ORAL_TABLET | Freq: Every day | ORAL | Status: DC
  Administered 2021-03-15 – 2021-03-23 (×9): 10 mg via ORAL
  Filled 2021-03-14 (×9): qty 1

## 2021-03-14 MED ORDER — ONDANSETRON HCL 4 MG/2ML IJ SOLN: 4.0000 mg | Freq: Four times a day (QID) | INTRAMUSCULAR | Status: DC | PRN

## 2021-03-14 MED ORDER — BUTALBITAL-APAP-CAFFEINE 50-325-40 MG PO TABS
1.0000 | ORAL_TABLET | Freq: Four times a day (QID) | ORAL | Status: DC | PRN
  Administered 2021-03-14 – 2021-03-21 (×3): 1 via ORAL
  Filled 2021-03-14 (×3): qty 1

## 2021-03-14 MED ORDER — METOPROLOL SUCCINATE ER 50 MG PO TB24
50.0000 mg | ORAL_TABLET | Freq: Every day | ORAL | Status: DC
  Administered 2021-03-15 – 2021-03-23 (×9): 50 mg via ORAL
  Filled 2021-03-14 (×9): qty 1

## 2021-03-14 MED ORDER — MELATONIN 3 MG PO TABS
6.0000 mg | ORAL_TABLET | Freq: Every day | ORAL | Status: DC
  Administered 2021-03-14 – 2021-03-22 (×9): 6 mg via ORAL
  Filled 2021-03-14 (×9): qty 2

## 2021-03-14 MED ORDER — DILTIAZEM HCL ER 120 MG PO CP24
120.0000 mg | ORAL_CAPSULE | Freq: Two times a day (BID) | ORAL | Status: DC
  Filled 2021-03-14 (×3): qty 1

## 2021-03-14 MED ORDER — APIXABAN 5 MG PO TABS: 5.0000 mg | ORAL_TABLET | Freq: Two times a day (BID) | ORAL | Status: DC

## 2021-03-14 MED ORDER — APIXABAN 5 MG PO TABS: 5.0000 mg | ORAL_TABLET | Freq: Two times a day (BID) | ORAL | 2 refills | Status: DC

## 2021-03-14 MED ORDER — PRAVASTATIN SODIUM 40 MG PO TABS: 40.0000 mg | ORAL_TABLET | Freq: Every day | ORAL | 0 refills | Status: DC

## 2021-03-14 MED ORDER — DILTIAZEM HCL ER COATED BEADS 120 MG PO CP24
120.0000 mg | ORAL_CAPSULE | Freq: Every day | ORAL | Status: DC
  Administered 2021-03-15 – 2021-03-23 (×9): 120 mg via ORAL
  Filled 2021-03-14 (×9): qty 1

## 2021-03-14 MED ORDER — ENOXAPARIN SODIUM 60 MG/0.6ML IJ SOSY
50.0000 mg | PREFILLED_SYRINGE | INTRAMUSCULAR | Status: AC
  Administered 2021-03-15 – 2021-03-18 (×4): 50 mg via SUBCUTANEOUS
  Filled 2021-03-14 (×4): qty 0.6

## 2021-03-14 MED ORDER — ACETAMINOPHEN 650 MG RE SUPP: 650.0000 mg | Freq: Four times a day (QID) | RECTAL | Status: DC | PRN

## 2021-03-14 MED ORDER — LIVING WELL WITH DIABETES BOOK
Freq: Once | Status: DC
  Filled 2021-03-14: qty 1

## 2021-03-14 NOTE — PMR Pre-admission (Signed)
PMR Admission Coordinator Pre-Admission Assessment  Patient: Randall Sullivan is an 70 y.o., male MRN: [id number] DOB: Apr 27, 1951 Height: 5\' 10"  (177.8 cm) Weight: 100 kg  Insurance Information HMO:     PPO:      PCP:      IPA:      80/20: yes     OTHER:  PRIMARY: Medicare part A an B      Policy#: [id number]      Subscriber: Pt Phone#: Verified online    Fax#:  Pre-Cert#:       Employer:  Benefits:  Phone #:      Name:  Eff. Date: Part A  effective 06/01/15 abd Part  B effective 03/30/16  Deduct: $1556      Out of Pocket Max:  None      Life Max: N/A  CIR: 100%      SNF: 100 days Outpatient: 80%     Co-Pay: 20% Home Health: 100%      Co-Pay: none DME: 80%     Co-Pay: 20% Providers: patient's choice SECONDARY:       Policy#:      Phone#:   Development worker, community:       Phone#:   The Therapist, art Information Summary" for patients in Inpatient Rehabilitation Facilities with attached "Privacy Act [place] Records" was provided and verbally reviewed with: Family  Emergency Contact Information Contact Information     Name Relation Home Work Mobile   Klose,Lisa Spouse [phone]       Current Medical History  Patient Admitting Diagnosis: CVA History of Present Illness:Randall Sullivan is a 70 year old right-handed male history of PAF maintained on aspirin, lung cancer with VATS procedure 12/21/2018 on the on the right and VATS 08/02/2019 on the left., emphysema on chronic O2 at 2 L, tobacco abuse, obesity with BMI 31.63, hypertension, hyperlipidemia.  Per chart review patient lives with spouse.  Independent community ambulator.  1 level home 3 steps to entry.  Presented to [facility] with acute onset of right side weakness/aphasia and nonradiating chest pain or shortness of breath.  CT/MRI/MRI of the head showed acute large left PCA territory infarction.  Left PCA occlusion at the P1-P2 junction.  Small acute right thalamic infarction.  No hemorrhage or  mass-effect.  Patient did not receive tPA.  Carotid ultrasound with no ICA stenosis.  Chest x-ray no active disease.  Echocardiogram ejection fraction of 50 to 72% grade 1 diastolic dysfunction.  Admission chemistries alcohol negative, sodium 134, potassium 3.4, creatinine 1.04, BNP 40, MRSA PCR screening negative, urine drug screen negative, hemoglobin A1c 7.5.  Currently maintained on aspirin 325 mg daily for CVA prophylaxis.  Subcutaneous Lovenox for DVT prophylaxis.  Patient with new findings diabetes mellitus hemoglobin A1c 7.5 placed on SSI.  Tolerating a regular consistency diet.  Therapy evaluations completed due to patient's right side weakness was admitted for a comprehensive rehab program.      Patient's medical record from [facility] has been reviewed by the rehabilitation admission coordinator and physician.  Past Medical History  Past Medical History:  Diagnosis Date   A-fib ([place]) 2017   after last lung surgery patient had a history if Afib documented in hospital    Allergy    Arthritis    back & legs ( knees)   Cancer ([place])    lung   Complication of anesthesia    agitated upon waking from anesth.    Emphysema    History of lung  cancer    Hyperlipidemia    Hypertension    Oxygen dependent    2L continuous   Tobacco abuse     Family History   family history includes Aneurysm in his sister; Asthma in his brother; Cancer in his brother, mother, and sister; Cancer - Lung in his brother; Kidney disease in his paternal aunt.  Prior Rehab/Hospitalizations Has the patient had prior rehab or hospitalizations prior to admission? No  Has the patient had major surgery during 100 days prior to admission? No   Current Medications  Current Facility-Administered Medications:    acetaminophen (TYLENOL) tablet 650 mg, 650 mg, Oral, Q6H PRN, 650 mg at 03/14/21 0800 **OR** acetaminophen (TYLENOL) suppository 650 mg, 650 mg, Rectal, Q6H PRN, Reubin Milan, MD    albuterol (PROVENTIL) (2.5 MG/3ML) 0.083% nebulizer solution 2.5 mg, 2.5 mg, Nebulization, Q6H PRN, Reubin Milan, MD, 2.5 mg at 03/13/21 1708   albuterol (PROVENTIL) (2.5 MG/3ML) 0.083% nebulizer solution 2.5 mg, 2.5 mg, Nebulization, BID, Manuella Ghazi, Pratik D, DO, 2.5 mg at 03/14/21 1610   aspirin EC tablet 325 mg, 325 mg, Oral, Daily, Manuella Ghazi, Pratik D, DO, 325 mg at 03/14/21 9604   butalbital-acetaminophen-caffeine (FIORICET) 50-325-40 MG per tablet 1 tablet, 1 tablet, Oral, Q6H PRN, Manuella Ghazi, Pratik D, DO   Chlorhexidine Gluconate Cloth 2 % PADS 6 each, 6 each, Topical, Daily, Manuella Ghazi, Pratik D, DO   cholecalciferol (VITAMIN D3) tablet 5,000 Units, 5,000 Units, Oral, Daily, Reubin Milan, MD, 5,000 Units at 03/14/21 0933   diltiazem (CARDIZEM) 125 mg in dextrose 5% 125 mL (1 mg/mL) infusion, 5-15 mg/hr, Intravenous, Titrated, Manuella Ghazi, Pratik D, DO   diltiazem (DILACOR XR) 24 hr capsule 120 mg, 120 mg, Oral, BID, Manuella Ghazi, Pratik D, DO, 120 mg at 03/14/21 0933   enoxaparin (LOVENOX) injection 50 mg, 50 mg, Subcutaneous, Q24H, Reubin Milan, MD, 50 mg at 03/14/21 5409   fenofibrate tablet 160 mg, 160 mg, Oral, Daily, Reubin Milan, MD, 160 mg at 03/14/21 0934   insulin aspart (novoLOG) injection 0-15 Units, 0-15 Units, Subcutaneous, TID WC, Reubin Milan, MD, 2 Units at 03/14/21 0800   labetalol (NORMODYNE) injection 10 mg, 10 mg, Intravenous, Q2H PRN, Manuella Ghazi, Pratik D, DO   loratadine (CLARITIN) tablet 10 mg, 10 mg, Oral, Daily, Reubin Milan, MD, 10 mg at 03/14/21 8119   magnesium hydroxide (MILK OF MAGNESIA) suspension 30 mL, 30 mL, Oral, Daily PRN, Reubin Milan, MD   melatonin tablet 6 mg, 6 mg, Oral, QHS, Shah, Pratik D, DO, 6 mg at 03/13/21 2149   metoprolol succinate (TOPROL-XL) 24 hr tablet 50 mg, 50 mg, Oral, Daily, Manuella Ghazi, Pratik D, DO, 50 mg at 03/14/21 0934   ondansetron (ZOFRAN) tablet 4 mg, 4 mg, Oral, Q6H PRN **OR** ondansetron (ZOFRAN) injection 4 mg, 4 mg,  Intravenous, Q6H PRN, Reubin Milan, MD   pravastatin (PRAVACHOL) tablet 20 mg, 20 mg, Oral, Daily, Reubin Milan, MD, 20 mg at 03/14/21 1478   senna-docusate (Senokot-S) tablet 1 tablet, 1 tablet, Oral, BID PRN, Reubin Milan, MD  Patients Current Diet:  Diet Order             Diet Heart Room service appropriate? Yes; Fluid consistency: Thin  Diet effective now                   Precautions / Restrictions Precautions Precautions: Fall Restrictions Weight Bearing Restrictions: No   Has the patient had 2 or more falls or a fall  with injury in the past year? No  Prior Activity Level Community (5-7x/wk): Pt. was active in the community PTA  Prior Functional Level Self Care: Did the patient need help bathing, dressing, using the toilet or eating? Independent  Indoor Mobility: Did the patient need assistance with walking from room to room (with or without device)? Independent  Stairs: Did the patient need assistance with internal or external stairs (with or without device)? Independent  Functional Cognition: Did the patient need help planning regular tasks such as shopping or remembering to take medications? Independent  Home Assistive Devices / Equipment Home Assistive Devices/Equipment: None Home Equipment: Cane - single point  Prior Device Use: Indicate devices/aids used by the patient prior to current illness, exacerbation or injury? None of the above  Current Functional Level Cognition  Arousal/Alertness: Awake/alert Overall Cognitive Status: Impaired/Different from baseline Orientation Level: Oriented X4 General Comments: Patient is slighly impulsive during ambulation. Memory: Appears intact Awareness: Impaired Awareness Impairment: Emergent impairment (does not appear to recognize visual impairments) Problem Solving: Impaired Problem Solving Impairment: Verbal complex Executive Function: Organizing, Self Monitoring Organizing:  Impaired Organizing Impairment: Verbal complex Self Monitoring: Impaired Self Monitoring Impairment: Verbal complex Behaviors: Perseveration (occasional perseverative responses) Safety/Judgment: Impaired Comments: to be further assessed    Extremity Assessment (includes Sensation/Coordination)  Upper Extremity Assessment: RUE deficits/detail, LUE deficits/detail RUE Deficits / Details: WFL A/ROM. 3+/5 shoulder flexion, 4/5 shoulder abduction, 4+/5 elbow flexion, 4/5 elbow extension, 4+/5 wrist flexion and extension, 4+5 grip strength. RUE Coordination: decreased fine motor, decreased gross motor LUE Deficits / Details: WFL 5/5 gross MMT LUE Coordination: WNL  Lower Extremity Assessment: Defer to PT evaluation RLE Deficits / Details: 3+/5 for Dorsiflexors, knee extensors, knee flexors, hip flexors RLE Coordination: decreased gross motor    ADLs  Overall ADL's : Needs assistance/impaired Eating/Feeding: Modified independent, Independent, Sitting Grooming: Set up, Supervision/safety, Sitting Upper Body Bathing: Supervision/ safety, Sitting Lower Body Bathing: Minimal assistance, Sit to/from stand, Sitting/lateral leans, With adaptive equipment Upper Body Dressing : Supervision/safety, Sitting Lower Body Dressing: Minimal assistance, Sit to/from stand, Min guard, With adaptive equipment Toilet Transfer: Minimal assistance, RW, Stand-pivot, Ambulation Toilet Transfer Details (indicate cue type and reason): simulated via ambulatory transfer from EOB to chair Toileting- Clothing Manipulation and Hygiene: Minimal assistance, Min guard, Sit to/from stand Scientist, research (medical): Minimal assistance, Tub bench, Rolling walker Functional mobility during ADLs: Minimal assistance, Rolling walker General ADL Comments: Clinical judgement utilized to estimate assist level needed for ADL's. Pt did demonstrate donning and doffing socks with Mod I assist.    Mobility  Overal bed mobility: Modified  Independent General bed mobility comments: increased time and labored movements    Transfers  Overall transfer level: Needs assistance Equipment used: Rolling walker (2 wheeled) Transfers: Sit to/from Stand, [employer] Transfers Sit to Stand: Min assist, Min guard Stand pivot transfers: Min assist, Min guard General transfer comment: verbal and tactile cues for use of RW    Ambulation / Gait / Stairs / Wheelchair Mobility  Ambulation/Gait Ambulation/Gait assistance: Mod assist, Min assist Gait Distance (Feet): 70 Feet Assistive device: Rolling walker (2 wheeled) Gait Pattern/deviations: Decreased step length - right, Decreased stance time - right, Decreased stride length, Decreased weight shift to right, Step-through pattern General Gait Details: Patient required verbal cues to stand up straight and correct right lateral lean. Patient was able to step with a recriprical gait pattern Gait velocity: decreased    Posture / Balance Dynamic Sitting Balance Sitting balance - Comments: good/fair at EOB Balance  Overall balance assessment: Needs assistance Sitting-balance support: Bilateral upper extremity supported, Feet supported Sitting balance-Leahy Scale: Good Sitting balance - Comments: good/fair at EOB Postural control: Right lateral lean Standing balance support: Bilateral upper extremity supported, During functional activity Standing balance-Leahy Scale: Fair Standing balance comment: Fair/poor with RW    Special needs/care consideration None   Previous Home Environment (from acute therapy documentation) Living Arrangements: Spouse/significant other  Lives With: Spouse Available Help at Discharge: Family, Available 24 hours/day Type of Home: House Home Layout: One level Home Access: Stairs to enter Entrance Stairs-Rails: Left, Right, Can reach both Entrance Stairs-Number of Steps: 3 Bathroom Shower/Tub: Optometrist:  Yes Home Care Services: No  Discharge Living Setting Plans for Discharge Living Setting: Patient's home Type of Home at Discharge: House Discharge Home Layout: One level Discharge Home Access: Stairs to enter Entrance Stairs-Rails: Can reach both, Left, Right Entrance Stairs-Number of Steps: 3 Discharge Bathroom Shower/Tub: Tub/shower unit Discharge Bathroom Toilet: Standard Discharge Bathroom Accessibility: Yes How Accessible: Accessible via walker Does the patient have any problems obtaining your medications?: No  Social/Family/Support Systems Patient Roles: Spouse Contact Information: [phone] Anticipated Caregiver: Andersen Iorio (wife) Anticipated Caregiver's Contact Information: [phone] Ability/Limitations of Caregiver: can do min A; on leave from work Caregiver Availability: 24/7 Discharge Plan Discussed with Primary Caregiver: Yes Is Caregiver In Agreement with Plan?: Yes Does Caregiver/Family have Issues with Lodging/Transportation while Pt is in Rehab?: No  Goals Patient/Family Goal for Rehab: PT/OT/SLP min A Expected length of stay: 12-14 days Pt/Family Agrees to Admission and willing to participate: Yes Program Orientation Provided & Reviewed with Pt/Caregiver Including Roles  & Responsibilities: Yes  Decrease burden of Care through IP rehab admission: Patient/family education  Possible need for SNF placement upon discharge: not anticipated  Patient Condition: I have reviewed medical records from [facility], spoken with CM, and patient and spouse. I discussed via phone for inpatient rehabilitation assessment.  Patient will benefit from ongoing PT, OT, and SLP, can actively participate in 3 hours of therapy a day 5 days of the week, and can make measurable gains during the admission.  Patient will also benefit from the coordinated team approach during an Inpatient Acute Rehabilitation admission.  The patient will receive intensive therapy as well as  Rehabilitation physician, nursing, social worker, and care management interventions.  Due to safety, skin/wound care, disease management, medication administration, pain management, and patient education the patient requires 24 hour a day rehabilitation nursing.  The patient is currently min-mod A with mobility and basic ADLs.  Discharge setting and therapy post discharge at home with home health is anticipated.  Patient has agreed to participate in the Acute Inpatient Rehabilitation Program and will admit today.  Preadmission Screen Completed By:  Genella Mech, 03/14/2021 9:45 AM ______________________________________________________________________   Discussed status with Dr. Ranell Patrick on 03/14/21 at 27 and received approval for admission today.  Admission Coordinator:  Genella Mech, CCC-SLP, time 945 /Date 03/14/21   Assessment/Plan: Diagnosis:Left Posterior cerebral artery infarct Does the need for close, 24 hr/day Medical supervision in concert with the patient's rehab needs make it unreasonable for this patient to be served in a less intensive setting? Yes Co-Morbidities requiring supervision/potential complications: Afib, hx of lung Ca,emphysema-O2 dependant Due to bladder management, bowel management, safety, skin/wound care, disease management, medication administration, pain management, and patient education, does the patient require 24 hr/day rehab nursing? Yes Does the patient require coordinated care of a physician, rehab nurse, PT, OT, and  SLP to address physical and functional deficits in the context of the above medical diagnosis(es)? Yes Addressing deficits in the following areas: balance, endurance, locomotion, strength, transferring, bowel/bladder control, bathing, dressing, feeding, grooming, toileting, cognition, language, swallowing, and psychosocial support Can the patient actively participate in an intensive therapy program of at least 3 hrs of therapy 5 days a week? Yes The  potential for patient to make measurable gains while on inpatient rehab is good Anticipated functional outcomes upon discharge from inpatient rehab: supervision PT, supervision OT, supervision SLP Estimated rehab length of stay to reach the above functional goals is: 12-14d Anticipated discharge destination: Home 10. Overall Rehab/Functional Prognosis: good   MD Signature: Charlett Blake M.D. [employer] Fellow Am Acad of [employer] Med and Rehab Diplomate Am Board of Electrodiagnostic Med Fellow Am Board of Interventional Pain

## 2021-03-14 NOTE — Progress Notes (Signed)
Patient ID: Randall Sullivan, male   DOB: December 09, 1950, 70 y.o.   MRN: [id number] Met with the patient to introduce self and the role of the nurse CM. Discussed stroke risks including HTn, HLD (Trig 782) and new DM (A1C 7.5). Patient reports poor memory and unable to state what his usual diet was. Also reported visual changes. Reviewed ASA  change to Eliquis 03/19/21 per MD for PAF post stroke and understanding of vit D deficiency. Reviewed dietary modifications and medications for treatment of risks. Handouts given to patient for reference with handbooks to share with family. Continue to follow along to discharge to address educational needs and collaborate with the SW to facilitate preparation for discharge Margarito Liner

## 2021-03-14 NOTE — Progress Notes (Signed)
PMR Admission Coordinator Pre-Admission Assessment   Patient: Randall Sullivan is an 70 y.o., male MRN: [id number] DOB: 01-Dec-1950 Height: 5\' 10"  (177.8 cm) Weight: 100 kg   Insurance Information HMO:     PPO:      PCP:      IPA:      80/20: yes     OTHER: PRIMARY: Medicare part A an B      Policy#: [id number]      Subscriber: Pt Phone#: Verified online    Fax#: Pre-Cert#:       Employer: Benefits:  Phone #:      Name: Eff. Date: Part A  effective 06/01/15 abd Part  B effective 03/30/16  Deduct: $1556      Out of Pocket Max:  None      Life Max: N/A  CIR: 100%      SNF: 100 days Outpatient: 80%     Co-Pay: 20% Home Health: 100%      Co-Pay: none DME: 80%     Co-Pay: 20% Providers: patient's choice SECONDARY:       Policy#:      Phone#:   Development worker, community:       Phone#:   The Therapist, art Information Summary" for patients in Inpatient Rehabilitation Facilities with attached "Privacy Act [place] Records" was provided and verbally reviewed with: Family   Emergency Contact Information Contact Information       Name Relation Home Work Mobile    Strain,Lisa Spouse [phone]           Current Medical History  Patient Admitting Diagnosis: CVA History of Present Illness:Randall Sullivan is a 70 year old right-handed male history of PAF maintained on aspirin, lung cancer with VATS procedure 12/21/2018 on the on the right and VATS 08/02/2019 on the left., emphysema on chronic O2 at 2 L, tobacco abuse, obesity with BMI 31.63, hypertension, hyperlipidemia.  Per chart review patient lives with spouse.  Independent community ambulator.  1 level home 3 steps to entry.  Presented to [facility] with acute onset of right side weakness/aphasia and nonradiating chest pain or shortness of breath.  CT/MRI/MRI of the head showed acute large left PCA territory infarction.  Left PCA occlusion at the P1-P2 junction.  Small acute right thalamic infarction.  No  hemorrhage or mass-effect.  Patient did not receive tPA.  Carotid ultrasound with no ICA stenosis.  Chest x-ray no active disease.  Echocardiogram ejection fraction of 50 to 94% grade 1 diastolic dysfunction.  Admission chemistries alcohol negative, sodium 134, potassium 3.4, creatinine 1.04, BNP 40, MRSA PCR screening negative, urine drug screen negative, hemoglobin A1c 7.5.  Currently maintained on aspirin 325 mg daily for CVA prophylaxis.  Subcutaneous Lovenox for DVT prophylaxis.  Patient with new findings diabetes mellitus hemoglobin A1c 7.5 placed on SSI.  Tolerating a regular consistency diet.  Therapy evaluations completed due to patient's right side weakness was admitted for a comprehensive rehab program.     Patient's medical record from [facility] has been reviewed by the rehabilitation admission coordinator and physician.   Past Medical History      Past Medical History:  Diagnosis Date   A-fib ([place]) 2017    after last lung surgery patient had a history if Afib documented in hospital   Allergy     Arthritis      back & legs ( knees)   Cancer ([place])      lung   Complication of anesthesia  agitated upon waking from anesth.   Emphysema     History of lung cancer     Hyperlipidemia     Hypertension     Oxygen dependent      2L continuous   Tobacco abuse        Family History   family history includes Aneurysm in his sister; Asthma in his brother; Cancer in his brother, mother, and sister; Cancer - Lung in his brother; Kidney disease in his paternal aunt.   Prior Rehab/Hospitalizations Has the patient had prior rehab or hospitalizations prior to admission? No   Has the patient had major surgery during 100 days prior to admission? No               Current Medications   Current Facility-Administered Medications:   acetaminophen (TYLENOL) tablet 650 mg, 650 mg, Oral, Q6H PRN, 650 mg at 03/14/21 0800 **OR** acetaminophen (TYLENOL) suppository 650 mg, 650 mg,  Rectal, Q6H PRN, Reubin Milan, MD   albuterol (PROVENTIL) (2.5 MG/3ML) 0.083% nebulizer solution 2.5 mg, 2.5 mg, Nebulization, Q6H PRN, Reubin Milan, MD, 2.5 mg at 03/13/21 1708   albuterol (PROVENTIL) (2.5 MG/3ML) 0.083% nebulizer solution 2.5 mg, 2.5 mg, Nebulization, BID, Manuella Ghazi, Pratik D, DO, 2.5 mg at 03/14/21 0272   aspirin EC tablet 325 mg, 325 mg, Oral, Daily, Manuella Ghazi, Pratik D, DO, 325 mg at 03/14/21 5366   butalbital-acetaminophen-caffeine (FIORICET) 50-325-40 MG per tablet 1 tablet, 1 tablet, Oral, Q6H PRN, Manuella Ghazi, Pratik D, DO   Chlorhexidine Gluconate Cloth 2 % PADS 6 each, 6 each, Topical, Daily, Manuella Ghazi, Pratik D, DO   cholecalciferol (VITAMIN D3) tablet 5,000 Units, 5,000 Units, Oral, Daily, Reubin Milan, MD, 5,000 Units at 03/14/21 0933   diltiazem (CARDIZEM) 125 mg in dextrose 5% 125 mL (1 mg/mL) infusion, 5-15 mg/hr, Intravenous, Titrated, Manuella Ghazi, Pratik D, DO   diltiazem (DILACOR XR) 24 hr capsule 120 mg, 120 mg, Oral, BID, Manuella Ghazi, Pratik D, DO, 120 mg at 03/14/21 0933   enoxaparin (LOVENOX) injection 50 mg, 50 mg, Subcutaneous, Q24H, Reubin Milan, MD, 50 mg at 03/14/21 4403   fenofibrate tablet 160 mg, 160 mg, Oral, Daily, Reubin Milan, MD, 160 mg at 03/14/21 0934   insulin aspart (novoLOG) injection 0-15 Units, 0-15 Units, Subcutaneous, TID WC, Reubin Milan, MD, 2 Units at 03/14/21 0800   labetalol (NORMODYNE) injection 10 mg, 10 mg, Intravenous, Q2H PRN, Manuella Ghazi, Pratik D, DO   loratadine (CLARITIN) tablet 10 mg, 10 mg, Oral, Daily, Reubin Milan, MD, 10 mg at 03/14/21 4742   magnesium hydroxide (MILK OF MAGNESIA) suspension 30 mL, 30 mL, Oral, Daily PRN, Reubin Milan, MD   melatonin tablet 6 mg, 6 mg, Oral, QHS, Shah, Pratik D, DO, 6 mg at 03/13/21 2149   metoprolol succinate (TOPROL-XL) 24 hr tablet 50 mg, 50 mg, Oral, Daily, Manuella Ghazi, Pratik D, DO, 50 mg at 03/14/21 0934   ondansetron (ZOFRAN) tablet 4 mg, 4 mg, Oral, Q6H PRN **OR**  ondansetron (ZOFRAN) injection 4 mg, 4 mg, Intravenous, Q6H PRN, Reubin Milan, MD   pravastatin (PRAVACHOL) tablet 20 mg, 20 mg, Oral, Daily, Reubin Milan, MD, 20 mg at 03/14/21 5956   senna-docusate (Senokot-S) tablet 1 tablet, 1 tablet, Oral, BID PRN, Reubin Milan, MD   Patients Current Diet:  Diet Order                  Diet Heart Room service appropriate? Yes; Fluid consistency: Thin  Diet effective  now                         Precautions / Restrictions Precautions Precautions: Fall Restrictions Weight Bearing Restrictions: No    Has the patient had 2 or more falls or a fall with injury in the past year? No   Prior Activity Level Community (5-7x/wk): Pt. was active in the community PTA   Prior Functional Level Self Care: Did the patient need help bathing, dressing, using the toilet or eating? Independent   Indoor Mobility: Did the patient need assistance with walking from room to room (with or without device)? Independent   Stairs: Did the patient need assistance with internal or external stairs (with or without device)? Independent   Functional Cognition: Did the patient need help planning regular tasks such as shopping or remembering to take medications? Independent   Home Assistive Devices / Equipment Home Assistive Devices/Equipment: None Home Equipment: Cane - single point   Prior Device Use: Indicate devices/aids used by the patient prior to current illness, exacerbation or injury? None of the above   Current Functional Level Cognition   Arousal/Alertness: Awake/alert Overall Cognitive Status: Impaired/Different from baseline Orientation Level: Oriented X4 General Comments: Patient is slighly impulsive during ambulation. Memory: Appears intact Awareness: Impaired Awareness Impairment: Emergent impairment (does not appear to recognize visual impairments) Problem Solving: Impaired Problem Solving Impairment: Verbal complex Executive  Function: Organizing, Self Monitoring Organizing: Impaired Organizing Impairment: Verbal complex Self Monitoring: Impaired Self Monitoring Impairment: Verbal complex Behaviors: Perseveration (occasional perseverative responses) Safety/Judgment: Impaired Comments: to be further assessed    Extremity Assessment (includes Sensation/Coordination)   Upper Extremity Assessment: RUE deficits/detail, LUE deficits/detail RUE Deficits / Details: WFL A/ROM. 3+/5 shoulder flexion, 4/5 shoulder abduction, 4+/5 elbow flexion, 4/5 elbow extension, 4+/5 wrist flexion and extension, 4+5 grip strength. RUE Coordination: decreased fine motor, decreased gross motor LUE Deficits / Details: WFL 5/5 gross MMT LUE Coordination: WNL  Lower Extremity Assessment: Defer to PT evaluation RLE Deficits / Details: 3+/5 for Dorsiflexors, knee extensors, knee flexors, hip flexors RLE Coordination: decreased gross motor     ADLs   Overall ADL's : Needs assistance/impaired Eating/Feeding: Modified independent, Independent, Sitting Grooming: Set up, Supervision/safety, Sitting Upper Body Bathing: Supervision/ safety, Sitting Lower Body Bathing: Minimal assistance, Sit to/from stand, Sitting/lateral leans, With adaptive equipment Upper Body Dressing : Supervision/safety, Sitting Lower Body Dressing: Minimal assistance, Sit to/from stand, Min guard, With adaptive equipment Toilet Transfer: Minimal assistance, RW, Stand-pivot, Ambulation Toilet Transfer Details (indicate cue type and reason): simulated via ambulatory transfer from EOB to chair Toileting- Clothing Manipulation and Hygiene: Minimal assistance, Min guard, Sit to/from stand Scientist, research (medical): Minimal assistance, Tub bench, Rolling walker Functional mobility during ADLs: Minimal assistance, Rolling walker General ADL Comments: Clinical judgement utilized to estimate assist level needed for ADL's. Pt did demonstrate donning and doffing socks with Mod I  assist.     Mobility   Overal bed mobility: Modified Independent General bed mobility comments: increased time and labored movements     Transfers   Overall transfer level: Needs assistance Equipment used: Rolling walker (2 wheeled) Transfers: Sit to/from Stand, [employer] Transfers Sit to Stand: Min assist, Min guard Stand pivot transfers: Min assist, Min guard General transfer comment: verbal and tactile cues for use of RW     Ambulation / Gait / Stairs / Wheelchair Mobility   Ambulation/Gait Ambulation/Gait assistance: Mod assist, Min assist Gait Distance (Feet): 70 Feet Assistive device: Rolling walker (2 wheeled)  Gait Pattern/deviations: Decreased step length - right, Decreased stance time - right, Decreased stride length, Decreased weight shift to right, Step-through pattern General Gait Details: Patient required verbal cues to stand up straight and correct right lateral lean. Patient was able to step with a recriprical gait pattern Gait velocity: decreased     Posture / Balance Dynamic Sitting Balance Sitting balance - Comments: good/fair at EOB Balance Overall balance assessment: Needs assistance Sitting-balance support: Bilateral upper extremity supported, Feet supported Sitting balance-Leahy Scale: Good Sitting balance - Comments: good/fair at EOB Postural control: Right lateral lean Standing balance support: Bilateral upper extremity supported, During functional activity Standing balance-Leahy Scale: Fair Standing balance comment: Fair/poor with RW     Special needs/care consideration None    Previous Home Environment (from acute therapy documentation) Living Arrangements: Spouse/significant other  Lives With: Spouse Available Help at Discharge: Family, Available 24 hours/day Type of Home: House Home Layout: One level Home Access: Stairs to enter Entrance Stairs-Rails: Left, Right, Can reach both Entrance Stairs-Number of Steps: 3 Bathroom Shower/Tub:  Optometrist: Yes Home Care Services: No   Discharge Living Setting Plans for Discharge Living Setting: Patient's home Type of Home at Discharge: House Discharge Home Layout: One level Discharge Home Access: Stairs to enter Entrance Stairs-Rails: Can reach both, Left, Right Entrance Stairs-Number of Steps: 3 Discharge Bathroom Shower/Tub: Tub/shower unit Discharge Bathroom Toilet: Standard Discharge Bathroom Accessibility: Yes How Accessible: Accessible via walker Does the patient have any problems obtaining your medications?: No   Social/Family/Support Systems Patient Roles: Spouse Contact Information: [phone] Anticipated Caregiver: Kenson Groh (wife) Anticipated Caregiver's Contact Information: [phone] Ability/Limitations of Caregiver: can do min A; on leave from work Caregiver Availability: 24/7 Discharge Plan Discussed with Primary Caregiver: Yes Is Caregiver In Agreement with Plan?: Yes Does Caregiver/Family have Issues with Lodging/Transportation while Pt is in Rehab?: No   Goals Patient/Family Goal for Rehab: PT/OT/SLP min A Expected length of stay: 12-14 days Pt/Family Agrees to Admission and willing to participate: Yes Program Orientation Provided & Reviewed with Pt/Caregiver Including Roles  & Responsibilities: Yes   Decrease burden of Care through IP rehab admission: Patient/family education   Possible need for SNF placement upon discharge: not anticipated   Patient Condition: I have reviewed medical records from [facility], spoken with CM, and patient and spouse. I discussed via phone for inpatient rehabilitation assessment.  Patient will benefit from ongoing PT, OT, and SLP, can actively participate in 3 hours of therapy a day 5 days of the week, and can make measurable gains during the admission.  Patient will also benefit from the coordinated team approach during an Inpatient Acute  Rehabilitation admission.  The patient will receive intensive therapy as well as Rehabilitation physician, nursing, social worker, and care management interventions.  Due to safety, skin/wound care, disease management, medication administration, pain management, and patient education the patient requires 24 hour a day rehabilitation nursing.  The patient is currently min-mod A with mobility and basic ADLs.  Discharge setting and therapy post discharge at home with home health is anticipated.  Patient has agreed to participate in the Acute Inpatient Rehabilitation Program and will admit today.   Preadmission Screen Completed By:  Genella Mech, 03/14/2021 9:45 AM ______________________________________________________________________   Discussed status with Dr. Ranell Patrick on 03/14/21 at 78 and received approval for admission today.   Admission Coordinator:  Genella Mech, CCC-SLP, time 458 /Date 03/14/21    Assessment/Plan: Diagnosis:Left Posterior cerebral artery infarct Does the  need for close, 24 hr/day Medical supervision in concert with the patient's rehab needs make it unreasonable for this patient to be served in a less intensive setting? Yes Co-Morbidities requiring supervision/potential complications: Afib, hx of lung Ca,emphysema-O2 dependant Due to bladder management, bowel management, safety, skin/wound care, disease management, medication administration, pain management, and patient education, does the patient require 24 hr/day rehab nursing? Yes Does the patient require coordinated care of a physician, rehab nurse, PT, OT, and SLP to address physical and functional deficits in the context of the above medical diagnosis(es)? Yes Addressing deficits in the following areas: balance, endurance, locomotion, strength, transferring, bowel/bladder control, bathing, dressing, feeding, grooming, toileting, cognition, language, swallowing, and psychosocial support Can the patient actively participate in  an intensive therapy program of at least 3 hrs of therapy 5 days a week? Yes The potential for patient to make measurable gains while on inpatient rehab is good Anticipated functional outcomes upon discharge from inpatient rehab: supervision PT, supervision OT, supervision SLP Estimated rehab length of stay to reach the above functional goals is: 12-14d Anticipated discharge destination: Home 10. Overall Rehab/Functional Prognosis: good     MD Signature: Charlett Blake M.D. [place] [facility] Fellow Am Acad of Phys Med and Rehab Diplomate Am Board of Electrodiagnostic Med Fellow Am Board of Interventional Pain

## 2021-03-14 NOTE — Discharge Instructions (Addendum)
Inpatient Rehab Discharge Instructions  Randall Sullivan Discharge date and time: No discharge date for patient encounter.   Activities/Precautions/ Functional Status: Activity: activity as tolerated Diet: diabetic diet Wound Care: Routine skin checks Functional status:  ___ No restrictions     ___ Walk up steps independently ___ 24/7 supervision/assistance   ___ Walk up steps with assistance ___ Intermittent supervision/assistance  ___ Bathe/dress independently ___ Walk with walker     __x_ Bathe/dress with assistance ___ Walk Independently    ___ Shower independently ___ Walk with assistance    ___ Shower with assistance ___ No alcohol     ___ Return to work/school ________  COMMUNITY REFERRALS UPON DISCHARGE:    Home Health:   PT     OT     ST    RN                   [place] Equipment/Items Ordered:Rolling Walker                                                 Agency/Supplier:[employer]   Special Instructions:  Home health nurse to check INR 03/27/2021 results to [place] [facility] [phone] fax [phone]    No driving smoking or alcoholSTROKE/TIA DISCHARGE INSTRUCTIONS SMOKING Cigarette smoking nearly doubles your risk of having a stroke & is the single most alterable risk factor  If you smoke or have smoked in the last 12 months, you are advised to quit smoking for your health. Most of the excess cardiovascular risk related to smoking disappears within a year of stopping. Ask you doctor about anti-smoking medications [place] Quit Line: 1-800-QUIT NOW Free Smoking Cessation Classes [phone]  CHOLESTEROL Know your levels; limit fat & cholesterol in your diet  Lipid Panel     Component Value Date/Time   CHOL 242 (H) 03/12/2021 1725   CHOL 205 (H) 11/25/2019 0939   TRIG 782 (H) 03/12/2021 1725   HDL 29 (L) 03/12/2021 1725   HDL 30 (L) 11/25/2019 0939   CHOLHDL 8.3 03/12/2021 1725   VLDL UNABLE TO  CALCULATE IF TRIGLYCERIDE OVER 400 mg/dL 03/12/2021 1725   LDLCALC UNABLE TO CALCULATE IF TRIGLYCERIDE OVER 400 mg/dL 03/12/2021 1725   [place]  05/08/2020 1051     Comment:     . LDL cholesterol not calculated. Triglyceride levels greater than 400 mg/dL invalidate calculated LDL results. . Reference range: <100 . Desirable range <100 mg/dL for primary prevention;   <70 mg/dL for patients with CHD or diabetic patients  with > or = 2 CHD risk factors. [name] LDL-C is now calculated using the Martin-Hopkins  calculation, which is a validated novel method providing  better accuracy than the Friedewald equation in the  estimation of LDL-C.  Cresenciano Genre et al. Annamaria Helling. 3710;626(94): 2061-2068  (http://education.[employer].com/faq/FAQ164)      Many patients benefit from treatment even if their cholesterol is at goal. Goal: Total Cholesterol (CHOL) less than 160 Goal:  Triglycerides (TRIG) less than 150 Goal:  HDL greater than 40 Goal:  LDL (LDLCALC) less than 100   BLOOD PRESSURE American Stroke Association blood pressure target is less that 120/80 mm/Hg  Your discharge blood pressure is:    Monitor your blood pressure Limit your salt and alcohol intake Many individuals will require more than one  medication for high blood pressure  DIABETES (A1c is a blood sugar average for last 3 months) Goal HGBA1c is under 7% (HBGA1c is blood sugar average for last 3 months)  Diabetes:   Lab Results  Component Value Date   HGBA1C 7.5 (H) 03/12/2021    Your HGBA1c can be lowered with medications, healthy diet, and exercise. Check your blood sugar as directed by your physician Call your physician if you experience unexplained or low blood sugars.  PHYSICAL ACTIVITY/REHABILITATION Goal is 30 minutes at least 4 days per week  Activity: Increase activity slowly, Therapies: Physical Therapy: Home Health Return to work:  Activity decreases your risk of heart attack and stroke and makes your heart  stronger.  It helps control your weight and blood pressure; helps you relax and can improve your mood. Participate in a regular exercise program. Talk with your doctor about the best form of exercise for you (dancing, walking, swimming, cycling).  DIET/WEIGHT Goal is to maintain a healthy weight  Your discharge diet is:  Diet Order             Diet Heart Room service appropriate? Yes; Fluid consistency: Thin  Diet effective now                   liquids Your height is:    Your current weight is:   Your Body Mass Index (BMI) is:    Following the type of diet specifically designed for you will help prevent another stroke. Your goal weight range is:   Your goal Body Mass Index (BMI) is 19-24. Healthy food habits can help reduce 3 risk factors for stroke:  High cholesterol, hypertension, and excess weight.  RESOURCES Stroke/Support Group:  Call [phone]   STROKE EDUCATION PROVIDED/REVIEWED AND GIVEN TO PATIENT Stroke warning signs and symptoms How to activate emergency medical system (call 911). Medications prescribed at discharge. Need for follow-up after discharge. Personal risk factors for stroke. Pneumonia vaccine given: No Flu vaccine given: No My questions have been answered, the writing is legible, and I understand these instructions.  I will adhere to these goals & educational materials that have been provided to me after my discharge from the hospital.       My questions have been answered and I understand these instructions. I will adhere to these goals and the provided educational materials after my discharge from the hospital.  Patient/Caregiver Signature _______________________________ Date __________  Clinician Signature _______________________________________ Date __________  Please bring this form and your medication list with you to all your follow-up doctor's appointments.

## 2021-03-14 NOTE — H&P (Addendum)
Physical Medicine and Rehabilitation Admission H&P        Chief Complaint  Patient presents with   Shortness of Breath  : HPI: Randall Sullivan is a 70 year old right-handed male history of PAF maintained on aspirin, lung cancer with VATS procedure 12/21/2018 on the on the right and VATS 08/02/2019 on the left., emphysema on chronic O2 at 2 L, tobacco abuse, obesity with BMI 31.63, hypertension, hyperlipidemia.  Per chart review patient lives with spouse.  Independent community ambulator.  1 level home 3 steps to entry.  Presented to [facility] 03/12/21 with acute onset of right side weakness/aphasia and nonradiating chest pain or shortness of breath.  CT/MRI/MRI of the head showed acute large left PCA territory infarction.  Left PCA occlusion at the P1-P2 junction.  Small acute right thalamic infarction.  No hemorrhage or mass-effect.  Patient did not receive tPA.  Carotid ultrasound with no ICA stenosis.  Chest x-ray no active disease.  Echocardiogram ejection fraction of 50 to 60% grade 1 diastolic dysfunction.  Admission chemistries alcohol negative, sodium 134, potassium 3.4, creatinine 1.04, BNP 40, MRSA PCR screening negative, urine drug screen negative, hemoglobin A1c 7.5.  Currently maintained on aspirin 325 mg daily for CVA prophylaxis and then transition to ELIQUIS 5 mg BID beginning 03/19/2021..  Subcutaneous Lovenox for DVT prophylaxis.  Patient with new findings diabetes mellitus hemoglobin A1c 7.5 placed on SSI.  Tolerating a regular consistency diet.  Therapy evaluations completed due to patient's right side weakness was admitted for a comprehensive rehab program.   Review of Systems Constitutional:  Negative for chills and fever. HENT:  Negative for hearing loss.   Eyes:  Negative for blurred vision and double vision. Respiratory:  Positive for shortness of breath. Negative for cough and wheezing.   Cardiovascular:  Positive for palpitations and leg swelling. Negative for chest  pain. Gastrointestinal:  Positive for constipation. Negative for heartburn, nausea and vomiting. Genitourinary:  Positive for urgency. Negative for dysuria, flank pain and hematuria. Musculoskeletal:  Positive for joint pain and myalgias. Skin:  Negative for rash. Neurological:  Positive for headaches.  All other systems reviewed and are negative.     Past Medical History:  Diagnosis Date   A-fib ([place]) 2017    after last lung surgery patient had a history if Afib documented in hospital   Allergy     Arthritis      back & legs ( knees)   Cancer ([place])      lung   Complication of anesthesia      agitated upon waking from anesth.   Emphysema     History of lung cancer     Hyperlipidemia     Hypertension     Oxygen dependent      2L continuous   Tobacco abuse           Past Surgical History:  Procedure Laterality Date   HERNIA REPAIR Bilateral 7371    umbilical   VIDEO ASSISTED THORACOSCOPY Right 12/21/2018    Procedure: VIDEO ASSISTED THORACOSCOPY AND RESECTION OF BLEBS RIGHT LOWER LOBE;  Surgeon: [name], MD;  Location: [facility];  Service: Thoracic;  Laterality: Right;   VIDEO ASSISTED THORACOSCOPY (VATS)/ LOBECTOMY Left 08/01/2016    Procedure: VIDEO ASSISTED THORACOSCOPY (VATS)/ LEFT UPPER LOBECTOMY with lymph node sampling and onQ placement;  Surgeon: [name], MD;  Location: [place];  Service: Thoracic;  Laterality: Left;   VIDEO ASSISTED THORACOSCOPY (VATS)/WEDGE RESECTION Right 12/09/2018    Procedure:  VIDEO ASSISTED THORACOSCOPY (VATS)/WEDGE RESECTION;  Surgeon: [name], MD;  Location: [place];  Service: Thoracic;  Laterality: Right;   VIDEO BRONCHOSCOPY Bilateral 12/21/2018    Procedure: VIDEO BRONCHOSCOPY;  Surgeon: [name], MD;  Location: [place];  Service: Thoracic;  Laterality: Bilateral;   VIDEO BRONCHOSCOPY WITH ENDOBRONCHIAL NAVIGATION N/A 07/03/2016    Procedure: VIDEO BRONCHOSCOPY WITH ENDOBRONCHIAL NAVIGATION;  Surgeon:  [name], MD;  Location: [place];  Service: Thoracic;  Laterality: N/A;   VIDEO BRONCHOSCOPY WITH INSERTION OF INTERBRONCHIAL VALVE (IBV) N/A 12/11/2018    Procedure: VIDEO BRONCHOSCOPY WITH INSERTION OF INTERBRONCHIAL VALVE (IBV);  Surgeon: [name], MD;  Location: [facility] OR;  Service: Thoracic;  Laterality: N/A;   VIDEO BRONCHOSCOPY WITH INSERTION OF INTERBRONCHIAL VALVE (IBV) N/A 02/10/2019    Procedure: VIDEO BRONCHOSCOPY WITH REMOVAL OF INTERBRONCHIAL VALVE (IBV);  Surgeon: [name], MD;  Location: [facility] OR;  Service: Thoracic;  Laterality: N/A;         Family History  Problem Relation Age of Onset   Cancer Mother     Cancer Sister     Aneurysm Sister     Kidney disease Paternal Aunt     Asthma Brother     Cancer - Lung Brother     Cancer Brother      Social History:  reports that he quit smoking about 4 years ago. His smoking use included cigarettes. He has a 40.00 pack-year smoking history. He has never used smokeless tobacco. He reports previous alcohol use. He reports that he does not use drugs. Allergies:      Allergies  Allergen Reactions   No Known Allergies            Medications Prior to Admission  Medication Sig Dispense Refill   acetaminophen (TYLENOL) 500 MG tablet Take 1,000 mg by mouth every 6 (six) hours as needed (for arthritis pain.).       albuterol (PROVENTIL) (2.5 MG/3ML) 0.083% nebulizer solution Take 3 mLs (2.5 mg total) by nebulization every 6 (six) hours as needed for wheezing or shortness of breath. 180 mL 3   aspirin EC 81 MG tablet Take 81 mg by mouth daily.       chlorthalidone (HYGROTON) 25 MG tablet Take 1 tablet (25 mg total) by mouth daily. 90 tablet 3   Cholecalciferol (VITAMIN D3) 125 MCG (5000 UT) CAPS Take 1 capsule by mouth daily.       diltiazem (DILT-XR) 120 MG 24 hr capsule Take 1 capsule (120 mg total) by mouth 2 (two) times daily. 60 capsule 3   fenofibrate (TRICOR) 145 MG tablet Take 1 tablet (145 mg total) by  mouth daily. 90 tablet 1   metoprolol succinate (TOPROL-XL) 50 MG 24 hr tablet Take 1 tablet (50 mg total) by mouth daily. Take with or immediately following a meal. 90 tablet 1   OXYGEN Inhale 2-3 L into the lungs continuous. Hx of lung ca, copd, and emphysema       pravastatin (PRAVACHOL) 20 MG tablet Take 1 tablet (20 mg total) by mouth daily. 90 tablet 1   senna-docusate (SENOKOT S) 8.6-50 MG tablet Take 1 tablet by mouth 2 (two) times daily as needed for mild constipation or moderate constipation. 90 tablet 0   magnesium hydroxide (MILK OF MAGNESIA) 400 MG/5ML suspension Take 30 mLs by mouth daily as needed for mild constipation. (Patient not taking: Reported on 03/12/2021) 118 mL 0      Drug Regimen Review Drug  regimen was reviewed and remains appropriate with no significant issues identified   Home: Home Living Family/patient expects to be discharged to:: Private residence Living Arrangements: Spouse/significant other Available Help at Discharge: Family, Available 24 hours/day Type of Home: House Home Access: Stairs to enter [employer] of Steps: 3 Entrance Stairs-Rails: Left, Right, Can reach both Home Layout: One level Bathroom Shower/Tub: Chiropodist: Standard Bathroom Accessibility: Yes Home Equipment: Radio producer - single point  Lives With: Spouse   Functional History: Prior Function Level of Independence: Independent 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 Transfers Sit to Stand: Min assist, Min guard Stand pivot transfers: Min assist, Min guard General transfer comment: verbal and tactile cues for use of RW Ambulation/Gait Ambulation/Gait  assistance: Mod assist, Min assist Gait Distance (Feet): 70 Feet Assistive device: Rolling walker (2 wheeled) Gait Pattern/deviations: Decreased step length - right, Decreased stance time - right, Decreased stride length, Decreased weight shift to right, Step-through pattern General Gait Details: Patient required verbal cues to stand up straight and correct right lateral lean. Patient was able to step with a recriprical gait pattern Gait velocity: decreased   ADL: ADL Overall ADL's : Needs assistance/impaired Eating/Feeding: Modified independent, Independent, Sitting Grooming: Set up, Supervision/safety, Sitting Upper Body Bathing: Supervision/ safety, Sitting Lower Body Bathing: Minimal assistance, Sit to/from stand, Sitting/lateral leans, With adaptive equipment Upper Body Dressing : Supervision/safety, Sitting Lower Body Dressing: Minimal assistance, Sit to/from stand, Min guard, With adaptive equipment Toilet Transfer: Minimal assistance, RW, Stand-pivot, Ambulation Toilet Transfer Details (indicate cue type and reason): simulated via ambulatory transfer from EOB to chair Toileting- Clothing Manipulation and Hygiene: Minimal assistance, Min guard, Sit to/from stand Tub/ Shower Transfer: Minimal assistance, Tub bench, Rolling walker Functional mobility during ADLs: Minimal assistance, Rolling walker General ADL Comments: Clinical judgement utilized to estimate assist level needed for ADL's. Pt did demonstrate donning and doffing socks with Mod I assist.   Cognition: Cognition Overall Cognitive Status: Impaired/Different from baseline Arousal/Alertness: Awake/alert Orientation Level: Oriented X4 Memory: Appears intact Awareness: Impaired Awareness Impairment: Emergent impairment (does not appear to recognize visual impairments) Problem Solving: Impaired Problem Solving Impairment: Verbal complex Executive Function: Organizing, Self Monitoring Organizing: Impaired Organizing  Impairment: Verbal complex Self Monitoring: Impaired Self Monitoring Impairment: Verbal complex Behaviors: Perseveration (occasional perseverative responses) Safety/Judgment: Impaired Comments: to be further assessed Cognition Arousal/Alertness: Awake/alert Behavior During Therapy: WFL for tasks assessed/performed, Impulsive Overall Cognitive Status: Impaired/Different from baseline General Comments: Patient is slighly impulsive during ambulation.   Physical Exam: Blood pressure (!) 157/97, pulse 91, temperature 98.4 F (36.9 C), temperature source Oral, resp. rate 19, height 5\' 10"  (1.778 m), weight 100 kg, SpO2 96 %. Physical Exam Neurological:    Comments: Patient is alert sitting edge of bed.  Expressive receptive aphasia and easily becomes frustrated.  He does use some short word phrases and can provide his name but had difficulty with age place and date of birth.  Follows some simple commands.    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 No joint swelling    Lab Results Last 48 Hours        Results for orders placed or performed during the hospital encounter  of 03/12/21 (from the past 48 hour(s))  Glucose, capillary     Status: Abnormal    Collection Time: 03/12/21 11:38 AM  Result Value Ref Range    Glucose-Capillary 164 (H) 70 - 99 mg/dL      Comment: Glucose reference range applies only to samples taken after fasting for at least 8 hours.  Glucose, capillary     Status: Abnormal    Collection Time: 03/12/21  4:28 PM  Result Value Ref Range    Glucose-Capillary 118 (H) 70 - 99 mg/dL      Comment: Glucose reference range applies only to samples taken after fasting for at least 8 hours.  Hemoglobin A1c     Status: Abnormal    Collection Time: 03/12/21  5:25 PM  Result Value Ref Range    Hgb A1c MFr Bld 7.5 (H) 4.8 - 5.6 %      Comment: (NOTE)         Prediabetes: 5.7 - 6.4         Diabetes: >6.4         Glycemic control for adults with diabetes: <7.0      Mean Plasma Glucose 169 mg/dL      Comment: (NOTE) Performed At: [facility] [place], [place] [id number] Rush Farmer MD [id number]    Lipid panel     Status: Abnormal    Collection Time: 03/12/21  5:25 PM  Result Value Ref Range    Cholesterol 242 (H) 0 - 200 mg/dL    Triglycerides 782 (H) <150 mg/dL    HDL 29 (L) >40 mg/dL    Total CHOL/HDL Ratio 8.3 RATIO    VLDL UNABLE TO CALCULATE IF TRIGLYCERIDE OVER 400 mg/dL 0 - 40 mg/dL    LDL Cholesterol UNABLE TO CALCULATE IF TRIGLYCERIDE OVER 400 mg/dL 0 - 99 mg/dL      Comment:        Total Cholesterol/HDL:CHD Risk Coronary Heart Disease Risk Table                     Men   Women  1/2 Average Risk   3.4   3.3  Average Risk       5.0   4.4  2 X Average Risk   9.6   7.1  3 X Average Risk  23.4   11.0        Use the calculated Patient Ratio above and the CHD Risk Table to determine the patient's CHD Risk.        ATP III CLASSIFICATION (LDL):  <100     mg/dL   Optimal  100-129  mg/dL   Near or Above                    Optimal  130-159  mg/dL   Borderline  160-189  mg/dL   High  >190     mg/dL    Very High Performed at [facility], [address], [place], [place] [address]    LDL cholesterol, direct     Status: None    Collection Time: 03/12/21  5:25 PM  Result Value Ref Range    Direct LDL 75.9 0 - 99 mg/dL      Comment: Performed at [facility] [address], [place], [place] [address]  Glucose, capillary     Status: Abnormal    Collection Time: 03/12/21  9:59 PM  Result Value Ref Range    Glucose-Capillary 116 (H) 70 - 99 mg/dL      Comment: Glucose reference range applies only to samples taken after fasting for at least 8 hours.  Basic metabolic panel     Status: Abnormal    Collection Time: 03/13/21  4:09 AM  Result Value Ref Range    Sodium 135 135 - 145 mmol/L    Potassium 3.6 3.5 - 5.1 mmol/L    Chloride 97 (L) 98 - 111 mmol/L    CO2 29 22 - 32 mmol/L    Glucose, Bld 129 (H) 70 - 99 mg/dL      Comment: Glucose reference range applies only to samples taken after fasting for at least 8 hours.    BUN 19 8 - 23 mg/dL    Creatinine, Ser 1.09 0.61 - 1.24 mg/dL    Calcium 9.6 8.9 - 10.3 mg/dL    GFR, Estimated >60 >60 mL/min      Comment: (NOTE) Calculated using the CKD-EPI Creatinine Equation (2021)      Anion gap 9 5 - 15      Comment: Performed at [facility], [address], [place], [place] [address]  Magnesium     Status: None    Collection Time: 03/13/21  4:09 AM  Result Value Ref Range    Magnesium 1.8 1.7 - 2.4 mg/dL      Comment: Performed at [facility], [address], [place], [place] [address]  CBC     Status: Abnormal    Collection Time: 03/13/21  4:09 AM  Result Value Ref Range    WBC 10.6 (H) 4.0 - 10.5 K/uL    RBC 5.38 4.22 - 5.81 MIL/uL    Hemoglobin 14.9 13.0 - 17.0 g/dL    HCT 46.8 39.0 - 52.0 %    MCV 87.0 80.0 - 100.0 fL    MCH 27.7 26.0 - 34.0 pg    MCHC 31.8 30.0 - 36.0 g/dL    RDW 14.1 11.5 - 15.5 %    Platelets 216 150 - 400 K/uL    nRBC 0.0 0.0 - 0.2 %      Comment: Performed at [facility], [address],  [place], [place] [address]  Glucose, capillary     Status: Abnormal    Collection Time: 03/13/21  7:13 AM  Result Value Ref Range    Glucose-Capillary 178 (H) 70 - 99 mg/dL      Comment: Glucose reference range applies only to samples taken after fasting for at least 8 hours.  Glucose, capillary     Status: Abnormal    Collection Time: 03/13/21 11:19 AM  Result Value Ref Range    Glucose-Capillary 135 (H) 70 - 99 mg/dL      Comment: Glucose reference range applies only to samples taken after fasting for at least 8 hours.  Glucose, capillary  Status: Abnormal    Collection Time: 03/13/21  4:25 PM  Result Value Ref Range    Glucose-Capillary 118 (H) 70 - 99 mg/dL      Comment: Glucose reference range applies only to samples taken after fasting for at least 8 hours.  Blood gas, venous     Status: Abnormal    Collection Time: 03/13/21  9:46 PM  Result Value Ref Range    FIO2 26.00      pH, Ven 7.395 7.250 - 7.430    pCO2, Ven 54.7 44.0 - 60.0 mmHg    pO2, Ven 31.5 (LL) 32.0 - 45.0 mmHg      Comment: CRITICAL RESULT CALLED TO, READ BACK BY AND VERIFIED WITH: HEARN,J ON 03/13/21 AT 2215 BY LOY,C      Bicarbonate 28.9 (H) 20.0 - 28.0 mmol/L    Acid-Base Excess 7.8 (H) 0.0 - 2.0 mmol/L    O2 Saturation 52.4 %    Patient temperature 36.8        Comment: Performed at [facility], [address], [place], [place] [address]  Glucose, capillary     Status: Abnormal    Collection Time: 03/13/21 10:52 PM  Result Value Ref Range    Glucose-Capillary 148 (H) 70 - 99 mg/dL      Comment: Glucose reference range applies only to samples taken after fasting for at least 8 hours.  Glucose, capillary     Status: Abnormal    Collection Time: 03/14/21  7:42 AM  Result Value Ref Range    Glucose-Capillary 144 (H) 70 - 99 mg/dL      Comment: Glucose reference range applies only to samples taken after fasting for at least 8 hours.  CBC     Status: None    Collection Time: 03/14/21  8:59 AM  Result Value  Ref Range    WBC 9.7 4.0 - 10.5 K/uL    RBC 5.34 4.22 - 5.81 MIL/uL    Hemoglobin 14.9 13.0 - 17.0 g/dL    HCT 46.9 39.0 - 52.0 %    MCV 87.8 80.0 - 100.0 fL    MCH 27.9 26.0 - 34.0 pg    MCHC 31.8 30.0 - 36.0 g/dL    RDW 13.9 11.5 - 15.5 %    Platelets 224 150 - 400 K/uL    nRBC 0.0 0.0 - 0.2 %      Comment: Performed at [facility], [address] [place], [place] [address]  Basic metabolic panel     Status: Abnormal    Collection Time: 03/14/21  8:59 AM  Result Value Ref Range    Sodium 134 (L) 135 - 145 mmol/L    Potassium 3.6 3.5 - 5.1 mmol/L    Chloride 95 (L) 98 - 111 mmol/L    CO2 29 22 - 32 mmol/L    Glucose, Bld 231 (H) 70 - 99 mg/dL      Comment: Glucose reference range applies only to samples taken after fasting for at least 8 hours.    BUN 24 (H) 8 - 23 mg/dL    Creatinine, Ser 1.25 (H) 0.61 - 1.24 mg/dL    Calcium 9.2 8.9 - 10.3 mg/dL    GFR, Estimated >60 >60 mL/min      Comment: (NOTE) Calculated using the CKD-EPI Creatinine Equation (2021)      Anion gap 10 5 - 15      Comment: Performed at [facility], [address], [place], [place] [address]  Magnesium  Status: None    Collection Time: 03/14/21  8:59 AM  Result Value Ref Range    Magnesium 1.7 1.7 - 2.4 mg/dL      Comment: Performed at [facility], [address], [place], [place] [address]  Procalcitonin - Baseline     Status: None    Collection Time: 03/14/21  8:59 AM  Result Value Ref Range    Procalcitonin 0.10 ng/mL      Comment:        Interpretation: PCT (Procalcitonin) <= 0.5 ng/mL: Systemic infection (sepsis) is not likely. Local bacterial infection is possible. (NOTE)       Sepsis PCT Algorithm           Lower Respiratory Tract                                      Infection PCT Algorithm    ----------------------------     ----------------------------         PCT < 0.25 ng/mL                PCT < 0.10 ng/mL           Strongly encourage             Strongly discourage    discontinuation of antibiotics    initiation of antibiotics    ----------------------------     -----------------------------       PCT 0.25 - 0.50 ng/mL            PCT 0.10 - 0.25 ng/mL               OR       >80% decrease in PCT            Discourage initiation of                                            antibiotics      Encourage discontinuation           of antibiotics    ----------------------------     -----------------------------         PCT >= 0.50 ng/mL              PCT 0.26 - 0.50 ng/mL               AND        <80% decrease in PCT             Encourage initiation of                                             antibiotics       Encourage continuation           of antibiotics    ----------------------------     -----------------------------        PCT >= 0.50 ng/mL                  PCT > 0.50 ng/mL               AND         increase in PCT  Strongly encourage                                      initiation of antibiotics    Strongly encourage escalation           of antibiotics                                     -----------------------------                                           PCT <= 0.25 ng/mL                                                 OR                                        > 80% decrease in PCT                                       Discontinue / Do not initiate                                             antibiotics   Performed at [facility], [address] [place],  [address]         Imaging Results (Last 48 hours)  MR ANGIO HEAD WO CONTRAST   Result Date: 03/12/2021 CLINICAL DATA:  Bilateral weakness EXAM: MRI HEAD WITHOUT CONTRAST MRA HEAD WITHOUT CONTRAST TECHNIQUE: Multiplanar, multi-echo pulse sequences of the brain and surrounding structures were acquired without intravenous contrast. Angiographic images of the Circle of Willis were acquired using MRA technique without intravenous contrast. COMPARISON:  MRI brain  07/23/2018 FINDINGS: MRI HEAD Brain: There is restricted diffusion in the left PCA territory involving the parasagittal occipital lobe and medial left temporal lobe. Subtle early changes are likely present in retrospect on the prior CT. There is additional involvement of the left thalamus. Minimal involvement of the contralateral right thalamus is also noted. There is no evidence of intracranial hemorrhage. Prominence of the ventricles and sulci reflects generalized parenchymal volume loss. Additional patchy T2 hyperintensity in the supratentorial white matter is nonspecific but may reflect minor chronic microvascular ischemic changes. 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 There is no intracranial mass or significant mass effect. There is no hydrocephalus or extra-axial fluid collection. Vascular: Major vessel flow voids at the skull base are preserved. Skull and upper cervical spine: Normal marrow signal is preserved. Sinuses/Orbits: Paranasal sinuses are aerated. Orbits are unremarkable. Other: Sella is unremarkable.  Mastoid air cells are clear. MRA HEAD Intracranial internal carotid arteries are patent with atherosclerotic irregularity. Middle and anterior cerebral arteries are patent. Intracranial vertebral arteries are patent. Basilar artery is patent. Right  posterior cerebral artery is patent. Left P1 PCA is patent. There is occlusion of the left PCA at the P1-P2 junction. No aneurysm. IMPRESSION: Acute large left PCA territory infarction. Left PCA occlusion at the P1-P2 junction. Small acute right thalamic infarct. No hemorrhage or mass effect. These results were called by telephone at the time of interpretation on 03/12/2021 at 10:18 am to provider [facility] , who verbally acknowledged these results. Electronically Signed   By: Macy Mis M.D.   On: 03/12/2021 10:27   MR BRAIN WO CONTRAST   Result Date: 03/12/2021 CLINICAL DATA:  Bilateral weakness EXAM:  MRI HEAD WITHOUT CONTRAST MRA HEAD WITHOUT CONTRAST TECHNIQUE: Multiplanar, multi-echo pulse sequences of the brain and surrounding structures were acquired without intravenous contrast. Angiographic images of the Circle of Willis were acquired using MRA technique without intravenous contrast. COMPARISON:  MRI brain 07/23/2018 FINDINGS: MRI HEAD Brain: There is restricted diffusion in the left PCA territory involving the parasagittal occipital lobe and medial left temporal lobe. Subtle early changes are likely present in retrospect on the prior CT. There is additional involvement of the left thalamus. Minimal involvement of the contralateral right thalamus is also noted. There is no evidence of intracranial hemorrhage. Prominence of the ventricles and sulci reflects generalized parenchymal volume loss. Additional patchy T2 hyperintensity in the supratentorial white matter is nonspecific but may reflect minor chronic microvascular ischemic changes. 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 There is no intracranial mass or significant mass effect. There is no hydrocephalus or extra-axial fluid collection. Vascular: Major vessel flow voids at the skull base are preserved. Skull and upper cervical spine: Normal marrow signal is preserved. Sinuses/Orbits: Paranasal sinuses are aerated. Orbits are unremarkable. Other: Sella is unremarkable.  Mastoid air cells are clear. MRA HEAD Intracranial internal carotid arteries are patent with atherosclerotic irregularity. Middle and anterior cerebral arteries are patent. Intracranial vertebral arteries are patent. Basilar artery is patent. Right posterior cerebral artery is patent. Left P1 PCA is patent. There is occlusion of the left PCA at the P1-P2 junction. No aneurysm. IMPRESSION: Acute large left PCA territory infarction. Left PCA occlusion at the P1-P2 junction. Small acute right thalamic infarct. No hemorrhage or mass effect. These  results were called by telephone at the time of interpretation on 03/12/2021 at 10:18 am to provider [facility] , who verbally acknowledged these results. Electronically Signed   By: Macy Mis M.D.   On: 03/12/2021 10:27   US Carotid Bilateral   Result Date: 03/12/2021 CLINICAL DATA:  Chest pain with shortness of breath EXAM: BILATERAL CAROTID DUPLEX ULTRASOUND TECHNIQUE: Pearline Cables scale imaging, color Doppler and duplex ultrasound were performed of bilateral carotid and vertebral arteries in the neck. COMPARISON:  None. FINDINGS: Criteria: Quantification of carotid stenosis is based on velocity parameters that correlate the residual internal carotid diameter with NASCET-based stenosis levels, using the diameter of the distal internal carotid lumen as the denominator for stenosis measurement. The following velocity measurements were obtained: RIGHT ICA: 122/32 cm/sec CCA: 57/32 cm/sec SYSTOLIC ICA/CCA RATIO:  2.2 ECA: 122 cm/sec LEFT ICA: 105/33 cm/sec CCA: 20/25 cm/sec SYSTOLIC ICA/CCA RATIO:  1.5 ECA: 157 cm/sec RIGHT CAROTID ARTERY: Hypoechoic intimal thickening and mixed plaque formation. Despite this, there is no hemodynamically significant right ICA stenosis, velocity elevation, or turbulent flow. Degree of narrowing less than 50% by ultrasound criteria. RIGHT VERTEBRAL ARTERY:  Normal antegrade flow LEFT CAROTID ARTERY: Similar intimal thickening and mixed echogenicity bifurcation plaque. No hemodynamically significant  left ICA stenosis, velocity elevation, or turbulent flow. Degree of narrowing also less than 50% by ultrasound criteria. LEFT VERTEBRAL ARTERY:  Normal antegrade flow IMPRESSION: Bilateral carotid atherosclerosis. No hemodynamically significant ICA stenosis. Degree of narrowing less than 50% bilaterally by ultrasound criteria. Patent antegrade vertebral flow bilaterally Electronically Signed   By: Jerilynn Mages.  Shick M.D.   On: 03/12/2021 16:13   US Carotid Bilateral (at [facility] and [facility] only)    Result Date: 03/12/2021 CLINICAL DATA:  New right-sided weakness EXAM: BILATERAL CAROTID DUPLEX ULTRASOUND TECHNIQUE: Pearline Cables scale imaging, color Doppler and duplex ultrasound were performed of bilateral carotid and vertebral arteries in the neck. COMPARISON:  None. FINDINGS: Criteria: Quantification of carotid stenosis is based on velocity parameters that correlate the residual internal carotid diameter with NASCET-based stenosis levels, using the diameter of the distal internal carotid lumen as the denominator for stenosis measurement. The following velocity measurements were obtained: RIGHT ICA: 122/36 cm/sec CCA: 16/10 cm/sec SYSTOLIC ICA/CCA RATIO:  84 ECA:  1.5 cm/sec LEFT ICA: 87/38 cm/sec CCA: 96/04 cm/sec SYSTOLIC ICA/CCA RATIO:  1.3 ECA:  83 cm/sec RIGHT CAROTID ARTERY: No significant atherosclerotic plaque or evidence of stenosis in the internal carotid artery. RIGHT VERTEBRAL ARTERY:  Patent with normal antegrade flow. LEFT CAROTID ARTERY: No significant atherosclerotic plaque or evidence of stenosis in the internal carotid artery. LEFT VERTEBRAL ARTERY:  Patent with normal antegrade flow. IMPRESSION: 1. No significant atherosclerotic plaque or evidence of stenosis in the internal carotid arteries. 2. Vertebral arteries are patent with normal antegrade flow. Electronically Signed   By: Jacqulynn Cadet M.D.   On: 03/12/2021 11:08   ECHOCARDIOGRAM COMPLETE BUBBLE STUDY   Result Date: 03/13/2021    ECHOCARDIOGRAM REPORT   Patient Name:   ANDRIUS ANDREPONT Date of Exam: 03/13/2021 Medical Rec #:  [id number]  Height:       70.0 in Accession #:    [id number] Weight:       220.5 lb Date of Birth:  02/13/1951  BSA:          2.176 m Patient Age:    43 years   BP:           151/82 mmHg Patient Gender: M          HR:           101 bpm. Exam Location:  [facility] Procedure: 2D Echo, Cardiac Doppler and Color Doppler Indications:    Stroke 434.91 / I63.9  History:        Patient has no prior history of Echocardiogram  examinations.                 COPD, Arrythmias:Atrial Fibrillation; Risk Factors:Hypertension                 and Dyslipidemia. Cancer of left lung (HCC)-S/P lobectomy of                 lung.  Sonographer:    Alvino Chapel RCS Referring Phys: [id number] Gerber  1. Left ventricular ejection fraction, by estimation, is 50 to 55%. The left ventricle has low normal function. Left ventricular endocardial border not optimally defined to evaluate regional wall motion. Left ventricular diastolic parameters are consistent with Grade I diastolic dysfunction (impaired relaxation).  2. Right ventricular systolic function is normal. The right ventricular size is normal.  3. The mitral valve is normal in structure. No evidence of mitral valve regurgitation. No evidence of mitral stenosis.  4. The aortic valve is  tricuspid. Aortic valve regurgitation is not visualized. No aortic stenosis is present.  5. The inferior vena cava is normal in size with greater than 50% respiratory variability, suggesting right atrial pressure of 3 mmHg.  6. Agitated saline contrast bubble study was negative, with no evidence of any interatrial shunt. FINDINGS  Left Ventricle: Left ventricular ejection fraction, by estimation, is 50 to 55%. The left ventricle has low normal function. Left ventricular endocardial border not optimally defined to evaluate regional wall motion. The left ventricular internal cavity  size was normal in size. There is no left ventricular hypertrophy. Left ventricular diastolic parameters are consistent with Grade I diastolic dysfunction (impaired relaxation). Normal left ventricular filling pressure. Right Ventricle: The right ventricular size is normal. No increase in right ventricular wall thickness. Right ventricular systolic function is normal. Left Atrium: Left atrial size was normal in size. Right Atrium: Right atrial size was normal in size. Pericardium: There is no evidence of pericardial  effusion. Mitral Valve: The mitral valve is normal in structure. No evidence of mitral valve regurgitation. No evidence of mitral valve stenosis. Tricuspid Valve: The tricuspid valve is normal in structure. Tricuspid valve regurgitation is not demonstrated. No evidence of tricuspid stenosis. Aortic Valve: The aortic valve is tricuspid. Aortic valve regurgitation is not visualized. No aortic stenosis is present. Aortic valve mean gradient measures 2.8 mmHg. Aortic valve peak gradient measures 5.5 mmHg. Aortic valve area, by VTI measures 3.42 cm. Pulmonic Valve: The pulmonic valve was not well visualized. Pulmonic valve regurgitation is not visualized. No evidence of pulmonic stenosis. Aorta: The aortic root is normal in size and structure. Pulmonary Artery: Indeterminant PASP, inadequate TR jet. Venous: The inferior vena cava is normal in size with greater than 50% respiratory variability, suggesting right atrial pressure of 3 mmHg. IAS/Shunts: No atrial level shunt detected by color flow Doppler. Agitated saline contrast was given intravenously to evaluate for intracardiac shunting. Agitated saline contrast bubble study was negative, with no evidence of any interatrial shunt.  LEFT VENTRICLE PLAX 2D LVIDd:         4.50 cm  Diastology LVIDs:         3.20 cm  LV e' medial:    5.55 cm/s LV PW:         1.00 cm  LV E/e' medial:  10.6 LV IVS:        1.10 cm  LV e' lateral:   5.00 cm/s LVOT diam:     2.10 cm  LV E/e' lateral: 11.7 LV SV:         63 LV SV Index:   29 LVOT Area:     3.46 cm  RIGHT VENTRICLE RV S prime:     10.30 cm/s TAPSE (M-mode): 1.9 cm LEFT ATRIUM             Index       RIGHT ATRIUM           Index LA diam:        2.80 cm 1.29 cm/m  RA Area:     13.50 cm LA Vol (A2C):   45.9 ml 21.10 ml/m RA Volume:   34.90 ml  16.04 ml/m LA Vol (A4C):   36.0 ml 16.55 ml/m LA Biplane Vol: 40.5 ml 18.62 ml/m  AORTIC VALVE AV Area (Vmax):    3.27 cm AV Area (Vmean):   3.08 cm AV Area (VTI):     3.42 cm AV Vmax:  117.75 cm/s AV Vmean:          77.410 cm/s AV VTI:            0.185 m AV Peak Grad:      5.5 mmHg AV Mean Grad:      2.8 mmHg LVOT Vmax:         111.00 cm/s LVOT Vmean:        68.800 cm/s LVOT VTI:          0.183 m LVOT/AV VTI ratio: 0.99  AORTA Ao Root diam: 3.90 cm MITRAL VALVE MV Area (PHT): 4.33 cm     SHUNTS MV Decel Time: 175 msec     Systemic VTI:  0.18 m MV E velocity: 58.70 cm/s   Systemic Diam: 2.10 cm MV A velocity: 109.00 cm/s MV E/A ratio:  0.54 Carlyle Dolly MD Electronically signed by Carlyle Dolly MD Signature Date/Time: 03/13/2021/4:38:25 PM    Final              Medical Problem List and Plan: 1.  Right side weakness with aphasia secondary to left PCA territory infarction as well as small acute right thalamic infarction.             -patient may  shower             -ELOS/Goals: 12-14d, Supervision ADL and Mobility  2.  Antithrombotics: -DVT/anticoagulation: Lovenox             -antiplatelet therapy: Aspirin 325 mg daily and transition to ELIQUIS 5 mg BID 03/19/2021 3. Pain Management: Fioricet as needed headache 4. Mood: Melatonin 6 mg nightly             -antipsychotic agents: N/A 5. Neuropsych: This patient is not capable of making decisions on his own behalf. 6. Skin/Wound Care: Routine skin checks 7. Fluids/Electrolytes/Nutrition: Routine in and outs with follow-up chemistries 8.  PAF.  Toprol-XL 50 mg daily, Cardizem 120 mg twice daily.  Cardiac rate controlled 9.  Hyperlipidemia.  Fenofibrate 160 mg daily/Pravachol 10.  History of lung cancer/COPD.  Status post VATS.  Follow-up outpatient.  Continue chronic oxygen as prior to admission 11.  Tobacco abuse.  Counseling 12.  New onset diabetes mellitus.  Hemoglobin A1c 7.5.  SSI.  Consider metformin. 13.  Obesity.  BMI 31.63.  Dietary follow-up   Cathlyn Parsons, PA-C 03/14/2021  "I have personally performed a face to face diagnostic evaluation of this patient.  Additionally, I have reviewed and concur  with the physician assistant's documentation above." Charlett Blake M.D. [facility] Fellow [employer] Diplomate Am Board of Electrodiagnostic Med Fellow Am Board of Interventional Pain

## 2021-03-14 NOTE — TOC Progression Note (Signed)
Transition of Care [facility]) - Progression Note    Patient Details  Name: Randall Sullivan MRN: [id number] Date of Birth: 10/12/1950  Transition of Care [facility] CM/SW Contact  Boneta Lucks, RN Phone Number: 03/14/2021, 10:46 AM  Clinical Narrative:   CIR accepted patient. Wife at the bedside accepting. CIR set up [facility], Medical necessity completed, RN to call report.   Expected Discharge Plan: [place] (CIR) Barriers to Discharge: Barriers Resolved  Expected Discharge Plan and Services Expected Discharge Plan: [place] (CIR)    Expected Discharge Date: 03/14/21                   Readmission Risk Interventions Readmission Risk Prevention Plan 03/14/2021 12/27/2018 12/27/2018  Post Dischage Appt Complete - -  Medication Screening Complete - -  Transportation Screening Complete - Complete  PCP or Specialist Appt within 5-7 Days - - Complete  Home Care Screening - Complete -  Medication Review (RN CM) - Complete -  Some recent data might be hidden

## 2021-03-14 NOTE — Progress Notes (Signed)
Called report to RN at [facility] telephone number [phone]

## 2021-03-14 NOTE — Progress Notes (Addendum)
Inpatient Rehabilitation Medication Review by a Pharmacist  A complete drug regimen review was completed for this patient to identify any potential clinically significant medication issues.  Clinically significant medication issues were identified:  yes   Type of Medication Issue Identified Description of Issue Urgent (address now) Non-Urgent (address on AM team rounds) Plan Plan Accepted by Provider? (Yes / No / Pending AM Rounds)  Drug Interaction(s) (clinically significant)       Duplicate Therapy       Allergy       No Medication Administration End Date       Incorrect Dose       Additional Drug Therapy Needed       Other  Enoxaparin 50mg  subq q24h ordered, crcl 62.3, may need to increase interval to q12h for full anticoagulation. Transitioning to apixaban 5mg  po BID on 6/20 Non urgent Follow up in AM rounds Cont lovenox prophylaxis until 6/19 then apixaban due to CVA    Name of provider notified for urgent issues identified:   Provider Method of Notification:    For non-urgent medication issues to be resolved on team rounds tomorrow morning a [facility] [place] was sent to:    Pharmacist comments:   Time spent performing this drug regimen review (minutes):  20   Garrett Coffee 03/14/2021 5:05 PM

## 2021-03-14 NOTE — Discharge Summary (Signed)
Physician Discharge Summary  Randall Sullivan IRJ:[id number] DOB: Nov 08, 1950 DOA: 03/12/2021  PCP: Doree Albee, MD  Admit date: 03/12/2021  Discharge date: 03/14/2021  Admitted From:Home  Disposition:  CIR  Recommendations for Outpatient Follow-up:  Follow up with PCP in 1-2 weeks Continue on full dose aspirin through 6/19 and then start on Eliquis as prescribed starting 6/20 Continue on increased dose of pravastatin at 40 mg daily Continue other home medications with the exception of chlorthalidone Consider starting on metformin for new onset diabetes with hemoglobin 7.5%, monitor closely.  Home Health: None  Equipment/Devices: None  Discharge Condition:Stable  CODE STATUS: Full  Diet recommendation: Heart Healthy/carb modified  Brief/Interim Summary: Randall Sullivan is a 70 y.o. male with medical history significant of paroxysmal atrial fibrillation, osteoarthritis, seasonal allergies, lung cancer, emphysema on home oxygen at 2 LPM, active smoker, hyperlipidemia, hypertension who was LKW  around 2000 on 03/11/2021 when he went to sleep and woke up around 0100 on 03/12/2021 with complaints of pressure-like nonradiating chest pain and shortness of breath.  He was noted to have right-sided weakness and was admitted for further assessment of CVA.  CT head with no acute findings initially, but brain MRI demonstrating large territory PCA infarct with occlusion of PCA.  He was started on full dose aspirin per neurology recommendations with recommendation to start on Eliquis for anticoagulation starting 6/20.  His home pravastatin dose has also been increased given his high LDL.  Additionally, he will need hypoglycemic agents moving forward to treat his new onset/finding of diabetes.  He was noted to have some periods of atrial fibrillation with RVR that quickly resolved with IV Cardizem administration and he has been started back on his home Cardizem and metoprolol for rate control and is in stable  condition for discharge today to CIR.  Discharge Diagnoses:  Principal Problem:   Right sided weakness Active Problems:   Essential (primary) hypertension   PAF (paroxysmal atrial fibrillation) (HCC)   Hyperlipidemia   Aortic atherosclerosis (HCC)   COPD with acute exacerbation (HCC)   Hypokalemia   Hyperglycemia   CVA (cerebral vascular accident) [facility])  Principal discharge diagnosis: Acute left PCA infarct.  Discharge Instructions  Discharge Instructions     Diet - low sodium heart healthy   Complete by: As directed    Increase activity slowly   Complete by: As directed       Allergies as of 03/14/2021       Reactions   No Known Allergies         Medication List     STOP taking these medications    chlorthalidone 25 MG tablet Commonly known as: HYGROTON       TAKE these medications    acetaminophen 500 MG tablet Commonly known as: TYLENOL Take 1,000 mg by mouth every 6 (six) hours as needed (for arthritis pain.).   albuterol (2.5 MG/3ML) 0.083% nebulizer solution Commonly known as: PROVENTIL Take 3 mLs (2.5 mg total) by nebulization every 6 (six) hours as needed for wheezing or shortness of breath.   apixaban 5 MG Tabs tablet Commonly known as: ELIQUIS Take 1 tablet (5 mg total) by mouth 2 (two) times daily. Start taking on: March 19, 2021   aspirin 325 MG EC tablet Take 1 tablet (325 mg total) by mouth daily for 4 days. Start taking on: March 15, 2021 What changed:  medication strength how much to take   diltiazem 120 MG 24 hr capsule Commonly known as: Dilt-XR Take 1 capsule (  120 mg total) by mouth 2 (two) times daily.   fenofibrate 145 MG tablet Commonly known as: TRICOR Take 1 tablet (145 mg total) by mouth daily.   metoprolol succinate 50 MG 24 hr tablet Commonly known as: TOPROL-XL Take 1 tablet (50 mg total) by mouth daily. Take with or immediately following a meal.   Milk of Magnesia 400 MG/5ML suspension Generic drug: magnesium  hydroxide Take 30 mLs by mouth daily as needed for mild constipation.   OXYGEN Inhale 2-3 L into the lungs continuous. Hx of lung ca, copd, and emphysema   pravastatin 40 MG tablet Commonly known as: PRAVACHOL Take 1 tablet (40 mg total) by mouth daily. What changed:  medication strength how much to take   senna-docusate 8.6-50 MG tablet Commonly known as: Senokot S Take 1 tablet by mouth 2 (two) times daily as needed for mild constipation or moderate constipation.   Vitamin D3 125 MCG (5000 UT) Caps Take 1 capsule by mouth daily.        Follow-up Information     Doree Albee, MD. Schedule an appointment as soon as possible for a visit.   Specialty: Internal Medicine Contact information: [place] [address] [phone]                Allergies  Allergen Reactions   No Known Allergies     Consultations: Neurology   Procedures/Studies: DG Chest 2 View  Result Date: 03/12/2021 CLINICAL DATA:  Chest pain, shortness of breath EXAM: CHEST - 2 VIEW COMPARISON:  04/06/2019 FINDINGS: Areas of scarring in the upper lobes. Hyperinflation/emphysema. Heart is normal size. No acute confluent opacities or effusions. IMPRESSION: COPD/chronic changes.  No active disease. Electronically Signed   By: Rolm Baptise M.D.   On: 03/12/2021 02:19   CT Head Wo Contrast  Result Date: 03/12/2021 CLINICAL DATA:  Cerebral hemorrhage suspected EXAM: CT HEAD WITHOUT CONTRAST TECHNIQUE: Contiguous axial images were obtained from the base of the skull through the vertex without intravenous contrast. COMPARISON:  03/07/2018 FINDINGS: Brain: Mild diffuse atrophy. No acute intracranial abnormality. Specifically, no hemorrhage, hydrocephalus, mass lesion, acute infarction, or significant intracranial injury. Vascular: No hyperdense vessel or unexpected calcification. Skull: No acute calvarial abnormality. Sinuses/Orbits: No acute findings Other: None IMPRESSION: No acute  intracranial abnormality. Electronically Signed   By: Rolm Baptise M.D.   On: 03/12/2021 02:48   MR ANGIO HEAD WO CONTRAST  Result Date: 03/12/2021 CLINICAL DATA:  Bilateral weakness EXAM: MRI HEAD WITHOUT CONTRAST MRA HEAD WITHOUT CONTRAST TECHNIQUE: Multiplanar, multi-echo pulse sequences of the brain and surrounding structures were acquired without intravenous contrast. Angiographic images of the Circle of Willis were acquired using MRA technique without intravenous contrast. COMPARISON:  MRI brain 07/23/2018 FINDINGS: MRI HEAD Brain: There is restricted diffusion in the left PCA territory involving the parasagittal occipital lobe and medial left temporal lobe. Subtle early changes are likely present in retrospect on the prior CT. There is additional involvement of the left thalamus. Minimal involvement of the contralateral right thalamus is also noted. There is no evidence of intracranial hemorrhage. Prominence of the ventricles and sulci reflects generalized parenchymal volume loss. Additional patchy T2 hyperintensity in the supratentorial white matter is nonspecific but may reflect minor chronic microvascular ischemic changes. 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 There is no intracranial mass or significant mass effect. There is no hydrocephalus or extra-axial fluid collection. Vascular: Major vessel flow voids at the skull base  are preserved. Skull and upper cervical spine: Normal marrow signal is preserved. Sinuses/Orbits: Paranasal sinuses are aerated. Orbits are unremarkable. Other: Sella is unremarkable.  Mastoid air cells are clear. MRA HEAD Intracranial internal carotid arteries are patent with atherosclerotic irregularity. Middle and anterior cerebral arteries are patent. Intracranial vertebral arteries are patent. Basilar artery is patent. Right posterior cerebral artery is patent. Left P1 PCA is patent. There is occlusion of the left PCA at the P1-P2  junction. No aneurysm. IMPRESSION: Acute large left PCA territory infarction. Left PCA occlusion at the P1-P2 junction. Small acute right thalamic infarct. No hemorrhage or mass effect. These results were called by telephone at the time of interpretation on 03/12/2021 at 10:18 am to provider [facility] , who verbally acknowledged these results. Electronically Signed   By: Macy Mis M.D.   On: 03/12/2021 10:27   MR BRAIN WO CONTRAST  Result Date: 03/12/2021 CLINICAL DATA:  Bilateral weakness EXAM: MRI HEAD WITHOUT CONTRAST MRA HEAD WITHOUT CONTRAST TECHNIQUE: Multiplanar, multi-echo pulse sequences of the brain and surrounding structures were acquired without intravenous contrast. Angiographic images of the Circle of Willis were acquired using MRA technique without intravenous contrast. COMPARISON:  MRI brain 07/23/2018 FINDINGS: MRI HEAD Brain: There is restricted diffusion in the left PCA territory involving the parasagittal occipital lobe and medial left temporal lobe. Subtle early changes are likely present in retrospect on the prior CT. There is additional involvement of the left thalamus. Minimal involvement of the contralateral right thalamus is also noted. There is no evidence of intracranial hemorrhage. Prominence of the ventricles and sulci reflects generalized parenchymal volume loss. Additional patchy T2 hyperintensity in the supratentorial white matter is nonspecific but may reflect minor chronic microvascular ischemic changes. 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 There is no intracranial mass or significant mass effect. There is no hydrocephalus or extra-axial fluid collection. Vascular: Major vessel flow voids at the skull base are preserved. Skull and upper cervical spine: Normal marrow signal is preserved. Sinuses/Orbits: Paranasal sinuses are aerated. Orbits are unremarkable. Other: Sella is unremarkable.  Mastoid air cells are clear. MRA HEAD  Intracranial internal carotid arteries are patent with atherosclerotic irregularity. Middle and anterior cerebral arteries are patent. Intracranial vertebral arteries are patent. Basilar artery is patent. Right posterior cerebral artery is patent. Left P1 PCA is patent. There is occlusion of the left PCA at the P1-P2 junction. No aneurysm. IMPRESSION: Acute large left PCA territory infarction. Left PCA occlusion at the P1-P2 junction. Small acute right thalamic infarct. No hemorrhage or mass effect. These results were called by telephone at the time of interpretation on 03/12/2021 at 10:18 am to provider [facility] , who verbally acknowledged these results. Electronically Signed   By: Macy Mis M.D.   On: 03/12/2021 10:27   US Carotid Bilateral  Result Date: 03/12/2021 CLINICAL DATA:  Chest pain with shortness of breath EXAM: BILATERAL CAROTID DUPLEX ULTRASOUND TECHNIQUE: Pearline Cables scale imaging, color Doppler and duplex ultrasound were performed of bilateral carotid and vertebral arteries in the neck. COMPARISON:  None. FINDINGS: Criteria: Quantification of carotid stenosis is based on velocity parameters that correlate the residual internal carotid diameter with NASCET-based stenosis levels, using the diameter of the distal internal carotid lumen as the denominator for stenosis measurement. The following velocity measurements were obtained: RIGHT ICA: 122/32 cm/sec CCA: 17/40 cm/sec SYSTOLIC ICA/CCA RATIO:  2.2 ECA: 122 cm/sec LEFT ICA: 105/33 cm/sec CCA: 81/44 cm/sec SYSTOLIC ICA/CCA RATIO:  1.5 ECA: 157  cm/sec RIGHT CAROTID ARTERY: Hypoechoic intimal thickening and mixed plaque formation. Despite this, there is no hemodynamically significant right ICA stenosis, velocity elevation, or turbulent flow. Degree of narrowing less than 50% by ultrasound criteria. RIGHT VERTEBRAL ARTERY:  Normal antegrade flow LEFT CAROTID ARTERY: Similar intimal thickening and mixed echogenicity bifurcation plaque. No  hemodynamically significant left ICA stenosis, velocity elevation, or turbulent flow. Degree of narrowing also less than 50% by ultrasound criteria. LEFT VERTEBRAL ARTERY:  Normal antegrade flow IMPRESSION: Bilateral carotid atherosclerosis. No hemodynamically significant ICA stenosis. Degree of narrowing less than 50% bilaterally by ultrasound criteria. Patent antegrade vertebral flow bilaterally Electronically Signed   By: Jerilynn Mages.  Shick M.D.   On: 03/12/2021 16:13   US Carotid Bilateral (at [facility] and [facility] only)  Result Date: 03/12/2021 CLINICAL DATA:  New right-sided weakness EXAM: BILATERAL CAROTID DUPLEX ULTRASOUND TECHNIQUE: Pearline Cables scale imaging, color Doppler and duplex ultrasound were performed of bilateral carotid and vertebral arteries in the neck. COMPARISON:  None. FINDINGS: Criteria: Quantification of carotid stenosis is based on velocity parameters that correlate the residual internal carotid diameter with NASCET-based stenosis levels, using the diameter of the distal internal carotid lumen as the denominator for stenosis measurement. The following velocity measurements were obtained: RIGHT ICA: 122/36 cm/sec CCA: 65/03 cm/sec SYSTOLIC ICA/CCA RATIO:  84 ECA:  1.5 cm/sec LEFT ICA: 87/38 cm/sec CCA: 54/65 cm/sec SYSTOLIC ICA/CCA RATIO:  1.3 ECA:  83 cm/sec RIGHT CAROTID ARTERY: No significant atherosclerotic plaque or evidence of stenosis in the internal carotid artery. RIGHT VERTEBRAL ARTERY:  Patent with normal antegrade flow. LEFT CAROTID ARTERY: No significant atherosclerotic plaque or evidence of stenosis in the internal carotid artery. LEFT VERTEBRAL ARTERY:  Patent with normal antegrade flow. IMPRESSION: 1. No significant atherosclerotic plaque or evidence of stenosis in the internal carotid arteries. 2. Vertebral arteries are patent with normal antegrade flow. Electronically Signed   By: Jacqulynn Cadet M.D.   On: 03/12/2021 11:08   ECHOCARDIOGRAM COMPLETE BUBBLE STUDY  Result Date: 03/13/2021     ECHOCARDIOGRAM REPORT   Patient Name:   Randall Sullivan Date of Exam: 03/13/2021 Medical Rec #:  [id number]  Height:       70.0 in Accession #:    [id number] Weight:       220.5 lb Date of Birth:  08/04/1951  BSA:          2.176 m Patient Age:    33 years   BP:           151/82 mmHg Patient Gender: M          HR:           101 bpm. Exam Location:  [facility] Procedure: 2D Echo, Cardiac Doppler and Color Doppler Indications:    Stroke 434.91 / I63.9  History:        Patient has no prior history of Echocardiogram examinations.                 COPD, Arrythmias:Atrial Fibrillation; Risk Factors:Hypertension                 and Dyslipidemia. Cancer of left lung (HCC)-S/P lobectomy of                 lung.  Sonographer:    Alvino Chapel RCS Referring Phys: [id number] [place]  1. Left ventricular ejection fraction, by estimation, is 50 to 55%. The left ventricle has low normal function. Left ventricular endocardial border not optimally defined to evaluate regional wall  motion. Left ventricular diastolic parameters are consistent with Grade I diastolic dysfunction (impaired relaxation).  2. Right ventricular systolic function is normal. The right ventricular size is normal.  3. The mitral valve is normal in structure. No evidence of mitral valve regurgitation. No evidence of mitral stenosis.  4. The aortic valve is tricuspid. Aortic valve regurgitation is not visualized. No aortic stenosis is present.  5. The inferior vena cava is normal in size with greater than 50% respiratory variability, suggesting right atrial pressure of 3 mmHg.  6. Agitated saline contrast bubble study was negative, with no evidence of any interatrial shunt. FINDINGS  Left Ventricle: Left ventricular ejection fraction, by estimation, is 50 to 55%. The left ventricle has low normal function. Left ventricular endocardial border not optimally defined to evaluate regional wall motion. The left ventricular internal cavity  size was normal in  size. There is no left ventricular hypertrophy. Left ventricular diastolic parameters are consistent with Grade I diastolic dysfunction (impaired relaxation). Normal left ventricular filling pressure. Right Ventricle: The right ventricular size is normal. No increase in right ventricular wall thickness. Right ventricular systolic function is normal. Left Atrium: Left atrial size was normal in size. Right Atrium: Right atrial size was normal in size. Pericardium: There is no evidence of pericardial effusion. Mitral Valve: The mitral valve is normal in structure. No evidence of mitral valve regurgitation. No evidence of mitral valve stenosis. Tricuspid Valve: The tricuspid valve is normal in structure. Tricuspid valve regurgitation is not demonstrated. No evidence of tricuspid stenosis. Aortic Valve: The aortic valve is tricuspid. Aortic valve regurgitation is not visualized. No aortic stenosis is present. Aortic valve mean gradient measures 2.8 mmHg. Aortic valve peak gradient measures 5.5 mmHg. Aortic valve area, by VTI measures 3.42 cm. Pulmonic Valve: The pulmonic valve was not well visualized. Pulmonic valve regurgitation is not visualized. No evidence of pulmonic stenosis. Aorta: The aortic root is normal in size and structure. Pulmonary Artery: Indeterminant PASP, inadequate TR jet. Venous: The inferior vena cava is normal in size with greater than 50% respiratory variability, suggesting right atrial pressure of 3 mmHg. IAS/Shunts: No atrial level shunt detected by color flow Doppler. Agitated saline contrast was given intravenously to evaluate for intracardiac shunting. Agitated saline contrast bubble study was negative, with no evidence of any interatrial shunt.  LEFT VENTRICLE PLAX 2D LVIDd:         4.50 cm  Diastology LVIDs:         3.20 cm  LV e' medial:    5.55 cm/s LV PW:         1.00 cm  LV E/e' medial:  10.6 LV IVS:        1.10 cm  LV e' lateral:   5.00 cm/s LVOT diam:     2.10 cm  LV E/e' lateral:  11.7 LV SV:         63 LV SV Index:   29 LVOT Area:     3.46 cm  RIGHT VENTRICLE RV S prime:     10.30 cm/s TAPSE (M-mode): 1.9 cm LEFT ATRIUM             Index       RIGHT ATRIUM           Index LA diam:        2.80 cm 1.29 cm/m  RA Area:     13.50 cm LA Vol (A2C):   45.9 ml 21.10 ml/m RA Volume:   34.90 ml  16.04 ml/m  LA Vol (A4C):   36.0 ml 16.55 ml/m LA Biplane Vol: 40.5 ml 18.62 ml/m  AORTIC VALVE AV Area (Vmax):    3.27 cm AV Area (Vmean):   3.08 cm AV Area (VTI):     3.42 cm AV Vmax:           117.75 cm/s AV Vmean:          77.410 cm/s AV VTI:            0.185 m AV Peak Grad:      5.5 mmHg AV Mean Grad:      2.8 mmHg LVOT Vmax:         111.00 cm/s LVOT Vmean:        68.800 cm/s LVOT VTI:          0.183 m LVOT/AV VTI ratio: 0.99  AORTA Ao Root diam: 3.90 cm MITRAL VALVE MV Area (PHT): 4.33 cm     SHUNTS MV Decel Time: 175 msec     Systemic VTI:  0.18 m MV E velocity: 58.70 cm/s   Systemic Diam: 2.10 cm MV A velocity: 109.00 cm/s MV E/A ratio:  0.54 Carlyle Dolly MD Electronically signed by Carlyle Dolly MD Signature Date/Time: 03/13/2021/4:38:25 PM    Final      Discharge Exam: Vitals:   03/14/21 0800 03/14/21 1000  BP: (!) 157/97 125/75  Pulse: 91 77  Resp: 19   Temp: 98.4 F (36.9 C)   SpO2: 96% 98%   Vitals:   03/14/21 0700 03/14/21 0738 03/14/21 0800 03/14/21 1000  BP: 126/74  (!) 157/97 125/75  Pulse: (!) 28  91 77  Resp: 17  19   Temp:   98.4 F (36.9 C)   TempSrc:   Oral   SpO2: (!) 75% 97% 96% 98%  Weight:      Height:        General: Pt is alert, awake, not in acute distress Cardiovascular: RRR, S1/S2 +, no rubs, no gallops Respiratory: CTA bilaterally, no wheezing, no rhonchi Abdominal: Soft, NT, ND, bowel sounds + Extremities: no edema, no cyanosis    The results of significant diagnostics from this hospitalization (including imaging, microbiology, ancillary and laboratory) are listed below for reference.     Microbiology: Recent Results (from the  past 240 hour(s))  Resp Panel by RT-PCR (Flu A&B, Covid) Nasopharyngeal Swab     Status: None   Collection Time: 03/12/21  2:15 AM   Specimen: Nasopharyngeal Swab; Nasopharyngeal(NP) swabs in vial transport medium  Result Value Ref Range Status   SARS Coronavirus 2 by RT PCR NEGATIVE NEGATIVE Final    Comment: (NOTE) SARS-CoV-2 target nucleic acids are NOT DETECTED.  The SARS-CoV-2 RNA is generally detectable in upper respiratory specimens during the acute phase of infection. The lowest concentration of SARS-CoV-2 viral copies this assay can detect is 138 copies/mL. A negative result does not preclude SARS-Cov-2 infection and should not be used as the sole basis for treatment or other patient management decisions. A negative result may occur with  improper specimen collection/handling, submission of specimen other than nasopharyngeal swab, presence of viral mutation(s) within the areas targeted by this assay, and inadequate number of viral copies(<138 copies/mL). A negative result must be combined with clinical observations, patient history, and epidemiological information. The expected result is Negative.  Fact Sheet for Patients:  EntrepreneurPulse.com.au  Fact Sheet for Healthcare Providers:  IncredibleEmployment.be  This test is no t yet approved or cleared by the [place] and  has been authorized for detection and/or diagnosis of SARS-CoV-2 by FDA under an Emergency Use Authorization (EUA). This EUA will remain  in effect (meaning this test can be used) for the duration of the COVID-19 declaration under Section 564(b)(1) of the Act, 21 U.S.C.section 360bbb-3(b)(1), unless the authorization is terminated  or revoked sooner.       Influenza A by PCR NEGATIVE NEGATIVE Final   Influenza B by PCR NEGATIVE NEGATIVE Final    Comment: (NOTE) The Xpert Xpress SARS-CoV-2/FLU/RSV plus assay is intended as an aid in the diagnosis of  influenza from Nasopharyngeal swab specimens and should not be used as a sole basis for treatment. Nasal washings and aspirates are unacceptable for Xpert Xpress SARS-CoV-2/FLU/RSV testing.  Fact Sheet for Patients: EntrepreneurPulse.com.au  Fact Sheet for Healthcare Providers: IncredibleEmployment.be  This test is not yet approved or cleared by the [place] FDA and has been authorized for detection and/or diagnosis of SARS-CoV-2 by FDA under an Emergency Use Authorization (EUA). This EUA will remain in effect (meaning this test can be used) for the duration of the COVID-19 declaration under Section 564(b)(1) of the Act, 21 U.S.C. section 360bbb-3(b)(1), unless the authorization is terminated or revoked.  Performed at [facility], [address], [place], [place] [address]   MRSA PCR Screening     Status: None   Collection Time: 03/12/21  6:14 AM   Specimen: Nasal Mucosa; Nasopharyngeal  Result Value Ref Range Status   MRSA by PCR NEGATIVE NEGATIVE Final    Comment:        The GeneXpert MRSA Assay (FDA approved for NASAL specimens only), is one component of a comprehensive MRSA colonization surveillance program. It is not intended to diagnose MRSA infection nor to guide or monitor treatment for MRSA infections. Performed at [facility], [address], [place], [place] [address]      Labs: BNP (last 3 results) Recent Labs    03/12/21 0215  BNP 30.8   Basic Metabolic Panel: Recent Labs  Lab 03/12/21 0215 03/13/21 0409 03/14/21 0859  NA 134* 135 134*  K 3.4* 3.6 3.6  CL 98 97* 95*  CO2 29 29 29   GLUCOSE 170* 129* 231*  BUN 19 19 24*  CREATININE 1.04 1.09 1.25*  CALCIUM 9.2 9.6 9.2  MG  --  1.8 1.7   Liver Function Tests: Recent Labs  Lab 03/12/21 0215  AST 27  ALT 35  ALKPHOS 46  BILITOT 0.4  PROT 7.1  ALBUMIN 4.1   No results for input(s): LIPASE, AMYLASE in the last 168 hours. No results for input(s):  AMMONIA in the last 168 hours. CBC: Recent Labs  Lab 03/12/21 0215 03/13/21 0409 03/14/21 0859  WBC 6.5 10.6* 9.7  NEUTROABS 3.9  --   --   HGB 15.3 14.9 14.9  HCT 46.0 46.8 46.9  MCV 86.6 87.0 87.8  PLT 239 216 224   Cardiac Enzymes: No results for input(s): CKTOTAL, CKMB, CKMBINDEX, TROPONINI in the last 168 hours. BNP: Invalid input(s): POCBNP CBG: Recent Labs  Lab 03/13/21 0713 03/13/21 1119 03/13/21 1625 03/13/21 2252 03/14/21 0742  GLUCAP 178* 135* 118* 148* 144*   D-Dimer No results for input(s): DDIMER in the last 72 hours. Hgb A1c Recent Labs    03/12/21 1725  HGBA1C 7.5*   Lipid Profile Recent Labs    03/12/21 1725  CHOL 242*  HDL 29*  LDLCALC UNABLE TO CALCULATE IF TRIGLYCERIDE OVER 400 mg/dL  TRIG 782*  CHOLHDL 8.3  LDLDIRECT 75.9  Thyroid function studies No results for input(s): TSH, T4TOTAL, T3FREE, THYROIDAB in the last 72 hours.  Invalid input(s): FREET3 Anemia work up No results for input(s): VITAMINB12, FOLATE, FERRITIN, TIBC, IRON, RETICCTPCT in the last 72 hours. Urinalysis    Component Value Date/Time   COLORURINE YELLOW 03/12/2021 0620   APPEARANCEUR CLEAR 03/12/2021 0620   LABSPEC 1.019 03/12/2021 0620   PHURINE 5.0 03/12/2021 0620   GLUCOSEU 50 (A) 03/12/2021 0620   HGBUR NEGATIVE 03/12/2021 0620   BILIRUBINUR NEGATIVE 03/12/2021 0620   KETONESUR NEGATIVE 03/12/2021 0620   PROTEINUR NEGATIVE 03/12/2021 0620   UROBILINOGEN 0.2 10/04/2011 1026   NITRITE NEGATIVE 03/12/2021 0620   LEUKOCYTESUR NEGATIVE 03/12/2021 0620   Sepsis Labs Invalid input(s): PROCALCITONIN,  WBC,  LACTICIDVEN Microbiology Recent Results (from the past 240 hour(s))  Resp Panel by RT-PCR (Flu A&B, Covid) Nasopharyngeal Swab     Status: None   Collection Time: 03/12/21  2:15 AM   Specimen: Nasopharyngeal Swab; Nasopharyngeal(NP) swabs in vial transport medium  Result Value Ref Range Status   SARS Coronavirus 2 by RT PCR NEGATIVE NEGATIVE Final     Comment: (NOTE) SARS-CoV-2 target nucleic acids are NOT DETECTED.  The SARS-CoV-2 RNA is generally detectable in upper respiratory specimens during the acute phase of infection. The lowest concentration of SARS-CoV-2 viral copies this assay can detect is 138 copies/mL. A negative result does not preclude SARS-Cov-2 infection and should not be used as the sole basis for treatment or other patient management decisions. A negative result may occur with  improper specimen collection/handling, submission of specimen other than nasopharyngeal swab, presence of viral mutation(s) within the areas targeted by this assay, and inadequate number of viral copies(<138 copies/mL). A negative result must be combined with clinical observations, patient history, and epidemiological information. The expected result is Negative.  Fact Sheet for Patients:  EntrepreneurPulse.com.au  Fact Sheet for Healthcare Providers:  IncredibleEmployment.be  This test is no t yet approved or cleared by the [place] FDA and  has been authorized for detection and/or diagnosis of SARS-CoV-2 by FDA under an Emergency Use Authorization (EUA). This EUA will remain  in effect (meaning this test can be used) for the duration of the COVID-19 declaration under Section 564(b)(1) of the Act, 21 U.S.C.section 360bbb-3(b)(1), unless the authorization is terminated  or revoked sooner.       Influenza A by PCR NEGATIVE NEGATIVE Final   Influenza B by PCR NEGATIVE NEGATIVE Final    Comment: (NOTE) The Xpert Xpress SARS-CoV-2/FLU/RSV plus assay is intended as an aid in the diagnosis of influenza from Nasopharyngeal swab specimens and should not be used as a sole basis for treatment. Nasal washings and aspirates are unacceptable for Xpert Xpress SARS-CoV-2/FLU/RSV testing.  Fact Sheet for Patients: EntrepreneurPulse.com.au  Fact Sheet for Healthcare  Providers: IncredibleEmployment.be  This test is not yet approved or cleared by the [place] FDA and has been authorized for detection and/or diagnosis of SARS-CoV-2 by FDA under an Emergency Use Authorization (EUA). This EUA will remain in effect (meaning this test can be used) for the duration of the COVID-19 declaration under Section 564(b)(1) of the Act, 21 U.S.C. section 360bbb-3(b)(1), unless the authorization is terminated or revoked.  Performed at [facility], [address] [place], [place] [address]   MRSA PCR Screening     Status: None   Collection Time: 03/12/21  6:14 AM   Specimen: Nasal Mucosa; Nasopharyngeal  Result Value Ref Range Status   MRSA by PCR NEGATIVE NEGATIVE Final  Comment:        The GeneXpert MRSA Assay (FDA approved for NASAL specimens only), is one component of a comprehensive MRSA colonization surveillance program. It is not intended to diagnose MRSA infection nor to guide or monitor treatment for MRSA infections. Performed at [facility], [address], [place], [place] [address]      Time coordinating discharge: 35 minutes  SIGNED:   Rodena Goldmann, DO [facility] 03/14/2021, 10:11 AM  If 7PM-7AM, please contact night-coverage [email or web address]

## 2021-03-14 NOTE — Progress Notes (Signed)
Inpatient Rehab Admissions Coordinator:  ? ?I have a bed for this Pt. On CIR today. RN may call report to [phone]. ? ?Deveney Bayon, MS, CCC-SLP ?Rehab Admissions Coordinator  ?[phone] (celll) ?[phone] (office) ?

## 2021-03-15 DIAGNOSIS — E1169 Type 2 diabetes mellitus with other specified complication: Secondary | ICD-10-CM

## 2021-03-15 DIAGNOSIS — E669 Obesity, unspecified: Secondary | ICD-10-CM

## 2021-03-15 DIAGNOSIS — H53461 Homonymous bilateral field defects, right side: Secondary | ICD-10-CM

## 2021-03-15 DIAGNOSIS — I480 Paroxysmal atrial fibrillation: Secondary | ICD-10-CM

## 2021-03-15 LAB — CBC WITH DIFFERENTIAL/PLATELET
Abs Immature Granulocytes: 0.04 10*3/uL (ref 0.00–0.07)
Basophils Absolute: 0 10*3/uL (ref 0.0–0.1)
Basophils Relative: 1 %
Eosinophils Absolute: 0.1 10*3/uL (ref 0.0–0.5)
Eosinophils Relative: 1 %
HCT: 44.6 % (ref 39.0–52.0)
Hemoglobin: 14.5 g/dL (ref 13.0–17.0)
Immature Granulocytes: 1 %
Lymphocytes Relative: 21 %
Lymphs Abs: 1.8 10*3/uL (ref 0.7–4.0)
MCH: 27.8 pg (ref 26.0–34.0)
MCHC: 32.5 g/dL (ref 30.0–36.0)
MCV: 85.4 fL (ref 80.0–100.0)
Monocytes Absolute: 1 10*3/uL (ref 0.1–1.0)
Monocytes Relative: 12 %
Neutro Abs: 5.8 10*3/uL (ref 1.7–7.7)
Neutrophils Relative %: 64 %
Platelets: 233 10*3/uL (ref 150–400)
RBC: 5.22 MIL/uL (ref 4.22–5.81)
RDW: 13.9 % (ref 11.5–15.5)
WBC: 8.8 10*3/uL (ref 4.0–10.5)
nRBC: 0 % (ref 0.0–0.2)

## 2021-03-15 LAB — GLUCOSE, CAPILLARY
Glucose-Capillary: 155 mg/dL — ABNORMAL HIGH (ref 70–99)
Glucose-Capillary: 127 mg/dL — ABNORMAL HIGH (ref 70–99)
Glucose-Capillary: 132 mg/dL — ABNORMAL HIGH (ref 70–99)
Glucose-Capillary: 77 mg/dL (ref 70–99)

## 2021-03-15 LAB — COMPREHENSIVE METABOLIC PANEL
ALT: 26 U/L (ref 0–44)
AST: 31 U/L (ref 15–41)
Albumin: 3.7 g/dL (ref 3.5–5.0)
Alkaline Phosphatase: 40 U/L (ref 38–126)
Anion gap: 10 (ref 5–15)
BUN: 23 mg/dL (ref 8–23)
CO2: 31 mmol/L (ref 22–32)
Calcium: 9.1 mg/dL (ref 8.9–10.3)
Chloride: 96 mmol/L — ABNORMAL LOW (ref 98–111)
Creatinine, Ser: 1.45 mg/dL — ABNORMAL HIGH (ref 0.61–1.24)
GFR, Estimated: 52 mL/min — ABNORMAL LOW (ref >60)
Glucose, Bld: 129 mg/dL — ABNORMAL HIGH (ref 70–99)
Potassium: 3.6 mmol/L (ref 3.5–5.1)
Sodium: 137 mmol/L (ref 135–145)
Total Bilirubin: 1.3 mg/dL — ABNORMAL HIGH (ref 0.3–1.2)
Total Protein: 7 g/dL (ref 6.5–8.1)

## 2021-03-15 MED ORDER — TRAZODONE HCL 50 MG PO TABS
50.0000 mg | ORAL_TABLET | Freq: Every day | ORAL | Status: DC
  Administered 2021-03-15: 50 mg via ORAL
  Filled 2021-03-15: qty 1

## 2021-03-15 MED ORDER — GLIMEPIRIDE 2 MG PO TABS
1.0000 mg | ORAL_TABLET | Freq: Every day | ORAL | Status: DC
  Administered 2021-03-15 – 2021-03-23 (×9): 1 mg via ORAL
  Filled 2021-03-15 (×9): qty 1

## 2021-03-15 NOTE — Progress Notes (Signed)
PROGRESS NOTE   Subjective/Complaints: Was restless last night. Didn't sleep all that well. Would like something scheduled to help him rest. No problems with breathing  ROS: Patient denies fever, rash, sore throat, blurred vision, nausea, vomiting, diarrhea, cough, shortness of breath or chest pain, joint or back pain, headache, or mood change.    Objective:   ECHOCARDIOGRAM COMPLETE BUBBLE STUDY  Result Date: 03/13/2021    ECHOCARDIOGRAM REPORT   Patient Name:   Randall Sullivan Date of Exam: 03/13/2021 Medical Rec #:  [id number]  Height:       70.0 in Accession #:    [id number] Weight:       220.5 lb Date of Birth:  1950-12-24  BSA:          2.176 m Patient Age:    70 years   BP:           151/82 mmHg Patient Gender: M          HR:           101 bpm. Exam Location:  [facility] Procedure: 2D Echo, Cardiac Doppler and Color Doppler Indications:    Stroke 434.91 / I63.9  History:        Patient has no prior history of Echocardiogram examinations.                 COPD, Arrythmias:Atrial Fibrillation; Risk Factors:Hypertension                 and Dyslipidemia. Cancer of left lung (HCC)-S/P lobectomy of                 lung.  Sonographer:    Alvino Chapel RCS Referring Phys: [id number] [place]  1. Left ventricular ejection fraction, by estimation, is 50 to 55%. The left ventricle has low normal function. Left ventricular endocardial border not optimally defined to evaluate regional wall motion. Left ventricular diastolic parameters are consistent with Grade I diastolic dysfunction (impaired relaxation).  2. Right ventricular systolic function is normal. The right ventricular size is normal.  3. The mitral valve is normal in structure. No evidence of mitral valve regurgitation. No evidence of mitral stenosis.  4. The aortic valve is tricuspid. Aortic valve regurgitation is not visualized. No aortic stenosis is present.  5. The inferior vena  cava is normal in size with greater than 50% respiratory variability, suggesting right atrial pressure of 3 mmHg.  6. Agitated saline contrast bubble study was negative, with no evidence of any interatrial shunt. FINDINGS  Left Ventricle: Left ventricular ejection fraction, by estimation, is 50 to 55%. The left ventricle has low normal function. Left ventricular endocardial border not optimally defined to evaluate regional wall motion. The left ventricular internal cavity  size was normal in size. There is no left ventricular hypertrophy. Left ventricular diastolic parameters are consistent with Grade I diastolic dysfunction (impaired relaxation). Normal left ventricular filling pressure. Right Ventricle: The right ventricular size is normal. No increase in right ventricular wall thickness. Right ventricular systolic function is normal. Left Atrium: Left atrial size was normal in size. Right Atrium: Right atrial size was normal in size. Pericardium: There is no evidence of  pericardial effusion. Mitral Valve: The mitral valve is normal in structure. No evidence of mitral valve regurgitation. No evidence of mitral valve stenosis. Tricuspid Valve: The tricuspid valve is normal in structure. Tricuspid valve regurgitation is not demonstrated. No evidence of tricuspid stenosis. Aortic Valve: The aortic valve is tricuspid. Aortic valve regurgitation is not visualized. No aortic stenosis is present. Aortic valve mean gradient measures 2.8 mmHg. Aortic valve peak gradient measures 5.5 mmHg. Aortic valve area, by VTI measures 3.42 cm. Pulmonic Valve: The pulmonic valve was not well visualized. Pulmonic valve regurgitation is not visualized. No evidence of pulmonic stenosis. Aorta: The aortic root is normal in size and structure. Pulmonary Artery: Indeterminant PASP, inadequate TR jet. Venous: The inferior vena cava is normal in size with greater than 50% respiratory variability, suggesting right atrial pressure of 3 mmHg.  IAS/Shunts: No atrial level shunt detected by color flow Doppler. Agitated saline contrast was given intravenously to evaluate for intracardiac shunting. Agitated saline contrast bubble study was negative, with no evidence of any interatrial shunt.  LEFT VENTRICLE PLAX 2D LVIDd:         4.50 cm  Diastology LVIDs:         3.20 cm  LV e' medial:    5.55 cm/s LV PW:         1.00 cm  LV E/e' medial:  10.6 LV IVS:        1.10 cm  LV e' lateral:   5.00 cm/s LVOT diam:     2.10 cm  LV E/e' lateral: 11.7 LV SV:         63 LV SV Index:   29 LVOT Area:     3.46 cm  RIGHT VENTRICLE RV S prime:     10.30 cm/s TAPSE (M-mode): 1.9 cm LEFT ATRIUM             Index       RIGHT ATRIUM           Index LA diam:        2.80 cm 1.29 cm/m  RA Area:     13.50 cm LA Vol (A2C):   45.9 ml 21.10 ml/m RA Volume:   34.90 ml  16.04 ml/m LA Vol (A4C):   36.0 ml 16.55 ml/m LA Biplane Vol: 40.5 ml 18.62 ml/m  AORTIC VALVE AV Area (Vmax):    3.27 cm AV Area (Vmean):   3.08 cm AV Area (VTI):     3.42 cm AV Vmax:           117.75 cm/s AV Vmean:          77.410 cm/s AV VTI:            0.185 m AV Peak Grad:      5.5 mmHg AV Mean Grad:      2.8 mmHg LVOT Vmax:         111.00 cm/s LVOT Vmean:        68.800 cm/s LVOT VTI:          0.183 m LVOT/AV VTI ratio: 0.99  AORTA Ao Root diam: 3.90 cm MITRAL VALVE MV Area (PHT): 4.33 cm     SHUNTS MV Decel Time: 175 msec     Systemic VTI:  0.18 m MV E velocity: 58.70 cm/s   Systemic Diam: 2.10 cm MV A velocity: 109.00 cm/s MV E/A ratio:  0.54 Carlyle Dolly MD Electronically signed by Carlyle Dolly MD Signature Date/Time: 03/13/2021/4:38:25 PM    Final  Recent Labs    03/14/21 1400 03/15/21 0500  WBC 9.7 8.8  HGB 15.8 14.5  HCT 47.8 44.6  PLT 256 233   Recent Labs    03/14/21 0859 03/14/21 1400 03/15/21 0500  NA 134*  --  137  K 3.6  --  3.6  CL 95*  --  96*  CO2 29  --  31  GLUCOSE 231*  --  129*  BUN 24*  --  23  CREATININE 1.25* 1.29* 1.45*  CALCIUM 9.2  --  9.1     Intake/Output Summary (Last 24 hours) at 03/15/2021 1134 Last data filed at 03/15/2021 0840 Gross per 24 hour  Intake 240 ml  Output 600 ml  Net -360 ml        Physical Exam: Vital Signs Blood pressure 126/80, pulse 98, temperature 97.8 F (36.6 C), resp. rate 20, height 5\' 10"  (1.778 m), SpO2 98 %.  General: Alert and oriented x 3, No apparent distress HEENT: Head is normocephalic, atraumatic, PERRLA, EOMI, sclera anicteric, oral mucosa pink and moist, dentition intact, ext ear canals clear,  Neck: Supple without JVD or lymphadenopathy Heart: Reg rate and rhythm. No murmurs rubs or gallops Chest: CTA bilaterally without wheezes, rales, or rhonchi; no distress Abdomen: Soft, non-tender, non-distended, bowel sounds positive. Extremities: No clubbing, cyanosis, or edema. Pulses are 2+ Psych: Pt's affect is appropriate. Pt is cooperative Skin: Clean and intact without signs of breakdown Neuro: reasonable insight and awareness. Right HH, delays in naming objects. Able to convey thoughts, ideas however. RUE 4/5, RLE 4- to 4/5. LUE and LLE 4+ to 5/5. No sensory findings.   Musculoskeletal: Full ROM, No pain with AROM or PROM in the neck, trunk, or extremities. Posture appropriate    Assessment/Plan: 1. Functional deficits which require 3+ hours per day of interdisciplinary therapy in a comprehensive inpatient rehab setting. Physiatrist is providing close team supervision and 24 hour management of active medical problems listed below. Physiatrist and rehab team continue to assess barriers to discharge/monitor patient progress toward functional and medical goals  Care Tool:  Bathing    Body parts bathed by patient: Right arm, Left arm, Abdomen, Chest, Front perineal area, Buttocks, Right upper leg, Left upper leg, Left lower leg, Right lower leg, Face         Bathing assist Assist Level: Contact Guard/Touching assist     Upper Body Dressing/Undressing Upper body dressing    What is the patient wearing?: Pull over shirt    Upper body assist Assist Level: Supervision/Verbal cueing    Lower Body Dressing/Undressing Lower body dressing      What is the patient wearing?: Pants     Lower body assist Assist for lower body dressing: Moderate Assistance - Patient 50 - 74%     Toileting Toileting    Toileting assist Assist for toileting: Contact Guard/Touching assist     Transfers Chair/bed transfer  Transfers assist           Locomotion Ambulation   Ambulation assist              Walk 10 feet activity   Assist           Walk 50 feet activity   Assist           Walk 150 feet activity   Assist           Walk 10 feet on uneven surface  activity   Assist  Wheelchair     Assist               Wheelchair 50 feet with 2 turns activity    Assist            Wheelchair 150 feet activity     Assist          Blood pressure 126/80, pulse 98, temperature 97.8 F (36.6 C), resp. rate 20, height 5\' 10"  (1.778 m), SpO2 98 %.  Medical Problem List and Plan: 1.  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             -patient may  shower             -ELOS/Goals: 12-14d, Supervision ADL and Mobility   -Patient is beginning CIR therapies today including PT, OT, and SLP  2.  Antithrombotics: -DVT/anticoagulation: Lovenox             -antiplatelet therapy: Aspirin 325 mg daily and transition to ELIQUIS 5 mg BID 03/19/2021 3. Pain Management: Fioricet as needed headache. controlled 4. Mood/sleep: Melatonin 6 mg nightly  6/16 add trazodone at 9pm scheduled             -antipsychotic agents: N/A 5. Neuropsych: This patient is not capable of making decisions on his own behalf. 6. Skin/Wound Care: Routine skin checks 7. Fluids/Electrolytes/Nutrition: encouarge po  -I personally  reviewed the patient's labs today. -BUN/Cr remain elevated--encourage fluids   -recheck labs Monday 8.  PAF.  Toprol-XL 50 mg daily, Cardizem 120 mg twice daily.  Cardiac rate remains controlled 9.  Hyperlipidemia.  Fenofibrate 160 mg daily/Pravachol 10.  History of lung cancer/COPD.  Status post VATS.  Follow-up outpatient.  Continue chronic oxygen as prior to admission 11.  Tobacco abuse.  Counseling 12.  New onset diabetes mellitus.  Hemoglobin A1c 7.5.  SSI.  Consider metformin.  CBG (last 3)  Recent Labs    03/14/21 1702 03/14/21 2107 03/15/21 0547  GLUCAP 192* 90 155*    -add amaryl 1mg  daily to start given reading and A1C 13.  Obesity.  BMI 31.63.  Dietary follow-up 14. Prerenal azotemia, mild baseline CKD?  -see #7    LOS: 1 days A FACE TO FACE EVALUATION WAS PERFORMED  Meredith Staggers 03/15/2021, 11:34 AM

## 2021-03-15 NOTE — Evaluation (Signed)
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    Emphysema    History of lung cancer    Hyperlipidemia    Hypertension    Oxygen dependent    2L continuous   Tobacco abuse    Past Surgical History:  Past Surgical History:  Procedure Laterality Date   HERNIA REPAIR Bilateral 4627   umbilical   VIDEO ASSISTED THORACOSCOPY Right 12/21/2018   Procedure: VIDEO ASSISTED THORACOSCOPY AND RESECTION OF BLEBS RIGHT LOWER LOBE;  Surgeon: [name], MD;  Location: [place];  Service: Thoracic;  Laterality: Right;   VIDEO ASSISTED THORACOSCOPY (VATS)/ LOBECTOMY Left 08/01/2016   Procedure: VIDEO ASSISTED THORACOSCOPY (VATS)/ LEFT UPPER LOBECTOMY with lymph node sampling and onQ placement;  Surgeon: [name], MD;  Location: [place];  Service: Thoracic;  Laterality: Left;   VIDEO ASSISTED THORACOSCOPY (VATS)/WEDGE RESECTION Right 12/09/2018   Procedure: VIDEO ASSISTED THORACOSCOPY (VATS)/WEDGE RESECTION;  Surgeon: [name], MD;  Location: [address] OR;  Service: Thoracic;  Laterality: Right;   VIDEO BRONCHOSCOPY Bilateral 12/21/2018   Procedure: VIDEO BRONCHOSCOPY;  Surgeon:  [name], MD;  Location: [place] OR;  Service: Thoracic;  Laterality: Bilateral;   VIDEO BRONCHOSCOPY WITH ENDOBRONCHIAL NAVIGATION N/A 07/03/2016   Procedure: VIDEO BRONCHOSCOPY WITH ENDOBRONCHIAL NAVIGATION;  Surgeon: [name], MD;  Location: [place];  Service: Thoracic;  Laterality: N/A;   VIDEO BRONCHOSCOPY WITH INSERTION OF INTERBRONCHIAL VALVE (IBV) N/A 12/11/2018   Procedure: VIDEO BRONCHOSCOPY WITH INSERTION OF INTERBRONCHIAL VALVE (IBV);  Surgeon: [name], MD;  Location: [facility] OR;  Service: Thoracic;  Laterality: N/A;   VIDEO BRONCHOSCOPY WITH INSERTION OF INTERBRONCHIAL VALVE (IBV) N/A 02/10/2019   Procedure: VIDEO BRONCHOSCOPY WITH REMOVAL OF INTERBRONCHIAL VALVE (IBV);  Surgeon: [name], MD;  Location: [facility] OR;  Service: Thoracic;  Laterality: N/A;    Assessment & Plan Clinical Impression: Patient is 70 year old right-handed male history of PAF maintained on aspirin, lung cancer with VATS procedure 12/21/2018 on the on the right and VATS 08/02/2019 on the left., emphysema on chronic O2 at 2 L, tobacco abuse, obesity with BMI 31.63, hypertension, hyperlipidemia. Presented to [facility] with acute onset of right side weakness/aphasia and nonradiating chest pain or shortness of breath.  CT/MRI/MRI of the head showed acute large left PCA territory infarction.  Left PCA occlusion at the P1-P2 junction.  Small acute right thalamic infarction. Echocardiogram ejection fraction of 50 to 03% grade 1 diastolic dysfunction. Patient with new findings diabetes mellitus hemoglobin A1c 7.5 placed on SSI.  Patient transferred to CIR on 03/14/2021 .    Patient currently requires min with basic self-care  skills secondary to muscle weakness, decreased cardiorespiratoy endurance, and decreased safety awareness and decreased memory.  Prior to hospitalization, patient could complete ADLs and IADLs with independent . Pt presents with mild aphasia and memory deficits and is  HOH during OT eval.   Patient will benefit from skilled intervention to decrease level of assist with basic self-care skills and increase level of independence with iADL prior to discharge home with care partner.  Anticipate patient will require 24 hour supervision and follow up home health.  OT - End of Session Activity Tolerance: Tolerates 30+ min activity with multiple rests Endurance Deficit: Yes Endurance Deficit Description: requires rest breaks during session OT Assessment Rehab Potential (ACUTE ONLY): Good OT Barriers to Discharge: Decreased caregiver support OT Barriers to Discharge Comments: limited caregiver support, wife works OT Patient demonstrates impairments in the following area(s): Balance;Cognition;Safety;Endurance;Motor OT Basic ADL's Functional Problem(s): Grooming;Bathing;Dressing;Toileting OT Advanced ADL's Functional Problem(s): Simple Meal Preparation;Light Housekeeping OT Transfers Functional Problem(s): Tub/Shower;Toilet OT Additional Impairment(s): None OT Plan OT Intensity: Minimum of 1-2 x/day, 45 to 90 minutes OT Frequency: 5 out of 7 days OT Duration/Estimated Length of Stay: 7-10 days OT Treatment/Interventions: Balance/vestibular training;Cognitive remediation/compensation;Discharge planning;DME/adaptive equipment instruction;Functional mobility training;Neuromuscular re-education;Pain management;Patient/family education;Self Care/advanced ADL retraining;Therapeutic Activities;Skin care/wound managment;Therapeutic Exercise;UE/LE Coordination activities;Visual/perceptual remediation/compensation;UE/LE Strength taining/ROM OT Self Feeding Anticipated Outcome(s): independent OT Basic Self-Care Anticipated Outcome(s): SUP OT Toileting Anticipated Outcome(s): SUP OT Bathroom Transfers Anticipated Outcome(s): SUP OT Recommendation Recommendations for Other Services: Speech consult Patient destination: Home Follow Up Recommendations: Home health OT;24 hour  supervision/assistance Equipment Recommended: 3 in 1 bedside comode;Tub/shower bench;To be determined   OT Evaluation Precautions/Restrictions  Precautions Precautions: Fall Restrictions Weight Bearing Restrictions: No General Chart Reviewed: Yes Response to Previous Treatment: Patient with no complaints from previous session Family/Caregiver Present: No  Pain Pain Assessment Pain Scale: Faces Pain Score: 0-No pain Faces Pain Scale: No hurt Home Living/Prior Functioning Home Living Family/patient expects to be discharged to:: Private residence Living Arrangements: Spouse/significant other Available Help at Discharge: Family, Available 24 hours/day Type of Home: House Home Access: Stairs to enter [employer] of Steps: 2 Entrance Stairs-Rails: Left, Right, Can reach both Home Layout: One level Bathroom Shower/Tub: Gaffer, Chiropodist: Programmer, systems: Yes  Lives With: Spouse IADL History Homemaking Responsibilities: Yes Meal Prep Responsibility: Therapist, occupational Responsibility: Secondary Homemaking Comments: completes yardwork Education: high school 9th grade Occupation: Retired Prior Function Level of Independence: Independent with basic ADLs, Independent with homemaking with ambulation  Able to Take Stairs?: Yes Driving: Yes Vocation: 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 Vision/History: Wears glasses Wears Glasses: Reading only Patient Visual Report: Eye fatigue/eye pain/headache;Peripheral vision impairment (pt reports R visual impairment, can turn head to see to compensate) Vision Assessment?: Yes;Vision impaired- to be further tested in functional context Eye Alignment: Impaired (comment) Ocular Range of Motion: Within Functional Limits Alignment/Gaze Preference: Within Defined Limits Saccades: Undershoots;Other (comment) (impaired R upper quadrant) Convergence: Impaired  (comment) Visual Fields: Right visual field deficit Depth Perception: Overshoots Perception  Perception: Impaired Praxis Praxis: Intact Cognition Overall Cognitive Status: Impaired/Different from baseline Arousal/Alertness: Awake/alert Orientation Level: Person;Place;Situation Person: Oriented Place: Oriented Situation: Oriented Year: Other (Comment) ("2005") Month: June Day of Week: Correct Memory: Impaired Memory Impairment: Storage deficit;Retrieval deficit;Decreased recall of new information;Decreased short term memory Decreased Short Term Memory: Verbal basic;Functional basic Immediate Memory Recall: Sock;Blue;Bed Memory Recall Sock: Not able to recall Memory Recall Blue: With Cue Memory Recall Bed: Not able to recall Attention: Focused Focused Attention:  Appears intact Awareness: Impaired Awareness Impairment: Emergent impairment;Anticipatory impairment Problem Solving: Impaired Problem Solving Impairment: Functional basic Executive Function: Organizing;Self Monitoring Organizing: Impaired Organizing Impairment: Verbal complex Self Monitoring: Appears intact Behaviors: Impulsive Safety/Judgment: Impaired Comments: to be further assessed Sensation Sensation Light Touch: Appears Intact Hot/Cold: Appears Intact Proprioception: Impaired Detail Proprioception Impaired Details: Impaired RUE (+ pronator drift, + thumb find test) Coordination Gross Motor Movements are Fluid and Coordinated: No Fine Motor Movements are Fluid and Coordinated: No Finger Nose Finger Test: slight overshooting RUE Heel Shin Test: Slight overshooting with RLE, self corrected Motor  Motor Motor: Within Functional Limits  Trunk/Postural Assessment  Cervical Assessment Cervical Assessment: Exceptions to [facility] (fwd head) Thoracic Assessment Thoracic Assessment: Exceptions to [facility] (rounded shoulders) Lumbar Assessment Lumbar Assessment: Exceptions to [facility] (posterior pelvic tilt)   Balance Balance Balance Assessed: Yes Standardized Balance Assessment Standardized Balance Assessment: Berg Balance Test Berg Balance Test Sit to Stand: Able to stand  independently using hands Standing Unsupported: Able to stand 2 minutes with supervision Sitting with Back Unsupported but Feet Supported on Floor or Stool: Able to sit safely and securely 2 minutes Stand to Sit: Controls descent by using hands Transfers: Able to transfer safely, definite need of hands Standing Unsupported with Eyes Closed: Able to stand 10 seconds with supervision Standing Ubsupported with Feet Together: Needs help to attain position but able to stand for 30 seconds with feet together From Standing, Reach Forward with Outstretched Arm: Can reach confidently >25 cm (10") From Standing Position, Pick up Object from Floor: Able to pick up shoe, needs supervision From Standing Position, Turn to Look Behind Over each Shoulder: Looks behind one side only/other side shows less weight shift Turn 360 Degrees: Needs assistance while turning Standing Unsupported, Alternately Place Feet on Step/Stool: Able to complete >2 steps/needs minimal assist Standing Unsupported, One Foot in Front: Needs help to step but can hold 15 seconds Standing on One Leg: Tries to lift leg/unable to hold 3 seconds but remains standing independently Total Score: 33 Extremity/Trunk Assessment RUE Assessment RUE Assessment: Within Functional Limits LUE Assessment LUE Assessment: Within Functional Limits  Care Tool Care Tool Self Care Eating   Eating Assist Level: Set up assist    Oral Care    Oral Care Assist Level: Supervision/Verbal cueing    Bathing   Body parts bathed by patient: Right arm;Left arm;Abdomen;Chest;Front perineal area;Buttocks;Right upper leg;Left upper leg;Left lower leg;Right lower leg;Face     Assist Level: Contact Guard/Touching assist    Upper Body Dressing(including orthotics)   What is the patient  wearing?: Pull over shirt   Assist Level: Supervision/Verbal cueing    Lower Body Dressing (excluding footwear)   What is the patient wearing?: Pants Assist for lower body dressing: Moderate Assistance - Patient 50 - 74%    Putting on/Taking off footwear   What is the patient wearing?: Non-skid slipper socks Assist for footwear: Moderate Assistance - Patient 50 - 74%       Care Tool Toileting Toileting activity   Assist for toileting: Minimal Assistance - Patient > 75%      Care Tool Transfers Sit to stand transfer   Sit to stand assist level: Contact Guard/Touching assist    Chair/bed transfer   Chair/bed transfer assist level: Contact Guard/Touching assist     Toilet transfer   Assist Level: Contact Guard/Touching assist     Care Tool Cognition Expression of Ideas and Wants Expression of Ideas and Wants: Some difficulty - exhibits some difficulty with expressing needs  and ideas (e.g, some words or finishing thoughts) or speech is not clear   Understanding Verbal and Non-Verbal Content Understanding Verbal and Non-Verbal Content: Usually understands - understands most conversations, but misses some part/intent of message. Requires cues at times to understand   Memory/Recall Ability *first 3 days only Memory/Recall Ability *first 3 days only: That he or she is in a hospital/hospital unit;Staff names and faces    Refer to Care Plan for Long Term Goals  SHORT TERM GOAL WEEK 1 OT Short Term Goal 1 (Week 1): Pt will complete LB dressing with CGA and AE prn. OT Short Term Goal 2 (Week 1): Pt will complete grooming and oral care standing sink side with SUP. OT Short Term Goal 3 (Week 1): Pt will be able to manage all aspects of footwear with supervision and AE prn.  Recommendations for other services: None    Skilled Therapeutic Intervention  Pt received in room in w/c and consented to OT eval and tx. Pt on 0.5-1L of O2 via [place], O2 sat 98% and HR 88. Pt demo'd some  word-finding and memory deficits on eval, was aware of his deficits and asked multiple times if his memory was going to get better. Session initiated with eval/PLOF questions which were clarified in the chart before beginning self care retraining. See assist levels below. Pt was very HOH during eval and required therapist to repeat questions multiple times. After tx, pt left up in w/c with alarm on, call light in reach, and all needs met on 1L O2.   ADL ADL Eating: Set up Grooming: Supervision/safety Where Assessed-Grooming: Sitting at sink Upper Body Bathing: Supervision/safety Where Assessed-Upper Body Bathing: Shower Lower Body Bathing: Minimal assistance Where Assessed-Lower Body Bathing: Shower Upper Body Dressing: Supervision/safety Where Assessed-Upper Body Dressing: Chair Lower Body Dressing: Moderate assistance Where Assessed-Lower Body Dressing: Chair Toileting: Minimal assistance Where Assessed-Toileting: Glass blower/designer: Therapist, music Method: Ambulating (with RW) Science writer: Energy manager: Not assessed Social research officer, government: Contact guard;Moderate cueing Social research officer, government Method: Heritage manager: Cabin crew Sit to Stand: Contact Guard/Touching assist   Discharge Criteria: Patient will be discharged from OT if patient refuses treatment 3 consecutive times without medical reason, if treatment goals not met, if there is a change in medical status, if patient makes no progress towards goals or if patient is discharged from hospital.  The above assessment, treatment plan, treatment alternatives and goals were discussed and mutually agreed upon: by patient  Paulette Blanch 03/15/2021, 12:18 PM

## 2021-03-15 NOTE — Evaluation (Signed)
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    Emphysema    History of lung cancer    Hyperlipidemia    Hypertension    Oxygen dependent    2L continuous   Tobacco abuse    Past Surgical History:  Past Surgical History:  Procedure Laterality Date   HERNIA REPAIR Bilateral 2703   umbilical   VIDEO ASSISTED THORACOSCOPY Right 12/21/2018   Procedure: VIDEO ASSISTED THORACOSCOPY AND RESECTION OF BLEBS RIGHT LOWER LOBE;  Surgeon: [name], MD;  Location: [place];  Service: Thoracic;  Laterality: Right;   VIDEO ASSISTED THORACOSCOPY (VATS)/ LOBECTOMY Left 08/01/2016   Procedure: VIDEO ASSISTED THORACOSCOPY (VATS)/ LEFT UPPER LOBECTOMY with lymph node sampling and onQ placement;  Surgeon: [name], MD;  Location: [place];  Service: Thoracic;  Laterality: Left;   VIDEO ASSISTED THORACOSCOPY (VATS)/WEDGE RESECTION Right 12/09/2018   Procedure: VIDEO ASSISTED THORACOSCOPY (VATS)/WEDGE RESECTION;  Surgeon: [name], MD;  Location: [place] OR;  Service: Thoracic;  Laterality: Right;   VIDEO BRONCHOSCOPY Bilateral 12/21/2018   Procedure: VIDEO BRONCHOSCOPY;  Surgeon: [name], MD;  Location: [place] OR;  Service: Thoracic;  Laterality:  Bilateral;   VIDEO BRONCHOSCOPY WITH ENDOBRONCHIAL NAVIGATION N/A 07/03/2016   Procedure: VIDEO BRONCHOSCOPY WITH ENDOBRONCHIAL NAVIGATION;  Surgeon: [name], MD;  Location: [place];  Service: Thoracic;  Laterality: N/A;   VIDEO BRONCHOSCOPY WITH INSERTION OF INTERBRONCHIAL VALVE (IBV) N/A 12/11/2018   Procedure: VIDEO BRONCHOSCOPY WITH INSERTION OF INTERBRONCHIAL VALVE (IBV);  Surgeon: [name], MD;  Location: [facility] OR;  Service: Thoracic;  Laterality: N/A;   VIDEO BRONCHOSCOPY WITH INSERTION OF INTERBRONCHIAL VALVE (IBV) N/A 02/10/2019   Procedure: VIDEO BRONCHOSCOPY WITH REMOVAL OF INTERBRONCHIAL VALVE (IBV);  Surgeon: [name], MD;  Location: [facility];  Service: Thoracic;  Laterality: N/A;    Assessment / Plan / Recommendation Clinical Impression   Randall Sullivan is a 70 year old right-handed male history of PAF maintained on aspirin, lung cancer with VATS procedure 12/21/2018 on the on the right and VATS 08/02/2019 on the left., emphysema on chronic O2 at 2 L, tobacco abuse, obesity with BMI 31.63, hypertension, hyperlipidemia.  Per chart review patient lives with spouse.  Independent community ambulator.  1 level home 3 steps to entry.  Presented to [facility] with acute onset of right side weakness/aphasia and nonradiating chest pain or shortness of breath.  CT/MRI/MRI of the head showed acute large left PCA territory infarction.  Left PCA occlusion at the P1-P2 junction.  Small acute right thalamic infarction.  No hemorrhage or mass-effect.  Patient did not receive tPA.  Carotid ultrasound with no ICA stenosis.  Chest x-ray no active disease.  Echocardiogram ejection fraction of 50 to 21% grade 1 diastolic dysfunction.  Admission chemistries alcohol negative, sodium 134, potassium 3.4, creatinine 1.04, BNP 40, MRSA PCR screening negative, urine drug screen negative, hemoglobin A1c 7.5.  Currently maintained on aspirin 325 mg daily for CVA prophylaxis and then  transition to ELIQUIS 5 mg BID beginning 03/19/2021..  Subcutaneous Lovenox for DVT prophylaxis.  Patient with new findings diabetes mellitus hemoglobin A1c 7.5 placed on SSI.  Tolerating a regular consistency diet.  Therapy evaluations completed due to patient's right side weakness was admitted for a comprehensive rehab program.  Pt presents with moderate expressive, mild receptive aphasia, and mild cogntive impairment. Suspected some difficulty with following commands was due to inability to hear slp. He reports a 10th grade education and was not able to read prior to CVA. He reported his wife handled all finances prior to CVA. Pt  was oriented to day of the week but needed cues for time, month, year, and city we are in. He was able to recall medical events but was unable to recall any medical information given to him or awareness of purpose for rehab stay. He required increased time and repetition to respond to simple questions and follow 2 step instructions and often stated "I didn't understand" appearing confused without responding when given verbal information. Suspected visual field cut noted when attempting to search for objects on table to follow commands. Confrontational naming completed with 1/10 accuracy. Slight improvement with carrier phrases.  He did not recall completing any therapy prior to this session  however he completed PT session this morning and OT session right before this session began. He demonstrated awareness of decreased memory and decreased word finding since CVA. Pt will benefit from skilled SLP intervention to address language and cognition.    Skilled Therapeutic Interventions          SLP evaluation completed. Plan of care reviewed with patient and wife and they are in agreement with plan.    SLP Assessment  Patient will need skilled [facility] during CIR admission    Recommendations  Patient destination: Home Follow up Recommendations: Home  Health SLP Equipment Recommended: None recommended by SLP    SLP Frequency 3 to 5 out of 7 days   SLP Duration  SLP Intensity  SLP Treatment/Interventions 7-10 days  Minumum of 1-2 x/day, 30 to 90 minutes  Cognitive remediation/compensation;Speech/Language facilitation;Therapeutic Activities;Patient/family education    Pain Pain Assessment Pain Scale: Faces Faces Pain Scale: No hurt  Prior Functioning Cognitive/Linguistic Baseline: Within functional limits Type of Home: House  Lives With: Spouse Available Help at Discharge: Family;Available 24 hours/day Education: high school 9th grade Vocation: Retired  Programmer, systems Overall Cognitive Status: Impaired/Different from baseline Arousal/Alertness: Awake/alert Orientation Level: Oriented to person;Oriented to time  Comprehension Auditory Comprehension Overall Auditory Comprehension: Impaired Yes/No Questions: Within Functional Limits Commands: Impaired Two Step Basic Commands: 50-74% accurate Multistep Basic Commands: 50-74% accurate Complex Commands: 50-74% accurate Conversation: Simple Interfering Components: Hearing EffectiveTechniques: Increased volume;Stressing words;Visual/Gestural cues Visual Recognition/Discrimination Discrimination: Not tested Pictures: Able in field of 2 Reading Comprehension Reading Status: Within funtional limits (Pt unable to read prior to CVA) Word level: Unable to assess (comment) Expression Expression Primary Mode of Expression: Verbal Verbal Expression Overall Verbal Expression: Impaired Initiation: No impairment Level of Generative/Spontaneous Verbalization: Phrase Repetition: No impairment Naming: Impairment Responsive: 51-75% accurate Confrontation: Impaired Pictures: Able in field of 2 Convergent: Not tested Divergent: 50-74% accurate Other Naming Comments: 0/8 confrontation naming. slight improvement  with carrier phrases. Verbal Errors: Semantic  paraphasias;Perseveration;Aware of errors Pragmatics: No impairment Effective Techniques: Semantic cues;Sentence completion;Phonemic cues Written Expression Dominant Hand: Right Self Formulation Ability: Phrase Oral Motor Oral Motor/Sensory Function Overall Oral Motor/Sensory Function: Within functional limits  Care Tool Care Tool Cognition Expression of Ideas and Wants Expression of Ideas and Wants: Some difficulty - exhibits some difficulty with expressing needs and ideas (e.g, some words or finishing thoughts) or speech is not clear   Understanding Verbal and Non-Verbal Content Understanding Verbal and Non-Verbal Content: Usually understands - understands most conversations, but misses some part/intent of message. Requires cues at times to understand   Memory/Recall Ability *first 3 days only          Short Term Goals: Week 1: SLP Short Term Goal 1 (Week 1): Pt will name familiar objects with mod A verbal cues and 80% accuracy. SLP Short Term Goal 2 (Week 1): Pt will follow 2 step commands on 80% of opportunities with mod A verbal cues. SLP Short Term Goal 3 (Week 1): Pt will orient to date, day of the week, and month with min a verbal cues and visual aid. SLP Short Term Goal 4 (Week 1): Pt will demonstrate emergent awareness of deficits with min A verbal cues.  Refer to Care Plan for Long Term Goals  Recommendations for other services: None   Discharge Criteria: Patient will be discharged from SLP if patient refuses treatment 3 consecutive times without medical reason, if treatment goals not met, if there is a change in medical status, if patient makes no progress towards goals or if patient is discharged from hospital.  The above assessment, treatment plan, treatment alternatives and goals were discussed and mutually agreed upon: by patient and by family  Darrol Poke Mangum 03/15/2021, 3:29 PM

## 2021-03-15 NOTE — Progress Notes (Signed)
Inpatient [facility] Individual Statement of Services  Patient Name:  Randall Sullivan  Date:  03/15/2021  Welcome to the [facility].  Our goal is to provide you with an individualized program based on your diagnosis and situation, designed to meet your specific needs.  With this comprehensive rehabilitation program, you will be expected to participate in at least 3 hours of rehabilitation therapies Monday-Friday, with modified therapy programming on the weekends.  Your rehabilitation program will include the following services:  Physical Therapy (PT), Occupational Therapy (OT), Speech Therapy (ST), 24 hour per day rehabilitation nursing, Therapeutic Recreaction (TR), Neuropsychology, Care Coordinator, Rehabilitation Medicine, Nutrition Services, Pharmacy Services, and Other  Weekly team conferences will be held on Wednesdays to discuss your progress.  Your Inpatient Rehabilitation Care Coordinator will talk with you frequently to get your input and to update you on team discussions.  Team conferences with you and your family in attendance may also be held.  Expected length of stay: 12-14 Days  Overall anticipated outcome: Min A  Depending on your progress and recovery, your program may change. Your Inpatient Rehabilitation Care Coordinator will coordinate services and will keep you informed of any changes. Your Inpatient Rehabilitation Care Coordinator's name and contact numbers are listed  below.  The following services may also be recommended but are not provided by the [facility]:   [place] will be made to provide these services after discharge if needed.  Arrangements include referral to agencies that provide these services.  Your insurance has been verified to be:  Medicare A & B Your primary doctor is:  Hurshel Party, MD  Pertinent information will be shared  with your doctor and your insurance company.  Inpatient Rehabilitation Care Coordinator:  Erlene Quan, [place] or [phone]  Information discussed with and copy given to patient by: Dyanne Iha, 03/15/2021, 11:57 AM

## 2021-03-15 NOTE — Progress Notes (Signed)
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    Emphysema    History of lung cancer    Hyperlipidemia    Hypertension    Oxygen dependent    2L continuous   Tobacco abuse    Past Surgical History:  Past Surgical History:  Procedure Laterality Date   HERNIA REPAIR Bilateral 8453   umbilical   VIDEO ASSISTED THORACOSCOPY Right 12/21/2018   Procedure: VIDEO ASSISTED THORACOSCOPY AND RESECTION OF BLEBS RIGHT LOWER LOBE;  Surgeon: [name], MD;  Location: [place];  Service: Thoracic;  Laterality: Right;   VIDEO ASSISTED THORACOSCOPY (VATS)/ LOBECTOMY Left 08/01/2016   Procedure: VIDEO ASSISTED THORACOSCOPY (VATS)/ LEFT UPPER LOBECTOMY with lymph node sampling and onQ placement;  Surgeon: [name], MD;  Location: [place];  Service: Thoracic;  Laterality: Left;   VIDEO ASSISTED THORACOSCOPY (VATS)/WEDGE RESECTION Right 12/09/2018   Procedure: VIDEO ASSISTED THORACOSCOPY (VATS)/WEDGE RESECTION;  Surgeon: [name], MD;  Location: [place] OR;  Service: Thoracic;  Laterality: Right;   VIDEO BRONCHOSCOPY Bilateral 12/21/2018   Procedure: VIDEO BRONCHOSCOPY;  Surgeon: [name], MD;  Location: [place] OR;  Service: Thoracic;  Laterality: Bilateral;   VIDEO BRONCHOSCOPY WITH ENDOBRONCHIAL NAVIGATION N/A 07/03/2016   Procedure: VIDEO BRONCHOSCOPY WITH ENDOBRONCHIAL NAVIGATION;  Surgeon:  [name], MD;  Location: [place];  Service: Thoracic;  Laterality: N/A;   VIDEO BRONCHOSCOPY WITH INSERTION OF INTERBRONCHIAL VALVE (IBV) N/A 12/11/2018   Procedure: VIDEO BRONCHOSCOPY WITH INSERTION OF INTERBRONCHIAL VALVE (IBV);  Surgeon: [name], MD;  Location: [facility] OR;  Service: Thoracic;  Laterality: N/A;   VIDEO BRONCHOSCOPY WITH INSERTION OF INTERBRONCHIAL VALVE (IBV) N/A 02/10/2019   Procedure: VIDEO BRONCHOSCOPY WITH REMOVAL OF INTERBRONCHIAL VALVE (IBV);  Surgeon: [name], MD;  Location: [facility] OR;  Service: Thoracic;  Laterality: N/A;   Social History:  reports that he quit smoking about 4 years ago. His smoking use included cigarettes. He has a 40.00 pack-year smoking history. He has never used smokeless tobacco. He reports previous alcohol use. He reports that he does not use drugs.  Family / Support Systems Marital Status: Married Patient Roles: Spouse Spouse/Significant Other: Randall Haw Anticipated Caregiver: Randall Sullivan Ability/Limitations of Caregiver: Min A Caregiver Availability: 24/7  Social History Preferred language: English Religion: Baptist Read: Yes Write: Yes Legal History/Current Legal Issues: n/a Guardian/Conservator: n/a   Abuse/Neglect Abuse/Neglect Assessment Can Be Completed: Yes Physical Abuse: Denies Verbal Abuse: Denies Sexual Abuse: Denies Exploitation of patient/patient's resources: Denies Self-Neglect: Denies  Emotional Status Pt's affect, behavior and adjustment status: pt pleasant, having lunch Recent Psychosocial Issues: n/a Psychiatric History: n/a Substance Abuse History: n/a  Patient / Family Perceptions, Expectations & Goals Pt/Family understanding of illness & functional limitations: yes Premorbid pt/family roles/activities: Pt independent and active in community Anticipated changes in roles/activities/participation: spouse can asssit to Min A level  Pt/family expectations/goals: [employer]: None Premorbid Home Care/DME Agencies: Other (Comment) Radio broadcast assistant) Transportation available at discharge: Family able to transport  Discharge Planning Living Arrangements: Spouse/significant other Support Systems: Spouse/significant other Type of Residence: Private residence (1 level home, 3 steps to enter) Administrator, sports: Chartered certified accountant Resources: Fish farm manager, SSD Financial Screen Referred: No Living Expenses: Own Money Management: Spouse Does the patient have any problems obtaining your medications?: No Home Management: Independent Patient/Family Preliminary Plans: Spouse able to assit Care Coordinator Anticipated Follow Up Needs: HH/OP Expected length of stay: 12-14 Days  Clinical Impression Sw met with patient, introduced self. Left sw contact information for pt spouse. No questions or concerns, sw will cont to follow up  Dyanne Iha 03/15/2021, 1:07 PM

## 2021-03-15 NOTE — Evaluation (Signed)
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    Emphysema    History of lung cancer    Hyperlipidemia    Hypertension    Oxygen dependent    2L continuous   Tobacco abuse    Past Surgical History:  Past Surgical History:  Procedure Laterality Date   HERNIA REPAIR Bilateral 3614   umbilical   VIDEO ASSISTED THORACOSCOPY Right 12/21/2018   Procedure: VIDEO ASSISTED THORACOSCOPY AND RESECTION OF BLEBS RIGHT LOWER LOBE;  Surgeon: [name], MD;  Location: [place];  Service: Thoracic;  Laterality: Right;   VIDEO ASSISTED THORACOSCOPY (VATS)/ LOBECTOMY Left 08/01/2016   Procedure: VIDEO ASSISTED THORACOSCOPY (VATS)/ LEFT UPPER LOBECTOMY with lymph node sampling and onQ placement;  Surgeon: [name], MD;  Location: [place];  Service: Thoracic;  Laterality: Left;   VIDEO ASSISTED THORACOSCOPY (VATS)/WEDGE RESECTION Right 12/09/2018   Procedure: VIDEO ASSISTED THORACOSCOPY (VATS)/WEDGE RESECTION;  Surgeon: [name], MD;  Location: [place] OR;  Service: Thoracic;  Laterality: Right;   VIDEO BRONCHOSCOPY Bilateral 12/21/2018    Procedure: VIDEO BRONCHOSCOPY;  Surgeon: [name], MD;  Location: [place] OR;  Service: Thoracic;  Laterality: Bilateral;   VIDEO BRONCHOSCOPY WITH ENDOBRONCHIAL NAVIGATION N/A 07/03/2016   Procedure: VIDEO BRONCHOSCOPY WITH ENDOBRONCHIAL NAVIGATION;  Surgeon: [name], MD;  Location: [place];  Service: Thoracic;  Laterality: N/A;   VIDEO BRONCHOSCOPY WITH INSERTION OF INTERBRONCHIAL VALVE (IBV) N/A 12/11/2018   Procedure: VIDEO BRONCHOSCOPY WITH INSERTION OF INTERBRONCHIAL VALVE (IBV);  Surgeon: [name], MD;  Location: [facility] OR;  Service: Thoracic;  Laterality: N/A;   VIDEO BRONCHOSCOPY WITH INSERTION OF INTERBRONCHIAL VALVE (IBV) N/A 02/10/2019   Procedure: VIDEO BRONCHOSCOPY WITH REMOVAL OF INTERBRONCHIAL VALVE (IBV);  Surgeon: [name], MD;  Location: [facility] OR;  Service: Thoracic;  Laterality: N/A;    Assessment & Plan Clinical Impression: Patient is a 70 year old right-handed male history of PAF maintained on aspirin, lung cancer with VATS procedure 12/21/2018 on the on the right and VATS 08/02/2019 on the left., emphysema on chronic O2 at 2 L, tobacco abuse, obesity with BMI 31.63, hypertension, hyperlipidemia.  Per chart review patient lives with spouse.  Independent community ambulator.  1 level home 3 steps to entry.  Presented to [facility] with acute onset of right side weakness/aphasia and nonradiating chest pain or shortness of breath.  CT/MRI/MRI of the head showed acute large left PCA territory infarction.  Left PCA occlusion at the P1-P2 junction.  Small acute right thalamic infarction.  No hemorrhage or mass-effect.  Patient did not receive tPA.  Carotid ultrasound  with no ICA stenosis.  Chest x-ray no active disease.  Echocardiogram ejection fraction of 50 to 38% grade 1 diastolic dysfunction.  Admission chemistries alcohol negative, sodium 134, potassium 3.4, creatinine 1.04, BNP 40, MRSA PCR screening negative, urine drug screen negative,  hemoglobin A1c 7.5.  Patient transferred to CIR on 03/14/2021 .   Patient currently requires mod with mobility secondary to muscle weakness and muscle joint tightness, decreased cardiorespiratoy endurance, impaired timing and sequencing, unbalanced muscle activation, ataxia, and decreased coordination, decreased visual perceptual skills, field cut, and hemianopsia, decreased midline orientation and decreased attention to right, and decreased sitting balance, decreased standing balance, decreased postural control, hemiplegia, and decreased balance strategies.  Prior to hospitalization, patient was independent  with mobility and lived with Spouse in a House home.  Home access is 2Stairs to enter.  Patient will benefit from skilled PT intervention to maximize safe functional mobility, minimize fall risk, and decrease caregiver burden for planned discharge home with intermittent assist.  Anticipate patient will benefit from follow up [facility] at discharge.  PT - End of Session Activity Tolerance: Tolerates 10 - 20 min activity with multiple rests Endurance Deficit: Yes Endurance Deficit Description: requires rest breaks during session PT Assessment Rehab Potential (ACUTE/IP ONLY): Good PT Barriers to Discharge: Inaccessible home environment;Home environment access/layout PT Patient demonstrates impairments in the following area(s): Balance;Safety;Behavior;Sensory;Endurance;Motor;Perception PT Transfers Functional Problem(s): Bed Mobility;Bed to Chair;Car;Furniture;Floor PT Locomotion Functional Problem(s): Ambulation;Stairs;Wheelchair Mobility PT Plan PT Intensity: Minimum of 1-2 x/day ,45 to 90 minutes PT Frequency: 5 out of 7 days PT Duration Estimated Length of Stay: 7-10 days PT Treatment/Interventions: Ambulation/gait training;Community reintegration;DME/adaptive equipment instruction;Psychosocial support;Neuromuscular re-education;Stair training;UE/LE Strength taining/ROM;Wheelchair  propulsion/positioning;Balance/vestibular training;Discharge planning;Functional electrical stimulation;Pain management;Skin care/wound management;UE/LE Coordination activities;Therapeutic Activities;Cognitive remediation/compensation;Disease management/prevention;Functional mobility training;Patient/family education;Visual/perceptual remediation/compensation;Therapeutic Exercise;Splinting/orthotics PT Transfers Anticipated Outcome(s): Supervision assist with LRAD PT Locomotion Anticipated Outcome(s): Ambulatory at [place] assist level a with LRAD PT Recommendation Recommendations for Other Services: Therapeutic Recreation consult Therapeutic Recreation Interventions: Outing/community reintergration;Stress management Follow Up Recommendations: Home health PT Patient destination: Home Equipment Recommended: To be determined;Rolling walker with 5" wheels   PT Evaluation Precautions/Restrictions Precautions Precautions: Fall Restrictions Weight Bearing Restrictions: No General   Vital SignsTherapy Vitals Temp: 98.1 F (36.7 C) Pulse Rate: 82 Resp: 19 BP: 131/85 Patient Position (if appropriate): Lying Oxygen Therapy SpO2: 99 % O2 Device: Nasal Cannula O2 Flow Rate (L/min): 1 L/min Pain Pain Assessment Pain Scale: Faces Faces Pain Scale: No hurt Home Living/Prior Functioning Home Living Living Arrangements: Spouse/significant other Available Help at Discharge: Family;Available 24 hours/day Type of Home: House Home Access: Stairs to enter [employer] of Steps: 2 Entrance Stairs-Rails: Left;Right;Can reach both Home Layout: One level Bathroom Shower/Tub: Walk-in shower;Tub/shower unit 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  Lives With: Spouse Prior Function Level of Independence: Independent with basic ADLs;Independent with homemaking with ambulation  Able to Take Stairs?: Yes Driving: Yes Vocation: Retired 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 Eye Alignment: Impaired (comment) Ocular Range of Motion: Within Functional Limits Alignment/Gaze Preference: Within Defined Limits Saccades: Undershoots;Other (comment) (impaired R upper quadrant) Convergence: Impaired (comment) Perception Perception: Impaired Praxis Praxis: Intact  Cognition Overall Cognitive Status: Impaired/Different from baseline Arousal/Alertness: Awake/alert Orientation Level: Oriented to person;Oriented to time Attention: Focused Focused Attention: Appears intact Memory: Impaired Memory Impairment: Storage deficit;Retrieval deficit;Decreased recall of new information;Decreased short term memory Decreased Short Term Memory: Verbal basic;Functional basic Immediate Memory Recall: Sock;Blue;Bed Memory Recall Sock: Not able to recall Memory Recall Blue: With Cue  Memory Recall Bed: Not able to recall Awareness: Impaired Awareness Impairment: Emergent impairment;Anticipatory impairment Problem Solving: Impaired Problem Solving Impairment: Functional basic Executive Function: Organizing;Self Monitoring Organizing: Impaired Organizing Impairment: Verbal complex Self Monitoring: Appears intact Behaviors: Impulsive Safety/Judgment: Impaired Comments: to be further assessed Sensation Sensation Light Touch: Appears Intact Hot/Cold: Appears Intact Proprioception: Impaired Detail Proprioception Impaired Details: Impaired RUE (+ pronator drift, + thumb find test) Coordination Gross Motor Movements are Fluid and Coordinated: No Fine Motor Movements are Fluid and Coordinated: No Finger Nose Finger Test: slight overshooting RUE Heel Shin Test: Slight overshooting with RLE, self corrected Motor  Motor Motor: Within Functional Limits Motor - Skilled Clinical Observations: dysmetria/mild ataxia   Trunk/Postural Assessment  Cervical Assessment Cervical Assessment: Exceptions to [facility] (fwd head) Thoracic  Assessment Thoracic Assessment: Exceptions to [facility] (rounded shoulders) Lumbar Assessment Lumbar Assessment: Exceptions to [facility] (posterior pelvic tilt) Postural Control Postural Control: Deficits on evaluation (mild R bias)  Balance   Extremity Assessment  RUE Assessment RUE Assessment: Within Functional Limits LUE Assessment LUE Assessment: Within Functional Limits RLE Assessment RLE Assessment: Within Functional Limits LLE Assessment LLE Assessment: Within Functional Limits  Care Tool Care Tool Bed Mobility Roll left and right activity   Roll left and right assist level: Supervision/Verbal cueing Roll left and right assistive device comment: bedrails  Sit to lying activity   Sit to lying assist level: Contact Guard/Touching assist    Lying to sitting edge of bed activity   Lying to sitting edge of bed assist level: Contact Guard/Touching assist     Care Tool Transfers Sit to stand transfer   Sit to stand assist level: Minimal Assistance - Patient > 75%    Chair/bed transfer   Chair/bed transfer assist level: Moderate Assistance - Patient 50 - 74%     Toilet transfer   Assist Level: Contact Guard/Touching assist    Car transfer   Car transfer assist level: Moderate Assistance - Patient 50 - 74%      Care Tool Locomotion Ambulation   Assist level: Moderate Assistance - Patient 50 - 74% Assistive device: Hand held assist Max distance: 50  Walk 10 feet activity   Assist level: Moderate Assistance - Patient - 50 - 74%     Walk 50 feet with 2 turns activity   Assist level: Moderate Assistance - Patient - 50 - 74%    Walk 150 feet activity Walk 150 feet activity did not occur: Safety/medical concerns      Walk 10 feet on uneven surfaces activity Walk 10 feet on uneven surfaces activity did not occur: Safety/medical concerns      Stairs Stair activity did not occur: Safety/medical concerns        Walk up/down 1 step activity Walk up/down 1 step or curb (drop  down) activity did not occur: Safety/medical concerns     Walk up/down 4 steps activity did not occuR: Safety/medical concerns  Walk up/down 4 steps activity      Walk up/down 12 steps activity Walk up/down 12 steps activity did not occur: Safety/medical concerns      Pick up small objects from floor   Pick up small object from the floor assist level: Minimal Assistance - Patient > 75%    Wheelchair       Wheelchair assist level: Minimal Assistance - Patient > 75%    Wheel 50 feet with 2 turns activity   Assist Level: Minimal Assistance - Patient > 75%  Wheel 150 feet activity   Assist Level:  Minimal Assistance - Patient > 75%    Refer to Care Plan for Long Term Goals  SHORT TERM GOAL WEEK 1 PT Short Term Goal 1 (Week 1): STG=LTG due to ELOS  Recommendations for other services: Therapeutic Recreation  Stress management and Outing/community reintegration  Skilled Therapeutic Intervention  Pt received supine in bed and agreeable to PT. Supine>sit transfer with min assist and cues for safety. Pt reports needing time to eat and RN present for administer breathing treatment. When PT returned Pt noted to be on 7 L.min O2 via nasal canula. PT lowered O2 to 2 L/min with SpO2 98%. RN supervisor notified. PT instructed patient in PT Evaluation and initiated treatment intervention; see above for results. PT educated patient in [place], rehab potential, rehab goals, and discharge recommendations along with recommendation for follow-up rehabilitation services. Gait training without and with AD as listed below with min assist with use of RW. Pt use restroom for urination with cues for safety with ambulatory transfer to toilet. Patient demonstrates increased fall risk as noted by score of   33/56 on Berg Balance Scale.  (<36= high risk for falls, close to 100%; 37-45 significant >80%; 46-51 moderate >50%; 52-55 lower >25%). Pt performed 5xSTS: 17 sec (>15 sec indicates increased fall risk). Car transfer  with mod assist for safety and visual scanning for UE support on door frame. Patient returned to room and left sitting in [facility] with call bell in reach and all needs met.         Mobility Bed Mobility Bed Mobility: Supine to Sit;Sit to Supine Supine to Sit: Contact Guard/Touching assist;Minimal Assistance - Patient > 75% Sit to Supine: Contact Guard/Touching assist;Minimal Assistance - Patient > 75% Transfers Transfers: Sit to [employer] Transfers Sit to Stand: Minimal Assistance - Patient > 75% Stand Pivot Transfers: Minimal Assistance - Patient > 75%;Moderate Assistance - Patient 50 - 74% Transfer (Assistive device): None Locomotion  Gait Ambulation: Yes Gait Assistance: Minimal Assistance - Patient > 75% Gait Distance (Feet): 70 Feet Assistive device: Rolling walker Gait Gait: Yes Gait Pattern: Impaired Gait Pattern: Narrow base of support;Trunk flexed;Decreased step length - right Stairs / Additional Locomotion Stairs: Yes Stairs Assistance: Minimal Assistance - Patient > 75% Stair Management Technique: Two rails Number of Stairs: 8 Height of Stairs: 6 Wheelchair Mobility Wheelchair Mobility: Yes Wheelchair Assistance: Minimal assistance - Patient >75% Wheelchair Propulsion: Both upper extremities Wheelchair Parts Management: Needs assistance Distance: 150   Discharge Criteria: Patient will be discharged from PT if patient refuses treatment 3 consecutive times without medical reason, if treatment goals not met, if there is a change in medical status, if patient makes no progress towards goals or if patient is discharged from hospital.  The above assessment, treatment plan, treatment alternatives and goals were discussed and mutually agreed upon: by patient  Lorie Phenix 03/15/2021, 1:02 PM

## 2021-03-15 NOTE — Plan of Care (Signed)
  Problem: RH Balance Goal: LTG Patient will maintain dynamic sitting balance (PT) Description: LTG:  Patient will maintain dynamic sitting balance with assistance during mobility activities (PT) Flowsheets (Taken 03/15/2021 1116) LTG: Pt will maintain dynamic sitting balance during mobility activities with:: Independent Goal: LTG Patient will maintain dynamic standing balance (PT) Description: LTG:  Patient will maintain dynamic standing balance with assistance during mobility activities (PT) Flowsheets (Taken 03/15/2021 1116) LTG: Pt will maintain dynamic standing balance during mobility activities with:: Supervision/Verbal cueing   Problem: RH Bed Mobility Goal: LTG Patient will perform bed mobility with assist (PT) Description: LTG: Patient will perform bed mobility with assistance, with/without cues (PT). Flowsheets (Taken 03/15/2021 1116) LTG: Pt will perform bed mobility with assistance level of: Independent with assistive device    Problem: RH Bed to Chair Transfers Goal: LTG Patient will perform bed/chair transfers w/assist (PT) Description: LTG: Patient will perform bed to chair transfers with assistance (PT). Flowsheets (Taken 03/15/2021 1116) LTG: Pt will perform Bed to Chair Transfers with assistance level: Independent with assistive device    Problem: RH Car Transfers Goal: LTG Patient will perform car transfers with assist (PT) Description: LTG: Patient will perform car transfers with assistance (PT). Flowsheets (Taken 03/15/2021 1116) LTG: Pt will perform car transfers with assist:: Supervision/Verbal cueing   Problem: RH Furniture Transfers Goal: LTG Patient will perform furniture transfers w/assist (OT/PT) Description: LTG: Patient will perform furniture transfers  with assistance (OT/PT). Flowsheets (Taken 03/15/2021 1116) LTG: Pt will perform furniture transfers with assist:: Supervision/Verbal cueing   Problem: RH Ambulation Goal: LTG Patient will ambulate in  controlled environment (PT) Description: LTG: Patient will ambulate in a controlled environment, # of feet with assistance (PT). Flowsheets (Taken 03/15/2021 1116) LTG: Pt will ambulate in controlled environ  assist needed:: Supervision/Verbal cueing LTG: Ambulation distance in controlled environment: 153ft with LRAD Goal: LTG Patient will ambulate in home environment (PT) Description: LTG: Patient will ambulate in home environment, # of feet with assistance (PT). Flowsheets (Taken 03/15/2021 1116) LTG: Pt will ambulate in home environ  assist needed:: Supervision/Verbal cueing LTG: Ambulation distance in home environment: 52ft with LRAD   Problem: RH Wheelchair Mobility Goal: LTG Patient will propel w/c in controlled environment (PT) Description: LTG: Patient will propel wheelchair in controlled environment, # of feet with assist (PT) Flowsheets (Taken 03/15/2021 1116) LTG: Pt will propel w/c in controlled environ  assist needed:: Supervision/Verbal cueing LTG: Propel w/c distance in controlled environment: 158ft   Problem: RH Stairs Goal: LTG Patient will ambulate up and down stairs w/assist (PT) Description: LTG: Patient will ambulate up and down # of stairs with assistance (PT) Flowsheets (Taken 03/15/2021 1116) LTG: Pt will ambulate up/down stairs assist needed:: Supervision/Verbal cueing LTG: Pt will  ambulate up and down number of stairs: 3 steps with 1 rail support to access home   Problem: RH Awareness Goal: LTG: Patient will demonstrate awareness during functional activites type of (PT) Description: LTG: Patient will demonstrate awareness during functional activites type of (PT) Flowsheets (Taken 03/15/2021 1116) Patient will demonstrate awareness during functional activites type of: Emergent LTG: Patient will demonstrate awareness during functional activites type of (PT): Supervision   Problem: RH Pre-functional/Other (Specify) Goal: RH LTG PT (Specify) 1 Description:  RH LTG  PT (Specify) 1 Flowsheets (Taken 03/15/2021 1116) LTG: Other PT (Specify) 1: Pt will increased Berg balance score to >45/56 to indicate improved balance and fucntional mobility in the home.

## 2021-03-15 NOTE — Progress Notes (Signed)
Occupational Therapy Session Note  Patient Details  Name: Randall Sullivan MRN: [id number] Date of Birth: [birth year]  Today's Date: 03/15/2021 OT Individual Time: 9563-8756 OT Individual Time Calculation (min): 45 min    Short Term Goals: Week 1:  OT Short Term Goal 1 (Week 1): Pt will complete LB dressing with CGA and AE prn. OT Short Term Goal 2 (Week 1): Pt will complete grooming and oral care standing sink side with SUP. OT Short Term Goal 3 (Week 1): Pt will be able to manage all aspects of footwear with supervision and AE prn.  Skilled Therapeutic Interventions/Progress Updates:    Pt received sitting EOB with wife, dtr, and RN present, agreeable to OT session. RN removed IV from L arm. Pt and family discussion and education on POC, possible DME recommendations, and home safety/fall prevention strategies. Sit<>stand no AD close spvsn with unsteadiness on feet noted. Pt ambulated with no AD and CGA bed>sink with quick movements and unsteadiness/slight LOB despite vc's to slow pace. Pt provided with RW and returned to sitting EOB with improved balance and slower pace. Required demo and vc's for safe use of RW during functional ambulation and sit<>stand; pt demonstrated good understanding of safe use. Pt engaged in [facility] and visual perceptual tasks demonstrating R side inattention throughout. Pt demos uncoordinated RUE movements in FM tasks and difficulties with translation. Crossed through lines on paper with increased pressure and no vc's to cross through lines on R of paper. Completed 9 hole peg test as follows:  R: 1 min 25.8 sec L: 32.6 sec Pt engaged in visual perceptual task requiring copying model using peg board and colored pegs. Pt placed first peg correctly with no cues, but then demonstrated visual perceptual and cognitive difficulties including selecting correct colors, attempting to place pegs into holes on picture and not in the foam peg board, placing pegs in incorrect holes, and  forgetting where bin of pegs was located (on R side to facilitate scanning to R). Pt became frustrated with task stating it made him made he could not complete it; task terminated. Pt remained seated EOB, alarm set, call bell placed on L side, and all needs met.   Therapy Documentation Precautions:  Precautions Precautions: Fall Restrictions Weight Bearing Restrictions: No   Pain: Pain Assessment Pain Scale: 0-10 Pain Score: 0-No pain   Therapy/Group: Individual Therapy  Mellissa Kohut 03/15/2021, 4:39 PM

## 2021-03-16 LAB — GLUCOSE, CAPILLARY
Glucose-Capillary: 129 mg/dL — ABNORMAL HIGH (ref 70–99)
Glucose-Capillary: 204 mg/dL — ABNORMAL HIGH (ref 70–99)
Glucose-Capillary: 92 mg/dL (ref 70–99)
Glucose-Capillary: 141 mg/dL — ABNORMAL HIGH (ref 70–99)
Glucose-Capillary: 177 mg/dL — ABNORMAL HIGH (ref 70–99)

## 2021-03-16 MED ORDER — PRAVASTATIN SODIUM 10 MG PO TABS
20.0000 mg | ORAL_TABLET | Freq: Once | ORAL | Status: AC
  Administered 2021-03-16: 20 mg via ORAL
  Filled 2021-03-16: qty 2

## 2021-03-16 MED ORDER — PRAVASTATIN SODIUM 40 MG PO TABS
40.0000 mg | ORAL_TABLET | Freq: Every day | ORAL | Status: DC
  Administered 2021-03-17 – 2021-03-23 (×7): 40 mg via ORAL
  Filled 2021-03-16 (×7): qty 1

## 2021-03-16 MED ORDER — TRAZODONE HCL 50 MG PO TABS
25.0000 mg | ORAL_TABLET | Freq: Every day | ORAL | Status: DC
  Administered 2021-03-16 – 2021-03-22 (×7): 25 mg via ORAL
  Filled 2021-03-16 (×7): qty 1

## 2021-03-16 NOTE — Progress Notes (Addendum)
Speech Language Pathology Daily Session Note  Patient Details  Name: Randall Sullivan MRN: [id number] Date of Birth: November 07, 1950  Today's Date: 03/16/2021 SLP Individual Time: 1050-1130 SLP Individual Time Calculation (min): 40 min  Short Term Goals: Week 1: SLP Short Term Goal 1 (Week 1): Pt will name familiar objects with mod A verbal cues and 80% accuracy. SLP Short Term Goal 2 (Week 1): Pt will follow 2 step commands on 80% of opportunities with mod A verbal cues. SLP Short Term Goal 3 (Week 1): Pt will orient to date, day of the week, and month with min a verbal cues and visual aid. SLP Short Term Goal 4 (Week 1): Pt will demonstrate emergent awareness of deficits with min A verbal cues.  Skilled Therapeutic Interventions:   Patient seen with wife present for skilled ST session focused on aphasia goals. Patient able to answer basic biographical questions without difficulty and was oriented to month, stated correct year, but was not able to give correct year, "2099". He initially required modA cues for divergent naming task but this improved to minA during second and third trials. Patient did not exhibit awareness to repeating items he had already listed or when perseverating on a particular object name. During object photo naming task, he had significant difficulty, with semantic and phonemic errors but no awareness; ie: correctly named "hamburger" but then next item (pink balloon), named as "hotdog, yellow".  Patient would ask if things would get better and did demonstrate some awareness to language deficits overall. He continues to benefit from skilled SLP intervention to maximize cognitive-linguistic and speech function prior to discharge.  Pain Pain Assessment Pain Scale: 0-10 Pain Score: 0-No pain Faces Pain Scale: No hurt  Therapy/Group: Individual Therapy   Sonia Baller, MA, CCC-SLP Speech Therapy

## 2021-03-16 NOTE — IPOC Note (Signed)
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 [facility]) Active Problems:   Right thalamic infarction [facility])     Functional Problem List: Nursing Bowel, Pain, Endurance, Medication Management, Safety  PT Balance, Safety, Behavior, Sensory, Endurance, Motor, Perception  OT Balance, Cognition, Safety, Endurance, Motor  SLP Cognition, Linguistic, Safety  TR         Basic ADL's: OT Grooming, Bathing, Dressing, Toileting     Advanced  ADL's: OT Simple Meal Preparation, Light Housekeeping     Transfers: PT Bed Mobility, Bed to Chair, Car, [employer], Floor  OT Tub/Shower, Agricultural engineer: PT Ambulation, Stairs, Wheelchair Mobility     Additional Impairments: OT None  SLP Communication comprehension, expression    TR      Anticipated Outcomes Item Anticipated Outcome  Self Feeding independent  Swallowing      Basic self-care  SUP  Toileting  SUP   Bathroom Transfers SUP  Bowel/Bladder  manage bowel with mod I assist  Transfers  Supervision assist with LRAD  Locomotion  Ambulatory at [place] assist level a with LRAD  Communication  min A expression Supervision for rec langauge.  Cognition  Supervision A  Pain  at or belowl level 4  Safety/Judgment  maintain safety with cues/reminders   Therapy Plan: PT Intensity: Minimum of 1-2 x/day ,45 to 90 minutes PT Frequency: 5 out of 7 days PT Duration Estimated Length of Stay: 7-10 days OT Intensity: Minimum of 1-2 x/day, 45 to 90 minutes OT Frequency: 5 out of 7 days OT Duration/Estimated Length of Stay: 7-10 days SLP Intensity: Minumum of 1-2 x/day, 30 to 90 minutes SLP Frequency: 3 to 5 out of 7 days SLP Duration/Estimated Length of Stay: 7-10 days   Due to the  current state of emergency, patients may not be receiving their 3-hours of Medicare-mandated therapy.   Team Interventions: Nursing Interventions Patient/Family Education, Bowel Management, Pain Management, Medication Management, Disease Management/Prevention, Discharge Planning  PT interventions Ambulation/gait training, Community reintegration, DME/adaptive equipment instruction, Psychosocial support, Neuromuscular re-education, Stair training, UE/LE Strength taining/ROM, Wheelchair propulsion/positioning, Training and development officer, Discharge planning, Functional electrical stimulation, Pain management, Skin care/wound management, UE/LE Coordination activities, Therapeutic Activities, Cognitive remediation/compensation, Disease management/prevention, Functional mobility training, Patient/family education, Visual/perceptual remediation/compensation, Therapeutic Exercise, Splinting/orthotics  OT Interventions Balance/vestibular training, Cognitive remediation/compensation, Discharge planning, DME/adaptive equipment instruction, Functional mobility training, Neuromuscular re-education, Pain management, Patient/family education, Self Care/advanced ADL retraining, Therapeutic Activities, Skin care/wound managment, Therapeutic Exercise, UE/LE Coordination activities, Visual/perceptual remediation/compensation, UE/LE Strength taining/ROM  SLP Interventions Cognitive remediation/compensation, Speech/Language facilitation, Therapeutic Activities, Patient/family education  TR Interventions    SW/CM Interventions Discharge Planning, Psychosocial Support, Patient/Family Education, Disease Management/Prevention   Barriers to Discharge MD  Medical stability  Nursing Decreased caregiver support, Home environment access/layout, New diabetic 1 level3st bil rails with o2@2l /min [place] with wife  PT Inaccessible home environment, Home environment access/layout    OT Decreased caregiver support limited caregiver  support, wife works  SLP      SW       Team Discharge Planning: Destination: PT-Home ,OT- Home , SLP-Home Projected Follow-up: PT-Home health PT, [place], 24 hour supervision/assistance, SLP-Home Health SLP Projected Equipment Needs: PT-To be determined, Rolling walker with 5" wheels, OT- 3 in 1 bedside comode,  Tub/shower bench, To be determined, SLP-None recommended by SLP Equipment Details: PT- , OT-  Patient/family involved in discharge planning: PT- Patient,  OT-Patient, SLP-Patient, Family member/caregiver  MD ELOS: 7-10 days Medical Rehab Prognosis:  Excellent Assessment: The patient has been admitted for CIR therapies with the diagnosis of left PCA infarct with right hp and word finding deficits. The team will be addressing functional mobility, strength, stamina, balance, safety, adaptive techniques and equipment, self-care, bowel and bladder mgt, patient and caregiver education, NMR, visual-spatial rx, cognition and communication. Goals have been set at supervision with mobility and self-care, min assist to supervision for cognition and communication.   Due to the current state of emergency, patients may not be receiving their 3 hours per day of Medicare-mandated therapy.    Meredith Staggers, MD, FAAPMR     See Team Conference Notes for weekly updates to the plan of care

## 2021-03-16 NOTE — Progress Notes (Signed)
PROGRESS NOTE   Subjective/Complaints: Slept better but felt a little hung over this morning from trazodone. Breathing near baseline  ROS: Patient denies fever, rash, sore throat, blurred vision, nausea, vomiting, diarrhea, cough,   chest pain, joint or back pain, headache, or mood change. .    Objective:   No results found. Recent Labs    03/14/21 1400 03/15/21 0500  WBC 9.7 8.8  HGB 15.8 14.5  HCT 47.8 44.6  PLT 256 233   Recent Labs    03/14/21 0859 03/14/21 1400 03/15/21 0500  NA 134*  --  137  K 3.6  --  3.6  CL 95*  --  96*  CO2 29  --  31  GLUCOSE 231*  --  129*  BUN 24*  --  23  CREATININE 1.25* 1.29* 1.45*  CALCIUM 9.2  --  9.1    Intake/Output Summary (Last 24 hours) at 03/16/2021 1148 Last data filed at 03/16/2021 0728 Gross per 24 hour  Intake 460 ml  Output 325 ml  Net 135 ml        Physical Exam: Vital Signs Blood pressure 102/67, pulse 78, temperature 98.7 F (37.1 C), temperature source Oral, resp. rate 20, height 5\' 10"  (1.778 m), SpO2 99 %.  Constitutional: No distress . Vital signs reviewed. HEENT: EOMI, oral membranes moist Neck: supple Cardiovascular: RRR without murmur. No JVD    Respiratory/Chest: CTA Bilaterally without wheezes or rales. Normal effort, O2 [place]    GI/Abdomen: BS +, non-tender, non-distended Ext: no clubbing, cyanosis, or edema Psych: pleasant and cooperative Skin: Clean and intact without signs of breakdown Neuro: reasonable insight and awareness. Right HH, delays in naming objects. Able to convey thoughts, ideas however. RUE 4/5, RLE 4- to 4/5. LUE and LLE 4+ to 5/5.--motor scores stable.  No sensory findings.   Musculoskeletal: no pain in limbs/trunk   Assessment/Plan: 1. Functional deficits which require 3+ hours per day of interdisciplinary therapy in a comprehensive inpatient rehab setting. Physiatrist is providing close team supervision and 24 hour  management of active medical problems listed below. Physiatrist and rehab team continue to assess barriers to discharge/monitor patient progress toward functional and medical goals  Care Tool:  Bathing    Body parts bathed by patient: Right arm, Left arm, Abdomen, Chest, Front perineal area, Buttocks, Right upper leg, Left upper leg, Left lower leg, Right lower leg, Face         Bathing assist Assist Level: Contact Guard/Touching assist     Upper Body Dressing/Undressing Upper body dressing   What is the patient wearing?: Pull over shirt    Upper body assist Assist Level: Supervision/Verbal cueing    Lower Body Dressing/Undressing Lower body dressing      What is the patient wearing?: Pants     Lower body assist Assist for lower body dressing: Moderate Assistance - Patient 50 - 74%     Toileting Toileting    Toileting assist Assist for toileting: Minimal Assistance - Patient > 75%     Transfers Chair/bed transfer  Transfers assist     Chair/bed transfer assist level: Contact Guard/Touching assist     Locomotion Ambulation   Ambulation assist  Assist level: Moderate Assistance - Patient 50 - 74% Assistive device: Hand held assist Max distance: 50   Walk 10 feet activity   Assist     Assist level: Moderate Assistance - Patient - 50 - 74%     Walk 50 feet activity   Assist    Assist level: Moderate Assistance - Patient - 50 - 74%      Walk 150 feet activity   Assist Walk 150 feet activity did not occur: Safety/medical concerns         Walk 10 feet on uneven surface  activity   Assist Walk 10 feet on uneven surfaces activity did not occur: Safety/medical concerns         Wheelchair     Assist Will patient use wheelchair at discharge?: No      Wheelchair assist level: Minimal Assistance - Patient > 75%      Wheelchair 50 feet with 2 turns activity    Assist        Assist Level: Minimal Assistance -  Patient > 75%   Wheelchair 150 feet activity     Assist      Assist Level: Minimal Assistance - Patient > 75%   Blood pressure 102/67, pulse 78, temperature 98.7 F (37.1 C), temperature source Oral, resp. rate 20, height 5\' 10"  (1.778 m), SpO2 99 %.  Medical Problem List and Plan: 1.  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             -patient may  shower             -ELOS/Goals: 12-14d, Supervision ADL and Mobility   -Continue CIR therapies including PT, OT, and SLP   2.  Antithrombotics: -DVT/anticoagulation: Lovenox             -antiplatelet therapy: Aspirin 325 mg daily and transition to ELIQUIS 5 mg BID 03/19/2021 3. Pain Management: Fioricet as needed headache. controlled 4. Mood/sleep: Melatonin 6 mg nightly  6/16 added trazodone at 9pm scheduled  6/17--reduce to 25mg  qhs given hangover effect at 50mg  dose             -antipsychotic agents: N/A 5. Neuropsych: This patient is not capable of making decisions on his own behalf. 6. Skin/Wound Care: Routine skin checks 7. Fluids/Electrolytes/Nutrition: encouarge po  -I personally reviewed the patient's labs today. -BUN/Cr remain elevated--continue to encourage fluids   -recheck labs Monday 8.  PAF.  Toprol-XL 50 mg daily, Cardizem 120 mg twice daily.  Cardiac rate remains controlled 9.  Hyperlipidemia.  Fenofibrate 160 mg daily/Pravachol 10.  History of lung cancer/COPD.  Status post VATS.  Follow-up outpatient.  Continue chronic oxygen as prior to admission 11.  Tobacco abuse.  Counseling 12.  New onset diabetes mellitus.  Hemoglobin A1c 7.5.  SSI.  Consider metformin.  CBG (last 3)  Recent Labs    03/15/21 2112 03/16/21 0555 03/16/21 1131  GLUCAP 77 129* 204*    6/16 -added amaryl 1mg  daily to start given reading and A1C  -observe for response 13.  Obesity.  BMI 31.63.  Dietary follow-up 14. Prerenal azotemia,  mild baseline CKD?  -see #7    LOS: 2 days A FACE TO FACE EVALUATION WAS PERFORMED  Meredith Staggers 03/16/2021, 11:48 AM

## 2021-03-16 NOTE — Progress Notes (Signed)
Physical Therapy Session Note  Patient Details  Name: Shawna Kiener MRN: [id number] Date of Birth: 07/04/1951  Today's Date: 03/16/2021 PT Individual Time: 1300-1330 PT Individual Time Calculation (min): 30 min   Short Term Goals: Week 1:  PT Short Term Goal 1 (Week 1): STG=LTG due to ELOS  Skilled Therapeutic Interventions/Progress Updates:    Patient in w/c and reports no pain.  States tired from earlier sessions.  Wife in the room and reports his memory still not good.  Re-orients him to time for therapy not to lay down.  Patient assisted in w/c to dayroom and maintained on 2L O2 throughout session spot checking SpO2 93-97% throughout, but pt with c/o SOB and end of session RN informed possible need for breathing rx.  Patient sit to stand with CGA and ambulated with RW and CGA through obstacle course stepping around and over obstacles with mod cues for technique, and assist to clear cones on R at times.  Standing at 4" step completed alternate taps to step with bilateral UE support.  Then standing with 1 foot on step and no UE support x 30 sec trials x 1 on each foot with min A for R lateral LOB with L foot on step.  Patient performed step ups onto step x 5 with each foot with bilateral UE support.  PAtient ambulated to room x 130' with RW and min to CGA.  Patient sit to supine with S and cues for positioning.  Left with wife in the room, call bell in reach and bed alarm set.   Therapy Documentation Precautions:  Precautions Precautions: Fall Restrictions Weight Bearing Restrictions: No  Pain: Pain Assessment Pain Score: 0-No pain     Therapy/Group: Individual Therapy  Reginia Naas Doland, PT 03/16/2021, 1:22 PM

## 2021-03-16 NOTE — Progress Notes (Signed)
Physical Therapy Session Note  Patient Details  Name: Randall Sullivan MRN: [id number] Date of Birth: [birth year]  Today's Date: 03/16/2021 PT Individual Time: 0905-1017 PT Individual Time Calculation (min): 72 min   Short Term Goals: Week 1:  PT Short Term Goal 1 (Week 1): STG=LTG due to ELOS   Skilled Therapeutic Interventions/Progress Updates:   Pt received supine in bed and agreeable to PT. Supine>sit transfer with supervision assist and cues for awareness of EOB and rails on the R. Stand pivot transfer to [facility] with min assist and no AD. Stand pivot transfer to toilet from [facility] with min assist. Pt urinated in standing with CGA. Hand hygiene with CGA while standing at sink.   Pt transported to ortho gym in [facility]. BITS visual scanning. User paced x 3 bouts. Sequencing x 2 bouts; attempted Letter but able to identify letters due to aphasia. 1 UE support on RW throughout and CGA-supervision assist to maintain balance and max cues for visual scanning to the R to locate all stimuli.   Pt transported to day room in [facility]. Problem solving to perform low difficulty peg board puzzle of alternating pattern Red/blue and stacked red/blue. Moderate cues for visual scanning R and attention to example to improve awareness of pattern and error detection.   Gait training with RW x 32ft with CGA and cues for safety in turns. Gait training then performed without AD x 28ft with min assist and to step over bean bags on floor 2 x 10 with min-mod assist with occasional posterior/lateral LOB due to ataxia on the RLE when stepping over obstacles. Cues for improved hip flexion and decreased speed to reduce fall risk.   Patient returned to room and left sitting EOB with call bell in reach and all needs met.    Pt on 2L/min of O2 through session with no complaints of SOB.         Therapy Documentation Precautions:  Precautions Precautions: Fall Restrictions Weight Bearing Restrictions: No  Vital Signs: Oxygen  Therapy SpO2: 99 % O2 Device: Nasal Cannula O2 Flow Rate (L/min): 2 L/min Pain: Pain Assessment Pain Scale: 0-10 Pain Score: 0-No pain   Therapy/Group: Individual Therapy  Lorie Phenix 03/16/2021, 11:27 AM

## 2021-03-16 NOTE — Progress Notes (Signed)
Occupational Therapy Session Note  Patient Details  Name: Noell Shular MRN: [id number] Date of Birth: September 09, 1951  Today's Date: 03/16/2021 OT Individual Time: 0165-5374 OT Individual Time Calculation (min): 38 min    Short Term Goals: Week 1:  OT Short Term Goal 1 (Week 1): Pt will complete LB dressing with CGA and AE prn. OT Short Term Goal 2 (Week 1): Pt will complete grooming and oral care standing sink side with SUP. OT Short Term Goal 3 (Week 1): Pt will be able to manage all aspects of footwear with supervision and AE prn.  Skilled Therapeutic Interventions/Progress Updates:    Pt greeted seated in wc and agreeable to OT treatment session. Pt declined any BADL tasks at this time. Pt  on 2L throughout session. Pt brought down to therapy gym in wc. Worked on standing balance/endurance and memory with BITS activity. Practiced 2-3 words to remember, but pt unable to read words. OT graded task by OT reading words out toloud, but he was unable to locate pictures. Worked on visual field cut with overt head turns to locate dots on R side of screen. UB there-ex with 5  minutes on [employer] arm bike on level 3. Pt with questions regarding recover and estimated length of stay. OT educate don OT goals and plan of care. Pt returned to room and left seated in wc with alarm belt on, call bell in reach, spouse present, and lunch tray set-up.  Therapy Documentation Precautions:  Precautions Precautions: Fall Restrictions Weight Bearing Restrictions: No Pain: Denies pain   Therapy/Group: Individual Therapy  Valma Cava 03/16/2021, 12:15 PM

## 2021-03-16 NOTE — Progress Notes (Signed)
LDL 76. Dc summary stated to increase pravachol to 40mg  due to CVA. D/w [place] and will increase dose on CIR.  Onnie Boer, PharmD, BCIDP, AAHIVP, CPP Infectious Disease Pharmacist 03/16/2021 10:05 AM

## 2021-03-17 LAB — GLUCOSE, CAPILLARY
Glucose-Capillary: 129 mg/dL — ABNORMAL HIGH (ref 70–99)
Glucose-Capillary: 91 mg/dL (ref 70–99)
Glucose-Capillary: 92 mg/dL (ref 70–99)
Glucose-Capillary: 121 mg/dL — ABNORMAL HIGH (ref 70–99)

## 2021-03-17 MED ORDER — POLYETHYLENE GLYCOL 3350 17 G PO PACK
17.0000 g | PACK | Freq: Every day | ORAL | Status: DC | PRN
  Administered 2021-03-17: 17 g via ORAL
  Filled 2021-03-17: qty 1

## 2021-03-17 MED ORDER — BISACODYL 10 MG RE SUPP: 10.0000 mg | Freq: Every day | RECTAL | Status: DC | PRN

## 2021-03-17 NOTE — Progress Notes (Signed)
Occupational Therapy Session Note  Patient Details  Name: Randall Sullivan MRN: [id number] Date of Birth: 11/26/1950  Today's Date: 03/17/2021 OT Individual Time: 1300-1345 OT Individual Time Calculation (min): 45 min    Short Term Goals: Week 1:  OT Short Term Goal 1 (Week 1): Pt will complete LB dressing with CGA and AE prn. OT Short Term Goal 2 (Week 1): Pt will complete grooming and oral care standing sink side with SUP. OT Short Term Goal 3 (Week 1): Pt will be able to manage all aspects of footwear with supervision and AE prn.   Skilled Therapeutic Interventions/Progress Updates:    Pt sitting EOB with wife sitting at bedside.  Pt asking questions about difficulty remembering names of streets that used to be familiar to him.  OT educated pt and wife regarding location of stroke and resulting deficits including aphasia as well as memory.  Also educated pt on compensatory strategies for memory difficulties including ways in which pts wife can provide cues to assist pt in remembering desired information.  Pt requesting to use the bathroom and shower/change clothes.  Pt on 2 L O2 via nasal cannula throughout session.  Notified nurse of pts request for breathing treatment during session.  SPO2 remained above 94% throughout.  Pt completed all functional mobility using RW with close supervision.  Pt ambulated from EOB to bathroom and stood at toilet and urinated with close supervision. Completed shower bench sit<>stand transfer using grab bars.  Pt completed UB/LB dressing with CGA.  UB bathing with supervision and LB bathing with CGA.  Pt returned to EOB and then requesting urgent use of urinal.  Pt stood to urinate with close supervision and max assist to position urinal.  Stand to sit at recliner with CGA.  Call bell in reach, seat alarm on.  Therapy Documentation Precautions:  Precautions Precautions: Fall Restrictions Weight Bearing Restrictions: No    Therapy/Group: Individual  Therapy  Ezekiel Slocumb 03/17/2021, 4:55 PM

## 2021-03-17 NOTE — Progress Notes (Signed)
Occupational Therapy Session Note  Patient Details  Name: Randall Sullivan MRN: [id number] Date of Birth: Feb 15, 1951  Today's Date: 03/17/2021 OT Individual Time: 1000-1058 OT Individual Time Calculation (min): 58 min    Short Term Goals: Week 1:  OT Short Term Goal 1 (Week 1): Pt will complete LB dressing with CGA and AE prn. OT Short Term Goal 2 (Week 1): Pt will complete grooming and oral care standing sink side with SUP. OT Short Term Goal 3 (Week 1): Pt will be able to manage all aspects of footwear with supervision and AE prn.  Skilled Therapeutic Interventions/Progress Updates:     Pt received in w/c with no report of pain on RA as pt self selected to remove [place] and O2 98%.    Therapeutic activity Pt remains on RA throughotu session with O2 >95%. Pt stands on compliant wedge with CGA fading to S with tacile cuing at R knee for extension and VC for weight shifting R for midline orientation while playing checkers with no rest breaks standing >25 min at tabletop and vertical checker board placed R of midline for R attention/visual scanning. Pt requried forced use of RUE for all plays. Pt demo mild ataxia, but able to move magnetic pieces. Pt completes 9HPT RUE 1 min 31 sec and LUE 44 seconds.  Pt left at end of session in bed with exit alarm on, call light in reach and all needs met   Therapy Documentation Precautions:  Precautions Precautions: Fall Restrictions Weight Bearing Restrictions: No General:   Vital Signs: Therapy Vitals Temp: 99 F (37.2 C) Temp Source: Oral Pulse Rate: 79 Resp: 16 BP: 117/82 Patient Position (if appropriate): Lying Oxygen Therapy SpO2: 98 % O2 Device: Room Air Pain: Pain Assessment Pain Scale: 0-10 Pain Score: 0-No pain ADL: ADL Eating: Set up Grooming: Supervision/safety Where Assessed-Grooming: Sitting at sink Upper Body Bathing: Supervision/safety Where Assessed-Upper Body Bathing: Shower Lower Body Bathing: Minimal  assistance Where Assessed-Lower Body Bathing: Shower Upper Body Dressing: Supervision/safety Where Assessed-Upper Body Dressing: Chair Lower Body Dressing: Moderate assistance Where Assessed-Lower Body Dressing: Chair Toileting: Minimal assistance Where Assessed-Toileting: Glass blower/designer: Therapist, music Method: Ambulating (with RW) Science writer: Energy manager: Not assessed Social research officer, government: Contact guard, Moderate cueing Social research officer, government Method: Heritage manager: Grab bars, Nurse, learning disability    Praxis   Exercises:   Other Treatments:     Therapy/Group: Individual Therapy  Tonny Branch 03/17/2021, 6:50 AM

## 2021-03-17 NOTE — Progress Notes (Signed)
Physical Therapy Session Note  Patient Details  Name: Randall Sullivan MRN: [id number] Date of Birth: 1951/03/21  Today's Date: 03/17/2021 PT Individual Time: 0800-0908 PT Individual Time Calculation (min): 68 min   Short Term Goals: Week 1:  PT Short Term Goal 1 (Week 1): STG=LTG due to ELOS  Skilled Therapeutic Interventions/Progress Updates:    Pt supine in bed, eager to start therapy, and states no pain. Pt requests to go to the bathroom before going to the therapy gym.   Supine to sit transition with set up assist and HOB elevated to 30 degrees. Verbal cues for using RUE to push for terminal transition of trunk. Sit<>stands CGA with verbal cue for hand placement and proper equipment management with RW throughout session.   Gait training with RW 15 ft x2 from bed to toilet back to wheelchair, 70f with turn towards the right 1/2 way while weaving through cones x3, 334fwith turn towards right 1/2 way while stepping over obstacles. All gait training with RW, MinA and multi-modal cues for scanning right side of environment. 3 Instances of manual assistance and verbal cues to pause and attend to the right to prevent catching RLE on obstacles.   Cones placed randomly throughout the gym to work on dynamic balance with reaching, coordination, environmental scanning and navigation, and problem solving. BUE support with RW and MinA for balance with reaching and verbal cuing to facilitate attending to the right for 75% of cones placed on the right side.   Attempted O2 titration with activity. Pt able to maintain 95%+ while on 1L with movement, 98%+ on 1L resting, pt desat to 88% on no supplemental O2 with activity. Nasal canula put back on 1L with pt quickly returning to 98%.   Kickball tossed on rebounder used to target balance and coordination x3 of 30seconds. Min A for steadying catching>throwing. Pt demonstrated small stepping strategy when necessary to catch the ball with minimal manual assistance  from PT to prevent LOB and maintain safety.   Pt sitting in wc on left side of bed to encourage scanning to the right for TV at the end of therapy with safety belt and call bell within reach. Wife in room, all needs met at this time.    Therapy Documentation Precautions:  Precautions Precautions: Fall Restrictions Weight Bearing Restrictions: No General:    Pain:      Therapy/Group: Individual Therapy  Philana Younis, SPT  03/17/2021, 2:22 PM

## 2021-03-17 NOTE — Plan of Care (Signed)
  Problem: RH BOWEL ELIMINATION Goal: RH STG MANAGE BOWEL WITH ASSISTANCE Description: STG Manage Bowel with mod I Assistance. Outcome: Not Progressing

## 2021-03-17 NOTE — Progress Notes (Signed)
Physical Therapy Session Note  Patient Details  Name: Randall Sullivan MRN: [id number] Date of Birth: [birth year]  Today's Date: 03/17/2021 PT Individual Time: 5956-3875 PT Individual Time Calculation (min): 44 min   Short Term Goals: Week 1:  PT Short Term Goal 1 (Week 1): STG=LTG due to ELOS  Skilled Therapeutic Interventions/Progress Updates:   Pt received supine in bed and agreeable to PT. Supine>sit transfer with min assist on the L side of the bed with cues for awareness of EOB to prevent LOB. Stand pivot transfer to [facility] with UE support on WC arm rest and min assist. Pt transported to rehab gym in [facility].   Dynamic balance training while engaged in wii fit table tilt x 2 and penguin slide x 2 with min-mod assist from PT to improve use of ankle strategy to control COM. Pt progressed from BUE support on RW and no AD. Noted to have increased trunkal ataxia without UE support limiting weight shifting to the R>L.  Lateral reach to obtain beanbag and throw to target 0-1 UE support on RW with min assist-mod assist due to excessive anterior lean and trunkal ataxia with toss; performed 2 x 10 with RUE.   Patient returned to room and left sitting in [facility] with call bell in reach and all needs met.           Therapy Documentation Precautions:  Precautions Precautions: Fall Restrictions Weight Bearing Restrictions: No  Vital Signs: Therapy Vitals Temp: 99 F (37.2 C) Temp Source: Oral Pulse Rate: 77 Resp: 18 BP: 124/82 Patient Position (if appropriate): Lying Oxygen Therapy SpO2: 98 % O2 Device: Nasal Cannula O2 Flow Rate (L/min): 2 L/min Pain: denies    Therapy/Group: Individual Therapy  Lorie Phenix 03/17/2021, 5:08 PM

## 2021-03-18 DIAGNOSIS — I639 Cerebral infarction, unspecified: Secondary | ICD-10-CM

## 2021-03-18 LAB — GLUCOSE, CAPILLARY
Glucose-Capillary: 116 mg/dL — ABNORMAL HIGH (ref 70–99)
Glucose-Capillary: 114 mg/dL — ABNORMAL HIGH (ref 70–99)
Glucose-Capillary: 143 mg/dL — ABNORMAL HIGH (ref 70–99)
Glucose-Capillary: 139 mg/dL — ABNORMAL HIGH (ref 70–99)

## 2021-03-18 MED ORDER — APIXABAN 5 MG PO TABS
5.0000 mg | ORAL_TABLET | Freq: Two times a day (BID) | ORAL | Status: DC
  Administered 2021-03-19 – 2021-03-20 (×3): 5 mg via ORAL
  Filled 2021-03-18 (×3): qty 1

## 2021-03-18 NOTE — Progress Notes (Signed)
PROGRESS NOTE   Subjective/Complaints: Occasional jerking movement right upper extremity but not right lower extremity no facial twitching.  Improves with repositioning Patient denies any breathing problems last night.  He occasionally wears oxygen at home but not usually during the day.  The patient had no difficulty in therapy yesterday with his breathing. Wife and patient concerned about cost of Eliquis.  Message from neurology, Dr. Erlinda Hong, okay to resume warfarin, had been on this prestroke.  We discussed pros and cons of Eliquis versus warfarin, for this patient cost is a major issue.  He has not had dietary education in the past for warfarin. Patient also continues have field cut on the right side, we discussed no driving post discharge ROS: Patient denies CP, SOB, N/V/D Objective:   No results found. No results for input(s): WBC, HGB, HCT, PLT in the last 72 hours.  No results for input(s): NA, K, CL, CO2, GLUCOSE, BUN, CREATININE, CALCIUM in the last 72 hours.   Intake/Output Summary (Last 24 hours) at 03/18/2021 0759 Last data filed at 03/18/2021 0615 Gross per 24 hour  Intake 480 ml  Output 775 ml  Net -295 ml         Physical Exam: Vital Signs Blood pressure 109/73, pulse 73, temperature 98.7 F (37.1 C), temperature source Oral, resp. rate 18, height 5\' 10"  (1.778 m), SpO2 98 %.   General: No acute distress Mood and affect are appropriate Heart: Regular rate and rhythm no rubs murmurs or extra sounds Lungs: Clear to auscultation, breathing unlabored, no rales or wheezes Abdomen: Positive bowel sounds, soft nontender to palpation, nondistended Extremities: No clubbing, cyanosis, or edema Skin: No evidence of breakdown, no evidence of rash Neuro: reasonable insight and awareness. Right HH, delays in naming objects. RUE 4/5, RLE 4- to 4/5. LUE and LLE 4+ to 5/5.--motor scores stable.  No sensory findings.    Musculoskeletal: no pain in limbs/trunk Tremor right upper extremity intermittent and positional.  No evidence of spasticity  Assessment/Plan: 1. Functional deficits which require 3+ hours per day of interdisciplinary therapy in a comprehensive inpatient rehab setting. Physiatrist is providing close team supervision and 24 hour management of active medical problems listed below. Physiatrist and rehab team continue to assess barriers to discharge/monitor patient progress toward functional and medical goals  Care Tool:  Bathing    Body parts bathed by patient: Right arm, Left arm, Abdomen, Chest, Front perineal area, Buttocks, Right upper leg, Left upper leg, Left lower leg, Right lower leg, Face         Bathing assist Assist Level: Contact Guard/Touching assist     Upper Body Dressing/Undressing Upper body dressing   What is the patient wearing?: Pull over shirt    Upper body assist Assist Level: Supervision/Verbal cueing    Lower Body Dressing/Undressing Lower body dressing      What is the patient wearing?: Pants     Lower body assist Assist for lower body dressing: Moderate Assistance - Patient 50 - 74%     Toileting Toileting    Toileting assist Assist for toileting: Minimal Assistance - Patient > 75%     Transfers Chair/bed transfer  Transfers assist  Chair/bed transfer assist level: Contact Guard/Touching assist     Locomotion Ambulation   Ambulation assist      Assist level: Moderate Assistance - Patient 50 - 74% Assistive device: Hand held assist Max distance: 50   Walk 10 feet activity   Assist     Assist level: Moderate Assistance - Patient - 50 - 74%     Walk 50 feet activity   Assist    Assist level: Moderate Assistance - Patient - 50 - 74%      Walk 150 feet activity   Assist Walk 150 feet activity did not occur: Safety/medical concerns         Walk 10 feet on uneven surface  activity   Assist Walk 10 feet  on uneven surfaces activity did not occur: Safety/medical concerns         Wheelchair     Assist Will patient use wheelchair at discharge?: No      Wheelchair assist level: Minimal Assistance - Patient > 75%      Wheelchair 50 feet with 2 turns activity    Assist        Assist Level: Minimal Assistance - Patient > 75%   Wheelchair 150 feet activity     Assist      Assist Level: Minimal Assistance - Patient > 75%   Blood pressure 109/73, pulse 73, temperature 98.7 F (37.1 C), temperature source Oral, resp. rate 18, height 5\' 10"  (1.778 m), SpO2 98 %.  Medical Problem List and Plan: 1.  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             -patient may  shower             -ELOS/Goals: 12-14d, Supervision ADL and Mobility   -Continue CIR therapies including PT, OT, and SLP   2.  Antithrombotics: -DVT/anticoagulation: Lovenox             -antiplatelet therapy: Aspirin 325 mg daily, plan was transition to ELIQUIS 5 mg BID 03/19/2021 however cost is an issue per neuro okay to resume warfarin  Discussed with pharmacy who is placing TOC consult to see if any assistance can be given to offset cost. 3. Pain Management: Fioricet as needed headache. controlled 4. Mood/sleep: Melatonin 6 mg nightly  6/16 added trazodone at 9pm scheduled  6/17--reduce to 25mg  qhs given hangover effect at 50mg  dose             -antipsychotic agents: N/A 5. Neuropsych: This patient is not capable of making decisions on his own behalf. 6. Skin/Wound Care: Routine skin checks 7. Fluids/Electrolytes/Nutrition: encouarge po  -I personally reviewed the patient's labs today. -BUN/Cr remain elevated--continue to encourage fluids   -recheck labs Monday 8.  PAF.  Toprol-XL 50 mg daily, Cardizem 120 mg twice daily.  Cardiac rate remains controlled As per neurology okay to resume warfarin 9.   Hyperlipidemia.  Fenofibrate 160 mg daily/Pravachol 10.  History of lung cancer/COPD.  Status post VATS.  Follow-up outpatient.  Continue chronic oxygen as prior to admission 11.  Tobacco abuse.  Counseling 12.  New onset diabetes mellitus.  Hemoglobin A1c 7.5.  SSI.  Consider metformin.  CBG (last 3)  Recent Labs    03/17/21 1605 03/17/21 2020 03/18/21 0602  GLUCAP 92 121* 116*     6/19 well controlled  13.  Obesity.  BMI 31.63.  Dietary follow-up 14. Prerenal azotemia,  mild baseline CKD?  -see #7    LOS: 4 days A FACE TO FACE EVALUATION WAS PERFORMED  Charlett Blake 03/18/2021, 7:59 AM

## 2021-03-18 NOTE — Progress Notes (Signed)
PROGRESS NOTE   Subjective/Complaints:  Patient denies any breathing problems last night.  He occasionally wears oxygen at home but not usually during the day.  The patient had no difficulty in therapy yesterday with his breathing. Wife and patient concerned about cost of Eliquis.  Message from neurology, Dr. Erlinda Hong, okay to resume warfarin, had been on this prestroke.  We discussed pros and cons of Eliquis versus warfarin, for this patient cost is a major issue.  He has not had dietary education in the past for warfarin. Patient also continues have field cut on the right side, we discussed no driving post discharge ROS: Patient denies CP, SOB, N/V/D Objective:   No results found. No results for input(s): WBC, HGB, HCT, PLT in the last 72 hours.  No results for input(s): NA, K, CL, CO2, GLUCOSE, BUN, CREATININE, CALCIUM in the last 72 hours.   Intake/Output Summary (Last 24 hours) at 03/18/2021 0646 Last data filed at 03/18/2021 0615 Gross per 24 hour  Intake 480 ml  Output 925 ml  Net -445 ml         Physical Exam: Vital Signs Blood pressure 109/73, pulse 73, temperature 98.7 F (37.1 C), temperature source Oral, resp. rate 18, height 5\' 10"  (1.778 m), SpO2 98 %.   General: No acute distress Mood and affect are appropriate Heart: Regular rate and rhythm no rubs murmurs or extra sounds Lungs: Clear to auscultation, breathing unlabored, no rales or wheezes Abdomen: Positive bowel sounds, soft nontender to palpation, nondistended Extremities: No clubbing, cyanosis, or edema Skin: No evidence of breakdown, no evidence of rash Neuro: reasonable insight and awareness. Right HH, delays in naming objects. RUE 4/5, RLE 4- to 4/5. LUE and LLE 4+ to 5/5.--motor scores stable.  No sensory findings.   Musculoskeletal: no pain in limbs/trunk   Assessment/Plan: 1. Functional deficits which require 3+ hours per day of interdisciplinary  therapy in a comprehensive inpatient rehab setting. Physiatrist is providing close team supervision and 24 hour management of active medical problems listed below. Physiatrist and rehab team continue to assess barriers to discharge/monitor patient progress toward functional and medical goals  Care Tool:  Bathing    Body parts bathed by patient: Right arm, Left arm, Abdomen, Chest, Front perineal area, Buttocks, Right upper leg, Left upper leg, Left lower leg, Right lower leg, Face         Bathing assist Assist Level: Contact Guard/Touching assist     Upper Body Dressing/Undressing Upper body dressing   What is the patient wearing?: Pull over shirt    Upper body assist Assist Level: Supervision/Verbal cueing    Lower Body Dressing/Undressing Lower body dressing      What is the patient wearing?: Pants     Lower body assist Assist for lower body dressing: Moderate Assistance - Patient 50 - 74%     Toileting Toileting    Toileting assist Assist for toileting: Minimal Assistance - Patient > 75%     Transfers Chair/bed transfer  Transfers assist     Chair/bed transfer assist level: Contact Guard/Touching assist     Locomotion Ambulation   Ambulation assist      Assist level: Moderate Assistance -  Patient 50 - 74% Assistive device: Hand held assist Max distance: 50   Walk 10 feet activity   Assist     Assist level: Moderate Assistance - Patient - 50 - 74%     Walk 50 feet activity   Assist    Assist level: Moderate Assistance - Patient - 50 - 74%      Walk 150 feet activity   Assist Walk 150 feet activity did not occur: Safety/medical concerns         Walk 10 feet on uneven surface  activity   Assist Walk 10 feet on uneven surfaces activity did not occur: Safety/medical concerns         Wheelchair     Assist Will patient use wheelchair at discharge?: No      Wheelchair assist level: Minimal Assistance - Patient > 75%       Wheelchair 50 feet with 2 turns activity    Assist        Assist Level: Minimal Assistance - Patient > 75%   Wheelchair 150 feet activity     Assist      Assist Level: Minimal Assistance - Patient > 75%   Blood pressure 109/73, pulse 73, temperature 98.7 F (37.1 C), temperature source Oral, resp. rate 18, height 5\' 10"  (1.778 m), SpO2 98 %.  Medical Problem List and Plan: 1.  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             -patient may  shower             -ELOS/Goals: 12-14d, Supervision ADL and Mobility   -Continue CIR therapies including PT, OT, and SLP   2.  Antithrombotics: -DVT/anticoagulation: Lovenox             -antiplatelet therapy: Aspirin 325 mg daily, plan was transition to ELIQUIS 5 mg BID 03/19/2021 however cost is an issue so we will resume warfarin with pharmacy consult.  We will ask dietary to educate patient on diet 3. Pain Management: Fioricet as needed headache. controlled 4. Mood/sleep: Melatonin 6 mg nightly  6/16 added trazodone at 9pm scheduled  6/17--reduce to 25mg  qhs given hangover effect at 50mg  dose             -antipsychotic agents: N/A 5. Neuropsych: This patient is not capable of making decisions on his own behalf. 6. Skin/Wound Care: Routine skin checks 7. Fluids/Electrolytes/Nutrition: encouarge po  -I personally reviewed the patient's labs today. -BUN/Cr remain elevated--continue to encourage fluids   -recheck labs Monday 8.  PAF.  Toprol-XL 50 mg daily, Cardizem 120 mg twice daily.  Cardiac rate remains controlled As per neurology okay to resume warfarin 9.  Hyperlipidemia.  Fenofibrate 160 mg daily/Pravachol 10.  History of lung cancer/COPD.  Status post VATS.  Follow-up outpatient.  Continue chronic oxygen as prior to admission 11.  Tobacco abuse.  Counseling 12.  New onset diabetes mellitus.  Hemoglobin A1c 7.5.   SSI.  Consider metformin.  CBG (last 3)  Recent Labs    03/17/21 1605 03/17/21 2020 03/18/21 0602  GLUCAP 92 121* 116*     6/19 well controlled  13.  Obesity.  BMI 31.63.  Dietary follow-up 14. Prerenal azotemia, mild baseline CKD?  -see #7    LOS: 4 days A FACE TO FACE EVALUATION WAS PERFORMED  Charlett Blake 03/18/2021, 6:46 AM

## 2021-03-18 NOTE — Plan of Care (Signed)
  Problem: Consults Goal: RH STROKE PATIENT EDUCATION Description: See Patient Education module for education specifics  Outcome: Progressing   Problem: RH BOWEL ELIMINATION Goal: RH STG MANAGE BOWEL WITH ASSISTANCE Description: STG Manage Bowel with mod I Assistance. Outcome: Progressing   Problem: RH SAFETY Goal: RH STG ADHERE TO SAFETY PRECAUTIONS W/ASSISTANCE/DEVICE Description: STG Adhere to Safety Precautions With cues/reminders Assistance/Device. Outcome: Progressing   Problem: RH PAIN MANAGEMENT Goal: RH STG PAIN MANAGED AT OR BELOW PT'S PAIN GOAL Description: At or below level 4 Outcome: Progressing   Problem: RH KNOWLEDGE DEFICIT Goal: RH STG INCREASE KNOWLEDGE OF DIABETES Description: Patient and wife will be able to manage DM with medications and dietary modifications using handouts and educational resources independently Outcome: Progressing Goal: RH STG INCREASE KNOWLEDGE OF HYPERTENSION Description: Patient and wife will be able to manage  HTN with medications and dietary modifications using handouts and educational resources independently Outcome: Progressing Goal: RH STG INCREASE KNOWLEGDE OF HYPERLIPIDEMIA Description: Patient and wife will be able to manage HLD with medications and dietary modifications using handouts and educational resources independently Outcome: Progressing Goal: RH STG INCREASE KNOWLEDGE OF STROKE PROPHYLAXIS Description: Patient and wife will be able to manage secondary stroke risks with medications and dietary modifications using handouts and educational resources independently Outcome: Progressing

## 2021-03-18 NOTE — Progress Notes (Signed)
Patient and wife reports new, intermittent jerking movements to RUE and to head. O2 at 2L/M, congested, NP cough. Using urinal with assistance. Randall Sullivan

## 2021-03-19 ENCOUNTER — Telehealth (INDEPENDENT_AMBULATORY_CARE_PROVIDER_SITE_OTHER): Payer: Self-pay

## 2021-03-19 ENCOUNTER — Other Ambulatory Visit (HOSPITAL_COMMUNITY): Payer: Self-pay

## 2021-03-19 LAB — GLUCOSE, CAPILLARY
Glucose-Capillary: 114 mg/dL — ABNORMAL HIGH (ref 70–99)
Glucose-Capillary: 102 mg/dL — ABNORMAL HIGH (ref 70–99)
Glucose-Capillary: 130 mg/dL — ABNORMAL HIGH (ref 70–99)
Glucose-Capillary: 119 mg/dL — ABNORMAL HIGH (ref 70–99)

## 2021-03-19 MED ORDER — SORBITOL 70 % SOLN
30.0000 mL | Freq: Once | Status: AC
  Administered 2021-03-19: 30 mL via ORAL
  Filled 2021-03-19: qty 30

## 2021-03-19 NOTE — Plan of Care (Signed)
  Problem: Consults Goal: RH STROKE PATIENT EDUCATION Description: See Patient Education module for education specifics  Outcome: Progressing   Problem: RH BOWEL ELIMINATION Goal: RH STG MANAGE BOWEL WITH ASSISTANCE Description: STG Manage Bowel with mod I Assistance. Outcome: Progressing   Problem: RH SAFETY Goal: RH STG ADHERE TO SAFETY PRECAUTIONS W/ASSISTANCE/DEVICE Description: STG Adhere to Safety Precautions With cues/reminders Assistance/Device. Outcome: Progressing   Problem: RH PAIN MANAGEMENT Goal: RH STG PAIN MANAGED AT OR BELOW PT'S PAIN GOAL Description: At or below level 4 Outcome: Progressing   Problem: RH KNOWLEDGE DEFICIT Goal: RH STG INCREASE KNOWLEDGE OF DIABETES Description: Patient and wife will be able to manage DM with medications and dietary modifications using handouts and educational resources independently Outcome: Progressing Goal: RH STG INCREASE KNOWLEDGE OF HYPERTENSION Description: Patient and wife will be able to manage  HTN with medications and dietary modifications using handouts and educational resources independently Outcome: Progressing Goal: RH STG INCREASE KNOWLEGDE OF HYPERLIPIDEMIA Description: Patient and wife will be able to manage HLD with medications and dietary modifications using handouts and educational resources independently Outcome: Progressing Goal: RH STG INCREASE KNOWLEDGE OF STROKE PROPHYLAXIS Description: Patient and wife will be able to manage secondary stroke risks with medications and dietary modifications using handouts and educational resources independently Outcome: Progressing

## 2021-03-19 NOTE — TOC Benefit Eligibility Note (Addendum)
Patient Teacher, English as a foreign language completed.     The patient is has no pharmacy coverage.    Lyndel Safe, [place] Patient Advocate Specialist [place] [facility] Team Direct Number: [phone]  Fax: [phone]

## 2021-03-19 NOTE — Telephone Encounter (Signed)
Yes, I faxed over Friday morning. And sent a copy to his chart for her. Ans mailed a copy the hime address of Mr Fester.

## 2021-03-19 NOTE — Progress Notes (Signed)
Physical Therapy Session Note  Patient Details  Name: Randall Sullivan MRN: [id number] Date of Birth: 1950/11/17  Today's Date: 03/19/2021 PT Individual Time: 0801-0900 PT Individual Time Calculation (min): 59 min   Short Term Goals: Week 1:  PT Short Term Goal 1 (Week 1): STG=LTG due to ELOS  Skilled Therapeutic Interventions/Progress Updates:  Patient seated upright on EOB on entrance to room. Patient alert and agreeable to PT session. Patient denied pain during session. On 1L O2 via [place]. [place] removed for pt to don t-shirt. [place] left off for few minutes and SpO2 checked with level at 99% at rest while seated. Pt on RA for remainder of session with lowest reading at 90% and rising to 92% within 77min along with reduction in resp rate then improvement back to WNL  Therapeutic Activity: Bed Mobility: Patient performed sit--> supine at end of session with CGA for BLE. VC/ tc required for scooting toward HOB. Transfers: Patient performed STS and SPVT transfers throughout session to RW with CGA. Provided vc for pushing from armrests, and reaching back for armrests as well as general technique.  Gait Training:  Patient ambulated 155' x1/ 26' x1 using RW with CGA/ Min A. Demonstrated decreased step height/ length, push of walker on R, and decreased attention to obstacles on R. Provided vc/ tc for scanning to R, holding level gaze. Seated rest breaks following each bout for cardiorespiratory recovery. SpO2 at 90% after first bout and 92% following 2nd bout.   Neuromuscular Re-ed: NMR facilitated during session with focus on standing balance and executive function.   Pt guided in standing balance challenge using biodex and hitting target task with pt requiring instructions for performance to complete targets into each direction and quadrant in 91min 5sec first bout with 25% accuracy and learning of balance demonstrated with improvement to 21min 3sec and improved accuracy to 37%.   Pt also guided in use of BITS  for number finding activity in sequence from 1-20. Pt requires 6min 47sec to complete with 28 sec reaction time and 33.33% accuracy. Requires vc/ visual cues for net in sequence and position of found object on screen with most items set to R side.   NMR performed for improvements in motor control and coordination, balance, sequencing, judgement, and self confidence/ efficacy in performing all aspects of mobility at highest level of independence.   Patient supine in bed at end of session with brakes locked, bed alarm set, and all needs within reach. Respiratory Tech present to provide breathing treatment at end of session.   Therapy Documentation Precautions:  Precautions Precautions: Fall Restrictions Weight Bearing Restrictions: No  Therapy/Group: Individual Therapy  Alger Simons PT, DPT 03/19/2021, 12:48 PM

## 2021-03-19 NOTE — Progress Notes (Signed)
Speech Language Pathology Daily Session Note  Patient Details  Name: Randall Sullivan MRN: [id number] Date of Birth: 01/18/1951  Today's Date: 03/19/2021 SLP Individual Time: 9476-5465 SLP Individual Time Calculation (min): 42 min  Short Term Goals: Week 1: SLP Short Term Goal 1 (Week 1): Pt will name familiar objects with mod A verbal cues and 80% accuracy. SLP Short Term Goal 2 (Week 1): Pt will follow 2 step commands on 80% of opportunities with mod A verbal cues. SLP Short Term Goal 3 (Week 1): Pt will orient to date, day of the week, and month with min a verbal cues and visual aid. SLP Short Term Goal 4 (Week 1): Pt will demonstrate emergent awareness of deficits with min A verbal cues.  Skilled Therapeutic Interventions:Skilled ST services focused on language skills. SLP facilitated naming of common objects 10 out 10 with 30% sentence completion cues, name function of object in 4 out 5 opportunities, named objects in photos in 5 out 5 opportunities with 40% sentence completion. Pt was able to identify objects in field of 3 in 3 out 3 opportunities and objects In photographs in 4 out 5 opportunities. Pt demonstrated intellectual awareness noting right side weakness, difficulty processing and word finding, along with novel right visual field cut and changes in hearing/auditory processing. Pt was left in room with call bell within reach and bed alarm set. SLP recommends to continue skilled services.     Pain Pain Assessment Pain Scale: 0-10 Pain Score: 0-No pain Pain Type: Acute pain Pain Location: Head Pain Orientation: Left Pain Descriptors / Indicators: Headache Pain Frequency: Occasional Pain Onset: Sudden Patients Stated Pain Goal: 0 Pain Intervention(s): Medication (See eMAR) Multiple Pain Sites: No  Therapy/Group: Individual Therapy  Sana Tessmer  [facility] 03/19/2021, 4:14 PM

## 2021-03-19 NOTE — Progress Notes (Signed)
Occupational Therapy Session Note  Patient Details  Name: Randall Sullivan MRN: [id number] Date of Birth: 1951/06/18  Today's Date: 03/19/2021 OT Individual Time: 1117-1200 OT Individual Time Calculation (min): 43 min    Short Term Goals: Week 1:  OT Short Term Goal 1 (Week 1): Pt will complete LB dressing with CGA and AE prn. OT Short Term Goal 2 (Week 1): Pt will complete grooming and oral care standing sink side with SUP. OT Short Term Goal 3 (Week 1): Pt will be able to manage all aspects of footwear with supervision and AE prn.   Skilled Therapeutic Interventions/Progress Updates:    Pt greeted at time of session sitting EOB just finished with ST, no pain reported throughout session. Sit > stand and transfer to wheelchair CGA with RW. Pt having questions about bathing and given short session, recommended do full shower with OT tomorrow and pt agreeable. NT entered at this time as well checking BS. Pt transported room <> gym via wheelchair for time and performed 2 rounds of BITS dynamic standing activities for the following: 1 round of blue dots only size 10 for 3 minutes and 69% accuracy, and 2nd round numbers 1-20 taking 4:30 w/ 60% accuracy. Note visual field cut on R side evident throughout, needing Mod cues for visual scanning and finding objects. O2 sats remained 94% or higher on RA. Transported back to room and transferred to standing over toilet CGA and stood to urinate with CGA/close supervision. Set up in wheelchair withalarm on call bell in reach and wife [name] present to visit with the pt.    Therapy Documentation Precautions:  Precautions Precautions: Fall Restrictions Weight Bearing Restrictions: No      Therapy/Group: Individual Therapy  Viona Gilmore 03/19/2021, 7:27 AM

## 2021-03-19 NOTE — Telephone Encounter (Signed)
Patients wife [name] called and wanted to know if you received the FMLA paperwork from [employer] for her for her job? She said they faxed it over on Wednesday and to please call her at [phone] to let her know if you did or did not received the paperwork for her to be with her husband.

## 2021-03-19 NOTE — Progress Notes (Signed)
PROGRESS NOTE   Subjective/Complaints:  Pt reports no O2 at home, but requiring 1L O2/here.  No pain- likes to sleep later0 fell asleep 11-12 last night.  LBM 3 days ago.  Feels a little constipated.   ROS:  Pt denies SOB, abd pain, CP, N/V/C/D, and vision changes  Objective:   No results found. No results for input(s): WBC, HGB, HCT, PLT in the last 72 hours.  No results for input(s): NA, K, CL, CO2, GLUCOSE, BUN, CREATININE, CALCIUM in the last 72 hours.   Intake/Output Summary (Last 24 hours) at 03/19/2021 1929 Last data filed at 03/19/2021 1805 Gross per 24 hour  Intake 960 ml  Output 975 ml  Net -15 ml        Physical Exam: Vital Signs Blood pressure 125/83, pulse 79, temperature 99.5 F (37.5 C), temperature source Oral, resp. rate 18, height 5\' 10"  (1.778 m), weight 97.6 kg, SpO2 98 %.    General: awake, alert, appropriate, wearing O2 by [place]; NAD HENT: conjugate gaze; oropharynx moist CV: regular rate; no JVD Pulmonary: coarse and decreased at bases B/L- on 1L O2 by [place]- appears comfortable.  GI: soft, NT, ND, (+)BS Psychiatric: appropriate Neurological: alert  Skin: No evidence of breakdown, no evidence of rash Neuro: reasonable insight and awareness. Right HH, delays in naming objects. RUE 4/5, RLE 4- to 4/5. LUE and LLE 4+ to 5/5.--motor scores stable.  No sensory findings.   Musculoskeletal: no pain in limbs/trunk Tremor right upper extremity intermittent and positional.  No evidence of spasticity  Assessment/Plan: 1. Functional deficits which require 3+ hours per day of interdisciplinary therapy in a comprehensive inpatient rehab setting. Physiatrist is providing close team supervision and 24 hour management of active medical problems listed below. Physiatrist and rehab team continue to assess barriers to discharge/monitor patient progress toward functional and medical goals  Care  Tool:  Bathing    Body parts bathed by patient: Right arm, Left arm, Abdomen, Chest, Front perineal area, Buttocks, Right upper leg, Left upper leg, Left lower leg, Right lower leg, Face         Bathing assist Assist Level: Contact Guard/Touching assist     Upper Body Dressing/Undressing Upper body dressing   What is the patient wearing?: Pull over shirt    Upper body assist Assist Level: Supervision/Verbal cueing    Lower Body Dressing/Undressing Lower body dressing      What is the patient wearing?: Pants     Lower body assist Assist for lower body dressing: Moderate Assistance - Patient 50 - 74%     Toileting Toileting    Toileting assist Assist for toileting: Contact Guard/Touching assist (standing, urine only)     Transfers Chair/bed transfer  Transfers assist     Chair/bed transfer assist level: Contact Guard/Touching assist     Locomotion Ambulation   Ambulation assist      Assist level: Moderate Assistance - Patient 50 - 74% Assistive device: Hand held assist Max distance: 50   Walk 10 feet activity   Assist     Assist level: Moderate Assistance - Patient - 50 - 74%     Walk 50 feet activity   Assist  Assist level: Moderate Assistance - Patient - 50 - 74%      Walk 150 feet activity   Assist Walk 150 feet activity did not occur: Safety/medical concerns         Walk 10 feet on uneven surface  activity   Assist Walk 10 feet on uneven surfaces activity did not occur: Safety/medical concerns         Wheelchair     Assist Will patient use wheelchair at discharge?: No      Wheelchair assist level: Minimal Assistance - Patient > 75%      Wheelchair 50 feet with 2 turns activity    Assist        Assist Level: Minimal Assistance - Patient > 75%   Wheelchair 150 feet activity     Assist      Assist Level: Minimal Assistance - Patient > 75%   Blood pressure 125/83, pulse 79, temperature 99.5  F (37.5 C), temperature source Oral, resp. rate 18, height 5\' 10"  (1.778 m), weight 97.6 kg, SpO2 98 %.  Medical Problem List and Plan: 1.  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             -patient may  shower             -ELOS/Goals: 12-14d, Supervision ADL and Mobility   -Continue CIR- PT, OT and SLP 2.  Antithrombotics: -DVT/anticoagulation: Lovenox             -antiplatelet therapy: Aspirin 325 mg daily, plan was transition to ELIQUIS 5 mg BID 03/19/2021 however cost is an issue per neuro okay to resume warfarin  Discussed with pharmacy who is placing TOC consult to see if any assistance can be given to offset cost. 3. Pain Management: Fioricet as needed headache.   6/20- Pain controlled- con't regimen 4. Mood/sleep: Melatonin 6 mg nightly  6/16 added trazodone at 9pm scheduled  6/17--reduce to 25mg  qhs given hangover effect at 50mg  dose  6/20- fell asleep at midnight- and sleepy this AM- will let Dr Letta Pate decide plan.              -antipsychotic agents: N/A 5. Neuropsych: This patient is not capable of making decisions on his own behalf. 6. Skin/Wound Care: Routine skin checks 7. Fluids/Electrolytes/Nutrition: encouarge po  -I personally reviewed the patient's labs today. -BUN/Cr remain elevated--continue to encourage fluids   -recheck labs Monday 6/20- labs not drawn- reordered for tomorrow 8.  PAF.  Toprol-XL 50 mg daily, Cardizem 120 mg twice daily.  Cardiac rate remains controlled  6/20- rate controlled- con't regimen As per neurology okay to resume warfarin 9.  Hyperlipidemia.  Fenofibrate 160 mg daily/Pravachol 10.  History of lung cancer/COPD.  Status post VATS.  Follow-up outpatient.  Continue chronic oxygen as prior to admission 11.  Tobacco abuse.  Counseling 12.  New onset diabetes mellitus.  Hemoglobin A1c 7.5.  SSI.  Consider metformin.  CBG (last 3)   Recent Labs    03/19/21 0622 03/19/21 1131 03/19/21 1651  GLUCAP 114* 102* 130*    6/20- BG controlled- con't regimen 13.  Obesity.  BMI 31.63.  Dietary follow-up 14. Prerenal azotemia, mild baseline CKD?  -see #7  6/20- will recheck in AM    LOS: 5 days A FACE TO FACE EVALUATION WAS PERFORMED  Amirra Herling 03/19/2021, 7:29 PM

## 2021-03-19 NOTE — Telephone Encounter (Signed)
Yes I did receive it and I filled it out.  I have given it to Randall Sullivan.  She may have already faxed it but please check with her when she gets into the office later today.

## 2021-03-19 NOTE — Telephone Encounter (Signed)
Called Randall Sullivan and gave her the message. Randall Sullivan verbalized an understanding and thanked [place].

## 2021-03-19 NOTE — Progress Notes (Signed)
Occupational Therapy Session Note  Patient Details  Name: Randall Sullivan MRN: [id number] Date of Birth: 1951/07/18  Today's Date: 03/19/2021 OT Individual Time: 0698-6148 OT Individual Time Calculation (min): 28 min    Short Term Goals: Week 1:  OT Short Term Goal 1 (Week 1): Pt will complete LB dressing with CGA and AE prn. OT Short Term Goal 2 (Week 1): Pt will complete grooming and oral care standing sink side with SUP. OT Short Term Goal 3 (Week 1): Pt will be able to manage all aspects of footwear with supervision and AE prn.  Skilled Therapeutic Interventions/Progress Updates:   Pt received in room in bed and consented to OT tx. Pt reports he is worn out, requests to complete exercises in room during session. Pt say EOB with SUP, instructed in 3#db BUE strengthening HEP to increase strength and activity tolerance for ADLs. Pt instructed in shoulder press, elbow flexion, chest press, and shoulder flexion all for 3x15 with min cuing for proper technique with good carryover. Pt required frequent rest breaks due to fatigue. After tx, pt left sitting up EOB with alarm on, call light in reach and all needs met.    Therapy Documentation Precautions:  Precautions Precautions: Fall Restrictions Weight Bearing Restrictions: No  Vital Signs: Therapy Vitals Temp: 99.5 F (37.5 C) Temp Source: Oral Pulse Rate: 79 Resp: 18 BP: 125/83 Patient Position (if appropriate): Sitting Oxygen Therapy SpO2: 98 % O2 Device: Room Air Pain: Pain Assessment Pain Scale: 0-10 Pain Score: Asleep Pain Type: Acute pain Pain Location: Head Pain Orientation: Left Pain Descriptors / Indicators: Headache Pain Frequency: Occasional Pain Onset: Sudden Patients Stated Pain Goal: 0 Pain Intervention(s): Medication (See eMAR) Multiple Pain Sites: No    Therapy/Group: Individual Therapy  Pax Reasoner 03/19/2021, 3:41 PM

## 2021-03-20 LAB — CBC WITH DIFFERENTIAL/PLATELET
Abs Immature Granulocytes: 0.03 10*3/uL (ref 0.00–0.07)
Basophils Absolute: 0 10*3/uL (ref 0.0–0.1)
Basophils Relative: 0 %
Eosinophils Absolute: 0.1 10*3/uL (ref 0.0–0.5)
Eosinophils Relative: 2 %
HCT: 41.8 % (ref 39.0–52.0)
Hemoglobin: 13.6 g/dL (ref 13.0–17.0)
Immature Granulocytes: 0 %
Lymphocytes Relative: 23 %
Lymphs Abs: 1.7 10*3/uL (ref 0.7–4.0)
MCH: 27.7 pg (ref 26.0–34.0)
MCHC: 32.5 g/dL (ref 30.0–36.0)
MCV: 85.1 fL (ref 80.0–100.0)
Monocytes Absolute: 0.8 10*3/uL (ref 0.1–1.0)
Monocytes Relative: 11 %
Neutro Abs: 4.7 10*3/uL (ref 1.7–7.7)
Neutrophils Relative %: 64 %
Platelets: 261 10*3/uL (ref 150–400)
RBC: 4.91 MIL/uL (ref 4.22–5.81)
RDW: 13.6 % (ref 11.5–15.5)
WBC: 7.4 10*3/uL (ref 4.0–10.5)
nRBC: 0 % (ref 0.0–0.2)

## 2021-03-20 LAB — GLUCOSE, CAPILLARY
Glucose-Capillary: 120 mg/dL — ABNORMAL HIGH (ref 70–99)
Glucose-Capillary: 81 mg/dL (ref 70–99)
Glucose-Capillary: 81 mg/dL (ref 70–99)
Glucose-Capillary: 96 mg/dL (ref 70–99)

## 2021-03-20 LAB — PROTIME-INR
INR: 1.1 (ref 0.8–1.2)
Prothrombin Time: 14.4 seconds (ref 11.4–15.2)

## 2021-03-20 LAB — BASIC METABOLIC PANEL
Anion gap: 12 (ref 5–15)
BUN: 18 mg/dL (ref 8–23)
CO2: 30 mmol/L (ref 22–32)
Calcium: 9.4 mg/dL (ref 8.9–10.3)
Chloride: 96 mmol/L — ABNORMAL LOW (ref 98–111)
Creatinine, Ser: 1.35 mg/dL — ABNORMAL HIGH (ref 0.61–1.24)
GFR, Estimated: 56 mL/min — ABNORMAL LOW (ref >60)
Glucose, Bld: 110 mg/dL — ABNORMAL HIGH (ref 70–99)
Potassium: 3.9 mmol/L (ref 3.5–5.1)
Sodium: 138 mmol/L (ref 135–145)

## 2021-03-20 MED ORDER — ENOXAPARIN SODIUM 40 MG/0.4ML IJ SOSY
40.0000 mg | PREFILLED_SYRINGE | Freq: Every day | INTRAMUSCULAR | Status: DC
  Administered 2021-03-21 – 2021-03-23 (×3): 40 mg via SUBCUTANEOUS
  Filled 2021-03-20 (×3): qty 0.4

## 2021-03-20 MED ORDER — WARFARIN - PHARMACIST DOSING INPATIENT: Freq: Every day | Status: DC

## 2021-03-20 MED ORDER — WARFARIN SODIUM 7.5 MG PO TABS
7.5000 mg | ORAL_TABLET | Freq: Once | ORAL | Status: AC
  Administered 2021-03-20: 7.5 mg via ORAL
  Filled 2021-03-20: qty 1

## 2021-03-20 NOTE — Plan of Care (Signed)
  Problem: Consults Goal: RH STROKE PATIENT EDUCATION Description: See Patient Education module for education specifics  Outcome: Progressing   Problem: RH BOWEL ELIMINATION Goal: RH STG MANAGE BOWEL WITH ASSISTANCE Description: STG Manage Bowel with mod I Assistance. Outcome: Progressing   Problem: RH SAFETY Goal: RH STG ADHERE TO SAFETY PRECAUTIONS W/ASSISTANCE/DEVICE Description: STG Adhere to Safety Precautions With cues/reminders Assistance/Device. Outcome: Progressing   Problem: RH PAIN MANAGEMENT Goal: RH STG PAIN MANAGED AT OR BELOW PT'S PAIN GOAL Description: At or below level 4 Outcome: Progressing   Problem: RH KNOWLEDGE DEFICIT Goal: RH STG INCREASE KNOWLEDGE OF DIABETES Description: Patient and wife will be able to manage DM with medications and dietary modifications using handouts and educational resources independently Outcome: Progressing Goal: RH STG INCREASE KNOWLEDGE OF HYPERTENSION Description: Patient and wife will be able to manage  HTN with medications and dietary modifications using handouts and educational resources independently Outcome: Progressing Goal: RH STG INCREASE KNOWLEGDE OF HYPERLIPIDEMIA Description: Patient and wife will be able to manage HLD with medications and dietary modifications using handouts and educational resources independently Outcome: Progressing Goal: RH STG INCREASE KNOWLEDGE OF STROKE PROPHYLAXIS Description: Patient and wife will be able to manage secondary stroke risks with medications and dietary modifications using handouts and educational resources independently Outcome: Progressing

## 2021-03-20 NOTE — Progress Notes (Signed)
ANTICOAGULATION CONSULT NOTE - Initial Consult  Pharmacy Consult for Coumadin Indication: atrial fibrillation  Allergies  Allergen Reactions   No Known Allergies     Patient Measurements: Height: 5\' 10"  (177.8 cm) Weight: 97.6 kg (215 lb 2.7 oz) IBW/kg (Calculated) : 73  Vital Signs: Temp: 98.3 F (36.8 C) (06/21 0502) BP: 126/78 (06/21 0754) Pulse Rate: 81 (06/21 0754)  Labs: Recent Labs    03/20/21 0509  HGB 13.6  HCT 41.8  PLT 261  CREATININE 1.35*    Estimated Creatinine Clearance: 58.8 mL/min (A) (by C-G formula based on SCr of 1.35 mg/dL (H)).   Medical History: Past Medical History:  Diagnosis Date   A-fib ([place]) 2017   after last lung surgery patient had a history if Afib documented in hospital    Allergy    Arthritis    back & legs ( knees)   Cancer ([place])    lung   Complication of anesthesia    agitated upon waking from anesth.    Emphysema    History of lung cancer    Hyperlipidemia    Hypertension    Oxygen dependent    2L continuous   Tobacco abuse     Assessment: Anticoag: Afib, not on anticoag PTA. Started Eliquis 6/20 (got 3 doses), no insurance, changed to warfarin 6/21. Hgb 13.6. Plts 261. CHADS2VASC: at least 4  Goal of Therapy:  Therapeutic oral anticoagulation   Plan:  D/c Eliquis due to cost Stat PT/INR (may be falsely elevated due to Eliquis/lab interaction) Start loading warfarin 7.5mg  po x 1 tonight. Add Lovenox 40mg /d for bridging until INR>2. Daily INR    Randall Sullivan, PharmD, BCPS Clinical Staff Pharmacist [email or web address] Alford Sullivan, [place] 03/20/2021,10:01 AM

## 2021-03-20 NOTE — Progress Notes (Signed)
Occupational Therapy Session Note  Patient Details  Name: Randall Sullivan MRN: [id number] Date of Birth: Nov 19, 1950  Today's Date: 03/20/2021 OT Individual Time: 9381-8299 OT Individual Time Calculation (min): 43 min   Short Term Goals: Week 1:  OT Short Term Goal 1 (Week 1): Pt will complete LB dressing with CGA and AE prn. OT Short Term Goal 2 (Week 1): Pt will complete grooming and oral care standing sink side with SUP. OT Short Term Goal 3 (Week 1): Pt will be able to manage all aspects of footwear with supervision and AE prn.  Skilled Therapeutic Interventions/Progress Updates:    Pt greeted seated EOB finishing lunch and agreeable to OT treatment session focused on self-care retraining. Pt wanted to shower today. Pt on 1L of O2 throughout shower. Pt reported need to urinate once in bathroom. CGA for standing balance when urinating in shower. Pt then backed up to the tub bench in shower with CGA and verbal cues for safety. Bathing with CGA for balance to wash buttocks. Pt ambulated back out of bathroom w/ RW and CGA. LB dressing at EOB with min A and increased time. Some increased difficulty when trying to reach down and don socks. Pt reported feeling like his chest was tight. SpO2 100% on 1L. OT removed O2 and SpO2 maintained at 97% on RA. Nursing informed of pt feeling like his chest was tight and he may need a breathing treatment. Stand-pivot to wc with CGA. Pt left seated in wc with chair alarm on, call bell in reach, and needs met.  Therapy Documentation Precautions:  Precautions Precautions: Fall Restrictions Weight Bearing Restrictions: No Pain: Pain Assessment Pain Score: 0-No pain  Therapy/Group: Individual Therapy  Valma Cava 03/20/2021, 1:25 PM

## 2021-03-20 NOTE — Progress Notes (Signed)
Physical Therapy Session Note  Patient Details  Name: Randall Sullivan MRN: [id number] Date of Birth: 27-Dec-1950  Today's Date: 03/20/2021 PT Individual Time: [id number] and 1446-1530  PT Individual Time Calculation (min): 57 min and 44 min     Short Term Goals: Week 1:  PT Short Term Goal 1 (Week 1): STG=LTG due to ELOS  Skilled Therapeutic Interventions/Progress Updates:  Session 1: Patient supine in bed on entrance to room. Wife present. Patient alert and agreeable to PT session but relates headache pain that he feels over L eye. Vitals checked with BP 127/ 88, SpO2 99% on RA and pulse 76. With encouragement, pt agreeable for low level therapy session using 1L supplemental O2.   Therapeutic Activity: Bed Mobility: Patient performed supine <> sit with supervision/ Mod I. No c/o dizziness or increase in headaches pain upon reaching seated position. No vc required for supine to sit. To return to supine, pt requires vc for initiation.  Transfers: Patient performed STS and SPVT transfers with close supervision. Provided vc for hand progression in descent to sit. Toilet transfer performed with supervision and pt able to stand at toilet and maintain standing balance throughout with RW placed over toilet for safety.   Gait Training:  Patient ambulated 120 feet using RW with close supervision. Demonstrated increasing respiratory rate. Provided vc/ tc for slowing pace. Education provided re: energy conservation and need for rest breaks or slowing in order to maintain slower respiratory rate d/t emphysema and history with lung cancer. On seated rest break, resp rate at 24 breaths/ min and O2 at 90% but quickly rising to 93% within 20sec and 96% within 52min. Longer time required to return resp rate to resting range. Return amb to room with focus on slowing pace with improved symptoms upon reaching room.   MD present for morning rounds at end of session and pt encouraged to relate headache pain that pt was  experiencing at start of session even though pain level has decreased.    Patient supine  in bed at end of session with brakes locked, bed alarm set, and all needs within reach.  Session 2: Patient seated upright in w/c on entrance to room with shirt removed. Patient alert and agreeable to PT session. Patient denied pain during session and no longer feels headache pain from morning, but feels soreness around R shoulder. Session performed on RA with no supplemental O2.  Therapeutic Activity: Transfers: Patient performed STS and SPVT transfers throughout session with supervision. Provided verbal cues for hand progression for safety. Toilet transfer performed with supervision. Clothing mgmt and pericare completed with supervision.  Gait Training:  Patient ambulated 150' x2 using RW with close supervision and intermittent CGA. Provided vc/ tc throughout for energy conservation and slowing overall pace in order to reach further distance with reduced cardiorespiratory symptoms. On seated rest, SpO2 is 90% and rises to 95% within 99min, then ultimately reaches 98% prior to starting NuStep.  Therapeutic Exercise: Pt guided in continuous reciprocation of BUE and BLE using NuStep L2 x 10min/ L2 x30min with focus on maintaining slow continuous movement in order to increase general joint mobility, increase strength and cardioresp endurance, improve pain/ soreness symptoms in shoulder, and continue to educate on energy conservation in new context. Pt initially increases speed to almost 60 steps/ min and provided with repeated education re: energy conservation and attempt to bring overall speed below 50 steps/ min for first 100min. Then requested pt to reduce speed to around 40 steps/ min for next  66min. Pt requests rest break. Resp rate at 24 breaths/ min initially. Returns to normal range in 29min.  Focus with pt on maintaining speed around 35 steps/min for remaining 8min bout.   Patient supine  in bed at end of  session with brakes locked, bed alarm set, and all needs within reach.   Therapy Documentation Precautions:  Precautions Precautions: Fall Restrictions Weight Bearing Restrictions: No  Therapy/Group: Individual Therapy  Alger Simons PT, DPT 03/20/2021, 7:56 AM

## 2021-03-20 NOTE — Progress Notes (Signed)
Speech Language Pathology Daily Session Note  Patient Details  Name: Randall Sullivan MRN: [id number] Date of Birth: 01/03/1951  Today's Date: 03/20/2021 SLP Individual Time: 1005-1050 SLP Individual Time Calculation (min): 45 min  Short Term Goals: Week 1: SLP Short Term Goal 1 (Week 1): Pt will name familiar objects with mod A verbal cues and 80% accuracy. SLP Short Term Goal 2 (Week 1): Pt will follow 2 step commands on 80% of opportunities with mod A verbal cues. SLP Short Term Goal 3 (Week 1): Pt will orient to date, day of the week, and month with min a verbal cues and visual aid. SLP Short Term Goal 4 (Week 1): Pt will demonstrate emergent awareness of deficits with min A verbal cues.  Skilled Therapeutic Interventions:Skilled ST services focused on cognitive skills. SLP facilitated basic problem solving skills and 2 step directions in novel card task "Blink,"  pt required max A verbal cues to match, but was able to complete matches with mod A verbal cues on occasion. Pt demonstrated poor tolerance frustration level and SLP adjust task to sorting in a field of 6 by color in which pt required initial demonstration to understand concept, but was able to sort 1 step mod I. Pt was left in room with call bell within reach and bed alarm set. SLP recommends to continue skilled services.     Pain Pain Assessment Pain Score: 0-No pain  Therapy/Group: Individual Therapy  Randall Sullivan  [facility] 03/20/2021, 12:03 PM

## 2021-03-20 NOTE — Progress Notes (Signed)
PROGRESS NOTE   Subjective/Complaints:  Had O2 tank at home but rarely used O2  ROS:  Pt denies SOB, abd pain, CP, N/V/C/D, and vision changes  Objective:   No results found. Recent Labs    03/20/21 0509  WBC 7.4  HGB 13.6  HCT 41.8  PLT 261    Recent Labs    03/20/21 0509  NA 138  K 3.9  CL 96*  CO2 30  GLUCOSE 110*  BUN 18  CREATININE 1.35*  CALCIUM 9.4     Intake/Output Summary (Last 24 hours) at 03/20/2021 0836 Last data filed at 03/20/2021 0750 Gross per 24 hour  Intake 840 ml  Output 450 ml  Net 390 ml         Physical Exam: Vital Signs Blood pressure 126/78, pulse 81, temperature 98.3 F (36.8 C), resp. rate 18, height 5\' 10"  (1.778 m), weight 97.6 kg, SpO2 100 %.    General: awake, alert, appropriate, wearing O2 by [place]; NAD HENT: conjugate gaze; oropharynx moist CV: regular rate; no JVD Pulmonary: coarse and decreased at bases B/L- on 1L O2 by [place]- appears comfortable.  GI: soft, NT, ND, (+)BS Psychiatric: appropriate Neurological: alert  Skin: No evidence of breakdown, no evidence of rash Neuro: reasonable insight and awareness. Right HH, delays in naming objects. RUE 4/5, RLE 4- to 4/5. LUE and LLE 4+ to 5/5.--motor scores stable.  No sensory findings.   Musculoskeletal: no pain in limbs/trunk Tremor right upper extremity intermittent and positional.  No evidence of spasticity  Assessment/Plan: 1. Functional deficits which require 3+ hours per day of interdisciplinary therapy in a comprehensive inpatient rehab setting. Physiatrist is providing close team supervision and 24 hour management of active medical problems listed below. Physiatrist and rehab team continue to assess barriers to discharge/monitor patient progress toward functional and medical goals  Care Tool:  Bathing    Body parts bathed by patient: Right arm, Left arm, Abdomen, Chest, Front perineal area, Buttocks,  Right upper leg, Left upper leg, Left lower leg, Right lower leg, Face         Bathing assist Assist Level: Contact Guard/Touching assist     Upper Body Dressing/Undressing Upper body dressing   What is the patient wearing?: Pull over shirt    Upper body assist Assist Level: Supervision/Verbal cueing    Lower Body Dressing/Undressing Lower body dressing      What is the patient wearing?: Pants     Lower body assist Assist for lower body dressing: Moderate Assistance - Patient 50 - 74%     Toileting Toileting    Toileting assist Assist for toileting: Contact Guard/Touching assist (standing, urine only)     Transfers Chair/bed transfer  Transfers assist     Chair/bed transfer assist level: Contact Guard/Touching assist     Locomotion Ambulation   Ambulation assist      Assist level: Moderate Assistance - Patient 50 - 74% Assistive device: Hand held assist Max distance: 50   Walk 10 feet activity   Assist     Assist level: Moderate Assistance - Patient - 50 - 74%     Walk 50 feet activity   Assist  Assist level: Moderate Assistance - Patient - 50 - 74%      Walk 150 feet activity   Assist Walk 150 feet activity did not occur: Safety/medical concerns         Walk 10 feet on uneven surface  activity   Assist Walk 10 feet on uneven surfaces activity did not occur: Safety/medical concerns         Wheelchair     Assist Will patient use wheelchair at discharge?: No      Wheelchair assist level: Minimal Assistance - Patient > 75%      Wheelchair 50 feet with 2 turns activity    Assist        Assist Level: Minimal Assistance - Patient > 75%   Wheelchair 150 feet activity     Assist      Assist Level: Minimal Assistance - Patient > 75%   Blood pressure 126/78, pulse 81, temperature 98.3 F (36.8 C), resp. rate 18, height 5\' 10"  (1.778 m), weight 97.6 kg, SpO2 100 %.  Medical Problem List and Plan: 1.   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             -patient may  shower             -ELOS/Goals: 12-14d, Supervision ADL and Mobility - team conf in am   -Continue CIR- PT, OT and SLP 2.  Antithrombotics: -DVT/anticoagulation: Lovenox             -antiplatelet therapy: Aspirin 325 mg daily, plan was transition to ELIQUIS 5 mg BID 03/19/2021 however cost is an issue per neuro okay to resume warfarin  Appreciate TOC consult tno assistance - resume warfarin 3. Pain Management: Fioricet as needed headache.   6/20- Pain controlled- con't regimen 4. Mood/sleep: Melatonin 6 mg nightly  6/16 added trazodone at 9pm scheduled  6/17--reduce to 25mg  qhs given hangover effect at 50mg  dose               -antipsychotic agents: N/A 5. Neuropsych: This patient is not capable of making decisions on his own behalf. 6. Skin/Wound Care: Routine skin checks 7. Fluids/Electrolytes/Nutrition: encouarge po  -I personally reviewed the patient's labs today. -BUN/Cr remain elevated--continue to encourage fluids   -recheck labs Monday 6/20- labs not drawn- reordered for tomorrow 8.  PAF.  Toprol-XL 50 mg daily, Cardizem 120 mg twice daily.  Cardiac rate remains controlled  6/20- rate controlled- con't regimen Vitals:   03/20/21 0502 03/20/21 0754  BP: 131/71 126/78  Pulse: 84 81  Resp: 18   Temp: 98.3 F (36.8 C)   SpO2: 100%    Controlled HR As per neurology okay to resume warfarin 9.  Hyperlipidemia.  Fenofibrate 160 mg daily/Pravachol 10.  History of lung cancer/COPD.  Status post VATS.  Follow-up outpatient.  Continue chronic oxygen as prior to admission 11.  Tobacco abuse.  Counseling 12.  New onset diabetes mellitus.  Hemoglobin A1c 7.5.  SSI.  Consider metformin.  CBG (last 3)  Recent Labs    03/19/21 1651 03/19/21 2058 03/20/21 0608  GLUCAP 130* 119* 120*     6/21- BG controlled- con't  regimen 13.  Obesity.  BMI 31.63.  Dietary follow-up 14. Prerenal azotemia, mild baseline CKD?  BMET stable     LOS: 6 days A FACE TO FACE EVALUATION WAS PERFORMED  Charlett Blake 03/20/2021, 8:36 AM

## 2021-03-21 LAB — GLUCOSE, CAPILLARY
Glucose-Capillary: 106 mg/dL — ABNORMAL HIGH (ref 70–99)
Glucose-Capillary: 95 mg/dL (ref 70–99)
Glucose-Capillary: 102 mg/dL — ABNORMAL HIGH (ref 70–99)
Glucose-Capillary: 101 mg/dL — ABNORMAL HIGH (ref 70–99)

## 2021-03-21 LAB — PROTIME-INR
INR: 1.2 (ref 0.8–1.2)
Prothrombin Time: 14.8 seconds (ref 11.4–15.2)

## 2021-03-21 MED ORDER — WARFARIN SODIUM 7.5 MG PO TABS
7.5000 mg | ORAL_TABLET | Freq: Once | ORAL | Status: AC
  Administered 2021-03-21: 7.5 mg via ORAL
  Filled 2021-03-21: qty 1

## 2021-03-21 NOTE — Progress Notes (Signed)
Physical Therapy Session Note  Patient Details  Name: Randall Sullivan MRN: [id number] Date of Birth: 08/24/1951  Today's Date: 03/21/2021 PT Individual Time: 1445-1530  PT Individual Time Calculation (min): 45 min   Short Term Goals: Week 1:  PT Short Term Goal 1 (Week 1): STG=LTG due to ELOS  Skilled Therapeutic Interventions/Progress Updates:  Session 1: Patient semireclined in bed on entrance to room. Wife present. Patient alert and agreeable to PT session. Patient denied pain during session.  Therapeutic Activity: Bed Mobility: Patient performed supine <> sit with Mod I. VC to remain seated prior to standing in order to orient body to change in transition. Transfers: Patient performed STS and SPVT transfers throughout session with Mod I/ supervision. Provided minimal vc for reaching back to seat prior to sitting to orient position at seat.  Gait Training:  Patient ambulated 125' x2 with no AD and supervision. Provided vc for increasing RLE step height and managing speed to conserve energy.  Neuromuscular Re-ed: NMR facilitated during session with focus on standing balance. Pt guided in static standing on [employer] and completion of block building task. Pt requires heavy vc to problem solve what color blocks are needed and where to put them to complete the puzzle. Is able to stand throughout for >49min with only intermittent hand holds, no LOB and no increase in sway. Good ankle strategy noted bilaterally. NMR performed for improvements in motor control and coordination, balance, sequencing, judgement, and self confidence/ efficacy in performing all aspects of mobility at highest level of independence.   Patient supine  in bed at end of session with brakes locked, bed alarm set, and all needs within reach.  Session 2:  Patient semireclined in bed on entrance to room. Wife present. Patient alert and agreeable to PT session. Patient c/o headache pain over L eye and temple at end of session. RN  notified.  Over this week, timing of headache pain is not noted to be related to efforts/movement vs rest or oxygen levels/ breathing rates.  Therapeutic Activity: Bed Mobility: Patient performed supine <> sit with Mod I. No vc required. Transfers: Patient performed STS and SPVT transfers with Mod I/ supervision. Provided vc for positioning at seat.  Gait Training:  Patient ambulated 125' x1/ 155' x1 with no AD and supervision. VC throughout for slow pace to conserve energy especially with longer distance. Nearing end of longer bout, wheezing noted and upon sitting on EOB, respiratory rate is 36 breaths/ min. In <66min rate has reduced to 24 breaths/min and pt's anxiety is reduced. Pt asks again what makes him short of breath. Pt provided with repeat education re: nature of emphysema, inability to replace oxygen and energy stores in the muscles efficiently, and therefore need for slower pace while walking/ working in order to conserve energy.  Provided examples of times when he would need to take a break. Reminded him that he knows when his body feels better and will therefore know when he is okay to participate in task/ chore again.   Neuromuscular Re-ed: NMR facilitated during session with focus on standing balance. Pt guided in Wii game of balancing penguin on iceberg. Able to perform 3 bouts prior to seated rest, then 3 more bouts. Picks up nature of game but is unable to adequately fine tune balance especially to R side. NMR performed for improvements in motor control and coordination, balance, sequencing, judgement, and self confidence/ efficacy in performing all aspects of mobility at highest level of independence.   Patient supine  in bed at end of session with brakes locked, bed alarm set, and all needs within reach.     Therapy Documentation Precautions:  Precautions Precautions: Fall Restrictions Weight Bearing Restrictions: No Pain: Pain Assessment Pain Scale: 0-10 Pain Score: 5   Pain Type: Acute pain Pain Location: Head Pain Orientation: Mid;Anterior Pain Descriptors / Indicators: Headache Pain Frequency: Occasional Pain Onset: Progressive Patients Stated Pain Goal: 0 Pain Intervention(s): Medication (See eMAR)  Therapy/Group: Individual Therapy  Alger Simons 03/21/2021, 10:20 AM

## 2021-03-21 NOTE — Progress Notes (Signed)
Patient ID: Randall Sullivan, male   DOB: Nov 13, 1950, 70 y.o.   MRN: [id number]  Patient accepted by [facility].   [place], [place]

## 2021-03-21 NOTE — Progress Notes (Signed)
Patient ID: Randall Sullivan, male   DOB: October 08, 1950, 70 y.o.   MRN: [id number]   [facility] referral sent to [place], [place]

## 2021-03-21 NOTE — Progress Notes (Signed)
Occupational Therapy Session Note  Patient Details  Name: Randall Sullivan MRN: [id number] Date of Birth: 1951-03-13  Today's Date: 03/21/2021 OT Individual Time: 0700-0756 OT Individual Time Calculation (min): 56 min    Short Term Goals: Week 1:  OT Short Term Goal 1 (Week 1): Pt will complete LB dressing with CGA and AE prn. OT Short Term Goal 2 (Week 1): Pt will complete grooming and oral care standing sink side with SUP. OT Short Term Goal 3 (Week 1): Pt will be able to manage all aspects of footwear with supervision and AE prn.  Skilled Therapeutic Interventions/Progress Updates:    Pt received seated EOB finishing breakfast, denies pain but c/o chest "tightness" but agreeable to therapy. Session focus on R visual scanning + RUE NMR + activity tolerance in prep for improved ADL/func mobility performance. Declines ADL this morning, donned shirt for therapy with close S seated EOB. Stood to urinate from bed with no AD with CGA, cont void of urine with urinal with CGA for balance + pericare in standing. W/c transport to and from gym total A 2/2 time management + energy conservation. SatO2 on 1L throughout session read at 95 - 97% with activity. Stood at [employer] to complete single target game with RW with close S for reaching outside BOS, req increased time to locate targets and accurately hit targets, but req no additional cuing. Additionally completed Bell Cancelation in 6 min 9 secs, 6 misses (5 on R), 11 Distractors. Demonstrated disorganized search patterns, discussed benefits of utilizing organized search pattern + compensatory visual scanning techniques to improve ADL/IADL performance. Verbalized understanding, but will benefit from further education. Guided through PLB throughout activity and pt reports 5/10 mod RPE after extended seated rest break and that he felt "winded." Finally, assessed B grip strength + administered 9HPT with the following results:   R: 85, 86, 72; average of 81 lbs; 1 min 19  secs L: 74, 86, 81; average of 80 lbs; 49 secs   Pt left seated in w/c with RN present, with bed alarm engaged, call bell in reach, and all immediate needs met.    Therapy Documentation Precautions:  Precautions Precautions: Fall Restrictions Weight Bearing Restrictions: No  Pain:  denies ADL: See Care Tool for more details.    Therapy/Group: Individual Therapy  Volanda Napoleon MS, OTR/L   03/21/2021, 6:49 AM

## 2021-03-21 NOTE — Progress Notes (Signed)
PROGRESS NOTE   Subjective/Complaints:  No SOB at rest on 1L O2  ROS:  Pt denies SOB, abd pain, CP, N/V/C/D, and vision changes  Objective:   No results found. Recent Labs    03/20/21 0509  WBC 7.4  HGB 13.6  HCT 41.8  PLT 261     Recent Labs    03/20/21 0509  NA 138  K 3.9  CL 96*  CO2 30  GLUCOSE 110*  BUN 18  CREATININE 1.35*  CALCIUM 9.4      Intake/Output Summary (Last 24 hours) at 03/21/2021 6378 Last data filed at 03/21/2021 0800 Gross per 24 hour  Intake 720 ml  Output 950 ml  Net -230 ml         Physical Exam: Vital Signs Blood pressure 125/75, pulse 75, temperature 98.5 F (36.9 C), resp. rate 16, height $RemoveBe'5\' 10"'szbtfKkjb$  (1.778 m), weight 97.6 kg, SpO2 97 %.  General: No acute distress Mood and affect are appropriate Heart: Regular rate and rhythm no rubs murmurs or extra sounds Lungs: Clear to auscultation, breathing unlabored, no rales or wheezes Abdomen: Positive bowel sounds, soft nontender to palpation, nondistended Extremities: No clubbing, cyanosis, or edema Skin: No evidence of breakdown, no evidence of rash  Skin: No evidence of breakdown, no evidence of rash Neuro: reasonable insight and awareness. Right HH, delays in naming objects. RUE 4/5, RLE 4- to 4/5. LUE and LLE 4+ to 5/5.--motor scores stable.  No sensory findings.   Musculoskeletal: no pain in limbs/trunk Tremor right upper extremity intermittent and positional.  No evidence of spasticity  Assessment/Plan: 1. Functional deficits which require 3+ hours per day of interdisciplinary therapy in a comprehensive inpatient rehab setting. Physiatrist is providing close team supervision and 24 hour management of active medical problems listed below. Physiatrist and rehab team continue to assess barriers to discharge/monitor patient progress toward functional and medical goals  Care Tool:  Bathing    Body parts bathed by  patient: Right arm, Left arm, Abdomen, Chest, Front perineal area, Buttocks, Right upper leg, Left upper leg, Left lower leg, Right lower leg, Face         Bathing assist Assist Level: Contact Guard/Touching assist     Upper Body Dressing/Undressing Upper body dressing   What is the patient wearing?: Pull over shirt    Upper body assist Assist Level: Supervision/Verbal cueing    Lower Body Dressing/Undressing Lower body dressing      What is the patient wearing?: Pants     Lower body assist Assist for lower body dressing: Minimal Assistance - Patient > 75%     Toileting Toileting    Toileting assist Assist for toileting: Contact Guard/Touching assist     Transfers Chair/bed transfer  Transfers assist     Chair/bed transfer assist level: Contact Guard/Touching assist     Locomotion Ambulation   Ambulation assist      Assist level: Contact Guard/Touching assist Assistive device: Walker-rolling Max distance: 184ft   Walk 10 feet activity   Assist     Assist level: Contact Guard/Touching assist Assistive device: No Device   Walk 50 feet activity   Assist    Assist level: Contact  Guard/Touching assist Assistive device: Walker-rolling    Walk 150 feet activity   Assist Walk 150 feet activity did not occur: Safety/medical concerns  Assist level: Contact Guard/Touching assist Assistive device: Walker-rolling    Walk 10 feet on uneven surface  activity   Assist Walk 10 feet on uneven surfaces activity did not occur: Safety/medical concerns         Wheelchair     Assist Will patient use wheelchair at discharge?: No      Wheelchair assist level: Minimal Assistance - Patient > 75%      Wheelchair 50 feet with 2 turns activity    Assist        Assist Level: Minimal Assistance - Patient > 75%   Wheelchair 150 feet activity     Assist      Assist Level: Minimal Assistance - Patient > 75%   Blood pressure  125/75, pulse 75, temperature 98.5 F (36.9 C), resp. rate 16, height $RemoveBe'5\' 10"'WtkzbCRCV$  (1.778 m), weight 97.6 kg, SpO2 97 %.  Medical Problem List and Plan: 1.  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             -patient may  shower             -ELOS/Goals: 12-14d, Supervision ADL and Mobility - Team conference today please see physician documentation under team conference tab, met with team  to discuss problems,progress, and goals. Formulized individual treatment plan based on medical history, underlying problem and comorbidities.   -Continue CIR- PT, OT and SLP 2.  Antithrombotics: -DVT/anticoagulation: Lovenox             -antiplatelet therapy: Aspirin 325 mg daily, plan was transition to ELIQUIS 5 mg BID 03/19/2021 however cost is an issue per neuro okay to resume warfarin  Appreciate TOC consult tno assistance - resume warfarin 3. Pain Management: Fioricet as needed headache.   6/20- Pain controlled- con't regimen 4. Mood/sleep: Melatonin 6 mg nightly  6/16 added trazodone at 9pm scheduled  6/17--reduce to $RemoveBeforeD'25mg'FRYLOieTwBWaQu$  qhs given hangover effect at $RemoveB'50mg'vxSvBAfT$  dose               -antipsychotic agents: N/A 5. Neuropsych: This patient is not capable of making decisions on his own behalf. 6. Skin/Wound Care: Routine skin checks 7. Fluids/Electrolytes/Nutrition: encouarge po  -I personally reviewed the patient's labs today. -BUN/Cr remain elevated--continue to encourage fluids   -recheck labs Monday 6/20- labs not drawn- reordered for tomorrow 8.  PAF.  Toprol-XL 50 mg daily, Cardizem 120 mg twice daily.  Cardiac rate remains controlled  6/20- rate controlled- con't regimen Vitals:   03/20/21 2057 03/21/21 0333  BP:  125/75  Pulse:  75  Resp:  16  Temp:  98.5 F (36.9 C)  SpO2: 97% 97%   Controlled HR As per neurology okay to resume warfarin 9.  Hyperlipidemia.  Fenofibrate 160 mg  daily/Pravachol 10.  History of lung cancer/COPD.  Status post VATS.  Follow-up outpatient.  d/c O2 not using at home although he had a tank 11.  Tobacco abuse.  Counseling 12.  New onset diabetes mellitus.  Hemoglobin A1c 7.5.  SSI.  Consider metformin.  CBG (last 3)  Recent Labs    03/20/21 1630 03/20/21 2118 03/21/21 0618  GLUCAP 81 96 106*     6/22  BG controlled- con't regimen 13.  Obesity.  BMI 31.63.  Dietary follow-up 14. Prerenal azotemia,  mild baseline CKD?  BMET stable     LOS: 7 days A FACE TO FACE EVALUATION WAS PERFORMED  Charlett Blake 03/21/2021, 9:21 AM

## 2021-03-21 NOTE — Progress Notes (Signed)
ANTICOAGULATION CONSULT NOTE - Initial Consult  Pharmacy Consult for Coumadin Indication: atrial fibrillation  Allergies  Allergen Reactions   No Known Allergies     Patient Measurements: Height: 5\' 10"  (177.8 cm) Weight: 97.6 kg (215 lb 2.7 oz) IBW/kg (Calculated) : 73  Vital Signs: Temp: 98.5 F (36.9 C) (06/22 0333) BP: 125/75 (06/22 0333) Pulse Rate: 75 (06/22 0333)  Labs: Recent Labs    03/20/21 0509 03/20/21 1038 03/21/21 0505  HGB 13.6  --   --   HCT 41.8  --   --   PLT 261  --   --   LABPROT  --  14.4 14.8  INR  --  1.1 1.2  CREATININE 1.35*  --   --      Estimated Creatinine Clearance: 58.8 mL/min (A) (by C-G formula based on SCr of 1.35 mg/dL (H)).   Medical History: Past Medical History:  Diagnosis Date   A-fib ([place]) 2017   after last lung surgery patient had a history if Afib documented in hospital    Allergy    Arthritis    back & legs ( knees)   Cancer ([place])    lung   Complication of anesthesia    agitated upon waking from anesth.    Emphysema    History of lung cancer    Hyperlipidemia    Hypertension    Oxygen dependent    2L continuous   Tobacco abuse     Assessment:  Anticoag: Afib, not on anticoag PTA. Started Eliquis 6/20 (got 3 doses), no insurance, changed to warfarin 6/21. Hgb 13.6. Plts 261. CHADS2VASC: at least 4 - 6/21: INR 1.2  Goal of Therapy:  Therapeutic oral anticoagulation   Plan:  Coumadin 7.5mg  po x 1 tonight. Lovenox 40mg /d for bridging until INR>2. Daily INR    Zaydon Kinser S. Alford Highland, PharmD, BCPS Clinical Staff Pharmacist [email or web address] Alford Highland, [place] 03/21/2021,10:21 AM

## 2021-03-21 NOTE — Progress Notes (Signed)
Speech Language Pathology Daily Session Note  Patient Details  Name: Randall Sullivan MRN: [id number] Date of Birth: 05/14/1951  Today's Date: 03/21/2021 SLP Individual Time: 1779-3903 SLP Individual Time Calculation (min): 40 min  Short Term Goals: Week 1: SLP Short Term Goal 1 (Week 1): Pt will name familiar objects with mod A verbal cues and 80% accuracy. SLP Short Term Goal 2 (Week 1): Pt will follow 2 step commands on 80% of opportunities with mod A verbal cues. SLP Short Term Goal 3 (Week 1): Pt will orient to date, day of the week, and month with min a verbal cues and visual aid. SLP Short Term Goal 4 (Week 1): Pt will demonstrate emergent awareness of deficits with min A verbal cues.  Skilled Therapeutic Interventions:   Patient seen for skilled ST session focusing language goals. He does continue with visual impairment which impacts his ability to focus on objects and photo cards. He named 3/10 object photos without cues, and named 7/10 with semantic cues (its a food, etc). When presented with object photo cards in fields of two, three and four, he was able to quickly point to them when named, with 100% accuracy. Patient appears with more awareness to his expressive language deficits with anomia appearing to be most impaired. Patient continues to benefit from skilled SLP intervention to maximize language and cognitive function prior to discharge.  Pain Pain Assessment Pain Scale: 0-10 Faces Pain Scale: No hurt  Therapy/Group: Individual Therapy  Sonia Baller, MA, CCC-SLP Speech Therapy

## 2021-03-21 NOTE — Discharge Summary (Signed)
Physician Discharge Summary  Patient ID: Randall Sullivan MRN: [id number] DOB/AGE: Apr 30, 1951 70 y.o.  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 Hyperinflation/emphysema. Heart is normal size. No acute confluent opacities or effusions. IMPRESSION: COPD/chronic changes.  No active disease. Electronically Signed   By: Rolm Baptise M.D.   On: 03/12/2021 02:19   CT Head Wo Contrast  Result Date: 03/12/2021 CLINICAL DATA:  Cerebral hemorrhage suspected EXAM: CT HEAD WITHOUT CONTRAST TECHNIQUE: Contiguous axial images were obtained from the base of the skull through the vertex without intravenous contrast. COMPARISON:  03/07/2018 FINDINGS: Brain: Mild diffuse atrophy. No acute intracranial abnormality. Specifically, no hemorrhage, hydrocephalus, mass lesion, acute infarction, or significant intracranial injury. Vascular: No hyperdense vessel or unexpected calcification. Skull: No acute calvarial abnormality. Sinuses/Orbits: No acute findings Other: None IMPRESSION: No acute intracranial abnormality. Electronically Signed   By: Rolm Baptise M.D.   On: 03/12/2021 02:48   MR ANGIO HEAD WO CONTRAST  Result Date: 03/12/2021 CLINICAL DATA:  Bilateral weakness EXAM: MRI HEAD WITHOUT CONTRAST MRA HEAD WITHOUT CONTRAST TECHNIQUE: Multiplanar, multi-echo pulse sequences of the brain and surrounding structures were acquired without intravenous contrast. Angiographic images of the  Circle of Willis were acquired using MRA technique without intravenous contrast. COMPARISON:  MRI brain 07/23/2018 FINDINGS: MRI HEAD Brain: There is restricted diffusion in the left PCA territory involving the parasagittal occipital lobe and medial left temporal lobe. Subtle early changes are likely present in retrospect on the prior CT. There is additional involvement of the left thalamus. Minimal involvement of the contralateral right thalamus is also noted. There is no evidence of intracranial hemorrhage. Prominence of the ventricles and sulci reflects generalized parenchymal volume loss. Additional patchy T2 hyperintensity in the supratentorial white matter is nonspecific but may reflect minor chronic microvascular ischemic changes. 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 There is no intracranial mass or significant mass effect. There is no hydrocephalus or extra-axial fluid collection. Vascular: Major vessel flow voids at the skull base are preserved. Skull and upper cervical spine: Normal marrow signal is preserved. Sinuses/Orbits: Paranasal sinuses are aerated. Orbits are unremarkable. Other: Sella is unremarkable.  Mastoid air cells are clear. MRA HEAD Intracranial internal carotid arteries are patent with atherosclerotic irregularity. Middle and anterior cerebral arteries are patent. Intracranial vertebral arteries are patent. Basilar artery is patent. Right posterior cerebral artery is patent. Left P1 PCA is patent. There is occlusion of the left PCA at the P1-P2 junction. No aneurysm. IMPRESSION: Acute large left PCA territory infarction. Left PCA occlusion at the P1-P2 junction. Small acute right thalamic infarct. No hemorrhage or mass effect. These results were called by telephone at the time of interpretation on 03/12/2021 at 10:18 am to provider [facility] , who verbally acknowledged these results. Electronically Signed   By: Macy Mis M.D.   On:  03/12/2021 10:27   MR BRAIN WO CONTRAST  Result Date: 03/12/2021 CLINICAL DATA:  Bilateral weakness EXAM: MRI HEAD WITHOUT CONTRAST MRA HEAD WITHOUT CONTRAST  TECHNIQUE: Multiplanar, multi-echo pulse sequences of the brain and surrounding structures were acquired without intravenous contrast. Angiographic images of the Circle of Willis were acquired using MRA technique without intravenous contrast. COMPARISON:  MRI brain 07/23/2018 FINDINGS: MRI HEAD Brain: There is restricted diffusion in the left PCA territory involving the parasagittal occipital lobe and medial left temporal lobe. Subtle early changes are likely present in retrospect on the prior CT. There is additional involvement of the left thalamus. Minimal involvement of the contralateral right thalamus is also noted. There is no evidence of intracranial hemorrhage. Prominence of the ventricles and sulci reflects generalized parenchymal volume loss. Additional patchy T2 hyperintensity in the supratentorial white matter is nonspecific but may reflect minor chronic microvascular ischemic changes. 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 There is no intracranial mass or significant mass effect. There is no hydrocephalus or extra-axial fluid collection. Vascular: Major vessel flow voids at the skull base are preserved. Skull and upper cervical spine: Normal marrow signal is preserved. Sinuses/Orbits: Paranasal sinuses are aerated. Orbits are unremarkable. Other: Sella is unremarkable.  Mastoid air cells are clear. MRA HEAD Intracranial internal carotid arteries are patent with atherosclerotic irregularity. Middle and anterior cerebral arteries are patent. Intracranial vertebral arteries are patent. Basilar artery is patent. Right posterior cerebral artery is patent. Left P1 PCA is patent. There is occlusion of the left PCA at the P1-P2 junction. No aneurysm. IMPRESSION: Acute large left PCA territory infarction. Left  PCA occlusion at the P1-P2 junction. Small acute right thalamic infarct. No hemorrhage or mass effect. These results were called by telephone at the time of interpretation on 03/12/2021 at 10:18 am to provider [facility] , who verbally acknowledged these results. Electronically Signed   By: Macy Mis M.D.   On: 03/12/2021 10:27   US Carotid Bilateral  Result Date: 03/12/2021 CLINICAL DATA:  Chest pain with shortness of breath EXAM: BILATERAL CAROTID DUPLEX ULTRASOUND TECHNIQUE: Pearline Cables scale imaging, color Doppler and duplex ultrasound were performed of bilateral carotid and vertebral arteries in the neck. COMPARISON:  None. FINDINGS: Criteria: Quantification of carotid stenosis is based on velocity parameters that correlate the residual internal carotid diameter with NASCET-based stenosis levels, using the diameter of the distal internal carotid lumen as the denominator for stenosis measurement. The following velocity measurements were obtained: RIGHT ICA: 122/32 cm/sec CCA: 67/67 cm/sec SYSTOLIC ICA/CCA RATIO:  2.2 ECA: 122 cm/sec LEFT ICA: 105/33 cm/sec CCA: 20/94 cm/sec SYSTOLIC ICA/CCA RATIO:  1.5 ECA: 157 cm/sec RIGHT CAROTID ARTERY: Hypoechoic intimal thickening and mixed plaque formation. Despite this, there is no hemodynamically significant right ICA stenosis, velocity elevation, or turbulent flow. Degree of narrowing less than 50% by ultrasound criteria. RIGHT VERTEBRAL ARTERY:  Normal antegrade flow LEFT CAROTID ARTERY: Similar intimal thickening and mixed echogenicity bifurcation plaque. No hemodynamically significant left ICA stenosis, velocity elevation, or turbulent flow. Degree of narrowing also less than 50% by ultrasound criteria. LEFT VERTEBRAL ARTERY:  Normal antegrade flow IMPRESSION: Bilateral carotid atherosclerosis. No hemodynamically significant ICA stenosis. Degree of narrowing less than 50% bilaterally by ultrasound criteria. Patent antegrade vertebral flow bilaterally  Electronically Signed   By: Jerilynn Mages.  Shick M.D.   On: 03/12/2021 16:13   ECHOCARDIOGRAM COMPLETE BUBBLE STUDY  Result Date: 03/13/2021    ECHOCARDIOGRAM REPORT   Patient Name:   Randall Sullivan Date of Exam: 03/13/2021 Medical Rec #:  [id number]  Height:       70.0 in Accession #:    [id number] Weight:  220.5 lb Date of Birth:  July 21, 1951  BSA:          2.176 m Patient Age:    89 years   BP:           151/82 mmHg Patient Gender: M          HR:           101 bpm. Exam Location:  [facility] Procedure: 2D Echo, Cardiac Doppler and Color Doppler Indications:    Stroke 434.91 / I63.9  History:        Patient has no prior history of Echocardiogram examinations.                 COPD, Arrythmias:Atrial Fibrillation; Risk Factors:Hypertension                 and Dyslipidemia. Cancer of left lung (HCC)-S/P lobectomy of                 lung.  Sonographer:    Alvino Chapel RCS Referring Phys: [id number] [place]  1. Left ventricular ejection fraction, by estimation, is 50 to 55%. The left ventricle has low normal function. Left ventricular endocardial border not optimally defined to evaluate regional wall motion. Left ventricular diastolic parameters are consistent with Grade I diastolic dysfunction (impaired relaxation).  2. Right ventricular systolic function is normal. The right ventricular size is normal.  3. The mitral valve is normal in structure. No evidence of mitral valve regurgitation. No evidence of mitral stenosis.  4. The aortic valve is tricuspid. Aortic valve regurgitation is not visualized. No aortic stenosis is present.  5. The inferior vena cava is normal in size with greater than 50% respiratory variability, suggesting right atrial pressure of 3 mmHg.  6. Agitated saline contrast bubble study was negative, with no evidence of any interatrial shunt. FINDINGS  Left Ventricle: Left ventricular ejection fraction, by estimation, is 50 to 55%. The left ventricle has low normal function. Left  ventricular endocardial border not optimally defined to evaluate regional wall motion. The left ventricular internal cavity  size was normal in size. There is no left ventricular hypertrophy. Left ventricular diastolic parameters are consistent with Grade I diastolic dysfunction (impaired relaxation). Normal left ventricular filling pressure. Right Ventricle: The right ventricular size is normal. No increase in right ventricular wall thickness. Right ventricular systolic function is normal. Left Atrium: Left atrial size was normal in size. Right Atrium: Right atrial size was normal in size. Pericardium: There is no evidence of pericardial effusion. Mitral Valve: The mitral valve is normal in structure. No evidence of mitral valve regurgitation. No evidence of mitral valve stenosis. Tricuspid Valve: The tricuspid valve is normal in structure. Tricuspid valve regurgitation is not demonstrated. No evidence of tricuspid stenosis. Aortic Valve: The aortic valve is tricuspid. Aortic valve regurgitation is not visualized. No aortic stenosis is present. Aortic valve mean gradient measures 2.8 mmHg. Aortic valve peak gradient measures 5.5 mmHg. Aortic valve area, by VTI measures 3.42 cm. Pulmonic Valve: The pulmonic valve was not well visualized. Pulmonic valve regurgitation is not visualized. No evidence of pulmonic stenosis. Aorta: The aortic root is normal in size and structure. Pulmonary Artery: Indeterminant PASP, inadequate TR jet. Venous: The inferior vena cava is normal in size with greater than 50% respiratory variability, suggesting right atrial pressure of 3 mmHg. IAS/Shunts: No atrial level shunt detected by color flow Doppler. Agitated saline contrast was given intravenously to evaluate for intracardiac shunting. Agitated saline contrast bubble study  was negative, with no evidence of any interatrial shunt.  LEFT VENTRICLE PLAX 2D LVIDd:         4.50 cm  Diastology LVIDs:         3.20 cm  LV e' medial:    5.55  cm/s LV PW:         1.00 cm  LV E/e' medial:  10.6 LV IVS:        1.10 cm  LV e' lateral:   5.00 cm/s LVOT diam:     2.10 cm  LV E/e' lateral: 11.7 LV SV:         63 LV SV Index:   29 LVOT Area:     3.46 cm  RIGHT VENTRICLE RV S prime:     10.30 cm/s TAPSE (M-mode): 1.9 cm LEFT ATRIUM             Index       RIGHT ATRIUM           Index LA diam:        2.80 cm 1.29 cm/m  RA Area:     13.50 cm LA Vol (A2C):   45.9 ml 21.10 ml/m RA Volume:   34.90 ml  16.04 ml/m LA Vol (A4C):   36.0 ml 16.55 ml/m LA Biplane Vol: 40.5 ml 18.62 ml/m  AORTIC VALVE AV Area (Vmax):    3.27 cm AV Area (Vmean):   3.08 cm AV Area (VTI):     3.42 cm AV Vmax:           117.75 cm/s AV Vmean:          77.410 cm/s AV VTI:            0.185 m AV Peak Grad:      5.5 mmHg AV Mean Grad:      2.8 mmHg LVOT Vmax:         111.00 cm/s LVOT Vmean:        68.800 cm/s LVOT VTI:          0.183 m LVOT/AV VTI ratio: 0.99  AORTA Ao Root diam: 3.90 cm MITRAL VALVE MV Area (PHT): 4.33 cm     SHUNTS MV Decel Time: 175 msec     Systemic VTI:  0.18 m MV E velocity: 58.70 cm/s   Systemic Diam: 2.10 cm MV A velocity: 109.00 cm/s MV E/A ratio:  0.54 Carlyle Dolly MD Electronically signed by Carlyle Dolly MD Signature Date/Time: 03/13/2021/4:38:25 PM    Final     Labs:  Basic Metabolic Panel: Recent Labs  Lab 03/20/21 0509  NA 138  K 3.9  CL 96*  CO2 30  GLUCOSE 110*  BUN 18  CREATININE 1.35*  CALCIUM 9.4    CBC: Recent Labs  Lab 03/20/21 0509  WBC 7.4  NEUTROABS 4.7  HGB 13.6  HCT 41.8  MCV 85.1  PLT 261    CBG: Recent Labs  Lab 03/21/21 2107 03/22/21 0607 03/22/21 1141 03/22/21 1637 03/22/21 2124  GLUCAP 101* 103* 93 107* 94   Family history.  Mother with cancer Sister with brain aneurysm Brother with asthma and lung cancer.  Denies any colon cancer esophageal cancer or rectal cancer  Brief HPI:   Randall Sullivan is a 70 y.o. right-handed male with history of PAF maintained on aspirin, lung cancer with VATS procedure  12/21/2018 on the right and VATS 08/02/2019 on the left, emphysema on chronic O2 as well as tobacco use obesity with BMI 31.63 hypertension hyperlipidemia.  Patient lives with spouse independent community ambulator.  1 level home 3 steps to entry.  Presented to [facility] 03/12/2021 with acute onset of right side weakness aphasia and nonradiating chest pain with shortness of breath.  CT/MRI/MRI of the head showed acute left large PCA territory infarction.  Left PCA occlusion at the P1 P2 junction.  Small acute right thalamic infarction.  No hemorrhage or mass-effect.  Patient did not receive tPA.  Carotid Dopplers with no ICA stenosis.  Chest x-ray no active disease.  Echocardiogram with ejection fraction of 50 to 10% grade 1 diastolic dysfunction.  Admission chemistries alcohol negative sodium 134 potassium 3.4 creatinine 1.04 MRSA PCR screening negative urine drug screen negative hemoglobin A1c 7.5.  Currently maintained on aspirin 325 mg for CVA prophylaxis awaiting plan to transition to Eliquis versus Coumadin pending insurance.  Subcutaneous Lovenox for DVT prophylaxis.  With noted new findings diabetes mellitus hemoglobin A1c 7.5 sliding scale insulin was added.  Therapy evaluations completed due to patient's right side weakness was admitted for a comprehensive rehab program.   Hospital Course: Randall Sullivan was admitted to rehab 03/14/2021 for inpatient therapies to consist of PT, ST and OT at least three hours five days a week. Past admission physiatrist, therapy team and rehab RN have worked together to provide customized collaborative inpatient rehab.  Pertaining to patient's left PCA territory infarction small acute right thalamic infarction remained stable follow-up neurology services.  Patient transition to Coumadin therapy for CVA prophylaxis history of PAF.  Patient was denied use of Eliquis due to insurance constraints.  Cardiac rate controlled.  Follow-up cardiology services.  Pain managed with  use of Fioricet as needed for mild headaches.  Blood pressure heart rate controlled monitored continued on Toprol as well as Cardizem.  No chest pain or shortness of breath.  Noted history of tobacco abuse received counsel regarding cessation of nicotine products.  History of lung cancer with COPD status post VATS procedure chronic O2 was directed.  Obesity BMI 31.63 dietary follow-up.  Mild AKI monitored latest creatinine 1.35.  Noted new findings diabetes mellitus hemoglobin A1c 7.5 maintained on Amaryl with diabetic teaching completed.   Blood pressures were monitored on TID basis and controlled  Diabetes has been monitored with ac/hs CBG checks and SSI was use prn for tighter BS control.    Rehab course: During patient's stay in rehab weekly team conferences were held to monitor patient's progress, set goals and discuss barriers to discharge. At admission, patient required min mod assist 70 feet rolling walker minimal assist stand pivot transfers supervision grooming minimal assist lower body bathing supervision upper body dressing minimal assist lower body dressing  Physical exam.  Blood pressure 157/97 pulse 91 temperature 98.4 respirations 19 oxygen saturations 96% room air. Neurologic.  Alert expressive receptive aphasia he does use some short word phrases and can provide his name but had difficulty with age place and date of birth. HEENT Head.  Normocephalic and atraumatic Eyes.  Pupils round and reactive to light no discharge.nystagmus Neck.  Supple nontender no JVD without thyromegaly Cardiac irregular regular Abdomen.  Soft nontender positive bowel sounds without rebound Respiratory effort normal no respiratory distress without wheeze Neurologic.  Cranial nerves II through XII intact motor strength 4+/5 in the right 5/5 left deltoid bicep tricep grip hip flexor knee extensor ankle dorsi plantarflexion.  Right homonymous hemianopsia.  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  He/She  has had improvement in activity tolerance, balance, postural control as well  as ability to compensate for deficits. He/She has had improvement in functional use RUE/LUE  and RLE/LLE as well as improvement in awareness.  Perform supine to sit supervision modified independent.  Ambulates 120 feet rolling walker close supervision.  Toilet transfers performed supervision.  Contact-guard assist for standing balance for urination.  Tub bench transfers contact-guard assist.  Ambulates and now the bathroom rolling walker contact-guard assist.  Speech therapy follow-up for aphasia facilitated basic problem-solving skills eating max cues.  Was discussed with family need for supervision for safety.  Family teaching completed discharge to home       Disposition: Discharged home    Diet: Carb modified  Special Instructions: No driving smoking or alcohol  Continue oxygen therapy as prior to admission.  Home health nurse to check INR 03/27/2021 results to [facility] [phone] fax [phone]  Medications at discharge 1.  Tylenol as needed 2.  Fioricet 1 tablet every 6 hours as needed headache 3.  Vitamin D 5000 units p.o. daily 4.  Cardizem CD 120 mg daily 5.  Fenofibrate 160 mg daily 6.  Amaryl 1 mg p.o. with breakfast 7.  Claritin 10 mg p.o. daily 8.  Melatonin 6 mg p.o. nightly 9.  Toprol-XL 50 mg p.o. daily 10.  Pravachol 40 mg p.o. daily 12.  Coumadin daily with latest dose 5 mg with goal INR 2.0-3.0 13.  Trazodone 25 mg nightly 14.  Topamax 25 mg twice daily  30-35 minutes were spent completing discharge summary and discharge planning  Discharge Instructions     Ambulatory referral to Neurology   Complete by: As directed    An appointment is requested in approximately: 4 weeks left PCA right thalamic infarction   Ambulatory referral to Physical Medicine Rehab   Complete by: As directed    Moderate complexity follow-up 1 to 2 weeks left  PCA/right thalamic infarction        Follow-up Information     Kirsteins, Luanna Salk, MD Follow up.   Specialty: Physical Medicine and Rehabilitation Why: Office to call for appointment Contact information: [place] [address] [phone]         Minus Breeding, MD Follow up.   Specialty: Cardiology Why: Call for appointment as needed Contact information: [place] [address] [phone]                 Signed: Lavon Paganini Wishek 03/23/2021, 5:26 AM

## 2021-03-21 NOTE — Patient Care Conference (Signed)
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 Alysia Penna Care Coodinator Present: Dorien Chihuahua, RN, BSN, CRRN;Christina Kinsman, [place] Nurse Present: Dorien Chihuahua, RN PT Present: Barrie Folk, PT OT Present: Other (comment) Providence Lanius, OT) [place] Coordinator present : Gunnar Fusi, SLP     Current Status/Progress Goal Weekly Team Focus  Bowel/Bladder             Swallow/Nutrition/ Hydration             ADL's   S for grooming, UBD, CGA for bathing at shower level, toileting, transfers with RW; min A for LBD + socks/shoes  S for toilet/shower transfers/bathing, mod I dressing, grooming  vision compensatory strategies, RUE coordination, general strengthening, balance, Jessie, memory activities   Mobility   bed mobility = mod I; Transfers = supervision/ mod I; gait = supervision with RW; stairs = CGA/ supervision  Bed mobility/ transfers at Mod I; Gait and stairs at supervision  Gait without AD, improved cardioresp endurance, Stair training, balance,   Communication   sentence level/conversation min A word finding, confrontation task sup A cues  Supervision A  word finding, thoughts formation, 2 step commands   Safety/Cognition/ Behavioral Observations  max A 2 step/novel problem solving  task, mod A  Min-sueprvision A  basic problem solving, error awareness, recall and orientation (impacted by word finding/recall)   Pain             Skin               Discharge Planning:  d/c home with spouse at [place] A level, 24/7   Team Discussion: Eliquis changed to Coumadin due to cost. MD discontinue oxygen.  Mild aphasia and visual field cut.  Patient on target to meet rehab goals: yes, currently super vision for upper body care and CGA for shower level activities and toileting. Supervision for transfers with a rolling walker. Intermittent assist when using a walker and manages steps with CGA - close supervision due to safety concerns.  Supervision - mod I goals set for discharge with mod I for B+D. SLP set supervision for comprehension and cognition goals.  *See Care Plan and progress notes for long and short-term goals.   Revisions to Treatment Plan:  SLP to complete reassessment with standardized testing tools  Working on 2 step commands, work finding and awareness with problem solving   Teaching Needs: Review safety measures with family for shower and toileting, medications, secondary risk management, etc  Current Barriers to Discharge: Decreased caregiver support and Home enviroment access/layout  Possible Resolutions to Barriers: Family education     Medical Summary Current Status: transitioning from Eliquis to warfarin, dietary teaching  Barriers to Discharge: Medical stability   Possible Resolutions to [employer]  Focus: Pharmacy assist for Warfarin transition, dietary ed   Continued Need for Acute Rehabilitation Level of Care: The patient requires daily medical management by a physician with specialized training in physical medicine and rehabilitation for the following reasons: Direction of a multidisciplinary physical rehabilitation program to maximize functional independence : Yes Medical management of patient stability for increased activity during  participation in an intensive rehabilitation regime.: Yes Analysis of laboratory values and/or radiology reports with any subsequent need for medication adjustment and/or medical intervention. : Yes   I attest that I was present, lead the team conference, and concur with the assessment and plan of the team.   Dorien Chihuahua B 03/21/2021, 12:29 PM

## 2021-03-22 LAB — PROTIME-INR
INR: 1.3 — ABNORMAL HIGH (ref 0.8–1.2)
Prothrombin Time: 16.5 seconds — ABNORMAL HIGH (ref 11.4–15.2)

## 2021-03-22 LAB — GLUCOSE, CAPILLARY
Glucose-Capillary: 103 mg/dL — ABNORMAL HIGH (ref 70–99)
Glucose-Capillary: 93 mg/dL (ref 70–99)
Glucose-Capillary: 107 mg/dL — ABNORMAL HIGH (ref 70–99)
Glucose-Capillary: 94 mg/dL (ref 70–99)

## 2021-03-22 MED ORDER — FENOFIBRATE 145 MG PO TABS: 145.0000 mg | ORAL_TABLET | Freq: Every day | ORAL | 1 refills | Status: DC

## 2021-03-22 MED ORDER — LORATADINE 10 MG PO TABS: 10.0000 mg | ORAL_TABLET | Freq: Every day | ORAL | 0 refills | Status: DC

## 2021-03-22 MED ORDER — VITAMIN D3 125 MCG (5000 UT) PO CAPS: 1.0000 | ORAL_CAPSULE | Freq: Every day | ORAL | 0 refills | Status: DC

## 2021-03-22 MED ORDER — DILTIAZEM HCL ER COATED BEADS 120 MG PO CP24: 120.0000 mg | ORAL_CAPSULE | Freq: Every day | ORAL | 0 refills | Status: DC

## 2021-03-22 MED ORDER — TRAZODONE HCL 50 MG PO TABS: 25.0000 mg | ORAL_TABLET | Freq: Every day | ORAL | 0 refills | Status: DC

## 2021-03-22 MED ORDER — POLYETHYLENE GLYCOL 3350 17 G PO PACK: 17.0000 g | PACK | Freq: Every day | ORAL | 0 refills | Status: DC | PRN

## 2021-03-22 MED ORDER — BUTALBITAL-APAP-CAFFEINE 50-325-40 MG PO TABS: 1.0000 | ORAL_TABLET | Freq: Four times a day (QID) | ORAL | 0 refills | Status: DC | PRN

## 2021-03-22 MED ORDER — TOPIRAMATE 25 MG PO TABS
25.0000 mg | ORAL_TABLET | Freq: Two times a day (BID) | ORAL | Status: DC
  Administered 2021-03-22 – 2021-03-23 (×3): 25 mg via ORAL
  Filled 2021-03-22 (×3): qty 1

## 2021-03-22 MED ORDER — WARFARIN SODIUM 5 MG PO TABS
10.0000 mg | ORAL_TABLET | Freq: Once | ORAL | Status: AC
  Administered 2021-03-22: 10 mg via ORAL
  Filled 2021-03-22: qty 2

## 2021-03-22 MED ORDER — GLIMEPIRIDE 1 MG PO TABS: 1.0000 mg | ORAL_TABLET | Freq: Every day | ORAL | 0 refills | Status: DC

## 2021-03-22 MED ORDER — METOPROLOL SUCCINATE ER 50 MG PO TB24: 50.0000 mg | ORAL_TABLET | Freq: Every day | ORAL | 1 refills | Status: DC

## 2021-03-22 MED ORDER — PRAVASTATIN SODIUM 40 MG PO TABS: 40.0000 mg | ORAL_TABLET | Freq: Every day | ORAL | 0 refills | Status: DC

## 2021-03-22 MED ORDER — ACETAMINOPHEN 325 MG PO TABS: 650.0000 mg | ORAL_TABLET | Freq: Four times a day (QID) | ORAL | Status: DC | PRN

## 2021-03-22 MED ORDER — MELATONIN 3 MG PO TABS: 6.0000 mg | ORAL_TABLET | Freq: Every day | ORAL | 0 refills | Status: DC

## 2021-03-22 NOTE — Progress Notes (Signed)
Patient ID: Randall Sullivan, male   DOB: 1951/05/26, 70 y.o.   MRN: [id number]  Shower chair ordered through [place].   [place], [place]

## 2021-03-22 NOTE — Progress Notes (Signed)
ANTICOAGULATION CONSULT NOTE - Initial Consult  Pharmacy Consult for Coumadin Indication: atrial fibrillation  Allergies  Allergen Reactions   No Known Allergies     Patient Measurements: Height: 5\' 10"  (177.8 cm) Weight: 97.6 kg (215 lb 2.7 oz) IBW/kg (Calculated) : 73  Vital Signs: Temp: 98.4 F (36.9 C) (06/23 0433) Temp Source: Oral (06/23 0433) BP: 110/75 (06/23 0433) Pulse Rate: 79 (06/23 0433)  Labs: Recent Labs    03/20/21 0509 03/20/21 1038 03/21/21 0505 03/22/21 0418  HGB 13.6  --   --   --   HCT 41.8  --   --   --   PLT 261  --   --   --   LABPROT  --  14.4 14.8 16.5*  INR  --  1.1 1.2 1.3*  CREATININE 1.35*  --   --   --      Estimated Creatinine Clearance: 58.8 mL/min (A) (by C-G formula based on SCr of 1.35 mg/dL (H)).   Medical History: Past Medical History:  Diagnosis Date   A-fib ([place]) 2017   after last lung surgery patient had a history if Afib documented in hospital    Allergy    Arthritis    back & legs ( knees)   Cancer ([place])    lung   Complication of anesthesia    agitated upon waking from anesth.    Emphysema    History of lung cancer    Hyperlipidemia    Hypertension    Oxygen dependent    2L continuous   Tobacco abuse     Assessment:  Anticoag: Afib, not on anticoag PTA. Started Eliquis 6/20 (got 3 doses), no insurance, changed to warfarin 6/21. Hgb 13.6. Plts 261. CHADS2VASC: at least 4 - 6/21: INR 1.3  Goal of Therapy:  Therapeutic oral anticoagulation   Plan:  Coumadin 10 mg po x 1 tonight. Lovenox 40mg /d for bridging until INR>2. Daily INR    Baptiste Littler S. Alford Highland, PharmD, BCPS Clinical Staff Pharmacist [email or web address] Alford Highland, [place] 03/22/2021,9:00 AM

## 2021-03-22 NOTE — Progress Notes (Signed)
Occupational Therapy Session Note  Patient Details  Name: Randall Sullivan MRN: [id number] Date of Birth: 1951/05/08  Today's Date: 03/22/2021 OT Individual Time: 8916-9450 OT Individual Time Calculation (min): 90 min    Short Term Goals: Week 1:  OT Short Term Goal 1 (Week 1): Pt will complete LB dressing with CGA and AE prn. OT Short Term Goal 2 (Week 1): Pt will complete grooming and oral care standing sink side with SUP. OT Short Term Goal 3 (Week 1): Pt will be able to manage all aspects of footwear with supervision and AE prn.   Skilled Therapeutic Interventions/Progress Updates:    Pt sitting at EOB with wife present at bedside.  No c/o pain.  Discussed with pt and wife regarding bathroom layout and determined shower bench will not fit.  Pts wife reports they dont own any DME currently.  Pt requesting to use bathroom. Pt ambulated EOB to toilet without AD requiring CGA.  Pt stood at toilet and urinated/managed clothing with supervision.  Pt ambulated to sink and required occasional Vcs to locate soap and paper towels to wash and dry hands.  Pt transported to ADL bathroom via w/c for time management.  Pt ambulated from doorway to tub and stepped over tub lip using suction cup grab bar with CGA.  Stand to sit at shower chair with CGA.  Pt doffed shirt with mod I and supervision needed for safety cues during doffing of shorts.  Pt bathed UB and LB with supervision needing min Vcs to locate soap on right side and problem solving setting water temperature.  Stepped out of tub with CGA using grab bar and stand to sit at w/c with CGA.  Pt donned shirt with mod I and shorts with supervision.    Pt transported back to room via w/c for time mgt.  Instructed pt on resistive BUE HEP to strengthen shoulder.  Assessed MMT BUE noting 4/5 right shoulder all planes and 5/5 all other joints on right side including grip.  Pt completed 1 x 15 rep shoulder horizontal abd/add, ER at side, and shoulder scaption using  level 3 band.  HEP handout provided and reviewed for improved carryover at home.    Call bell in reach, seat alarm on at end of session.  Therapy Documentation Precautions:  Precautions Precautions: Fall Precaution Comments: mild R sided hemipareisis Restrictions Weight Bearing Restrictions: No   Therapy/Group: Individual Therapy  Ezekiel Slocumb 03/22/2021, 12:47 PM

## 2021-03-22 NOTE — Progress Notes (Signed)
Notified by therapy  that patient was more short of breath with exercises. Prn neb tx order via prn order and on call provider notified. New order initiated as ordered. Will continue to monitor.

## 2021-03-22 NOTE — Plan of Care (Signed)
  Problem: RH Furniture Transfers Goal: LTG Patient will perform furniture transfers w/assist (OT/PT) Description: LTG: Patient will perform furniture transfers  with assistance (OT/PT). Outcome: Completed/Met   Problem: RH Grooming Goal: LTG Patient will perform grooming w/assist,cues/equip (OT) Description: LTG: Patient will perform grooming with assist, with/without cues using equipment (OT) Outcome: Completed/Met   Problem: RH Bathing Goal: LTG Patient will bathe all body parts with assist levels (OT) Description: LTG: Patient will bathe all body parts with assist levels (OT) Outcome: Completed/Met   Problem: RH Dressing Goal: LTG Patient will perform upper body dressing (OT) Description: LTG Patient will perform upper body dressing with assist, with/without cues (OT). Outcome: Completed/Met   Problem: RH Toileting Goal: LTG Patient will perform toileting task (3/3 steps) with assistance level (OT) Description: LTG: Patient will perform toileting task (3/3 steps) with assistance level (OT)  Outcome: Completed/Met   Problem: RH Light Housekeeping Goal: LTG Patient will perform light housekeeping w/assist (OT) Description: LTG: Patient will perform light housekeeping with assistance, with/without cues (OT). Outcome: Completed/Met

## 2021-03-22 NOTE — Plan of Care (Signed)
  Problem: RH Balance Goal: LTG: Patient will maintain dynamic sitting balance (OT) Description: LTG:  Patient will maintain dynamic sitting balance with assistance during activities of daily living (OT) Outcome: Not Met (add Reason) Flowsheets (Taken 03/22/2021 1532) LTG: Pt will maintain dynamic sitting balance during ADLs with: (Not met as pt cont to req S for safety 2/2 cognitive, visual, and balance deficits.) --   Problem: RH Dressing Goal: LTG Patient will perform lower body dressing w/assist (OT) Description: LTG: Patient will perform lower body dressing with assist, with/without cues in positioning using equipment (OT) Outcome: Not Met (add Reason) Flowsheets (Taken 03/22/2021 1532) LTG: Pt will perform lower body dressing with assistance level of: (Not met as pt cont to req S for safety 2/2 cognitive, visual, and balance deficits.) -- Note: Not met as pt cont to req S for safety 2/2 cognitive, visual, and balance deficits.   Problem: RH Toilet Transfers Goal: LTG Patient will perform toilet transfers w/assist (OT) Description: LTG: Patient will perform toilet transfers with assist, with/without cues using equipment (OT) Outcome: Not Met (add Reason) Flowsheets (Taken 03/22/2021 1532) LTG: Pt will perform toilet transfers with assistance level of: (Not met as pt cont to req S for safety 2/2 cognitive, visual, and balance deficits.) -- Note: Not met as pt cont to req S for safety 2/2 cognitive, visual, and balance deficits.   Problem: RH Tub/Shower Transfers Goal: LTG Patient will perform tub/shower transfers w/assist (OT) Description: LTG: Patient will perform tub/shower transfers with assist, with/without cues using equipment (OT) Outcome: Not Met (add Reason) Flowsheets (Taken 03/22/2021 1532) LTG: Pt will perform tub/shower stall transfers with assistance level of: (Not met as pt cont to req S for safety 2/2 cognitive, visual, and balance deficits.) -- Note: Not met as pt cont to  req S for safety 2/2 cognitive, visual, and balance deficits.

## 2021-03-22 NOTE — Telephone Encounter (Signed)
Yes sir. I called Mrs Randall Sullivan back. So I needed her information for [facility] to contact. But looks like they have not sent over her new forms. I do Have letter up front that you created to sent to them. I got a call for the forms was going to be faxed to update to [facility].

## 2021-03-22 NOTE — Telephone Encounter (Signed)
Randall Sullivan called and left a detailed voice message that Randall Sullivan called her and stated that they never received the FMLA paperwork for Randall Sullivan for her FMLA and this is time sensative and her leave is not approved yet. Randall Sullivan is asking for a call back at [phone].

## 2021-03-22 NOTE — Telephone Encounter (Signed)
Can you help with this?

## 2021-03-22 NOTE — Progress Notes (Signed)
Physical Therapy Discharge Summary  Patient Details  Name: Randall Sullivan MRN: [id number] Date of Birth: 1951/09/04  Today's Date: 03/22/2021 PT Individual Time: 9476-5465 PT Individual Time Calculation (min): 68 min    Patient has met 8 of 10 long term goals due to improved activity tolerance, improved balance, increased strength, ability to compensate for deficits, functional use of  right upper extremity and right lower extremity, improved attention, improved awareness, and improved coordination.  Patient to discharge at an ambulatory level using RW and Supervision.   Patient's care partner is independent to provide the necessary physical and cognitive assistance at discharge.  Reasons goals not met: Pt continues to require verbal and tactile cues for bed to chair transfers due to right visual field cut and right inattention. Pt scored 31/56 on the Berg Balance during session, not meeting his goal of >45/56.  Recommendation:  Patient will benefit from ongoing skilled PT services in [facility] to continue to advance safe functional mobility, address ongoing impairments in balance, coordination, awareness of right visual field cut during functional tasks, endurance, gait training with LRAD, and minimize fall risk.  Equipment: RW   Reasons for discharge: treatment goals met and discharge from hospital  Patient/family agrees with progress made and goals achieved: Yes  Skilled Therapeutic Interventions/Progress Updates: Pt noted to be turning left in bed to achieve right side-lying position, demonstrating right inattention, at the start of session. Pt consents to PT. Pt ambulated in hall to 4W with supervision assistance with no AD for ~148ft. Increased to CGA last ~73ft prior to requiring a rest break secondary to pt shifting attention to SOB. Desat to 80% with ambulation, however, quickly returned to 94% with rest break and pursed lip breathing technique within minutes. Vitals monitored throughout  session. Pt ambulated another 86ft to gym with CGA, no AD.  Berg balance was performed with the patient scoring 31/56 (A score of <45 indicates the patient is at an increased risk for falls. Education provided to the patient on the interpretation of balance score.) Pt required frequent rest breaks secondary to "feeling winded" and "tired" with verbal and visual cuing for pursed lip breathing. O2 90-95% during Berg Balance assessment. Notified RN of patient's greater than typical SOB with activity and SpO2 readings. Supplemental O2 placed on 1-2L secondary to slight discoloration in face and pt stating "I can't breathe" and "Man, I am having a hard time breathing" while ambulating after Berg. Pt reports feeling better with the supplemental O2 and O2 returned to 100%.  Frequent verbal, visual, and tactile cuing required for attention to the RLE, spatial awareness to the right with functional mobility and transfers to the right.  Pt left in wheelchair with seatbelt alarm on and call bell within reach. Pt requesting breathing treatment. RN notified.    PT Discharge Precautions/Restrictions Precautions Precautions: Fall Precaution Comments: mild R sided hemipareisis Restrictions Weight Bearing Restrictions: No Vital Signs  Please see above; RN Advised. Pain Pain Assessment Pain Scale: 0-10 Pain Score: 0-No pain Faces Pain Scale: No hurt Vision/Perception  Vision - Assessment Eye Alignment: Impaired (comment) Perception Perception: Impaired Comments: large R visual field cut Praxis Praxis: Intact  Cognition Overall Cognitive Status: Impaired/Different from baseline Arousal/Alertness: Lethargic (pt appears to be legthargic and take frequent cuing to maintain focus to task secondary to SOB) Orientation Level: Oriented to person;Oriented to place Awareness: Impaired Awareness Impairment: Emergent impairment;Anticipatory impairment Problem Solving: Impaired Problem Solving Impairment:  Functional basic;Verbal basic Behaviors: Impulsive Safety/Judgment: Impaired Comments: Pt requires frequent cuing  for safety awareness with all functional mobility and transfer. Sensation Sensation Light Touch: Appears Intact Coordination Gross Motor Movements are Fluid and Coordinated: No Fine Motor Movements are Fluid and Coordinated: No (mild R hemi) Coordination and Movement Description: mild R hemi RUE> RLE Finger Nose Finger Test: Slight overshooting with ataxic movement and increased time for task RUE Heel Shin Test: Slight overshooting and increased time for RLE Motor  Motor Motor: Hemiplegia Motor - Discharge Observations: improving dysmetria, unobservable ataxia from eval  Mobility Bed Mobility Bed Mobility: Supine to Sit;Sit to Supine Supine to Sit: Independent with assistive device (HOB elevated) Sit to Supine: Independent with assistive device Transfers Transfers: Sit to Stand;Stand to Sit;Stand Pivot Transfers Sit to Stand: Independent Stand to Sit: Supervision/Verbal cueing Stand Pivot Transfers: Supervision/Verbal cueing Stand Pivot Transfer Details: Verbal cues for precautions/safety Transfer (Assistive device): Rolling walker Locomotion  Gait Ambulation: Yes Gait Assistance: Supervision/Verbal cueing Gait Distance (Feet): 150 Feet Assistive device: Rolling walker Gait Assistance Details: Verbal cues for precautions/safety;Verbal cues for technique Gait Gait: Yes Gait Pattern: Impaired Gait Pattern: Poor foot clearance - right;Decreased step length - right;Step-through pattern Gait velocity: decreased Stairs / Additional Locomotion Stairs: Yes Stairs Assistance: Supervision/Verbal cueing Stair Management Technique: One rail Left Wheelchair Mobility Wheelchair Mobility: No  Trunk/Postural Assessment  Cervical Assessment Cervical Assessment: Exceptions to [facility] (forward head) Thoracic Assessment Thoracic Assessment: Exceptions to [facility] (rounded  shoulders) Lumbar Assessment Lumbar Assessment: Exceptions to [facility] (posterior pelvic tilt) Postural Control Postural Control: Within Functional Limits  Balance Balance Balance Assessed: Yes Berg Balance Test Sit to Stand: Able to stand without using hands and stabilize independently Standing Unsupported: Able to stand 2 minutes with supervision Sitting with Back Unsupported but Feet Supported on Floor or Stool: Able to sit safely and securely 2 minutes Stand to Sit: Controls descent by using hands Transfers: Able to transfer safely, definite need of hands Standing Unsupported with Eyes Closed: Able to stand 10 seconds with supervision Standing Ubsupported with Feet Together: Needs help to attain position and unable to hold for 15 seconds From Standing, Reach Forward with Outstretched Arm: Can reach confidently >25 cm (10") From Standing Position, Pick up Object from Floor: Able to pick up shoe, needs supervision From Standing Position, Turn to Look Behind Over each Shoulder: Needs supervision when turning Turn 360 Degrees: Needs assistance while turning Standing Unsupported, Alternately Place Feet on Step/Stool: Able to complete >2 steps/needs minimal assist Standing Unsupported, One Foot in Front: Able to take small step independently and hold 30 seconds Standing on One Leg: Unable to try or needs assist to prevent fall Total Score: 31 Extremity Assessment  RLE Assessment RLE Assessment: Within Functional Limits General Strength Comments: Globally 5/5 MMT LLE Assessment LLE Assessment: Within Functional Limits General Strength Comments: Globally 5/5 MMT    Amiyah Shryock, SPT  03/22/2021, 6:12 PM

## 2021-03-22 NOTE — Progress Notes (Signed)
Inpatient Rehabilitation Care Coordinator Discharge Note  The overall goal for the admission was met for:   Discharge location: Yes, home  Length of Stay: Yes, 9 Days  Discharge activity level: Yes  Home/community participation: Yes  Services provided included: MD, RD, PT, OT, SLP, RN, CM, TR, Pharmacy, Neuropsych, and SW  Financial Services: Medicare  Choices offered to/list presented to:pt and spouse  Follow-up services arranged: Home Health: [facility]  Comments (or additional information): PT OT ST  Patient/Family verbalized understanding of follow-up arrangements: Yes  Individual responsible for coordination of the follow-up plan: [name] [phone]  Confirmed correct DME delivered: Dyanne Iha 03/22/2021    Dyanne Iha

## 2021-03-22 NOTE — Progress Notes (Signed)
Occupational Therapy Discharge Summary  Patient Details  Name: Randall Sullivan MRN: [id number] Date of Birth: 1950-10-26  Patient has met 6 of 10 long term goals due to improved activity tolerance, improved balance, postural control, ability to compensate for deficits, functional use of  RIGHT upper extremity, improved attention, improved awareness, and improved coordination.  Patient to discharge at overall Supervision level.  Patient's care partner is independent to provide the necessary physical and cognitive assistance at discharge.  DC assessment completed based off of chart-review from previous OT sessions.  Reasons goals not met: Goals related to toilet transfers, LBD, and shower transfers not met as set at mod I level as pt cont to req S for safety 2/2 cognitive, visual, and balance deficits.  Recommendation:  Patient will benefit from ongoing skilled OT services in home health setting to continue to advance functional skills in the area of BADL and Reduce care partner burden.  Equipment: Shower chair  Reasons for discharge: treatment goals met and discharge from hospital  Patient/family agrees with progress made and goals achieved: Yes  OT Discharge Precautions/Restrictions  Precautions Precautions: Fall Precaution Comments: mild R sided hemipareisis Restrictions Weight Bearing Restrictions: No ADL ADL Eating: Set up Where Assessed-Eating: Edge of bed Grooming: Modified independent Where Assessed-Grooming: Sitting at sink Upper Body Bathing: Supervision/safety Where Assessed-Upper Body Bathing: Shower Lower Body Bathing: Supervision/safety Where Assessed-Lower Body Bathing: Shower Upper Body Dressing: Modified independent (Device) Where Assessed-Upper Body Dressing: Chair Lower Body Dressing: Supervision/safety Where Assessed-Lower Body Dressing: Edge of bed Toileting: Supervision/safety Where Assessed-Toileting: Glass blower/designer: Close supervision Toilet Transfer  Method: Counselling psychologist: Ambulance person Transfer: Geologist, engineering: Facilities manager: Not assessed Social research officer, government Method: Heritage manager: [employer], Gaffer Baseline Vision/History: Wears glasses Wears Glasses: Reading only Patient Visual Report: Eye fatigue/eye pain/headache;Peripheral vision impairment Vision Assessment?: Yes;Vision impaired- to be further tested in functional context Eye Alignment: Impaired (comment) Ocular Range of Motion: Within Functional Limits Alignment/Gaze Preference: Within Defined Limits Tracking/Visual Pursuits: Able to track stimulus in all quads without difficulty Saccades: Undershoots;Other (comment) (impaired R upper quadrant) Convergence: Impaired (comment) Visual Fields: Right visual field deficit Depth Perception: Overshoots Perception  Perception: Impaired Comments: large R visual field cut Praxis Praxis: Intact Cognition Overall Cognitive Status: Impaired/Different from baseline Arousal/Alertness: Awake/alert Orientation Level: Oriented to person;Oriented to situation Attention: Sustained Sustained Attention: Appears intact Memory: Impaired Memory Impairment: Storage deficit;Retrieval deficit;Decreased recall of new information;Decreased short term memory Awareness: Impaired Awareness Impairment: Emergent impairment;Anticipatory impairment Problem Solving: Impaired Problem Solving Impairment: Functional basic;Verbal basic Safety/Judgment: Impaired Comments: potential literacy deficits PTA exacerbated by CVA, memory deficits Sensation Sensation Light Touch: Appears Intact Hot/Cold: Appears Intact Proprioception: Impaired Detail Stereognosis: Appears Intact Coordination Gross Motor Movements are Fluid and Coordinated: Yes Fine Motor Movements are Fluid and Coordinated: No (mild R hemi) Coordination and Movement  Description: mild R hemi RUE> RLE Finger Nose Finger Test: slight overshooting RUE Motor  Motor Motor: Hemiplegia Motor - Discharge Observations: improving dysmetria, unobservable ataxia from eval Mobility  Bed Mobility Bed Mobility: Supine to Sit;Sit to Supine Supine to Sit: Independent with assistive device Sit to Supine: Independent with assistive device Transfers Sit to Stand: Independent with assistive device Stand to Sit: Independent with assistive device;Supervision/Verbal cueing  Trunk/Postural Assessment  Cervical Assessment Cervical Assessment: Exceptions to [facility] (forward head) Thoracic Assessment Thoracic Assessment: Exceptions to [facility] (rounded shoulders) Lumbar Assessment Lumbar Assessment: Exceptions to [facility] (posterior pelvic tilt) Postural Control Postural  Control: Within Functional Limits  Balance Balance Balance Assessed: Yes Static Sitting Balance Static Sitting - Balance Support: Feet supported Static Sitting - Level of Assistance: 6: Modified independent (Device/Increase time) Dynamic Sitting Balance Dynamic Sitting - Balance Support: Feet supported;During functional activity;Left upper extremity supported Dynamic Sitting - Level of Assistance: 5: Stand by assistance;6: Modified independent (Device/Increase time) Dynamic Sitting Balance - Compensations: S to don socks Static Standing Balance Static Standing - Balance Support: No upper extremity supported Static Standing - Level of Assistance: 5: Stand by assistance Static Standing - Comment/# of Minutes: S Dynamic Standing Balance Dynamic Standing - Balance Support: During functional activity;Bilateral upper extremity supported Dynamic Standing - Level of Assistance: 5: Stand by assistance Dynamic Standing - Comments: S Extremity/Trunk Assessment RUE Assessment RUE Assessment: Within Functional Limits (grossly WFL for ADL) General Strength Comments: 4/5 right shoulder all planes and 5/5 all other joints on  right side including grip RUE Body System: Neuro Brunstrum levels for arm and hand: Arm;Hand Brunstrum level for arm: Stage V Relative Independence from Synergy Brunstrum level for hand: Stage V Independence from basic synergies LUE Assessment LUE Assessment: Within Functional Limits    Volanda Napoleon MS, OTR/L   03/22/2021, 3:31 PM

## 2021-03-22 NOTE — Progress Notes (Signed)
Speech Language Pathology Discharge Summary  Patient Details  Name: Randall Sullivan MRN: [id number] Date of Birth: Dec 07, 1950  Today's Date: 03/22/2021 SLP Individual Time: 0820-0918 SLP Individual Time Calculation (min): 58 min   Skilled Therapeutic Interventions:  Skilled ST services focused on education and cognitive skills. Pt's wife present for education. Pt required max A verbal cues to count basic change from ALFA, however required mod A verbal cues for problem solving and recall skills when steps broken down including use of visual aids. SLP facilitated formal cognitive linguistic assessment SLUMS, only able to complete subsections due to time restriction, pt demonstrated immediate recall of 3/5 words (deficits with storage) ability to add basic numbers in problem solving task, but unable to sequence numbers on clock. SLP provided education including handout for memory strategies, how to increase recall with visualization and association strategies, need for assistance in problem solving error awareness further impacted by right inattention and recall. All questions answered to satisfaction. Pt left in room with wife and bed alarm set.     Patient has met 1 of 5 long term goals.  Patient to discharge at overall Supervision;Min;Mod level.  Reasons goals not met: shorter than expected d/c   Clinical Impression/Discharge Summary:   Pt only met 1 out 5 goals due to short than expect length of stay. Pt demonstrated increase ability to express wants/needs and follow commands/identify items/pictures up to a field of 3. Pt continues with deficits in confrontation word finding of objects in photographs, short term recall, basic problem solving and correcting errors. Pt benefited from skilled St services in order to maximize functional independence and reduce burden of care requiring supervision and continue ST services.  Care Partner:  Caregiver Able to Provide Assistance: Yes  Type of Caregiver  Assistance: Physical;Cognitive  Recommendation:  Home Health SLP;24 hour supervision/assistance  Rationale for SLP Follow Up: Maximize functional communication;Maximize cognitive function and independence;Reduce caregiver burden   Equipment: N/A   Reasons for discharge: Discharged from hospital   Patient/Family Agrees with Progress Made and Goals Achieved: Yes    Christyn Gutkowski  [facility] [place] 03/22/2021, 12:56 PM

## 2021-03-22 NOTE — Progress Notes (Signed)
POX 98% on one liter nasal cannula. O2 removed and will place pt on continuous pulse ox tonight. Pt is showing no signs of respiratory distress, no cyanosis , talking without signs of air hunger speaks in whole sentences. Will continue to monitor pt.

## 2021-03-22 NOTE — Progress Notes (Addendum)
PROGRESS NOTE   Subjective/Complaints:  Off O2, left temporal HA started while on O2,  No new weakness, numbness or speech/cognition issues   ROS:  Pt denies SOB, abd pain, CP, N/V/C/D, and vision changes  Objective:   No results found. Recent Labs    03/20/21 0509  WBC 7.4  HGB 13.6  HCT 41.8  PLT 261     Recent Labs    03/20/21 0509  NA 138  K 3.9  CL 96*  CO2 30  GLUCOSE 110*  BUN 18  CREATININE 1.35*  CALCIUM 9.4      Intake/Output Summary (Last 24 hours) at 03/22/2021 0857 Last data filed at 03/22/2021 0700 Gross per 24 hour  Intake 540 ml  Output 725 ml  Net -185 ml         Physical Exam: Vital Signs Blood pressure 110/75, pulse 79, temperature 98.4 F (36.9 C), temperature source Oral, resp. rate 18, height 5\' 10"  (1.778 m), weight 97.6 kg, SpO2 98 %.  General: No acute distress Mood and affect are appropriate Heart: Regular rate and rhythm no rubs murmurs or extra sounds Lungs: Clear to auscultation, breathing unlabored, no rales or wheezes Abdomen: Positive bowel sounds, soft nontender to palpation, nondistended Extremities: No clubbing, cyanosis, or edema  Skin: No evidence of breakdown, no evidence of rash Neuro: reasonable insight and awareness. Right HH, delays in naming objects. RUE 4/5, RLE 4- to 4/5. LUE and LLE 4+ to 5/5.--motor scores stable.  No sensory findings.   Mild aphasia unchanged  Musculoskeletal: no pain in limbs/trunk Tremor right upper extremity intermittent and positional.  No evidence of spasticity  Assessment/Plan: 1. Functional deficits which require 3+ hours per day of interdisciplinary therapy in a comprehensive inpatient rehab setting. Physiatrist is providing close team supervision and 24 hour management of active medical problems listed below. Physiatrist and rehab team continue to assess barriers to discharge/monitor patient progress toward functional  and medical goals  Care Tool:  Bathing    Body parts bathed by patient: Right arm, Left arm, Abdomen, Chest, Front perineal area, Buttocks, Right upper leg, Left upper leg, Left lower leg, Right lower leg, Face         Bathing assist Assist Level: Contact Guard/Touching assist     Upper Body Dressing/Undressing Upper body dressing   What is the patient wearing?: Pull over shirt    Upper body assist Assist Level: Supervision/Verbal cueing    Lower Body Dressing/Undressing Lower body dressing      What is the patient wearing?: Pants     Lower body assist Assist for lower body dressing: Minimal Assistance - Patient > 75%     Toileting Toileting    Toileting assist Assist for toileting: Contact Guard/Touching assist     Transfers Chair/bed transfer  Transfers assist     Chair/bed transfer assist level: Supervision/Verbal cueing     Locomotion Ambulation   Ambulation assist      Assist level: Supervision/Verbal cueing Assistive device: No Device Max distance: 121ft   Walk 10 feet activity   Assist     Assist level: Supervision/Verbal cueing Assistive device: No Device   Walk 50 feet activity  Assist    Assist level: Supervision/Verbal cueing Assistive device: No Device    Walk 150 feet activity   Assist Walk 150 feet activity did not occur: Safety/medical concerns  Assist level: Supervision/Verbal cueing Assistive device: No Device    Walk 10 feet on uneven surface  activity   Assist Walk 10 feet on uneven surfaces activity did not occur: Safety/medical concerns         Wheelchair     Assist Will patient use wheelchair at discharge?: No   Wheelchair activity did not occur: N/A  Wheelchair assist level: Minimal Assistance - Patient > 75%      Wheelchair 50 feet with 2 turns activity    Assist    Wheelchair 50 feet with 2 turns activity did not occur: N/A   Assist Level: Minimal Assistance - Patient > 75%    Wheelchair 150 feet activity     Assist  Wheelchair 150 feet activity did not occur: N/A   Assist Level: Minimal Assistance - Patient > 75%   Blood pressure 110/75, pulse 79, temperature 98.4 F (36.9 C), temperature source Oral, resp. rate 18, height 5\' 10"  (1.778 m), weight 97.6 kg, SpO2 98 %.  Medical Problem List and Plan: 1.  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             -patient may  shower             -ELOS/Goals: 12-14d, Supervision ADL and Mobility - .   -Continue CIR- PT, OT and SLP 2.  Antithrombotics: -DVT/anticoagulation: Lovenox             -antiplatelet therapy: Aspirin 325 mg daily, plan was transition to ELIQUIS 5 mg BID 03/19/2021 however cost is an issue per neuro okay to resume warfarin  Appreciate TOC consult tno assistance - resume warfarin 3. Pain Management: Fioricet as needed headache.   6/20- Pain controlled- con't regimen 4. Mood/sleep: Melatonin 6 mg nightly  6/16 added trazodone at 9pm scheduled  6/17--reduce to 25mg  qhs given hangover effect at 50mg  dose               -antipsychotic agents: N/A 5. Neuropsych: This patient is not capable of making decisions on his own behalf. 6. Skin/Wound Care: Routine skin checks 7. Fluids/Electrolytes/Nutrition: encouarge po  -I personally reviewed the patient's labs today. -BUN/Cr remain elevated--continue to encourage fluids   -recheck labs Monday 6/20- labs not drawn- reordered for tomorrow 8.  PAF.  Toprol-XL 50 mg daily, Cardizem 120 mg twice daily.  Cardiac rate remains controlled  6/20- rate controlled- con't regimen Vitals:   03/21/21 1934 03/22/21 0433  BP: 127/77 110/75  Pulse: 75 79  Resp: 19 18  Temp: 98.6 F (37 C) 98.4 F (36.9 C)  SpO2: 94% 98%   Controlled HR As per neurology okay to resume warfarin 9.  Hyperlipidemia.  Fenofibrate 160 mg daily/Pravachol 10.  History of lung  cancer/COPD.  Status post VATS.  Follow-up outpatient.  d/c O2 not using at home although he had a tank 11.  Tobacco abuse.  Counseling 12.  New onset diabetes mellitus.  Hemoglobin A1c 7.5.  SSI.  Consider metformin.  CBG (last 3)  Recent Labs    03/21/21 1633 03/21/21 2107 03/22/21 0607  GLUCAP 102* 101* 103*     6/23  BG controlled- con't regimen 13.  Obesity.  BMI 31.63.  Dietary follow-up 14. Prerenal  azotemia, mild baseline CKD?  BMET stable   15.  Vascular HA post CVA , no onew neuro deficits start topiramate  LOS: 8 days A FACE TO FACE EVALUATION WAS PERFORMED  Charlett Blake 03/22/2021, 8:57 AM

## 2021-03-23 LAB — GLUCOSE, CAPILLARY: Glucose-Capillary: 108 mg/dL — ABNORMAL HIGH (ref 70–99)

## 2021-03-23 LAB — PROTIME-INR
INR: 1.8 — ABNORMAL HIGH (ref 0.8–1.2)
Prothrombin Time: 21.2 s — ABNORMAL HIGH (ref 11.4–15.2)

## 2021-03-23 MED ORDER — WARFARIN SODIUM 5 MG PO TABS: 5.0000 mg | ORAL_TABLET | Freq: Every day | ORAL | 0 refills | Status: DC

## 2021-03-23 MED ORDER — TOPIRAMATE 25 MG PO TABS: 25.0000 mg | ORAL_TABLET | Freq: Two times a day (BID) | ORAL | 0 refills | Status: DC

## 2021-03-23 MED ORDER — WARFARIN SODIUM 5 MG PO TABS: 5.0000 mg | ORAL_TABLET | Freq: Once | ORAL | Status: DC

## 2021-03-23 NOTE — Progress Notes (Signed)
ANTICOAGULATION CONSULT NOTE - Initial Consult  Pharmacy Consult for Coumadin Indication: atrial fibrillation  Allergies  Allergen Reactions   No Known Allergies     Patient Measurements: Height: 5\' 10"  (177.8 cm) Weight: 97.6 kg (215 lb 2.7 oz) IBW/kg (Calculated) : 73  Vital Signs: Temp: 98.7 F (37.1 C) (06/24 0428) Temp Source: Oral (06/24 0428) BP: 120/67 (06/24 0428) Pulse Rate: 74 (06/24 0428)  Labs: Recent Labs    03/21/21 0505 03/22/21 0418 03/23/21 0449  LABPROT 14.8 16.5* 21.2*  INR 1.2 1.3* 1.8*     Estimated Creatinine Clearance: 58.8 mL/min (A) (by C-G formula based on SCr of 1.35 mg/dL (H)).   Medical History: Past Medical History:  Diagnosis Date   A-fib ([place]) 2017   after last lung surgery patient had a history if Afib documented in hospital    Allergy    Arthritis    back & legs ( knees)   Cancer ([place])    lung   Complication of anesthesia    agitated upon waking from anesth.    Emphysema    History of lung cancer    Hyperlipidemia    Hypertension    Oxygen dependent    2L continuous   Tobacco abuse     Assessment:  Anticoag: Afib, not on anticoag PTA. Started Eliquis 6/20 (got 3 doses), no insurance, changed to warfarin 6/21. Hgb 13.6. Plts 261. CHADS2VASC: at least 4 - 6/21: INR 1.3>>1.8.   Large jump in INR from 1.3>>1.8. Will decrease dose today.   Goal of Therapy:  Therapeutic oral anticoagulation INR 2-3   Plan:  Coumadin 5 mg po x 1 tonight. Lovenox 40mg /d for bridging until INR>2. Daily INR   Iasha Mccalister A. Levada Dy, PharmD, BCPS, FNKF Clinical Pharmacist [place] Please utilize Amion for appropriate phone number to reach the unit pharmacist ([place])  Theotis Burrow 03/23/2021,7:43 AM

## 2021-03-23 NOTE — Progress Notes (Signed)
PROGRESS NOTE   Subjective/Complaints:  HA is better discussed topirimate   ROS:  Pt denies SOB, abd pain, CP, N/V/C/D, and vision changes  Objective:   No results found. No results for input(s): WBC, HGB, HCT, PLT in the last 72 hours.   No results for input(s): NA, K, CL, CO2, GLUCOSE, BUN, CREATININE, CALCIUM in the last 72 hours.    Intake/Output Summary (Last 24 hours) at 03/23/2021 0833 Last data filed at 03/23/2021 0457 Gross per 24 hour  Intake 170 ml  Output 500 ml  Net -330 ml         Physical Exam: Vital Signs Blood pressure 120/67, pulse 74, temperature 98.7 F (37.1 C), temperature source Oral, resp. rate 18, height 5\' 10"  (1.778 m), weight 97.6 kg, SpO2 95 %.  General: No acute distress Mood and affect are appropriate Heart: Regular rate and rhythm no rubs murmurs or extra sounds Lungs: Clear to auscultation, breathing unlabored, no rales or wheezes Abdomen: Positive bowel sounds, soft nontender to palpation, nondistended Extremities: No clubbing, cyanosis, or edema  Skin: No evidence of breakdown, no evidence of rash Neuro: reasonable insight and awareness. Right HH, delays in naming objects. RUE 4/5, RLE 4- to 4/5. LUE and LLE 4+ to 5/5.--motor scores stable.  No sensory findings.   Mild aphasia unchanged  Musculoskeletal: no pain in limbs/trunk Tremor right upper extremity intermittent and positional.  No evidence of spasticity  Assessment/Plan: 1. Functional deficits due to L CVA  Care Tool:  Bathing    Body parts bathed by patient: Right arm, Left arm, Abdomen, Chest, Front perineal area, Buttocks, Right upper leg, Left upper leg, Left lower leg, Right lower leg, Face         Bathing assist Assist Level: Supervision/Verbal cueing     Upper Body Dressing/Undressing Upper body dressing   What is the patient wearing?: Pull over shirt    Upper body assist Assist Level: Independent  with assistive device    Lower Body Dressing/Undressing Lower body dressing      What is the patient wearing?: Pants     Lower body assist Assist for lower body dressing: Supervision/Verbal cueing     Toileting Toileting    Toileting assist Assist for toileting: Supervision/Verbal cueing     Transfers Chair/bed transfer  Transfers assist     Chair/bed transfer assist level: Supervision/Verbal cueing     Locomotion Ambulation   Ambulation assist      Assist level: Supervision/Verbal cueing Assistive device: Walker-rolling Max distance: 161ft   Walk 10 feet activity   Assist     Assist level: Supervision/Verbal cueing Assistive device: Walker-rolling   Walk 50 feet activity   Assist    Assist level: Supervision/Verbal cueing Assistive device: Walker-rolling    Walk 150 feet activity   Assist Walk 150 feet activity did not occur: Safety/medical concerns  Assist level: Supervision/Verbal cueing Assistive device: Walker-rolling    Walk 10 feet on uneven surface  activity   Assist Walk 10 feet on uneven surfaces activity did not occur: Safety/medical concerns   Assist level: Contact Guard/Touching assist Assistive device: Aeronautical engineer Will patient use wheelchair  at discharge?: No   Wheelchair activity did not occur: N/A  Wheelchair assist level: Minimal Assistance - Patient > 75%      Wheelchair 50 feet with 2 turns activity    Assist    Wheelchair 50 feet with 2 turns activity did not occur: N/A   Assist Level: Minimal Assistance - Patient > 75%   Wheelchair 150 feet activity     Assist  Wheelchair 150 feet activity did not occur: N/A   Assist Level: Minimal Assistance - Patient > 75%   Blood pressure 120/67, pulse 74, temperature 98.7 F (37.1 C), temperature source Oral, resp. rate 18, height 5\' 10"  (1.778 m), weight 97.6 kg, SpO2 95 %.  Medical Problem List and Plan: 1.  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          D/C home today  .   -Continue CIR- PT, OT and SLP 2.  Antithrombotics: -DVT/anticoagulation: Lovenox             -antiplatelet therapy: Aspirin 325 mg daily, plan was transition to ELIQUIS 5 mg BID 03/19/2021 however cost is an issue per neuro okay to resume warfarin  Appreciate TOC consult no assistance for Eliquis - resume warfarin 3. Pain Management: Fioricet as needed headache.   6/20- Pain controlled- con't regimen 4. Mood/sleep: Melatonin 6 mg nightly                 -antipsychotic agents: N/A 5. Neuropsych: This patient is not capable of making decisions on his own behalf. 6. Skin/Wound Care: Routine skin checks 7. Fluids/Electrolytes/Nutrition: encouarge po  -I personally reviewed the patient's labs today. -BUN/Cr remain elevated--continue to encourage fluids   -recheck labs Monday 6/20- labs not drawn- reordered for tomorrow 8.  PAF.  Toprol-XL 50 mg daily, Cardizem 120 mg twice daily.  Cardiac rate remains controlled  6/20- rate controlled- con't regimen Vitals:   03/22/21 1935 03/23/21 0428  BP: (!) 112/59 120/67  Pulse: 73 74  Resp: 18 18  Temp: 98.1 F (36.7 C) 98.7 F (37.1 C)  SpO2: 96% 95%   Controlled HR As per neurology okay to resume warfarin 9.  Hyperlipidemia.  Fenofibrate 160 mg daily/Pravachol 10.  History of lung cancer/COPD.  Status post VATS.  Follow-up outpatient.  d/c O2 not using at home although he had a tank 11.  Tobacco abuse.  Counseling 12.  New onset diabetes mellitus.  Hemoglobin A1c 7.5.  SSI.  Consider metformin.  CBG (last 3)  Recent Labs    03/22/21 1637 03/22/21 2124 03/23/21 0613  GLUCAP 107* 94 108*     6/24 BG controlled- con't regimen 13.  Obesity.  BMI 31.63.  Dietary follow-up 14. Prerenal azotemia, mild baseline CKD?  BMET stable   15.  Vascular HA post CVA , cont topiramate  25mg  BID   LOS: 9 days A FACE TO FACE EVALUATION WAS PERFORMED  Charlett Blake 03/23/2021, 8:33 AM

## 2021-03-23 NOTE — Progress Notes (Addendum)
Patient slept well throughout the evening offers no complaints this morning. Pulse ox remains stable on room air pulse ox removed.

## 2021-03-24 DIAGNOSIS — Z9181 History of falling: Secondary | ICD-10-CM | POA: Diagnosis not present

## 2021-03-24 DIAGNOSIS — M1990 Unspecified osteoarthritis, unspecified site: Secondary | ICD-10-CM | POA: Diagnosis not present

## 2021-03-24 DIAGNOSIS — I129 Hypertensive chronic kidney disease with stage 1 through stage 4 chronic kidney disease, or unspecified chronic kidney disease: Secondary | ICD-10-CM | POA: Diagnosis not present

## 2021-03-24 DIAGNOSIS — E1165 Type 2 diabetes mellitus with hyperglycemia: Secondary | ICD-10-CM | POA: Diagnosis not present

## 2021-03-24 DIAGNOSIS — I69398 Other sequelae of cerebral infarction: Secondary | ICD-10-CM | POA: Diagnosis not present

## 2021-03-24 DIAGNOSIS — E785 Hyperlipidemia, unspecified: Secondary | ICD-10-CM | POA: Diagnosis not present

## 2021-03-24 DIAGNOSIS — I480 Paroxysmal atrial fibrillation: Secondary | ICD-10-CM | POA: Diagnosis not present

## 2021-03-24 DIAGNOSIS — E876 Hypokalemia: Secondary | ICD-10-CM | POA: Diagnosis not present

## 2021-03-24 DIAGNOSIS — Z9981 Dependence on supplemental oxygen: Secondary | ICD-10-CM | POA: Diagnosis not present

## 2021-03-24 DIAGNOSIS — Z6830 Body mass index (BMI) 30.0-30.9, adult: Secondary | ICD-10-CM | POA: Diagnosis not present

## 2021-03-24 DIAGNOSIS — H53461 Homonymous bilateral field defects, right side: Secondary | ICD-10-CM | POA: Diagnosis not present

## 2021-03-24 DIAGNOSIS — F17210 Nicotine dependence, cigarettes, uncomplicated: Secondary | ICD-10-CM | POA: Diagnosis not present

## 2021-03-24 DIAGNOSIS — I700 Atherosclerosis of aorta: Secondary | ICD-10-CM | POA: Diagnosis not present

## 2021-03-24 DIAGNOSIS — E669 Obesity, unspecified: Secondary | ICD-10-CM | POA: Diagnosis not present

## 2021-03-24 DIAGNOSIS — C3492 Malignant neoplasm of unspecified part of left bronchus or lung: Secondary | ICD-10-CM | POA: Diagnosis not present

## 2021-03-24 DIAGNOSIS — E1122 Type 2 diabetes mellitus with diabetic chronic kidney disease: Secondary | ICD-10-CM | POA: Diagnosis not present

## 2021-03-24 DIAGNOSIS — Z7984 Long term (current) use of oral hypoglycemic drugs: Secondary | ICD-10-CM | POA: Diagnosis not present

## 2021-03-24 DIAGNOSIS — N189 Chronic kidney disease, unspecified: Secondary | ICD-10-CM | POA: Diagnosis not present

## 2021-03-24 DIAGNOSIS — Z7901 Long term (current) use of anticoagulants: Secondary | ICD-10-CM | POA: Diagnosis not present

## 2021-03-24 DIAGNOSIS — G441 Vascular headache, not elsewhere classified: Secondary | ICD-10-CM | POA: Diagnosis not present

## 2021-03-24 DIAGNOSIS — J439 Emphysema, unspecified: Secondary | ICD-10-CM | POA: Diagnosis not present

## 2021-03-24 DIAGNOSIS — I69351 Hemiplegia and hemiparesis following cerebral infarction affecting right dominant side: Secondary | ICD-10-CM | POA: Diagnosis not present

## 2021-03-26 ENCOUNTER — Telehealth: Payer: Self-pay

## 2021-03-26 DIAGNOSIS — J439 Emphysema, unspecified: Secondary | ICD-10-CM | POA: Diagnosis not present

## 2021-03-26 DIAGNOSIS — I480 Paroxysmal atrial fibrillation: Secondary | ICD-10-CM | POA: Diagnosis not present

## 2021-03-26 DIAGNOSIS — I69398 Other sequelae of cerebral infarction: Secondary | ICD-10-CM | POA: Diagnosis not present

## 2021-03-26 DIAGNOSIS — H53461 Homonymous bilateral field defects, right side: Secondary | ICD-10-CM | POA: Diagnosis not present

## 2021-03-26 DIAGNOSIS — I69351 Hemiplegia and hemiparesis following cerebral infarction affecting right dominant side: Secondary | ICD-10-CM | POA: Diagnosis not present

## 2021-03-26 DIAGNOSIS — E1165 Type 2 diabetes mellitus with hyperglycemia: Secondary | ICD-10-CM | POA: Diagnosis not present

## 2021-03-26 NOTE — Telephone Encounter (Signed)
Transitional Care call--Lisa- wife of patient    Are you/is patient experiencing any problems since coming home? No Are there any questions regarding any aspect of care? No Are there any questions regarding medications administration/dosing? Chlorithalidone is not on med discharge list and wife states Dr. Percival Spanish prescribed it 6 months ago. I told her to call Dr. Rosezella [place] office Are meds being taken as prescribed? Patient should review meds with caller to confirm Have there been any falls? No Has Home Health been to the house and/or have they contacted you? Yes If not, have you tried to contact them? Can we help you contact them? Are bowels and bladder emptying properly? Yes Are there any unexpected incontinence issues? No If applicable, is patient following bowel/bladder programs? Any fevers, problems with breathing, unexpected pain? No Are there any skin problems or new areas of breakdown? No  Has the patient/family member arranged specialty MD follow up (ie cardiology/neurology/renal/surgical/etc)? Yes, told to call [facility] if not heard from them by 03/28/21 and call to schedule appt with Dr. Percival Spanish  Can we help arrange? Does the patient need any other services or support that we can help arrange? No Are caregivers following through as expected in assisting the patient? Yes Has the patient quit smoking, drinking alcohol, or using drugs as recommended? Yes  Appointment time 11:20 am, arrive time 11:40 am on 04/05/21 with Danella Sensing, then back with Dr. Letta Pate [phone]    Wife was asking about checking PT/INR. I told wife to call Dr. Rosezella [place] office because he needed a f/u appt anyway, so told her to make the appt, ask about the medication and PT/INR.

## 2021-03-27 ENCOUNTER — Telehealth: Payer: Self-pay | Admitting: *Deleted

## 2021-03-27 ENCOUNTER — Telehealth (INDEPENDENT_AMBULATORY_CARE_PROVIDER_SITE_OTHER): Payer: Self-pay

## 2021-03-27 DIAGNOSIS — I69398 Other sequelae of cerebral infarction: Secondary | ICD-10-CM | POA: Diagnosis not present

## 2021-03-27 DIAGNOSIS — I69351 Hemiplegia and hemiparesis following cerebral infarction affecting right dominant side: Secondary | ICD-10-CM | POA: Diagnosis not present

## 2021-03-27 DIAGNOSIS — H53461 Homonymous bilateral field defects, right side: Secondary | ICD-10-CM | POA: Diagnosis not present

## 2021-03-27 DIAGNOSIS — J439 Emphysema, unspecified: Secondary | ICD-10-CM | POA: Diagnosis not present

## 2021-03-27 DIAGNOSIS — I480 Paroxysmal atrial fibrillation: Secondary | ICD-10-CM | POA: Diagnosis not present

## 2021-03-27 DIAGNOSIS — E1165 Type 2 diabetes mellitus with hyperglycemia: Secondary | ICD-10-CM | POA: Diagnosis not present

## 2021-03-27 NOTE — Telephone Encounter (Signed)
Yes, please go ahead and give the verbal orders for the patient.  Thanks.

## 2021-03-27 NOTE — Telephone Encounter (Signed)
Alden Hipp, [facility] nusre called. Asking for verbal orders:  verbal orders to be seen by [facility] Nurse. Pt was seen over weekend 2 visits.  2x1 wk; then 1xwk for 6wks.  Need verbal order to approve orders.

## 2021-03-27 NOTE — Telephone Encounter (Signed)
Remo Lipps PT [facility] called for POC 2wk3 1wk4. Approval given.

## 2021-03-28 DIAGNOSIS — I69398 Other sequelae of cerebral infarction: Secondary | ICD-10-CM | POA: Diagnosis not present

## 2021-03-28 DIAGNOSIS — H53461 Homonymous bilateral field defects, right side: Secondary | ICD-10-CM | POA: Diagnosis not present

## 2021-03-28 DIAGNOSIS — I480 Paroxysmal atrial fibrillation: Secondary | ICD-10-CM | POA: Diagnosis not present

## 2021-03-28 DIAGNOSIS — E1165 Type 2 diabetes mellitus with hyperglycemia: Secondary | ICD-10-CM | POA: Diagnosis not present

## 2021-03-28 DIAGNOSIS — I69351 Hemiplegia and hemiparesis following cerebral infarction affecting right dominant side: Secondary | ICD-10-CM | POA: Diagnosis not present

## 2021-03-28 DIAGNOSIS — J439 Emphysema, unspecified: Secondary | ICD-10-CM | POA: Diagnosis not present

## 2021-03-28 NOTE — Telephone Encounter (Signed)
Called back to give verbal orders. Left message in detail in [place] voicemail.

## 2021-03-29 ENCOUNTER — Telehealth (INDEPENDENT_AMBULATORY_CARE_PROVIDER_SITE_OTHER): Payer: Self-pay

## 2021-03-29 ENCOUNTER — Telehealth: Payer: Self-pay | Admitting: *Deleted

## 2021-03-29 ENCOUNTER — Ambulatory Visit (INDEPENDENT_AMBULATORY_CARE_PROVIDER_SITE_OTHER): Payer: Medicare Other | Admitting: *Deleted

## 2021-03-29 ENCOUNTER — Telehealth: Payer: Self-pay | Admitting: Cardiology

## 2021-03-29 DIAGNOSIS — I480 Paroxysmal atrial fibrillation: Secondary | ICD-10-CM | POA: Diagnosis not present

## 2021-03-29 DIAGNOSIS — E1165 Type 2 diabetes mellitus with hyperglycemia: Secondary | ICD-10-CM | POA: Diagnosis not present

## 2021-03-29 DIAGNOSIS — I6359 Cerebral infarction due to unspecified occlusion or stenosis of other cerebral artery: Secondary | ICD-10-CM

## 2021-03-29 DIAGNOSIS — I69398 Other sequelae of cerebral infarction: Secondary | ICD-10-CM | POA: Diagnosis not present

## 2021-03-29 DIAGNOSIS — I69351 Hemiplegia and hemiparesis following cerebral infarction affecting right dominant side: Secondary | ICD-10-CM | POA: Diagnosis not present

## 2021-03-29 DIAGNOSIS — Z8673 Personal history of transient ischemic attack (TIA), and cerebral infarction without residual deficits: Secondary | ICD-10-CM

## 2021-03-29 DIAGNOSIS — J439 Emphysema, unspecified: Secondary | ICD-10-CM | POA: Diagnosis not present

## 2021-03-29 DIAGNOSIS — Z5181 Encounter for therapeutic drug level monitoring: Secondary | ICD-10-CM

## 2021-03-29 DIAGNOSIS — H53461 Homonymous bilateral field defects, right side: Secondary | ICD-10-CM | POA: Diagnosis not present

## 2021-03-29 LAB — POCT INR: INR: 2.8 (ref 2.0–3.0)

## 2021-03-29 NOTE — Telephone Encounter (Signed)
Ordering referral to neurology patient saw Dr. Merlene Laughter in the hospital so I would send referral to him. Thank you.

## 2021-03-29 NOTE — Telephone Encounter (Signed)
Will fwd to coumadin clinic Edrick Oh, RN).

## 2021-03-29 NOTE — Telephone Encounter (Signed)
Called Bethena Roys back at [phone] and left a detailed voice message with the message from Judson Roch.

## 2021-03-29 NOTE — Telephone Encounter (Signed)
Whitney an LPN with [facility] called and requested a verbal OK to check patients coumadin weekly.  Judson Roch gave the verbal OK per orders.  I advised Whitney and she verbalized an understanding.

## 2021-03-29 NOTE — Telephone Encounter (Signed)
Done.  See previous note.

## 2021-03-29 NOTE — Telephone Encounter (Signed)
Patients wife [name] called and left a detailed voice message that the patient needs a referral to a neurologist per the hospital and saw Dr. Merlene Laughter while he was in the hospital. Can you place this referral?

## 2021-03-29 NOTE — Telephone Encounter (Signed)
PT 33.9 INR 2.8  Per Whitney from [facility]- [phone]

## 2021-03-29 NOTE — Telephone Encounter (Signed)
result for today from [place] @ [facility] INR 2.8 PT 33.9

## 2021-03-29 NOTE — Telephone Encounter (Signed)
Shirlee Latch, speech therapist with [facility], called and left a detailed voice message requesting speech therapy 2 X a week for 6 weeks and also 1 X a week for 2 weeks, aphagia and cognitive changes. Please advise if OK to proceed with orders.

## 2021-03-29 NOTE — Patient Instructions (Signed)
Continue warfarin 5mg  daily Recheck on 04/04/21 By [facility] Order given to [facility]

## 2021-03-29 NOTE — Telephone Encounter (Signed)
Coumadin orders given to [facility].  See coumadin note.

## 2021-03-30 DIAGNOSIS — H53461 Homonymous bilateral field defects, right side: Secondary | ICD-10-CM | POA: Diagnosis not present

## 2021-03-30 DIAGNOSIS — E1165 Type 2 diabetes mellitus with hyperglycemia: Secondary | ICD-10-CM | POA: Diagnosis not present

## 2021-03-30 DIAGNOSIS — J439 Emphysema, unspecified: Secondary | ICD-10-CM | POA: Diagnosis not present

## 2021-03-30 DIAGNOSIS — I69398 Other sequelae of cerebral infarction: Secondary | ICD-10-CM | POA: Diagnosis not present

## 2021-03-30 DIAGNOSIS — I69351 Hemiplegia and hemiparesis following cerebral infarction affecting right dominant side: Secondary | ICD-10-CM | POA: Diagnosis not present

## 2021-03-30 DIAGNOSIS — I480 Paroxysmal atrial fibrillation: Secondary | ICD-10-CM | POA: Diagnosis not present

## 2021-04-03 ENCOUNTER — Telehealth (INDEPENDENT_AMBULATORY_CARE_PROVIDER_SITE_OTHER): Payer: Self-pay

## 2021-04-03 DIAGNOSIS — H53461 Homonymous bilateral field defects, right side: Secondary | ICD-10-CM | POA: Diagnosis not present

## 2021-04-03 DIAGNOSIS — I480 Paroxysmal atrial fibrillation: Secondary | ICD-10-CM | POA: Diagnosis not present

## 2021-04-03 DIAGNOSIS — I69351 Hemiplegia and hemiparesis following cerebral infarction affecting right dominant side: Secondary | ICD-10-CM | POA: Diagnosis not present

## 2021-04-03 DIAGNOSIS — I69398 Other sequelae of cerebral infarction: Secondary | ICD-10-CM | POA: Diagnosis not present

## 2021-04-03 DIAGNOSIS — J439 Emphysema, unspecified: Secondary | ICD-10-CM | POA: Diagnosis not present

## 2021-04-03 DIAGNOSIS — E1165 Type 2 diabetes mellitus with hyperglycemia: Secondary | ICD-10-CM | POA: Diagnosis not present

## 2021-04-03 NOTE — Telephone Encounter (Signed)
Called Randall Sullivan back and left a VM for the verbal approval OK for the orders requested.   Randall Sullivan did call back and stated: 2 weeks 3 1 week 4

## 2021-04-03 NOTE — Telephone Encounter (Signed)
Randall Sullivan with [facility] called and left a detailed voice message requesting verbal orders for the following:  Physical Therapy 2 visits for 3 weeks 1 visit for 4 weeks  Or possibly vice versa, was not able to understand this part of his message and called him back at [phone] and left a voice message to clarify.   Please advise and I will confirm PT orders once he calls back.

## 2021-04-03 NOTE — Telephone Encounter (Signed)
Patients wife [name] called on 04/02/2021 and left a detailed voice message that she needs to have a specific date for her return to work listed on question 4 of her [place] paperwork and stated that September 1st will be fine for now so she can help her husband during therapy and doctors visits as he needs her help during this time.   Called patient back and let her know that I have left paperwork for Judson Roch to update the request and wife verbalized an understanding and I also let her know that the referral has been ordered for patient to see Dr. Merlene Laughter since closer to home and it will take a few days to have completed. Wife verbalized an understanding.  Paperwork has been placed in [employer] with copy of previous paperwork. Sending as Juluis Rainier.

## 2021-04-04 DIAGNOSIS — I69351 Hemiplegia and hemiparesis following cerebral infarction affecting right dominant side: Secondary | ICD-10-CM | POA: Diagnosis not present

## 2021-04-04 DIAGNOSIS — H53461 Homonymous bilateral field defects, right side: Secondary | ICD-10-CM | POA: Diagnosis not present

## 2021-04-04 DIAGNOSIS — I480 Paroxysmal atrial fibrillation: Secondary | ICD-10-CM | POA: Diagnosis not present

## 2021-04-04 DIAGNOSIS — E1165 Type 2 diabetes mellitus with hyperglycemia: Secondary | ICD-10-CM | POA: Diagnosis not present

## 2021-04-04 DIAGNOSIS — J439 Emphysema, unspecified: Secondary | ICD-10-CM | POA: Diagnosis not present

## 2021-04-04 DIAGNOSIS — I69398 Other sequelae of cerebral infarction: Secondary | ICD-10-CM | POA: Diagnosis not present

## 2021-04-04 NOTE — Telephone Encounter (Signed)
Faxed paperwork to [facility] at [phone] and received a confirmation that the fax was received.   I called Randall Sullivan and let her know that I have faxed this paperwork and have a copy at the front desk for her to pick up. Randall Sullivan verbalized an understanding.

## 2021-04-04 NOTE — Telephone Encounter (Signed)
Sure! I will resent to Dr Merlene Laughter.

## 2021-04-05 ENCOUNTER — Encounter: Payer: Medicare Other | Attending: Registered Nurse | Admitting: Registered Nurse

## 2021-04-05 ENCOUNTER — Encounter: Payer: Self-pay | Admitting: Registered Nurse

## 2021-04-05 ENCOUNTER — Other Ambulatory Visit: Payer: Self-pay

## 2021-04-05 VITALS — BP 105/70 | HR 63 | Temp 98.3°F | Ht 70.0 in | Wt 215.0 lb

## 2021-04-05 DIAGNOSIS — I6381 Other cerebral infarction due to occlusion or stenosis of small artery: Secondary | ICD-10-CM

## 2021-04-05 DIAGNOSIS — I480 Paroxysmal atrial fibrillation: Secondary | ICD-10-CM

## 2021-04-05 DIAGNOSIS — J439 Emphysema, unspecified: Secondary | ICD-10-CM | POA: Diagnosis not present

## 2021-04-05 DIAGNOSIS — I639 Cerebral infarction, unspecified: Secondary | ICD-10-CM | POA: Diagnosis not present

## 2021-04-05 DIAGNOSIS — E1165 Type 2 diabetes mellitus with hyperglycemia: Secondary | ICD-10-CM | POA: Diagnosis not present

## 2021-04-05 DIAGNOSIS — I63539 Cerebral infarction due to unspecified occlusion or stenosis of unspecified posterior cerebral artery: Secondary | ICD-10-CM

## 2021-04-05 DIAGNOSIS — I10 Essential (primary) hypertension: Secondary | ICD-10-CM | POA: Diagnosis not present

## 2021-04-05 DIAGNOSIS — H53461 Homonymous bilateral field defects, right side: Secondary | ICD-10-CM | POA: Diagnosis not present

## 2021-04-05 DIAGNOSIS — R931 Abnormal findings on diagnostic imaging of heart and coronary circulation: Secondary | ICD-10-CM | POA: Diagnosis not present

## 2021-04-05 DIAGNOSIS — I69351 Hemiplegia and hemiparesis following cerebral infarction affecting right dominant side: Secondary | ICD-10-CM | POA: Diagnosis not present

## 2021-04-05 DIAGNOSIS — I69398 Other sequelae of cerebral infarction: Secondary | ICD-10-CM | POA: Diagnosis not present

## 2021-04-05 NOTE — Progress Notes (Signed)
Subjective:    Patient ID: Randall Sullivan, male    DOB: 22-Dec-1950, 70 y.o.   MRN: [id number]  HPI: Randall Sullivan is a 70 y.o. 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 He presented to [facility] on 03/12/2021, per Dr Olevia Bowens:  HPI: Randall Sullivan is a 69 y.o. male with medical history significant of paroxysmal atrial fibrillation, osteoarthritis, seasonal allergies, lung cancer, emphysema on home oxygen at 2 LPM, active smoker, hyperlipidemia, hypertension who was LKW  around 2000 on 03/11/2021 when he went to sleep and woke up around 0100 on 03/12/2021 with complaints of pressure-like nonradiating chest pain and shortness of breath.  No palpitations or orthopnea.  No recent lower extremity edema.  His wife stated he seemed a little confused initially, but this subsequently improved.  No aphasia or dysarthria.  No trouble comprehending language.  No headaches, nausea, blurred vision or lightheadedness.  However, the patient was clearly weaker on the right side, which was noticed by EMS but it improved in route to the hospital.  No fever, chills, sore throat, rhinorrhea, hemoptysis, but positive dyspnea and wheezing.  He has occasionally constipated, but otherwise denies abdominal pain, diarrhea, melena or hematochezia.  No dysuria, frequency or hematuria.  No polyuria, polydipsia, polyphagia or blurred vision.  CT Head:  IMPRESSION: No acute intracranial abnormality.  MR Brain WO Contrast:  IMPRESSION: Acute large left PCA territory infarction. Left PCA occlusion at the P1-P2 junction.   Small acute right thalamic infarct.   No hemorrhage or mass effect.  He was maintained on aspirin for CVA prophylaxis then transition to coumadin therapy for CVA prophylaxis, he has a history of PAF.   Randall Sullivan was admitted to inpatient Rehabilitation on 03/14/2021 and  discharged home on 03/23/2021. He is receiving home health therapy with [facility]. He denies any pain. He rates his pain 0. Also reports he has a good appetite.   Mr. And Mrs Sullivan reports when he takes his Topamax he feels jittery and his thoughts aren't clear. This was discussed with Dr Letta Pate. Topamax decreased to HS only, Randall Sullivan was instructed to call or send a My-Chart message in a week to evaluate medication change. They verbalize understanding.   Pain Inventory Average Pain 0 Pain Right Now 0 My pain is  n/a  LOCATION OF PAIN  no pain  BOWEL Number of stools per week: 3 Oral laxative use Yes  Type of laxative senokot Enema or suppository use No  History of colostomy No  Incontinent No   BLADDER Normal In and out cath, frequency n/a Able to self cath  n/a Bladder incontinence No  Frequent urination No  Leakage with coughing No  Difficulty starting stream No  Incomplete bladder emptying No    Mobility walk with assistance use a walker ability to climb steps?  no do you drive?  no  Function I need assistance with the following:  bathing, toileting, and meal prep  Neuro/Psych dizziness  Prior Studies Any changes since last visit?  no new pt  Physicians involved in your care N/a   Family History  Problem Relation Age of Onset   Cancer Mother    Cancer Sister    Aneurysm Sister    Kidney disease Paternal Aunt    Asthma Brother    Cancer - Lung Brother    Cancer  Brother    Social History   Socioeconomic History   Marital status: Married    Spouse name: Not on file   Number of children: Not on file   Years of education: Not on file   Highest education level: Not on file  Occupational History   Occupation: Vinyl siding  Tobacco Use   Smoking status: Former    Packs/day: 1.00    Years: 40.00    Pack years: 40.00    Types: Cigarettes    Quit date: 05/28/2016    Years since quitting: 4.8   Smokeless tobacco: Never  Vaping Use    Vaping Use: Never used  Substance and Sexual Activity   Alcohol use: Not Currently    Comment: quit- 2016   Drug use: No   Sexual activity: Not Currently  Other Topics Concern   Not on file  Social History Narrative   Married for 56 years,second.Lives with wife.Retired,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   Social Determinants of Health   Financial Resource Strain: Not on file  Food Insecurity: Not on file  Transportation Needs: Not on file  Physical Activity: Not on file  Stress: Not on file  Social Connections: Not on file   Past Surgical History:  Procedure Laterality Date   HERNIA REPAIR Bilateral 4034   umbilical   VIDEO ASSISTED THORACOSCOPY Right 12/21/2018   Procedure: VIDEO ASSISTED THORACOSCOPY AND RESECTION OF BLEBS RIGHT LOWER LOBE;  Surgeon: [name], MD;  Location: [facility];  Service: Thoracic;  Laterality: Right;   [place] (VATS)/ LOBECTOMY Left 08/01/2016   Procedure: VIDEO ASSISTED THORACOSCOPY (VATS)/ LEFT UPPER LOBECTOMY with lymph node sampling and onQ placement;  Surgeon: [name], MD;  Location: [facility];  Service: Thoracic;  Laterality: Left;   [place] (VATS)/WEDGE RESECTION Right 12/09/2018   Procedure: VIDEO ASSISTED THORACOSCOPY (VATS)/WEDGE RESECTION;  Surgeon: [name], MD;  Location: [facility];  Service: Thoracic;  Laterality: Right;   VIDEO BRONCHOSCOPY Bilateral 12/21/2018   Procedure: VIDEO BRONCHOSCOPY;  Surgeon: [name], MD;  Location: [facility];  Service: Thoracic;  Laterality: Bilateral;   VIDEO BRONCHOSCOPY WITH ENDOBRONCHIAL NAVIGATION N/A 07/03/2016   Procedure: VIDEO BRONCHOSCOPY WITH ENDOBRONCHIAL NAVIGATION;  Surgeon: [name], MD;  Location: [facility];  Service: Thoracic;  Laterality: N/A;   VIDEO BRONCHOSCOPY WITH INSERTION OF INTERBRONCHIAL VALVE (IBV) N/A 12/11/2018   Procedure: VIDEO BRONCHOSCOPY WITH INSERTION OF INTERBRONCHIAL VALVE (IBV);  Surgeon:  [name], MD;  Location: [facility] OR;  Service: Thoracic;  Laterality: N/A;   VIDEO BRONCHOSCOPY WITH INSERTION OF INTERBRONCHIAL VALVE (IBV) N/A 02/10/2019   Procedure: VIDEO BRONCHOSCOPY WITH REMOVAL OF INTERBRONCHIAL VALVE (IBV);  Surgeon: [name], MD;  Location: [place];  Service: Thoracic;  Laterality: N/A;   Past Medical History:  Diagnosis Date   A-fib ([place]) 2017   after last lung surgery patient had a history if Afib documented in hospital    Allergy    Arthritis    back & legs ( knees)   Cancer ([place])    lung   Complication of anesthesia    agitated upon waking from anesth.    Emphysema    History of lung cancer    Hyperlipidemia    Hypertension    Oxygen dependent    2L continuous   Tobacco abuse    BP 105/70 (BP Location: Right Arm, Patient Position: Sitting, Cuff Size: Large)   Pulse 63   Temp 98.3 F (36.8 C) (Oral)  Ht 5\' 10"  (1.778 m)   Wt 215 lb (97.5 kg)   SpO2 97%   BMI 30.85 kg/m   Opioid Risk Score:   Fall Risk Score:  `1  Depression screen PHQ 2/9  Depression screen [facility] 2/9 03/01/2021 10/26/2020 12/16/2019 11/17/2019 03/27/2017 02/08/2017 12/31/2016  Decreased Interest 0 0 0 0 0 0 0  Down, Depressed, Hopeless 0 0 0 0 0 0 0  PHQ - 2 Score 0 0 0 0 0 0 0  Altered sleeping 0 0 - - - - -  Tired, decreased energy 0 0 - - - - -  Change in appetite 0 0 - - - - -  Feeling bad or failure about yourself  0 0 - - - - -  Trouble concentrating 0 0 - - - - -  Moving slowly or fidgety/restless 0 0 - - - - -  Suicidal thoughts 0 0 - - - - -  PHQ-9 Score 0 0 - - - - -  Difficult doing work/chores Not difficult at all Not difficult at all - - - - -  Some recent data might be hidden     Review of Systems  Constitutional: Negative.   HENT: Negative.    Eyes: Negative.   Respiratory: Negative.    Cardiovascular: Negative.   Gastrointestinal: Negative.   Endocrine: Negative.   Genitourinary: Negative.   Musculoskeletal:  Positive for gait problem.   Skin: Negative.   Allergic/Immunologic: Negative.   Neurological:  Positive for dizziness.  Hematological: Negative.   Psychiatric/Behavioral: Negative.        Objective:   Physical Exam Vitals and nursing note reviewed.  Constitutional:      Appearance: Normal appearance.  Cardiovascular:     Rate and Rhythm: Normal rate and regular rhythm.     Pulses: Normal pulses.     Heart sounds: Normal heart sounds.  Pulmonary:     Effort: Pulmonary effort is normal.     Breath sounds: Normal breath sounds.  Musculoskeletal:     Cervical back: Normal range of motion and neck supple.     Comments: Normal Muscle Bulk and Muscle Testing Reveals:  Upper Extremities: Full ROM and Muscle Strength 5/5 Lower Extremities: Full ROM and Muscle Strength 5/5 Arrived in wheelchair     Skin:    General: Skin is warm and dry.  Neurological:     Mental Status: He is alert and oriented to person, place, and time.  Psychiatric:        Mood and Affect: Mood normal.        Behavior: Behavior normal.         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 She sates she wants to be scheduled with Dr Merlene Laughter. She verbalizes understanding. Continue [facility] Therapy with [facility].  2. Essential Hypertension: Continue current medication regimen. PCP following. Continue to monitor.  3. PAF: Continue coumadin: Cardiology Following.   F/U in 4- 6 weeks with Dr Letta Pate

## 2021-04-06 DIAGNOSIS — I480 Paroxysmal atrial fibrillation: Secondary | ICD-10-CM | POA: Diagnosis not present

## 2021-04-06 DIAGNOSIS — J439 Emphysema, unspecified: Secondary | ICD-10-CM | POA: Diagnosis not present

## 2021-04-06 DIAGNOSIS — E1165 Type 2 diabetes mellitus with hyperglycemia: Secondary | ICD-10-CM | POA: Diagnosis not present

## 2021-04-06 DIAGNOSIS — I69398 Other sequelae of cerebral infarction: Secondary | ICD-10-CM | POA: Diagnosis not present

## 2021-04-06 DIAGNOSIS — I69351 Hemiplegia and hemiparesis following cerebral infarction affecting right dominant side: Secondary | ICD-10-CM | POA: Diagnosis not present

## 2021-04-06 DIAGNOSIS — H53461 Homonymous bilateral field defects, right side: Secondary | ICD-10-CM | POA: Diagnosis not present

## 2021-04-09 DIAGNOSIS — I69398 Other sequelae of cerebral infarction: Secondary | ICD-10-CM | POA: Diagnosis not present

## 2021-04-09 DIAGNOSIS — J439 Emphysema, unspecified: Secondary | ICD-10-CM | POA: Diagnosis not present

## 2021-04-09 DIAGNOSIS — I69351 Hemiplegia and hemiparesis following cerebral infarction affecting right dominant side: Secondary | ICD-10-CM | POA: Diagnosis not present

## 2021-04-09 DIAGNOSIS — H53461 Homonymous bilateral field defects, right side: Secondary | ICD-10-CM | POA: Diagnosis not present

## 2021-04-09 DIAGNOSIS — I480 Paroxysmal atrial fibrillation: Secondary | ICD-10-CM | POA: Diagnosis not present

## 2021-04-09 DIAGNOSIS — E1165 Type 2 diabetes mellitus with hyperglycemia: Secondary | ICD-10-CM | POA: Diagnosis not present

## 2021-04-10 DIAGNOSIS — J439 Emphysema, unspecified: Secondary | ICD-10-CM | POA: Diagnosis not present

## 2021-04-10 DIAGNOSIS — I69398 Other sequelae of cerebral infarction: Secondary | ICD-10-CM | POA: Diagnosis not present

## 2021-04-10 DIAGNOSIS — I480 Paroxysmal atrial fibrillation: Secondary | ICD-10-CM | POA: Diagnosis not present

## 2021-04-10 DIAGNOSIS — E1165 Type 2 diabetes mellitus with hyperglycemia: Secondary | ICD-10-CM | POA: Diagnosis not present

## 2021-04-10 DIAGNOSIS — I69351 Hemiplegia and hemiparesis following cerebral infarction affecting right dominant side: Secondary | ICD-10-CM | POA: Diagnosis not present

## 2021-04-10 DIAGNOSIS — H53461 Homonymous bilateral field defects, right side: Secondary | ICD-10-CM | POA: Diagnosis not present

## 2021-04-11 DIAGNOSIS — J439 Emphysema, unspecified: Secondary | ICD-10-CM | POA: Diagnosis not present

## 2021-04-11 DIAGNOSIS — E1165 Type 2 diabetes mellitus with hyperglycemia: Secondary | ICD-10-CM | POA: Diagnosis not present

## 2021-04-11 DIAGNOSIS — I69398 Other sequelae of cerebral infarction: Secondary | ICD-10-CM | POA: Diagnosis not present

## 2021-04-11 DIAGNOSIS — I69351 Hemiplegia and hemiparesis following cerebral infarction affecting right dominant side: Secondary | ICD-10-CM | POA: Diagnosis not present

## 2021-04-11 DIAGNOSIS — H53461 Homonymous bilateral field defects, right side: Secondary | ICD-10-CM | POA: Diagnosis not present

## 2021-04-11 DIAGNOSIS — I480 Paroxysmal atrial fibrillation: Secondary | ICD-10-CM | POA: Diagnosis not present

## 2021-04-12 ENCOUNTER — Telehealth (INDEPENDENT_AMBULATORY_CARE_PROVIDER_SITE_OTHER): Payer: Self-pay

## 2021-04-12 ENCOUNTER — Ambulatory Visit (INDEPENDENT_AMBULATORY_CARE_PROVIDER_SITE_OTHER): Payer: Medicare Other | Admitting: *Deleted

## 2021-04-12 ENCOUNTER — Other Ambulatory Visit (INDEPENDENT_AMBULATORY_CARE_PROVIDER_SITE_OTHER): Payer: Self-pay | Admitting: Internal Medicine

## 2021-04-12 ENCOUNTER — Telehealth: Payer: Self-pay | Admitting: Cardiology

## 2021-04-12 DIAGNOSIS — Z5181 Encounter for therapeutic drug level monitoring: Secondary | ICD-10-CM

## 2021-04-12 DIAGNOSIS — J439 Emphysema, unspecified: Secondary | ICD-10-CM | POA: Diagnosis not present

## 2021-04-12 DIAGNOSIS — Z7901 Long term (current) use of anticoagulants: Secondary | ICD-10-CM | POA: Diagnosis not present

## 2021-04-12 DIAGNOSIS — E1165 Type 2 diabetes mellitus with hyperglycemia: Secondary | ICD-10-CM | POA: Diagnosis not present

## 2021-04-12 DIAGNOSIS — H53461 Homonymous bilateral field defects, right side: Secondary | ICD-10-CM | POA: Diagnosis not present

## 2021-04-12 DIAGNOSIS — I63539 Cerebral infarction due to unspecified occlusion or stenosis of unspecified posterior cerebral artery: Secondary | ICD-10-CM | POA: Diagnosis not present

## 2021-04-12 DIAGNOSIS — I480 Paroxysmal atrial fibrillation: Secondary | ICD-10-CM | POA: Diagnosis not present

## 2021-04-12 DIAGNOSIS — I69351 Hemiplegia and hemiparesis following cerebral infarction affecting right dominant side: Secondary | ICD-10-CM | POA: Diagnosis not present

## 2021-04-12 DIAGNOSIS — I69398 Other sequelae of cerebral infarction: Secondary | ICD-10-CM | POA: Diagnosis not present

## 2021-04-12 DIAGNOSIS — Z8673 Personal history of transient ischemic attack (TIA), and cerebral infarction without residual deficits: Secondary | ICD-10-CM

## 2021-04-12 LAB — POCT INR: INR: 2.4 (ref 2.0–3.0)

## 2021-04-12 NOTE — Telephone Encounter (Signed)
Spoke with [facility].  Coumadin orders given.  See anticoag note.

## 2021-04-12 NOTE — Telephone Encounter (Signed)
Colletta Maryland @ [facility] [phone] calling with INR = 2.4 on 7/13

## 2021-04-12 NOTE — Telephone Encounter (Signed)
Okay, I put the referral in.

## 2021-04-12 NOTE — Progress Notes (Signed)
ref

## 2021-04-12 NOTE — Telephone Encounter (Signed)
Thank you :)

## 2021-04-12 NOTE — Patient Instructions (Signed)
Continue warfarin 5mg  daily Recheck on 03/2121 By [facility] Order given to [facility] INR was checked last week on 04/05/21 by [facility].  INR was 2.5  Nurse states she called results but I did not get message.

## 2021-04-13 DIAGNOSIS — Z20822 Contact with and (suspected) exposure to covid-19: Secondary | ICD-10-CM | POA: Diagnosis not present

## 2021-04-16 DIAGNOSIS — I69398 Other sequelae of cerebral infarction: Secondary | ICD-10-CM | POA: Diagnosis not present

## 2021-04-16 DIAGNOSIS — I480 Paroxysmal atrial fibrillation: Secondary | ICD-10-CM | POA: Diagnosis not present

## 2021-04-16 DIAGNOSIS — H53461 Homonymous bilateral field defects, right side: Secondary | ICD-10-CM | POA: Diagnosis not present

## 2021-04-16 DIAGNOSIS — I69351 Hemiplegia and hemiparesis following cerebral infarction affecting right dominant side: Secondary | ICD-10-CM | POA: Diagnosis not present

## 2021-04-16 DIAGNOSIS — E1165 Type 2 diabetes mellitus with hyperglycemia: Secondary | ICD-10-CM | POA: Diagnosis not present

## 2021-04-16 DIAGNOSIS — J439 Emphysema, unspecified: Secondary | ICD-10-CM | POA: Diagnosis not present

## 2021-04-17 DIAGNOSIS — I480 Paroxysmal atrial fibrillation: Secondary | ICD-10-CM | POA: Diagnosis not present

## 2021-04-17 DIAGNOSIS — I69351 Hemiplegia and hemiparesis following cerebral infarction affecting right dominant side: Secondary | ICD-10-CM | POA: Diagnosis not present

## 2021-04-17 DIAGNOSIS — H53461 Homonymous bilateral field defects, right side: Secondary | ICD-10-CM | POA: Diagnosis not present

## 2021-04-17 DIAGNOSIS — E1165 Type 2 diabetes mellitus with hyperglycemia: Secondary | ICD-10-CM | POA: Diagnosis not present

## 2021-04-17 DIAGNOSIS — I69398 Other sequelae of cerebral infarction: Secondary | ICD-10-CM | POA: Diagnosis not present

## 2021-04-17 DIAGNOSIS — J439 Emphysema, unspecified: Secondary | ICD-10-CM | POA: Diagnosis not present

## 2021-04-18 ENCOUNTER — Other Ambulatory Visit (INDEPENDENT_AMBULATORY_CARE_PROVIDER_SITE_OTHER): Payer: Self-pay | Admitting: Nurse Practitioner

## 2021-04-18 ENCOUNTER — Encounter (INDEPENDENT_AMBULATORY_CARE_PROVIDER_SITE_OTHER): Payer: Self-pay | Admitting: Nurse Practitioner

## 2021-04-18 DIAGNOSIS — H53461 Homonymous bilateral field defects, right side: Secondary | ICD-10-CM | POA: Diagnosis not present

## 2021-04-18 DIAGNOSIS — I480 Paroxysmal atrial fibrillation: Secondary | ICD-10-CM

## 2021-04-18 DIAGNOSIS — I69351 Hemiplegia and hemiparesis following cerebral infarction affecting right dominant side: Secondary | ICD-10-CM | POA: Diagnosis not present

## 2021-04-18 DIAGNOSIS — J439 Emphysema, unspecified: Secondary | ICD-10-CM | POA: Diagnosis not present

## 2021-04-18 DIAGNOSIS — I69398 Other sequelae of cerebral infarction: Secondary | ICD-10-CM | POA: Diagnosis not present

## 2021-04-18 DIAGNOSIS — R739 Hyperglycemia, unspecified: Secondary | ICD-10-CM

## 2021-04-18 DIAGNOSIS — Z8673 Personal history of transient ischemic attack (TIA), and cerebral infarction without residual deficits: Secondary | ICD-10-CM

## 2021-04-18 DIAGNOSIS — E1165 Type 2 diabetes mellitus with hyperglycemia: Secondary | ICD-10-CM | POA: Diagnosis not present

## 2021-04-18 MED ORDER — WARFARIN SODIUM 5 MG PO TABS: 5.0000 mg | ORAL_TABLET | Freq: Every day | ORAL | 0 refills | Status: DC

## 2021-04-18 MED ORDER — GLIMEPIRIDE 1 MG PO TABS
1.0000 mg | ORAL_TABLET | Freq: Every day | ORAL | 0 refills | Status: DC
Start: 2021-04-18 — End: 2021-07-09

## 2021-04-18 MED ORDER — LORATADINE 10 MG PO TABS: 10.0000 mg | ORAL_TABLET | Freq: Every day | ORAL | 0 refills | Status: DC

## 2021-04-19 ENCOUNTER — Ambulatory Visit (INDEPENDENT_AMBULATORY_CARE_PROVIDER_SITE_OTHER): Payer: Medicare Other | Admitting: *Deleted

## 2021-04-19 DIAGNOSIS — E1165 Type 2 diabetes mellitus with hyperglycemia: Secondary | ICD-10-CM | POA: Diagnosis not present

## 2021-04-19 DIAGNOSIS — I69351 Hemiplegia and hemiparesis following cerebral infarction affecting right dominant side: Secondary | ICD-10-CM | POA: Diagnosis not present

## 2021-04-19 DIAGNOSIS — J439 Emphysema, unspecified: Secondary | ICD-10-CM | POA: Diagnosis not present

## 2021-04-19 DIAGNOSIS — Z7901 Long term (current) use of anticoagulants: Secondary | ICD-10-CM | POA: Diagnosis not present

## 2021-04-19 DIAGNOSIS — I480 Paroxysmal atrial fibrillation: Secondary | ICD-10-CM | POA: Diagnosis not present

## 2021-04-19 DIAGNOSIS — I69398 Other sequelae of cerebral infarction: Secondary | ICD-10-CM | POA: Diagnosis not present

## 2021-04-19 DIAGNOSIS — I639 Cerebral infarction, unspecified: Secondary | ICD-10-CM

## 2021-04-19 DIAGNOSIS — Z5181 Encounter for therapeutic drug level monitoring: Secondary | ICD-10-CM

## 2021-04-19 DIAGNOSIS — H53461 Homonymous bilateral field defects, right side: Secondary | ICD-10-CM | POA: Diagnosis not present

## 2021-04-19 LAB — POCT INR: INR: 2.8 (ref 2.0–3.0)

## 2021-04-19 NOTE — Patient Instructions (Signed)
Continue warfarin 5mg  daily Recheck on 03/2821 By [facility] Order given to Juliane Poot RN [facility]

## 2021-04-20 DIAGNOSIS — E1165 Type 2 diabetes mellitus with hyperglycemia: Secondary | ICD-10-CM | POA: Diagnosis not present

## 2021-04-20 DIAGNOSIS — J439 Emphysema, unspecified: Secondary | ICD-10-CM | POA: Diagnosis not present

## 2021-04-20 DIAGNOSIS — I69398 Other sequelae of cerebral infarction: Secondary | ICD-10-CM | POA: Diagnosis not present

## 2021-04-20 DIAGNOSIS — I69351 Hemiplegia and hemiparesis following cerebral infarction affecting right dominant side: Secondary | ICD-10-CM | POA: Diagnosis not present

## 2021-04-20 DIAGNOSIS — H53461 Homonymous bilateral field defects, right side: Secondary | ICD-10-CM | POA: Diagnosis not present

## 2021-04-20 DIAGNOSIS — I480 Paroxysmal atrial fibrillation: Secondary | ICD-10-CM | POA: Diagnosis not present

## 2021-04-23 DIAGNOSIS — Z7901 Long term (current) use of anticoagulants: Secondary | ICD-10-CM | POA: Diagnosis not present

## 2021-04-23 DIAGNOSIS — Z6830 Body mass index (BMI) 30.0-30.9, adult: Secondary | ICD-10-CM | POA: Diagnosis not present

## 2021-04-23 DIAGNOSIS — E876 Hypokalemia: Secondary | ICD-10-CM | POA: Diagnosis not present

## 2021-04-23 DIAGNOSIS — J439 Emphysema, unspecified: Secondary | ICD-10-CM | POA: Diagnosis not present

## 2021-04-23 DIAGNOSIS — I480 Paroxysmal atrial fibrillation: Secondary | ICD-10-CM | POA: Diagnosis not present

## 2021-04-23 DIAGNOSIS — Z9181 History of falling: Secondary | ICD-10-CM | POA: Diagnosis not present

## 2021-04-23 DIAGNOSIS — I700 Atherosclerosis of aorta: Secondary | ICD-10-CM | POA: Diagnosis not present

## 2021-04-23 DIAGNOSIS — F17210 Nicotine dependence, cigarettes, uncomplicated: Secondary | ICD-10-CM | POA: Diagnosis not present

## 2021-04-23 DIAGNOSIS — G441 Vascular headache, not elsewhere classified: Secondary | ICD-10-CM | POA: Diagnosis not present

## 2021-04-23 DIAGNOSIS — Z9981 Dependence on supplemental oxygen: Secondary | ICD-10-CM | POA: Diagnosis not present

## 2021-04-23 DIAGNOSIS — Z7984 Long term (current) use of oral hypoglycemic drugs: Secondary | ICD-10-CM | POA: Diagnosis not present

## 2021-04-23 DIAGNOSIS — C3492 Malignant neoplasm of unspecified part of left bronchus or lung: Secondary | ICD-10-CM | POA: Diagnosis not present

## 2021-04-23 DIAGNOSIS — E785 Hyperlipidemia, unspecified: Secondary | ICD-10-CM | POA: Diagnosis not present

## 2021-04-23 DIAGNOSIS — I69351 Hemiplegia and hemiparesis following cerebral infarction affecting right dominant side: Secondary | ICD-10-CM | POA: Diagnosis not present

## 2021-04-23 DIAGNOSIS — E669 Obesity, unspecified: Secondary | ICD-10-CM | POA: Diagnosis not present

## 2021-04-23 DIAGNOSIS — N189 Chronic kidney disease, unspecified: Secondary | ICD-10-CM | POA: Diagnosis not present

## 2021-04-23 DIAGNOSIS — E1122 Type 2 diabetes mellitus with diabetic chronic kidney disease: Secondary | ICD-10-CM | POA: Diagnosis not present

## 2021-04-23 DIAGNOSIS — I69398 Other sequelae of cerebral infarction: Secondary | ICD-10-CM | POA: Diagnosis not present

## 2021-04-23 DIAGNOSIS — E1165 Type 2 diabetes mellitus with hyperglycemia: Secondary | ICD-10-CM | POA: Diagnosis not present

## 2021-04-23 DIAGNOSIS — I129 Hypertensive chronic kidney disease with stage 1 through stage 4 chronic kidney disease, or unspecified chronic kidney disease: Secondary | ICD-10-CM | POA: Diagnosis not present

## 2021-04-23 DIAGNOSIS — M1990 Unspecified osteoarthritis, unspecified site: Secondary | ICD-10-CM | POA: Diagnosis not present

## 2021-04-23 DIAGNOSIS — H53461 Homonymous bilateral field defects, right side: Secondary | ICD-10-CM | POA: Diagnosis not present

## 2021-04-24 ENCOUNTER — Telehealth (INDEPENDENT_AMBULATORY_CARE_PROVIDER_SITE_OTHER): Payer: Self-pay

## 2021-04-24 DIAGNOSIS — I480 Paroxysmal atrial fibrillation: Secondary | ICD-10-CM | POA: Diagnosis not present

## 2021-04-24 DIAGNOSIS — E1165 Type 2 diabetes mellitus with hyperglycemia: Secondary | ICD-10-CM | POA: Diagnosis not present

## 2021-04-24 DIAGNOSIS — H53461 Homonymous bilateral field defects, right side: Secondary | ICD-10-CM | POA: Diagnosis not present

## 2021-04-24 DIAGNOSIS — I69351 Hemiplegia and hemiparesis following cerebral infarction affecting right dominant side: Secondary | ICD-10-CM | POA: Diagnosis not present

## 2021-04-24 DIAGNOSIS — J439 Emphysema, unspecified: Secondary | ICD-10-CM | POA: Diagnosis not present

## 2021-04-24 DIAGNOSIS — I69398 Other sequelae of cerebral infarction: Secondary | ICD-10-CM | POA: Diagnosis not present

## 2021-04-24 NOTE — Telephone Encounter (Signed)
Get Sarah to deal with this tomorrow.  Thanks.

## 2021-04-24 NOTE — Telephone Encounter (Signed)
FMLA 2nd set forms to be complete. Cannot be done at this time. Pt does not have a f/u for both PCP & Neurology until after Aug 22,2022 with Randall Sullivan. Once Randall Sullivan get notes and review charts and f/u with Randall Sullivan, then forms can be completed. Requesting a extensions until both appoints are completed?  Called:[phone]  & fx:[phone].

## 2021-04-25 DIAGNOSIS — J439 Emphysema, unspecified: Secondary | ICD-10-CM | POA: Diagnosis not present

## 2021-04-25 DIAGNOSIS — I480 Paroxysmal atrial fibrillation: Secondary | ICD-10-CM | POA: Diagnosis not present

## 2021-04-25 DIAGNOSIS — H53461 Homonymous bilateral field defects, right side: Secondary | ICD-10-CM | POA: Diagnosis not present

## 2021-04-25 DIAGNOSIS — I69351 Hemiplegia and hemiparesis following cerebral infarction affecting right dominant side: Secondary | ICD-10-CM | POA: Diagnosis not present

## 2021-04-25 DIAGNOSIS — I69398 Other sequelae of cerebral infarction: Secondary | ICD-10-CM | POA: Diagnosis not present

## 2021-04-25 DIAGNOSIS — E1165 Type 2 diabetes mellitus with hyperglycemia: Secondary | ICD-10-CM | POA: Diagnosis not present

## 2021-04-26 DIAGNOSIS — I480 Paroxysmal atrial fibrillation: Secondary | ICD-10-CM | POA: Diagnosis not present

## 2021-04-26 DIAGNOSIS — E1165 Type 2 diabetes mellitus with hyperglycemia: Secondary | ICD-10-CM | POA: Diagnosis not present

## 2021-04-26 DIAGNOSIS — H53461 Homonymous bilateral field defects, right side: Secondary | ICD-10-CM | POA: Diagnosis not present

## 2021-04-26 DIAGNOSIS — I69351 Hemiplegia and hemiparesis following cerebral infarction affecting right dominant side: Secondary | ICD-10-CM | POA: Diagnosis not present

## 2021-04-26 DIAGNOSIS — J439 Emphysema, unspecified: Secondary | ICD-10-CM | POA: Diagnosis not present

## 2021-04-26 DIAGNOSIS — I69398 Other sequelae of cerebral infarction: Secondary | ICD-10-CM | POA: Diagnosis not present

## 2021-04-27 DIAGNOSIS — I480 Paroxysmal atrial fibrillation: Secondary | ICD-10-CM | POA: Diagnosis not present

## 2021-04-27 DIAGNOSIS — I69398 Other sequelae of cerebral infarction: Secondary | ICD-10-CM | POA: Diagnosis not present

## 2021-04-27 DIAGNOSIS — H53461 Homonymous bilateral field defects, right side: Secondary | ICD-10-CM | POA: Diagnosis not present

## 2021-04-27 DIAGNOSIS — E1165 Type 2 diabetes mellitus with hyperglycemia: Secondary | ICD-10-CM | POA: Diagnosis not present

## 2021-04-27 DIAGNOSIS — J439 Emphysema, unspecified: Secondary | ICD-10-CM | POA: Diagnosis not present

## 2021-04-27 DIAGNOSIS — I69351 Hemiplegia and hemiparesis following cerebral infarction affecting right dominant side: Secondary | ICD-10-CM | POA: Diagnosis not present

## 2021-05-01 DIAGNOSIS — I69351 Hemiplegia and hemiparesis following cerebral infarction affecting right dominant side: Secondary | ICD-10-CM | POA: Diagnosis not present

## 2021-05-01 DIAGNOSIS — H53461 Homonymous bilateral field defects, right side: Secondary | ICD-10-CM | POA: Diagnosis not present

## 2021-05-01 DIAGNOSIS — E1165 Type 2 diabetes mellitus with hyperglycemia: Secondary | ICD-10-CM | POA: Diagnosis not present

## 2021-05-01 DIAGNOSIS — I69398 Other sequelae of cerebral infarction: Secondary | ICD-10-CM | POA: Diagnosis not present

## 2021-05-01 DIAGNOSIS — J439 Emphysema, unspecified: Secondary | ICD-10-CM | POA: Diagnosis not present

## 2021-05-01 DIAGNOSIS — I480 Paroxysmal atrial fibrillation: Secondary | ICD-10-CM | POA: Diagnosis not present

## 2021-05-02 DIAGNOSIS — H53461 Homonymous bilateral field defects, right side: Secondary | ICD-10-CM | POA: Diagnosis not present

## 2021-05-02 DIAGNOSIS — I69351 Hemiplegia and hemiparesis following cerebral infarction affecting right dominant side: Secondary | ICD-10-CM | POA: Diagnosis not present

## 2021-05-02 DIAGNOSIS — I480 Paroxysmal atrial fibrillation: Secondary | ICD-10-CM | POA: Diagnosis not present

## 2021-05-02 DIAGNOSIS — I69398 Other sequelae of cerebral infarction: Secondary | ICD-10-CM | POA: Diagnosis not present

## 2021-05-02 DIAGNOSIS — E1165 Type 2 diabetes mellitus with hyperglycemia: Secondary | ICD-10-CM | POA: Diagnosis not present

## 2021-05-02 DIAGNOSIS — J439 Emphysema, unspecified: Secondary | ICD-10-CM | POA: Diagnosis not present

## 2021-05-03 ENCOUNTER — Ambulatory Visit (INDEPENDENT_AMBULATORY_CARE_PROVIDER_SITE_OTHER): Payer: Medicare Other | Admitting: *Deleted

## 2021-05-03 ENCOUNTER — Telehealth: Payer: Self-pay | Admitting: *Deleted

## 2021-05-03 DIAGNOSIS — E1165 Type 2 diabetes mellitus with hyperglycemia: Secondary | ICD-10-CM | POA: Diagnosis not present

## 2021-05-03 DIAGNOSIS — J439 Emphysema, unspecified: Secondary | ICD-10-CM | POA: Diagnosis not present

## 2021-05-03 DIAGNOSIS — Z5181 Encounter for therapeutic drug level monitoring: Secondary | ICD-10-CM | POA: Diagnosis not present

## 2021-05-03 DIAGNOSIS — H53461 Homonymous bilateral field defects, right side: Secondary | ICD-10-CM | POA: Diagnosis not present

## 2021-05-03 DIAGNOSIS — I69351 Hemiplegia and hemiparesis following cerebral infarction affecting right dominant side: Secondary | ICD-10-CM | POA: Diagnosis not present

## 2021-05-03 DIAGNOSIS — I69398 Other sequelae of cerebral infarction: Secondary | ICD-10-CM | POA: Diagnosis not present

## 2021-05-03 DIAGNOSIS — I639 Cerebral infarction, unspecified: Secondary | ICD-10-CM

## 2021-05-03 DIAGNOSIS — I480 Paroxysmal atrial fibrillation: Secondary | ICD-10-CM | POA: Diagnosis not present

## 2021-05-03 LAB — POCT INR: INR: 2.7 (ref 2.0–3.0)

## 2021-05-03 NOTE — Telephone Encounter (Signed)
Spoke with [facility].  Coumadin orders given.  See coumadin note.

## 2021-05-03 NOTE — Telephone Encounter (Signed)
INR 2.7  Stephan[facility] with [facility] [phone]

## 2021-05-03 NOTE — Patient Instructions (Signed)
Continue warfarin 5mg  daily Recheck on 05/10/21 By [facility] Order given to [facility]

## 2021-05-07 ENCOUNTER — Telehealth (INDEPENDENT_AMBULATORY_CARE_PROVIDER_SITE_OTHER): Payer: Self-pay

## 2021-05-07 NOTE — Telephone Encounter (Signed)
Patients wife called and asked to have an extension on patients FMLA paperwork for wife due to ongoing therapy for patient and vision changes. Wife would like to extend to October 15th.

## 2021-05-08 ENCOUNTER — Ambulatory Visit (INDEPENDENT_AMBULATORY_CARE_PROVIDER_SITE_OTHER): Payer: Medicare Other | Admitting: *Deleted

## 2021-05-08 DIAGNOSIS — I480 Paroxysmal atrial fibrillation: Secondary | ICD-10-CM | POA: Diagnosis not present

## 2021-05-08 DIAGNOSIS — Z7901 Long term (current) use of anticoagulants: Secondary | ICD-10-CM

## 2021-05-08 DIAGNOSIS — Z5181 Encounter for therapeutic drug level monitoring: Secondary | ICD-10-CM

## 2021-05-08 DIAGNOSIS — I639 Cerebral infarction, unspecified: Secondary | ICD-10-CM | POA: Diagnosis not present

## 2021-05-08 DIAGNOSIS — J439 Emphysema, unspecified: Secondary | ICD-10-CM | POA: Diagnosis not present

## 2021-05-08 DIAGNOSIS — H53461 Homonymous bilateral field defects, right side: Secondary | ICD-10-CM | POA: Diagnosis not present

## 2021-05-08 DIAGNOSIS — I69351 Hemiplegia and hemiparesis following cerebral infarction affecting right dominant side: Secondary | ICD-10-CM | POA: Diagnosis not present

## 2021-05-08 DIAGNOSIS — I69398 Other sequelae of cerebral infarction: Secondary | ICD-10-CM | POA: Diagnosis not present

## 2021-05-08 DIAGNOSIS — E1165 Type 2 diabetes mellitus with hyperglycemia: Secondary | ICD-10-CM | POA: Diagnosis not present

## 2021-05-08 LAB — POCT INR: INR: 2.7 (ref 2.0–3.0)

## 2021-05-08 NOTE — Telephone Encounter (Signed)
Please schedule him for a virtual or in-person visit so we can discuss further. She should be present for this visit as well. I will also need paperwork from them to fill out.

## 2021-05-08 NOTE — Patient Instructions (Signed)
Continue warfarin 5mg  daily Recheck on 05/15/21 By [facility] Order given to Juliane Poot RN [facility]

## 2021-05-10 ENCOUNTER — Telehealth (INDEPENDENT_AMBULATORY_CARE_PROVIDER_SITE_OTHER): Payer: Self-pay

## 2021-05-10 DIAGNOSIS — I69398 Other sequelae of cerebral infarction: Secondary | ICD-10-CM | POA: Diagnosis not present

## 2021-05-10 DIAGNOSIS — E1165 Type 2 diabetes mellitus with hyperglycemia: Secondary | ICD-10-CM | POA: Diagnosis not present

## 2021-05-10 DIAGNOSIS — H53461 Homonymous bilateral field defects, right side: Secondary | ICD-10-CM | POA: Diagnosis not present

## 2021-05-10 DIAGNOSIS — I69351 Hemiplegia and hemiparesis following cerebral infarction affecting right dominant side: Secondary | ICD-10-CM | POA: Diagnosis not present

## 2021-05-10 DIAGNOSIS — I480 Paroxysmal atrial fibrillation: Secondary | ICD-10-CM | POA: Diagnosis not present

## 2021-05-10 DIAGNOSIS — J439 Emphysema, unspecified: Secondary | ICD-10-CM | POA: Diagnosis not present

## 2021-05-10 NOTE — Telephone Encounter (Signed)
Annie Main with [facility] left a detailed voice message asking for verbal OK to approve 1 more week of Home Health PT for patient. [phone] Please advise.

## 2021-05-10 NOTE — Telephone Encounter (Signed)
Called patients wife [name] and scheduled a telephone visit with Judson Roch for Wednesday 05/16/2021 at [place] verbalized an understanding.

## 2021-05-14 ENCOUNTER — Encounter (INDEPENDENT_AMBULATORY_CARE_PROVIDER_SITE_OTHER): Payer: Self-pay | Admitting: Nurse Practitioner

## 2021-05-14 DIAGNOSIS — H53461 Homonymous bilateral field defects, right side: Secondary | ICD-10-CM | POA: Diagnosis not present

## 2021-05-14 DIAGNOSIS — J439 Emphysema, unspecified: Secondary | ICD-10-CM | POA: Diagnosis not present

## 2021-05-14 DIAGNOSIS — E1165 Type 2 diabetes mellitus with hyperglycemia: Secondary | ICD-10-CM | POA: Diagnosis not present

## 2021-05-14 DIAGNOSIS — I480 Paroxysmal atrial fibrillation: Secondary | ICD-10-CM | POA: Diagnosis not present

## 2021-05-14 DIAGNOSIS — I69398 Other sequelae of cerebral infarction: Secondary | ICD-10-CM | POA: Diagnosis not present

## 2021-05-14 DIAGNOSIS — I69351 Hemiplegia and hemiparesis following cerebral infarction affecting right dominant side: Secondary | ICD-10-CM | POA: Diagnosis not present

## 2021-05-14 NOTE — Telephone Encounter (Signed)
Called Randall Sullivan and left a detailed voice message with the message from Randall Sullivan.

## 2021-05-15 ENCOUNTER — Ambulatory Visit (INDEPENDENT_AMBULATORY_CARE_PROVIDER_SITE_OTHER): Payer: Medicare Other | Admitting: *Deleted

## 2021-05-15 DIAGNOSIS — J439 Emphysema, unspecified: Secondary | ICD-10-CM | POA: Diagnosis not present

## 2021-05-15 DIAGNOSIS — H53461 Homonymous bilateral field defects, right side: Secondary | ICD-10-CM | POA: Diagnosis not present

## 2021-05-15 DIAGNOSIS — Z7901 Long term (current) use of anticoagulants: Secondary | ICD-10-CM

## 2021-05-15 DIAGNOSIS — Z5181 Encounter for therapeutic drug level monitoring: Secondary | ICD-10-CM | POA: Diagnosis not present

## 2021-05-15 DIAGNOSIS — I69351 Hemiplegia and hemiparesis following cerebral infarction affecting right dominant side: Secondary | ICD-10-CM | POA: Diagnosis not present

## 2021-05-15 DIAGNOSIS — I480 Paroxysmal atrial fibrillation: Secondary | ICD-10-CM | POA: Diagnosis not present

## 2021-05-15 DIAGNOSIS — E1165 Type 2 diabetes mellitus with hyperglycemia: Secondary | ICD-10-CM | POA: Diagnosis not present

## 2021-05-15 DIAGNOSIS — I639 Cerebral infarction, unspecified: Secondary | ICD-10-CM | POA: Diagnosis not present

## 2021-05-15 DIAGNOSIS — I69398 Other sequelae of cerebral infarction: Secondary | ICD-10-CM | POA: Diagnosis not present

## 2021-05-15 LAB — POCT INR: INR: 2.8 (ref 2.0–3.0)

## 2021-05-15 NOTE — Patient Instructions (Signed)
Continue warfarin 5mg  daily Recheck on 05/22/21 By [facility] Order given to Juliane Poot RN [facility]

## 2021-05-16 ENCOUNTER — Encounter (INDEPENDENT_AMBULATORY_CARE_PROVIDER_SITE_OTHER): Payer: Self-pay | Admitting: Nurse Practitioner

## 2021-05-16 ENCOUNTER — Telehealth (INDEPENDENT_AMBULATORY_CARE_PROVIDER_SITE_OTHER): Payer: Medicare Other | Admitting: Nurse Practitioner

## 2021-05-16 VITALS — BP 132/78 | HR 61 | Ht 70.0 in | Wt 212.0 lb

## 2021-05-16 DIAGNOSIS — R413 Other amnesia: Secondary | ICD-10-CM

## 2021-05-16 DIAGNOSIS — R2689 Other abnormalities of gait and mobility: Secondary | ICD-10-CM | POA: Diagnosis not present

## 2021-05-16 DIAGNOSIS — I6930 Unspecified sequelae of cerebral infarction: Secondary | ICD-10-CM | POA: Diagnosis not present

## 2021-05-16 DIAGNOSIS — G4700 Insomnia, unspecified: Secondary | ICD-10-CM | POA: Diagnosis not present

## 2021-05-16 MED ORDER — TRAZODONE HCL 50 MG PO TABS
25.0000 mg | ORAL_TABLET | Freq: Every day | ORAL | 0 refills | Status: DC
Start: 2021-05-16 — End: 2021-07-30

## 2021-05-16 NOTE — Progress Notes (Signed)
Due to national recommendations of social distancing related to the [place] pandemic, an audio-only tele-health visit was felt to be the most appropriate encounter type for this patient today. I connected with  Randall Sullivan on 05/16/21 utilizing audio-only technology and verified that I am speaking with the correct person using two identifiers. The patient was located at their home, and I was located at the office of [facility] during the encounter. I discussed the limitations of evaluation and management by telemedicine. The patient expressed understanding and agreed to proceed.    Subjective:  Patient ID: Randall Sullivan, male    DOB: 03/27/1951  Age: 70 y.o. MRN: [id number]  CC:  Chief Complaint  Patient presents with   Follow-up    Discuss FMLA paperwork for Randall Sullivan and patient is doing okay and still working on balance and vision, wants to know if they can extend PT for patient, needs refills on trazodone       HPI  This visit was completed with the patient's wife Randall Sullivan [phone]) over the phone.  The patient was not present for the visit.  She is requesting an extension on her FMLA because she still is having to help Randall Sullivan quite a bit at home.  The patient had a stroke earlier in June of this year.  He has made some improvement with working with physical therapy and is now able to transfer on his own as well as go to the restroom and bathe on his own.  He still having quite a bit of balance issues and dizziness.  He also is having peripheral vision abnormalities.  She currently has to make his food, give him his medications, and she has been helping him with his ambulation and transfers to hopefully prevent falls related to his balance issues and dizziness.  He does continue to work with physical therapy at home, and the wife is requesting extension on this if possible as well.  Patient is going to be seeing Dr. Merlene Laughter for neurology and is scheduled for an appointment next  week.  [facility] and is working with physical therapy as well as speech therapy.  The wife tells me the patient is able to swallow his medication okay but the speech therapist is helping a lot with his short-term memory deficits that are new since the stroke.  She is requesting a refill on his trazodone. She has no other acute complaints on behalf of the patient today.  Past Medical History:  Diagnosis Date   A-fib ([place]) 2017   after last lung surgery patient had a history if Afib documented in hospital    Allergy    Arthritis    back & legs ( knees)   Cancer ([place])    lung   Complication of anesthesia    agitated upon waking from anesth.    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  Social History   Tobacco Use   Smoking status: Former    Packs/day: 1.00    Years: 40.00    Pack years: 40.00    Types: Cigarettes    Quit date: 05/28/2016    Years since quitting: 4.9   Smokeless tobacco: Never  Substance Use Topics   Alcohol use: Not Currently    Comment: quit- 2016     Current Meds  Medication Sig   acetaminophen (TYLENOL) 325 MG tablet Take 2 tablets (650 mg total) by mouth every 6 (six) hours as needed for mild pain (or Fever >/= 101).   albuterol (PROVENTIL) (2.5 MG/3ML) 0.083% nebulizer solution Take 3 mLs (2.5 mg total) by nebulization every 6 (six) hours as needed for wheezing or shortness of breath.   butalbital-acetaminophen-caffeine (FIORICET) 50-325-40 MG tablet Take 1 tablet by mouth every 6 (six)  hours as needed for headache or migraine.   Cholecalciferol (VITAMIN D3) 125 MCG (5000 UT) CAPS Take 1 capsule (5,000 Units total) by mouth daily.   diltiazem (CARDIZEM CD) 120 MG 24 hr capsule Take 1 capsule (120 mg total) by mouth daily.   fenofibrate (TRICOR) 145 MG tablet Take 1 tablet (145 mg total) by mouth daily.   glimepiride (AMARYL) 1 MG tablet Take 1 tablet (1 mg total) by mouth daily with breakfast.   loratadine (CLARITIN) 10 MG tablet Take 1 tablet (10 mg total) by mouth daily.   melatonin 3 MG TABS tablet Take 2 tablets (6 mg total) by mouth at bedtime.   metoprolol succinate (TOPROL-XL) 50 MG 24 hr tablet Take 1 tablet (50 mg total) by mouth daily. Take with or immediately following a meal.   OXYGEN Inhale 2-3 L into the lungs continuous. Hx of lung ca, copd, and emphysema   polyethylene glycol (MIRALAX / GLYCOLAX) 17 g packet Take 17 g by mouth daily as needed for mild constipation.   pravastatin (PRAVACHOL) 40 MG tablet Take 1 tablet (40 mg total) by mouth daily.   warfarin (COUMADIN) 5 MG tablet Take 1 tablet (5 mg total) by mouth daily.   [DISCONTINUED] traZODone (DESYREL) 50 MG tablet Take 0.5 tablets (25 mg total) by mouth at bedtime.       Objective:   Today's Vitals: BP 132/78   Pulse 61   Ht 5\' 10"  (1.778 m)   Wt 212 lb (96.2 kg)   SpO2 96%   BMI 30.42 kg/m  Vitals with BMI 05/16/2021 04/05/2021 03/23/2021  Height 5\' 10"  5\' 10"  -  Weight 212 lbs 215 lbs -  BMI 33.82 50.53 -  Systolic 976 734 193  Diastolic 78 70 67  Pulse 61 63 74     Physical Exam No physical exam conducted today.      Assessment and Plan   1. Insomnia, unspecified type   2. Balance disorder   3. History of CVA with residual deficit   4. Short-term memory loss      Plan: 1.  Trazodone refilled the patient's pharmacy. 2.-4.  Wife was encouraged to make sure patient goes to the neurology appointment as scheduled.  We will also fill out FMLA paperwork for her if she can get it  faxed to our office and she was given our fax number.  I told her that there is no guarantee that insurance will approve additional home health physical therapy and speech therapy but I will place order for this today in the event that they will approve this.  She would like to stay with  [facility] if possible.  Their fax number is [phone].   Tests ordered No orders of the defined types were placed in this encounter.     Meds ordered this encounter  Medications   traZODone (DESYREL) 50 MG tablet    Sig: Take 0.5 tablets (25 mg total) by mouth at bedtime.    Dispense:  45 tablet    Refill:  0    Order Specific Question:   Supervising Provider    Answer:   Lindell Spar [id number]   Patient not scheduled for follow-up as this office is closing permanently as of 05/30/21 due to the passing of Dr. Anastasio Champion.  The patient was notified of this and that they will need to find a new primary care provider.  They express understanding.  Total time spent on the phone today was 15 minutes.   Ailene Ards, NP

## 2021-05-17 ENCOUNTER — Encounter: Payer: Medicare Other | Admitting: Physical Medicine & Rehabilitation

## 2021-05-17 DIAGNOSIS — E1165 Type 2 diabetes mellitus with hyperglycemia: Secondary | ICD-10-CM | POA: Diagnosis not present

## 2021-05-17 DIAGNOSIS — I69398 Other sequelae of cerebral infarction: Secondary | ICD-10-CM | POA: Diagnosis not present

## 2021-05-17 DIAGNOSIS — J439 Emphysema, unspecified: Secondary | ICD-10-CM | POA: Diagnosis not present

## 2021-05-17 DIAGNOSIS — I69351 Hemiplegia and hemiparesis following cerebral infarction affecting right dominant side: Secondary | ICD-10-CM | POA: Diagnosis not present

## 2021-05-17 DIAGNOSIS — H53461 Homonymous bilateral field defects, right side: Secondary | ICD-10-CM | POA: Diagnosis not present

## 2021-05-17 DIAGNOSIS — I480 Paroxysmal atrial fibrillation: Secondary | ICD-10-CM | POA: Diagnosis not present

## 2021-05-21 ENCOUNTER — Telehealth: Payer: Self-pay | Admitting: *Deleted

## 2021-05-21 ENCOUNTER — Ambulatory Visit (INDEPENDENT_AMBULATORY_CARE_PROVIDER_SITE_OTHER): Payer: Medicare Other | Admitting: *Deleted

## 2021-05-21 ENCOUNTER — Encounter (INDEPENDENT_AMBULATORY_CARE_PROVIDER_SITE_OTHER): Payer: Self-pay | Admitting: Nurse Practitioner

## 2021-05-21 DIAGNOSIS — I10 Essential (primary) hypertension: Secondary | ICD-10-CM | POA: Diagnosis not present

## 2021-05-21 DIAGNOSIS — E785 Hyperlipidemia, unspecified: Secondary | ICD-10-CM | POA: Diagnosis not present

## 2021-05-21 DIAGNOSIS — E1165 Type 2 diabetes mellitus with hyperglycemia: Secondary | ICD-10-CM | POA: Diagnosis not present

## 2021-05-21 DIAGNOSIS — H53461 Homonymous bilateral field defects, right side: Secondary | ICD-10-CM | POA: Diagnosis not present

## 2021-05-21 DIAGNOSIS — I69398 Other sequelae of cerebral infarction: Secondary | ICD-10-CM | POA: Diagnosis not present

## 2021-05-21 DIAGNOSIS — F17200 Nicotine dependence, unspecified, uncomplicated: Secondary | ICD-10-CM | POA: Diagnosis not present

## 2021-05-21 DIAGNOSIS — Z85118 Personal history of other malignant neoplasm of bronchus and lung: Secondary | ICD-10-CM | POA: Diagnosis not present

## 2021-05-21 DIAGNOSIS — I480 Paroxysmal atrial fibrillation: Secondary | ICD-10-CM | POA: Diagnosis not present

## 2021-05-21 DIAGNOSIS — I69351 Hemiplegia and hemiparesis following cerebral infarction affecting right dominant side: Secondary | ICD-10-CM | POA: Diagnosis not present

## 2021-05-21 DIAGNOSIS — I639 Cerebral infarction, unspecified: Secondary | ICD-10-CM | POA: Diagnosis not present

## 2021-05-21 DIAGNOSIS — J439 Emphysema, unspecified: Secondary | ICD-10-CM | POA: Diagnosis not present

## 2021-05-21 LAB — POCT INR: INR: 2.8 (ref 2.0–3.0)

## 2021-05-21 NOTE — Telephone Encounter (Signed)
Randall Sullivan w/ [facility] called - [phone]  PT-33.2 INR -2.8

## 2021-05-21 NOTE — Telephone Encounter (Signed)
LMOM for [facility] with coumadin instructions.  See coumadin note.

## 2021-05-21 NOTE — Patient Instructions (Signed)
Continue warfarin 5mg  daily Recheck in 1 wk by [facility] Order given to Juliane Poot RN [facility]

## 2021-05-22 ENCOUNTER — Telehealth (INDEPENDENT_AMBULATORY_CARE_PROVIDER_SITE_OTHER): Payer: Self-pay

## 2021-05-22 DIAGNOSIS — J439 Emphysema, unspecified: Secondary | ICD-10-CM | POA: Diagnosis not present

## 2021-05-22 DIAGNOSIS — E1165 Type 2 diabetes mellitus with hyperglycemia: Secondary | ICD-10-CM | POA: Diagnosis not present

## 2021-05-22 DIAGNOSIS — I69398 Other sequelae of cerebral infarction: Secondary | ICD-10-CM | POA: Diagnosis not present

## 2021-05-22 DIAGNOSIS — I480 Paroxysmal atrial fibrillation: Secondary | ICD-10-CM | POA: Diagnosis not present

## 2021-05-22 DIAGNOSIS — H53461 Homonymous bilateral field defects, right side: Secondary | ICD-10-CM | POA: Diagnosis not present

## 2021-05-22 DIAGNOSIS — I69351 Hemiplegia and hemiparesis following cerebral infarction affecting right dominant side: Secondary | ICD-10-CM | POA: Diagnosis not present

## 2021-05-22 DIAGNOSIS — I6930 Unspecified sequelae of cerebral infarction: Secondary | ICD-10-CM

## 2021-05-22 NOTE — Telephone Encounter (Signed)
Received a voice message from Shirlee Latch a speech therapist with [facility] [phone] that patient is no longer home bound and needs to have an order faxed for outpatient speech therapy to be done at [facility] and fax to [phone].   I called Bethena Roys back and left a detailed VM that Judson Roch will be in the office tomorrow.

## 2021-05-23 NOTE — Telephone Encounter (Signed)
Please print and fax referral order to the number provided.

## 2021-05-23 NOTE — Telephone Encounter (Signed)
I have faxed the referral request to the fax provided but not sure if this can be accepted since the referral has not been processed? Just wanted to make sure that Randall Sullivan is aware so this can be processed for the patient?

## 2021-05-24 ENCOUNTER — Encounter (INDEPENDENT_AMBULATORY_CARE_PROVIDER_SITE_OTHER): Payer: Self-pay | Admitting: Nurse Practitioner

## 2021-05-25 ENCOUNTER — Encounter: Payer: Self-pay | Admitting: Internal Medicine

## 2021-05-25 ENCOUNTER — Ambulatory Visit: Payer: Medicare Other | Admitting: Cardiology

## 2021-06-01 ENCOUNTER — Telehealth: Payer: Self-pay | Admitting: *Deleted

## 2021-06-01 NOTE — Telephone Encounter (Signed)
Returning call to Edrick Oh that next INR check is scheduled 06/08/2021

## 2021-06-01 NOTE — Telephone Encounter (Signed)
Returned a call to [facility] Nurse, there was no answer so left a message stating to call back regarding mutual pt.   The pt has an appt scheduled on 06/08/21 in the office with [name] and it was scheduled on 05/21/21 on the same day as the last INR check. Will need to confirm if Home Health is going to check INR on 9/9 or if pt will be coming into the office.

## 2021-06-05 ENCOUNTER — Encounter (HOSPITAL_COMMUNITY): Payer: Self-pay

## 2021-06-05 ENCOUNTER — Other Ambulatory Visit: Payer: Self-pay

## 2021-06-05 ENCOUNTER — Inpatient Hospital Stay: Payer: Medicare Other | Attending: Internal Medicine

## 2021-06-05 ENCOUNTER — Ambulatory Visit (HOSPITAL_COMMUNITY)
Admission: RE | Admit: 2021-06-05 | Discharge: 2021-06-05 | Disposition: A | Discharge status: Home / Self Care | Payer: Medicare Other | Source: Ambulatory Visit | Attending: Internal Medicine | Admitting: Internal Medicine

## 2021-06-05 DIAGNOSIS — I700 Atherosclerosis of aorta: Secondary | ICD-10-CM | POA: Diagnosis not present

## 2021-06-05 DIAGNOSIS — C3490 Malignant neoplasm of unspecified part of unspecified bronchus or lung: Secondary | ICD-10-CM | POA: Insufficient documentation

## 2021-06-05 DIAGNOSIS — C3412 Malignant neoplasm of upper lobe, left bronchus or lung: Secondary | ICD-10-CM | POA: Diagnosis not present

## 2021-06-05 DIAGNOSIS — R911 Solitary pulmonary nodule: Secondary | ICD-10-CM | POA: Diagnosis not present

## 2021-06-05 DIAGNOSIS — J439 Emphysema, unspecified: Secondary | ICD-10-CM | POA: Diagnosis not present

## 2021-06-05 LAB — CBC WITH DIFFERENTIAL (CANCER CENTER ONLY)
Abs Immature Granulocytes: 0.04 10*3/uL (ref 0.00–0.07)
Basophils Absolute: 0 10*3/uL (ref 0.0–0.1)
Basophils Relative: 1 %
Eosinophils Absolute: 0.2 10*3/uL (ref 0.0–0.5)
Eosinophils Relative: 2 %
HCT: 49.9 % (ref 39.0–52.0)
Hemoglobin: 15.9 g/dL (ref 13.0–17.0)
Immature Granulocytes: 1 %
Lymphocytes Relative: 18 %
Lymphs Abs: 1.4 10*3/uL (ref 0.7–4.0)
MCH: 27 pg (ref 26.0–34.0)
MCHC: 31.9 g/dL (ref 30.0–36.0)
MCV: 84.7 fL (ref 80.0–100.0)
Monocytes Absolute: 0.9 10*3/uL (ref 0.1–1.0)
Monocytes Relative: 11 %
Neutro Abs: 5.3 10*3/uL (ref 1.7–7.7)
Neutrophils Relative %: 67 %
Platelet Count: 232 10*3/uL (ref 150–400)
RBC: 5.89 MIL/uL — ABNORMAL HIGH (ref 4.22–5.81)
RDW: 14.6 % (ref 11.5–15.5)
WBC Count: 7.8 10*3/uL (ref 4.0–10.5)
nRBC: 0 % (ref 0.0–0.2)

## 2021-06-05 LAB — CMP (CANCER CENTER ONLY)
ALT: 33 U/L (ref 0–44)
AST: 24 U/L (ref 15–41)
Albumin: 4.5 g/dL (ref 3.5–5.0)
Alkaline Phosphatase: 39 U/L (ref 38–126)
Anion gap: 10 (ref 5–15)
BUN: 20 mg/dL (ref 8–23)
CO2: 28 mmol/L (ref 22–32)
Calcium: 9.9 mg/dL (ref 8.9–10.3)
Chloride: 102 mmol/L (ref 98–111)
Creatinine: 1.4 mg/dL — ABNORMAL HIGH (ref 0.61–1.24)
GFR, Estimated: 54 mL/min — ABNORMAL LOW (ref >60)
Glucose, Bld: 84 mg/dL (ref 70–99)
Potassium: 4.9 mmol/L (ref 3.5–5.1)
Sodium: 140 mmol/L (ref 135–145)
Total Bilirubin: 0.6 mg/dL (ref 0.3–1.2)
Total Protein: 8 g/dL (ref 6.5–8.1)

## 2021-06-06 ENCOUNTER — Inpatient Hospital Stay (HOSPITAL_BASED_OUTPATIENT_CLINIC_OR_DEPARTMENT_OTHER): Payer: Medicare Other | Admitting: Internal Medicine

## 2021-06-06 ENCOUNTER — Inpatient Hospital Stay: Payer: Medicare Other | Admitting: Internal Medicine

## 2021-06-06 ENCOUNTER — Ambulatory Visit: Payer: Medicare Other | Admitting: Internal Medicine

## 2021-06-06 ENCOUNTER — Ambulatory Visit (INDEPENDENT_AMBULATORY_CARE_PROVIDER_SITE_OTHER): Payer: Medicare Other | Admitting: Nurse Practitioner

## 2021-06-06 DIAGNOSIS — C3491 Malignant neoplasm of unspecified part of right bronchus or lung: Secondary | ICD-10-CM | POA: Diagnosis not present

## 2021-06-06 DIAGNOSIS — C3490 Malignant neoplasm of unspecified part of unspecified bronchus or lung: Secondary | ICD-10-CM

## 2021-06-06 NOTE — Progress Notes (Signed)
[place] [phone]   [phone]  PROGRESS NOTE FOR TELEMEDICINE VISITS  Noreene Larsson, NP [address]  [place] [address] [place] [address]  I connected withNAME@ on 06/06/21 at  2:00 PM EDT by telephone visit and verified that I am speaking with the correct person using two identifiers.   I discussed the limitations, risks, security and privacy concerns of performing an evaluation and management service by telemedicine and the availability of in-person appointments. I also discussed with the patient that there may be a patient responsible charge related to this service. The patient expressed understanding and agreed to proceed.  Other persons participating in the visit and their role in the encounter:  wife  Patient's location: Home Provider's location: [place].  DIAGNOSIS:  1) Stage IA (T1b, N0, M0) non-small cell lung cancer, well-differentiated adenocarcinoma presented with left upper lobeS pulmonary nodule diagnosed in September 2017. 2) stage Ia non-small cell lung cancer diagnosed in March 2020.   PRIOR THERAPY:  1) Status post left VATS, left upper lobectomy with mediastinal lymph node dissection under the care of Dr. Roxan Hockey on 08/01/2016. 2) status post right VATS with wedge resection of right upper lobe nodule and bleb resection under the care of Dr. Roxan Hockey on December 10, 2018.   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  INTERVAL HISTORY: Randall Sullivan 70 y.o. male has a telephone virtual visit with me today for evaluation and discussion of his recent CT scan results.  The patient was diagnosed with a stroke in June 2022 with residual weakness in the right upper and lower extremity as well as peripheral vision disturbance.  The patient is feeling  fine today with no concerning complaints.  He denied having any chest pain, shortness of breath, cough or hemoptysis.  He denied having any fever or chills.  He has no nausea, vomiting, diarrhea or constipation.  He has no headache or visual changes.  He had CT scan of the chest performed yesterday and we are having the visit today for evaluation and discussion of his scan results.  MEDICAL HISTORY: Past Medical History:  Diagnosis Date   A-fib [facility]) 2017   after last lung surgery patient had a history if Afib documented in hospital    Allergy    Arthritis    back & legs ( knees)   Cancer ([place])    lung   Complication of anesthesia    agitated upon waking from anesth.    Emphysema    History of lung cancer    Hyperlipidemia    Hypertension    Oxygen dependent    2L continuous   Tobacco abuse     ALLERGIES:  is allergic to no known allergies.  MEDICATIONS:  Current Outpatient Medications  Medication Sig Dispense Refill   acetaminophen (TYLENOL) 325 MG tablet Take 2 tablets (650 mg total) by mouth every 6 (six) hours as needed for mild pain (or Fever >/= 101).     albuterol (PROVENTIL) (2.5 MG/3ML) 0.083% nebulizer solution Take 3 mLs (2.5 mg total) by nebulization every 6 (six) hours as needed for wheezing or shortness of breath. 180 mL 3   butalbital-acetaminophen-caffeine (FIORICET) 50-325-40 MG tablet Take 1 tablet by mouth every 6 (six) hours as needed for headache or migraine. 14 tablet 0   Cholecalciferol (VITAMIN D3) 125  MCG (5000 UT) CAPS Take 1 capsule (5,000 Units total) by mouth daily. 30 capsule 0   diltiazem (CARDIZEM CD) 120 MG 24 hr capsule Take 1 capsule (120 mg total) by mouth daily. 30 capsule 0   fenofibrate (TRICOR) 145 MG tablet Take 1 tablet (145 mg total) by mouth daily. 90 tablet 1   glimepiride (AMARYL) 1 MG tablet Take 1 tablet (1 mg total) by mouth daily with breakfast. 90 tablet 0   loratadine (CLARITIN) 10 MG tablet Take 1 tablet (10 mg total) by mouth  daily. 90 tablet 0   melatonin 3 MG TABS tablet Take 2 tablets (6 mg total) by mouth at bedtime. 30 tablet 0   metoprolol succinate (TOPROL-XL) 50 MG 24 hr tablet Take 1 tablet (50 mg total) by mouth daily. Take with or immediately following a meal. 90 tablet 1   OXYGEN Inhale 2-3 L into the lungs continuous. Hx of lung ca, copd, and emphysema     polyethylene glycol (MIRALAX / GLYCOLAX) 17 g packet Take 17 g by mouth daily as needed for mild constipation. 14 each 0   pravastatin (PRAVACHOL) 40 MG tablet Take 1 tablet (40 mg total) by mouth daily. 30 tablet 0   topiramate (TOPAMAX) 25 MG tablet Take 1 tablet (25 mg total) by mouth 2 (two) times daily. (Patient not taking: Reported on 05/16/2021) 60 tablet 0   traZODone (DESYREL) 50 MG tablet Take 0.5 tablets (25 mg total) by mouth at bedtime. 45 tablet 0   warfarin (COUMADIN) 5 MG tablet Take 1 tablet (5 mg total) by mouth daily. 90 tablet 0   No current facility-administered medications for this visit.    SURGICAL HISTORY:  Past Surgical History:  Procedure Laterality Date   HERNIA REPAIR Bilateral 1027   umbilical   VIDEO ASSISTED THORACOSCOPY Right 12/21/2018   Procedure: VIDEO ASSISTED THORACOSCOPY AND RESECTION OF BLEBS RIGHT LOWER LOBE;  Surgeon: [name], MD;  Location: [place];  Service: Thoracic;  Laterality: Right;   VIDEO ASSISTED THORACOSCOPY (VATS)/ LOBECTOMY Left 08/01/2016   Procedure: VIDEO ASSISTED THORACOSCOPY (VATS)/ LEFT UPPER LOBECTOMY with lymph node sampling and onQ placement;  Surgeon: [name], MD;  Location: [place];  Service: Thoracic;  Laterality: Left;   VIDEO ASSISTED THORACOSCOPY (VATS)/WEDGE RESECTION Right 12/09/2018   Procedure: VIDEO ASSISTED THORACOSCOPY (VATS)/WEDGE RESECTION;  Surgeon: [name], MD;  Location: [place];  Service: Thoracic;  Laterality: Right;   VIDEO BRONCHOSCOPY Bilateral 12/21/2018   Procedure: VIDEO BRONCHOSCOPY;  Surgeon: [name], MD;   Location: [place];  Service: Thoracic;  Laterality: Bilateral;   VIDEO BRONCHOSCOPY WITH ENDOBRONCHIAL NAVIGATION N/A 07/03/2016   Procedure: VIDEO BRONCHOSCOPY WITH ENDOBRONCHIAL NAVIGATION;  Surgeon: [name], MD;  Location: [place];  Service: Thoracic;  Laterality: N/A;   VIDEO BRONCHOSCOPY WITH INSERTION OF INTERBRONCHIAL VALVE (IBV) N/A 12/11/2018   Procedure: VIDEO BRONCHOSCOPY WITH INSERTION OF INTERBRONCHIAL VALVE (IBV);  Surgeon: [name], MD;  Location: [facility] OR;  Service: Thoracic;  Laterality: N/A;   VIDEO BRONCHOSCOPY WITH INSERTION OF INTERBRONCHIAL VALVE (IBV) N/A 02/10/2019   Procedure: VIDEO BRONCHOSCOPY WITH REMOVAL OF INTERBRONCHIAL VALVE (IBV);  Surgeon: [name], MD;  Location[facility] OR;  Service: Thoracic;  Laterality: N/A;    REVIEW OF SYSTEMS:  A comprehensive review of systems was negative except for: Constitutional: positive for fatigue Musculoskeletal: positive for muscle weakness Neurological: positive for coordination problems, gait problems, memory problems, and weakness     LABORATORY DATA: Lab  Results  Component Value Date   WBC 7.8 06/05/2021   HGB 15.9 06/05/2021   HCT 49.9 06/05/2021   MCV 84.7 06/05/2021   PLT 232 06/05/2021      Chemistry      Component Value Date/Time   NA 140 06/05/2021 1002   NA 136 04/09/2017 1249   K 4.9 06/05/2021 1002   K 4.6 04/09/2017 1249   CL 102 06/05/2021 1002   CO2 28 06/05/2021 1002   CO2 27 04/09/2017 1249   BUN 20 06/05/2021 1002   BUN 23.9 04/09/2017 1249   CREATININE 1.40 (H) 06/05/2021 1002   CREATININE 1.21 (H) 05/08/2020 1051   CREATININE 1.4 (H) 04/09/2017 1249      Component Value Date/Time   CALCIUM 9.9 06/05/2021 1002   CALCIUM 9.6 04/09/2017 1249   ALKPHOS 39 06/05/2021 1002   ALKPHOS 58 04/09/2017 1249   AST 24 06/05/2021 1002   AST 13 04/09/2017 1249   ALT 33 06/05/2021 1002   ALT 9 04/09/2017 1249   BILITOT 0.6 06/05/2021 1002   BILITOT 1.01 04/09/2017 1249        RADIOGRAPHIC STUDIES: CT Chest Wo Contrast  Result Date: 06/05/2021 CLINICAL DATA:  Primary Cancer Type: Lung Imaging Indication: Routine surveillance Interval therapy since last imaging? No Initial Cancer Diagnosis Date: 07/03/2016; Established by: Biopsy-proven Detailed Pathology: Stage IA non-small cell lung cancer, well-differentiated adenocarcinoma. Primary Tumor location: Left upper lobe. Recurrence? Yes; Date(s) of recurrence: 10/15/2018; established by: Biopsy-proven Surgeries: Removal of intrabronchial valves 02/10/2019. RLL bleb stapled 12/21/2018. Right upper lobe wedge resection 12/09/2018. Left upper lobectomy 08/01/2016. Chemotherapy: No Immunotherapy? No Radiation therapy? No EXAM: CT CHEST WITHOUT CONTRAST TECHNIQUE: Multidetector CT imaging of the chest was performed following the standard protocol without IV contrast. COMPARISON:  Most recent CT chest 12/01/2020.  10/15/2018 PET-CT. FINDINGS: Cardiovascular: Aortic atherosclerosis. Normal heart size, without pericardial effusion. Three vessel coronary artery calcification. Mediastinum/Nodes: No supraclavicular adenopathy. No mediastinal or definite hilar adenopathy, given limitations of unenhanced CT. Lungs/Pleura: No pleural fluid. Moderate bullous emphysema. Left upper lobectomy. 4 mm right lower lobe ground-glass nodule on 58/7 is similar. Right upper lung wedge resections with similar soft tissue thickening along surgical sutures including at 11 mm on 46/7. Upper Abdomen: Mild hepatic steatosis. Normal imaged portions of the spleen, stomach, pancreas, adrenal glands. Musculoskeletal: No acute osseous abnormality. IMPRESSION: 1. Status post left upper lobectomy and right upper lung wedge resections. Similar soft tissue thickening along surgical sutures, likely postoperative. 2. No acute process or evidence of metastatic disease in the chest. 3. Aortic Atherosclerosis (ICD10-I70.0) and Emphysema (ICD10-J43.9). Coronary artery  atherosclerosis. 4. Hepatic steatosis Electronically Signed   By: Abigail Miyamoto M.D.   On: 06/05/2021 10:49    ASSESSMENT AND PLAN: This is a very pleasant 71 years old white male with a stage IA non-small cell lung cancer status post left lower lobectomy with lymph node dissection under the care of Dr. Roxan Hockey in November 2017 and has been observation since that time. He was also diagnosed with a stage Ia non-small cell lung cancer in March 2020 and the patient underwent wedge resection of the upper lobe in March 2020. The patient continues to do fine with no concerning complaints except for the recent stroke in July 2022 with residual weakness in the right side of the body more in the upper extremities and the lower extremity with imbalance issue as well as memory problems. He had repeat CT scan of the chest performed recently.  I personally and  independently reviewed the scans and discussed the results with the patient and his wife. His scan showed no concerning findings for disease recurrence or metastasis. I recommended for him to continue on observation with repeat CT scan of the chest in 1 year. The patient was advised to call immediately if he has any other concerning symptoms in the interval. I discussed the assessment and treatment plan with the patient. The patient was provided an opportunity to ask questions and all were answered. The patient agreed with the plan and demonstrated an understanding of the instructions.   The patient was advised to call back or seek an in-person evaluation if the symptoms worsen or if the condition fails to improve as anticipated.  I provided 12 minutes of non face-to-face telephone visit time during this encounter, and > 50% was spent counseling as documented under my assessment & plan.  Eilleen Kempf, MD 06/06/2021 2:51 PM  Disclaimer: This note was dictated with voice recognition software. Similar sounding words can inadvertently be transcribed and  may not be corrected upon review.

## 2021-06-07 ENCOUNTER — Telehealth: Payer: Self-pay | Admitting: Internal Medicine

## 2021-06-07 NOTE — Telephone Encounter (Signed)
Scheduled appt per 9/7 los - mailed letter with appt date and time

## 2021-06-08 ENCOUNTER — Ambulatory Visit (INDEPENDENT_AMBULATORY_CARE_PROVIDER_SITE_OTHER): Payer: Medicare Other | Admitting: *Deleted

## 2021-06-08 DIAGNOSIS — I639 Cerebral infarction, unspecified: Secondary | ICD-10-CM | POA: Diagnosis not present

## 2021-06-08 DIAGNOSIS — Z5181 Encounter for therapeutic drug level monitoring: Secondary | ICD-10-CM | POA: Diagnosis not present

## 2021-06-08 DIAGNOSIS — I480 Paroxysmal atrial fibrillation: Secondary | ICD-10-CM

## 2021-06-08 LAB — POCT INR: INR: 2.5 (ref 2.0–3.0)

## 2021-06-08 NOTE — Patient Instructions (Signed)
Continue warfarin 5mg  daily Recheck in 3 wks

## 2021-06-25 ENCOUNTER — Ambulatory Visit (INDEPENDENT_AMBULATORY_CARE_PROVIDER_SITE_OTHER): Payer: Medicare Other | Admitting: Nurse Practitioner

## 2021-06-25 ENCOUNTER — Telehealth: Payer: Self-pay | Admitting: *Deleted

## 2021-06-25 ENCOUNTER — Encounter: Payer: Self-pay | Admitting: Nurse Practitioner

## 2021-06-25 ENCOUNTER — Other Ambulatory Visit: Payer: Self-pay

## 2021-06-25 VITALS — BP 158/86 | HR 79 | Temp 97.7°F | Ht 70.0 in | Wt 214.0 lb

## 2021-06-25 DIAGNOSIS — R351 Nocturia: Secondary | ICD-10-CM | POA: Insufficient documentation

## 2021-06-25 DIAGNOSIS — E559 Vitamin D deficiency, unspecified: Secondary | ICD-10-CM | POA: Diagnosis not present

## 2021-06-25 DIAGNOSIS — I480 Paroxysmal atrial fibrillation: Secondary | ICD-10-CM

## 2021-06-25 DIAGNOSIS — I639 Cerebral infarction, unspecified: Secondary | ICD-10-CM | POA: Diagnosis not present

## 2021-06-25 DIAGNOSIS — I10 Essential (primary) hypertension: Secondary | ICD-10-CM

## 2021-06-25 DIAGNOSIS — J432 Centrilobular emphysema: Secondary | ICD-10-CM | POA: Diagnosis not present

## 2021-06-25 DIAGNOSIS — E1165 Type 2 diabetes mellitus with hyperglycemia: Secondary | ICD-10-CM | POA: Insufficient documentation

## 2021-06-25 DIAGNOSIS — Z7689 Persons encountering health services in other specified circumstances: Secondary | ICD-10-CM | POA: Diagnosis not present

## 2021-06-25 DIAGNOSIS — N401 Enlarged prostate with lower urinary tract symptoms: Secondary | ICD-10-CM | POA: Insufficient documentation

## 2021-06-25 DIAGNOSIS — E038 Other specified hypothyroidism: Secondary | ICD-10-CM | POA: Diagnosis not present

## 2021-06-25 NOTE — Assessment & Plan Note (Signed)
-  on coumadin and cardizem/metoprolol -followed by Dr. Percival Spanish

## 2021-06-25 NOTE — Chronic Care Management (AMB) (Signed)
  Chronic Care Management   Outreach Note  06/25/2021 Name: Gergory Biello MRN: [id number] DOB: 05-26-1951  Taijon Vink is a 70 y.o. year old male who is a primary care patient of Noreene Larsson, NP. I reached out to Joellyn Haff by phone today in response to a referral sent by Mr. Marrion Accomando PCP.     An unsuccessful telephone outreach was attempted today. The patient was referred to the case management team for assistance with care management and care coordination.   Follow Up Plan: A HIPAA compliant phone message was left for the patient providing contact information and requesting a return call. The care management team will reach out to the patient again over the next 7 days.  If patient returns call to provider office, please advise to call [place] at [phone].  [facility]  Direct Dial: [phone]

## 2021-06-25 NOTE — Patient Instructions (Signed)
Please have fasting labs drawn 2-3 days prior to your appointment so we can discuss the results during your office visit.  

## 2021-06-25 NOTE — Assessment & Plan Note (Signed)
-  uses albuterol PRN

## 2021-06-25 NOTE — Chronic Care Management (AMB) (Signed)
  Chronic Care Management   Note  06/25/2021 Name: Randall Sullivan MRN: [id number] DOB: 1951/05/14  [name] is a 70 y.o. year old male who is a primary care patient of Noreene Larsson, NP. I reached out to Joellyn Haff by phone today in response to a referral sent by Mr. Carnel Stegman PCP.  [name] was given information about Chronic Care Management services today including:  CCM service includes personalized support from designated clinical staff supervised by his physician, including individualized plan of care and coordination with other care providers 24/7 contact phone numbers for assistance for urgent and routine care needs. Service will only be billed when office clinical staff spend 20 minutes or more in a month to coordinate care. Only one practitioner may furnish and bill the service in a calendar month. The patient may stop CCM services at any time (effective at the end of the month) by phone call to the office staff. The patient is responsible for co-pay (up to 20% after annual deductible is met) if co-pay is required by the individual health plan.   Patient spouse Zigmond Trela, [place] on file did not agree to enrollment in care management services and does not wish to consider at this time.  Follow up plan: The care management team is available to follow up with the patient after provider conversation with the patient regarding recommendation for care management engagement and subsequent re-referral to the care management team.   [facility]  Direct Dial: [phone]

## 2021-06-25 NOTE — Progress Notes (Signed)
New Patient Office Visit  Subjective:  Patient ID: Randall Sullivan, male    DOB: 1950-12-28  Age: 70 y.o. MRN: [id number]  CC:  Chief Complaint  Patient presents with   New Patient (Initial Visit)    Establish care stroke 3 months ago , dizziness, right side stomach pain    HPI Randall Sullivan presents for new patient visit. Transferring care from [facility]. Last physical/AWV was over a year ago. Last labs with lipid panel were drawn 03/12/21.  He had left lower lobectomy in 2017 for stage 1A non-small cell lung cancer. He also had wedge resection of the upper lobe in March 2022 for stage 1A non-small cell lung cancer.  He is followed by Oncology, Dr. Earlie Server.  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 He has a-fib and is anticoagulated with coumadin for this. He is followed by Dr. Merlene Laughter for neurology and he completed PT/OT at home, and he states he has transportation issues, so he didn't complete outpatient PT/OT. He has decreased visual fields (limited right peripheral vision).   For A-fib, he is followed by Dr. Percival Spanish.    Past Medical History:  Diagnosis Date   A-fib ([facility]) 2017   after last lung surgery patient had a history if Afib documented in hospital    Allergy    Arthritis    back & legs ( knees)   Cancer ([place])    lung   Complication of anesthesia    agitated upon waking from anesth.    Emphysema    History of lung cancer    Hyperlipidemia    Hypertension    Oxygen dependent    2L continuous   Stroke ([place])    Tobacco abuse     Past Surgical History:  Procedure Laterality Date   HERNIA REPAIR Bilateral 7416   umbilical   VIDEO ASSISTED THORACOSCOPY Right 12/21/2018   Procedure: VIDEO ASSISTED THORACOSCOPY AND RESECTION OF BLEBS RIGHT LOWER LOBE;  Surgeon: [name], MD;  Location: [place];  Service: Thoracic;  Laterality: Right;   VIDEO ASSISTED THORACOSCOPY (VATS)/ LOBECTOMY Left 08/01/2016   Procedure: VIDEO ASSISTED THORACOSCOPY (VATS)/ LEFT  UPPER LOBECTOMY with lymph node sampling and onQ placement;  Surgeon: [name], MD;  Location: [place];  Service: Thoracic;  Laterality: Left;   VIDEO ASSISTED THORACOSCOPY (VATS)/WEDGE RESECTION Right 12/09/2018   Procedure: VIDEO ASSISTED THORACOSCOPY (VATS)/WEDGE RESECTION;  Surgeon: [name], MD;  Location: [place];  Service: Thoracic;  Laterality: Right;   VIDEO BRONCHOSCOPY Bilateral 12/21/2018   Procedure: VIDEO BRONCHOSCOPY;  Surgeon: [name], MD;  Location: [place];  Service: Thoracic;  Laterality: Bilateral;   VIDEO BRONCHOSCOPY WITH ENDOBRONCHIAL NAVIGATION N/A 07/03/2016   Procedure: VIDEO BRONCHOSCOPY WITH ENDOBRONCHIAL NAVIGATION;  Surgeon: [name], MD;  Location: [place];  Service: Thoracic;  Laterality: N/A;   VIDEO BRONCHOSCOPY WITH INSERTION OF INTERBRONCHIAL VALVE (IBV) N/A 12/11/2018   Procedure: VIDEO BRONCHOSCOPY WITH INSERTION OF INTERBRONCHIAL VALVE (IBV);  Surgeon: [name], MD;  Location: [facility] OR;  Service: Thoracic;  Laterality: N/A;   VIDEO BRONCHOSCOPY WITH INSERTION OF INTERBRONCHIAL VALVE (IBV) N/A 02/10/2019   Procedure: VIDEO BRONCHOSCOPY WITH REMOVAL OF INTERBRONCHIAL VALVE (IBV);  Surgeon: [name], MD;  Location: [facility] OR;  Service: Thoracic;  Laterality: N/A;    Family History  Problem Relation Age of Onset   Cancer Mother    Cancer Sister    Aneurysm Sister    Kidney disease Paternal Aunt  Asthma Brother    Cancer - Lung Brother    Cancer Brother     Social History   Socioeconomic History   Marital status: Married    Spouse name: Not on file   Number of children: 2   Years of education: Not on file   Highest education level: Not on file  Occupational History   Occupation: Vinyl siding   Occupation: Retired  Tobacco Use   Smoking status: Former    Packs/day: 1.00    Years: 40.00    Pack years: 40.00    Types: Cigarettes    Quit date: 05/28/2016    Years since quitting: 5.0    Smokeless tobacco: Never  Vaping Use   Vaping Use: Never used  Substance and Sexual Activity   Alcohol use: Not Currently    Comment: quit- 2016   Drug use: No   Sexual activity: Not Currently  Other Topics Concern   Not on file  Social History Narrative   Married for 66 years,second.Lives with wife. 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   Social Determinants of Health   Financial Resource Strain: Not on file  Food Insecurity: Not on file  Transportation Needs: Not on file  Physical Activity: Not on file  Stress: Not on file  Social Connections: Not on file  Intimate Partner Violence: Not on file    ROS Review of Systems  Constitutional: Negative.   Respiratory: Negative.    Cardiovascular: Negative.   Neurological:  Positive for dizziness.       Visual field changes since having stroke, this is his new baseline  Psychiatric/Behavioral: Negative.     Objective:   Today's Vitals: BP (!) 158/86 (BP Location: Right Arm, Patient Position: Sitting, Cuff Size: Large)   Pulse 79   Temp 97.7 F (36.5 C) (Oral)   Ht $R'5\' 10"'mS$  (1.778 m)   Wt 214 lb (97.1 kg)   SpO2 95%   BMI 30.71 kg/m   Physical Exam Constitutional:      Appearance: Normal appearance.  Cardiovascular:     Rate and Rhythm: Normal rate. Rhythm irregular.     Pulses: Normal pulses.     Heart sounds: Normal heart sounds.  Pulmonary:     Effort: Pulmonary effort is normal.     Comments: Diminished lung sounds Musculoskeletal:     Comments: Cane for ambulation  Neurological:     Mental Status: He is alert and oriented to person, place, and time.     Sensory: Sensory deficit present.     Comments: Diminished right peripheral vision    Assessment & Plan:   Problem List Items Addressed This Visit       Cardiovascular and Mediastinum   Essential (primary) hypertension    BP Readings from Last 3 Encounters:  06/25/21 (!) 158/86  05/16/21 132/78  04/05/21 105/70  -BP elevated today       Relevant Orders   CBC with Differential/Platelet   CMP14+EGFR   Lipid Panel With LDL/HDL Ratio   PAF (paroxysmal atrial fibrillation) (HCC)    -on coumadin and cardizem/metoprolol -followed by Dr. Percival Spanish      CVA (cerebral vascular accident) [facility])    -has residual deficits like visual field changes, decrease short-term memory -had some home PT/OT but didn't start outpatient PT/OT d/t transportation issues -will set up with care coordinator, maybe get [facility]ATS so he can continue therapy      Relevant Orders   CBC with Differential/Platelet   CMP14+EGFR   Lipid Panel With LDL/HDL  Ratio   Ambulatory referral to Physical Therapy   Ambulatory referral to Occupational Therapy   AMB Referral to Community Care Coordinaton     Respiratory   Emphysema of lung ([place])    -uses albuterol PRN        Endocrine   TSH (thyroid-stimulating hormone deficiency)    -check TSH with next labs      Relevant Orders   TSH   Type II diabetes mellitus, uncontrolled ([place])    -check A1c -has been taking glimepiride -he states his blood sugar was elevated when he had a stroke, but has been well since      Relevant Orders   CBC with Differential/Platelet   CMP14+EGFR   Lipid Panel With LDL/HDL Ratio   Hemoglobin A1c     Other   Nocturia    -check PSA -may consider medication at next OV based on labs      Relevant Orders   PSA   Other Visit Diagnoses     Establishing care with new doctor, encounter for    -  Primary   Relevant Orders   CBC with Differential/Platelet   CMP14+EGFR   Lipid Panel With LDL/HDL Ratio   Hemoglobin A1c   PSA   VITAMIN D 25 Hydroxy (Vit-D Deficiency, Fractures)   Vitamin D deficiency       Relevant Orders   VITAMIN D 25 Hydroxy (Vit-D Deficiency, Fractures)       Outpatient Encounter Medications as of 06/25/2021  Medication Sig   acetaminophen (TYLENOL) 325 MG tablet Take 2 tablets (650 mg total) by mouth every 6 (six) hours as needed for mild pain (or  Fever >/= 101).   albuterol (PROVENTIL) (2.5 MG/3ML) 0.083% nebulizer solution Take 3 mLs (2.5 mg total) by nebulization every 6 (six) hours as needed for wheezing or shortness of breath.   Cholecalciferol (VITAMIN D3) 125 MCG (5000 UT) CAPS Take 1 capsule (5,000 Units total) by mouth daily.   diltiazem (CARDIZEM CD) 120 MG 24 hr capsule Take 1 capsule (120 mg total) by mouth daily.   fenofibrate (TRICOR) 145 MG tablet Take 1 tablet (145 mg total) by mouth daily.   glimepiride (AMARYL) 1 MG tablet Take 1 tablet (1 mg total) by mouth daily with breakfast.   loratadine (CLARITIN) 10 MG tablet Take 1 tablet (10 mg total) by mouth daily.   metoprolol succinate (TOPROL-XL) 50 MG 24 hr tablet Take 1 tablet (50 mg total) by mouth daily. Take with or immediately following a meal.   OXYGEN Inhale 2-3 L into the lungs continuous. Hx of lung ca, copd, and emphysema   polyethylene glycol (MIRALAX / GLYCOLAX) 17 g packet Take 17 g by mouth daily as needed for mild constipation.   traZODone (DESYREL) 50 MG tablet Take 0.5 tablets (25 mg total) by mouth at bedtime.   warfarin (COUMADIN) 5 MG tablet Take 1 tablet (5 mg total) by mouth daily.   [DISCONTINUED] butalbital-acetaminophen-caffeine (FIORICET) 50-325-40 MG tablet Take 1 tablet by mouth every 6 (six) hours as needed for headache or migraine.   pravastatin (PRAVACHOL) 40 MG tablet Take 1 tablet (40 mg total) by mouth daily.   [DISCONTINUED] melatonin 3 MG TABS tablet Take 2 tablets (6 mg total) by mouth at bedtime. (Patient not taking: Reported on 06/25/2021)   [DISCONTINUED] topiramate (TOPAMAX) 25 MG tablet Take 1 tablet (25 mg total) by mouth 2 (two) times daily. (Patient not taking: Reported on 06/25/2021)   No facility-administered encounter medications on file as of  06/25/2021.    Follow-up: Return in about 2 weeks (around 07/09/2021) for Lab follow-up (DM, CVA, HTN, HLD, thyroid, vit D).   Noreene Larsson, NP

## 2021-06-25 NOTE — Assessment & Plan Note (Signed)
-  check PSA -may consider medication at next OV based on labs

## 2021-06-25 NOTE — Assessment & Plan Note (Signed)
-  check TSH with next labs

## 2021-06-25 NOTE — Assessment & Plan Note (Signed)
BP Readings from Last 3 Encounters:  06/25/21 (!) 158/86  05/16/21 132/78  04/05/21 105/70   -BP elevated today

## 2021-06-25 NOTE — Assessment & Plan Note (Signed)
-  has residual deficits like visual field changes, decrease short-term memory -had some home PT/OT but didn't start outpatient PT/OT d/t transportation issues -will set up with care coordinator, maybe get [facility] so he can continue therapy

## 2021-06-25 NOTE — Assessment & Plan Note (Signed)
-  check A1c -has been taking glimepiride -he states his blood sugar was elevated when he had a stroke, but has been well since

## 2021-06-28 ENCOUNTER — Ambulatory Visit (INDEPENDENT_AMBULATORY_CARE_PROVIDER_SITE_OTHER): Payer: Medicare Other | Admitting: *Deleted

## 2021-06-28 DIAGNOSIS — Z5181 Encounter for therapeutic drug level monitoring: Secondary | ICD-10-CM

## 2021-06-28 DIAGNOSIS — I639 Cerebral infarction, unspecified: Secondary | ICD-10-CM | POA: Diagnosis not present

## 2021-06-28 DIAGNOSIS — I480 Paroxysmal atrial fibrillation: Secondary | ICD-10-CM | POA: Diagnosis not present

## 2021-06-28 LAB — POCT INR: INR: 2.4 (ref 2.0–3.0)

## 2021-06-28 NOTE — Patient Instructions (Signed)
Continue warfarin 5mg  daily Recheck in 4 wks

## 2021-07-01 ENCOUNTER — Encounter: Payer: Self-pay | Admitting: Nurse Practitioner

## 2021-07-01 ENCOUNTER — Other Ambulatory Visit (INDEPENDENT_AMBULATORY_CARE_PROVIDER_SITE_OTHER): Payer: Self-pay | Admitting: Nurse Practitioner

## 2021-07-01 DIAGNOSIS — I480 Paroxysmal atrial fibrillation: Secondary | ICD-10-CM

## 2021-07-01 DIAGNOSIS — Z8673 Personal history of transient ischemic attack (TIA), and cerebral infarction without residual deficits: Secondary | ICD-10-CM

## 2021-07-02 ENCOUNTER — Other Ambulatory Visit: Payer: Self-pay

## 2021-07-02 DIAGNOSIS — E785 Hyperlipidemia, unspecified: Secondary | ICD-10-CM

## 2021-07-02 MED ORDER — FENOFIBRATE 145 MG PO TABS: 145.0000 mg | ORAL_TABLET | Freq: Every day | ORAL | 1 refills | Status: DC

## 2021-07-02 MED ORDER — WARFARIN SODIUM 5 MG PO TABS: 5.0000 mg | ORAL_TABLET | Freq: Every day | ORAL | 0 refills | Status: DC

## 2021-07-02 NOTE — Telephone Encounter (Signed)
Refills sent

## 2021-07-05 DIAGNOSIS — E1165 Type 2 diabetes mellitus with hyperglycemia: Secondary | ICD-10-CM | POA: Diagnosis not present

## 2021-07-05 DIAGNOSIS — I639 Cerebral infarction, unspecified: Secondary | ICD-10-CM | POA: Diagnosis not present

## 2021-07-05 DIAGNOSIS — E038 Other specified hypothyroidism: Secondary | ICD-10-CM | POA: Diagnosis not present

## 2021-07-05 DIAGNOSIS — Z7689 Persons encountering health services in other specified circumstances: Secondary | ICD-10-CM | POA: Diagnosis not present

## 2021-07-05 DIAGNOSIS — R351 Nocturia: Secondary | ICD-10-CM | POA: Diagnosis not present

## 2021-07-05 DIAGNOSIS — I10 Essential (primary) hypertension: Secondary | ICD-10-CM | POA: Diagnosis not present

## 2021-07-05 DIAGNOSIS — E559 Vitamin D deficiency, unspecified: Secondary | ICD-10-CM | POA: Diagnosis not present

## 2021-07-06 LAB — CBC WITH DIFFERENTIAL/PLATELET
Basophils Absolute: 0 10*3/uL (ref 0.0–0.2)
Basos: 1 %
EOS (ABSOLUTE): 0.1 10*3/uL (ref 0.0–0.4)
Eos: 2 %
Hematocrit: 51.9 % — ABNORMAL HIGH (ref 37.5–51.0)
Hemoglobin: 16.4 g/dL (ref 13.0–17.7)
Immature Grans (Abs): 0 10*3/uL (ref 0.0–0.1)
Immature Granulocytes: 0 %
Lymphocytes Absolute: 1.3 10*3/uL (ref 0.7–3.1)
Lymphs: 21 %
MCH: 26.5 pg — ABNORMAL LOW (ref 26.6–33.0)
MCHC: 31.6 g/dL (ref 31.5–35.7)
MCV: 84 fL (ref 79–97)
Monocytes Absolute: 0.7 10*3/uL (ref 0.1–0.9)
Monocytes: 12 %
Neutrophils Absolute: 3.9 10*3/uL (ref 1.4–7.0)
Neutrophils: 64 %
Platelets: 248 10*3/uL (ref 150–450)
RBC: 6.18 x10E6/uL — ABNORMAL HIGH (ref 4.14–5.80)
RDW: 13.6 % (ref 11.6–15.4)
WBC: 6.1 10*3/uL (ref 3.4–10.8)

## 2021-07-06 LAB — VITAMIN D 25 HYDROXY (VIT D DEFICIENCY, FRACTURES): Vit D, 25-Hydroxy: 50.6 ng/mL (ref 30.0–100.0)

## 2021-07-06 LAB — LIPID PANEL WITH LDL/HDL RATIO
Cholesterol, Total: 270 mg/dL — ABNORMAL HIGH (ref 100–199)
HDL: 33 mg/dL — ABNORMAL LOW (ref >39)
LDL Chol Calc (NIH): 149 mg/dL — ABNORMAL HIGH (ref 0–99)
LDL/HDL Ratio: 4.5 ratio — ABNORMAL HIGH (ref 0.0–3.6)
Triglycerides: 465 mg/dL — ABNORMAL HIGH (ref 0–149)
VLDL Cholesterol Cal: 88 mg/dL — ABNORMAL HIGH (ref 5–40)

## 2021-07-06 LAB — CMP14+EGFR
ALT: 29 IU/L (ref 0–44)
AST: 26 IU/L (ref 0–40)
Albumin/Globulin Ratio: 1.7 (ref 1.2–2.2)
Albumin: 4.8 g/dL — ABNORMAL HIGH (ref 3.7–4.7)
Alkaline Phosphatase: 48 IU/L (ref 44–121)
BUN/Creatinine Ratio: 16 (ref 10–24)
BUN: 20 mg/dL (ref 8–27)
Bilirubin Total: 0.5 mg/dL (ref 0.0–1.2)
CO2: 24 mmol/L (ref 20–29)
Calcium: 10.1 mg/dL (ref 8.6–10.2)
Chloride: 97 mmol/L (ref 96–106)
Creatinine, Ser: 1.29 mg/dL — ABNORMAL HIGH (ref 0.76–1.27)
Globulin, Total: 2.9 g/dL (ref 1.5–4.5)
Glucose: 83 mg/dL (ref 70–99)
Potassium: 4.6 mmol/L (ref 3.5–5.2)
Sodium: 139 mmol/L (ref 134–144)
Total Protein: 7.7 g/dL (ref 6.0–8.5)
eGFR: 59 mL/min/{1.73_m2} — ABNORMAL LOW (ref >59)

## 2021-07-06 LAB — HEMOGLOBIN A1C
Est. average glucose Bld gHb Est-mCnc: 114 mg/dL
Hgb A1c MFr Bld: 5.6 % (ref 4.8–5.6)

## 2021-07-06 LAB — PSA: Prostate Specific Ag, Serum: 3.5 ng/mL (ref 0.0–4.0)

## 2021-07-06 LAB — TSH: TSH: 1.59 u[IU]/mL (ref 0.450–4.500)

## 2021-07-06 NOTE — Progress Notes (Signed)
Trigs, choelsterol, and RBC are elevated. You will see him on 10/10.

## 2021-07-09 ENCOUNTER — Other Ambulatory Visit (INDEPENDENT_AMBULATORY_CARE_PROVIDER_SITE_OTHER): Payer: Self-pay | Admitting: Nurse Practitioner

## 2021-07-09 ENCOUNTER — Ambulatory Visit (INDEPENDENT_AMBULATORY_CARE_PROVIDER_SITE_OTHER): Payer: Medicare Other | Admitting: Internal Medicine

## 2021-07-09 ENCOUNTER — Encounter: Payer: Self-pay | Admitting: Internal Medicine

## 2021-07-09 ENCOUNTER — Other Ambulatory Visit: Payer: Self-pay

## 2021-07-09 VITALS — BP 151/83 | HR 60 | Temp 97.8°F | Resp 18 | Ht 70.0 in | Wt 216.0 lb

## 2021-07-09 DIAGNOSIS — E785 Hyperlipidemia, unspecified: Secondary | ICD-10-CM

## 2021-07-09 DIAGNOSIS — N401 Enlarged prostate with lower urinary tract symptoms: Secondary | ICD-10-CM

## 2021-07-09 DIAGNOSIS — R351 Nocturia: Secondary | ICD-10-CM | POA: Diagnosis not present

## 2021-07-09 DIAGNOSIS — R42 Dizziness and giddiness: Secondary | ICD-10-CM | POA: Diagnosis not present

## 2021-07-09 DIAGNOSIS — Z9109 Other allergy status, other than to drugs and biological substances: Secondary | ICD-10-CM | POA: Insufficient documentation

## 2021-07-09 DIAGNOSIS — E1169 Type 2 diabetes mellitus with other specified complication: Secondary | ICD-10-CM | POA: Insufficient documentation

## 2021-07-09 DIAGNOSIS — I700 Atherosclerosis of aorta: Secondary | ICD-10-CM

## 2021-07-09 DIAGNOSIS — Z8673 Personal history of transient ischemic attack (TIA), and cerebral infarction without residual deficits: Secondary | ICD-10-CM

## 2021-07-09 DIAGNOSIS — I10 Essential (primary) hypertension: Secondary | ICD-10-CM

## 2021-07-09 DIAGNOSIS — I480 Paroxysmal atrial fibrillation: Secondary | ICD-10-CM

## 2021-07-09 DIAGNOSIS — E119 Type 2 diabetes mellitus without complications: Secondary | ICD-10-CM | POA: Insufficient documentation

## 2021-07-09 MED ORDER — MECLIZINE HCL 25 MG PO TABS: 25.0000 mg | ORAL_TABLET | Freq: Three times a day (TID) | ORAL | 0 refills | Status: DC | PRN

## 2021-07-09 MED ORDER — TAMSULOSIN HCL 0.4 MG PO CAPS: 0.4000 mg | ORAL_CAPSULE | Freq: Every day | ORAL | 3 refills | Status: DC

## 2021-07-09 MED ORDER — LORATADINE 10 MG PO TABS: 10.0000 mg | ORAL_TABLET | Freq: Every day | ORAL | 0 refills | Status: DC

## 2021-07-09 MED ORDER — PRAVASTATIN SODIUM 80 MG PO TABS: 80.0000 mg | ORAL_TABLET | Freq: Every day | ORAL | 5 refills | Status: DC

## 2021-07-09 NOTE — Assessment & Plan Note (Signed)
Well-controlled with Claritin, refilled

## 2021-07-09 NOTE — Assessment & Plan Note (Signed)
Started Tamsulosin

## 2021-07-09 NOTE — Assessment & Plan Note (Signed)
Lab Results  Component Value Date   HGBA1C 5.6 07/05/2021   On Glimepiride 1 mg QD, has been having dizziness and blood glucose readings have been in 70s-80s DC Glimepiride as suspicion of hypoglycemia causing/worsening dizziness Would prefer to start Metformin or SGLT2 inhibitor if HbA1C increases now Increased dose of statin Needs to see Ophthalmologist for diabetic eye exam

## 2021-07-09 NOTE — Assessment & Plan Note (Signed)
BP Readings from Last 1 Encounters:  07/09/21 (!) 151/83   Remains WNL at home Elevated today, on Metoprolol and Diltiazem Plan to add ARB if persistently elevated, had cough with ACEi Counseled for compliance with the medications Advised DASH diet and moderate exercise/walking, at least 150 mins/week

## 2021-07-09 NOTE — Assessment & Plan Note (Signed)
Increased statin for now for HLD BP needs to be better controlled

## 2021-07-09 NOTE — Progress Notes (Signed)
Established Patient Office Visit  Subjective:  Patient ID: Randall Sullivan, male    DOB: October 27, 1950  Age: 70 y.o. MRN: [id number]  CC:  Chief Complaint  Patient presents with  . Dizziness    Pt has stated that he is very dizzy today worse than other days.    . Diabetes  . Hyperlipidemia  . Hypertension    HPI Randall Sullivan presents for c/o dizziness and f/u of his chronic medical conditions.  Dizziness: He has been having dizziness for the last few days. He has chronic peripheral vision problem as well. His blood glucose has been running in 70s-80s most of the times. He has intermittent nausea and abdominal discomfort as well. Denies any recent fall.  HTN: His BP was elevated in the office today. His BP remains WNL at home. Takes medications regularly. Patient denies headache, chest pain, dyspnea or palpitations.  Type 2 DM: Has been taking Glimepiride regularly. His HbA1C was 5.6. Denies any polydipsia or dysuria.  He reports nocturia, but denies any urinary hesitancy or hematuria. He has noticed postvoid dribbling at times.  Blood tests were reviewed and discussed with the patient in detail.   Past Medical History:  Diagnosis Date  . A-fib ([place]) 2017   after last lung surgery patient had a history if Afib documented in hospital   . Allergy   . Arthritis    back & legs ( knees)  . Cancer ([place])    lung  . Complication of anesthesia    agitated upon waking from anesth.   . Emphysema   . History of lung cancer   . Hyperlipidemia   . Hypertension   . Oxygen dependent    2L continuous  . Stroke ([place])   . Tobacco abuse     Past Surgical History:  Procedure Laterality Date  . HERNIA REPAIR Bilateral 2376   umbilical  . VIDEO ASSISTED THORACOSCOPY Right 12/21/2018   Procedure: VIDEO ASSISTED THORACOSCOPY AND RESECTION OF BLEBS RIGHT LOWER LOBE;  Surgeon: [name], MD;  Location: [place];  Service: Thoracic;  Laterality: Right;  [name] VIDEO ASSISTED THORACOSCOPY  (VATS)/ LOBECTOMY Left 08/01/2016   Procedure: VIDEO ASSISTED THORACOSCOPY (VATS)/ LEFT UPPER LOBECTOMY with lymph node sampling and onQ placement;  Surgeon: [name], MD;  Location: [place];  Service: Thoracic;  Laterality: Left;  [name] VIDEO ASSISTED THORACOSCOPY (VATS)/WEDGE RESECTION Right 12/09/2018   Procedure: VIDEO ASSISTED THORACOSCOPY (VATS)/WEDGE RESECTION;  Surgeon: [name], MD;  Location: [place];  Service: Thoracic;  Laterality: Right;  [name] VIDEO BRONCHOSCOPY Bilateral 12/21/2018   Procedure: VIDEO BRONCHOSCOPY;  Surgeon: [name], MD;  Location: [place];  Service: Thoracic;  Laterality: Bilateral;  . VIDEO BRONCHOSCOPY WITH ENDOBRONCHIAL NAVIGATION N/A 07/03/2016   Procedure: VIDEO BRONCHOSCOPY WITH ENDOBRONCHIAL NAVIGATION;  Surgeon: [name], MD;  Location: [place];  Service: Thoracic;  Laterality: N/A;  . VIDEO BRONCHOSCOPY WITH INSERTION OF INTERBRONCHIAL VALVE (IBV) N/A 12/11/2018   Procedure: VIDEO BRONCHOSCOPY WITH INSERTION OF INTERBRONCHIAL VALVE (IBV);  Surgeon: [name], MD;  Location: [facility] OR;  Service: Thoracic;  Laterality: N/A;  . VIDEO BRONCHOSCOPY WITH INSERTION OF INTERBRONCHIAL VALVE (IBV) N/A 02/10/2019   Procedure: VIDEO BRONCHOSCOPY WITH REMOVAL OF INTERBRONCHIAL VALVE (IBV);  Surgeon: [name], MD;  Location[facility] OR;  Service: Thoracic;  Laterality: N/A;    Family History  Problem Relation Age of Onset  . Cancer Mother   . Cancer Sister   . Aneurysm Sister   .  Kidney disease Paternal Aunt   . Asthma Brother   . Cancer - Lung Brother   . Cancer Brother     Social History   Socioeconomic History  . Marital status: Married    Spouse name: Not on file  . Number of children: 2  . Years of education: Not on file  . Highest education level: Not on file  Occupational History  . Occupation: Agricultural engineer  . Occupation: Retired  Tobacco Use  . Smoking status: Former    Packs/day: 1.00     Years: 40.00    Pack years: 40.00    Types: Cigarettes    Quit date: 05/28/2016    Years since quitting: 5.1  . Smokeless tobacco: Never  Vaping Use  . Vaping Use: Never used  Substance and Sexual Activity  . Alcohol use: Not Currently    Comment: quit- 2016  . Drug use: No  . Sexual activity: Not Currently  Other Topics Concern  . Not on file  Social History Narrative   Married for 84 years,second.Lives with wife. 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   Social Determinants of Health   Financial Resource Strain: Not on file  Food Insecurity: Not on file  Transportation Needs: Not on file  Physical Activity: Not on file  Stress: Not on file  Social Connections: Not on file  Intimate Partner Violence: Not on file    Outpatient Medications Prior to Visit  Medication Sig Dispense Refill  . acetaminophen (TYLENOL) 325 MG tablet Take 2 tablets (650 mg total) by mouth every 6 (six) hours as needed for mild pain (or Fever >/= 101).    [name] albuterol (PROVENTIL) (2.5 MG/3ML) 0.083% nebulizer solution Take 3 mLs (2.5 mg total) by nebulization every 6 (six) hours as needed for wheezing or shortness of breath. 180 mL 3  . Cholecalciferol (VITAMIN D3) 125 MCG (5000 UT) CAPS Take 1 capsule (5,000 Units total) by mouth daily. 30 capsule 0  . diltiazem (CARDIZEM CD) 120 MG 24 hr capsule Take 1 capsule (120 mg total) by mouth daily. 30 capsule 0  . fenofibrate (TRICOR) 145 MG tablet Take 1 tablet (145 mg total) by mouth daily. 90 tablet 1  . metoprolol succinate (TOPROL-XL) 50 MG 24 hr tablet Take 1 tablet (50 mg total) by mouth daily. Take with or immediately following a meal. 90 tablet 1  . OXYGEN Inhale 2-3 L into the lungs continuous. Hx of lung ca, copd, and emphysema    . polyethylene glycol (MIRALAX / GLYCOLAX) 17 g packet Take 17 g by mouth daily as needed for mild constipation. 14 each 0  . traZODone (DESYREL) 50 MG tablet Take 0.5 tablets (25 mg total) by mouth at bedtime. 45 tablet 0  .  warfarin (COUMADIN) 5 MG tablet Take 1 tablet (5 mg total) by mouth daily. 90 tablet 0  . glimepiride (AMARYL) 1 MG tablet Take 1 tablet (1 mg total) by mouth daily with breakfast. 90 tablet 0  . loratadine (CLARITIN) 10 MG tablet Take 1 tablet (10 mg total) by mouth daily. 90 tablet 0  . pravastatin (PRAVACHOL) 40 MG tablet Take 1 tablet (40 mg total) by mouth daily. 30 tablet 0   No facility-administered medications prior to visit.    Allergies  Allergen Reactions  . No Known Allergies     ROS Review of Systems  Constitutional:  Positive for fatigue. Negative for chills and fever.  HENT:  Negative for congestion, sinus pressure and sinus pain.   Eyes:  Positive for visual disturbance. Negative for redness.  Respiratory:  Negative for cough and shortness of breath.   Cardiovascular:  Negative for chest pain and palpitations.  Gastrointestinal:  Positive for nausea. Negative for vomiting.  Genitourinary:  Negative for dysuria and hematuria.  Musculoskeletal:  Negative for neck pain and neck stiffness.  Skin:  Negative for rash.  Neurological:  Positive for dizziness. Negative for tremors, seizures and headaches.  Psychiatric/Behavioral:  Negative for agitation and behavioral problems.      Objective:    Physical Exam Vitals reviewed.  Constitutional:      General: He is not in acute distress.    Appearance: He is not diaphoretic.  HENT:     Head: Normocephalic and atraumatic.     Nose: Nose normal.     Mouth/Throat:     Mouth: Mucous membranes are moist.  Eyes:     General: No scleral icterus.    Extraocular Movements: Extraocular movements intact.  Cardiovascular:     Rate and Rhythm: Normal rate and regular rhythm.     Pulses: Normal pulses.     Heart sounds: Normal heart sounds. No murmur heard. Pulmonary:     Breath sounds: Normal breath sounds. No wheezing or rales.  Abdominal:     Palpations: Abdomen is soft.     Tenderness: There is no abdominal tenderness.   Musculoskeletal:     Cervical back: Neck supple. No tenderness.     Right lower leg: No edema.     Left lower leg: No edema.  Skin:    General: Skin is warm.     Findings: No rash.  Neurological:     General: No focal deficit present.     Mental Status: He is alert and oriented to person, place, and time. Mental status is at baseline.     Sensory: No sensory deficit.  Psychiatric:        Mood and Affect: Mood normal.        Behavior: Behavior normal.    BP (!) 151/83   Pulse 60   Temp 97.8 F (36.6 C)   Resp 18   Ht _0  (1.778 m)   Wt 216 lb (98 kg)   SpO2 95%   BMI 30.99 kg/m  Wt Readings from Last 3 Encounters:  07/09/21 216 lb (98 kg)  06/25/21 214 lb (97.1 kg)  05/16/21 212 lb (96.2 kg)     Health Maintenance Due  Topic Date Due  . FOOT EXAM  Never done  . OPHTHALMOLOGY EXAM  Never done  . URINE MICROALBUMIN  Never done  . Hepatitis C Screening  Never done  . Zoster Vaccines- Shingrix (1 of 2) Never done  . COLONOSCOPY (Pts 45-31yr Insurance coverage will need to be confirmed)  Never done  . COVID-19 Vaccine (3 - Moderna risk series) 02/10/2020    There are no preventive care reminders to display for this patient.  Lab Results  Component Value Date   TSH 1.590 07/05/2021   Lab Results  Component Value Date   WBC 6.1 07/05/2021   HGB 16.4 07/05/2021   HCT 51.9 (H) 07/05/2021   MCV 84 07/05/2021   PLT 248 07/05/2021   Lab Results  Component Value Date   NA 139 07/05/2021   K 4.6 07/05/2021   CHLORIDE 99 04/09/2017   CO2 24 07/05/2021   GLUCOSE 83 07/05/2021   BUN 20 07/05/2021   CREATININE 1.29 (H) 07/05/2021   BILITOT 0.5 07/05/2021   ALKPHOS 48  07/05/2021   AST 26 07/05/2021   ALT 29 07/05/2021   PROT 7.7 07/05/2021   ALBUMIN 4.8 (H) 07/05/2021   CALCIUM 10.1 07/05/2021   ANIONGAP 10 06/05/2021   EGFR 59 (L) 07/05/2021   Lab Results  Component Value Date   CHOL 270 (H) 07/05/2021   Lab Results  Component Value Date   HDL 33  (L) 07/05/2021   Lab Results  Component Value Date   LDLCALC 149 (H) 07/05/2021   Lab Results  Component Value Date   TRIG 465 (H) 07/05/2021   Lab Results  Component Value Date   CHOLHDL 8.3 03/12/2021   Lab Results  Component Value Date   HGBA1C 5.6 07/05/2021      Assessment & Plan:   Problem List Items Addressed This Visit       Cardiovascular and Mediastinum   Essential (primary) hypertension - Primary    BP Readings from Last 1 Encounters:  07/09/21 (!) 151/83  Remains WNL at home Elevated today, on Metoprolol and Diltiazem Plan to add ARB if persistently elevated, had cough with ACEi Counseled for compliance with the medications Advised DASH diet and moderate exercise/walking, at least 150 mins/week       Relevant Medications   pravastatin (PRAVACHOL) 80 MG tablet   Other Relevant Orders   AMB Referral to Community Care Coordinaton   Aortic atherosclerosis ([place])    Increased statin for now for HLD BP needs to be better controlled      Relevant Medications   pravastatin (PRAVACHOL) 80 MG tablet     Endocrine   Diabetes mellitus ([place])    Lab Results  Component Value Date   HGBA1C 5.6 07/05/2021  On Glimepiride 1 mg QD, has been having dizziness and blood glucose readings have been in 70s-80s DC Glimepiride as suspicion of hypoglycemia causing/worsening dizziness Would prefer to start Metformin or SGLT2 inhibitor if HbA1C increases now Increased dose of statin Needs to see Ophthalmologist for diabetic eye exam      Relevant Medications   pravastatin (PRAVACHOL) 80 MG tablet   Other Relevant Orders   AMB Referral to Community Care Coordinaton   Microalbumin, urine     Other   Hyperlipidemia    Lipid profile reviewed Increased Pravastatin dose to 80 mg QD Would prefer to add Vascepa, but cost is a limiting factor currently - referred to CCM for medication assistance      Relevant Medications   pravastatin (PRAVACHOL) 80 MG tablet   Other  Relevant Orders   AMB Referral to Community Care Coordinaton   Benign prostatic hyperplasia with nocturia    Started Tamsulosin      Relevant Medications   tamsulosin (FLOMAX) 0.4 MG CAPS capsule   History of environmental allergies    Well-controlled with Claritin, refilled      Relevant Medications   loratadine (CLARITIN) 10 MG tablet   Vertigo    Dizziness likely due to vertigo, could have worsened with hypoglycemia at times Meclizine PRN Small, frequent meals Avoid sudden positional changes      Relevant Medications   meclizine (ANTIVERT) 25 MG tablet    Meds ordered this encounter  Medications  . pravastatin (PRAVACHOL) 80 MG tablet    Sig: Take 1 tablet (80 mg total) by mouth daily.    Dispense:  30 tablet    Refill:  5  . loratadine (CLARITIN) 10 MG tablet    Sig: Take 1 tablet (10 mg total) by mouth daily.    Dispense:  90 tablet    Refill:  0  . tamsulosin (FLOMAX) 0.4 MG CAPS capsule    Sig: Take 1 capsule (0.4 mg total) by mouth daily.    Dispense:  30 capsule    Refill:  3  . meclizine (ANTIVERT) 25 MG tablet    Sig: Take 1 tablet (25 mg total) by mouth 3 (three) times daily as needed for dizziness.    Dispense:  30 tablet    Refill:  0    Follow-up: Return in about 4 months (around 11/09/2021).    Lindell Spar, MD

## 2021-07-09 NOTE — Assessment & Plan Note (Signed)
Dizziness likely due to vertigo, could have worsened with hypoglycemia at times Meclizine PRN Small, frequent meals Avoid sudden positional changes

## 2021-07-09 NOTE — Patient Instructions (Signed)
Please start taking Pravastatin 80 mg instead of 40 mg.  Please start taking Trazodone 50 mg instead of 25 mg for insomnia.  Please stop taking Glimepiride for now. Continue to follow low carb diet and eat at regular intervals.  Continue to take other medications as prescribed.

## 2021-07-09 NOTE — Assessment & Plan Note (Signed)
Lipid profile reviewed Increased Pravastatin dose to 80 mg QD Would prefer to add Vascepa, but cost is a limiting factor currently - referred to CCM for medication assistance

## 2021-07-11 ENCOUNTER — Telehealth: Payer: Self-pay | Admitting: *Deleted

## 2021-07-11 LAB — MICROALBUMIN, URINE: Microalbumin, Urine: 6.3 ug/mL

## 2021-07-11 NOTE — Chronic Care Management (AMB) (Signed)
  Chronic Care Management   Outreach Note  07/11/2021 Name: Randall Sullivan MRN: [id number] DOB: [birth year]  Randall Sullivan is a 70 y.o. year old male who is a primary care patient of Noreene Larsson, NP. I reached out to Joellyn Haff by phone today in response to a referral sent by [name] primary care provider.  An unsuccessful telephone outreach was attempted today. The patient was referred to the case management team for assistance with care management and care coordination.   Follow Up Plan: A HIPAA compliant phone message was left for the patient providing contact information and requesting a return call. The care management team will reach out to the patient again over the next 7 days. If patient returns call to provider office, please advise to call [facility] at [phone].  [facility]  Direct Dial: [phone]

## 2021-07-14 ENCOUNTER — Other Ambulatory Visit: Payer: Self-pay

## 2021-07-14 ENCOUNTER — Emergency Department (HOSPITAL_COMMUNITY): Payer: Medicare Other

## 2021-07-14 ENCOUNTER — Encounter (HOSPITAL_COMMUNITY): Payer: Self-pay | Admitting: *Deleted

## 2021-07-14 ENCOUNTER — Inpatient Hospital Stay (HOSPITAL_COMMUNITY)
Admission: EM | Admit: 2021-07-14 | Discharge: 2021-07-16 | DRG: 339 | Disposition: A | Discharge status: Home Health | Payer: Medicare Other | Attending: Family Medicine | Admitting: Family Medicine

## 2021-07-14 DIAGNOSIS — E1169 Type 2 diabetes mellitus with other specified complication: Secondary | ICD-10-CM | POA: Diagnosis not present

## 2021-07-14 DIAGNOSIS — Z87891 Personal history of nicotine dependence: Secondary | ICD-10-CM

## 2021-07-14 DIAGNOSIS — Z7901 Long term (current) use of anticoagulants: Secondary | ICD-10-CM | POA: Diagnosis not present

## 2021-07-14 DIAGNOSIS — M1990 Unspecified osteoarthritis, unspecified site: Secondary | ICD-10-CM | POA: Diagnosis present

## 2021-07-14 DIAGNOSIS — I69311 Memory deficit following cerebral infarction: Secondary | ICD-10-CM

## 2021-07-14 DIAGNOSIS — J449 Chronic obstructive pulmonary disease, unspecified: Secondary | ICD-10-CM | POA: Diagnosis present

## 2021-07-14 DIAGNOSIS — Z85118 Personal history of other malignant neoplasm of bronchus and lung: Secondary | ICD-10-CM | POA: Diagnosis not present

## 2021-07-14 DIAGNOSIS — E119 Type 2 diabetes mellitus without complications: Secondary | ICD-10-CM

## 2021-07-14 DIAGNOSIS — N400 Enlarged prostate without lower urinary tract symptoms: Secondary | ICD-10-CM | POA: Diagnosis present

## 2021-07-14 DIAGNOSIS — K3580 Unspecified acute appendicitis: Secondary | ICD-10-CM | POA: Diagnosis not present

## 2021-07-14 DIAGNOSIS — I714 Abdominal aortic aneurysm, without rupture, unspecified: Secondary | ICD-10-CM

## 2021-07-14 DIAGNOSIS — K3533 Acute appendicitis with perforation and localized peritonitis, with abscess: Secondary | ICD-10-CM | POA: Diagnosis present

## 2021-07-14 DIAGNOSIS — Z902 Acquired absence of lung [part of]: Secondary | ICD-10-CM | POA: Diagnosis not present

## 2021-07-14 DIAGNOSIS — Z20822 Contact with and (suspected) exposure to covid-19: Secondary | ICD-10-CM | POA: Diagnosis present

## 2021-07-14 DIAGNOSIS — I10 Essential (primary) hypertension: Secondary | ICD-10-CM | POA: Diagnosis present

## 2021-07-14 DIAGNOSIS — K353 Acute appendicitis with localized peritonitis, without perforation or gangrene: Secondary | ICD-10-CM | POA: Diagnosis not present

## 2021-07-14 DIAGNOSIS — I7143 Infrarenal abdominal aortic aneurysm, without rupture: Secondary | ICD-10-CM

## 2021-07-14 DIAGNOSIS — J439 Emphysema, unspecified: Secondary | ICD-10-CM | POA: Diagnosis present

## 2021-07-14 DIAGNOSIS — Z79899 Other long term (current) drug therapy: Secondary | ICD-10-CM

## 2021-07-14 DIAGNOSIS — R1031 Right lower quadrant pain: Secondary | ICD-10-CM | POA: Diagnosis not present

## 2021-07-14 DIAGNOSIS — I69351 Hemiplegia and hemiparesis following cerebral infarction affecting right dominant side: Secondary | ICD-10-CM | POA: Diagnosis not present

## 2021-07-14 DIAGNOSIS — Z825 Family history of asthma and other chronic lower respiratory diseases: Secondary | ICD-10-CM | POA: Diagnosis not present

## 2021-07-14 DIAGNOSIS — I480 Paroxysmal atrial fibrillation: Secondary | ICD-10-CM | POA: Diagnosis present

## 2021-07-14 DIAGNOSIS — I2510 Atherosclerotic heart disease of native coronary artery without angina pectoris: Secondary | ICD-10-CM | POA: Diagnosis not present

## 2021-07-14 DIAGNOSIS — C3490 Malignant neoplasm of unspecified part of unspecified bronchus or lung: Secondary | ICD-10-CM | POA: Diagnosis present

## 2021-07-14 DIAGNOSIS — C3491 Malignant neoplasm of unspecified part of right bronchus or lung: Secondary | ICD-10-CM | POA: Diagnosis not present

## 2021-07-14 HISTORY — DX: Unspecified acute appendicitis: K35.80

## 2021-07-14 LAB — SURGICAL PCR SCREEN
MRSA, PCR: NEGATIVE
Staphylococcus aureus: NEGATIVE

## 2021-07-14 LAB — CBC
HCT: 52.8 % — ABNORMAL HIGH (ref 39.0–52.0)
Hemoglobin: 16.7 g/dL (ref 13.0–17.0)
MCH: 27.1 pg (ref 26.0–34.0)
MCHC: 31.6 g/dL (ref 30.0–36.0)
MCV: 85.6 fL (ref 80.0–100.0)
Platelets: 239 10*3/uL (ref 150–400)
RBC: 6.17 MIL/uL — ABNORMAL HIGH (ref 4.22–5.81)
RDW: 14.5 % (ref 11.5–15.5)
WBC: 14.5 10*3/uL — ABNORMAL HIGH (ref 4.0–10.5)
nRBC: 0 % (ref 0.0–0.2)

## 2021-07-14 LAB — COMPREHENSIVE METABOLIC PANEL
ALT: 28 U/L (ref 0–44)
AST: 29 U/L (ref 15–41)
Albumin: 4.7 g/dL (ref 3.5–5.0)
Alkaline Phosphatase: 41 U/L (ref 38–126)
Anion gap: 8 (ref 5–15)
BUN: 23 mg/dL (ref 8–23)
CO2: 29 mmol/L (ref 22–32)
Calcium: 9.2 mg/dL (ref 8.9–10.3)
Chloride: 98 mmol/L (ref 98–111)
Creatinine, Ser: 1.33 mg/dL — ABNORMAL HIGH (ref 0.61–1.24)
GFR, Estimated: 57 mL/min — ABNORMAL LOW (ref >60)
Glucose, Bld: 108 mg/dL — ABNORMAL HIGH (ref 70–99)
Potassium: 5.4 mmol/L — ABNORMAL HIGH (ref 3.5–5.1)
Sodium: 135 mmol/L (ref 135–145)
Total Bilirubin: 0.5 mg/dL (ref 0.3–1.2)
Total Protein: 8 g/dL (ref 6.5–8.1)

## 2021-07-14 LAB — LIPASE, BLOOD: Lipase: 44 U/L (ref 11–51)

## 2021-07-14 LAB — RESP PANEL BY RT-PCR (FLU A&B, COVID) ARPGX2
Influenza A by PCR: NEGATIVE
Influenza B by PCR: NEGATIVE
SARS Coronavirus 2 by RT PCR: NEGATIVE

## 2021-07-14 LAB — PROTIME-INR
INR: 2.3 — ABNORMAL HIGH (ref 0.8–1.2)
Prothrombin Time: 25.3 seconds — ABNORMAL HIGH (ref 11.4–15.2)

## 2021-07-14 MED ORDER — ACETAMINOPHEN 650 MG RE SUPP: 650.0000 mg | Freq: Four times a day (QID) | RECTAL | Status: DC | PRN

## 2021-07-14 MED ORDER — TRAZODONE HCL 50 MG PO TABS
50.0000 mg | ORAL_TABLET | Freq: Every day | ORAL | Status: DC
  Administered 2021-07-14 – 2021-07-15 (×2): 50 mg via ORAL
  Filled 2021-07-14 (×2): qty 1

## 2021-07-14 MED ORDER — TAMSULOSIN HCL 0.4 MG PO CAPS
0.4000 mg | ORAL_CAPSULE | Freq: Every day | ORAL | Status: DC
  Administered 2021-07-15 – 2021-07-16 (×2): 0.4 mg via ORAL
  Filled 2021-07-14 (×2): qty 1

## 2021-07-14 MED ORDER — SODIUM CHLORIDE 0.9 % IV BOLUS
500.0000 mL | Freq: Once | INTRAVENOUS | Status: AC
  Administered 2021-07-14: 500 mL via INTRAVENOUS

## 2021-07-14 MED ORDER — HYDROMORPHONE HCL 1 MG/ML IJ SOLN
0.5000 mg | Freq: Once | INTRAMUSCULAR | Status: AC
  Administered 2021-07-14: 0.5 mg via INTRAVENOUS
  Filled 2021-07-14: qty 1

## 2021-07-14 MED ORDER — POLYETHYLENE GLYCOL 3350 17 G PO PACK: 17.0000 g | PACK | Freq: Every day | ORAL | Status: DC | PRN

## 2021-07-14 MED ORDER — METOPROLOL SUCCINATE ER 50 MG PO TB24
50.0000 mg | ORAL_TABLET | Freq: Every day | ORAL | Status: DC
  Administered 2021-07-15 – 2021-07-16 (×2): 50 mg via ORAL
  Filled 2021-07-14 (×2): qty 1

## 2021-07-14 MED ORDER — ALBUTEROL SULFATE (2.5 MG/3ML) 0.083% IN NEBU
2.5000 mg | INHALATION_SOLUTION | Freq: Four times a day (QID) | RESPIRATORY_TRACT | Status: DC | PRN
  Administered 2021-07-15 – 2021-07-16 (×3): 2.5 mg via RESPIRATORY_TRACT
  Filled 2021-07-14 (×4): qty 3

## 2021-07-14 MED ORDER — PRAVASTATIN SODIUM 40 MG PO TABS
80.0000 mg | ORAL_TABLET | Freq: Every day | ORAL | Status: DC
  Administered 2021-07-15: 80 mg via ORAL
  Filled 2021-07-14: qty 2

## 2021-07-14 MED ORDER — ONDANSETRON HCL 4 MG/2ML IJ SOLN: 4.0000 mg | Freq: Four times a day (QID) | INTRAMUSCULAR | Status: DC | PRN

## 2021-07-14 MED ORDER — DILTIAZEM HCL ER COATED BEADS 120 MG PO CP24
120.0000 mg | ORAL_CAPSULE | Freq: Every day | ORAL | Status: DC
  Administered 2021-07-15 – 2021-07-16 (×2): 120 mg via ORAL
  Filled 2021-07-14 (×2): qty 1

## 2021-07-14 MED ORDER — PHYTONADIONE 5 MG PO TABS
2.5000 mg | ORAL_TABLET | Freq: Once | ORAL | Status: AC
  Administered 2021-07-14: 2.5 mg via ORAL
  Filled 2021-07-14: qty 1

## 2021-07-14 MED ORDER — ACETAMINOPHEN 325 MG PO TABS: 650.0000 mg | ORAL_TABLET | Freq: Four times a day (QID) | ORAL | Status: DC | PRN

## 2021-07-14 MED ORDER — PIPERACILLIN-TAZOBACTAM 3.375 G IVPB
3.3750 g | Freq: Three times a day (TID) | INTRAVENOUS | Status: DC
  Administered 2021-07-15 (×2): 3.375 g via INTRAVENOUS
  Filled 2021-07-14 (×2): qty 50

## 2021-07-14 MED ORDER — HYDROMORPHONE HCL 1 MG/ML IJ SOLN
0.5000 mg | INTRAMUSCULAR | Status: DC | PRN
Start: 2021-07-14 — End: 2021-07-15

## 2021-07-14 MED ORDER — ALBUTEROL SULFATE (2.5 MG/3ML) 0.083% IN NEBU
2.5000 mg | INHALATION_SOLUTION | Freq: Once | RESPIRATORY_TRACT | Status: AC
  Administered 2021-07-15: 2.5 mg via RESPIRATORY_TRACT
  Filled 2021-07-14: qty 3

## 2021-07-14 MED ORDER — IOHEXOL 300 MG/ML  SOLN
100.0000 mL | Freq: Once | INTRAMUSCULAR | Status: AC | PRN
  Administered 2021-07-14: 100 mL via INTRAVENOUS

## 2021-07-14 MED ORDER — SODIUM CHLORIDE 0.9 % IV SOLN: INTRAVENOUS | Status: AC

## 2021-07-14 MED ORDER — ALBUTEROL SULFATE (2.5 MG/3ML) 0.083% IN NEBU
INHALATION_SOLUTION | RESPIRATORY_TRACT | Status: AC
  Administered 2021-07-14: 5 mg
  Filled 2021-07-14: qty 6

## 2021-07-14 MED ORDER — ONDANSETRON HCL 4 MG PO TABS: 4.0000 mg | ORAL_TABLET | Freq: Four times a day (QID) | ORAL | Status: DC | PRN

## 2021-07-14 MED ORDER — PIPERACILLIN-TAZOBACTAM 3.375 G IVPB 30 MIN
3.3750 g | Freq: Once | INTRAVENOUS | Status: AC
  Administered 2021-07-14: 3.375 g via INTRAVENOUS
  Filled 2021-07-14: qty 50

## 2021-07-14 NOTE — ED Triage Notes (Signed)
Abdominal pain onset last night with diarrhea

## 2021-07-14 NOTE — ED Provider Notes (Signed)
[facility] EMERGENCY DEPARTMENT Provider Note   CSN: [id number] Arrival date & time: 07/14/21  1033     History Chief Complaint  Patient presents with   Abdominal Pain    Randall Sullivan is a 70 y.o. male.  HPI 70 year old male presents with abdominal pain. He's had pain in his RUQ for the past couple months but got a lot worse yesterday and today.  It is happening more often and seems to come and go.  He has a hard time describing the pain though he does notice about an 8 out of 10.  Nothing specific makes it come and go.  Last night he was dry heaving but could not vomit.  He had about 1 or 2 loose stools but no blood.  No fevers, back pain, shortness of breath or cough.  No urinary symptoms.  Past Medical History:  Diagnosis Date   A-fib ([place]) 2017   after last lung surgery patient had a history if Afib documented in hospital    Allergy    Arthritis    back & legs ( knees)   Cancer ([place])    lung   Complication of anesthesia    agitated upon waking from anesth.    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   Right sided weakness 03/12/2021   Hypokalemia 03/12/2021   Hyperglycemia 03/12/2021   CVA (cerebral vascular accident) ([place]) 03/12/2021   Aortic atherosclerosis ([place]) 08/30/2020   Elevated coronary artery calcium score 08/30/2020   Hyperlipidemia 11/17/2019   TSH (thyroid-stimulating hormone deficiency) 11/17/2019   S/P Wedge Resection RUL, Blebectomy RUL 12/09/2018   Primary lung adenocarcinoma  ([place]) 10/15/2016   PAF (paroxysmal atrial fibrillation) ([place]) 09/16/2016   Encounter for therapeutic drug monitoring 09/16/2016   S/P lobectomy of lung 08/05/2016   Cancer of left lung ([place]) 08/01/2016   Lung nodule 06/11/2016   Former smoker 06/11/2016   Emphysema of lung ([place]) 06/11/2016   Essential (primary) hypertension 10/06/2014    Past Surgical History:  Procedure Laterality Date   HERNIA REPAIR Bilateral 7619   umbilical   VIDEO ASSISTED THORACOSCOPY Right 12/21/2018   Procedure: VIDEO ASSISTED THORACOSCOPY AND RESECTION OF BLEBS RIGHT LOWER LOBE;  Surgeon: [name], MD;  Location: [facility] OR;  Service: Thoracic;  Laterality: Right;   VIDEO ASSISTED THORACOSCOPY (VATS)/ LOBECTOMY Left 08/01/2016   Procedure: VIDEO ASSISTED THORACOSCOPY (VATS)/ LEFT UPPER LOBECTOMY with lymph node sampling and onQ placement;  Surgeon: [name], MD;  Location: [place];  Service: Thoracic;  Laterality: Left;   VIDEO ASSISTED THORACOSCOPY (VATS)/WEDGE RESECTION Right 12/09/2018   Procedure: VIDEO ASSISTED THORACOSCOPY (VATS)/WEDGE RESECTION;  Surgeon: [name], MD;  Location: [place];  Service: Thoracic;  Laterality: Right;   VIDEO BRONCHOSCOPY Bilateral 12/21/2018   Procedure: VIDEO BRONCHOSCOPY;  Surgeon: [name], MD;  Location: [facility];  Service: Thoracic;  Laterality: Bilateral;   VIDEO BRONCHOSCOPY WITH ENDOBRONCHIAL NAVIGATION N/A 07/03/2016  Procedure: VIDEO BRONCHOSCOPY WITH ENDOBRONCHIAL NAVIGATION;  Surgeon: [name], MD;  Location: [place];  Service: Thoracic;  Laterality: N/A;   VIDEO BRONCHOSCOPY WITH INSERTION OF INTERBRONCHIAL VALVE (IBV) N/A 12/11/2018   Procedure: VIDEO BRONCHOSCOPY WITH INSERTION OF INTERBRONCHIAL VALVE (IBV);  Surgeon: [name], MD;  Location: [facility] OR;  Service: Thoracic;  Laterality: N/A;   VIDEO BRONCHOSCOPY WITH INSERTION OF INTERBRONCHIAL VALVE (IBV) N/A 02/10/2019   Procedure: VIDEO BRONCHOSCOPY  WITH REMOVAL OF INTERBRONCHIAL VALVE (IBV);  Surgeon: [name], MD;  Location: [facility] OR;  Service: Thoracic;  Laterality: N/A;       Family History  Problem Relation Age of Onset   Cancer Mother    Cancer Sister    Aneurysm Sister    Kidney disease Paternal Aunt    Asthma Brother    Cancer - Lung Brother    Cancer Brother     Social History   Tobacco Use   Smoking status: Former    Packs/day: 1.00    Years: 40.00    Pack years: 40.00    Types: Cigarettes    Quit date: 05/28/2016    Years since quitting: 5.1   Smokeless tobacco: Never  Vaping Use   Vaping Use: Never used  Substance Use Topics   Alcohol use: Not Currently    Comment: quit- 2016   Drug use: No    Home Medications Prior to Admission medications   Medication Sig Start Date End Date Taking? Authorizing Provider  acetaminophen (TYLENOL) 325 MG tablet Take 2 tablets (650 mg total) by mouth every 6 (six) hours as needed for mild pain (or Fever >/= 101). 03/22/21   Angiulli, Lavon Paganini, PA-C  albuterol (PROVENTIL) (2.5 MG/3ML) 0.083% nebulizer solution Take 3 mLs (2.5 mg total) by nebulization every 6 (six) hours as needed for wheezing or shortness of breath. 03/07/21   Doree Albee, MD  Cholecalciferol (VITAMIN D3) 125 MCG (5000 UT) CAPS Take 1 capsule (5,000 Units total) by mouth daily. 03/22/21   Angiulli, Lavon Paganini, PA-C  diltiazem (CARDIZEM CD) 120 MG 24 hr capsule Take 1 capsule (120 mg total) by mouth daily. 03/22/21   Angiulli, Lavon Paganini, PA-C  fenofibrate (TRICOR) 145 MG tablet Take 1 tablet (145 mg total) by mouth daily. 07/02/21   Noreene Larsson, NP  loratadine (CLARITIN) 10 MG tablet Take 1 tablet (10 mg total) by mouth daily. 07/09/21   Lindell Spar, MD  meclizine (ANTIVERT) 25 MG tablet Take 1 tablet (25 mg total) by mouth 3 (three) times daily as needed for dizziness. 07/09/21   Lindell Spar, MD  metoprolol succinate (TOPROL-XL) 50 MG 24 hr tablet Take 1 tablet (50 mg total) by mouth daily.  Take with or immediately following a meal. 03/22/21   Angiulli, Lavon Paganini, PA-C  OXYGEN Inhale 2-3 L into the lungs continuous. Hx of lung ca, copd, and emphysema    [provider]  polyethylene glycol (MIRALAX / GLYCOLAX) 17 g packet Take 17 g by mouth daily as needed for mild constipation. 03/22/21   Angiulli, Lavon Paganini, PA-C  pravastatin (PRAVACHOL) 80 MG tablet Take 1 tablet (80 mg total) by mouth daily. 07/09/21 01/05/22  Lindell Spar, MD  tamsulosin (FLOMAX) 0.4 MG CAPS capsule Take 1 capsule (0.4 mg total) by mouth daily. 07/09/21   Lindell Spar, MD  traZODone (DESYREL) 50 MG tablet Take 0.5 tablets (25 mg total) by mouth at bedtime. 05/16/21   Ailene Ards, NP  warfarin (COUMADIN) 5 MG tablet Take 1 tablet (5 mg total) by mouth daily. 07/02/21 07/02/22  Noreene Larsson, NP    Allergies    No known allergies  Review of Systems   Review of Systems  Constitutional:  Negative for fever.  Respiratory:  Negative for shortness of breath.   Gastrointestinal:  Positive for abdominal pain, diarrhea and nausea.  Genitourinary:  Negative for dysuria.  Musculoskeletal:  Negative for back pain.   Physical Exam Updated Vital Signs BP (!) 141/83 (BP Location: Right Arm)   Pulse 74   Temp 98.6 F (37 C) (Oral)   Resp 16   SpO2 99%   Physical Exam Vitals and nursing note reviewed.  Constitutional:      General: He is not in acute distress.    Appearance: He is well-developed. He is not ill-appearing or diaphoretic.  HENT:     Head: Normocephalic and atraumatic.     Right Ear: External ear normal.     Left Ear: External ear normal.     Nose: Nose normal.  Eyes:     General:        Right eye: No discharge.        Left eye: No discharge.  Cardiovascular:     Rate and Rhythm: Normal rate and regular rhythm.     Heart sounds: Normal heart sounds.  Pulmonary:     Effort: Pulmonary effort is normal.     Breath sounds: Normal breath sounds.  Abdominal:     Palpations: Abdomen  is soft.     Tenderness: There is abdominal tenderness in the right lower quadrant. There is no right CVA tenderness or left CVA tenderness.  Musculoskeletal:     Cervical back: Neck supple.  Skin:    General: Skin is warm and dry.  Neurological:     Mental Status: He is alert.  Psychiatric:        Mood and Affect: Mood is not anxious.    ED Results / Procedures / Treatments   Labs (all labs ordered are listed, but only abnormal results are displayed) Labs Reviewed  COMPREHENSIVE METABOLIC PANEL - Abnormal; Notable for the following components:      Result Value   Potassium 5.4 (*)    Glucose, Bld 108 (*)    Creatinine, Ser 1.33 (*)    GFR, Estimated 57 (*)    All other components within normal limits  CBC - Abnormal; Notable for the following components:   WBC 14.5 (*)    RBC 6.17 (*)    HCT 52.8 (*)    All other components within normal limits  PROTIME-INR - Abnormal; Notable for the following components:   Prothrombin Time 25.3 (*)    INR 2.3 (*)    All other components within normal limits  RESP PANEL BY RT-PCR (FLU A&B, COVID) ARPGX2  LIPASE, BLOOD  URINALYSIS, ROUTINE W REFLEX MICROSCOPIC    EKG None  Radiology CT ABDOMEN PELVIS W CONTRAST  Result Date: 07/14/2021 CLINICAL DATA:  Right lower quadrant abdominal pain, appendicitis suspected EXAM: CT ABDOMEN AND PELVIS WITH CONTRAST TECHNIQUE: Multidetector CT imaging of the abdomen and pelvis was performed using the standard protocol following bolus administration of intravenous contrast. CONTRAST:  162mL OMNIPAQUE IOHEXOL 300 MG/ML  SOLN COMPARISON:  None. FINDINGS: Lower chest: No acute abnormality.  Emphysema. Hepatobiliary: No solid liver abnormality is seen. No gallstones, gallbladder wall thickening, or biliary dilatation. Pancreas: Unremarkable. No pancreatic ductal dilatation or surrounding inflammatory changes. Spleen: Normal  in size without significant abnormality. Adrenals/Urinary Tract: Adrenal glands are  unremarkable. Kidneys are normal, without renal calculi, solid lesion, or hydronephrosis. Bladder is unremarkable. Stomach/Bowel: Stomach is within normal limits. Thickened, fluid-filled appendix measuring up to 1.0 cm in caliber with adjacent fat stranding, containing a small appendicolith near the ostium (series 5, image 61, series 2, image 62). No evidence of bowel wall thickening, distention, or inflammatory changes. Sigmoid diverticula. Vascular/Lymphatic: Aortic atherosclerosis. 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 No enlarged abdominal or pelvic lymph nodes. Reproductive: No mass or other significant abnormality. Other: Small, fat containing bilateral inguinal hernias. No abdominopelvic ascites. Musculoskeletal: No acute or significant osseous findings. IMPRESSION: 1. Thickened, fluid-filled appendix measuring up to 1.0 cm in caliber with adjacent fat stranding, containing a small appendicolith near the ostium. Findings are consistent with acute appendicitis. No evidence of perforation or abscess. 2. 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 Recommend follow-up every 12 months and vascular consultation. This recommendation follows ACR consensus guidelines: White Paper of the ACR Incidental Findings Committee II on Vascular Findings. J Am Coll Radiol 2013; 10:789-794. Aortic Atherosclerosis (ICD10-I70.0). Electronically Signed   By: Delanna Ahmadi M.D.   On: 07/14/2021 16:55    Procedures Procedures   Medications Ordered in ED Medications  piperacillin-tazobactam (ZOSYN) IVPB 3.375 g (has no administration in time range)  phytonadione (VITAMIN K) tablet 2.5 mg (has no administration in time range)  sodium chloride 0.9 % bolus 500 mL (500 mLs Intravenous New Bag/Given 07/14/21 1628)  HYDROmorphone (DILAUDID) injection 0.5 mg (0.5 mg Intravenous Given  07/14/21 1628)  iohexol (OMNIPAQUE) 300 MG/ML solution 100 mL (100 mLs Intravenous Contrast Given 07/14/21 1635)    ED Course  I have reviewed the triage vital signs and the nursing notes.  Pertinent labs & imaging results that were available during my care of the patient were reviewed by me and considered in my medical decision making (see chart for details).    MDM Rules/Calculators/A&P                           CT scan has been personally reviewed and does show acute appendicitis.  There is also a nonruptured AAA.  I discussed with Dr. Constance Haw who asked for vascular phone consult to ensure he does not need anticoagulation or other emergent treatment.  Discussed with Dr. Carlis Abbott who indicates that this is not concerning and he can follow-up with vascular as an outpatient.  No indication to anticoagulate specifically for this.  Dr. Constance Haw would like to give some vitamin K and hold Coumadin tonight and likely do surgery in the morning.  Given his comorbidities is requesting hospitalist consult.  Dr. Denton Brick by will admit. Given IV zosyn. Final Clinical Impression(s) / ED Diagnoses Final diagnoses:  Acute appendicitis, unspecified acute appendicitis type  Infrarenal abdominal aortic aneurysm (AAA) without rupture    Rx / DC Orders ED Discharge Orders     None        Sherwood Gambler, MD 07/14/21 1745

## 2021-07-14 NOTE — Progress Notes (Signed)
Pharmacy Antibiotic Note  Randall Sullivan is a 70 y.o. male admitted on 07/14/2021 with abdominal pain for acute appendicitis.  Pharmacy has been consulted for Zosyn dosing.  Plan Zosyn 3.375g IV q8h (4 hour infusion). Will continue to follow      Temp (24hrs), Avg:98.4 F (36.9 C), Min:98.2 F (36.8 C), Max:98.6 F (37 C)  Recent Labs  Lab 07/14/21 1241  WBC 14.5*  CREATININE 1.33*    Estimated Creatinine Clearance: 59.8 mL/min (A) (by C-G formula based on SCr of 1.33 mg/dL (H)).    Allergies  Allergen Reactions   No Known Allergies       Thank you for allowing pharmacy to be a part of this patient's care.  Vernie Ammons 07/14/2021 7:36 PM

## 2021-07-14 NOTE — Progress Notes (Signed)
[facility]  Acute appendicitis, INR 2.3 on coumadin for A fib. Hold coumadin, reverse INR. INR in AM.  Iv antibiotics for now.   Plan for Lap appy around 10 AM. NPO midnight. Will discuss with patient in AM.  Curlene Labrum, MD [facility] [address] [place], [place] [address] [phone] (office)

## 2021-07-14 NOTE — H&P (Signed)
History and Physical    Randall Sullivan MWN:[id number] DOB: 30-May-1951 DOA: 07/14/2021  PCP: Noreene Larsson, NP   Patient coming from: Home  I have personally briefly reviewed patient's old medical records in [place]  Chief Complaint: Abdominal Pain,   HPI: Randall Sullivan is a 70 y.o. male with medical history significant for recent CVA, diabetes mellitus, hypertension, paroxysmal atrial fibrillation, lung cancer. Patient presented to the ED with complaints of abdominal pain of 2 months duration-this abdominal pain was on the left right upper and lower abdomen, nonspecific. But yesterday and today pain got worse and was particularly located in the right lower quadrant.  Reports some episodes of dry heaving without actual vomiting.  However no true loose stools.  ED Course: Stable vitals.  Temperature 98.3.  WBC 14.5.  Potassium 5.4.  INR 2.3.  Abdominal CT W contrast suggesting acute appendicitis without perforation or abscess.  Also showed an infrarenal AAA. EDP talked to Dr. Constance Haw, will be seen in the morning, Antibiotics, reverse coumadin. Patient was started on Zosyn.  500 mill bolus given.  2.5 mg oral vitamin K given.  Review of Systems: As per HPI all other systems reviewed and negative.  Past Medical History:  Diagnosis Date   A-fib ([place]) 2017   after last lung surgery patient had a history if Afib documented in hospital    Allergy    Arthritis    back & legs ( knees)   Cancer ([place])    lung   Complication of anesthesia    agitated upon waking from anesth.    Emphysema    History of lung cancer    Hyperlipidemia    Hypertension    Oxygen dependent    2L continuous   Stroke ([place])    Tobacco abuse     Past Surgical History:  Procedure Laterality Date   HERNIA REPAIR Bilateral 4034   umbilical   VIDEO ASSISTED THORACOSCOPY Right 12/21/2018   Procedure: VIDEO ASSISTED THORACOSCOPY AND RESECTION OF BLEBS RIGHT LOWER LOBE;  Surgeon: [name], MD;   Location: [facility];  Service: Thoracic;  Laterality: Right;   VIDEO ASSISTED THORACOSCOPY (VATS)/ LOBECTOMY Left 08/01/2016   Procedure: VIDEO ASSISTED THORACOSCOPY (VATS)/ LEFT UPPER LOBECTOMY with lymph node sampling and onQ placement;  Surgeon: [name], MD;  Location: [facility];  Service: Thoracic;  Laterality: Left;   VIDEO ASSISTED THORACOSCOPY (VATS)/WEDGE RESECTION Right 12/09/2018   Procedure: VIDEO ASSISTED THORACOSCOPY (VATS)/WEDGE RESECTION;  Surgeon: [name], MD;  Location: [facility];  Service: Thoracic;  Laterality: Right;   VIDEO BRONCHOSCOPY Bilateral 12/21/2018   Procedure: VIDEO BRONCHOSCOPY;  Surgeon: [name], MD;  Location: [facility];  Service: Thoracic;  Laterality: Bilateral;   VIDEO BRONCHOSCOPY WITH ENDOBRONCHIAL NAVIGATION N/A 07/03/2016   Procedure: VIDEO BRONCHOSCOPY WITH ENDOBRONCHIAL NAVIGATION;  Surgeon: [name], MD;  Location: [facility];  Service: Thoracic;  Laterality: N/A;   VIDEO BRONCHOSCOPY WITH INSERTION OF INTERBRONCHIAL VALVE (IBV) N/A 12/11/2018   Procedure: VIDEO BRONCHOSCOPY WITH INSERTION OF INTERBRONCHIAL VALVE (IBV);  Surgeon: [name], MD;  Location[facility];  Service: Thoracic;  Laterality: N/A;   VIDEO BRONCHOSCOPY WITH INSERTION OF INTERBRONCHIAL VALVE (IBV) N/A 02/10/2019   Procedure: VIDEO BRONCHOSCOPY WITH REMOVAL OF INTERBRONCHIAL VALVE (IBV);  Surgeon: [name], MD;  Location: [facility];  Service: Thoracic;  Laterality: N/A;     reports that he quit smoking about 5 years ago. His smoking use included cigarettes. He has a 40.00  pack-year smoking history. He has never used smokeless tobacco. He reports that he does not currently use alcohol. He reports that he does not use drugs.  Allergies  Allergen Reactions   No Known Allergies     Family History  Problem Relation Age of Onset   Cancer Mother    Cancer Sister    Aneurysm Sister    Kidney disease Paternal Aunt    Asthma Brother     Cancer - Lung Brother    Cancer Brother     Prior to Admission medications   Medication Sig Start Date End Date Taking? Authorizing Provider  acetaminophen (TYLENOL) 325 MG tablet Take 2 tablets (650 mg total) by mouth every 6 (six) hours as needed for mild pain (or Fever >/= 101). 03/22/21   Angiulli, Lavon Paganini, PA-C  albuterol (PROVENTIL) (2.5 MG/3ML) 0.083% nebulizer solution Take 3 mLs (2.5 mg total) by nebulization every 6 (six) hours as needed for wheezing or shortness of breath. 03/07/21   Doree Albee, MD  Cholecalciferol (VITAMIN D3) 125 MCG (5000 UT) CAPS Take 1 capsule (5,000 Units total) by mouth daily. 03/22/21   Angiulli, Lavon Paganini, PA-C  diltiazem (CARDIZEM CD) 120 MG 24 hr capsule Take 1 capsule (120 mg total) by mouth daily. 03/22/21   Angiulli, Lavon Paganini, PA-C  fenofibrate (TRICOR) 145 MG tablet Take 1 tablet (145 mg total) by mouth daily. 07/02/21   Noreene Larsson, NP  loratadine (CLARITIN) 10 MG tablet Take 1 tablet (10 mg total) by mouth daily. 07/09/21   Lindell Spar, MD  meclizine (ANTIVERT) 25 MG tablet Take 1 tablet (25 mg total) by mouth 3 (three) times daily as needed for dizziness. 07/09/21   Lindell Spar, MD  metoprolol succinate (TOPROL-XL) 50 MG 24 hr tablet Take 1 tablet (50 mg total) by mouth daily. Take with or immediately following a meal. 03/22/21   Angiulli, Lavon Paganini, PA-C  OXYGEN Inhale 2-3 L into the lungs continuous. Hx of lung ca, copd, and emphysema    [provider]  polyethylene glycol (MIRALAX / GLYCOLAX) 17 g packet Take 17 g by mouth daily as needed for mild constipation. 03/22/21   Angiulli, Lavon Paganini, PA-C  pravastatin (PRAVACHOL) 80 MG tablet Take 1 tablet (80 mg total) by mouth daily. 07/09/21 01/05/22  Lindell Spar, MD  tamsulosin (FLOMAX) 0.4 MG CAPS capsule Take 1 capsule (0.4 mg total) by mouth daily. 07/09/21   Lindell Spar, MD  traZODone (DESYREL) 50 MG tablet Take 0.5 tablets (25 mg total) by mouth at bedtime. 05/16/21   Ailene Ards, NP  warfarin (COUMADIN) 5 MG tablet Take 1 tablet (5 mg total) by mouth daily. 07/02/21 07/02/22  Noreene Larsson, NP    Physical Exam: Vitals:   07/14/21 1128 07/14/21 1431 07/14/21 1735  BP: 107/72 120/74 (!) 141/83  Pulse: 82 75 74  Resp: 18 20 16   Temp: 98.2 F (36.8 C) 98.6 F (37 C)   TempSrc: Oral Oral   SpO2: 95% 98% 99%    Constitutional: NAD, calm, comfortable Vitals:   07/14/21 1128 07/14/21 1431 07/14/21 1735  BP: 107/72 120/74 (!) 141/83  Pulse: 82 75 74  Resp: 18 20 16   Temp: 98.2 F (36.8 C) 98.6 F (37 C)   TempSrc: Oral Oral   SpO2: 95% 98% 99%   Eyes: PERRL, lids and conjunctivae normal ENMT: Mucous membranes are moist.  Neck: normal, supple, no masses, no thyromegaly Respiratory: clear to auscultation bilaterally,  no wheezing, no crackles,. Normal respiratory effort. No accessory muscle use.  Cardiovascular: Regular rate and rhythm, no murmurs / rubs / gallops. No extremity edema. 2+ pedal pulses.  Abdomen: no tenderness, no masses palpated. No hepatosplenomegaly.  Musculoskeletal: no clubbing / cyanosis. No joint deformity upper and lower extremities. Good ROM, no contractures. Normal muscle tone.  Skin: no rashes, lesions, ulcers. No induration Neurologic: No apparent cranial nerve abnormality, 5/5 strength in all extremities. Psychiatric: Normal judgment and insight. Alert and oriented x 3. Normal mood.   Labs on Admission: I have personally reviewed following labs and imaging studies  CBC: Recent Labs  Lab 07/14/21 1241  WBC 14.5*  HGB 16.7  HCT 52.8*  MCV 85.6  PLT 209   Basic Metabolic Panel: Recent Labs  Lab 07/14/21 1241  NA 135  K 5.4*  CL 98  CO2 29  GLUCOSE 108*  BUN 23  CREATININE 1.33*  CALCIUM 9.2   GFR: Estimated Creatinine Clearance: 59.8 mL/min (A) (by C-G formula based on SCr of 1.33 mg/dL (H)). Liver Function Tests: Recent Labs  Lab 07/14/21 1241  AST 29  ALT 28  ALKPHOS 41  BILITOT 0.5  PROT 8.0   ALBUMIN 4.7   Recent Labs  Lab 07/14/21 1241  LIPASE 44   Coagulation Profile: Recent Labs  Lab 07/14/21 1606  INR 2.3*    Radiological Exams on Admission: CT ABDOMEN PELVIS W CONTRAST  Result Date: 07/14/2021 CLINICAL DATA:  Right lower quadrant abdominal pain, appendicitis suspected EXAM: CT ABDOMEN AND PELVIS WITH CONTRAST TECHNIQUE: Multidetector CT imaging of the abdomen and pelvis was performed using the standard protocol following bolus administration of intravenous contrast. CONTRAST:  114mL OMNIPAQUE IOHEXOL 300 MG/ML  SOLN COMPARISON:  None. FINDINGS: Lower chest: No acute abnormality.  Emphysema. Hepatobiliary: No solid liver abnormality is seen. No gallstones, gallbladder wall thickening, or biliary dilatation. Pancreas: Unremarkable. No pancreatic ductal dilatation or surrounding inflammatory changes. Spleen: Normal in size without significant abnormality. Adrenals/Urinary Tract: Adrenal glands are unremarkable. Kidneys are normal, without renal calculi, solid lesion, or hydronephrosis. Bladder is unremarkable. Stomach/Bowel: Stomach is within normal limits. Thickened, fluid-filled appendix measuring up to 1.0 cm in caliber with adjacent fat stranding, containing a small appendicolith near the ostium (series 5, image 61, series 2, image 62). No evidence of bowel wall thickening, distention, or inflammatory changes. Sigmoid diverticula. Vascular/Lymphatic: Aortic atherosclerosis. 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 No enlarged abdominal or pelvic lymph nodes. Reproductive: No mass or other significant abnormality. Other: Small, fat containing bilateral inguinal hernias. No abdominopelvic ascites. Musculoskeletal: No acute or significant osseous findings. IMPRESSION: 1. Thickened, fluid-filled appendix measuring up to 1.0 cm in caliber with adjacent fat stranding, containing a small appendicolith near the  ostium. Findings are consistent with acute appendicitis. No evidence of perforation or abscess. 2. 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 Recommend follow-up every 12 months and vascular consultation. This recommendation follows ACR consensus guidelines: White Paper of the ACR Incidental Findings Committee II on Vascular Findings. J Am Coll Radiol 2013; 10:789-794. Aortic Atherosclerosis (ICD10-I70.0). Electronically Signed   By: Delanna Ahmadi M.D.   On: 07/14/2021 16:55    EKG: Independently reviewed.  Sinus rhythm rate 71.  QTc 451.  No significant change from prior.  Assessment/Plan Principal Problem:   Acute appendicitis Active Problems:   Essential (primary) hypertension   Emphysema  of lung ([place])   PAF (paroxysmal atrial fibrillation) ([facility])   Primary lung adenocarcinoma ([facility])   Diabetes mellitus ([place])   Abdominal aortic aneurysm (AAA)   Acute appendicitis-abdominal pain, leukocytosis of 14.5.  CT suggesting acute appendicitis without signs of perforation or abscess. - EDP talked to Dr. Constance Haw, will see in the morning, INR IV antibiotics for now. -IV Zosyn started in the ED, will continue -N.p.o. midnight -IV Dilaudid 0.5mg  as needed - 593mls bolus given, continue N/s 75cc/hr x 15hrs  Infrarenal Abdominal aortic aneurysm- Infrarenal abdominal aortic aneurysm measuring up to 4.0 x 3.8 cm with a large burden of eccentric mural thrombus. Recommend follow-up every 12 months and vascular consultation. - EDP talked to vascular surgeon- Dr. Carlis Abbott,  no anticoagulation needed, patient to follow-up with vascular as outpatient.  Paroxysmal atrial fibrillation-currently in sinus.  Rate controlled.  On rate limiting drugs and warfarin. INR 2.3. -Resume metoprolol, diltiazem -2.5 mg oral vit k given, -Daily INR -Hold warfarin  Non-small cell lung cancer- s/p lower lobectomy with lymph node dissection 2017, and wedge  resection of right upper lobe March 2020.  Currently under surveillance.  Follows with Dr. Earlie Server.  Controlled diabetes mellitus random 108.  Not on medication.  A1c 5.6. -Fasting CBGs daily  HTN-stable. -Resume diltiazem and Cardizem  Emphysema stable -Resume home bronchodilator regimen  CVA- diagnosed June 2022.  Patient has right-sided deficits not apparent on exam.  Ambulates with or without the cane.  Per daughter is main problem has been memory issues since the stroke. - Resume statins - Anticoag on hold for now  BPH -Resume tamsulosin  DVT prophylaxis: SCDs Code Status: Full code Family Communication: Daughter at bedside Disposition Plan: ~ 2 days Consults called: Gen Surg Admission status: Inpt, tele  I certify that at the point of admission it is my clinical judgment that the patient will require inpatient hospital care spanning beyond 2 midnights from the point of admission due to high intensity of service, high risk for further deterioration and high frequency of surveillance required.    Bethena Roys MD [facility]  07/14/2021, 7:24 PM

## 2021-07-14 NOTE — ED Notes (Signed)
Neb given for RT by nurse. RT had already pulled rx and neb

## 2021-07-15 ENCOUNTER — Inpatient Hospital Stay (HOSPITAL_COMMUNITY): Payer: Medicare Other | Admitting: Anesthesiology

## 2021-07-15 ENCOUNTER — Encounter (HOSPITAL_COMMUNITY)
Admission: EM | Disposition: A | Discharge status: Home Health | Payer: Self-pay | Source: Home / Self Care | Attending: Family Medicine

## 2021-07-15 DIAGNOSIS — J439 Emphysema, unspecified: Secondary | ICD-10-CM | POA: Diagnosis not present

## 2021-07-15 DIAGNOSIS — K3580 Unspecified acute appendicitis: Secondary | ICD-10-CM | POA: Diagnosis not present

## 2021-07-15 DIAGNOSIS — C3491 Malignant neoplasm of unspecified part of right bronchus or lung: Secondary | ICD-10-CM

## 2021-07-15 DIAGNOSIS — I7143 Infrarenal abdominal aortic aneurysm, without rupture: Secondary | ICD-10-CM | POA: Diagnosis not present

## 2021-07-15 DIAGNOSIS — E1169 Type 2 diabetes mellitus with other specified complication: Secondary | ICD-10-CM | POA: Diagnosis not present

## 2021-07-15 HISTORY — PX: LAPAROSCOPIC APPENDECTOMY: SHX408

## 2021-07-15 LAB — BASIC METABOLIC PANEL
Anion gap: 7 (ref 5–15)
BUN: 22 mg/dL (ref 8–23)
CO2: 29 mmol/L (ref 22–32)
Calcium: 8.7 mg/dL — ABNORMAL LOW (ref 8.9–10.3)
Chloride: 101 mmol/L (ref 98–111)
Creatinine, Ser: 1.3 mg/dL — ABNORMAL HIGH (ref 0.61–1.24)
GFR, Estimated: 59 mL/min — ABNORMAL LOW (ref >60)
Glucose, Bld: 102 mg/dL — ABNORMAL HIGH (ref 70–99)
Potassium: 4 mmol/L (ref 3.5–5.1)
Sodium: 137 mmol/L (ref 135–145)

## 2021-07-15 LAB — CBC
HCT: 47.3 % (ref 39.0–52.0)
Hemoglobin: 14.4 g/dL (ref 13.0–17.0)
MCH: 26.4 pg (ref 26.0–34.0)
MCHC: 30.4 g/dL (ref 30.0–36.0)
MCV: 86.8 fL (ref 80.0–100.0)
Platelets: 200 10*3/uL (ref 150–400)
RBC: 5.45 MIL/uL (ref 4.22–5.81)
RDW: 14.6 % (ref 11.5–15.5)
WBC: 8.2 10*3/uL (ref 4.0–10.5)
nRBC: 0 % (ref 0.0–0.2)

## 2021-07-15 LAB — PROTIME-INR
INR: 2.3 — ABNORMAL HIGH (ref 0.8–1.2)
INR: 1.7 — ABNORMAL HIGH (ref 0.8–1.2)
INR: 1.6 — ABNORMAL HIGH (ref 0.8–1.2)
Prothrombin Time: 24.9 seconds — ABNORMAL HIGH (ref 11.4–15.2)
Prothrombin Time: 20.3 seconds — ABNORMAL HIGH (ref 11.4–15.2)
Prothrombin Time: 18.6 seconds — ABNORMAL HIGH (ref 11.4–15.2)

## 2021-07-15 LAB — ABO/RH
ABO/RH(D): A POS
ABO/RH(D): A POS

## 2021-07-15 SURGERY — APPENDECTOMY, LAPAROSCOPIC
Anesthesia: General

## 2021-07-15 MED ORDER — ROCURONIUM BROMIDE 10 MG/ML (PF) SYRINGE
PREFILLED_SYRINGE | INTRAVENOUS | Status: AC
  Filled 2021-07-15: qty 10

## 2021-07-15 MED ORDER — SUGAMMADEX SODIUM 200 MG/2ML IV SOLN
INTRAVENOUS | Status: DC | PRN
  Administered 2021-07-15: 400 mg via INTRAVENOUS

## 2021-07-15 MED ORDER — PHYTONADIONE 5 MG PO TABS
2.5000 mg | ORAL_TABLET | Freq: Once | ORAL | Status: AC
  Administered 2021-07-15: 2.5 mg via ORAL
  Filled 2021-07-15: qty 1

## 2021-07-15 MED ORDER — ONDANSETRON HCL 4 MG/2ML IJ SOLN
INTRAMUSCULAR | Status: DC | PRN
  Administered 2021-07-15: 4 mg via INTRAVENOUS

## 2021-07-15 MED ORDER — BUPIVACAINE LIPOSOME 1.3 % IJ SUSP
INTRAMUSCULAR | Status: DC | PRN
  Administered 2021-07-15: 20 mL

## 2021-07-15 MED ORDER — LIDOCAINE HCL (PF) 2 % IJ SOLN
INTRAMUSCULAR | Status: AC
  Filled 2021-07-15: qty 5

## 2021-07-15 MED ORDER — LIDOCAINE HCL (CARDIAC) PF 100 MG/5ML IV SOSY
PREFILLED_SYRINGE | INTRAVENOUS | Status: DC | PRN
  Administered 2021-07-15: 100 mg via INTRATRACHEAL

## 2021-07-15 MED ORDER — SODIUM CHLORIDE 0.9% IV SOLUTION: Freq: Once | INTRAVENOUS | Status: AC

## 2021-07-15 MED ORDER — ROCURONIUM BROMIDE 10 MG/ML (PF) SYRINGE
PREFILLED_SYRINGE | INTRAVENOUS | Status: DC | PRN
  Administered 2021-07-15: 50 mg via INTRAVENOUS

## 2021-07-15 MED ORDER — WARFARIN SODIUM 5 MG PO TABS
5.0000 mg | ORAL_TABLET | Freq: Once | ORAL | Status: AC
  Administered 2021-07-15: 5 mg via ORAL
  Filled 2021-07-15 (×2): qty 1

## 2021-07-15 MED ORDER — FENTANYL CITRATE (PF) 100 MCG/2ML IJ SOLN
INTRAMUSCULAR | Status: AC
  Filled 2021-07-15: qty 4

## 2021-07-15 MED ORDER — HYDROMORPHONE HCL 1 MG/ML IJ SOLN
0.5000 mg | INTRAMUSCULAR | Status: DC | PRN
Start: 2021-07-15 — End: 2021-07-16

## 2021-07-15 MED ORDER — SODIUM CHLORIDE 0.9 % IV SOLN
2.0000 g | INTRAVENOUS | Status: AC
  Administered 2021-07-15: 2 g via INTRAVENOUS
  Filled 2021-07-15 (×2): qty 2

## 2021-07-15 MED ORDER — HYDROMORPHONE HCL 1 MG/ML IJ SOLN
0.2500 mg | INTRAMUSCULAR | Status: DC | PRN
  Administered 2021-07-15 (×2): 0.5 mg via INTRAVENOUS
  Filled 2021-07-15: qty 0.5

## 2021-07-15 MED ORDER — SUCCINYLCHOLINE CHLORIDE 200 MG/10ML IV SOSY
PREFILLED_SYRINGE | INTRAVENOUS | Status: AC
  Filled 2021-07-15: qty 10

## 2021-07-15 MED ORDER — BUPIVACAINE LIPOSOME 1.3 % IJ SUSP
INTRAMUSCULAR | Status: AC
  Filled 2021-07-15: qty 20

## 2021-07-15 MED ORDER — SODIUM CHLORIDE 0.9 % IR SOLN
Status: DC | PRN
  Administered 2021-07-15: 1000 mL

## 2021-07-15 MED ORDER — DEXMEDETOMIDINE (PRECEDEX) IN NS 20 MCG/5ML (4 MCG/ML) IV SYRINGE
PREFILLED_SYRINGE | INTRAVENOUS | Status: DC | PRN
  Administered 2021-07-15: 16 ug via INTRAVENOUS
  Administered 2021-07-15: 4 ug via INTRAVENOUS

## 2021-07-15 MED ORDER — ONDANSETRON HCL 4 MG/2ML IJ SOLN
INTRAMUSCULAR | Status: AC
  Filled 2021-07-15: qty 2

## 2021-07-15 MED ORDER — SUCCINYLCHOLINE CHLORIDE 200 MG/10ML IV SOSY
PREFILLED_SYRINGE | INTRAVENOUS | Status: DC | PRN
  Administered 2021-07-15: 140 mg via INTRAVENOUS

## 2021-07-15 MED ORDER — DEXAMETHASONE SODIUM PHOSPHATE 10 MG/ML IJ SOLN
INTRAMUSCULAR | Status: DC | PRN
  Administered 2021-07-15: 10 mg via INTRAVENOUS

## 2021-07-15 MED ORDER — CHLORHEXIDINE GLUCONATE CLOTH 2 % EX PADS
6.0000 | MEDICATED_PAD | Freq: Once | CUTANEOUS | Status: AC
  Administered 2021-07-15: 6 via TOPICAL

## 2021-07-15 MED ORDER — PROPOFOL 10 MG/ML IV BOLUS
INTRAVENOUS | Status: DC | PRN
  Administered 2021-07-15: 30 mg via INTRAVENOUS
  Administered 2021-07-15: 50 mg via INTRAVENOUS
  Administered 2021-07-15 (×2): 20 mg via INTRAVENOUS
  Administered 2021-07-15: 100 mg via INTRAVENOUS
  Administered 2021-07-15: 50 mg via INTRAVENOUS

## 2021-07-15 MED ORDER — OXYCODONE HCL 5 MG PO TABS
5.0000 mg | ORAL_TABLET | ORAL | Status: DC | PRN
  Administered 2021-07-15 – 2021-07-16 (×2): 5 mg via ORAL
  Filled 2021-07-15 (×3): qty 1

## 2021-07-15 MED ORDER — LACTATED RINGERS IV SOLN
INTRAVENOUS | Status: DC | PRN
Start: 2021-07-15 — End: 2021-07-15

## 2021-07-15 MED ORDER — PROPOFOL 10 MG/ML IV BOLUS
INTRAVENOUS | Status: AC
  Filled 2021-07-15: qty 20

## 2021-07-15 MED ORDER — HYDROMORPHONE HCL 1 MG/ML IJ SOLN
INTRAMUSCULAR | Status: AC
  Filled 2021-07-15: qty 0.5

## 2021-07-15 MED ORDER — FENTANYL CITRATE (PF) 100 MCG/2ML IJ SOLN
INTRAMUSCULAR | Status: DC | PRN
  Administered 2021-07-15 (×4): 50 ug via INTRAVENOUS

## 2021-07-15 MED ORDER — PHENYLEPHRINE 40 MCG/ML (10ML) SYRINGE FOR IV PUSH (FOR BLOOD PRESSURE SUPPORT)
PREFILLED_SYRINGE | INTRAVENOUS | Status: AC
  Filled 2021-07-15: qty 20

## 2021-07-15 MED ORDER — WARFARIN - PHARMACIST DOSING INPATIENT: Freq: Every day | Status: DC

## 2021-07-15 MED ORDER — DEXAMETHASONE SODIUM PHOSPHATE 10 MG/ML IJ SOLN
INTRAMUSCULAR | Status: AC
  Filled 2021-07-15: qty 1

## 2021-07-15 SURGICAL SUPPLY — 44 items
APPLICATOR ARISTA FLEXITIP XL (MISCELLANEOUS) ×2 IMPLANT
BAG RETRIEVAL 10 (BASKET) ×1
BLADE SURG 15 STRL LF DISP TIS (BLADE) ×1 IMPLANT
BLADE SURG 15 STRL SS (BLADE) ×2
CHLORAPREP W/TINT 26 (MISCELLANEOUS) ×2 IMPLANT
CLOTH BEACON ORANGE TIMEOUT ST (SAFETY) ×2 IMPLANT
COVER LIGHT HANDLE STERIS (MISCELLANEOUS) ×4 IMPLANT
CUTTER FLEX LINEAR 45M (STAPLE) ×2 IMPLANT
DERMABOND ADVANCED (GAUZE/BANDAGES/DRESSINGS) ×1
DERMABOND ADVANCED .7 DNX12 (GAUZE/BANDAGES/DRESSINGS) ×1 IMPLANT
ELECT REM PT RETURN 9FT ADLT (ELECTROSURGICAL) ×2
ELECTRODE REM PT RTRN 9FT ADLT (ELECTROSURGICAL) ×1 IMPLANT
GLOVE SURG ENC MOIS LTX SZ6.5 (GLOVE) ×4 IMPLANT
GLOVE SURG UNDER POLY LF SZ6.5 (GLOVE) ×4 IMPLANT
GLOVE SURG UNDER POLY LF SZ7 (GLOVE) ×6 IMPLANT
GOWN STRL REUS W/TWL LRG LVL3 (GOWN DISPOSABLE) ×4 IMPLANT
HEMOSTAT ARISTA ABSORB 3G PWDR (HEMOSTASIS) ×2 IMPLANT
INST SET LAPROSCOPIC AP (KITS) ×2 IMPLANT
KIT TURNOVER KIT A (KITS) ×2 IMPLANT
MANIFOLD NEPTUNE II (INSTRUMENTS) ×2 IMPLANT
NEEDLE HYPO 18GX1.5 BLUNT FILL (NEEDLE) ×2 IMPLANT
NEEDLE HYPO 21X1.5 SAFETY (NEEDLE) ×2 IMPLANT
NEEDLE INSUFFLATION 14GA 120MM (NEEDLE) ×2 IMPLANT
NS IRRIG 1000ML POUR BTL (IV SOLUTION) ×2 IMPLANT
PACK LAP CHOLE LZT030E (CUSTOM PROCEDURE TRAY) ×2 IMPLANT
PAD ARMBOARD 7.5X6 YLW CONV (MISCELLANEOUS) ×2 IMPLANT
RELOAD 45 VASCULAR/THIN (ENDOMECHANICALS) IMPLANT
RELOAD STAPLE TA45 3.5 REG BLU (ENDOMECHANICALS) ×2 IMPLANT
SET BASIN LINEN APH (SET/KITS/TRAYS/PACK) ×2 IMPLANT
SET TUBE IRRIG SUCTION NO TIP (IRRIGATION / IRRIGATOR) IMPLANT
SET TUBE SMOKE EVAC HIGH FLOW (TUBING) ×2 IMPLANT
SHEARS HARMONIC ACE PLUS 36CM (ENDOMECHANICALS) ×2 IMPLANT
SUT MNCRL AB 4-0 PS2 18 (SUTURE) ×4 IMPLANT
SUT VICRYL 0 UR6 27IN ABS (SUTURE) ×2 IMPLANT
SYR 20ML LL LF (SYRINGE) ×4 IMPLANT
SYS BAG RETRIEVAL 10MM (BASKET) ×1
SYSTEM BAG RETRIEVAL 10MM (BASKET) ×1 IMPLANT
TRAY FOLEY W/BAG SLVR 16FR (SET/KITS/TRAYS/PACK) ×2
TRAY FOLEY W/BAG SLVR 16FR ST (SET/KITS/TRAYS/PACK) ×1 IMPLANT
TROCAR ENDO BLADELESS 11MM (ENDOMECHANICALS) ×2 IMPLANT
TROCAR ENDO BLADELESS 12MM (ENDOMECHANICALS) ×2 IMPLANT
TROCAR XCEL NON-BLD 5MMX100MML (ENDOMECHANICALS) ×2 IMPLANT
TUBE CONNECTING 12X1/4 (SUCTIONS) ×2 IMPLANT
WARMER LAPAROSCOPE (MISCELLANEOUS) ×2 IMPLANT

## 2021-07-15 NOTE — Progress Notes (Signed)
PROGRESS NOTE    Patient: Randall Sullivan                            PCP: Noreene Larsson, NP                    DOB: Oct 31, 1950            DOA: 07/14/2021 QQP:[id number]             DOS: 07/15/2021, 12:00 PM   LOS: 1 day   Date of Service: The patient was seen and examined on 07/15/2021  Subjective:   The patient was seen and examined this morning. Hemodynamically stable INR still 2.3 Still complaining of : Abdominal tenderness, but tolerable with pain medication   Brief Narrative:  Randall Sullivan is a 70 y.o. male with medical history significant for recent CVA, diabetes mellitus, hypertension, paroxysmal atrial fibrillation, lung cancer. Patient presented to the ED with complaints of abdominal pain of 2 months duration-this abdominal pain was on the left right upper and lower abdomen, nonspecific. But yesterday and today pain got worse and was particularly located in the right lower quadrant.  Reports some episodes of dry heaving without actual vomiting.  ED Course: Medically stable T-max 98.3, WBC 14.5, potassium 4.5, INR 2.3   Abdominal CT W contrast suggesting acute appendicitis without perforation or abscess.  Also showed an infrarenal AAA. EDP talked to Dr. Constance Haw, will be seen in the morning, Antibiotics, reverse coumadin. Patient was started on Zosyn.  500 mill bolus given.  2.5 mg oral vitamin K given  Assessment & Plan:   Principal Problem:   Acute appendicitis Active Problems:   Essential (primary) hypertension   Emphysema of lung (HCC)   PAF (paroxysmal atrial fibrillation) ([place])   Primary lung adenocarcinoma ([place])   Diabetes mellitus ([place])   Abdominal aortic aneurysm (AAA)    Acute appendicitis -Still complaining abdominal pain, but improved with IV pain -N.p.o. pending surgical evaluation -2.3, another 2.5 mg vitamin K will be given along with 2 units of FFP -No surgery Dr. Constance Haw following anticipated appendectomy today  -Lukocytosis of 14.5.  CT suggesting  acute appendicitis without signs of perforation or abscess. -Continue IV fluids -IV Zosyn -started-we will continue -IV Dilaudid 0.5mg  as needed - S/P 554mls bolus given, continue N/s 75cc/hr x 15hrs   Infrarenal Abdominal aortic aneurysm -Currently stable - Infrarenal abdominal aortic aneurysm measuring up to 4.0 x 3.8 cm with a large burden of eccentric mural thrombus. Recommend follow-up every 12 months and vascular consultation. - EDP talked to vascular surgeon- Dr. Carlis Abbott,  no anticoagulation needed, patient to follow-up with vascular as outpatient.   Paroxysmal atrial fibrillation -in Sinus rhythm, -Home medications of warfarin-on hold, INR 2.3 (reversing with vitamin K for surgical intervention) -Resume metoprolol, diltiazem -2.5 mg oral vit k given, -Daily INR    Non-small cell lung cancer - s/p lower lobectomy with lymph node dissection 2017, and wedge resection of right upper lobe March 2020.  Currently under surveillance.  Follows with Dr. Earlie Server. -Stable   Controlled diabetes mellitus  -CBG every 4 hours with SSI coverage (as long as patient is n.p.o., once tolerating p.o. we will switch to q. ACH S) - A1c 5.6. -Fasting CBGs daily   HTN- -Stable -Resume diltiazem and Cardizem   Emphysema  -Stable -satting 100% on 2 L of oxygen -Resume home bronchodilator regimen   CVA- diagnosed June 2022.  Patient has right-sided  deficits not apparent on exam.  Ambulates with or without the cane.  Per daughter is main problem has been memory issues since the stroke. - Resume statins - Anticoag on hold for now-family and patient understands the risk and benefit of holding anticoagulation at this time.   BPH -Resume tamsulosin   DVT prophylaxis: SCDs (holding home medication agree with) Code Status: Full code Family Communication: Daughter at bedside Disposition Plan:  -From home, likely be discharged home in 1-2 days Consults called: Gen Surg Admission status: Inpt, tele    --------------------------------------------------------------------------------------------------------------------------------- Cultures; None   Antimicrobials: Enteric presurgery IV abx Zosyn   Level of care: Telemetry   Procedures:   No admission procedures for hospital encounter.    Antimicrobials:  Anti-infectives (From admission, onward)    Start     Dose/Rate Route Frequency Ordered Stop   07/15/21 1030  cefoTEtan (CEFOTAN) 2 g in sodium chloride 0.9 % 100 mL IVPB        2 g 200 mL/hr over 30 Minutes Intravenous On call to O.R. 07/15/21 0933 07/16/21 0559   07/15/21 0200  piperacillin-tazobactam (ZOSYN) IVPB 3.375 g        3.375 g 12.5 mL/hr over 240 Minutes Intravenous Every 8 hours 07/14/21 1935     07/14/21 1700  piperacillin-tazobactam (ZOSYN) IVPB 3.375 g        3.375 g 100 mL/hr over 30 Minutes Intravenous  Once 07/14/21 1659 07/14/21 1924        Medication:   diltiazem  120 mg Oral Daily   metoprolol succinate  50 mg Oral Daily   pravastatin  80 mg Oral q1800   tamsulosin  0.4 mg Oral Daily   traZODone  50 mg Oral QHS    acetaminophen **OR** acetaminophen, albuterol, HYDROmorphone (DILAUDID) injection, ondansetron **OR** ondansetron (ZOFRAN) IV, polyethylene glycol   Objective:   Vitals:   07/15/21 1008 07/15/21 1028 07/15/21 1123 07/15/21 1145  BP: 125/80 116/74  128/74  Pulse: 73 67  70  Resp: 20 (!) 21  20  Temp: 98.7 F (37.1 C) 98.6 F (37 C)  98.1 F (36.7 C)  TempSrc: Oral Oral  Oral  SpO2: 99% 100% 99% 100%  Weight:      Height:        Intake/Output Summary (Last 24 hours) at 07/15/2021 1200 Last data filed at 07/15/2021 1145 Gross per 24 hour  Intake 1106.9 ml  Output --  Net 1106.9 ml   Filed Weights   07/14/21 2037  Weight: 96.7 kg     Examination:   Physical Exam  Constitution:  Alert, cooperative, no distress,  Appears calm and comfortable  Psychiatric: Normal and stable mood and affect, cognition intact,    HEENT: Normocephalic, PERRL, otherwise with in Normal limits  Chest:Chest symmetric Cardio vascular:  S1/S2, RRR, No murmure, No Rubs or Gallops  pulmonary: Clear to auscultation bilaterally, respirations unlabored, negative wheezes / crackles Abdomen: Soft, right lower quadrant abdominal tenderness 1 rebound tenderness, non-distended, bowel sounds,no masses, no organomegaly Muscular skeletal: Limited exam - in bed, able to move all 4 extremities, Normal strength,  Neuro: CNII-XII intact. , normal motor and sensation, reflexes intact  Extremities: No pitting edema lower extremities, +2 pulses  Skin: Dry, warm to touch, negative for any Rashes, No open wounds Wounds: per nursing documentation    ------------------------------------------------------------------------------------------------------------------------------------------    LABs:  CBC Latest Ref Rng & Units 07/15/2021 07/14/2021 07/05/2021  WBC 4.0 - 10.5 K/uL 8.2 14.5(H) 6.1  Hemoglobin 13.0 - 17.0 g/dL  14.4 16.7 16.4  Hematocrit 39.0 - 52.0 % 47.3 52.8(H) 51.9(H)  Platelets 150 - 400 K/uL 200 239 248   CMP Latest Ref Rng & Units 07/15/2021 07/14/2021 07/05/2021  Glucose 70 - 99 mg/dL 102(H) 108(H) 83  BUN 8 - 23 mg/dL 22 23 20   Creatinine 0.61 - 1.24 mg/dL 1.30(H) 1.33(H) 1.29(H)  Sodium 135 - 145 mmol/L 137 135 139  Potassium 3.5 - 5.1 mmol/L 4.0 5.4(H) 4.6  Chloride 98 - 111 mmol/L 101 98 97  CO2 22 - 32 mmol/L 29 29 24   Calcium 8.9 - 10.3 mg/dL 8.7(L) 9.2 10.1  Total Protein 6.5 - 8.1 g/dL - 8.0 7.7  Total Bilirubin 0.3 - 1.2 mg/dL - 0.5 0.5  Alkaline Phos 38 - 126 U/L - 41 48  AST 15 - 41 U/L - 29 26  ALT 0 - 44 U/L - 28 29       Micro Results Recent Results (from the past 240 hour(s))  Resp Panel by RT-PCR (Flu A&B, Covid) Nasopharyngeal Swab     Status: None   Collection Time: 07/14/21  6:01 PM   Specimen: Nasopharyngeal Swab; Nasopharyngeal(NP) swabs in vial transport medium  Result Value Ref Range  Status   SARS Coronavirus 2 by RT PCR NEGATIVE NEGATIVE Final    Comment: (NOTE) SARS-CoV-2 target nucleic acids are NOT DETECTED.  The SARS-CoV-2 RNA is generally detectable in upper respiratory specimens during the acute phase of infection. The lowest concentration of SARS-CoV-2 viral copies this assay can detect is 138 copies/mL. A negative result does not preclude SARS-Cov-2 infection and should not be used as the sole basis for treatment or other patient management decisions. A negative result may occur with  improper specimen collection/handling, submission of specimen other than nasopharyngeal swab, presence of viral mutation(s) within the areas targeted by this assay, and inadequate number of viral copies(<138 copies/mL). A negative result must be combined with clinical observations, patient history, and epidemiological information. The expected result is Negative.  Fact Sheet for Patients:  EntrepreneurPulse.com.au  Fact Sheet for Healthcare Providers:  IncredibleEmployment.be  This test is no t yet approved or cleared by the [place] FDA and  has been authorized for detection and/or diagnosis of SARS-CoV-2 by FDA under an Emergency Use Authorization (EUA). This EUA will remain  in effect (meaning this test can be used) for the duration of the COVID-19 declaration under Section 564(b)(1) of the Act, 21 U.S.C.section 360bbb-3(b)(1), unless the authorization is terminated  or revoked sooner.       Influenza A by PCR NEGATIVE NEGATIVE Final   Influenza B by PCR NEGATIVE NEGATIVE Final    Comment: (NOTE) The Xpert Xpress SARS-CoV-2/FLU/RSV plus assay is intended as an aid in the diagnosis of influenza from Nasopharyngeal swab specimens and should not be used as a sole basis for treatment. Nasal washings and aspirates are unacceptable for Xpert Xpress SARS-CoV-2/FLU/RSV testing.  Fact Sheet for  Patients: EntrepreneurPulse.com.au  Fact Sheet for Healthcare Providers: IncredibleEmployment.be  This test is not yet approved or cleared by the [place] FDA and has been authorized for detection and/or diagnosis of SARS-CoV-2 by FDA under an Emergency Use Authorization (EUA). This EUA will remain in effect (meaning this test can be used) for the duration of the COVID-19 declaration under Section 564(b)(1) of the Act, 21 U.S.C. section 360bbb-3(b)(1), unless the authorization is terminated or revoked.  Performed at [facility], [address], [place], [place] [address]   Surgical pcr screen  Status: None   Collection Time: 07/14/21  9:19 PM   Specimen: Nasal Mucosa; Nasal Swab  Result Value Ref Range Status   MRSA, PCR NEGATIVE NEGATIVE Final   Staphylococcus aureus NEGATIVE NEGATIVE Final    Comment: (NOTE) The Xpert SA Assay (FDA approved for NASAL specimens in patients 78 years of age and older), is one component of a comprehensive surveillance program. It is not intended to diagnose infection nor to guide or monitor treatment. Performed at [facility], [address], [place],  [address]     Radiology Reports CT ABDOMEN PELVIS W CONTRAST  Result Date: 07/14/2021 CLINICAL DATA:  Right lower quadrant abdominal pain, appendicitis suspected EXAM: CT ABDOMEN AND PELVIS WITH CONTRAST TECHNIQUE: Multidetector CT imaging of the abdomen and pelvis was performed using the standard protocol following bolus administration of intravenous contrast. CONTRAST:  168mL OMNIPAQUE IOHEXOL 300 MG/ML  SOLN COMPARISON:  None. FINDINGS: Lower chest: No acute abnormality.  Emphysema. Hepatobiliary: No solid liver abnormality is seen. No gallstones, gallbladder wall thickening, or biliary dilatation. Pancreas: Unremarkable. No pancreatic ductal dilatation or surrounding inflammatory changes. Spleen: Normal in size without significant abnormality.  Adrenals/Urinary Tract: Adrenal glands are unremarkable. Kidneys are normal, without renal calculi, solid lesion, or hydronephrosis. Bladder is unremarkable. Stomach/Bowel: Stomach is within normal limits. Thickened, fluid-filled appendix measuring up to 1.0 cm in caliber with adjacent fat stranding, containing a small appendicolith near the ostium (series 5, image 61, series 2, image 62). No evidence of bowel wall thickening, distention, or inflammatory changes. Sigmoid diverticula. Vascular/Lymphatic: Aortic atherosclerosis. 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 No enlarged abdominal or pelvic lymph nodes. Reproductive: No mass or other significant abnormality. Other: Small, fat containing bilateral inguinal hernias. No abdominopelvic ascites. Musculoskeletal: No acute or significant osseous findings. IMPRESSION: 1. Thickened, fluid-filled appendix measuring up to 1.0 cm in caliber with adjacent fat stranding, containing a small appendicolith near the ostium. Findings are consistent with acute appendicitis. No evidence of perforation or abscess. 2. 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 Recommend follow-up every 12 months and vascular consultation. This recommendation follows ACR consensus guidelines: White Paper of the ACR Incidental Findings Committee II on Vascular Findings. J Am Coll Radiol 2013; 10:789-794. Aortic Atherosclerosis (ICD10-I70.0). Electronically Signed   By: Delanna Ahmadi M.D.   On: 07/14/2021 16:55    SIGNED: Deatra James, MD, FHM. [facility],  Pager (please use [email or web address] to page/text) Please use Epic Secure Chat for non-urgent communication (7AM-7PM)  If 7PM-7AM, please contact night-coverage [email or web address], 07/15/2021, 12:00 PM

## 2021-07-15 NOTE — Op Note (Signed)
[facility]  Date of Surgery: 07/14/2021 - 07/15/2021  Admit Date: 07/14/2021   Performing Service: General  Surgeon(s) and Role:    Virl Cagey, MD - Primary   Pre-operative Diagnosis: Acute Appendicitis  Post-operative Diagnosis: Acute Appendicitis  Procedure Performed: Laparoscopic Appendectomy   Surgeon: Lanell Matar. Constance Haw, MD   Assistant: No qualified resident was available.   Anesthesia: General   Findings:  The appendix was found to be inflamed. There were not signs of necrosis. There was not perforation. There was not abscess formation.   Estimated Blood Loss:  5 cc    Specimens:Appendix   Complications: None; patient tolerated the procedure well.   Disposition: PACU - hemodynamically stable.   Condition: stable   Indications: The patient presented with a 1 day history of right-sided abdominal pain. A CT revealed findings consistent with acute appendicitis. His INR was reversed for surgery.   Procedure Details  Prior to the procedure, the risks, benefits, complications, treatment options, and expected outcomes were discussed with the patient and/or family, including but not limited to the risk of bleeding, infection, finding of a normal appendix, and the need for conversion to an open procedure. There was concurrence with the proposed plan and informed consent was obtained. The patient was taken to the operating room, identified as Randall Sullivan and the procedure verified as Laproscopic Appendectomy.    The patient was placed in the supine position and general anesthesia was induced, along with placement of orogastric tube, SCD's, and a Foley catheter. The abdomen was prepped and draped in a sterile fashion. The abdomen was entered with Veress technique in the infraumbilical incision. Intraperitoneal placement was confirmed with saline drop, low entry pressures, and easy insufflation. A 11 mm optiview trocar was placed under direct  visualization with a 0 degree scope. The 10 mm 0 degree scope was placed in the abdomen and no evidence of injury was identified. A 12 mm port was placed in the left lower quadrant of the abdomen after skin incision with trocar placement under direct vision. A careful evaluation of the entire abdomen was carried out. An additional 5 mm port was placed in the suprapubic area under direct vision.  The patient was placed in Trendelenburg and left lateral decubitus position. The small intestines were retracted in the cephalad and left lateral direction away from the pelvis and right lower quadrant. The patient was found to have an dilated and inflamed appendix. There was not evidence of perforation.   The appendix was carefully dissected. A window was made in the mesoappendix at the base of the appendix. The appendix was divided at its base using standard GIA stapler. Minimal appendiceal stump was left in place. The mesoappendix was taken with the harmonic energy device. The appendix was placed within an Endocatch specimen bag. There was no evidence of bleeding, leakage, or complication after division of the appendix. Some Arista was used on the staple line.   Any remaining blood or pus was suctioned out from the abdomen, hemostasis was confirmed. The endocatch bag was removed via the 12 mm port, then the abdomen desufflated. The appendix was passed off the field as a specimen.   The the 12 mm and 10 mm port sites were closed with a 0 Vicryl suture. The trocar site skin wounds were closed using subcuticular 4-0 Monocryl suture and dermabond. The patient was then awakened from general anesthesia, extubated, and taken to PACU for recovery.   Instrument, sponge, and needle counts were correct  at the conclusion of the case.   Curlene Labrum, MD [facility] [address] [place], [place] [address] [phone]([email or web address])

## 2021-07-15 NOTE — Progress Notes (Signed)
[facility]  INR down to 1.7. will transfuse the second unit FFP and plan for OR around 1:30. OR team updated.  Curlene Labrum, MD [facility] [address] [place], [place] [address] [phone] (office)

## 2021-07-15 NOTE — Progress Notes (Signed)
ANTICOAGULATION CONSULT NOTE - Initial Consult  Pharmacy Consult for Coumadin Indication: atrial fibrillation  Allergies  Allergen Reactions   No Known Allergies     Patient Measurements: Height: 5\' 10"  (177.8 cm) Weight: 96.7 kg (213 lb 3.2 oz) IBW/kg (Calculated) : 73  Vital Signs: Temp: 97.8 F (36.6 C) (10/16 1345) Temp Source: Oral (10/16 1345) BP: 131/77 (10/16 1345) Pulse Rate: 66 (10/16 1345)  Labs: Recent Labs    07/14/21 1241 07/14/21 1606 07/15/21 0542 07/15/21 1149 07/15/21 1304  HGB 16.7  --  14.4  --   --   HCT 52.8*  --  47.3  --   --   PLT 239  --  200  --   --   LABPROT  --    < > 24.9* 20.3* 18.6*  INR  --    < > 2.3* 1.7* 1.6*  CREATININE 1.33*  --  1.30*  --   --    < > = values in this interval not displayed.    Estimated Creatinine Clearance: 60.8 mL/min (A) (by C-G formula based on SCr of 1.3 mg/dL (H)).   Medical History: Past Medical History:  Diagnosis Date   A-fib ([place]) 2017   after last lung surgery patient had a history if Afib documented in hospital    Allergy    Arthritis    back & legs ( knees)   Cancer ([place])    lung   Complication of anesthesia    agitated upon waking from anesth.    Emphysema    History of lung cancer    Hyperlipidemia    Hypertension    Oxygen dependent    2L continuous   Stroke (HCC)    Tobacco abuse     Medications:  See med rec  Assessment: 70 y.o. male with acute appendicitis. RLQ pain that started yesterday and got significant worse and was associated with dry heaving but no vomiting. Patient is chronically anticoagulated on coumadin for afib. Patient was reversed with 10/15 1815 Vit,K po 2.5mg  and repeated in AM 10/6  Vit k po 2.5mg  . Also, Fresh frozen plasma given. INR reversed to 1.6 and patient underwent lap appendectomy. Surgeon okay to restart coumadin tonight after 9pm Home dose is 5mg  po daily  Goal of Therapy:  INR 2-3 Monitor platelets by anticoagulation protocol: Yes   Plan:   Coumadin 5mg  PO today at 2100 PT-INR daily Monitor for S/S of bleeding  Isac Sarna, BS [place] [place], BCPS Clinical Pharmacist Pager [phone] 07/15/2021,3:08 PM

## 2021-07-15 NOTE — Anesthesia Preprocedure Evaluation (Addendum)
Anesthesia Evaluation  Patient identified by MRN, date of birth, ID band Patient awake    Reviewed: Allergy & Precautions, NPO status , Patient's Chart, lab work & pertinent test results, reviewed documented beta blocker date and time   History of Anesthesia Complications (+) Emergence Delirium and history of anesthetic complications  Airway Mallampati: II  TM Distance: >3 FB Neck ROM: Full    Dental  (+) Dental Advisory Given, Missing   Pulmonary COPD,  COPD inhaler and oxygen dependent, former smoker,  Lung cancer, bilateral lung cancer,     + decreased breath sounds      Cardiovascular Exercise Tolerance: Poor hypertension, Pt. on medications and Pt. on home beta blockers + CAD and + Peripheral Vascular Disease (AAA)  Normal cardiovascular exam+ dysrhythmias Atrial Fibrillation  Rhythm:Regular Rate:Normal  14-Jul-2021 17:36:58 [facility]-ER ROUTINE RECORD 1951-01-10 (51 yr) Male Caucasian Vent. rate 71 BPM PR interval 153 ms QRS duration 93 ms QT/QTcB 415/451 ms P-R-T axes 83 98 74 Sinus rhythm Right axis deviation   Neuro/Psych CVA (recnt stroke - 4 months ago), Residual Symptoms negative psych ROS   GI/Hepatic negative GI ROS, Neg liver ROS,   Endo/Other  diabetes, Well Controlled, Type 2Hypothyroidism   Renal/GU Renal InsufficiencyRenal disease     Musculoskeletal  (+) Arthritis , Osteoarthritis,    Abdominal   Peds  Hematology   Anesthesia Other Findings 1. Left ventricular ejection fraction, by estimation, is 50 to 55%. The left ventricle has low normal function. Left ventricular endocardial  border not optimally defined to evaluate regional wall motion. Left ventricular diastolic parameters are consistent with Grade I diastolic dysfunction (impaired relaxation).  2. Right ventricular systolic function is normal. The right ventricular size is normal.  3. The mitral valve is normal  in structure. No evidence of mitral valve  regurgitation. No evidence of mitral stenosis.  4. The aortic valve is tricuspid. Aortic valve regurgitation is not visualized. No aortic stenosis is present.  5. The inferior vena cava is normal in size with greater than 50% respiratory variability, suggesting right atrial pressure of 3 mmHg.  6. Agitated saline contrast bubble study was negative, with no evidence of any interatrial shunt.   Reproductive/Obstetrics negative OB ROS                           Anesthesia Physical Anesthesia Plan  ASA: 4 and emergent  Anesthesia Plan: General   Post-op Pain Management:    Induction: Intravenous  PONV Risk Score and Plan: 4 or greater and Ondansetron and Dexamethasone  Airway Management Planned: Oral ETT  Additional Equipment:   Intra-op Plan:   Post-operative Plan: Extubation in OR and Possible Post-op intubation/ventilation  Informed Consent: I have reviewed the patients History and Physical, chart, labs and discussed the procedure including the risks, benefits and alternatives for the proposed anesthesia with the patient or authorized representative who has indicated his/her understanding and acceptance.     Dental advisory given  Plan Discussed with: Surgeon  Anesthesia Plan Comments: (Because of recent stroke, increased risk of stroke and current balance issues may get worse. Discussed with patient and family, agreed to proceed. )       Anesthesia Quick Evaluation

## 2021-07-15 NOTE — Transfer of Care (Signed)
Immediate Anesthesia Transfer of Care Note  Patient: Randall Sullivan  Procedure(s) Performed: APPENDECTOMY LAPAROSCOPIC  Patient Location: PACU  Anesthesia Type:General  Level of Consciousness: awake, pateint uncooperative and confused  Airway & Oxygen Therapy: Patient Spontanous Breathing and Patient connected to nasal cannula oxygen  Post-op Assessment: Report given to RN and Post -op Vital signs reviewed and stable  Post vital signs: Reviewed and stable  Last Vitals:  Vitals Value Taken Time  BP 112/63 07/15/21 1545  Temp 98.1   Pulse 81 07/15/21 1554  Resp 12 07/15/21 1554  SpO2 92 % 07/15/21 1554  Vitals shown include unvalidated device data.  Last Pain:  Vitals:   07/15/21 1545  TempSrc:   PainSc: 4       Patients Stated Pain Goal: 3 (70/34/03 5248)  Complications: immediately after patient agitated, dexmedetomidine  And  propofol were given.

## 2021-07-15 NOTE — Anesthesia Procedure Notes (Signed)
Procedure Name: Intubation Date/Time: 07/15/2021 2:13 PM Performed by: Denese Killings, MD Pre-anesthesia Checklist: Patient identified, Emergency Drugs available, Suction available and Patient being monitored Patient Re-evaluated:Patient Re-evaluated prior to induction Oxygen Delivery Method: Circle system utilized Preoxygenation: Pre-oxygenation with 100% oxygen Induction Type: IV induction and Rapid sequence Laryngoscope Size: 3 Grade View: Grade I Tube type: Oral Tube size: 7.5 mm Number of attempts: 1 Airway Equipment and Method: Stylet Placement Confirmation: ETT inserted through vocal cords under direct vision, positive ETCO2 and breath sounds checked- equal and bilateral Secured at: 21 cm Tube secured with: Tape Dental Injury: Teeth and Oropharynx as per pre-operative assessment

## 2021-07-15 NOTE — Anesthesia Postprocedure Evaluation (Signed)
Anesthesia Post Note  Patient: Randall Sullivan  Procedure(s) Performed: APPENDECTOMY LAPAROSCOPIC  Patient location during evaluation: PACU Anesthesia Type: General Level of consciousness: awake and alert, oriented and patient cooperative Pain management: pain level controlled Vital Signs Assessment: post-procedure vital signs reviewed and stable Respiratory status: spontaneous breathing and respiratory function stable Cardiovascular status: blood pressure returned to baseline and stable Postop Assessment: no apparent nausea or vomiting Anesthetic complications: no   No notable events documented.   Last Vitals:  Vitals:   07/15/21 1530 07/15/21 1545  BP: 107/63 112/63  Pulse: 87 80  Resp:    Temp:    SpO2: 95% 93%    Last Pain:  Vitals:   07/15/21 1545  TempSrc:   PainSc: 4                  Joline Encalada C Siddhanth Denk

## 2021-07-15 NOTE — Progress Notes (Signed)
Inr 1.6 and will continue to decrease. Received 2 FFP and vitamin K. Proceed with surgery.  Horton Marshall

## 2021-07-15 NOTE — Progress Notes (Addendum)
[facility]  Surgery went well, no signs of bleeding. Ok to restart coumadin tonight after 9pm (6 hours post op).  Updated wife.   Curlene Labrum, MD [facility] [address] [place], [place] [address] [phone] (office)

## 2021-07-15 NOTE — Consult Note (Signed)
[facility] Consult  Reason for Consult: Acute appendicitis  Referring Physician: Dr. Jamesetta Geralds   Chief Complaint   Abdominal Pain     HPI: Randall Sullivan is a 70 y.o. male with acute appendicitis on CT scan. He has had a few months of nonspecific abdominal pain but started to have RLQ pain that started yesterday and got significant worse and was associated with dry heaving but no vomiting. He had a prior stroke in June and had only been on Aspirin leading up to it per his families report.  He had some right sided weakness and memory issues but has otherwise improved. He still walks with a cane.  He had an INR 2.3 on coumadin.  We attempted Vit K last night but the INR is still 2.3 this AM. He had IV antibiotics started overnight and his pain is improved he says.   His wife is at the bedside. He has had prior Vats for lung cancer in the past.   He uses O2 but only uses it at home on occasion. He is not O2 dependent per the patient and his wife. He has never had a heart attack.   Past Medical History:  Diagnosis Date  . A-fib ([place]) 2017   after last lung surgery patient had a history if Afib documented in hospital   . Allergy   . Arthritis    back & legs ( knees)  . Cancer ([place])    lung  . Complication of anesthesia    agitated upon waking from anesth.   . Emphysema   . History of lung cancer   . Hyperlipidemia   . Hypertension   . Oxygen dependent    2L continuous  . Stroke ([place])   . Tobacco abuse     Past Surgical History:  Procedure Laterality Date  . HERNIA REPAIR Bilateral 8828   umbilical  . VIDEO ASSISTED THORACOSCOPY Right 12/21/2018   Procedure: VIDEO ASSISTED THORACOSCOPY AND RESECTION OF BLEBS RIGHT LOWER LOBE;  Surgeon: [name], MD;  Location: [facility];  Service: Thoracic;  Laterality: Right;  [name] VIDEO ASSISTED THORACOSCOPY (VATS)/ LOBECTOMY Left 08/01/2016   Procedure: VIDEO ASSISTED THORACOSCOPY (VATS)/ LEFT UPPER LOBECTOMY with lymph  node sampling and onQ placement;  Surgeon: [name], MD;  Location: [facility];  Service: Thoracic;  Laterality: Left;  [name] VIDEO ASSISTED THORACOSCOPY (VATS)/WEDGE RESECTION Right 12/09/2018   Procedure: VIDEO ASSISTED THORACOSCOPY (VATS)/WEDGE RESECTION;  Surgeon: [name], MD;  Location: [facility];  Service: Thoracic;  Laterality: Right;  [name] VIDEO BRONCHOSCOPY Bilateral 12/21/2018   Procedure: VIDEO BRONCHOSCOPY;  Surgeon: [name], MD;  Location: [facility];  Service: Thoracic;  Laterality: Bilateral;  . VIDEO BRONCHOSCOPY WITH ENDOBRONCHIAL NAVIGATION N/A 07/03/2016   Procedure: VIDEO BRONCHOSCOPY WITH ENDOBRONCHIAL NAVIGATION;  Surgeon: [name], MD;  Location: [facility];  Service: Thoracic;  Laterality: N/A;  . VIDEO BRONCHOSCOPY WITH INSERTION OF INTERBRONCHIAL VALVE (IBV) N/A 12/11/2018   Procedure: VIDEO BRONCHOSCOPY WITH INSERTION OF INTERBRONCHIAL VALVE (IBV);  Surgeon: [name], MD;  Location: [facility] OR;  Service: Thoracic;  Laterality: N/A;  . VIDEO BRONCHOSCOPY WITH INSERTION OF INTERBRONCHIAL VALVE (IBV) N/A 02/10/2019   Procedure: VIDEO BRONCHOSCOPY WITH REMOVAL OF INTERBRONCHIAL VALVE (IBV);  Surgeon: [name], MD;  Location: [facility] OR;  Service: Thoracic;  Laterality: N/A;    Family History  Problem Relation Age of Onset  . Cancer Mother   . Cancer Sister   . Aneurysm Sister   .  Kidney disease Paternal Aunt   . Asthma Brother   . Cancer - Lung Brother   . Cancer Brother     Social History   Tobacco Use  . Smoking status: Former    Packs/day: 1.00    Years: 40.00    Pack years: 40.00    Types: Cigarettes    Quit date: 05/28/2016    Years since quitting: 5.1  . Smokeless tobacco: Never  Vaping Use  . Vaping Use: Never used  Substance Use Topics  . Alcohol use: Not Currently    Comment: quit- 2016  . Drug use: No    Medications: I have reviewed the patient's current medications. Prior to Admission:  Medications  Prior to Admission  Medication Sig Dispense Refill Last Dose  . acetaminophen (TYLENOL) 325 MG tablet Take 2 tablets (650 mg total) by mouth every 6 (six) hours as needed for mild pain (or Fever >/= 101).   Past Week  . albuterol (PROVENTIL) (2.5 MG/3ML) 0.083% nebulizer solution Take 3 mLs (2.5 mg total) by nebulization every 6 (six) hours as needed for wheezing or shortness of breath. 180 mL 3 07/14/2021  . Cholecalciferol (VITAMIN D3) 125 MCG (5000 UT) CAPS Take 1 capsule (5,000 Units total) by mouth daily. 30 capsule 0 07/14/2021  . diltiazem (CARDIZEM CD) 120 MG 24 hr capsule Take 1 capsule (120 mg total) by mouth daily. 30 capsule 0 07/14/2021  . fenofibrate (TRICOR) 145 MG tablet Take 1 tablet (145 mg total) by mouth daily. 90 tablet 1 07/14/2021  . loratadine (CLARITIN) 10 MG tablet Take 1 tablet (10 mg total) by mouth daily. 90 tablet 0 07/14/2021  . meclizine (ANTIVERT) 25 MG tablet Take 1 tablet (25 mg total) by mouth 3 (three) times daily as needed for dizziness. 30 tablet 0 Past Week  . metoprolol succinate (TOPROL-XL) 50 MG 24 hr tablet Take 1 tablet (50 mg total) by mouth daily. Take with or immediately following a meal. 90 tablet 1 07/14/2021 at 0800  . OXYGEN Inhale 2-3 L into the lungs continuous. Hx of lung ca, copd, and emphysema   07/14/2021  . pravastatin (PRAVACHOL) 80 MG tablet Take 1 tablet (80 mg total) by mouth daily. 30 tablet 5 07/14/2021  . tamsulosin (FLOMAX) 0.4 MG CAPS capsule Take 1 capsule (0.4 mg total) by mouth daily. 30 capsule 3 07/14/2021  . traZODone (DESYREL) 50 MG tablet Take 0.5 tablets (25 mg total) by mouth at bedtime. (Patient taking differently: Take 50 mg by mouth at bedtime.) 45 tablet 0 07/13/2021  . warfarin (COUMADIN) 5 MG tablet Take 1 tablet (5 mg total) by mouth daily. 90 tablet 0 07/13/2021 at 1700  . polyethylene glycol (MIRALAX / GLYCOLAX) 17 g packet Take 17 g by mouth daily as needed for mild constipation. 14 each 0 unk   Scheduled: .  albuterol  2.5 mg Nebulization Once  . diltiazem  120 mg Oral Daily  . metoprolol succinate  50 mg Oral Daily  . pravastatin  80 mg Oral q1800  . tamsulosin  0.4 mg Oral Daily  . traZODone  50 mg Oral QHS   Continuous: . sodium chloride 75 mL/hr at 07/14/21 2211  . cefoTEtan (CEFOTAN) IV    . piperacillin-tazobactam (ZOSYN)  IV 3.375 g (07/15/21 0839)   CZY:[id number] **OR** acetaminophen, albuterol, HYDROmorphone (DILAUDID) injection, ondansetron **OR** ondansetron (ZOFRAN) IV, polyethylene glycol  Allergies  Allergen Reactions  . No Known Allergies      ROS:  A comprehensive review of systems  was negative except for: Gastrointestinal: positive for abdominal pain and nausea  Blood pressure 116/74, pulse 67, temperature 98.6 F (37 C), temperature source Oral, resp. rate (!) 21, height 5\' 10"  (1.778 m), weight 96.7 kg, SpO2 100 %. Physical Exam Vitals reviewed.  Constitutional:      Appearance: He is well-developed.  HENT:     Head: Normocephalic.  Cardiovascular:     Rate and Rhythm: Normal rate.  Pulmonary:     Effort: Pulmonary effort is normal.  Abdominal:     General: There is no distension.     Palpations: Abdomen is soft.     Tenderness: There is abdominal tenderness in the right lower quadrant.     Hernia: No hernia is present.  Skin:    General: Skin is warm.  Neurological:     General: No focal deficit present.     Mental Status: He is alert and oriented to person, place, and time.  Psychiatric:        Mood and Affect: Mood normal.        Behavior: Behavior normal.    Results: Results for orders placed or performed during the hospital encounter of 07/14/21 (from the past 48 hour(s))  Lipase, blood     Status: None   Collection Time: 07/14/21 12:41 PM  Result Value Ref Range   Lipase 44 11 - 51 U/L    Comment: Performed at [facility], [address], [place], [place] [address]  Comprehensive metabolic panel     Status: Abnormal   Collection Time:  07/14/21 12:41 PM  Result Value Ref Range   Sodium 135 135 - 145 mmol/L   Potassium 5.4 (H) 3.5 - 5.1 mmol/L   Chloride 98 98 - 111 mmol/L   CO2 29 22 - 32 mmol/L   Glucose, Bld 108 (H) 70 - 99 mg/dL    Comment: Glucose reference range applies only to samples taken after fasting for at least 8 hours.   BUN 23 8 - 23 mg/dL   Creatinine, Ser 1.33 (H) 0.61 - 1.24 mg/dL   Calcium 9.2 8.9 - 10.3 mg/dL   Total Protein 8.0 6.5 - 8.1 g/dL   Albumin 4.7 3.5 - 5.0 g/dL   AST 29 15 - 41 U/L   ALT 28 0 - 44 U/L   Alkaline Phosphatase 41 38 - 126 U/L   Total Bilirubin 0.5 0.3 - 1.2 mg/dL   GFR, Estimated 57 (L) >60 mL/min    Comment: (NOTE) Calculated using the CKD-EPI Creatinine Equation (2021)    Anion gap 8 5 - 15    Comment: Performed at [facility], [address], [place], [place] [address]  CBC     Status: Abnormal   Collection Time: 07/14/21 12:41 PM  Result Value Ref Range   WBC 14.5 (H) 4.0 - 10.5 K/uL   RBC 6.17 (H) 4.22 - 5.81 MIL/uL   Hemoglobin 16.7 13.0 - 17.0 g/dL   HCT 52.8 (H) 39.0 - 52.0 %   MCV 85.6 80.0 - 100.0 fL   MCH 27.1 26.0 - 34.0 pg   MCHC 31.6 30.0 - 36.0 g/dL   RDW 14.5 11.5 - 15.5 %   Platelets 239 150 - 400 K/uL   nRBC 0.0 0.0 - 0.2 %    Comment: Performed at [facility], [address] [place], [place] [address]  Protime-INR     Status: Abnormal   Collection Time: 07/14/21  4:06 PM  Result Value Ref Range   Prothrombin  Time 25.3 (H) 11.4 - 15.2 seconds   INR 2.3 (H) 0.8 - 1.2    Comment: (NOTE) INR goal varies based on device and disease states. Performed at [facility], [address], [place], [place] [address]   Resp Panel by RT-PCR (Flu A&B, Covid) Nasopharyngeal Swab     Status: None   Collection Time: 07/14/21  6:01 PM   Specimen: Nasopharyngeal Swab; Nasopharyngeal(NP) swabs in vial transport medium  Result Value Ref Range   SARS Coronavirus 2 by RT PCR NEGATIVE NEGATIVE    Comment: (NOTE) SARS-CoV-2 target nucleic acids are NOT  DETECTED.  The SARS-CoV-2 RNA is generally detectable in upper respiratory specimens during the acute phase of infection. The lowest concentration of SARS-CoV-2 viral copies this assay can detect is 138 copies/mL. A negative result does not preclude SARS-Cov-2 infection and should not be used as the sole basis for treatment or other patient management decisions. A negative result may occur with  improper specimen collection/handling, submission of specimen other than nasopharyngeal swab, presence of viral mutation(s) within the areas targeted by this assay, and inadequate number of viral copies(<138 copies/mL). A negative result must be combined with clinical observations, patient history, and epidemiological information. The expected result is Negative.  Fact Sheet for Patients:  EntrepreneurPulse.com.au  Fact Sheet for Healthcare Providers:  IncredibleEmployment.be  This test is no t yet approved or cleared by the [place] FDA and  has been authorized for detection and/or diagnosis of SARS-CoV-2 by FDA under an Emergency Use Authorization (EUA). This EUA will remain  in effect (meaning this test can be used) for the duration of the COVID-19 declaration under Section 564(b)(1) of the Act, 21 U.S.C.section 360bbb-3(b)(1), unless the authorization is terminated  or revoked sooner.       Influenza A by PCR NEGATIVE NEGATIVE   Influenza B by PCR NEGATIVE NEGATIVE    Comment: (NOTE) The Xpert Xpress SARS-CoV-2/FLU/RSV plus assay is intended as an aid in the diagnosis of influenza from Nasopharyngeal swab specimens and should not be used as a sole basis for treatment. Nasal washings and aspirates are unacceptable for Xpert Xpress SARS-CoV-2/FLU/RSV testing.  Fact Sheet for Patients: EntrepreneurPulse.com.au  Fact Sheet for Healthcare Providers: IncredibleEmployment.be  This test is not yet approved or  cleared by the [place] FDA and has been authorized for detection and/or diagnosis of SARS-CoV-2 by FDA under an Emergency Use Authorization (EUA). This EUA will remain in effect (meaning this test can be used) for the duration of the COVID-19 declaration under Section 564(b)(1) of the Act, 21 U.S.C. section 360bbb-3(b)(1), unless the authorization is terminated or revoked.  Performed at [facility], [address], [place], [place] [address]   Surgical pcr screen     Status: None   Collection Time: 07/14/21  9:19 PM   Specimen: Nasal Mucosa; Nasal Swab  Result Value Ref Range   MRSA, PCR NEGATIVE NEGATIVE   Staphylococcus aureus NEGATIVE NEGATIVE    Comment: (NOTE) The Xpert SA Assay (FDA approved for NASAL specimens in patients 62 years of age and older), is one component of a comprehensive surveillance program. It is not intended to diagnose infection nor to guide or monitor treatment. Performed at [facility], [address] [place], [place] [address]   Protime-INR     Status: Abnormal   Collection Time: 07/15/21  5:42 AM  Result Value Ref Range   Prothrombin Time 24.9 (H) 11.4 - 15.2 seconds   INR 2.3 (H) 0.8 - 1.2  Comment: (NOTE) INR goal varies based on device and disease states. Performed at [facility], [address], [place], [place] [address]   Basic metabolic panel     Status: Abnormal   Collection Time: 07/15/21  5:42 AM  Result Value Ref Range   Sodium 137 135 - 145 mmol/L   Potassium 4.0 3.5 - 5.1 mmol/L    Comment: DELTA CHECK NOTED   Chloride 101 98 - 111 mmol/L   CO2 29 22 - 32 mmol/L   Glucose, Bld 102 (H) 70 - 99 mg/dL    Comment: Glucose reference range applies only to samples taken after fasting for at least 8 hours.   BUN 22 8 - 23 mg/dL   Creatinine, Ser 1.30 (H) 0.61 - 1.24 mg/dL   Calcium 8.7 (L) 8.9 - 10.3 mg/dL   GFR, Estimated 59 (L) >60 mL/min    Comment: (NOTE) Calculated using the CKD-EPI Creatinine Equation (2021)    Anion  gap 7 5 - 15    Comment: Performed at [facility], [address], [place], [place] [address]  CBC     Status: None   Collection Time: 07/15/21  5:42 AM  Result Value Ref Range   WBC 8.2 4.0 - 10.5 K/uL   RBC 5.45 4.22 - 5.81 MIL/uL   Hemoglobin 14.4 13.0 - 17.0 g/dL   HCT 47.3 39.0 - 52.0 %   MCV 86.8 80.0 - 100.0 fL   MCH 26.4 26.0 - 34.0 pg   MCHC 30.4 30.0 - 36.0 g/dL   RDW 14.6 11.5 - 15.5 %   Platelets 200 150 - 400 K/uL   nRBC 0.0 0.0 - 0.2 %    Comment: Performed at [facility], [address], [place], [place] [address]  Prepare fresh frozen plasma     Status: None (Preliminary result)   Collection Time: 07/15/21  5:42 AM  Result Value Ref Range   Unit Number [id number]    Blood Component Type THAWED PLASMA    Unit division 00    Status of Unit ALLOCATED    Transfusion Status OK TO TRANSFUSE    Unit Number [id number]    Blood Component Type THAWED PLASMA    Unit division 00    Status of Unit ISSUED    Transfusion Status      OK TO TRANSFUSE Performed at [facility], [address] [place], [place] [address]   ABO/Rh     Status: None   Collection Time: 07/15/21  5:42 AM  Result Value Ref Range   ABO/RH(D)      A POS Performed at [facility], [address], [place], [place] [address]   ABO/Rh     Status: None   Collection Time: 07/15/21  9:24 AM  Result Value Ref Range   ABO/RH(D)      A POS Performed at [facility], [address], [place], [place] [address]    Personally reviewed - dilated appendix with stranding, apppendicolith  CT ABDOMEN PELVIS W CONTRAST  Result Date: 07/14/2021 CLINICAL DATA:  Right lower quadrant abdominal pain, appendicitis suspected EXAM: CT ABDOMEN AND PELVIS WITH CONTRAST TECHNIQUE: Multidetector CT imaging of the abdomen and pelvis was performed using the standard protocol following bolus administration of intravenous contrast. CONTRAST:  138mL OMNIPAQUE IOHEXOL 300 MG/ML  SOLN COMPARISON:  None. FINDINGS: Lower chest: No  acute abnormality.  Emphysema. Hepatobiliary: No solid liver abnormality is seen. No gallstones, gallbladder wall thickening, or biliary dilatation. Pancreas: Unremarkable. No pancreatic ductal dilatation  or surrounding inflammatory changes. Spleen: Normal in size without significant abnormality. Adrenals/Urinary Tract: Adrenal glands are unremarkable. Kidneys are normal, without renal calculi, solid lesion, or hydronephrosis. Bladder is unremarkable. Stomach/Bowel: Stomach is within normal limits. Thickened, fluid-filled appendix measuring up to 1.0 cm in caliber with adjacent fat stranding, containing a small appendicolith near the ostium (series 5, image 61, series 2, image 62). No evidence of bowel wall thickening, distention, or inflammatory changes. Sigmoid diverticula. Vascular/Lymphatic: Aortic atherosclerosis. 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 No enlarged abdominal or pelvic lymph nodes. Reproductive: No mass or other significant abnormality. Other: Small, fat containing bilateral inguinal hernias. No abdominopelvic ascites. Musculoskeletal: No acute or significant osseous findings. IMPRESSION: 1. Thickened, fluid-filled appendix measuring up to 1.0 cm in caliber with adjacent fat stranding, containing a small appendicolith near the ostium. Findings are consistent with acute appendicitis. No evidence of perforation or abscess. 2. 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 Recommend follow-up every 12 months and vascular consultation. This recommendation follows ACR consensus guidelines: White Paper of the ACR Incidental Findings Committee II on Vascular Findings. J Am Coll Radiol 2013; 10:789-794. Aortic Atherosclerosis (ICD10-I70.0). Electronically Signed   By: Delanna Ahmadi M.D.   On: 07/14/2021 16:55     Assessment & Plan:  Randall Sullivan is a 70 y.o.  male with acute appendicitis. He has had a stroke in the past but this was 4 months ago. He is not O2 dependent, and uses this on occasion.  He does have a higher risk of stroke than the baseline population and will get him back on anticoagulation as soon as possible. Using the [employer] he has a 2% risk of Cardiac/ Stroke in the next 30 days with him as an ASA 3 give his COPD, prior cancer, A fib and stroke > 3 months ago.   Discussed the risk of laparoscopic appendectomy and the option of antibiotics alone. Discussed that in [place] and some trials in the [place], antibiotics are used for simple appendicitis. Discussed that research shows a 40% failure rate for antibiotics alone.  Discussed risk of surgery including but not limited to bleeding, infection, injury to other organs, normal appendix, and his increased risk of stroke or cardiovascular event because of his history, but he is 4 months out from this previous stroke, and after this discussion the patient has decided to proceed with surgery.   All questions were answered to the satisfaction of the patient and family.   Virl Cagey 07/15/2021, 10:51 AM

## 2021-07-16 ENCOUNTER — Encounter (HOSPITAL_COMMUNITY): Payer: Self-pay | Admitting: General Surgery

## 2021-07-16 ENCOUNTER — Telehealth: Payer: Self-pay | Admitting: Nurse Practitioner

## 2021-07-16 DIAGNOSIS — K3580 Unspecified acute appendicitis: Secondary | ICD-10-CM | POA: Diagnosis not present

## 2021-07-16 DIAGNOSIS — I10 Essential (primary) hypertension: Secondary | ICD-10-CM | POA: Diagnosis not present

## 2021-07-16 DIAGNOSIS — E1169 Type 2 diabetes mellitus with other specified complication: Secondary | ICD-10-CM | POA: Diagnosis not present

## 2021-07-16 DIAGNOSIS — I7143 Infrarenal abdominal aortic aneurysm, without rupture: Secondary | ICD-10-CM | POA: Diagnosis not present

## 2021-07-16 LAB — BASIC METABOLIC PANEL
Anion gap: 6 (ref 5–15)
BUN: 18 mg/dL (ref 8–23)
CO2: 31 mmol/L (ref 22–32)
Calcium: 8.5 mg/dL — ABNORMAL LOW (ref 8.9–10.3)
Chloride: 99 mmol/L (ref 98–111)
Creatinine, Ser: 1.13 mg/dL (ref 0.61–1.24)
GFR, Estimated: >60 mL/min (ref >60)
Glucose, Bld: 140 mg/dL — ABNORMAL HIGH (ref 70–99)
Potassium: 4.6 mmol/L (ref 3.5–5.1)
Sodium: 136 mmol/L (ref 135–145)

## 2021-07-16 LAB — PREPARE FRESH FROZEN PLASMA
Unit division: 0
Unit division: 0

## 2021-07-16 LAB — CBC
HCT: 42.5 % (ref 39.0–52.0)
Hemoglobin: 13.1 g/dL (ref 13.0–17.0)
MCH: 27.3 pg (ref 26.0–34.0)
MCHC: 30.8 g/dL (ref 30.0–36.0)
MCV: 88.5 fL (ref 80.0–100.0)
Platelets: 186 10*3/uL (ref 150–400)
RBC: 4.8 MIL/uL (ref 4.22–5.81)
RDW: 14.6 % (ref 11.5–15.5)
WBC: 7.9 10*3/uL (ref 4.0–10.5)
nRBC: 0 % (ref 0.0–0.2)

## 2021-07-16 LAB — BPAM FFP
Blood Product Expiration Date: 202210212359
Blood Product Expiration Date: 202210212359
ISSUE DATE / TIME: 202210161008
ISSUE DATE / TIME: 202210161222
Unit Type and Rh: 6200
Unit Type and Rh: 6200

## 2021-07-16 LAB — PROTIME-INR
INR: 1.5 — ABNORMAL HIGH (ref 0.8–1.2)
Prothrombin Time: 18.5 seconds — ABNORMAL HIGH (ref 11.4–15.2)

## 2021-07-16 MED ORDER — WARFARIN SODIUM 7.5 MG PO TABS
7.5000 mg | ORAL_TABLET | Freq: Once | ORAL | Status: AC
  Administered 2021-07-16: 7.5 mg via ORAL
  Filled 2021-07-16: qty 1

## 2021-07-16 MED ORDER — WARFARIN SODIUM 5 MG PO TABS: 5.0000 mg | ORAL_TABLET | Freq: Once | ORAL | Status: DC

## 2021-07-16 MED ORDER — OXYCODONE HCL 5 MG PO TABS: 5.0000 mg | ORAL_TABLET | ORAL | 0 refills | Status: AC | PRN

## 2021-07-16 NOTE — Progress Notes (Addendum)
ANTICOAGULATION CONSULT NOTE -   Pharmacy Consult for Coumadin Indication: atrial fibrillation  Allergies  Allergen Reactions   No Known Allergies     Patient Measurements: Height: 5\' 10"  (177.8 cm) Weight: 96.7 kg (213 lb 3.2 oz) IBW/kg (Calculated) : 73  Vital Signs: Temp: 97.5 F (36.4 C) (10/17 0556) Temp Source: Oral (10/17 0556) BP: 112/59 (10/17 0556) Pulse Rate: 63 (10/17 0556)  Labs: Recent Labs    07/14/21 1241 07/14/21 1606 07/15/21 0542 07/15/21 1149 07/15/21 1304 07/16/21 0634  HGB 16.7  --  14.4  --   --  13.1  HCT 52.8*  --  47.3  --   --  42.5  PLT 239  --  200  --   --  186  LABPROT  --    < > 24.9* 20.3* 18.6* 18.5*  INR  --    < > 2.3* 1.7* 1.6* 1.5*  CREATININE 1.33*  --  1.30*  --   --  1.13   < > = values in this interval not displayed.     Estimated Creatinine Clearance: 70 mL/min (by C-G formula based on SCr of 1.13 mg/dL).   Medical History: Past Medical History:  Diagnosis Date   A-fib ([place]) 2017   after last lung surgery patient had a history if Afib documented in hospital    Allergy    Arthritis    back & legs ( knees)   Cancer ([place])    lung   Complication of anesthesia    agitated upon waking from anesth.    Emphysema    History of lung cancer    Hyperlipidemia    Hypertension    Oxygen dependent    2L continuous   Stroke ([facility])    Tobacco abuse     Medications:  See med rec  Assessment: 70 y.o. male with acute appendicitis. RLQ pain that started yesterday and got significant worse and was associated with dry heaving but no vomiting. Patient is chronically anticoagulated on coumadin for afib. Patient was reversed with 10/15 1815 Vit,K po 2.5mg  and repeated in AM 10/6  Vit k po 2.5mg  . Also, Fresh frozen plasma given. INR reversed to 1.6 and patient underwent lap appendectomy. Surgeon okay to restart coumadin tonight after 9pm  INR 1.5 today, subtherapeutic and will take a few days with vitamin K given to return to  therapeutic range.   Home dose is 5mg  po daily  Goal of Therapy:  INR 2-3 Monitor platelets by anticoagulation protocol: Yes   Plan:  Coumadin 5mg  PO today at 2100 PT-INR daily Monitor for S/S of bleeding  Isac Sarna, BS [place], BCPS Clinical Pharmacist Pager [phone] 07/16/2021,8:35 AM

## 2021-07-16 NOTE — Telephone Encounter (Signed)
Pt. Spouse called in with just a FYI. Pt had an emergency surgery yesterday and is currently admitted at [facility]. If for any reason  you may need pt or info contact wife Randall Sullivan.

## 2021-07-16 NOTE — Discharge Instructions (Signed)
Discharge Laparoscopic Surgery Instructions:  Common Complaints: Right shoulder pain is common after laparoscopic surgery. This is secondary to the gas used in the surgery being trapped under the diaphragm.  Walk to help your body absorb the gas. This will improve in a few days. Pain at the port sites are common, especially the larger port sites. This will improve with time.  Some nausea is common and poor appetite. The main goal is to stay hydrated the first few days after surgery.   Diet/ Activity: Diet as tolerated. You may not have an appetite, but it is important to stay hydrated. Drink 64 ounces of water a day. Your appetite will return with time.  Shower per your regular routine daily.  Do not take hot showers. Take warm showers that are less than 10 minutes. Rest and listen to your body, but do not remain in bed all day.  Walk everyday for at least 15-20 minutes. Deep cough and move around every 1-2 hours in the first few days after surgery.  Do not lift > 10 lbs, perform excessive bending, pushing, pulling, squatting for 1-2 weeks after surgery.  Do not pick at the dermabond glue on your incision sites.  This glue film will remain in place for 1-2 weeks and will start to peel off.  Do not place lotions or balms on your incision unless instructed to specifically by Dr. Selvin Yun.   Pain Expectations and Narcotics: -After surgery you will have pain associated with your incisions and this is normal. The pain is muscular and nerve pain, and will get better with time. -You are encouraged and expected to take non narcotic medications like tylenol and ibuprofen (when able) to treat pain as multiple modalities can aid with pain treatment. -Narcotics are only used when pain is severe or there is breakthrough pain. -You are not expected to have a pain score of 0 after surgery, as we cannot prevent pain. A pain score of 3-4 that allows you to be functional, move, walk, and tolerate some activity is  the goal. The pain will continue to improve over the days after surgery and is dependent on your surgery. -Due to [place] law, we are only able to give a certain amount of pain medication to treat post operative pain, and we only give additional narcotics on a patient by patient basis.  -For most laparoscopic surgery, studies have shown that the majority of patients only need 10-15 narcotic pills, and for open surgeries most patients only need 15-20.   -Having appropriate expectations of pain and knowledge of pain management with non narcotics is important as we do not want anyone to become addicted to narcotic pain medication.  -Using ice packs in the first 48 hours and heating pads after 48 hours, wearing an abdominal binder (when recommended), and using over the counter medications are all ways to help with pain management.   -Simple acts like meditation and mindfulness practices after surgery can also help with pain control and research has proven the benefit of these practices.  Medication: Take tylenol and ibuprofen as needed for pain control, alternating every 4-6 hours.  Example:  Tylenol 1000mg @ 6am, 12noon, 6pm, 12midnight (Do not exceed 4000mg of tylenol a day). Ibuprofen 800mg @ 9am, 3pm, 9pm, 3am (Do not exceed 3600mg of ibuprofen a day).  Take Roxicodone for breakthrough pain every 4 hours.  Take Colace for constipation related to narcotic pain medication. If you do not have a bowel movement in 2 days, take Miralax   over the counter.  Drink plenty of water to also prevent constipation.   Contact Information: If you have questions or concerns, please call our office, [phone], Monday- Thursday 8AM-5PM and Friday 8AM-12Noon.  If it is after hours or on the weekend, please call [facility] Main Number, [phone], [phone], and ask to speak to the surgeon on call for Dr. Modean Mccullum at .   

## 2021-07-16 NOTE — Progress Notes (Addendum)
[facility]  Doing great post op. H&H drifted but related to dilution I suspect. Restarted coumadin for A fib and stroke history 4 months ago. Already ambulating.   BP (!) 112/59 (BP Location: Right Arm)   Pulse 63   Temp (!) 97.5 F (36.4 C) (Oral)   Resp 19   Ht 5\' 10"  (1.778 m)   Wt 96.7 kg   SpO2 98%   BMI 30.59 kg/m  Port sites c/d/I with dermabond, no erythema or drainage  Will do post op phone call 11/1. Continue coumadin post op Gave his wife a work note to be out 10/16-10/23 to be with the patient as he recovers  Curlene Labrum, MD [facility] [address] [place], [place] [phone] (office)

## 2021-07-16 NOTE — Progress Notes (Signed)
ANTICOAGULATION CONSULT NOTE -   Pharmacy Consult for Coumadin Indication: atrial fibrillation  Allergies  Allergen Reactions   No Known Allergies     Patient Measurements: Height: 5\' 10"  (177.8 cm) Weight: 96.7 kg (213 lb 3.2 oz) IBW/kg (Calculated) : 73  Vital Signs: Temp: 97.5 F (36.4 C) (10/17 0556) Temp Source: Oral (10/17 0556) BP: 112/59 (10/17 0556) Pulse Rate: 63 (10/17 0556)  Labs: Recent Labs    07/14/21 1241 07/14/21 1606 07/15/21 0542 07/15/21 1149 07/15/21 1304 07/16/21 0634  HGB 16.7  --  14.4  --   --  13.1  HCT 52.8*  --  47.3  --   --  42.5  PLT 239  --  200  --   --  186  LABPROT  --    < > 24.9* 20.3* 18.6* 18.5*  INR  --    < > 2.3* 1.7* 1.6* 1.5*  CREATININE 1.33*  --  1.30*  --   --  1.13   < > = values in this interval not displayed.     Estimated Creatinine Clearance: 70 mL/min (by C-G formula based on SCr of 1.13 mg/dL).   Medical History: Past Medical History:  Diagnosis Date   A-fib ([place]) 2017   after last lung surgery patient had a history if Afib documented in hospital    Allergy    Arthritis    back & legs ( knees)   Cancer ([place])    lung   Complication of anesthesia    agitated upon waking from anesth.    Emphysema    History of lung cancer    Hyperlipidemia    Hypertension    Oxygen dependent    2L continuous   Stroke ([facility])    Tobacco abuse     Medications:  See med rec  Assessment: 70 y.o. male with acute appendicitis. RLQ pain that started yesterday and got significant worse and was associated with dry heaving but no vomiting. Patient is chronically anticoagulated on coumadin for afib. Patient was reversed with 10/15 1815 Vit,K po 2.5mg  and repeated in AM 10/6  Vit k po 2.5mg  . Also, Fresh frozen plasma given. INR reversed to 1.6 and patient underwent lap appendectomy. Surgeon okay to restart coumadin tonight after 9pm  INR 1.5 today, subtherapeutic and will take a few days with vitamin K given to return to  therapeutic range. Will give slight booster dose  Home dose is 5mg  po daily  Goal of Therapy:  INR 2-3 Monitor platelets by anticoagulation protocol: Yes   Plan:  Coumadin 7.5mg  PO today at 2100 PT-INR daily Monitor for S/S of bleeding  Isac Sarna, BS [place] [place], BCPS Clinical Pharmacist Pager [phone] 07/16/2021,9:44 AM

## 2021-07-16 NOTE — Discharge Summary (Signed)
Physician Discharge Summary [facility]    Patient: Randall Sullivan                   Admit date: 07/14/2021   DOB: 04/12/1951             Discharge date:07/16/2021/11:06 AM TFT:[id number]                          PCP: Noreene Larsson, NP  Disposition:   HOME with Home Health   Recommendations for Outpatient Follow-up:   Follow up: A general surgeon within 1 week. Follow-up with cardiology within 1 -2 weeks Continue current medication, checking INR daily, goal 2-3 Follow instruction per surgery  Discharge Condition: Stable   Code Status:   Code Status: Full Code  Diet recommendation: Cardiac diet   Discharge Diagnoses:    Principal Problem:   Acute appendicitis Active Problems:   Essential (primary) hypertension   Emphysema of lung ([place])   PAF (paroxysmal atrial fibrillation) ([place])   Primary lung adenocarcinoma ([place])   Diabetes mellitus ([place])   Abdominal aortic aneurysm (AAA)   History of Present Illness/ Hospital Course Kathleen Argue Summary:   Kemoni Ortega is a 70 y.o. male with medical history significant for recent CVA, diabetes mellitus, hypertension, paroxysmal atrial fibrillation, lung cancer. Patient presented to the ED with complaints of abdominal pain of 2 months duration-this abdominal pain was on the left right upper and lower abdomen, nonspecific. But yesterday and today pain got worse and was particularly located in the right lower quadrant.  Reports some episodes of dry heaving without actual vomiting.   ED Course: Medically stable T-max 98.3, WBC 14.5, potassium 4.5, INR 2.3   Abdominal CT W contrast suggesting acute appendicitis without perforation or abscess.  Also showed an infrarenal AAA. EDP talked to Dr. Constance Haw, will be seen in the morning, Antibiotics, reverse coumadin. Patient was started on Zosyn.  500 mill bolus given.  2.5 mg oral vitamin K given      Acute appendicitis -Day 1 status post appendectomy, tolerated well -Tolerating p.o.  advancing - status post treatment with 2.3, another 2.5 mg vitamin K will be given along with 2 units of FFP    -Lukocytosis of 14.5.  CT suggesting acute appendicitis without signs of perforation or abscess. -IV Zosyn -started-we will discontinue -IV Dilaudid 0.5mg  as needed - S/P 573mls bolus given, continue N/s 75cc/hr x 15hrs...    Infrarenal Abdominal aortic aneurysm -Currently stable - Infrarenal abdominal aortic aneurysm measuring up to 4.0 x 3.8 cm with a large burden of eccentric mural thrombus. Recommend follow-up every 12 months and vascular consultation. - EDP talked to vascular surgeon- Dr. Carlis Abbott,  no anticoagulation needed, patient to follow-up with vascular as outpatient.   Paroxysmal atrial fibrillation -in Sinus rhythm, -Home medications of warfarin-on hold, INR 2.3 (reversing with vitamin K for surgical intervention) -Resume metoprolol, diltiazem, and Coumadin today, will receive 7.5 mg today 07/16/2021 - INR 1.5 today, recommend to check INR tomorrow and daily till reaches goal 2-3     Non-small cell lung cancer - s/p lower lobectomy with lymph node dissection 2017, and wedge resection of right upper lobe March 2020.  Currently under surveillance.  Follows with Dr. Earlie Server. -Stable     Controlled diabetes mellitus  -CBG every 4 hours with SSI coverage (as long as patient is n.p.o., once tolerating p.o. we will switch to q. ACH S) - A1c 5.6. -Fasting CBGs daily  HTN- -Stable -Resume diltiazem and Cardizem   Emphysema  -Stable  -Resume home bronchodilator regimen   CVA- diagnosed June 2022.  Patient has right-sided deficits not apparent on exam.  Ambulates with or without the cane.  Per daughter is main problem has been memory issues since the stroke. - Resume statins - Anticoag on hold for now-family and patient understands the risk and benefit of holding anticoagulation at this time.   BPH -Resume tamsulosin    Code Status: Full code Family  Communication: Wife at bedside updated Disposition Plan:  -From home, likely be discharged home       Discharge Instructions:   Discharge Instructions     Activity as tolerated - No restrictions   Complete by: As directed    Call MD for:  difficulty breathing, headache or visual disturbances   Complete by: As directed    Call MD for:  persistant dizziness or light-headedness   Complete by: As directed    Call MD for:  persistant nausea and vomiting   Complete by: As directed    Call MD for:  redness, tenderness, or signs of infection (pain, swelling, redness, odor or green/yellow discharge around incision site)   Complete by: As directed    Call MD for:  severe uncontrolled pain   Complete by: As directed    Call MD for:  temperature >100.4   Complete by: As directed    Diet - low sodium heart healthy   Complete by: As directed    Discharge instructions   Complete by: As directed    Follow-up with general surgery within 1 week.  Follow-up with your cardiologist within 2 to 3 weeks, regarding your AAA. Resume recommended, check INR daily with INR goal of 2-3 Advance your diet very slowly   Increase activity slowly   Complete by: As directed    No wound care   Complete by: As directed    Per surgery wound care instructions        Medication List     TAKE these medications    acetaminophen 325 MG tablet Commonly known as: TYLENOL Take 2 tablets (650 mg total) by mouth every 6 (six) hours as needed for mild pain (or Fever >/= 101).   albuterol (2.5 MG/3ML) 0.083% nebulizer solution Commonly known as: PROVENTIL Take 3 mLs (2.5 mg total) by nebulization every 6 (six) hours as needed for wheezing or shortness of breath.   diltiazem 120 MG 24 hr capsule Commonly known as: CARDIZEM CD Take 1 capsule (120 mg total) by mouth daily.   fenofibrate 145 MG tablet Commonly known as: TRICOR Take 1 tablet (145 mg total) by mouth daily.   loratadine 10 MG tablet Commonly  known as: CLARITIN Take 1 tablet (10 mg total) by mouth daily.   meclizine 25 MG tablet Commonly known as: ANTIVERT Take 1 tablet (25 mg total) by mouth 3 (three) times daily as needed for dizziness.   metoprolol succinate 50 MG 24 hr tablet Commonly known as: TOPROL-XL Take 1 tablet (50 mg total) by mouth daily. Take with or immediately following a meal.   oxyCODONE 5 MG immediate release tablet Commonly known as: Oxy IR/ROXICODONE Take 1-2 tablets (5-10 mg total) by mouth every 4 (four) hours as needed for up to 3 days for moderate pain or severe pain.   OXYGEN Inhale 2-3 L into the lungs continuous. Hx of lung ca, copd, and emphysema   polyethylene glycol 17 g packet Commonly known as: MIRALAX /  GLYCOLAX Take 17 g by mouth daily as needed for mild constipation.   pravastatin 80 MG tablet Commonly known as: PRAVACHOL Take 1 tablet (80 mg total) by mouth daily.   tamsulosin 0.4 MG Caps capsule Commonly known as: FLOMAX Take 1 capsule (0.4 mg total) by mouth daily.   traZODone 50 MG tablet Commonly known as: DESYREL Take 0.5 tablets (25 mg total) by mouth at bedtime. What changed: how much to take   Vitamin D3 125 MCG (5000 UT) Caps Take 1 capsule (5,000 Units total) by mouth daily.   warfarin 5 MG tablet Commonly known as: Coumadin Take as directed. If you are unsure how to take this medication, talk to your nurse or doctor. Original instructions: Take 1 tablet (5 mg total) by mouth daily.        Follow-up Information     [facility]   Specialty: Home Health Services Why: Will contact you to schedule home health visits.        Virl Cagey, MD Follow up on 07/31/2021.   Specialty: General Surgery Why: post op phone call, if you need to be seen in person call the office Contact information: [address] [place]na [place] [facility] [address] [phone]                Allergies  Allergen Reactions   No Known Allergies       Procedures /Studies:   CT ABDOMEN PELVIS W CONTRAST  Result Date: 07/14/2021 CLINICAL DATA:  Right lower quadrant abdominal pain, appendicitis suspected EXAM: CT ABDOMEN AND PELVIS WITH CONTRAST TECHNIQUE: Multidetector CT imaging of the abdomen and pelvis was performed using the standard protocol following bolus administration of intravenous contrast. CONTRAST:  193mL OMNIPAQUE IOHEXOL 300 MG/ML  SOLN COMPARISON:  None. FINDINGS: Lower chest: No acute abnormality.  Emphysema. Hepatobiliary: No solid liver abnormality is seen. No gallstones, gallbladder wall thickening, or biliary dilatation. Pancreas: Unremarkable. No pancreatic ductal dilatation or surrounding inflammatory changes. Spleen: Normal in size without significant abnormality. Adrenals/Urinary Tract: Adrenal glands are unremarkable. Kidneys are normal, without renal calculi, solid lesion, or hydronephrosis. Bladder is unremarkable. Stomach/Bowel: Stomach is within normal limits. Thickened, fluid-filled appendix measuring up to 1.0 cm in caliber with adjacent fat stranding, containing a small appendicolith near the ostium (series 5, image 61, series 2, image 62). No evidence of bowel wall thickening, distention, or inflammatory changes. Sigmoid diverticula. Vascular/Lymphatic: Aortic atherosclerosis. 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 No enlarged abdominal or pelvic lymph nodes. Reproductive: No mass or other significant abnormality. Other: Small, fat containing bilateral inguinal hernias. No abdominopelvic ascites. Musculoskeletal: No acute or significant osseous findings. IMPRESSION: 1. Thickened, fluid-filled appendix measuring up to 1.0 cm in caliber with adjacent fat stranding, containing a small appendicolith near the ostium. Findings are consistent with acute appendicitis. No evidence of perforation or abscess. 2. Infrarenal abdominal aortic aneurysm  measuring up to 4.0 x 3.8 cm with a large burden of eccentric mural thrombus. Recommend follow-up every 12 months and vascular consultation. This recommendation follows ACR consensus guidelines: White Paper of the ACR Incidental Findings Committee II on Vascular Findings. J Am Coll Radiol 2013; 10:789-794. Aortic Atherosclerosis (ICD10-I70.0). Electronically Signed   By: Delanna Ahmadi M.D.   On: 07/14/2021 16:55    Subjective:   Patient was seen and examined 07/16/2021, 11:06 AM Patient stable today. No acute distress.  No issues overnight Stable for discharge.  Discharge Exam:  Vitals:   07/15/21 2104 07/16/21 0128 07/16/21 0442 07/16/21 0556  BP: 140/69 (!) 141/66  (!) 112/59  Pulse: 71 67  63  Resp:  18  19  Temp:  (!) 97.5 F (36.4 C)  (!) 97.5 F (36.4 C)  TempSrc:  Oral  Oral  SpO2:  97% 96% 98%  Weight:      Height:        General: Pt lying comfortably in bed & appears in no obvious distress. Cardiovascular: S1 & S2 heard, RRR, S1/S2 +. No murmurs, rubs, gallops or clicks. No JVD or pedal edema. Respiratory: Clear to auscultation without wheezing, rhonchi or crackles. No increased work of breathing. Abdominal:  Non-distended, non-tender & soft. No organomegaly or masses appreciated. Normal bowel sounds heard. CNS: Alert and oriented. No focal deficits. Extremities: no edema, no cyanosis      The results of significant diagnostics from this hospitalization (including imaging, microbiology, ancillary and laboratory) are listed below for reference.      Microbiology:   Recent Results (from the past 240 hour(s))  Resp Panel by RT-PCR (Flu A&B, Covid) Nasopharyngeal Swab     Status: None   Collection Time: 07/14/21  6:01 PM   Specimen: Nasopharyngeal Swab; Nasopharyngeal(NP) swabs in vial transport medium  Result Value Ref Range Status   SARS Coronavirus 2 by RT PCR NEGATIVE NEGATIVE Final    Comment: (NOTE) SARS-CoV-2 target nucleic acids are NOT DETECTED.  The  SARS-CoV-2 RNA is generally detectable in upper respiratory specimens during the acute phase of infection. The lowest concentration of SARS-CoV-2 viral copies this assay can detect is 138 copies/mL. A negative result does not preclude SARS-Cov-2 infection and should not be used as the sole basis for treatment or other patient management decisions. A negative result may occur with  improper specimen collection/handling, submission of specimen other than nasopharyngeal swab, presence of viral mutation(s) within the areas targeted by this assay, and inadequate number of viral copies(<138 copies/mL). A negative result must be combined with clinical observations, patient history, and epidemiological information. The expected result is Negative.  Fact Sheet for Patients:  EntrepreneurPulse.com.au  Fact Sheet for Healthcare Providers:  IncredibleEmployment.be  This test is no t yet approved or cleared by the [place] FDA and  has been authorized for detection and/or diagnosis of SARS-CoV-2 by FDA under an Emergency Use Authorization (EUA). This EUA will remain  in effect (meaning this test can be used) for the duration of the COVID-19 declaration under Section 564(b)(1) of the Act, 21 U.S.C.section 360bbb-3(b)(1), unless the authorization is terminated  or revoked sooner.       Influenza A by PCR NEGATIVE NEGATIVE Final   Influenza B by PCR NEGATIVE NEGATIVE Final    Comment: (NOTE) The Xpert Xpress SARS-CoV-2/FLU/RSV plus assay is intended as an aid in the diagnosis of influenza from Nasopharyngeal swab specimens and should not be used as a sole basis for treatment. Nasal washings and aspirates are unacceptable for Xpert Xpress SARS-CoV-2/FLU/RSV testing.  Fact Sheet for Patients: EntrepreneurPulse.com.au  Fact Sheet for Healthcare Providers: IncredibleEmployment.be  This test is not yet approved or  cleared by the [place] FDA and has been authorized for detection and/or diagnosis of SARS-CoV-2 by FDA under an Emergency Use Authorization (EUA). This EUA will remain in effect (meaning this test can be used) for the duration of the COVID-19 declaration under Section 564(b)(1) of the Act, 21 U.S.C. section 360bbb-3(b)(1), unless the authorization is terminated or revoked.  Performed at [facility]  [facility], [address] [place], [place] [address]   Surgical pcr screen     Status: None   Collection Time: 07/14/21  9:19 PM   Specimen: Nasal Mucosa; Nasal Swab  Result Value Ref Range Status   MRSA, PCR NEGATIVE NEGATIVE Final   Staphylococcus aureus NEGATIVE NEGATIVE Final    Comment: (NOTE) The Xpert SA Assay (FDA approved for NASAL specimens in patients 6 years of age and older), is one component of a comprehensive surveillance program. It is not intended to diagnose infection nor to guide or monitor treatment. Performed at [facility], [address] [place], [place] [address]      Labs:   CBC: Recent Labs  Lab 07/14/21 1241 07/15/21 0542 07/16/21 0634  WBC 14.5* 8.2 7.9  HGB 16.7 14.4 13.1  HCT 52.8* 47.3 42.5  MCV 85.6 86.8 88.5  PLT 239 200 330   Basic Metabolic Panel: Recent Labs  Lab 07/14/21 1241 07/15/21 0542 07/16/21 0634  NA 135 137 136  K 5.4* 4.0 4.6  CL 98 101 99  CO2 29 29 31   GLUCOSE 108* 102* 140*  BUN 23 22 18   CREATININE 1.33* 1.30* 1.13  CALCIUM 9.2 8.7* 8.5*   Liver Function Tests: Recent Labs  Lab 07/14/21 1241  AST 29  ALT 28  ALKPHOS 41  BILITOT 0.5  PROT 8.0  ALBUMIN 4.7   BNP (last 3 results) Recent Labs    03/12/21 0215  BNP 40.0      Component Value Date/Time   COLORURINE YELLOW 03/12/2021 0620   [place] 03/12/2021 0620   LABSPEC 1.019 03/12/2021 0620   PHURINE 5.0 03/12/2021 0620   GLUCOSEU 50 (A) 03/12/2021 0620   HGBUR NEGATIVE 03/12/2021 0620   BILIRUBINUR NEGATIVE 03/12/2021 0620   KETONESUR  NEGATIVE 03/12/2021 0620   PROTEINUR NEGATIVE 03/12/2021 0620   UROBILINOGEN 0.2 10/04/2011 1026   NITRITE NEGATIVE 03/12/2021 0620   LEUKOCYTESUR NEGATIVE 03/12/2021 0620         Time coordinating discharge: Over 45 minutes  SIGNED: Deatra James, MD, FACP, FHM. [facility],  Please use [email or web address] to Page If 7PM-7AM, please contact night-coverage [email or web address], Password [place] 07/16/2021, 11:06 AM

## 2021-07-16 NOTE — TOC Transition Note (Signed)
Transition of Care [facility]) - CM/SW Discharge Note   Patient Details  Name: Randall Sullivan MRN: [id number] Date of Birth: [birth year]  [facility]) CM/SW Contact:  Salome Arnt, [place] Phone Number: 07/16/2021, 10:29 AM   Clinical Narrative:   Pt d/c today per MD. Discussed home health with pt's wife who is agreeable and requests [facility] as they have had [facility] in the past. Referred to [facility] with [facility] for PT/RN. Referral accepted. No DME needed per wife.     Final next level of care: Home w Home Health Services Barriers to Discharge: Barriers Resolved   Patient Goals and CMS Choice Patient states their goals for this hospitalization and ongoing recovery are:: return home   Choice offered to / list presented to : Spouse  Discharge Placement                  Name of family member notified: wife Patient and family notified of of transfer: 07/16/21  Discharge Plan and Services                DME Arranged: N/A DME Agency: NA       HH Arranged: RN, PT [place] Agency: [place] ([place]) Date [place]: 07/16/21 Time [place]: [place] Representative spoke with at [place]: [place] Determinants of Health ([place]) Interventions     Readmission Risk Interventions Readmission Risk Prevention Plan 03/14/2021 12/27/2018 12/27/2018  Post Dischage Appt Complete - -  Medication Screening Complete - -  Transportation Screening Complete - Complete  PCP or Specialist Appt within 5-7 Days - - Complete  Home Care Screening - Complete -  Medication Review (RN CM) - Complete -  Some recent data might be hidden

## 2021-07-16 NOTE — Progress Notes (Signed)
Patient states understanding of discharge instructions.  

## 2021-07-17 ENCOUNTER — Other Ambulatory Visit: Payer: Self-pay

## 2021-07-17 ENCOUNTER — Telehealth: Payer: Self-pay

## 2021-07-17 DIAGNOSIS — I69351 Hemiplegia and hemiparesis following cerebral infarction affecting right dominant side: Secondary | ICD-10-CM | POA: Diagnosis not present

## 2021-07-17 DIAGNOSIS — C3491 Malignant neoplasm of unspecified part of right bronchus or lung: Secondary | ICD-10-CM | POA: Diagnosis not present

## 2021-07-17 DIAGNOSIS — I10 Essential (primary) hypertension: Secondary | ICD-10-CM | POA: Diagnosis not present

## 2021-07-17 DIAGNOSIS — Z9049 Acquired absence of other specified parts of digestive tract: Secondary | ICD-10-CM | POA: Diagnosis not present

## 2021-07-17 DIAGNOSIS — Z7901 Long term (current) use of anticoagulants: Secondary | ICD-10-CM | POA: Diagnosis not present

## 2021-07-17 DIAGNOSIS — I480 Paroxysmal atrial fibrillation: Secondary | ICD-10-CM | POA: Diagnosis not present

## 2021-07-17 DIAGNOSIS — Z9981 Dependence on supplemental oxygen: Secondary | ICD-10-CM | POA: Diagnosis not present

## 2021-07-17 DIAGNOSIS — Z48815 Encounter for surgical aftercare following surgery on the digestive system: Secondary | ICD-10-CM | POA: Diagnosis not present

## 2021-07-17 DIAGNOSIS — I7143 Infrarenal abdominal aortic aneurysm, without rupture: Secondary | ICD-10-CM | POA: Diagnosis not present

## 2021-07-17 DIAGNOSIS — Z87891 Personal history of nicotine dependence: Secondary | ICD-10-CM | POA: Diagnosis not present

## 2021-07-17 DIAGNOSIS — E119 Type 2 diabetes mellitus without complications: Secondary | ICD-10-CM | POA: Diagnosis not present

## 2021-07-17 DIAGNOSIS — Z79891 Long term (current) use of opiate analgesic: Secondary | ICD-10-CM | POA: Diagnosis not present

## 2021-07-17 DIAGNOSIS — J439 Emphysema, unspecified: Secondary | ICD-10-CM | POA: Diagnosis not present

## 2021-07-17 DIAGNOSIS — I69311 Memory deficit following cerebral infarction: Secondary | ICD-10-CM | POA: Diagnosis not present

## 2021-07-17 DIAGNOSIS — N400 Enlarged prostate without lower urinary tract symptoms: Secondary | ICD-10-CM | POA: Diagnosis not present

## 2021-07-17 LAB — SURGICAL PATHOLOGY

## 2021-07-17 NOTE — Telephone Encounter (Signed)
Randall Sullivan called from [facility]  Had emergency surgery and just discharged 10.17.2022 and Randall Sullivan from [place] says his incision looks great.  Needs order for 1xweek for 3 weeks   /    2 prn.  Also needs PT order as well.   Call back # [phone].

## 2021-07-17 NOTE — Telephone Encounter (Signed)
Gave verbal ok for orders

## 2021-07-18 ENCOUNTER — Telehealth: Payer: Self-pay | Admitting: Nurse Practitioner

## 2021-07-18 ENCOUNTER — Other Ambulatory Visit: Payer: Self-pay | Admitting: Internal Medicine

## 2021-07-18 DIAGNOSIS — J439 Emphysema, unspecified: Secondary | ICD-10-CM | POA: Diagnosis not present

## 2021-07-18 DIAGNOSIS — Z48815 Encounter for surgical aftercare following surgery on the digestive system: Secondary | ICD-10-CM | POA: Diagnosis not present

## 2021-07-18 DIAGNOSIS — C3491 Malignant neoplasm of unspecified part of right bronchus or lung: Secondary | ICD-10-CM | POA: Diagnosis not present

## 2021-07-18 DIAGNOSIS — Z9109 Other allergy status, other than to drugs and biological substances: Secondary | ICD-10-CM

## 2021-07-18 DIAGNOSIS — I69351 Hemiplegia and hemiparesis following cerebral infarction affecting right dominant side: Secondary | ICD-10-CM | POA: Diagnosis not present

## 2021-07-18 DIAGNOSIS — I480 Paroxysmal atrial fibrillation: Secondary | ICD-10-CM | POA: Diagnosis not present

## 2021-07-18 DIAGNOSIS — I69311 Memory deficit following cerebral infarction: Secondary | ICD-10-CM | POA: Diagnosis not present

## 2021-07-18 NOTE — Chronic Care Management (AMB) (Signed)
  Chronic Care Management   Note  07/18/2021 Name: Randall Sullivan MRN: [id number] DOB: 12/24/1950  Randall Sullivan is a 70 y.o. year old male who is a primary care patient of Noreene Larsson, NP. I reached out to Joellyn Haff by phone today in response to a referral sent by Randall Sullivan PCP.  Randall Sullivan was given information about Chronic Care Management services today including:  CCM service includes personalized support from designated clinical staff supervised by his physician, including individualized plan of care and coordination with other care providers 24/7 contact phone numbers for assistance for urgent and routine care needs. Service will only be billed when office clinical staff spend 20 minutes or more in a month to coordinate care. Only one practitioner may furnish and bill the service in a calendar month. The patient may stop CCM services at any time (effective at the end of the month) by phone call to the office staff. The patient is responsible for co-pay (up to 20% after annual deductible is met) if co-pay is required by the individual health plan.   Randall Sullivan spouse DPR on file verbally agreed to assistance and services provided by embedded care coordination/care management team today.  Follow up plan: Telephone appointment with care management team member scheduled for:07/19/21  [place] Management  Direct Dial: [phone]

## 2021-07-18 NOTE — Chronic Care Management (AMB) (Signed)
  Chronic Care Management   Outreach Note  07/18/2021 Name: Randall Sullivan MRN: [id number] DOB: January 23, 1951  Randall Sullivan is a 70 y.o. year old male who is a primary care patient of Noreene Larsson, NP. I reached out to Randall Sullivan by phone today in response to a referral sent by Randall Sullivan primary care provider.  A second unsuccessful telephone outreach was attempted today. The patient was referred to the case management team for assistance with care management and care coordination.   Follow Up Plan: A HIPAA compliant phone message was left for the patient providing contact information and requesting a return call. The care management team will reach out to the patient again over the next 7 days. If patient returns call to provider office, please advise to call [place] at [phone].  [place] Management  Direct Dial: [phone]

## 2021-07-18 NOTE — Telephone Encounter (Signed)
Annie [facility] with [facility] called in on patient behalf for verbal PT orders   1 wk 1  2 w 3  1 w 3

## 2021-07-18 NOTE — Telephone Encounter (Signed)
Verbal order given  

## 2021-07-19 ENCOUNTER — Ambulatory Visit (INDEPENDENT_AMBULATORY_CARE_PROVIDER_SITE_OTHER): Payer: Medicare Other | Admitting: Pharmacist

## 2021-07-19 DIAGNOSIS — E785 Hyperlipidemia, unspecified: Secondary | ICD-10-CM

## 2021-07-19 DIAGNOSIS — I639 Cerebral infarction, unspecified: Secondary | ICD-10-CM

## 2021-07-19 DIAGNOSIS — I10 Essential (primary) hypertension: Secondary | ICD-10-CM

## 2021-07-19 DIAGNOSIS — I480 Paroxysmal atrial fibrillation: Secondary | ICD-10-CM

## 2021-07-19 DIAGNOSIS — E1169 Type 2 diabetes mellitus with other specified complication: Secondary | ICD-10-CM

## 2021-07-19 NOTE — Patient Instructions (Signed)
Randall Sullivan,  It was great to talk to you today!  Please call me with any questions or concerns.   Based on our conversation today, please contact the local social security office to discuss applying for Medicare Extra Help (also known as low income subsidy).    Social Security Office [address] [place], [place] [address] [phone]   ============================================================================================================= It can be hard choosing a Medicare plan. A group that I always recommend for my patients is called [employer]). With this program, they offer free counseling to Medicare beneficiaries and caregivers about Medicare, Medicare supplements, Medicare Advantage, Medicare Part D, and long-term care insurance. [place] counselors are not [employer], and they do not sell or endorse any product, plan, or company. The counselors are volunteers and they always offer unbiased information regarding Medicare health care products.  [facility] has counselors in every county across the state who are trained to be the [place] people for seniors and Medicare beneficiaries in their local communities. Local counseling is done by appointment in each county.   [facility] local counseling team's information can be found below. I recommend that you give them a call and set up an appointment (Ask for [place]).    [facility]     [address] [place]  [address] [phone]   For more information: [email or web address].no or just google "[place] [facility]"   Visit Information   PATIENT GOALS:   Goals Addressed             This Visit's Progress    Medication Management       Patient Goals/Self-Care  Activities Patient will:  Take medications as prescribed Check blood pressure at least once daily, document, and provide at future appointments Collaborate with provider on medication access solutions        Consent to CCM Services: Randall Sullivan was given information about Chronic Care Management services including:  CCM service includes personalized support from designated clinical staff supervised by his physician, including individualized plan of care and coordination with other care providers 24/7 contact phone numbers for assistance for urgent and routine care needs. Service will only be billed when office clinical staff spend 20 minutes or more in a month to coordinate care. Only one practitioner may furnish and bill the service in a calendar month. The patient may stop CCM services at any time (effective at the end of the month) by phone call to the office staff. The patient will be responsible for cost sharing (co-pay) of up to 20% of the service fee (after annual deductible is met).  Patient agreed to services and verbal consent obtained.   Patient verbalizes understanding of instructions provided today and agrees to view in [place].   The patient has been provided with contact information for the care management team and has been advised to call with any health related questions or concerns.   Kennon Holter, PharmD Clinical Pharmacist [facility] [phone]  CLINICAL CARE PLAN: Patient Care Plan: Medication Management     Problem Identified: HLD/CVA, HTN, T2DM, Afib   Priority: High  Onset Date: 07/19/2021     Goal: Disease Progression Prevention   Start Date: 07/19/2021  Expected End Date: 10/17/2021  This Visit's Progress: On track  Priority: High  Note:   Current Barriers:  Unable to independently afford treatment regimen Unable to achieve control of hyperlipidemia Suboptimal therapeutic regimen for hyperlipidemia  Pharmacist Clinical  Goal(s):  Patient will verbalize ability to afford treatment regimen achieve control of hyperlipidemia as evidenced by improved LDL and improved triglycerides through collaboration with PharmD and provider.   Interventions: 1:1 collaboration with Noreene Larsson, NP regarding development and update of comprehensive plan of care as evidenced by provider attestation and co-signature Inter-disciplinary care team collaboration (see longitudinal plan of care) Comprehensive medication review performed; medication list updated in electronic medical record  Type 2 Diabetes - New goal.: Controlled; Most recent A1c at goal of <7% per [name] guidelines Current medications:  none, PCP recently discontinued glimepiride Intolerances: none Taking medications as directed: n/a Side effects thought to be attributed to current medication regimen: n/a Hypoglycemia prevention: not indicated at this time Current meal patterns: not discussed today Current exercise: not discussed today On a statin: yes On aspirin 81 mg daily: no, since he is on blood thinner for Afib Last microalbumin: 6.3; on an ACEi/ARB: no Last eye exam: overdue Last foot exam: overdue Pneumonia vaccine:  PPSV23 in June 2022; if never received PCV then recommend PCV20 single dose Influenza vaccine:  needs this fall Current glucose readings:  not discussed today Continue to monitor blood glucose and A1c; could consider GLP-1 or SGLT-2 inhibitor due to ASCVD risk independent of A1c; however, cost is prohibitive at this time  Hypertension - New goal.: Blood pressure under good control. Blood pressure is at goal of <130/80 mmHg per 2017 AHA/ACC guidelines. Current medications: diltiazem 120 mg by mouth once daily and metoprolol succinate 50 mg by mouth once daily Intolerances: none Taking medications as directed: yes Side effects thought to be attributed to current medication regimen: no Current home blood pressure: not discussed  today Continue diltiazem 120 mg by mouth once daily and metoprolol succinate 50 mg by mouth once daily Encourage dietary sodium restriction/DASH diet Recommend home blood pressure monitoring to discuss at next visit Discussed need for medication compliance  Hyperlipidemia/history of stroke (03/12/21) - New goal.: Uncontrolled. LDL above goal of <70 due to very high risk given established clinical ASCVD per 2020 AACE/ACE guidelines. Triglycerides above goal of <150 per 2020 AACE/ACE guidelines. Current medications: pravastatin 80 mg by mouth once daily (recently increased from 40 mg by mouth daily) Intolerances: none Taking medications as directed: yes Side effects thought to be attributed to current medication regimen: no Encourage dietary reduction of high fat containing foods such as butter, nuts, bacon, egg yolks, etc. Discussed need for medication compliance Re-check lipid panel in 4-12 weeks Given clinical ASCVD, would recommend high intensity statin such as rosuvastatin 20 or 40 mg by mouth daily or atorvastatin 40 or 80 mg by mouth daily for secondary prevention. If LDL remains above goal on high intensity statin, would recommend initiation of ezetimibe 10 mg by mouth daily.  For elevated triglycerides, continue fibrate and statin therapy. Consider omega-3 therapy next. Vascepa would be preferred; however, this is cost prohibitive. Could have patient take over the counter fish oil 1-2 of EPA twice daily.   Atrial Fibrillation - New goal.: Followed by Dr. Percival Spanish Controlled. Most recent ECG: normal sinus. Current rate control: metoprolol succinate 50 mg by mouth once daily and diltiazem 120 mg by mouth once daily Anticoagulation: warfarin (Coumadin) 5 mg by mouth daily managed by [facility] in [place] CHADS2VASc score: 5 - Age (1 point), Hypertension history (1 point), Stroke/TIA/Thromboembolism history (2 points), and Diabetes history (1 point) Denies signs and symptoms of  bleeding Discussed need for medication compliance Continue current management per cardiology  Financial constraints Patient  reports cost concerns for his medications. Medication assistance evaluation completed; however, patient is on all generic medications and there are no applicable assistance programs through the manufacturer. The patient does not currently have Medicare part D. Based on current household size and income, patient should qualify for Medicare Extra Help. Patient instructed to call the local social security office to apply for Medicare Extra Help. Of note, the application for Extra Help does not enroll the patient in a Medicare Prescription Drug Plan. He does not need to be enrolled in a Medicare Prescription Drug Plan to file for Extra Help. However, the Extra Help assistance does not start until he is enrolled with an approved Medicare prescription drug provider. Patient also given information for local [facility] office to consider enrolling in a Medicare Prescription Drug Plan.  Patient Goals/Self-Care Activities Patient will:  Take medications as prescribed Check blood pressure at least once daily, document, and provide at future appointments Collaborate with provider on medication access solutions  Follow Up Plan: The patient has been provided with contact information for the care management team and has been advised to call with any health related questions or concerns.

## 2021-07-19 NOTE — Chronic Care Management (AMB) (Signed)
Chronic Care Management Pharmacy Note  07/19/2021 Name:  Randall Sullivan MRN:  [id number] DOB:  1951/01/13  Summary:  Type 2 Diabetes Continue to monitor blood glucose and A1c; could consider GLP-1 or SGLT-2 inhibitor due to ASCVD risk independent of A1c; however, cost is prohibitive at this time  Hyperlipidemia/history of stroke (03/12/21) Given clinical ASCVD, would recommend high intensity statin such as rosuvastatin 20 or 40 mg by mouth daily or atorvastatin 40 or 80 mg by mouth daily for secondary prevention. If LDL remains above goal on high intensity statin, would recommend initiation of ezetimibe 10 mg by mouth daily.  For elevated triglycerides, continue fibrate and statin therapy. Consider omega-3 therapy next. Vascepa would be preferred; however, this is cost prohibitive. Could have patient take over the counter fish oil 1-2 of EPA twice daily.   Financial constraints Patient reports cost concerns for his medications. Medication assistance evaluation completed; however, patient is on all generic medications and there are no applicable assistance programs through the manufacturer. The patient does not currently have Medicare part D. Based on current household size and income, patient should qualify for Medicare Extra Help. Patient instructed to call the local social security office to apply for Medicare Extra Help. Of note, the application for Extra Help does not enroll the patient in a Medicare Prescription Drug Plan. He does not need to be enrolled in a Medicare Prescription Drug Plan to file for Extra Help. However, the Extra Help assistance does not start until he is enrolled with an approved Medicare prescription drug provider. Patient also given information for local [facility] [place] [facility] office to consider enrolling in a Medicare Prescription Drug Plan.  Subjective: Randall Sullivan is an 70 y.o. year old male who is a primary patient of Heather Roberts, NP.  The CCM team was consulted for assistance with  disease management and care coordination needs.    Engaged with patient and wife Randall Sullivan) by telephone for initial visit in response to provider referral for pharmacy case management and/or care coordination services.   Consent to Services:  The patient was given the following information about Chronic Care Management services today, agreed to services, and gave verbal consent: 1. CCM service includes personalized support from designated clinical staff supervised by the primary care provider, including individualized plan of care and coordination with other care providers 2. 24/7 contact phone numbers for assistance for urgent and routine care needs. 3. Service will only be billed when office clinical staff spend 20 minutes or more in a month to coordinate care. 4. Only one practitioner may furnish and bill the service in a calendar month. 5.The patient may stop CCM services at any time (effective at the end of the month) by phone call to the office staff. 6. The patient will be responsible for cost sharing (co-pay) of up to 20% of the service fee (after annual deductible is met). Patient agreed to services and consent obtained.  Patient Care Team: Heather Roberts, NP as PCP - General (Nurse Practitioner) Gavin Pound, [facility] (Pharmacist)  Objective:  Lab Results  Component Value Date   CREATININE 1.13 07/16/2021   CREATININE 1.30 (H) 07/15/2021   CREATININE 1.33 (H) 07/14/2021    Lab Results  Component Value Date   HGBA1C 5.6 07/05/2021   Last diabetic Eye exam: No results found for: HMDIABEYEEXA  Last diabetic Foot exam: No results found for: HMDIABFOOTEX      Component Value Date/Time   CHOL 270 (H) 07/05/2021 0957   TRIG  465 (H) 07/05/2021 0957   HDL 33 (L) 07/05/2021 0957   CHOLHDL 8.3 03/12/2021 1725   VLDL UNABLE TO CALCULATE IF TRIGLYCERIDE OVER 400 mg/dL 03/12/2021 1725   LDLCALC 149 (H) 07/05/2021 0957   LDLCALC  05/08/2020 1051     Comment:     . LDL cholesterol  not calculated. Triglyceride levels greater than 400 mg/dL invalidate calculated LDL results. . Reference range: <100 . Desirable range <100 mg/dL for primary prevention;   <70 mg/dL for patients with CHD or diabetic patients  with > or = 2 CHD risk factors. [name] LDL-C is now calculated using the Martin-Hopkins  calculation, which is a validated novel method providing  better accuracy than the Friedewald equation in the  estimation of LDL-C.  Cresenciano Genre et al. Annamaria Helling. 2703;500(93): 2061-2068  (http://education.[employer].com/faq/FAQ164)    LDLDIRECT 75.9 03/12/2021 1725    Hepatic Function Latest Ref Rng & Units 07/14/2021 07/05/2021 06/05/2021  Total Protein 6.5 - 8.1 g/dL 8.0 7.7 8.0  Albumin 3.5 - 5.0 g/dL 4.7 4.8(H) 4.5  AST 15 - 41 U/L $Remo'29 26 24  'VuHwd$ ALT 0 - 44 U/L 28 29 33  Alk Phosphatase 38 - 126 U/L 41 48 39  Total Bilirubin 0.3 - 1.2 mg/dL 0.5 0.5 0.6    Lab Results  Component Value Date/Time   TSH 1.590 07/05/2021 09:57 AM   TSH 0.82 05/08/2020 10:51 AM    CBC Latest Ref Rng & Units 07/16/2021 07/15/2021 07/14/2021  WBC 4.0 - 10.5 K/uL 7.9 8.2 14.5(H)  Hemoglobin 13.0 - 17.0 g/dL 13.1 14.4 16.7  Hematocrit 39.0 - 52.0 % 42.5 47.3 52.8(H)  Platelets 150 - 400 K/uL 186 200 239    Lab Results  Component Value Date/Time   VD25OH 50.6 07/05/2021 09:57 AM   VD25OH 11 (L) 05/08/2020 10:51 AM    Clinical ASCVD: Yes  The ASCVD Risk score (Arnett DK, et al., 2019) failed to calculate for the following reasons:   The patient has a prior MI or stroke diagnosis    Social History   Tobacco Use  Smoking Status Former   Packs/day: 1.00   Years: 40.00   Pack years: 40.00   Types: Cigarettes   Quit date: 05/28/2016   Years since quitting: 5.1  Smokeless Tobacco Never   BP Readings from Last 3 Encounters:  07/16/21 (!) 112/59  07/09/21 (!) 151/83  06/25/21 (!) 158/86   Pulse Readings from Last 3 Encounters:  07/16/21 63  07/09/21 60  06/25/21 79   Wt Readings  from Last 3 Encounters:  07/14/21 213 lb 3.2 oz (96.7 kg)  07/09/21 216 lb (98 kg)  06/25/21 214 lb (97.1 kg)    Assessment: Review of patient past medical history, allergies, medications, health status, including review of consultants reports, laboratory and other test data, was performed as part of comprehensive evaluation and provision of chronic care management services.   SDOH:  (Social Determinants of Health) assessments and interventions performed:    CCM Care Plan  Allergies  Allergen Reactions   No Known Allergies     Medications Reviewed Today     Reviewed by Beryle Lathe, [facility] (Pharmacist) on 07/19/21 at 1113  Med List Status: <None>   Medication Order Taking? Sig Documenting Provider Last Dose Status Informant  acetaminophen (TYLENOL) 325 MG tablet [id number] Yes Take 2 tablets (650 mg total) by mouth every 6 (six) hours as needed for mild pain (or Fever >/= 101). Cathlyn Parsons, PA-C Taking Active Spouse/Significant Other  albuterol (PROVENTIL) (2.5 MG/3ML) 0.083% nebulizer solution [id number] Yes Take 3 mLs (2.5 mg total) by nebulization every 6 (six) hours as needed for wheezing or shortness of breath. Doree Albee, MD Taking Active Spouse/Significant Other  Cholecalciferol (VITAMIN D3) 125 MCG (5000 UT) CAPS [id number] Yes Take 1 capsule (5,000 Units total) by mouth daily. Cathlyn Parsons, PA-C Taking Active Spouse/Significant Other  diltiazem (CARDIZEM CD) 120 MG 24 hr capsule [id number] Yes Take 1 capsule (120 mg total) by mouth daily. Cathlyn Parsons, PA-C Taking Active Spouse/Significant Other  fenofibrate (TRICOR) 145 MG tablet [id number] Yes Take 1 tablet (145 mg total) by mouth daily. Noreene Larsson, NP Taking Active Spouse/Significant Other  loratadine (CLARITIN) 10 MG tablet [id number] Yes Take 1 tablet (10 mg total) by mouth daily. Lindell Spar, MD Taking Active Spouse/Significant Other  meclizine (ANTIVERT) 25 MG tablet [id number] Yes Take 1  tablet (25 mg total) by mouth 3 (three) times daily as needed for dizziness. Lindell Spar, MD Taking Active Spouse/Significant Other  metoprolol succinate (TOPROL-XL) 50 MG 24 hr tablet [id number] Yes Take 1 tablet (50 mg total) by mouth daily. Take with or immediately following a meal. Angiulli, Lavon Paganini, PA-C Taking Active Spouse/Significant Other  oxyCODONE (OXY IR/ROXICODONE) 5 MG immediate release tablet [id number] Yes Take 1-2 tablets (5-10 mg total) by mouth every 4 (four) hours as needed for up to 3 days for moderate pain or severe pain. Deatra James, MD Taking Active   OXYGEN [id number] Yes Inhale 2-3 L into the lungs continuous. Hx of lung ca, copd, and emphysema [provider] Taking Active Spouse/Significant Other  polyethylene glycol (MIRALAX / GLYCOLAX) 17 g packet [id number] No Take 17 g by mouth daily as needed for mild constipation.  Patient not taking: Reported on 07/19/2021   Cathlyn Parsons, PA-C Not Taking Active Spouse/Significant Other  pravastatin (PRAVACHOL) 80 MG tablet [id number] Yes Take 1 tablet (80 mg total) by mouth daily. Lindell Spar, MD Taking Active Spouse/Significant Other  tamsulosin [facility]) 0.4 MG CAPS capsule [id number] Yes Take 1 capsule (0.4 mg total) by mouth daily. Lindell Spar, MD Taking Active Spouse/Significant Other  traZODone (DESYREL) 50 MG tablet [id number] Yes Take 0.5 tablets (25 mg total) by mouth at bedtime.  Patient taking differently: Take 50 mg by mouth at bedtime.   Ailene Ards, NP Taking Active   warfarin (COUMADIN) 5 MG tablet [id number] Yes Take 1 tablet (5 mg total) by mouth daily. 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 ([place]) 03/14/2021   Right thalamic infarction ([place]) 03/14/2021   Right sided weakness 03/12/2021   Hypokalemia 03/12/2021   Hyperglycemia 03/12/2021   CVA (cerebral vascular accident) ([place]) 03/12/2021   Aortic atherosclerosis ([place]) 08/30/2020   Elevated coronary artery calcium score 08/30/2020   Hyperlipidemia 11/17/2019   TSH (thyroid-stimulating hormone deficiency) 11/17/2019   S/P Wedge Resection RUL, Blebectomy RUL 12/09/2018   Primary lung adenocarcinoma ([place]) 10/15/2016   PAF (paroxysmal atrial fibrillation) ([place]) 09/16/2016   Encounter for therapeutic drug monitoring 09/16/2016   S/P lobectomy of lung 08/05/2016  Cancer of left lung ([place]) 08/01/2016   Lung nodule 06/11/2016   Former smoker 06/11/2016   Emphysema of lung ([place]) 06/11/2016   Essential (primary) hypertension 10/06/2014    Immunization History  Administered Date(s) Administered   Moderna Sars-Covid-2 Vaccination 12/20/2019, 01/13/2020   Pneumococcal Polysaccharide-23 03/01/2021   Tdap 03/07/2018    Conditions to be addressed/monitored: Atrial Fibrillation, HTN, HLD, and DMII  Care Plan : Medication Management  Updates made by Beryle Lathe, [place] since 07/19/2021 12:00 AM     Problem: HLD/CVA, HTN, T2DM, Afib   Priority: High  Onset Date: 07/19/2021     Goal: Disease Progression Prevention   Start Date: 07/19/2021  Expected End Date: 10/17/2021  This Visit's Progress: On track  Priority: High  Note:   Current Barriers:  Unable to independently afford treatment regimen Unable to achieve control of hyperlipidemia Suboptimal therapeutic regimen for hyperlipidemia  Pharmacist Clinical Goal(s):  Patient will verbalize ability to afford treatment regimen achieve control of hyperlipidemia as evidenced by improved LDL and improved triglycerides through collaboration with PharmD and provider.    Interventions: 1:1 collaboration with Noreene Larsson, NP regarding development and update of comprehensive plan of care as evidenced by provider attestation and co-signature Inter-disciplinary care team collaboration (see longitudinal plan of care) Comprehensive medication review performed; medication list updated in electronic medical record  Type 2 Diabetes - New goal.: Controlled; Most recent A1c at goal of <7% per [name] guidelines Current medications:  none, PCP recently discontinued glimepiride Intolerances: none Taking medications as directed: n/a Side effects thought to be attributed to current medication regimen: n/a Hypoglycemia prevention: not indicated at this time Current meal patterns: not discussed today Current exercise: not discussed today On a statin: yes On aspirin 81 mg daily: no, since he is on blood thinner for Afib Last microalbumin: 6.3; on an ACEi/ARB: no Last eye exam: overdue Last foot exam: overdue Pneumonia vaccine:  PPSV23 in June 2022; if never received PCV then recommend PCV20 single dose Influenza vaccine:  needs this fall Current glucose readings:  not discussed today Continue to monitor blood glucose and A1c; could consider GLP-1 or SGLT-2 inhibitor due to ASCVD risk independent of A1c; however, cost is prohibitive at this time  Hypertension - New goal.: Blood pressure under good control. Blood pressure is at goal of <130/80 mmHg per 2017 AHA/ACC guidelines. Current medications: diltiazem 120 mg by mouth once daily and metoprolol succinate 50 mg by mouth once daily Intolerances: none Taking medications as directed: yes Side effects thought to be attributed to current medication regimen: no Current home blood pressure: not discussed today Continue diltiazem 120 mg by mouth once daily and metoprolol succinate 50 mg by mouth once daily Encourage dietary sodium restriction/DASH diet Recommend home blood pressure monitoring to discuss at next  visit Discussed need for medication compliance  Hyperlipidemia/history of stroke (03/12/21) - New goal.: Uncontrolled. LDL above goal of <70 due to very high risk given established clinical ASCVD per 2020 AACE/ACE guidelines. Triglycerides above goal of <150 per 2020 AACE/ACE guidelines. Current medications: pravastatin 80 mg by mouth once daily (recently increased from 40 mg by mouth daily) Intolerances: none Taking medications as directed: yes Side effects thought to be attributed to current medication regimen: no Encourage dietary reduction of high fat containing foods such as butter, nuts, bacon, egg yolks, etc. Discussed need for medication compliance Re-check lipid panel in 4-12 weeks Given clinical ASCVD, would recommend high intensity statin such as rosuvastatin 20 or 40 mg by mouth  daily or atorvastatin 40 or 80 mg by mouth daily for secondary prevention. If LDL remains above goal on high intensity statin, would recommend initiation of ezetimibe 10 mg by mouth daily.  For elevated triglycerides, continue fibrate and statin therapy. Consider omega-3 therapy next. Vascepa would be preferred; however, this is cost prohibitive. Could have patient take over the counter fish oil 1-2 of EPA twice daily.   Atrial Fibrillation - New goal.: Followed by Dr. Percival Spanish Controlled. Most recent ECG: normal sinus. Current rate control: metoprolol succinate 50 mg by mouth once daily and diltiazem 120 mg by mouth once daily Anticoagulation: warfarin (Coumadin) 5 mg by mouth daily managed by [facility] in [place] CHADS2VASc score: 5 - Age (1 point), Hypertension history (1 point), Stroke/TIA/Thromboembolism history (2 points), and Diabetes history (1 point) Denies signs and symptoms of bleeding Discussed need for medication compliance Continue current management per cardiology  Financial constraints Patient reports cost concerns for his medications. Medication assistance evaluation completed; however,  patient is on all generic medications and there are no applicable assistance programs through the manufacturer. The patient does not currently have Medicare part D. Based on current household size and income, patient should qualify for Medicare Extra Help. Patient instructed to call the local social security office to apply for Medicare Extra Help. Of note, the application for Extra Help does not enroll the patient in a Medicare Prescription Drug Plan. He does not need to be enrolled in a Medicare Prescription Drug Plan to file for Extra Help. However, the Extra Help assistance does not start until he is enrolled with an approved Medicare prescription drug provider. Patient also given information for local [facility] office to consider enrolling in a Medicare Prescription Drug Plan.  Patient Goals/Self-Care Activities Patient will:  Take medications as prescribed Check blood pressure at least once daily, document, and provide at future appointments Collaborate with provider on medication access solutions  Follow Up Plan: The patient has been provided with contact information for the care management team and has been advised to call with any health related questions or concerns.       Medication Assistance:  Patient instructed to apply for Medicare Extra Help and consider Medicare Prescription Drug Plan  Patient's preferred pharmacy is:  [facility] [address], [address] [address] [place] [address] Phone: [phone] Fax: [phone]  [id number] [place], [place] [address] Phone: [phone] Fax: [phone]  Follow Up:  Patient requests no follow-up at this time.  Plan: The patient has been provided with contact information for the care management team and has been advised to call with any health related questions or concerns.   Kennon Holter, PharmD Clinical Pharmacist [facility]  Primary Care [phone]

## 2021-07-23 ENCOUNTER — Other Ambulatory Visit: Payer: Self-pay | Admitting: *Deleted

## 2021-07-23 DIAGNOSIS — I69311 Memory deficit following cerebral infarction: Secondary | ICD-10-CM | POA: Diagnosis not present

## 2021-07-23 DIAGNOSIS — I69351 Hemiplegia and hemiparesis following cerebral infarction affecting right dominant side: Secondary | ICD-10-CM | POA: Diagnosis not present

## 2021-07-23 DIAGNOSIS — Z48815 Encounter for surgical aftercare following surgery on the digestive system: Secondary | ICD-10-CM | POA: Diagnosis not present

## 2021-07-23 DIAGNOSIS — J439 Emphysema, unspecified: Secondary | ICD-10-CM | POA: Diagnosis not present

## 2021-07-23 DIAGNOSIS — C3491 Malignant neoplasm of unspecified part of right bronchus or lung: Secondary | ICD-10-CM | POA: Diagnosis not present

## 2021-07-23 DIAGNOSIS — I480 Paroxysmal atrial fibrillation: Secondary | ICD-10-CM | POA: Diagnosis not present

## 2021-07-23 LAB — POCT INR: INR: 3 (ref 2.0–3.0)

## 2021-07-23 MED ORDER — DILTIAZEM HCL ER COATED BEADS 120 MG PO CP24: 120.0000 mg | ORAL_CAPSULE | Freq: Every day | ORAL | 0 refills | Status: DC

## 2021-07-24 ENCOUNTER — Telehealth: Payer: Self-pay | Admitting: Cardiology

## 2021-07-24 ENCOUNTER — Ambulatory Visit (INDEPENDENT_AMBULATORY_CARE_PROVIDER_SITE_OTHER): Payer: Medicare Other | Admitting: Cardiology

## 2021-07-24 DIAGNOSIS — Z5181 Encounter for therapeutic drug level monitoring: Secondary | ICD-10-CM | POA: Diagnosis not present

## 2021-07-24 NOTE — Patient Instructions (Signed)
Description   [facility] RN, called in INR results, instructed for pt to continue warfarin 5mg  daily. Recheck in 4 wks in office- appointment made.

## 2021-07-24 NOTE — Telephone Encounter (Signed)
Error

## 2021-07-25 DIAGNOSIS — Z48815 Encounter for surgical aftercare following surgery on the digestive system: Secondary | ICD-10-CM | POA: Diagnosis not present

## 2021-07-25 DIAGNOSIS — C3491 Malignant neoplasm of unspecified part of right bronchus or lung: Secondary | ICD-10-CM | POA: Diagnosis not present

## 2021-07-25 DIAGNOSIS — I69311 Memory deficit following cerebral infarction: Secondary | ICD-10-CM | POA: Diagnosis not present

## 2021-07-25 DIAGNOSIS — J439 Emphysema, unspecified: Secondary | ICD-10-CM | POA: Diagnosis not present

## 2021-07-25 DIAGNOSIS — I69351 Hemiplegia and hemiparesis following cerebral infarction affecting right dominant side: Secondary | ICD-10-CM | POA: Diagnosis not present

## 2021-07-25 DIAGNOSIS — I480 Paroxysmal atrial fibrillation: Secondary | ICD-10-CM | POA: Diagnosis not present

## 2021-07-26 ENCOUNTER — Ambulatory Visit: Payer: Medicare Other

## 2021-07-26 ENCOUNTER — Telehealth: Payer: Self-pay | Admitting: *Deleted

## 2021-07-26 ENCOUNTER — Other Ambulatory Visit: Payer: Self-pay

## 2021-07-26 NOTE — Telephone Encounter (Signed)
Following up on the [place] coumadin clinic book.  Pt's INR was checked on 10/25 and is scheduled to come into office to have INR checked on 11/22.  Pt was recently in the hospital 10/15-10/17 for acute appendicitis. HH will still be going out to pt's home for a few more weeks, will have home health check pt's INR on 11/1 while they are in pt's home.   Called [facility] nurse, Otila Kluver, at [phone] and confirmed that she would be able to check pt's INR when she sees pt on 11/1.

## 2021-07-27 ENCOUNTER — Encounter: Payer: Self-pay | Admitting: Internal Medicine

## 2021-07-27 DIAGNOSIS — J439 Emphysema, unspecified: Secondary | ICD-10-CM | POA: Diagnosis not present

## 2021-07-27 DIAGNOSIS — I69311 Memory deficit following cerebral infarction: Secondary | ICD-10-CM | POA: Diagnosis not present

## 2021-07-27 DIAGNOSIS — Z48815 Encounter for surgical aftercare following surgery on the digestive system: Secondary | ICD-10-CM | POA: Diagnosis not present

## 2021-07-27 DIAGNOSIS — C3492 Malignant neoplasm of unspecified part of left bronchus or lung: Secondary | ICD-10-CM

## 2021-07-27 DIAGNOSIS — C3491 Malignant neoplasm of unspecified part of right bronchus or lung: Secondary | ICD-10-CM | POA: Diagnosis not present

## 2021-07-27 DIAGNOSIS — I480 Paroxysmal atrial fibrillation: Secondary | ICD-10-CM | POA: Diagnosis not present

## 2021-07-27 DIAGNOSIS — I69351 Hemiplegia and hemiparesis following cerebral infarction affecting right dominant side: Secondary | ICD-10-CM | POA: Diagnosis not present

## 2021-07-27 MED ORDER — ALBUTEROL SULFATE (2.5 MG/3ML) 0.083% IN NEBU: 2.5000 mg | INHALATION_SOLUTION | Freq: Four times a day (QID) | RESPIRATORY_TRACT | 3 refills | Status: DC | PRN

## 2021-07-30 ENCOUNTER — Other Ambulatory Visit: Payer: Self-pay | Admitting: Internal Medicine

## 2021-07-30 DIAGNOSIS — I639 Cerebral infarction, unspecified: Secondary | ICD-10-CM | POA: Diagnosis not present

## 2021-07-30 DIAGNOSIS — E785 Hyperlipidemia, unspecified: Secondary | ICD-10-CM

## 2021-07-30 DIAGNOSIS — I480 Paroxysmal atrial fibrillation: Secondary | ICD-10-CM

## 2021-07-30 DIAGNOSIS — I10 Essential (primary) hypertension: Secondary | ICD-10-CM | POA: Diagnosis not present

## 2021-07-30 DIAGNOSIS — E1169 Type 2 diabetes mellitus with other specified complication: Secondary | ICD-10-CM

## 2021-07-30 DIAGNOSIS — G4700 Insomnia, unspecified: Secondary | ICD-10-CM

## 2021-07-30 MED ORDER — TRAZODONE HCL 50 MG PO TABS: 50.0000 mg | ORAL_TABLET | Freq: Every day | ORAL | 1 refills | Status: DC

## 2021-07-31 ENCOUNTER — Other Ambulatory Visit: Payer: Self-pay

## 2021-07-31 ENCOUNTER — Ambulatory Visit (INDEPENDENT_AMBULATORY_CARE_PROVIDER_SITE_OTHER): Payer: Medicare Other | Admitting: General Surgery

## 2021-07-31 ENCOUNTER — Encounter: Payer: Self-pay | Admitting: Internal Medicine

## 2021-07-31 ENCOUNTER — Ambulatory Visit (INDEPENDENT_AMBULATORY_CARE_PROVIDER_SITE_OTHER): Payer: Medicare Other

## 2021-07-31 ENCOUNTER — Ambulatory Visit (INDEPENDENT_AMBULATORY_CARE_PROVIDER_SITE_OTHER): Payer: Medicare Other | Admitting: Internal Medicine

## 2021-07-31 VITALS — BP 134/77 | HR 64 | Resp 18 | Ht 70.0 in | Wt 216.0 lb

## 2021-07-31 DIAGNOSIS — I7143 Infrarenal abdominal aortic aneurysm, without rupture: Secondary | ICD-10-CM | POA: Diagnosis not present

## 2021-07-31 DIAGNOSIS — Z0000 Encounter for general adult medical examination without abnormal findings: Secondary | ICD-10-CM

## 2021-07-31 DIAGNOSIS — Z09 Encounter for follow-up examination after completed treatment for conditions other than malignant neoplasm: Secondary | ICD-10-CM | POA: Diagnosis not present

## 2021-07-31 DIAGNOSIS — Z9049 Acquired absence of other specified parts of digestive tract: Secondary | ICD-10-CM | POA: Diagnosis not present

## 2021-07-31 DIAGNOSIS — D72829 Elevated white blood cell count, unspecified: Secondary | ICD-10-CM | POA: Diagnosis not present

## 2021-07-31 DIAGNOSIS — K3580 Unspecified acute appendicitis: Secondary | ICD-10-CM

## 2021-07-31 NOTE — Progress Notes (Signed)
Subjective:   Randall Sullivan is a 70 y.o. male who presents for Medicare Annual/Subsequent preventive examination.  I connected with  Randall Sullivan on 07/31/21 by a audio enabled telemedicine application and verified that I am speaking with the correct person using two identifiers.   I discussed the limitations, risks, security and privacy concerns of performing an evaluation and management service by telephone and the availability of in person appointments. I also discussed with the patient that there may be a patient responsible charge related to this service. The patient expressed understanding and verbally consented to this telephonic visit.    Review of Systems           Objective:    There were no vitals filed for this visit. There is no height or weight on file to calculate BMI.  Advanced Directives 07/14/2021 03/14/2021 03/12/2021 03/12/2021 03/12/2021 02/05/2019 12/11/2018  Does Patient Have a Medical Advance Directive? Yes No - No No No No  Type of Advance Directive Living will;Healthcare Power of Attorney - - - - - -  Does patient want to make changes to medical advance directive? No - Guardian declined - - - - - -  Copy of [place] in Chart? Yes - validated most recent copy scanned in chart (See row information) - - - - - -  Would patient like information on creating a medical advance directive? - No - Patient declined No - Guardian declined - - No - Patient declined No - Patient declined  Pre-existing out of facility DNR order (yellow form or pink MOST form) - - - - - - -    Current Medications (verified) Outpatient Encounter Medications as of 07/31/2021  Medication Sig   acetaminophen (TYLENOL) 325 MG tablet Take 2 tablets (650 mg total) by mouth every 6 (six) hours as needed for mild pain (or Fever >/= 101).   albuterol (PROVENTIL) (2.5 MG/3ML) 0.083% nebulizer solution Take 3 mLs (2.5 mg total) by nebulization every 6 (six) hours as needed for wheezing or  shortness of breath.   Cholecalciferol (VITAMIN D3) 125 MCG (5000 UT) CAPS Take 1 capsule (5,000 Units total) by mouth daily.   diltiazem (CARDIZEM CD) 120 MG 24 hr capsule Take 1 capsule (120 mg total) by mouth daily.   fenofibrate (TRICOR) 145 MG tablet Take 1 tablet (145 mg total) by mouth daily.   loratadine (CLARITIN) 10 MG tablet Take 1 tablet (10 mg total) by mouth daily.   meclizine (ANTIVERT) 25 MG tablet Take 1 tablet (25 mg total) by mouth 3 (three) times daily as needed for dizziness.   metoprolol succinate (TOPROL-XL) 50 MG 24 hr tablet Take 1 tablet (50 mg total) by mouth daily. Take with or immediately following a meal.   OXYGEN Inhale 2-3 L into the lungs continuous. Hx of lung ca, copd, and emphysema   polyethylene glycol (MIRALAX / GLYCOLAX) 17 g packet Take 17 g by mouth daily as needed for mild constipation. (Patient not taking: Reported on 07/19/2021)   pravastatin (PRAVACHOL) 80 MG tablet Take 1 tablet (80 mg total) by mouth daily.   tamsulosin (FLOMAX) 0.4 MG CAPS capsule Take 1 capsule (0.4 mg total) by mouth daily.   traZODone (DESYREL) 50 MG tablet Take 1 tablet (50 mg total) by mouth at bedtime.   warfarin (COUMADIN) 5 MG tablet Take 1 tablet (5 mg total) by mouth daily.   No facility-administered encounter medications on file as of 07/31/2021.    Allergies (verified) No known  allergies   History: Past Medical History:  Diagnosis Date   A-fib ([place]) 2017   after last lung surgery patient had a history if Afib documented in hospital    Allergy    Arthritis    back & legs ( knees)   Cancer ([place])    lung   Complication of anesthesia    agitated upon waking from anesth.    Emphysema    History of lung cancer    Hyperlipidemia    Hypertension    Oxygen dependent    2L continuous   Stroke ([place])    Tobacco abuse    Past Surgical History:  Procedure Laterality Date   HERNIA REPAIR Bilateral 0539   umbilical   LAPAROSCOPIC APPENDECTOMY N/A 07/15/2021    Procedure: APPENDECTOMY LAPAROSCOPIC;  Surgeon: Virl Cagey, MD;  Location: [facility];  Service: General;  Laterality: N/A;   VIDEO ASSISTED THORACOSCOPY Right 12/21/2018   Procedure: VIDEO ASSISTED THORACOSCOPY AND RESECTION OF BLEBS RIGHT LOWER LOBE;  Surgeon: [name], MD;  Location: [place];  Service: Thoracic;  Laterality: Right;   VIDEO ASSISTED THORACOSCOPY (VATS)/ LOBECTOMY Left 08/01/2016   Procedure: VIDEO ASSISTED THORACOSCOPY (VATS)/ LEFT UPPER LOBECTOMY with lymph node sampling and onQ placement;  Surgeon: [name], MD;  Location: [place];  Service: Thoracic;  Laterality: Left;   VIDEO ASSISTED THORACOSCOPY (VATS)/WEDGE RESECTION Right 12/09/2018   Procedure: VIDEO ASSISTED THORACOSCOPY (VATS)/WEDGE RESECTION;  Surgeon: [name], MD;  Location: [place];  Service: Thoracic;  Laterality: Right;   VIDEO BRONCHOSCOPY Bilateral 12/21/2018   Procedure: VIDEO BRONCHOSCOPY;  Surgeon: [name], MD;  Location: [place];  Service: Thoracic;  Laterality: Bilateral;   VIDEO BRONCHOSCOPY WITH ENDOBRONCHIAL NAVIGATION N/A 07/03/2016   Procedure: VIDEO BRONCHOSCOPY WITH ENDOBRONCHIAL NAVIGATION;  Surgeon: [name], MD;  Location: [place];  Service: Thoracic;  Laterality: N/A;   VIDEO BRONCHOSCOPY WITH INSERTION OF INTERBRONCHIAL VALVE (IBV) N/A 12/11/2018   Procedure: VIDEO BRONCHOSCOPY WITH INSERTION OF INTERBRONCHIAL VALVE (IBV);  Surgeon: [name], MD;  Location: [facility];  Service: Thoracic;  Laterality: N/A;   VIDEO BRONCHOSCOPY WITH INSERTION OF INTERBRONCHIAL VALVE (IBV) N/A 02/10/2019   Procedure: VIDEO BRONCHOSCOPY WITH REMOVAL OF INTERBRONCHIAL VALVE (IBV);  Surgeon: [name], MD;  Location: [facility];  Service: Thoracic;  Laterality: N/A;   Family History  Problem Relation Age of Onset   Cancer Mother    Cancer Sister    Aneurysm Sister    Kidney disease Paternal Aunt    Asthma Brother    Cancer - Lung Brother     Cancer Brother    Social History   Socioeconomic History   Marital status: Married    Spouse name: Not on file   Number of children: 2   Years of education: Not on file   Highest education level: Not on file  Occupational History   Occupation: Vinyl siding   Occupation: Retired  Tobacco Use   Smoking status: Former    Packs/day: 1.00    Years: 40.00    Pack years: 40.00    Types: Cigarettes    Quit date: 05/28/2016    Years since quitting: 5.1   Smokeless tobacco: Never  Vaping Use   Vaping Use: Never used  Substance and Sexual Activity   Alcohol use: Not Currently    Comment: quit- 2016   Drug use: No   Sexual activity: Not Currently  Other Topics Concern   Not on file  Social History Narrative   Married for 36 years,second.Lives with wife. 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   Social Determinants of Health   Financial Resource Strain: Not on file  Food Insecurity: Not on file  Transportation Needs: Not on file  Physical Activity: Not on file  Stress: Not on file  Social Connections: Not on file    Tobacco Counseling Counseling given: Not Answered   Clinical Intake:              How often do you need to have someone help you when you read instructions, pamphlets, or other written materials from your doctor or pharmacy?: (P) 4 - Often  Diabetic? No         Activities of Daily Living In your present state of health, do you have any difficulty performing the following activities: 07/30/2021 07/14/2021  Hearing? N N  Vision? Y Y  Difficulty concentrating or making decisions? Tempie Donning  Walking or climbing stairs? - N  Dressing or bathing? N N  Doing errands, shopping? Tempie Donning  Preparing Food and eating ? N -  Using the Toilet? N -  In the past six months, have you accidently leaked urine? N -  Do you have problems with loss of bowel control? N -  Managing your Medications? N -  Managing your Finances? Y -  Housekeeping or managing your  Housekeeping? N -  Some recent data might be hidden    Patient Care Team: Noreene Larsson, NP as PCP - General (Nurse Practitioner) Beryle Lathe, [facility] (Pharmacist)  Indicate any recent Medical Services you may have received from other than [facility] providers in the past year (date may be approximate).     Assessment:   This is a routine wellness examination for Randall Sullivan.  Hearing/Vision screen No results found.  Dietary issues and exercise activities discussed:     Goals Addressed   None   Depression Screen PHQ 2/9 Scores 07/09/2021 06/25/2021 04/05/2021 03/01/2021 10/26/2020 12/16/2019 11/17/2019  PHQ - 2 Score 0 0 0 0 0 0 0  PHQ- 9 Score 4 - 5 0 0 - -    Fall Risk Fall Risk  07/30/2021 07/09/2021 06/25/2021 04/05/2021 03/01/2021  Falls in the past year? 0 0 0 0 0  Number falls in past yr: - - 0 0 -  Injury with Fall? - - 0 0 -  Risk for fall due to : - - No Fall Risks - -  Follow up - - - - -    [place]:  Any stairs in or around the home? Yes  If so, are there any without handrails? Yes  Home free of loose throw rugs in walkways, pet beds, electrical cords, etc? Yes  Adequate lighting in your home to reduce risk of falls? Yes   ASSISTIVE DEVICES UTILIZED TO PREVENT FALLS:  Life alert? No  Use of a cane, walker or w/c? Yes  Grab bars in the bathroom? Yes  Shower chair or bench in shower? Yes  Elevated toilet seat or a handicapped toilet? No   TIMED UP AND GO:  Was the test performed? No .  Length of time to ambulate 10 feet: N/A sec.     Cognitive Function:        Immunizations Immunization History  Administered Date(s) Administered   [employer] Vaccination 12/20/2019, 01/13/2020   Pneumococcal Polysaccharide-23 03/01/2021   Tdap 03/07/2018    TDAP status: Up to date  Flu Vaccine status:  Due, Education has been provided regarding the importance of this vaccine. Advised may receive this vaccine at local  pharmacy or Health Dept. Aware to provide a copy of the vaccination record if obtained from local pharmacy or Health Dept. Verbalized acceptance and understanding.  Pneumococcal vaccine status: Up to date  Covid-19 vaccine status: Completed vaccines  Qualifies for Shingles Vaccine? Yes   Zostavax completed No   Shingrix Completed?: No.    Education has been provided regarding the importance of this vaccine. Patient has been advised to call insurance company to determine out of pocket expense if they have not yet received this vaccine. Advised may also receive vaccine at local pharmacy or Health Dept. Verbalized acceptance and understanding.  Screening Tests Health Maintenance  Topic Date Due   FOOT EXAM  Never done   OPHTHALMOLOGY EXAM  Never done   Hepatitis C Screening  Never done   Zoster Vaccines- Shingrix (1 of 2) Never done   COLONOSCOPY (Pts 45-5yrs Insurance coverage will need to be confirmed)  Never done   COVID-19 Vaccine (3 - Moderna risk series) 02/10/2020   INFLUENZA VACCINE  Never done   HEMOGLOBIN A1C  01/03/2022   Pneumonia Vaccine 2+ Years old (2 - PCV) 03/01/2022   URINE MICROALBUMIN  07/09/2022   TETANUS/TDAP  03/07/2028   HPV VACCINES  Aged Out    Health Maintenance  Health Maintenance Due  Topic Date Due   FOOT EXAM  Never done   OPHTHALMOLOGY EXAM  Never done   Hepatitis C Screening  Never done   Zoster Vaccines- Shingrix (1 of 2) Never done   COLONOSCOPY (Pts 45-68yrs Insurance coverage will need to be confirmed)  Never done   COVID-19 Vaccine (3 - Moderna risk series) 02/10/2020   INFLUENZA VACCINE  Never done    Colorectal cancer screening: Referral to GI placed  . Pt aware the office will call re: appt.  Lung Cancer Screening: (Low Dose CT Chest recommended if Age 17-80 years, 30 pack-year currently smoking OR have quit w/in 15years.) does qualify.   Lung Cancer Screening Referral: YES  Additional Screening:  Hepatitis C Screening: does  qualify; Completed NO  Vision Screening: Recommended annual ophthalmology exams for early detection of glaucoma and other disorders of the eye. Is the patient up to date with their annual eye exam?  No  Who is the provider or what is the name of the office in which the patient attends annual eye exams?  If pt is not established with a provider, would they like to be referred to a provider to establish care? No .   Dental Screening: Recommended annual dental exams for proper oral hygiene  Community Resource Referral / Chronic Care Management: CRR required this visit?  No   CCM required this visit?  No      Plan:     I have personally reviewed and noted the following in the patient's chart:   Medical and social history Use of alcohol, tobacco or illicit drugs  Current medications and supplements including opioid prescriptions. Patient is not currently taking opioid prescriptions. Functional ability and status Nutritional status Physical activity Advanced directives List of other physicians Hospitalizations, surgeries, and ER visits in previous 12 months Vitals Screenings to include cognitive, depression, and falls Referrals and appointments  In addition, I have reviewed and discussed with patient certain preventive protocols, quality metrics, and best practice recommendations. A written personalized care plan for preventive services as well as general preventive health recommendations were provided  to patient.     Quentin Angst, [place]   07/31/2021   Nurse Notes: This is a tele health visit with the patient at home. The provider was in the office and is Rutwik Patel,MD.

## 2021-07-31 NOTE — Assessment & Plan Note (Signed)
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  Offered to refer to Vascular surgeon Wants to discuss with his Cardiologist - Dr Percival Spanish

## 2021-07-31 NOTE — Progress Notes (Signed)
[facility]  I am calling the patient for post operative evaluation. This is not a billable encounter as it is under the [facility] charges for the surgery.  The patient had a laparoscopic appendectomy on 07/15/2021. The patient reports that he is doing well. The are tolerating a diet, having good pain control, and having regular Bms.  The incisions are healing well. No issues or worries. The patient has no concerns.  Dr. Posey Pronto saw him today. Still getting his coumadin stabilized.   Pathology: FINAL MICROSCOPIC DIAGNOSIS:   A. APPENDIX, APPENDECTOMY:  - Acute suppurative appendicitis with acute serositis.   Will see the patient PRN.   Curlene Labrum, MD [facility] [address] [place], [place] [address] [phone] (office)

## 2021-07-31 NOTE — Progress Notes (Signed)
Established Patient Office Visit  Subjective:  Patient ID: Randall Sullivan, male    DOB: 02-23-1951  Age: 70 y.o. MRN: [id number]  CC:  Chief Complaint  Patient presents with   Transitions Of Care    Pt was in [facility] had appendectomy is still sore from having removed    HPI Randall Sullivan presents for hospital discharge follow up.  He was admitted for acute appendicitis on 10/15 and had laparoscopic appendectomy on 10/16.  He tolerated the procedure well.  He was discharged on 10/17.  He received Zofran in the hospital.  He denies any nausea, vomiting, fever, chills, melena or hematochezia since being discharged from the hospital.  He has been taking Coumadin 5 mg and his last INR was 3.0 after being discharged.  He complains of mild abdominal discomfort at times in right lower quadrant, but denies overt pain.  CT abdomen in the hospital also showed infrarenal AAA, and he is going to discuss about it with his Cardiologist.   Past Medical History:  Diagnosis Date   A-fib ([place]) 2017   after last lung surgery patient had a history if Afib documented in hospital    Allergy    Arthritis    back & legs ( knees)   Cancer ([place])    lung   Complication of anesthesia    agitated upon waking from anesth.    Emphysema    History of lung cancer    Hyperlipidemia    Hypertension    Oxygen dependent    2L continuous   Stroke ([place])    Tobacco abuse     Past Surgical History:  Procedure Laterality Date   HERNIA REPAIR Bilateral 2197   umbilical   LAPAROSCOPIC APPENDECTOMY N/A 07/15/2021   Procedure: APPENDECTOMY LAPAROSCOPIC;  Surgeon: Virl Cagey, MD;  Location: [facility];  Service: General;  Laterality: N/A;   VIDEO ASSISTED THORACOSCOPY Right 12/21/2018   Procedure: VIDEO ASSISTED THORACOSCOPY AND RESECTION OF BLEBS RIGHT LOWER LOBE;  Surgeon: [name], MD;  Location: [place];  Service: Thoracic;  Laterality: Right;   VIDEO ASSISTED THORACOSCOPY (VATS)/ LOBECTOMY Left  08/01/2016   Procedure: VIDEO ASSISTED THORACOSCOPY (VATS)/ LEFT UPPER LOBECTOMY with lymph node sampling and onQ placement;  Surgeon: [name], MD;  Location: [place];  Service: Thoracic;  Laterality: Left;   VIDEO ASSISTED THORACOSCOPY (VATS)/WEDGE RESECTION Right 12/09/2018   Procedure: VIDEO ASSISTED THORACOSCOPY (VATS)/WEDGE RESECTION;  Surgeon: [name], MD;  Location: [place];  Service: Thoracic;  Laterality: Right;   VIDEO BRONCHOSCOPY Bilateral 12/21/2018   Procedure: VIDEO BRONCHOSCOPY;  Surgeon: [name], MD;  Location: [place];  Service: Thoracic;  Laterality: Bilateral;   VIDEO BRONCHOSCOPY WITH ENDOBRONCHIAL NAVIGATION N/A 07/03/2016   Procedure: VIDEO BRONCHOSCOPY WITH ENDOBRONCHIAL NAVIGATION;  Surgeon: [name], MD;  Location: [place];  Service: Thoracic;  Laterality: N/A;   VIDEO BRONCHOSCOPY WITH INSERTION OF INTERBRONCHIAL VALVE (IBV) N/A 12/11/2018   Procedure: VIDEO BRONCHOSCOPY WITH INSERTION OF INTERBRONCHIAL VALVE (IBV);  Surgeon: [name], MD;  Location: [facility] OR;  Service: Thoracic;  Laterality: N/A;   VIDEO BRONCHOSCOPY WITH INSERTION OF INTERBRONCHIAL VALVE (IBV) N/A 02/10/2019   Procedure: VIDEO BRONCHOSCOPY WITH REMOVAL OF INTERBRONCHIAL VALVE (IBV);  Surgeon: [name], MD;  Location: [facility];  Service: Thoracic;  Laterality: N/A;    Family History  Problem Relation Age of Onset   Cancer Mother    Cancer Sister    Aneurysm Sister  Kidney disease Paternal Aunt    Asthma Brother    Cancer - Lung Brother    Cancer Brother     Social History   Socioeconomic History   Marital status: Married    Spouse name: Not on file   Number of children: 2   Years of education: Not on file   Highest education level: Not on file  Occupational History   Occupation: Vinyl siding   Occupation: Retired  Tobacco Use   Smoking status: Former    Packs/day: 1.00    Years: 40.00    Pack years: 40.00    Types:  Cigarettes    Quit date: 05/28/2016    Years since quitting: 5.1   Smokeless tobacco: Never  Vaping Use   Vaping Use: Never used  Substance and Sexual Activity   Alcohol use: Not Currently    Comment: quit- 2016   Drug use: No   Sexual activity: Not Currently  Other Topics Concern   Not on file  Social History Narrative   Married for 23 years,second.Lives with wife. 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   Social Determinants of Health   Financial Resource Strain: Not on file  Food Insecurity: Not on file  Transportation Needs: No Transportation Needs   Lack of Transportation (Medical): No   Lack of Transportation (Non-Medical): No  Physical Activity: Insufficiently Active   Days of Exercise per Week: 7 days   Minutes of Exercise per Session: 20 min  Stress: Not on file  Social Connections: Moderately Isolated   Frequency of Communication with Friends and Family: More than three times a week   Frequency of Social Gatherings with Friends and Family: Once a week   Attends Religious Services: Never   Marine scientist or Organizations: No   Attends Music therapist: Never   Marital Status: Married  Human resources officer Violence: Not At Risk   Fear of Current or Ex-Partner: No   Emotionally Abused: No   Physically Abused: No   Sexually Abused: No    Outpatient Medications Prior to Visit  Medication Sig Dispense Refill   acetaminophen (TYLENOL) 325 MG tablet Take 2 tablets (650 mg total) by mouth every 6 (six) hours as needed for mild pain (or Fever >/= 101).     albuterol (PROVENTIL) (2.5 MG/3ML) 0.083% nebulizer solution Take 3 mLs (2.5 mg total) by nebulization every 6 (six) hours as needed for wheezing or shortness of breath. 180 mL 3   Cholecalciferol (VITAMIN D3) 125 MCG (5000 UT) CAPS Take 1 capsule (5,000 Units total) by mouth daily. 30 capsule 0   diltiazem (CARDIZEM CD) 120 MG 24 hr capsule Take 1 capsule (120 mg total) by mouth daily. 30 capsule 0    fenofibrate (TRICOR) 145 MG tablet Take 1 tablet (145 mg total) by mouth daily. 90 tablet 1   loratadine (CLARITIN) 10 MG tablet Take 1 tablet (10 mg total) by mouth daily. 90 tablet 0   meclizine (ANTIVERT) 25 MG tablet Take 1 tablet (25 mg total) by mouth 3 (three) times daily as needed for dizziness. 30 tablet 0   metoprolol succinate (TOPROL-XL) 50 MG 24 hr tablet Take 1 tablet (50 mg total) by mouth daily. Take with or immediately following a meal. 90 tablet 1   OXYGEN Inhale 2-3 L into the lungs continuous. Hx of lung ca, copd, and emphysema     polyethylene glycol (MIRALAX / GLYCOLAX) 17 g packet Take 17 g by mouth daily as needed for mild constipation. [place]  each 0   pravastatin (PRAVACHOL) 80 MG tablet Take 1 tablet (80 mg total) by mouth daily. 30 tablet 5   tamsulosin (FLOMAX) 0.4 MG CAPS capsule Take 1 capsule (0.4 mg total) by mouth daily. 30 capsule 3   traZODone (DESYREL) 50 MG tablet Take 1 tablet (50 mg total) by mouth at bedtime. 90 tablet 1   warfarin (COUMADIN) 5 MG tablet Take 1 tablet (5 mg total) by mouth daily. 90 tablet 0   No facility-administered medications prior to visit.    Allergies  Allergen Reactions   No Known Allergies     ROS Review of Systems  Constitutional:  Positive for fatigue. Negative for chills and fever.  HENT:  Negative for congestion, sinus pressure and sinus pain.   Eyes:  Positive for visual disturbance. Negative for redness.  Respiratory:  Negative for cough and shortness of breath.   Cardiovascular:  Negative for chest pain and palpitations.  Gastrointestinal:  Negative for blood in stool, constipation, diarrhea, nausea and vomiting.  Genitourinary:  Negative for dysuria and hematuria.  Musculoskeletal:  Negative for neck pain and neck stiffness.  Skin:  Negative for rash.  Neurological:  Negative for tremors, seizures, syncope and headaches.  Psychiatric/Behavioral:  Negative for agitation and behavioral problems.      Objective:     Physical Exam Vitals reviewed.  Constitutional:      General: He is not in acute distress.    Appearance: He is not diaphoretic.  HENT:     Head: Normocephalic and atraumatic.     Nose: Nose normal.     Mouth/Throat:     Mouth: Mucous membranes are moist.  Eyes:     General: No scleral icterus.    Extraocular Movements: Extraocular movements intact.  Cardiovascular:     Rate and Rhythm: Normal rate and regular rhythm.     Pulses: Normal pulses.     Heart sounds: Normal heart sounds. No murmur heard. Pulmonary:     Breath sounds: Normal breath sounds. No wheezing or rales.  Abdominal:     Palpations: Abdomen is soft.     Tenderness: There is no abdominal tenderness.     Comments: Port sites C/D/I  Musculoskeletal:     Cervical back: Neck supple. No tenderness.     Right lower leg: No edema.     Left lower leg: No edema.  Skin:    General: Skin is warm.     Findings: No rash.  Neurological:     General: No focal deficit present.     Mental Status: He is alert and oriented to person, place, and time. Mental status is at baseline.     Sensory: No sensory deficit.  Psychiatric:        Mood and Affect: Mood normal.        Behavior: Behavior normal.    BP 134/77 (BP Location: Left Arm, Patient Position: Sitting, Cuff Size: Normal)   Pulse 64   Resp 18   Ht _0  (1.778 m)   Wt 216 lb (98 kg)   SpO2 97%   BMI 30.99 kg/m  Wt Readings from Last 3 Encounters:  07/31/21 216 lb (98 kg)  07/14/21 213 lb 3.2 oz (96.7 kg)  07/09/21 216 lb (98 kg)     Health Maintenance Due  Topic Date Due   FOOT EXAM  Never done   OPHTHALMOLOGY EXAM  Never done   Hepatitis C Screening  Never done   Zoster Vaccines- Shingrix (1 of  2) Never done   COLONOSCOPY (Pts 45-14yr Insurance coverage will need to be confirmed)  Never done   COVID-19 Vaccine (3 - Moderna risk series) 02/10/2020   INFLUENZA VACCINE  Never done    There are no preventive care reminders to display for this  patient.  Lab Results  Component Value Date   TSH 1.590 07/05/2021   Lab Results  Component Value Date   WBC 7.9 07/16/2021   HGB 13.1 07/16/2021   HCT 42.5 07/16/2021   MCV 88.5 07/16/2021   PLT 186 07/16/2021   Lab Results  Component Value Date   NA 136 07/16/2021   K 4.6 07/16/2021   CHLORIDE 99 04/09/2017   CO2 31 07/16/2021   GLUCOSE 140 (H) 07/16/2021   BUN 18 07/16/2021   CREATININE 1.13 07/16/2021   BILITOT 0.5 07/14/2021   ALKPHOS 41 07/14/2021   AST 29 07/14/2021   ALT 28 07/14/2021   PROT 8.0 07/14/2021   ALBUMIN 4.7 07/14/2021   CALCIUM 8.5 (L) 07/16/2021   ANIONGAP 6 07/16/2021   EGFR 59 (L) 07/05/2021   Lab Results  Component Value Date   CHOL 270 (H) 07/05/2021   Lab Results  Component Value Date   HDL 33 (L) 07/05/2021   Lab Results  Component Value Date   LDLCALC 149 (H) 07/05/2021   Lab Results  Component Value Date   TRIG 465 (H) 07/05/2021   Lab Results  Component Value Date   CHOLHDL 8.3 03/12/2021   Lab Results  Component Value Date   HGBA1C 5.6 07/05/2021      Assessment & Plan:   Problem List Items Addressed This Visit    Visit Diagnoses     Hospital discharge follow-up    -  [facility] CT abdomen reviewed Will check CBC and CMP today   Relevant Orders   CBC with Differential/Platelet   CMP14+EGFR   Status post appendectomy   For acute appendicitis -pain now improved Port sites C/D/I [place] F/u with General surgery    Leukocytosis, unspecified type       Relevant Orders   CBC with Differential/Platelet   Infrarenal abdominal aortic aneurysm (AAA) without rupture Infrarenal abdominal aortic aneurysm measuring up to 4.0 x 3.8 cm with a large burden of eccentric mural thrombus.  Offered to refer to Vascular surgeon Wants to discuss with his Cardiologist - Dr HPercival Spanish   No orders of the defined types were placed in this  encounter.   Follow-up: Return if symptoms worsen or fail to improve.    RLindell Spar MD

## 2021-07-31 NOTE — Patient Instructions (Signed)
[name] , Thank you for taking time to come for your Medicare Wellness Visit. I appreciate your ongoing commitment to your health goals. Please review the following plan we discussed and let me know if I can assist you in the future.   Screening recommendations/referrals: Colonoscopy: patient declined Recommended yearly ophthalmology/optometry visit for glaucoma screening and checkup Recommended yearly dental visit for hygiene and checkup  Vaccinations: Influenza vaccine: due now Pneumococcal vaccine: not due  Tdap vaccine: not due Shingles vaccine: due now     Advanced directives: declined  Conditions/risks identified: hypertension  Next appointment: 1 year   Preventive Care 59 Years and Older, Male Preventive care refers to lifestyle choices and visits with your health care provider that can promote health and wellness. What does preventive care include? A yearly physical exam. This is also called an annual well check. Dental exams once or twice a year. Routine eye exams. Ask your health care provider how often you should have your eyes checked. Personal lifestyle choices, including: Daily care of your teeth and gums. Regular physical activity. Eating a healthy diet. Avoiding tobacco and drug use. Limiting alcohol use. Practicing safe sex. Taking low doses of aspirin every day. Taking vitamin and mineral supplements as recommended by your health care provider. What happens during an annual well check? The services and screenings done by your health care provider during your annual well check will depend on your age, overall health, lifestyle risk factors, and family history of disease. Counseling  Your health care provider may ask you questions about your: Alcohol use. Tobacco use. Drug use. Emotional well-being. Home and relationship well-being. Sexual activity. Eating habits. History of falls. Memory and ability to understand (cognition). Work and work  Statistician. Screening  You may have the following tests or measurements: Height, weight, and BMI. Blood pressure. Lipid and cholesterol levels. These may be checked every 5 years, or more frequently if you are over 37 years old. Skin check. Lung cancer screening. You may have this screening every year starting at age 73 if you have a 30-pack-year history of smoking and currently smoke or have quit within the past 15 years. Fecal occult blood test (FOBT) of the stool. You may have this test every year starting at age 76. Flexible sigmoidoscopy or colonoscopy. You may have a sigmoidoscopy every 5 years or a colonoscopy every 10 years starting at age 61. Prostate cancer screening. Recommendations will vary depending on your family history and other risks. Hepatitis C blood test. Hepatitis B blood test. Sexually transmitted disease (STD) testing. Diabetes screening. This is done by checking your blood sugar (glucose) after you have not eaten for a while (fasting). You may have this done every 1-3 years. Abdominal aortic aneurysm (AAA) screening. You may need this if you are a current or former smoker. Osteoporosis. You may be screened starting at age 82 if you are at high risk. Talk with your health care provider about your test results, treatment options, and if necessary, the need for more tests. Vaccines  Your health care provider may recommend certain vaccines, such as: Influenza vaccine. This is recommended every year. Tetanus, diphtheria, and acellular pertussis (Tdap, Td) vaccine. You may need a Td booster every 10 years. Zoster vaccine. You may need this after age 4. Pneumococcal 13-valent conjugate (PCV13) vaccine. One dose is recommended after age 31. Pneumococcal polysaccharide (PPSV23) vaccine. One dose is recommended after age 87. Talk to your health care provider about which screenings and vaccines you need and how  often you need them. This information is not intended to replace  advice given to you by your health care provider. Make sure you discuss any questions you have with your health care provider. Document Released: 10/13/2015 Document Revised: 06/05/2016 Document Reviewed: 07/18/2015 Elsevier Interactive Patient Education  2017 [place] Prevention in the Home Falls can cause injuries. They can happen to people of all ages. There are many things you can do to make your home safe and to help prevent falls. What can I do on the outside of my home? Regularly fix the edges of walkways and driveways and fix any cracks. Remove anything that might make you trip as you walk through a door, such as a raised step or threshold. Trim any bushes or trees on the path to your home. Use bright outdoor lighting. Clear any walking paths of anything that might make someone trip, such as rocks or tools. Regularly check to see if handrails are loose or broken. Make sure that both sides of any steps have handrails. Any raised decks and porches should have guardrails on the edges. Have any leaves, snow, or ice cleared regularly. Use sand or salt on walking paths during winter. Clean up any spills in your garage right away. This includes oil or grease spills. What can I do in the bathroom? Use night lights. Install grab bars by the toilet and in the tub and shower. Do not use towel bars as grab bars. Use non-skid mats or decals in the tub or shower. If you need to sit down in the shower, use a plastic, non-slip stool. Keep the floor dry. Clean up any water that spills on the floor as soon as it happens. Remove soap buildup in the tub or shower regularly. Attach bath mats securely with double-sided non-slip rug tape. Do not have throw rugs and other things on the floor that can make you trip. What can I do in the bedroom? Use night lights. Make sure that you have a light by your bed that is easy to reach. Do not use any sheets or blankets that are too big for your bed.  They should not hang down onto the floor. Have a firm chair that has side arms. You can use this for support while you get dressed. Do not have throw rugs and other things on the floor that can make you trip. What can I do in the kitchen? Clean up any spills right away. Avoid walking on wet floors. Keep items that you use a lot in easy-to-reach places. If you need to reach something above you, use a strong step stool that has a grab bar. Keep electrical cords out of the way. Do not use floor polish or wax that makes floors slippery. If you must use wax, use non-skid floor wax. Do not have throw rugs and other things on the floor that can make you trip. What can I do with my stairs? Do not leave any items on the stairs. Make sure that there are handrails on both sides of the stairs and use them. Fix handrails that are broken or loose. Make sure that handrails are as long as the stairways. Check any carpeting to make sure that it is firmly attached to the stairs. Fix any carpet that is loose or worn. Avoid having throw rugs at the top or bottom of the stairs. If you do have throw rugs, attach them to the floor with carpet tape. Make sure that you have a  light switch at the top of the stairs and the bottom of the stairs. If you do not have them, ask someone to add them for you. What else can I do to help prevent falls? Wear shoes that: Do not have high heels. Have rubber bottoms. Are comfortable and fit you well. Are closed at the toe. Do not wear sandals. If you use a stepladder: Make sure that it is fully opened. Do not climb a closed stepladder. Make sure that both sides of the stepladder are locked into place. Ask someone to hold it for you, if possible. Clearly mark and make sure that you can see: Any grab bars or handrails. First and last steps. Where the edge of each step is. Use tools that help you move around (mobility aids) if they are needed. These  include: Canes. Walkers. Scooters. Crutches. Turn on the lights when you go into a dark area. Replace any light bulbs as soon as they burn out. Set up your furniture so you have a clear path. Avoid moving your furniture around. If any of your floors are uneven, fix them. If there are any pets around you, be aware of where they are. Review your medicines with your doctor. Some medicines can make you feel dizzy. This can increase your chance of falling. Ask your doctor what other things that you can do to help prevent falls. This information is not intended to replace advice given to you by your health care provider. Make sure you discuss any questions you have with your health care provider. Document Released: 07/13/2009 Document Revised: 02/22/2016 Document Reviewed: 10/21/2014 Elsevier Interactive Patient Education  2017 [employer].

## 2021-07-31 NOTE — Patient Instructions (Signed)
Please continue taking medications as prescribed.  Please continue to eat at regular intervals and ambulate as tolerated.

## 2021-08-01 ENCOUNTER — Ambulatory Visit (INDEPENDENT_AMBULATORY_CARE_PROVIDER_SITE_OTHER): Payer: Medicare Other | Admitting: *Deleted

## 2021-08-01 DIAGNOSIS — I69351 Hemiplegia and hemiparesis following cerebral infarction affecting right dominant side: Secondary | ICD-10-CM | POA: Diagnosis not present

## 2021-08-01 DIAGNOSIS — J439 Emphysema, unspecified: Secondary | ICD-10-CM | POA: Diagnosis not present

## 2021-08-01 DIAGNOSIS — C3491 Malignant neoplasm of unspecified part of right bronchus or lung: Secondary | ICD-10-CM | POA: Diagnosis not present

## 2021-08-01 DIAGNOSIS — Z48815 Encounter for surgical aftercare following surgery on the digestive system: Secondary | ICD-10-CM | POA: Diagnosis not present

## 2021-08-01 DIAGNOSIS — I69311 Memory deficit following cerebral infarction: Secondary | ICD-10-CM | POA: Diagnosis not present

## 2021-08-01 DIAGNOSIS — I63539 Cerebral infarction due to unspecified occlusion or stenosis of unspecified posterior cerebral artery: Secondary | ICD-10-CM

## 2021-08-01 DIAGNOSIS — I480 Paroxysmal atrial fibrillation: Secondary | ICD-10-CM | POA: Diagnosis not present

## 2021-08-01 DIAGNOSIS — Z5181 Encounter for therapeutic drug level monitoring: Secondary | ICD-10-CM | POA: Diagnosis not present

## 2021-08-01 LAB — CBC WITH DIFFERENTIAL/PLATELET
Basophils Absolute: 0 10*3/uL (ref 0.0–0.2)
Basos: 1 %
EOS (ABSOLUTE): 0.1 10*3/uL (ref 0.0–0.4)
Eos: 2 %
Hematocrit: 48.9 % (ref 37.5–51.0)
Hemoglobin: 15.8 g/dL (ref 13.0–17.7)
Immature Grans (Abs): 0 10*3/uL (ref 0.0–0.1)
Immature Granulocytes: 0 %
Lymphocytes Absolute: 1.1 10*3/uL (ref 0.7–3.1)
Lymphs: 17 %
MCH: 26.8 pg (ref 26.6–33.0)
MCHC: 32.3 g/dL (ref 31.5–35.7)
MCV: 83 fL (ref 79–97)
Monocytes Absolute: 0.8 10*3/uL (ref 0.1–0.9)
Monocytes: 12 %
Neutrophils Absolute: 4.3 10*3/uL (ref 1.4–7.0)
Neutrophils: 68 %
Platelets: 216 10*3/uL (ref 150–450)
RBC: 5.9 x10E6/uL — ABNORMAL HIGH (ref 4.14–5.80)
RDW: 14.1 % (ref 11.6–15.4)
WBC: 6.4 10*3/uL (ref 3.4–10.8)

## 2021-08-01 LAB — CMP14+EGFR
ALT: 23 IU/L (ref 0–44)
AST: 22 IU/L (ref 0–40)
Albumin/Globulin Ratio: 1.9 (ref 1.2–2.2)
Albumin: 4.7 g/dL (ref 3.7–4.7)
Alkaline Phosphatase: 45 IU/L (ref 44–121)
BUN/Creatinine Ratio: 12 (ref 10–24)
BUN: 16 mg/dL (ref 8–27)
Bilirubin Total: 0.4 mg/dL (ref 0.0–1.2)
CO2: 27 mmol/L (ref 20–29)
Calcium: 9.7 mg/dL (ref 8.6–10.2)
Chloride: 100 mmol/L (ref 96–106)
Creatinine, Ser: 1.37 mg/dL — ABNORMAL HIGH (ref 0.76–1.27)
Globulin, Total: 2.5 g/dL (ref 1.5–4.5)
Glucose: 136 mg/dL — ABNORMAL HIGH (ref 70–99)
Potassium: 5 mmol/L (ref 3.5–5.2)
Sodium: 142 mmol/L (ref 134–144)
Total Protein: 7.2 g/dL (ref 6.0–8.5)
eGFR: 55 mL/min/{1.73_m2} — ABNORMAL LOW (ref >59)

## 2021-08-01 LAB — POCT INR: INR: 3.1 — AB (ref 2.0–3.0)

## 2021-08-01 NOTE — Patient Instructions (Signed)
Take warfarin 1/2 tablet tonight then resume 1 tablet daily Recheck in 1 week. Order given to Juliane Poot RN [facility]

## 2021-08-03 DIAGNOSIS — I69351 Hemiplegia and hemiparesis following cerebral infarction affecting right dominant side: Secondary | ICD-10-CM | POA: Diagnosis not present

## 2021-08-03 DIAGNOSIS — J439 Emphysema, unspecified: Secondary | ICD-10-CM | POA: Diagnosis not present

## 2021-08-03 DIAGNOSIS — Z48815 Encounter for surgical aftercare following surgery on the digestive system: Secondary | ICD-10-CM | POA: Diagnosis not present

## 2021-08-03 DIAGNOSIS — I480 Paroxysmal atrial fibrillation: Secondary | ICD-10-CM | POA: Diagnosis not present

## 2021-08-03 DIAGNOSIS — I69311 Memory deficit following cerebral infarction: Secondary | ICD-10-CM | POA: Diagnosis not present

## 2021-08-03 DIAGNOSIS — C3491 Malignant neoplasm of unspecified part of right bronchus or lung: Secondary | ICD-10-CM | POA: Diagnosis not present

## 2021-08-07 ENCOUNTER — Ambulatory Visit (INDEPENDENT_AMBULATORY_CARE_PROVIDER_SITE_OTHER): Payer: Medicare Other | Admitting: *Deleted

## 2021-08-07 ENCOUNTER — Telehealth: Payer: Self-pay | Admitting: Cardiology

## 2021-08-07 DIAGNOSIS — I63539 Cerebral infarction due to unspecified occlusion or stenosis of unspecified posterior cerebral artery: Secondary | ICD-10-CM

## 2021-08-07 DIAGNOSIS — Z5181 Encounter for therapeutic drug level monitoring: Secondary | ICD-10-CM | POA: Diagnosis not present

## 2021-08-07 LAB — POCT INR: INR: 2.3 (ref 2.0–3.0)

## 2021-08-07 NOTE — Patient Instructions (Signed)
Continue warfarin 1 tablet daily Recheck in office on 08/21/21. Lab was checked by Juliane Poot RN [facility].  Pt discharged from [place] with wife and gave her instructions.  She verbalized understanding.

## 2021-08-07 NOTE — Telephone Encounter (Signed)
Spoke with wife.  Coumadin instructions given.  See coumadin note.

## 2021-08-07 NOTE — Telephone Encounter (Signed)
Wife called in to say that his coumadin level was 2.3

## 2021-08-09 DIAGNOSIS — I69311 Memory deficit following cerebral infarction: Secondary | ICD-10-CM | POA: Diagnosis not present

## 2021-08-09 DIAGNOSIS — C3491 Malignant neoplasm of unspecified part of right bronchus or lung: Secondary | ICD-10-CM | POA: Diagnosis not present

## 2021-08-09 DIAGNOSIS — Z48815 Encounter for surgical aftercare following surgery on the digestive system: Secondary | ICD-10-CM | POA: Diagnosis not present

## 2021-08-09 DIAGNOSIS — I480 Paroxysmal atrial fibrillation: Secondary | ICD-10-CM | POA: Diagnosis not present

## 2021-08-09 DIAGNOSIS — J439 Emphysema, unspecified: Secondary | ICD-10-CM | POA: Diagnosis not present

## 2021-08-09 DIAGNOSIS — I69351 Hemiplegia and hemiparesis following cerebral infarction affecting right dominant side: Secondary | ICD-10-CM | POA: Diagnosis not present

## 2021-08-10 DIAGNOSIS — C3491 Malignant neoplasm of unspecified part of right bronchus or lung: Secondary | ICD-10-CM | POA: Diagnosis not present

## 2021-08-10 DIAGNOSIS — I69351 Hemiplegia and hemiparesis following cerebral infarction affecting right dominant side: Secondary | ICD-10-CM | POA: Diagnosis not present

## 2021-08-10 DIAGNOSIS — I69311 Memory deficit following cerebral infarction: Secondary | ICD-10-CM | POA: Diagnosis not present

## 2021-08-10 DIAGNOSIS — I480 Paroxysmal atrial fibrillation: Secondary | ICD-10-CM | POA: Diagnosis not present

## 2021-08-10 DIAGNOSIS — Z48815 Encounter for surgical aftercare following surgery on the digestive system: Secondary | ICD-10-CM | POA: Diagnosis not present

## 2021-08-10 DIAGNOSIS — J439 Emphysema, unspecified: Secondary | ICD-10-CM | POA: Diagnosis not present

## 2021-08-20 ENCOUNTER — Ambulatory Visit (INDEPENDENT_AMBULATORY_CARE_PROVIDER_SITE_OTHER): Payer: Medicare Other | Admitting: *Deleted

## 2021-08-20 DIAGNOSIS — I639 Cerebral infarction, unspecified: Secondary | ICD-10-CM | POA: Diagnosis not present

## 2021-08-20 DIAGNOSIS — Z5181 Encounter for therapeutic drug level monitoring: Secondary | ICD-10-CM | POA: Diagnosis not present

## 2021-08-20 DIAGNOSIS — I480 Paroxysmal atrial fibrillation: Secondary | ICD-10-CM | POA: Diagnosis not present

## 2021-08-20 LAB — POCT INR: INR: 3.1 — AB (ref 2.0–3.0)

## 2021-08-20 NOTE — Patient Instructions (Signed)
Description   Take 1/2 a tablet of warfarin today Then continue warfarin 1 tablet daily.  Recheck INR in 4 weeks.

## 2021-08-27 NOTE — Progress Notes (Signed)
Cardiology Office Note   Date:  08/29/2021   ID:  Randall Sullivan, DOB [birth year], MRN [id number]  PCP:  Noreene Larsson, NP  Cardiologist:   None Referring:  Noreene Larsson, NP  Chief Complaint  Patient presents with   Atrial Fibrillation       History of Present Illness: Randall Sullivan is a 70 y.o. male who presents for evaluation of coronary calcium .  He is referred by Noreene Larsson, NP    He was found on a CT of his chest to have 2 vessel coronary calcium.  This was done to follow up resection of a lung cancer.  I saw him in 2017.  He had resection of lung cancer then as well and had some atrial fibrillation post surgery.  He had had resection of another lung cancer this time on the other side (right) last year.  I reviewed the records from the hospitalization and with a complicated course.  He had a VATS for blebectomy and wedge resection of the right upper lobe nodule.  This was a non-small cell lung cancer.  He had a large air leak postoperatively and had to have redo VATS.  He reports being in atrial fibrillation transiently but I do not see a discharge summary that corroborates this.    I saw him last year for evaluation of coronary calcium.  I suggested a POET (Plain Old Exercise Treadmill) but he called to cancel this.  This was he thinks canceled because of COVID.  Since I last saw him he was in the hospital with CVA in October.   I reviewed these records for this visit.   CT/MRI/MRI of the head showed acute left large PCA territory infarction.  He had left PCA occlusion at the P1 P2 junction.  Small acute right thalamic infarction.  No hemorrhage or mass-effect.  He did not receive tPA.  Carotid Dopplers demonstrated no ICA stenosis.  Echocardiogram demonstrated an ejection fraction of 50 to 35% grade 1 diastolic dysfunction.   He was treated with warfarin.   He has residual right-sided weakness, some speech problems and short-term memory issues.  He is dizzy.  He walks with a  cane.  He does do some physical therapy and goes out to the stores.  With this he denies any cardiovascular symptoms.  He never feels the atrial fibrillation and I do not see that it was documented during his hospitalization.  He does not have any palpitations, presyncope or syncope.  He denies any chest pressure, neck or arm discomfort.   Past Medical History:  Diagnosis Date   A-fib ([place]) 2017   after last lung surgery patient had a history if Afib documented in hospital    Allergy    Arthritis    back & legs ( knees)   Cancer ([place])    lung   Complication of anesthesia    agitated upon waking from anesth.    Emphysema    History of lung cancer    Hyperlipidemia    Hypertension    Oxygen dependent    2L continuous   Stroke ([place])    Tobacco abuse     Past Surgical History:  Procedure Laterality Date   HERNIA REPAIR Bilateral 4562   umbilical   LAPAROSCOPIC APPENDECTOMY N/A 07/15/2021   Procedure: APPENDECTOMY LAPAROSCOPIC;  Surgeon: Virl Cagey, MD;  Location: [facility];  Service: General;  Laterality: N/A;   VIDEO ASSISTED THORACOSCOPY Right 12/21/2018   Procedure: VIDEO  ASSISTED THORACOSCOPY AND RESECTION OF BLEBS RIGHT LOWER LOBE;  Surgeon: [name], MD;  Location: [place];  Service: Thoracic;  Laterality: Right;   VIDEO ASSISTED THORACOSCOPY (VATS)/ LOBECTOMY Left 08/01/2016   Procedure: VIDEO ASSISTED THORACOSCOPY (VATS)/ LEFT UPPER LOBECTOMY with lymph node sampling and onQ placement;  Surgeon: [name], MD;  Location: [place];  Service: Thoracic;  Laterality: Left;   VIDEO ASSISTED THORACOSCOPY (VATS)/WEDGE RESECTION Right 12/09/2018   Procedure: VIDEO ASSISTED THORACOSCOPY (VATS)/WEDGE RESECTION;  Surgeon: [name], MD;  Location: [place];  Service: Thoracic;  Laterality: Right;   VIDEO BRONCHOSCOPY Bilateral 12/21/2018   Procedure: VIDEO BRONCHOSCOPY;  Surgeon: [name], MD;  Location: [place];  Service: Thoracic;   Laterality: Bilateral;   VIDEO BRONCHOSCOPY WITH ENDOBRONCHIAL NAVIGATION N/A 07/03/2016   Procedure: VIDEO BRONCHOSCOPY WITH ENDOBRONCHIAL NAVIGATION;  Surgeon: [name], MD;  Location: [place];  Service: Thoracic;  Laterality: N/A;   VIDEO BRONCHOSCOPY WITH INSERTION OF INTERBRONCHIAL VALVE (IBV) N/A 12/11/2018   Procedure: VIDEO BRONCHOSCOPY WITH INSERTION OF INTERBRONCHIAL VALVE (IBV);  Surgeon: [name], MD;  Location[facility];  Service: Thoracic;  Laterality: N/A;   VIDEO BRONCHOSCOPY WITH INSERTION OF INTERBRONCHIAL VALVE (IBV) N/A 02/10/2019   Procedure: VIDEO BRONCHOSCOPY WITH REMOVAL OF INTERBRONCHIAL VALVE (IBV);  Surgeon: [name], MD;  Location: [facility] OR;  Service: Thoracic;  Laterality: N/A;     Current Outpatient Medications  Medication Sig Dispense Refill   acetaminophen (TYLENOL) 325 MG tablet Take 2 tablets (650 mg total) by mouth every 6 (six) hours as needed for mild pain (or Fever >/= 101).     albuterol (PROVENTIL) (2.5 MG/3ML) 0.083% nebulizer solution Take 3 mLs (2.5 mg total) by nebulization every 6 (six) hours as needed for wheezing or shortness of breath. 180 mL 3   Cholecalciferol (VITAMIN D3) 125 MCG (5000 UT) CAPS Take 1 capsule (5,000 Units total) by mouth daily. 30 capsule 0   diltiazem (CARDIZEM CD) 120 MG 24 hr capsule Take 1 capsule (120 mg total) by mouth daily. 30 capsule 0   fenofibrate (TRICOR) 145 MG tablet Take 1 tablet (145 mg total) by mouth daily. 90 tablet 1   loratadine (CLARITIN) 10 MG tablet Take 1 tablet (10 mg total) by mouth daily. 90 tablet 0   meclizine (ANTIVERT) 25 MG tablet Take 1 tablet (25 mg total) by mouth 3 (three) times daily as needed for dizziness. 30 tablet 0   metoprolol succinate (TOPROL-XL) 50 MG 24 hr tablet Take 1 tablet (50 mg total) by mouth daily. Take with or immediately following a meal. 90 tablet 1   polyethylene glycol (MIRALAX / GLYCOLAX) 17 g packet Take 17 g by mouth daily as needed for  mild constipation. 14 each 0   pravastatin (PRAVACHOL) 80 MG tablet Take 1 tablet (80 mg total) by mouth daily. 30 tablet 5   tamsulosin (FLOMAX) 0.4 MG CAPS capsule Take 1 capsule (0.4 mg total) by mouth daily. 30 capsule 3   traZODone (DESYREL) 50 MG tablet Take 1 tablet (50 mg total) by mouth at bedtime. 90 tablet 1   warfarin (COUMADIN) 5 MG tablet Take 1 tablet (5 mg total) by mouth daily. 90 tablet 0   OXYGEN Inhale 2-3 L into the lungs continuous. Hx of lung ca, copd, and emphysema     No current facility-administered medications for this visit.    Allergies:   No known allergies    ROS:  Please see the history of  present illness.   Otherwise, review of systems are positive for none.   All other systems are reviewed and negative.    PHYSICAL EXAM: VS:  BP 132/80   Pulse 60   Ht 5\' 10"  (1.778 m)   Wt 216 lb (98 kg)   BMI 30.99 kg/m  , BMI Body mass index is 30.99 kg/m. GENERAL:  Well appearing NECK:  No jugular venous distention, waveform within normal limits, carotid upstroke brisk and symmetric, no bruits, no thyromegaly LUNGS:  Clear to auscultation bilaterally CHEST:  Unremarkable HEART:  PMI not displaced or sustained,S1 and S2 within normal limits, no S3, no S4, no clicks, no rubs, no murmurs ABD:  Flat, positive bowel sounds normal in frequency in pitch, no bruits, no rebound, no guarding, no midline pulsatile mass, no hepatomegaly, no splenomegaly EXT:  2 plus pulses throughout, no edema, no cyanosis no clubbing   EKG:  EKG is not ordered today. The ekg ordered 07/14/2021 demonstrates sinus rhythm, rate 71, axis within normal limits, intervals within normal limits, no acute ST-T wave changes.   Recent Labs: 03/12/2021: B Natriuretic Peptide 40.0 03/14/2021: Magnesium 1.7 07/05/2021: TSH 1.590 07/31/2021: ALT 23; BUN 16; Creatinine, Ser 1.37; Hemoglobin 15.8; Platelets 216; Potassium 5.0; Sodium 142    Lipid Panel    Component Value Date/Time   CHOL 270 (H)  07/05/2021 0957   TRIG 465 (H) 07/05/2021 0957   HDL 33 (L) 07/05/2021 0957   CHOLHDL 8.3 03/12/2021 1725   VLDL UNABLE TO CALCULATE IF TRIGLYCERIDE OVER 400 mg/dL 03/12/2021 1725   LDLCALC 149 (H) 07/05/2021 0957   LDLCALC  05/08/2020 1051     Comment:     . LDL cholesterol not calculated. Triglyceride levels greater than 400 mg/dL invalidate calculated LDL results. . Reference range: <100 . Desirable range <100 mg/dL for primary prevention;   <70 mg/dL for patients with CHD or diabetic patients  with > or = 2 CHD risk factors. [name] LDL-C is now calculated using the Martin-Hopkins  calculation, which is a validated novel method providing  better accuracy than the Friedewald equation in the  estimation of LDL-C.  Cresenciano Genre et al. Annamaria Helling. 6387;564(33): 2061-2068  (http://education.[employer].com/faq/FAQ164)    LDLDIRECT 75.9 03/12/2021 1725      Wt Readings from Last 3 Encounters:  08/29/21 216 lb (98 kg)  07/31/21 216 lb (98 kg)  07/14/21 213 lb 3.2 oz (96.7 kg)      Other studies Reviewed: Additional studies/ records that were reviewed today include: Hospital records Review of the above records demonstrates:  Please see elsewhere in the note.     ASSESSMENT AND PLAN:  ELEVATED CORONARY CALCIUM: He is not having any symptoms.  Given his stroke he would be able to walk on a treadmill.  We will manage this conservatively.   HTN: His blood pressure is at target.  No change in therapy.   DYSLIPIDEMIA: His LDL was elevated and he had his pravastatin increased several weeks ago.  He will come back in January and get a fasting lipid profile.   ATRIAL FIB: He has not had any symptomatic recurrence although this is likely an etiology for stroke so he now is on warfarin lifetime.  He could not afford DOAC.   AAA:   4.0 X 3.8.  This was noted incidentally on the CT.  I will follow-up with abdominal ultrasound in October of next year.    Current medicines are reviewed at  length with the patient today.  The  patient does not have concerns regarding medicines.  The following changes have been made: As above  Labs/ tests ordered today include:   No orders of the defined types were placed in this encounter.    Disposition:   FU with me in 1 year   Signed, Minus Breeding, MD  08/29/2021 1:11 PM    [facility]

## 2021-08-29 ENCOUNTER — Encounter: Payer: Self-pay | Admitting: Cardiology

## 2021-08-29 ENCOUNTER — Other Ambulatory Visit: Payer: Self-pay | Admitting: *Deleted

## 2021-08-29 ENCOUNTER — Other Ambulatory Visit: Payer: Self-pay

## 2021-08-29 ENCOUNTER — Ambulatory Visit (INDEPENDENT_AMBULATORY_CARE_PROVIDER_SITE_OTHER): Payer: Medicare Other | Admitting: Cardiology

## 2021-08-29 VITALS — BP 132/80 | HR 60 | Ht 70.0 in | Wt 216.0 lb

## 2021-08-29 DIAGNOSIS — I7143 Infrarenal abdominal aortic aneurysm, without rupture: Secondary | ICD-10-CM

## 2021-08-29 DIAGNOSIS — E785 Hyperlipidemia, unspecified: Secondary | ICD-10-CM | POA: Diagnosis not present

## 2021-08-29 DIAGNOSIS — Z5181 Encounter for therapeutic drug level monitoring: Secondary | ICD-10-CM | POA: Diagnosis not present

## 2021-08-29 DIAGNOSIS — I480 Paroxysmal atrial fibrillation: Secondary | ICD-10-CM | POA: Diagnosis not present

## 2021-08-29 DIAGNOSIS — R931 Abnormal findings on diagnostic imaging of heart and coronary circulation: Secondary | ICD-10-CM

## 2021-08-29 DIAGNOSIS — I10 Essential (primary) hypertension: Secondary | ICD-10-CM

## 2021-08-29 NOTE — Patient Instructions (Signed)
Medication Instructions:  The current medical regimen is effective;  continue present plan and medications.  *If you need a refill on your cardiac medications before your next appointment, please call your pharmacy*  Lab Work: Please have Lipid panel in Ja[place] 2023.  You  may have this completed at any [facility] location.  If you have labs (blood work) drawn today and your tests are completely normal, you will receive your results only by: [place] (if you have MyChart) OR A paper copy in the mail If you have any lab test that is abnormal or we need to change your treatment, we will call you to review the results.  Testing/Procedures: Your physician has requested that you have an abdominal aorta duplex. During this test, an ultrasound is used to evaluate the aorta. Allow 30 minutes for this exam. Do not eat after midnight the day before and avoid carbonated beverages You will be contacted to be scheduled for this at a later date.  (Due 06/2022)   Follow-Up: At [facility], you and your health needs are our priority.  As part of our continuing mission to provide you with exceptional heart care, we have created designated Provider Care Teams.  These Care Teams include your primary Cardiologist (physician) and Advanced Practice Providers (APPs -  Physician Assistants and Nurse Practitioners) who all work together to provide you with the care you need, when you need it.  We recommend signing up for the patient portal called "MyChart".  Sign up information is provided on this After Visit Summary.  MyChart is used to connect with patients for Virtual Visits (Telemedicine).  Patients are able to view lab/test results, encounter notes, upcoming appointments, etc.  Non-urgent messages can be sent to your provider as well.   To learn more about what you can do with MyChart, go to [email or web address].    Your next appointment:   1 year(s)  The format for your next appointment:   In  Person  Provider:   Minus Breeding, MD    Thank you for choosing [facility]!!

## 2021-09-04 ENCOUNTER — Ambulatory Visit: Payer: Medicare Other | Admitting: Pharmacist

## 2021-09-04 DIAGNOSIS — I10 Essential (primary) hypertension: Secondary | ICD-10-CM

## 2021-09-04 DIAGNOSIS — E1169 Type 2 diabetes mellitus with other specified complication: Secondary | ICD-10-CM

## 2021-09-04 DIAGNOSIS — I639 Cerebral infarction, unspecified: Secondary | ICD-10-CM

## 2021-09-04 DIAGNOSIS — E785 Hyperlipidemia, unspecified: Secondary | ICD-10-CM

## 2021-09-04 DIAGNOSIS — I480 Paroxysmal atrial fibrillation: Secondary | ICD-10-CM

## 2021-09-04 NOTE — Chronic Care Management (AMB) (Signed)
Chronic Care Management Pharmacy Note  09/04/2021 Name:  Locklan Canoy MRN:  [id number] DOB:  07-11-1951  Summary: Patient has not yet met with [facility], but since open enrollment is ending soon, instructed patient to consider meeting with them to explore Medicare part D options and apply for Medicare Extra Help. Patient's wife reports they are able to afford all of his medications for now as she has restarted work. Will close chronic care management case at this time per patient wishes. PCP can re-refer patient at any time for further assistance if necessary. I removed myself from care team and completed care plan and patient goals.   Subjective: Chanan Detwiler is an 70 y.o. year old male who is a primary patient of Noreene Larsson, NP.  The CCM team was consulted for assistance with disease management and care coordination needs.    Engaged with patient's caregiver (wife [name]) by telephone for follow up visit in response to provider referral for pharmacy case management and/or care coordination services.   Consent to Services:  The patient was given information about Chronic Care Management services, agreed to services, and gave verbal consent prior to initiation of services.  Please see initial visit note for detailed documentation.   Patient Care Team: Noreene Larsson, NP as PCP - General (Nurse Practitioner)  Objective:  Lab Results  Component Value Date   CREATININE 1.37 (H) 07/31/2021   CREATININE 1.13 07/16/2021   CREATININE 1.30 (H) 07/15/2021    Lab Results  Component Value Date   HGBA1C 5.6 07/05/2021   Last diabetic Eye exam: No results found for: HMDIABEYEEXA  Last diabetic Foot exam: No results found for: HMDIABFOOTEX      Component Value Date/Time   CHOL 270 (H) 07/05/2021 0957   TRIG 465 (H) 07/05/2021 0957   HDL 33 (L) 07/05/2021 0957   CHOLHDL 8.3 03/12/2021 1725   VLDL UNABLE TO CALCULATE IF TRIGLYCERIDE OVER 400 mg/dL 03/12/2021 1725   LDLCALC 149 (H) 07/05/2021  0957   [place]  05/08/2020 1051     Comment:     . LDL cholesterol not calculated. Triglyceride levels greater than 400 mg/dL invalidate calculated LDL results. . Reference range: <100 . Desirable range <100 mg/dL for primary prevention;   <70 mg/dL for patients with CHD or diabetic patients  with > or = 2 CHD risk factors. [name] LDL-C is now calculated using the Martin-Hopkins  calculation, which is a validated novel method providing  better accuracy than the Friedewald equation in the  estimation of LDL-C.  Cresenciano Genre et al. Annamaria Helling. 4782;956(21): 2061-2068  (http://education.QuestDiagnostics.com/faq/FAQ164)    LDLDIRECT 75.9 03/12/2021 1725    Hepatic Function Latest Ref Rng & Units 07/31/2021 07/14/2021 07/05/2021  Total Protein 6.0 - 8.5 g/dL 7.2 8.0 7.7  Albumin 3.7 - 4.7 g/dL 4.7 4.7 4.8(H)  AST 0 - 40 IU/L $Remov'22 29 26  'nWkvsx$ ALT 0 - 44 IU/L $Remov'23 28 29  'CTZCns$ Alk Phosphatase 44 - 121 IU/L 45 41 48  Total Bilirubin 0.0 - 1.2 mg/dL 0.4 0.5 0.5    Lab Results  Component Value Date/Time   TSH 1.590 07/05/2021 09:57 AM   TSH 0.82 05/08/2020 10:51 AM    CBC Latest Ref Rng & Units 07/31/2021 07/16/2021 07/15/2021  WBC 3.4 - 10.8 x10E3/uL 6.4 7.9 8.2  Hemoglobin 13.0 - 17.7 g/dL 15.8 13.1 14.4  Hematocrit 37.5 - 51.0 % 48.9 42.5 47.3  Platelets 150 - 450 x10E3/uL 216 186 200    Lab Results  Component Value Date/Time   VD25OH 50.6 07/05/2021 09:57 AM   VD25OH 11 (L) 05/08/2020 10:51 AM    Clinical ASCVD: Yes  The ASCVD Risk score (Arnett DK, et al., 2019) failed to calculate for the following reasons:   The patient has a prior MI or stroke diagnosis     Social History   Tobacco Use  Smoking Status Former   Packs/day: 1.00   Years: 40.00   Pack years: 40.00   Types: Cigarettes   Quit date: 05/28/2016   Years since quitting: 5.2  Smokeless Tobacco Never   BP Readings from Last 3 Encounters:  08/29/21 132/80  07/31/21 134/77  07/16/21 (!) 112/59   Pulse Readings from Last 3  Encounters:  08/29/21 60  07/31/21 64  07/16/21 63   Wt Readings from Last 3 Encounters:  08/29/21 216 lb (98 kg)  07/31/21 216 lb (98 kg)  07/14/21 213 lb 3.2 oz (96.7 kg)    Assessment: Review of patient past medical history, allergies, medications, health status, including review of consultants reports, laboratory and other test data, was performed as part of comprehensive evaluation and provision of chronic care management services.   SDOH:  (Social Determinants of Health) assessments and interventions performed:    CCM Care Plan  Allergies  Allergen Reactions   No Known Allergies     Medications Reviewed Today     Reviewed by Beryle Lathe, [facility] (Pharmacist) on 09/04/21 at [place] List Status: <None>   Medication Order Taking? Sig Documenting Provider Last Dose Status Informant  acetaminophen (TYLENOL) 325 MG tablet [id number] Yes Take 2 tablets (650 mg total) by mouth every 6 (six) hours as needed for mild pain (or Fever >/= 101). Cathlyn Parsons, PA-C Taking Active Spouse/Significant Other  albuterol (PROVENTIL) (2.5 MG/3ML) 0.083% nebulizer solution [id number] Yes Take 3 mLs (2.5 mg total) by nebulization every 6 (six) hours as needed for wheezing or shortness of breath. Fayrene Helper, MD Taking Active   Cholecalciferol (VITAMIN D3) 125 MCG (5000 UT) CAPS [id number] Yes Take 1 capsule (5,000 Units total) by mouth daily. Cathlyn Parsons, PA-C Taking Active Spouse/Significant Other  diltiazem (CARDIZEM CD) 120 MG 24 hr capsule [id number] Yes Take 1 capsule (120 mg total) by mouth daily. Lindell Spar, MD Taking Active   fenofibrate (TRICOR) 145 MG tablet [id number] Yes Take 1 tablet (145 mg total) by mouth daily. Noreene Larsson, NP Taking Active Spouse/Significant Other  loratadine (CLARITIN) 10 MG tablet [id number] Yes Take 1 tablet (10 mg total) by mouth daily. Lindell Spar, MD Taking Active Spouse/Significant Other  meclizine (ANTIVERT) 25 MG tablet  [id number] Yes Take 1 tablet (25 mg total) by mouth 3 (three) times daily as needed for dizziness. Lindell Spar, MD Taking Active Spouse/Significant Other  metoprolol succinate (TOPROL-XL) 50 MG 24 hr tablet [id number] Yes Take 1 tablet (50 mg total) by mouth daily. Take with or immediately following a meal. Cathlyn Parsons, PA-C Taking Active Spouse/Significant Other  OXYGEN [id number] Yes Inhale 2-3 L into the lungs continuous. Hx of lung ca, copd, and emphysema [provider] Taking Active Spouse/Significant Other  polyethylene glycol (MIRALAX / GLYCOLAX) 17 g packet [id number] Yes Take 17 g by mouth daily as needed for mild constipation. Cathlyn Parsons, PA-C Taking Active   pravastatin (PRAVACHOL) 80 MG tablet [id number] Yes Take 1 tablet (80 mg total) by mouth daily. Lindell Spar, MD Taking Active Spouse/Significant Other  tamsulosin (FLOMAX) 0.4 MG CAPS capsule  [id number] Yes Take 1 capsule (0.4 mg total) by mouth daily. Lindell Spar, MD Taking Active Spouse/Significant Other  traZODone (DESYREL) 50 MG tablet [id number] Yes Take 1 tablet (50 mg total) by mouth at bedtime. Lindell Spar, MD Taking Active   warfarin (COUMADIN) 5 MG tablet [id number] Yes Take 1 tablet (5 mg total) by mouth daily. 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[place]) 03/14/2021   Right thalamic infarction ([place]) 03/14/2021   Right sided weakness 03/12/2021   Hypokalemia 03/12/2021   Hyperglycemia 03/12/2021   CVA (cerebral vascular accident) ([place]) 03/12/2021   Aortic atherosclerosis ([place])  08/30/2020   Elevated coronary artery calcium score 08/30/2020   Hyperlipidemia 11/17/2019   TSH (thyroid-stimulating hormone deficiency) 11/17/2019   S/P Wedge Resection RUL, Blebectomy RUL 12/09/2018   Hearing loss of right ear 06/04/2018   Impacted cerumen of right ear 06/04/2018   Primary lung adenocarcinoma ([place]) 10/15/2016   PAF (paroxysmal atrial fibrillation) ([place]) 09/16/2016   Encounter for therapeutic drug monitoring 09/16/2016   S/P lobectomy of lung 08/05/2016   Cancer of left lung ([place]) 08/01/2016   Lung nodule 06/11/2016   Former smoker 06/11/2016   Emphysema of lung ([place]) 06/11/2016   Essential (primary) hypertension 10/06/2014    Immunization History  Administered Date(s) Administered   Moderna Sars-Covid-2 Vaccination 12/20/2019, 01/13/2020   Pneumococcal Polysaccharide-23 03/01/2021   Tdap 03/07/2018    Conditions to be addressed/monitored: Atrial Fibrillation, HTN, HLD, and DMII  Care Plan : Medication Management  Updates made by Beryle Lathe, [place] since 09/04/2021 12:00 AM  Completed 09/04/2021   Problem: HLD/CVA, HTN, T2DM, Afib Resolved 09/04/2021  Priority: High  Onset Date: 07/19/2021     Goal: Disease Progression Prevention Completed 09/04/2021  Start Date: 07/19/2021  Expected End Date: 10/17/2021  Recent Progress: On track  Priority: High  Note:   Current Barriers:  Unable to independently afford treatment regimen Unable to achieve control of hyperlipidemia Suboptimal therapeutic regimen for hyperlipidemia  Pharmacist Clinical Goal(s):  Patient will verbalize ability to afford treatment regimen achieve control of hyperlipidemia as evidenced by improved LDL and improved triglycerides through collaboration with PharmD and provider.   Interventions: 1:1 collaboration with Noreene Larsson, NP regarding development and update of comprehensive plan of care as evidenced by provider attestation and co-signature Inter-disciplinary care team  collaboration (see longitudinal plan of care) Comprehensive medication review performed; medication list updated in electronic medical record  Type 2 Diabetes - Condition stable. Not addressed this visit.: Controlled; Most recent A1c at goal of <7% per [name] guidelines Current medications:  none, PCP recently discontinued glimepiride Intolerances: none Taking medications as directed: n/a Side effects thought to be attributed to current medication regimen: n/a Hypoglycemia prevention: not indicated at this time Current meal patterns: not discussed today Current exercise: not discussed today On a statin: yes On aspirin 81 mg daily: no, since he is on blood thinner for Afib Last microalbumin: 6.3; on an ACEi/ARB: no Last eye exam: overdue Last foot exam: overdue Pneumonia vaccine:  PPSV23 in June 2022; if never received PCV  then recommend PCV20 single dose Influenza vaccine:  needs this fall Current glucose readings:  not discussed today Continue to monitor blood glucose and A1c; could consider GLP-1 or SGLT-2 inhibitor due to ASCVD risk independent of A1c; however, cost is prohibitive at this time  Hypertension - Condition stable. Not addressed this visit.: Blood pressure under good control. Blood pressure is at goal of <130/80 mmHg per 2017 AHA/ACC guidelines. Current medications: diltiazem 120 mg by mouth once daily and metoprolol succinate 50 mg by mouth once daily Intolerances: none Taking medications as directed: yes Side effects thought to be attributed to current medication regimen: no Current home blood pressure: not discussed today Continue diltiazem 120 mg by mouth once daily and metoprolol succinate 50 mg by mouth once daily Encourage dietary sodium restriction/DASH diet Recommend home blood pressure monitoring to discuss at next visit Discussed need for medication compliance  Hyperlipidemia/history of stroke (03/12/21) - Condition stable. Not addressed this  visit.: Uncontrolled. LDL above goal of <70 due to very high risk given established clinical ASCVD per 2020 AACE/ACE guidelines. Triglycerides above goal of <150 per 2020 AACE/ACE guidelines. Current medications: pravastatin 80 mg by mouth once daily (recently increased from 40 mg by mouth daily) Intolerances: none Taking medications as directed: yes Side effects thought to be attributed to current medication regimen: no Encourage dietary reduction of high fat containing foods such as butter, nuts, bacon, egg yolks, etc. Discussed need for medication compliance Re-check lipid panel in 4-12 weeks Given clinical ASCVD, would recommend high intensity statin such as rosuvastatin 20 or 40 mg by mouth daily or atorvastatin 40 or 80 mg by mouth daily for secondary prevention. If LDL remains above goal on high intensity statin, would recommend initiation of ezetimibe 10 mg by mouth daily.  For elevated triglycerides, continue fibrate and statin therapy. Consider omega-3 therapy next. Vascepa would be preferred; however, this is cost prohibitive. Could have patient take over the counter fish oil 1-2 of EPA twice daily.   Atrial Fibrillation - Condition stable. Not addressed this visit.: Followed by Dr. Percival Spanish Controlled. Most recent ECG: normal sinus. Current rate control: metoprolol succinate 50 mg by mouth once daily and diltiazem 120 mg by mouth once daily Anticoagulation: warfarin (Coumadin) 5 mg by mouth daily managed by [facility] in [place] CHADS2VASc score: 5 - Age (1 point), Hypertension history (1 point), Stroke/TIA/Thromboembolism history (2 points), and Diabetes history (1 point) Denies signs and symptoms of bleeding Discussed need for medication compliance Continue current management per cardiology  Financial constraints Patient reports cost concerns for his medications. Medication assistance evaluation completed; however, patient is on all generic medications and there are no  applicable assistance programs through the manufacturer. The patient does not currently have Medicare part D. Based on current household size and income, patient should qualify for Medicare Extra Help. Patient instructed to call [facility] before the end of open enrollment to explore Medicare prescription drug plan options as well as see if they can help him apply for Medicare Extra Help.   Patient Goals/Self-Care Activities Patient will:  Take medications as prescribed Check blood pressure at least once daily, document, and provide at future appointments Collaborate with provider on medication access solutions  Follow Up Plan: The patient has been provided with contact information for the care management team and has been advised to call with any health related questions or concerns.       Medication Assistance:  Patient to meet with [facility] to discuss Medicare part D options  Patient's  preferred pharmacy is:  [facility] [address], [place] - [place] [address] [place] [address] Phone: [phone] Fax: [phone]  [id number] [place], [place] [address] Phone: [phone] Fax: [phone]  Follow Up:  Patient requests no follow-up at this time.  Plan: The patient has been provided with contact information for the care management team and has been advised to call with any health related questions or concerns.   Kennon Holter, PharmD, Para [place], CPP Clinical Pharmacist Practitioner [facility] [phone]

## 2021-09-04 NOTE — Patient Instructions (Signed)
Randall Sullivan,  It was great to talk to you today!  Please call me with any questions or concerns.   Visit Information  Following are the goals we discussed today:  Patient Goals/Self-Care Activities Patient will:  Take medications as prescribed Check blood pressure at least once daily, document, and provide at future appointments Collaborate with provider on medication access solutions  Plan: The patient has been provided with contact information for the care management team and has been advised to call with any health related questions or concerns.   Randall Sullivan, PharmD, BCACP, CPP Clinical Pharmacist Practitioner [facility] [phone]   Please call the care guide team at [phone] if you need to cancel or reschedule your appointment.   Patient verbalizes understanding of instructions provided today and agrees to view in Randall Sullivan.    It can be hard choosing a Medicare plan. A group that I always recommend for my patients is called [facility] [employer] [place] [facility]). With this program, they offer free counseling to Medicare beneficiaries and caregivers about Medicare, Medicare supplements, Medicare Advantage, Medicare Part D, and long-term care insurance. [place] counselors are not [employer], and they do not sell or endorse any product, plan, or company. The counselors are volunteers and they always offer unbiased information regarding Medicare health care products.  [facility] has counselors in every county across the state who are trained to be the [place] people for seniors and Medicare beneficiaries in their local communities. Local counseling is done by appointment in each county.   [facility] local counseling team's information can be found below. I recommend that you give them a call and set up an appointment (Ask for Randall Sullivan).    [facility]     [address] [place]  [address] [phone]   For more information: [email or web address] or just google "[place] [facility]"   Based on our conversation today, please contact [name] and have them help you apply for Medicare Extra Help (also known as low income subsidy).

## 2021-09-06 DIAGNOSIS — Z20822 Contact with and (suspected) exposure to covid-19: Secondary | ICD-10-CM | POA: Diagnosis not present

## 2021-09-16 ENCOUNTER — Other Ambulatory Visit: Payer: Self-pay | Admitting: Internal Medicine

## 2021-09-18 ENCOUNTER — Ambulatory Visit (INDEPENDENT_AMBULATORY_CARE_PROVIDER_SITE_OTHER): Payer: Medicare Other | Admitting: *Deleted

## 2021-09-18 ENCOUNTER — Other Ambulatory Visit: Payer: Self-pay | Admitting: Internal Medicine

## 2021-09-18 ENCOUNTER — Other Ambulatory Visit: Payer: Self-pay

## 2021-09-18 DIAGNOSIS — Z5181 Encounter for therapeutic drug level monitoring: Secondary | ICD-10-CM

## 2021-09-18 DIAGNOSIS — I480 Paroxysmal atrial fibrillation: Secondary | ICD-10-CM

## 2021-09-18 DIAGNOSIS — I639 Cerebral infarction, unspecified: Secondary | ICD-10-CM

## 2021-09-18 LAB — POCT INR: INR: 3.7 — AB (ref 2.0–3.0)

## 2021-09-18 NOTE — Patient Instructions (Signed)
Hold warfarin tonight then decrease dose to 1 tablet daily except 1/2 tablet on Fridays Recheck INR in 3 weeks.

## 2021-09-28 ENCOUNTER — Encounter: Payer: Self-pay | Admitting: Internal Medicine

## 2021-10-01 ENCOUNTER — Other Ambulatory Visit: Payer: Self-pay | Admitting: *Deleted

## 2021-10-01 MED ORDER — METOPROLOL SUCCINATE ER 50 MG PO TB24: 50.0000 mg | ORAL_TABLET | Freq: Every day | ORAL | 1 refills | Status: DC

## 2021-10-09 ENCOUNTER — Ambulatory Visit (INDEPENDENT_AMBULATORY_CARE_PROVIDER_SITE_OTHER): Payer: Medicare Other | Admitting: *Deleted

## 2021-10-09 DIAGNOSIS — Z5181 Encounter for therapeutic drug level monitoring: Secondary | ICD-10-CM | POA: Diagnosis not present

## 2021-10-09 DIAGNOSIS — I480 Paroxysmal atrial fibrillation: Secondary | ICD-10-CM | POA: Diagnosis not present

## 2021-10-09 DIAGNOSIS — I639 Cerebral infarction, unspecified: Secondary | ICD-10-CM | POA: Diagnosis not present

## 2021-10-09 LAB — POCT INR: INR: 3.1 — AB (ref 2.0–3.0)

## 2021-10-09 NOTE — Patient Instructions (Signed)
Decrease warfarin to 1 tablet daily except 1/2 tablet on Tuesdays and Fridays Recheck INR in 3 weeks.

## 2021-10-10 ENCOUNTER — Encounter (HOSPITAL_COMMUNITY): Payer: Self-pay | Admitting: General Surgery

## 2021-10-17 ENCOUNTER — Other Ambulatory Visit: Payer: Self-pay | Admitting: Internal Medicine

## 2021-10-17 ENCOUNTER — Encounter: Payer: Self-pay | Admitting: Internal Medicine

## 2021-10-17 ENCOUNTER — Other Ambulatory Visit: Payer: Self-pay | Admitting: *Deleted

## 2021-10-17 DIAGNOSIS — I480 Paroxysmal atrial fibrillation: Secondary | ICD-10-CM

## 2021-10-17 DIAGNOSIS — Z9109 Other allergy status, other than to drugs and biological substances: Secondary | ICD-10-CM

## 2021-10-17 DIAGNOSIS — Z8673 Personal history of transient ischemic attack (TIA), and cerebral infarction without residual deficits: Secondary | ICD-10-CM

## 2021-10-17 MED ORDER — WARFARIN SODIUM 5 MG PO TABS: 5.0000 mg | ORAL_TABLET | Freq: Every day | ORAL | 0 refills | Status: DC

## 2021-10-17 MED ORDER — LORATADINE 10 MG PO TABS: 10.0000 mg | ORAL_TABLET | Freq: Every day | ORAL | 0 refills | Status: DC

## 2021-10-17 MED ORDER — DILTIAZEM HCL ER COATED BEADS 120 MG PO CP24: 120.0000 mg | ORAL_CAPSULE | Freq: Every day | ORAL | 0 refills | Status: DC

## 2021-10-18 ENCOUNTER — Other Ambulatory Visit: Payer: Self-pay | Admitting: Internal Medicine

## 2021-10-18 DIAGNOSIS — Z9109 Other allergy status, other than to drugs and biological substances: Secondary | ICD-10-CM

## 2021-10-30 ENCOUNTER — Ambulatory Visit (INDEPENDENT_AMBULATORY_CARE_PROVIDER_SITE_OTHER): Payer: Medicare Other | Admitting: *Deleted

## 2021-10-30 DIAGNOSIS — I480 Paroxysmal atrial fibrillation: Secondary | ICD-10-CM

## 2021-10-30 DIAGNOSIS — Z5181 Encounter for therapeutic drug level monitoring: Secondary | ICD-10-CM

## 2021-10-30 DIAGNOSIS — I639 Cerebral infarction, unspecified: Secondary | ICD-10-CM

## 2021-10-30 LAB — POCT INR: INR: 2.7 (ref 2.0–3.0)

## 2021-10-30 NOTE — Patient Instructions (Signed)
Continue warfarin 1 tablet daily except 1/2 tablet on Tuesdays and Fridays Recheck INR in 4 weeks.

## 2021-11-07 ENCOUNTER — Other Ambulatory Visit: Payer: Self-pay | Admitting: Internal Medicine

## 2021-11-07 DIAGNOSIS — R351 Nocturia: Secondary | ICD-10-CM

## 2021-11-07 DIAGNOSIS — N401 Enlarged prostate with lower urinary tract symptoms: Secondary | ICD-10-CM

## 2021-11-09 ENCOUNTER — Ambulatory Visit (INDEPENDENT_AMBULATORY_CARE_PROVIDER_SITE_OTHER): Payer: Medicare Other | Admitting: Internal Medicine

## 2021-11-09 ENCOUNTER — Encounter: Payer: Self-pay | Admitting: Internal Medicine

## 2021-11-09 ENCOUNTER — Ambulatory Visit: Payer: Medicare Other | Admitting: Nurse Practitioner

## 2021-11-09 ENCOUNTER — Other Ambulatory Visit: Payer: Self-pay

## 2021-11-09 VITALS — BP 138/88 | HR 69 | Resp 18 | Ht 70.0 in | Wt 221.0 lb

## 2021-11-09 DIAGNOSIS — E785 Hyperlipidemia, unspecified: Secondary | ICD-10-CM | POA: Diagnosis not present

## 2021-11-09 DIAGNOSIS — G4700 Insomnia, unspecified: Secondary | ICD-10-CM | POA: Diagnosis not present

## 2021-11-09 DIAGNOSIS — Z1211 Encounter for screening for malignant neoplasm of colon: Secondary | ICD-10-CM | POA: Diagnosis not present

## 2021-11-09 DIAGNOSIS — I6930 Unspecified sequelae of cerebral infarction: Secondary | ICD-10-CM | POA: Insufficient documentation

## 2021-11-09 DIAGNOSIS — Z2821 Immunization not carried out because of patient refusal: Secondary | ICD-10-CM

## 2021-11-09 DIAGNOSIS — H53453 Other localized visual field defect, bilateral: Secondary | ICD-10-CM | POA: Insufficient documentation

## 2021-11-09 DIAGNOSIS — E1169 Type 2 diabetes mellitus with other specified complication: Secondary | ICD-10-CM

## 2021-11-09 DIAGNOSIS — I480 Paroxysmal atrial fibrillation: Secondary | ICD-10-CM

## 2021-11-09 DIAGNOSIS — Z9049 Acquired absence of other specified parts of digestive tract: Secondary | ICD-10-CM | POA: Insufficient documentation

## 2021-11-09 MED ORDER — TRAZODONE HCL 100 MG PO TABS: 100.0000 mg | ORAL_TABLET | Freq: Every day | ORAL | 1 refills | Status: DC

## 2021-11-09 NOTE — Assessment & Plan Note (Signed)
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

## 2021-11-09 NOTE — Progress Notes (Signed)
Established Patient Office Visit  Subjective:  Patient ID: Randall Sullivan, male    DOB: 1950-12-30  Age: 71 y.o. MRN: [id number]  CC:  Chief Complaint  Patient presents with   Follow-up    4 month follow up pt still having right side abdomen pain had appendectomy oct doesn't know if it has anything to do with that     HPI Randall Sullivan is a 71 y.o. male with past medical history of HTN, PAF, CVA, lung cancer s/p lobectomy, HLD and insomnia who presents for f/u of his chronic medical conditions.  He still complains of peripheral vision loss and mild dizziness at times since the CVA.  Denies any acute change in vision since the last visit.  He still has not seen any ophthalmologist yet.  He takes Coumadin for PAF and takes statin for HLD.  Denies any chest pain, dyspnea or palpitations currently.  Denies any recent change in sensation over UE or LE.  He has stopped taking glimepiride now.  His blood glucose ranges around 120.  Denies any polyuria or polydipsia.  He complains of tingling sensation over RLQ abdominal area since appendectomy.  Denies any dull ache, dysuria, hematuria, or any change in BM.  He complains of insomnia.  Of note, his wife reports that he takes daytime naps.  He tries to go to bed at 7 PM and stays awake till 3 AM, when his wife goes to work.  He goes to bed and sleeps well after 3 AM.  He denies any anhedonia, spells of anxiety, SI or HI currently.   Past Medical History:  Diagnosis Date   A-fib [facility]) 2017   after last lung surgery patient had a history if Afib documented in hospital    Acute appendicitis 07/14/2021   Allergy    Arthritis    back & legs ( knees)   Cancer (HCC)    lung   Complication of anesthesia    agitated upon waking from anesth.    Emphysema    History of lung cancer    Hyperlipidemia    Hypertension    Oxygen dependent    2L continuous   Stroke ([place])    Tobacco abuse     Past Surgical History:  Procedure Laterality Date   HERNIA  REPAIR Bilateral 2263   umbilical   LAPAROSCOPIC APPENDECTOMY N/A 07/15/2021   Procedure: APPENDECTOMY LAPAROSCOPIC;  Surgeon: Virl Cagey, MD;  Location: [facility];  Service: General;  Laterality: N/A;   VIDEO ASSISTED THORACOSCOPY Right 12/21/2018   Procedure: VIDEO ASSISTED THORACOSCOPY AND RESECTION OF BLEBS RIGHT LOWER LOBE;  Surgeon: [name], MD;  Location: [place];  Service: Thoracic;  Laterality: Right;   VIDEO ASSISTED THORACOSCOPY (VATS)/ LOBECTOMY Left 08/01/2016   Procedure: VIDEO ASSISTED THORACOSCOPY (VATS)/ LEFT UPPER LOBECTOMY with lymph node sampling and onQ placement;  Surgeon: [name], MD;  Location: [place];  Service: Thoracic;  Laterality: Left;   VIDEO ASSISTED THORACOSCOPY (VATS)/WEDGE RESECTION Right 12/09/2018   Procedure: VIDEO ASSISTED THORACOSCOPY (VATS)/WEDGE RESECTION;  Surgeon: [name], MD;  Location: [place];  Service: Thoracic;  Laterality: Right;   VIDEO BRONCHOSCOPY Bilateral 12/21/2018   Procedure: VIDEO BRONCHOSCOPY;  Surgeon: [name], MD;  Location: [place];  Service: Thoracic;  Laterality: Bilateral;   VIDEO BRONCHOSCOPY WITH ENDOBRONCHIAL NAVIGATION N/A 07/03/2016   Procedure: VIDEO BRONCHOSCOPY WITH ENDOBRONCHIAL NAVIGATION;  Surgeon: [name], MD;  Location: [place];  Service: Thoracic;  Laterality: N/A;  VIDEO BRONCHOSCOPY WITH INSERTION OF INTERBRONCHIAL VALVE (IBV) N/A 12/11/2018   Procedure: VIDEO BRONCHOSCOPY WITH INSERTION OF INTERBRONCHIAL VALVE (IBV);  Surgeon: [name], MD;  Location: [facility] OR;  Service: Thoracic;  Laterality: N/A;   VIDEO BRONCHOSCOPY WITH INSERTION OF INTERBRONCHIAL VALVE (IBV) N/A 02/10/2019   Procedure: VIDEO BRONCHOSCOPY WITH REMOVAL OF INTERBRONCHIAL VALVE (IBV);  Surgeon: [name], MD;  Location: [facility] OR;  Service: Thoracic;  Laterality: N/A;    Family History  Problem Relation Age of Onset   Cancer Mother    Cancer Sister    Aneurysm  Sister    Kidney disease Paternal Aunt    Asthma Brother    Cancer - Lung Brother    Cancer Brother     Social History   Socioeconomic History   Marital status: Married    Spouse name: Not on file   Number of children: 2   Years of education: Not on file   Highest education level: Not on file  Occupational History   Occupation: Vinyl siding   Occupation: Retired  Tobacco Use   Smoking status: Former    Packs/day: 1.00    Years: 40.00    Pack years: 40.00    Types: Cigarettes    Quit date: 05/28/2016    Years since quitting: 5.4   Smokeless tobacco: Never  Vaping Use   Vaping Use: Never used  Substance and Sexual Activity   Alcohol use: Not Currently    Comment: quit- 2016   Drug use: No   Sexual activity: Not Currently  Other Topics Concern   Not on file  Social History Narrative   Married for 2 years,second.Lives with wife. 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   Social Determinants of Health   Financial Resource Strain: Not on file  Food Insecurity: Not on file  Transportation Needs: No Transportation Needs   Lack of Transportation (Medical): No   Lack of Transportation (Non-Medical): No  Physical Activity: Insufficiently Active   Days of Exercise per Week: 7 days   Minutes of Exercise per Session: 20 min  Stress: Not on file  Social Connections: Moderately Isolated   Frequency of Communication with Friends and Family: More than three times a week   Frequency of Social Gatherings with Friends and Family: Once a week   Attends Religious Services: Never   Marine scientist or Organizations: No   Attends Music therapist: Never   Marital Status: Married  Human resources officer Violence: Not At Risk   Fear of Current or Ex-Partner: No   Emotionally Abused: No   Physically Abused: No   Sexually Abused: No    Outpatient Medications Prior to Visit  Medication Sig Dispense Refill   acetaminophen (TYLENOL) 325 MG tablet Take 2 tablets (650 mg  total) by mouth every 6 (six) hours as needed for mild pain (or Fever >/= 101).     albuterol (PROVENTIL) (2.5 MG/3ML) 0.083% nebulizer solution Take 3 mLs (2.5 mg total) by nebulization every 6 (six) hours as needed for wheezing or shortness of breath. 180 mL 3   Cholecalciferol (VITAMIN D3) 125 MCG (5000 UT) CAPS Take 1 capsule (5,000 Units total) by mouth daily. 30 capsule 0   diltiazem (CARDIZEM CD) 120 MG 24 hr capsule Take 1 capsule (120 mg total) by mouth daily. 30 capsule 0   fenofibrate (TRICOR) 145 MG tablet Take 1 tablet (145 mg total) by mouth daily. 90 tablet 1   loratadine (CLARITIN) 10 MG tablet Take 1 tablet (10  mg total) by mouth daily. 90 tablet 0   meclizine (ANTIVERT) 25 MG tablet Take 1 tablet (25 mg total) by mouth 3 (three) times daily as needed for dizziness. 30 tablet 0   metoprolol succinate (TOPROL-XL) 50 MG 24 hr tablet Take 1 tablet (50 mg total) by mouth daily. Take with or immediately following a meal. 90 tablet 1   OXYGEN Inhale 2-3 L into the lungs continuous. Hx of lung ca, copd, and emphysema     polyethylene glycol (MIRALAX / GLYCOLAX) 17 g packet Take 17 g by mouth daily as needed for mild constipation. 14 each 0   pravastatin (PRAVACHOL) 80 MG tablet Take 1 tablet (80 mg total) by mouth daily. 30 tablet 5   tamsulosin (FLOMAX) 0.4 MG CAPS capsule Take 1 capsule by mouth once daily 90 capsule 3   warfarin (COUMADIN) 5 MG tablet Take 1 tablet (5 mg total) by mouth daily. 90 tablet 0   traZODone (DESYREL) 50 MG tablet Take 1 tablet (50 mg total) by mouth at bedtime. 90 tablet 1   No facility-administered medications prior to visit.    Allergies  Allergen Reactions   No Known Allergies     ROS Review of Systems  Constitutional:  Positive for fatigue. Negative for chills and fever.  HENT:  Negative for congestion, sinus pressure and sinus pain.   Eyes:  Positive for visual disturbance. Negative for redness.  Respiratory:  Negative for cough and shortness  of breath.   Cardiovascular:  Negative for chest pain and palpitations.  Gastrointestinal:  Negative for diarrhea, nausea and vomiting.  Genitourinary:  Negative for dysuria and hematuria.  Musculoskeletal:  Negative for neck pain and neck stiffness.  Skin:  Negative for rash.  Neurological:  Negative for tremors, seizures, syncope and headaches.  Psychiatric/Behavioral:  Positive for sleep disturbance. Negative for agitation and behavioral problems.      Objective:    Physical Exam Vitals reviewed.  Constitutional:      General: He is not in acute distress.    Appearance: He is not diaphoretic.  HENT:     Head: Normocephalic and atraumatic.     Nose: Nose normal.     Mouth/Throat:     Mouth: Mucous membranes are moist.  Eyes:     General: No scleral icterus.    Extraocular Movements: Extraocular movements intact.  Cardiovascular:     Rate and Rhythm: Normal rate and regular rhythm.     Pulses: Normal pulses.     Heart sounds: Normal heart sounds. No murmur heard. Pulmonary:     Breath sounds: Normal breath sounds. No wheezing or rales.  Abdominal:     Palpations: Abdomen is soft.     Tenderness: There is no abdominal tenderness. There is no right CVA tenderness, left CVA tenderness, guarding or rebound.  Musculoskeletal:     Cervical back: Neck supple. No tenderness.     Right lower leg: No edema.     Left lower leg: No edema.  Skin:    General: Skin is warm.     Findings: No rash.  Neurological:     General: No focal deficit present.     Mental Status: He is alert and oriented to person, place, and time. Mental status is at baseline.     Sensory: No sensory deficit.  Psychiatric:        Mood and Affect: Mood normal.        Behavior: Behavior normal.    BP 138/88 (BP Location:  Right Arm, Patient Position: Sitting, Cuff Size: Normal)    Pulse 69    Resp 18    Ht $R'5\' 10"'vj$  (1.778 m)    Wt 221 lb 0.6 oz (100.3 kg)    SpO2 94%    BMI 31.72 kg/m  Wt Readings from Last 3  Encounters:  11/09/21 221 lb 0.6 oz (100.3 kg)  08/29/21 216 lb (98 kg)  07/31/21 216 lb (98 kg)    Lab Results  Component Value Date   TSH 1.590 07/05/2021   Lab Results  Component Value Date   WBC 6.4 07/31/2021   HGB 15.8 07/31/2021   HCT 48.9 07/31/2021   MCV 83 07/31/2021   PLT 216 07/31/2021   Lab Results  Component Value Date   NA 142 07/31/2021   K 5.0 07/31/2021   CHLORIDE 99 04/09/2017   CO2 27 07/31/2021   GLUCOSE 136 (H) 07/31/2021   BUN 16 07/31/2021   CREATININE 1.37 (H) 07/31/2021   BILITOT 0.4 07/31/2021   ALKPHOS 45 07/31/2021   AST 22 07/31/2021   ALT 23 07/31/2021   PROT 7.2 07/31/2021   ALBUMIN 4.7 07/31/2021   CALCIUM 9.7 07/31/2021   ANIONGAP 6 07/16/2021   EGFR 55 (L) 07/31/2021   Lab Results  Component Value Date   CHOL 270 (H) 07/05/2021   Lab Results  Component Value Date   HDL 33 (L) 07/05/2021   Lab Results  Component Value Date   LDLCALC 149 (H) 07/05/2021   Lab Results  Component Value Date   TRIG 465 (H) 07/05/2021   Lab Results  Component Value Date   CHOLHDL 8.3 03/12/2021   Lab Results  Component Value Date   HGBA1C 5.6 07/05/2021      Assessment & Plan:   Problem List Items Addressed This Visit       Cardiovascular and Mediastinum   PAF (paroxysmal atrial fibrillation) ([place])    On duty has an Coumadin Followed by cardiology and [facility] clinic        Endocrine   Diabetes mellitus ([place]) - Primary   Relevant Orders   Hemoglobin A1c     Other   Hyperlipidemia    Lipid profile reviewed Increased Pravastatin dose to 80 mg QD Would prefer to add Vascepa, but cost is a limiting factor currently - had referred to [facility] for medication assistance, but he does not have prescription coverage, so was not much helpful.      Relevant Orders   Lipid Profile   Insomnia    Likely due to his sleeping habits Increased trazodone to 100 mg nightly as needed for now Sleep hygiene discussed, material provided       Relevant Medications   traZODone (DESYREL) 100 MG tablet   History of CVA with residual deficit    Has peripheral vision loss and memory changes since the stroke On Coumadin and statin currently      Decreased peripheral vision of both eyes    Referred to ophthalmology -has had peripheral vision loss since CVA, no acute change      Relevant Orders   Ambulatory referral to Ophthalmology   Other Visit Diagnoses     Influenza vaccine refused       Special screening for malignant neoplasms, colon       Relevant Orders   Cologuard       Meds ordered this encounter  Medications   traZODone (DESYREL) 100 MG tablet    Sig: Take 1 tablet (100 mg total)  by mouth at bedtime.    Dispense:  90 tablet    Refill:  1    Follow-up: Return in about 4 months (around 03/09/2022) for DM and insomnia.    Lindell Spar, MD

## 2021-11-09 NOTE — Assessment & Plan Note (Signed)
On duty has an Coumadin Followed by cardiology and [facility] clinic

## 2021-11-09 NOTE — Patient Instructions (Signed)
Please start taking Trazodone 100 mg instead of 50 mg.  Please maintain simple sleep hygiene. - Maintain dark and non-noisy environment in the bedroom. - Please use the bedroom for sleep and sexual activity only. - Do not use electronic devices in the bedroom. - Please take dinner at least 2 hours before bedtime. - Please avoid caffeinated products in the evening, including coffee, soft drinks. - Please try to maintain the regular sleep-wake cycle - Go to bed and wake up at the same time.  Please continue to take other medications as prescribed.  You are being referred to Ophthalmology.

## 2021-11-09 NOTE — Assessment & Plan Note (Signed)
Lipid profile reviewed Increased Pravastatin dose to 80 mg QD Would prefer to add Vascepa, but cost is a limiting factor currently - had referred to [facility] for medication assistance, but he does not have prescription coverage, so was not much helpful.

## 2021-11-09 NOTE — Assessment & Plan Note (Signed)
Has peripheral vision loss and memory changes since the stroke On Coumadin and statin currently

## 2021-11-09 NOTE — Assessment & Plan Note (Signed)
Referred to ophthalmology -has had peripheral vision loss since CVA, no acute change

## 2021-11-09 NOTE — Assessment & Plan Note (Signed)
Likely due to his sleeping habits Increased trazodone to 100 mg nightly as needed for now Sleep hygiene discussed, material provided

## 2021-11-10 LAB — LIPID PANEL
Chol/HDL Ratio: 6 ratio — ABNORMAL HIGH (ref 0.0–5.0)
Cholesterol, Total: 193 mg/dL (ref 100–199)
HDL: 32 mg/dL — ABNORMAL LOW (ref >39)
LDL Chol Calc (NIH): 103 mg/dL — ABNORMAL HIGH (ref 0–99)
Triglycerides: 342 mg/dL — ABNORMAL HIGH (ref 0–149)
VLDL Cholesterol Cal: 58 mg/dL — ABNORMAL HIGH (ref 5–40)

## 2021-11-10 LAB — HEMOGLOBIN A1C
Est. average glucose Bld gHb Est-mCnc: 120 mg/dL
Hgb A1c MFr Bld: 5.8 % — ABNORMAL HIGH (ref 4.8–5.6)

## 2021-11-14 ENCOUNTER — Telehealth: Payer: Self-pay | Admitting: Internal Medicine

## 2021-11-14 ENCOUNTER — Encounter: Payer: Self-pay | Admitting: Internal Medicine

## 2021-11-14 NOTE — Telephone Encounter (Signed)
Spouse calling in regard to lab results  Pt had stoke does not answer phone   Spouse is at work can leave detailed ,message on VM or can call pt after 2:15

## 2021-11-14 NOTE — Telephone Encounter (Signed)
Pt adivsed with verbal understanding

## 2021-11-15 ENCOUNTER — Other Ambulatory Visit: Payer: Self-pay | Admitting: Internal Medicine

## 2021-11-16 ENCOUNTER — Other Ambulatory Visit: Payer: Self-pay | Admitting: Internal Medicine

## 2021-11-16 MED ORDER — DILTIAZEM HCL ER COATED BEADS 120 MG PO CP24: 120.0000 mg | ORAL_CAPSULE | Freq: Every day | ORAL | 0 refills | Status: DC

## 2021-11-27 ENCOUNTER — Ambulatory Visit (INDEPENDENT_AMBULATORY_CARE_PROVIDER_SITE_OTHER): Payer: Medicare Other | Admitting: *Deleted

## 2021-11-27 ENCOUNTER — Ambulatory Visit: Payer: Medicare Other

## 2021-11-27 DIAGNOSIS — I639 Cerebral infarction, unspecified: Secondary | ICD-10-CM | POA: Diagnosis not present

## 2021-11-27 DIAGNOSIS — I480 Paroxysmal atrial fibrillation: Secondary | ICD-10-CM | POA: Diagnosis not present

## 2021-11-27 DIAGNOSIS — Z5181 Encounter for therapeutic drug level monitoring: Secondary | ICD-10-CM

## 2021-11-27 LAB — POCT INR: INR: 3 (ref 2.0–3.0)

## 2021-11-27 NOTE — Patient Instructions (Signed)
Continue warfarin 1 tablet daily except 1/2 tablet on Tuesdays and Fridays Recheck INR in 5 weeks.  Added fish oil twice daily per Dr Posey Pronto

## 2021-12-03 ENCOUNTER — Other Ambulatory Visit: Payer: Self-pay | Admitting: Family Medicine

## 2021-12-03 DIAGNOSIS — C3492 Malignant neoplasm of unspecified part of left bronchus or lung: Secondary | ICD-10-CM

## 2021-12-03 DIAGNOSIS — Z20822 Contact with and (suspected) exposure to covid-19: Secondary | ICD-10-CM | POA: Diagnosis not present

## 2021-12-04 ENCOUNTER — Encounter: Payer: Self-pay | Admitting: Internal Medicine

## 2021-12-15 DIAGNOSIS — Z20822 Contact with and (suspected) exposure to covid-19: Secondary | ICD-10-CM | POA: Diagnosis not present

## 2021-12-22 ENCOUNTER — Encounter (HOSPITAL_COMMUNITY): Payer: Self-pay

## 2021-12-22 ENCOUNTER — Emergency Department (HOSPITAL_COMMUNITY)
Admission: EM | Admit: 2021-12-22 | Discharge: 2021-12-22 | Disposition: A | Discharge status: Home / Self Care | Payer: Medicare Other | Attending: Emergency Medicine | Admitting: Emergency Medicine

## 2021-12-22 ENCOUNTER — Other Ambulatory Visit: Payer: Self-pay

## 2021-12-22 DIAGNOSIS — X58XXXA Exposure to other specified factors, initial encounter: Secondary | ICD-10-CM | POA: Diagnosis not present

## 2021-12-22 DIAGNOSIS — R609 Edema, unspecified: Secondary | ICD-10-CM | POA: Diagnosis not present

## 2021-12-22 DIAGNOSIS — Z79899 Other long term (current) drug therapy: Secondary | ICD-10-CM | POA: Insufficient documentation

## 2021-12-22 DIAGNOSIS — R58 Hemorrhage, not elsewhere classified: Secondary | ICD-10-CM | POA: Diagnosis not present

## 2021-12-22 DIAGNOSIS — Z7901 Long term (current) use of anticoagulants: Secondary | ICD-10-CM | POA: Diagnosis not present

## 2021-12-22 DIAGNOSIS — S30852A Superficial foreign body of penis, initial encounter: Secondary | ICD-10-CM | POA: Diagnosis not present

## 2021-12-22 DIAGNOSIS — T190XXA Foreign body in urethra, initial encounter: Secondary | ICD-10-CM | POA: Diagnosis not present

## 2021-12-22 MED ORDER — LIDOCAINE HCL (PF) 1 % IJ SOLN
5.0000 mL | Freq: Once | INTRAMUSCULAR | Status: AC
  Administered 2021-12-22: 5 mL
  Filled 2021-12-22: qty 5

## 2021-12-22 NOTE — ED Provider Notes (Signed)
?[place] ?Provider Note ? ? ?CSN: [id number] ?Arrival date & time: 12/22/21  1516 ? ?  ? ?History ? ?Chief Complaint  ?Patient presents with  ? Groin Swelling  ? ? ?Randall Sullivan is a 71 y.o. male. ? ?HPI ? ?Patient with medical history including A-fib, currently on warfarin, tobacco use, emphysema, arthritis presents with complaint of a plastic bottle stuck on the end of his penis.  Patient states that the bottle has been stuck on his penis for about an hour prior to arrival, states he has tried multiple times to remove it but has been unsuccessful.  States she is having pain at the distal end of his penis,  unable to urinate, he is not immunocompromise, nondiabetic, up-to-date on on his tetanus shot. ? ?Wife is at bedside able to validate the story. ? ?Home Medications ?Prior to Admission medications   ?Medication Sig Start Date End Date Taking? Authorizing Provider  ?acetaminophen (TYLENOL) 325 MG tablet Take 2 tablets (650 mg total) by mouth every 6 (six) hours as needed for mild pain (or Fever >/= 101). 03/22/21   Angiulli, Lavon Paganini, PA-C  ?albuterol (PROVENTIL) (2.5 MG/3ML) 0.083% nebulizer solution USE 1 VIAL IN NEBULIZER EVERY 6 HOURS AS NEEDED FOR WHEEZING OR SHORTNESS OF BREATH 12/04/21   Fayrene Helper, MD  ?Cholecalciferol (VITAMIN D3) 125 MCG (5000 UT) CAPS Take 1 capsule (5,000 Units total) by mouth daily. 03/22/21   Angiulli, Lavon Paganini, PA-C  ?diltiazem (CARDIZEM CD) 120 MG 24 hr capsule Take 1 capsule (120 mg total) by mouth daily. 11/16/21   Lindell Spar, MD  ?fenofibrate (TRICOR) 145 MG tablet Take 1 tablet (145 mg total) by mouth daily. 07/02/21   Noreene Larsson, NP  ?loratadine (CLARITIN) 10 MG tablet Take 1 tablet (10 mg total) by mouth daily. 10/17/21   Lindell Spar, MD  ?meclizine (ANTIVERT) 25 MG tablet Take 1 tablet (25 mg total) by mouth 3 (three) times daily as needed for dizziness. 07/09/21   Lindell Spar, MD  ?metoprolol succinate (TOPROL-XL) 50 MG 24 hr tablet  Take 1 tablet (50 mg total) by mouth daily. Take with or immediately following a meal. 10/01/21   Lindell Spar, MD  ?OXYGEN Inhale 2-3 L into the lungs continuous. Hx of lung ca, copd, and emphysema    [provider]  ?polyethylene glycol (MIRALAX / GLYCOLAX) 17 g packet Take 17 g by mouth daily as needed for mild constipation. 03/22/21   Angiulli, Lavon Paganini, PA-C  ?pravastatin (PRAVACHOL) 80 MG tablet Take 1 tablet (80 mg total) by mouth daily. 07/09/21 01/05/22  Lindell Spar, MD  ?tamsulosin (FLOMAX) 0.4 MG CAPS capsule Take 1 capsule by mouth once daily 11/07/21   Lindell Spar, MD  ?traZODone (DESYREL) 100 MG tablet Take 1 tablet (100 mg total) by mouth at bedtime. 11/09/21   Lindell Spar, MD  ?warfarin (COUMADIN) 5 MG tablet Take 1 tablet (5 mg total) by mouth daily. 10/17/21 10/17/22  Lindell Spar, MD  ?   ? ?Allergies    ?No known allergies   ? ?Review of Systems   ?Review of Systems  ?Constitutional:  Negative for chills and fever.  ?Respiratory:  Negative for shortness of breath.   ?Cardiovascular:  Negative for chest pain.  ?Gastrointestinal:  Negative for abdominal pain.  ?Genitourinary:   ?     Bottle stuck around penis  ?Neurological:  Negative for headaches.  ? ?Physical Exam ?Updated Vital Signs ?BP [name])  153/99 (BP Location: Left Arm)   Pulse 70   Temp 97.7 ?F (36.5 ?C)   Resp 18   Ht 5\' 10"  (1.778 m)   Wt 97.5 kg   SpO2 96%   BMI 30.85 kg/m?  ?Physical Exam ?Vitals and nursing note reviewed. Exam conducted with a chaperone present.  ?Constitutional:   ?   General: He is not in acute distress. ?   Appearance: Normal appearance. He is not ill-appearing or diaphoretic.  ?HENT:  ?   Head: Normocephalic and atraumatic.  ?   Nose: No congestion or rhinorrhea.  ?Eyes:  ?   General: No scleral icterus.    ?   Right eye: No discharge.     ?   Left eye: No discharge.  ?   Conjunctiva/sclera: Conjunctivae normal.  ?Pulmonary:  ?   Effort: Pulmonary effort is normal. No respiratory distress.   ?   Breath sounds: Normal breath sounds. No wheezing.  ?Abdominal:  ?   Tenderness: There is no abdominal tenderness. There is no right CVA tenderness or left CVA tenderness.  ?Genitourinary: ?   Comments: With chaperone present genital exam was performed patient handed the neck of the bottle stuck  around the distal end of the penile shaft, about a third of the bottle was cut away  the head of the penis was exposed,  it was swollen and erythematous.  ?Musculoskeletal:  ?   Cervical back: Neck supple.  ?   Right lower leg: No edema.  ?   Left lower leg: No edema.  ?Skin: ?   General: Skin is warm and dry.  ?   Coloration: Skin is not jaundiced or pale.  ?Neurological:  ?   Mental Status: He is alert and oriented to person, place, and time.  ?Psychiatric:     ?   Mood and Affect: Mood normal.  ? ? ?ED Results / Procedures / Treatments   ?Labs ?(all labs ordered are listed, but only abnormal results are displayed) ?Labs Reviewed - No data to display ? ?EKG ?None ? ?Radiology ?No results found. ? ?Procedures ?Marland KitchenCritical Care ?Performed by: Marcello Fennel, PA-C ?Authorized by: Marcello Fennel, PA-C  ? ?Critical care provider statement:  ?  Critical care time (minutes):  30 ?  Critical care time was exclusive of:  Separately billable procedures and treating other patients ?  Critical care was necessary to treat or prevent imminent or life-threatening deterioration of the following conditions:  Circulatory failure ?  Critical care was time spent personally by me on the following activities:  Evaluation of patient's response to treatment, examination of patient, ordering and performing treatments and interventions, re-evaluation of patient's condition and review of old charts ?  I assumed direction of critical care for this patient from another provider in my specialty: no   ?  Care discussed with: admitting provider   ?Marland KitchenForeign Body Removal ? ?Date/Time: 12/22/2021 6:10 PM ?Performed by: Marcello Fennel,  PA-C ?Authorized by: Marcello Fennel, PA-C  ?Consent: Verbal consent not obtained. Written consent obtained. ?Risks and benefits: risks, benefits and alternatives were discussed ?Consent given by: patient ?Patient identity confirmed: verbally with patient ?Time out: Immediately prior to procedure a "time out" was called to verify the correct patient, procedure, equipment, support staff and site/side marked as required. ?Intake: Penile shaft. ?Anesthesia: local infiltration ? ?Anesthesia: ?Local Anesthetic: lidocaine 1% without epinephrine ? ?Sedation: ?Patient sedated: no ? ?Patient restrained: no ?Patient cooperative: yes ?Complexity: simple ?1 objects recovered. ?  Objects recovered: 1 ?Post-procedure assessment: foreign body removed ?Patient tolerance: patient tolerated the procedure well with no immediate complications ?  ? ? ?Medications Ordered in ED ?Medications  ?lidocaine (PF) (XYLOCAINE) 1 % injection 5 mL (5 mLs Other Given by Other 12/22/21 1601)  ? ? ?ED Course/ Medical Decision Making/ A&P ?  ?                        ?Medical Decision Making ?Risk ?Prescription drug management. ? ? ?This patient presents to the ED for concern of foreign body around the penis, this involves an extensive number of treatment options, and is a complaint that carries with it a high risk of complications and morbidity.  The differential diagnosis includes strangulation, ureter obstruction ? ? ? ?Additional history obtained: ? ?Additional history obtained from wife who is at bedside ?External records from outside source obtained and reviewed including N/A ? ? ?Co morbidities that complicate the patient evaluation ? ?A-fib currently on warfarin ? ?Social Determinants of Health: ? ?Geriatric ? ? ? ?Lab Tests: ? ?I Ordered, and personally interpreted labs.  The pertinent results include: N/A ? ? ?Imaging Studies ordered: ? ?I ordered imaging studies including N/A ?I independently visualized and interpreted imaging which showed  N/A ?I agree with the radiologist interpretation ? ? ?Cardiac Monitoring: ? ?The patient was maintained on a cardiac monitor.  I personally viewed and interpreted the cardiac monitored which showed an underlying r

## 2021-12-22 NOTE — ED Notes (Signed)
Patient attempting to urinate after bottle was removed from penis ?

## 2021-12-22 NOTE — ED Triage Notes (Signed)
Pt bib ems from home for penis stuck in spray bottle.  Wife attempted to remove at home but was unsuccessful.  Reports bleeding when attemping to remove.  Resp even and unlabored.  Skin warm and dry.   ?

## 2021-12-22 NOTE — Discharge Instructions (Signed)
We have removed a foreign body around your penis, it is common to have some swelling afterwards, you may apply ice to the area, may also use over-the-counter pain medication as needed.  You also have 2 small abrasions on your penis please keep the area clean you may put Neosporin over the area.  If  you started to notice a worsening redness or discharge there please come back in for reevaluation.  If you start to have difficulty urinating you need to come back in for reassessment.  Please do not place anything around your penis. ? ?Follow-up with urology as needed. ? ?Come back to the emergency department if you develop chest pain, shortness of breath, severe abdominal pain, uncontrolled nausea, vomiting, diarrhea. ? ?

## 2021-12-25 ENCOUNTER — Other Ambulatory Visit: Payer: Self-pay

## 2021-12-25 ENCOUNTER — Ambulatory Visit: Payer: Medicare Other

## 2021-12-25 LAB — HM DIABETES EYE EXAM

## 2021-12-26 DIAGNOSIS — Z20822 Contact with and (suspected) exposure to covid-19: Secondary | ICD-10-CM | POA: Diagnosis not present

## 2021-12-27 ENCOUNTER — Other Ambulatory Visit: Payer: Self-pay | Admitting: Family Medicine

## 2021-12-27 DIAGNOSIS — C3492 Malignant neoplasm of unspecified part of left bronchus or lung: Secondary | ICD-10-CM

## 2022-01-01 ENCOUNTER — Ambulatory Visit (INDEPENDENT_AMBULATORY_CARE_PROVIDER_SITE_OTHER): Payer: Medicare Other | Admitting: *Deleted

## 2022-01-01 DIAGNOSIS — I639 Cerebral infarction, unspecified: Secondary | ICD-10-CM | POA: Diagnosis not present

## 2022-01-01 DIAGNOSIS — Z5181 Encounter for therapeutic drug level monitoring: Secondary | ICD-10-CM | POA: Diagnosis not present

## 2022-01-01 DIAGNOSIS — I480 Paroxysmal atrial fibrillation: Secondary | ICD-10-CM | POA: Diagnosis not present

## 2022-01-01 LAB — POCT INR: INR: 2.3 (ref 2.0–3.0)

## 2022-01-01 NOTE — Patient Instructions (Signed)
Continue warfarin 1 tablet daily except 1/2 tablet on Tuesdays and Fridays ?Recheck INR in 6 weeks.  ?

## 2022-01-03 ENCOUNTER — Other Ambulatory Visit: Payer: Self-pay | Admitting: Internal Medicine

## 2022-01-03 ENCOUNTER — Other Ambulatory Visit: Payer: Self-pay | Admitting: *Deleted

## 2022-01-03 ENCOUNTER — Encounter: Payer: Self-pay | Admitting: Internal Medicine

## 2022-01-03 DIAGNOSIS — E785 Hyperlipidemia, unspecified: Secondary | ICD-10-CM

## 2022-01-03 MED ORDER — PRAVASTATIN SODIUM 80 MG PO TABS: 80.0000 mg | ORAL_TABLET | Freq: Every day | ORAL | 5 refills | Status: DC

## 2022-01-03 MED ORDER — FENOFIBRATE 145 MG PO TABS: 145.0000 mg | ORAL_TABLET | Freq: Every day | ORAL | 1 refills | Status: DC

## 2022-01-11 ENCOUNTER — Encounter: Payer: Self-pay | Admitting: Internal Medicine

## 2022-01-11 ENCOUNTER — Other Ambulatory Visit: Payer: Self-pay

## 2022-01-11 DIAGNOSIS — C3492 Malignant neoplasm of unspecified part of left bronchus or lung: Secondary | ICD-10-CM

## 2022-01-11 MED ORDER — ALBUTEROL SULFATE (2.5 MG/3ML) 0.083% IN NEBU: INHALATION_SOLUTION | RESPIRATORY_TRACT | 2 refills | Status: DC

## 2022-01-12 DIAGNOSIS — Z20822 Contact with and (suspected) exposure to covid-19: Secondary | ICD-10-CM | POA: Diagnosis not present

## 2022-01-14 DIAGNOSIS — Z20822 Contact with and (suspected) exposure to covid-19: Secondary | ICD-10-CM | POA: Diagnosis not present

## 2022-01-15 ENCOUNTER — Other Ambulatory Visit: Payer: Self-pay | Admitting: Internal Medicine

## 2022-01-21 ENCOUNTER — Other Ambulatory Visit: Payer: Self-pay | Admitting: *Deleted

## 2022-01-21 ENCOUNTER — Encounter: Payer: Self-pay | Admitting: Internal Medicine

## 2022-01-21 DIAGNOSIS — Z9109 Other allergy status, other than to drugs and biological substances: Secondary | ICD-10-CM

## 2022-01-21 MED ORDER — LORATADINE 10 MG PO TABS: 10.0000 mg | ORAL_TABLET | Freq: Every day | ORAL | 0 refills | Status: DC

## 2022-01-28 DIAGNOSIS — Z20822 Contact with and (suspected) exposure to covid-19: Secondary | ICD-10-CM | POA: Diagnosis not present

## 2022-01-29 DIAGNOSIS — Z20822 Contact with and (suspected) exposure to covid-19: Secondary | ICD-10-CM | POA: Diagnosis not present

## 2022-02-02 DIAGNOSIS — Z20822 Contact with and (suspected) exposure to covid-19: Secondary | ICD-10-CM | POA: Diagnosis not present

## 2022-02-03 ENCOUNTER — Other Ambulatory Visit: Payer: Self-pay | Admitting: Internal Medicine

## 2022-02-03 DIAGNOSIS — I480 Paroxysmal atrial fibrillation: Secondary | ICD-10-CM

## 2022-02-03 DIAGNOSIS — Z8673 Personal history of transient ischemic attack (TIA), and cerebral infarction without residual deficits: Secondary | ICD-10-CM

## 2022-02-04 DIAGNOSIS — R051 Acute cough: Secondary | ICD-10-CM | POA: Diagnosis not present

## 2022-02-04 DIAGNOSIS — R059 Cough, unspecified: Secondary | ICD-10-CM | POA: Diagnosis not present

## 2022-02-04 DIAGNOSIS — Z20822 Contact with and (suspected) exposure to covid-19: Secondary | ICD-10-CM | POA: Diagnosis not present

## 2022-02-12 ENCOUNTER — Ambulatory Visit (INDEPENDENT_AMBULATORY_CARE_PROVIDER_SITE_OTHER): Payer: Medicare Other | Admitting: *Deleted

## 2022-02-12 ENCOUNTER — Encounter: Payer: Self-pay | Admitting: Internal Medicine

## 2022-02-12 DIAGNOSIS — I480 Paroxysmal atrial fibrillation: Secondary | ICD-10-CM | POA: Diagnosis not present

## 2022-02-12 DIAGNOSIS — Z5181 Encounter for therapeutic drug level monitoring: Secondary | ICD-10-CM | POA: Diagnosis not present

## 2022-02-12 DIAGNOSIS — I639 Cerebral infarction, unspecified: Secondary | ICD-10-CM

## 2022-02-12 LAB — POCT INR: INR: 2.9 (ref 2.0–3.0)

## 2022-02-12 NOTE — Patient Instructions (Signed)
Continue warfarin 1 tablet daily except 1/2 tablet on Tuesdays and Fridays ?Recheck INR in 6 weeks.  ?

## 2022-02-22 ENCOUNTER — Encounter: Payer: Self-pay | Admitting: Internal Medicine

## 2022-02-24 ENCOUNTER — Other Ambulatory Visit: Payer: Self-pay | Admitting: Internal Medicine

## 2022-02-24 ENCOUNTER — Encounter: Payer: Self-pay | Admitting: Internal Medicine

## 2022-02-24 DIAGNOSIS — C3492 Malignant neoplasm of unspecified part of left bronchus or lung: Secondary | ICD-10-CM

## 2022-02-26 ENCOUNTER — Other Ambulatory Visit: Payer: Self-pay | Admitting: *Deleted

## 2022-02-26 ENCOUNTER — Telehealth: Payer: Self-pay | Admitting: Internal Medicine

## 2022-02-26 DIAGNOSIS — C3492 Malignant neoplasm of unspecified part of left bronchus or lung: Secondary | ICD-10-CM

## 2022-02-26 MED ORDER — ALBUTEROL SULFATE (2.5 MG/3ML) 0.083% IN NEBU: INHALATION_SOLUTION | RESPIRATORY_TRACT | 2 refills | Status: DC

## 2022-02-26 NOTE — Telephone Encounter (Signed)
Medication already sent to pharmacy patient message sent back on mychart

## 2022-02-26 NOTE — Telephone Encounter (Signed)
Pt left message on phone this weekend that he needs a refill for Albuterol.  He said he also left a message in my chart.

## 2022-03-05 ENCOUNTER — Ambulatory Visit: Payer: Medicare Other | Admitting: Internal Medicine

## 2022-03-26 ENCOUNTER — Encounter: Payer: Self-pay | Admitting: Cardiology

## 2022-03-26 ENCOUNTER — Ambulatory Visit (INDEPENDENT_AMBULATORY_CARE_PROVIDER_SITE_OTHER): Payer: Medicare Other | Admitting: *Deleted

## 2022-03-26 DIAGNOSIS — Z5181 Encounter for therapeutic drug level monitoring: Secondary | ICD-10-CM | POA: Diagnosis not present

## 2022-03-26 DIAGNOSIS — I639 Cerebral infarction, unspecified: Secondary | ICD-10-CM

## 2022-03-26 DIAGNOSIS — I480 Paroxysmal atrial fibrillation: Secondary | ICD-10-CM | POA: Diagnosis not present

## 2022-03-26 LAB — POCT INR: INR: 2.4 (ref 2.0–3.0)

## 2022-03-28 ENCOUNTER — Other Ambulatory Visit: Payer: Self-pay | Admitting: Internal Medicine

## 2022-03-29 ENCOUNTER — Encounter (HOSPITAL_COMMUNITY): Payer: Self-pay

## 2022-03-29 ENCOUNTER — Emergency Department (HOSPITAL_COMMUNITY)
Admission: EM | Admit: 2022-03-29 | Discharge: 2022-03-29 | Disposition: A | Discharge status: Home / Self Care | Payer: Medicare Other | Attending: Emergency Medicine | Admitting: Emergency Medicine

## 2022-03-29 ENCOUNTER — Emergency Department (HOSPITAL_COMMUNITY): Payer: Medicare Other

## 2022-03-29 ENCOUNTER — Other Ambulatory Visit: Payer: Self-pay

## 2022-03-29 ENCOUNTER — Encounter: Payer: Self-pay | Admitting: Internal Medicine

## 2022-03-29 DIAGNOSIS — J439 Emphysema, unspecified: Secondary | ICD-10-CM | POA: Insufficient documentation

## 2022-03-29 DIAGNOSIS — R0602 Shortness of breath: Secondary | ICD-10-CM | POA: Diagnosis not present

## 2022-03-29 DIAGNOSIS — Z7901 Long term (current) use of anticoagulants: Secondary | ICD-10-CM | POA: Insufficient documentation

## 2022-03-29 DIAGNOSIS — R42 Dizziness and giddiness: Secondary | ICD-10-CM | POA: Diagnosis not present

## 2022-03-29 DIAGNOSIS — J441 Chronic obstructive pulmonary disease with (acute) exacerbation: Secondary | ICD-10-CM | POA: Diagnosis not present

## 2022-03-29 DIAGNOSIS — J449 Chronic obstructive pulmonary disease, unspecified: Secondary | ICD-10-CM | POA: Diagnosis not present

## 2022-03-29 DIAGNOSIS — C3492 Malignant neoplasm of unspecified part of left bronchus or lung: Secondary | ICD-10-CM

## 2022-03-29 DIAGNOSIS — R519 Headache, unspecified: Secondary | ICD-10-CM | POA: Diagnosis not present

## 2022-03-29 LAB — COMPREHENSIVE METABOLIC PANEL
ALT: 31 U/L (ref 0–44)
AST: 24 U/L (ref 15–41)
Albumin: 4.4 g/dL (ref 3.5–5.0)
Alkaline Phosphatase: 38 U/L (ref 38–126)
Anion gap: 9 (ref 5–15)
BUN: 19 mg/dL (ref 8–23)
CO2: 26 mmol/L (ref 22–32)
Calcium: 9.3 mg/dL (ref 8.9–10.3)
Chloride: 101 mmol/L (ref 98–111)
Creatinine, Ser: 1.22 mg/dL (ref 0.61–1.24)
GFR, Estimated: >60 mL/min (ref >60)
Glucose, Bld: 117 mg/dL — ABNORMAL HIGH (ref 70–99)
Potassium: 4.2 mmol/L (ref 3.5–5.1)
Sodium: 136 mmol/L (ref 135–145)
Total Bilirubin: 1.1 mg/dL (ref 0.3–1.2)
Total Protein: 7.8 g/dL (ref 6.5–8.1)

## 2022-03-29 LAB — CBC WITH DIFFERENTIAL/PLATELET
Abs Immature Granulocytes: 0.09 10*3/uL — ABNORMAL HIGH (ref 0.00–0.07)
Basophils Absolute: 0.1 10*3/uL (ref 0.0–0.1)
Basophils Relative: 0 %
Eosinophils Absolute: 0 10*3/uL (ref 0.0–0.5)
Eosinophils Relative: 0 %
HCT: 48.7 % (ref 39.0–52.0)
Hemoglobin: 15.8 g/dL (ref 13.0–17.0)
Immature Granulocytes: 1 %
Lymphocytes Relative: 5 %
Lymphs Abs: 0.9 10*3/uL (ref 0.7–4.0)
MCH: 27.7 pg (ref 26.0–34.0)
MCHC: 32.4 g/dL (ref 30.0–36.0)
MCV: 85.4 fL (ref 80.0–100.0)
Monocytes Absolute: 1.7 10*3/uL — ABNORMAL HIGH (ref 0.1–1.0)
Monocytes Relative: 10 %
Neutro Abs: 14.6 10*3/uL — ABNORMAL HIGH (ref 1.7–7.7)
Neutrophils Relative %: 84 %
Platelets: 201 10*3/uL (ref 150–400)
RBC: 5.7 MIL/uL (ref 4.22–5.81)
RDW: 15.2 % (ref 11.5–15.5)
WBC: 17.4 10*3/uL — ABNORMAL HIGH (ref 4.0–10.5)
nRBC: 0 % (ref 0.0–0.2)

## 2022-03-29 LAB — TROPONIN I (HIGH SENSITIVITY): Troponin I (High Sensitivity): 6 ng/L (ref <18)

## 2022-03-29 LAB — BRAIN NATRIURETIC PEPTIDE: B Natriuretic Peptide: 120 pg/mL — ABNORMAL HIGH (ref 0.0–100.0)

## 2022-03-29 MED ORDER — IPRATROPIUM-ALBUTEROL 0.5-2.5 (3) MG/3ML IN SOLN
3.0000 mL | Freq: Once | RESPIRATORY_TRACT | Status: AC
  Administered 2022-03-29: 3 mL via RESPIRATORY_TRACT
  Filled 2022-03-29: qty 3

## 2022-03-29 MED ORDER — AZITHROMYCIN 250 MG PO TABS
500.0000 mg | ORAL_TABLET | Freq: Once | ORAL | Status: AC
  Administered 2022-03-29: 500 mg via ORAL
  Filled 2022-03-29: qty 2

## 2022-03-29 MED ORDER — METHYLPREDNISOLONE SODIUM SUCC 125 MG IJ SOLR
125.0000 mg | Freq: Once | INTRAMUSCULAR | Status: AC
  Administered 2022-03-29: 125 mg via INTRAVENOUS
  Filled 2022-03-29: qty 2

## 2022-03-29 MED ORDER — AZITHROMYCIN 250 MG PO TABS: 250.0000 mg | ORAL_TABLET | Freq: Every day | ORAL | 0 refills | Status: DC

## 2022-03-29 MED ORDER — ALBUTEROL SULFATE (2.5 MG/3ML) 0.083% IN NEBU: INHALATION_SOLUTION | RESPIRATORY_TRACT | 2 refills | Status: DC

## 2022-03-29 MED ORDER — PREDNISONE 10 MG PO TABS: 20.0000 mg | ORAL_TABLET | Freq: Two times a day (BID) | ORAL | 0 refills | Status: DC

## 2022-03-29 NOTE — ED Triage Notes (Signed)
Pt reports increased sob that started a couple of days ago, but got worse last night. Pt with hx of emphysema and lung CA in remission. Pt also reports dizziness and headache for about 3 days also. Pt is on 2L oxygen PRN at home.

## 2022-03-29 NOTE — Discharge Instructions (Signed)
Begin taking prednisone and Zithromax as prescribed.  Albuterol nebulizer every 4 hours as needed.  Return to the ER if symptoms significantly worsen or change.

## 2022-03-29 NOTE — ED Provider Notes (Signed)
[facility] Provider Note   CSN: [id number] Arrival date & time: 03/29/22  4166     History  Chief Complaint  Patient presents with   Shortness of Breath    Dizzy, headache    Randall Sullivan is a 71 y.o. male.  Patient is a 71 year old male with past medical history of emphysema, prior lobectomy, paroxysmal A-fib, prior CVA.  Patient presenting today with complaints of shortness of breath.  Symptoms began 2 days ago, however worsened last night.  He has been using his nebulizer at home with little relief.  Patient has oxygen at home that he wears as needed.  He denies any fevers, chills, or productive cough.  He denies to me he is having chest pain but does have some pressure in his head behind his eyes.  The history is provided by the patient.       Home Medications Prior to Admission medications   Medication Sig Start Date End Date Taking? Authorizing Provider  acetaminophen (TYLENOL) 325 MG tablet Take 2 tablets (650 mg total) by mouth every 6 (six) hours as needed for mild pain (or Fever >/= 101). 03/22/21   Angiulli, Lavon Paganini, PA-C  albuterol (PROVENTIL) (2.5 MG/3ML) 0.083% nebulizer solution USE 1 VIAL IN NEBULIZER EVERY 6 HOURS AS NEEDED FOR WHEEZING AND FOR SHORTNESS OF BREATH 02/26/22   Lindell Spar, MD  albuterol (PROVENTIL) (2.5 MG/3ML) 0.083% nebulizer solution USE 1 VIAL IN NEBULIZER EVERY 6 HOURS AS NEEDED FOR WHEEZING OR SHORTNESS OF BREATH 02/26/22   [place]ell Spar, MD  Cholecalciferol (VITAMIN D3) 125 MCG (5000 UT) CAPS Take 1 capsule (5,000 Units total) by mouth daily. 03/22/21   Angiulli, Lavon Paganini, PA-C  diltiazem (CARDIZEM CD) 120 MG 24 hr capsule Take 1 capsule by mouth once daily 01/15/22   Lindell Spar, MD  fenofibrate (TRICOR) 145 MG tablet Take 1 tablet (145 mg total) by mouth daily. 01/03/22   Lindell Spar, MD  loratadine (CLARITIN) 10 MG tablet Take 1 tablet (10 mg total) by mouth daily. 01/21/22   Lindell Spar, MD  meclizine  (ANTIVERT) 25 MG tablet Take 1 tablet (25 mg total) by mouth 3 (three) times daily as needed for dizziness. 07/09/21   [place]ell Spar, MD  metoprolol succinate (TOPROL-XL) 50 MG 24 hr tablet TAKE 1 TABLET BY MOUTH ONCE DAILY WITH OR IMMEDIATELY FOLLOWING A MEAL 03/28/22   Lindell Spar, MD  OXYGEN Inhale 2-3 L into the lungs continuous. Hx of lung ca, copd, and emphysema    [provider]  polyethylene glycol (MIRALAX / GLYCOLAX) 17 g packet Take 17 g by mouth daily as needed for mild constipation. 03/22/21   Angiulli, Lavon Paganini, PA-C  pravastatin (PRAVACHOL) 80 MG tablet Take 1 tablet by mouth once daily 01/03/22   [place]ell Spar, MD  pravastatin (PRAVACHOL) 80 MG tablet Take 1 tablet (80 mg total) by mouth daily. 01/03/22 07/02/22  Lindell Spar, MD  tamsulosin (FLOMAX) 0.4 MG CAPS capsule Take 1 capsule by mouth once daily 11/07/21   Lindell Spar, MD  traZODone (DESYREL) 100 MG tablet Take 1 tablet (100 mg total) by mouth at bedtime. 11/09/21   Lindell Spar, MD  warfarin (COUMADIN) 5 MG tablet Take 1 tablet by mouth once daily 02/04/22   [place]ell Spar, MD      Allergies    No known allergies    Review of Systems   Review of Systems  All other  systems reviewed and are negative.   Physical Exam Updated Vital Signs BP 128/80   Pulse 90   Temp 99.1 F (37.3 C) (Oral)   Resp (!) 23   Ht 5\' 10"  (1.778 m)   Wt 99.8 kg   SpO2 94%   BMI 31.57 kg/m  Physical Exam Vitals and nursing note reviewed.  Constitutional:      General: He is not in acute distress.    Appearance: He is well-developed. He is not diaphoretic.  HENT:     Head: Normocephalic and atraumatic.  Cardiovascular:     Rate and Rhythm: Normal rate and regular rhythm.     Heart sounds: No murmur heard.    No friction rub.  Pulmonary:     Effort: Pulmonary effort is normal. No respiratory distress.     Breath sounds: Examination of the right-middle field reveals rhonchi. Examination of the left-middle field  reveals rhonchi. Rhonchi present. No wheezing or rales.  Abdominal:     General: Bowel sounds are normal. There is no distension.     Palpations: Abdomen is soft.     Tenderness: There is no abdominal tenderness.  Musculoskeletal:        General: Normal range of motion.     Cervical back: Normal range of motion and neck supple.  Skin:    General: Skin is warm and dry.  Neurological:     Mental Status: He is alert and oriented to person, place, and time.     Coordination: Coordination normal.     ED Results / Procedures / Treatments   Labs (all labs ordered are listed, but only abnormal results are displayed) Labs Reviewed  COMPREHENSIVE METABOLIC PANEL  CBC WITH DIFFERENTIAL/PLATELET  BRAIN NATRIURETIC PEPTIDE  TROPONIN I (HIGH SENSITIVITY)    EKG None  Radiology No results found.  Procedures Procedures  {Document cardiac monitor, telemetry assessment procedure when appropriate:1}  Medications Ordered in ED Medications  methylPREDNISolone sodium succinate (SOLU-MEDROL) 125 mg/2 mL injection 125 mg (has no administration in time range)  ipratropium-albuterol (DUONEB) 0.5-2.5 (3) MG/3ML nebulizer solution 3 mL (has no administration in time range)    ED Course/ Medical Decision Making/ A&P                           Medical Decision Making Amount and/or Complexity of Data Reviewed Labs: ordered. Radiology: ordered.  Risk Prescription drug management.   ***  {Document critical care time when appropriate:1} {Document review of labs and clinical decision tools ie heart score, Chads2Vasc2 etc:1}  {Document your independent review of radiology images, and any outside records:1} {Document your discussion with family members, caretakers, and with consultants:1} {Document social determinants of health affecting pt's care:1} {Document your decision making why or why not admission, treatments were needed:1} Final Clinical Impression(s) / ED Diagnoses Final diagnoses:   None    Rx / DC Orders ED Discharge Orders     None

## 2022-04-09 ENCOUNTER — Ambulatory Visit (INDEPENDENT_AMBULATORY_CARE_PROVIDER_SITE_OTHER): Payer: Medicare Other | Admitting: Internal Medicine

## 2022-04-09 ENCOUNTER — Encounter: Payer: Self-pay | Admitting: Internal Medicine

## 2022-04-09 VITALS — BP 136/82 | HR 69 | Resp 18 | Ht 70.0 in | Wt 223.6 lb

## 2022-04-09 DIAGNOSIS — I10 Essential (primary) hypertension: Secondary | ICD-10-CM | POA: Diagnosis not present

## 2022-04-09 DIAGNOSIS — Z1159 Encounter for screening for other viral diseases: Secondary | ICD-10-CM | POA: Diagnosis not present

## 2022-04-09 DIAGNOSIS — E1169 Type 2 diabetes mellitus with other specified complication: Secondary | ICD-10-CM

## 2022-04-09 DIAGNOSIS — S80861A Insect bite (nonvenomous), right lower leg, initial encounter: Secondary | ICD-10-CM

## 2022-04-09 DIAGNOSIS — W57XXXA Bitten or stung by nonvenomous insect and other nonvenomous arthropods, initial encounter: Secondary | ICD-10-CM

## 2022-04-09 DIAGNOSIS — Z23 Encounter for immunization: Secondary | ICD-10-CM | POA: Diagnosis not present

## 2022-04-09 DIAGNOSIS — I480 Paroxysmal atrial fibrillation: Secondary | ICD-10-CM | POA: Diagnosis not present

## 2022-04-09 DIAGNOSIS — R42 Dizziness and giddiness: Secondary | ICD-10-CM

## 2022-04-09 DIAGNOSIS — E785 Hyperlipidemia, unspecified: Secondary | ICD-10-CM | POA: Diagnosis not present

## 2022-04-09 DIAGNOSIS — Z1211 Encounter for screening for malignant neoplasm of colon: Secondary | ICD-10-CM

## 2022-04-09 DIAGNOSIS — J439 Emphysema, unspecified: Secondary | ICD-10-CM | POA: Diagnosis not present

## 2022-04-09 MED ORDER — IPRATROPIUM-ALBUTEROL 0.5-2.5 (3) MG/3ML IN SOLN
3.0000 mL | Freq: Four times a day (QID) | RESPIRATORY_TRACT | 3 refills | Status: DC | PRN
Start: 2022-04-09 — End: 2023-05-06

## 2022-04-09 NOTE — Assessment & Plan Note (Signed)
BP Readings from Last 1 Encounters:  04/09/22 136/82   Remains WNL at home Well controlled, on Metoprolol and Diltiazem Plan to add ARB if persistently elevated, had cough with ACEi Counseled for compliance with the medications Advised DASH diet and moderate exercise/walking, at least 150 mins/week

## 2022-04-09 NOTE — Progress Notes (Addendum)
Established Patient Office Visit  Subjective:  Patient ID: Randall Sullivan, male    DOB: [birth year]  Age: 71 y.o. MRN: [id number]  CC:  Chief Complaint  Patient presents with   Follow-up    Follow up pt has been feeling dizzy on and off for awhile but has been dizzy for the last few days pt still has right side pain pt thinks he got bit by something on back of right leg     HPI Randall Sullivan is a 71 y.o. male with past medical history of HTN, PAF, CVA, lung cancer s/p lobectomy, HLD and insomnia who presents for f/u of his chronic medical conditions.  He takes Coumadin for PAF and takes statin for HLD.  Denies any chest pain, dyspnea or palpitations currently.  Denies any recent change in sensation over UE or LE.  He complains of chronic intermittent dizziness, which improves with  meclizine.  He reports an insect bite on his right leg about 2 weeks ago, where he had bump, which has been decreasing in size.  He denies any fever or chills.  Denies any local discharge or bleeding currently.  He went to ER recently for COPD exacerbation.  He was given azithromycin and prednisone for it, which has improved his dyspnea.  He uses albuterol nebulizer for dyspnea or wheezing and requests it about 2-3 times in a day.  Denies any recent fever, chills or hemoptysis.   Past Medical History:  Diagnosis Date   A-fib [facility]) 2017   after last lung surgery patient had a history if Afib documented in hospital    Acute appendicitis 07/14/2021   Allergy    Arthritis    back & legs ( knees)   Cancer (HCC)    lung   Complication of anesthesia    agitated upon waking from anesth.    Emphysema    History of lung cancer    Hyperlipidemia    Hypertension    Oxygen dependent    2L continuous   Stroke ([place])    Tobacco abuse     Past Surgical History:  Procedure Laterality Date   HERNIA REPAIR Bilateral 6237   umbilical   LAPAROSCOPIC APPENDECTOMY N/A 07/15/2021   Procedure: APPENDECTOMY LAPAROSCOPIC;   Surgeon: Virl Cagey, MD;  Location: [facility];  Service: General;  Laterality: N/A;   VIDEO ASSISTED THORACOSCOPY Right 12/21/2018   Procedure: VIDEO ASSISTED THORACOSCOPY AND RESECTION OF BLEBS RIGHT LOWER LOBE;  Surgeon: [name], MD;  Location: [facility];  Service: Thoracic;  Laterality: Right;   VIDEO ASSISTED THORACOSCOPY (VATS)/ LOBECTOMY Left 08/01/2016   Procedure: VIDEO ASSISTED THORACOSCOPY (VATS)/ LEFT UPPER LOBECTOMY with lymph node sampling and onQ placement;  Surgeon: [name], MD;  Location: [facility];  Service: Thoracic;  Laterality: Left;   VIDEO ASSISTED THORACOSCOPY (VATS)/WEDGE RESECTION Right 12/09/2018   Procedure: VIDEO ASSISTED THORACOSCOPY (VATS)/WEDGE RESECTION;  Surgeon: [name], MD;  Location: [facility];  Service: Thoracic;  Laterality: Right;   VIDEO BRONCHOSCOPY Bilateral 12/21/2018   Procedure: VIDEO BRONCHOSCOPY;  Surgeon: [name], MD;  Location: [facility];  Service: Thoracic;  Laterality: Bilateral;   VIDEO BRONCHOSCOPY WITH ENDOBRONCHIAL NAVIGATION N/A 07/03/2016   Procedure: VIDEO BRONCHOSCOPY WITH ENDOBRONCHIAL NAVIGATION;  Surgeon: [name], MD;  Location: [facility];  Service: Thoracic;  Laterality: N/A;   VIDEO BRONCHOSCOPY WITH INSERTION OF INTERBRONCHIAL VALVE (IBV) N/A 12/11/2018   Procedure: VIDEO BRONCHOSCOPY WITH INSERTION OF INTERBRONCHIAL VALVE (IBV);  Surgeon: [name],  MD;  Location: [facility];  Service: Thoracic;  Laterality: N/A;   VIDEO BRONCHOSCOPY WITH INSERTION OF INTERBRONCHIAL VALVE (IBV) N/A 02/10/2019   Procedure: VIDEO BRONCHOSCOPY WITH REMOVAL OF INTERBRONCHIAL VALVE (IBV);  Surgeon: [name], MD;  Location: [facility];  Service: Thoracic;  Laterality: N/A;    Family History  Problem Relation Age of Onset   Cancer Mother    Cancer Sister    Aneurysm Sister    Kidney disease Paternal Aunt    Asthma Brother    Cancer - Lung Brother    Cancer Brother     Social  History   Socioeconomic History   Marital status: Married    Spouse name: Not on file   Number of children: 2   Years of education: Not on file   Highest education level: Not on file  Occupational History   Occupation: Vinyl siding   Occupation: Retired  Tobacco Use   Smoking status: Former    Packs/day: 1.00    Years: 40.00    Total pack years: 40.00    Types: Cigarettes    Quit date: 05/28/2016    Years since quitting: 5.8   Smokeless tobacco: Never  Vaping Use   Vaping Use: Never used  Substance and Sexual Activity   Alcohol use: Not Currently    Comment: quit- 2016   Drug use: No   Sexual activity: Not Currently  Other Topics Concern   Not on file  Social History Narrative   Married for 57 years,second.Lives with wife. 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   Social Determinants of Health   Financial Resource Strain: Not on file  Food Insecurity: Not on file  Transportation Needs: No Transportation Needs (07/31/2021)   PRAPARE - Hydrologist (Medical): No    Lack of Transportation (Non-Medical): No  Physical Activity: Insufficiently Active (07/31/2021)   Exercise Vital Sign    Days of Exercise per Week: 7 days    Minutes of Exercise per Session: 20 min  Stress: Not on file  Social Connections: Moderately Isolated (07/31/2021)   Social Connection and Isolation Panel [NHANES]    Frequency of Communication with Friends and Family: More than three times a week    Frequency of Social Gatherings with Friends and Family: Once a week    Attends Religious Services: Never    Marine scientist or Organizations: No    Attends Archivist Meetings: Never    Marital Status: Married  Human resources officer Violence: Not At Risk (07/31/2021)   Humiliation, Afraid, Rape, and Kick questionnaire    Fear of Current or Ex-Partner: No    Emotionally Abused: No    Physically Abused: No    Sexually Abused: No    Outpatient Medications Prior to  Visit  Medication Sig Dispense Refill   acetaminophen (TYLENOL) 325 MG tablet Take 2 tablets (650 mg total) by mouth every 6 (six) hours as needed for mild pain (or Fever >/= 101).     albuterol (PROVENTIL) (2.5 MG/3ML) 0.083% nebulizer solution USE 1 VIAL IN NEBULIZER EVERY 6 HOURS AS NEEDED FOR WHEEZING AND FOR SHORTNESS OF BREATH 150 mL 0   albuterol (PROVENTIL) (2.5 MG/3ML) 0.083% nebulizer solution USE 1 VIAL IN NEBULIZER EVERY 6 HOURS AS NEEDED FOR WHEEZING OR SHORTNESS OF BREATH 180 mL 2   Cholecalciferol (VITAMIN D3) 125 MCG (5000 UT) CAPS Take 1 capsule (5,000 Units total) by mouth daily. 30 capsule 0   diltiazem (CARDIZEM CD) 120 MG 24  hr capsule Take 1 capsule by mouth once daily 30 capsule 5   fenofibrate (TRICOR) 145 MG tablet Take 1 tablet (145 mg total) by mouth daily. 90 tablet 1   loratadine (CLARITIN) 10 MG tablet Take 1 tablet (10 mg total) by mouth daily. 90 tablet 0   meclizine (ANTIVERT) 25 MG tablet Take 1 tablet (25 mg total) by mouth 3 (three) times daily as needed for dizziness. 30 tablet 0   metoprolol succinate (TOPROL-XL) 50 MG 24 hr tablet TAKE 1 TABLET BY MOUTH ONCE DAILY WITH OR IMMEDIATELY FOLLOWING A MEAL 90 tablet 0   OXYGEN Inhale 2-3 L into the lungs continuous. Hx of lung ca, copd, and emphysema     polyethylene glycol (MIRALAX / GLYCOLAX) 17 g packet Take 17 g by mouth daily as needed for mild constipation. 14 each 0   pravastatin (PRAVACHOL) 80 MG tablet Take 1 tablet (80 mg total) by mouth daily. 30 tablet 5   tamsulosin (FLOMAX) 0.4 MG CAPS capsule Take 1 capsule by mouth once daily 90 capsule 3   traZODone (DESYREL) 100 MG tablet Take 1 tablet (100 mg total) by mouth at bedtime. 90 tablet 1   warfarin (COUMADIN) 5 MG tablet Take 1 tablet by mouth once daily 90 tablet 0   pravastatin (PRAVACHOL) 80 MG tablet Take 1 tablet by mouth once daily 30 tablet 0   azithromycin (ZITHROMAX) 250 MG tablet Take 1 tablet (250 mg total) by mouth daily. (Patient not  taking: Reported on 04/09/2022) 4 tablet 0   predniSONE (DELTASONE) 10 MG tablet Take 2 tablets (20 mg total) by mouth 2 (two) times daily. (Patient not taking: Reported on 04/09/2022) 20 tablet 0   No facility-administered medications prior to visit.    Allergies  Allergen Reactions   No Known Allergies     ROS Review of Systems  Constitutional:  Positive for fatigue. Negative for chills and fever.  HENT:  Negative for congestion, sinus pressure and sinus pain.   Eyes:  Positive for visual disturbance. Negative for redness.  Respiratory:  Positive for shortness of breath. Negative for cough.   Cardiovascular:  Negative for chest pain and palpitations.  Gastrointestinal:  Negative for diarrhea, nausea and vomiting.  Genitourinary:  Negative for dysuria and hematuria.  Musculoskeletal:  Negative for neck pain and neck stiffness.  Skin:  Negative for rash.  Neurological:  Positive for dizziness. Negative for tremors, seizures, syncope and headaches.  Psychiatric/Behavioral:  Positive for sleep disturbance. Negative for agitation and behavioral problems.       Objective:    Physical Exam Vitals reviewed.  Constitutional:      General: He is not in acute distress.    Appearance: He is not diaphoretic.  HENT:     Head: Normocephalic and atraumatic.     Nose: Nose normal.     Mouth/Throat:     Mouth: Mucous membranes are moist.  Eyes:     General: No scleral icterus.    Extraocular Movements: Extraocular movements intact.  Cardiovascular:     Rate and Rhythm: Normal rate and regular rhythm.     Pulses: Normal pulses.     Heart sounds: Normal heart sounds. No murmur heard. Pulmonary:     Breath sounds: Normal breath sounds. No wheezing or rales.  Abdominal:     Palpations: Abdomen is soft.     Tenderness: There is no abdominal tenderness.  Musculoskeletal:     Cervical back: Neck supple. No tenderness.     Right  lower leg: No edema.     Left lower leg: No edema.  Skin:     General: Skin is warm.     Findings: Erythema (With mild swelling, around 2 cm in diameter, no warmth or discharge) present. No rash.  Neurological:     General: No focal deficit present.     Mental Status: He is alert and oriented to person, place, and time. Mental status is at baseline.     Sensory: No sensory deficit.  Psychiatric:        Mood and Affect: Mood normal.        Behavior: Behavior normal.     BP 136/82 (BP Location: Left Arm, Patient Position: Sitting, Cuff Size: Normal)   Pulse 69   Resp 18   Ht _0  (1.778 m)   Wt 223 lb 9.6 oz (101.4 kg)   SpO2 95%   BMI 32.08 kg/m  Wt Readings from Last 3 Encounters:  04/09/22 223 lb 9.6 oz (101.4 kg)  03/29/22 220 lb (99.8 kg)  12/22/21 215 lb (97.5 kg)    Lab Results  Component Value Date   TSH 1.590 07/05/2021   Lab Results  Component Value Date   WBC 17.4 (H) 03/29/2022   HGB 15.8 03/29/2022   HCT 48.7 03/29/2022   MCV 85.4 03/29/2022   PLT 201 03/29/2022   Lab Results  Component Value Date   NA 136 03/29/2022   K 4.2 03/29/2022   CHLORIDE 99 04/09/2017   CO2 26 03/29/2022   GLUCOSE 117 (H) 03/29/2022   BUN 19 03/29/2022   CREATININE 1.22 03/29/2022   BILITOT 1.1 03/29/2022   ALKPHOS 38 03/29/2022   AST 24 03/29/2022   ALT 31 03/29/2022   PROT 7.8 03/29/2022   ALBUMIN 4.4 03/29/2022   CALCIUM 9.3 03/29/2022   ANIONGAP 9 03/29/2022   EGFR 55 (L) 07/31/2021   Lab Results  Component Value Date   CHOL 193 11/09/2021   Lab Results  Component Value Date   HDL 32 (L) 11/09/2021   Lab Results  Component Value Date   LDLCALC 103 (H) 11/09/2021   Lab Results  Component Value Date   TRIG 342 (H) 11/09/2021   Lab Results  Component Value Date   CHOLHDL 6.0 (H) 11/09/2021   Lab Results  Component Value Date   HGBA1C 5.8 (H) 11/09/2021      Assessment & Plan:   Problem List Items Addressed This Visit       Cardiovascular and Mediastinum   Essential (primary) hypertension    BP  Readings from Last 1 Encounters:  04/09/22 136/82  Remains WNL at home Well controlled, on Metoprolol and Diltiazem Plan to add ARB if persistently elevated, had cough with ACEi Counseled for compliance with the medications Advised DASH diet and moderate exercise/walking, at least 150 mins/week      PAF (paroxysmal atrial fibrillation) (HCC)    On metoprolol and diltiazem for rate control On Coumadin for [facility] Followed by cardiology and [facility] clinic        Respiratory   Emphysema of lung ([place])    Had recent COPD exacerbation Currently has only albuterol neb, uses it frequently Added DuoNeb for maintenance neb for now Does not like to use inhaler      Relevant Medications   ipratropium-albuterol (DUONEB) 0.5-2.5 (3) MG/3ML SOLN     Endocrine   Diabetes mellitus ([place]) - Primary    Lab Results  Component Value Date   HGBA1C 5.8 (H) 11/09/2021  Was on Glimepiride 1 mg QD, now diet controlled Would prefer to start Metformin or SGLT2 inhibitor if HbA1C increases now On statin Needs to see Ophthalmologist for diabetic eye exam      Relevant Orders   Microalbumin / creatinine urine ratio   Basic Metabolic Panel (BMET)   Hemoglobin A1c     Other   Hyperlipidemia    Lipid profile reviewed Had increased Pravastatin dose to 80 mg QD On fenofibrate Would prefer to add Vascepa, but cost is a limiting factor currently      Relevant Orders   Lipid Profile   Other Visit Diagnoses     Special screening for malignant neoplasms, colon       Relevant Orders   Cologuard   Need for hepatitis C screening test       Relevant Orders   Hepatitis C Antibody   Insect bite of right lower leg, initial encounter     About 2 weeks ago Does not look like infected currently, resolving Keep area clean and dry    Need for pneumococcal 20-valent conjugate vaccination       Relevant Orders   Pneumococcal conjugate vaccine 20-valent (Prevnar 20) (Completed)       Meds ordered this  encounter  Medications   ipratropium-albuterol (DUONEB) 0.5-2.5 (3) MG/3ML SOLN    Sig: Take 3 mLs by nebulization every 6 (six) hours as needed.    Dispense:  360 mL    Refill:  3    Follow-up: Return in about 6 months (around 10/10/2022) for COPD and HTN.    [place]ell Spar, MD

## 2022-04-09 NOTE — Assessment & Plan Note (Signed)
Had recent COPD exacerbation Currently has only albuterol neb, uses it frequently Added DuoNeb for maintenance neb for now Does not like to use inhaler

## 2022-04-09 NOTE — Patient Instructions (Signed)
Please continue taking medications as prescribed.  Please continue to follow low salt diet and ambulate as tolerated. 

## 2022-04-09 NOTE — Assessment & Plan Note (Addendum)
Lipid profile reviewed Had increased Pravastatin dose to 80 mg QD On fenofibrate Would prefer to add Vascepa, but cost is a limiting factor currently

## 2022-04-09 NOTE — Assessment & Plan Note (Signed)
Dizziness likely due to vertigo Meclizine PRN Small, frequent meals Avoid sudden positional changes

## 2022-04-09 NOTE — Assessment & Plan Note (Addendum)
On metoprolol and diltiazem for rate control On Coumadin for [facility] Followed by cardiology and [facility] clinic

## 2022-04-09 NOTE — Assessment & Plan Note (Signed)
Lab Results  Component Value Date   HGBA1C 5.8 (H) 11/09/2021   Was on Glimepiride 1 mg QD, now diet controlled Would prefer to start Metformin or SGLT2 inhibitor if HbA1C increases now On statin Needs to see Ophthalmologist for diabetic eye exam

## 2022-04-10 ENCOUNTER — Other Ambulatory Visit: Payer: Self-pay | Admitting: Internal Medicine

## 2022-04-10 ENCOUNTER — Telehealth: Payer: Self-pay | Admitting: Internal Medicine

## 2022-04-10 DIAGNOSIS — E782 Mixed hyperlipidemia: Secondary | ICD-10-CM

## 2022-04-10 LAB — LIPID PANEL
Chol/HDL Ratio: 7.9 ratio — ABNORMAL HIGH (ref 0.0–5.0)
Cholesterol, Total: 253 mg/dL — ABNORMAL HIGH (ref 100–199)
HDL: 32 mg/dL — ABNORMAL LOW (ref >39)
LDL Chol Calc (NIH): 97 mg/dL (ref 0–99)
Triglycerides: 730 mg/dL (ref 0–149)
VLDL Cholesterol Cal: 124 mg/dL — ABNORMAL HIGH (ref 5–40)

## 2022-04-10 LAB — BASIC METABOLIC PANEL
BUN/Creatinine Ratio: 14 (ref 10–24)
BUN: 17 mg/dL (ref 8–27)
CO2: 26 mmol/L (ref 20–29)
Calcium: 9.4 mg/dL (ref 8.6–10.2)
Chloride: 97 mmol/L (ref 96–106)
Creatinine, Ser: 1.25 mg/dL (ref 0.76–1.27)
Glucose: 84 mg/dL (ref 70–99)
Potassium: 5 mmol/L (ref 3.5–5.2)
Sodium: 137 mmol/L (ref 134–144)
eGFR: 61 mL/min/{1.73_m2} (ref >59)

## 2022-04-10 LAB — HEMOGLOBIN A1C
Est. average glucose Bld gHb Est-mCnc: 146 mg/dL
Hgb A1c MFr Bld: 6.7 % — ABNORMAL HIGH (ref 4.8–5.6)

## 2022-04-10 LAB — HEPATITIS C ANTIBODY: Hep C Virus Ab: NONREACTIVE

## 2022-04-10 NOTE — Telephone Encounter (Signed)
Pt returning call for labs 

## 2022-04-10 NOTE — Telephone Encounter (Signed)
Pt advised with verbal understanding  °

## 2022-04-11 LAB — MICROALBUMIN / CREATININE URINE RATIO
Creatinine, Urine: 104.4 mg/dL
Microalb/Creat Ratio: 8 mg/g creat (ref 0–29)
Microalbumin, Urine: 8 ug/mL

## 2022-04-17 ENCOUNTER — Encounter: Payer: Self-pay | Admitting: Internal Medicine

## 2022-04-17 ENCOUNTER — Other Ambulatory Visit: Payer: Self-pay

## 2022-04-17 DIAGNOSIS — Z9109 Other allergy status, other than to drugs and biological substances: Secondary | ICD-10-CM

## 2022-04-17 MED ORDER — LORATADINE 10 MG PO TABS: 10.0000 mg | ORAL_TABLET | Freq: Every day | ORAL | 1 refills | Status: DC

## 2022-04-25 ENCOUNTER — Encounter: Payer: Self-pay | Admitting: Internal Medicine

## 2022-04-28 ENCOUNTER — Encounter: Payer: Self-pay | Admitting: Internal Medicine

## 2022-04-29 ENCOUNTER — Other Ambulatory Visit: Payer: Self-pay | Admitting: *Deleted

## 2022-04-29 DIAGNOSIS — C3492 Malignant neoplasm of unspecified part of left bronchus or lung: Secondary | ICD-10-CM

## 2022-04-29 MED ORDER — ALBUTEROL SULFATE (2.5 MG/3ML) 0.083% IN NEBU: INHALATION_SOLUTION | RESPIRATORY_TRACT | 0 refills | Status: DC

## 2022-05-07 ENCOUNTER — Ambulatory Visit (INDEPENDENT_AMBULATORY_CARE_PROVIDER_SITE_OTHER): Payer: Medicare Other | Admitting: *Deleted

## 2022-05-07 DIAGNOSIS — I480 Paroxysmal atrial fibrillation: Secondary | ICD-10-CM

## 2022-05-07 DIAGNOSIS — I639 Cerebral infarction, unspecified: Secondary | ICD-10-CM | POA: Diagnosis not present

## 2022-05-07 DIAGNOSIS — Z5181 Encounter for therapeutic drug level monitoring: Secondary | ICD-10-CM

## 2022-05-07 LAB — POCT INR: INR: 2.6 (ref 2.0–3.0)

## 2022-05-07 NOTE — Patient Instructions (Signed)
Continue warfarin 1 tablet daily except 1/2 tablet on Tuesdays and Fridays Recheck INR in 6 weeks.

## 2022-05-13 ENCOUNTER — Other Ambulatory Visit: Payer: Self-pay | Admitting: *Deleted

## 2022-05-13 ENCOUNTER — Encounter: Payer: Self-pay | Admitting: Internal Medicine

## 2022-05-13 DIAGNOSIS — C3492 Malignant neoplasm of unspecified part of left bronchus or lung: Secondary | ICD-10-CM

## 2022-05-13 MED ORDER — ALBUTEROL SULFATE (2.5 MG/3ML) 0.083% IN NEBU: INHALATION_SOLUTION | RESPIRATORY_TRACT | 2 refills | Status: DC

## 2022-05-16 ENCOUNTER — Other Ambulatory Visit: Payer: Self-pay | Admitting: Internal Medicine

## 2022-05-16 ENCOUNTER — Encounter: Payer: Self-pay | Admitting: Internal Medicine

## 2022-05-16 DIAGNOSIS — I480 Paroxysmal atrial fibrillation: Secondary | ICD-10-CM

## 2022-05-16 DIAGNOSIS — Z8673 Personal history of transient ischemic attack (TIA), and cerebral infarction without residual deficits: Secondary | ICD-10-CM

## 2022-05-17 ENCOUNTER — Other Ambulatory Visit: Payer: Self-pay

## 2022-05-17 DIAGNOSIS — I480 Paroxysmal atrial fibrillation: Secondary | ICD-10-CM

## 2022-05-17 DIAGNOSIS — Z8673 Personal history of transient ischemic attack (TIA), and cerebral infarction without residual deficits: Secondary | ICD-10-CM

## 2022-05-17 MED ORDER — WARFARIN SODIUM 5 MG PO TABS: 5.0000 mg | ORAL_TABLET | Freq: Every day | ORAL | 1 refills | Status: DC

## 2022-05-19 ENCOUNTER — Encounter: Payer: Self-pay | Admitting: Internal Medicine

## 2022-05-23 ENCOUNTER — Telehealth: Payer: Self-pay | Admitting: Internal Medicine

## 2022-05-23 NOTE — Telephone Encounter (Signed)
Rescheduled 09/05 and 09/07 appointments due to provider pa; patient has been called and voicemail was left.

## 2022-05-28 ENCOUNTER — Other Ambulatory Visit: Payer: Self-pay

## 2022-06-04 ENCOUNTER — Inpatient Hospital Stay: Payer: Medicare Other

## 2022-06-06 ENCOUNTER — Inpatient Hospital Stay: Payer: Medicare Other | Admitting: Internal Medicine

## 2022-06-10 ENCOUNTER — Other Ambulatory Visit: Payer: Self-pay | Admitting: Internal Medicine

## 2022-06-10 DIAGNOSIS — C3490 Malignant neoplasm of unspecified part of unspecified bronchus or lung: Secondary | ICD-10-CM

## 2022-06-11 ENCOUNTER — Ambulatory Visit (HOSPITAL_COMMUNITY)
Admission: RE | Admit: 2022-06-11 | Discharge: 2022-06-11 | Disposition: A | Discharge status: Home / Self Care | Payer: Medicare Other | Source: Ambulatory Visit | Attending: Internal Medicine | Admitting: Internal Medicine

## 2022-06-11 ENCOUNTER — Inpatient Hospital Stay: Payer: Medicare Other | Attending: Internal Medicine

## 2022-06-11 ENCOUNTER — Other Ambulatory Visit: Payer: Medicare Other

## 2022-06-11 ENCOUNTER — Other Ambulatory Visit: Payer: Self-pay

## 2022-06-11 DIAGNOSIS — C3490 Malignant neoplasm of unspecified part of unspecified bronchus or lung: Secondary | ICD-10-CM | POA: Insufficient documentation

## 2022-06-11 DIAGNOSIS — J439 Emphysema, unspecified: Secondary | ICD-10-CM | POA: Diagnosis not present

## 2022-06-11 DIAGNOSIS — R911 Solitary pulmonary nodule: Secondary | ICD-10-CM | POA: Diagnosis not present

## 2022-06-11 LAB — CMP (CANCER CENTER ONLY)
ALT: 27 U/L (ref 0–44)
AST: 23 U/L (ref 15–41)
Albumin: 4.5 g/dL (ref 3.5–5.0)
Alkaline Phosphatase: 35 U/L — ABNORMAL LOW (ref 38–126)
Anion gap: 3 — ABNORMAL LOW (ref 5–15)
BUN: 17 mg/dL (ref 8–23)
CO2: 32 mmol/L (ref 22–32)
Calcium: 9.7 mg/dL (ref 8.9–10.3)
Chloride: 102 mmol/L (ref 98–111)
Creatinine: 1.25 mg/dL — ABNORMAL HIGH (ref 0.61–1.24)
GFR, Estimated: >60 mL/min (ref >60)
Glucose, Bld: 130 mg/dL — ABNORMAL HIGH (ref 70–99)
Potassium: 4.3 mmol/L (ref 3.5–5.1)
Sodium: 137 mmol/L (ref 135–145)
Total Bilirubin: 0.5 mg/dL (ref 0.3–1.2)
Total Protein: 7.3 g/dL (ref 6.5–8.1)

## 2022-06-11 LAB — CBC WITH DIFFERENTIAL (CANCER CENTER ONLY)
Abs Immature Granulocytes: 0.03 10*3/uL (ref 0.00–0.07)
Basophils Absolute: 0 10*3/uL (ref 0.0–0.1)
Basophils Relative: 0 %
Eosinophils Absolute: 0.1 10*3/uL (ref 0.0–0.5)
Eosinophils Relative: 1 %
HCT: 47.9 % (ref 39.0–52.0)
Hemoglobin: 15.4 g/dL (ref 13.0–17.0)
Immature Granulocytes: 0 %
Lymphocytes Relative: 17 %
Lymphs Abs: 1.1 10*3/uL (ref 0.7–4.0)
MCH: 27.2 pg (ref 26.0–34.0)
MCHC: 32.2 g/dL (ref 30.0–36.0)
MCV: 84.5 fL (ref 80.0–100.0)
Monocytes Absolute: 0.7 10*3/uL (ref 0.1–1.0)
Monocytes Relative: 10 %
Neutro Abs: 4.7 10*3/uL (ref 1.7–7.7)
Neutrophils Relative %: 72 %
Platelet Count: 200 10*3/uL (ref 150–400)
RBC: 5.67 MIL/uL (ref 4.22–5.81)
RDW: 14.6 % (ref 11.5–15.5)
WBC Count: 6.7 10*3/uL (ref 4.0–10.5)
nRBC: 0 % (ref 0.0–0.2)

## 2022-06-13 ENCOUNTER — Other Ambulatory Visit: Payer: Self-pay | Admitting: Internal Medicine

## 2022-06-13 ENCOUNTER — Encounter: Payer: Self-pay | Admitting: Internal Medicine

## 2022-06-13 DIAGNOSIS — G4700 Insomnia, unspecified: Secondary | ICD-10-CM

## 2022-06-13 MED ORDER — TRAZODONE HCL 100 MG PO TABS: 100.0000 mg | ORAL_TABLET | Freq: Every day | ORAL | 1 refills | Status: DC

## 2022-06-14 ENCOUNTER — Other Ambulatory Visit: Payer: Medicare Other

## 2022-06-18 ENCOUNTER — Encounter: Payer: Self-pay | Admitting: Internal Medicine

## 2022-06-18 ENCOUNTER — Inpatient Hospital Stay (HOSPITAL_BASED_OUTPATIENT_CLINIC_OR_DEPARTMENT_OTHER): Payer: Medicare Other | Admitting: Internal Medicine

## 2022-06-18 ENCOUNTER — Ambulatory Visit: Payer: Medicare Other | Attending: Cardiology | Admitting: *Deleted

## 2022-06-18 DIAGNOSIS — Z5181 Encounter for therapeutic drug level monitoring: Secondary | ICD-10-CM | POA: Diagnosis not present

## 2022-06-18 DIAGNOSIS — I480 Paroxysmal atrial fibrillation: Secondary | ICD-10-CM

## 2022-06-18 DIAGNOSIS — I639 Cerebral infarction, unspecified: Secondary | ICD-10-CM | POA: Diagnosis not present

## 2022-06-18 DIAGNOSIS — C3490 Malignant neoplasm of unspecified part of unspecified bronchus or lung: Secondary | ICD-10-CM

## 2022-06-18 LAB — POCT INR: INR: 2.7 (ref 2.0–3.0)

## 2022-06-18 NOTE — Patient Instructions (Signed)
Continue warfarin 1 tablet daily except 1/2 tablet on Tuesdays and Fridays Recheck INR in 6 weeks.

## 2022-06-18 NOTE — Progress Notes (Signed)
[place] [phone]   [phone]) [phone]  PROGRESS NOTE FOR TELEMEDICINE VISITS  Lindell Spar, MD [address] [place] [address]  I connected withNAME@ on 06/18/22 at  8:45 AM EDT by telephone visit and verified that I am speaking with the correct person using two identifiers.   I discussed the limitations, risks, security and privacy concerns of performing an evaluation and management service by telemedicine and the availability of in-person appointments. I also discussed with the patient that there may be a patient responsible charge related to this service. The patient expressed understanding and agreed to proceed.  Other persons participating in the visit and their role in the encounter: Wife [name]  Patient's location: Home Provider's location: [place]  DIAGNOSIS:  1) Stage IA (T1b, N0, M0) non-small cell lung cancer, well-differentiated adenocarcinoma presented with left upper lobeS pulmonary nodule diagnosed in September 2017. 2) stage Ia non-small cell lung cancer diagnosed in March 2020.   PRIOR THERAPY:  1) Status post left VATS, left upper lobectomy with mediastinal lymph node dissection under the care of Dr. Roxan Hockey on 08/01/2016. 2) status post right VATS with wedge resection of right upper lobe nodule and bleb resection under the care of Dr. Roxan Hockey on December 10, 2018.   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  INTERVAL HISTORY: Randall Sullivan 71 y.o. male has a telephone virtual visit with me today for evaluation and discussion of his scan results.  The patient is feeling fine with no concerning complaints except for the memory problem after his stroke.  He also has some imbalance of his gait and weakness on the right side.  He has no chest pain but  continues to have shortness of breath at baseline increased with exertion with no cough or hemoptysis.  He has no recent weight loss or night sweats.  He has no headache or visual changes.  He had repeat CT scan of the chest performed recently and we are having the visit to discuss the scan results.  MEDICAL HISTORY: Past Medical History:  Diagnosis Date   A-fib [facility]) 2017   after last lung surgery patient had a history if Afib documented in hospital    Acute appendicitis 07/14/2021   Allergy    Arthritis    back & legs ( knees)   Cancer (HCC)    lung   Complication of anesthesia    agitated upon waking from anesth.    Emphysema    History of lung cancer    Hyperlipidemia    Hypertension    Oxygen dependent    2L continuous   Stroke ([place])    Tobacco abuse     ALLERGIES:  is allergic to no known allergies.  MEDICATIONS:  Current Outpatient Medications  Medication Sig Dispense Refill   acetaminophen (TYLENOL) 325 MG tablet Take 2 tablets (650 mg total) by mouth every 6 (six) hours as needed for mild pain (or Fever >/= 101).     albuterol (PROVENTIL) (2.5 MG/3ML) 0.083% nebulizer solution USE 1 VIAL IN NEBULIZER EVERY 6 HOURS AS NEEDED FOR WHEEZING AND FOR SHORTNESS OF BREATH 150 mL 0   albuterol (PROVENTIL) (2.5 MG/3ML) 0.083% nebulizer solution USE 1 VIAL IN NEBULIZER EVERY 6 HOURS AS NEEDED FOR WHEEZING OR SHORTNESS OF BREATH 180 mL 2   Cholecalciferol (VITAMIN D3) 125  MCG (5000 UT) CAPS Take 1 capsule (5,000 Units total) by mouth daily. 30 capsule 0   diltiazem (CARDIZEM CD) 120 MG 24 hr capsule Take 1 capsule by mouth once daily 30 capsule 5   fenofibrate (TRICOR) 145 MG tablet Take 1 tablet (145 mg total) by mouth daily. 90 tablet 1   ipratropium-albuterol (DUONEB) 0.5-2.5 (3) MG/3ML SOLN Take 3 mLs by nebulization every 6 (six) hours as needed. 360 mL 3   loratadine (CLARITIN) 10 MG tablet Take 1 tablet (10 mg total) by mouth daily. 90 tablet 1   meclizine (ANTIVERT) 25 MG  tablet Take 1 tablet (25 mg total) by mouth 3 (three) times daily as needed for dizziness. 30 tablet 0   metoprolol succinate (TOPROL-XL) 50 MG 24 hr tablet TAKE 1 TABLET BY MOUTH ONCE DAILY WITH OR IMMEDIATELY FOLLOWING A MEAL 90 tablet 0   OXYGEN Inhale 2-3 L into the lungs continuous. Hx of lung ca, copd, and emphysema     polyethylene glycol (MIRALAX / GLYCOLAX) 17 g packet Take 17 g by mouth daily as needed for mild constipation. 14 each 0   pravastatin (PRAVACHOL) 80 MG tablet Take 1 tablet (80 mg total) by mouth daily. 30 tablet 5   tamsulosin (FLOMAX) 0.4 MG CAPS capsule Take 1 capsule by mouth once daily 90 capsule 3   traZODone (DESYREL) 100 MG tablet Take 1 tablet (100 mg total) by mouth at bedtime. 90 tablet 1   warfarin (COUMADIN) 5 MG tablet Take 1 tablet (5 mg total) by mouth daily. 90 tablet 1   No current facility-administered medications for this visit.    SURGICAL HISTORY:  Past Surgical History:  Procedure Laterality Date   HERNIA REPAIR Bilateral 0626   umbilical   LAPAROSCOPIC APPENDECTOMY N/A 07/15/2021   Procedure: APPENDECTOMY LAPAROSCOPIC;  Surgeon: Virl Cagey, MD;  Location: [facility];  Service: General;  Laterality: N/A;   VIDEO ASSISTED THORACOSCOPY Right 12/21/2018   Procedure: VIDEO ASSISTED THORACOSCOPY AND RESECTION OF BLEBS RIGHT LOWER LOBE;  Surgeon: [name], MD;  Location: [place];  Service: Thoracic;  Laterality: Right;   VIDEO ASSISTED THORACOSCOPY (VATS)/ LOBECTOMY Left 08/01/2016   Procedure: VIDEO ASSISTED THORACOSCOPY (VATS)/ LEFT UPPER LOBECTOMY with lymph node sampling and onQ placement;  Surgeon: [name], MD;  Location: [place];  Service: Thoracic;  Laterality: Left;   VIDEO ASSISTED THORACOSCOPY (VATS)/WEDGE RESECTION Right 12/09/2018   Procedure: VIDEO ASSISTED THORACOSCOPY (VATS)/WEDGE RESECTION;  Surgeon: [name], MD;  Location: [place];  Service: Thoracic;  Laterality: Right;   VIDEO BRONCHOSCOPY  Bilateral 12/21/2018   Procedure: VIDEO BRONCHOSCOPY;  Surgeon: [name], MD;  Location: [place];  Service: Thoracic;  Laterality: Bilateral;   VIDEO BRONCHOSCOPY WITH ENDOBRONCHIAL NAVIGATION N/A 07/03/2016   Procedure: VIDEO BRONCHOSCOPY WITH ENDOBRONCHIAL NAVIGATION;  Surgeon: [name], MD;  Location: [place];  Service: Thoracic;  Laterality: N/A;   VIDEO BRONCHOSCOPY WITH INSERTION OF INTERBRONCHIAL VALVE (IBV) N/A 12/11/2018   Procedure: VIDEO BRONCHOSCOPY WITH INSERTION OF INTERBRONCHIAL VALVE (IBV);  Surgeon: [name], MD;  Location: [facility];  Service: Thoracic;  Laterality: N/A;   VIDEO BRONCHOSCOPY WITH INSERTION OF INTERBRONCHIAL VALVE (IBV) N/A 02/10/2019   Procedure: VIDEO BRONCHOSCOPY WITH REMOVAL OF INTERBRONCHIAL VALVE (IBV);  Surgeon: [name], MD;  Location: [facility] OR;  Service: Thoracic;  Laterality: N/A;    REVIEW OF SYSTEMS:  A comprehensive review of systems was negative except for: Constitutional: positive for fatigue Respiratory: positive for dyspnea  on exertion Neurological: positive for coordination problems and memory problems    LABORATORY DATA: Lab Results  Component Value Date   WBC 6.7 06/11/2022   HGB 15.4 06/11/2022   HCT 47.9 06/11/2022   MCV 84.5 06/11/2022   PLT 200 06/11/2022      Chemistry      Component Value Date/Time   NA 137 06/11/2022 1132   NA 137 04/09/2022 1551   NA 136 04/09/2017 1249   K 4.3 06/11/2022 1132   K 4.6 04/09/2017 1249   CL 102 06/11/2022 1132   CO2 32 06/11/2022 1132   CO2 27 04/09/2017 1249   BUN 17 06/11/2022 1132   BUN 17 04/09/2022 1551   BUN 23.9 04/09/2017 1249   CREATININE 1.25 (H) 06/11/2022 1132   CREATININE 1.21 (H) 05/08/2020 1051   CREATININE 1.4 (H) 04/09/2017 1249      Component Value Date/Time   CALCIUM 9.7 06/11/2022 1132   CALCIUM 9.6 04/09/2017 1249   ALKPHOS 35 (L) 06/11/2022 1132   ALKPHOS 58 04/09/2017 1249   AST 23 06/11/2022 1132   AST 13  04/09/2017 1249   ALT 27 06/11/2022 1132   ALT 9 04/09/2017 1249   BILITOT 0.5 06/11/2022 1132   BILITOT 1.01 04/09/2017 1249       RADIOGRAPHIC STUDIES: CT Chest Wo Contrast  Result Date: 06/12/2022 CLINICAL DATA:  Restaging non-small cell cancer. EXAM: CT CHEST WITHOUT CONTRAST TECHNIQUE: Multidetector CT imaging of the chest was performed following the standard protocol without IV contrast. RADIATION DOSE REDUCTION: This exam was performed according to the departmental dose-optimization program which includes automated exposure control, adjustment of the mA and/or kV according to patient size and/or use of iterative reconstruction technique. COMPARISON:  Multiple previous imaging studies. The most recent CT scan of the chest is 06/05/2021 FINDINGS: Cardiovascular: The heart is normal in size. Small amount of pericardial fluid but no overt effusion. Stable tortuosity, ectasia and calcification of the thoracic aorta. Mediastinum/Nodes: Stable small scattered mediastinal and hilar lymph nodes. No mass or overt adenopathy. The esophagus is grossly normal and stable. Lungs/Pleura: Stable surgical changes from a prior left upper lobe lobectomy and right upper lobe wedge resection. Similar soft tissue thickening along the suture in the right upper lobe with maximal thickness of 12 mm similar to prior study. Unchanged 4 mm right lower lobe ground-glass nodule image 54/5. No new pulmonary lesions or pulmonary nodules. Acute overlying pulmonary process. No pleural effusions or pleural nodules. Stable underlying emphysematous changes and pulmonary scarring. Upper Abdomen: No significant upper abdominal findings. Stable fatty infiltration of liver. No hepatic or adrenal gland lesions are identified. No upper abdominal adenopathy. Musculoskeletal: No chest wall mass, supraclavicular or axillary adenopathy. The bony thorax is intact. IMPRESSION: 1. Stable surgical changes from a prior left upper lobe lobectomy and  right upper lobe wedge resection. 2. Stable soft tissue thickening along the suture line in the right upper lobe. 3. Stable 4 mm right lower lobe ground-glass nodule. 4. No new pulmonary lesions or pulmonary nodules. 5. Stable emphysematous changes and pulmonary scarring. 6. No mediastinal or hilar mass or adenopathy. 7. Stable fatty infiltration of the liver. Aortic Atherosclerosis (ICD10-I70.0) and Emphysema (ICD10-J43.9). Electronically Signed   By: Marijo Sanes M.D.   On: 06/12/2022 11:11    ASSESSMENT AND PLAN: This is a very pleasant 71 years old white male with a stage IA non-small cell lung cancer status post left lower lobectomy with lymph node dissection under the care of Dr. Roxan Hockey  in November 2017 and has been observation since that time. He was also diagnosed with stage IA non-small cell lung cancer in March 2020 and the patient underwent wedge resection of the upper lobe in March 2020. The patient has been on observation since that time and he is feeling fine with no concerning complaints except for the memory issues and imbalance of his gait after the stroke. He had repeat CT scan of the chest performed recently.  I personally and independently reviewed the scans and discussed the result with the patient and his wife. His scan showed no concerning findings for disease recurrence or metastasis. I recommended for the patient to continue on observation with repeat CT scan of the chest in 1 year. The patient was advised to call immediately if he has any other concerning symptoms in the interval. I discussed the assessment and treatment plan with the patient. The patient was provided an opportunity to ask questions and all were answered. The patient agreed with the plan and demonstrated an understanding of the instructions.   The patient was advised to call back or seek an in-person evaluation if the symptoms worsen or if the condition fails to improve as anticipated.  I provided 15  minutes of non face-to-face telephone visit time during this encounter, and > 50% was spent counseling as documented under my assessment & plan.  Eilleen Kempf, MD 06/18/2022 8:24 AM  Disclaimer: This note was dictated with voice recognition software. Similar sounding words can inadvertently be transcribed and may not be corrected upon review.

## 2022-06-26 ENCOUNTER — Encounter: Payer: Self-pay | Admitting: Cardiology

## 2022-06-27 ENCOUNTER — Encounter: Payer: Self-pay | Admitting: Internal Medicine

## 2022-06-27 ENCOUNTER — Other Ambulatory Visit: Payer: Self-pay

## 2022-06-27 MED ORDER — METOPROLOL SUCCINATE ER 50 MG PO TB24: ORAL_TABLET | ORAL | 1 refills | Status: DC

## 2022-06-30 ENCOUNTER — Encounter: Payer: Self-pay | Admitting: Internal Medicine

## 2022-07-01 ENCOUNTER — Other Ambulatory Visit: Payer: Self-pay | Admitting: *Deleted

## 2022-07-01 DIAGNOSIS — C3492 Malignant neoplasm of unspecified part of left bronchus or lung: Secondary | ICD-10-CM

## 2022-07-01 DIAGNOSIS — E785 Hyperlipidemia, unspecified: Secondary | ICD-10-CM

## 2022-07-01 MED ORDER — PRAVASTATIN SODIUM 80 MG PO TABS: 80.0000 mg | ORAL_TABLET | Freq: Every day | ORAL | 5 refills | Status: DC

## 2022-07-01 MED ORDER — FENOFIBRATE 145 MG PO TABS: 145.0000 mg | ORAL_TABLET | Freq: Every day | ORAL | 1 refills | Status: DC

## 2022-07-01 MED ORDER — ALBUTEROL SULFATE (2.5 MG/3ML) 0.083% IN NEBU: INHALATION_SOLUTION | RESPIRATORY_TRACT | 2 refills | Status: DC

## 2022-07-15 ENCOUNTER — Other Ambulatory Visit: Payer: Self-pay | Admitting: *Deleted

## 2022-07-15 ENCOUNTER — Other Ambulatory Visit: Payer: Self-pay | Admitting: Internal Medicine

## 2022-07-15 ENCOUNTER — Encounter: Payer: Self-pay | Admitting: Internal Medicine

## 2022-07-15 MED ORDER — DILTIAZEM HCL ER COATED BEADS 120 MG PO CP24: 120.0000 mg | ORAL_CAPSULE | Freq: Every day | ORAL | 5 refills | Status: DC

## 2022-07-30 ENCOUNTER — Ambulatory Visit: Payer: Medicare Other | Attending: Cardiology | Admitting: *Deleted

## 2022-07-30 DIAGNOSIS — I480 Paroxysmal atrial fibrillation: Secondary | ICD-10-CM | POA: Diagnosis not present

## 2022-07-30 DIAGNOSIS — Z5181 Encounter for therapeutic drug level monitoring: Secondary | ICD-10-CM | POA: Diagnosis not present

## 2022-07-30 DIAGNOSIS — I639 Cerebral infarction, unspecified: Secondary | ICD-10-CM | POA: Diagnosis not present

## 2022-07-30 LAB — POCT INR: INR: 2.7 (ref 2.0–3.0)

## 2022-07-30 NOTE — Patient Instructions (Signed)
Continue warfarin 1 tablet daily except 1/2 tablet on Tuesdays and Fridays Recheck INR in 6 weeks.

## 2022-08-11 ENCOUNTER — Encounter: Payer: Self-pay | Admitting: Internal Medicine

## 2022-08-12 ENCOUNTER — Other Ambulatory Visit: Payer: Self-pay

## 2022-08-12 DIAGNOSIS — C3492 Malignant neoplasm of unspecified part of left bronchus or lung: Secondary | ICD-10-CM

## 2022-08-12 MED ORDER — ALBUTEROL SULFATE (2.5 MG/3ML) 0.083% IN NEBU: INHALATION_SOLUTION | RESPIRATORY_TRACT | 0 refills | Status: DC

## 2022-08-12 NOTE — Telephone Encounter (Signed)
Wanting to know if he can take mucinex with his other meds? Pls advise

## 2022-08-13 ENCOUNTER — Encounter: Payer: Self-pay | Admitting: Internal Medicine

## 2022-08-13 NOTE — Telephone Encounter (Signed)
Yes, he can

## 2022-08-29 ENCOUNTER — Other Ambulatory Visit: Payer: Self-pay

## 2022-08-29 ENCOUNTER — Encounter: Payer: Self-pay | Admitting: Internal Medicine

## 2022-08-29 DIAGNOSIS — C3492 Malignant neoplasm of unspecified part of left bronchus or lung: Secondary | ICD-10-CM

## 2022-08-29 MED ORDER — ALBUTEROL SULFATE (2.5 MG/3ML) 0.083% IN NEBU: INHALATION_SOLUTION | RESPIRATORY_TRACT | 2 refills | Status: DC

## 2022-09-03 ENCOUNTER — Encounter: Payer: Self-pay | Admitting: Cardiology

## 2022-09-03 DIAGNOSIS — R931 Abnormal findings on diagnostic imaging of heart and coronary circulation: Secondary | ICD-10-CM | POA: Insufficient documentation

## 2022-09-03 DIAGNOSIS — I714 Abdominal aortic aneurysm, without rupture, unspecified: Secondary | ICD-10-CM | POA: Insufficient documentation

## 2022-09-03 NOTE — Progress Notes (Unsigned)
Cardiology Office Note   Date:  09/04/2022   ID:  Randall Sullivan, DOB [birth year], MRN [id number]  PCP:  Lindell Spar, MD  Cardiologist:   None Referring:  Lindell Spar, MD  Chief Complaint  Patient presents with   Atrial Fibrillation       History of Present Illness: Randall Sullivan is a 71 y.o. male who presents for evaluation of coronary calcium .  He is referred by Lindell Spar, MD    He was found on a CT of his chest to have 2 vessel coronary calcium.  This was done to follow up resection of a lung cancer.  I saw him in 2017.  He had resection of lung cancer then as well and had some atrial fibrillation post surgery.  He had had resection of another lung cancer this time on the other side (right) last year.  I reviewed the records from the hospitalization and with a complicated course.  He had a VATS for blebectomy and wedge resection of the right upper lobe nodule.  This was a non-small cell lung cancer.  He had a large air leak postoperatively and had to have redo VATS.  He reports being in atrial fibrillation transiently but I do not see a discharge summary that corroborates this.    I saw him last year for evaluation of coronary calcium.  I suggested a POET (Plain Old Exercise Treadmill) but he called to cancel this. He was in the hospital with CVA in October.  CT/MRI/MRI of the head showed acute left large PCA territory infarction.  He had left PCA occlusion at the P1 P2 junction.  Small acute right thalamic infarction.  No hemorrhage or mass-effect.  He did not receive tPA.  Carotid Dopplers demonstrated no ICA stenosis.  Echocardiogram demonstrated an ejection fraction of 50 to 67% grade 1 diastolic dysfunction.   He was treated with warfarin.   He has residual right-sided weakness, some speech problems and short-term memory issues.  He is dizzy.  He walks with a cane.  Since I last saw him he has had no new complaints.  He did have his appendix out and had a CT and was found  there to have a 4.0 x 3.8 cm abdominal aortic aneurysm with some thrombus.  He gets around still carefully.  He has not had any falls or presyncope.  He has some memory problems.  He denies any new symptoms such as chest pressure, neck or arm discomfort.  He had no new shortness of breath, PND or orthopnea.  He had no palpitations, presyncope or syncope.  He had no weight gain or edema.   Past Medical History:  Diagnosis Date   A-fib [facility]) 2017   after last lung surgery patient had a history if Afib documented in hospital    Acute appendicitis 07/14/2021   Arthritis    back & legs ( knees)   Cancer (HCC)    lung   Complication of anesthesia    agitated upon waking from anesth.    Emphysema    Hyperlipidemia    Hypertension    Oxygen dependent    2L continuous   Stroke ([place])    Tobacco abuse     Past Surgical History:  Procedure Laterality Date   HERNIA REPAIR Bilateral 1245   umbilical   LAPAROSCOPIC APPENDECTOMY N/A 07/15/2021   Procedure: APPENDECTOMY LAPAROSCOPIC;  Surgeon: Virl Cagey, MD;  [facility];  Service: General;  Laterality: N/A;   VIDEO ASSISTED THORACOSCOPY Right 12/21/2018   Procedure: VIDEO ASSISTED THORACOSCOPY AND RESECTION OF BLEBS RIGHT LOWER LOBE;  Surgeon: [name], MD;  Location: [place];  Service: Thoracic;  Laterality: Right;   VIDEO ASSISTED THORACOSCOPY (VATS)/ LOBECTOMY Left 08/01/2016   Procedure: VIDEO ASSISTED THORACOSCOPY (VATS)/ LEFT UPPER LOBECTOMY with lymph node sampling and onQ placement;  Surgeon: [name], MD;  Location: [place];  Service: Thoracic;  Laterality: Left;   VIDEO ASSISTED THORACOSCOPY (VATS)/WEDGE RESECTION Right 12/09/2018   Procedure: VIDEO ASSISTED THORACOSCOPY (VATS)/WEDGE RESECTION;  Surgeon: [name], MD;  Location: [place];  Service: Thoracic;  Laterality: Right;   VIDEO BRONCHOSCOPY Bilateral 12/21/2018   Procedure: VIDEO BRONCHOSCOPY;  Surgeon: [name], MD;   Location: [place];  Service: Thoracic;  Laterality: Bilateral;   VIDEO BRONCHOSCOPY WITH ENDOBRONCHIAL NAVIGATION N/A 07/03/2016   Procedure: VIDEO BRONCHOSCOPY WITH ENDOBRONCHIAL NAVIGATION;  Surgeon: [name], MD;  Location: [place];  Service: Thoracic;  Laterality: N/A;   VIDEO BRONCHOSCOPY WITH INSERTION OF INTERBRONCHIAL VALVE (IBV) N/A 12/11/2018   Procedure: VIDEO BRONCHOSCOPY WITH INSERTION OF INTERBRONCHIAL VALVE (IBV);  Surgeon: [name], MD;  Location: [facility] OR;  Service: Thoracic;  Laterality: N/A;   VIDEO BRONCHOSCOPY WITH INSERTION OF INTERBRONCHIAL VALVE (IBV) N/A 02/10/2019   Procedure: VIDEO BRONCHOSCOPY WITH REMOVAL OF INTERBRONCHIAL VALVE (IBV);  Surgeon: [name], MD;  Location: [facility] OR;  Service: Thoracic;  Laterality: N/A;     Current Outpatient Medications  Medication Sig Dispense Refill   acetaminophen (TYLENOL) 325 MG tablet Take 2 tablets (650 mg total) by mouth every 6 (six) hours as needed for mild pain (or Fever >/= 101).     albuterol (PROVENTIL) (2.5 MG/3ML) 0.083% nebulizer solution USE 1 VIAL IN NEBULIZER EVERY 6 HOURS AS NEEDED FOR WHEEZING AND FOR SHORTNESS OF BREATH 150 mL 0   albuterol (PROVENTIL) (2.5 MG/3ML) 0.083% nebulizer solution USE 1 VIAL IN NEBULIZER EVERY 6 HOURS AS NEEDED FOR WHEEZING OR SHORTNESS OF BREATH 180 mL 2   atorvastatin (LIPITOR) 40 MG tablet Take 1 tablet (40 mg total) by mouth daily. 90 tablet 3   Cholecalciferol (VITAMIN D3) 125 MCG (5000 UT) CAPS Take 1 capsule (5,000 Units total) by mouth daily. 30 capsule 0   diltiazem (CARDIZEM CD) 180 MG 24 hr capsule Take 1 capsule (180 mg total) by mouth daily. 90 capsule 3   fenofibrate (TRICOR) 145 MG tablet Take 1 tablet (145 mg total) by mouth daily. 90 tablet 1   ipratropium-albuterol (DUONEB) 0.5-2.5 (3) MG/3ML SOLN Take 3 mLs by nebulization every 6 (six) hours as needed. 360 mL 3   loratadine (CLARITIN) 10 MG tablet Take 1 tablet (10 mg total) by mouth daily.  90 tablet 1   meclizine (ANTIVERT) 25 MG tablet Take 1 tablet (25 mg total) by mouth 3 (three) times daily as needed for dizziness. 30 tablet 0   metoprolol succinate (TOPROL-XL) 50 MG 24 hr tablet TAKE 1 TABLET BY MOUTH ONCE DAILY WITH OR IMMEDIATELY FOLLOWING A MEAL 90 tablet 1   OXYGEN Inhale 2-3 L into the lungs continuous. Hx of lung ca, copd, and emphysema     polyethylene glycol (MIRALAX / GLYCOLAX) 17 g packet Take 17 g by mouth daily as needed for mild constipation. 14 each 0   tamsulosin (FLOMAX) 0.4 MG CAPS capsule Take 1 capsule by mouth once daily 90 capsule 3   traZODone (DESYREL) 100 MG tablet Take 1 tablet (100 mg  total) by mouth at bedtime. 90 tablet 1   warfarin (COUMADIN) 5 MG tablet Take 1 tablet (5 mg total) by mouth daily. 90 tablet 1   No current facility-administered medications for this visit.    Allergies:   No known allergies    ROS:  Please see the history of present illness.   Otherwise, review of systems are positive for none.   All other systems are reviewed and negative.    PHYSICAL EXAM: VS:  BP (!) 156/84   Pulse 68   Ht 5\' 10"  (1.778 m)   Wt 224 lb 9.6 oz (101.9 kg)   SpO2 94%   BMI 32.23 kg/m  , BMI Body mass index is 32.23 kg/m. GENERAL:  Well appearing NECK:  No jugular venous distention, waveform within normal limits, carotid upstroke brisk and symmetric, no bruits, no thyromegaly LUNGS:  Clear to auscultation bilaterally CHEST:  Unremarkable HEART:  PMI not displaced or sustained,S1 and S2 within normal limits, no S3, no S4, no clicks, no rubs, no murmurs ABD:  Flat, positive bowel sounds normal in frequency in pitch, no bruits, no rebound, no guarding, no midline pulsatile mass, no hepatomegaly, no splenomegaly EXT:  2 plus pulses throughout, no edema, no cyanosis no clubbing   EKG:  EKG is not ordered today. The ekg ordered 03/29/2022 demonstrates sinus rhythm, rate 88, axis within normal limits, intervals within normal limits, no acute  ST-T wave changes.   Recent Labs: 03/29/2022: B Natriuretic Peptide 120.0 06/11/2022: ALT 27; BUN 17; Creatinine 1.25; Hemoglobin 15.4; Platelet Count 200; Potassium 4.3; Sodium 137    Lipid Panel    Component Value Date/Time   CHOL 253 (H) 04/09/2022 1551   TRIG 730 (HH) 04/09/2022 1551   HDL 32 (L) 04/09/2022 1551   CHOLHDL 7.9 (H) 04/09/2022 1551   CHOLHDL 8.3 03/12/2021 1725   VLDL UNABLE TO CALCULATE IF TRIGLYCERIDE OVER 400 mg/dL 03/12/2021 1725   LDLCALC 97 04/09/2022 1551   LDLCALC  05/08/2020 1051     Comment:     . LDL cholesterol not calculated. Triglyceride levels greater than 400 mg/dL invalidate calculated LDL results. . Reference range: <100 . Desirable range <100 mg/dL for primary prevention;   <70 mg/dL for patients with CHD or diabetic patients  with > or = 2 CHD risk factors. [name] LDL-C is now calculated using the Martin-Hopkins  calculation, which is a validated novel method providing  better accuracy than the Friedewald equation in the  estimation of LDL-C.  Cresenciano Genre et al. Annamaria Helling. 5427;062(37): 2061-2068  (http://education.[employer].com/faq/FAQ164)    LDLDIRECT 75.9 03/12/2021 1725      Wt Readings from Last 3 Encounters:  09/04/22 224 lb 9.6 oz (101.9 kg)  04/09/22 223 lb 9.6 oz (101.4 kg)  03/29/22 220 lb (99.8 kg)      Other studies Reviewed: Additional studies/ records that were reviewed today include: CT and labs Review of the above records demonstrates:  Please see elsewhere in the note.     ASSESSMENT AND PLAN:  ELEVATED CORONARY CALCIUM:    He has no new symptoms.  I will manage him with aggressive risk reduction.  HTN: His blood pressure is at target.  No change in therapy.  DYSLIPIDEMIA: His LDL was mildly elevated to 85.  I will change him from pravastatin to Lipitor 40 and he should get a lipid profile in about 10 weeks after that.   ATRIAL FIB:   He has had no symptomatic recurrence.  He continues on warfarin.  AAA:    4.0 X 3.8.   He will have an ultrasound.     Current medicines are reviewed at length with the patient today.  The patient does not have concerns regarding medicines.  The following changes have been made: As above  Labs/ tests ordered today include:     Orders Placed This Encounter  Procedures   US AORTA   Lipid panel    Disposition:   FU with me in 1 year   Signed, Minus Breeding, MD  09/04/2022 12:46 PM    [place]

## 2022-09-04 ENCOUNTER — Other Ambulatory Visit: Payer: Self-pay | Admitting: *Deleted

## 2022-09-04 ENCOUNTER — Encounter: Payer: Self-pay | Admitting: Cardiology

## 2022-09-04 ENCOUNTER — Ambulatory Visit (INDEPENDENT_AMBULATORY_CARE_PROVIDER_SITE_OTHER): Payer: Medicare Other | Admitting: Cardiology

## 2022-09-04 VITALS — BP 156/84 | HR 68 | Ht 70.0 in | Wt 224.6 lb

## 2022-09-04 DIAGNOSIS — I714 Abdominal aortic aneurysm, without rupture, unspecified: Secondary | ICD-10-CM | POA: Diagnosis not present

## 2022-09-04 DIAGNOSIS — Z79899 Other long term (current) drug therapy: Secondary | ICD-10-CM | POA: Diagnosis not present

## 2022-09-04 DIAGNOSIS — E785 Hyperlipidemia, unspecified: Secondary | ICD-10-CM

## 2022-09-04 DIAGNOSIS — R931 Abnormal findings on diagnostic imaging of heart and coronary circulation: Secondary | ICD-10-CM | POA: Diagnosis not present

## 2022-09-04 DIAGNOSIS — I480 Paroxysmal atrial fibrillation: Secondary | ICD-10-CM | POA: Diagnosis not present

## 2022-09-04 DIAGNOSIS — I10 Essential (primary) hypertension: Secondary | ICD-10-CM | POA: Diagnosis not present

## 2022-09-04 MED ORDER — DILTIAZEM HCL ER COATED BEADS 180 MG PO CP24: 180.0000 mg | ORAL_CAPSULE | Freq: Every day | ORAL | 3 refills | Status: DC

## 2022-09-04 MED ORDER — ATORVASTATIN CALCIUM 40 MG PO TABS: 40.0000 mg | ORAL_TABLET | Freq: Every day | ORAL | 3 refills | Status: DC

## 2022-09-04 NOTE — Patient Instructions (Signed)
Medication Instructions:  Please increase Cardizem to 180 mg a day. Start Lipitor  (Atorvastatin) 40 mg a day. Continue all other medications as listed.  *If you need a refill on your cardiac medications before your next appointment, please call your pharmacy*   Lab Work: Please have blood work 10 weeks after start Atorvastatin. Can be completed at any [facility].  If you have labs (blood work) drawn today and your tests are completely normal, you will receive your results only by: [place] (if you have MyChart) OR A paper copy in the mail If you have any lab test that is abnormal or we need to change your treatment, we will call you to review the results.   Testing/Procedures: Your physician has requested that you have an abdominal aorta duplex. During this test, an ultrasound is used to evaluate the aorta. Allow 30 minutes for this exam. Do not eat after midnight the day before and avoid carbonated beverages This will be completed at [facility].  You will be contacted to be scheduled.   Follow-Up: At [facility], you and your health needs are our priority.  As part of our continuing mission to provide you with exceptional heart care, we have created designated Provider Care Teams.  These Care Teams include your primary Cardiologist (physician) and Advanced Practice Providers (APPs -  Physician Assistants and Nurse Practitioners) who all work together to provide you with the care you need, when you need it.  We recommend signing up for the patient portal called "MyChart".  Sign up information is provided on this After Visit Summary.  MyChart is used to connect with patients for Virtual Visits (Telemedicine).  Patients are able to view lab/test results, encounter notes, upcoming appointments, etc.  Non-urgent messages can be sent to your provider as well.   To learn more about what you can do with MyChart, go to [email or web address].    Your next appointment:   1  year(s)  The format for your next appointment:   In Person  Provider:   Minus Breeding, MD     Important Information About Sugar

## 2022-09-10 ENCOUNTER — Ambulatory Visit: Payer: Medicare Other | Attending: Cardiology | Admitting: *Deleted

## 2022-09-10 DIAGNOSIS — I639 Cerebral infarction, unspecified: Secondary | ICD-10-CM | POA: Diagnosis not present

## 2022-09-10 DIAGNOSIS — Z5181 Encounter for therapeutic drug level monitoring: Secondary | ICD-10-CM

## 2022-09-10 DIAGNOSIS — I480 Paroxysmal atrial fibrillation: Secondary | ICD-10-CM | POA: Diagnosis not present

## 2022-09-10 LAB — POCT INR: INR: 3.3 — AB (ref 2.0–3.0)

## 2022-09-10 NOTE — Patient Instructions (Signed)
Hold warfarin tonight then resume 1 tablet daily except 1/2 tablet on Tuesdays and Fridays Recheck INR in 6 weeks.

## 2022-09-17 ENCOUNTER — Ambulatory Visit (HOSPITAL_BASED_OUTPATIENT_CLINIC_OR_DEPARTMENT_OTHER): Payer: Medicare Other | Admitting: Internal Medicine

## 2022-09-17 ENCOUNTER — Ambulatory Visit (HOSPITAL_COMMUNITY)
Admission: RE | Admit: 2022-09-17 | Discharge: 2022-09-17 | Disposition: A | Discharge status: Home / Self Care | Payer: Medicare Other | Source: Ambulatory Visit | Attending: Cardiology | Admitting: Cardiology

## 2022-09-17 DIAGNOSIS — Z136 Encounter for screening for cardiovascular disorders: Secondary | ICD-10-CM | POA: Diagnosis not present

## 2022-09-17 DIAGNOSIS — I714 Abdominal aortic aneurysm, without rupture, unspecified: Secondary | ICD-10-CM | POA: Diagnosis not present

## 2022-10-11 ENCOUNTER — Encounter: Payer: Self-pay | Admitting: Internal Medicine

## 2022-10-11 ENCOUNTER — Other Ambulatory Visit: Payer: Self-pay

## 2022-10-11 DIAGNOSIS — C3492 Malignant neoplasm of unspecified part of left bronchus or lung: Secondary | ICD-10-CM

## 2022-10-11 MED ORDER — ALBUTEROL SULFATE (2.5 MG/3ML) 0.083% IN NEBU: INHALATION_SOLUTION | RESPIRATORY_TRACT | 2 refills | Status: DC

## 2022-10-15 ENCOUNTER — Ambulatory Visit: Payer: Medicare Other | Admitting: Internal Medicine

## 2022-10-17 ENCOUNTER — Encounter: Payer: Self-pay | Admitting: Internal Medicine

## 2022-10-18 ENCOUNTER — Other Ambulatory Visit: Payer: Self-pay

## 2022-10-18 DIAGNOSIS — Z9109 Other allergy status, other than to drugs and biological substances: Secondary | ICD-10-CM

## 2022-10-18 MED ORDER — LORATADINE 10 MG PO TABS: 10.0000 mg | ORAL_TABLET | Freq: Every day | ORAL | 1 refills | Status: DC

## 2022-10-22 ENCOUNTER — Ambulatory Visit: Payer: Medicare Other | Attending: Cardiology | Admitting: *Deleted

## 2022-10-22 DIAGNOSIS — Z5181 Encounter for therapeutic drug level monitoring: Secondary | ICD-10-CM | POA: Diagnosis not present

## 2022-10-22 DIAGNOSIS — I639 Cerebral infarction, unspecified: Secondary | ICD-10-CM

## 2022-10-22 DIAGNOSIS — I480 Paroxysmal atrial fibrillation: Secondary | ICD-10-CM

## 2022-10-22 LAB — POCT INR: INR: 2.5 (ref 2.0–3.0)

## 2022-10-22 NOTE — Patient Instructions (Signed)
Continue warfarin 1 tablet daily except 1/2 tablet on Tuesdays and Fridays Recheck INR in 6 weeks.

## 2022-10-28 ENCOUNTER — Encounter: Payer: Medicare Other | Admitting: Internal Medicine

## 2022-10-29 ENCOUNTER — Encounter: Payer: Self-pay | Admitting: Internal Medicine

## 2022-10-29 ENCOUNTER — Ambulatory Visit (INDEPENDENT_AMBULATORY_CARE_PROVIDER_SITE_OTHER): Payer: Medicare Other | Admitting: Internal Medicine

## 2022-10-29 VITALS — BP 138/84 | HR 68 | Ht 70.0 in | Wt 226.6 lb

## 2022-10-29 DIAGNOSIS — J439 Emphysema, unspecified: Secondary | ICD-10-CM

## 2022-10-29 DIAGNOSIS — R351 Nocturia: Secondary | ICD-10-CM | POA: Diagnosis not present

## 2022-10-29 DIAGNOSIS — I480 Paroxysmal atrial fibrillation: Secondary | ICD-10-CM | POA: Diagnosis not present

## 2022-10-29 DIAGNOSIS — E1169 Type 2 diabetes mellitus with other specified complication: Secondary | ICD-10-CM

## 2022-10-29 DIAGNOSIS — I7143 Infrarenal abdominal aortic aneurysm, without rupture: Secondary | ICD-10-CM | POA: Diagnosis not present

## 2022-10-29 DIAGNOSIS — I700 Atherosclerosis of aorta: Secondary | ICD-10-CM

## 2022-10-29 DIAGNOSIS — Z2821 Immunization not carried out because of patient refusal: Secondary | ICD-10-CM

## 2022-10-29 DIAGNOSIS — I10 Essential (primary) hypertension: Secondary | ICD-10-CM

## 2022-10-29 DIAGNOSIS — E782 Mixed hyperlipidemia: Secondary | ICD-10-CM | POA: Diagnosis not present

## 2022-10-29 DIAGNOSIS — N401 Enlarged prostate with lower urinary tract symptoms: Secondary | ICD-10-CM

## 2022-10-29 MED ORDER — METHYLPREDNISOLONE 4 MG PO TBPK: ORAL_TABLET | ORAL | 0 refills | Status: DC

## 2022-10-29 MED ORDER — TAMSULOSIN HCL 0.4 MG PO CAPS: 0.8000 mg | ORAL_CAPSULE | Freq: Every day | ORAL | 3 refills | Status: DC

## 2022-10-29 MED ORDER — AMOXICILLIN-POT CLAVULANATE 875-125 MG PO TABS: 1.0000 | ORAL_TABLET | Freq: Two times a day (BID) | ORAL | 0 refills | Status: DC

## 2022-10-29 NOTE — Patient Instructions (Addendum)
Please start taking Augmentin and Prednisone as prescribed.  Please try taking Tamsulosin 2 capsules at nighttime to see if it helps with nocturia.  Please continue to use Duoneb as needed for shortness of breath or wheezing.  Please use Mucinex as needed for cough.

## 2022-10-29 NOTE — Assessment & Plan Note (Signed)
Lab Results  Component Value Date   HGBA1C WILL FOLLOW 10/29/2022   Was on Glimepiride 1 mg QD, now diet controlled Would prefer to start Metformin or SGLT2 inhibitor if HbA1C increases now On statin Needs to see Ophthalmologist for diabetic eye exam

## 2022-10-30 NOTE — Assessment & Plan Note (Signed)
Likely has COPD exacerbation Started empiric Augmentin and Medrol dosepak Currently has only albuterol neb, uses it frequently Added DuoNeb for maintenance neb, but did not get it Does not like to use inhaler

## 2022-10-30 NOTE — Assessment & Plan Note (Signed)
On Tamsulosin 0.4 mg QD Still has nocturia Increased dose of Tamsulosin 0.8 mg QD

## 2022-10-30 NOTE — Assessment & Plan Note (Signed)
On statin for HLD BP needs to be better controlled

## 2022-10-30 NOTE — Assessment & Plan Note (Signed)
BP Readings from Last 1 Encounters:  10/29/22 138/84   Remains WNL at home Well controlled, on Metoprolol and Diltiazem Plan to add ARB if persistently elevated, had cough with ACEi Counseled for compliance with the medications Advised DASH diet and moderate exercise/walking, at least 150 mins/week

## 2022-10-30 NOTE — Assessment & Plan Note (Signed)
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  Had offered to refer to Vascular surgeon Wants to discuss with his Cardiologist - Dr Percival Spanish

## 2022-10-30 NOTE — Assessment & Plan Note (Signed)
Lipid profile reviewed Had increased Pravastatin dose to 80 mg QD On fenofibrate Would prefer to add Vascepa, but cost is a limiting factor currently

## 2022-10-30 NOTE — Progress Notes (Signed)
Established Patient Office Visit  Subjective:  Patient ID: Randall Sullivan, male    DOB: [birth year]  Age: 72 y.o. MRN: [id number]  CC:  Chief Complaint  Patient presents with   COPD    Six month follow up for COPD and hypertension. Patient having a lot of nasal congestion, shortness of breath and wheezing    HPI Randall Sullivan is a 72 y.o. male with past medical history of HTN, PAF, CVA, lung cancer s/p lobectomy, HLD and insomnia who presents for f/u of his chronic medical conditions.  He takes Coumadin for PAF and takes statin for HLD.  Denies any chest pain, dyspnea or palpitations currently.  Denies any recent change in sensation over UE or LE.  He complains of chronic intermittent dizziness, which improves with  meclizine.  He reports recent worsening of cough and dyspnea.  He has sore throat and chest congestion for the last 4 weeks.  He denies any fever or chills.  He uses albuterol nebulizer for dyspnea or wheezing and uses it about 2-3 times in a day.  Denies any recent fever, chills or hemoptysis.  BPH: He reports nocturia, about 3-4 episodes at nighttime despite taking Flomax.  Denies any dysuria or hematuria currently.  Denies weak urinary stream currently.   Past Medical History:  Diagnosis Date   A-fib [facility]) 2017   after last lung surgery patient had a history if Afib documented in hospital    Acute appendicitis 07/14/2021   Arthritis    back & legs ( knees)   Cancer (HCC)    lung   Complication of anesthesia    agitated upon waking from anesth.    Emphysema    Hyperlipidemia    Hypertension    Oxygen dependent    2L continuous   Stroke ([place])    Tobacco abuse     Past Surgical History:  Procedure Laterality Date   HERNIA REPAIR Bilateral 5784   umbilical   LAPAROSCOPIC APPENDECTOMY N/A 07/15/2021   Procedure: APPENDECTOMY LAPAROSCOPIC;  Surgeon: Virl Cagey, MD;  Location: [facility];  Service: General;  Laterality: N/A;   VIDEO ASSISTED THORACOSCOPY Right  12/21/2018   Procedure: VIDEO ASSISTED THORACOSCOPY AND RESECTION OF BLEBS RIGHT LOWER LOBE;  Surgeon: [name], MD;  Location: [place];  Service: Thoracic;  Laterality: Right;   VIDEO ASSISTED THORACOSCOPY (VATS)/ LOBECTOMY Left 08/01/2016   Procedure: VIDEO ASSISTED THORACOSCOPY (VATS)/ LEFT UPPER LOBECTOMY with lymph node sampling and onQ placement;  Surgeon: [name], MD;  Location: [place];  Service: Thoracic;  Laterality: Left;   VIDEO ASSISTED THORACOSCOPY (VATS)/WEDGE RESECTION Right 12/09/2018   Procedure: VIDEO ASSISTED THORACOSCOPY (VATS)/WEDGE RESECTION;  Surgeon: [name], MD;  Location: [place];  Service: Thoracic;  Laterality: Right;   VIDEO BRONCHOSCOPY Bilateral 12/21/2018   Procedure: VIDEO BRONCHOSCOPY;  Surgeon: [name], MD;  Location: [place];  Service: Thoracic;  Laterality: Bilateral;   VIDEO BRONCHOSCOPY WITH ENDOBRONCHIAL NAVIGATION N/A 07/03/2016   Procedure: VIDEO BRONCHOSCOPY WITH ENDOBRONCHIAL NAVIGATION;  Surgeon: [name], MD;  Location: [place];  Service: Thoracic;  Laterality: N/A;   VIDEO BRONCHOSCOPY WITH INSERTION OF INTERBRONCHIAL VALVE (IBV) N/A 12/11/2018   Procedure: VIDEO BRONCHOSCOPY WITH INSERTION OF INTERBRONCHIAL VALVE (IBV);  Surgeon: [name], MD;  Location: [facility] OR;  Service: Thoracic;  Laterality: N/A;   VIDEO BRONCHOSCOPY WITH INSERTION OF INTERBRONCHIAL VALVE (IBV) N/A 02/10/2019   Procedure: VIDEO BRONCHOSCOPY WITH REMOVAL OF INTERBRONCHIAL VALVE (IBV);  Surgeon: Modesto Charon  C, MD;  Location: [place] OR;  Service: Thoracic;  Laterality: N/A;    Family History  Problem Relation Age of Onset   Cancer Mother    Cancer Sister    Aneurysm Sister    Kidney disease Paternal Aunt    Asthma Brother    Cancer - Lung Brother    Cancer Brother     Social History   Socioeconomic History   Marital status: Married    Spouse name: Not on file   Number of children: 2   Years of  education: Not on file   Highest education level: Not on file  Occupational History   Occupation: Vinyl siding   Occupation: Retired  Tobacco Use   Smoking status: Former    Packs/day: 1.00    Years: 40.00    Total pack years: 40.00    Types: Cigarettes    Quit date: 05/28/2016    Years since quitting: 6.4   Smokeless tobacco: Never  Vaping Use   Vaping Use: Never used  Substance and Sexual Activity   Alcohol use: Not Currently    Comment: quit- 2016   Drug use: No   Sexual activity: Not Currently  Other Topics Concern   Not on file  Social History Narrative   Married for 78 years,second.Lives with wife. 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   Social Determinants of Health   Financial Resource Strain: Not on file  Food Insecurity: Not on file  Transportation Needs: No Transportation Needs (07/31/2021)   PRAPARE - Hydrologist (Medical): No    Lack of Transportation (Non-Medical): No  Physical Activity: Insufficiently Active (07/31/2021)   Exercise Vital Sign    Days of Exercise per Week: 7 days    Minutes of Exercise per Session: 20 min  Stress: Not on file  Social Connections: Moderately Isolated (07/31/2021)   Social Connection and Isolation Panel [NHANES]    Frequency of Communication with Friends and Family: More than three times a week    Frequency of Social Gatherings with Friends and Family: Once a week    Attends Religious Services: Never    Marine scientist or Organizations: No    Attends Archivist Meetings: Never    Marital Status: Married  Human resources officer Violence: Not At Risk (07/31/2021)   Humiliation, Afraid, Rape, and Kick questionnaire    Fear of Current or Ex-Partner: No    Emotionally Abused: No    Physically Abused: No    Sexually Abused: No    Outpatient Medications Prior to Visit  Medication Sig Dispense Refill   acetaminophen (TYLENOL) 325 MG tablet Take 2 tablets (650 mg total) by mouth every 6  (six) hours as needed for mild pain (or Fever >/= 101).     albuterol (PROVENTIL) (2.5 MG/3ML) 0.083% nebulizer solution USE 1 VIAL IN NEBULIZER EVERY 6 HOURS AS NEEDED FOR WHEEZING AND FOR SHORTNESS OF BREATH 150 mL 0   albuterol (PROVENTIL) (2.5 MG/3ML) 0.083% nebulizer solution USE 1 VIAL IN NEBULIZER EVERY 6 HOURS AS NEEDED FOR WHEEZING OR SHORTNESS OF BREATH 180 mL 2   atorvastatin (LIPITOR) 40 MG tablet Take 1 tablet (40 mg total) by mouth daily. 90 tablet 3   Cholecalciferol (VITAMIN D3) 125 MCG (5000 UT) CAPS Take 1 capsule (5,000 Units total) by mouth daily. 30 capsule 0   diltiazem (CARDIZEM CD) 180 MG 24 hr capsule Take 1 capsule (180 mg total) by mouth daily. 90 capsule 3   fenofibrate (TRICOR) 145  MG tablet Take 1 tablet (145 mg total) by mouth daily. 90 tablet 1   ipratropium-albuterol (DUONEB) 0.5-2.5 (3) MG/3ML SOLN Take 3 mLs by nebulization every 6 (six) hours as needed. 360 mL 3   loratadine (CLARITIN) 10 MG tablet Take 1 tablet (10 mg total) by mouth daily. 90 tablet 1   meclizine (ANTIVERT) 25 MG tablet Take 1 tablet (25 mg total) by mouth 3 (three) times daily as needed for dizziness. 30 tablet 0   metoprolol succinate (TOPROL-XL) 50 MG 24 hr tablet TAKE 1 TABLET BY MOUTH ONCE DAILY WITH OR IMMEDIATELY FOLLOWING A MEAL 90 tablet 1   OXYGEN Inhale 2-3 L into the lungs continuous. Hx of lung ca, copd, and emphysema     polyethylene glycol (MIRALAX / GLYCOLAX) 17 g packet Take 17 g by mouth daily as needed for mild constipation. 14 each 0   traZODone (DESYREL) 100 MG tablet Take 1 tablet (100 mg total) by mouth at bedtime. 90 tablet 1   warfarin (COUMADIN) 5 MG tablet Take 1 tablet (5 mg total) by mouth daily. 90 tablet 1   tamsulosin (FLOMAX) 0.4 MG CAPS capsule Take 1 capsule by mouth once daily 90 capsule 3   No facility-administered medications prior to visit.    Allergies  Allergen Reactions   No Known Allergies     ROS Review of Systems  Constitutional:  Positive  for fatigue. Negative for chills and fever.  HENT:  Positive for sore throat. Negative for congestion, sinus pressure and sinus pain.   Eyes:  Positive for visual disturbance. Negative for redness.  Respiratory:  Positive for cough and shortness of breath.   Cardiovascular:  Negative for chest pain and palpitations.  Gastrointestinal:  Negative for diarrhea, nausea and vomiting.  Genitourinary:  Positive for frequency. Negative for dysuria and hematuria.  Musculoskeletal:  Negative for neck pain and neck stiffness.  Skin:  Negative for rash.  Neurological:  Positive for dizziness. Negative for tremors, seizures, syncope and headaches.  Psychiatric/Behavioral:  Positive for sleep disturbance. Negative for agitation and behavioral problems.       Objective:    Physical Exam Vitals reviewed.  Constitutional:      General: He is not in acute distress.    Appearance: He is not diaphoretic.  HENT:     Head: Normocephalic and atraumatic.     Nose: Nose normal.     Mouth/Throat:     Mouth: Mucous membranes are moist.  Eyes:     General: No scleral icterus.    Extraocular Movements: Extraocular movements intact.  Cardiovascular:     Rate and Rhythm: Normal rate and regular rhythm.     Pulses: Normal pulses.     Heart sounds: Normal heart sounds. No murmur heard. Pulmonary:     Breath sounds: Wheezing (Mild, b/l) present. No rales.  Abdominal:     Palpations: Abdomen is soft.     Tenderness: There is no abdominal tenderness.  Musculoskeletal:     Cervical back: Neck supple. No tenderness.     Right lower leg: No edema.     Left lower leg: No edema.  Skin:    General: Skin is warm.     Findings: No rash.  Neurological:     General: No focal deficit present.     Mental Status: He is alert and oriented to person, place, and time. Mental status is at baseline.     Sensory: No sensory deficit.  Psychiatric:  Mood and Affect: Mood normal.        Behavior: Behavior normal.      BP 138/84 (BP Location: Left Arm, Cuff Size: Normal)   Pulse 68   Ht 5\' 10"  (1.778 m)   Wt 226 lb 9.6 oz (102.8 kg)   SpO2 91%   BMI 32.51 kg/m  Wt Readings from Last 3 Encounters:  10/29/22 226 lb 9.6 oz (102.8 kg)  09/04/22 224 lb 9.6 oz (101.9 kg)  04/09/22 223 lb 9.6 oz (101.4 kg)    Lab Results  Component Value Date   TSH 1.590 07/05/2021   Lab Results  Component Value Date   WBC 6.7 06/11/2022   HGB 15.4 06/11/2022   HCT 47.9 06/11/2022   MCV 84.5 06/11/2022   PLT 200 06/11/2022   Lab Results  Component Value Date   NA 142 10/29/2022   K 4.7 10/29/2022   CHLORIDE 99 04/09/2017   CO2 26 10/29/2022   GLUCOSE 82 10/29/2022   BUN 11 10/29/2022   CREATININE 1.17 10/29/2022   BILITOT 0.4 10/29/2022   ALKPHOS 45 10/29/2022   AST 21 10/29/2022   ALT 31 10/29/2022   PROT 7.2 10/29/2022   ALBUMIN 4.8 10/29/2022   CALCIUM 9.6 10/29/2022   ANIONGAP 3 (L) 06/11/2022   EGFR 66 10/29/2022   Lab Results  Component Value Date   CHOL 170 10/29/2022   Lab Results  Component Value Date   HDL 32 (L) 10/29/2022   Lab Results  Component Value Date   LDLCALC 76 10/29/2022   Lab Results  Component Value Date   TRIG 391 (H) 10/29/2022   Lab Results  Component Value Date   CHOLHDL 5.3 (H) 10/29/2022   Lab Results  Component Value Date   HGBA1C WILL FOLLOW 10/29/2022      Assessment & Plan:   Problem List Items Addressed This Visit    Diabetes mellitus ([place]) Lab Results  Component Value Date   HGBA1C WILL FOLLOW 10/29/2022   Was on Glimepiride 1 mg QD, now diet controlled Would prefer to start Metformin or SGLT2 inhibitor if HbA1C increases now On statin Needs to see Ophthalmologist for diabetic eye exam  Essential (primary) hypertension BP Readings from Last 1 Encounters:  10/29/22 138/84   Remains WNL at home Well controlled, on Metoprolol and Diltiazem Plan to add ARB if persistently elevated, had cough with ACEi Counseled for compliance  with the medications Advised DASH diet and moderate exercise/walking, at least 150 mins/week  PAF (paroxysmal atrial fibrillation) (HCC) On metoprolol and diltiazem for rate control On Coumadin for [facility] Followed by cardiology and [facility] clinic  Aortic atherosclerosis ([place]) On statin for HLD BP needs to be better controlled  Infrarenal abdominal aortic aneurysm (AAA) without rupture Infrarenal abdominal aortic aneurysm measuring up to 4.0 x 3.8 cm with a large burden of eccentric mural thrombus.  Had offered to refer to Vascular surgeon Wants to discuss with his Cardiologist - Dr Percival Spanish  Emphysema of lung [facility]) Likely has COPD exacerbation Started empiric Augmentin and Medrol dosepak Currently has only albuterol neb, uses it frequently Added DuoNeb for maintenance neb, but did not get it Does not like to use inhaler  Hyperlipidemia Lipid profile reviewed Had increased Pravastatin dose to 80 mg QD On fenofibrate Would prefer to add Vascepa, but cost is a limiting factor currently  Benign prostatic hyperplasia with nocturia On Tamsulosin 0.4 mg QD Still has nocturia Increased dose of Tamsulosin 0.8 mg QD  Refused influenza vaccine  Meds ordered this encounter  Medications   methylPREDNISolone (MEDROL DOSEPAK) 4 MG TBPK tablet    Sig: Take as package instructions.    Dispense:  1 each    Refill:  0   amoxicillin-clavulanate (AUGMENTIN) 875-125 MG tablet    Sig: Take 1 tablet by mouth 2 (two) times daily.    Dispense:  14 tablet    Refill:  0   tamsulosin (FLOMAX) 0.4 MG CAPS capsule    Sig: Take 2 capsules (0.8 mg total) by mouth daily.    Dispense:  180 capsule    Refill:  3    Follow-up: Return in about 4 months (around 02/27/2023) for COPD and BPH.    Lindell Spar, MD

## 2022-10-30 NOTE — Assessment & Plan Note (Signed)
On metoprolol and diltiazem for rate control On Coumadin for [facility] Followed by cardiology and [facility] clinic

## 2022-10-31 LAB — CMP14+EGFR
ALT: 31 IU/L (ref 0–44)
AST: 21 IU/L (ref 0–40)
Albumin/Globulin Ratio: 2 (ref 1.2–2.2)
Albumin: 4.8 g/dL (ref 3.8–4.8)
Alkaline Phosphatase: 45 IU/L (ref 44–121)
BUN/Creatinine Ratio: 9 — ABNORMAL LOW (ref 10–24)
BUN: 11 mg/dL (ref 8–27)
Bilirubin Total: 0.4 mg/dL (ref 0.0–1.2)
CO2: 26 mmol/L (ref 20–29)
Calcium: 9.6 mg/dL (ref 8.6–10.2)
Chloride: 101 mmol/L (ref 96–106)
Creatinine, Ser: 1.17 mg/dL (ref 0.76–1.27)
Globulin, Total: 2.4 g/dL (ref 1.5–4.5)
Glucose: 82 mg/dL (ref 70–99)
Potassium: 4.7 mmol/L (ref 3.5–5.2)
Sodium: 142 mmol/L (ref 134–144)
Total Protein: 7.2 g/dL (ref 6.0–8.5)
eGFR: 66 mL/min/{1.73_m2} (ref >59)

## 2022-10-31 LAB — HEMOGLOBIN A1C
Est. average glucose Bld gHb Est-mCnc: 134 mg/dL
Hgb A1c MFr Bld: 6.3 % — ABNORMAL HIGH (ref 4.8–5.6)

## 2022-10-31 LAB — LIPID PANEL
Chol/HDL Ratio: 5.3 ratio — ABNORMAL HIGH (ref 0.0–5.0)
Cholesterol, Total: 170 mg/dL (ref 100–199)
HDL: 32 mg/dL — ABNORMAL LOW (ref >39)
LDL Chol Calc (NIH): 76 mg/dL (ref 0–99)
Triglycerides: 391 mg/dL — ABNORMAL HIGH (ref 0–149)
VLDL Cholesterol Cal: 62 mg/dL — ABNORMAL HIGH (ref 5–40)

## 2022-11-19 ENCOUNTER — Encounter: Payer: Medicare Other | Admitting: Internal Medicine

## 2022-11-23 ENCOUNTER — Encounter: Payer: Self-pay | Admitting: Internal Medicine

## 2022-11-24 ENCOUNTER — Encounter: Payer: Self-pay | Admitting: Internal Medicine

## 2022-11-25 ENCOUNTER — Other Ambulatory Visit: Payer: Self-pay | Admitting: Internal Medicine

## 2022-11-25 ENCOUNTER — Other Ambulatory Visit: Payer: Self-pay

## 2022-11-25 ENCOUNTER — Encounter: Payer: Self-pay | Admitting: Internal Medicine

## 2022-11-25 DIAGNOSIS — G4733 Obstructive sleep apnea (adult) (pediatric): Secondary | ICD-10-CM | POA: Insufficient documentation

## 2022-11-25 DIAGNOSIS — C3492 Malignant neoplasm of unspecified part of left bronchus or lung: Secondary | ICD-10-CM

## 2022-11-25 MED ORDER — ALBUTEROL SULFATE (2.5 MG/3ML) 0.083% IN NEBU: INHALATION_SOLUTION | RESPIRATORY_TRACT | 0 refills | Status: DC

## 2022-11-25 NOTE — Addendum Note (Signed)
Addended byIhor Dow on: 11/25/2022 06:20 PM   Modules accepted: Orders

## 2022-12-01 ENCOUNTER — Encounter: Payer: Self-pay | Admitting: Internal Medicine

## 2022-12-03 ENCOUNTER — Ambulatory Visit: Payer: Medicare Other | Attending: Cardiology | Admitting: *Deleted

## 2022-12-03 DIAGNOSIS — Z5181 Encounter for therapeutic drug level monitoring: Secondary | ICD-10-CM

## 2022-12-03 DIAGNOSIS — I480 Paroxysmal atrial fibrillation: Secondary | ICD-10-CM | POA: Diagnosis not present

## 2022-12-03 DIAGNOSIS — I639 Cerebral infarction, unspecified: Secondary | ICD-10-CM | POA: Insufficient documentation

## 2022-12-03 LAB — POCT INR: INR: 2.4 (ref 2.0–3.0)

## 2022-12-03 NOTE — Patient Instructions (Signed)
Continue warfarin 1 tablet daily except 1/2 tablet on Tuesdays and Fridays Recheck INR in 6 weeks.

## 2022-12-06 ENCOUNTER — Other Ambulatory Visit: Payer: Self-pay

## 2022-12-06 ENCOUNTER — Encounter: Payer: Self-pay | Admitting: Internal Medicine

## 2022-12-06 DIAGNOSIS — C3492 Malignant neoplasm of unspecified part of left bronchus or lung: Secondary | ICD-10-CM

## 2022-12-06 MED ORDER — ALBUTEROL SULFATE (2.5 MG/3ML) 0.083% IN NEBU: INHALATION_SOLUTION | RESPIRATORY_TRACT | 0 refills | Status: DC

## 2022-12-08 ENCOUNTER — Encounter: Payer: Self-pay | Admitting: Internal Medicine

## 2022-12-09 ENCOUNTER — Other Ambulatory Visit: Payer: Self-pay

## 2022-12-09 DIAGNOSIS — I480 Paroxysmal atrial fibrillation: Secondary | ICD-10-CM

## 2022-12-09 DIAGNOSIS — Z8673 Personal history of transient ischemic attack (TIA), and cerebral infarction without residual deficits: Secondary | ICD-10-CM

## 2022-12-09 MED ORDER — WARFARIN SODIUM 5 MG PO TABS: 5.0000 mg | ORAL_TABLET | Freq: Every day | ORAL | 1 refills | Status: DC

## 2022-12-18 ENCOUNTER — Other Ambulatory Visit: Payer: Self-pay

## 2022-12-18 ENCOUNTER — Encounter: Payer: Self-pay | Admitting: Internal Medicine

## 2022-12-18 DIAGNOSIS — C3492 Malignant neoplasm of unspecified part of left bronchus or lung: Secondary | ICD-10-CM

## 2022-12-18 MED ORDER — ALBUTEROL SULFATE (2.5 MG/3ML) 0.083% IN NEBU: INHALATION_SOLUTION | RESPIRATORY_TRACT | 0 refills | Status: DC

## 2022-12-19 ENCOUNTER — Encounter: Payer: Medicare Other | Admitting: Internal Medicine

## 2022-12-20 ENCOUNTER — Other Ambulatory Visit: Payer: Self-pay | Admitting: Internal Medicine

## 2022-12-20 ENCOUNTER — Encounter: Payer: Self-pay | Admitting: Internal Medicine

## 2022-12-24 ENCOUNTER — Encounter: Payer: Self-pay | Admitting: Internal Medicine

## 2022-12-24 ENCOUNTER — Ambulatory Visit (INDEPENDENT_AMBULATORY_CARE_PROVIDER_SITE_OTHER): Payer: Medicare Other | Admitting: Internal Medicine

## 2022-12-24 DIAGNOSIS — Z0000 Encounter for general adult medical examination without abnormal findings: Secondary | ICD-10-CM

## 2022-12-24 DIAGNOSIS — H538 Other visual disturbances: Secondary | ICD-10-CM

## 2022-12-24 DIAGNOSIS — Z1211 Encounter for screening for malignant neoplasm of colon: Secondary | ICD-10-CM

## 2022-12-24 NOTE — Patient Instructions (Addendum)
  [name] , Thank you for taking time to come for your Medicare Wellness Visit. I appreciate your ongoing commitment to your health goals. Please review the following plan we discussed and let me know if I can assist you in the future.   These are the goals we discussed: No specific goals at this time.   This is a list of the screening recommended for you and due dates:  Health Maintenance  Topic Date Due   Zoster (Shingles) Vaccine (1 of 2) Never done   Colon Cancer Screening  Never done   COVID-19 Vaccine (3 - Moderna risk series) 02/10/2020   Medicare Annual Wellness Visit  07/31/2022   Flu Shot  12/29/2022*   Eye exam for diabetics  12/26/2022   Yearly kidney health urinalysis for diabetes  04/10/2023   Complete foot exam   04/10/2023   Hemoglobin A1C  04/29/2023   Yearly kidney function blood test for diabetes  10/30/2023   DTaP/Tdap/Td vaccine (2 - Td or Tdap) 03/07/2028   Pneumonia Vaccine  Completed   Hepatitis C Screening: USPSTF Recommendation to screen - Ages 6-79 yo.  Completed   HPV Vaccine  Aged Out  *Topic was postponed. The date shown is not the original due date.

## 2022-12-24 NOTE — Progress Notes (Signed)
Subjective:  This is a telephone encounter between Randall Sullivan and Randall Sullivan on 12/24/2022 for awv. The visit was conducted with the patient located at home and Randall Sullivan at [facility]. The patient's identity was confirmed using their DOB and current address. The patient has consented to being evaluated through a telephone encounter and understands the associated risks (an examination cannot be done and the patient may need to come in for an appointment) / benefits (allows the patient to remain at home, decreasing exposure to coronavirus).      Randall Sullivan is a 72 y.o. male who presents for Medicare Annual/Subsequent preventive examination.  Review of Systems    ROS        Objective:    There were no vitals filed for this visit. There is no height or weight on file to calculate BMI.     12/24/2022    4:19 PM 06/18/2022    8:24 AM 03/29/2022    5:26 AM 12/22/2021    3:27 PM 12/22/2021    3:24 PM 07/31/2021    8:33 AM 07/14/2021    8:40 PM  Advanced Directives  Does Patient Have a Medical Advance Directive? Yes No No No No No Yes  Type of Advance Directive       Living will;Healthcare Power of Attorney  Does patient want to make changes to medical advance directive? Yes (ED - Information included in AVS)     No - Patient declined No - Guardian declined  Copy of Jet in Chart?  No - copy requested    No - copy requested Yes - validated most recent copy scanned in chart (See row information)  Would patient like information on creating a medical advance directive?  No - Patient declined No - Patient declined No - Patient declined No - Patient declined No - Patient declined     Current Medications (verified) Outpatient Encounter Medications as of 12/24/2022  Medication Sig   acetaminophen (TYLENOL) 325 MG tablet Take 2 tablets (650 mg total) by mouth every 6 (six) hours as needed for mild pain (or Fever >/= 101).   albuterol (PROVENTIL) (2.5 MG/3ML) 0.083% nebulizer  solution USE 1 VIAL IN NEBULIZER EVERY 6 HOURS AS NEEDED FOR WHEEZING OR SHORTNESS OF BREATH   albuterol (PROVENTIL) (2.5 MG/3ML) 0.083% nebulizer solution USE 1 VIAL IN NEBULIZER EVERY 6 HOURS AS NEEDED FOR WHEEZING AND FOR SHORTNESS OF BREATH   amoxicillin-clavulanate (AUGMENTIN) 875-125 MG tablet Take 1 tablet by mouth 2 (two) times daily.   atorvastatin (LIPITOR) 40 MG tablet Take 1 tablet (40 mg total) by mouth daily.   Cholecalciferol (VITAMIN D3) 125 MCG (5000 UT) CAPS Take 1 capsule (5,000 Units total) by mouth daily.   diltiazem (CARDIZEM CD) 180 MG 24 hr capsule Take 1 capsule (180 mg total) by mouth daily.   fenofibrate (TRICOR) 145 MG tablet Take 1 tablet (145 mg total) by mouth daily.   ipratropium-albuterol (DUONEB) 0.5-2.5 (3) MG/3ML SOLN Take 3 mLs by nebulization every 6 (six) hours as needed.   loratadine (CLARITIN) 10 MG tablet Take 1 tablet (10 mg total) by mouth daily.   meclizine (ANTIVERT) 25 MG tablet Take 1 tablet (25 mg total) by mouth 3 (three) times daily as needed for dizziness.   methylPREDNISolone (MEDROL DOSEPAK) 4 MG TBPK tablet Take as package instructions.   metoprolol succinate (TOPROL-XL) 50 MG 24 hr tablet TAKE 1 TABLET BY MOUTH ONCE DAILY WITH MEALS   OXYGEN Inhale 2-3 L into  the lungs continuous. Hx of lung ca, copd, and emphysema   polyethylene glycol (MIRALAX / GLYCOLAX) 17 g packet Take 17 g by mouth daily as needed for mild constipation.   tamsulosin (FLOMAX) 0.4 MG CAPS capsule Take 2 capsules (0.8 mg total) by mouth daily.   traZODone (DESYREL) 100 MG tablet Take 1 tablet (100 mg total) by mouth at bedtime.   warfarin (COUMADIN) 5 MG tablet Take 1 tablet (5 mg total) by mouth daily.   No facility-administered encounter medications on file as of 12/24/2022.    Allergies (verified) No known allergies   History: Past Medical History:  Diagnosis Date   A-fib ([place]) 2017   after last lung surgery patient had a history if Afib documented in hospital     Acute appendicitis 07/14/2021   Arthritis    back & legs ( knees)   Cancer (HCC)    lung   Complication of anesthesia    agitated upon waking from anesth.    Emphysema    Emphysema of lung (HCC)    Hyperlipidemia    Hypertension    OSA (obstructive sleep apnea) 11/25/2022   Oxygen dependent    2L continuous   Stroke [facility])    Tobacco abuse    Past Surgical History:  Procedure Laterality Date   APPENDECTOMY  07/15/2021   HERNIA REPAIR Bilateral [id number]   umbilical   LAPAROSCOPIC APPENDECTOMY N/A 07/15/2021   Procedure: APPENDECTOMY LAPAROSCOPIC;  Surgeon: Virl Cagey, MD;  Location: [facility];  Service: General;  Laterality: N/A;   VIDEO ASSISTED THORACOSCOPY Right 12/21/2018   Procedure: VIDEO ASSISTED THORACOSCOPY AND RESECTION OF BLEBS RIGHT LOWER LOBE;  Surgeon: [name], MD;  Location: [place];  Service: Thoracic;  Laterality: Right;   VIDEO ASSISTED THORACOSCOPY (VATS)/ LOBECTOMY Left 08/01/2016   Procedure: VIDEO ASSISTED THORACOSCOPY (VATS)/ LEFT UPPER LOBECTOMY with lymph node sampling and onQ placement;  Surgeon: [name], MD;  Location: [place];  Service: Thoracic;  Laterality: Left;   VIDEO ASSISTED THORACOSCOPY (VATS)/WEDGE RESECTION Right 12/09/2018   Procedure: VIDEO ASSISTED THORACOSCOPY (VATS)/WEDGE RESECTION;  Surgeon: [name], MD;  Location: [place];  Service: Thoracic;  Laterality: Right;   VIDEO BRONCHOSCOPY Bilateral 12/21/2018   Procedure: VIDEO BRONCHOSCOPY;  Surgeon: [name], MD;  Location: [place];  Service: Thoracic;  Laterality: Bilateral;   VIDEO BRONCHOSCOPY WITH ENDOBRONCHIAL NAVIGATION N/A 07/03/2016   Procedure: VIDEO BRONCHOSCOPY WITH ENDOBRONCHIAL NAVIGATION;  Surgeon: [name], MD;  Location: [place];  Service: Thoracic;  Laterality: N/A;   VIDEO BRONCHOSCOPY WITH INSERTION OF INTERBRONCHIAL VALVE (IBV) N/A 12/11/2018   Procedure: VIDEO BRONCHOSCOPY WITH INSERTION OF INTERBRONCHIAL VALVE  (IBV);  Surgeon: [name], MD;  Location: [facility];  Service: Thoracic;  Laterality: N/A;   VIDEO BRONCHOSCOPY WITH INSERTION OF INTERBRONCHIAL VALVE (IBV) N/A 02/10/2019   Procedure: VIDEO BRONCHOSCOPY WITH REMOVAL OF INTERBRONCHIAL VALVE (IBV);  Surgeon: [name], MD;  Location: [facility];  Service: Thoracic;  Laterality: N/A;   Family History  Problem Relation Age of Onset   Cancer Mother    Cancer Sister    Aneurysm Sister    Kidney disease Paternal Aunt    Asthma Brother    Cancer - Lung Brother    Cancer Brother    Arthritis Brother    Social History   Socioeconomic History   Marital status: Married    Spouse name: Not on file   Number of children: 2   Years of education:  Not on file   Highest education level: Not on file  Occupational History   Occupation: Vinyl siding   Occupation: Retired  Tobacco Use   Smoking status: Former    Packs/day: 1.00    Years: 40.00    Additional pack years: 0.00    Total pack years: 40.00    Types: Cigarettes    Quit date: 05/28/2016    Years since quitting: 6.5   Smokeless tobacco: Never  Vaping Use   Vaping Use: Never used  Substance and Sexual Activity   Alcohol use: Not Currently    Comment: quit- 2016   Drug use: No   Sexual activity: Not Currently  Other Topics Concern   Not on file  Social History Narrative   Married for 12 years,second.Lives with wife. 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   Social Determinants of Health   Financial Resource Strain: Low Risk  (12/23/2022)   Overall Financial Resource Strain (CARDIA)    Difficulty of Paying Living Expenses: Not hard at all  Food Insecurity: Patient Declined (12/23/2022)   Hunger Vital Sign    Worried About Running Out of Food in the Last Year: Patient declined    [place] in the Last Year: Patient declined  Transportation Needs: No Transportation Needs (12/23/2022)   PRAPARE - Hydrologist (Medical): No    Lack of  Transportation (Non-Medical): No  Physical Activity: Unknown (12/23/2022)   Exercise Vital Sign    Days of Exercise per Week: 0 days    Minutes of Exercise per Session: Patient declined  Stress: No Stress Concern Present (12/23/2022)   [place]    Feeling of Stress : Only a little  Social Connections: Unknown (12/23/2022)   Social Connection and Isolation Panel [NHANES]    Frequency of Communication with Friends and Family: Twice a week    Frequency of Social Gatherings with Friends and Family: Patient declined    Attends Religious Services: Not on Advertising copywriter or Organizations: No    Attends Archivist Meetings: Never    Marital Status: Married    Tobacco Counseling Counseling given: Not Answered   Clinical Intake:  Pre-visit preparation completed: Yes  Pain : No/denies pain     Diabetes: Yes (diet controlled) CBG done?: No Did pt. bring in CBG monitor from home?: No  How often do you need to have someone help you when you read instructions, pamphlets, or other written materials from your doctor or pharmacy?: 3 - Sometimes What is the last grade level you completed in school?: 9th grade    Activities of Daily Living    12/24/2022    4:21 PM 12/23/2022    5:17 PM  In your present state of health, do you have any difficulty performing the following activities:  Hearing? 1 1  Vision? 1 1  Difficulty concentrating or making decisions? 1 1  Walking or climbing stairs? 0 0  Dressing or bathing? 0 0  Doing errands, shopping? 1 1  Preparing Food and eating ?  N  Using the Toilet?  N  In the past six months, have you accidently leaked urine?  N  Do you have problems with loss of bowel control?  N  Managing your Medications?  N  Managing your Finances?  N  Housekeeping or managing your Housekeeping?  N    Patient Care Team: Lindell Spar, MD as PCP -  General (Internal  Medicine)  Indicate any recent Medical Services you may have received from other than [facility] providers in the past year (date may be approximate).     Assessment:   This is a routine wellness examination for [place].  Hearing/Vision screen No results found.  Dietary issues and exercise activities discussed:     Goals Addressed   None    Depression Screen    12/24/2022    4:20 PM 10/29/2022    1:28 PM 04/09/2022    2:32 PM 11/09/2021    8:24 AM 07/31/2021    9:47 AM 07/31/2021    8:34 AM 07/31/2021    8:30 AM  PHQ 2/9 Scores  PHQ - 2 Score 0 0 0 0 0 0 0  PHQ- 9 Score     0      Fall Risk    12/24/2022    4:20 PM 12/23/2022    5:17 PM 10/29/2022    1:28 PM 04/09/2022    2:32 PM 11/09/2021    8:24 AM  Man in the past year? 0 0 0 0 0  Number falls in past yr:   0 0 0  Injury with Fall?   0 0 0  Risk for fall due to :    No Fall Risks No Fall Risks  Follow up    Falls evaluation completed Falls evaluation completed    [place]:  Any stairs in or around the home? Yes  If so, are there any without handrails? Yes  Home free of loose throw rugs in walkways, pet beds, electrical cords, etc? Yes  Adequate lighting in your home to reduce risk of falls? Yes   ASSISTIVE DEVICES UTILIZED TO PREVENT FALLS:  Life alert? No  Use of a cane, walker or w/c? Yes  Grab bars in the bathroom? Yes  Shower chair or bench in shower? Yes  Elevated toilet seat or a handicapped toilet? No      Cognitive Function:    07/31/2021    8:34 AM  MMSE - Mini Mental State Exam  Not completed: Unable to complete        07/31/2021    8:35 AM  6CIT Screen  What Year? 4 points  What month? 0 points  What time? 0 points  Count back from 20 2 points  Months in reverse 4 points  Repeat phrase 10 points  Total Score 20 points    Immunizations Immunization History  Administered Date(s) Administered   Moderna Sars-Covid-2 Vaccination 12/20/2019,  01/13/2020   PNEUMOCOCCAL CONJUGATE-20 04/09/2022   Pneumococcal Polysaccharide-23 03/01/2021   Tdap 03/07/2018    TDAP status: Up to date  Flu Vaccine status: Up to date  Pneumococcal vaccine status: Up to date  Covid-19 vaccine status: Information provided on how to obtain vaccines.   Qualifies for Shingles Vaccine? Yes   Zostavax completed No   Shingrix Completed?: No.    Education has been provided regarding the importance of this vaccine. Patient has been advised to call insurance company to determine out of pocket expense if they have not yet received this vaccine. Advised may also receive vaccine at local pharmacy or Health Dept. Verbalized acceptance and understanding.  Screening Tests Health Maintenance  Topic Date Due   Zoster Vaccines- Shingrix (1 of 2) Never done   COLONOSCOPY (Pts 45-76yrs Insurance coverage will need to be confirmed)  Never done   COVID-19 Vaccine (3 - Moderna risk series)  02/10/2020   Medicare Annual Wellness (AWV)  07/31/2022   INFLUENZA VACCINE  12/29/2022 (Originally 04/30/2022)   OPHTHALMOLOGY EXAM  12/26/2022   Diabetic kidney evaluation - Urine ACR  04/10/2023   FOOT EXAM  04/10/2023   HEMOGLOBIN A1C  04/29/2023   Diabetic kidney evaluation - eGFR measurement  10/30/2023   DTaP/Tdap/Td (2 - Td or Tdap) 03/07/2028   Pneumonia Vaccine 18+ Years old  Completed   Hepatitis C Screening  Completed   HPV VACCINES  Aged Out    Health Maintenance  Health Maintenance Due  Topic Date Due   Zoster Vaccines- Shingrix (1 of 2) Never done   COLONOSCOPY (Pts 45-29yrs Insurance coverage will need to be confirmed)  Never done   COVID-19 Vaccine (3 - Moderna risk series) 02/10/2020   Medicare Annual Wellness (AWV)  07/31/2022    Colorectal cancer screening: Type of screening: Cologuard. Order placed 12/24/2022  Lung Cancer Screening: (Low Dose CT Chest recommended if Age 73-80 years, 30 pack-year currently smoking OR have quit w/in 15years.) does not  qualify. He is being followed for lung nodules at this time. Discussed with patients wife and she is going to follow up with Dr.Mohammad or Dr.Patel to make sure repeat CT chest is done in September 2024.   Additional Screening:  Hepatitis C Screening: does not qualify; Completed 04/09/2022  Vision Screening: Recommended annual ophthalmology exams for early detection of glaucoma and other disorders of the eye. Is the patient up to date with their annual eye exam?  Yes  Who is the provider or what is the name of the office in which the patient attends annual eye exams?  If pt is not established with a provider, would they like to be referred to a provider to establish care? Yes .   Dental Screening: Recommended annual dental exams for proper oral hygiene  Community Resource Referral / Chronic Care Management: CRR required this visit?  No   CCM required this visit?  No      Plan:     I have personally reviewed and noted the following in the patient's chart:   Medical and social history Use of alcohol, tobacco or illicit drugs  Current medications and supplements including opioid prescriptions. Patient is not currently taking opioid prescriptions. Functional ability and status Nutritional status Physical activity Advanced directives List of other physicians Hospitalizations, surgeries, and ER visits in previous 12 months Vitals Screenings to include cognitive, depression, and falls Referrals and appointments  In addition, I have reviewed and discussed with patient certain preventive protocols, quality metrics, and best practice recommendations. A written personalized care plan for preventive services as well as general preventive health recommendations were provided to patient.     Randall Dy, MD   12/24/2022

## 2022-12-27 ENCOUNTER — Encounter: Payer: Self-pay | Admitting: Internal Medicine

## 2022-12-27 ENCOUNTER — Other Ambulatory Visit: Payer: Self-pay

## 2022-12-27 DIAGNOSIS — C3492 Malignant neoplasm of unspecified part of left bronchus or lung: Secondary | ICD-10-CM

## 2022-12-27 MED ORDER — ALBUTEROL SULFATE (2.5 MG/3ML) 0.083% IN NEBU: INHALATION_SOLUTION | RESPIRATORY_TRACT | 2 refills | Status: DC

## 2022-12-29 ENCOUNTER — Encounter: Payer: Self-pay | Admitting: Internal Medicine

## 2022-12-30 ENCOUNTER — Other Ambulatory Visit: Payer: Self-pay

## 2022-12-30 DIAGNOSIS — E785 Hyperlipidemia, unspecified: Secondary | ICD-10-CM

## 2022-12-30 MED ORDER — FENOFIBRATE 145 MG PO TABS
145.0000 mg | ORAL_TABLET | Freq: Every day | ORAL | 1 refills | Status: AC
Start: 2022-12-30 — End: ?

## 2022-12-31 ENCOUNTER — Encounter: Payer: Medicare Other | Admitting: Internal Medicine

## 2023-01-14 ENCOUNTER — Ambulatory Visit: Payer: Medicare Other | Attending: Cardiology | Admitting: *Deleted

## 2023-01-14 DIAGNOSIS — I480 Paroxysmal atrial fibrillation: Secondary | ICD-10-CM | POA: Diagnosis not present

## 2023-01-14 DIAGNOSIS — Z5181 Encounter for therapeutic drug level monitoring: Secondary | ICD-10-CM | POA: Diagnosis not present

## 2023-01-14 DIAGNOSIS — I639 Cerebral infarction, unspecified: Secondary | ICD-10-CM | POA: Diagnosis not present

## 2023-01-14 LAB — POCT INR: INR: 3 (ref 2.0–3.0)

## 2023-01-14 NOTE — Patient Instructions (Signed)
Continue warfarin 1 tablet daily except 1/2 tablet on Tuesdays and Fridays ?Recheck INR in 6 weeks.  ?

## 2023-01-27 ENCOUNTER — Institutional Professional Consult (permissible substitution) (HOSPITAL_BASED_OUTPATIENT_CLINIC_OR_DEPARTMENT_OTHER): Payer: Medicare Other | Admitting: Internal Medicine

## 2023-02-06 ENCOUNTER — Other Ambulatory Visit: Payer: Self-pay

## 2023-02-06 ENCOUNTER — Encounter: Payer: Self-pay | Admitting: Internal Medicine

## 2023-02-06 DIAGNOSIS — C3492 Malignant neoplasm of unspecified part of left bronchus or lung: Secondary | ICD-10-CM

## 2023-02-06 MED ORDER — ALBUTEROL SULFATE (2.5 MG/3ML) 0.083% IN NEBU
INHALATION_SOLUTION | RESPIRATORY_TRACT | 2 refills | Status: DC
Start: 2023-02-06 — End: 2023-04-01

## 2023-02-11 ENCOUNTER — Encounter: Payer: Medicare Other | Admitting: Internal Medicine

## 2023-02-25 ENCOUNTER — Ambulatory Visit: Payer: Medicare Other | Attending: Cardiology | Admitting: *Deleted

## 2023-02-25 DIAGNOSIS — Z5181 Encounter for therapeutic drug level monitoring: Secondary | ICD-10-CM | POA: Insufficient documentation

## 2023-02-25 DIAGNOSIS — I480 Paroxysmal atrial fibrillation: Secondary | ICD-10-CM | POA: Diagnosis not present

## 2023-02-25 DIAGNOSIS — I639 Cerebral infarction, unspecified: Secondary | ICD-10-CM | POA: Insufficient documentation

## 2023-02-25 LAB — POCT INR: INR: 2.2 (ref 2.0–3.0)

## 2023-02-25 NOTE — Patient Instructions (Signed)
Continue warfarin 1 tablet daily except 1/2 tablet on Tuesdays and Fridays ?Recheck INR in 6 weeks.  ?

## 2023-03-04 ENCOUNTER — Encounter: Payer: Self-pay | Admitting: Internal Medicine

## 2023-03-04 ENCOUNTER — Ambulatory Visit (INDEPENDENT_AMBULATORY_CARE_PROVIDER_SITE_OTHER): Payer: Medicare Other | Admitting: Internal Medicine

## 2023-03-04 VITALS — BP 133/74 | HR 69 | Ht 70.0 in | Wt 221.4 lb

## 2023-03-04 DIAGNOSIS — I7143 Infrarenal abdominal aortic aneurysm, without rupture: Secondary | ICD-10-CM

## 2023-03-04 DIAGNOSIS — I10 Essential (primary) hypertension: Secondary | ICD-10-CM

## 2023-03-04 DIAGNOSIS — J9611 Chronic respiratory failure with hypoxia: Secondary | ICD-10-CM | POA: Diagnosis not present

## 2023-03-04 DIAGNOSIS — Z1211 Encounter for screening for malignant neoplasm of colon: Secondary | ICD-10-CM

## 2023-03-04 DIAGNOSIS — E782 Mixed hyperlipidemia: Secondary | ICD-10-CM | POA: Diagnosis not present

## 2023-03-04 DIAGNOSIS — I6930 Unspecified sequelae of cerebral infarction: Secondary | ICD-10-CM

## 2023-03-04 DIAGNOSIS — J439 Emphysema, unspecified: Secondary | ICD-10-CM

## 2023-03-04 DIAGNOSIS — R351 Nocturia: Secondary | ICD-10-CM

## 2023-03-04 DIAGNOSIS — Z7901 Long term (current) use of anticoagulants: Secondary | ICD-10-CM | POA: Diagnosis not present

## 2023-03-04 DIAGNOSIS — E1169 Type 2 diabetes mellitus with other specified complication: Secondary | ICD-10-CM

## 2023-03-04 DIAGNOSIS — I480 Paroxysmal atrial fibrillation: Secondary | ICD-10-CM

## 2023-03-04 DIAGNOSIS — N401 Enlarged prostate with lower urinary tract symptoms: Secondary | ICD-10-CM

## 2023-03-04 MED ORDER — FINASTERIDE 5 MG PO TABS
5.0000 mg | ORAL_TABLET | Freq: Every day | ORAL | 3 refills | Status: AC
Start: 2023-03-04 — End: ?

## 2023-03-04 NOTE — Assessment & Plan Note (Addendum)
On Tamsulosin 0.8 mg QD Still has nocturia Added finasteride

## 2023-03-04 NOTE — Assessment & Plan Note (Signed)
BP Readings from Last 1 Encounters:  03/04/23 133/74   Remains WNL at home Well controlled, on Metoprolol and Diltiazem Plan to add ARB if persistently elevated, had cough with ACEi Counseled for compliance with the medications Advised DASH diet and moderate exercise/walking, at least 150 mins/week

## 2023-03-04 NOTE — Assessment & Plan Note (Signed)
Has peripheral vision loss and memory changes since the stroke °On Coumadin and statin currently °

## 2023-03-04 NOTE — Assessment & Plan Note (Addendum)
Lab Results  Component Value Date   HGBA1C 6.3 (H) 10/29/2022   Associated with HTN and HLD Well-controlled Was on Glimepiride 1 mg QD, now diet controlled Would prefer to start Metformin or SGLT2 inhibitor if HbA1C increases now On statin Needs to see Ophthalmologist for diabetic eye exam

## 2023-03-04 NOTE — Assessment & Plan Note (Signed)
On metoprolol and diltiazem for rate control On Coumadin for AC Followed by cardiology and AC clinic 

## 2023-03-04 NOTE — Assessment & Plan Note (Signed)
Due to COPD and history of lung cancer Needs oxygen supplies and portable O2, new prescription sent to [facility]

## 2023-03-04 NOTE — Assessment & Plan Note (Addendum)
Well-controlled Started DuoNeb for maintenance neb Does not like to use inhaler due to cost concern

## 2023-03-04 NOTE — Assessment & Plan Note (Signed)
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  Had offered to refer to Vascular surgeon Wants to discuss with his Cardiologist - Dr [name] 

## 2023-03-04 NOTE — Assessment & Plan Note (Signed)
Lipid profile reviewed On Atorvastatin and fenofibrate Would prefer to add Vascepa, but cost is a limiting factor currently

## 2023-03-04 NOTE — Patient Instructions (Addendum)
Please start taking Finasteride for urinary frequency.  Please take Mucinex for cough.  Please continue to take medications as prescribed.  Please continue to follow low salt diet and ambulate as tolerated.

## 2023-03-04 NOTE — Progress Notes (Signed)
Established Patient Office Visit  Subjective:  Patient ID: Randall Sullivan, male    DOB: 01-27-1951  Age: 72 y.o. MRN: [id number]  CC:  Chief Complaint  Patient presents with   COPD    Four month follow up    HPI Randall Sullivan is a 72 y.o. male with past medical history of HTN, PAF, CVA, lung cancer s/p lobectomy, HLD and insomnia who presents for f/u of his chronic medical conditions.  He takes Coumadin for PAF and takes statin for HLD.  Denies any chest pain, dyspnea or palpitations currently.  Denies any recent change in sensation over UE or LE.  He complains of chronic intermittent dizziness, which improves with  meclizine.  He reports recent worsening of cough and has difficulty clearing mucus, but has noticed improvement in dyspnea with nebulizer treatment.  He denies any fever or chills.  He uses albuterol nebulizer for dyspnea or wheezing and uses it about 2-3 times in a day.  Denies any recent fever, chills or hemoptysis.  He has home O2 with oxygen tank, but has difficulty taking it outside.  He requires O2 as needed upon ambulation. O2 sat at rest: 93% O2 sat with ambulation: 86% O2 sat with O2: 94%  BPH: He reports nocturia, about 3-4 episodes at nighttime despite taking Flomax 0.8 mg qHS.  Denies any dysuria or hematuria currently.  Denies weak urinary stream currently.   Past Medical History:  Diagnosis Date   A-fib [facility]) 2017   after last lung surgery patient had a history if Afib documented in hospital    Acute appendicitis 07/14/2021   Arthritis    back & legs ( knees)   Cancer (HCC)    lung   Complication of anesthesia    agitated upon waking from anesth.    Emphysema    Emphysema of lung (HCC)    Hyperlipidemia    Hypertension    OSA (obstructive sleep apnea) 11/25/2022   Oxygen dependent    2L continuous   Stroke [facility])    Tobacco abuse     Past Surgical History:  Procedure Laterality Date   APPENDECTOMY  07/15/2021   HERNIA REPAIR Bilateral 1999    umbilical   LAPAROSCOPIC APPENDECTOMY N/A 07/15/2021   Procedure: APPENDECTOMY LAPAROSCOPIC;  Surgeon: Lucretia Roers, MD;  Location: [facility];  Service: General;  Laterality: N/A;   VIDEO ASSISTED THORACOSCOPY Right 12/21/2018   Procedure: VIDEO ASSISTED THORACOSCOPY AND RESECTION OF BLEBS RIGHT LOWER LOBE;  Surgeon: Loreli Slot, MD;  Location: [facility] OR;  Service: Thoracic;  Laterality: Right;   VIDEO ASSISTED THORACOSCOPY (VATS)/ LOBECTOMY Left 08/01/2016   Procedure: VIDEO ASSISTED THORACOSCOPY (VATS)/ LEFT UPPER LOBECTOMY with lymph node sampling and onQ placement;  Surgeon: Loreli Slot, MD;  Location: [facility] OR;  Service: Thoracic;  Laterality: Left;   VIDEO ASSISTED THORACOSCOPY (VATS)/WEDGE RESECTION Right 12/09/2018   Procedure: VIDEO ASSISTED THORACOSCOPY (VATS)/WEDGE RESECTION;  Surgeon: Loreli Slot, MD;  Location: [facility] OR;  Service: Thoracic;  Laterality: Right;   VIDEO BRONCHOSCOPY Bilateral 12/21/2018   Procedure: VIDEO BRONCHOSCOPY;  Surgeon: Loreli Slot, MD;  Location: [facility] OR;  Service: Thoracic;  Laterality: Bilateral;   VIDEO BRONCHOSCOPY WITH ENDOBRONCHIAL NAVIGATION N/A 07/03/2016   Procedure: VIDEO BRONCHOSCOPY WITH ENDOBRONCHIAL NAVIGATION;  Surgeon: Loreli Slot, MD;  Location: [facility] OR;  Service: Thoracic;  Laterality: N/A;   VIDEO BRONCHOSCOPY WITH INSERTION OF INTERBRONCHIAL VALVE (IBV) N/A 12/11/2018   Procedure: VIDEO BRONCHOSCOPY WITH INSERTION OF INTERBRONCHIAL VALVE (IBV);  Surgeon: Loreli Slot, MD;  Location: [facility] OR;  Service: Thoracic;  Laterality: N/A;   VIDEO BRONCHOSCOPY WITH INSERTION OF INTERBRONCHIAL VALVE (IBV) N/A 02/10/2019   Procedure: VIDEO BRONCHOSCOPY WITH REMOVAL OF INTERBRONCHIAL VALVE (IBV);  Surgeon: Loreli Slot, MD;  Location: [facility] OR;  Service: Thoracic;  Laterality: N/A;    Family History  Problem Relation Age of Onset   Cancer Mother    Cancer Sister    Aneurysm Sister    Kidney disease  Paternal Aunt    Asthma Brother    Cancer - Lung Brother    Cancer Brother    Arthritis Brother     Social History   Socioeconomic History   Marital status: Married    Spouse name: Not on file   Number of children: 2   Years of education: Not on file   Highest education level: 9th grade  Occupational History   Occupation: Vinyl siding   Occupation: Retired  Tobacco Use   Smoking status: Former    Packs/day: 1.00    Years: 40.00    Additional pack years: 0.00    Total pack years: 40.00    Types: Cigarettes    Quit date: 05/28/2016    Years since quitting: 6.7   Smokeless tobacco: Never  Vaping Use   Vaping Use: Never used  Substance and Sexual Activity   Alcohol use: Not Currently    Comment: quit- 2016   Drug use: No   Sexual activity: Not Currently  Other Topics Concern   Not on file  Social History Narrative   Married for 20 years,second.Lives with wife. 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   Social Determinants of Health   Financial Resource Strain: Low Risk  (03/03/2023)   Overall Financial Resource Strain (CARDIA)    Difficulty of Paying Living Expenses: Not very hard  Food Insecurity: No Food Insecurity (03/03/2023)   Hunger Vital Sign    Worried About Running Out of Food in the Last Year: Never true    Ran Out of Food in the Last Year: Never true  Transportation Needs: No Transportation Needs (03/03/2023)   PRAPARE - Administrator, Civil Service (Medical): No    Lack of Transportation (Non-Medical): No  Physical Activity: Insufficiently Active (03/03/2023)   Exercise Vital Sign    Days of Exercise per Week: 3 days    Minutes of Exercise per Session: 10 min  Stress: No Stress Concern Present (03/03/2023)   [employer] of Occupational Health - Occupational Stress Questionnaire    Feeling of Stress : Only a little  Social Connections: Moderately Isolated (03/03/2023)   Social Connection and Isolation Panel [NHANES]    Frequency of Communication  with Friends and Family: Twice a week    Frequency of Social Gatherings with Friends and Family: Once a week    Attends Religious Services: Never    Database administrator or Organizations: No    Attends Banker Meetings: Never    Marital Status: Married  Catering manager Violence: Not At Risk (07/31/2021)   Humiliation, Afraid, Rape, and Kick questionnaire    Fear of Current or Ex-Partner: No    Emotionally Abused: No    Physically Abused: No    Sexually Abused: No    Outpatient Medications Prior to Visit  Medication Sig Dispense Refill   acetaminophen (TYLENOL) 325 MG tablet Take 2 tablets (650 mg total) by mouth every 6 (six) hours as needed for mild pain (or Fever >/= 101).  albuterol (PROVENTIL) (2.5 MG/3ML) 0.083% nebulizer solution USE 1 VIAL IN NEBULIZER EVERY 6 HOURS AS NEEDED FOR WHEEZING AND FOR SHORTNESS OF BREATH 150 mL 0   albuterol (PROVENTIL) (2.5 MG/3ML) 0.083% nebulizer solution USE 1 VIAL IN NEBULIZER EVERY 6 HOURS AS NEEDED FOR WHEEZING OR SHORTNESS OF BREATH 180 mL 2   atorvastatin (LIPITOR) 40 MG tablet Take 1 tablet (40 mg total) by mouth daily. 90 tablet 3   Cholecalciferol (VITAMIN D3) 125 MCG (5000 UT) CAPS Take 1 capsule (5,000 Units total) by mouth daily. 30 capsule 0   diltiazem (CARDIZEM CD) 180 MG 24 hr capsule Take 1 capsule (180 mg total) by mouth daily. 90 capsule 3   fenofibrate (TRICOR) 145 MG tablet Take 1 tablet (145 mg total) by mouth daily. 90 tablet 1   ipratropium-albuterol (DUONEB) 0.5-2.5 (3) MG/3ML SOLN Take 3 mLs by nebulization every 6 (six) hours as needed. 360 mL 3   loratadine (CLARITIN) 10 MG tablet Take 1 tablet (10 mg total) by mouth daily. 90 tablet 1   meclizine (ANTIVERT) 25 MG tablet Take 1 tablet (25 mg total) by mouth 3 (three) times daily as needed for dizziness. 30 tablet 0   metoprolol succinate (TOPROL-XL) 50 MG 24 hr tablet TAKE 1 TABLET BY MOUTH ONCE DAILY WITH MEALS 90 tablet 0   OXYGEN Inhale 2-3 L into the  lungs continuous. Hx of lung ca, copd, and emphysema     polyethylene glycol (MIRALAX / GLYCOLAX) 17 g packet Take 17 g by mouth daily as needed for mild constipation. 14 each 0   tamsulosin (FLOMAX) 0.4 MG CAPS capsule Take 2 capsules (0.8 mg total) by mouth daily. 180 capsule 3   traZODone (DESYREL) 100 MG tablet Take 1 tablet (100 mg total) by mouth at bedtime. 90 tablet 1   warfarin (COUMADIN) 5 MG tablet Take 1 tablet (5 mg total) by mouth daily. 90 tablet 1   amoxicillin-clavulanate (AUGMENTIN) 875-125 MG tablet Take 1 tablet by mouth 2 (two) times daily. 14 tablet 0   methylPREDNISolone (MEDROL DOSEPAK) 4 MG TBPK tablet Take as package instructions. 1 each 0   No facility-administered medications prior to visit.    Allergies  Allergen Reactions   No Known Allergies     ROS Review of Systems  Constitutional:  Positive for fatigue. Negative for chills and fever.  HENT:  Negative for congestion, sinus pressure and sinus pain.   Eyes:  Positive for visual disturbance. Negative for redness.  Respiratory:  Positive for cough and shortness of breath.   Cardiovascular:  Negative for chest pain and palpitations.  Gastrointestinal:  Negative for diarrhea, nausea and vomiting.  Genitourinary:  Positive for frequency. Negative for dysuria and hematuria.  Musculoskeletal:  Negative for neck pain and neck stiffness.  Skin:  Negative for rash.  Neurological:  Positive for dizziness. Negative for tremors, seizures, syncope and headaches.  Psychiatric/Behavioral:  Positive for sleep disturbance. Negative for agitation and behavioral problems.       Objective:    Physical Exam Vitals reviewed.  Constitutional:      General: He is not in acute distress.    Appearance: He is not diaphoretic.  HENT:     Head: Normocephalic and atraumatic.     Nose: Nose normal.     Mouth/Throat:     Mouth: Mucous membranes are moist.  Eyes:     General: No scleral icterus.    Extraocular Movements:  Extraocular movements intact.  Cardiovascular:     Rate  and Rhythm: Normal rate and regular rhythm.     Pulses: Normal pulses.     Heart sounds: Normal heart sounds. No murmur heard. Pulmonary:     Breath sounds: Wheezing (Mild, b/l) present. No rales.  Abdominal:     Palpations: Abdomen is soft.     Tenderness: There is no abdominal tenderness.  Musculoskeletal:     Cervical back: Neck supple. No tenderness.     Right lower leg: No edema.     Left lower leg: No edema.  Skin:    General: Skin is warm.     Findings: No rash.  Neurological:     General: No focal deficit present.     Mental Status: He is alert and oriented to person, place, and time. Mental status is at baseline.     Sensory: No sensory deficit.  Psychiatric:        Mood and Affect: Mood normal.        Behavior: Behavior normal.     BP 133/74 (BP Location: Right Arm, Patient Position: Sitting, Cuff Size: Large)   Pulse 69   Ht 5\' 10"  (1.778 m)   Wt 221 lb 6.4 oz (100.4 kg)   SpO2 93%   BMI 31.77 kg/m  Wt Readings from Last 3 Encounters:  03/04/23 221 lb 6.4 oz (100.4 kg)  10/29/22 226 lb 9.6 oz (102.8 kg)  09/04/22 224 lb 9.6 oz (101.9 kg)    Lab Results  Component Value Date   TSH 1.590 07/05/2021   Lab Results  Component Value Date   WBC 6.7 06/11/2022   HGB 15.4 06/11/2022   HCT 47.9 06/11/2022   MCV 84.5 06/11/2022   PLT 200 06/11/2022   Lab Results  Component Value Date   NA 142 10/29/2022   K 4.7 10/29/2022   CHLORIDE 99 04/09/2017   CO2 26 10/29/2022   GLUCOSE 82 10/29/2022   BUN 11 10/29/2022   CREATININE 1.17 10/29/2022   BILITOT 0.4 10/29/2022   ALKPHOS 45 10/29/2022   AST 21 10/29/2022   ALT 31 10/29/2022   PROT 7.2 10/29/2022   ALBUMIN 4.8 10/29/2022   CALCIUM 9.6 10/29/2022   ANIONGAP 3 (L) 06/11/2022   EGFR 66 10/29/2022   Lab Results  Component Value Date   CHOL 170 10/29/2022   Lab Results  Component Value Date   HDL 32 (L) 10/29/2022   Lab Results   Component Value Date   LDLCALC 76 10/29/2022   Lab Results  Component Value Date   TRIG 391 (H) 10/29/2022   Lab Results  Component Value Date   CHOLHDL 5.3 (H) 10/29/2022   Lab Results  Component Value Date   HGBA1C 6.3 (H) 10/29/2022      Assessment & Plan:   Problem List Items Addressed This Visit    Diabetes mellitus (HCC) Lab Results  Component Value Date   HGBA1C 6.3 (H) 10/29/2022   Associated with HTN and HLD Well-controlled Was on Glimepiride 1 mg QD, now diet controlled Would prefer to start Metformin or SGLT2 inhibitor if HbA1C increases now On statin Needs to see Ophthalmologist for diabetic eye exam  Emphysema of lung (HCC) Well-controlled Started DuoNeb for maintenance neb Does not like to use inhaler due to cost concern  PAF (paroxysmal atrial fibrillation) (HCC) On metoprolol and diltiazem for rate control On Coumadin for [facility] Followed by cardiology and [facility]  Essential (primary) hypertension BP Readings from Last 1 Encounters:  03/04/23 133/74   Remains WNL at home  Well controlled, on Metoprolol and Diltiazem Plan to add ARB if persistently elevated, had cough with ACEi Counseled for compliance with the medications Advised DASH diet and moderate exercise/walking, at least 150 mins/week  Infrarenal abdominal aortic aneurysm (AAA) without rupture Infrarenal abdominal aortic aneurysm measuring up to 4.0 x 3.8 cm with a large burden of eccentric mural thrombus.  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 and memory changes since the stroke On Coumadin and statin currently  Benign prostatic hyperplasia with nocturia On Tamsulosin 0.8 mg QD Still has nocturia Added finasteride  Hyperlipidemia Lipid profile reviewed On Atorvastatin and fenofibrate Would prefer to add Vascepa, but cost is a limiting factor  currently  Chronic respiratory failure with hypoxia (HCC) Due to COPD and history of lung cancer Needs oxygen supplies and portable O2, new prescription sent to [facility]    Meds ordered this encounter  Medications   finasteride (PROSCAR) 5 MG tablet    Sig: Take 1 tablet (5 mg total) by mouth daily.    Dispense:  30 tablet    Refill:  3    Follow-up: Return in about 4 months (around 07/04/2023) for COPD and BPH.    Anabel Halon, MD

## 2023-03-05 LAB — CMP14+EGFR
ALT: 26 IU/L (ref 0–44)
AST: 22 IU/L (ref 0–40)
Albumin/Globulin Ratio: 2 (ref 1.2–2.2)
Albumin: 4.6 g/dL (ref 3.8–4.8)
Alkaline Phosphatase: 45 IU/L (ref 44–121)
BUN/Creatinine Ratio: 11 (ref 10–24)
BUN: 15 mg/dL (ref 8–27)
Bilirubin Total: 0.4 mg/dL (ref 0.0–1.2)
CO2: 25 mmol/L (ref 20–29)
Calcium: 9.2 mg/dL (ref 8.6–10.2)
Chloride: 101 mmol/L (ref 96–106)
Creatinine, Ser: 1.41 mg/dL — ABNORMAL HIGH (ref 0.76–1.27)
Globulin, Total: 2.3 g/dL (ref 1.5–4.5)
Glucose: 81 mg/dL (ref 70–99)
Potassium: 4.8 mmol/L (ref 3.5–5.2)
Sodium: 140 mmol/L (ref 134–144)
Total Protein: 6.9 g/dL (ref 6.0–8.5)
eGFR: 53 mL/min/{1.73_m2} — ABNORMAL LOW (ref >59)

## 2023-03-05 LAB — HEMOGLOBIN A1C
Est. average glucose Bld gHb Est-mCnc: 137 mg/dL
Hgb A1c MFr Bld: 6.4 % — ABNORMAL HIGH (ref 4.8–5.6)

## 2023-03-05 LAB — CBC
Hematocrit: 46.2 % (ref 37.5–51.0)
Hemoglobin: 15.3 g/dL (ref 13.0–17.7)
MCH: 27.1 pg (ref 26.6–33.0)
MCHC: 33.1 g/dL (ref 31.5–35.7)
MCV: 82 fL (ref 79–97)
Platelets: 218 10*3/uL (ref 150–450)
RBC: 5.65 x10E6/uL (ref 4.14–5.80)
RDW: 13.2 % (ref 11.6–15.4)
WBC: 7 10*3/uL (ref 3.4–10.8)

## 2023-03-07 ENCOUNTER — Telehealth: Payer: Self-pay | Admitting: Internal Medicine

## 2023-03-07 NOTE — Telephone Encounter (Signed)
Terri called from [facility] received an order on  patient said he was trying to switch his oxygen from [facility] to [place] [facility], but Terri said  can not take patient at this time due to patient is cap out on his oxygen has to wait a year before switching company. [facility] # [id number]

## 2023-03-10 NOTE — Telephone Encounter (Signed)
Left message for Randall Sullivan to call our office back

## 2023-03-19 ENCOUNTER — Encounter: Payer: Self-pay | Admitting: Internal Medicine

## 2023-03-22 ENCOUNTER — Encounter: Payer: Self-pay | Admitting: Internal Medicine

## 2023-03-24 ENCOUNTER — Other Ambulatory Visit: Payer: Self-pay

## 2023-03-24 MED ORDER — METOPROLOL SUCCINATE ER 50 MG PO TB24: ORAL_TABLET | ORAL | 0 refills | Status: DC

## 2023-03-30 ENCOUNTER — Encounter: Payer: Self-pay | Admitting: Internal Medicine

## 2023-03-31 ENCOUNTER — Other Ambulatory Visit: Payer: Self-pay

## 2023-03-31 DIAGNOSIS — I480 Paroxysmal atrial fibrillation: Secondary | ICD-10-CM

## 2023-03-31 DIAGNOSIS — Z8673 Personal history of transient ischemic attack (TIA), and cerebral infarction without residual deficits: Secondary | ICD-10-CM

## 2023-03-31 MED ORDER — WARFARIN SODIUM 5 MG PO TABS
5.0000 mg | ORAL_TABLET | Freq: Every day | ORAL | 1 refills | Status: DC
Start: 2023-03-31 — End: 2024-02-11

## 2023-03-31 NOTE — Progress Notes (Deleted)
Randall Sullivan, male    DOB: 03/30/1951    MRN: [id number]   Brief patient profile:  72  yo*** *** referred to pulmonary clinic in [place]  04/01/2023 by *** for ***      History of Present Illness  04/01/2023  Pulmonary/ 1st office eval/ [name] / [place] [facility]  No chief complaint on file.    Dyspnea:  *** Cough: *** Sleep: *** SABA use: *** 02: *** Lung cancer screen: ***   Outpatient Medications Prior to Visit  Medication Sig Dispense Refill   acetaminophen (TYLENOL) 325 MG tablet Take 2 tablets (650 mg total) by mouth every 6 (six) hours as needed for mild pain (or Fever >/= 101).     albuterol (PROVENTIL) (2.5 MG/3ML) 0.083% nebulizer solution USE 1 VIAL IN NEBULIZER EVERY 6 HOURS AS NEEDED FOR WHEEZING AND FOR SHORTNESS OF BREATH 150 mL 0   albuterol (PROVENTIL) (2.5 MG/3ML) 0.083% nebulizer solution USE 1 VIAL IN NEBULIZER EVERY 6 HOURS AS NEEDED FOR WHEEZING OR SHORTNESS OF BREATH 180 mL 2   atorvastatin (LIPITOR) 40 MG tablet Take 1 tablet (40 mg total) by mouth daily. 90 tablet 3   Cholecalciferol (VITAMIN D3) 125 MCG (5000 UT) CAPS Take 1 capsule (5,000 Units total) by mouth daily. 30 capsule 0   diltiazem (CARDIZEM CD) 180 MG 24 hr capsule Take 1 capsule (180 mg total) by mouth daily. 90 capsule 3   fenofibrate (TRICOR) 145 MG tablet Take 1 tablet (145 mg total) by mouth daily. 90 tablet 1   finasteride (PROSCAR) 5 MG tablet Take 1 tablet (5 mg total) by mouth daily. 30 tablet 3   ipratropium-albuterol (DUONEB) 0.5-2.5 (3) MG/3ML SOLN Take 3 mLs by nebulization every 6 (six) hours as needed. 360 mL 3   loratadine (CLARITIN) 10 MG tablet Take 1 tablet (10 mg total) by mouth daily. 90 tablet 1   meclizine (ANTIVERT) 25 MG tablet Take 1 tablet (25 mg total) by mouth 3 (three) times daily as needed for dizziness. 30 tablet 0   metoprolol succinate (TOPROL-XL) 50 MG 24 hr tablet TAKE 1 TABLET BY MOUTH ONCE DAILY WITH MEALS 90 tablet 0   OXYGEN Inhale 2-3 L into the lungs  continuous. Hx of lung ca, copd, and emphysema     polyethylene glycol (MIRALAX / GLYCOLAX) 17 g packet Take 17 g by mouth daily as needed for mild constipation. 14 each 0   tamsulosin (FLOMAX) 0.4 MG CAPS capsule Take 2 capsules (0.8 mg total) by mouth daily. 180 capsule 3   traZODone (DESYREL) 100 MG tablet Take 1 tablet (100 mg total) by mouth at bedtime. 90 tablet 1   warfarin (COUMADIN) 5 MG tablet Take 1 tablet (5 mg total) by mouth daily. 90 tablet 1   No facility-administered medications prior to visit.    Past Medical History:  Diagnosis Date   A-fib [facility]) 2017   after last lung surgery patient had a history if Afib documented in hospital    Acute appendicitis 07/14/2021   Arthritis    back & legs ( knees)   Cancer (HCC)    lung   Complication of anesthesia    agitated upon waking from anesth.    Emphysema    Emphysema of lung (HCC)    Hyperlipidemia    Hypertension    OSA (obstructive sleep apnea) 11/25/2022   Oxygen dependent    2L continuous   Stroke (HCC)    Tobacco abuse       Objective:  There were no vitals taken for this visit.         Assessment   No problem-specific Assessment & Plan notes found for this encounter.     [name], MD 03/31/2023

## 2023-04-01 ENCOUNTER — Encounter: Payer: Self-pay | Admitting: Internal Medicine

## 2023-04-01 ENCOUNTER — Other Ambulatory Visit: Payer: Self-pay

## 2023-04-01 ENCOUNTER — Institutional Professional Consult (permissible substitution): Payer: Medicare Other | Admitting: Internal Medicine

## 2023-04-01 DIAGNOSIS — C3492 Malignant neoplasm of unspecified part of left bronchus or lung: Secondary | ICD-10-CM

## 2023-04-01 MED ORDER — ALBUTEROL SULFATE (2.5 MG/3ML) 0.083% IN NEBU
INHALATION_SOLUTION | RESPIRATORY_TRACT | 2 refills | Status: DC
Start: 2023-04-01 — End: 2023-05-29

## 2023-04-08 ENCOUNTER — Ambulatory Visit: Payer: Medicare Other | Attending: Cardiology | Admitting: *Deleted

## 2023-04-08 DIAGNOSIS — I639 Cerebral infarction, unspecified: Secondary | ICD-10-CM

## 2023-04-08 DIAGNOSIS — I480 Paroxysmal atrial fibrillation: Secondary | ICD-10-CM | POA: Diagnosis not present

## 2023-04-08 DIAGNOSIS — Z5181 Encounter for therapeutic drug level monitoring: Secondary | ICD-10-CM | POA: Diagnosis not present

## 2023-04-08 LAB — POCT INR: INR: 2.5 (ref 2.0–3.0)

## 2023-04-08 NOTE — Patient Instructions (Signed)
Continue warfarin 1 tablet daily except 1/2 tablet on Tuesdays and Fridays ?Recheck INR in 6 weeks.  ?

## 2023-04-29 ENCOUNTER — Encounter: Payer: Self-pay | Admitting: Internal Medicine

## 2023-05-05 NOTE — Progress Notes (Unsigned)
Randall Sullivan, male    DOB: [birth year]    MRN: [id number]   Brief patient profile:  10  yowm  quit smoking 04/2016 @ dx NSCLC  self  referred to pulmonary clinic in [place]  05/06/2023 for worsening doe and cough/ throat congestion since around  12/2022   Baseline limited R sided weakness p cva 2022 / let off at door, no ex   Randall Sullivan  last ov /virtual   DIAGNOSIS:  1) Stage IA (T1b, N0, M0) non-small cell lung cancer, well-differentiated adenocarcinoma presented with left upper lobeS pulmonary nodule diagnosed in September 2017. 2) stage Ia non-small cell lung cancer diagnosed in March 2020.   PRIOR THERAPY:  1) Status post left VATS, left upper lobectomy with mediastinal lymph node dissection under the care of Dr. Dorris Fetch on 08/01/2016. 2) status post right VATS with wedge resection of right upper lobe nodule and bleb resection under the care of Dr. Dorris Fetch on December 10, 2018.   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 Never RT or chemo      History of Present Illness  05/06/2023  Pulmonary/ 1st office eval/  / [place] [facility]  Chief Complaint  Patient presents with   Establish Care   Dyspnea:  more difficult room to room  Cough: rattling but min white day >> noct  Sleep: bed is flat  with 3 pillows under head, does fine  SABA use: albuterol neb tid  02: uses p exertion  Lung cancer screen: per Randall Sullivan   No obvious day to day or daytime pattern/variability or assoc  purulent sputum or mucus plugs or hemoptysis or cp or chest tightness, subjective wheeze or overt sinus or hb symptoms.   Sleeping  without nocturnal  or early am exacerbation  of respiratory  c/o's or need for noct saba. Also denies any obvious fluctuation of symptoms with weather or environmental changes or other aggravating or alleviating  factors except as outlined above   No unusual exposure hx or h/o childhood pna/ asthma or knowledge of premature birth.  Current Allergies, Complete Past Medical History, Past Surgical History, Family History, and Social History were reviewed in [employer] record.  ROS  The following are not active complaints unless bolded Hoarseness, sore throat, dysphagia, dental problems, itching, sneezing,  nasal congestion or discharge of excess mucus or purulent secretions, ear ache,   fever, chills, sweats, unintended wt loss or wt gain, classically pleuritic or exertional cp,  orthopnea pnd or arm/hand swelling  or leg swelling, presyncope, palpitations, abdominal pain, anorexia, nausea, vomiting, diarrhea  or change in bowel habits or change in bladder habits, change in stools or change in urine, dysuria, hematuria,  rash, arthralgias, visual complaints, headache, numbness, weakness or ataxia or problems with walking or coordination/uses cane ,  change in mood or  memory.            Outpatient Medications Prior to Visit  Medication Sig Dispense Refill   acetaminophen (TYLENOL) 325 MG tablet Take 2 tablets (650 mg total) by mouth every 6 (six) hours as needed for mild pain (or Fever >/= 101).     albuterol (PROVENTIL) (2.5 MG/3ML) 0.083% nebulizer solution USE 1 VIAL IN NEBULIZER EVERY 6 HOURS AS NEEDED FOR WHEEZING AND FOR SHORTNESS OF BREATH 150 mL 0   albuterol (PROVENTIL) (2.5 MG/3ML) 0.083% nebulizer solution USE 1  VIAL IN NEBULIZER EVERY 6 HOURS AS NEEDED FOR WHEEZING OR SHORTNESS OF BREATH 180 mL 2   atorvastatin (LIPITOR) 40 MG tablet Take 1 tablet (40 mg total) by mouth daily. 90 tablet 3   Cholecalciferol (VITAMIN D3) 125 MCG (5000 UT) CAPS Take 1 capsule (5,000 Units total) by mouth daily. 30 capsule 0   diltiazem (CARDIZEM CD) 180 MG 24 hr capsule Take 1 capsule (180 mg total) by mouth daily. 90 capsule 3   fenofibrate (TRICOR) 145 MG tablet Take 1 tablet (145 mg total)  by mouth daily. 90 tablet 1   finasteride (PROSCAR) 5 MG tablet Take 1 tablet (5 mg total) by mouth daily. 30 tablet 3   ipratropium-albuterol (DUONEB) 0.5-2.5 (3) MG/3ML SOLN Take 3 mLs by nebulization every 6 (six) hours as needed. 360 mL 3   loratadine (CLARITIN) 10 MG tablet Take 1 tablet (10 mg total) by mouth daily. 90 tablet 1   meclizine (ANTIVERT) 25 MG tablet Take 1 tablet (25 mg total) by mouth 3 (three) times daily as needed for dizziness. 30 tablet 0   metoprolol succinate (TOPROL-XL) 50 MG 24 hr tablet TAKE 1 TABLET BY MOUTH ONCE DAILY WITH MEALS 90 tablet 0   OXYGEN Inhale 2-3 L into the lungs continuous. Hx of lung ca, copd, and emphysema     polyethylene glycol (MIRALAX / GLYCOLAX) 17 g packet Take 17 g by mouth daily as needed for mild constipation. 14 each 0   tamsulosin (FLOMAX) 0.4 MG CAPS capsule Take 2 capsules (0.8 mg total) by mouth daily. 180 capsule 3   traZODone (DESYREL) 100 MG tablet Take 1 tablet (100 mg total) by mouth at bedtime. 90 tablet 1   warfarin (COUMADIN) 5 MG tablet Take 1 tablet (5 mg total) by mouth daily. 90 tablet 1   No facility-administered medications prior to visit.    Past Medical History:  Diagnosis Date   A-fib [facility]) 2017   after last lung surgery patient had a history if Afib documented in hospital    Acute appendicitis 07/14/2021   Arthritis    back & legs ( knees)   Cancer (HCC)    lung   Complication of anesthesia    agitated upon waking from anesth.    Emphysema    Emphysema of lung (HCC)    Hyperlipidemia    Hypertension    OSA (obstructive sleep apnea) 11/25/2022   Oxygen dependent    2L continuous   Stroke (HCC)    Tobacco abuse       Objective:     BP 130/73   Pulse 67   Ht 5\' 10"  (1.778 m)   Wt 220 lb (99.8 kg)   SpO2 92%   BMI 31.57 kg/m   SpO2: 92 % RA  Elderly somewhat frail wm/ rattling cough / upper airway wheeze   HEENT : Oropharynx  clear/ edentulous     NECK :  without  apparent JVD/  palpable Nodes/TM or tracheal deviation    LUNGS: no acc muscle use,  Mild barrel  contour chest wall with bilateral  Distant bs s audible wheeze and  without cough on insp or exp maneuvers  and mild  Hyperresonant  to  percussion bilaterally     CV:  RRR  no s3 or murmur or increase in P2, and no edema   ABD:  soft and nontender with pos end  insp Hoover's  in the supine position.  No bruits or organomegaly appreciated   MS:  Nl gait/ ext warm without deformities Or obvious joint restrictions  calf tenderness, cyanosis or clubbing     SKIN: warm and dry without lesions    NEURO:  alert, approp, nl sensorium with  no motor or cerebellar deficits apparent.        Assessment   COPD GOLD 3 using 02 prn Quit smoking 04/2016 - very limited funds (Medicare B only)  - PFT's  12/07/18   FEV1 1.14 (34 % ) ratio 0.30  p 12 % improvement from saba p ?saba prior to study with DLCO  16.39 (62%)   and FV curve classically concave - 05/06/2023 trial of yupelri with AM albuterol neb and prn alb neb for the rest of the day   Group D (now reclassified as E) in terms of symptom/risk and laba/lama/ICS  therefore appropriate rx at this point >>>  lama/laba/ics but he cannot afford any of the standard medications so will first see if free samples of lama help his ability to mobilize or help his "wheezing" which may be upper airway related or AB so rx as above plus Prednisone 10 mg take  4 each am x 2 days,   2 each am x 2 days,  1 each am x 2 days and stop   If not better with nebs rec try empirical gerd rx otc and f/u in 6 weeks to regroup with possible free triple rx per [place] Markus Daft)       Chronic respiratory failure with hypoxia (HCC) 05/06/2023   Walked on RA  x  2  lap(s) =  approx 300  ft  @ slow/cane pace, stopped due to tired and sob  with lowest 02 sats 86% on the second lap which exceeded any activity he describes at home by a factor of 2   Rec No 02 at rest needed   Make sure you check your  oxygen saturation  AT  your highest level of activity (not after you stop)   to be sure it stays over 90% and adjust  02 flow upward to maintain this level if needed but remember to turn it back to previous settings when you stop (to conserve your supply).   Each maintenance medication was reviewed in detail including emphasizing most importantly the difference between maintenance and prns and under what circumstances the prns are to be triggered using an action plan format where appropriate.  Total time for H and P, chart review, counseling, reviewing hfa/neb device(s) , directly observing portions of ambulatory 02 saturation study/ and generating customized AVS unique to this office visit / same day charting = 48 min new pt eval                    [name], MD 05/06/2023

## 2023-05-06 ENCOUNTER — Ambulatory Visit: Payer: Medicare Other | Admitting: Internal Medicine

## 2023-05-06 ENCOUNTER — Encounter: Payer: Self-pay | Admitting: Internal Medicine

## 2023-05-06 VITALS — BP 130/73 | HR 67 | Ht 70.0 in | Wt 220.0 lb

## 2023-05-06 DIAGNOSIS — J9611 Chronic respiratory failure with hypoxia: Secondary | ICD-10-CM | POA: Diagnosis not present

## 2023-05-06 DIAGNOSIS — J449 Chronic obstructive pulmonary disease, unspecified: Secondary | ICD-10-CM

## 2023-05-06 MED ORDER — YUPELRI 175 MCG/3ML IN SOLN
RESPIRATORY_TRACT | Status: DC
Start: 2023-05-06 — End: 2023-05-27

## 2023-05-06 MED ORDER — PREDNISONE 10 MG PO TABS
ORAL_TABLET | ORAL | 0 refills | Status: DC
Start: 2023-05-06 — End: 2023-07-15

## 2023-05-06 NOTE — Patient Instructions (Addendum)
For cough/ congestion  > mucinex dm  up to maximum of  1200 mg every 12 hours and use the flutter valve as much as you can   Prednisone 10 mg take  4 each am x 2 days,   2 each am x 2 days,  1 each am x 2 days and stop   Add yupelri to the am dose ONLY   of Albuterol 1st thing in morning   Also  Ok to try albuterol one vial by itself 15 min before an activity (on alternating days)  that you know would usually make you short of breath and see if it makes any difference and if makes none then don't take albuterol after activity unless you can't catch your breath as this means it's the resting that helps, not the albuterol.  If not better please add:  Try prilosec otc 20mg   Take 30-60 min before first meal of the day and Pepcid ac (famotidine) 20 mg one  after supper until   wheeze and  cough are completely gone for at least a week without the need for cough suppression  Make sure you check your oxygen saturation  AT  your highest level of activity (not after you stop)   to be sure it stays over 90% and adjust  02 flow upward to maintain this level if needed but remember to turn it back to previous settings when you stop (to conserve your supply).   Please schedule a follow up office visit in 4 weeks, sooner if needed  with all medications /inhalers/ solutions/flutter valve  in hand so we can verify exactly what you are taking. This includes all medications from all doctors and over the counters

## 2023-05-06 NOTE — Assessment & Plan Note (Addendum)
Quit smoking 04/2016 - very limited funds (Medicare B only)  - PFT's  12/07/18   FEV1 1.14 (34 % ) ratio 0.30  p 12 % improvement from saba p ?saba prior to study with DLCO  16.39 (62%)   and FV curve classically concave - 05/06/2023 trial of yupelri with AM albuterol neb and prn alb neb for the rest of the day   Group D (now reclassified as E) in terms of symptom/risk and laba/lama/ICS  therefore appropriate rx at this point >>>  lama/laba/ics but he cannot afford any of the standard medications so will first see if free samples of lama help his ability to mobilize or help his "wheezing" which may be upper airway related or AB so rx as above plus Prednisone 10 mg take  4 each am x 2 days,   2 each am x 2 days,  1 each am x 2 days and stop   If not better with nebs rec try empirical gerd rx otc and f/u in 6 weeks to regroup with possible free triple rx per [place] Markus Daft)

## 2023-05-06 NOTE — Assessment & Plan Note (Addendum)
05/06/2023   Walked on RA  x  2  lap(s) =  approx 300  ft  @ slow/cane pace, stopped due to tired and sob  with lowest 02 sats 86% on the second lap which exceeded any activity he describes at home by a factor of 2   Rec No 02 at rest needed   Make sure you check your oxygen saturation  AT  your highest level of activity (not after you stop)   to be sure it stays over 90% and adjust  02 flow upward to maintain this level if needed but remember to turn it back to previous settings when you stop (to conserve your supply).   Each maintenance medication was reviewed in detail including emphasizing most importantly the difference between maintenance and prns and under what circumstances the prns are to be triggered using an action plan format where appropriate.  Total time for H and P, chart review, counseling, reviewing hfa/neb device(s) , directly observing portions of ambulatory 02 saturation study/ and generating customized AVS unique to this office visit / same day charting = 48 min new pt eval

## 2023-05-20 ENCOUNTER — Ambulatory Visit: Payer: Medicare Other | Attending: Cardiology | Admitting: *Deleted

## 2023-05-20 DIAGNOSIS — Z5181 Encounter for therapeutic drug level monitoring: Secondary | ICD-10-CM | POA: Diagnosis not present

## 2023-05-20 DIAGNOSIS — I480 Paroxysmal atrial fibrillation: Secondary | ICD-10-CM

## 2023-05-20 DIAGNOSIS — I639 Cerebral infarction, unspecified: Secondary | ICD-10-CM

## 2023-05-20 LAB — POCT INR: INR: 2.4 (ref 2.0–3.0)

## 2023-05-20 NOTE — Patient Instructions (Signed)
Continue warfarin 1 tablet daily except 1/2 tablet on Tuesdays and Fridays ?Recheck INR in 6 weeks.  ?

## 2023-05-27 ENCOUNTER — Telehealth: Payer: Self-pay | Admitting: *Deleted

## 2023-05-27 MED ORDER — YUPELRI 175 MCG/3ML IN SOLN: RESPIRATORY_TRACT | 11 refills | Status: DC

## 2023-05-27 NOTE — Telephone Encounter (Signed)
Patient called and states he is out of sample of Yupelri and would like to see if there's another sample available or if patient can get a prescription but is worried about the cost of prescription.   Patient uses [facility] pharmacy and has upcoming OV on 06/24/23 with Dr. [name].  Please call patient and advise [phone]

## 2023-05-27 NOTE — Telephone Encounter (Signed)
Sent in to [facility], they should be able to do the work to see what the cost will be and let them know probably tomorrow

## 2023-05-28 ENCOUNTER — Telehealth: Payer: Self-pay | Admitting: Internal Medicine

## 2023-05-28 NOTE — Telephone Encounter (Signed)
She wasnts Randall Sullivan to call her back and she is calling on Randall Sullivan 30-Jun-1951 They need PT's  ins info and last office notes. Fax is [phone]  [facility] @  [facility] [phone]

## 2023-05-29 ENCOUNTER — Encounter: Payer: Self-pay | Admitting: Internal Medicine

## 2023-05-29 ENCOUNTER — Other Ambulatory Visit: Payer: Self-pay

## 2023-05-29 DIAGNOSIS — C3492 Malignant neoplasm of unspecified part of left bronchus or lung: Secondary | ICD-10-CM

## 2023-05-29 MED ORDER — ALBUTEROL SULFATE (2.5 MG/3ML) 0.083% IN NEBU
INHALATION_SOLUTION | RESPIRATORY_TRACT | 2 refills | Status: DC
Start: 2023-05-29 — End: 2023-07-14

## 2023-05-29 NOTE — Telephone Encounter (Signed)
FYI thanks.

## 2023-06-09 ENCOUNTER — Other Ambulatory Visit: Payer: Self-pay | Admitting: Internal Medicine

## 2023-06-09 DIAGNOSIS — C3490 Malignant neoplasm of unspecified part of unspecified bronchus or lung: Secondary | ICD-10-CM

## 2023-06-10 ENCOUNTER — Encounter (HOSPITAL_COMMUNITY): Payer: Self-pay

## 2023-06-10 ENCOUNTER — Ambulatory Visit (HOSPITAL_COMMUNITY): Payer: Medicare Other

## 2023-06-11 NOTE — Telephone Encounter (Signed)
Randall Sullivan is calling back. A good number is [phone] . This is her direct line.

## 2023-06-11 NOTE — Telephone Encounter (Signed)
Called the number provided and was directed to answering service that sells at home medical alert devices.  I called another number [phone]- was placed on long hold   Will call back

## 2023-06-16 ENCOUNTER — Other Ambulatory Visit (HOSPITAL_COMMUNITY): Payer: Medicare Other

## 2023-06-17 NOTE — Telephone Encounter (Signed)
Notes faxed to [phone]

## 2023-06-21 ENCOUNTER — Encounter: Payer: Self-pay | Admitting: Internal Medicine

## 2023-06-23 ENCOUNTER — Other Ambulatory Visit: Payer: Self-pay

## 2023-06-23 MED ORDER — METOPROLOL SUCCINATE ER 50 MG PO TB24: ORAL_TABLET | ORAL | 0 refills | Status: DC

## 2023-06-23 NOTE — Progress Notes (Deleted)
Randall Sullivan, male    DOB: 1950-10-05    MRN: [id number]   Brief patient profile:  75  yowm  quit smoking 04/2016 @ dx NSCLC  self  referred to pulmonary clinic in [place]  05/06/2023 for worsening doe and cough/ throat congestion since around  12/2022   Baseline limited R sided weakness p cva 2022 / let off at door, no ex   Randall Sullivan  last ov /virtual   DIAGNOSIS:  1) Stage IA (T1b, N0, M0) non-small cell lung cancer, well-differentiated adenocarcinoma presented with left upper lobeS pulmonary nodule diagnosed in September 2017. 2) stage Ia non-small cell lung cancer diagnosed in March 2020.   PRIOR THERAPY:  1) Status post left VATS, left upper lobectomy with mediastinal lymph node dissection under the care of Dr. Dorris Fetch on 08/01/2016. 2) status post right VATS with wedge resection of right upper lobe nodule and bleb resection under the care of Dr. Dorris Fetch on December 10, 2018.   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 Never RT or chemo      History of Present Illness  05/06/2023  Pulmonary/ 1st office eval/ [name] / [place] [facility]  Chief Complaint  Patient presents with   Establish Care   Dyspnea:  more difficult room to room  Cough: rattling but min white day >> noct  Sleep: bed is flat  with 3 pillows under head, does fine  SABA use: albuterol neb tid  02: uses p exertion  Lung cancer screen: per Randall Sullivan  Rec For cough/ congestion  > mucinex dm  up to maximum of  1200 mg every 12 hours and use the flutter valve as much as you can  Prednisone 10 mg take  4 each am x 2 days,   2 each am x 2 days,  1 each am x 2 days and stop  Add yupelri to the am dose ONLY   of Albuterol 1st thing in morning  Also  Ok to try albuterol one vial by itself 15 min before an activity (on alternating days)  that you know would  usually make you short of breath If not better please add:  Try prilosec otc 20mg   Take 30-60 min before first meal of the day and Pepcid ac (famotidine) 20 mg one  after supper until   wheeze and  cough are completely gone for at least a week without the need for cough suppression Make sure you check your oxygen saturation  AT  your highest level of activity (not after you stop)   to be sure it stays over 90%   Please schedule a follow up office visit in 4 weeks, sooner if needed  with all medications /inhalers/ solutions/flutter valve  in hand        06/24/2023  f/u ov/[name]/[name] re: *** maint on ***  No chief complaint on file.   Dyspnea:  *** Cough: *** Sleeping: ***   resp cc  SABA use: *** 02: ***  Lung cancer screening: ***   No obvious day to day or daytime variability or assoc excess/ purulent sputum or mucus plugs or hemoptysis or cp or chest tightness, subjective wheeze or overt sinus or hb symptoms.    Also denies any obvious fluctuation of symptoms with weather or environmental changes or other aggravating or alleviating factors except as outlined above   No  unusual exposure hx or h/o childhood pna/ asthma or knowledge of premature birth.  Current Allergies, Complete Past Medical History, Past Surgical History, Family History, and Social History were reviewed in [employer] record.  ROS  The following are not active complaints unless bolded Hoarseness, sore throat, dysphagia, dental problems, itching, sneezing,  nasal congestion or discharge of excess mucus or purulent secretions, ear ache,   fever, chills, sweats, unintended wt loss or wt gain, classically pleuritic or exertional cp,  orthopnea pnd or arm/hand swelling  or leg swelling, presyncope, palpitations, abdominal pain, anorexia, nausea, vomiting, diarrhea  or change in bowel habits or change in bladder habits, change in stools or change in urine, dysuria, hematuria,  rash,  arthralgias, visual complaints, headache, numbness, weakness or ataxia or problems with walking or coordination,  change in mood or  memory.        No outpatient medications have been marked as taking for the 06/24/23 encounter (Appointment) with Nyoka Cowden, MD.           Past Medical History:  Diagnosis Date   A-fib [facility]) 2017   after last lung surgery patient had a history if Afib documented in hospital    Acute appendicitis 07/14/2021   Arthritis    back & legs ( knees)   Cancer (HCC)    lung   Complication of anesthesia    agitated upon waking from anesth.    Emphysema    Emphysema of lung (HCC)    Hyperlipidemia    Hypertension    OSA (obstructive sleep apnea) 11/25/2022   Oxygen dependent    2L continuous   Stroke (HCC)    Tobacco abuse       Objective:    Wts   06/24/2023        ***   05/06/23 220 lb (99.8 kg)  03/04/23 221 lb 6.4 oz (100.4 kg)  10/29/22 226 lb 9.6 oz (102.8 kg)      Vital signs reviewed  06/24/2023  - Note at rest 02 sats  ***% on ***   General appearance:    ***   edentulous     Mild barr***      Assessment

## 2023-06-24 ENCOUNTER — Ambulatory Visit: Payer: Medicare Other | Admitting: Internal Medicine

## 2023-06-28 ENCOUNTER — Encounter: Payer: Self-pay | Admitting: Internal Medicine

## 2023-06-30 ENCOUNTER — Other Ambulatory Visit: Payer: Self-pay

## 2023-06-30 DIAGNOSIS — E785 Hyperlipidemia, unspecified: Secondary | ICD-10-CM

## 2023-06-30 MED ORDER — FENOFIBRATE 145 MG PO TABS
145.0000 mg | ORAL_TABLET | Freq: Every day | ORAL | 1 refills | Status: DC
Start: 2023-06-30 — End: 2023-12-29

## 2023-07-01 ENCOUNTER — Ambulatory Visit (HOSPITAL_COMMUNITY): Payer: Medicare Other

## 2023-07-01 ENCOUNTER — Other Ambulatory Visit: Payer: Self-pay

## 2023-07-01 ENCOUNTER — Encounter: Payer: Self-pay | Admitting: Internal Medicine

## 2023-07-01 DIAGNOSIS — N401 Enlarged prostate with lower urinary tract symptoms: Secondary | ICD-10-CM

## 2023-07-01 MED ORDER — FINASTERIDE 5 MG PO TABS: 5.0000 mg | ORAL_TABLET | Freq: Every day | ORAL | 3 refills | Status: DC

## 2023-07-08 ENCOUNTER — Ambulatory Visit (INDEPENDENT_AMBULATORY_CARE_PROVIDER_SITE_OTHER): Payer: Medicare Other | Admitting: *Deleted

## 2023-07-08 ENCOUNTER — Ambulatory Visit (HOSPITAL_COMMUNITY)
Admission: RE | Admit: 2023-07-08 | Discharge: 2023-07-08 | Disposition: A | Discharge status: Home / Self Care | Payer: Medicare Other | Source: Ambulatory Visit | Attending: Internal Medicine | Admitting: Internal Medicine

## 2023-07-08 DIAGNOSIS — I480 Paroxysmal atrial fibrillation: Secondary | ICD-10-CM

## 2023-07-08 DIAGNOSIS — J432 Centrilobular emphysema: Secondary | ICD-10-CM | POA: Diagnosis not present

## 2023-07-08 DIAGNOSIS — Z5181 Encounter for therapeutic drug level monitoring: Secondary | ICD-10-CM

## 2023-07-08 DIAGNOSIS — I700 Atherosclerosis of aorta: Secondary | ICD-10-CM | POA: Diagnosis not present

## 2023-07-08 DIAGNOSIS — C3490 Malignant neoplasm of unspecified part of unspecified bronchus or lung: Secondary | ICD-10-CM | POA: Diagnosis not present

## 2023-07-08 LAB — POCT INR: INR: 2.4 (ref 2.0–3.0)

## 2023-07-08 MED ORDER — IOHEXOL 300 MG/ML  SOLN
75.0000 mL | Freq: Once | INTRAMUSCULAR | Status: AC | PRN
  Administered 2023-07-08: 75 mL via INTRAVENOUS

## 2023-07-08 NOTE — Patient Instructions (Signed)
Continue warfarin 1 tablet daily except 1/2 tablet on Tuesdays and Fridays ?Recheck INR in 6 weeks.  ?

## 2023-07-13 ENCOUNTER — Encounter: Payer: Self-pay | Admitting: Internal Medicine

## 2023-07-14 ENCOUNTER — Other Ambulatory Visit: Payer: Self-pay

## 2023-07-14 DIAGNOSIS — C3492 Malignant neoplasm of unspecified part of left bronchus or lung: Secondary | ICD-10-CM

## 2023-07-14 MED ORDER — ALBUTEROL SULFATE (2.5 MG/3ML) 0.083% IN NEBU: INHALATION_SOLUTION | RESPIRATORY_TRACT | 2 refills | Status: DC

## 2023-07-15 ENCOUNTER — Ambulatory Visit (INDEPENDENT_AMBULATORY_CARE_PROVIDER_SITE_OTHER): Payer: Medicare Other | Admitting: Internal Medicine

## 2023-07-15 ENCOUNTER — Encounter: Payer: Self-pay | Admitting: Internal Medicine

## 2023-07-15 VITALS — BP 110/66 | HR 63 | Ht 70.0 in | Wt 223.8 lb

## 2023-07-15 DIAGNOSIS — I10 Essential (primary) hypertension: Secondary | ICD-10-CM

## 2023-07-15 DIAGNOSIS — J449 Chronic obstructive pulmonary disease, unspecified: Secondary | ICD-10-CM

## 2023-07-15 DIAGNOSIS — G4700 Insomnia, unspecified: Secondary | ICD-10-CM | POA: Diagnosis not present

## 2023-07-15 DIAGNOSIS — Z7901 Long term (current) use of anticoagulants: Secondary | ICD-10-CM

## 2023-07-15 DIAGNOSIS — R351 Nocturia: Secondary | ICD-10-CM | POA: Diagnosis not present

## 2023-07-15 DIAGNOSIS — I700 Atherosclerosis of aorta: Secondary | ICD-10-CM | POA: Diagnosis not present

## 2023-07-15 DIAGNOSIS — E1169 Type 2 diabetes mellitus with other specified complication: Secondary | ICD-10-CM | POA: Diagnosis not present

## 2023-07-15 DIAGNOSIS — I480 Paroxysmal atrial fibrillation: Secondary | ICD-10-CM

## 2023-07-15 DIAGNOSIS — E782 Mixed hyperlipidemia: Secondary | ICD-10-CM | POA: Diagnosis not present

## 2023-07-15 DIAGNOSIS — N401 Enlarged prostate with lower urinary tract symptoms: Secondary | ICD-10-CM

## 2023-07-15 MED ORDER — METHYLPREDNISOLONE 4 MG PO TBPK
ORAL_TABLET | ORAL | 0 refills | Status: DC
Start: 2023-07-15 — End: 2023-09-14

## 2023-07-15 MED ORDER — IPRATROPIUM-ALBUTEROL 0.5-2.5 (3) MG/3ML IN SOLN
RESPIRATORY_TRACT | 0 refills | Status: DC
Start: 2023-07-15 — End: 2023-08-15

## 2023-07-15 NOTE — Assessment & Plan Note (Signed)
On metoprolol and diltiazem for rate control On Coumadin for [facility] Followed by cardiology and [facility] clinic

## 2023-07-15 NOTE — Assessment & Plan Note (Addendum)
On statin for HLD BP well-controlled

## 2023-07-15 NOTE — Patient Instructions (Signed)
Please start taking Prednisone as prescribed.  Please use Duoneb instead of regular Albuterol nebulization.  Please continue to take medications as prescribed.  Please continue to follow low salt diet and ambulate as tolerated.

## 2023-07-15 NOTE — Assessment & Plan Note (Signed)
BP Readings from Last 1 Encounters:  07/15/23 110/66   Remains WNL at home Well controlled, on Metoprolol and Diltiazem (for PAF) Plan to add ARB if persistently elevated, had cough with ACEi Counseled for compliance with the medications Advised DASH diet and moderate exercise/walking, at least 150 mins/week

## 2023-07-15 NOTE — Assessment & Plan Note (Signed)
Lipid profile reviewed On Atorvastatin and fenofibrate Would prefer to add Vascepa, but cost is a limiting factor currently

## 2023-07-15 NOTE — Assessment & Plan Note (Signed)
Lab Results  Component Value Date   HGBA1C 6.4 (H) 03/04/2023   Associated with HTN and HLD Well-controlled Was on Glimepiride 1 mg QD, now diet controlled Would prefer to start Metformin or SGLT2 inhibitor if HbA1C increases now On statin Needs to see Ophthalmologist for diabetic eye exam

## 2023-07-15 NOTE — Progress Notes (Signed)
Established Patient Office Visit  Subjective:  Patient ID: Randall Sullivan, male    DOB: [birth year]  Age: 72 y.o. MRN: [id number]  CC:  Chief Complaint  Patient presents with   Diabetes    Follow up     HPI Randall Sullivan is a 72 y.o. male with past medical history of HTN, PAF, CVA, lung cancer s/p lobectomy, HLD and insomnia who presents for f/u of his chronic medical conditions.  He takes Coumadin for PAF and takes statin for HLD.  Denies any chest pain or palpitations currently.  Denies any recent change in sensation over UE or LE.   He complains of chronic intermittent dizziness, which improves with  meclizine.  He reports recent worsening of cough and has difficulty clearing mucus, but has noticed improvement in dyspnea with albuterol nebulizer treatment.  He denies any fever or chills.  He uses albuterol nebulizer for dyspnea or wheezing and uses it about 2-3 times in a day.  He was given DuoNeb, which was discontinued by pulmonology and was given trial of Yupelri.  He was not able to continue it due to its cost.  Denies any recent fever, chills or hemoptysis. He has home O2. He requires O2 as needed upon ambulation.  BPH: He reports nocturia, which has improved with Flomax 0.8 mg qHS and finasteride 5 mg QD.  Denies any dysuria or hematuria currently.  Denies weak urinary stream currently.   Past Medical History:  Diagnosis Date   A-fib [facility]) 2017   after last lung surgery patient had a history if Afib documented in hospital    Acute appendicitis 07/14/2021   Arthritis    back & legs ( knees)   Cancer (HCC)    lung   Complication of anesthesia    agitated upon waking from anesth.    Emphysema    Emphysema of lung (HCC)    Hyperlipidemia    Hypertension    OSA (obstructive sleep apnea) 11/25/2022   Oxygen dependent    2L continuous   Stroke [facility])    Tobacco abuse     Past Surgical History:  Procedure Laterality Date   APPENDECTOMY  07/15/2021   HERNIA REPAIR Bilateral  1999   umbilical   LAPAROSCOPIC APPENDECTOMY N/A 07/15/2021   Procedure: APPENDECTOMY LAPAROSCOPIC;  Surgeon: Lucretia Roers, MD;  Location: [facility];  Service: General;  Laterality: N/A;   VIDEO ASSISTED THORACOSCOPY Right 12/21/2018   Procedure: VIDEO ASSISTED THORACOSCOPY AND RESECTION OF BLEBS RIGHT LOWER LOBE;  Surgeon: Loreli Slot, MD;  Location: [facility] OR;  Service: Thoracic;  Laterality: Right;   VIDEO ASSISTED THORACOSCOPY (VATS)/ LOBECTOMY Left 08/01/2016   Procedure: VIDEO ASSISTED THORACOSCOPY (VATS)/ LEFT UPPER LOBECTOMY with lymph node sampling and onQ placement;  Surgeon: Loreli Slot, MD;  Location: [facility] OR;  Service: Thoracic;  Laterality: Left;   VIDEO ASSISTED THORACOSCOPY (VATS)/WEDGE RESECTION Right 12/09/2018   Procedure: VIDEO ASSISTED THORACOSCOPY (VATS)/WEDGE RESECTION;  Surgeon: Loreli Slot, MD;  Location: [facility] OR;  Service: Thoracic;  Laterality: Right;   VIDEO BRONCHOSCOPY Bilateral 12/21/2018   Procedure: VIDEO BRONCHOSCOPY;  Surgeon: Loreli Slot, MD;  Location: [facility] OR;  Service: Thoracic;  Laterality: Bilateral;   VIDEO BRONCHOSCOPY WITH ENDOBRONCHIAL NAVIGATION N/A 07/03/2016   Procedure: VIDEO BRONCHOSCOPY WITH ENDOBRONCHIAL NAVIGATION;  Surgeon: Loreli Slot, MD;  Location: [facility] OR;  Service: Thoracic;  Laterality: N/A;   VIDEO BRONCHOSCOPY WITH INSERTION OF INTERBRONCHIAL VALVE (IBV) N/A 12/11/2018   Procedure: VIDEO BRONCHOSCOPY WITH INSERTION OF  INTERBRONCHIAL VALVE (IBV);  Surgeon: Loreli Slot, MD;  Location: [facility] OR;  Service: Thoracic;  Laterality: N/A;   VIDEO BRONCHOSCOPY WITH INSERTION OF INTERBRONCHIAL VALVE (IBV) N/A 02/10/2019   Procedure: VIDEO BRONCHOSCOPY WITH REMOVAL OF INTERBRONCHIAL VALVE (IBV);  Surgeon: Loreli Slot, MD;  Location: [facility] OR;  Service: Thoracic;  Laterality: N/A;    Family History  Problem Relation Age of Onset   Cancer Mother    Cancer Sister    Aneurysm Sister    Kidney  disease Paternal Aunt    Asthma Brother    Cancer - Lung Brother    Cancer Brother    Arthritis Brother     Social History   Socioeconomic History   Marital status: Married    Spouse name: Not on file   Number of children: 2   Years of education: Not on file   Highest education level: 9th grade  Occupational History   Occupation: Vinyl siding   Occupation: Retired  Tobacco Use   Smoking status: Former    Current packs/day: 0.00    Average packs/day: 1 pack/day for 40.0 years (40.0 ttl pk-yrs)    Types: Cigarettes    Start date: 05/28/1976    Quit date: 05/28/2016    Years since quitting: 7.1   Smokeless tobacco: Never  Vaping Use   Vaping status: Never Used  Substance and Sexual Activity   Alcohol use: Not Currently    Comment: quit- 2016   Drug use: No   Sexual activity: Not Currently  Other Topics Concern   Not on file  Social History Narrative   Married for 20 years,second.Lives with wife. 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   Social Determinants of Health   Financial Resource Strain: Patient Declined (07/11/2023)   Overall Financial Resource Strain (CARDIA)    Difficulty of Paying Living Expenses: Patient declined  Food Insecurity: Unknown (07/11/2023)   Hunger Vital Sign    Worried About Running Out of Food in the Last Year: Never true    Ran Out of Food in the Last Year: Patient declined  Transportation Needs: No Transportation Needs (07/11/2023)   PRAPARE - Administrator, Civil Service (Medical): No    Lack of Transportation (Non-Medical): No  Physical Activity: Insufficiently Active (07/11/2023)   Exercise Vital Sign    Days of Exercise per Week: 3 days    Minutes of Exercise per Session: 10 min  Stress: Patient Declined (07/11/2023)   [employer] of Occupational Health - Occupational Stress Questionnaire    Feeling of Stress : Patient declined  Social Connections: Unknown (07/11/2023)   Social Connection and Isolation Panel  [NHANES]    Frequency of Communication with Friends and Family: Twice a week    Frequency of Social Gatherings with Friends and Family: Once a week    Attends Religious Services: Patient declined    Database administrator or Organizations: No    Attends Banker Meetings: Never    Marital Status: Married  Catering manager Violence: Not At Risk (07/31/2021)   Humiliation, Afraid, Rape, and Kick questionnaire    Fear of Current or Ex-Partner: No    Emotionally Abused: No    Physically Abused: No    Sexually Abused: No    Outpatient Medications Prior to Visit  Medication Sig Dispense Refill   acetaminophen (TYLENOL) 325 MG tablet Take 2 tablets (650 mg total) by mouth every 6 (six) hours as needed for mild pain (or Fever >/= 101).  atorvastatin (LIPITOR) 40 MG tablet Take 1 tablet (40 mg total) by mouth daily. 90 tablet 3   Cholecalciferol (VITAMIN D3) 125 MCG (5000 UT) CAPS Take 1 capsule (5,000 Units total) by mouth daily. 30 capsule 0   diltiazem (CARDIZEM CD) 180 MG 24 hr capsule Take 1 capsule (180 mg total) by mouth daily. 90 capsule 3   fenofibrate (TRICOR) 145 MG tablet Take 1 tablet (145 mg total) by mouth daily. 90 tablet 1   finasteride (PROSCAR) 5 MG tablet Take 1 tablet (5 mg total) by mouth daily. 30 tablet 3   loratadine (CLARITIN) 10 MG tablet Take 1 tablet (10 mg total) by mouth daily. 90 tablet 1   meclizine (ANTIVERT) 25 MG tablet Take 1 tablet (25 mg total) by mouth 3 (three) times daily as needed for dizziness. 30 tablet 0   metoprolol succinate (TOPROL-XL) 50 MG 24 hr tablet TAKE 1 TABLET BY MOUTH ONCE DAILY WITH MEALS 90 tablet 0   OXYGEN Inhale 2-3 L into the lungs continuous. Hx of lung ca, copd, and emphysema     polyethylene glycol (MIRALAX / GLYCOLAX) 17 g packet Take 17 g by mouth daily as needed for mild constipation. 14 each 0   tamsulosin (FLOMAX) 0.4 MG CAPS capsule Take 2 capsules (0.8 mg total) by mouth daily. 180 capsule 3   traZODone  (DESYREL) 100 MG tablet Take 1 tablet (100 mg total) by mouth at bedtime. 90 tablet 1   warfarin (COUMADIN) 5 MG tablet Take 1 tablet (5 mg total) by mouth daily. 90 tablet 1   albuterol (PROVENTIL) (2.5 MG/3ML) 0.083% nebulizer solution USE 1 VIAL IN NEBULIZER EVERY 6 HOURS AS NEEDED FOR WHEEZING OR SHORTNESS OF BREATH 180 mL 2   predniSONE (DELTASONE) 10 MG tablet Take  4 each am x 2 days,   2 each am x 2 days,  1 each am x 2 days and stop 14 tablet 0   No facility-administered medications prior to visit.    Allergies  Allergen Reactions   No Known Allergies     ROS Review of Systems  Constitutional:  Positive for fatigue. Negative for chills and fever.  HENT:  Negative for congestion, sinus pressure and sinus pain.   Eyes:  Positive for visual disturbance. Negative for redness.  Respiratory:  Positive for cough and shortness of breath.   Cardiovascular:  Negative for chest pain and palpitations.  Gastrointestinal:  Negative for diarrhea, nausea and vomiting.  Genitourinary:  Positive for frequency. Negative for dysuria and hematuria.  Musculoskeletal:  Negative for neck pain and neck stiffness.  Skin:  Negative for rash.  Neurological:  Positive for dizziness. Negative for tremors, seizures, syncope and headaches.  Psychiatric/Behavioral:  Positive for sleep disturbance. Negative for agitation and behavioral problems.       Objective:    Physical Exam Vitals reviewed.  Constitutional:      General: He is not in acute distress.    Appearance: He is not diaphoretic.  HENT:     Head: Normocephalic and atraumatic.     Nose: Nose normal.     Mouth/Throat:     Mouth: Mucous membranes are moist.  Eyes:     General: No scleral icterus.    Extraocular Movements: Extraocular movements intact.  Cardiovascular:     Rate and Rhythm: Normal rate and regular rhythm.     Pulses: Normal pulses.     Heart sounds: Normal heart sounds. No murmur heard. Pulmonary:     Breath sounds:  Wheezing (Mild, b/l) present. No rales.  Abdominal:     Palpations: Abdomen is soft.     Tenderness: There is no abdominal tenderness.  Musculoskeletal:     Cervical back: Neck supple. No tenderness.     Right lower leg: No edema.     Left lower leg: No edema.  Skin:    General: Skin is warm.     Findings: No rash.  Neurological:     General: No focal deficit present.     Mental Status: He is alert and oriented to person, place, and time. Mental status is at baseline.     Sensory: Sensory deficit (B/l feet) present.  Psychiatric:        Mood and Affect: Mood normal.        Behavior: Behavior normal.     BP 110/66 (BP Location: Right Arm, Patient Position: Sitting, Cuff Size: Normal)   Pulse 63   Ht 5\' 10"  (1.778 m)   Wt 223 lb 12.8 oz (101.5 kg)   SpO2 92%   BMI 32.11 kg/m  Wt Readings from Last 3 Encounters:  07/15/23 223 lb 12.8 oz (101.5 kg)  05/06/23 220 lb (99.8 kg)  03/04/23 221 lb 6.4 oz (100.4 kg)    Lab Results  Component Value Date   TSH 1.590 07/05/2021   Lab Results  Component Value Date   WBC 7.0 03/04/2023   HGB 15.3 03/04/2023   HCT 46.2 03/04/2023   MCV 82 03/04/2023   PLT 218 03/04/2023   Lab Results  Component Value Date   NA 140 03/04/2023   K 4.8 03/04/2023   CHLORIDE 99 04/09/2017   CO2 25 03/04/2023   GLUCOSE 81 03/04/2023   BUN 15 03/04/2023   CREATININE 1.41 (H) 03/04/2023   BILITOT 0.4 03/04/2023   ALKPHOS 45 03/04/2023   AST 22 03/04/2023   ALT 26 03/04/2023   PROT 6.9 03/04/2023   ALBUMIN 4.6 03/04/2023   CALCIUM 9.2 03/04/2023   ANIONGAP 3 (L) 06/11/2022   EGFR 53 (L) 03/04/2023   Lab Results  Component Value Date   CHOL 170 10/29/2022   Lab Results  Component Value Date   HDL 32 (L) 10/29/2022   Lab Results  Component Value Date   LDLCALC 76 10/29/2022   Lab Results  Component Value Date   TRIG 391 (H) 10/29/2022   Lab Results  Component Value Date   CHOLHDL 5.3 (H) 10/29/2022   Lab Results  Component  Value Date   HGBA1C 6.4 (H) 03/04/2023      Assessment & Plan:   Problem List Items Addressed This Visit    Diabetes mellitus (HCC) Lab Results  Component Value Date   HGBA1C 6.4 (H) 03/04/2023   Associated with HTN and HLD Well-controlled Was on Glimepiride 1 mg QD, now diet controlled Would prefer to start Metformin or SGLT2 inhibitor if HbA1C increases now On statin Needs to see Ophthalmologist for diabetic eye exam  Essential (primary) hypertension BP Readings from Last 1 Encounters:  07/15/23 110/66   Remains WNL at home Well controlled, on Metoprolol and Diltiazem (for PAF) Plan to add ARB if persistently elevated, had cough with ACEi Counseled for compliance with the medications Advised DASH diet and moderate exercise/walking, at least 150 mins/week  Aortic atherosclerosis (HCC) On statin for HLD BP well-controlled  COPD GOLD 3 using 02 prn Has acute exacerbation of COPD currently Started Medrol Dosepak Avoided antibiotic due to  interaction with Coumadin Has Albuterol neb for PRN use  Switched to [employer] for maintenance treatment as well, was not able to continue Yupelri due to cost Does not like to use inhaler due to cost concern  Benign prostatic hyperplasia with nocturia On Tamsulosin 0.8 mg QD Still had nocturia On finasteride now as well - symptoms better now  Hyperlipidemia Lipid profile reviewed On Atorvastatin and fenofibrate Would prefer to add Vascepa, but cost is a limiting factor currently  Insomnia Likely due to his sleeping habits On trazodone 100 mg nightly as needed for now Sleep hygiene discussed, material provided  PAF (paroxysmal atrial fibrillation) (HCC) On metoprolol and diltiazem for rate control On Coumadin for [facility] Followed by cardiology and [facility]     Meds ordered this encounter  Medications   methylPREDNISolone (MEDROL DOSEPAK) 4 MG TBPK tablet    Sig: Take as package instructions.    Dispense:  1 each    Refill:   0   ipratropium-albuterol (DUONEB) 0.5-2.5 (3) MG/3ML SOLN    Sig: Take 3 mLs by nebulization 4 times daily and as needed for shortness of breath or wheezing.    Dispense:  450 mL    Refill:  0    Follow-up: Return in about 4 months (around 11/15/2023) for HTN and COPD.    Anabel Halon, MD

## 2023-07-15 NOTE — Assessment & Plan Note (Signed)
On Tamsulosin 0.8 mg QD Still had nocturia On finasteride now as well - symptoms better now

## 2023-07-15 NOTE — Assessment & Plan Note (Signed)
Likely due to his sleeping habits On trazodone 100 mg nightly as needed for now Sleep hygiene discussed, material provided

## 2023-07-15 NOTE — Assessment & Plan Note (Addendum)
Has acute exacerbation of COPD currently Started Medrol Dosepak Avoided antibiotic due to  interaction with Coumadin Has Albuterol neb for PRN use Switched to [employer] for maintenance treatment as well, was not able to continue Yupelri due to cost Does not like to use inhaler due to cost concern

## 2023-07-16 ENCOUNTER — Encounter: Payer: Self-pay | Admitting: Internal Medicine

## 2023-07-16 ENCOUNTER — Other Ambulatory Visit: Payer: Self-pay | Admitting: Internal Medicine

## 2023-07-16 DIAGNOSIS — E782 Mixed hyperlipidemia: Secondary | ICD-10-CM

## 2023-07-16 LAB — CBC
Hematocrit: 48.2 % (ref 37.5–51.0)
Hemoglobin: 14.8 g/dL (ref 13.0–17.7)
MCH: 26.4 pg — ABNORMAL LOW (ref 26.6–33.0)
MCHC: 30.7 g/dL — ABNORMAL LOW (ref 31.5–35.7)
MCV: 86 fL (ref 79–97)
Platelets: 205 10*3/uL (ref 150–450)
RBC: 5.61 x10E6/uL (ref 4.14–5.80)
RDW: 13.1 % (ref 11.6–15.4)
WBC: 6.8 10*3/uL (ref 3.4–10.8)

## 2023-07-16 LAB — LIPID PANEL
Chol/HDL Ratio: 5.7 {ratio} — ABNORMAL HIGH (ref 0.0–5.0)
Cholesterol, Total: 188 mg/dL (ref 100–199)
HDL: 33 mg/dL — ABNORMAL LOW (ref >39)
LDL Chol Calc (NIH): 101 mg/dL — ABNORMAL HIGH (ref 0–99)
Triglycerides: 317 mg/dL — ABNORMAL HIGH (ref 0–149)
VLDL Cholesterol Cal: 54 mg/dL — ABNORMAL HIGH (ref 5–40)

## 2023-07-16 LAB — BASIC METABOLIC PANEL
BUN/Creatinine Ratio: 11 (ref 10–24)
BUN: 13 mg/dL (ref 8–27)
CO2: 24 mmol/L (ref 20–29)
Calcium: 9.1 mg/dL (ref 8.6–10.2)
Chloride: 101 mmol/L (ref 96–106)
Creatinine, Ser: 1.22 mg/dL (ref 0.76–1.27)
Glucose: 71 mg/dL (ref 70–99)
Potassium: 4.6 mmol/L (ref 3.5–5.2)
Sodium: 140 mmol/L (ref 134–144)
eGFR: 63 mL/min/{1.73_m2} (ref >59)

## 2023-07-16 LAB — HEMOGLOBIN A1C
Est. average glucose Bld gHb Est-mCnc: 128 mg/dL
Hgb A1c MFr Bld: 6.1 % — ABNORMAL HIGH (ref 4.8–5.6)

## 2023-07-16 LAB — MICROALBUMIN / CREATININE URINE RATIO
Creatinine, Urine: 67.4 mg/dL
Microalb/Creat Ratio: <4 mg/g{creat} (ref 0–29)
Microalbumin, Urine: <3 ug/mL

## 2023-07-16 MED ORDER — ATORVASTATIN CALCIUM 80 MG PO TABS
80.0000 mg | ORAL_TABLET | Freq: Every day | ORAL | 3 refills | Status: DC
Start: 2023-07-16 — End: 2023-09-03

## 2023-07-22 ENCOUNTER — Telehealth: Payer: Self-pay | Admitting: Medical Oncology

## 2023-07-22 NOTE — Telephone Encounter (Signed)
CT scan-"can Dr Arbutus Ped see results from [facility]?"

## 2023-07-31 ENCOUNTER — Inpatient Hospital Stay: Payer: Medicare Other | Attending: Internal Medicine | Admitting: Internal Medicine

## 2023-07-31 DIAGNOSIS — C3490 Malignant neoplasm of unspecified part of unspecified bronchus or lung: Secondary | ICD-10-CM | POA: Diagnosis not present

## 2023-07-31 NOTE — Progress Notes (Signed)
[facility] [phone]   [phone]  PROGRESS NOTE FOR TELEMEDICINE VISITS  Anabel Halon, MD [address] [place] [address]  I connected withNAME@ on 07/31/23 at  3:45 PM EDT by telephone visit and verified that I am speaking with the correct person using two identifiers.   I discussed the limitations, risks, security and privacy concerns of performing an evaluation and management service by telemedicine and the availability of in-person appointments. I also discussed with the patient that there may be a patient responsible charge related to this service. The patient expressed understanding and agreed to proceed.  Other persons participating in the visit and their role in the encounter:  wife  Patient's location:  Home Provider's location:   Cancer Center  DIAGNOSIS:  1) Stage IA (T1b, N0, M0) non-small cell lung cancer, well-differentiated adenocarcinoma presented with left upper lobeS pulmonary nodule diagnosed in September 2017. 2) stage Ia non-small cell lung cancer diagnosed in March 2020.   PRIOR THERAPY:  1) Status post left VATS, left upper lobectomy with mediastinal lymph node dissection under the care of Dr. Dorris Fetch on 08/01/2016. 2) status post right VATS with wedge resection of right upper lobe nodule and bleb resection under the care of Dr. Dorris Fetch on December 10, 2018.   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  INTERVAL HISTORY: Randall Sullivan 72 y.o. male has a telephone virtual visit with me today for evaluation and discussion of his recent scan results.Discussed the use of AI scribe software for clinical note transcription with the patient, who gave verbal consent to proceed.  History of Present Illness   Randall Sullivan, a 72 year old individual with  a history of stage 1A non-small cell lung cancer (adenocarcinoma), initially diagnosed in September 2017, underwent an upper lobectomy. A second lobectomy was performed in March 2020. Since then, he has been under regular surveillance with annual scans.  Recently, he has been experiencing discomfort on the right side of his chest, which he attributes to a stroke he suffered in June 2022, which affected his right side. He also reports a change in his breathing pattern, which he believes is weather-related. He has been using Duoneb and completed a course of prednisone to manage this.  His most recent scan in October 2024 showed a soft tissue mass in the right upper lobe and a speculated nodule in the right lower lobe, raising concerns about possible disease recurrence. He reports no other symptoms such as nausea, vomiting, diarrhea, or headaches. However, he does mention a change in peripheral vision, which occurred post-stroke.       MEDICAL HISTORY: Past Medical History:  Diagnosis Date   A-fib [facility]) 2017   after last lung surgery patient had a history if Afib documented in hospital    Acute appendicitis 07/14/2021   Arthritis    back & legs ( knees)   Cancer (HCC)    lung   Complication of anesthesia    agitated upon waking from anesth.    Emphysema    Emphysema of lung (HCC)    Hyperlipidemia    Hypertension    OSA (obstructive sleep apnea) 11/25/2022   Oxygen dependent    2L continuous   Stroke (HCC)    Tobacco abuse     ALLERGIES:  is allergic to no known allergies.  MEDICATIONS:  Current  Outpatient Medications  Medication Sig Dispense Refill   acetaminophen (TYLENOL) 325 MG tablet Take 2 tablets (650 mg total) by mouth every 6 (six) hours as needed for mild pain (or Fever >/= 101).     atorvastatin (LIPITOR) 80 MG tablet Take 1 tablet (80 mg total) by mouth daily. 90 tablet 3   Cholecalciferol (VITAMIN D3) 125 MCG (5000 UT) CAPS Take 1 capsule (5,000 Units total) by mouth  daily. 30 capsule 0   diltiazem (CARDIZEM CD) 180 MG 24 hr capsule Take 1 capsule (180 mg total) by mouth daily. 90 capsule 3   fenofibrate (TRICOR) 145 MG tablet Take 1 tablet (145 mg total) by mouth daily. 90 tablet 1   finasteride (PROSCAR) 5 MG tablet Take 1 tablet (5 mg total) by mouth daily. 30 tablet 3   ipratropium-albuterol (DUONEB) 0.5-2.5 (3) MG/3ML SOLN Take 3 mLs by nebulization 4 times daily and as needed for shortness of breath or wheezing. 450 mL 0   loratadine (CLARITIN) 10 MG tablet Take 1 tablet (10 mg total) by mouth daily. 90 tablet 1   meclizine (ANTIVERT) 25 MG tablet Take 1 tablet (25 mg total) by mouth 3 (three) times daily as needed for dizziness. 30 tablet 0   methylPREDNISolone (MEDROL DOSEPAK) 4 MG TBPK tablet Take as package instructions. 1 each 0   metoprolol succinate (TOPROL-XL) 50 MG 24 hr tablet TAKE 1 TABLET BY MOUTH ONCE DAILY WITH MEALS 90 tablet 0   OXYGEN Inhale 2-3 L into the lungs continuous. Hx of lung ca, copd, and emphysema     polyethylene glycol (MIRALAX / GLYCOLAX) 17 g packet Take 17 g by mouth daily as needed for mild constipation. 14 each 0   tamsulosin (FLOMAX) 0.4 MG CAPS capsule Take 2 capsules (0.8 mg total) by mouth daily. 180 capsule 3   traZODone (DESYREL) 100 MG tablet Take 1 tablet (100 mg total) by mouth at bedtime. 90 tablet 1   warfarin (COUMADIN) 5 MG tablet Take 1 tablet (5 mg total) by mouth daily. 90 tablet 1   No current facility-administered medications for this visit.    SURGICAL HISTORY:  Past Surgical History:  Procedure Laterality Date   APPENDECTOMY  07/15/2021   HERNIA REPAIR Bilateral 1999   umbilical   LAPAROSCOPIC APPENDECTOMY N/A 07/15/2021   Procedure: APPENDECTOMY LAPAROSCOPIC;  Surgeon: Lucretia Roers, MD;  Location: [facility];  Service: General;  Laterality: N/A;   VIDEO ASSISTED THORACOSCOPY Right 12/21/2018   Procedure: VIDEO ASSISTED THORACOSCOPY AND RESECTION OF BLEBS RIGHT LOWER LOBE;  Surgeon:  Loreli Slot, MD;  Location: [facility];  Service: Thoracic;  Laterality: Right;   VIDEO ASSISTED THORACOSCOPY (VATS)/ LOBECTOMY Left 08/01/2016   Procedure: VIDEO ASSISTED THORACOSCOPY (VATS)/ LEFT UPPER LOBECTOMY with lymph node sampling and onQ placement;  Surgeon: Loreli Slot, MD;  Location: [facility];  Service: Thoracic;  Laterality: Left;   VIDEO ASSISTED THORACOSCOPY (VATS)/WEDGE RESECTION Right 12/09/2018   Procedure: VIDEO ASSISTED THORACOSCOPY (VATS)/WEDGE RESECTION;  Surgeon: Loreli Slot, MD;  Location: [facility];  Service: Thoracic;  Laterality: Right;   VIDEO BRONCHOSCOPY Bilateral 12/21/2018   Procedure: VIDEO BRONCHOSCOPY;  Surgeon: Loreli Slot, MD;  Location: [facility];  Service: Thoracic;  Laterality: Bilateral;   VIDEO BRONCHOSCOPY WITH ENDOBRONCHIAL NAVIGATION N/A 07/03/2016   Procedure: VIDEO BRONCHOSCOPY WITH ENDOBRONCHIAL NAVIGATION;  Surgeon: Loreli Slot, MD;  Location: [facility];  Service: Thoracic;  Laterality: N/A;   VIDEO BRONCHOSCOPY WITH INSERTION OF INTERBRONCHIAL VALVE (IBV) N/A 12/11/2018  Procedure: VIDEO BRONCHOSCOPY WITH INSERTION OF INTERBRONCHIAL VALVE (IBV);  Surgeon: Loreli Slot, MD;  Location: [facility] OR;  Service: Thoracic;  Laterality: N/A;   VIDEO BRONCHOSCOPY WITH INSERTION OF INTERBRONCHIAL VALVE (IBV) N/A 02/10/2019   Procedure: VIDEO BRONCHOSCOPY WITH REMOVAL OF INTERBRONCHIAL VALVE (IBV);  Surgeon: Loreli Slot, MD;  Location: [facility] OR;  Service: Thoracic;  Laterality: N/A;    REVIEW OF SYSTEMS:  A comprehensive review of systems was negative except for: Constitutional: positive for fatigue Respiratory: positive for cough and dyspnea on exertion     LABORATORY DATA: Lab Results  Component Value Date   WBC 6.8 07/15/2023   HGB 14.8 07/15/2023   HCT 48.2 07/15/2023   MCV 86 07/15/2023   PLT 205 07/15/2023      Chemistry      Component Value Date/Time   NA 140 07/15/2023 1142   NA 136 04/09/2017 1249    K 4.6 07/15/2023 1142   K 4.6 04/09/2017 1249   CL 101 07/15/2023 1142   CO2 24 07/15/2023 1142   CO2 27 04/09/2017 1249   BUN 13 07/15/2023 1142   BUN 23.9 04/09/2017 1249   CREATININE 1.22 07/15/2023 1142   CREATININE 1.25 (H) 06/11/2022 1132   CREATININE 1.21 (H) 05/08/2020 1051   CREATININE 1.4 (H) 04/09/2017 1249      Component Value Date/Time   CALCIUM 9.1 07/15/2023 1142   CALCIUM 9.6 04/09/2017 1249   ALKPHOS 45 03/04/2023 1515   ALKPHOS 58 04/09/2017 1249   AST 22 03/04/2023 1515   AST 23 06/11/2022 1132   AST 13 04/09/2017 1249   ALT 26 03/04/2023 1515   ALT 27 06/11/2022 1132   ALT 9 04/09/2017 1249   BILITOT 0.4 03/04/2023 1515   BILITOT 0.5 06/11/2022 1132   BILITOT 1.01 04/09/2017 1249       RADIOGRAPHIC STUDIES: CT CHEST W CONTRAST  Result Date: 07/18/2023 CLINICAL DATA:  Non-small cell lung cancer.  * Tracking Code: BO * EXAM: CT CHEST WITH CONTRAST TECHNIQUE: Multidetector CT imaging of the chest was performed during intravenous contrast administration. RADIATION DOSE REDUCTION: This exam was performed according to the departmental dose-optimization program which includes automated exposure control, adjustment of the mA and/or kV according to patient size and/or use of iterative reconstruction technique. CONTRAST:  75mL OMNIPAQUE IOHEXOL 300 MG/ML  SOLN COMPARISON:  06/11/2022. FINDINGS: Cardiovascular: Atherosclerotic calcification of the aorta and coronary arteries. Descending thoracic aorta measures up to approximately 3.7 cm. Heart is at the upper limits of normal in size to mildly enlarged. Small amount of pericardial fluid, similar. Mediastinum/Nodes: No pathologically enlarged mediastinal, hilar or axillary lymph nodes. Air in a dilated esophagus can be seen with dysmotility. Lungs/Pleura: Centrilobular and paraseptal emphysema. Increased masslike soft tissue along a suture line in the perihilar right upper lobe, measuring 2.4 x 3.6 cm (3/46), previously  1.3 x 2.8 cm on 06/11/2022. 6 mm nodule in the superior segment right lower lobe (3/55), stable. Small nodules in the lateral right lower lobe measure up to 7 mm (3/100), previously 5 mm on 06/11/2022. Left upper lobectomy. Scattered scarring in the left lung. No pleural fluid. Minimal debris in the airway. Upper Abdomen: Liver is decreased in attenuation diffusely. Visualized portions of the liver and right adrenal gland are unremarkable. 1.2 cm fat density nodule in the left adrenal gland measures -109 Hounsfield units. No follow-up necessary. Small low-attenuation lesions in the kidneys. No specific follow-up necessary. Visualized portions of the spleen, pancreas, stomach and bowel are grossly  unremarkable. Borderline enlarged periportal lymph nodes, likely reactive. Musculoskeletal: Degenerative changes in the spine. Slight compression of the L2 superior endplate, likely old. No worrisome lytic or sclerotic lesions. IMPRESSION: 1. Increasing masslike soft tissue along the suture margin in the central right upper lobe, indicative of disease recurrence. 2. Slightly enlarged spiculated nodule in the lateral right lower lobe. Additional nodules are stable. Recommend continued attention on follow-up. 3. Hepatic steatosis. 4. Left adrenal myelolipoma. 5. Aortic atherosclerosis (ICD10-I70.0). Coronary artery calcification. 6.  Emphysema (ICD10-J43.9). Electronically Signed   By: Leanna Battles M.D.   On: 07/18/2023 14:05    ASSESSMENT AND PLAN: This is a very pleasant 72 years old white male with a stage IA non-small cell lung cancer status post left lower lobectomy with lymph node dissection under the care of Dr. Dorris Fetch in November 2017 and has been observation since that time. He was also diagnosed with stage IA non-small cell lung cancer in March 2020 and the patient underwent wedge resection of the upper lobe in March 2020.  The patient is currently on observation. He had repeat CT scan of the chest  performed recently.  I personally and independently reviewed the scan and discussed the result with the patient and his wife today. His scan showed increasing masslike soft tissue along the suture margin in the central right upper lobe suspicious for disease recurrence with a slightly enlarging spiculated nodule in the lateral right lower lobe.    Non-small cell lung cancer, adenocarcinoma History of upper lobectomy in 2017 and 2020. Recent CT scan shows a soft tissue mass in the right upper lobe and a speculated nodule in the right lower lobe, suspicious for disease recurrence. -Order PET scan within the next two weeks to further evaluate these findings. -Schedule follow-up visit in approximately three weeks to discuss PET scan results.  Stroke Occurred in June 2022, affecting the right side. Complaints of  chest pain on the right side, possibly related to stroke. -No specific plan discussed in the conversation.  Respiratory symptoms Increased shortness of breath, possibly related to weather changes. Currently on Duoneb and recently completed a course of prednisone. -No specific plan discussed in the conversation.  The patient was advised to call immediately if he has any concerning symptoms in the interval.   I discussed the assessment and treatment plan with the patient. The patient was provided an opportunity to ask questions and all were answered. The patient agreed with the plan and demonstrated an understanding of the instructions.   The patient was advised to call back or seek an in-person evaluation if the symptoms worsen or if the condition fails to improve as anticipated.  I provided 15 minutes of non face-to-face telephone visit time during this encounter, and > 50% was spent counseling as documented under my assessment & plan.  Lajuana Matte, MD 07/31/2023 3:33 PM  Disclaimer: This note was dictated with voice recognition software. Similar sounding words can inadvertently be  transcribed and may not be corrected upon review.

## 2023-08-02 ENCOUNTER — Telehealth: Payer: Self-pay | Admitting: Internal Medicine

## 2023-08-02 NOTE — Telephone Encounter (Signed)
Per Gwenyth Bouillon 10/31 los left patient a message in regards to scheduled appointment times/dates

## 2023-08-07 ENCOUNTER — Encounter: Payer: Self-pay | Admitting: Internal Medicine

## 2023-08-12 ENCOUNTER — Ambulatory Visit: Payer: Medicare Other | Attending: Internal Medicine | Admitting: *Deleted

## 2023-08-12 DIAGNOSIS — Z5181 Encounter for therapeutic drug level monitoring: Secondary | ICD-10-CM | POA: Diagnosis not present

## 2023-08-12 DIAGNOSIS — I480 Paroxysmal atrial fibrillation: Secondary | ICD-10-CM | POA: Insufficient documentation

## 2023-08-12 LAB — POCT INR: INR: 2.5 (ref 2.0–3.0)

## 2023-08-12 NOTE — Patient Instructions (Signed)
Continue warfarin 1 tablet daily except 1/2 tablet on Tuesdays and Fridays ?Recheck INR in 6 weeks.  ?

## 2023-08-15 ENCOUNTER — Encounter: Payer: Self-pay | Admitting: Internal Medicine

## 2023-08-15 ENCOUNTER — Other Ambulatory Visit: Payer: Self-pay | Admitting: Internal Medicine

## 2023-08-15 DIAGNOSIS — J449 Chronic obstructive pulmonary disease, unspecified: Secondary | ICD-10-CM

## 2023-08-15 NOTE — Telephone Encounter (Signed)
Copied from CRM [id number]. Topic: Clinical - Medication Refill >> Aug 15, 2023  3:47 PM Dimitri Ped wrote: Most Recent Primary Care Visit:  Provider: Anabel Halon  Department: [facility]  Visit Type: OFFICE VISIT  Date: 07/15/2023  Medication: ipratropium-albuterol (DUONEB) 0.5-2.5 (3) MG/3ML SOLN  Has the patient contacted their pharmacy? Yes (they were going to fax it over to clinic) (Agent: If no, request that the patient contact the pharmacy for the refill. If patient does not wish to contact the pharmacy document the reason why and proceed with request.) (Agent: If yes, when and what did the pharmacy advise?)  Is this the correct pharmacy for this prescription?  If no, delete pharmacy and type the correct one.  This is the patient's preferred pharmacy:  [facility] [address] [place] - [address] [place] [address] Phone: [phone] Fax: [phone]  [id number] - [place], [place] - [address] [place] [address] Phone: [phone] Fax: [phone]  [facility] - [place], [place] - [address] [facility] [employer] [address] [place] [address] Phone: [phone] Fax: [phone]  [facility] [place], [place] - [address] [place] [address] Phone: [phone] Fax: [phone]   Has the prescription been filled recently?   Is the patient out of the medication?   Has the patient been seen for an appointment in the last year OR does the patient have an upcoming appointment?   Can we respond through MyChart?   Agent: Please be advised that Rx refills may take up to 3 business days. We ask that you follow-up with your pharmacy.

## 2023-08-18 ENCOUNTER — Telehealth: Payer: Self-pay | Admitting: Internal Medicine

## 2023-08-18 MED ORDER — IPRATROPIUM-ALBUTEROL 0.5-2.5 (3) MG/3ML IN SOLN: RESPIRATORY_TRACT | 0 refills | Status: DC

## 2023-08-18 NOTE — Telephone Encounter (Signed)
PATIENT WIFE IS CALLING BACK IN SOMEONE FROM THE OFFICE CALLED IN  AND THERE WHERE NO NOTES ON THE ACCOUNT PATIENT WIFE IS CALLING BACK IN SHE WOULD LIKE A CALL BACK REGARDING THIS MATTER

## 2023-08-18 NOTE — Telephone Encounter (Signed)
No one called patient or patient wife

## 2023-08-19 ENCOUNTER — Encounter: Payer: Self-pay | Admitting: *Deleted

## 2023-08-19 ENCOUNTER — Encounter (HOSPITAL_COMMUNITY)
Admission: RE | Admit: 2023-08-19 | Discharge: 2023-08-19 | Disposition: A | Discharge status: Home / Self Care | Payer: Medicare Other | Source: Ambulatory Visit | Attending: Internal Medicine | Admitting: Internal Medicine

## 2023-08-19 DIAGNOSIS — K760 Fatty (change of) liver, not elsewhere classified: Secondary | ICD-10-CM | POA: Insufficient documentation

## 2023-08-19 DIAGNOSIS — C3490 Malignant neoplasm of unspecified part of unspecified bronchus or lung: Secondary | ICD-10-CM | POA: Diagnosis not present

## 2023-08-19 DIAGNOSIS — D1779 Benign lipomatous neoplasm of other sites: Secondary | ICD-10-CM | POA: Diagnosis not present

## 2023-08-19 DIAGNOSIS — I2510 Atherosclerotic heart disease of native coronary artery without angina pectoris: Secondary | ICD-10-CM | POA: Diagnosis not present

## 2023-08-19 DIAGNOSIS — N200 Calculus of kidney: Secondary | ICD-10-CM | POA: Insufficient documentation

## 2023-08-19 DIAGNOSIS — C3411 Malignant neoplasm of upper lobe, right bronchus or lung: Secondary | ICD-10-CM | POA: Diagnosis not present

## 2023-08-19 DIAGNOSIS — I700 Atherosclerosis of aorta: Secondary | ICD-10-CM | POA: Insufficient documentation

## 2023-08-19 DIAGNOSIS — J439 Emphysema, unspecified: Secondary | ICD-10-CM | POA: Diagnosis not present

## 2023-08-19 LAB — GLUCOSE, CAPILLARY: Glucose-Capillary: 95 mg/dL (ref 70–99)

## 2023-08-19 MED ORDER — FLUDEOXYGLUCOSE F - 18 (FDG) INJECTION
11.1000 | Freq: Once | INTRAVENOUS | Status: AC | PRN
  Administered 2023-08-19: 11.1 via INTRAVENOUS

## 2023-08-20 ENCOUNTER — Encounter: Payer: Self-pay | Admitting: *Deleted

## 2023-08-25 ENCOUNTER — Encounter: Payer: Self-pay | Admitting: *Deleted

## 2023-08-31 NOTE — Progress Notes (Unsigned)
Cardiology Office Note:   Date:  09/03/2023  ID:  Randall Sullivan, DOB Apr 02, 1951, MRN [id number] PCP: Anabel Halon, MD  [facility] Providers Cardiologist:  None {  History of Present Illness:   Randall Sullivan is a 72 y.o. male  who presents for evaluation of coronary calcium . I saw him in 2017.  He had resection of lung cancer then as well and had some atrial fibrillation post surgery.  He had had resection of another lung cancer this time on the other side (right) in 2022.  He had a VATS for blebectomy and wedge resection of the right upper lobe nodule.  This was a non-small cell lung cancer.  He had a large air leak postoperatively and had to have redo VATS.  He reports being in atrial fibrillation transiently but I do not see a discharge summary that corroborates this.   Previously a POET (Plain Old Exercise Treadmill) was negative for ischemia.  He was in the hospital with CVA in October 2022.  CT/MRI/MRI of the head showed acute left large PCA territory infarction.  He had left PCA occlusion at the P1 P2 junction.  Small acute right thalamic infarction.  No hemorrhage or mass-effect.  He did not receive tPA.  Carotid Dopplers demonstrated no ICA stenosis.  Echocardiogram demonstrated an ejection fraction of 50 to 55% grade 1 diastolic dysfunction.      Today he saw his oncologist and it looks like a CT is demonstrating soft tissue mass in the suture line of the right upper lobe which is likely recurrent malignancy.  He is probably going to have a biopsy of this.  PET scan result is pending.  He is not thinking that he is going to have resection but might have radiation.  He has chronic fatigue.  He does have some left arm pain but this is nonanginal sounding and that it is localized in was not associated with other symptoms.  He has some chronic shortness of breath but is not having any new PND or orthopnea.  He is not having any new palpitations, presyncope or syncope.  He does have  residual right-sided weakness and some memory problems from his previous stroke.  He gets around with a cane.   ROS:  As stated in the HPI and negative for all other systems.  Studies Reviewed:    EKG:   EKG Interpretation Date/Time:  Wednesday September 03 2023 13:31:01 EST Ventricular Rate:  70 PR Interval:  144 QRS Duration:  88 QT Interval:  392 QTC Calculation: 423 R Axis:   86  Text Interpretation: Normal sinus rhythm Normal ECG When compared with ECG of 29-Mar-2022 05:31, No significant change since last tracing Confirmed by Rollene Rotunda ([id number]) on 09/03/2023 1:52:44 PM    Risk Assessment/Calculations:              Physical Exam:   VS:  BP 102/70   Pulse 70   Ht 5\' 10"  (1.778 m)   Wt 221 lb (100.2 kg)   BMI 31.71 kg/m    Wt Readings from Last 3 Encounters:  09/03/23 221 lb (100.2 kg)  07/15/23 223 lb 12.8 oz (101.5 kg)  05/06/23 220 lb (99.8 kg)     GEN: Well nourished, well developed in no acute distress NECK: No JVD; No carotid bruits CARDIAC: RRR, no murmurs, rubs, gallops RESPIRATORY:  Clear to auscultation without rales, wheezing or rhonchi  ABDOMEN: Soft, non-tender, non-distended EXTREMITIES:  No edema; No deformity  ASSESSMENT AND PLAN:   ELEVATED CORONARY CALCIUM:    He does have some left arm discomfort and chronic dyspnea of the sounds not anginal.  I would not plan stress testing unless he has to have an open surgical procedure which point I think he would need a PET scan for perfusion imaging prior.  Also if he has any increasing symptoms such as left arm discomfort not otherwise explained I might screen him.  He needs continued primary risk reduction.    HTN: His blood pressure is at target.  No change in therapy.  DYSLIPIDEMIA: His LDL was not at target with an LDL of 101.  I would like to change to Crestor 40 mg daily.  He can get a repeat lipid 3 months after starting that.   ATRIAL FIB:   He has had no symptomatic recurrence.  He prefers  to stay on warfarin.  He has had no symptomatic recurrence.  He continues on warfarin.    AAA:   4.0 cm in Dec 2023 and he will need repeat this year.  I will arrange follow-up abdominal ultrasound.   PREOP: If he has lung biopsy he can hold his warfarin for 5 days prior or as directed by Dr. Dorris Fetch.     Follow up with me in 12 months.   Signed, Rollene Rotunda, MD

## 2023-09-03 ENCOUNTER — Ambulatory Visit (INDEPENDENT_AMBULATORY_CARE_PROVIDER_SITE_OTHER): Payer: Medicare Other | Admitting: Cardiology

## 2023-09-03 ENCOUNTER — Other Ambulatory Visit: Payer: Self-pay | Admitting: *Deleted

## 2023-09-03 ENCOUNTER — Inpatient Hospital Stay: Payer: Medicare Other | Attending: Internal Medicine | Admitting: Internal Medicine

## 2023-09-03 ENCOUNTER — Encounter: Payer: Self-pay | Admitting: Cardiology

## 2023-09-03 VITALS — BP 102/70 | HR 70 | Ht 70.0 in | Wt 221.0 lb

## 2023-09-03 DIAGNOSIS — C3491 Malignant neoplasm of unspecified part of right bronchus or lung: Secondary | ICD-10-CM

## 2023-09-03 DIAGNOSIS — E785 Hyperlipidemia, unspecified: Secondary | ICD-10-CM

## 2023-09-03 DIAGNOSIS — I714 Abdominal aortic aneurysm, without rupture, unspecified: Secondary | ICD-10-CM

## 2023-09-03 DIAGNOSIS — R931 Abnormal findings on diagnostic imaging of heart and coronary circulation: Secondary | ICD-10-CM

## 2023-09-03 DIAGNOSIS — Z79899 Other long term (current) drug therapy: Secondary | ICD-10-CM | POA: Diagnosis not present

## 2023-09-03 DIAGNOSIS — I480 Paroxysmal atrial fibrillation: Secondary | ICD-10-CM

## 2023-09-03 MED ORDER — ROSUVASTATIN CALCIUM 40 MG PO TABS: 40.0000 mg | ORAL_TABLET | Freq: Every day | ORAL | 3 refills | Status: AC

## 2023-09-03 NOTE — Patient Instructions (Signed)
Medication Instructions:  Please discontinue your Atorvastatin and start Crestor 40 mg a day. Continue all other medications as listed.  *If you need a refill on your cardiac medications before your next appointment, please call your pharmacy*   Lab Work: Please have blood work in 6 months - Lipid panel at your closest [place].  If you have labs (blood work) drawn today and your tests are completely normal, you will receive your results only by: MyChart Message (if you have MyChart) OR A paper copy in the mail If you have any lab test that is abnormal or we need to change your treatment, we will call you to review the results.   Testing/Procedures: Your physician has requested that you have an abdominal aorta duplex. During this test, an ultrasound is used to evaluate the aorta. Allow 30 minutes for this exam. Do not eat after midnight the day before and avoid carbonated beverages.  Please note: We ask at that you not bring children with you during ultrasound (echo/ vascular) testing. Due to room size and safety concerns, children are not allowed in the ultrasound rooms during exams. Our front office staff cannot provide observation of children in our lobby area while testing is being conducted. An adult accompanying a patient to their appointment will only be allowed in the ultrasound room at the discretion of the ultrasound technician under special circumstances. We apologize for any inconvenience. This will be completed at [facility] and you will be contacted to be scheduled.   Follow-Up: At [facility], you and your health needs are our priority.  As part of our continuing mission to provide you with exceptional heart care, we have created designated Provider Care Teams.  These Care Teams include your primary Cardiologist (physician) and Advanced Practice Providers (APPs -  Physician Assistants and Nurse Practitioners) who all work together to provide you with the care you need,  when you need it.  We recommend signing up for the patient portal called "MyChart".  Sign up information is provided on this After Visit Summary.  MyChart is used to connect with patients for Virtual Visits (Telemedicine).  Patients are able to view lab/test results, encounter notes, upcoming appointments, etc.  Non-urgent messages can be sent to your provider as well.   To learn more about what you can do with MyChart, go to [email or web address].    Your next appointment:   1 year(s)  Provider:   Rollene Rotunda, MD

## 2023-09-03 NOTE — Progress Notes (Signed)
[facility] [phone]   Fax:([phone]) [phone]  PROGRESS NOTE FOR TELEMEDICINE VISITS  Anabel Halon, MD [address] [place] [address]  I connected withNAME@ on 09/03/23 at 11:00 AM EST by telephone visit and verified that I am speaking with the correct person using two identifiers.   I discussed the limitations, risks, security and privacy concerns of performing an evaluation and management service by telemedicine and the availability of in-person appointments. I also discussed with the patient that there may be a patient responsible charge related to this service. The patient expressed understanding and agreed to proceed.  Other persons participating in the visit and their role in the encounter:  Wife  Patient's location:  Home Provider's location:  [facility]  DIAGNOSIS:  1) Stage IA (T1b, N0, M0) non-small cell lung cancer, well-differentiated adenocarcinoma presented with left upper lobeS pulmonary nodule diagnosed in September 2017. 2) stage Ia non-small cell lung cancer diagnosed in March 2020.   PRIOR THERAPY:  1) Status post left VATS, left upper lobectomy with mediastinal lymph node dissection under the care of Dr. Dorris Fetch on 08/01/2016. 2) status post right VATS with wedge resection of right upper lobe nodule and bleb resection under the care of Dr. Dorris Fetch on December 10, 2018.   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  INTERVAL HISTORY: Randall Sullivan 72 y.o. male has a telephone virtual visit with me today for evaluation and discussion of his recent PET scan.Discussed the use of AI scribe software for clinical note transcription with the patient, who gave verbal consent to proceed.  History of Present Illness   The patient, a 72 year old with a history of  non-small cell lung cancer, initially diagnosed in November 2017, underwent a left lower lobectomy. In March 2020, he was diagnosed with another cancer and had a wedge resection of the upper lobe. Since then, he has been under observation.  A recent CT scan in October showed an increasing soft tissue mass in the suture line of the right upper lobe and an enlarged nodule in the lateral right lower lobe. A subsequent PET scan was performed in November, the results of which are pending.  The patient reports feeling generally well, with some recent increase in shortness of breath and coughing. He also reports new pain in the left upper arm, with no history of trauma.  The patient has a history of stroke, which has limited his physical capabilities. He has previously undergone bronchoscopy for biopsy and is open to the possibility of another bronchoscopy for re-biopsy of the new lung mass.      MEDICAL HISTORY: Past Medical History:  Diagnosis Date   A-fib [facility]) 2017   after last lung surgery patient had a history if Afib documented in hospital    Acute appendicitis 07/14/2021   Arthritis    back & legs ( knees)   Cancer (HCC)    lung   Complication of anesthesia    agitated upon waking from anesth.    Emphysema    Emphysema of lung (HCC)    Hyperlipidemia    Hypertension    OSA (obstructive sleep apnea) 11/25/2022   Oxygen dependent    2L continuous   Stroke (HCC)    Tobacco abuse     ALLERGIES:  is allergic to no known allergies.  MEDICATIONS:  Current Outpatient Medications  Medication Sig Dispense Refill   acetaminophen (TYLENOL) 325 MG tablet Take 2 tablets (650 mg total) by mouth every 6 (six) hours as needed for mild pain (or Fever >/= 101).     atorvastatin (LIPITOR) 80 MG tablet Take 1 tablet (80 mg total) by mouth daily. 90 tablet 3   Cholecalciferol (VITAMIN D3) 125 MCG (5000 UT) CAPS Take 1 capsule (5,000 Units total) by mouth daily. 30 capsule 0   diltiazem (CARDIZEM  CD) 180 MG 24 hr capsule Take 1 capsule (180 mg total) by mouth daily. 90 capsule 3   fenofibrate (TRICOR) 145 MG tablet Take 1 tablet (145 mg total) by mouth daily. 90 tablet 1   finasteride (PROSCAR) 5 MG tablet Take 1 tablet (5 mg total) by mouth daily. 30 tablet 3   ipratropium-albuterol (DUONEB) 0.5-2.5 (3) MG/3ML SOLN USE 1 AMPULE IN NEBULIZER 4 TIMES DAILY AND AS NEEDED FOR SHORTNESS OF BREATH AND FOR WHEEZING 450 mL 0   loratadine (CLARITIN) 10 MG tablet Take 1 tablet (10 mg total) by mouth daily. 90 tablet 1   meclizine (ANTIVERT) 25 MG tablet Take 1 tablet (25 mg total) by mouth 3 (three) times daily as needed for dizziness. 30 tablet 0   methylPREDNISolone (MEDROL DOSEPAK) 4 MG TBPK tablet Take as package instructions. 1 each 0   metoprolol succinate (TOPROL-XL) 50 MG 24 hr tablet TAKE 1 TABLET BY MOUTH ONCE DAILY WITH MEALS 90 tablet 0   OXYGEN Inhale 2-3 L into the lungs continuous. Hx of lung ca, copd, and emphysema     polyethylene glycol (MIRALAX / GLYCOLAX) 17 g packet Take 17 g by mouth daily as needed for mild constipation. 14 each 0   tamsulosin (FLOMAX) 0.4 MG CAPS capsule Take 2 capsules (0.8 mg total) by mouth daily. 180 capsule 3   traZODone (DESYREL) 100 MG tablet Take 1 tablet (100 mg total) by mouth at bedtime. 90 tablet 1   warfarin (COUMADIN) 5 MG tablet Take 1 tablet (5 mg total) by mouth daily. 90 tablet 1   No current facility-administered medications for this visit.    SURGICAL HISTORY:  Past Surgical History:  Procedure Laterality Date   APPENDECTOMY  07/15/2021   HERNIA REPAIR Bilateral 1999   umbilical   LAPAROSCOPIC APPENDECTOMY N/A 07/15/2021   Procedure: APPENDECTOMY LAPAROSCOPIC;  Surgeon: Lucretia Roers, MD;  Location: [facility];  Service: General;  Laterality: N/A;   VIDEO ASSISTED THORACOSCOPY Right 12/21/2018   Procedure: VIDEO ASSISTED THORACOSCOPY AND RESECTION OF BLEBS RIGHT LOWER LOBE;  Surgeon: Loreli Slot, MD;  Location: [facility] OR;   Service: Thoracic;  Laterality: Right;   VIDEO ASSISTED THORACOSCOPY (VATS)/ LOBECTOMY Left 08/01/2016   Procedure: VIDEO ASSISTED THORACOSCOPY (VATS)/ LEFT UPPER LOBECTOMY with lymph node sampling and onQ placement;  Surgeon: Loreli Slot, MD;  Location: [facility];  Service: Thoracic;  Laterality: Left;   VIDEO ASSISTED THORACOSCOPY (VATS)/WEDGE RESECTION Right 12/09/2018   Procedure: VIDEO ASSISTED THORACOSCOPY (VATS)/WEDGE RESECTION;  Surgeon: Loreli Slot, MD;  Location: [facility];  Service: Thoracic;  Laterality: Right;   VIDEO BRONCHOSCOPY Bilateral 12/21/2018   Procedure: VIDEO BRONCHOSCOPY;  Surgeon: Loreli Slot, MD;  Location: [facility] OR;  Service: Thoracic;  Laterality: Bilateral;   VIDEO BRONCHOSCOPY WITH ENDOBRONCHIAL NAVIGATION N/A 07/03/2016   Procedure: VIDEO BRONCHOSCOPY WITH ENDOBRONCHIAL NAVIGATION;  Surgeon: Loreli Slot, MD;  Location: [facility];  Service: Thoracic;  Laterality: N/A;   VIDEO BRONCHOSCOPY WITH INSERTION OF INTERBRONCHIAL VALVE (IBV) N/A  12/11/2018   Procedure: VIDEO BRONCHOSCOPY WITH INSERTION OF INTERBRONCHIAL VALVE (IBV);  Surgeon: Loreli Slot, MD;  Location: [facility] OR;  Service: Thoracic;  Laterality: N/A;   VIDEO BRONCHOSCOPY WITH INSERTION OF INTERBRONCHIAL VALVE (IBV) N/A 02/10/2019   Procedure: VIDEO BRONCHOSCOPY WITH REMOVAL OF INTERBRONCHIAL VALVE (IBV);  Surgeon: Loreli Slot, MD;  Location: [facility] OR;  Service: Thoracic;  Laterality: N/A;    REVIEW OF SYSTEMS:  A comprehensive review of systems was negative except for: Constitutional: positive for fatigue Respiratory: positive for cough and dyspnea on exertion     LABORATORY DATA: Lab Results  Component Value Date   WBC 6.8 07/15/2023   HGB 14.8 07/15/2023   HCT 48.2 07/15/2023   MCV 86 07/15/2023   PLT 205 07/15/2023      Chemistry      Component Value Date/Time   NA 140 07/15/2023 1142   NA 136 04/09/2017 1249   K 4.6 07/15/2023 1142   K 4.6 04/09/2017  1249   CL 101 07/15/2023 1142   CO2 24 07/15/2023 1142   CO2 27 04/09/2017 1249   BUN 13 07/15/2023 1142   BUN 23.9 04/09/2017 1249   CREATININE 1.22 07/15/2023 1142   CREATININE 1.25 (H) 06/11/2022 1132   CREATININE 1.21 (H) 05/08/2020 1051   CREATININE 1.4 (H) 04/09/2017 1249      Component Value Date/Time   CALCIUM 9.1 07/15/2023 1142   CALCIUM 9.6 04/09/2017 1249   ALKPHOS 45 03/04/2023 1515   ALKPHOS 58 04/09/2017 1249   AST 22 03/04/2023 1515   AST 23 06/11/2022 1132   AST 13 04/09/2017 1249   ALT 26 03/04/2023 1515   ALT 27 06/11/2022 1132   ALT 9 04/09/2017 1249   BILITOT 0.4 03/04/2023 1515   BILITOT 0.5 06/11/2022 1132   BILITOT 1.01 04/09/2017 1249       RADIOGRAPHIC STUDIES: No results found.  ASSESSMENT AND PLAN: This is a very pleasant 72 years old white male with a stage IA non-small cell lung cancer status post left lower lobectomy with lymph node dissection under the care of Dr. Dorris Fetch in November 2017 and has been observation since that time. He was also diagnosed with stage IA non-small cell lung cancer in March 2020 and the patient underwent wedge resection of the upper lobe in March 2020.  The patient is currently on observation. He was found on recent CT scan of the chest performed on July 08, 2023 to have increasing mass like soft tissue along the suture margin in the central right upper lobe indicative of disease recurrence with a slightly enlarging spiculated nodule in the lateral right lower lobe.  I ordered a PET scan but the final report is still pending.  On review of the images, I see hypermetabolic activity in the central masslike soft tissue area but I will wait for the final report for confirmation.    Recurrent Non-Small Cell Lung Cancer Stage 1A non-small cell lung cancer with left lower lobectomy (Nov 2017) and wedge resection of the upper lobe (Mar 2020). Recent CT (Oct 8) showed increasing mass in the right upper lobe suture line and  an enlarged nodule in the right lower lobe. PET scan (Nov 19) showed hypermetabolic activity in the central right lung, concerning for recurrence. Awaiting final PET scan report. Discussed potential need for re-biopsy and radiation therapy if recurrence is confirmed. Patient's stroke history limits treatment options. Explained bronchoscopy for biopsy. - Order biopsy via bronchoscopy by pulmonologist or thoracic surgeon -  Consult Dr. Dorris Fetch for biopsy - Consider radiation therapy if biopsy confirms recurrence - Review final PET scan report and adjust plan accordingly - Follow-up in 2-3 weeks post-biopsy  Shortness of Breath and Cough Increased shortness of breath and coughing, possibly related to recurrent lung cancer or other pulmonary issues. - Monitor symptoms and reassess after biopsy results  Left Arm Pain Pain in the left upper arm without trauma or falls. No abnormalities on imaging. - Monitor symptoms and reassess if pain persists or worsens.   The patient was advised to call immediately if he has any other concerning symptoms in the interval. I discussed the assessment and treatment plan with the patient. The patient was provided an opportunity to ask questions and all were answered. The patient agreed with the plan and demonstrated an understanding of the instructions.   The patient was advised to call back or seek an in-person evaluation if the symptoms worsen or if the condition fails to improve as anticipated.  I provided 30 minutes of non face-to-face telephone visit time during this encounter, and > 50% was spent counseling as documented under my assessment & plan.  Lajuana Matte, MD 09/03/2023 11:03 AM  Disclaimer: This note was dictated with voice recognition software. Similar sounding words can inadvertently be transcribed and may not be corrected upon review.

## 2023-09-08 ENCOUNTER — Encounter: Payer: Self-pay | Admitting: Internal Medicine

## 2023-09-08 ENCOUNTER — Telehealth: Payer: Self-pay | Admitting: Internal Medicine

## 2023-09-08 ENCOUNTER — Other Ambulatory Visit: Payer: Self-pay | Admitting: Cardiology

## 2023-09-08 NOTE — Telephone Encounter (Signed)
Patient is aware of scheduled appointment times/dates for follow up per scheduling orders on 09/03/2023

## 2023-09-09 ENCOUNTER — Other Ambulatory Visit: Payer: Self-pay

## 2023-09-09 MED ORDER — DILTIAZEM HCL ER COATED BEADS 180 MG PO CP24: 180.0000 mg | ORAL_CAPSULE | Freq: Every day | ORAL | 3 refills | Status: DC

## 2023-09-14 ENCOUNTER — Other Ambulatory Visit: Payer: Self-pay

## 2023-09-14 ENCOUNTER — Inpatient Hospital Stay (HOSPITAL_COMMUNITY)
Admission: EM | Admit: 2023-09-14 | Discharge: 2023-09-16 | DRG: 189 | Disposition: A | Discharge status: Home / Self Care | Payer: Medicare Other | Attending: Internal Medicine | Admitting: Internal Medicine

## 2023-09-14 ENCOUNTER — Emergency Department (HOSPITAL_COMMUNITY): Payer: Medicare Other

## 2023-09-14 ENCOUNTER — Encounter (HOSPITAL_COMMUNITY): Payer: Self-pay

## 2023-09-14 DIAGNOSIS — Z7901 Long term (current) use of anticoagulants: Secondary | ICD-10-CM | POA: Diagnosis not present

## 2023-09-14 DIAGNOSIS — I7143 Infrarenal abdominal aortic aneurysm, without rupture: Secondary | ICD-10-CM | POA: Diagnosis not present

## 2023-09-14 DIAGNOSIS — I480 Paroxysmal atrial fibrillation: Secondary | ICD-10-CM | POA: Diagnosis present

## 2023-09-14 DIAGNOSIS — R351 Nocturia: Secondary | ICD-10-CM | POA: Diagnosis not present

## 2023-09-14 DIAGNOSIS — E782 Mixed hyperlipidemia: Secondary | ICD-10-CM | POA: Diagnosis not present

## 2023-09-14 DIAGNOSIS — J9621 Acute and chronic respiratory failure with hypoxia: Principal | ICD-10-CM | POA: Diagnosis present

## 2023-09-14 DIAGNOSIS — Z902 Acquired absence of lung [part of]: Secondary | ICD-10-CM

## 2023-09-14 DIAGNOSIS — Z79899 Other long term (current) drug therapy: Secondary | ICD-10-CM

## 2023-09-14 DIAGNOSIS — J441 Chronic obstructive pulmonary disease with (acute) exacerbation: Secondary | ICD-10-CM | POA: Diagnosis not present

## 2023-09-14 DIAGNOSIS — C3490 Malignant neoplasm of unspecified part of unspecified bronchus or lung: Secondary | ICD-10-CM | POA: Diagnosis present

## 2023-09-14 DIAGNOSIS — J9611 Chronic respiratory failure with hypoxia: Secondary | ICD-10-CM | POA: Diagnosis not present

## 2023-09-14 DIAGNOSIS — R42 Dizziness and giddiness: Secondary | ICD-10-CM | POA: Diagnosis not present

## 2023-09-14 DIAGNOSIS — E1165 Type 2 diabetes mellitus with hyperglycemia: Secondary | ICD-10-CM | POA: Diagnosis present

## 2023-09-14 DIAGNOSIS — E66811 Obesity, class 1: Secondary | ICD-10-CM | POA: Diagnosis not present

## 2023-09-14 DIAGNOSIS — I10 Essential (primary) hypertension: Secondary | ICD-10-CM | POA: Diagnosis not present

## 2023-09-14 DIAGNOSIS — E1169 Type 2 diabetes mellitus with other specified complication: Secondary | ICD-10-CM

## 2023-09-14 DIAGNOSIS — Z8261 Family history of arthritis: Secondary | ICD-10-CM

## 2023-09-14 DIAGNOSIS — Z825 Family history of asthma and other chronic lower respiratory diseases: Secondary | ICD-10-CM

## 2023-09-14 DIAGNOSIS — Z87891 Personal history of nicotine dependence: Secondary | ICD-10-CM | POA: Diagnosis not present

## 2023-09-14 DIAGNOSIS — I6930 Unspecified sequelae of cerebral infarction: Secondary | ICD-10-CM

## 2023-09-14 DIAGNOSIS — Z9981 Dependence on supplemental oxygen: Secondary | ICD-10-CM

## 2023-09-14 DIAGNOSIS — G4700 Insomnia, unspecified: Secondary | ICD-10-CM | POA: Diagnosis not present

## 2023-09-14 DIAGNOSIS — E785 Hyperlipidemia, unspecified: Secondary | ICD-10-CM | POA: Diagnosis present

## 2023-09-14 DIAGNOSIS — J439 Emphysema, unspecified: Secondary | ICD-10-CM | POA: Diagnosis present

## 2023-09-14 DIAGNOSIS — Z85118 Personal history of other malignant neoplasm of bronchus and lung: Secondary | ICD-10-CM | POA: Diagnosis not present

## 2023-09-14 DIAGNOSIS — Z7951 Long term (current) use of inhaled steroids: Secondary | ICD-10-CM

## 2023-09-14 DIAGNOSIS — G4733 Obstructive sleep apnea (adult) (pediatric): Secondary | ICD-10-CM | POA: Diagnosis not present

## 2023-09-14 DIAGNOSIS — D72829 Elevated white blood cell count, unspecified: Secondary | ICD-10-CM | POA: Diagnosis present

## 2023-09-14 DIAGNOSIS — Z9049 Acquired absence of other specified parts of digestive tract: Secondary | ICD-10-CM

## 2023-09-14 DIAGNOSIS — R0602 Shortness of breath: Secondary | ICD-10-CM | POA: Diagnosis not present

## 2023-09-14 DIAGNOSIS — Z6831 Body mass index (BMI) 31.0-31.9, adult: Secondary | ICD-10-CM | POA: Diagnosis not present

## 2023-09-14 DIAGNOSIS — E119 Type 2 diabetes mellitus without complications: Secondary | ICD-10-CM

## 2023-09-14 DIAGNOSIS — N401 Enlarged prostate with lower urinary tract symptoms: Secondary | ICD-10-CM | POA: Diagnosis not present

## 2023-09-14 DIAGNOSIS — M79603 Pain in arm, unspecified: Secondary | ICD-10-CM | POA: Diagnosis not present

## 2023-09-14 DIAGNOSIS — J479 Bronchiectasis, uncomplicated: Secondary | ICD-10-CM | POA: Diagnosis not present

## 2023-09-14 DIAGNOSIS — R0902 Hypoxemia: Secondary | ICD-10-CM | POA: Diagnosis not present

## 2023-09-14 DIAGNOSIS — J449 Chronic obstructive pulmonary disease, unspecified: Secondary | ICD-10-CM | POA: Diagnosis not present

## 2023-09-14 DIAGNOSIS — R079 Chest pain, unspecified: Secondary | ICD-10-CM | POA: Diagnosis not present

## 2023-09-14 DIAGNOSIS — C3491 Malignant neoplasm of unspecified part of right bronchus or lung: Secondary | ICD-10-CM | POA: Diagnosis not present

## 2023-09-14 LAB — BASIC METABOLIC PANEL
Anion gap: 8 (ref 5–15)
BUN: 14 mg/dL (ref 8–23)
CO2: 28 mmol/L (ref 22–32)
Calcium: 9.7 mg/dL (ref 8.9–10.3)
Chloride: 101 mmol/L (ref 98–111)
Creatinine, Ser: 1.14 mg/dL (ref 0.61–1.24)
GFR, Estimated: >60 mL/min (ref >60)
Glucose, Bld: 122 mg/dL — ABNORMAL HIGH (ref 70–99)
Potassium: 4.3 mmol/L (ref 3.5–5.1)
Sodium: 137 mmol/L (ref 135–145)

## 2023-09-14 LAB — CBC WITH DIFFERENTIAL/PLATELET
Abs Immature Granulocytes: 0.04 10*3/uL (ref 0.00–0.07)
Basophils Absolute: 0.1 10*3/uL (ref 0.0–0.1)
Basophils Relative: 1 %
Eosinophils Absolute: 0.1 10*3/uL (ref 0.0–0.5)
Eosinophils Relative: 2 %
HCT: 47.5 % (ref 39.0–52.0)
Hemoglobin: 15.1 g/dL (ref 13.0–17.0)
Immature Granulocytes: 1 %
Lymphocytes Relative: 12 %
Lymphs Abs: 1.1 10*3/uL (ref 0.7–4.0)
MCH: 27.4 pg (ref 26.0–34.0)
MCHC: 31.8 g/dL (ref 30.0–36.0)
MCV: 86.2 fL (ref 80.0–100.0)
Monocytes Absolute: 1.1 10*3/uL — ABNORMAL HIGH (ref 0.1–1.0)
Monocytes Relative: 12 %
Neutro Abs: 6.4 10*3/uL (ref 1.7–7.7)
Neutrophils Relative %: 72 %
Platelets: 220 10*3/uL (ref 150–400)
RBC: 5.51 MIL/uL (ref 4.22–5.81)
RDW: 14.7 % (ref 11.5–15.5)
WBC: 8.7 10*3/uL (ref 4.0–10.5)
nRBC: 0 % (ref 0.0–0.2)

## 2023-09-14 LAB — PROTIME-INR
INR: 2.1 — ABNORMAL HIGH (ref 0.8–1.2)
Prothrombin Time: 24.2 s — ABNORMAL HIGH (ref 11.4–15.2)

## 2023-09-14 LAB — RESP PANEL BY RT-PCR (RSV, FLU A&B, COVID)  RVPGX2
Influenza A by PCR: NEGATIVE
Influenza B by PCR: NEGATIVE
Resp Syncytial Virus by PCR: NEGATIVE
SARS Coronavirus 2 by RT PCR: NEGATIVE

## 2023-09-14 LAB — TROPONIN I (HIGH SENSITIVITY)
Troponin I (High Sensitivity): 6 ng/L (ref <18)
Troponin I (High Sensitivity): 6 ng/L (ref <18)

## 2023-09-14 LAB — BRAIN NATRIURETIC PEPTIDE: B Natriuretic Peptide: 86 pg/mL (ref 0.0–100.0)

## 2023-09-14 MED ORDER — ALBUTEROL SULFATE (2.5 MG/3ML) 0.083% IN NEBU
INHALATION_SOLUTION | RESPIRATORY_TRACT | Status: AC
  Administered 2023-09-14: 2.5 mg
  Filled 2023-09-14: qty 3

## 2023-09-14 MED ORDER — DILTIAZEM HCL ER COATED BEADS 180 MG PO CP24
180.0000 mg | ORAL_CAPSULE | Freq: Every day | ORAL | Status: DC
  Administered 2023-09-15 – 2023-09-16 (×2): 180 mg via ORAL
  Filled 2023-09-14 (×4): qty 1

## 2023-09-14 MED ORDER — FINASTERIDE 5 MG PO TABS
5.0000 mg | ORAL_TABLET | Freq: Every day | ORAL | Status: DC
  Administered 2023-09-14 – 2023-09-16 (×3): 5 mg via ORAL
  Filled 2023-09-14 (×3): qty 1

## 2023-09-14 MED ORDER — VITAMIN D 25 MCG (1000 UNIT) PO TABS
5000.0000 [IU] | ORAL_TABLET | Freq: Every day | ORAL | Status: DC
  Administered 2023-09-15 – 2023-09-16 (×2): 5000 [IU] via ORAL
  Filled 2023-09-14 (×2): qty 5

## 2023-09-14 MED ORDER — ROSUVASTATIN CALCIUM 20 MG PO TABS
40.0000 mg | ORAL_TABLET | Freq: Every day | ORAL | Status: DC
  Administered 2023-09-15 – 2023-09-16 (×2): 40 mg via ORAL
  Filled 2023-09-14 (×3): qty 2

## 2023-09-14 MED ORDER — MECLIZINE HCL 12.5 MG PO TABS: 25.0000 mg | ORAL_TABLET | Freq: Three times a day (TID) | ORAL | Status: DC | PRN

## 2023-09-14 MED ORDER — LORATADINE 10 MG PO TABS
10.0000 mg | ORAL_TABLET | Freq: Every day | ORAL | Status: DC
  Administered 2023-09-15 – 2023-09-16 (×2): 10 mg via ORAL
  Filled 2023-09-14 (×2): qty 1

## 2023-09-14 MED ORDER — WARFARIN - PHARMACIST DOSING INPATIENT: Freq: Every day | Status: DC

## 2023-09-14 MED ORDER — IPRATROPIUM-ALBUTEROL 0.5-2.5 (3) MG/3ML IN SOLN
3.0000 mL | Freq: Once | RESPIRATORY_TRACT | Status: AC
  Administered 2023-09-14: 3 mL via RESPIRATORY_TRACT

## 2023-09-14 MED ORDER — METHYLPREDNISOLONE SODIUM SUCC 125 MG IJ SOLR
60.0000 mg | Freq: Two times a day (BID) | INTRAMUSCULAR | Status: DC
  Administered 2023-09-15 – 2023-09-16 (×4): 60 mg via INTRAVENOUS
  Filled 2023-09-14 (×4): qty 2

## 2023-09-14 MED ORDER — POLYETHYLENE GLYCOL 3350 17 G PO PACK: 17.0000 g | PACK | Freq: Every day | ORAL | Status: DC | PRN

## 2023-09-14 MED ORDER — ALBUTEROL SULFATE (2.5 MG/3ML) 0.083% IN NEBU
2.5000 mg | INHALATION_SOLUTION | RESPIRATORY_TRACT | Status: DC | PRN
  Administered 2023-09-15 – 2023-09-16 (×2): 2.5 mg via RESPIRATORY_TRACT
  Filled 2023-09-14 (×2): qty 3

## 2023-09-14 MED ORDER — IPRATROPIUM-ALBUTEROL 0.5-2.5 (3) MG/3ML IN SOLN
3.0000 mL | Freq: Once | RESPIRATORY_TRACT | Status: AC
  Administered 2023-09-14: 3 mL via RESPIRATORY_TRACT
  Filled 2023-09-14: qty 6

## 2023-09-14 MED ORDER — ACETAMINOPHEN 325 MG PO TABS
650.0000 mg | ORAL_TABLET | Freq: Four times a day (QID) | ORAL | Status: DC | PRN
  Filled 2023-09-14: qty 2

## 2023-09-14 MED ORDER — ACETAMINOPHEN 650 MG RE SUPP: 650.0000 mg | Freq: Four times a day (QID) | RECTAL | Status: DC | PRN

## 2023-09-14 MED ORDER — FENOFIBRATE 160 MG PO TABS
160.0000 mg | ORAL_TABLET | Freq: Every day | ORAL | Status: DC
  Administered 2023-09-15 – 2023-09-16 (×2): 160 mg via ORAL
  Filled 2023-09-14 (×2): qty 1

## 2023-09-14 MED ORDER — ONDANSETRON HCL 4 MG PO TABS: 4.0000 mg | ORAL_TABLET | Freq: Four times a day (QID) | ORAL | Status: DC | PRN

## 2023-09-14 MED ORDER — IPRATROPIUM-ALBUTEROL 0.5-2.5 (3) MG/3ML IN SOLN
3.0000 mL | Freq: Four times a day (QID) | RESPIRATORY_TRACT | Status: DC
  Administered 2023-09-14 – 2023-09-16 (×7): 3 mL via RESPIRATORY_TRACT
  Filled 2023-09-14 (×7): qty 3

## 2023-09-14 MED ORDER — BUDESONIDE 0.25 MG/2ML IN SUSP
0.2500 mg | Freq: Two times a day (BID) | RESPIRATORY_TRACT | Status: DC
  Administered 2023-09-14 – 2023-09-15 (×2): 0.25 mg via RESPIRATORY_TRACT
  Filled 2023-09-14 (×2): qty 2

## 2023-09-14 MED ORDER — AMOXICILLIN-POT CLAVULANATE 875-125 MG PO TABS
1.0000 | ORAL_TABLET | Freq: Once | ORAL | Status: AC
  Administered 2023-09-14: 1 via ORAL
  Filled 2023-09-14: qty 1

## 2023-09-14 MED ORDER — TRAZODONE HCL 50 MG PO TABS
100.0000 mg | ORAL_TABLET | Freq: Every day | ORAL | Status: DC
  Filled 2023-09-14 (×2): qty 2

## 2023-09-14 MED ORDER — ONDANSETRON HCL 4 MG/2ML IJ SOLN
4.0000 mg | Freq: Four times a day (QID) | INTRAMUSCULAR | Status: DC | PRN
Start: 2023-09-14 — End: 2023-09-16

## 2023-09-14 MED ORDER — TAMSULOSIN HCL 0.4 MG PO CAPS
0.8000 mg | ORAL_CAPSULE | Freq: Every day | ORAL | Status: DC
  Administered 2023-09-15 – 2023-09-16 (×2): 0.8 mg via ORAL
  Filled 2023-09-14 (×2): qty 2

## 2023-09-14 MED ORDER — METHYLPREDNISOLONE SODIUM SUCC 125 MG IJ SOLR
125.0000 mg | Freq: Once | INTRAMUSCULAR | Status: AC
  Administered 2023-09-14: 125 mg via INTRAVENOUS
  Filled 2023-09-14: qty 2

## 2023-09-14 MED ORDER — IPRATROPIUM-ALBUTEROL 0.5-2.5 (3) MG/3ML IN SOLN
3.0000 mL | Freq: Once | RESPIRATORY_TRACT | Status: AC
  Administered 2023-09-14: 3 mL via RESPIRATORY_TRACT
  Filled 2023-09-14: qty 3

## 2023-09-14 MED ORDER — WARFARIN SODIUM 5 MG PO TABS
5.0000 mg | ORAL_TABLET | Freq: Once | ORAL | Status: AC
  Administered 2023-09-14: 5 mg via ORAL
  Filled 2023-09-14: qty 1

## 2023-09-14 MED ORDER — DOXYCYCLINE HYCLATE 100 MG PO TABS
100.0000 mg | ORAL_TABLET | Freq: Two times a day (BID) | ORAL | Status: DC
  Administered 2023-09-14 – 2023-09-16 (×4): 100 mg via ORAL
  Filled 2023-09-14 (×4): qty 1

## 2023-09-14 MED ORDER — ACETAMINOPHEN 325 MG PO TABS
650.0000 mg | ORAL_TABLET | Freq: Once | ORAL | Status: AC
  Administered 2023-09-14: 650 mg via ORAL
  Filled 2023-09-14: qty 2

## 2023-09-14 MED ORDER — AZITHROMYCIN 250 MG PO TABS: 500.0000 mg | ORAL_TABLET | Freq: Once | ORAL | Status: DC

## 2023-09-14 MED ORDER — WARFARIN SODIUM 5 MG PO TABS: 5.0000 mg | ORAL_TABLET | Freq: Every day | ORAL | Status: DC

## 2023-09-14 NOTE — ED Notes (Addendum)
Placed pt on 2L O2, sat 90 prior to O2. States he uses oxygen at home 2l as needed but has been needing it more frequently. Before oxygen was placed on pt stated "I can't breathe I need some oxygen". Sat up to 98 with 2L. -Hailey S NT3

## 2023-09-14 NOTE — ED Notes (Signed)
Pt sitting on side of bed with wife at bedside. Pt continues to complain of pain in left arm rating pain at 6 on scale of 0-10.

## 2023-09-14 NOTE — Progress Notes (Signed)
PHARMACY - ANTICOAGULATION CONSULT NOTE  Pharmacy Consult for warfarin Indication: atrial fibrillation  No Known Allergies  Patient Measurements: Height: 5\' 10"  (177.8 cm) Weight: 100.2 kg (221 lb) IBW/kg (Calculated) : 73 Heparin Dosing Weight:   Vital Signs: Temp: 98 F (36.7 C) (12/15 1345) Temp Source: Oral (12/15 1345) BP: 127/74 (12/15 1345) Pulse Rate: 80 (12/15 1345)  Labs: Recent Labs    09/14/23 0936 09/14/23 0949 09/14/23 1134  HGB 15.1  --   --   HCT 47.5  --   --   PLT 220  --   --   LABPROT  --  24.2*  --   INR  --  2.1*  --   CREATININE 1.14  --   --   TROPONINIHS 6  --  6    Estimated Creatinine Clearance: 68.5 mL/min (by C-G formula based on SCr of 1.14 mg/dL).   Medical History: Past Medical History:  Diagnosis Date   A-fib (HCC) 2017   after last lung surgery patient had a history if Afib documented in hospital    Acute appendicitis 07/14/2021   Arthritis    back & legs ( knees)   Cancer (HCC)    lung   Complication of anesthesia    agitated upon waking from anesth.    Emphysema    Emphysema of lung (HCC)    Hyperlipidemia    Hypertension    OSA (obstructive sleep apnea) 11/25/2022   Oxygen dependent    2L continuous   Stroke [facility])    Tobacco abuse     Medications:  (Not in a hospital admission)   Assessment: Pharmacy consulted to dose warfarin for atrial fibrillation. INR on admission is therapeutic at 2.1.  Home dose listed as 2.5 mg on Tue + Fri and 5 mg ROW with last dose 12/14 @ 1700.  Goal of Therapy:  INR 2-3 Monitor platelets by anticoagulation protocol: Yes   Plan:  Warfarin 5 mg x 1 dose. Monitor daily INR and s/s of bleeding.  Judeth Cornfield, PharmD Clinical Pharmacist 09/14/2023 2:47 PM

## 2023-09-14 NOTE — ED Notes (Signed)
Patient transported to X-ray 

## 2023-09-14 NOTE — H&P (Signed)
History and Physical    Randall Sullivan ZOX:[id number] DOB: [birth year] DOA: 09/14/2023  PCP: Anabel Halon, MD   Patient coming from: Home  Chief Complaint: Worsening dyspnea  HPI: Randall Sullivan is a 72 y.o. male with medical history significant for COPD with chronic hypoxemia, prior tobacco abuse, lung cancer with prior lobectomy, paroxysmal atrial fibrillation on warfarin, dyslipidemia, and hypertension who presented to the ED with worsening shortness of breath as well as increasing cough with sputum production over the last 2 days.  He states that he has home oxygen to use as needed, but has been needing it more frequently over the last 2 days.  He denies any chest pain, fevers, or chills and has been taking his medications routinely.  He denies any sick contacts.  He also complains of some left arm achiness, but this appears to be a chronic problem.  He denies any current tobacco abuse or alcohol use.   ED Course: Vital signs stable and patient afebrile.  He arrived with adequate oxygenation on his baseline 2 L nasal cannula oxygen.  INR 2.1 and chest x-ray with findings of COPD and no pneumonia.  He has been given breathing treatment and IV steroids with minimal improvement.  Review of Systems: Reviewed as noted above, otherwise negative.  Past Medical History:  Diagnosis Date   A-fib [facility]) 2017   after last lung surgery patient had a history if Afib documented in hospital    Acute appendicitis 07/14/2021   Arthritis    back & legs ( knees)   Cancer (HCC)    lung   Complication of anesthesia    agitated upon waking from anesth.    Emphysema    Emphysema of lung (HCC)    Hyperlipidemia    Hypertension    OSA (obstructive sleep apnea) 11/25/2022   Oxygen dependent    2L continuous   Stroke [facility])    Tobacco abuse     Past Surgical History:  Procedure Laterality Date   APPENDECTOMY  07/15/2021   HERNIA REPAIR Bilateral 1999   umbilical   LAPAROSCOPIC APPENDECTOMY N/A  07/15/2021   Procedure: APPENDECTOMY LAPAROSCOPIC;  Surgeon: Lucretia Roers, MD;  Location: [facility];  Service: General;  Laterality: N/A;   VIDEO ASSISTED THORACOSCOPY Right 12/21/2018   Procedure: VIDEO ASSISTED THORACOSCOPY AND RESECTION OF BLEBS RIGHT LOWER LOBE;  Surgeon: Loreli Slot, MD;  Location: [facility] OR;  Service: Thoracic;  Laterality: Right;   VIDEO ASSISTED THORACOSCOPY (VATS)/ LOBECTOMY Left 08/01/2016   Procedure: VIDEO ASSISTED THORACOSCOPY (VATS)/ LEFT UPPER LOBECTOMY with lymph node sampling and onQ placement;  Surgeon: Loreli Slot, MD;  Location: [facility] OR;  Service: Thoracic;  Laterality: Left;   VIDEO ASSISTED THORACOSCOPY (VATS)/WEDGE RESECTION Right 12/09/2018   Procedure: VIDEO ASSISTED THORACOSCOPY (VATS)/WEDGE RESECTION;  Surgeon: Loreli Slot, MD;  Location: [facility] OR;  Service: Thoracic;  Laterality: Right;   VIDEO BRONCHOSCOPY Bilateral 12/21/2018   Procedure: VIDEO BRONCHOSCOPY;  Surgeon: Loreli Slot, MD;  Location: [facility] OR;  Service: Thoracic;  Laterality: Bilateral;   VIDEO BRONCHOSCOPY WITH ENDOBRONCHIAL NAVIGATION N/A 07/03/2016   Procedure: VIDEO BRONCHOSCOPY WITH ENDOBRONCHIAL NAVIGATION;  Surgeon: Loreli Slot, MD;  Location: [facility] OR;  Service: Thoracic;  Laterality: N/A;   VIDEO BRONCHOSCOPY WITH INSERTION OF INTERBRONCHIAL VALVE (IBV) N/A 12/11/2018   Procedure: VIDEO BRONCHOSCOPY WITH INSERTION OF INTERBRONCHIAL VALVE (IBV);  Surgeon: Loreli Slot, MD;  Location: [facility] OR;  Service: Thoracic;  Laterality: N/A;   VIDEO BRONCHOSCOPY WITH  INSERTION OF INTERBRONCHIAL VALVE (IBV) N/A 02/10/2019   Procedure: VIDEO BRONCHOSCOPY WITH REMOVAL OF INTERBRONCHIAL VALVE (IBV);  Surgeon: Loreli Slot, MD;  Location: [facility] OR;  Service: Thoracic;  Laterality: N/A;     reports that he quit smoking about 7 years ago. His smoking use included cigarettes. He started smoking about 47 years ago. He has a 40 pack-year smoking history.  He has never used smokeless tobacco. He reports that he does not currently use alcohol. He reports that he does not use drugs.  No Active Allergies  Family History  Problem Relation Age of Onset   Cancer Mother    Cancer Sister    Aneurysm Sister    Kidney disease Paternal Aunt    Asthma Brother    Cancer - Lung Brother    Cancer Brother    Arthritis Brother     Prior to Admission medications   Medication Sig Start Date End Date Taking? Authorizing Provider  acetaminophen (TYLENOL) 325 MG tablet Take 2 tablets (650 mg total) by mouth every 6 (six) hours as needed for mild pain (or Fever >/= 101). 03/22/21   Angiulli, Mcarthur Rossetti, PA-C  Cholecalciferol (VITAMIN D3) 125 MCG (5000 UT) CAPS Take 1 capsule (5,000 Units total) by mouth daily. 03/22/21   Angiulli, Mcarthur Rossetti, PA-C  diltiazem (CARDIZEM CD) 180 MG 24 hr capsule Take 1 capsule (180 mg total) by mouth daily. 09/09/23   Anabel Halon, MD  fenofibrate (TRICOR) 145 MG tablet Take 1 tablet (145 mg total) by mouth daily. 06/30/23   Anabel Halon, MD  finasteride (PROSCAR) 5 MG tablet Take 1 tablet (5 mg total) by mouth daily. 07/01/23   Anabel Halon, MD  ipratropium-albuterol (DUONEB) 0.5-2.5 (3) MG/3ML SOLN USE 1 AMPULE IN NEBULIZER 4 TIMES DAILY AND AS NEEDED FOR SHORTNESS OF BREATH AND FOR WHEEZING 08/18/23   Anabel Halon, MD  loratadine (CLARITIN) 10 MG tablet Take 1 tablet (10 mg total) by mouth daily. 10/18/22   Kerri Perches, MD  meclizine (ANTIVERT) 25 MG tablet Take 1 tablet (25 mg total) by mouth 3 (three) times daily as needed for dizziness. 07/09/21   Anabel Halon, MD  methylPREDNISolone (MEDROL DOSEPAK) 4 MG TBPK tablet Take as package instructions. 07/15/23   Anabel Halon, MD  OXYGEN Inhale 2-3 L into the lungs continuous. Hx of lung ca, copd, and emphysema    [provider]  polyethylene glycol (MIRALAX / GLYCOLAX) 17 g packet Take 17 g by mouth daily as needed for mild constipation. 03/22/21    Angiulli, Mcarthur Rossetti, PA-C  rosuvastatin (CRESTOR) 40 MG tablet Take 1 tablet (40 mg total) by mouth daily. 09/03/23   Rollene Rotunda, MD  tamsulosin (FLOMAX) 0.4 MG CAPS capsule Take 2 capsules (0.8 mg total) by mouth daily. 10/29/22   Anabel Halon, MD  traZODone (DESYREL) 100 MG tablet Take 1 tablet (100 mg total) by mouth at bedtime. 06/13/22   Anabel Halon, MD  warfarin (COUMADIN) 5 MG tablet Take 1 tablet (5 mg total) by mouth daily. 03/31/23   Anabel Halon, MD    Physical Exam: Vitals:   09/14/23 0916 09/14/23 0917 09/14/23 0930 09/14/23 1345  BP: 125/80  (!) 144/95 127/74  Pulse: 73  82 80  Resp: 20  (!) 22 19  Temp: 98 F (36.7 C) 98.2 F (36.8 C)  98 F (36.7 C)  TempSrc: Oral Oral  Oral  SpO2: 98%  99% 96%  Weight:  100.2 kg    Height:  5\' 10"  (1.778 m)      Constitutional: NAD, calm, comfortable, hard of hearing and receiving breathing treatment Vitals:   09/14/23 0916 09/14/23 0917 09/14/23 0930 09/14/23 1345  BP: 125/80  (!) 144/95 127/74  Pulse: 73  82 80  Resp: 20  (!) 22 19  Temp: 98 F (36.7 C) 98.2 F (36.8 C)  98 F (36.7 C)  TempSrc: Oral Oral  Oral  SpO2: 98%  99% 96%  Weight:  100.2 kg    Height:  5\' 10"  (1.778 m)     Eyes: lids and conjunctivae normal Neck: normal, supple Respiratory: Diminished to auscultation bilaterally. Normal respiratory effort. No accessory muscle use.  Currently on breathing treatment Cardiovascular:regular rate and rhythm, no murmurs. Abdomen: no tenderness, no distention. Bowel sounds positive.  Musculoskeletal:  No edema. Skin: no rashes, lesions, ulcers.  Psychiatric: Flat affect  Labs on Admission: I have personally reviewed following labs and imaging studies  CBC: Recent Labs  Lab 09/14/23 0936  WBC 8.7  NEUTROABS 6.4  HGB 15.1  HCT 47.5  MCV 86.2  PLT 220   Basic Metabolic Panel: Recent Labs  Lab 09/14/23 0936  NA 137  K 4.3  CL 101  CO2 28  GLUCOSE 122*  BUN 14  CREATININE 1.14  CALCIUM  9.7   GFR: Estimated Creatinine Clearance: 68.5 mL/min (by C-G formula based on SCr of 1.14 mg/dL). Liver Function Tests: No results for input(s): "AST", "ALT", "ALKPHOS", "BILITOT", "PROT", "ALBUMIN" in the last 168 hours. No results for input(s): "LIPASE", "AMYLASE" in the last 168 hours. No results for input(s): "AMMONIA" in the last 168 hours. Coagulation Profile: Recent Labs  Lab 09/14/23 0949  INR 2.1*   Cardiac Enzymes: No results for input(s): "CKTOTAL", "CKMB", "CKMBINDEX", "TROPONINI" in the last 168 hours. BNP (last 3 results) No results for input(s): "PROBNP" in the last 8760 hours. HbA1C: No results for input(s): "HGBA1C" in the last 72 hours. CBG: No results for input(s): "GLUCAP" in the last 168 hours. Lipid Profile: No results for input(s): "CHOL", "HDL", "LDLCALC", "TRIG", "CHOLHDL", "LDLDIRECT" in the last 72 hours. Thyroid Function Tests: No results for input(s): "TSH", "T4TOTAL", "FREET4", "T3FREE", "THYROIDAB" in the last 72 hours. Anemia Panel: No results for input(s): "VITAMINB12", "FOLATE", "FERRITIN", "TIBC", "IRON", "RETICCTPCT" in the last 72 hours. Urine analysis:    Component Value Date/Time   COLORURINE YELLOW 03/12/2021 0620   APPEARANCEUR CLEAR 03/12/2021 0620   LABSPEC 1.019 03/12/2021 0620   PHURINE 5.0 03/12/2021 0620   GLUCOSEU 50 (A) 03/12/2021 0620   HGBUR NEGATIVE 03/12/2021 0620   BILIRUBINUR NEGATIVE 03/12/2021 0620   KETONESUR NEGATIVE 03/12/2021 0620   PROTEINUR NEGATIVE 03/12/2021 0620   UROBILINOGEN 0.2 10/04/2011 1026   NITRITE NEGATIVE 03/12/2021 0620   LEUKOCYTESUR NEGATIVE 03/12/2021 0620    Radiological Exams on Admission: DG Chest 2 View Result Date: 09/14/2023 CLINICAL DATA:  Left chest and arm pain for 2 weeks. Shortness of breath. Hypoxia. EXAM: CHEST - 2 VIEW COMPARISON:  03/29/2022 FINDINGS: The heart size and mediastinal contours are within normal limits. Changes of COPD again noted. No acute infiltrate, edema,  or pleural effusion IMPRESSION: COPD. No active cardiopulmonary disease. Electronically Signed   By: Danae Orleans M.D.   On: 09/14/2023 10:47    EKG: Independently reviewed. SR 74bpm.  Assessment/Plan Active Problems:   Primary lung adenocarcinoma (HCC)   Essential (primary) hypertension   PAF (paroxysmal atrial fibrillation) (HCC)   Hyperlipidemia   Benign  prostatic hyperplasia with nocturia   Diabetes mellitus (HCC)   History of CVA with residual deficit   Chronic respiratory failure with hypoxia (HCC)   COPD with acute exacerbation (HCC)    Acute COPD exacerbation with chronic hypoxemia -Continue DuoNebs every 6 hours -IV Solu-Medrol 60 twice daily -Pulmicort twice daily -Doxycycline twice daily  Paroxysmal atrial fibrillation -Currently rate controlled and patient currently in sinus rhythm -Warfarin for anticoagulation and INR therapeutic, pharmacy to dose  Lung cancer status post lobectomy -Follows with oncologist Dr. Shirline Frees and awaiting repeat biopsy  History of CVA -Continue warfarin along with Crestor and fenofibrate  Dyslipidemia -Continue Crestor and fenofibrate  Insomnia -Continue trazodone  BPH -Continue Flomax and finasteride  Class I obesity -BMI 31.71   DVT prophylaxis: Warfarin Code Status: Full Family Communication: Wife at bedside 12/15 Disposition Plan:Admit for COPD ex Consults called:None Admission status: Inpatient, Tele  Severity of Illness: The appropriate patient status for this patient is INPATIENT. Inpatient status is judged to be reasonable and necessary in order to provide the required intensity of service to ensure the patient's safety. The patient's presenting symptoms, physical exam findings, and initial radiographic and laboratory data in the context of their chronic comorbidities is felt to place them at high risk for further clinical deterioration. Furthermore, it is not anticipated that the patient will be medically stable  for discharge from the hospital within 2 midnights of admission.   * I certify that at the point of admission it is my clinical judgment that the patient will require inpatient hospital care spanning beyond 2 midnights from the point of admission due to high intensity of service, high risk for further deterioration and high frequency of surveillance required.*   Randall Trammel D Samiyyah Moffa DO [facility]  If 7PM-7AM, please contact night-coverage [email or web address]  09/14/2023, 2:08 PM

## 2023-09-14 NOTE — ED Provider Notes (Signed)
[place] EMERGENCY DEPARTMENT AT [facility] Provider Note   CSN: [id number] Arrival date & time: 09/14/23  4098     History  Chief Complaint  Patient presents with   Shortness of Breath    Randall Sullivan is a 72 y.o. male.  He has PMH of hypertension, COPD, paroxysmal A-fib on warfarin, lung cancer, CVA, diabetes, and infrarenal aortic aneurysm.   He presents to the ER with increased shortness of breath and left arm pain described as aching.  He states that started a couple of weeks ago and then seemed to get better and then got worse again a couple of days ago.  He has oxygen at home to use as needed but states it does not seem like it is helping.  He also has been using DuoNebs at home but they do not seem to help with the wheezing.  He denies any chest pain.  He reports the arm pain is always had for a long time is not worse with exertion.  Denies any sweating.  He occasionally gets dizzy baseline from his history of stroke but has not had any lightheadedness or new dizziness with this illness.  His cough is sometimes productive of clear sputum.  He denies leg pain or swelling.  He is not a current smoker.  He has previously had treatment for lung cancer, he has a new mass but is still awaiting biopsy and is not any current cancer treatments.   HPI     Home Medications Prior to Admission medications   Medication Sig Start Date End Date Taking? Authorizing Provider  acetaminophen (TYLENOL) 325 MG tablet Take 2 tablets (650 mg total) by mouth every 6 (six) hours as needed for mild pain (or Fever >/= 101). 03/22/21   Angiulli, Mcarthur Rossetti, PA-C  Cholecalciferol (VITAMIN D3) 125 MCG (5000 UT) CAPS Take 1 capsule (5,000 Units total) by mouth daily. 03/22/21   Angiulli, Mcarthur Rossetti, PA-C  diltiazem (CARDIZEM CD) 180 MG 24 hr capsule Take 1 capsule (180 mg total) by mouth daily. 09/09/23   Anabel Halon, MD  fenofibrate (TRICOR) 145 MG tablet Take 1 tablet (145 mg total) by mouth  daily. 06/30/23   Anabel Halon, MD  finasteride (PROSCAR) 5 MG tablet Take 1 tablet (5 mg total) by mouth daily. 07/01/23   Anabel Halon, MD  ipratropium-albuterol (DUONEB) 0.5-2.5 (3) MG/3ML SOLN USE 1 AMPULE IN NEBULIZER 4 TIMES DAILY AND AS NEEDED FOR SHORTNESS OF BREATH AND FOR WHEEZING 08/18/23   Anabel Halon, MD  loratadine (CLARITIN) 10 MG tablet Take 1 tablet (10 mg total) by mouth daily. 10/18/22   Kerri Perches, MD  meclizine (ANTIVERT) 25 MG tablet Take 1 tablet (25 mg total) by mouth 3 (three) times daily as needed for dizziness. 07/09/21   Anabel Halon, MD  methylPREDNISolone (MEDROL DOSEPAK) 4 MG TBPK tablet Take as package instructions. 07/15/23   Anabel Halon, MD  OXYGEN Inhale 2-3 L into the lungs continuous. Hx of lung ca, copd, and emphysema    [provider]  polyethylene glycol (MIRALAX / GLYCOLAX) 17 g packet Take 17 g by mouth daily as needed for mild constipation. 03/22/21   Angiulli, Mcarthur Rossetti, PA-C  rosuvastatin (CRESTOR) 40 MG tablet Take 1 tablet (40 mg total) by mouth daily. 09/03/23   Rollene Rotunda, MD  tamsulosin (FLOMAX) 0.4 MG CAPS capsule Take 2 capsules (0.8 mg total) by mouth daily. 10/29/22   Anabel Halon, MD  traZODone (DESYREL) 100 MG tablet Take 1 tablet (100 mg total) by mouth at bedtime. 06/13/22   Anabel Halon, MD  warfarin (COUMADIN) 5 MG tablet Take 1 tablet (5 mg total) by mouth daily. 03/31/23   Anabel Halon, MD      Allergies    Patient has no active allergies.    Review of Systems   Review of Systems  Physical Exam Updated Vital Signs BP 125/80 (BP Location: Right Arm)   Pulse 73   Temp 98.2 F (36.8 C) (Oral)   Resp 20   Ht 5\' 10"  (1.778 m)   Wt 100.2 kg   SpO2 98%   BMI 31.71 kg/m  Physical Exam Vitals and nursing note reviewed.  Constitutional:      General: He is not in acute distress.    Appearance: He is well-developed.  HENT:     Head: Normocephalic and atraumatic.     Mouth/Throat:      Mouth: Mucous membranes are moist.  Eyes:     Extraocular Movements: Extraocular movements intact.     Conjunctiva/sclera: Conjunctivae normal.     Pupils: Pupils are equal, round, and reactive to light.  Cardiovascular:     Rate and Rhythm: Normal rate and regular rhythm.     Heart sounds: No murmur heard. Pulmonary:     Effort: Pulmonary effort is normal. No respiratory distress.     Breath sounds: Examination of the right-upper field reveals wheezing. Examination of the left-upper field reveals wheezing. Examination of the right-middle field reveals wheezing. Examination of the left-middle field reveals wheezing. Examination of the right-lower field reveals wheezing. Examination of the left-lower field reveals wheezing. Wheezing present.  Abdominal:     Palpations: Abdomen is soft.     Tenderness: There is no abdominal tenderness.  Musculoskeletal:        General: No swelling or tenderness.     Cervical back: Neck supple.     Right lower leg: No tenderness. No edema.     Left lower leg: No tenderness. No edema.  Skin:    General: Skin is warm and dry.     Capillary Refill: Capillary refill takes less than 2 seconds.  Neurological:     General: No focal deficit present.     Mental Status: He is alert and oriented to person, place, and time.  Psychiatric:        Mood and Affect: Mood normal.     ED Results / Procedures / Treatments   Labs (all labs ordered are listed, but only abnormal results are displayed) Labs Reviewed  BASIC METABOLIC PANEL - Abnormal; Notable for the following components:      Result Value   Glucose, Bld 122 (*)    All other components within normal limits  CBC WITH DIFFERENTIAL/PLATELET - Abnormal; Notable for the following components:   Monocytes Absolute 1.1 (*)    All other components within normal limits  PROTIME-INR - Abnormal; Notable for the following components:   Prothrombin Time 24.2 (*)    INR 2.1 (*)    All other components within normal  limits  RESP PANEL BY RT-PCR (RSV, FLU A&B, COVID)  RVPGX2  BRAIN NATRIURETIC PEPTIDE  TROPONIN I (HIGH SENSITIVITY)  TROPONIN I (HIGH SENSITIVITY)    EKG None  Radiology DG Chest 2 View Result Date: 09/14/2023 CLINICAL DATA:  Left chest and arm pain for 2 weeks. Shortness of breath. Hypoxia. EXAM: CHEST - 2 VIEW COMPARISON:  03/29/2022 FINDINGS: The heart size  and mediastinal contours are within normal limits. Changes of COPD again noted. No acute infiltrate, edema, or pleural effusion IMPRESSION: COPD. No active cardiopulmonary disease. Electronically Signed   By: Danae Orleans M.D.   On: 09/14/2023 10:47    Procedures Procedures    Medications Ordered in ED Medications  ipratropium-albuterol (DUONEB) 0.5-2.5 (3) MG/3ML nebulizer solution 3 mL (3 mLs Nebulization Given 09/14/23 1009)  ipratropium-albuterol (DUONEB) 0.5-2.5 (3) MG/3ML nebulizer solution 3 mL (3 mLs Nebulization Given 09/14/23 1009)  methylPREDNISolone sodium succinate (SOLU-MEDROL) 125 mg/2 mL injection 125 mg (125 mg Intravenous Given 09/14/23 1010)  acetaminophen (TYLENOL) tablet 650 mg (650 mg Oral Given 09/14/23 1018)  ipratropium-albuterol (DUONEB) 0.5-2.5 (3) MG/3ML nebulizer solution 3 mL (3 mLs Nebulization Given 09/14/23 1343)  amoxicillin-clavulanate (AUGMENTIN) 875-125 MG per tablet 1 tablet (1 tablet Oral Given 09/14/23 1343)    ED Course/ Medical Decision Making/ A&P Clinical Course as of 09/14/23 1346  Sun Sep 14, 2023  4098 With history of COPD and lung cancer and A-fib on warfarin here for increasing cough and shortness of breath, having DOE.  No chest pain.  Does have left arm pain that is constant and has been an ongoing issue but is not exertional, mention a last radiology note from 12/4 not felt to be cardiac at that time.  He is having some wheezing and mild increased work of breathing, satting 98% on 2 L.  Will give DuoNebs and steroids for possible COPD exacerbation will obtain chest x-ray and  labs including INR due to being on warfarin.  Assuming INR is therapeutic, doubt PE [CB]  8127 72 year old male history of COPD here with increased shortness of breath.  Diminished breath sounds.  Workup fairly unrevealing but still symptomatic and requiring increased level of oxygen.  Would benefit from mission to the hospital for further management. [MB]    Clinical Course User Index [CB] Ma Rings, PA-C [MB] Terrilee Files, MD                                 Medical Decision Making This patient presents to the ED for concern of SOB, L arm pain, this involves an extensive number of treatment options, and is a complaint that carries with it a high risk of complications and morbidity.  The differential diagnosis includes COPD exacerbation, CHF, PNA, PE, ACS, bronchitis, URI, other   Co morbidities that complicate the patient evaluation : COPD, lung cancer, PAF   Additional history obtained:  Additional history obtained from EMR External records from outside source obtained and reviewed including prior notes, prior labs and imaging   Lab Tests:  I Ordered, and personally interpreted labs.  The pertinent results include: Normal BNP, troponin negative x 2, INR therapeutic at 2.1, BMP is normal, CBC is normal.   Imaging Studies ordered:  I ordered imaging studies including chest x-ray which shows changes of COPD but no infiltrate or pulmonary edema I independently visualized and interpreted imaging within scope of identifying emergent findings  I agree with the radiologist interpretation   Cardiac Monitoring: / EKG:  The patient was maintained on a cardiac monitor.  I personally viewed and interpreted the cardiac monitored which showed an underlying rhythm of: sinus rhythm   Consultations Obtained:  I requested consultation with the hospitalist, Dr. Sherryll Burger,  and discussed lab and imaging findings as well as pertinent plan - they recommend: admission   Problem List /  ED Course / Critical interventions / Medication management  COPD exacerbation-patient having cough with increased sputum production and increased shortness of breath and wheezing at home for the past several days.  He has been doing his DuoNebs at home and using his oxygen she has prescribed 2 L as needed without relief.  Noted to be wheezing on exam, he improved with 2 DuoNebs and steroids, was satting 94-95 on the 2 L and plan was for discharge home if he was able to ambulate on his 2 L.  RN reported that he got extremely dyspneic with walking, oxygen dropped into the 80s, she got back into bed and had to put him on 4 L his oxygen back up into the 90s.  Discussed with patient due creased oxygen requirement will admit him to the hospital.  He is agreeable with this. I ordered medication including DuoNebs, Solu-Medrol for wheezing, shortness of breath Reevaluation of the patient after these medicines showed that the patient improved I have reviewed the patients home medicines and have made adjustments as needed   Social Determinants of Health:  Patient lives with wife       Amount and/or Complexity of Data Reviewed Labs: ordered. Radiology: ordered.  Risk OTC drugs. Prescription drug management. Decision regarding hospitalization.           Final Clinical Impression(s) / ED Diagnoses Final diagnoses:  COPD exacerbation [facility])    Rx / DC Orders ED Discharge Orders     None         Josem Kaufmann 09/14/23 1346    Terrilee Files, MD 09/14/23 1740

## 2023-09-14 NOTE — ED Triage Notes (Signed)
Pt complains of left arm pain for 2 weeks and SOB for a long period of time. PCP prescribed duonebs and pt states symptoms are worse now. Pt place on O2 at 2 l/min via n/c due to sats 89-90% when pt arrived. Hx of lung ca and COPD. Pt also states he has mass in RUL.

## 2023-09-15 DIAGNOSIS — N401 Enlarged prostate with lower urinary tract symptoms: Secondary | ICD-10-CM

## 2023-09-15 DIAGNOSIS — I10 Essential (primary) hypertension: Secondary | ICD-10-CM | POA: Diagnosis not present

## 2023-09-15 DIAGNOSIS — I480 Paroxysmal atrial fibrillation: Secondary | ICD-10-CM

## 2023-09-15 DIAGNOSIS — R351 Nocturia: Secondary | ICD-10-CM

## 2023-09-15 DIAGNOSIS — J9611 Chronic respiratory failure with hypoxia: Secondary | ICD-10-CM

## 2023-09-15 DIAGNOSIS — E782 Mixed hyperlipidemia: Secondary | ICD-10-CM | POA: Diagnosis not present

## 2023-09-15 DIAGNOSIS — C3491 Malignant neoplasm of unspecified part of right bronchus or lung: Secondary | ICD-10-CM | POA: Diagnosis not present

## 2023-09-15 DIAGNOSIS — J441 Chronic obstructive pulmonary disease with (acute) exacerbation: Secondary | ICD-10-CM | POA: Diagnosis not present

## 2023-09-15 LAB — BASIC METABOLIC PANEL
Anion gap: 13 (ref 5–15)
BUN: 28 mg/dL — ABNORMAL HIGH (ref 8–23)
CO2: 23 mmol/L (ref 22–32)
Calcium: 9.3 mg/dL (ref 8.9–10.3)
Chloride: 100 mmol/L (ref 98–111)
Creatinine, Ser: 1.28 mg/dL — ABNORMAL HIGH (ref 0.61–1.24)
GFR, Estimated: 59 mL/min — ABNORMAL LOW (ref >60)
Glucose, Bld: 242 mg/dL — ABNORMAL HIGH (ref 70–99)
Potassium: 4.5 mmol/L (ref 3.5–5.1)
Sodium: 136 mmol/L (ref 135–145)

## 2023-09-15 LAB — GLUCOSE, CAPILLARY: Glucose-Capillary: 265 mg/dL — ABNORMAL HIGH (ref 70–99)

## 2023-09-15 LAB — CBC
HCT: 43.9 % (ref 39.0–52.0)
Hemoglobin: 13.6 g/dL (ref 13.0–17.0)
MCH: 27 pg (ref 26.0–34.0)
MCHC: 31 g/dL (ref 30.0–36.0)
MCV: 87.1 fL (ref 80.0–100.0)
Platelets: 205 10*3/uL (ref 150–400)
RBC: 5.04 MIL/uL (ref 4.22–5.81)
RDW: 14.7 % (ref 11.5–15.5)
WBC: 12.8 10*3/uL — ABNORMAL HIGH (ref 4.0–10.5)
nRBC: 0 % (ref 0.0–0.2)

## 2023-09-15 LAB — PROTIME-INR
INR: 2.4 — ABNORMAL HIGH (ref 0.8–1.2)
Prothrombin Time: 26.4 s — ABNORMAL HIGH (ref 11.4–15.2)

## 2023-09-15 LAB — MAGNESIUM: Magnesium: 2 mg/dL (ref 1.7–2.4)

## 2023-09-15 MED ORDER — ARFORMOTEROL TARTRATE 15 MCG/2ML IN NEBU
15.0000 ug | INHALATION_SOLUTION | Freq: Two times a day (BID) | RESPIRATORY_TRACT | Status: DC
  Administered 2023-09-15 – 2023-09-16 (×2): 15 ug via RESPIRATORY_TRACT
  Filled 2023-09-15 (×2): qty 2

## 2023-09-15 MED ORDER — DM-GUAIFENESIN ER 30-600 MG PO TB12
1.0000 | ORAL_TABLET | Freq: Two times a day (BID) | ORAL | Status: DC
  Administered 2023-09-15 – 2023-09-16 (×3): 1 via ORAL
  Filled 2023-09-15 (×3): qty 1

## 2023-09-15 MED ORDER — BUDESONIDE 0.5 MG/2ML IN SUSP
0.5000 mg | Freq: Two times a day (BID) | RESPIRATORY_TRACT | Status: DC
  Administered 2023-09-15 – 2023-09-16 (×2): 0.5 mg via RESPIRATORY_TRACT
  Filled 2023-09-15 (×2): qty 2

## 2023-09-15 MED ORDER — WARFARIN SODIUM 5 MG PO TABS
5.0000 mg | ORAL_TABLET | Freq: Once | ORAL | Status: AC
  Administered 2023-09-15: 5 mg via ORAL
  Filled 2023-09-15: qty 1

## 2023-09-15 NOTE — Progress Notes (Signed)
   09/15/23 1046  TOC Brief Assessment  Insurance and Status Reviewed  Patient has primary care physician Yes  Home environment has been reviewed Home with spouse  Prior level of function: need assistance  Prior/Current Home Services No current home services  Social Drivers of Health Review SDOH reviewed no interventions necessary  Readmission risk has been reviewed Yes  Transition of care needs no transition of care needs at this time    Transition of Care Department [facility]) has reviewed patient and no TOC needs have been identified at this time. We will continue to monitor patient advancement through interdisciplinary progression rounds. If new patient transition needs arise, please place a TOC consult.

## 2023-09-15 NOTE — Plan of Care (Signed)
  Problem: Education: Goal: Knowledge of General Education information will improve Description: Including pain rating scale, medication(s)/side effects and non-pharmacologic comfort measures Outcome: Progressing   Problem: Clinical Measurements: Goal: Respiratory complications will improve Outcome: Progressing   

## 2023-09-15 NOTE — Progress Notes (Signed)
Mobility Specialist Progress Note:    09/15/23 1000  Mobility  Activity Ambulated with assistance in hallway  Level of Assistance Standby assist, set-up cues, supervision of patient - no hands on  Assistive Device Cane  Distance Ambulated (ft) 70 ft  Range of Motion/Exercises Active;All extremities  Activity Response Tolerated well  Mobility Referral Yes  Mobility visit 1 Mobility  Mobility Specialist Start Time (ACUTE ONLY) 0950  Mobility Specialist Stop Time (ACUTE ONLY) 1010  Mobility Specialist Time Calculation (min) (ACUTE ONLY) 20 min   Pt received in bed, wife and NT in room. Agreeable to mobility, required CGA to stand and ambulate with cane. Tolerated well, pt shaky and SOB. SpO2 96% on 4L before ambulation. SpO2 86% on 4L during ambulation, recovered. SpO2 98% on 4L sitting EOB after session. Left pt in bed, NT in room. All needs met.   Lawerance Bach Mobility Specialist Please contact via Special educational needs teacher or  Rehab office at [phone]

## 2023-09-15 NOTE — Progress Notes (Signed)
PHARMACY - ANTICOAGULATION CONSULT NOTE  Pharmacy Consult for warfarin Indication: atrial fibrillation  No Known Allergies  Patient Measurements: Height: 5\' 10"  (177.8 cm) Weight: 100.2 kg (221 lb) IBW/kg (Calculated) : 73 Heparin Dosing Weight:   Vital Signs: Temp: 98.4 F (36.9 C) (12/16 0358) Temp Source: Oral (12/16 0358) BP: 131/65 (12/16 0358) Pulse Rate: 90 (12/16 0358)  Labs: Recent Labs    09/14/23 0936 09/14/23 0949 09/14/23 1134 09/15/23 0423  HGB 15.1  --   --  13.6  HCT 47.5  --   --  43.9  PLT 220  --   --  205  LABPROT  --  24.2*  --  26.4*  INR  --  2.1*  --  2.4*  CREATININE 1.14  --   --  1.28*  TROPONINIHS 6  --  6  --     Estimated Creatinine Clearance: 61 mL/min (A) (by C-G formula based on SCr of 1.28 mg/dL (H)).   Medical History: Past Medical History:  Diagnosis Date   A-fib (HCC) 2017   after last lung surgery patient had a history if Afib documented in hospital    Acute appendicitis 07/14/2021   Arthritis    back & legs ( knees)   Cancer (HCC)    lung   Complication of anesthesia    agitated upon waking from anesth.    Emphysema    Emphysema of lung (HCC)    Hyperlipidemia    Hypertension    OSA (obstructive sleep apnea) 11/25/2022   Oxygen dependent    2L continuous   Stroke (HCC)    Tobacco abuse     Medications:  Medications Prior to Admission  Medication Sig Dispense Refill Last Dose/Taking   acetaminophen (TYLENOL) 325 MG tablet Take 2 tablets (650 mg total) by mouth every 6 (six) hours as needed for mild pain (or Fever >/= 101).   09/12/2023 Noon   cetirizine (ZYRTEC) 10 MG tablet Take 10 mg by mouth daily.   09/14/2023 Morning   Cholecalciferol (VITAMIN D3) 125 MCG (5000 UT) CAPS Take 1 capsule (5,000 Units total) by mouth daily. 30 capsule 0 09/14/2023 Morning   diltiazem (CARDIZEM CD) 180 MG 24 hr capsule Take 1 capsule (180 mg total) by mouth daily. 90 capsule 3 09/14/2023 Morning   fenofibrate (TRICOR) 145 MG  tablet Take 1 tablet (145 mg total) by mouth daily. 90 tablet 1 09/14/2023 Morning   finasteride (PROSCAR) 5 MG tablet Take 1 tablet (5 mg total) by mouth daily. 30 tablet 3 09/13/2023 Evening   ipratropium-albuterol (DUONEB) 0.5-2.5 (3) MG/3ML SOLN USE 1 AMPULE IN NEBULIZER 4 TIMES DAILY AND AS NEEDED FOR SHORTNESS OF BREATH AND FOR WHEEZING 450 mL 0 09/14/2023 at  7:00 AM   Omega-3 Fatty Acids (FISH OIL ULTRA) 1400 MG CAPS Take 1 capsule by mouth in the morning and at bedtime.   09/14/2023 Morning   OXYGEN Inhale 2 L into the lungs continuous. Hx of lung ca, copd, and emphysema   09/14/2023 Morning   tamsulosin (FLOMAX) 0.4 MG CAPS capsule Take 2 capsules (0.8 mg total) by mouth daily. 180 capsule 3 09/14/2023 Morning   warfarin (COUMADIN) 5 MG tablet Take 1 tablet (5 mg total) by mouth daily. (Patient taking differently: Take 5 mg by mouth daily. 0.5 tab on Tuesday and Friday) 90 tablet 1 09/13/2023 at  5:00 PM   rosuvastatin (CRESTOR) 40 MG tablet Take 1 tablet (40 mg total) by mouth daily. (Patient not taking: Reported on 09/14/2023)  90 tablet 3 Not Taking    Assessment: Pharmacy consulted to dose warfarin for atrial fibrillation. INR on admission is therapeutic at 2.1.  Home dose listed as 2.5 mg on Tue + Fri and 5 mg ROW with last dose 12/14 @ 1700.  Goal of Therapy:  INR 2-3 Monitor platelets by anticoagulation protocol: Yes   New drug-drug Interactions: Doxycycline >> increased risk of bleeding  Plan:  Warfarin 5 mg x 1 dose. Monitor daily INR and s/sx of bleeding.  Will M. Dareen Piano, PharmD Clinical Pharmacist 09/15/2023 8:06 AM

## 2023-09-15 NOTE — Plan of Care (Signed)
  Problem: Education: Goal: Knowledge of General Education information will improve Description Including pain rating scale, medication(s)/side effects and non-pharmacologic comfort measures Outcome: Progressing   Problem: Education: Goal: Knowledge of General Education information will improve Description Including pain rating scale, medication(s)/side effects and non-pharmacologic comfort measures Outcome: Progressing   

## 2023-09-15 NOTE — Progress Notes (Signed)
Progress Note   Patient: Randall Sullivan UEA:[id number] DOB: 07-24-1951 DOA: 09/14/2023     1 DOS: the patient was seen and examined on 09/15/2023   Brief hospital admission course: As per H&P written by Dr. Sherryll Burger on 09/14/2023 Randall Sullivan is a 72 y.o. male with medical history significant for COPD with chronic hypoxemia, prior tobacco abuse, lung cancer with prior lobectomy, paroxysmal atrial fibrillation on warfarin, dyslipidemia, and hypertension who presented to the ED with worsening shortness of breath as well as increasing cough with sputum production over the last 2 days.  He states that he has home oxygen to use as needed, but has been needing it more frequently over the last 2 days.  He denies any chest pain, fevers, or chills and has been taking his medications routinely.  He denies any sick contacts.  He also complains of some left arm achiness, but this appears to be a chronic problem.  He denies any current tobacco abuse or alcohol use.   ED Course: Vital signs stable and patient afebrile.  He arrived with adequate oxygenation on his baseline 2 L nasal cannula oxygen.  INR 2.1 and chest x-ray with findings of COPD and no pneumonia.  He has been given breathing treatment and IV steroids with minimal improvement.  Assessment and Plan: 1-acute on chronic hypoxemia -Patient requiring 4 L nasal cannula supplementation to maintain saturations at time of admission (chronically on 2 L) -Condition appears to be secondary to acute COPD exacerbation and bronchiectasis -Continue treatment with the steroids, nebulizer management, mucolytic's, the use of flutter valve and supportive care -Oral doxycycline has been added as part of treatment for bronchiectasis -Continue to follow clinical response. -Patient reports feeling better and noticing decrease in his coughing spells.  Still short winded with activity.  2-COPD exacerbation -As mentioned above we will continue treatment with nebulizer  management, oral doxycycline, steroids and oxygen supplementation.  3-hypertension -Vital signs well-controlled with the use of Cardizem -Continue heart healthy diet.  4-makes hyperlipidemia -Continue Crestor -Heart healthy diet discussed with patient.  5-history of BPH -Continue treatment with Flomax and Proscar; no complaints of urinary retention currently present.  6-paroxysmal atrial fibrillation -Currently stable and demonstrating good rate control -Continue telemetry monitoring -Continue Cardizem and the use of Coumadin for secondary prevention -Pharmacy to assist with INR levels.  7-history of lung cancer -Continue patient follow-up with oncology service -Patient is status post lobectomy.  8-class I obesity -Body mass index is 31.71 kg/m.  -Low-calorie diet, portion control and increase activity discussed with patient.  9-insomnia -Continue as needed trazodone.  10-hyperglycemia/leukocytosis -In the setting of his steroid usage -Continue supportive care and follow WBCs and CBGs trend.  Subjective:  Breathing slightly better and noticing improvement in his productive coughing spell.  Still short winded with activity.  Requiring around 3 L nasal cannula supplementation to maintain saturation.  Patient is afebrile.  Physical Exam: Vitals:   09/15/23 0358 09/15/23 0845 09/15/23 1258 09/15/23 1428  BP: 131/65 (!) 120/51  (!) 119/53  Pulse: 90 91  89  Resp: 20     Temp: 98.4 F (36.9 C) 97.7 F (36.5 C)    TempSrc: Oral Oral    SpO2: 100% 99% 98% 99%  Weight:      Height:       General exam: Alert, awake, oriented x 3; following commands appropriately.  No fever.  Mild difficulty speaking in full sentences. Respiratory system: Positive rhonchi bilaterally, no using accessory muscles; also with appreciated expiratory  wheezing on exam. Cardiovascular system: Rate controlled, no rubs, no gallops, no JVD. Gastrointestinal system: Abdomen is obese, nondistended,  soft and nontender. No organomegaly or masses felt. Normal bowel sounds heard. Central nervous system:  No focal neurological deficits. Extremities: No cyanosis or clubbing.   Skin: No petechiae. Psychiatry: Judgement and insight appear normal.  Flat affect.  Data Reviewed: Magnesium: 2.0 Basic metabolic panel: Sodium 136, potassium 4.5, chloride 100, bicarb 23, BUN 28, creatinine 1.2 and GFR 59 CBC: WBCs 12.8, hemoglobin 13.6 and platelet count 205K Pro time-INR: 26.4/2.4 respectively.  Family Communication: No family at bedside.  Disposition: Status is: Inpatient Remains inpatient appropriate because: Continue treatment with his steroids and nebulizer management.   Planned Discharge Destination: Home  Time spent: 50 minutes  Author: Vassie Loll, MD 09/15/2023 6:16 PM  For on call review [email or web address].

## 2023-09-15 NOTE — Progress Notes (Deleted)
Patient is requesting to have family POC [place] [facility] contacted  @ [phone] 8a-10a eastern time to provide update re: results of [place] and POC . Patient would also like to have cardiologist Dr. Anne Fu to round with patient tomorrow if possible.

## 2023-09-16 ENCOUNTER — Telehealth: Payer: Self-pay | Admitting: Internal Medicine

## 2023-09-16 DIAGNOSIS — I10 Essential (primary) hypertension: Secondary | ICD-10-CM | POA: Diagnosis not present

## 2023-09-16 DIAGNOSIS — E1169 Type 2 diabetes mellitus with other specified complication: Secondary | ICD-10-CM | POA: Diagnosis not present

## 2023-09-16 DIAGNOSIS — R42 Dizziness and giddiness: Secondary | ICD-10-CM

## 2023-09-16 DIAGNOSIS — J441 Chronic obstructive pulmonary disease with (acute) exacerbation: Secondary | ICD-10-CM | POA: Diagnosis not present

## 2023-09-16 DIAGNOSIS — E782 Mixed hyperlipidemia: Secondary | ICD-10-CM | POA: Diagnosis not present

## 2023-09-16 DIAGNOSIS — N401 Enlarged prostate with lower urinary tract symptoms: Secondary | ICD-10-CM | POA: Diagnosis not present

## 2023-09-16 DIAGNOSIS — I480 Paroxysmal atrial fibrillation: Secondary | ICD-10-CM | POA: Diagnosis not present

## 2023-09-16 DIAGNOSIS — R351 Nocturia: Secondary | ICD-10-CM | POA: Diagnosis not present

## 2023-09-16 DIAGNOSIS — G4700 Insomnia, unspecified: Secondary | ICD-10-CM

## 2023-09-16 DIAGNOSIS — J9611 Chronic respiratory failure with hypoxia: Secondary | ICD-10-CM | POA: Diagnosis not present

## 2023-09-16 DIAGNOSIS — C3491 Malignant neoplasm of unspecified part of right bronchus or lung: Secondary | ICD-10-CM | POA: Diagnosis not present

## 2023-09-16 LAB — PROTIME-INR
INR: 3.1 — ABNORMAL HIGH (ref 0.8–1.2)
Prothrombin Time: 32.3 s — ABNORMAL HIGH (ref 11.4–15.2)

## 2023-09-16 MED ORDER — MECLIZINE HCL 25 MG PO TABS: 25.0000 mg | ORAL_TABLET | Freq: Three times a day (TID) | ORAL | 0 refills | Status: DC | PRN

## 2023-09-16 MED ORDER — POLYETHYLENE GLYCOL 3350 17 G PO PACK: 17.0000 g | PACK | Freq: Every day | ORAL | Status: DC | PRN

## 2023-09-16 MED ORDER — TRAZODONE HCL 100 MG PO TABS: 100.0000 mg | ORAL_TABLET | Freq: Every day | ORAL | 1 refills | Status: DC

## 2023-09-16 MED ORDER — PREDNISONE 20 MG PO TABS: ORAL_TABLET | ORAL | 0 refills | Status: DC

## 2023-09-16 MED ORDER — DM-GUAIFENESIN ER 30-600 MG PO TB12: 1.0000 | ORAL_TABLET | Freq: Two times a day (BID) | ORAL | 0 refills | Status: AC

## 2023-09-16 MED ORDER — METOPROLOL SUCCINATE ER 50 MG PO TB24: 50.0000 mg | ORAL_TABLET | Freq: Every day | ORAL | 2 refills | Status: DC

## 2023-09-16 MED ORDER — BUDESONIDE-FORMOTEROL FUMARATE 160-4.5 MCG/ACT IN AERO: 2.0000 | INHALATION_SPRAY | Freq: Two times a day (BID) | RESPIRATORY_TRACT | 12 refills | Status: DC

## 2023-09-16 MED ORDER — PANTOPRAZOLE SODIUM 40 MG PO TBEC: 40.0000 mg | DELAYED_RELEASE_TABLET | Freq: Every day | ORAL | 1 refills | Status: DC

## 2023-09-16 MED ORDER — DOXYCYCLINE HYCLATE 100 MG PO TABS: 100.0000 mg | ORAL_TABLET | Freq: Two times a day (BID) | ORAL | 0 refills | Status: DC

## 2023-09-16 NOTE — Telephone Encounter (Signed)
 Copied from CRM [id number]. Topic: General - Other >> Sep 16, 2023  8:09 AM Desma Mcgregor wrote: Reason for CRM: [name] (spouse) is wanting to let the doctor know that the Pt is currently across the street in the hospital and has been since Sunday. She is also wanting to speak to someone about filling out her FMLA papers for her work. Please contact her at [phone]

## 2023-09-16 NOTE — Progress Notes (Signed)
Mobility Specialist Progress Note:    09/16/23 1154  Mobility  Activity Ambulated with assistance in room;Ambulated with assistance in hallway  Level of Assistance Standby assist, set-up cues, supervision of patient - no hands on  Assistive Device None  Distance Ambulated (ft) 30 ft  Range of Motion/Exercises Active;All extremities  Activity Response Tolerated well  Mobility Referral Yes  Mobility visit 1 Mobility  Mobility Specialist Start Time (ACUTE ONLY) 1140  Mobility Specialist Stop Time (ACUTE ONLY) 1155  Mobility Specialist Time Calculation (min) (ACUTE ONLY) 15 min   Pt received sitting EOB, agreeable to mobility. Required SBA to stand and ambulate with no AD. Tolerated well, SpO2 92% on 3L throughout. Returned pt sitting EOB, all needs met.   Lawerance Bach Mobility Specialist Please contact via Special educational needs teacher or  Rehab office at [phone]

## 2023-09-16 NOTE — Discharge Summary (Signed)
Physician Discharge Summary   Patient: Randall Sullivan MRN: [id number] DOB: March 31, 1951  Admit date:     09/14/2023  Discharge date: 09/16/23  Discharge Physician: Vassie Loll   PCP: Anabel Halon, MD   Recommendations at discharge:  Repeat CBC to follow hemoglobin trend/stability Repeat basic metabolic panel to follow electrolytes and renal function Reassess blood pressure and adjust antihypertensive treatment as needed.  Discharge Diagnoses: Active Problems:   Primary lung adenocarcinoma (HCC)   Essential (primary) hypertension   PAF (paroxysmal atrial fibrillation) (HCC)   Hyperlipidemia   Benign prostatic hyperplasia with nocturia   Diabetes mellitus (HCC)   History of CVA with residual deficit   Chronic respiratory failure with hypoxia (HCC)   COPD exacerbation (HCC)  Brief hospital admission course: As per H&P written by Dr. Sherryll Burger on 09/14/2023 Randall Sullivan is a 72 y.o. male with medical history significant for COPD with chronic hypoxemia, prior tobacco abuse, lung cancer with prior lobectomy, paroxysmal atrial fibrillation on warfarin, dyslipidemia, and hypertension who presented to the ED with worsening shortness of breath as well as increasing cough with sputum production over the last 2 days.  He states that he has home oxygen to use as needed, but has been needing it more frequently over the last 2 days.  He denies any chest pain, fevers, or chills and has been taking his medications routinely.  He denies any sick contacts.  He also complains of some left arm achiness, but this appears to be a chronic problem.  He denies any current tobacco abuse or alcohol use.   ED Course: Vital signs stable and patient afebrile.  He arrived with adequate oxygenation on his baseline 2 L nasal cannula oxygen.  INR 2.1 and chest x-ray with findings of COPD and no pneumonia.  He has been given breathing treatment and IV steroids with minimal improvement.  Assessment and Plan: 1-acute on  chronic hypoxemia -Patient requiring 4 L nasal cannula supplementation to maintain saturations at time of admission (chronically on 2 L) -Condition appears to be secondary to acute COPD exacerbation and bronchiectasis -Continue treatment with the steroids, bronchodilator management mucolytic's, the use of flutter valve and supportive care -Complete treatment with oral doxycycline for bronchiectasis component. -Overall improved and ready to go home at time of discharge.  Using 3 L nasal cannula supplementation with good saturation and demonstrating ability to speak in full sentences. -Patient reports feeling better and noticing decrease in his coughing spells.   2-COPD exacerbation -As mentioned above we will continue treatment with bronchodilators management, oral doxycycline, steroids and oxygen supplementation (with goal to wean down to patient's chronic baseline as tolerated; at discharge using 3 L)..   3-hypertension -Vital signs well-controlled with the use of Cardizem and metoprolol -Continue heart healthy diet. -Reassess blood pressure at follow-up visit and adjust antihypertensive regimen as required.   4-makes hyperlipidemia -Continue Crestor -Heart healthy diet discussed with patient.   5-history of BPH -Continue treatment with Flomax and Proscar; no complaints of urinary retention currently present.   6-paroxysmal atrial fibrillation -Currently stable and demonstrating good rate control -Continue telemetry monitoring -Continue Cardizem, metoprolol and the use of Coumadin for secondary prevention -INR remained therapeutic throughout hospitalization.   7-history of lung cancer -Continue patient follow-up with oncology service -Patient is status post lobectomy.   8-class I obesity -Body mass index is 31.71 kg/m.  -Low-calorie diet, portion control and increase activity discussed with patient.   9-insomnia -Continue as needed trazodone.   10-leukocytosis -In the  setting of steroid usage -Repeat CBC at follow-up visit to assess WBCs trend/stability.  11-prediabetes -Continue to follow CBGs/A1c as an outpatient. -Most recent A1c 6.1 -Currently no using hypoglycemic agents. -Maintain adequate hydration and continue to follow modified carbohydrate diet.  Consultants: None Procedures performed: See below for x-ray report. Disposition: Home Diet recommendation: Heart healthy/modified carbohydrate diet.  DISCHARGE MEDICATION: Allergies as of 09/16/2023   No Known Allergies      Medication List     TAKE these medications    acetaminophen 325 MG tablet Commonly known as: TYLENOL Take 2 tablets (650 mg total) by mouth every 6 (six) hours as needed for mild pain (or Fever >/= 101).   budesonide-formoterol 160-4.5 MCG/ACT inhaler Commonly known as: Symbicort Inhale 2 puffs into the lungs in the morning and at bedtime.   cetirizine 10 MG tablet Commonly known as: ZYRTEC Take 10 mg by mouth daily.   dextromethorphan-guaiFENesin 30-600 MG 12hr tablet Commonly known as: MUCINEX DM Take 1 tablet by mouth 2 (two) times daily for 10 days.   diltiazem 180 MG 24 hr capsule Commonly known as: Cardizem CD Take 1 capsule (180 mg total) by mouth daily.   doxycycline 100 MG tablet Commonly known as: VIBRA-TABS Take 1 tablet (100 mg total) by mouth every 12 (twelve) hours for 3 days.   fenofibrate 145 MG tablet Commonly known as: TRICOR Take 1 tablet (145 mg total) by mouth daily.   finasteride 5 MG tablet Commonly known as: Proscar Take 1 tablet (5 mg total) by mouth daily.   Fish Oil Ultra 1400 MG Caps Take 1 capsule by mouth in the morning and at bedtime.   ipratropium-albuterol 0.5-2.5 (3) MG/3ML Soln Commonly known as: DUONEB USE 1 AMPULE IN NEBULIZER 4 TIMES DAILY AND AS NEEDED FOR SHORTNESS OF BREATH AND FOR WHEEZING   meclizine 25 MG tablet Commonly known as: ANTIVERT Take 1 tablet (25 mg total) by mouth 3 (three) times daily  as needed for dizziness.   metoprolol succinate 50 MG 24 hr tablet Commonly known as: Toprol XL Take 1 tablet (50 mg total) by mouth daily. Take with or immediately following a meal.   OXYGEN Inhale 2 L into the lungs continuous. Hx of lung ca, copd, and emphysema   pantoprazole 40 MG tablet Commonly known as: Protonix Take 1 tablet (40 mg total) by mouth daily.   polyethylene glycol 17 g packet Commonly known as: MIRALAX / GLYCOLAX Take 17 g by mouth daily as needed for mild constipation.   predniSONE 20 MG tablet Commonly known as: DELTASONE 1 day; then 2 tablets by mouth daily x 2 days; then 1 tablet by mouth daily x 3 days; then half tablet by mouth daily x 3 days and stop prednisone.   rosuvastatin 40 MG tablet Commonly known as: CRESTOR Take 1 tablet (40 mg total) by mouth daily.   tamsulosin 0.4 MG Caps capsule Commonly known as: FLOMAX Take 2 capsules (0.8 mg total) by mouth daily.   traZODone 100 MG tablet Commonly known as: DESYREL Take 1 tablet (100 mg total) by mouth at bedtime.   Vitamin D3 125 MCG (5000 UT) Caps Take 1 capsule (5,000 Units total) by mouth daily.   warfarin 5 MG tablet Commonly known as: COUMADIN Take as directed. If you are unsure how to take this medication, talk to your nurse or doctor. Original instructions: Take 1 tablet (5 mg total) by mouth daily. What changed: additional instructions        Follow-up Information  Anabel Halon, MD. Schedule an appointment as soon as possible for a visit in 10 day(s).   Specialty: Internal Medicine Contact information: [address] [place] [address] [phone]                Discharge Exam: Ceasar Mons Weights   09/14/23 0917  Weight: 100.2 kg   General exam: Alert, awake, oriented x 3; speaking in full sentences and having just mild expiratory wheezing on exam. Respiratory system: Improved air movement bilaterally; using 2-3 L nasal cannula supplementation to maintain  saturation. Cardiovascular system:RRR.  No rubs, no gallops, no JVD. Gastrointestinal system: Abdomen is obese, nondistended, soft and nontender. No organomegaly or masses felt. Normal bowel sounds heard. Central nervous system: No focal neurological deficits. Extremities: No cyanosis or clubbing. Skin: No petechiae. Psychiatry: Judgement and insight appear normal. Mood & affect appropriate.    Condition at discharge: Stable and improved.  The results of significant diagnostics from this hospitalization (including imaging, microbiology, ancillary and laboratory) are listed below for reference.   Imaging Studies: DG Chest 2 View Result Date: 09/14/2023 CLINICAL DATA:  Left chest and arm pain for 2 weeks. Shortness of breath. Hypoxia. EXAM: CHEST - 2 VIEW COMPARISON:  03/29/2022 FINDINGS: The heart size and mediastinal contours are within normal limits. Changes of COPD again noted. No acute infiltrate, edema, or pleural effusion IMPRESSION: COPD. No active cardiopulmonary disease. Electronically Signed   By: Danae Orleans M.D.   On: 09/14/2023 10:47   NM PET Image Restage (PS) Skull Base to Thigh (F-18 FDG) Result Date: 09/06/2023 CLINICAL DATA:  Subsequent treatment strategy for non-small cell lung cancer. EXAM: NUCLEAR MEDICINE PET SKULL BASE TO THIGH TECHNIQUE: 11.1 mCi F-18 FDG was injected intravenously. Full-ring PET imaging was performed from the skull base to thigh after the radiotracer. CT data was obtained and used for attenuation correction and anatomic localization. Fasting blood glucose:  mg/dl COMPARISON:  CT chest 41/66/0630 and CT abdomen pelvis 07/14/2021. PET 10/15/2018. FINDINGS: Mediastinal blood pool activity: SUV max 2.4 Liver activity: SUV max NA NECK: Misregistration artifact.  No definite abnormal hypermetabolism. Incidental CT findings: Left occipital lobe encephalomalacia. CHEST: Again, misregistration artifact degrades image quality hypermetabolic soft tissue nodule in the  central right upper lobe, associated with a suture line, measures 2.2 x 2.6 cm (607/27), SUV max 9.4. Faint 6 mm nodule in the superior segment right lower lobe (607/32), too small for PET resolution. No additional abnormal hypermetabolism. Incidental CT findings: Atherosclerotic calcification of the aorta, aortic valve and coronary arteries. Heart is at the upper limits of normal in size to mildly enlarged. No pericardial or pleural effusion. Left upper lobectomy. Right upper lobe wedge resection. Centrilobular and paraseptal emphysema. ABDOMEN/PELVIS: Again, wrist registration artifact degrades image quality. 1.5 cm fat density left adrenal myelolipoma. No follow-up necessary. Focal hypermetabolism in the central prostate, SUV max 6.5. No additional abnormal hypermetabolism. Incidental CT findings: Liver is decreased in attenuation diffusely. Tiny left renal stone. Slightly elevated left hemidiaphragm. Small bilateral inguinal hernias contain fat. SKELETON: No abnormal hypermetabolism. Incidental CT findings: Degenerative changes in the spine. IMPRESSION: 1. Misregistration artifact degrades image quality. 2. Recurrent bronchogenic carcinoma in the medial right upper lobe, along the resection margin, as suspected on CT chest 07/08/2023. No evidence of distant metastatic disease. 3. Faint 6 mm nodule in the superior segment right lower lobe, too small for PET resolution. Recommend continued attention on follow-up. 4. Focal hypermetabolism in the central prostate. Please correlate clinically as malignancy cannot be definitively  excluded. 5. Hepatic steatosis. 6. Tiny left renal stone. 7. Left adrenal myelolipoma. 8. Aortic atherosclerosis (ICD10-I70.0). Coronary artery calcification. 9.  Emphysema (ICD10-J43.9). Electronically Signed   By: Leanna Battles M.D.   On: 09/06/2023 15:55    Microbiology: Results for orders placed or performed during the hospital encounter of 09/14/23  Resp panel by RT-PCR (RSV, Flu  A&B, Covid) Anterior Nasal Swab     Status: None   Collection Time: 09/14/23 10:30 AM   Specimen: Anterior Nasal Swab  Result Value Ref Range Status   SARS Coronavirus 2 by RT PCR NEGATIVE NEGATIVE Final    Comment: (NOTE) SARS-CoV-2 target nucleic acids are NOT DETECTED.  The SARS-CoV-2 RNA is generally detectable in upper respiratory specimens during the acute phase of infection. The lowest concentration of SARS-CoV-2 viral copies this assay can detect is 138 copies/mL. A negative result does not preclude SARS-Cov-2 infection and should not be used as the sole basis for treatment or other patient management decisions. A negative result may occur with  improper specimen collection/handling, submission of specimen other than nasopharyngeal swab, presence of viral mutation(s) within the areas targeted by this assay, and inadequate number of viral copies(<138 copies/mL). A negative result must be combined with clinical observations, patient history, and epidemiological information. The expected result is Negative.  Fact Sheet for Patients:  BloggerCourse.com  Fact Sheet for Healthcare Providers:  SeriousBroker.it  This test is no t yet approved or cleared by the [place] FDA and  has been authorized for detection and/or diagnosis of SARS-CoV-2 by FDA under an Emergency Use Authorization (EUA). This EUA will remain  in effect (meaning this test can be used) for the duration of the COVID-19 declaration under Section 564(b)(1) of the Act, 21 U.S.C.section 360bbb-3(b)(1), unless the authorization is terminated  or revoked sooner.       Influenza A by PCR NEGATIVE NEGATIVE Final   Influenza B by PCR NEGATIVE NEGATIVE Final    Comment: (NOTE) The Xpert Xpress SARS-CoV-2/FLU/RSV plus assay is intended as an aid in the diagnosis of influenza from Nasopharyngeal swab specimens and should not be used as a sole basis for treatment.  Nasal washings and aspirates are unacceptable for Xpert Xpress SARS-CoV-2/FLU/RSV testing.  Fact Sheet for Patients: BloggerCourse.com  Fact Sheet for Healthcare Providers: SeriousBroker.it  This test is not yet approved or cleared by the [place] FDA and has been authorized for detection and/or diagnosis of SARS-CoV-2 by FDA under an Emergency Use Authorization (EUA). This EUA will remain in effect (meaning this test can be used) for the duration of the COVID-19 declaration under Section 564(b)(1) of the Act, 21 U.S.C. section 360bbb-3(b)(1), unless the authorization is terminated or revoked.     Resp Syncytial Virus by PCR NEGATIVE NEGATIVE Final    Comment: (NOTE) Fact Sheet for Patients: BloggerCourse.com  Fact Sheet for Healthcare Providers: SeriousBroker.it  This test is not yet approved or cleared by the [place] FDA and has been authorized for detection and/or diagnosis of SARS-CoV-2 by FDA under an Emergency Use Authorization (EUA). This EUA will remain in effect (meaning this test can be used) for the duration of the COVID-19 declaration under Section 564(b)(1) of the Act, 21 U.S.C. section 360bbb-3(b)(1), unless the authorization is terminated or revoked.  Performed at [facility], [address], [place], [place] [address]     Labs: CBC: Recent Labs  Lab 09/14/23 0936 09/15/23 0423  WBC 8.7 12.8*  NEUTROABS 6.4  --   HGB 15.1 13.6  HCT 47.5 43.9  MCV 86.2 87.1  PLT 220 205   Basic Metabolic Panel: Recent Labs  Lab 09/14/23 0936 09/15/23 0423  NA 137 136  K 4.3 4.5  CL 101 100  CO2 28 23  GLUCOSE 122* 242*  BUN 14 28*  CREATININE 1.14 1.28*  CALCIUM 9.7 9.3  MG  --  2.0   CBG: Recent Labs  Lab 09/15/23 1254  GLUCAP 265*    Discharge time spent: greater than 30 minutes.  Signed: Vassie Loll, MD [facility] 09/16/2023

## 2023-09-16 NOTE — Progress Notes (Signed)
Patient discharged with instructions given on medications and follow up visits,patient and family verbalized understanding. Prescriptions sent to Pharmacy of choice documented on AVS. IV  discontinued,catheter intact. Accompanied by staff to an awaiting vehicle. 

## 2023-09-16 NOTE — Care Management Important Message (Signed)
Important Message  Patient Details  Name: Randall Sullivan MRN: [id number] Date of Birth: [birth year]   Important Message Given:  N/A - LOS <3 / Initial given by admissions     Corey Harold 09/16/2023, 12:25 PM

## 2023-09-16 NOTE — Plan of Care (Signed)

## 2023-09-16 NOTE — Progress Notes (Signed)
PHARMACY - ANTICOAGULATION CONSULT NOTE  Pharmacy Consult for warfarin Indication: atrial fibrillation  No Known Allergies  Patient Measurements: Height: 5\' 10"  (177.8 cm) Weight: 100.2 kg (221 lb) IBW/kg (Calculated) : 73 Heparin Dosing Weight:   Vital Signs: Temp: 97.7 F (36.5 C) (12/17 0439) Temp Source: Oral (12/17 0439) BP: 166/81 (12/17 0439) Pulse Rate: 94 (12/17 0439)  Labs: Recent Labs    09/14/23 0936 09/14/23 0949 09/14/23 1134 09/15/23 0423 09/16/23 0425  HGB 15.1  --   --  13.6  --   HCT 47.5  --   --  43.9  --   PLT 220  --   --  205  --   LABPROT  --  24.2*  --  26.4* 32.3*  INR  --  2.1*  --  2.4* 3.1*  CREATININE 1.14  --   --  1.28*  --   TROPONINIHS 6  --  6  --   --     Estimated Creatinine Clearance: 61 mL/min (A) (by C-G formula based on SCr of 1.28 mg/dL (H)).   Medical History: Past Medical History:  Diagnosis Date   A-fib (HCC) 2017   after last lung surgery patient had a history if Afib documented in hospital    Acute appendicitis 07/14/2021   Arthritis    back & legs ( knees)   Cancer (HCC)    lung   Complication of anesthesia    agitated upon waking from anesth.    Emphysema    Emphysema of lung (HCC)    Hyperlipidemia    Hypertension    OSA (obstructive sleep apnea) 11/25/2022   Oxygen dependent    2L continuous   Stroke (HCC)    Tobacco abuse     Medications:  Medications Prior to Admission  Medication Sig Dispense Refill Last Dose/Taking   acetaminophen (TYLENOL) 325 MG tablet Take 2 tablets (650 mg total) by mouth every 6 (six) hours as needed for mild pain (or Fever >/= 101).   09/12/2023 Noon   cetirizine (ZYRTEC) 10 MG tablet Take 10 mg by mouth daily.   09/14/2023 Morning   Cholecalciferol (VITAMIN D3) 125 MCG (5000 UT) CAPS Take 1 capsule (5,000 Units total) by mouth daily. 30 capsule 0 09/14/2023 Morning   diltiazem (CARDIZEM CD) 180 MG 24 hr capsule Take 1 capsule (180 mg total) by mouth daily. 90 capsule 3  09/14/2023 Morning   fenofibrate (TRICOR) 145 MG tablet Take 1 tablet (145 mg total) by mouth daily. 90 tablet 1 09/14/2023 Morning   finasteride (PROSCAR) 5 MG tablet Take 1 tablet (5 mg total) by mouth daily. 30 tablet 3 09/13/2023 Evening   ipratropium-albuterol (DUONEB) 0.5-2.5 (3) MG/3ML SOLN USE 1 AMPULE IN NEBULIZER 4 TIMES DAILY AND AS NEEDED FOR SHORTNESS OF BREATH AND FOR WHEEZING 450 mL 0 09/14/2023 at  7:00 AM   Omega-3 Fatty Acids (FISH OIL ULTRA) 1400 MG CAPS Take 1 capsule by mouth in the morning and at bedtime.   09/14/2023 Morning   OXYGEN Inhale 2 L into the lungs continuous. Hx of lung ca, copd, and emphysema   09/14/2023 Morning   tamsulosin (FLOMAX) 0.4 MG CAPS capsule Take 2 capsules (0.8 mg total) by mouth daily. 180 capsule 3 09/14/2023 Morning   warfarin (COUMADIN) 5 MG tablet Take 1 tablet (5 mg total) by mouth daily. (Patient taking differently: Take 5 mg by mouth daily. 0.5 tab on Tuesday and Friday) 90 tablet 1 09/13/2023 at  5:00 PM   rosuvastatin (  CRESTOR) 40 MG tablet Take 1 tablet (40 mg total) by mouth daily. (Patient not taking: Reported on 09/14/2023) 90 tablet 3 Not Taking    Assessment: Pharmacy consulted to dose warfarin for atrial fibrillation. INR on admission is therapeutic at 2.1.  Home dose listed as 2.5 mg on Tue + Fri and 5 mg ROW with last dose 12/14 @ 1700.  INR 2.1 > 2.4 > 3.1  Goal of Therapy:  INR 2-3 Monitor platelets by anticoagulation protocol: Yes    Plan:  Hold warfarin x 1 dose. Monitor daily INR and s/s of bleeding   Judeth Cornfield, PharmD Clinical Pharmacist 09/16/2023 8:50 AM

## 2023-09-17 ENCOUNTER — Other Ambulatory Visit: Payer: Self-pay | Admitting: Internal Medicine

## 2023-09-17 ENCOUNTER — Telehealth: Payer: Self-pay | Admitting: Cardiology

## 2023-09-17 ENCOUNTER — Encounter: Payer: Self-pay | Admitting: Internal Medicine

## 2023-09-17 ENCOUNTER — Telehealth: Payer: Self-pay | Admitting: *Deleted

## 2023-09-17 ENCOUNTER — Telehealth: Payer: Self-pay

## 2023-09-17 ENCOUNTER — Encounter: Payer: Medicare Other | Admitting: Thoracic Surgery (Cardiothoracic Vascular Surgery)

## 2023-09-17 DIAGNOSIS — J449 Chronic obstructive pulmonary disease, unspecified: Secondary | ICD-10-CM

## 2023-09-17 MED ORDER — FLUTICASONE-SALMETEROL 100-50 MCG/ACT IN AEPB: 1.0000 | INHALATION_SPRAY | Freq: Two times a day (BID) | RESPIRATORY_TRACT | 3 refills | Status: DC

## 2023-09-17 NOTE — Transitions of Care (Post Inpatient/ED Visit) (Signed)
09/17/2023  Name: TZION MCADEN MRN: [id number] DOB: 01/15/1951  Today's TOC FU Call Status: Today's TOC FU Call Status:: Successful TOC FU Call Completed TOC FU Call Complete Date: 09/17/23 Patient's Name and Date of Birth confirmed.  Transition Care Management Follow-up Telephone Call Date of Discharge: 09/16/23 Type of Discharge: Inpatient Admission Primary Inpatient Discharge Diagnosis:: Primary lung adenocarcinoma How have you been since you were released from the hospital?: Better (states that his breathing is better) Any questions or concerns?: No  Items Reviewed: Did you receive and understand the discharge instructions provided?: Yes Medications obtained,verified, and reconciled?: Yes (Medications Reviewed) Any new allergies since your discharge?: No Dietary orders reviewed?: Yes Type of Diet Ordered:: low salt, heart health. maintain hydrated Do you have support at home?: Yes People in Home: spouse Name of Support/Comfort Primary Source: wife,   [name]  Medications Reviewed Today: Medications Reviewed Today     Reviewed by Earlie Server, RN (Registered Nurse) on 09/17/23 at [phone]  Med List Status: <None>   Medication Order Taking? Sig Documenting Provider Last Dose Status Informant  acetaminophen (TYLENOL) 325 MG tablet [id number] Yes Take 2 tablets (650 mg total) by mouth every 6 (six) hours as needed for mild pain (or Fever >/= 101). Charlton Amor, PA-C Taking Active Spouse/Significant Other  budesonide-formoterol (SYMBICORT) 160-4.5 MCG/ACT inhaler [id number] No Inhale 2 puffs into the lungs in the morning and at bedtime.  Patient not taking: Reported on 09/17/2023   Vassie Loll, MD Not Taking Active   cetirizine (ZYRTEC) 10 MG tablet [id number] Yes Take 10 mg by mouth daily. [provider] Taking Active Spouse/Significant Other  Cholecalciferol (VITAMIN D3) 125 MCG (5000 UT) CAPS [id number] Yes Take 1 capsule (5,000 Units total) by mouth daily.  Charlton Amor, PA-C Taking Active Spouse/Significant Other  dextromethorphan-guaiFENesin (MUCINEX DM) 30-600 MG 12hr tablet [id number] Yes Take 1 tablet by mouth 2 (two) times daily for 10 days. Vassie Loll, MD Taking Active   diltiazem (CARDIZEM CD) 180 MG 24 hr capsule [id number] Yes Take 1 capsule (180 mg total) by mouth daily. Anabel Halon, MD Taking Active Spouse/Significant Other  doxycycline (VIBRA-TABS) 100 MG tablet [id number] Yes Take 1 tablet (100 mg total) by mouth every 12 (twelve) hours for 3 days. Vassie Loll, MD Taking Active   fenofibrate (TRICOR) 145 MG tablet [id number] Yes Take 1 tablet (145 mg total) by mouth daily. Anabel Halon, MD Taking Active Spouse/Significant Other  finasteride (PROSCAR) 5 MG tablet [id number]  Take 1 tablet (5 mg total) by mouth daily. Anabel Halon, MD  Active Spouse/Significant Other  ipratropium-albuterol (DUONEB) 0.5-2.5 (3) MG/3ML SOLN [id number] Yes USE 1 AMPULE IN NEBULIZER 4 TIMES DAILY AND AS NEEDED FOR SHORTNESS OF BREATH AND FOR WHEEZING Anabel Halon, MD Taking Active Spouse/Significant Other  meclizine (ANTIVERT) 25 MG tablet [id number] No Take 1 tablet (25 mg total) by mouth 3 (three) times daily as needed for dizziness.  Patient not taking: Reported on 09/17/2023   Vassie Loll, MD Not Taking Active   metoprolol succinate (TOPROL XL) 50 MG 24 hr tablet [id number] Yes Take 1 tablet (50 mg total) by mouth daily. Take with or immediately following a meal. Vassie Loll, MD Taking Active   Omega-3 Fatty Acids (FISH OIL ULTRA) 1400 MG CAPS [id number] Yes Take 1 capsule by mouth in the morning and at bedtime. [provider] Taking Active Spouse/Significant Other  OXYGEN [id number] Yes Inhale 2 L into the lungs continuous. Hx of lung ca, copd,  and emphysema [provider] Taking Active Spouse/Significant Other  pantoprazole (PROTONIX) 40 MG tablet [id number] Yes Take 1 tablet (40 mg total) by mouth daily. Vassie Loll, MD Taking Active   polyethylene glycol (MIRALAX / GLYCOLAX) 17 g packet [id number] Yes Take 17 g by mouth daily as needed for mild constipation. Vassie Loll, MD Taking Active   predniSONE (DELTASONE) 20 MG tablet [id number] Yes 1 day; then 2 tablets by mouth daily x 2 days; then 1 tablet by mouth daily x 3 days; then half tablet by mouth daily x 3 days and stop prednisone. Vassie Loll, MD Taking Active   rosuvastatin (CRESTOR) 40 MG tablet [id number] No Take 1 tablet (40 mg total) by mouth daily.  Patient not taking: Reported on 09/14/2023   Rollene Rotunda, MD Not Taking Active Spouse/Significant Other           Med Note Elesa Massed, Johnnette Litter Sep 14, 2023  2:49 PM) Pt has not started this medication yet  tamsulosin (FLOMAX) 0.4 MG CAPS capsule [id number] Yes Take 2 capsules (0.8 mg total) by mouth daily. Anabel Halon, MD Taking Active Spouse/Significant Other  traZODone (DESYREL) 100 MG tablet [id number] No Take 1 tablet (100 mg total) by mouth at bedtime.  Patient not taking: Reported on 09/17/2023   Vassie Loll, MD Not Taking Active   warfarin (COUMADIN) 5 MG tablet [id number] Yes Take 1 tablet (5 mg total) by mouth daily.  Patient taking differently: Take 5 mg by mouth daily. 0.5 tab on Tuesday and Friday   Anabel Halon, MD Taking Active Spouse/Significant Other            Home Care and Equipment/Supplies: Were Home Health Services Ordered?: No Any new equipment or medical supplies ordered?: No  Functional Questionnaire: Do you need assistance with bathing/showering or dressing?: No Do you need assistance with meal preparation?: Yes (wife) Do you need assistance with eating?: No Do you have difficulty maintaining continence: No Do you need assistance with getting out of bed/getting out of a chair/moving?: No Do you have difficulty managing or taking your medications?: No  Follow up appointments reviewed: PCP Follow-up appointment confirmed?: Yes Date of PCP  follow-up appointment?: 09/26/23 Follow-up Provider: PCP Specialist Hospital Follow-up appointment confirmed?: Yes Date of Specialist follow-up appointment?: 10/06/23 Follow-Up Specialty Provider:: onocology Do you need transportation to your follow-up appointment?: No Do you understand care options if your condition(s) worsen?: Yes-patient verbalized understanding  SDOH Interventions Today    Flowsheet Row Most Recent Value  SDOH Interventions   Food Insecurity Interventions Intervention Not Indicated  Housing Interventions Intervention Not Indicated  Transportation Interventions Intervention Not Indicated  Utilities Interventions Intervention Not Indicated      Phone call with wife ( with patients request)  Wife reports things are going well.  Wife with questions about O2 and if patient will permanently be on oxygen.  I suggested to talk to PCP for direction. Wife states they might need a long tubing.  Reviewed with wife how to call [employer] to request additional tubing.  Wife reports that patient does not have a part D plan. Encouraged wife to assist with calling medicare to get patient hooked up with part D plan to assist with medication cost. She voiced understanding.   Offered 30 day TOC program and wife declined.  I provided my contact information if patient or wife need to call me back. Interventions Today    Flowsheet Row Most Recent Value  Chronic Disease  Chronic disease during today's visit Chronic Obstructive Pulmonary Disease (COPD)  General Interventions   General Interventions Discussed/Reviewed General Interventions Discussed  Pharmacy Interventions   Pharmacy Dicussed/Reviewed Medications and their functions       Lonia Chimera, RN, BSN, [employer]- Transition of Care Team.  [facility] [phone]

## 2023-09-17 NOTE — Telephone Encounter (Signed)
LM for wife to have pt eat extra greens/salads for next 3 days while on doxycycline and INR appt made for 10/03/23 at 8:30am.  To call back if she has any questions.

## 2023-09-17 NOTE — Telephone Encounter (Signed)
Copied from CRM [id number]. Topic: Clinical - Prescription Issue >> Sep 17, 2023 11:11 AM [name] wrote: Reason for CRM: Patient was asssigned inhlaer but the prescroption was not covered. Wanted to know if can be changed

## 2023-09-17 NOTE — Telephone Encounter (Signed)
Reviewed INR of 3.1 at hospital yesterday.  Have already contacted wife with instructions by voicemail.

## 2023-09-17 NOTE — Telephone Encounter (Signed)
Spoke with patient's wife.

## 2023-09-17 NOTE — Telephone Encounter (Signed)
Patient's wife is calling to state that pt's INR was taken at hospital and she is requested this be viewed by provider.

## 2023-09-17 NOTE — Telephone Encounter (Signed)
[name] - wife called requesting to find out when Mr. Blunk needs to come back in for his coumadin check since being discharged from the hospital.

## 2023-09-18 ENCOUNTER — Ambulatory Visit (HOSPITAL_COMMUNITY): Payer: Medicare Other

## 2023-09-18 ENCOUNTER — Encounter (HOSPITAL_COMMUNITY): Payer: Self-pay

## 2023-09-22 ENCOUNTER — Encounter: Payer: Self-pay | Admitting: Internal Medicine

## 2023-09-23 ENCOUNTER — Telehealth: Payer: Self-pay | Admitting: Internal Medicine

## 2023-09-23 NOTE — Telephone Encounter (Signed)
Called Linda and LVM that if he already has oxygen that they need to contact the DME company and they will make sure they get more tanks and collect the empty ones. Advised her to call back with any questions

## 2023-09-23 NOTE — Telephone Encounter (Signed)
Copied from CRM [id number]. Topic: Clinical - Medication Question >> Sep 22, 2023  9:54 AM Prudencio Pair wrote: Reason for CRM: Patient's wife, [name], called wanting to know if [facility] can prescribe patient some more oxygen. States pt has two appts for the 27th & is afraid he will not have enough oxygen to get through both appts. Please give [name] a call back to advise. CB #: [phone].

## 2023-09-25 ENCOUNTER — Telehealth: Payer: Self-pay | Admitting: Internal Medicine

## 2023-09-25 NOTE — Telephone Encounter (Signed)
Copied from CRM [id number]. Topic: Medical Record Request - Other >> Sep 25, 2023 10:57 AM Ivette P wrote: Reason for CRM: Patient is calling to request to speak to someone about the [facility] paperwork. Requesting call back at [phone]. [name] (wife)

## 2023-09-25 NOTE — Telephone Encounter (Signed)
Spoke to wife , provided fax number

## 2023-09-26 ENCOUNTER — Ambulatory Visit (HOSPITAL_COMMUNITY): Admission: RE | Admit: 2023-09-26 | Payer: Medicare Other | Source: Ambulatory Visit

## 2023-09-26 ENCOUNTER — Ambulatory Visit: Payer: Medicare Other | Admitting: Internal Medicine

## 2023-09-26 ENCOUNTER — Encounter: Payer: Self-pay | Admitting: Internal Medicine

## 2023-09-26 VITALS — BP 109/77 | HR 78 | Ht 70.0 in | Wt 221.0 lb

## 2023-09-26 DIAGNOSIS — J449 Chronic obstructive pulmonary disease, unspecified: Secondary | ICD-10-CM

## 2023-09-26 DIAGNOSIS — J9611 Chronic respiratory failure with hypoxia: Secondary | ICD-10-CM

## 2023-09-26 DIAGNOSIS — C3491 Malignant neoplasm of unspecified part of right bronchus or lung: Secondary | ICD-10-CM | POA: Diagnosis not present

## 2023-09-26 DIAGNOSIS — J441 Chronic obstructive pulmonary disease with (acute) exacerbation: Secondary | ICD-10-CM

## 2023-09-26 DIAGNOSIS — Z09 Encounter for follow-up examination after completed treatment for conditions other than malignant neoplasm: Secondary | ICD-10-CM | POA: Diagnosis not present

## 2023-09-26 MED ORDER — IPRATROPIUM-ALBUTEROL 0.5-2.5 (3) MG/3ML IN SOLN: RESPIRATORY_TRACT | 3 refills | Status: DC

## 2023-09-26 NOTE — Assessment & Plan Note (Signed)
Recent hospitalization for COPD exacerbation, has completed oral doxycycline and prednisone now Symptoms improved now Needs maintenance inhaler-has financial difficulty to get inhaler due to lack of Part D Currently has Advair, but is expensive He would benefit from [place] to reduce COPD exacerbations, patient assistance form provided

## 2023-09-26 NOTE — Assessment & Plan Note (Addendum)
-  Status post left VATS, left upper lobectomy with mediastinal lymph node dissection under the care of Dr. Dorris Fetch on 08/01/2016. - Status post right VATS with wedge resection of right upper lobe nodule and bleb resection under the care of Dr. Dorris Fetch on December 10, 2018.   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 Followed by Heme./Onc.

## 2023-09-26 NOTE — Patient Instructions (Signed)
Please continue using Duonebs as prescribed.  Please continue using Advair as prescribed.  Continue using oxygen at 2 liters per minute for shortness of breath.

## 2023-09-26 NOTE — Progress Notes (Signed)
Established Patient Office Visit  Subjective:  Patient ID: Randall Sullivan, male    DOB: 1951-04-02  Age: 72 y.o. MRN: [id number]  CC:  Chief Complaint  Patient presents with   Hospitalization Follow-up    Hospital Follow Up    HPI Randall Sullivan is a 72 y.o. male with past medical history of HTN, PAF, CVA, lung cancer s/p lobectomy, HLD and insomnia who presents for follow-up after recent hospitalization.  He presented to the ED with worsening shortness of breath as well as increasing cough with sputum production over the last 2 days prior to the admission on 09/14/23. He had home oxygen to use as needed, but had been needing it more frequently over the last 2 days. He denied any chest pain, fevers, or chills and had been taking his medications routinely. He denied any sick contacts. He denied any current tobacco abuse or alcohol use.  He was given IV steroids a antibiotics.  He was discharged with oral doxycycline and prednisone on 09/16/23.  He has been feeling better at home.  He has chronic hypoxia, and has been using 2 LPM of O2 at home.  Denies any fever, chills or recent worsening of wheezing.  He was given Symbicort from the hospital, but was expensive.  He has been using Advair regularly, but has cost concern as he does not have Medicare part D.  He uses DuoNeb 3-4 times daily. His last PET scan was concerning for recurrence of lung adenocarcinoma.  He is followed by oncology, and is planned to get lung biopsy for it.  Past Medical History:  Diagnosis Date   A-fib [facility]) 2017   after last lung surgery patient had a history if Afib documented in hospital    Acute appendicitis 07/14/2021   Arthritis    back & legs ( knees)   Cancer (HCC)    lung   Complication of anesthesia    agitated upon waking from anesth.    Emphysema    Emphysema of lung (HCC)    Hyperlipidemia    Hypertension    OSA (obstructive sleep apnea) 11/25/2022   Oxygen dependent    2L continuous   Stroke [facility])     Tobacco abuse     Past Surgical History:  Procedure Laterality Date   APPENDECTOMY  07/15/2021   HERNIA REPAIR Bilateral 1999   umbilical   LAPAROSCOPIC APPENDECTOMY N/A 07/15/2021   Procedure: APPENDECTOMY LAPAROSCOPIC;  Surgeon: Lucretia Roers, MD;  Location: [facility];  Service: General;  Laterality: N/A;   VIDEO ASSISTED THORACOSCOPY Right 12/21/2018   Procedure: VIDEO ASSISTED THORACOSCOPY AND RESECTION OF BLEBS RIGHT LOWER LOBE;  Surgeon: Loreli Slot, MD;  Location: [facility] OR;  Service: Thoracic;  Laterality: Right;   VIDEO ASSISTED THORACOSCOPY (VATS)/ LOBECTOMY Left 08/01/2016   Procedure: VIDEO ASSISTED THORACOSCOPY (VATS)/ LEFT UPPER LOBECTOMY with lymph node sampling and onQ placement;  Surgeon: Loreli Slot, MD;  Location: [facility] OR;  Service: Thoracic;  Laterality: Left;   VIDEO ASSISTED THORACOSCOPY (VATS)/WEDGE RESECTION Right 12/09/2018   Procedure: VIDEO ASSISTED THORACOSCOPY (VATS)/WEDGE RESECTION;  Surgeon: Loreli Slot, MD;  Location: [facility];  Service: Thoracic;  Laterality: Right;   VIDEO BRONCHOSCOPY Bilateral 12/21/2018   Procedure: VIDEO BRONCHOSCOPY;  Surgeon: Loreli Slot, MD;  Location: [facility];  Service: Thoracic;  Laterality: Bilateral;   VIDEO BRONCHOSCOPY WITH ENDOBRONCHIAL NAVIGATION N/A 07/03/2016   Procedure: VIDEO BRONCHOSCOPY WITH ENDOBRONCHIAL NAVIGATION;  Surgeon: Loreli Slot, MD;  Location: [facility];  Service: Thoracic;  Laterality: N/A;   VIDEO BRONCHOSCOPY WITH INSERTION OF INTERBRONCHIAL VALVE (IBV) N/A 12/11/2018   Procedure: VIDEO BRONCHOSCOPY WITH INSERTION OF INTERBRONCHIAL VALVE (IBV);  Surgeon: Loreli Slot, MD;  Location: [facility] OR;  Service: Thoracic;  Laterality: N/A;   VIDEO BRONCHOSCOPY WITH INSERTION OF INTERBRONCHIAL VALVE (IBV) N/A 02/10/2019   Procedure: VIDEO BRONCHOSCOPY WITH REMOVAL OF INTERBRONCHIAL VALVE (IBV);  Surgeon: Loreli Slot, MD;  Location: [facility] OR;  Service: Thoracic;   Laterality: N/A;    Family History  Problem Relation Age of Onset   Cancer Mother    Cancer Sister    Aneurysm Sister    Kidney disease Paternal Aunt    Asthma Brother    Cancer - Lung Brother    Cancer Brother    Arthritis Brother     Social History   Socioeconomic History   Marital status: Married    Spouse name: Not on file   Number of children: 2   Years of education: Not on file   Highest education level: 9th grade  Occupational History   Occupation: Vinyl siding   Occupation: Retired  Tobacco Use   Smoking status: Former    Current packs/day: 0.00    Average packs/day: 1 pack/day for 40.0 years (40.0 ttl pk-yrs)    Types: Cigarettes    Start date: 05/28/1976    Quit date: 05/28/2016    Years since quitting: 7.3   Smokeless tobacco: Never  Vaping Use   Vaping status: Never Used  Substance and Sexual Activity   Alcohol use: Not Currently    Comment: quit- 2016   Drug use: No   Sexual activity: Not Currently  Other Topics Concern   Not on file  Social History Narrative   Married for 20 years,second.Lives with wife. 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   Social Drivers of Health   Financial Resource Strain: Patient Declined (07/11/2023)   Overall Financial Resource Strain (CARDIA)    Difficulty of Paying Living Expenses: Patient declined  Food Insecurity: No Food Insecurity (09/17/2023)   Hunger Vital Sign    Worried About Running Out of Food in the Last Year: Never true    Ran Out of Food in the Last Year: Never true  Transportation Needs: No Transportation Needs (09/17/2023)   PRAPARE - Administrator, Civil Service (Medical): No    Lack of Transportation (Non-Medical): No  Physical Activity: Insufficiently Active (07/11/2023)   Exercise Vital Sign    Days of Exercise per Week: 3 days    Minutes of Exercise per Session: 10 min  Stress: Patient Declined (07/11/2023)   [employer] of Occupational Health - Occupational Stress  Questionnaire    Feeling of Stress : Patient declined  Social Connections: Unknown (07/11/2023)   Social Connection and Isolation Panel [NHANES]    Frequency of Communication with Friends and Family: Twice a week    Frequency of Social Gatherings with Friends and Family: Once a week    Attends Religious Services: Patient declined    Database administrator or Organizations: No    Attends Banker Meetings: Never    Marital Status: Married  Catering manager Violence: Patient Unable To Answer (09/17/2023)   Humiliation, Afraid, Rape, and Kick questionnaire    Fear of Current or Ex-Partner: Patient unable to answer    Emotionally Abused: Patient unable to answer    Physically Abused: Patient unable to answer    Sexually Abused: Patient unable to answer    Outpatient  Medications Prior to Visit  Medication Sig Dispense Refill   acetaminophen (TYLENOL) 325 MG tablet Take 2 tablets (650 mg total) by mouth every 6 (six) hours as needed for mild pain (or Fever >/= 101).     cetirizine (ZYRTEC) 10 MG tablet Take 10 mg by mouth daily.     Cholecalciferol (VITAMIN D3) 125 MCG (5000 UT) CAPS Take 1 capsule (5,000 Units total) by mouth daily. 30 capsule 0   dextromethorphan-guaiFENesin (MUCINEX DM) 30-600 MG 12hr tablet Take 1 tablet by mouth 2 (two) times daily for 10 days. 20 tablet 0   diltiazem (CARDIZEM CD) 180 MG 24 hr capsule Take 1 capsule (180 mg total) by mouth daily. 90 capsule 3   fenofibrate (TRICOR) 145 MG tablet Take 1 tablet (145 mg total) by mouth daily. 90 tablet 1   finasteride (PROSCAR) 5 MG tablet Take 1 tablet (5 mg total) by mouth daily. 30 tablet 3   fluticasone-salmeterol (ADVAIR) 100-50 MCG/ACT AEPB Inhale 1 puff into the lungs 2 (two) times daily. 1 each 3   meclizine (ANTIVERT) 25 MG tablet Take 1 tablet (25 mg total) by mouth 3 (three) times daily as needed for dizziness. (Patient not taking: Reported on 09/17/2023) 30 tablet 0   metoprolol succinate (TOPROL XL)  50 MG 24 hr tablet Take 1 tablet (50 mg total) by mouth daily. Take with or immediately following a meal. 30 tablet 2   Omega-3 Fatty Acids (FISH OIL ULTRA) 1400 MG CAPS Take 1 capsule by mouth in the morning and at bedtime.     OXYGEN Inhale 2 L into the lungs continuous. Hx of lung ca, copd, and emphysema     pantoprazole (PROTONIX) 40 MG tablet Take 1 tablet (40 mg total) by mouth daily. 30 tablet 1   polyethylene glycol (MIRALAX / GLYCOLAX) 17 g packet Take 17 g by mouth daily as needed for mild constipation.     rosuvastatin (CRESTOR) 40 MG tablet Take 1 tablet (40 mg total) by mouth daily. (Patient not taking: Reported on 09/14/2023) 90 tablet 3   tamsulosin (FLOMAX) 0.4 MG CAPS capsule Take 2 capsules (0.8 mg total) by mouth daily. 180 capsule 3   traZODone (DESYREL) 100 MG tablet Take 1 tablet (100 mg total) by mouth at bedtime. (Patient not taking: Reported on 09/17/2023) 90 tablet 1   warfarin (COUMADIN) 5 MG tablet Take 1 tablet (5 mg total) by mouth daily. (Patient taking differently: Take 5 mg by mouth daily. 0.5 tab on Tuesday and Friday) 90 tablet 1   budesonide-formoterol (SYMBICORT) 160-4.5 MCG/ACT inhaler Inhale 2 puffs into the lungs in the morning and at bedtime. (Patient not taking: Reported on 09/17/2023) 1 each 12   doxycycline (VIBRA-TABS) 100 MG tablet Take 1 tablet (100 mg total) by mouth every 12 (twelve) hours for 3 days. 6 tablet 0   ipratropium-albuterol (DUONEB) 0.5-2.5 (3) MG/3ML SOLN USE 1 AMPULE IN NEBULIZER 4 TIMES DAILY AND AS NEEDED FOR SHORTNESS OF BREATH AND FOR WHEEZING 450 mL 0   predniSONE (DELTASONE) 20 MG tablet 1 day; then 2 tablets by mouth daily x 2 days; then 1 tablet by mouth daily x 3 days; then half tablet by mouth daily x 3 days and stop prednisone. 12 tablet 0   No facility-administered medications prior to visit.    No Known Allergies  ROS Review of Systems  Constitutional:  Positive for fatigue. Negative for chills and fever.  HENT:   Negative for congestion, sinus pressure and sinus pain.  Eyes:  Positive for visual disturbance. Negative for redness.  Respiratory:  Positive for cough and shortness of breath.   Cardiovascular:  Negative for chest pain and palpitations.  Gastrointestinal:  Negative for diarrhea, nausea and vomiting.  Genitourinary:  Positive for frequency. Negative for dysuria and hematuria.  Musculoskeletal:  Negative for neck pain and neck stiffness.  Skin:  Negative for rash.  Neurological:  Positive for dizziness. Negative for tremors, seizures, syncope and headaches.  Psychiatric/Behavioral:  Positive for sleep disturbance. Negative for agitation and behavioral problems.       Objective:    Physical Exam Vitals reviewed.  Constitutional:      General: He is not in acute distress.    Appearance: He is not diaphoretic.     Comments: In wheelchair  HENT:     Head: Normocephalic and atraumatic.     Nose: Nose normal.     Mouth/Throat:     Mouth: Mucous membranes are moist.  Eyes:     General: No scleral icterus.    Extraocular Movements: Extraocular movements intact.  Cardiovascular:     Rate and Rhythm: Normal rate and regular rhythm.     Pulses: Normal pulses.     Heart sounds: Normal heart sounds. No murmur heard. Pulmonary:     Breath sounds: No wheezing or rales.  Musculoskeletal:     Cervical back: Neck supple. No tenderness.     Right lower leg: No edema.     Left lower leg: No edema.  Skin:    General: Skin is warm.     Findings: No rash.  Neurological:     General: No focal deficit present.     Mental Status: He is alert and oriented to person, place, and time. Mental status is at baseline.     Sensory: Sensory deficit (B/l feet) present.  Psychiatric:        Mood and Affect: Mood normal.        Behavior: Behavior normal.     BP 109/77 (BP Location: Right Arm, Patient Position: Sitting, Cuff Size: Normal)   Pulse 78   Ht 5\' 10"  (1.778 m)   Wt 221 lb (100.2 kg)    SpO2 97%   BMI 31.71 kg/m  Wt Readings from Last 3 Encounters:  09/26/23 221 lb (100.2 kg)  09/14/23 221 lb (100.2 kg)  09/03/23 221 lb (100.2 kg)    Lab Results  Component Value Date   TSH 1.590 07/05/2021   Lab Results  Component Value Date   WBC 12.8 (H) 09/15/2023   HGB 13.6 09/15/2023   HCT 43.9 09/15/2023   MCV 87.1 09/15/2023   PLT 205 09/15/2023   Lab Results  Component Value Date   NA 136 09/15/2023   K 4.5 09/15/2023   CHLORIDE 99 04/09/2017   CO2 23 09/15/2023   GLUCOSE 242 (H) 09/15/2023   BUN 28 (H) 09/15/2023   CREATININE 1.28 (H) 09/15/2023   BILITOT 0.4 03/04/2023   ALKPHOS 45 03/04/2023   AST 22 03/04/2023   ALT 26 03/04/2023   PROT 6.9 03/04/2023   ALBUMIN 4.6 03/04/2023   CALCIUM 9.3 09/15/2023   ANIONGAP 13 09/15/2023   EGFR 63 07/15/2023   Lab Results  Component Value Date   CHOL 188 07/15/2023   Lab Results  Component Value Date   HDL 33 (L) 07/15/2023   Lab Results  Component Value Date   LDLCALC 101 (H) 07/15/2023   Lab Results  Component Value Date   TRIG 317 (  H) 07/15/2023   Lab Results  Component Value Date   CHOLHDL 5.7 (H) 07/15/2023   Lab Results  Component Value Date   HGBA1C 6.1 (H) 07/15/2023      Assessment & Plan:   Problem List Items Addressed This Visit       Respiratory   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 Dorris Fetch on 08/01/2016. - Status post right VATS with wedge resection of right upper lobe nodule and bleb resection under the care of Dr. Dorris Fetch on December 10, 2018.   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 Followed by Heme./Onc.      Chronic respiratory failure with hypoxia (HCC)   Currently at 2 LPM home O2 Due to COPD and history of lung cancer Has oxygen supplies and portable  O2, prescription has been sent to [facility]      COPD exacerbation (HCC) - Primary   Recent hospitalization for COPD exacerbation, has completed oral doxycycline and prednisone now Symptoms improved now Needs maintenance inhaler-has financial difficulty to get inhaler due to lack of Part D Currently has Advair, but is expensive He would benefit from [place] to reduce COPD exacerbations, patient assistance form provided      Relevant Medications   ipratropium-albuterol (DUONEB) 0.5-2.5 (3) MG/3ML SOLN     Other   Hospital discharge follow-up   Hospital chart reviewed, including discharge summary Medications reconciled and reviewed with the patient in detail       Meds ordered this encounter  Medications   ipratropium-albuterol (DUONEB) 0.5-2.5 (3) MG/3ML SOLN    Sig: USE 1 AMPULE IN NEBULIZER 4 TIMES DAILY AND AS NEEDED FOR SHORTNESS OF BREATH AND FOR WHEEZING    Dispense:  450 mL    Refill:  3    Follow-up: Return if symptoms worsen or fail to improve.    Anabel Halon, MD

## 2023-09-26 NOTE — Assessment & Plan Note (Signed)
Hospital chart reviewed, including discharge summary Medications reconciled and reviewed with the patient in detail 

## 2023-09-26 NOTE — Assessment & Plan Note (Signed)
Currently at 2 LPM home O2 Due to COPD and history of lung cancer Has oxygen supplies and portable O2, prescription has been sent to [facility]

## 2023-09-29 ENCOUNTER — Encounter: Payer: Self-pay | Admitting: Internal Medicine

## 2023-09-29 ENCOUNTER — Other Ambulatory Visit: Payer: Self-pay | Admitting: Internal Medicine

## 2023-09-29 DIAGNOSIS — J449 Chronic obstructive pulmonary disease, unspecified: Secondary | ICD-10-CM

## 2023-09-29 NOTE — Telephone Encounter (Signed)
Refills sent

## 2023-10-02 ENCOUNTER — Telehealth: Payer: Self-pay | Admitting: Internal Medicine

## 2023-10-02 NOTE — Telephone Encounter (Signed)
 Forms in provider folder to be completed

## 2023-10-02 NOTE — Telephone Encounter (Signed)
 Copied from CRM [id number]. Topic: General - Other >> Oct 02, 2023 10:27 AM Graeme ORN wrote: Reason for CRM: Patient wife called. Checking on status of FMLA form she needs completed because she had to miss work while her husband was in the hospital. States the company has sent to form multiple times but have not received a response. 10/06/2023 is the cut off and the form has to be completed by then.. Thank You

## 2023-10-03 ENCOUNTER — Encounter: Payer: Self-pay | Admitting: Internal Medicine

## 2023-10-03 ENCOUNTER — Telehealth: Payer: Self-pay | Admitting: Internal Medicine

## 2023-10-03 NOTE — Telephone Encounter (Unsigned)
 Copied from CRM [id number]. Topic: Medical Record Request - Other >> Oct 03, 2023 10:21 AM Bobbye Morton wrote: Reason for CRM: Patient is calling about FMLA paperwork and is requesting a call back. Ridge Lafond [phone]

## 2023-10-06 ENCOUNTER — Inpatient Hospital Stay: Payer: Medicare Other

## 2023-10-06 ENCOUNTER — Inpatient Hospital Stay: Payer: Medicare Other | Admitting: Internal Medicine

## 2023-10-07 ENCOUNTER — Ambulatory Visit: Payer: Medicare Other

## 2023-10-08 ENCOUNTER — Telehealth: Payer: Self-pay | Admitting: Internal Medicine

## 2023-10-08 NOTE — Telephone Encounter (Signed)
 [place] [employer]   Call patient spouse [name] [phone]) when forms are ready.    Copied Noted Scanned (to s drive for provider to completed)

## 2023-10-08 NOTE — Telephone Encounter (Signed)
 faxed

## 2023-10-14 ENCOUNTER — Institutional Professional Consult (permissible substitution) (INDEPENDENT_AMBULATORY_CARE_PROVIDER_SITE_OTHER): Payer: Medicare Other | Admitting: Thoracic Surgery (Cardiothoracic Vascular Surgery)

## 2023-10-14 ENCOUNTER — Encounter: Payer: Self-pay | Admitting: Thoracic Surgery (Cardiothoracic Vascular Surgery)

## 2023-10-14 ENCOUNTER — Other Ambulatory Visit: Payer: Self-pay | Admitting: Thoracic Surgery (Cardiothoracic Vascular Surgery)

## 2023-10-14 VITALS — BP 118/75 | HR 69 | Resp 18 | Ht 70.0 in | Wt 221.0 lb

## 2023-10-14 DIAGNOSIS — C3491 Malignant neoplasm of unspecified part of right bronchus or lung: Secondary | ICD-10-CM

## 2023-10-14 NOTE — Progress Notes (Signed)
 PCP is Tobie Suzzane POUR, MD Referring Provider is Sherrod Sherrod, MD  Chief Complaint  Patient presents with   Lung Cancer    Review workup    HPI: Randall Sullivan is sent for consultation regarding a right upper lobe nodule.  Randall Sullivan is a 73 year old man with a past medical history significant for tobacco abuse, lung cancer x 2, COPD, atrial fibrillation, hypertension, hyperlipidemia, stroke, obstructive sleep apnea, and oxygen  dependent chronic hypoxic respiratory failure.  He had a 40-pack-year history of smoking prior to quitting in 2017.  He had a left upper lobectomy for stage Ia non-small cell carcinoma in 2017.  In 2020 he had right VATS for wedge resection of right upper lobe adenocarcinoma and bleb resection.  Margin was clear.  Complicated by air leaks requiring IBV and ultimately redo VATS.  The IBV were removed in May 2020.  He has been under observation since then.  In October he had a follow-up with Dr. Sherrod.  His CT showed an increase in soft tissue density around his right upper lobe staple line.  He did not had a PET/CT.  That showed hypermetabolic activity in the area although there was misregistration.  He was scheduled to see me in December but that appointment was canceled because he was in the hospital with a COPD exacerbation.  Breathing is currently stable.  He is on home oxygen  2 L/min.  No change in appetite or weight loss.  Past Medical History:  Diagnosis Date   A-fib [facility]) 2017   after last lung surgery patient had a history if Afib documented in hospital    Acute appendicitis 07/14/2021   Arthritis    back & legs ( knees)   Cancer (HCC)    lung   Complication of anesthesia    agitated upon waking from anesth.    Emphysema    Emphysema of lung (HCC)    Hyperlipidemia    Hypertension    OSA (obstructive sleep apnea) 11/25/2022   Oxygen  dependent    2L continuous   Stroke [facility])    Tobacco abuse     Past Surgical History:  Procedure Laterality  Date   APPENDECTOMY  07/15/2021   HERNIA REPAIR Bilateral 1999   umbilical   LAPAROSCOPIC APPENDECTOMY N/A 07/15/2021   Procedure: APPENDECTOMY LAPAROSCOPIC;  Surgeon: Kallie Manuelita BROCKS, MD;  Location: [facility];  Service: General;  Laterality: N/A;   VIDEO ASSISTED THORACOSCOPY Right 12/21/2018   Procedure: VIDEO ASSISTED THORACOSCOPY AND RESECTION OF BLEBS RIGHT LOWER LOBE;  Surgeon: Kerrin Elspeth BROCKS, MD;  Location: [facility] OR;  Service: Thoracic;  Laterality: Right;   VIDEO ASSISTED THORACOSCOPY (VATS)/ LOBECTOMY Left 08/01/2016   Procedure: VIDEO ASSISTED THORACOSCOPY (VATS)/ LEFT UPPER LOBECTOMY with lymph node sampling and onQ placement;  Surgeon: Elspeth BROCKS Kerrin, MD;  Location: [facility] OR;  Service: Thoracic;  Laterality: Left;   VIDEO ASSISTED THORACOSCOPY (VATS)/WEDGE RESECTION Right 12/09/2018   Procedure: VIDEO ASSISTED THORACOSCOPY (VATS)/WEDGE RESECTION;  Surgeon: Kerrin Elspeth BROCKS, MD;  Location: [facility] OR;  Service: Thoracic;  Laterality: Right;   VIDEO BRONCHOSCOPY Bilateral 12/21/2018   Procedure: VIDEO BRONCHOSCOPY;  Surgeon: Kerrin Elspeth BROCKS, MD;  Location: [facility] OR;  Service: Thoracic;  Laterality: Bilateral;   VIDEO BRONCHOSCOPY WITH ENDOBRONCHIAL NAVIGATION N/A 07/03/2016   Procedure: VIDEO BRONCHOSCOPY WITH ENDOBRONCHIAL NAVIGATION;  Surgeon: Elspeth BROCKS Kerrin, MD;  Location: [facility] OR;  Service: Thoracic;  Laterality: N/A;   VIDEO BRONCHOSCOPY WITH INSERTION OF INTERBRONCHIAL VALVE (IBV) N/A 12/11/2018   Procedure: VIDEO BRONCHOSCOPY WITH  INSERTION OF INTERBRONCHIAL VALVE (IBV);  Surgeon: Kerrin Elspeth BROCKS, MD;  Location: [facility] OR;  Service: Thoracic;  Laterality: N/A;   VIDEO BRONCHOSCOPY WITH INSERTION OF INTERBRONCHIAL VALVE (IBV) N/A 02/10/2019   Procedure: VIDEO BRONCHOSCOPY WITH REMOVAL OF INTERBRONCHIAL VALVE (IBV);  Surgeon: Kerrin Elspeth BROCKS, MD;  Location: [facility] OR;  Service: Thoracic;  Laterality: N/A;    Family History  Problem Relation Age of Onset    Cancer Mother    Cancer Sister    Aneurysm Sister    Kidney disease Paternal Aunt    Asthma Brother    Cancer - Lung Brother    Cancer Brother    Arthritis Brother     Social History Social History   Tobacco Use   Smoking status: Former    Current packs/day: 0.00    Average packs/day: 1 pack/day for 40.0 years (40.0 ttl pk-yrs)    Types: Cigarettes    Start date: 05/28/1976    Quit date: 05/28/2016    Years since quitting: 7.3   Smokeless tobacco: Never  Vaping Use   Vaping status: Never Used  Substance Use Topics   Alcohol use: Not Currently    Comment: quit- 2016   Drug use: No    Current Outpatient Medications  Medication Sig Dispense Refill   acetaminophen  (TYLENOL ) 325 MG tablet Take 2 tablets (650 mg total) by mouth every 6 (six) hours as needed for mild pain (or Fever >/= 101).     cetirizine  (ZYRTEC ) 10 MG tablet Take 10 mg by mouth daily.     Cholecalciferol  (VITAMIN D3) 125 MCG (5000 UT) CAPS Take 1 capsule (5,000 Units total) by mouth daily. 30 capsule 0   diltiazem  (CARDIZEM  CD) 180 MG 24 hr capsule Take 1 capsule (180 mg total) by mouth daily. 90 capsule 3   fenofibrate  (TRICOR ) 145 MG tablet Take 1 tablet (145 mg total) by mouth daily. 90 tablet 1   finasteride  (PROSCAR ) 5 MG tablet Take 1 tablet (5 mg total) by mouth daily. 30 tablet 3   fluticasone -salmeterol (ADVAIR) 100-50 MCG/ACT AEPB Inhale 1 puff into the lungs 2 (two) times daily. 1 each 3   ipratropium-albuterol  (DUONEB) 0.5-2.5 (3) MG/3ML SOLN USE 1 AMPULE IN NEBULIZER 4 TIMES DAILY AND AS NEEDED FOR SHORTNESS OF BREATH AND FOR WHEEZING 450 mL 0   meclizine  (ANTIVERT ) 25 MG tablet Take 1 tablet (25 mg total) by mouth 3 (three) times daily as needed for dizziness. 30 tablet 0   metoprolol  succinate (TOPROL  XL) 50 MG 24 hr tablet Take 1 tablet (50 mg total) by mouth daily. Take with or immediately following a meal. 30 tablet 2   Omega-3 Fatty Acids (FISH OIL ULTRA) 1400 MG CAPS Take 1 capsule by mouth in  the morning and at bedtime.     OXYGEN  Inhale 2 L into the lungs continuous. Hx of lung ca, copd, and emphysema     pantoprazole  (PROTONIX ) 40 MG tablet Take 1 tablet (40 mg total) by mouth daily. 30 tablet 1   polyethylene glycol (MIRALAX  / GLYCOLAX ) 17 g packet Take 17 g by mouth daily as needed for mild constipation.     rosuvastatin  (CRESTOR ) 40 MG tablet Take 1 tablet (40 mg total) by mouth daily. 90 tablet 3   tamsulosin  (FLOMAX ) 0.4 MG CAPS capsule Take 2 capsules (0.8 mg total) by mouth daily. 180 capsule 3   traZODone  (DESYREL ) 100 MG tablet Take 1 tablet (100 mg total) by mouth at bedtime. 90 tablet 1  warfarin (COUMADIN ) 5 MG tablet Take 1 tablet (5 mg total) by mouth daily. (Patient taking differently: Take 5 mg by mouth daily. 0.5 tab on Tuesday and Friday) 90 tablet 1   No current facility-administered medications for this visit.    No Known Allergies  Review of Systems  Constitutional:  Positive for activity change. Negative for appetite change and unexpected weight change.  HENT:  Negative for voice change.   Respiratory:  Positive for cough, shortness of breath and wheezing.   Cardiovascular:  Negative for chest pain.    BP 118/75   Pulse 69   Resp 18   Ht 5' 10 (1.778 m)   Wt 221 lb (100.2 kg)   SpO2 97% Comment: 2L O2  BMI 31.71 kg/m  Physical Exam Vitals reviewed.  Constitutional:      General: He is not in acute distress.    Appearance: He is ill-appearing.  HENT:     Head: Normocephalic and atraumatic.  Eyes:     General: No scleral icterus.    Extraocular Movements: Extraocular movements intact.  Cardiovascular:     Rate and Rhythm: Normal rate and regular rhythm.     Heart sounds: Normal heart sounds. No murmur heard. Pulmonary:     Comments: Diminished breath sounds left base, faint wheezing bilaterally Abdominal:     General: There is no distension.     Palpations: Abdomen is soft.  Lymphadenopathy:     Cervical: No cervical adenopathy.   Skin:    General: Skin is warm and dry.  Neurological:     General: No focal deficit present.     Mental Status: He is alert and oriented to person, place, and time.     Cranial Nerves: No cranial nerve deficit.    Diagnostic Tests: NUCLEAR MEDICINE PET SKULL BASE TO THIGH   TECHNIQUE: 11.1 mCi F-18 FDG was injected intravenously. Full-ring PET imaging was performed from the skull base to thigh after the radiotracer. CT data was obtained and used for attenuation correction and anatomic localization.   Fasting blood glucose:  mg/dl   COMPARISON:  CT chest 07/08/2023 and CT abdomen pelvis 07/14/2021. PET 10/15/2018.   FINDINGS: Mediastinal blood pool activity: SUV max 2.4   Liver activity: SUV max NA   NECK:   Misregistration artifact.  No definite abnormal hypermetabolism.   Incidental CT findings:   Left occipital lobe encephalomalacia.   CHEST:   Again, misregistration artifact degrades image quality hypermetabolic soft tissue nodule in the central right upper lobe, associated with a suture line, measures 2.2 x 2.6 cm (607/27), SUV max 9.4. Faint 6 mm nodule in the superior segment right lower lobe (607/32), too small for PET resolution. No additional abnormal hypermetabolism.   Incidental CT findings:   Atherosclerotic calcification of the aorta, aortic valve and coronary arteries. Heart is at the upper limits of normal in size to mildly enlarged. No pericardial or pleural effusion. Left upper lobectomy. Right upper lobe wedge resection. Centrilobular and paraseptal emphysema.   ABDOMEN/PELVIS:   Again, wrist registration artifact degrades image quality. 1.5 cm fat density left adrenal myelolipoma. No follow-up necessary. Focal hypermetabolism in the central prostate, SUV max 6.5. No additional abnormal hypermetabolism.   Incidental CT findings:   Liver is decreased in attenuation diffusely. Tiny left renal stone. Slightly elevated left hemidiaphragm.  Small bilateral inguinal hernias contain fat.   SKELETON:   No abnormal hypermetabolism.   Incidental CT findings:   Degenerative changes in the spine.   IMPRESSION:  1. Misregistration artifact degrades image quality. 2. Recurrent bronchogenic carcinoma in the medial right upper lobe, along the resection margin, as suspected on CT chest 07/08/2023. No evidence of distant metastatic disease. 3. Faint 6 mm nodule in the superior segment right lower lobe, too small for PET resolution. Recommend continued attention on follow-up. 4. Focal hypermetabolism in the central prostate. Please correlate clinically as malignancy cannot be definitively excluded. 5. Hepatic steatosis. 6. Tiny left renal stone. 7. Left adrenal myelolipoma. 8. Aortic atherosclerosis (ICD10-I70.0). Coronary artery calcification. 9.  Emphysema (ICD10-J43.9).     Electronically Signed   By: Newell Eke M.D.   On: 09/06/2023 15:55 I personally reviewed the CT and PET/CT images.  Previous lung resections.  Soft tissue density around staple line medial right upper lobe.  No adenopathy.  No evidence of distant metastatic disease.  Aortic and coronary atherosclerosis.  Emphysema.  Impression: Randall Sullivan is a 73 year old man with a past medical history significant for tobacco abuse, lung cancer x 2, COPD, atrial fibrillation, hypertension, hyperlipidemia, stroke, obstructive sleep apnea, and oxygen  dependent chronic hypoxic respiratory failure.  He had a 40-pack-year history of smoking prior to quitting in 2017.  He had a left upper lobectomy for stage Ia non-small cell carcinoma in 2017.  In 2020 he had right VATS for wedge resection of right upper lobe adenocarcinoma and bleb resection.  Soft tissue density right upper lobe-likely recurrent adenocarcinoma.  Needs a biopsy for diagnostic purposes.  He is not a candidate for surgical resection.  Can potentially be a candidate for radiation.  I discussed the  postoperative procedure of electromagnetic navigational bronchoscopy and endobronchial ultrasound with Mr. Vanterpool and his family.  They understand this is an endoscopic procedure usually done on an outpatient basis but does require general anesthesia.  He has had issues with general anesthesia previously.  I informed him of the indications, risk, benefits, and alternatives.  They understand this is diagnostic and not therapeutic.  They understand the risks include those associated with general anesthesia.  Procedure specific risks include pneumothorax, bleeding, and failure to make a diagnosis.  They understand this nodule is in a difficult location and there is a significant probability we will be able to access it adequately.  He is on Coumadin .  Needs to hold that for 5 days prior to the procedure.  Dr. Denver notes from 09/03/2023 indicated that is reasonable.  Plan: CT chest super D protocol for planning for navigational bronchoscopy.  ENB and EBUS on Friday, 10/24/2023  Hold Coumadin  for 5 days prior to procedure  Elspeth JAYSON Millers, MD [facility] [phone]

## 2023-10-14 NOTE — H&P (View-Only) (Signed)
 PCP is Anabel Halon, MD Referring Provider is [name], MD  Chief Complaint  Patient presents with   Lung Cancer    Review workup    HPI: Mr. Semper is sent for consultation regarding a right upper lobe nodule.  Landan Fedie is a 73 year old man with a past medical history significant for tobacco abuse, lung cancer x 2, COPD, atrial fibrillation, hypertension, hyperlipidemia, stroke, obstructive sleep apnea, and oxygen dependent chronic hypoxic respiratory failure.  He had a 40-pack-year history of smoking prior to quitting in 2017.  He had a left upper lobectomy for stage Ia non-small cell carcinoma in 2017.  In 2020 he had right VATS for wedge resection of right upper lobe adenocarcinoma and bleb resection.  Margin was clear.  Complicated by air leaks requiring IBV and ultimately redo VATS.  The IBV were removed in May 2020.  He has been under observation since then.  In October he had a follow-up with Dr. Arbutus Ped.  His CT showed an increase in soft tissue density around his right upper lobe staple line.  He did not had a PET/CT.  That showed hypermetabolic activity in the area although there was misregistration.  He was scheduled to see me in December but that appointment was canceled because he was in the hospital with a COPD exacerbation.  Breathing is currently stable.  He is on home oxygen 2 L/min.  No change in appetite or weight loss.  Past Medical History:  Diagnosis Date   A-fib [facility]) 2017   after last lung surgery patient had a history if Afib documented in hospital    Acute appendicitis 07/14/2021   Arthritis    back & legs ( knees)   Cancer (HCC)    lung   Complication of anesthesia    agitated upon waking from anesth.    Emphysema    Emphysema of lung (HCC)    Hyperlipidemia    Hypertension    OSA (obstructive sleep apnea) 11/25/2022   Oxygen dependent    2L continuous   Stroke [facility])    Tobacco abuse     Past Surgical History:  Procedure Laterality  Date   APPENDECTOMY  07/15/2021   HERNIA REPAIR Bilateral 1999   umbilical   LAPAROSCOPIC APPENDECTOMY N/A 07/15/2021   Procedure: APPENDECTOMY LAPAROSCOPIC;  Surgeon: Lucretia Roers, MD;  Location: [facility];  Service: General;  Laterality: N/A;   VIDEO ASSISTED THORACOSCOPY Right 12/21/2018   Procedure: VIDEO ASSISTED THORACOSCOPY AND RESECTION OF BLEBS RIGHT LOWER LOBE;  Surgeon: Loreli Slot, MD;  Location: [facility] OR;  Service: Thoracic;  Laterality: Right;   VIDEO ASSISTED THORACOSCOPY (VATS)/ LOBECTOMY Left 08/01/2016   Procedure: VIDEO ASSISTED THORACOSCOPY (VATS)/ LEFT UPPER LOBECTOMY with lymph node sampling and onQ placement;  Surgeon: Loreli Slot, MD;  Location: [facility] OR;  Service: Thoracic;  Laterality: Left;   VIDEO ASSISTED THORACOSCOPY (VATS)/WEDGE RESECTION Right 12/09/2018   Procedure: VIDEO ASSISTED THORACOSCOPY (VATS)/WEDGE RESECTION;  Surgeon: Loreli Slot, MD;  Location: [facility] OR;  Service: Thoracic;  Laterality: Right;   VIDEO BRONCHOSCOPY Bilateral 12/21/2018   Procedure: VIDEO BRONCHOSCOPY;  Surgeon: Loreli Slot, MD;  Location: [facility] OR;  Service: Thoracic;  Laterality: Bilateral;   VIDEO BRONCHOSCOPY WITH ENDOBRONCHIAL NAVIGATION N/A 07/03/2016   Procedure: VIDEO BRONCHOSCOPY WITH ENDOBRONCHIAL NAVIGATION;  Surgeon: Loreli Slot, MD;  Location: [facility] OR;  Service: Thoracic;  Laterality: N/A;   VIDEO BRONCHOSCOPY WITH INSERTION OF INTERBRONCHIAL VALVE (IBV) N/A 12/11/2018   Procedure: VIDEO BRONCHOSCOPY WITH  INSERTION OF INTERBRONCHIAL VALVE (IBV);  Surgeon: Kerrin Elspeth BROCKS, MD;  Location: [facility] OR;  Service: Thoracic;  Laterality: N/A;   VIDEO BRONCHOSCOPY WITH INSERTION OF INTERBRONCHIAL VALVE (IBV) N/A 02/10/2019   Procedure: VIDEO BRONCHOSCOPY WITH REMOVAL OF INTERBRONCHIAL VALVE (IBV);  Surgeon: Kerrin Elspeth BROCKS, MD;  Location: [facility] OR;  Service: Thoracic;  Laterality: N/A;    Family History  Problem Relation Age of Onset    Cancer Mother    Cancer Sister    Aneurysm Sister    Kidney disease Paternal Aunt    Asthma Brother    Cancer - Lung Brother    Cancer Brother    Arthritis Brother     Social History Social History   Tobacco Use   Smoking status: Former    Current packs/day: 0.00    Average packs/day: 1 pack/day for 40.0 years (40.0 ttl pk-yrs)    Types: Cigarettes    Start date: 05/28/1976    Quit date: 05/28/2016    Years since quitting: 7.3   Smokeless tobacco: Never  Vaping Use   Vaping status: Never Used  Substance Use Topics   Alcohol use: Not Currently    Comment: quit- 2016   Drug use: No    Current Outpatient Medications  Medication Sig Dispense Refill   acetaminophen  (TYLENOL ) 325 MG tablet Take 2 tablets (650 mg total) by mouth every 6 (six) hours as needed for mild pain (or Fever >/= 101).     cetirizine  (ZYRTEC ) 10 MG tablet Take 10 mg by mouth daily.     Cholecalciferol  (VITAMIN D3) 125 MCG (5000 UT) CAPS Take 1 capsule (5,000 Units total) by mouth daily. 30 capsule 0   diltiazem  (CARDIZEM  CD) 180 MG 24 hr capsule Take 1 capsule (180 mg total) by mouth daily. 90 capsule 3   fenofibrate  (TRICOR ) 145 MG tablet Take 1 tablet (145 mg total) by mouth daily. 90 tablet 1   finasteride  (PROSCAR ) 5 MG tablet Take 1 tablet (5 mg total) by mouth daily. 30 tablet 3   fluticasone -salmeterol (ADVAIR) 100-50 MCG/ACT AEPB Inhale 1 puff into the lungs 2 (two) times daily. 1 each 3   ipratropium-albuterol  (DUONEB) 0.5-2.5 (3) MG/3ML SOLN USE 1 AMPULE IN NEBULIZER 4 TIMES DAILY AND AS NEEDED FOR SHORTNESS OF BREATH AND FOR WHEEZING 450 mL 0   meclizine  (ANTIVERT ) 25 MG tablet Take 1 tablet (25 mg total) by mouth 3 (three) times daily as needed for dizziness. 30 tablet 0   metoprolol  succinate (TOPROL  XL) 50 MG 24 hr tablet Take 1 tablet (50 mg total) by mouth daily. Take with or immediately following a meal. 30 tablet 2   Omega-3 Fatty Acids (FISH OIL ULTRA) 1400 MG CAPS Take 1 capsule by mouth in  the morning and at bedtime.     OXYGEN  Inhale 2 L into the lungs continuous. Hx of lung ca, copd, and emphysema     pantoprazole  (PROTONIX ) 40 MG tablet Take 1 tablet (40 mg total) by mouth daily. 30 tablet 1   polyethylene glycol (MIRALAX  / GLYCOLAX ) 17 g packet Take 17 g by mouth daily as needed for mild constipation.     rosuvastatin  (CRESTOR ) 40 MG tablet Take 1 tablet (40 mg total) by mouth daily. 90 tablet 3   tamsulosin  (FLOMAX ) 0.4 MG CAPS capsule Take 2 capsules (0.8 mg total) by mouth daily. 180 capsule 3   traZODone  (DESYREL ) 100 MG tablet Take 1 tablet (100 mg total) by mouth at bedtime. 90 tablet 1  warfarin (COUMADIN ) 5 MG tablet Take 1 tablet (5 mg total) by mouth daily. (Patient taking differently: Take 5 mg by mouth daily. 0.5 tab on Tuesday and Friday) 90 tablet 1   No current facility-administered medications for this visit.    No Known Allergies  Review of Systems  Constitutional:  Positive for activity change. Negative for appetite change and unexpected weight change.  HENT:  Negative for voice change.   Respiratory:  Positive for cough, shortness of breath and wheezing.   Cardiovascular:  Negative for chest pain.    BP 118/75   Pulse 69   Resp 18   Ht 5' 10 (1.778 m)   Wt 221 lb (100.2 kg)   SpO2 97% Comment: 2L O2  BMI 31.71 kg/m  Physical Exam Vitals reviewed.  Constitutional:      General: He is not in acute distress.    Appearance: He is ill-appearing.  HENT:     Head: Normocephalic and atraumatic.  Eyes:     General: No scleral icterus.    Extraocular Movements: Extraocular movements intact.  Cardiovascular:     Rate and Rhythm: Normal rate and regular rhythm.     Heart sounds: Normal heart sounds. No murmur heard. Pulmonary:     Comments: Diminished breath sounds left base, faint wheezing bilaterally Abdominal:     General: There is no distension.     Palpations: Abdomen is soft.  Lymphadenopathy:     Cervical: No cervical adenopathy.   Skin:    General: Skin is warm and dry.  Neurological:     General: No focal deficit present.     Mental Status: He is alert and oriented to person, place, and time.     Cranial Nerves: No cranial nerve deficit.    Diagnostic Tests: NUCLEAR MEDICINE PET SKULL BASE TO THIGH   TECHNIQUE: 11.1 mCi F-18 FDG was injected intravenously. Full-ring PET imaging was performed from the skull base to thigh after the radiotracer. CT data was obtained and used for attenuation correction and anatomic localization.   Fasting blood glucose:  mg/dl   COMPARISON:  CT chest 07/08/2023 and CT abdomen pelvis 07/14/2021. PET 10/15/2018.   FINDINGS: Mediastinal blood pool activity: SUV max 2.4   Liver activity: SUV max NA   NECK:   Misregistration artifact.  No definite abnormal hypermetabolism.   Incidental CT findings:   Left occipital lobe encephalomalacia.   CHEST:   Again, misregistration artifact degrades image quality hypermetabolic soft tissue nodule in the central right upper lobe, associated with a suture line, measures 2.2 x 2.6 cm (607/27), SUV max 9.4. Faint 6 mm nodule in the superior segment right lower lobe (607/32), too small for PET resolution. No additional abnormal hypermetabolism.   Incidental CT findings:   Atherosclerotic calcification of the aorta, aortic valve and coronary arteries. Heart is at the upper limits of normal in size to mildly enlarged. No pericardial or pleural effusion. Left upper lobectomy. Right upper lobe wedge resection. Centrilobular and paraseptal emphysema.   ABDOMEN/PELVIS:   Again, wrist registration artifact degrades image quality. 1.5 cm fat density left adrenal myelolipoma. No follow-up necessary. Focal hypermetabolism in the central prostate, SUV max 6.5. No additional abnormal hypermetabolism.   Incidental CT findings:   Liver is decreased in attenuation diffusely. Tiny left renal stone. Slightly elevated left hemidiaphragm.  Small bilateral inguinal hernias contain fat.   SKELETON:   No abnormal hypermetabolism.   Incidental CT findings:   Degenerative changes in the spine.   IMPRESSION:  1. Misregistration artifact degrades image quality. 2. Recurrent bronchogenic carcinoma in the medial right upper lobe, along the resection margin, as suspected on CT chest 07/08/2023. No evidence of distant metastatic disease. 3. Faint 6 mm nodule in the superior segment right lower lobe, too small for PET resolution. Recommend continued attention on follow-up. 4. Focal hypermetabolism in the central prostate. Please correlate clinically as malignancy cannot be definitively excluded. 5. Hepatic steatosis. 6. Tiny left renal stone. 7. Left adrenal myelolipoma. 8. Aortic atherosclerosis (ICD10-I70.0). Coronary artery calcification. 9.  Emphysema (ICD10-J43.9).     Electronically Signed   By: Newell Eke M.D.   On: 09/06/2023 15:55 I personally reviewed the CT and PET/CT images.  Previous lung resections.  Soft tissue density around staple line medial right upper lobe.  No adenopathy.  No evidence of distant metastatic disease.  Aortic and coronary atherosclerosis.  Emphysema.  Impression: Hamzah Savoca is a 73 year old man with a past medical history significant for tobacco abuse, lung cancer x 2, COPD, atrial fibrillation, hypertension, hyperlipidemia, stroke, obstructive sleep apnea, and oxygen  dependent chronic hypoxic respiratory failure.  He had a 40-pack-year history of smoking prior to quitting in 2017.  He had a left upper lobectomy for stage Ia non-small cell carcinoma in 2017.  In 2020 he had right VATS for wedge resection of right upper lobe adenocarcinoma and bleb resection.  Soft tissue density right upper lobe-likely recurrent adenocarcinoma.  Needs a biopsy for diagnostic purposes.  He is not a candidate for surgical resection.  Can potentially be a candidate for radiation.  I discussed the  postoperative procedure of electromagnetic navigational bronchoscopy and endobronchial ultrasound with Mr. Vanterpool and his family.  They understand this is an endoscopic procedure usually done on an outpatient basis but does require general anesthesia.  He has had issues with general anesthesia previously.  I informed him of the indications, risk, benefits, and alternatives.  They understand this is diagnostic and not therapeutic.  They understand the risks include those associated with general anesthesia.  Procedure specific risks include pneumothorax, bleeding, and failure to make a diagnosis.  They understand this nodule is in a difficult location and there is a significant probability we will be able to access it adequately.  He is on Coumadin .  Needs to hold that for 5 days prior to the procedure.  Dr. Denver notes from 09/03/2023 indicated that is reasonable.  Plan: CT chest super D protocol for planning for navigational bronchoscopy.  ENB and EBUS on Friday, 10/24/2023  Hold Coumadin  for 5 days prior to procedure  Elspeth JAYSON Millers, MD [facility] [phone]

## 2023-10-15 ENCOUNTER — Encounter: Payer: Self-pay | Admitting: *Deleted

## 2023-10-15 ENCOUNTER — Other Ambulatory Visit: Payer: Self-pay | Admitting: *Deleted

## 2023-10-15 DIAGNOSIS — Z5181 Encounter for therapeutic drug level monitoring: Secondary | ICD-10-CM

## 2023-10-15 DIAGNOSIS — C3491 Malignant neoplasm of unspecified part of right bronchus or lung: Secondary | ICD-10-CM

## 2023-10-16 ENCOUNTER — Encounter: Payer: Self-pay | Admitting: Internal Medicine

## 2023-10-17 ENCOUNTER — Telehealth: Payer: Self-pay | Admitting: Internal Medicine

## 2023-10-17 ENCOUNTER — Telehealth: Payer: Self-pay

## 2023-10-17 NOTE — Telephone Encounter (Signed)
Patient called stating to reschedule appt for lab and Cassie, PA until after the biopsy.  Rescheduled patient for 2/4.  Patients wife aware and was told to call if any concerns.

## 2023-10-17 NOTE — Telephone Encounter (Signed)
Copied from CRM [id number]. Topic: Clinical - Lab/Test Results >> Oct 17, 2023  8:40 AM [name] wrote: Reason for CRM: Hilda Lias from [employer] called wanting to know about previous fax sent confirming Patient for oxygen equipment. Stated a fax was sent and waiting on response. The direct fax line given was [phone].

## 2023-10-20 ENCOUNTER — Encounter (HOSPITAL_COMMUNITY): Payer: Self-pay

## 2023-10-20 NOTE — Telephone Encounter (Signed)
Copied from CRM [id number]. Topic: Clinical - Medical Advice >> Oct 20, 2023  8:57 AM [name] wrote: Reason for CRM: Birder Robson from [employer] calling to see about fax that is about patient to be give oxygen. She has contacted before and left fax number if needed. Again, fax number [phone]. This fax is needed to be able to give Patient oxygen order to be recertified.

## 2023-10-20 NOTE — Telephone Encounter (Signed)
Printed form off media, faxed again.

## 2023-10-20 NOTE — Pre-Procedure Instructions (Signed)
Surgical Instructions   Your procedure is scheduled on October 24, 2023. Report to [facility] Main Entrance "A" at 5:30 A.M., then check in with the Admitting office. Any questions or running late day of surgery: call [phone]  Questions prior to your surgery date: call [phone], Monday-Friday, 8am-4pm. If you experience any cold or flu symptoms such as cough, fever, chills, shortness of breath, etc. between now and your scheduled surgery, please notify us at the above number.     Remember:  Do not eat or drink after midnight the night before your surgery    Take these medicines the morning of surgery with A SIP OF WATER: atorvastatin (LIPITOR)  cetirizine (ZYRTEC)  diltiazem (CARDIZEM CD)  fenofibrate (TRICOR)  finasteride (PROSCAR)  fluticasone-salmeterol (ADVAIR) inhaler metoprolol succinate (TOPROL XL)  pantoprazole (PROTONIX)  rosuvastatin (CRESTOR)  tamsulosin [facility])    May take these medicines IF NEEDED: acetaminophen (TYLENOL)  ipratropium-albuterol (DUONEB) nebulizer meclizine (ANTIVERT)    Follow your surgeon's instructions on when to stop warfarin (COUMADIN).  If no instructions were given by your surgeon then you will need to call the office to get those instructions.     One week prior to surgery, STOP taking any Aspirin (unless otherwise instructed by your surgeon) Aleve, Naproxen, Ibuprofen, Motrin, Advil, Goody's, BC's, all herbal medications, fish oil, and non-prescription vitamins.                     Do NOT Smoke (Tobacco/Vaping) for 24 hours prior to your procedure.  If you use a CPAP at night, you may bring your mask/headgear for your overnight stay.   You will be asked to remove any contacts, glasses, piercing's, hearing aid's, dentures/partials prior to surgery. Please bring cases for these items if needed.    Patients discharged the day of surgery will not be allowed to drive home, and someone needs to stay with them for 24  hours.  SURGICAL WAITING ROOM VISITATION Patients may have no more than 2 support people in the waiting area - these visitors may rotate.   Pre-op nurse will coordinate an appropriate time for 1 ADULT support person, who may not rotate, to accompany patient in pre-op.  Children under the age of 33 must have an adult with them who is not the patient and must remain in the main waiting area with an adult.  If the patient needs to stay at the hospital during part of their recovery, the visitor guidelines for inpatient rooms apply.  Please refer to the [facility] website for the visitor guidelines for any additional information.   If you received a COVID test during your pre-op visit  it is requested that you wear a mask when out in public, stay away from anyone that may not be feeling well and notify your surgeon if you develop symptoms. If you have been in contact with anyone that has tested positive in the last 10 days please notify you surgeon.      Pre-operative CHG Bathing Instructions   You can play a key role in reducing the risk of infection after surgery. Your skin needs to be as free of germs as possible. You can reduce the number of germs on your skin by washing with CHG (chlorhexidine gluconate) soap before surgery. CHG is an antiseptic soap that kills germs and continues to kill germs even after washing.   DO NOT use if you have an allergy to chlorhexidine/CHG or antibacterial soaps. If your skin becomes reddened or  irritated, stop using the CHG and notify one of our RNs at [phone].              TAKE A SHOWER THE NIGHT BEFORE SURGERY AND THE DAY OF SURGERY    Please keep in mind the following:  DO NOT shave, including legs and underarms, 48 hours prior to surgery.   You may shave your face before/day of surgery.  Place clean sheets on your bed the night before surgery Use a clean washcloth (not used since being washed) for each shower. DO NOT sleep with pet's night  before surgery.  CHG Shower Instructions:  Wash your face and private area with normal soap. If you choose to wash your hair, wash first with your normal shampoo.  After you use shampoo/soap, rinse your hair and body thoroughly to remove shampoo/soap residue.  Turn the water OFF and apply half the bottle of CHG soap to a CLEAN washcloth.  Apply CHG soap ONLY FROM YOUR NECK DOWN TO YOUR TOES (washing for 3-5 minutes)  DO NOT use CHG soap on face, private areas, open wounds, or sores.  Pay special attention to the area where your surgery is being performed.  If you are having back surgery, having someone wash your back for you may be helpful. Wait 2 minutes after CHG soap is applied, then you may rinse off the CHG soap.  Pat dry with a clean towel  Put on clean pajamas    Additional instructions for the day of surgery: DO NOT APPLY any lotions, deodorants, cologne, or perfumes.   Do not wear jewelry or makeup Do not wear nail polish, gel polish, artificial nails, or any other type of covering on natural nails (fingers and toes) Do not bring valuables to the hospital. [facility] is not responsible for valuables/personal belongings. Put on clean/comfortable clothes.  Please brush your teeth.  Ask your nurse before applying any prescription medications to the skin.

## 2023-10-21 ENCOUNTER — Ambulatory Visit (HOSPITAL_COMMUNITY)
Admission: RE | Admit: 2023-10-21 | Discharge: 2023-10-21 | Disposition: A | Discharge status: Home / Self Care | Payer: Medicare Other | Source: Ambulatory Visit | Attending: Thoracic Surgery (Cardiothoracic Vascular Surgery) | Admitting: Thoracic Surgery (Cardiothoracic Vascular Surgery)

## 2023-10-21 ENCOUNTER — Encounter (HOSPITAL_COMMUNITY)
Admission: RE | Admit: 2023-10-21 | Discharge: 2023-10-21 | Disposition: A | Discharge status: Home / Self Care | Payer: Medicare Other | Source: Ambulatory Visit | Attending: Thoracic Surgery (Cardiothoracic Vascular Surgery) | Admitting: Thoracic Surgery (Cardiothoracic Vascular Surgery)

## 2023-10-21 ENCOUNTER — Encounter (HOSPITAL_COMMUNITY): Payer: Self-pay

## 2023-10-21 ENCOUNTER — Other Ambulatory Visit: Payer: Self-pay | Admitting: *Deleted

## 2023-10-21 ENCOUNTER — Other Ambulatory Visit: Payer: Self-pay

## 2023-10-21 VITALS — BP 109/68 | HR 66 | Temp 97.9°F

## 2023-10-21 DIAGNOSIS — Z01818 Encounter for other preprocedural examination: Secondary | ICD-10-CM | POA: Insufficient documentation

## 2023-10-21 DIAGNOSIS — C3491 Malignant neoplasm of unspecified part of right bronchus or lung: Secondary | ICD-10-CM

## 2023-10-21 DIAGNOSIS — Z5181 Encounter for therapeutic drug level monitoring: Secondary | ICD-10-CM | POA: Diagnosis not present

## 2023-10-21 DIAGNOSIS — J432 Centrilobular emphysema: Secondary | ICD-10-CM | POA: Diagnosis not present

## 2023-10-21 DIAGNOSIS — J439 Emphysema, unspecified: Secondary | ICD-10-CM | POA: Diagnosis not present

## 2023-10-21 DIAGNOSIS — R918 Other nonspecific abnormal finding of lung field: Secondary | ICD-10-CM | POA: Diagnosis not present

## 2023-10-21 HISTORY — DX: Prediabetes: R73.03

## 2023-10-21 HISTORY — DX: Cardiac arrhythmia, unspecified: I49.9

## 2023-10-21 HISTORY — DX: Sleep apnea, unspecified: G47.30

## 2023-10-21 HISTORY — DX: Peripheral vascular disease, unspecified: I73.9

## 2023-10-21 HISTORY — DX: Pneumonia, unspecified organism: J18.9

## 2023-10-21 HISTORY — DX: Abdominal aortic aneurysm, without rupture, unspecified: I71.40

## 2023-10-21 HISTORY — DX: Personal history of urinary calculi: Z87.442

## 2023-10-21 LAB — COMPREHENSIVE METABOLIC PANEL
ALT: 22 U/L (ref 0–44)
AST: 26 U/L (ref 15–41)
Albumin: 3.8 g/dL (ref 3.5–5.0)
Alkaline Phosphatase: 38 U/L (ref 38–126)
Anion gap: 10 (ref 5–15)
BUN: 14 mg/dL (ref 8–23)
CO2: 27 mmol/L (ref 22–32)
Calcium: 9.4 mg/dL (ref 8.9–10.3)
Chloride: 100 mmol/L (ref 98–111)
Creatinine, Ser: 1.37 mg/dL — ABNORMAL HIGH (ref 0.61–1.24)
GFR, Estimated: 54 mL/min — ABNORMAL LOW (ref >60)
Glucose, Bld: 91 mg/dL (ref 70–99)
Potassium: 4.5 mmol/L (ref 3.5–5.1)
Sodium: 137 mmol/L (ref 135–145)
Total Bilirubin: 1.5 mg/dL — ABNORMAL HIGH (ref 0.0–1.2)
Total Protein: 7.1 g/dL (ref 6.5–8.1)

## 2023-10-21 LAB — CBC
HCT: 42.9 % (ref 39.0–52.0)
Hemoglobin: 13.1 g/dL (ref 13.0–17.0)
MCH: 26.6 pg (ref 26.0–34.0)
MCHC: 30.5 g/dL (ref 30.0–36.0)
MCV: 87 fL (ref 80.0–100.0)
Platelets: 330 10*3/uL (ref 150–400)
RBC: 4.93 MIL/uL (ref 4.22–5.81)
RDW: 15 % (ref 11.5–15.5)
WBC: 6.7 10*3/uL (ref 4.0–10.5)
nRBC: 0 % (ref 0.0–0.2)

## 2023-10-21 LAB — PROTIME-INR
INR: 1.8 — ABNORMAL HIGH (ref 0.8–1.2)
Prothrombin Time: 21.2 s — ABNORMAL HIGH (ref 11.4–15.2)

## 2023-10-21 LAB — APTT: aPTT: 38 s — ABNORMAL HIGH (ref 24–36)

## 2023-10-21 NOTE — Progress Notes (Signed)
PCP - Dr. Trena Platt Cardiologist - Dr. Rollene Rotunda - Last office visit 09/03/2023  PPM/ICD - Denies Device Orders - n/a Rep Notified - n/a  Chest x-ray - 10/21/2023 EKG - 09/14/2023 Stress Test - Denies ECHO - 03/13/2021 Cardiac Cath - Denies  Sleep Study - Denies CPAP - n/a  Pt is Pre-DM  Last dose of GLP1 agonist- n/a GLP1 instructions: n/a  Blood Thinner Instructions: Pt instructed to hold Coumadin for 5 days. Last dose was Saturday, January 18th Aspirin Instructions: n/a   Per Careers adviser and confirmed with anesthesiologist, pt can have nothing to eat after 0600 morning of surgery and clear liquids until 0800 morning of surgery  COVID TEST- n/a   Anesthesia review: Yes. Hx of HTN, A. Fib, CVA, COPD and on continuous oxygen. Pt wears 2L [place] continuously including at night. He says when he over exerts himself he will increase oxygen to 2.5-3L until he recovers, then decreases it back to 2L.   Patient denies shortness of breath, fever, cough and chest pain at PAT appointment. Pt denies any respiratory illness/infection in the last two months   All instructions explained to the patient, with a verbal understanding of the material. Patient agrees to go over the instructions while at home for a better understanding. Patient also instructed to self quarantine after being tested for COVID-19. The opportunity to ask questions was provided.

## 2023-10-21 NOTE — Progress Notes (Signed)
Anesthesia Chart Review:  Case: [id number] Date/Time: 10/24/23 1345   Procedures:      VIDEO BRONCHOSCOPY WITH ENDOBRONCHIAL NAVIGATION     VIDEO BRONCHOSCOPY WITH ENDOBRONCHIAL ULTRASOUND   Anesthesia type: General   Pre-op diagnosis: RUL NODULE   [facility] / [facility]   Surgeons: Loreli Slot, MD       DISCUSSION: Patient is a 73 year old male scheduled for the above procedure.  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   Admission 09/14/23-09/16/23 for COPD exacerbation. Required up to 4L/O2. Managed with bronchodilators, oral doxycycline, steroids and oxygen supplementation. Weaned to 3L by discharge (baseline use 2L).   Last cardiology follow-up with Dr. Antoine Poche was on 09/03/23. Seen for elevated coronary calcium and PAF (diagnosed 07/2016). Previously a POET (Plain Old Exercise Treadmill) was negative for ischemia.  He was in the hospital with CVA in October 2022.  CT/MRI/MRI of the head showed acute left large PCA territory infarction.  He had left PCA occlusion at the P1 P2 junction.  Small acute right thalamic infarction.  No hemorrhage or mass-effect.  He did not receive tPA. Carotid Dopplers demonstrated no ICA stenosis.  Echocardiogram demonstrated an ejection fraction of 50 to 55% grade 1 diastolic dysfunction.  Dr. Antoine Poche noted pending PET scan due to concern for recurrent lung cancer and may be considered for resection, but more likely radiation if positive. Known  chronic dyspnea, but no new PND, orthopnea, palpitations, syncope. Also with some left arm discomfort which did not sound anginal. He was not planning to order a stress test unless the patient ended up needed an open surgical procedure (ie, lung resection). Continued primary risk reduction recommended. 4 cm AAA noted on 08/2022 [place], so plan to follow-up. Patient prefers to continue warfarin for PAF. He added, "PREOP: If he has lung biopsy he can hold his warfarin for 5 days prior or as directed by Dr. Dorris Fetch." 12 month follow-up planned.      He reported last warfarin dose 10/18/23. PT/INR on 10/21/23 was 21.1/1.8. PTT 38. Will repeat PT/INR on arrival.     Given afternoon surgery, Dr. Vladimir Faster ordered NPO for 6 hours. Patient advised no food or full liquds for at least 8 hours prior to surgery but can have clear liquids up to 6 hours prior so surgery based on surgeon orders.   Anesthesia team to evaluate on the day of surgery.     VS: BP 109/68   Pulse 66   Temp 36.6 C (Oral)   SpO2 100%   PROVIDERS: Anabel Halon, MD is PCP  [name], MD is HEM-ONC Rollene Rotunda, MD is cardiologist    LABS: Preoperative labs noted. Cr 1.37, previously 1.28 on 09/15/23. Total bili 1.5 with normal AST and ALT. (all labs ordered are listed, but only abnormal results are displayed)  Labs Reviewed  COMPREHENSIVE METABOLIC PANEL - Abnormal; Notable for the following components:      Result Value   Creatinine, Ser 1.37 (*)    Total Bilirubin 1.5 (*)    GFR, Estimated 54 (*)    All  other components within normal limits  PROTIME-INR - Abnormal; Notable for the following components:   Prothrombin Time 21.2 (*)    INR 1.8 (*)    All other components within normal limits  APTT - Abnormal; Notable for the following components:   aPTT 38 (*)    All other components within normal limits  CBC    IMAGES: CXR 10/21/23: IMPRESSION: 1. No acute findings. 2. Emphysema. 3. Known right upper  lobe opacity is not well delineated by radiograph.   Super D Chest CT 10/21/23: In process.  PET Scan 08/19/23: IMPRESSION: 1. Misregistration artifact degrades image quality. 2. Recurrent bronchogenic carcinoma in the medial right upper lobe, along the resection margin, as suspected on CT chest 07/08/2023. No evidence of distant metastatic disease. 3. Faint 6 mm nodule in the superior segment right lower lobe, too small for PET resolution. Recommend continued attention on follow-up. 4. Focal hypermetabolism in the central prostate. Please correlate clinically as malignancy cannot be definitively excluded. 5. Hepatic steatosis. 6. Tiny left renal stone. 7. Left adrenal myelolipoma. 8. Aortic atherosclerosis (ICD10-I70.0). Coronary artery calcification. 9.  Emphysema (ICD10-J43.9).  CT Chest 07/08/23: IMPRESSION: 1. Increasing masslike soft tissue along the suture margin in the central right upper lobe, indicative of disease recurrence. 2. Slightly enlarged spiculated nodule in the lateral right lower lobe. Additional nodules are stable. Recommend continued attention on follow-up. 3. Hepatic steatosis. 4. Left adrenal myelolipoma. 5. Aortic atherosclerosis (ICD10-I70.0). Coronary artery calcification. 6.  Emphysema (ICD10-J43.9).  US Aorta 09/17/22: IMPRESSION: - Fusiform aneurysm of the mid to distal abdominal aorta measuring up to 4.0 cm in maximal diameter. The proximal abdominal aorta is nearly completely obscured by bowel gas. Aneurysmal diameter is stable when compared to prior CT measurement. - 4 cm abdominal aortic aneurysm. Recommend follow-up every 12 months and vascular consultation. - Reference: J Am Coll Radiol 2013;10:789-794.    EKG: 09/14/23: Sinus rhythm Borderline right axis deviation Abnormal R-wave progression, late transition No significant change since prior 12/24 Confirmed by Meridee Score [phone]) on 09/14/2023 9:58:10 AM   CV: Echo with  Bubble Study 03/13/21: IMPRESSIONS   1. Left ventricular ejection fraction, by estimation, is 50 to 55%. The  left ventricle has low normal function. Left ventricular endocardial  border not optimally defined to evaluate regional wall motion. Left  ventricular diastolic parameters are  consistent with Grade I diastolic dysfunction (impaired relaxation).   2. Right ventricular systolic function is normal. The right ventricular  size is normal.   3. The mitral valve is normal in structure. No evidence of mitral valve  regurgitation. No evidence of mitral stenosis.   4. The aortic valve is tricuspid. Aortic valve regurgitation is not  visualized. No aortic stenosis is present.   5. The inferior vena cava is normal in size with greater than 50%  respiratory variability, suggesting right atrial pressure of 3 mmHg.   6. Agitated saline contrast bubble study was negative, with no evidence  of any interatrial shunt.    US Carotid 03/12/21: IMPRESSION: - Bilateral carotid atherosclerosis. No hemodynamically significant ICA stenosis. Degree of narrowing less than 50% bilaterally by ultrasound criteria. - Patent antegrade vertebral flow bilaterally   Past Medical History:  Diagnosis Date   A-fib (HCC) 2017   after last lung surgery patient had a history if Afib documented in hospital    AAA (abdominal aortic aneurysm) (HCC)    4.0 cm 09/17/22 [place]   Acute appendicitis 07/14/2021   Arthritis    back & legs (  knees)   Cancer (HCC)    lung   Complication of anesthesia    agitated upon waking from anesth.    Dyspnea    Dysrhythmia    A. Fib   Emphysema    Emphysema of lung (HCC)    History of kidney stones    Hyperlipidemia    Hypertension    Oxygen dependent    2L continuous   Peripheral vascular disease (HCC)    Aortic Aneurysm   Pneumonia    Pre-diabetes    Sleep apnea    No OSA, but wears 2L [place] O2 continuous including at night   Stroke [facility]) 2022   Right sided weakness,  decreased hearing, balance and memory loss   Tobacco abuse     Past Surgical History:  Procedure Laterality Date   APPENDECTOMY  07/15/2021   HERNIA REPAIR Bilateral 1999   umbilical   LAPAROSCOPIC APPENDECTOMY N/A 07/15/2021   Procedure: APPENDECTOMY LAPAROSCOPIC;  Surgeon: Lucretia Roers, MD;  Location: [facility];  Service: General;  Laterality: N/A;   VIDEO ASSISTED THORACOSCOPY Right 12/21/2018   Procedure: VIDEO ASSISTED THORACOSCOPY AND RESECTION OF BLEBS RIGHT LOWER LOBE;  Surgeon: Loreli Slot, MD;  Location: [facility];  Service: Thoracic;  Laterality: Right;   VIDEO ASSISTED THORACOSCOPY (VATS)/ LOBECTOMY Left 08/01/2016   Procedure: VIDEO ASSISTED THORACOSCOPY (VATS)/ LEFT UPPER LOBECTOMY with lymph node sampling and onQ placement;  Surgeon: Loreli Slot, MD;  Location: [facility];  Service: Thoracic;  Laterality: Left;   VIDEO ASSISTED THORACOSCOPY (VATS)/WEDGE RESECTION Right 12/09/2018   Procedure: VIDEO ASSISTED THORACOSCOPY (VATS)/WEDGE RESECTION;  Surgeon: Loreli Slot, MD;  Location: [facility];  Service: Thoracic;  Laterality: Right;   VIDEO BRONCHOSCOPY Bilateral 12/21/2018   Procedure: VIDEO BRONCHOSCOPY;  Surgeon: Loreli Slot, MD;  Location: [facility];  Service: Thoracic;  Laterality: Bilateral;   VIDEO BRONCHOSCOPY WITH ENDOBRONCHIAL NAVIGATION N/A 07/03/2016   Procedure: VIDEO BRONCHOSCOPY WITH ENDOBRONCHIAL NAVIGATION;  Surgeon: Loreli Slot, MD;  Location: [facility];  Service: Thoracic;  Laterality: N/A;   VIDEO BRONCHOSCOPY WITH INSERTION OF INTERBRONCHIAL VALVE (IBV) N/A 12/11/2018   Procedure: VIDEO BRONCHOSCOPY WITH INSERTION OF INTERBRONCHIAL VALVE (IBV);  Surgeon: Loreli Slot, MD;  Location: [facility] OR;  Service: Thoracic;  Laterality: N/A;   VIDEO BRONCHOSCOPY WITH INSERTION OF INTERBRONCHIAL VALVE (IBV) N/A 02/10/2019   Procedure: VIDEO BRONCHOSCOPY WITH REMOVAL OF INTERBRONCHIAL VALVE (IBV);  Surgeon: Loreli Slot, MD;   Location: [facility] OR;  Service: Thoracic;  Laterality: N/A;    MEDICATIONS:  acetaminophen (TYLENOL) 325 MG tablet   atorvastatin (LIPITOR) 80 MG tablet   cetirizine (ZYRTEC) 10 MG tablet   Cholecalciferol (VITAMIN D3) 125 MCG (5000 UT) CAPS   diltiazem (CARDIZEM CD) 180 MG 24 hr capsule   fenofibrate (TRICOR) 145 MG tablet   finasteride (PROSCAR) 5 MG tablet   fluticasone-salmeterol (ADVAIR) 100-50 MCG/ACT AEPB   ipratropium-albuterol (DUONEB) 0.5-2.5 (3) MG/3ML SOLN   meclizine (ANTIVERT) 25 MG tablet   metoprolol succinate (TOPROL XL) 50 MG 24 hr tablet   Omega-3 Fatty Acids (FISH OIL ULTRA PO)   OXYGEN   pantoprazole (PROTONIX) 40 MG tablet   polyethylene glycol (MIRALAX / GLYCOLAX) 17 g packet   rosuvastatin (CRESTOR) 40 MG tablet   tamsulosin (FLOMAX) 0.4 MG CAPS capsule   traZODone (DESYREL) 100 MG tablet   warfarin (COUMADIN) 5 MG tablet   No current facility-administered medications for this encounter.    Shonna Chock, PA-C Surgical Short Stay/Anesthesiology [facility] Phone [phone])  [phone] [facility] Phone [phone] 10/22/2023 4:42 PM

## 2023-10-22 ENCOUNTER — Encounter: Payer: Self-pay | Admitting: Internal Medicine

## 2023-10-22 ENCOUNTER — Encounter (HOSPITAL_COMMUNITY): Payer: Self-pay

## 2023-10-22 NOTE — Anesthesia Preprocedure Evaluation (Addendum)
Anesthesia Evaluation  Patient identified by MRN, date of birth, ID band Patient awake and Patient confused    Reviewed: Allergy & Precautions, NPO status , Patient's Chart, lab work & pertinent test results  Airway Mallampati: II  TM Distance: >3 FB Neck ROM: Full    Dental  (+) Dental Advisory Given   Pulmonary shortness of breath, sleep apnea , COPD,  oxygen dependent, former smoker   breath sounds clear to auscultation       Cardiovascular hypertension, Pt. on medications + CAD and + Peripheral Vascular Disease  + dysrhythmias Atrial Fibrillation  Rhythm:Regular Rate:Normal     Neuro/Psych CVA    GI/Hepatic negative GI ROS, Neg liver ROS,,,  Endo/Other  diabetes, Type 2    Renal/GU negative Renal ROS     Musculoskeletal  (+) Arthritis ,    Abdominal   Peds  Hematology negative hematology ROS (+)   Anesthesia Other Findings   Reproductive/Obstetrics                             Anesthesia Physical Anesthesia Plan  ASA: 4  Anesthesia Plan: General   Post-op Pain Management: Minimal or no pain anticipated   Induction: Intravenous  PONV Risk Score and Plan: 2 and Dexamethasone, Ondansetron and Treatment may vary due to age or medical condition  Airway Management Planned: Oral ETT  Additional Equipment: None  Intra-op Plan:   Post-operative Plan: Extubation in OR  Informed Consent: I have reviewed the patients History and Physical, chart, labs and discussed the procedure including the risks, benefits and alternatives for the proposed anesthesia with the patient or authorized representative who has indicated his/her understanding and acceptance.     Dental advisory given  Plan Discussed with: CRNA  Anesthesia Plan Comments: (  )       Anesthesia Quick Evaluation

## 2023-10-23 ENCOUNTER — Inpatient Hospital Stay: Payer: Medicare Other | Admitting: Physician Assistant

## 2023-10-23 ENCOUNTER — Inpatient Hospital Stay: Payer: Medicare Other

## 2023-10-23 NOTE — Progress Notes (Signed)
Patient's wife voiced understanding of disregarding the voicemail left of the patient's phone. voiced understanding for patient to take his metoprolol in the morning and NOTHING to eat or drink after midnight. New arrival time of 0900.

## 2023-10-24 ENCOUNTER — Ambulatory Visit (HOSPITAL_COMMUNITY)
Admission: RE | Admit: 2023-10-24 | Discharge: 2023-10-24 | Disposition: A | Discharge status: Home / Self Care | Payer: Medicare Other | Attending: Thoracic Surgery (Cardiothoracic Vascular Surgery) | Admitting: Thoracic Surgery (Cardiothoracic Vascular Surgery)

## 2023-10-24 ENCOUNTER — Ambulatory Visit (HOSPITAL_COMMUNITY): Payer: Medicare Other | Admitting: Vascular Surgery

## 2023-10-24 ENCOUNTER — Other Ambulatory Visit: Payer: Self-pay

## 2023-10-24 ENCOUNTER — Encounter (HOSPITAL_COMMUNITY)
Admission: RE | Disposition: A | Discharge status: Home / Self Care | Payer: Self-pay | Source: Home / Self Care | Attending: Thoracic Surgery (Cardiothoracic Vascular Surgery)

## 2023-10-24 ENCOUNTER — Ambulatory Visit (HOSPITAL_COMMUNITY): Payer: Medicare Other

## 2023-10-24 ENCOUNTER — Ambulatory Visit (HOSPITAL_BASED_OUTPATIENT_CLINIC_OR_DEPARTMENT_OTHER): Payer: Medicare Other | Admitting: Anesthesiology

## 2023-10-24 ENCOUNTER — Encounter (HOSPITAL_COMMUNITY): Payer: Self-pay | Admitting: Thoracic Surgery (Cardiothoracic Vascular Surgery)

## 2023-10-24 DIAGNOSIS — I10 Essential (primary) hypertension: Secondary | ICD-10-CM

## 2023-10-24 DIAGNOSIS — I2510 Atherosclerotic heart disease of native coronary artery without angina pectoris: Secondary | ICD-10-CM | POA: Diagnosis not present

## 2023-10-24 DIAGNOSIS — C3491 Malignant neoplasm of unspecified part of right bronchus or lung: Secondary | ICD-10-CM | POA: Diagnosis not present

## 2023-10-24 DIAGNOSIS — Z9981 Dependence on supplemental oxygen: Secondary | ICD-10-CM | POA: Insufficient documentation

## 2023-10-24 DIAGNOSIS — C771 Secondary and unspecified malignant neoplasm of intrathoracic lymph nodes: Secondary | ICD-10-CM | POA: Diagnosis not present

## 2023-10-24 DIAGNOSIS — Z7951 Long term (current) use of inhaled steroids: Secondary | ICD-10-CM | POA: Diagnosis not present

## 2023-10-24 DIAGNOSIS — Z01818 Encounter for other preprocedural examination: Secondary | ICD-10-CM

## 2023-10-24 DIAGNOSIS — Z5181 Encounter for therapeutic drug level monitoring: Secondary | ICD-10-CM

## 2023-10-24 DIAGNOSIS — J9611 Chronic respiratory failure with hypoxia: Secondary | ICD-10-CM | POA: Insufficient documentation

## 2023-10-24 DIAGNOSIS — G4733 Obstructive sleep apnea (adult) (pediatric): Secondary | ICD-10-CM | POA: Diagnosis not present

## 2023-10-24 DIAGNOSIS — I4891 Unspecified atrial fibrillation: Secondary | ICD-10-CM | POA: Diagnosis not present

## 2023-10-24 DIAGNOSIS — R911 Solitary pulmonary nodule: Secondary | ICD-10-CM

## 2023-10-24 DIAGNOSIS — Z85118 Personal history of other malignant neoplasm of bronchus and lung: Secondary | ICD-10-CM | POA: Insufficient documentation

## 2023-10-24 DIAGNOSIS — J439 Emphysema, unspecified: Secondary | ICD-10-CM | POA: Insufficient documentation

## 2023-10-24 DIAGNOSIS — E119 Type 2 diabetes mellitus without complications: Secondary | ICD-10-CM | POA: Diagnosis not present

## 2023-10-24 DIAGNOSIS — Z87891 Personal history of nicotine dependence: Secondary | ICD-10-CM | POA: Diagnosis not present

## 2023-10-24 DIAGNOSIS — J449 Chronic obstructive pulmonary disease, unspecified: Secondary | ICD-10-CM | POA: Diagnosis not present

## 2023-10-24 DIAGNOSIS — C801 Malignant (primary) neoplasm, unspecified: Secondary | ICD-10-CM | POA: Diagnosis not present

## 2023-10-24 DIAGNOSIS — R918 Other nonspecific abnormal finding of lung field: Secondary | ICD-10-CM | POA: Diagnosis not present

## 2023-10-24 HISTORY — PX: VIDEO BRONCHOSCOPY WITH ENDOBRONCHIAL NAVIGATION: SHX6175

## 2023-10-24 HISTORY — PX: VIDEO BRONCHOSCOPY WITH ENDOBRONCHIAL ULTRASOUND: SHX6177

## 2023-10-24 LAB — PROTIME-INR
INR: 1.2 (ref 0.8–1.2)
Prothrombin Time: 15.2 s (ref 11.4–15.2)

## 2023-10-24 SURGERY — VIDEO BRONCHOSCOPY WITH ENDOBRONCHIAL NAVIGATION
Anesthesia: General

## 2023-10-24 MED ORDER — SODIUM CHLORIDE 0.9 % IV SOLN: INTRAVENOUS | Status: DC | PRN

## 2023-10-24 MED ORDER — PHENYLEPHRINE 80 MCG/ML (10ML) SYRINGE FOR IV PUSH (FOR BLOOD PRESSURE SUPPORT)
PREFILLED_SYRINGE | INTRAVENOUS | Status: AC
  Filled 2023-10-24: qty 10

## 2023-10-24 MED ORDER — ONDANSETRON HCL 4 MG/2ML IJ SOLN
INTRAMUSCULAR | Status: DC | PRN
  Administered 2023-10-24: 4 mg via INTRAVENOUS

## 2023-10-24 MED ORDER — PHENYLEPHRINE HCL-NACL 20-0.9 MG/250ML-% IV SOLN
INTRAVENOUS | Status: DC | PRN
  Administered 2023-10-24: 40 ug/min via INTRAVENOUS

## 2023-10-24 MED ORDER — LACTATED RINGERS IV SOLN: INTRAVENOUS | Status: DC | PRN

## 2023-10-24 MED ORDER — EPINEPHRINE PF 1 MG/ML IJ SOLN
INTRAMUSCULAR | Status: AC
  Filled 2023-10-24: qty 1

## 2023-10-24 MED ORDER — PROPOFOL 500 MG/50ML IV EMUL
INTRAVENOUS | Status: DC | PRN
  Administered 2023-10-24: 200 ug/kg/min via INTRAVENOUS

## 2023-10-24 MED ORDER — EPINEPHRINE PF 1 MG/ML IJ SOLN
INTRAMUSCULAR | Status: DC | PRN
  Administered 2023-10-24: 1 mg

## 2023-10-24 MED ORDER — PROPOFOL 10 MG/ML IV BOLUS
INTRAVENOUS | Status: DC | PRN
  Administered 2023-10-24: 150 mg via INTRAVENOUS

## 2023-10-24 MED ORDER — DEXAMETHASONE SODIUM PHOSPHATE 10 MG/ML IJ SOLN
INTRAMUSCULAR | Status: DC | PRN
  Administered 2023-10-24: 5 mg via INTRAVENOUS

## 2023-10-24 MED ORDER — FENTANYL CITRATE (PF) 250 MCG/5ML IJ SOLN
INTRAMUSCULAR | Status: DC | PRN
  Administered 2023-10-24: 50 ug via INTRAVENOUS

## 2023-10-24 MED ORDER — VASOPRESSIN 20 UNIT/ML IV SOLN
INTRAVENOUS | Status: DC | PRN
  Administered 2023-10-24: 1 [IU] via INTRAVENOUS
  Administered 2023-10-24 (×2): 2 [IU] via INTRAVENOUS

## 2023-10-24 MED ORDER — DIPHENHYDRAMINE HCL 50 MG/ML IJ SOLN
12.5000 mg | Freq: Once | INTRAMUSCULAR | Status: AC
  Administered 2023-10-24: 12.5 mg via INTRAVENOUS

## 2023-10-24 MED ORDER — IPRATROPIUM-ALBUTEROL 0.5-2.5 (3) MG/3ML IN SOLN
RESPIRATORY_TRACT | Status: AC
  Filled 2023-10-24: qty 3

## 2023-10-24 MED ORDER — PROPOFOL 10 MG/ML IV BOLUS
INTRAVENOUS | Status: AC
  Filled 2023-10-24: qty 20

## 2023-10-24 MED ORDER — EPHEDRINE 5 MG/ML INJ
INTRAVENOUS | Status: AC
  Filled 2023-10-24: qty 5

## 2023-10-24 MED ORDER — FENTANYL CITRATE (PF) 250 MCG/5ML IJ SOLN
INTRAMUSCULAR | Status: AC
  Filled 2023-10-24: qty 5

## 2023-10-24 MED ORDER — IPRATROPIUM-ALBUTEROL 0.5-2.5 (3) MG/3ML IN SOLN
RESPIRATORY_TRACT | Status: AC
  Administered 2023-10-24: 3 mL via RESPIRATORY_TRACT
  Filled 2023-10-24: qty 3

## 2023-10-24 MED ORDER — IPRATROPIUM-ALBUTEROL 0.5-2.5 (3) MG/3ML IN SOLN: 3.0000 mL | Freq: Once | RESPIRATORY_TRACT | Status: AC

## 2023-10-24 MED ORDER — CHLORHEXIDINE GLUCONATE 0.12 % MT SOLN
15.0000 mL | Freq: Once | OROMUCOSAL | Status: AC
  Administered 2023-10-24: 15 mL via OROMUCOSAL
  Filled 2023-10-24: qty 15

## 2023-10-24 MED ORDER — IPRATROPIUM-ALBUTEROL 0.5-2.5 (3) MG/3ML IN SOLN
3.0000 mL | Freq: Once | RESPIRATORY_TRACT | Status: AC
Start: 2023-10-24 — End: 2023-10-24
  Administered 2023-10-24: 3 mL via RESPIRATORY_TRACT
  Filled 2023-10-24: qty 3

## 2023-10-24 MED ORDER — EPHEDRINE SULFATE (PRESSORS) 50 MG/ML IJ SOLN
INTRAMUSCULAR | Status: DC | PRN
  Administered 2023-10-24: 1 mg via INTRAVENOUS

## 2023-10-24 MED ORDER — HALOPERIDOL LACTATE 5 MG/ML IJ SOLN
1.0000 mg | Freq: Once | INTRAMUSCULAR | Status: AC
  Administered 2023-10-24: 1 mg via INTRAVENOUS
  Filled 2023-10-24: qty 0.2

## 2023-10-24 MED ORDER — 0.9 % SODIUM CHLORIDE (POUR BTL) OPTIME
TOPICAL | Status: DC | PRN
  Administered 2023-10-24: 1000 mL

## 2023-10-24 MED ORDER — PROPOFOL 1000 MG/100ML IV EMUL
INTRAVENOUS | Status: AC
  Filled 2023-10-24: qty 100

## 2023-10-24 MED ORDER — ALBUMIN HUMAN 5 % IV SOLN: INTRAVENOUS | Status: DC | PRN

## 2023-10-24 MED ORDER — ORAL CARE MOUTH RINSE
15.0000 mL | Freq: Once | OROMUCOSAL | Status: AC
Start: 2023-10-24 — End: 2023-10-24

## 2023-10-24 MED ORDER — LIDOCAINE 2% (20 MG/ML) 5 ML SYRINGE
INTRAMUSCULAR | Status: DC | PRN
  Administered 2023-10-24: 60 mg via INTRAVENOUS

## 2023-10-24 MED ORDER — DIPHENHYDRAMINE HCL 50 MG/ML IJ SOLN
INTRAMUSCULAR | Status: AC
  Filled 2023-10-24: qty 1

## 2023-10-24 MED ORDER — VASOPRESSIN 20 UNIT/ML IV SOLN
INTRAVENOUS | Status: AC
  Filled 2023-10-24: qty 1

## 2023-10-24 MED ORDER — DEXMEDETOMIDINE HCL IN NACL 80 MCG/20ML IV SOLN
INTRAVENOUS | Status: AC
  Filled 2023-10-24: qty 20

## 2023-10-24 MED ORDER — PHENYLEPHRINE 80 MCG/ML (10ML) SYRINGE FOR IV PUSH (FOR BLOOD PRESSURE SUPPORT)
PREFILLED_SYRINGE | INTRAVENOUS | Status: DC | PRN
  Administered 2023-10-24 (×2): 160 ug via INTRAVENOUS

## 2023-10-24 MED ORDER — LACTATED RINGERS IV SOLN: INTRAVENOUS | Status: DC

## 2023-10-24 MED ORDER — SUGAMMADEX SODIUM 200 MG/2ML IV SOLN
INTRAVENOUS | Status: DC | PRN
  Administered 2023-10-24: 200 mg via INTRAVENOUS

## 2023-10-24 MED ORDER — ROCURONIUM BROMIDE 10 MG/ML (PF) SYRINGE
PREFILLED_SYRINGE | INTRAVENOUS | Status: DC | PRN
  Administered 2023-10-24: 10 mg via INTRAVENOUS
  Administered 2023-10-24: 50 mg via INTRAVENOUS

## 2023-10-24 SURGICAL SUPPLY — 56 items
ADAPTER BRONCHOSCOPE OLYMP 190 (ADAPTER) IMPLANT
ADAPTER BRONCHOSCOPE OLYMPUS (ADAPTER) ×1 IMPLANT
ADAPTER VALVE BIOPSY EBUS (MISCELLANEOUS) IMPLANT
BLADE CLIPPER SURG (BLADE) ×1 IMPLANT
BRUSH BIOPSY BRONCH 10 SDTNB (MISCELLANEOUS) IMPLANT
BRUSH CYTOL CELLEBRITY 1.5X140 (MISCELLANEOUS) IMPLANT
BRUSH SUPERTRAX BIOPSY (INSTRUMENTS) IMPLANT
BRUSH SUPERTRAX NDL-TIP CYTO (INSTRUMENTS) ×1 IMPLANT
CANISTER SUCT 3000ML PPV (MISCELLANEOUS) ×2 IMPLANT
CNTNR URN SCR LID CUP LEK RST (MISCELLANEOUS) ×3 IMPLANT
COVER BACK TABLE 60X90IN (DRAPES) ×2 IMPLANT
FILTER STRAW FLUID ASPIR (MISCELLANEOUS) ×1 IMPLANT
FORCEPS BIOP RJ4 1.8 (CUTTING FORCEPS) IMPLANT
FORCEPS BIOP SUPERTRX PREMAR (INSTRUMENTS) IMPLANT
FORCEPS RADIAL JAW LRG 4 PULM (INSTRUMENTS) IMPLANT
GAUZE 4X4 16PLY ~~LOC~~+RFID DBL (SPONGE) ×1 IMPLANT
GAUZE SPONGE 4X4 12PLY STRL (GAUZE/BANDAGES/DRESSINGS) ×1 IMPLANT
GLOVE SS BIOGEL STRL SZ 7.5 (GLOVE) ×3 IMPLANT
GLOVE SURG SIGNA 7.5 PF LTX (GLOVE) ×1 IMPLANT
GOWN STRL REUS W/ TWL XL LVL3 (GOWN DISPOSABLE) ×2 IMPLANT
KIT CLEAN ENDO COMPLIANCE (KITS) ×3 IMPLANT
KIT ILLUMISITE 180 PROCEDURE (KITS) IMPLANT
KIT ILLUMISITE 90 PROCEDURE (KITS) IMPLANT
KIT TURNOVER KIT B (KITS) ×2 IMPLANT
MARKER SKIN DUAL TIP RULER LAB (MISCELLANEOUS) ×2 IMPLANT
NDL ASPIRATION VIZISHOT 19G (NEEDLE) IMPLANT
NDL ASPIRATION VIZISHOT 21G (NEEDLE) ×1 IMPLANT
NDL BLUNT 18X1 FOR OR ONLY (NEEDLE) IMPLANT
NDL SUPERTRX PREMARK BIOPSY (NEEDLE) IMPLANT
NEEDLE ASPIRATION VIZISHOT 19G (NEEDLE) ×1
NEEDLE ASPIRATION VIZISHOT 21G (NEEDLE) ×1
NEEDLE BLUNT 18X1 FOR OR ONLY (NEEDLE)
NEEDLE SUPERTRX PREMARK BIOPSY (NEEDLE)
NS IRRIG 1000ML POUR BTL (IV SOLUTION) ×2 IMPLANT
OIL SILICONE PENTAX (PARTS (SERVICE/REPAIRS)) ×2 IMPLANT
PAD ARMBOARD 7.5X6 YLW CONV (MISCELLANEOUS) ×4 IMPLANT
PATCHES PATIENT (LABEL) ×3 IMPLANT
SPONGE T-LAP 18X18 ~~LOC~~+RFID (SPONGE) IMPLANT
SPONGE T-LAP 4X18 ~~LOC~~+RFID (SPONGE) IMPLANT
SYR 20ML ECCENTRIC (SYRINGE) ×3 IMPLANT
SYR 20ML LL LF (SYRINGE) ×3 IMPLANT
SYR 30ML LL (SYRINGE) IMPLANT
SYR 30ML SLIP (SYRINGE) IMPLANT
SYR 3ML LL SCALE MARK (SYRINGE) IMPLANT
SYR 50ML LL SCALE MARK (SYRINGE) ×1 IMPLANT
SYR 5ML LL (SYRINGE) ×2 IMPLANT
SYR 5ML LUER SLIP (SYRINGE) ×1 IMPLANT
SYR TB 1ML LUER SLIP (SYRINGE) IMPLANT
TOWEL GREEN STERILE (TOWEL DISPOSABLE) ×2 IMPLANT
TOWEL GREEN STERILE FF (TOWEL DISPOSABLE) ×2 IMPLANT
TRAP SPECIMEN MUCUS 40CC (MISCELLANEOUS) ×2 IMPLANT
TUBE CONNECTING 20X1/4 (TUBING) ×3 IMPLANT
UNDERPAD 30X36 HEAVY ABSORB (UNDERPADS AND DIAPERS) ×1 IMPLANT
VALVE BIOPSY SINGLE USE (MISCELLANEOUS) ×2 IMPLANT
VALVE SUCTION BRONCHIO DISP (MISCELLANEOUS) ×2 IMPLANT
WATER STERILE IRR 1000ML POUR (IV SOLUTION) ×2 IMPLANT

## 2023-10-24 NOTE — Interval H&P Note (Signed)
History and Physical Interval Note:  10/24/2023 9:44 AM  Randall Sullivan  has presented today for surgery, with the diagnosis of RUL NODULE.  The various methods of treatment have been discussed with the patient and family. After consideration of risks, benefits and other options for treatment, the patient has consented to  Procedure(s): VIDEO BRONCHOSCOPY WITH ENDOBRONCHIAL NAVIGATION (N/A) VIDEO BRONCHOSCOPY WITH ENDOBRONCHIAL ULTRASOUND (N/A) as a surgical intervention.  The patient's history has been reviewed, patient examined, no change in status, stable for surgery.  I have reviewed the patient's chart and labs.  Questions were answered to the patient's satisfaction.     Loreli Slot

## 2023-10-24 NOTE — Anesthesia Procedure Notes (Addendum)
Procedure Name: Intubation Date/Time: 10/24/2023 11:04 AM  Performed by: Flonnie Hailstone, CRNAPre-anesthesia Checklist: Patient identified, Emergency Drugs available, Suction available and Patient being monitored Patient Re-evaluated:Patient Re-evaluated prior to induction Oxygen Delivery Method: Circle system utilized Preoxygenation: Pre-oxygenation with 100% oxygen Induction Type: IV induction Ventilation: Mask ventilation without difficulty Laryngoscope Size: Mac and 4 Grade View: Grade I Tube type: Oral Number of attempts: 1 Airway Equipment and Method: Stylet Placement Confirmation: ETT inserted through vocal cords under direct vision, positive ETCO2 and breath sounds checked- equal and bilateral Secured at: 22 cm Tube secured with: Tape Dental Injury: Teeth and Oropharynx as per pre-operative assessment  Comments: intubated by C. Cristen Bredeson, CRNA; ebbs  auscultated by dr.R fitzgerald. Pt wheezing throughout anterior fields

## 2023-10-24 NOTE — Op Note (Unsigned)
NAME: Randall Sullivan, Randall Sullivan MEDICAL RECORD NO: [id number] ACCOUNT NO: [id number] DATE OF BIRTH: [birth year] FACILITY[facility] LOCATION: [facility] PHYSICIAN: Salvatore Decent. Dorris Fetch, MD  Operative Report   DATE OF PROCEDURE: 10/24/2023  PREOPERATIVE DIAGNOSIS:  Right upper lobe nodule, suspect recurrent lung cancer.  POSTOPERATIVE DIAGNOSIS:  Right upper lobe nodule, suspect recurrent lung cancer.  PROCEDURE:  Electromagnetic navigational bronchoscopy and endobronchial ultrasound with aspiration of right paratracheal mass.  SURGEON:  Salvatore Decent. Dorris Fetch, MD.  ASSISTANT:  None.  ANESTHESIA:  General.  FINDINGS:  Second aspiration of paratracheal mass was deemed adequate for diagnosis.  CLINICAL NOTE:  The patient is a 73 year old gentleman with two prior lung cancers who presented with an increasing soft tissue density at a staple line in the right upper lobe from his previous wedge resection.  The area was hypermetabolic on PET CT.   He was advised to undergo navigational bronchoscopy and endobronchial ultrasound for diagnostic purposes.  The indications, risks, benefits, and alternatives were discussed in detail with the patient.  He understood and accepted the risks and agreed to  proceed.  OPERATIVE NOTE:  The patient was brought to the operating room on 10/24/2023.  He had induction of general anesthesia.  A Bair Hugger was placed for active warming.  Sequential compression devices were placed on the calves for DVT prophylaxis.  A timeout  was performed.  Planning for navigational bronchoscopy was done on the console prior to induction.  A timeout was performed. Flexible fiberoptic bronchoscopy was performed via the endotracheal tube. There were extensive thick clear and yellow secretions throughout both lungs.  These were cleared with multiple irrigations with saline.  A locatable guide  for navigation was placed and registration was performed.  The bronchoscope then was directed to the right  upper lobe bronchus.  An attempt was made to cannulate the appropriate subsegmental bronchus.  This lesion was very central and very difficult to  access.  Numerous attempts were made to get the catheter to reach the appropriate subsegmental bronchus and be in line with the nodule, but the catheter always ended up lateral to the shadow of the nodule.  Appropriate alignment could not be obtained.   Attempts were made to advance using fluoroscopy and just the sheath and not the interlocatable guide, but still we were unable to access the nodule with navigational bronchoscopy.  The bronchoscope was removed.  A 2-mm bronchoscope was inserted.  This  was able to advance further, but the tumor was not visible endobronchially.  The endobronchial ultrasound scope then was placed and attention was turned to the right paratracheal area where the nodule was.  Two aspirations were performed initially at  different angles with the second one being slightly more posteriorly oriented and the second specimen was deemed adequate for diagnosis, but only a few cells and not enough for additional testing.  Three additional aspirations were performed at the same  angle, two with suction applied and one without suction.  All of those specimens were placed into cell block.  All of the needle aspirations were done with real-time ultrasound visualization.  There was some minor bleeding afterwards that cleared with  irrigation with a dilute epinephrine solution and there was no ongoing bleeding at the end of the procedure.  The patient then was extubated in the operating room and taken to the post-anesthetic care unit in fair condition.   NIK D: 10/24/2023 3:39:42 pm T: 10/24/2023 6:32:00 pm  JOB: [id number]/ [id number]

## 2023-10-24 NOTE — Transfer of Care (Signed)
Immediate Anesthesia Transfer of Care Note  Patient: Randall Sullivan  Procedure(s) Performed: VIDEO BRONCHOSCOPY WITH ENDOBRONCHIAL NAVIGATION VIDEO BRONCHOSCOPY WITH ENDOBRONCHIAL ULTRASOUND  Patient Location: PACU  Anesthesia Type:General  Level of Consciousness: awake, drowsy, and confused  Airway & Oxygen Therapy: Patient Spontanous Breathing and Patient connected to face mask oxygen  Post-op Assessment: Report given to RN and Post -op Vital signs reviewed and stable  Post vital signs: Reviewed and stable  Last Vitals:  Vitals Value Taken Time  BP 129/102 10/24/23 1255  Temp 98   Pulse 81 10/24/23 1310  Resp 21 10/24/23 1310  SpO2 97 % 10/24/23 1310  Vitals shown include unfiled device data.   Dr. Glade Stanford at bedside; pt agitated receiving duoneb; airway patent    Complications: No notable events documented.

## 2023-10-24 NOTE — Discharge Instructions (Addendum)
Do not drive or engage in heavy physical activity for 24 hours  You may resume normal activities tomorrow  You may use acetaminophen (Tylenol) if needed for discomfort.  You may use an over-the-counter cough medication or throat lozenges if needed.  Resume warfarin (Coumadin) tomorrow with your usual dose  You may cough up small amounts of blood over the next few days.    Call [phone] if you develop chest pain, shortness of breath, fever > 101 F or cough up more than a tablespoon of blood. If in distress call 911.  My office will contact you with a follow up appointment

## 2023-10-24 NOTE — Brief Op Note (Addendum)
10/24/2023  12:47 PM  PATIENT:  Randall Sullivan  73 y.o. male  PRE-OPERATIVE DIAGNOSIS:  Right Upper Lobe  NODULE  POST-OPERATIVE DIAGNOSIS:  Right Upper Lobe NODULE  PROCEDURE:  Procedure(s): VIDEO BRONCHOSCOPY WITH ENDOBRONCHIAL NAVIGATION (N/A) VIDEO BRONCHOSCOPY WITH ENDOBRONCHIAL ULTRASOUND (N/A) Needle aspirations of paratracheal mass  SURGEON:  Surgeons and Role:    * Loreli Slot, MD - Primary  PHYSICIAN ASSISTANT:   ASSISTANTS: none   ANESTHESIA:   general  EBL: minimal  BLOOD ADMINISTERED:none  DRAINS: none   LOCAL MEDICATIONS USED:  NONE  SPECIMEN:  Source of Specimen:  RUL/ paratracheal mass  DISPOSITION OF SPECIMEN:  PATHOLOGY  COUNTS:  NO endoscopic  TOURNIQUET:  * No tourniquets in log *  DICTATION: .Other Dictation: Dictation Number -[id number]  PLAN OF CARE: Discharge to home after PACU  PATIENT DISPOSITION:  PACU - hemodynamically stable.   Delay start of Pharmacological VTE agent (>24hrs) due to surgical blood loss or risk of bleeding: not applicable

## 2023-10-25 ENCOUNTER — Encounter (HOSPITAL_COMMUNITY): Payer: Self-pay | Admitting: Thoracic Surgery (Cardiothoracic Vascular Surgery)

## 2023-10-26 ENCOUNTER — Encounter: Payer: Self-pay | Admitting: Internal Medicine

## 2023-10-27 ENCOUNTER — Other Ambulatory Visit: Payer: Self-pay

## 2023-10-27 ENCOUNTER — Telehealth: Payer: Self-pay | Admitting: Internal Medicine

## 2023-10-27 DIAGNOSIS — N401 Enlarged prostate with lower urinary tract symptoms: Secondary | ICD-10-CM

## 2023-10-27 LAB — CYTOLOGY - NON PAP

## 2023-10-27 MED ORDER — TAMSULOSIN HCL 0.4 MG PO CAPS: 0.8000 mg | ORAL_CAPSULE | Freq: Every day | ORAL | 3 refills | Status: AC

## 2023-10-27 NOTE — Anesthesia Postprocedure Evaluation (Signed)
Anesthesia Post Note  Patient: Randall Sullivan  Procedure(s) Performed: VIDEO BRONCHOSCOPY WITH ENDOBRONCHIAL NAVIGATION VIDEO BRONCHOSCOPY WITH ENDOBRONCHIAL ULTRASOUND     Patient location during evaluation: PACU Anesthesia Type: General Level of consciousness: awake and alert Pain management: pain level controlled Vital Signs Assessment: post-procedure vital signs reviewed and stable Respiratory status: spontaneous breathing, nonlabored ventilation, respiratory function stable and patient connected to nasal cannula oxygen Cardiovascular status: blood pressure returned to baseline and stable Postop Assessment: no apparent nausea or vomiting Anesthetic complications: no   There were no known notable events for this encounter.  Last Vitals:  Vitals:   10/24/23 1600 10/24/23 1615  BP: (!) 108/54 (!) 115/58  Pulse: 75 77  Resp: 14 14  Temp:  36.6 C  SpO2: 100% 97%    Last Pain:  Vitals:   10/24/23 1615  PainSc: 0-No pain                 [name]

## 2023-10-28 ENCOUNTER — Ambulatory Visit (HOSPITAL_COMMUNITY)
Admission: RE | Admit: 2023-10-28 | Discharge: 2023-10-28 | Disposition: A | Discharge status: Home / Self Care | Payer: Medicare Other | Source: Ambulatory Visit | Attending: Cardiology | Admitting: Cardiology

## 2023-10-28 DIAGNOSIS — I714 Abdominal aortic aneurysm, without rupture, unspecified: Secondary | ICD-10-CM | POA: Diagnosis not present

## 2023-10-29 ENCOUNTER — Encounter: Payer: Self-pay | Admitting: *Deleted

## 2023-10-29 ENCOUNTER — Other Ambulatory Visit (HOSPITAL_COMMUNITY): Payer: Self-pay | Admitting: *Deleted

## 2023-10-29 DIAGNOSIS — I714 Abdominal aortic aneurysm, without rupture, unspecified: Secondary | ICD-10-CM

## 2023-11-02 ENCOUNTER — Encounter: Payer: Self-pay | Admitting: Internal Medicine

## 2023-11-03 ENCOUNTER — Other Ambulatory Visit: Payer: Self-pay

## 2023-11-03 ENCOUNTER — Encounter (HOSPITAL_COMMUNITY): Payer: Self-pay | Admitting: Thoracic Surgery (Cardiothoracic Vascular Surgery)

## 2023-11-03 DIAGNOSIS — N401 Enlarged prostate with lower urinary tract symptoms: Secondary | ICD-10-CM

## 2023-11-03 MED ORDER — FINASTERIDE 5 MG PO TABS: 5.0000 mg | ORAL_TABLET | Freq: Every day | ORAL | 3 refills | Status: DC

## 2023-11-04 ENCOUNTER — Ambulatory Visit: Payer: Medicare Other | Admitting: Physician Assistant

## 2023-11-04 ENCOUNTER — Other Ambulatory Visit: Payer: Medicare Other

## 2023-11-04 ENCOUNTER — Ambulatory Visit: Payer: Medicare Other | Admitting: Thoracic Surgery (Cardiothoracic Vascular Surgery)

## 2023-11-10 ENCOUNTER — Encounter: Payer: Self-pay | Admitting: Internal Medicine

## 2023-11-11 ENCOUNTER — Ambulatory Visit: Payer: Medicare Other

## 2023-11-11 ENCOUNTER — Ambulatory Visit (INDEPENDENT_AMBULATORY_CARE_PROVIDER_SITE_OTHER): Payer: Medicare Other | Admitting: Thoracic Surgery (Cardiothoracic Vascular Surgery)

## 2023-11-11 VITALS — BP 128/78 | HR 58 | Resp 18 | Ht 70.0 in | Wt 216.0 lb

## 2023-11-11 DIAGNOSIS — C3491 Malignant neoplasm of unspecified part of right bronchus or lung: Secondary | ICD-10-CM

## 2023-11-11 NOTE — Progress Notes (Signed)
[address]       [name] [address]             [phone]     HPI: Randall Sullivan returns for follow-up after recent bronchoscopy and endobronchial ultrasound.  Randall Sullivan is a 73 year old male with a history of tobacco abuse, lung cancer x 2, COPD, atrial fibrillation, hypertension, hyperlipidemia, stroke, obstructive sleep apnea, chronic hypoxic respiratory failure, and home oxygen.  40-pack-year history of smoking prior to quitting in 2017.  Recently follow-up CT showed an increase soft tissue density around his right upper lobe staple line from his previous wedge resection.  PET/CT showed hypermetabolic activity.  Was hospitalized in December for COPD exacerbation.  I saw him in January and recommended a navigational bronchoscopy and endobronchial ultrasound.  That was done on 10/24/2023.  I was not able to approach the lesion with a navigational bronchoscopy but we were able to access it with endobronchial ultrasound.  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  Unfortunately there was not sufficient tissue for molecular testing.  He did well with the procedure.  He was hoarse for a few days but otherwise has not had any issues related to it.  Past Medical History:  Diagnosis Date   A-fib [facility]) 2017   after last lung surgery patient had a history if Afib documented in hospital    AAA (abdominal aortic aneurysm) (HCC)    4.0 cm 09/17/22 [place]   Acute appendicitis 07/14/2021   Arthritis    back & legs ( knees)   Cancer (HCC)    lung   Complication of anesthesia    agitated upon waking from anesth.    Dyspnea    Dysrhythmia    A. Fib   Emphysema    Emphysema of lung (HCC)    History of kidney stones    Hyperlipidemia    Hypertension    Oxygen dependent    2L continuous   Peripheral vascular disease (HCC)    Aortic Aneurysm   Pneumonia    Pre-diabetes    Sleep apnea    No OSA, but wears 2L [place] O2 continuous including at night   Stroke [facility])  2022   Right sided weakness, decreased hearing, balance and memory loss   Tobacco abuse     Current Outpatient Medications  Medication Sig Dispense Refill   acetaminophen (TYLENOL) 325 MG tablet Take 2 tablets (650 mg total) by mouth every 6 (six) hours as needed for mild pain (or Fever >/= 101).     atorvastatin (LIPITOR) 80 MG tablet Take 80 mg by mouth daily.     cetirizine (ZYRTEC) 10 MG tablet Take 10 mg by mouth daily.     Cholecalciferol (VITAMIN D3) 125 MCG (5000 UT) CAPS Take 1 capsule (5,000 Units total) by mouth daily. 30 capsule 0   diltiazem (CARDIZEM CD) 180 MG 24 hr capsule Take 1 capsule (180 mg total) by mouth daily. 90 capsule 3   fenofibrate (TRICOR) 145 MG tablet Take 1 tablet (145 mg total) by mouth daily. 90 tablet 1   finasteride (PROSCAR) 5 MG tablet Take 1 tablet (5 mg total) by mouth daily. 30 tablet 3   fluticasone-salmeterol (ADVAIR) 100-50 MCG/ACT AEPB Inhale 1 puff into the lungs 2 (two) times daily. 1 each 3   guaiFENesin (MUCINEX) 600 MG 12 hr tablet Take by mouth 2 (two) times daily.     ipratropium-albuterol (DUONEB) 0.5-2.5 (3) MG/3ML SOLN USE 1 AMPULE IN NEBULIZER  4 TIMES DAILY AND AS NEEDED FOR SHORTNESS OF BREATH AND FOR WHEEZING 450 mL 0   meclizine (ANTIVERT) 25 MG tablet Take 1 tablet (25 mg total) by mouth 3 (three) times daily as needed for dizziness. 30 tablet 0   metoprolol succinate (TOPROL XL) 50 MG 24 hr tablet Take 1 tablet (50 mg total) by mouth daily. Take with or immediately following a meal. 30 tablet 2   Omega-3 Fatty Acids (FISH OIL ULTRA PO) Take 1,200 mg by mouth in the morning and at bedtime.     OXYGEN Inhale 2 L into the lungs continuous. Hx of lung ca, copd, and emphysema     pantoprazole (PROTONIX) 40 MG tablet Take 1 tablet (40 mg total) by mouth daily. 30 tablet 1   polyethylene glycol (MIRALAX / GLYCOLAX) 17 g packet Take 17 g by mouth daily as needed for mild constipation.     rosuvastatin (CRESTOR) 40 MG tablet Take 1 tablet  (40 mg total) by mouth daily. 90 tablet 3   tamsulosin (FLOMAX) 0.4 MG CAPS capsule Take 2 capsules (0.8 mg total) by mouth daily. 180 capsule 3   traZODone (DESYREL) 100 MG tablet Take 1 tablet (100 mg total) by mouth at bedtime. 90 tablet 1   warfarin (COUMADIN) 5 MG tablet Take 1 tablet (5 mg total) by mouth daily. (Patient taking differently: Take 2.5-5 mg by mouth See admin instructions. Take 2.5 mg by mouth Tuesday and Friday and take 5 mg on Monday, Wednesday, Thursday, Saturday and Sunday) 90 tablet 1   No current facility-administered medications for this visit.    Physical Exam BP 128/78   Pulse (!) 58   Resp 18   Ht 5\' 10"  (1.778 m)   Wt 216 lb (98 kg)   SpO2 98% Comment: 2L O2 [place]  BMI 30.70 kg/m  73 year old man no acute distress Nasal cannula oxygen in place  Diagnostic Tests: FINAL MICROSCOPIC DIAGNOSIS:  A. LYMPH NODE, RIGHT PARATRACHIAL, FINE NEEDLE ASPIRATION:  - Adenocarcinoma  - See comment   Impression: Randall Sullivan is a 73 year old male with a history of tobacco abuse, lung cancer x 2, COPD, atrial fibrillation, hypertension, hyperlipidemia, stroke, obstructive sleep apnea, chronic hypoxic respiratory failure, and home oxygen.  40-pack-year history of smoking prior to quitting in 2017.  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  He has a follow-up appointment scheduled with Dr. Arbutus Ped next week.  Will defer to him on whether to refer to radiation oncology.  No issues from bronchoscopy.  Plan: Follow-up with Dr. Arbutus Ped I will be happy to see Randall Sullivan back anytime in the future if I can be of any further assistance with his care  Loreli Slot, MD [facility] [phone]

## 2023-11-13 ENCOUNTER — Encounter: Payer: Self-pay | Admitting: Internal Medicine

## 2023-11-14 ENCOUNTER — Other Ambulatory Visit: Payer: Self-pay

## 2023-11-14 MED ORDER — PANTOPRAZOLE SODIUM 40 MG PO TBEC: 40.0000 mg | DELAYED_RELEASE_TABLET | Freq: Every day | ORAL | 1 refills | Status: DC

## 2023-11-14 NOTE — Progress Notes (Deleted)
 [facility] OFFICE PROGRESS NOTE  Anabel Halon, MD [address] [place] [address]  DIAGNOSIS: Recurrent lung cancer, with central right upper lobe pulmonary nodule along the which resection suture line, peripheral right lower lobe pulmonary nodule initially diagnosed with: 1)  stage IA (T1b, N0, M0) non-small cell lung cancer, well-differentiated adenocarcinoma presented with left upper lobeS pulmonary nodule diagnosed in September 2017. 2) stage Ia non-small cell lung cancer diagnosed in March 2020.  PRIOR THERAPY: 1) Status post left VATS, left upper lobectomy with mediastinal lymph node dissection under the care of Dr. Dorris Fetch on 08/01/2016. 2) status post right VATS with wedge resection of right upper lobe nodule and bleb resection under the care of Dr. Dorris Fetch on December 10, 2018.   3) Status post bronchoscopy with endobronchial valve replacement in the anterior apical and posterior segments of right upper lobe and superior segment of right lower lobe  CURRENT THERAPY: ***Referral to radiation   INTERVAL HISTORY: Randall Sullivan 73 y.o. male returns to the clinic today for a follow up visit. The patient was seen in the clinic by Dr. Arbutus Ped on 09/03/23.  He had a PET scan that showed recurrent bronchogenic carcinoma in the medial right upper lobe along the resection margin and a very faint 6 mm nodule in the right lower lobe that is too small to characterize via PET scan.  The patient subsequently saw Dr. Dorris Fetch from cardiothoracic surgery who arrange for bronchoscopy and biopsy on 10/24/2023 and the final pathology of the paratracheal lymph node FNA showed adenocarcinoma.  Patient is here today to discuss treatment options.  He denies any major changes in his health since he was last seen.  He denies any fever, chills, night sweats, unexplained weight loss.  He has some cough and dyspnea on exertion.  He denies any hemoptysis or chest pain.  Denies any  nausea, vomiting, diarrhea, or constipation.  Denies any headache or visual changes.  He is here today for evaluation and to discuss the next steps in his care.    MEDICAL HISTORY: Past Medical History:  Diagnosis Date   A-fib [facility]) 2017   after last lung surgery patient had a history if Afib documented in hospital    AAA (abdominal aortic aneurysm) (HCC)    4.0 cm 09/17/22 [place]   Acute appendicitis 07/14/2021   Arthritis    back & legs ( knees)   Cancer (HCC)    lung   Complication of anesthesia    agitated upon waking from anesth.    Dyspnea    Dysrhythmia    A. Fib   Emphysema    Emphysema of lung (HCC)    History of kidney stones    Hyperlipidemia    Hypertension    Oxygen dependent    2L continuous   Peripheral vascular disease (HCC)    Aortic Aneurysm   Pneumonia    Pre-diabetes    Sleep apnea    No OSA, but wears 2L [place] O2 continuous including at night   Stroke [facility]) 2022   Right sided weakness, decreased hearing, balance and memory loss   Tobacco abuse     ALLERGIES:  has no known allergies.  MEDICATIONS:  Current Outpatient Medications  Medication Sig Dispense Refill   acetaminophen (TYLENOL) 325 MG tablet Take 2 tablets (650 mg total) by mouth every 6 (six) hours as needed for mild pain (or Fever >/= 101).     atorvastatin (LIPITOR) 80 MG tablet Take 80  mg by mouth daily.     cetirizine (ZYRTEC) 10 MG tablet Take 10 mg by mouth daily.     Cholecalciferol (VITAMIN D3) 125 MCG (5000 UT) CAPS Take 1 capsule (5,000 Units total) by mouth daily. 30 capsule 0   diltiazem (CARDIZEM CD) 180 MG 24 hr capsule Take 1 capsule (180 mg total) by mouth daily. 90 capsule 3   fenofibrate (TRICOR) 145 MG tablet Take 1 tablet (145 mg total) by mouth daily. 90 tablet 1   finasteride (PROSCAR) 5 MG tablet Take 1 tablet (5 mg total) by mouth daily. 30 tablet 3   fluticasone-salmeterol (ADVAIR) 100-50 MCG/ACT AEPB Inhale 1 puff into the lungs 2 (two) times daily. 1 each 3    guaiFENesin (MUCINEX) 600 MG 12 hr tablet Take by mouth 2 (two) times daily.     ipratropium-albuterol (DUONEB) 0.5-2.5 (3) MG/3ML SOLN USE 1 AMPULE IN NEBULIZER 4 TIMES DAILY AND AS NEEDED FOR SHORTNESS OF BREATH AND FOR WHEEZING 450 mL 0   meclizine (ANTIVERT) 25 MG tablet Take 1 tablet (25 mg total) by mouth 3 (three) times daily as needed for dizziness. 30 tablet 0   metoprolol succinate (TOPROL XL) 50 MG 24 hr tablet Take 1 tablet (50 mg total) by mouth daily. Take with or immediately following a meal. 30 tablet 2   Omega-3 Fatty Acids (FISH OIL ULTRA PO) Take 1,200 mg by mouth in the morning and at bedtime.     OXYGEN Inhale 2 L into the lungs continuous. Hx of lung ca, copd, and emphysema     pantoprazole (PROTONIX) 40 MG tablet Take 1 tablet (40 mg total) by mouth daily. 30 tablet 1   polyethylene glycol (MIRALAX / GLYCOLAX) 17 g packet Take 17 g by mouth daily as needed for mild constipation.     rosuvastatin (CRESTOR) 40 MG tablet Take 1 tablet (40 mg total) by mouth daily. 90 tablet 3   tamsulosin (FLOMAX) 0.4 MG CAPS capsule Take 2 capsules (0.8 mg total) by mouth daily. 180 capsule 3   traZODone (DESYREL) 100 MG tablet Take 1 tablet (100 mg total) by mouth at bedtime. 90 tablet 1   warfarin (COUMADIN) 5 MG tablet Take 1 tablet (5 mg total) by mouth daily. (Patient taking differently: Take 2.5-5 mg by mouth See admin instructions. Take 2.5 mg by mouth Tuesday and Friday and take 5 mg on Monday, Wednesday, Thursday, Saturday and Sunday) 90 tablet 1   No current facility-administered medications for this visit.    SURGICAL HISTORY:  Past Surgical History:  Procedure Laterality Date   HERNIA REPAIR Bilateral 1999   umbilical   LAPAROSCOPIC APPENDECTOMY N/A 07/15/2021   Procedure: APPENDECTOMY LAPAROSCOPIC;  Surgeon: Lucretia Roers, MD;  Location: [facility];  Service: General;  Laterality: N/A;   VIDEO ASSISTED THORACOSCOPY Right 12/21/2018   Procedure: VIDEO ASSISTED THORACOSCOPY  AND RESECTION OF BLEBS RIGHT LOWER LOBE;  Surgeon: Loreli Slot, MD;  Location: [facility] OR;  Service: Thoracic;  Laterality: Right;   VIDEO ASSISTED THORACOSCOPY (VATS)/ LOBECTOMY Left 08/01/2016   Procedure: VIDEO ASSISTED THORACOSCOPY (VATS)/ LEFT UPPER LOBECTOMY with lymph node sampling and onQ placement;  Surgeon: Loreli Slot, MD;  Location: [facility] OR;  Service: Thoracic;  Laterality: Left;   VIDEO ASSISTED THORACOSCOPY (VATS)/WEDGE RESECTION Right 12/09/2018   Procedure: VIDEO ASSISTED THORACOSCOPY (VATS)/WEDGE RESECTION;  Surgeon: Loreli Slot, MD;  Location: [facility] OR;  Service: Thoracic;  Laterality: Right;   VIDEO BRONCHOSCOPY Bilateral 12/21/2018   Procedure: VIDEO BRONCHOSCOPY;  Surgeon: Loreli Slot, MD;  Location: [facility] OR;  Service: Thoracic;  Laterality: Bilateral;   VIDEO BRONCHOSCOPY WITH ENDOBRONCHIAL NAVIGATION N/A 07/03/2016   Procedure: VIDEO BRONCHOSCOPY WITH ENDOBRONCHIAL NAVIGATION;  Surgeon: Loreli Slot, MD;  Location: [facility] OR;  Service: Thoracic;  Laterality: N/A;   VIDEO BRONCHOSCOPY WITH ENDOBRONCHIAL NAVIGATION N/A 10/24/2023   Procedure: VIDEO BRONCHOSCOPY WITH ENDOBRONCHIAL NAVIGATION;  Surgeon: Loreli Slot, MD;  Location: [facility] OR;  Service: Thoracic;  Laterality: N/A;   VIDEO BRONCHOSCOPY WITH ENDOBRONCHIAL ULTRASOUND N/A 10/24/2023   Procedure: VIDEO BRONCHOSCOPY WITH ENDOBRONCHIAL ULTRASOUND;  Surgeon: Loreli Slot, MD;  Location: [facility] OR;  Service: Thoracic;  Laterality: N/A;   VIDEO BRONCHOSCOPY WITH INSERTION OF INTERBRONCHIAL VALVE (IBV) N/A 12/11/2018   Procedure: VIDEO BRONCHOSCOPY WITH INSERTION OF INTERBRONCHIAL VALVE (IBV);  Surgeon: Loreli Slot, MD;  Location: [facility] OR;  Service: Thoracic;  Laterality: N/A;   VIDEO BRONCHOSCOPY WITH INSERTION OF INTERBRONCHIAL VALVE (IBV) N/A 02/10/2019   Procedure: VIDEO BRONCHOSCOPY WITH REMOVAL OF INTERBRONCHIAL VALVE (IBV);  Surgeon: Loreli Slot, MD;  Location: [facility]  OR;  Service: Thoracic;  Laterality: N/A;    REVIEW OF SYSTEMS:   Review of Systems  Constitutional: Negative for appetite change, chills, fatigue, fever and unexpected weight change.  HENT:   Negative for mouth sores, nosebleeds, sore throat and trouble swallowing.   Eyes: Negative for eye problems and icterus.  Respiratory: Negative for cough, hemoptysis, shortness of breath and wheezing.   Cardiovascular: Negative for chest pain and leg swelling.  Gastrointestinal: Negative for abdominal pain, constipation, diarrhea, nausea and vomiting.  Genitourinary: Negative for bladder incontinence, difficulty urinating, dysuria, frequency and hematuria.   Musculoskeletal: Negative for back pain, gait problem, neck pain and neck stiffness.  Skin: Negative for itching and rash.  Neurological: Negative for dizziness, extremity weakness, gait problem, headaches, light-headedness and seizures.  Hematological: Negative for adenopathy. Does not bruise/bleed easily.  Psychiatric/Behavioral: Negative for confusion, depression and sleep disturbance. The patient is not nervous/anxious.     PHYSICAL EXAMINATION:  There were no vitals taken for this visit.  ECOG PERFORMANCE STATUS: {CHL ONC ECOG [id number]  Physical Exam  Constitutional: Oriented to person, place, and time and well-developed, well-nourished, and in no distress. No distress.  HENT:  Head: Normocephalic and atraumatic.  Mouth/Throat: Oropharynx is clear and moist. No oropharyngeal exudate.  Eyes: Conjunctivae are normal. Right eye exhibits no discharge. Left eye exhibits no discharge. No scleral icterus.  Neck: Normal range of motion. Neck supple.  Cardiovascular: Normal rate, regular rhythm, normal heart sounds and intact distal pulses.   Pulmonary/Chest: Effort normal and breath sounds normal. No respiratory distress. No wheezes. No rales.  Abdominal: Soft. Bowel sounds are normal. Exhibits no distension and no mass. There is no  tenderness.  Musculoskeletal: Normal range of motion. Exhibits no edema.  Lymphadenopathy:    No cervical adenopathy.  Neurological: Alert and oriented to person, place, and time. Exhibits normal muscle tone. Gait normal. Coordination normal.  Skin: Skin is warm and dry. No rash noted. Not diaphoretic. No erythema. No pallor.  Psychiatric: Mood, memory and judgment normal.  Vitals reviewed.  LABORATORY DATA: Lab Results  Component Value Date   WBC 6.7 10/21/2023   HGB 13.1 10/21/2023   HCT 42.9 10/21/2023   MCV 87.0 10/21/2023   PLT 330 10/21/2023      Chemistry      Component Value Date/Time   NA 137 10/21/2023 1310   NA 140 07/15/2023 1142   NA 136 04/09/2017  1249   K 4.5 10/21/2023 1310   K 4.6 04/09/2017 1249   CL 100 10/21/2023 1310   CO2 27 10/21/2023 1310   CO2 27 04/09/2017 1249   BUN 14 10/21/2023 1310   BUN 13 07/15/2023 1142   BUN 23.9 04/09/2017 1249   CREATININE 1.37 (H) 10/21/2023 1310   CREATININE 1.25 (H) 06/11/2022 1132   CREATININE 1.21 (H) 05/08/2020 1051   CREATININE 1.4 (H) 04/09/2017 1249      Component Value Date/Time   CALCIUM 9.4 10/21/2023 1310   CALCIUM 9.6 04/09/2017 1249   ALKPHOS 38 10/21/2023 1310   ALKPHOS 58 04/09/2017 1249   AST 26 10/21/2023 1310   AST 23 06/11/2022 1132   AST 13 04/09/2017 1249   ALT 22 10/21/2023 1310   ALT 27 06/11/2022 1132   ALT 9 04/09/2017 1249   BILITOT 1.5 (H) 10/21/2023 1310   BILITOT 0.4 03/04/2023 1515   BILITOT 0.5 06/11/2022 1132   BILITOT 1.01 04/09/2017 1249       RADIOGRAPHIC STUDIES:  US AORTA Result Date: 10/28/2023 : PROCEDURE: ULTRASOUND AORTA HISTORY: Patient is a 73 y/o M with known AAA - last measured 4 cm. Hypertension. CAD. Hyperlipidemia. Diabetes. Former tobacco use. COMPARISON: U/S aorta 09/17/2022. TECHNIQUE: Two-dimensional grayscale, color and spectral Doppler ultrasound of the abdominal aortic and iliac arteries was performed. FINDINGS: The aorta measures 3.5 x 3.7 cm  (previously 2.1 x 2.9 cm), 3.1 x 3.2 cm (previously 3.5 x 4.0 cm) and 4.7 x 4.5 cm (previously 3.0 x 3.0 cm) in its proximal, mid and distal aspects, respectively. There is mild plaque. Aneurysmal aorta. It demonstrates normal color and Doppler flow. The right common iliac artery measures 1.7 x 1.9 cm and the left common iliac artery measures 1.5 x 1.5 cm. IMPRESSION: 1. Aneurysmal abdominal aorta with the largest dimension measuring 4.7 x 4.5 cm within the distal aorta. Aneurysms have increased in size since the prior ultrasound dating back to December 2023. 2. 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 This is stable. Surveillance should be every 3 years in patients with AAA diameter 3.0-3.9 cm. Annually in men with AAA diameter 4.0-4.9 cm or women with AAA diameter 4.0-4.4 cm. Every 6 months in men with AAA diameter ?5.0 cm or women with AAA diameter ?4.5 cm. 2022 ACC/AHA Guideline for the Diagnosis and Management of Aortic Disease: A Report of the American Heart Association/American College of Cardiology Joint Committee on Clinical Practice Guidelines Thank you for allowing [place] to assist in the care of this patient. Electronically Signed   By: Lestine Box M.D.   On: 10/28/2023 10:55   CT Super D Chest Wo Contrast Result Date: 10/27/2023 CLINICAL DATA:  Right lung adenocarcinoma. Restaging. * Tracking Code: BO * EXAM: CT CHEST WITHOUT CONTRAST TECHNIQUE: Multidetector CT imaging of the chest was performed using thin slice collimation for electromagnetic bronchoscopy planning purposes, without intravenous contrast. RADIATION DOSE REDUCTION: This exam was performed according to the departmental dose-optimization program which includes automated exposure control, adjustment of the mA and/or kV according to patient size and/or use of iterative reconstruction technique. COMPARISON:  07/08/2023 chest CT.  08/19/2023 PET-CT. FINDINGS: Cardiovascular: Normal heart size. Small pericardial  effusion is stable. Three-vessel coronary atherosclerosis. Atherosclerotic nonaneurysmal thoracic aorta. Normal caliber pulmonary arteries. Mediastinum/Nodes: No significant thyroid nodules. Small fluid level in the mid to lower thoracic esophagus, unchanged. No pathologically enlarged axillary, mediastinal or hilar lymph nodes, noting limited sensitivity for the detection of hilar adenopathy on  this noncontrast study. Lungs/Pleura: No pneumothorax. No pleural effusion. Moderate centrilobular and mild paraseptal emphysema. Solid 2.6 x 1.9 cm central right upper lobe pulmonary nodule along the wedge resection suture line (series 3/image 41), previously 2.7 x 2.1 cm on 07/08/2023 chest CT using similar measurement technique, stable to minimally decreased. Solid 1.3 x 0.5 cm peripheral right lower lobe pulmonary nodule (series 3/image 92), previously 1.2 x 0.5 cm on 07/08/2023 chest CT using similar measurement technique, not substantially changed. Adjacent 0.7 cm solid peripheral right lower lobe pulmonary nodule on series 3/image 92 is stable. Status post left upper lobectomy. Minimal new patchy ground-glass opacity and consolidation in the basilar right lower lobe, favor infectious or inflammatory etiology. No new significant pulmonary nodules. Upper abdomen: No acute abnormality. Musculoskeletal: No aggressive appearing focal osseous lesions. Mild thoracic spondylosis. IMPRESSION: 1. Solid 2.6 cm central right upper lobe pulmonary nodule along the wedge resection suture line is stable to minimally decreased since 07/08/2023 chest CT. 2. Solid 1.3 cm and 0.7 cm peripheral right lower lobe pulmonary nodules are not substantially changed since 07/08/2023 chest CT. 3. No new or progressive metastatic disease in the chest. No thoracic adenopathy. 4. Minimal new patchy ground-glass opacity and consolidation in the basilar right lower lobe, favor infectious or inflammatory etiology, recommend attention on follow-up chest  CT. 5. Small fluid level in the mid to lower thoracic esophagus, unchanged, suggesting chronic mild esophageal dysmotility. 6. Three-vessel coronary atherosclerosis. 7. Aortic Atherosclerosis (ICD10-I70.0) and Emphysema (ICD10-J43.9). Electronically Signed   By: Delbert Phenix M.D.   On: 10/27/2023 19:23   DG C-ARM BRONCHOSCOPY Result Date: 10/24/2023 C-ARM BRONCHOSCOPY: Fluoroscopy was utilized by the requesting physician.  No radiographic interpretation.   DG C-Arm 1-60 Min-No Report Result Date: 10/24/2023 Fluoroscopy was utilized by the requesting physician.  No radiographic interpretation.   DG Chest 2 View Result Date: 10/21/2023 CLINICAL DATA:  Preop chest exam for bronchoscopy. EXAM: CHEST - 2 VIEW COMPARISON:  Same date chest CT.  Radiograph 09/14/2023 FINDINGS: Emphysema with chronic hyperinflation and bronchial thickening. Chain sutures within both lungs. Right upper lobe opacity on CT is not well delineated radiograph. Normal heart size with stable mediastinal contours. No evidence of acute airspace disease, large pleural effusion or pneumothorax. Normal pulmonary vasculature. No acute osseous findings. IMPRESSION: 1. No acute findings. 2. Emphysema. 3. Known right upper lobe opacity is not well delineated by radiograph. Electronically Signed   By: Narda Rutherford M.D.   On: 10/21/2023 14:28     ASSESSMENT/PLAN:  This is a very pleasant 73 year old Caucasian male with recurrent lung cancer.  He was initially diagnosed with: 1) stage IA non-small cell lung cancer status post left lower lobectomy with lymph node dissection under the care of Dr. Dorris Fetch in November 2017 and has been observation since that time.  2) stage IA non-small cell lung cancer in March 2020 and the patient underwent wedge resection of the upper lobe in March 2020.   In October 2024 a CT scan showed increasing the masslike soft tissue along the suture margin of the central right lower lobe indicative of disease  recurrence with enlarging spiculated nodule in the lateral right lower lobe.  The patient underwent bronchoscopy and biopsy under the care of Dr. Dorris Fetch in January 2024 the final pathology showed non-small cell lung cancer, adenocarcinoma.  The patient was seen with Dr. Arbutus Ped today.  Dr. Arbutus Ped had a lengthy discussion with the patient today about his current condition and recommended treatment options.  Dr. Arbutus Ped recommended ***  I will arrange for ***  Referrals ***  Education***  Prescription ***  Follow-up  ***brain MRI***  The patient was advised to call immediately if he has any concerning symptoms in the interval. The patient voices understanding of current disease status and treatment options and is in agreement with the current care plan. All questions were answered. The patient knows to call the clinic with any problems, questions or concerns. We can certainly see the patient much sooner if necessary  No orders of the defined types were placed in this encounter.    I spent {CHL ONC TIME VISIT - RUEAV:[id number]} counseling the patient face to face. The total time spent in the appointment was {CHL ONC TIME VISIT - WGNFA:[id number]}.  Sharline Lehane L Nubia Ziesmer, PA-C 11/14/23

## 2023-11-17 ENCOUNTER — Other Ambulatory Visit: Payer: Self-pay

## 2023-11-17 DIAGNOSIS — C3491 Malignant neoplasm of unspecified part of right bronchus or lung: Secondary | ICD-10-CM

## 2023-11-18 ENCOUNTER — Inpatient Hospital Stay: Payer: Medicare Other

## 2023-11-18 ENCOUNTER — Ambulatory Visit: Payer: Medicare Other | Admitting: Internal Medicine

## 2023-11-18 ENCOUNTER — Inpatient Hospital Stay: Payer: Medicare Other | Admitting: Physician Assistant

## 2023-11-25 ENCOUNTER — Ambulatory Visit: Payer: Medicare Other | Attending: Internal Medicine | Admitting: *Deleted

## 2023-11-25 DIAGNOSIS — I480 Paroxysmal atrial fibrillation: Secondary | ICD-10-CM

## 2023-11-25 DIAGNOSIS — Z5181 Encounter for therapeutic drug level monitoring: Secondary | ICD-10-CM | POA: Diagnosis not present

## 2023-11-25 LAB — POCT INR: INR: 3.3 — AB (ref 2.0–3.0)

## 2023-11-25 NOTE — Patient Instructions (Signed)
 Hold warfarin tonight then resume 1 tablet daily except 1/2 tablet on Tuesdays and Fridays Increase greens/salads  Recheck INR in 3 weeks.

## 2023-11-26 ENCOUNTER — Telehealth: Payer: Self-pay | Admitting: Radiation Oncology

## 2023-11-26 ENCOUNTER — Inpatient Hospital Stay: Payer: Medicare Other

## 2023-11-26 ENCOUNTER — Other Ambulatory Visit: Payer: Self-pay

## 2023-11-26 ENCOUNTER — Telehealth: Payer: Self-pay | Admitting: Internal Medicine

## 2023-11-26 ENCOUNTER — Inpatient Hospital Stay: Payer: Medicare Other | Attending: Internal Medicine | Admitting: Internal Medicine

## 2023-11-26 VITALS — BP 148/90 | HR 62 | Temp 98.1°F | Resp 16 | Wt 216.8 lb

## 2023-11-26 DIAGNOSIS — C3411 Malignant neoplasm of upper lobe, right bronchus or lung: Secondary | ICD-10-CM | POA: Diagnosis not present

## 2023-11-26 DIAGNOSIS — Z9981 Dependence on supplemental oxygen: Secondary | ICD-10-CM | POA: Diagnosis not present

## 2023-11-26 DIAGNOSIS — Z902 Acquired absence of lung [part of]: Secondary | ICD-10-CM | POA: Diagnosis not present

## 2023-11-26 DIAGNOSIS — Z8673 Personal history of transient ischemic attack (TIA), and cerebral infarction without residual deficits: Secondary | ICD-10-CM | POA: Diagnosis not present

## 2023-11-26 DIAGNOSIS — J449 Chronic obstructive pulmonary disease, unspecified: Secondary | ICD-10-CM | POA: Diagnosis not present

## 2023-11-26 DIAGNOSIS — C771 Secondary and unspecified malignant neoplasm of intrathoracic lymph nodes: Secondary | ICD-10-CM | POA: Diagnosis not present

## 2023-11-26 DIAGNOSIS — C3491 Malignant neoplasm of unspecified part of right bronchus or lung: Secondary | ICD-10-CM

## 2023-11-26 LAB — CMP (CANCER CENTER ONLY)
ALT: 19 U/L (ref 0–44)
AST: 20 U/L (ref 15–41)
Albumin: 4.5 g/dL (ref 3.5–5.0)
Alkaline Phosphatase: 34 U/L — ABNORMAL LOW (ref 38–126)
Anion gap: 4 — ABNORMAL LOW (ref 5–15)
BUN: 18 mg/dL (ref 8–23)
CO2: 34 mmol/L — ABNORMAL HIGH (ref 22–32)
Calcium: 9.4 mg/dL (ref 8.9–10.3)
Chloride: 101 mmol/L (ref 98–111)
Creatinine: 1.1 mg/dL (ref 0.61–1.24)
GFR, Estimated: >60 mL/min (ref >60)
Glucose, Bld: 85 mg/dL (ref 70–99)
Potassium: 4.4 mmol/L (ref 3.5–5.1)
Sodium: 139 mmol/L (ref 135–145)
Total Bilirubin: 0.7 mg/dL (ref 0.0–1.2)
Total Protein: 7.2 g/dL (ref 6.5–8.1)

## 2023-11-26 LAB — CBC WITH DIFFERENTIAL (CANCER CENTER ONLY)
Abs Immature Granulocytes: 0.02 10*3/uL (ref 0.00–0.07)
Basophils Absolute: 0 10*3/uL (ref 0.0–0.1)
Basophils Relative: 0 %
Eosinophils Absolute: 0.1 10*3/uL (ref 0.0–0.5)
Eosinophils Relative: 1 %
HCT: 44.3 % (ref 39.0–52.0)
Hemoglobin: 13.6 g/dL (ref 13.0–17.0)
Immature Granulocytes: 0 %
Lymphocytes Relative: 16 %
Lymphs Abs: 1.1 10*3/uL (ref 0.7–4.0)
MCH: 26.9 pg (ref 26.0–34.0)
MCHC: 30.7 g/dL (ref 30.0–36.0)
MCV: 87.5 fL (ref 80.0–100.0)
Monocytes Absolute: 0.8 10*3/uL (ref 0.1–1.0)
Monocytes Relative: 11 %
Neutro Abs: 4.9 10*3/uL (ref 1.7–7.7)
Neutrophils Relative %: 72 %
Platelet Count: 197 10*3/uL (ref 150–400)
RBC: 5.06 MIL/uL (ref 4.22–5.81)
RDW: 15 % (ref 11.5–15.5)
WBC Count: 6.9 10*3/uL (ref 4.0–10.5)
nRBC: 0 % (ref 0.0–0.2)

## 2023-11-26 LAB — MISCELLANEOUS TEST

## 2023-11-26 NOTE — Progress Notes (Signed)
 Referral faxed to [facility] at [facility] radiation oncology at [phone] at this time.

## 2023-11-26 NOTE — Progress Notes (Signed)
 I met with the pt face to face prior to him seeing Dr Arbutus Ped IOT review the financial assistance paperwork for his [facility] blood test. Paperwork reviewed with pt and his wife, [name], and dtr, Angie. Forms signed by pt and faxed to [facility] Client Services

## 2023-11-26 NOTE — Progress Notes (Signed)
 [facility] [phone]   [phone]  OFFICE PROGRESS NOTE  Anabel Halon, MD [address] [place] [address]  DIAGNOSIS:  1) Stage IA (T1b, N0, M0) non-small cell lung cancer, well-differentiated adenocarcinoma presented with left upper lobeS pulmonary nodule diagnosed in September 2017. 2) stage Ia non-small cell lung cancer diagnosed in March 2020. 3) recurrent non-small cell lung cancer, adenocarcinoma with biopsy-proven from the right paratracheal lymph node on January 2025 and presented with recurrent malignancy in the right upper lobe in addition to mediastinal lymphadenopathy.   PRIOR THERAPY:  1) Status post left VATS, left upper lobectomy with mediastinal lymph node dissection under the care of Dr. Dorris Fetch on 08/01/2016. 2) status post right VATS with wedge resection of right upper lobe nodule and bleb resection under the care of Dr. Dorris Fetch on December 10, 2018.   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  INTERVAL HISTORY: Randall Sullivan 73 y.o. male returns to the clinic today for follow-up visit accompanied by his wife and daughter.Discussed the use of AI scribe software for clinical note transcription with the patient, who gave verbal consent to proceed.  History of Present Illness   Randall Sullivan is a 73 year old male with recurrent non-small cell lung cancer who presents for evaluation of his condition. He is accompanied by his wife and daughter.  He has a history of stage 1A non-small cell lung cancer, adenocarcinoma, initially diagnosed following a CT scan that showed a lymph node and density on the right lung. A biopsy of the right paratracheal lymph node confirmed non-small cell lung cancer adenocarcinoma. He is currently presenting with recurrent non-small  cell lung cancer, now classified as stage 3 due to involvement of the mediastinal lymph node.  He experiences significant shortness of breath, which he attributes to his COPD and previous lung surgery. He is on home oxygen therapy to manage his breathing difficulties. No recent weight loss, nausea, vomiting, or diarrhea.  He has a history of a stroke, which has resulted in symptoms such as fatigue, weakness, and changes in vision. He also reports experiencing hoarseness, which was present before a bronchoscopy was performed.       MEDICAL HISTORY: Past Medical History:  Diagnosis Date   A-fib [facility]) 2017   after last lung surgery patient had a history if Afib documented in hospital    AAA (abdominal aortic aneurysm) (HCC)    4.0 cm 09/17/22 [place]   Acute appendicitis 07/14/2021   Arthritis    back & legs ( knees)   Cancer (HCC)    lung   Complication of anesthesia    agitated upon waking from anesth.    Dyspnea    Dysrhythmia    A. Fib   Emphysema    Emphysema of lung (HCC)    History of kidney stones    Hyperlipidemia    Hypertension    Oxygen dependent    2L continuous   Peripheral vascular disease (HCC)    Aortic Aneurysm   Pneumonia    Pre-diabetes    Sleep apnea    No OSA, but wears 2L [place] O2 continuous including at night   Stroke [facility]) 2022   Right sided weakness, decreased hearing, balance and memory loss   Tobacco abuse     ALLERGIES:  has  no known allergies.  MEDICATIONS:  Current Outpatient Medications  Medication Sig Dispense Refill   acetaminophen (TYLENOL) 325 MG tablet Take 2 tablets (650 mg total) by mouth every 6 (six) hours as needed for mild pain (or Fever >/= 101).     atorvastatin (LIPITOR) 80 MG tablet Take 80 mg by mouth daily.     cetirizine (ZYRTEC) 10 MG tablet Take 10 mg by mouth daily.     Cholecalciferol (VITAMIN D3) 125 MCG (5000 UT) CAPS Take 1 capsule (5,000 Units total) by mouth daily. 30 capsule 0   diltiazem (CARDIZEM CD) 180 MG 24 hr  capsule Take 1 capsule (180 mg total) by mouth daily. 90 capsule 3   fenofibrate (TRICOR) 145 MG tablet Take 1 tablet (145 mg total) by mouth daily. 90 tablet 1   finasteride (PROSCAR) 5 MG tablet Take 1 tablet (5 mg total) by mouth daily. 30 tablet 3   fluticasone-salmeterol (ADVAIR) 100-50 MCG/ACT AEPB Inhale 1 puff into the lungs 2 (two) times daily. 1 each 3   guaiFENesin (MUCINEX) 600 MG 12 hr tablet Take by mouth 2 (two) times daily.     ipratropium-albuterol (DUONEB) 0.5-2.5 (3) MG/3ML SOLN USE 1 AMPULE IN NEBULIZER 4 TIMES DAILY AND AS NEEDED FOR SHORTNESS OF BREATH AND FOR WHEEZING 450 mL 0   meclizine (ANTIVERT) 25 MG tablet Take 1 tablet (25 mg total) by mouth 3 (three) times daily as needed for dizziness. 30 tablet 0   metoprolol succinate (TOPROL XL) 50 MG 24 hr tablet Take 1 tablet (50 mg total) by mouth daily. Take with or immediately following a meal. 30 tablet 2   Omega-3 Fatty Acids (FISH OIL ULTRA PO) Take 1,200 mg by mouth in the morning and at bedtime.     OXYGEN Inhale 2 L into the lungs continuous. Hx of lung ca, copd, and emphysema     pantoprazole (PROTONIX) 40 MG tablet Take 1 tablet (40 mg total) by mouth daily. 30 tablet 1   polyethylene glycol (MIRALAX / GLYCOLAX) 17 g packet Take 17 g by mouth daily as needed for mild constipation.     rosuvastatin (CRESTOR) 40 MG tablet Take 1 tablet (40 mg total) by mouth daily. 90 tablet 3   tamsulosin (FLOMAX) 0.4 MG CAPS capsule Take 2 capsules (0.8 mg total) by mouth daily. 180 capsule 3   warfarin (COUMADIN) 5 MG tablet Take 1 tablet (5 mg total) by mouth daily. (Patient taking differently: Take 2.5-5 mg by mouth See admin instructions. Take 2.5 mg by mouth Tuesday and Friday and take 5 mg on Monday, Wednesday, Thursday, Saturday and Sunday) 90 tablet 1   No current facility-administered medications for this visit.    SURGICAL HISTORY:  Past Surgical History:  Procedure Laterality Date   HERNIA REPAIR Bilateral 1999    umbilical   LAPAROSCOPIC APPENDECTOMY N/A 07/15/2021   Procedure: APPENDECTOMY LAPAROSCOPIC;  Surgeon: Lucretia Roers, MD;  Location: [facility];  Service: General;  Laterality: N/A;   VIDEO ASSISTED THORACOSCOPY Right 12/21/2018   Procedure: VIDEO ASSISTED THORACOSCOPY AND RESECTION OF BLEBS RIGHT LOWER LOBE;  Surgeon: Loreli Slot, MD;  Location: [facility] OR;  Service: Thoracic;  Laterality: Right;   VIDEO ASSISTED THORACOSCOPY (VATS)/ LOBECTOMY Left 08/01/2016   Procedure: VIDEO ASSISTED THORACOSCOPY (VATS)/ LEFT UPPER LOBECTOMY with lymph node sampling and onQ placement;  Surgeon: Loreli Slot, MD;  Location: [facility] OR;  Service: Thoracic;  Laterality: Left;   VIDEO ASSISTED THORACOSCOPY (VATS)/WEDGE RESECTION Right 12/09/2018   Procedure:  VIDEO ASSISTED THORACOSCOPY (VATS)/WEDGE RESECTION;  Surgeon: Loreli Slot, MD;  Location: [facility] OR;  Service: Thoracic;  Laterality: Right;   VIDEO BRONCHOSCOPY Bilateral 12/21/2018   Procedure: VIDEO BRONCHOSCOPY;  Surgeon: Loreli Slot, MD;  Location: [facility] OR;  Service: Thoracic;  Laterality: Bilateral;   VIDEO BRONCHOSCOPY WITH ENDOBRONCHIAL NAVIGATION N/A 07/03/2016   Procedure: VIDEO BRONCHOSCOPY WITH ENDOBRONCHIAL NAVIGATION;  Surgeon: Loreli Slot, MD;  Location: [facility] OR;  Service: Thoracic;  Laterality: N/A;   VIDEO BRONCHOSCOPY WITH ENDOBRONCHIAL NAVIGATION N/A 10/24/2023   Procedure: VIDEO BRONCHOSCOPY WITH ENDOBRONCHIAL NAVIGATION;  Surgeon: Loreli Slot, MD;  Location: [facility] OR;  Service: Thoracic;  Laterality: N/A;   VIDEO BRONCHOSCOPY WITH ENDOBRONCHIAL ULTRASOUND N/A 10/24/2023   Procedure: VIDEO BRONCHOSCOPY WITH ENDOBRONCHIAL ULTRASOUND;  Surgeon: Loreli Slot, MD;  Location: [facility] OR;  Service: Thoracic;  Laterality: N/A;   VIDEO BRONCHOSCOPY WITH INSERTION OF INTERBRONCHIAL VALVE (IBV) N/A 12/11/2018   Procedure: VIDEO BRONCHOSCOPY WITH INSERTION OF INTERBRONCHIAL VALVE (IBV);  Surgeon: Loreli Slot, MD;  Location: [facility] OR;  Service: Thoracic;  Laterality: N/A;   VIDEO BRONCHOSCOPY WITH INSERTION OF INTERBRONCHIAL VALVE (IBV) N/A 02/10/2019   Procedure: VIDEO BRONCHOSCOPY WITH REMOVAL OF INTERBRONCHIAL VALVE (IBV);  Surgeon: Loreli Slot, MD;  Location: [facility] OR;  Service: Thoracic;  Laterality: N/A;    REVIEW OF SYSTEMS:  Constitutional: positive for fatigue Eyes: negative Ears, nose, mouth, throat, and face: negative Respiratory: positive for dyspnea on exertion Cardiovascular: negative Gastrointestinal: negative Genitourinary:negative Integument/breast: negative Hematologic/lymphatic: negative Musculoskeletal:negative Neurological: negative Behavioral/Psych: negative Endocrine: negative Allergic/Immunologic: negative   PHYSICAL EXAMINATION: General appearance: alert, cooperative, fatigued, and no distress Head: Normocephalic, without obvious abnormality, atraumatic Neck: no adenopathy, no JVD, supple, symmetrical, trachea midline, and thyroid not enlarged, symmetric, no tenderness/mass/nodules Lymph nodes: Cervical, supraclavicular, and axillary nodes normal. Resp: clear to auscultation bilaterally Back: symmetric, no curvature. ROM normal. No CVA tenderness. Cardio: regular rate and rhythm, S1, S2 normal, no murmur, click, rub or gallop GI: soft, non-tender; bowel sounds normal; no masses,  no organomegaly Extremities: extremities normal, atraumatic, no cyanosis or edema Neurologic: Alert and oriented X 3, normal strength and tone. Normal symmetric reflexes. Normal coordination and gait  ECOG PERFORMANCE STATUS: 1 - Symptomatic but completely ambulatory  Blood pressure (!) 148/90, pulse 62, temperature 98.1 F (36.7 C), temperature source Temporal, resp. rate 16, weight 216 lb 12.8 oz (98.3 kg), SpO2 100%.  LABORATORY DATA: Lab Results  Component Value Date   WBC 6.9 11/26/2023   HGB 13.6 11/26/2023   HCT 44.3 11/26/2023   MCV 87.5 11/26/2023   PLT 197  11/26/2023      Chemistry      Component Value Date/Time   NA 139 11/26/2023 1040   NA 140 07/15/2023 1142   NA 136 04/09/2017 1249   K 4.4 11/26/2023 1040   K 4.6 04/09/2017 1249   CL 101 11/26/2023 1040   CO2 34 (H) 11/26/2023 1040   CO2 27 04/09/2017 1249   BUN 18 11/26/2023 1040   BUN 13 07/15/2023 1142   BUN 23.9 04/09/2017 1249   CREATININE 1.10 11/26/2023 1040   CREATININE 1.21 (H) 05/08/2020 1051   CREATININE 1.4 (H) 04/09/2017 1249      Component Value Date/Time   CALCIUM 9.4 11/26/2023 1040   CALCIUM 9.6 04/09/2017 1249   ALKPHOS 34 (L) 11/26/2023 1040   ALKPHOS 58 04/09/2017 1249   AST 20 11/26/2023 1040   AST 13 04/09/2017 1249   ALT 19 11/26/2023 1040  ALT 9 04/09/2017 1249   BILITOT 0.7 11/26/2023 1040   BILITOT 1.01 04/09/2017 1249       RADIOGRAPHIC STUDIES: US AORTA Result Date: 10/28/2023 : PROCEDURE: ULTRASOUND AORTA HISTORY: Patient is a 73 y/o M with known AAA - last measured 4 cm. Hypertension. CAD. Hyperlipidemia. Diabetes. Former tobacco use. COMPARISON: U/S aorta 09/17/2022. TECHNIQUE: Two-dimensional grayscale, color and spectral Doppler ultrasound of the abdominal aortic and iliac arteries was performed. FINDINGS: The aorta measures 3.5 x 3.7 cm (previously 2.1 x 2.9 cm), 3.1 x 3.2 cm (previously 3.5 x 4.0 cm) and 4.7 x 4.5 cm (previously 3.0 x 3.0 cm) in its proximal, mid and distal aspects, respectively. There is mild plaque. Aneurysmal aorta. It demonstrates normal color and Doppler flow. The right common iliac artery measures 1.7 x 1.9 cm and the left common iliac artery measures 1.5 x 1.5 cm. IMPRESSION: 1. Aneurysmal abdominal aorta with the largest dimension measuring 4.7 x 4.5 cm within the distal aorta. Aneurysms have increased in size since the prior ultrasound dating back to December 2023. 2. 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 This is stable. Surveillance should be every 3 years in patients with AAA  diameter 3.0-3.9 cm. Annually in men with AAA diameter 4.0-4.9 cm or women with AAA diameter 4.0-4.4 cm. Every 6 months in men with AAA diameter ?5.0 cm or women with AAA diameter ?4.5 cm. 2022 ACC/AHA Guideline for the Diagnosis and Management of Aortic Disease: A Report of the American Heart Association/American College of Cardiology Joint Committee on Clinical Practice Guidelines Thank you for allowing [place] to assist in the care of this patient. Electronically Signed   By: Lestine Box M.D.   On: 10/28/2023 10:55    ASSESSMENT AND PLAN:  This is a very pleasant 73 years old white male with a stage IA non-small cell lung cancer status post left lower lobectomy with lymph node dissection under the care of Dr. Dorris Fetch in November 2017 and has been observation since that time. He was also diagnosed with stage IA non-small cell lung cancer in March 2020 and the patient underwent wedge resection of the upper lobe in March 2020.  The patient is currently on observation. He was found on recent CT scan of the chest performed on July 08, 2023 to have increasing mass like soft tissue along the suture margin in the central right upper lobe indicative of disease recurrence with a slightly enlarging spiculated nodule in the lateral right lower lobe. A PET scan on August 19, 2023 showed recurrent bronchogenic carcinoma in the medial right upper lobe along the resection margin with faint 0.6 cm nodule in the superior segment of the right lower lobe too small for PET resolution.  The patient underwent bronchoscopy under the care of Dr. Dorris Fetch and biopsy of the right paratracheal lymph node was consistent with recurrent non-small cell lung cancer, adenocarcinoma. I had a lengthy discussion with the patient and his family today about his current condition and treatment options.    Recurrent Stage III Non-Small Cell Lung Cancer (NSCLC) Stage III NSCLC, adenocarcinoma, with right paratracheal lymph node  involvement. Surgical resection not feasible. Treatment includes weekly mild chemotherapy to enhance radiation therapy over six and a half weeks. Potential side effects: dysphagia, cough, mild chemotherapy effects. Radiation may initially worsen dyspnea due to lung volume reduction. Patient prefers radiation therapy at [facility]. Molecular test results will not alter treatment now but may be after the induction phase. Mild chemotherapy as a  radiosensitizing aims to avoid severe side effects like alopecia, nausea, and vomiting. - Refer to Dr. Carles Collet for chemotherapy at [facility] - Refer to radiation oncologist  - Order MRI of the brain at [facility]  Chronic Obstructive Pulmonary Disease (COPD) COPD on home oxygen therapy, contributing to dyspnea and limiting surgical options for lung cancer. - Continue home oxygen therapy  Stroke Residual symptoms include vision changes, fatigue, and weakness. - Monitor for any new or worsening neurological symptoms  General Health Maintenance No recent weight loss, nausea, vomiting, or diarrhea. Vision changes likely age-related. - Monitor general health and report any new symptoms  Follow-up - Follow up with oncology team as needed - Follow up with radiation oncologist as scheduled.   The patient was advised to call immediately if he has any other concerning symptoms. The patient voices understanding of current disease status and treatment options and is in agreement with the current care plan.  All questions were answered. The patient knows to call the clinic with any problems, questions or concerns. We can certainly see the patient much sooner if necessary.  The total time spent in the appointment was 40 minutes.  Disclaimer: This note was dictated with voice recognition software. Similar sounding words can inadvertently be transcribed and may not be corrected upon review.

## 2023-11-26 NOTE — Telephone Encounter (Signed)
 Called patient to schedule patient for a consultation w. Dr. Roselind Messier. Spoke with patient's wife, she requested for patient to be seen at [facility]. Advised Dr. Asa Lente office to send referral to [facility]. Closing referral at this time.

## 2023-11-26 NOTE — Telephone Encounter (Signed)
 Copied from CRM [id number]. Topic: Medical Record Request - Other >> Nov 26, 2023  4:01 PM Ivette P wrote: Reason for CRM: [name] called in because pt needs certifications filled out for work so he can get radiation and chemo treatment, [name] would like a call back to see what is needed. Pt [phone]

## 2023-11-27 NOTE — Telephone Encounter (Signed)
 Spoke with lisa states forms are going to be faxed to the office.

## 2023-11-27 NOTE — Progress Notes (Signed)
 I spoke to the pt's wife, [name], this morning. I informed her that I had faxed over a referral to radiation oncology at [facility] for his radiation treatment. [name] states they're supposed to see a doctor at [facility] next week. I confirmed with her that per the pt's EPIC records that he does have a consult with Dr Ellin Saba set up for 3/5, however if radiation is recommended as part of his treatment, [facility] does not offer radiation and he would need to come to [facility] if he wants to stay in the [facility]. [name] asked if he could get his chemotherapy at [facility] as well. I told her that he likely could, but to keep the appt with Dr Ellin Saba and see what he recommends. If the pt wants to go to [facility] for all of his treatment, Dr Ellin Saba can place that referral for him. [name] verbalized understanding and knows she can continue to reach out to me as needed.

## 2023-12-02 ENCOUNTER — Ambulatory Visit (HOSPITAL_COMMUNITY)
Admission: RE | Admit: 2023-12-02 | Discharge: 2023-12-02 | Disposition: A | Discharge status: Home / Self Care | Payer: Medicare Other | Source: Ambulatory Visit | Attending: Internal Medicine | Admitting: Internal Medicine

## 2023-12-02 ENCOUNTER — Other Ambulatory Visit: Payer: Self-pay | Admitting: Internal Medicine

## 2023-12-02 ENCOUNTER — Telehealth: Payer: Self-pay | Admitting: Internal Medicine

## 2023-12-02 DIAGNOSIS — R9089 Other abnormal findings on diagnostic imaging of central nervous system: Secondary | ICD-10-CM | POA: Diagnosis not present

## 2023-12-02 DIAGNOSIS — C3491 Malignant neoplasm of unspecified part of right bronchus or lung: Secondary | ICD-10-CM | POA: Insufficient documentation

## 2023-12-02 DIAGNOSIS — C3490 Malignant neoplasm of unspecified part of unspecified bronchus or lung: Secondary | ICD-10-CM | POA: Diagnosis not present

## 2023-12-02 DIAGNOSIS — G9389 Other specified disorders of brain: Secondary | ICD-10-CM | POA: Diagnosis not present

## 2023-12-02 MED ORDER — GADOBUTROL 1 MMOL/ML IV SOLN
10.0000 mL | Freq: Once | INTRAVENOUS | Status: AC | PRN
  Administered 2023-12-02: 10 mL via INTRAVENOUS

## 2023-12-02 NOTE — Telephone Encounter (Signed)
Disability  Noted  Copied Sleeved  Original in PCP box Copy front desk folder

## 2023-12-02 NOTE — Progress Notes (Signed)
 [facility] [address], [place] [address]   Clinic Day:  12/03/2023  Referring physician: Anabel Halon, MD  Patient Care Team: Anabel Halon, MD as PCP - General (Internal Medicine) Randall Massed, MD as Medical Oncologist (Medical Oncology) Therese Sarah, RN as Oncology Nurse Navigator (Medical Oncology)   ASSESSMENT & PLAN:   Assessment:  1.  Recurrent adenocarcinoma of the lung: - Stage Ia (T1b N0 M0) adenocarcinoma of the left upper lobe, s/p left VATS, left upper lobectomy with mediastinal lymph node dissection by Dr. Dorris Fetch on 08/01/2016 - Stage Ia moderately differentiated adenocarcinoma, 0.7 cm of the right upper lobe, s/p right VATS with wedge resection of right upper lobe nodule and bleb resection by Dr. Dorris Fetch on 12/09/2018 - PET scan (08/19/2023): Dysregulation artifact degrades image quality.  Recurrent bronchogenic carcinoma in the medial right upper lobe along the resection margin with no evidence of distant metastatic disease.  Faint 6 mm nodule in the superior segment right lower lobe too small for PET resolution.  Focal hypermetabolism in the central prostate. - CT super D chest (10/21/2023): Solid 2.6 cm central right upper lobe pulmonary nodule along the wedge resection suture line.  Solid 1.3 cm and 0.7 cm peripheral right lower lobe lung nodules. - MRI brain (12/02/2023): Without IV contrast with no strong evidence of cerebral metastatic disease. - Right paratracheal lymph node FNA (10/24/2023): Adenocarcinoma - Foundation 1 CDX: Insufficient tissue - PD-L1 22 C3 (TPS): 5%  2.  Social/family history: - He lives at home with his wife Randall Sullivan.  He had a stroke in 2022 which has caused short-term memory loss, loss of right-sided peripheral vision and balance problems.  He currently walks with a cane and sometimes walker.  He is also on continuous oxygen since hospitalization in December 2024.  Most of the day he spends time  sitting in the chair.  He can wash dishes but does require some help with some of the ADLs.  Quit smoking in 2017. - Mother and sister had ovarian cancer.  Half brother had lung cancer.   Plan:  1.  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 - We also discussed biopsy results.  He reports left arm pain for the past few months.  Will keep an eye on it. - We discussed treatment plan with combination chemoradiation therapy with weekly carboplatin and paclitaxel as radiosensitizer.  He has an appointment to see Dr. Langston Masker tomorrow.  This will be followed by CT scan within 4 weeks after completion.  If he does get at least a partial response or stable disease, he will be offered consolidation with immunotherapy for a year.  We will also repeat a Guardant360 liquid biopsy at that time. - We talked about the chemo regimen and side effects in detail.  Literature was given. - Recommend port placement. - I will order repeat imaging with a PET scan if Dr. Langston Masker requires it for radiation planning.   Orders Placed This Encounter  Procedures   IR IMAGING GUIDED PORT INSERTION    Standing Status:   Future    Expected Date:   12/10/2023    Expiration Date:   12/02/2024    Reason for Exam (SYMPTOM  OR DIAGNOSIS REQUIRED):   chemotherapy administration    Preferred Imaging Location?:   [facility]    Release to patient:   Immediate  Magnesium    Standing Status:   Future    Expected Date:   12/17/2023    Expiration Date:   12/16/2024   CBC with Differential    Standing Status:   Future    Expected Date:   12/17/2023    Expiration Date:   12/16/2024   Comprehensive metabolic panel    Standing Status:   Future    Expected Date:   12/17/2023    Expiration Date:   12/16/2024   Magnesium    Standing Status:   Future    Expected Date:   12/24/2023    Expiration Date:   12/23/2024   CBC with Differential     Standing Status:   Future    Expected Date:   12/24/2023    Expiration Date:   12/23/2024   Comprehensive metabolic panel    Standing Status:   Future    Expected Date:   12/24/2023    Expiration Date:   12/23/2024   Magnesium    Standing Status:   Future    Expected Date:   12/31/2023    Expiration Date:   12/30/2024   CBC with Differential    Standing Status:   Future    Expected Date:   12/31/2023    Expiration Date:   12/30/2024   Comprehensive metabolic panel    Standing Status:   Future    Expected Date:   12/31/2023    Expiration Date:   12/30/2024   Magnesium    Standing Status:   Future    Expected Date:   01/07/2024    Expiration Date:   01/06/2025   CBC with Differential    Standing Status:   Future    Expected Date:   01/07/2024    Expiration Date:   01/06/2025   Comprehensive metabolic panel    Standing Status:   Future    Expected Date:   01/07/2024    Expiration Date:   01/06/2025   Magnesium    Standing Status:   Future    Expected Date:   01/14/2024    Expiration Date:   01/13/2025   CBC with Differential    Standing Status:   Future    Expected Date:   01/14/2024    Expiration Date:   01/13/2025   Comprehensive metabolic panel    Standing Status:   Future    Expected Date:   01/14/2024    Expiration Date:   01/13/2025   Magnesium    Standing Status:   Future    Expected Date:   01/21/2024    Expiration Date:   01/20/2025   CBC with Differential    Standing Status:   Future    Expected Date:   01/21/2024    Expiration Date:   01/20/2025   Comprehensive metabolic panel    Standing Status:   Future    Expected Date:   01/21/2024    Expiration Date:   01/20/2025   Ambulatory referral to Social Work    Referral Priority:   Routine    Referral Type:   Consultation    Referral Reason:   Specialty Services Required    Number of Visits Requested:   1   Ambulatory Referral to [facility] Nutrition    Referral Priority:   Routine    Referral Type:   Consultation    Referral Reason:   Specialty  Services Required    Number of Visits Requested:   1  [name],acting as a Neurosurgeon for Randall Massed, MD.,have documented all relevant documentation on the behalf of Randall Massed, MD,as directed by  Randall Massed, MD while in the presence of Randall Massed, MD.   I, Randall Massed MD, have reviewed the above documentation for accuracy and completeness, and I agree with the above.   Randall Massed, MD   3/5/20253:58 PM  CHIEF COMPLAINT/PURPOSE OF CONSULT:   Diagnosis: Recurrent stage III adenocarcinoma   Cancer Staging  Primary lung adenocarcinoma (HCC) Staging form: Lung, AJCC 8th Edition - Clinical: Stage IA2 (cT1b, cN0, cM0) - Signed by [name], MD on 12/04/2020  Recurrent non-small cell lung cancer (NSCLC) (HCC) Staging form: Lung, AJCC V9 - Clinical stage from 12/03/2023: Stage IA3 (rcT1c, cN0, cM0) - Unsigned    Prior Therapy: Left upper lobectomy (2017) and right upper lobectomy (2020)  Current Therapy: Concurrent chemoradiation therapy   HISTORY OF PRESENT ILLNESS:   Oncology History  Primary lung adenocarcinoma (HCC)  10/15/2016 Initial Diagnosis   Primary lung adenocarcinoma (HCC)   12/04/2020 Cancer Staging   Staging form: Lung, AJCC 8th Edition - Clinical: Stage IA2 (cT1b, cN0, cM0) - Signed by [name], MD on 12/04/2020   Recurrent non-small cell lung cancer (NSCLC) (HCC)  12/03/2023 Initial Diagnosis   Recurrent non-small cell lung cancer (NSCLC) (HCC)   12/17/2023 -  Chemotherapy   Patient is on Treatment Plan : LUNG Carboplatin + Paclitaxel + XRT q7d         Dreshaun is a 73 y.o. male presenting to clinic today for evaluation of recurrent adenocarcinoma of right lung, non small cell type at the request of Dr. Arbutus Ped.  Jerrad was first diagnosed in 2017 with Stage IA (T1b, N0, M0) non-small cell lung cancer, well-differentiated adenocarcinoma presented with left upper lobe pulmonary nodule. He then  underwent left VATS and left upper lobectomy with mediastinal lymph node dissection under Dr. Dorris Fetch on 08/01/2016. Candler has since been under surveillance for left lung adenocarcinoma.  He was diagnosed with stage Ia non-small cell lung cancer in March 2020 and underwent right VATS with wedge resection of right upper lobe nodule and bleb resection under Dr. Dorris Fetch on 12/10/2018. He was under observation until a CT scan on 07/08/23 found an increasing mass like soft tissue along the suture margin in the central right upper lobe. PET on 08/19/23 found: recurrent bronchogenic carcinoma in the medial right upper lobe along the resection margin with faint 0.6 cm nodule in the superior segment of the right lower lobe too small for PET resolution.   Selah then had a bronchoscopy on 10/24/23 with right paratracheal lymph node biopsy and endobronchial valve replacement in the anterior apical, posterior segments of right upper lobe, and superior segment of right lower lobe. Pathology of lymph node biopsy showed: adenocarcinoma. Molecular pathology showed a PDL1 score of 5%.   His cancer is now considered Stage III NSCLC adenocarcinoma due to right paratracheal lymph node involvement. Tin had discussion of treatment with Dr. Arbutus Ped on 11/26/23 that consisted of weekly mild chemotherapy with adjunct radiation at [facility] with their side effects.   MRI brain done on 12/02/23 showed: No strong evidence of cerebral metastatic disease on this noncontrast exam. 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 Encephalomalacia and mild hemosiderin. No acute intracranial abnormality identified.  Haruto has a history of stroke, with symptoms of fatigue, weakness, vision changes, and hoarseness. He also has pertinent history of COPD and requires at home oxygen.  Today, he  states that he is doing well overall. His appetite level is at 100%. His energy level is at 50%.  PAST MEDICAL HISTORY:   Past Medical  History: Past Medical History:  Diagnosis Date   A-fib (HCC) 2017   after last lung surgery patient had a history if Afib documented in hospital    AAA (abdominal aortic aneurysm) (HCC)    4.0 cm 09/17/22 [place]   Acute appendicitis 07/14/2021   Arthritis    back & legs ( knees)   Cancer (HCC)    lung   Complication of anesthesia    agitated upon waking from anesth.    Dyspnea    Dysrhythmia    A. Fib   Emphysema    Emphysema of lung (HCC)    History of kidney stones    Hyperlipidemia    Hypertension    Oxygen dependent    2L continuous   Peripheral vascular disease (HCC)    Aortic Aneurysm   Pneumonia    Pre-diabetes    Sleep apnea    No OSA, but wears 2L [place] O2 continuous including at night   [facility]roke [facility] 2022   Right sided weakness, decreased hearing, balance and memory loss   Tobacco abuse     Surgical History: Past Surgical History:  Procedure Laterality Date   HERNIA REPAIR Bilateral 1999   umbilical   LAPAROSCOPIC APPENDECTOMY N/A 07/15/2021   Procedure: APPENDECTOMY LAPAROSCOPIC;  Surgeon: Lucretia Roers, MD;  Location: [facility];  Service: General;  Laterality: N/A;   VIDEO ASSISTED THORACOSCOPY Right 12/21/2018   Procedure: VIDEO ASSISTED THORACOSCOPY AND RESECTION OF BLEBS RIGHT LOWER LOBE;  Surgeon: Loreli Slot, MD;  Location: [facility] OR;  Service: Thoracic;  Laterality: Right;   VIDEO ASSISTED THORACOSCOPY (VATS)/ LOBECTOMY Left 08/01/2016   Procedure: VIDEO ASSISTED THORACOSCOPY (VATS)/ LEFT UPPER LOBECTOMY with lymph node sampling and onQ placement;  Surgeon: Loreli Slot, MD;  Location: [facility] OR;  Service: Thoracic;  Laterality: Left;   VIDEO ASSISTED THORACOSCOPY (VATS)/WEDGE RESECTION Right 12/09/2018   Procedure: VIDEO ASSISTED THORACOSCOPY (VATS)/WEDGE RESECTION;  Surgeon: Loreli Slot, MD;  Location: [facility];  Service: Thoracic;  Laterality: Right;   VIDEO BRONCHOSCOPY Bilateral 12/21/2018   Procedure: VIDEO BRONCHOSCOPY;  Surgeon:  Loreli Slot, MD;  Location: [facility];  Service: Thoracic;  Laterality: Bilateral;   VIDEO BRONCHOSCOPY WITH ENDOBRONCHIAL NAVIGATION N/A 07/03/2016   Procedure: VIDEO BRONCHOSCOPY WITH ENDOBRONCHIAL NAVIGATION;  Surgeon: Loreli Slot, MD;  Location: [facility];  Service: Thoracic;  Laterality: N/A;   VIDEO BRONCHOSCOPY WITH ENDOBRONCHIAL NAVIGATION N/A 10/24/2023   Procedure: VIDEO BRONCHOSCOPY WITH ENDOBRONCHIAL NAVIGATION;  Surgeon: Loreli Slot, MD;  Location: [facility];  Service: Thoracic;  Laterality: N/A;   VIDEO BRONCHOSCOPY WITH ENDOBRONCHIAL ULTRASOUND N/A 10/24/2023   Procedure: VIDEO BRONCHOSCOPY WITH ENDOBRONCHIAL ULTRASOUND;  Surgeon: Loreli Slot, MD;  Location: [facility];  Service: Thoracic;  Laterality: N/A;   VIDEO BRONCHOSCOPY WITH INSERTION OF INTERBRONCHIAL VALVE (IBV) N/A 12/11/2018   Procedure: VIDEO BRONCHOSCOPY WITH INSERTION OF INTERBRONCHIAL VALVE (IBV);  Surgeon: Loreli Slot, MD;  Location: [facility] OR;  Service: Thoracic;  Laterality: N/A;   VIDEO BRONCHOSCOPY WITH INSERTION OF INTERBRONCHIAL VALVE (IBV) N/A 02/10/2019   Procedure: VIDEO BRONCHOSCOPY WITH REMOVAL OF INTERBRONCHIAL VALVE (IBV);  Surgeon: Loreli Slot, MD;  Location: [facility] OR;  Service: Thoracic;  Laterality: N/A;    Social History: Social History   Socioeconomic History   Marital status: Married    Spouse name: Not on  file   Number of children: 2   Years of education: Not on file   Highest education level: 9th grade  Occupational History   Occupation: Vinyl siding   Occupation: Retired  Tobacco Use   Smoking status: Former    Current packs/day: 0.00    Average packs/day: 1 pack/day for 40.0 years (40.0 ttl pk-yrs)    Types: Cigarettes    Start date: 05/28/1976    Quit date: 05/28/2016    Years since quitting: 7.5   Smokeless tobacco: Never  Vaping Use   Vaping status: Never Used  Substance and Sexual Activity   Alcohol use: Not Currently    Comment: quit- 2016    Drug use: No   Sexual activity: Not Currently  Other Topics Concern   Not on file  Social History Narrative   Married for 20 years,second.Lives with wife. 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   Social Drivers of Health   Financial Resource Strain: Patient Declined (07/11/2023)   Overall Financial Resource Strain (CARDIA)    Difficulty of Paying Living Expenses: Patient declined  Food Insecurity: No Food Insecurity (09/17/2023)   Hunger Vital Sign    Worried About Running Out of Food in the Last Year: Never true    Ran Out of Food in the Last Year: Never true  Transportation Needs: No Transportation Needs (09/17/2023)   PRAPARE - Administrator, Civil Service (Medical): No    Lack of Transportation (Non-Medical): No  Physical Activity: Insufficiently Active (07/11/2023)   Exercise Vital Sign    Days of Exercise per Week: 3 days    Minutes of Exercise per Session: 10 min  Stress: Patient Declined (07/11/2023)   [employer] of Occupational Health - Occupational Stress Questionnaire    Feeling of Stress : Patient declined  Social Connections: Unknown (07/11/2023)   Social Connection and Isolation Panel [NHANES]    Frequency of Communication with Friends and Family: Twice a week    Frequency of Social Gatherings with Friends and Family: Once a week    Attends Religious Services: Patient declined    Database administrator or Organizations: No    Attends Banker Meetings: Never    Marital Status: Married  Catering manager Violence: Patient Unable To Answer (09/17/2023)   Humiliation, Afraid, Rape, and Kick questionnaire    Fear of Current or Ex-Partner: Patient unable to answer    Emotionally Abused: Patient unable to answer    Physically Abused: Patient unable to answer    Sexually Abused: Patient unable to answer    Family History: Family History  Problem Relation Age of Onset   Cancer Mother    Cancer Sister    Aneurysm Sister    Kidney  disease Paternal Aunt    Asthma Brother    Cancer - Lung Brother    Cancer Brother    Arthritis Brother     Current Medications:  Current Outpatient Medications:    acetaminophen (TYLENOL) 325 MG tablet, Take 2 tablets (650 mg total) by mouth every 6 (six) hours as needed for mild pain (or Fever >/= 101)., Disp: , Rfl:    atorvastatin (LIPITOR) 80 MG tablet, Take 80 mg by mouth daily., Disp: , Rfl:    Budeson-Glycopyrrol-Formoterol (BREZTRI AEROSPHERE) 160-9-4.8 MCG/ACT AERO, Inhale 2 puffs into the lungs in the morning and at bedtime., Disp: , Rfl:    cetirizine (ZYRTEC) 10 MG tablet, Take 10 mg by mouth daily., Disp: , Rfl:    Cholecalciferol (VITAMIN D3) 125  MCG (5000 UT) CAPS, Take 1 capsule (5,000 Units total) by mouth daily., Disp: 30 capsule, Rfl: 0   diltiazem (CARDIZEM CD) 180 MG 24 hr capsule, Take 1 capsule (180 mg total) by mouth daily., Disp: 90 capsule, Rfl: 3   fenofibrate (TRICOR) 145 MG tablet, Take 1 tablet (145 mg total) by mouth daily., Disp: 90 tablet, Rfl: 1   finasteride (PROSCAR) 5 MG tablet, Take 1 tablet (5 mg total) by mouth daily., Disp: 30 tablet, Rfl: 3   guaiFENesin (MUCINEX) 600 MG 12 hr tablet, Take by mouth 2 (two) times daily., Disp: , Rfl:    ipratropium-albuterol (DUONEB) 0.5-2.5 (3) MG/3ML SOLN, USE 1 AMPULE IN NEBULIZER 4 TIMES DAILY AND AS NEEDED FOR SHORTNESS OF BREATH AND FOR WHEEZING, Disp: 450 mL, Rfl: 0   meclizine (ANTIVERT) 25 MG tablet, Take 1 tablet (25 mg total) by mouth 3 (three) times daily as needed for dizziness., Disp: 30 tablet, Rfl: 0   metoprolol succinate (TOPROL XL) 50 MG 24 hr tablet, Take 1 tablet (50 mg total) by mouth daily. Take with or immediately following a meal., Disp: 30 tablet, Rfl: 2   Omega-3 Fatty Acids (FISH OIL ULTRA PO), Take 1,200 mg by mouth in the morning and at bedtime., Disp: , Rfl:    OXYGEN, Inhale 2 L into the lungs continuous. Hx of lung ca, copd, and emphysema, Disp: , Rfl:    pantoprazole (PROTONIX) 40 MG  tablet, Take 1 tablet (40 mg total) by mouth daily., Disp: 30 tablet, Rfl: 1   polyethylene glycol (MIRALAX / GLYCOLAX) 17 g packet, Take 17 g by mouth daily as needed for mild constipation., Disp: , Rfl:    rosuvastatin (CRESTOR) 40 MG tablet, Take 1 tablet (40 mg total) by mouth daily., Disp: 90 tablet, Rfl: 3   tamsulosin (FLOMAX) 0.4 MG CAPS capsule, Take 2 capsules (0.8 mg total) by mouth daily., Disp: 180 capsule, Rfl: 3   warfarin (COUMADIN) 5 MG tablet, Take 1 tablet (5 mg total) by mouth daily. (Patient taking differently: Take 2.5-5 mg by mouth See admin instructions. Take 2.5 mg by mouth Tuesday and Friday and take 5 mg on Monday, Wednesday, Thursday, Saturday and Sunday), Disp: 90 tablet, Rfl: 1   Allergies: No Known Allergies  REVIEW OF SYSTEMS:   Review of Systems  Constitutional:  Negative for chills, fatigue and fever.  HENT:   Negative for lump/mass, mouth sores, nosebleeds, sore throat and trouble swallowing.   Eyes:  Negative for eye problems.  Respiratory:  Negative for cough and shortness of breath.   Cardiovascular:  Negative for chest pain, leg swelling and palpitations.  Gastrointestinal:  Negative for abdominal pain, constipation, diarrhea, nausea and vomiting.  Genitourinary:  Negative for bladder incontinence, difficulty urinating, dysuria, frequency, hematuria and nocturia.   Musculoskeletal:  Negative for arthralgias, back pain, flank pain, myalgias and neck pain.  Skin:  Negative for itching and rash.  Neurological:  Positive for headaches. Negative for dizziness and numbness.  Hematological:  Does not bruise/bleed easily.  Psychiatric/Behavioral:  Positive for sleep disturbance. Negative for depression and suicidal ideas. The patient is not nervous/anxious.   All other systems reviewed and are negative.    VITALS:   Blood pressure 117/75, pulse 60, temperature 98.2 F (36.8 C), temperature source Tympanic, resp. rate 18, height 5\' 10"  (1.778 m), weight 210  lb (95.3 kg), SpO2 100%.  Wt Readings from Last 3 Encounters:  12/03/23 210 lb (95.3 kg)  11/26/23 216 lb 12.8 oz (98.3 kg)  11/11/23 216 lb (98 kg)    Body mass index is 30.13 kg/m.  Performance status (ECOG): 1 - Symptomatic but completely ambulatory  PHYSICAL EXAM:   Physical Exam Vitals and nursing note reviewed. Exam conducted with a chaperone present.  Constitutional:      Appearance: Normal appearance.  Cardiovascular:     Rate and Rhythm: Normal rate and regular rhythm.     Pulses: Normal pulses.     Heart sounds: Normal heart sounds.  Pulmonary:     Effort: Pulmonary effort is normal.     Breath sounds: Normal breath sounds.  Abdominal:     Palpations: Abdomen is soft. There is no hepatomegaly, splenomegaly or mass.     Tenderness: There is no abdominal tenderness.  Musculoskeletal:     Right lower leg: No edema.     Left lower leg: No edema.  Lymphadenopathy:     Cervical: No cervical adenopathy.     Right cervical: No superficial, deep or posterior cervical adenopathy.    Left cervical: No superficial, deep or posterior cervical adenopathy.     Upper Body:     Right upper body: No supraclavicular or axillary adenopathy.     Left upper body: No supraclavicular or axillary adenopathy.  Neurological:     General: No focal deficit present.     Mental Status: He is alert and oriented to person, place, and time.  Psychiatric:        Mood and Affect: Mood normal.        Behavior: Behavior normal.     LABS:   CBC    Component Value Date/Time   WBC 6.9 11/26/2023 1040   WBC 6.7 10/21/2023 1310   RBC 5.06 11/26/2023 1040   HGB 13.6 11/26/2023 1040   HGB 14.8 07/15/2023 1142   HGB 13.5 04/09/2017 1249   HCT 44.3 11/26/2023 1040   HCT 48.2 07/15/2023 1142   HCT 42.4 04/09/2017 1249   PLT 197 11/26/2023 1040   PLT 205 07/15/2023 1142   MCV 87.5 11/26/2023 1040   MCV 86 07/15/2023 1142   MCV 84.8 04/09/2017 1249   MCH 26.9 11/26/2023 1040   MCHC 30.7  11/26/2023 1040   RDW 15.0 11/26/2023 1040   RDW 13.1 07/15/2023 1142   RDW 15.4 (H) 04/09/2017 1249   LYMPHSABS 1.1 11/26/2023 1040   LYMPHSABS 1.1 07/31/2021 1026   LYMPHSABS 1.0 04/09/2017 1249   MONOABS 0.8 11/26/2023 1040   MONOABS 1.9 (H) 04/09/2017 1249   EOSABS 0.1 11/26/2023 1040   EOSABS 0.1 07/31/2021 1026   BASOSABS 0.0 11/26/2023 1040   BASOSABS 0.0 07/31/2021 1026   BASOSABS 0.0 04/09/2017 1249    CMP    Component Value Date/Time   NA 139 11/26/2023 1040   NA 140 07/15/2023 1142   NA 136 04/09/2017 1249   K 4.4 11/26/2023 1040   K 4.6 04/09/2017 1249   CL 101 11/26/2023 1040   CO2 34 (H) 11/26/2023 1040   CO2 27 04/09/2017 1249   GLUCOSE 85 11/26/2023 1040   GLUCOSE 103 04/09/2017 1249   BUN 18 11/26/2023 1040   BUN 13 07/15/2023 1142   BUN 23.9 04/09/2017 1249   CREATININE 1.10 11/26/2023 1040   CREATININE 1.21 (H) 05/08/2020 1051   CREATININE 1.4 (H) 04/09/2017 1249   CALCIUM 9.4 11/26/2023 1040   CALCIUM 9.6 04/09/2017 1249   PROT 7.2 11/26/2023 1040   PROT 6.9 03/04/2023 1515   PROT 7.8 04/09/2017 1249   ALBUMIN 4.5 11/26/2023  1040   ALBUMIN 4.6 03/04/2023 1515   ALBUMIN 3.6 04/09/2017 1249   AST 20 11/26/2023 1040   AST 13 04/09/2017 1249   ALT 19 11/26/2023 1040   ALT 9 04/09/2017 1249   ALKPHOS 34 (L) 11/26/2023 1040   ALKPHOS 58 04/09/2017 1249   BILITOT 0.7 11/26/2023 1040   BILITOT 1.01 04/09/2017 1249   GFRNONAA >60 11/26/2023 1040   GFRNONAA 60 05/08/2020 1051   GFRAA >60 06/02/2020 1239   GFRAA 70 05/08/2020 1051     No results found for: "CEA1", "CEA" / No results found for: "CEA1", "CEA" Lab Results  Component Value Date   PSA1 3.5 07/05/2021   No results found for: "WGN562" No results found for: "CAN125"  No results found for: "TOTALPROTELP", "ALBUMINELP", "A1GS", "A2GS", "BETS", "BETA2SER", "GAMS", "MSPIKE", "SPEI" No results found for: "TIBC", "FERRITIN", "IRONPCTSAT" No results found for: "LDH"   STUDIES:   MR  BRAIN WO CONTRAST Result Date: 12/02/2023 CLINICAL DATA:  73 year old male with non-small cell lung cancer. Possible recurrence in the chest in November. A study without and with contrast was planned but the patient declined postcontrast imaging, declined IV contrast injection. Only noncontrast images are provided. EXAM: MRI HEAD WITHOUT CONTRAST TECHNIQUE: Multiplanar, multiecho pulse sequences of the brain and surrounding structures were obtained without intravenous contrast. 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 Subsequent encephalomalacia including involvement of the mesial temporal lobe, both thalami. Mild associated hemosiderin on SWI which results in DWI artifact. Some generalized cerebral volume loss since 2022. ex vacuo appearing ventricular enlargement. No restricted diffusion or evidence of acute infarction. No midline shift, mass effect, or evidence of intracranial mass lesion. Chronic but increased periventricular white matter T2 and FLAIR hyperintensity since 2022. No other cortical encephalomalacia or chronic cerebral blood products identified. Brainstem and cerebellum remain negative. Vascular: Major intracranial vascular flow voids are stable since 2022. Dominant distal left vertebral artery suspected. Skull and upper cervical spine: Sagittal T1 weighted imaging degraded by motion artifact. Visible bone marrow signal is within normal limits. Sinuses/Orbits: Stable, negative. Other: Mastoids are clear. Grossly normal visible internal auditory structures. IMPRESSION: 1. The patient declined IV contrast injection. No strong evidence of cerebral metastatic disease on this noncontrast exam. 2. 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 Encephalomalacia and mild hemosiderin. No acute intracranial abnormality identified. Electronically Signed   By: Odessa Fleming M.D.    On: 12/02/2023 09:21

## 2023-12-03 ENCOUNTER — Inpatient Hospital Stay: Payer: Medicare Other | Attending: Internal Medicine | Admitting: Hematology

## 2023-12-03 ENCOUNTER — Encounter: Payer: Self-pay | Admitting: Hematology

## 2023-12-03 ENCOUNTER — Inpatient Hospital Stay: Payer: Medicare Other

## 2023-12-03 VITALS — BP 117/75 | HR 60 | Temp 98.2°F | Resp 18 | Ht 70.0 in | Wt 210.0 lb

## 2023-12-03 DIAGNOSIS — C3490 Malignant neoplasm of unspecified part of unspecified bronchus or lung: Secondary | ICD-10-CM | POA: Diagnosis not present

## 2023-12-03 DIAGNOSIS — Z8673 Personal history of transient ischemic attack (TIA), and cerebral infarction without residual deficits: Secondary | ICD-10-CM | POA: Diagnosis not present

## 2023-12-03 DIAGNOSIS — I4891 Unspecified atrial fibrillation: Secondary | ICD-10-CM | POA: Insufficient documentation

## 2023-12-03 DIAGNOSIS — I10 Essential (primary) hypertension: Secondary | ICD-10-CM | POA: Diagnosis not present

## 2023-12-03 DIAGNOSIS — C770 Secondary and unspecified malignant neoplasm of lymph nodes of head, face and neck: Secondary | ICD-10-CM | POA: Insufficient documentation

## 2023-12-03 DIAGNOSIS — G4730 Sleep apnea, unspecified: Secondary | ICD-10-CM | POA: Insufficient documentation

## 2023-12-03 DIAGNOSIS — Z801 Family history of malignant neoplasm of trachea, bronchus and lung: Secondary | ICD-10-CM | POA: Diagnosis not present

## 2023-12-03 DIAGNOSIS — E785 Hyperlipidemia, unspecified: Secondary | ICD-10-CM | POA: Insufficient documentation

## 2023-12-03 DIAGNOSIS — Z87442 Personal history of urinary calculi: Secondary | ICD-10-CM | POA: Insufficient documentation

## 2023-12-03 DIAGNOSIS — Z7901 Long term (current) use of anticoagulants: Secondary | ICD-10-CM | POA: Insufficient documentation

## 2023-12-03 DIAGNOSIS — J439 Emphysema, unspecified: Secondary | ICD-10-CM | POA: Insufficient documentation

## 2023-12-03 DIAGNOSIS — Z79899 Other long term (current) drug therapy: Secondary | ICD-10-CM | POA: Diagnosis not present

## 2023-12-03 DIAGNOSIS — I739 Peripheral vascular disease, unspecified: Secondary | ICD-10-CM | POA: Diagnosis not present

## 2023-12-03 DIAGNOSIS — I714 Abdominal aortic aneurysm, without rupture, unspecified: Secondary | ICD-10-CM | POA: Diagnosis not present

## 2023-12-03 DIAGNOSIS — Z87891 Personal history of nicotine dependence: Secondary | ICD-10-CM | POA: Diagnosis not present

## 2023-12-03 DIAGNOSIS — Z5111 Encounter for antineoplastic chemotherapy: Secondary | ICD-10-CM | POA: Insufficient documentation

## 2023-12-03 DIAGNOSIS — Z9221 Personal history of antineoplastic chemotherapy: Secondary | ICD-10-CM | POA: Diagnosis not present

## 2023-12-03 DIAGNOSIS — M129 Arthropathy, unspecified: Secondary | ICD-10-CM | POA: Insufficient documentation

## 2023-12-03 DIAGNOSIS — C3491 Malignant neoplasm of unspecified part of right bronchus or lung: Secondary | ICD-10-CM | POA: Diagnosis not present

## 2023-12-03 DIAGNOSIS — Z8041 Family history of malignant neoplasm of ovary: Secondary | ICD-10-CM | POA: Insufficient documentation

## 2023-12-03 DIAGNOSIS — C3411 Malignant neoplasm of upper lobe, right bronchus or lung: Secondary | ICD-10-CM | POA: Insufficient documentation

## 2023-12-03 DIAGNOSIS — G9389 Other specified disorders of brain: Secondary | ICD-10-CM | POA: Insufficient documentation

## 2023-12-03 NOTE — Progress Notes (Signed)
START OFF PATHWAY REGIMEN - Non-Small Cell Lung   [id number]:Carboplatin + Paclitaxel (2/50) + RT weekly x 6 weeks:   Administer weekly during RT:     Paclitaxel      Carboplatin   **Always confirm dose/schedule in your pharmacy ordering system**  Patient Characteristics: Local Recurrence Therapeutic Status: Local Recurrence Intent of Therapy: Curative Intent, Discussed with Patient

## 2023-12-03 NOTE — Patient Instructions (Addendum)
 [facility]  Discharge Instructions  You were seen and examined today by Dr. Ellin Saba. Dr. Ellin Saba is a medical oncologist, meaning that he specializes in the treatment of cancer diagnoses. Dr. Ellin Saba discussed your past medical history, family history of cancers, and the events that led to you being here today.  You were referred to Dr. Ellin Saba for ongoing management of your lung cancer. Your lung cancer has returned, still in the lung and again this time with lymph node involvement - making it a Stage III Lung Cancer. This is typically treated with concurrent chemotherapy and radiation.   Radiation is given once daily at [facility] while chemotherapy is given once weekly here at [facility]. In order to safely administer chemotherapy, we will need a Port-A-Cath. We can arrange for this to be placed by the interventional radiologist.  Please keep your appointment with Dr. Langston Masker as scheduled for tomorrow.  Follow-up as scheduled.  Thank you for choosing  [facility] to provide your oncology and hematology care.   To afford each patient quality time with our provider, please arrive at least 15 minutes before your scheduled appointment time. You may need to reschedule your appointment if you arrive late (10 or more minutes). Arriving late affects you and other patients whose appointments are after yours.  Also, if you miss three or more appointments without notifying the office, you may be dismissed from the clinic at the provider's discretion.    Again, thank you for choosing [facility].  Our hope is that these requests will decrease the amount of time that you wait before being seen by our physicians.   If you have a lab appointment with the Cancer Center - please note that after April 8th, all labs will be drawn in the cancer center.  You do not have to check in or register with the main entrance as  you have in the past but will complete your check-in at the cancer center.            _____________________________________________________________  Should you have questions after your visit to [facility], please contact our office at [phone] and follow the prompts.  Our office hours are 8:00 a.m. to 4:30 p.m. Monday - Thursday and 8:00 a.m. to 2:30 p.m. Friday.  Please note that voicemails left after 4:00 p.m. may not be returned until the following business day.  We are closed weekends and all major holidays.  You do have access to a nurse 24-7, just call the main number to the clinic [phone] and do not press any options, hold on the line and a nurse will answer the phone.    For prescription refill requests, have your pharmacy contact our office and allow 72 hours.    Masks are no longer required in the cancer centers. If you would like for your care team to wear a mask while they are taking care of you, please let them know. You may have one support person who is at least 74 years old accompany you for your appointments.

## 2023-12-04 ENCOUNTER — Other Ambulatory Visit: Payer: Self-pay

## 2023-12-04 ENCOUNTER — Encounter: Payer: Self-pay | Admitting: Hematology

## 2023-12-04 ENCOUNTER — Encounter (HOSPITAL_COMMUNITY): Payer: Self-pay | Admitting: Internal Medicine

## 2023-12-04 DIAGNOSIS — I10 Essential (primary) hypertension: Secondary | ICD-10-CM | POA: Diagnosis not present

## 2023-12-04 DIAGNOSIS — C3490 Malignant neoplasm of unspecified part of unspecified bronchus or lung: Secondary | ICD-10-CM

## 2023-12-04 DIAGNOSIS — Z510 Encounter for antineoplastic radiation therapy: Secondary | ICD-10-CM | POA: Diagnosis not present

## 2023-12-04 DIAGNOSIS — C3491 Malignant neoplasm of unspecified part of right bronchus or lung: Secondary | ICD-10-CM

## 2023-12-04 DIAGNOSIS — C3411 Malignant neoplasm of upper lobe, right bronchus or lung: Secondary | ICD-10-CM | POA: Diagnosis not present

## 2023-12-04 DIAGNOSIS — Z8673 Personal history of transient ischemic attack (TIA), and cerebral infarction without residual deficits: Secondary | ICD-10-CM | POA: Diagnosis not present

## 2023-12-04 DIAGNOSIS — E785 Hyperlipidemia, unspecified: Secondary | ICD-10-CM | POA: Diagnosis not present

## 2023-12-04 DIAGNOSIS — C771 Secondary and unspecified malignant neoplasm of intrathoracic lymph nodes: Secondary | ICD-10-CM | POA: Diagnosis not present

## 2023-12-04 MED ORDER — ALPRAZOLAM 0.5 MG PO TABS: 0.5000 mg | ORAL_TABLET | Freq: Once | ORAL | 0 refills | Status: AC

## 2023-12-04 NOTE — Progress Notes (Signed)
 Order placed for MRI brain with contrast per Dr. Ellin Saba. Patient states that he thinks he can proceed with the MRI with contrast with anti anxiety medication to complete staging work-up

## 2023-12-05 NOTE — Telephone Encounter (Signed)
Completed and faxed with confirmation.

## 2023-12-08 ENCOUNTER — Telehealth: Payer: Self-pay | Admitting: Dietician

## 2023-12-08 ENCOUNTER — Other Ambulatory Visit (HOSPITAL_COMMUNITY): Payer: Self-pay | Admitting: Student

## 2023-12-08 ENCOUNTER — Inpatient Hospital Stay: Admitting: Dietician

## 2023-12-08 NOTE — Telephone Encounter (Signed)
 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 S/p resection by Dr. Dorris Fetch (2017). Patient is planning concurrent chemoradiation with weekly carbo/taxol (first chemo planned 3/25). Patient is under the care of Dr. Ellin Saba.   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 Wife reports patient is laying down for a nap. She is agreeable to provide nutrition history. Wife reports patient has a good appetite and eats well. He does not cook for himself. Wife keeps prepared foods and there are usually left overs that pt can heat up while wife is working. Patient typically does not eat breakfast, unless wife cooks on the weekends. He is drinking lots of water. Wife stays on him about this. Patient does not have NIS at this time.   Nutrition Focused Physical Exam: deferred (telephone visit)   Medications: lipitor, D3, tricor, proscar, mucinex, antivert, toprol, fish oil, protonix, miralax, crestor, flomax, coumadin   Labs: 2/26 labs reviewed    Anthropometrics:   Height: 5'10" Weight: 210 lb (3/5) UBW: 221 lb (12/4) BMI: 30.13   NUTRITION DIAGNOSIS: Unintended wt loss related to recurrent lung cancer as evidenced by 5% decrease from usual wt in 3 months - insignificant for time frame, however concerning given history    INTERVENTION:  Educated on importance of adequate calorie and protein energy intake to maintain LBM Educate on foods with protein, recommend protein source at every meal/snack Suggested ONS for added calories and protein  Will mail handouts with snack ideas/soft moist high protein foods Will leave Ensure samples for pt to pick up 3/18 appointment     MONITORING, EVALUATION, GOAL: Pt will tolerate adequate calories and protein to minimize wt loss during treatment    Next Visit: Thursday April 3 via telephone

## 2023-12-08 NOTE — Patient Instructions (Addendum)
Randall Sullivan will see the doctor regularly throughout treatment.  We will obtain blood work from you prior to every treatment and monitor your results to make sure it is safe to give your treatment. The doctor monitors your response to treatment by the way you are feeling, your blood work, and by obtaining scans periodically.  There will be wait times while you are here for treatment.  It will take about 30 minutes to 1 hour for your lab work to result.  Then there will be wait times while pharmacy mixes your medications.   Medications you will receive in the clinic prior to your chemotherapy medications:   Aloxi:  ALOXI is used in adults to help prevent the nausea and vomiting that happens with certain chemotherapy drugs.  Aloxi is a long acting medication, and will remain in your system for about 2 days.    Dexamethasone:  This is a steroid given prior to chemotherapy to help prevent allergic reactions; it may also help prevent and control nausea and diarrhea.    Pepcid:  This medication is a histamine blocker that helps prevent and allergic reaction to your chemotherapy.    Benadryl:  This is a histamine blocker (different from the Pepcid) that helps prevent allergic/infusion reactions to your chemotherapy. This medication may cause dizziness/drowsiness.      Paclitaxel (Taxol)  About This Drug Paclitaxel is a drug used to treat cancer. It is given in the vein (IV).  This will take 1 hour to infuse.  This first infusion will take longer to infuse because it is increased slowly to monitor for reactions.  The nurse will be in the room with you for the first 15 minutes of the first infusion.  Possible Side Effects   Hair loss. Hair loss is often temporary, although with certain medicine, hair loss can sometimes be permanent. Hair loss may happen suddenly or gradually. If you lose hair, you may lose it from your head, face, armpits, pubic area, chest,  and/or legs. You may also notice your hair getting thin.   Swelling of your legs, ankles and/or feet (edema)   Flushing   Nausea and throwing up (vomiting)   Loose bowel movements (diarrhea)   Bone marrow depression. This is a decrease in the number of white blood cells, red blood cells, and platelets. This may raise your risk of infection, make you tired and weak (fatigue), and raise your risk of bleeding.   Effects on the nerves are called peripheral neuropathy. You may feel numbness, tingling, or pain in your hands and feet. It may be hard for you to button your clothes, open jars, or walk as usual. The effect on the nerves may get worse with more doses of the drug. These effects get better in some people after the drug is stopped but it does not get better in all people.   Changes in your liver function   Bone, joint and muscle pain   Abnormal EKG   Allergic reaction: Allergic reactions, including anaphylaxis are rare but may happen in some patients. Signs of allergic reaction to this drug may be swelling of the face, feeling like your tongue or throat are swelling, trouble breathing, rash, itching, fever, chills, feeling dizzy, and/or feeling that your heart is beating in a fast or not normal way. If this happens, do not take another dose of this drug. You should get urgent medical treatment.   Infection   Changes in your  kidney function.  Note: Each of the side effects above was reported in 20% or greater of patients treated with paclitaxel. Not all possible side effects are included above.  Warnings and Precautions   Severe allergic reactions   Severe bone marrow depression  Treating Side Effects   To help with hair loss, wash with a mild shampoo and avoid washing your hair every day.   Avoid rubbing your scalp, instead, pat your hair or scalp dry   Avoid coloring your hair   Limit your use of hair spray, electric curlers, blow dryers, and curling irons.   If you  are interested in getting a wig, talk to your nurse. You can also call the [facility] at 800-ACS-2345 to find out information about the "Look Good, Feel Better" program close to where you live. It is a free program where women getting chemotherapy can learn about wigs, turbans and scarves as well as makeup techniques and skin and nail care.   Ask your doctor or nurse about medicines that are available to help stop or lessen diarrhea and/or nausea.   To help with nausea and vomiting, eat small, frequent meals instead of three large meals a day. Choose foods and drinks that are at room temperature. Ask your nurse or doctor about other helpful tips and medicine that is available to help or stop lessen these symptoms.   If you get diarrhea, eat low-fiber foods that are high in protein and calories and avoid foods that can irritate your digestive tracts or lead to cramping. Ask your nurse or doctor about medicine that can lessen or stop your diarrhea.   Mouth care is very important. Your mouth care should consist of routine, gentle cleaning of your teeth or dentures and rinsing your mouth with a mixture of 1/2 teaspoon of salt in 8 ounces of water or  teaspoon of baking soda in 8 ounces of water. This should be done at least after each meal and at bedtime.   If you have mouth sores, avoid mouthwash that has alcohol. Also avoid alcohol and smoking because they can bother your mouth and throat.   Drink plenty of fluids (a minimum of eight glasses per day is recommended).   Take your temperature as your doctor or nurse tells you, and whenever you feel like you may have a fever.   Talk to your doctor or nurse about precautions you can take to avoid infections and bleeding.   Be careful when cooking, walking, and handling sharp objects and hot liquids.  Food and Drug Interactions   There are no known interactions of paclitaxel with food.   This drug may interact with other medicines.  Tell your doctor and pharmacist about all the medicines and dietary supplements (vitamins, minerals, herbs and others) that you are taking at this time.   The safety and use of dietary supplements and alternative diets are often not known. Using these might affect your cancer or interfere with your treatment. Until more is known, you should not use dietary supplements or alternative diets without your cancer doctor's help.  When to Call the Doctor  Call your doctor or nurse if you have any of the following symptoms and/or any new or unusual symptoms:   Fever of 100.4 F (38 C) or above   Chills   Redness, pain, warmth, or swelling at the IV site during the infusion   Signs of allergic reaction: swelling of the face, feeling like your tongue or throat are  swelling, trouble breathing, rash, itching, fever, chills, feeling dizzy, and/or feeling that your heart is beating in a fast or not normal way   Feeling that your heart is beating in a fast or not normal way (palpitations)   Weight gain of 5 pounds in one week (fluid retention)   Decreased urine or very dark urine   Signs of liver problems: dark urine, pale bowel movements, bad stomach pain, feeling very tired and weak, unusual  itching, or yellowing of the eyes or skin   Heavy menstrual period that lasts longer than normal   Easy bruising or bleeding   Nausea that stops you from eating or drinking, and/or that is not relieved by prescribed medicines.   Loose bowel movements (diarrhea) more than 4 times a day or diarrhea with weakness or lightheadedness   Pain in your mouth or throat that makes it hard to eat or drink   Lasting loss of appetite or rapid weight loss of five pounds in a week   Signs of peripheral neuropathy: numbness, tingling, or decreased feeling in fingers or toes; trouble walking or changes in the way you walk; or feeling clumsy when buttoning clothes, opening jars, or other routine activities   Joint and  muscle pain that is not relieved by prescribed medicines   Extreme fatigue that interferes with normal activities   While you are getting this drug, please tell your nurse right away if you have any pain, redness, or swelling at the site of the IV infusion.   If you think you are pregnant.  Reproduction Warnings   Pregnancy warning: This drug may have harmful effects on the unborn child, it is recommended that effective methods of birth control should be used during your cancer treatment. Let your doctor know right away if you think you may be pregnant.   Breast feeding warning: Women should not breast feed during treatment because this drug could enter the breastmilk and cause harm to a breast feeding baby.   Carboplatin (Paraplatin, CBDCA)  About This Drug  Carboplatin is used to treat cancer. It is given in the vein (IV).  It will take 30 minutes to infuse.   Possible Side Effects   Bone marrow suppression. This is a decrease in the number of white blood cells, red blood cells, and platelets. This may raise your risk of infection, make you tired and weak (fatigue), and raise your risk of bleeding.   Nausea and vomiting (throwing up)   Weakness   Changes in your liver function   Changes in your kidney function   Electrolyte changes   Pain  Note: Each of the side effects above was reported in 20% or greater of patients treated with carboplatin. Not all possible side effects are included above.   Warnings and Precautions   Severe bone marrow suppression   Allergic reactions, including anaphylaxis are rare but may happen in some patients. Signs of allergic reaction to this drug may be swelling of the face, feeling like your tongue or throat are swelling, trouble breathing, rash, itching, fever, chills, feeling dizzy, and/or feeling that your heart is beating in a fast or not normal way. If this happens, do not take another dose of this drug. You should get urgent medical  treatment.   Severe nausea and vomiting   Effects on the nerves are called peripheral neuropathy. This risk is increased if you are over the age of 99 or if you have received other medicine with risk of peripheral  neuropathy. You may feel numbness, tingling, or pain in your hands and feet. It may be hard for you to button your clothes, open jars, or walk as usual. The effect on the nerves may get worse with more doses of the drug. These effects get better in some people after the drug is stopped but it does not get better in all people.   Blurred vision, loss of vision or other changes in eyesight   Decreased hearing   - Skin and tissue irritation including redness, pain, warmth, or swelling at the IV site if the drug leaks out of the vein and into nearby tissue.   Severe changes in your kidney function, which can cause kidney failure   Severe changes in your liver function, which can cause liver failure  Note: Some of the side effects above are very rare. If you have concerns and/or questions, please discuss them with your medical team.   Important Information   This drug may be present in the saliva, tears, sweat, urine, stool, vomit, semen, and vaginal secretions. Talk to your doctor and/or your nurse about the necessary precautions to take during this time.   Treating Side Effects   Manage tiredness by pacing your activities for the day.   Be sure to include periods of rest between energy-draining activities.   To decrease the risk of infection, wash your hands regularly.   Avoid close contact with people who have a cold, the flu, or other infections.   Take your temperature as your doctor or nurse tells you, and whenever you feel like you may have a fever.   To help decrease the risk of bleeding, use a soft toothbrush. Check with your nurse before using dental floss.   Be very careful when using knives or tools.   Use an electric shaver instead of a razor.   Drink  plenty of fluids (a minimum of eight glasses per day is recommended).   If you throw up or have loose bowel movements, you should drink more fluids so that you do not become dehydrated (lack of water in the body from losing too much fluid).   To help with nausea and vomiting, eat small, frequent meals instead of three large meals a day. Choose foods and drinks that are at room temperature. Ask your nurse or doctor about other helpful tips and medicine that is available to help stop or lessen these symptoms.   If you have numbness and tingling in your hands and feet, be careful when cooking, walking, and handling sharp objects and hot liquids.   Keeping your pain under control is important to your well-being. Please tell your doctor or nurse if you are experiencing pain.   Food and Drug Interactions   There are no known interactions of carboplatin with food.   This drug may interact with other medicines. Tell your doctor and pharmacist about all the prescription and over-the-counter medicines and dietary supplements (vitamins, minerals, herbs and others) that you are taking at this time. Also, check with your doctor or pharmacist before starting any new prescription or over-the-counter medicines, or dietary supplements to make sure that there are no interactions.   When to Call the Doctor  Call your doctor or nurse if you have any of these symptoms and/or any new or unusual symptoms:   Fever of 100.4 F (38 C) or higher   Chills   Tiredness that interferes with your daily activities   Feeling dizzy or lightheaded   Easy  bleeding or bruising   Nausea that stops you from eating or drinking and/or is not relieved by prescribed medicines   Throwing up   Blurred vision or other changes in eyesight   Decrease in hearing or ringing in the ear   Signs of allergic reaction: swelling of the face, feeling like your tongue or throat are swelling, trouble breathing, rash, itching, fever,  chills, feeling dizzy, and/or feeling that your heart is beating in a fast or not normal way. If this happens, call 911 for emergency care.   Signs of possible liver problems: dark urine, pale bowel movements, bad stomach pain, feeling very tired and weak, unusual itching, or yellowing of the eyes or skin   Decreased urine, or very dark urine   Numbness, tingling, or pain in your hands and feet   Pain that does not go away or is not relieved by prescribed medicine   While you are getting this drug, please tell your nurse right away if you have any pain, redness, or swelling at the site of the IV infusion, or if you have any new onset of symptoms, or if you just feel "different" from before when the infusion was started.   Reproduction Warnings   Pregnancy warning: This drug may have harmful effects on the unborn baby. Women of child bearing potential should use effective methods of birth control during your cancer treatment. Let your doctor know right away if you think you may be pregnant.   Breastfeeding warning: It is not known if this drug passes into breast milk. For this reason, women should not breastfeed during treatment because this drug could enter the breast milk and cause harm to a breastfeeding baby.   Fertility warning: Human fertility studies have not been done with this drug. Talk with your doctor or nurse if you plan to have children. Ask for information on sperm or egg banking.   SELF CARE ACTIVITIES WHILE RECEIVING CHEMOTHERAPY:  Hydration Increase your fluid intake 48 hours prior to treatment and drink at least 8 to 12 cups (64 ounces) of water/decaffeinated beverages per day after treatment. You can still have your cup of coffee or soda but these beverages do not count as part of your 8 to 12 cups that you need to drink daily. No alcohol intake.  Medications Continue taking your normal prescription medication as prescribed.  If you start any new herbal or new  supplements please let us know first to make sure it is safe.  Mouth Care Have teeth cleaned professionally before starting treatment. Keep dentures and partial plates clean. Use soft toothbrush and do not use mouthwashes that contain alcohol. Biotene is a good mouthwash that is available at most pharmacies or may be ordered by calling [phone]. Use warm salt water gargles (1 teaspoon salt per 1 quart warm water) before and after meals and at bedtime. If you need dental work, please let the doctor know before you go for your appointment so that we can coordinate the best possible time for you in regards to your chemo regimen. You need to also let your dentist know that you are actively taking chemo. We may need to do labs prior to your dental appointment.  Skin Care Always use sunscreen that has not expired and with SPF (Sun Protection Factor) of 50 or higher. Wear hats to protect your head from the sun. Remember to use sunscreen on your hands, ears, face, & feet.  Use good moisturizing lotions such as udder cream,  eucerin, or even Vaseline. Some chemotherapies can cause dry skin, color changes in your skin and nails.    Avoid long, hot showers or baths. Use gentle, fragrance-free soaps and laundry detergent. Use moisturizers, preferably creams or ointments rather than lotions because the thicker consistency is better at preventing skin dehydration. Apply the cream or ointment within 15 minutes of showering. Reapply moisturizer at night, and moisturize your hands every time after you wash them.  Hair Loss (if your doctor says your hair will fall out)  If your doctor says that your hair is likely to fall out, decide before you begin chemo whether you want to wear a wig. You may want to shop before treatment to match your hair color. Hats, turbans, and scarves can also camouflage hair loss, although some people prefer to leave their heads uncovered. If you go bare-headed outdoors, be sure to use  sunscreen on your scalp. Cut your hair short. It eases the inconvenience of shedding lots of hair, but it also can reduce the emotional impact of watching your hair fall out. Don't perm or color your hair during chemotherapy. Those chemical treatments are already damaging to hair and can enhance hair loss. Once your chemo treatments are done and your hair has grown back, it's OK to resume dyeing or perming hair.  With chemotherapy, hair loss is almost always temporary. But when it grows back, it may be a different color or texture. In older adults who still had hair color before chemotherapy, the new growth may be completely gray.  Often, new hair is very fine and soft.  Infection Prevention Please wash your hands for at least 30 seconds using warm soapy water. Handwashing is the #1 way to prevent the spread of germs. Stay away from sick people or people who are getting over a cold. If you develop respiratory systems such as green/yellow mucus production or productive cough or persistent cough let us know and we will see if you need an antibiotic. It is a good idea to keep a pair of gloves on when going into grocery stores/[employer] to decrease your risk of coming into contact with germs on the carts, etc. Carry alcohol hand gel with you at all times and use it frequently if out in public. If your temperature reaches 100.4 or higher please call the clinic and let us know.  If it is after hours or on the weekend please go to the ER if your temperature is over 100.4.  Please have your own personal thermometer at home to use.    Sex and bodily fluids If you are going to have sex, a condom must be used to protect the person that isn't taking chemotherapy. Chemo can decrease your libido (sex drive). For a few days after chemotherapy, chemotherapy can be excreted through your bodily fluids.  When using the toilet please close the lid and flush the toilet twice.  Do this for a few day after you have had  chemotherapy.   Effects of chemotherapy on your sex life Some changes are simple and won't last long. They won't affect your sex life permanently.  Sometimes you may feel: too tired not strong enough to be very active sick or sore  not in the mood anxious or low  Your anxiety might not seem related to sex. For example, you may be worried about the cancer and how your treatment is going. Or you may be worried about money, or about how you family are coping with your  illness.  These things can cause stress, which can affect your interest in sex. It's important to talk to your partner about how you feel.  Remember - the changes to your sex life don't usually last long. There's usually no medical reason to stop having sex during chemo. The drugs won't have any long term physical effects on your performance or enjoyment of sex. Cancer can't be passed on to your partner during sex  Contraception It's important to use reliable contraception during treatment. Avoid getting pregnant while you or your partner are having chemotherapy. This is because the drugs may harm the baby. Sometimes chemotherapy drugs can leave a man or woman infertile.  This means you would not be able to have children in the future. You might want to talk to someone about permanent infertility. It can be very difficult to learn that you may no longer be able to have children. Some people find counselling helpful. There might be ways to preserve your fertility, although this is easier for men than for women. You may want to speak to a fertility expert. You can talk about sperm banking or harvesting your eggs. You can also ask about other fertility options, such as donor eggs. If you have or have had breast cancer, your doctor might advise you not to take the contraceptive pill. This is because the hormones in it might affect the cancer. It is not known for sure whether or not chemotherapy drugs can be passed on through semen or secretions  from the vagina. Because of this some doctors advise people to use a barrier method if you have sex during treatment. This applies to vaginal, anal or oral sex. Generally, doctors advise a barrier method only for the time you are actually having the treatment and for about a week after your treatment. Advice like this can be worrying, but this does not mean that you have to avoid being intimate with your partner. You can still have close contact with your partner and continue to enjoy sex.  Animals If you have cats or birds we just ask that you not change the litter or change the cage.  Please have someone else do this for you while you are on chemotherapy.   Food Safety During and After Cancer Treatment Food safety is important for people both during and after cancer treatment. Cancer and cancer treatments, such as chemotherapy, radiation therapy, and stem cell/bone marrow transplantation, often weaken the immune system. This makes it harder for your body to protect itself from foodborne illness, also called food poisoning. Foodborne illness is caused by eating food that contains harmful bacteria, parasites, or viruses.  Foods to avoid Some foods have a higher risk of becoming tainted with bacteria. These include: Unwashed fresh fruit and vegetables, especially leafy vegetables that can hide dirt and other contaminants Raw sprouts, such as alfalfa sprouts Raw or undercooked beef, especially ground beef, or other raw or undercooked meat and poultry Fatty, fried, or spicy foods immediately before or after treatment.  These can sit heavy on your stomach and make you feel nauseous. Raw or undercooked shellfish, such as oysters. Sushi and sashimi, which often contain raw fish.  Unpasteurized beverages, such as unpasteurized fruit juices, raw milk, raw yogurt, or cider Undercooked eggs, such as soft boiled, over easy, and poached; raw, unpasteurized eggs; or foods made with raw egg, such as homemade raw  cookie dough and homemade mayonnaise  Simple steps for food safety  Shop smart. Do not buy food  stored or displayed in an unclean area. Do not buy bruised or damaged fruits or vegetables. Do not buy cans that have cracks, dents, or bulges. Pick up foods that can spoil at the end of your shopping trip and store them in a cooler on the way home.  Prepare and clean up foods carefully. Rinse all fresh fruits and vegetables under running water, and dry them with a clean towel or paper towel. Clean the top of cans before opening them. After preparing food, wash your hands for 20 seconds with hot water and soap. Pay special attention to areas between fingers and under nails. Clean your utensils and dishes with hot water and soap. Disinfect your kitchen and cutting boards using 1 teaspoon of liquid, unscented bleach mixed into 1 quart of water.    Dispose of old food. Eat canned and packaged food before its expiration date (the "use by" or "best before" date). Consume refrigerated leftovers within 3 to 4 days. After that time, throw out the food. Even if the food does not smell or look spoiled, it still may be unsafe. Some bacteria, such as Listeria, can grow even on foods stored in the refrigerator if they are kept for too long.  Take precautions when eating out. At restaurants, avoid buffets and salad bars where food sits out for a long time and comes in contact with many people. Food can become contaminated when someone with a virus, often a norovirus, or another "bug" handles it. Put any leftover food in a "to-go" container yourself, rather than having the server do it. And, refrigerate leftovers as soon as you get home. Choose restaurants that are clean and that are willing to prepare your food as you order it cooked.   AT HOME MEDICATIONS:                                                                                                                                                                 Compazine/Prochlorperazine 10mg  tablet. Take 1 tablet every 6 hours as needed for nausea/vomiting. (This can make you sleepy)   EMLA cream. Apply a quarter size amount to port site 1 hour prior to chemo. Do not rub in. Cover with plastic wrap.    Diarrhea Sheet   If you are having loose stools/diarrhea, please purchase Imodium and begin taking as outlined:  At the first sign of poorly formed or loose stools you should begin taking Imodium (loperamide) 2 mg capsules.  Take two tablets (4mg ) followed by one tablet (2mg ) every 2 hours - DO NOT EXCEED 8 tablets in 24 hours.  If it is bedtime and you are having loose stools, take 2 tablets at bedtime, then 2 tablets every 4 hours until morning.   Always call the [place]  if you are having loose stools/diarrhea that you can't get under control.  Loose stools/diarrhea leads to dehydration (loss of water) in your body.  We have other options of trying to get the loose stools/diarrhea to stop but you must let us know!   Constipation Sheet  Colace - 100 mg capsules - take 2 capsules daily.  If this doesn't help then you can increase to 2 capsules twice daily.  Please call if the above does not work for you. Do not go more than 2 days without a bowel movement.  It is very important that you do not become constipated.  It will make you feel sick to your stomach (nausea) and can cause abdominal pain and vomiting.  Nausea Sheet   Compazine/Prochlorperazine 10mg  tablet. Take 1 tablet every 6 hours as needed for nausea/vomiting (This can make you drowsy).  If you are having persistent nausea (nausea that does not stop) please call the [place] and let us know the amount of nausea that you are experiencing.  If you begin to vomit, you need to call the [facility] and if it is the weekend and you have vomited more than one time and can't get it to stop-go to the Emergency Room.  Persistent nausea/vomiting can lead to dehydration (loss of fluid  in your body) and will make you feel very weak and unwell. Ice chips, sips of clear liquids, foods that are at room temperature, crackers, and toast tend to be better tolerated.   SYMPTOMS TO REPORT AS SOON AS POSSIBLE AFTER TREATMENT:  FEVER GREATER THAN 100.4 F  CHILLS WITH OR WITHOUT FEVER  NAUSEA AND VOMITING THAT IS NOT CONTROLLED WITH YOUR NAUSEA MEDICATION  UNUSUAL SHORTNESS OF BREATH  UNUSUAL BRUISING OR BLEEDING  TENDERNESS IN MOUTH AND THROAT WITH OR WITHOUT PRESENCE OF ULCERS  URINARY PROBLEMS  BOWEL PROBLEMS  UNUSUAL RASH      Wear comfortable clothing and clothing appropriate for easy access to any Portacath or PICC line. Let us know if there is anything that we can do to make your therapy better!    What to do if you need assistance after hours or on the weekends: CALL [phone].  HOLD on the line, do not hang up.  You will hear multiple messages but at the end you will be connected with a nurse triage line.  They will contact the doctor if necessary.  Most of the time they will be able to assist you.  Do not call the hospital operator.      I have been informed and understand all of the instructions given to me and have received a copy. I have been instructed to call the clinic [phone] or my family physician as soon as possible for continued medical care, if indicated. I do not have any more questions at this time but understand that I may call the [place] or the Patient Navigator at [phone] during office hours should I have questions or need assistance in obtaining follow-up care.

## 2023-12-09 ENCOUNTER — Other Ambulatory Visit: Payer: Self-pay

## 2023-12-09 ENCOUNTER — Ambulatory Visit (HOSPITAL_COMMUNITY)
Admission: RE | Admit: 2023-12-09 | Discharge: 2023-12-09 | Disposition: A | Discharge status: Home / Self Care | Source: Ambulatory Visit | Attending: Hematology | Admitting: Hematology

## 2023-12-09 ENCOUNTER — Ambulatory Visit: Payer: Medicare Other | Admitting: Internal Medicine

## 2023-12-09 ENCOUNTER — Inpatient Hospital Stay

## 2023-12-09 VITALS — BP 115/83 | HR 65 | Temp 98.1°F | Resp 20 | Ht 70.0 in | Wt 210.0 lb

## 2023-12-09 DIAGNOSIS — I10 Essential (primary) hypertension: Secondary | ICD-10-CM | POA: Diagnosis not present

## 2023-12-09 DIAGNOSIS — Z902 Acquired absence of lung [part of]: Secondary | ICD-10-CM | POA: Diagnosis not present

## 2023-12-09 DIAGNOSIS — I480 Paroxysmal atrial fibrillation: Secondary | ICD-10-CM

## 2023-12-09 DIAGNOSIS — Z87891 Personal history of nicotine dependence: Secondary | ICD-10-CM | POA: Diagnosis not present

## 2023-12-09 DIAGNOSIS — Z510 Encounter for antineoplastic radiation therapy: Secondary | ICD-10-CM | POA: Diagnosis not present

## 2023-12-09 DIAGNOSIS — C3491 Malignant neoplasm of unspecified part of right bronchus or lung: Secondary | ICD-10-CM | POA: Diagnosis not present

## 2023-12-09 DIAGNOSIS — Z8673 Personal history of transient ischemic attack (TIA), and cerebral infarction without residual deficits: Secondary | ICD-10-CM | POA: Diagnosis not present

## 2023-12-09 DIAGNOSIS — Z7901 Long term (current) use of anticoagulants: Secondary | ICD-10-CM | POA: Insufficient documentation

## 2023-12-09 DIAGNOSIS — E785 Hyperlipidemia, unspecified: Secondary | ICD-10-CM | POA: Diagnosis not present

## 2023-12-09 DIAGNOSIS — C771 Secondary and unspecified malignant neoplasm of intrathoracic lymph nodes: Secondary | ICD-10-CM | POA: Diagnosis not present

## 2023-12-09 DIAGNOSIS — C3490 Malignant neoplasm of unspecified part of unspecified bronchus or lung: Secondary | ICD-10-CM | POA: Diagnosis not present

## 2023-12-09 DIAGNOSIS — C3411 Malignant neoplasm of upper lobe, right bronchus or lung: Secondary | ICD-10-CM | POA: Diagnosis not present

## 2023-12-09 HISTORY — PX: IR IMAGING GUIDED PORT INSERTION: IMG5740

## 2023-12-09 LAB — PROTIME-INR
INR: 2.6 — ABNORMAL HIGH (ref 0.8–1.2)
Prothrombin Time: 28 s — ABNORMAL HIGH (ref 11.4–15.2)

## 2023-12-09 MED ORDER — MIDAZOLAM HCL 2 MG/2ML IJ SOLN
INTRAMUSCULAR | Status: DC | PRN
  Administered 2023-12-09 (×2): .5 mg via INTRAVENOUS

## 2023-12-09 MED ORDER — MIDAZOLAM HCL 2 MG/2ML IJ SOLN
INTRAMUSCULAR | Status: AC
  Filled 2023-12-09: qty 2

## 2023-12-09 MED ORDER — LIDOCAINE-EPINEPHRINE 1 %-1:100000 IJ SOLN
INTRAMUSCULAR | Status: AC
  Filled 2023-12-09: qty 1

## 2023-12-09 MED ORDER — HEPARIN SOD (PORK) LOCK FLUSH 100 UNIT/ML IV SOLN
INTRAVENOUS | Status: AC
  Filled 2023-12-09: qty 5

## 2023-12-09 MED ORDER — FENTANYL CITRATE (PF) 100 MCG/2ML IJ SOLN
INTRAMUSCULAR | Status: DC | PRN
  Administered 2023-12-09 (×2): 25 ug via INTRAVENOUS

## 2023-12-09 MED ORDER — FENTANYL CITRATE (PF) 100 MCG/2ML IJ SOLN
INTRAMUSCULAR | Status: AC
  Filled 2023-12-09: qty 2

## 2023-12-09 NOTE — H&P (Signed)
 Chief Complaint: Patient was seen in consultation today for adenocarcinoma of right lung   Procedure: Port-a-catheter placement   Referring Physician(s): Katragadda,Sreedhar  Supervising Physician: Ruel Favors  Patient Status: [facility] - Out-pt  History of Present Illness: Randall Sullivan is a 73 y.o. male with a history of stroke, COPD, and stage 1A non-small cell lung cancer s/p left VATS, left upper lobectomy with mediastinal lymph node dissection in 2017 and s/p right VATS with wedge resection of right upper lobe nodule and bleb resection in 2020. 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 Admits to experiencing significant shortness of breath, but attributes it to COPD and prior lung surgery. Currently on home oxygen. Also with fatigue, weakness and vision changes due to prior stroke.   Code Status: Full Code  Past Medical History:  Diagnosis Date   A-fib (HCC) 2017   after last lung surgery patient had a history if Afib documented in hospital    AAA (abdominal aortic aneurysm) (HCC)    4.0 cm 09/17/22 [place]   Acute appendicitis 07/14/2021   Arthritis    back & legs ( knees)   Cancer (HCC)    lung   Complication of anesthesia    agitated upon waking from anesth.    Dyspnea    Dysrhythmia    A. Fib   Emphysema    Emphysema of lung (HCC)    History of kidney stones    Hyperlipidemia    Hypertension    Oxygen dependent    2L continuous   Peripheral vascular disease (HCC)    Aortic Aneurysm   Pneumonia    Pre-diabetes    Sleep apnea    No OSA, but wears 2L [place] O2 continuous including at night   Stroke [facility]) 2022   Right sided weakness, decreased hearing, balance and memory loss   Tobacco abuse     Past Surgical History:  Procedure Laterality Date   HERNIA REPAIR Bilateral 1999   umbilical   LAPAROSCOPIC APPENDECTOMY N/A 07/15/2021   Procedure: APPENDECTOMY LAPAROSCOPIC;  Surgeon: Lucretia Roers, MD;  Location: [facility] ORS;  Service: General;  Laterality: N/A;   VIDEO ASSISTED THORACOSCOPY Right 12/21/2018   Procedure: VIDEO ASSISTED THORACOSCOPY AND RESECTION OF BLEBS RIGHT LOWER LOBE;  Surgeon: Loreli Slot, MD;  Location: [facility] OR;  Service: Thoracic;  Laterality: Right;   VIDEO ASSISTED THORACOSCOPY (VATS)/ LOBECTOMY Left 08/01/2016   Procedure: VIDEO ASSISTED THORACOSCOPY (VATS)/ LEFT UPPER LOBECTOMY with lymph node sampling and onQ placement;  Surgeon: Loreli Slot, MD;  Location: [facility] OR;  Service: Thoracic;  Laterality: Left;   VIDEO ASSISTED THORACOSCOPY (VATS)/WEDGE RESECTION Right 12/09/2018   Procedure: VIDEO ASSISTED THORACOSCOPY (VATS)/WEDGE RESECTION;  Surgeon: Loreli Slot, MD;  Location: [facility] OR;  Service: Thoracic;  Laterality: Right;   VIDEO BRONCHOSCOPY Bilateral 12/21/2018   Procedure: VIDEO BRONCHOSCOPY;  Surgeon: Loreli Slot, MD;  Location: [facility] OR;  Service: Thoracic;  Laterality: Bilateral;   VIDEO BRONCHOSCOPY WITH ENDOBRONCHIAL NAVIGATION N/A 07/03/2016   Procedure: VIDEO BRONCHOSCOPY WITH ENDOBRONCHIAL NAVIGATION;  Surgeon: Loreli Slot, MD;  Location: [facility] OR;  Service: Thoracic;  Laterality: N/A;   VIDEO BRONCHOSCOPY WITH ENDOBRONCHIAL NAVIGATION N/A 10/24/2023   Procedure: VIDEO BRONCHOSCOPY WITH ENDOBRONCHIAL NAVIGATION;  Surgeon: Loreli Slot, MD;  Location: [facility] OR;  Service: Thoracic;  Laterality: N/A;   VIDEO BRONCHOSCOPY WITH ENDOBRONCHIAL ULTRASOUND N/A 10/24/2023   Procedure: VIDEO BRONCHOSCOPY WITH ENDOBRONCHIAL ULTRASOUND;  Surgeon:  Loreli Slot, MD;  Location: [facility] OR;  Service: Thoracic;  Laterality: N/A;   VIDEO BRONCHOSCOPY WITH INSERTION OF INTERBRONCHIAL VALVE (IBV) N/A 12/11/2018   Procedure: VIDEO BRONCHOSCOPY WITH INSERTION OF INTERBRONCHIAL VALVE (IBV);  Surgeon: Loreli Slot, MD;  Location: [facility] OR;  Service: Thoracic;  Laterality: N/A;   VIDEO BRONCHOSCOPY WITH INSERTION OF  INTERBRONCHIAL VALVE (IBV) N/A 02/10/2019   Procedure: VIDEO BRONCHOSCOPY WITH REMOVAL OF INTERBRONCHIAL VALVE (IBV);  Surgeon: Loreli Slot, MD;  Location: [facility] OR;  Service: Thoracic;  Laterality: N/A;    Allergies: Patient has no known allergies.  Medications: Prior to Admission medications   Medication Sig Start Date End Date Taking? Authorizing Provider  acetaminophen (TYLENOL) 325 MG tablet Take 2 tablets (650 mg total) by mouth every 6 (six) hours as needed for mild pain (or Fever >/= 101). 03/22/21   Angiulli, Mcarthur Rossetti, PA-C  atorvastatin (LIPITOR) 80 MG tablet Take 80 mg by mouth daily.    [provider]  Budeson-Glycopyrrol-Formoterol (BREZTRI AEROSPHERE) 160-9-4.8 MCG/ACT AERO Inhale 2 puffs into the lungs in the morning and at bedtime.    [provider]  cetirizine (ZYRTEC) 10 MG tablet Take 10 mg by mouth daily.    [provider]  Cholecalciferol (VITAMIN D3) 125 MCG (5000 UT) CAPS Take 1 capsule (5,000 Units total) by mouth daily. 03/22/21   Angiulli, Mcarthur Rossetti, PA-C  diltiazem (CARDIZEM CD) 180 MG 24 hr capsule Take 1 capsule (180 mg total) by mouth daily. 09/09/23   Anabel Halon, MD  fenofibrate (TRICOR) 145 MG tablet Take 1 tablet (145 mg total) by mouth daily. 06/30/23   Anabel Halon, MD  finasteride (PROSCAR) 5 MG tablet Take 1 tablet (5 mg total) by mouth daily. 11/03/23   Anabel Halon, MD  guaiFENesin (MUCINEX) 600 MG 12 hr tablet Take by mouth 2 (two) times daily.    [provider]  ipratropium-albuterol (DUONEB) 0.5-2.5 (3) MG/3ML SOLN USE 1 AMPULE IN NEBULIZER 4 TIMES DAILY AND AS NEEDED FOR SHORTNESS OF BREATH AND FOR WHEEZING 09/29/23   Anabel Halon, MD  meclizine (ANTIVERT) 25 MG tablet Take 1 tablet (25 mg total) by mouth 3 (three) times daily as needed for dizziness. 09/16/23   Vassie Loll, MD  metoprolol succinate (TOPROL XL) 50 MG 24 hr tablet Take 1 tablet (50 mg total) by mouth daily. Take with or  immediately following a meal. 09/16/23 09/15/24  Vassie Loll, MD  Omega-3 Fatty Acids (FISH OIL ULTRA PO) Take 1,200 mg by mouth in the morning and at bedtime.    [provider]  OXYGEN Inhale 2 L into the lungs continuous. Hx of lung ca, copd, and emphysema    [provider]  Palonosetron HCl (ALOXI IV) Inject into the vein.    [provider]  pantoprazole (PROTONIX) 40 MG tablet Take 1 tablet (40 mg total) by mouth daily. 11/14/23 11/13/24  Anabel Halon, MD  polyethylene glycol (MIRALAX / GLYCOLAX) 17 g packet Take 17 g by mouth daily as needed for mild constipation. 09/16/23   Vassie Loll, MD  rosuvastatin (CRESTOR) 40 MG tablet Take 1 tablet (40 mg total) by mouth daily. 09/03/23   Rollene Rotunda, MD  tamsulosin (FLOMAX) 0.4 MG CAPS capsule Take 2 capsules (0.8 mg total) by mouth daily. 10/27/23   Anabel Halon, MD  warfarin (COUMADIN) 5 MG tablet Take 1 tablet (5 mg total) by mouth daily. Patient taking differently: Take 2.5-5 mg by mouth See  admin instructions. Take 2.5 mg by mouth Tuesday and Friday and take 5 mg on Monday, Wednesday, Thursday, Saturday and Sunday 03/31/23   Anabel Halon, MD     Family History  Problem Relation Age of Onset   Cancer Mother    Cancer Sister    Aneurysm Sister    Kidney disease Paternal Aunt    Asthma Brother    Cancer - Lung Brother    Cancer Brother    Arthritis Brother     Social History   Socioeconomic History   Marital status: Married    Spouse name: Not on file   Number of children: 2   Years of education: Not on file   Highest education level: 9th grade  Occupational History   Occupation: Vinyl siding   Occupation: Retired  Tobacco Use   Smoking status: Former    Current packs/day: 0.00    Average packs/day: 1 pack/day for 40.0 years (40.0 ttl pk-yrs)    Types: Cigarettes    Start date: 05/28/1976    Quit date: 05/28/2016    Years since quitting: 7.5   Smokeless tobacco: Never  Vaping Use    Vaping status: Never Used  Substance and Sexual Activity   Alcohol use: Not Currently    Comment: quit- 2016   Drug use: No   Sexual activity: Not Currently  Other Topics Concern   Not on file  Social History Narrative   Married for 20 years,second.Lives with wife. 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   Social Drivers of Health   Financial Resource Strain: Patient Declined (07/11/2023)   Overall Financial Resource Strain (CARDIA)    Difficulty of Paying Living Expenses: Patient declined  Food Insecurity: No Food Insecurity (09/17/2023)   Hunger Vital Sign    Worried About Running Out of Food in the Last Year: Never true    Ran Out of Food in the Last Year: Never true  Transportation Needs: No Transportation Needs (09/17/2023)   PRAPARE - Administrator, Civil Service (Medical): No    Lack of Transportation (Non-Medical): No  Physical Activity: Insufficiently Active (07/11/2023)   Exercise Vital Sign    Days of Exercise per Week: 3 days    Minutes of Exercise per Session: 10 min  Stress: Patient Declined (07/11/2023)   [employer] of Occupational Health - Occupational Stress Questionnaire    Feeling of Stress : Patient declined  Social Connections: Unknown (07/11/2023)   Social Connection and Isolation Panel [NHANES]    Frequency of Communication with Friends and Family: Twice a week    Frequency of Social Gatherings with Friends and Family: Once a week    Attends Religious Services: Patient declined    Database administrator or Organizations: No    Attends Banker Meetings: Never    Marital Status: Married    Review of Systems  Constitutional:  Positive for appetite change and fatigue. Negative for fever.  Respiratory:  Positive for shortness of breath (wears supplemental oxygen at home). Negative for cough.   Cardiovascular:  Negative for chest pain.  Gastrointestinal:  Negative for abdominal pain, nausea and vomiting.  Neurological:   Negative for dizziness and headaches.    Vital Signs: BP (!) 143/73   Pulse 60   Temp 98.1 F (36.7 C) (Oral)   Resp (!) 21   Ht 5\' 10"  (1.778 m)   Wt 210 lb (95.3 kg)   SpO2 99%   BMI 30.13 kg/m    Physical Exam Vitals  reviewed.  Constitutional:      General: He is not in acute distress. HENT:     Head: Normocephalic.  Cardiovascular:     Rate and Rhythm: Normal rate and regular rhythm.  Pulmonary:     Effort: Pulmonary effort is normal.     Comments: Decreased breath sounds bilaterally (+) supplemental oxygen via [place] Abdominal:     General: There is no distension.     Palpations: Abdomen is soft.     Tenderness: There is no abdominal tenderness.  Skin:    General: Skin is warm and dry.  Neurological:     Mental Status: He is alert and oriented to person, place, and time.  Psychiatric:        Mood and Affect: Mood normal.        Behavior: Behavior normal.        Thought Content: Thought content normal.        Judgment: Judgment normal.     Imaging: MR BRAIN WO CONTRAST Result Date: 12/02/2023 CLINICAL DATA:  73 year old male with non-small cell lung cancer. Possible recurrence in the chest in November. A study without and with contrast was planned but the patient declined postcontrast imaging, declined IV contrast injection. Only noncontrast images are provided. EXAM: MRI HEAD WITHOUT CONTRAST TECHNIQUE: Multiplanar, multiecho pulse sequences of the brain and surrounding structures were obtained without intravenous contrast. 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 Subsequent encephalomalacia including involvement of the mesial temporal lobe, both thalami. Mild associated hemosiderin on SWI which results in DWI artifact. Some generalized cerebral volume loss since 2022. ex vacuo appearing ventricular enlargement. No restricted diffusion or evidence of acute  infarction. No midline shift, mass effect, or evidence of intracranial mass lesion. Chronic but increased periventricular white matter T2 and FLAIR hyperintensity since 2022. No other cortical encephalomalacia or chronic cerebral blood products identified. Brainstem and cerebellum remain negative. Vascular: Major intracranial vascular flow voids are stable since 2022. Dominant distal left vertebral artery suspected. Skull and upper cervical spine: Sagittal T1 weighted imaging degraded by motion artifact. Visible bone marrow signal is within normal limits. Sinuses/Orbits: Stable, negative. Other: Mastoids are clear. Grossly normal visible internal auditory structures. IMPRESSION: 1. The patient declined IV contrast injection. No strong evidence of cerebral metastatic disease on this noncontrast exam. 2. 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 Encephalomalacia and mild hemosiderin. No acute intracranial abnormality identified. Electronically Signed   By: Odessa Fleming M.D.   On: 12/02/2023 09:21    Labs:  CBC: Recent Labs    09/14/23 0936 09/15/23 0423 10/21/23 1310 11/26/23 1040  WBC 8.7 12.8* 6.7 6.9  HGB 15.1 13.6 13.1 13.6  HCT 47.5 43.9 42.9 44.3  PLT 220 205 330 197    COAGS: Recent Labs    09/16/23 0425 10/21/23 1310 10/24/23 0957 11/25/23 0821  INR 3.1* 1.8* 1.2 3.3*  APTT  --  38*  --   --     BMP: Recent Labs    09/14/23 0936 09/15/23 0423 10/21/23 1310 11/26/23 1040  NA 137 136 137 139  K 4.3 4.5 4.5 4.4  CL 101 100 100 101  CO2 28 23 27  34*  GLUCOSE 122* 242* 91 85  BUN 14 28* 14 18  CALCIUM 9.7 9.3 9.4 9.4  CREATININE 1.14 1.28* 1.37* 1.10  GFRNONAA >60 59* 54* >60    LIVER FUNCTION TESTS: Recent Labs  03/04/23 1515 10/21/23 1310 11/26/23 1040  BILITOT 0.4 1.5* 0.7  AST 22 26 20   ALT 26 22 19   ALKPHOS 45 38 34*  PROT 6.9 7.1 7.2  ALBUMIN 4.6 3.8 4.5    TUMOR MARKERS: No results for input(s): "AFPTM", "CEA", "CA199",  "CHROMGRNA" in the last 8760 hours.  Assessment and Plan:  Stage III adenocarcinoma of the right lung: Randall Sullivan is a 73 y.o. male with a history of stroke, COPD, and adenocarcinoma of the right lung who presents to [facility] Interventional Radiology department for an image-guided port-a-catheter placement.  Risks and benefits of image-guided Port-a-catheter placement were discussed with the patient including, but not limited to bleeding, infection, pneumothorax, or fibrin sheath development and need for additional procedures.  All of the patient's questions were answered, patient is agreeable to proceed.  Consent signed and in chart.  Thank you for this interesting consult. I greatly enjoyed meeting JACORY KAMEL and look forward to participating in their care. A copy of this report was sent to the requesting provider on this date.  Electronically Signed: Villa Herb, PA-C 12/09/2023, 12:49 PM   I spent a total of  30 Minutes in face to face clinical consultation, greater than 50% of which was counseling/coordinating care for lung cancer.

## 2023-12-09 NOTE — Procedures (Signed)
Interventional Radiology Procedure Note  Procedure: RT internal jugular POWER PORT    Complications: None  Estimated Blood Loss:  MIN  Findings: TIP SVCRA    M. TREVOR Levaeh Vice, MD    

## 2023-12-10 ENCOUNTER — Inpatient Hospital Stay: Admitting: Licensed Clinical Social Worker

## 2023-12-10 DIAGNOSIS — C3491 Malignant neoplasm of unspecified part of right bronchus or lung: Secondary | ICD-10-CM

## 2023-12-10 NOTE — Progress Notes (Signed)
 [facility] Clinical Social Work  Initial Assessment   Randall Sullivan is a 73 y.o. year old male contacted caregiver by phone.  Pt's wife provided all information for assessment. Clinical Social Work was referred by medical provider for assessment of psychosocial needs.   SDOH (Social Determinants of Health) assessments performed: Yes SDOH Interventions    Flowsheet Row Telephone from 09/17/2023 in [place]PART[facility] Clinical Support from 07/31/2021 in [facility]  SDOH Interventions    Food Insecurity Interventions Intervention Not Indicated --  Housing Interventions Intervention Not Indicated Intervention Not Indicated  Transportation Interventions Intervention Not Indicated Intervention Not Indicated  Utilities Interventions Intervention Not Indicated --  Physical Activity Interventions -- Patient Refused  Social Connections Interventions -- Patient Refused  Tobacco Interventions -- Patient Refused       SDOH Screenings   Food Insecurity: No Food Insecurity (09/17/2023)  Housing: Low Risk  (09/17/2023)  Transportation Needs: No Transportation Needs (09/17/2023)  Utilities: Not At Risk (09/17/2023)  Depression (PHQ2-9): Low Risk  (09/26/2023)  Financial Resource Strain: Patient Declined (07/11/2023)  Physical Activity: Insufficiently Active (07/11/2023)  Social Connections: Unknown (07/11/2023)  Stress: Patient Declined (07/11/2023)  Tobacco Use: Medium Risk (12/04/2023)   Received from [facility]     Distress Screen completed: Yes    11/12/2018    3:00 PM  ONCBCN DISTRESS SCREENING  Screening Type Change in Status  Distress experienced in past week (1-10) 0      Family/Social Information:  Housing Arrangement: patient lives with his wife, Randall Sullivan.  Randall Sullivan works full time at [employer]. Family members/support persons in your life? Pt has a daughter who resides in [place].  Pt's daughter assists as she is able, but is employed and  limited in the support she can provide. 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  Income source: Actor concerns: Yes, current concerns Type of concern: Utilities Food access concerns: no Religious or spiritual practice: Yes-[employer] Advanced directives: Not known Services Currently in place:  none  Coping/ Adjustment to diagnosis: Patient understands treatment plan and what happens next? yes Concerns about diagnosis and/or treatment: Quality of life Patient reported stressors: Finances and Adjusting to my illness Hopes and/or priorities: pt's priority is to start treatment w/ hopes of positive results Patient enjoys  not addressed Current coping skills/ strengths: Motivation for treatment/growth  and Physical Health     SUMMARY: Current SDOH Barriers:  Financial constraints related to fixed income  Clinical Social Work Clinical Goal(s):  Explore community resource options for unmet needs related to:  Financial Strain   Interventions: Discussed common feeling and emotions when being diagnosed with cancer, and the importance of support during treatment Informed patient of the support team roles and support services at [facility] Provided CSW contact information and encouraged patient to call with any questions or concerns Referred patient to Marijean Niemann for financial support.  Pt does not at this time receive food stamps and is not eligible for the [employer].  CSW encouraged pt's wife to apply for food stamps if her income is significantly impacted from missing work to accommodate treatment.     Follow Up Plan: Patient will contact CSW with any support or resource needs Patient verbalizes understanding of plan: Yes    Rachel Moulds, LCSW Clinical Social Worker [facility]

## 2023-12-11 MED ORDER — PROCHLORPERAZINE MALEATE 10 MG PO TABS: 10.0000 mg | ORAL_TABLET | Freq: Four times a day (QID) | ORAL | 3 refills | Status: DC | PRN

## 2023-12-11 MED ORDER — LIDOCAINE-PRILOCAINE 2.5-2.5 % EX CREA: TOPICAL_CREAM | CUTANEOUS | 3 refills | Status: DC

## 2023-12-13 ENCOUNTER — Encounter: Payer: Self-pay | Admitting: Internal Medicine

## 2023-12-15 ENCOUNTER — Other Ambulatory Visit: Payer: Self-pay

## 2023-12-15 DIAGNOSIS — J449 Chronic obstructive pulmonary disease, unspecified: Secondary | ICD-10-CM

## 2023-12-15 MED ORDER — IPRATROPIUM-ALBUTEROL 0.5-2.5 (3) MG/3ML IN SOLN: RESPIRATORY_TRACT | 0 refills | Status: DC

## 2023-12-16 ENCOUNTER — Other Ambulatory Visit (HOSPITAL_COMMUNITY): Payer: Self-pay

## 2023-12-16 ENCOUNTER — Ambulatory Visit (HOSPITAL_COMMUNITY)
Admission: RE | Admit: 2023-12-16 | Discharge: 2023-12-16 | Disposition: A | Discharge status: Home / Self Care | Source: Ambulatory Visit | Attending: Hematology | Admitting: Hematology

## 2023-12-16 ENCOUNTER — Ambulatory Visit (INDEPENDENT_AMBULATORY_CARE_PROVIDER_SITE_OTHER): Payer: Self-pay | Admitting: *Deleted

## 2023-12-16 ENCOUNTER — Ambulatory Visit: Payer: Medicare Other

## 2023-12-16 ENCOUNTER — Telehealth: Payer: Self-pay | Admitting: Cardiology

## 2023-12-16 ENCOUNTER — Encounter: Payer: Self-pay | Admitting: Hematology

## 2023-12-16 ENCOUNTER — Inpatient Hospital Stay

## 2023-12-16 DIAGNOSIS — C3490 Malignant neoplasm of unspecified part of unspecified bronchus or lung: Secondary | ICD-10-CM | POA: Insufficient documentation

## 2023-12-16 DIAGNOSIS — I10 Essential (primary) hypertension: Secondary | ICD-10-CM | POA: Diagnosis not present

## 2023-12-16 DIAGNOSIS — Z5111 Encounter for antineoplastic chemotherapy: Secondary | ICD-10-CM | POA: Diagnosis not present

## 2023-12-16 DIAGNOSIS — G9389 Other specified disorders of brain: Secondary | ICD-10-CM | POA: Diagnosis not present

## 2023-12-16 DIAGNOSIS — Z510 Encounter for antineoplastic radiation therapy: Secondary | ICD-10-CM | POA: Diagnosis not present

## 2023-12-16 DIAGNOSIS — C3491 Malignant neoplasm of unspecified part of right bronchus or lung: Secondary | ICD-10-CM | POA: Insufficient documentation

## 2023-12-16 DIAGNOSIS — I714 Abdominal aortic aneurysm, without rupture, unspecified: Secondary | ICD-10-CM | POA: Diagnosis not present

## 2023-12-16 DIAGNOSIS — C3411 Malignant neoplasm of upper lobe, right bronchus or lung: Secondary | ICD-10-CM | POA: Diagnosis not present

## 2023-12-16 DIAGNOSIS — I6782 Cerebral ischemia: Secondary | ICD-10-CM | POA: Diagnosis not present

## 2023-12-16 DIAGNOSIS — C770 Secondary and unspecified malignant neoplasm of lymph nodes of head, face and neck: Secondary | ICD-10-CM | POA: Diagnosis not present

## 2023-12-16 DIAGNOSIS — Z8673 Personal history of transient ischemic attack (TIA), and cerebral infarction without residual deficits: Secondary | ICD-10-CM | POA: Diagnosis not present

## 2023-12-16 DIAGNOSIS — C771 Secondary and unspecified malignant neoplasm of intrathoracic lymph nodes: Secondary | ICD-10-CM | POA: Diagnosis not present

## 2023-12-16 DIAGNOSIS — I4891 Unspecified atrial fibrillation: Secondary | ICD-10-CM | POA: Diagnosis not present

## 2023-12-16 DIAGNOSIS — E785 Hyperlipidemia, unspecified: Secondary | ICD-10-CM | POA: Diagnosis not present

## 2023-12-16 MED ORDER — GADOBUTROL 1 MMOL/ML IV SOLN
10.0000 mL | Freq: Once | INTRAVENOUS | Status: AC | PRN
  Administered 2023-12-16: 10 mL via INTRAVENOUS

## 2023-12-16 NOTE — Patient Instructions (Signed)
 3/18:  Pt canceled appt for today.  LM for wife for pt to continue warfarin 1 tablet daily except 1/2 tablet on Tuesdays and Fridays Continue greens/salads  Recheck INR in 3 weeks.  Will be staring chemo and radiation

## 2023-12-16 NOTE — Telephone Encounter (Signed)
 Returned wife's call.  NA.  LM to call back.  PT's INR on 12/09/23 at [facility] was 2.6.  Pt to continue current dose of warfarin.  Will reschedule INR appt when wife calls back.  Pt is starting Chemo and Radiation for lung Cancer.

## 2023-12-16 NOTE — Telephone Encounter (Signed)
 Tried calling pharamacy twice to ask what is needed unable to get through

## 2023-12-16 NOTE — Telephone Encounter (Signed)
 Wife cx INR appt this morning because pt had to go to [facility] for a Chemo Class. Wife also states INR was checked 12/09/23 by the cancer center. I told her [name] would look at the range and call her back to let her know when he needs to come in.

## 2023-12-16 NOTE — Progress Notes (Signed)

## 2023-12-17 NOTE — Progress Notes (Signed)
 Discontinue diphenhydramine from oncology treatment plan --> Add Quzyttir (cetirizine) 10 mg IVPush x 1 as premedication for oncology treatment plan.  T.O. Dr Carilyn Goodpasture, PharmD

## 2023-12-17 NOTE — Progress Notes (Signed)
 Pharmacist Chemotherapy Monitoring - Initial Assessment    Anticipated start date: 12/23/23   The following has been reviewed per standard work regarding the patient's treatment regimen: The patient's diagnosis, treatment plan and drug doses, and organ/hematologic function Lab orders and baseline tests specific to treatment regimen  The treatment plan start date, drug sequencing, and pre-medications Prior authorization status  Patient's documented medication list, including drug-drug interaction screen and prescriptions for anti-emetics and supportive care specific to the treatment regimen The drug concentrations, fluid compatibility, administration routes, and timing of the medications to be used The patient's access for treatment and lifetime cumulative dose history, if applicable  The patient's medication allergies and previous infusion related reactions, if applicable   Changes made to treatment plan:  pre-medications dc diphenhydramine change to cetirizine 10 mg IV x 1  Follow up needed:  N/A   Stephens Shire, [facility], 12/17/2023  10:50 AM

## 2023-12-18 DIAGNOSIS — C3411 Malignant neoplasm of upper lobe, right bronchus or lung: Secondary | ICD-10-CM | POA: Diagnosis not present

## 2023-12-18 DIAGNOSIS — C771 Secondary and unspecified malignant neoplasm of intrathoracic lymph nodes: Secondary | ICD-10-CM | POA: Diagnosis not present

## 2023-12-18 DIAGNOSIS — I10 Essential (primary) hypertension: Secondary | ICD-10-CM | POA: Diagnosis not present

## 2023-12-18 DIAGNOSIS — Z510 Encounter for antineoplastic radiation therapy: Secondary | ICD-10-CM | POA: Diagnosis not present

## 2023-12-18 DIAGNOSIS — E785 Hyperlipidemia, unspecified: Secondary | ICD-10-CM | POA: Diagnosis not present

## 2023-12-18 DIAGNOSIS — Z8673 Personal history of transient ischemic attack (TIA), and cerebral infarction without residual deficits: Secondary | ICD-10-CM | POA: Diagnosis not present

## 2023-12-22 ENCOUNTER — Encounter: Payer: Self-pay | Admitting: Internal Medicine

## 2023-12-22 NOTE — Progress Notes (Signed)
 [facility] [address], [place] [address]   Clinic Day:  12/23/2023  Referring physician: Anabel Halon, MD  Patient Care Team: Anabel Halon, MD as PCP - General (Internal Medicine) [name], MD as Medical Oncologist (Medical Oncology) Therese Sarah, RN as Oncology Nurse Navigator (Medical Oncology)   ASSESSMENT & PLAN:   Assessment:  1.  Recurrent adenocarcinoma of the lung: - Stage Ia (T1b N0 M0) adenocarcinoma of the left upper lobe, s/p left VATS, left upper lobectomy with mediastinal lymph node dissection by Dr. Dorris Fetch on 08/01/2016 - Stage Ia moderately differentiated adenocarcinoma, 0.7 cm of the right upper lobe, s/p right VATS with wedge resection of right upper lobe nodule and bleb resection by Dr. Dorris Fetch on 12/09/2018 - PET scan (08/19/2023): Dysregulation artifact degrades image quality.  Recurrent bronchogenic carcinoma in the medial right upper lobe along the resection margin with no evidence of distant metastatic disease.  Faint 6 mm nodule in the superior segment right lower lobe too small for PET resolution.  Focal hypermetabolism in the central prostate. - CT super D chest (10/21/2023): Solid 2.6 cm central right upper lobe pulmonary nodule along the wedge resection suture line.  Solid 1.3 cm and 0.7 cm peripheral right lower lobe lung nodules. - MRI brain (12/02/2023): Without IV contrast with no strong evidence of cerebral metastatic disease. - Right paratracheal lymph node FNA (10/24/2023): Adenocarcinoma - Foundation 1 CDX: Insufficient tissue - PD-L1 22 C3 (TPS): 5% - Chemoradiation therapy with weekly carboplatin and paclitaxel started on 12/23/2023  2.  Social/family history: - He lives at home with his wife Randall Sullivan.  He had a stroke in 2022 which has caused short-term memory loss, loss of right-sided peripheral vision and balance problems.  He currently walks with a cane and sometimes walker.  He is also on  continuous oxygen since hospitalization in December 2024.  Most of the day he spends time sitting in the chair.  He can wash dishes but does require some help with some of the ADLs.  Quit smoking in 2017. - Mother and sister had ovarian cancer.  Half brother had lung cancer.   Plan:  1.  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 - We reviewed MRI of the brain with and without contrast from 12/16/2023: No evidence of metastatic disease. - Reviewed labs today: Normal LFTs and creatinine.  Electrolytes are normal.  CBC was grossly normal. - We discussed chemoradiation therapy with weekly carboplatin and paclitaxel.  We discussed side effects in detail.  He will proceed with first weekly treatment today.  RTC 1 week for follow-up.  2.  Hoarseness: - Reported having hoarseness since December.  He reports the hoarseness is worse in the afternoons. - Recommend ENT evaluation and management.   No orders of the defined types were placed in this encounter.     [name],acting as a Neurosurgeon for [name], MD.,have documented all relevant documentation on the behalf of [name], MD,as directed by  [name], MD while in the presence of [name], MD.  I, [name] MD, have reviewed the above documentation for accuracy and completeness, and I agree with the above.    [name], MD   3/25/20252:01 PM  CHIEF COMPLAINT/PURPOSE OF CONSULT:   Diagnosis: Recurrent stage III adenocarcinoma   Cancer Staging  Primary lung adenocarcinoma (HCC) Staging form: Lung, AJCC 8th Edition - Clinical: Stage IA2 (cT1b, cN0, cM0) - Signed  by [name], MD on 12/04/2020  Recurrent non-small cell lung cancer (NSCLC) (HCC) Staging form: Lung, AJCC V9 - Clinical stage from 12/03/2023: Stage IA3 (rcT1c, cN0, cM0) - Unsigned    Prior Therapy: Left upper lobectomy (2017) and right upper lobectomy  (2020)  Current Therapy: Concurrent chemoradiation therapy   HISTORY OF PRESENT ILLNESS:   Oncology History  Primary lung adenocarcinoma (HCC)  10/15/2016 Initial Diagnosis   Primary lung adenocarcinoma (HCC)   12/04/2020 Cancer Staging   Staging form: Lung, AJCC 8th Edition - Clinical: Stage IA2 (cT1b, cN0, cM0) - Signed by [name], MD on 12/04/2020   Recurrent non-small cell lung cancer (NSCLC) (HCC)  12/03/2023 Initial Diagnosis   Recurrent non-small cell lung cancer (NSCLC) (HCC)   12/23/2023 -  Chemotherapy   Patient is on Treatment Plan : LUNG Carboplatin + Paclitaxel + XRT q7d         Randall Sullivan is a 73 y.o. male presenting to clinic today for evaluation of recurrent adenocarcinoma of right lung, non small cell type at the request of Dr. Arbutus Ped.  Randall Sullivan was first diagnosed in 2017 with Stage IA (T1b, N0, M0) non-small cell lung cancer, well-differentiated adenocarcinoma presented with left upper lobe pulmonary nodule. He then underwent left VATS and left upper lobectomy with mediastinal lymph node dissection under Dr. Dorris Fetch on 08/01/2016. Randall Sullivan has since been under surveillance for left lung adenocarcinoma.  He was diagnosed with stage Ia non-small cell lung cancer in March 2020 and underwent right VATS with wedge resection of right upper lobe nodule and bleb resection under Dr. Dorris Fetch on 12/10/2018. He was under observation until a CT scan on 07/08/23 found an increasing mass like soft tissue along the suture margin in the central right upper lobe. PET on 08/19/23 found: recurrent bronchogenic carcinoma in the medial right upper lobe along the resection margin with faint 0.6 cm nodule in the superior segment of the right lower lobe too small for PET resolution.   Randall Sullivan then had a bronchoscopy on 10/24/23 with right paratracheal lymph node biopsy and endobronchial valve replacement in the anterior apical, posterior segments of right upper lobe, and superior segment of right  lower lobe. Pathology of lymph node biopsy showed: adenocarcinoma. Molecular pathology showed a PDL1 score of 5%.   His cancer is now considered Stage III NSCLC adenocarcinoma due to right paratracheal lymph node involvement. Randall Sullivan had discussion of treatment with Dr. Arbutus Ped on 11/26/23 that consisted of weekly mild chemotherapy with adjunct radiation at [facility] with their side effects.   MRI brain done on 12/02/23 showed: No strong evidence of cerebral metastatic disease on this noncontrast exam. 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 Encephalomalacia and mild hemosiderin. No acute intracranial abnormality identified.  Randall Sullivan has a history of stroke, with symptoms of fatigue, weakness, vision changes, and hoarseness. He also has pertinent history of COPD and requires at home oxygen.  Today, he states that he is doing well overall. His appetite level is at 100%. His energy level is at 50%.  INTERVAL HISTORY:   Randall Sullivan is a 74 y.o. male presenting to the clinic today for follow-up of recurrent stage III NSCLC. He was last seen by me on 12/03/23 in consultation.  Since his last visit, he underwent MRI brain on 12/16/23 that found: No evidence of intracranial metastatic disease. Old left PCA territory infarction affecting the posteromedial temporal lobe, occipital lobe and left thalamus. Old small vessel infarction of the right thalamus. Minimal small vessel change  otherwise within the cerebral hemispheric white matter. Mild chronic small-vessel ischemic change of the pons.  Of note, Jahlen had his port inserted on 12/09/23 by IR.   Today, he states that he is doing well overall. His appetite level is at 100%. His energy level is at 50%. Randall Sullivan is accompanied by his wife.   He started radiation therapy today with Dr. Langston Masker. Brenton is prediabetic.   His wife notes Randall Sullivan has had a hoarse voice beginning prior hospital admission on 09/14/23. Hoarseness is worse in the afternoons and  early mornings. He denies any dysphagia.   PAST MEDICAL HISTORY:   Past Medical History: Past Medical History:  Diagnosis Date   A-fib (HCC) 2017   after last lung surgery patient had a history if Afib documented in hospital    AAA (abdominal aortic aneurysm) (HCC)    4.0 cm 09/17/22 [place]   Acute appendicitis 07/14/2021   Arthritis    back & legs ( knees)   Cancer (HCC)    lung   Complication of anesthesia    agitated upon waking from anesth.    Dyspnea    Dysrhythmia    A. Fib   Emphysema    Emphysema of lung (HCC)    History of kidney stones    Hyperlipidemia    Hypertension    Oxygen dependent    2L continuous   Peripheral vascular disease (HCC)    Aortic Aneurysm   Pneumonia    Pre-diabetes    Sleep apnea    No OSA, but wears 2L [place] O2 continuous including at night   Stroke [place]) 2022   Right sided weakness, decreased hearing, balance and memory loss   Tobacco abuse     Surgical History: Past Surgical History:  Procedure Laterality Date   HERNIA REPAIR Bilateral 1999   umbilical   IR IMAGING GUIDED PORT INSERTION  12/09/2023   LAPAROSCOPIC APPENDECTOMY N/A 07/15/2021   Procedure: APPENDECTOMY LAPAROSCOPIC;  Surgeon: Lucretia Roers, MD;  Location: [facility];  Service: General;  Laterality: N/A;   VIDEO ASSISTED THORACOSCOPY Right 12/21/2018   Procedure: VIDEO ASSISTED THORACOSCOPY AND RESECTION OF BLEBS RIGHT LOWER LOBE;  Surgeon: Loreli Slot, MD;  Location: [facility] OR;  Service: Thoracic;  Laterality: Right;   VIDEO ASSISTED THORACOSCOPY (VATS)/ LOBECTOMY Left 08/01/2016   Procedure: VIDEO ASSISTED THORACOSCOPY (VATS)/ LEFT UPPER LOBECTOMY with lymph node sampling and onQ placement;  Surgeon: Loreli Slot, MD;  Location: [facility] OR;  Service: Thoracic;  Laterality: Left;   VIDEO ASSISTED THORACOSCOPY (VATS)/WEDGE RESECTION Right 12/09/2018   Procedure: VIDEO ASSISTED THORACOSCOPY (VATS)/WEDGE RESECTION;  Surgeon: Loreli Slot, MD;  Location: [facility]  OR;  Service: Thoracic;  Laterality: Right;   VIDEO BRONCHOSCOPY Bilateral 12/21/2018   Procedure: VIDEO BRONCHOSCOPY;  Surgeon: Loreli Slot, MD;  Location: [facility] OR;  Service: Thoracic;  Laterality: Bilateral;   VIDEO BRONCHOSCOPY WITH ENDOBRONCHIAL NAVIGATION N/A 07/03/2016   Procedure: VIDEO BRONCHOSCOPY WITH ENDOBRONCHIAL NAVIGATION;  Surgeon: Loreli Slot, MD;  Location: [facility];  Service: Thoracic;  Laterality: N/A;   VIDEO BRONCHOSCOPY WITH ENDOBRONCHIAL NAVIGATION N/A 10/24/2023   Procedure: VIDEO BRONCHOSCOPY WITH ENDOBRONCHIAL NAVIGATION;  Surgeon: Loreli Slot, MD;  Location: [facility];  Service: Thoracic;  Laterality: N/A;   VIDEO BRONCHOSCOPY WITH ENDOBRONCHIAL ULTRASOUND N/A 10/24/2023   Procedure: VIDEO BRONCHOSCOPY WITH ENDOBRONCHIAL ULTRASOUND;  Surgeon: Loreli Slot, MD;  Location: [facility] OR;  Service: Thoracic;  Laterality: N/A;   VIDEO BRONCHOSCOPY WITH INSERTION OF INTERBRONCHIAL VALVE (IBV) N/A  12/11/2018   Procedure: VIDEO BRONCHOSCOPY WITH INSERTION OF INTERBRONCHIAL VALVE (IBV);  Surgeon: Loreli Slot, MD;  Location: [facility] OR;  Service: Thoracic;  Laterality: N/A;   VIDEO BRONCHOSCOPY WITH INSERTION OF INTERBRONCHIAL VALVE (IBV) N/A 02/10/2019   Procedure: VIDEO BRONCHOSCOPY WITH REMOVAL OF INTERBRONCHIAL VALVE (IBV);  Surgeon: Loreli Slot, MD;  Location: [facility] OR;  Service: Thoracic;  Laterality: N/A;    Social History: Social History   Socioeconomic History   Marital status: Married    Spouse name: Not on file   Number of children: 2   Years of education: Not on file   Highest education level: 9th grade  Occupational History   Occupation: Vinyl siding   Occupation: Retired  Tobacco Use   Smoking status: Former    Current packs/day: 0.00    Average packs/day: 1 pack/day for 40.0 years (40.0 ttl pk-yrs)    Types: Cigarettes    Start date: 05/28/1976    Quit date: 05/28/2016    Years since quitting: 7.5   Smokeless tobacco:  Never  Vaping Use   Vaping status: Never Used  Substance and Sexual Activity   Alcohol use: Not Currently    Comment: quit- 2016   Drug use: No   Sexual activity: Not Currently  Other Topics Concern   Not on file  Social History Narrative   Married for 20 years,second.Lives with wife. 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   Social Drivers of Corporate investment banker Strain: High Risk (12/10/2023)   Overall Financial Resource Strain (CARDIA)    Difficulty of Paying Living Expenses: Very hard  Food Insecurity: No Food Insecurity (09/17/2023)   Hunger Vital Sign    Worried About Running Out of Food in the Last Year: Never true    Ran Out of Food in the Last Year: Never true  Transportation Needs: No Transportation Needs (09/17/2023)   PRAPARE - Administrator, Civil Service (Medical): No    Lack of Transportation (Non-Medical): No  Physical Activity: Insufficiently Active (07/11/2023)   Exercise Vital Sign    Days of Exercise per Week: 3 days    Minutes of Exercise per Session: 10 min  Stress: Patient Declined (07/11/2023)   [employer] of Occupational Health - Occupational Stress Questionnaire    Feeling of Stress : Patient declined  Social Connections: Unknown (07/11/2023)   Social Connection and Isolation Panel [NHANES]    Frequency of Communication with Friends and Family: Twice a week    Frequency of Social Gatherings with Friends and Family: Once a week    Attends Religious Services: Patient declined    Database administrator or Organizations: No    Attends Banker Meetings: Never    Marital Status: Married  Catering manager Violence: Patient Unable To Answer (09/17/2023)   Humiliation, Afraid, Rape, and Kick questionnaire    Fear of Current or Ex-Partner: Patient unable to answer    Emotionally Abused: Patient unable to answer    Physically Abused: Patient unable to answer    Sexually Abused: Patient unable to answer    Family  History: Family History  Problem Relation Age of Onset   Cancer Mother    Cancer Sister    Aneurysm Sister    Kidney disease Paternal Aunt    Asthma Brother    Cancer - Lung Brother    Cancer Brother    Arthritis Brother     Current Medications:  Current Outpatient Medications:    acetaminophen (TYLENOL) 325 MG  tablet, Take 2 tablets (650 mg total) by mouth every 6 (six) hours as needed for mild pain (or Fever >/= 101)., Disp: , Rfl:    Budeson-Glycopyrrol-Formoterol (BREZTRI AEROSPHERE) 160-9-4.8 MCG/ACT AERO, Inhale 2 puffs into the lungs in the morning and at bedtime., Disp: , Rfl:    cetirizine (ZYRTEC) 10 MG tablet, Take 10 mg by mouth daily., Disp: , Rfl:    Cholecalciferol (VITAMIN D3) 125 MCG (5000 UT) CAPS, Take 1 capsule (5,000 Units total) by mouth daily., Disp: 30 capsule, Rfl: 0   diltiazem (CARDIZEM CD) 180 MG 24 hr capsule, Take 1 capsule (180 mg total) by mouth daily., Disp: 90 capsule, Rfl: 3   fenofibrate (TRICOR) 145 MG tablet, Take 1 tablet (145 mg total) by mouth daily., Disp: 90 tablet, Rfl: 1   finasteride (PROSCAR) 5 MG tablet, Take 1 tablet (5 mg total) by mouth daily., Disp: 30 tablet, Rfl: 3   guaiFENesin (MUCINEX) 600 MG 12 hr tablet, Take by mouth 2 (two) times daily., Disp: , Rfl:    ipratropium-albuterol (DUONEB) 0.5-2.5 (3) MG/3ML SOLN, USE 1 AMPULE IN NEBULIZER 4 TIMES DAILY AND AS NEEDED FOR SHORTNESS OF BREATH AND FOR WHEEZING, Disp: 450 mL, Rfl: 0   lidocaine-prilocaine (EMLA) cream, Apply to affected area once, Disp: 30 g, Rfl: 3   meclizine (ANTIVERT) 25 MG tablet, Take 1 tablet (25 mg total) by mouth 3 (three) times daily as needed for dizziness., Disp: 30 tablet, Rfl: 0   metoprolol succinate (TOPROL XL) 50 MG 24 hr tablet, Take 1 tablet (50 mg total) by mouth daily. Take with or immediately following a meal., Disp: 30 tablet, Rfl: 2   Omega-3 Fatty Acids (FISH OIL ULTRA PO), Take 1,200 mg by mouth in the morning and at bedtime., Disp: , Rfl:     OXYGEN, Inhale 2 L into the lungs continuous. Hx of lung ca, copd, and emphysema, Disp: , Rfl:    Palonosetron HCl (ALOXI IV), Inject into the vein., Disp: , Rfl:    pantoprazole (PROTONIX) 40 MG tablet, Take 1 tablet (40 mg total) by mouth daily., Disp: 30 tablet, Rfl: 1   polyethylene glycol (MIRALAX / GLYCOLAX) 17 g packet, Take 17 g by mouth daily as needed for mild constipation., Disp: , Rfl:    prochlorperazine (COMPAZINE) 10 MG tablet, Take 1 tablet (10 mg total) by mouth every 6 (six) hours as needed for nausea or vomiting., Disp: 60 tablet, Rfl: 3   rosuvastatin (CRESTOR) 40 MG tablet, Take 1 tablet (40 mg total) by mouth daily., Disp: 90 tablet, Rfl: 3   tamsulosin (FLOMAX) 0.4 MG CAPS capsule, Take 2 capsules (0.8 mg total) by mouth daily., Disp: 180 capsule, Rfl: 3   warfarin (COUMADIN) 5 MG tablet, Take 1 tablet (5 mg total) by mouth daily. (Patient taking differently: Take 2.5-5 mg by mouth See admin instructions. Take 2.5 mg by mouth Tuesday and Friday and take 5 mg on Monday, Wednesday, Thursday, Saturday and Sunday), Disp: 90 tablet, Rfl: 1 No current facility-administered medications for this visit.  Facility-Administered Medications Ordered in Other Visits:    0.9 %  sodium chloride infusion, , Intravenous, Continuous, [name], MD, Stopped at 12/23/23 1339   sodium chloride flush (NS) 0.9 % injection 10 mL, 10 mL, Intracatheter, PRN, [name], MD, 10 mL at 12/23/23 1338   Allergies: No Known Allergies  REVIEW OF SYSTEMS:   Review of Systems  Constitutional:  Negative for chills, fatigue and fever.  HENT:   Negative for lump/mass,  mouth sores, nosebleeds, sore throat and trouble swallowing.        +hoarse voice  Eyes:  Negative for eye problems.  Respiratory:  Positive for shortness of breath (with exertion). Negative for cough.   Cardiovascular:  Negative for chest pain, leg swelling and palpitations.  Gastrointestinal:  Negative for abdominal  pain, constipation, diarrhea, nausea and vomiting.  Genitourinary:  Negative for bladder incontinence, difficulty urinating, dysuria, frequency, hematuria and nocturia.   Musculoskeletal:  Negative for arthralgias, back pain, flank pain, myalgias and neck pain.  Skin:  Negative for itching and rash.  Neurological:  Positive for numbness (and tingling in toes). Negative for dizziness and headaches.  Hematological:  Does not bruise/bleed easily.  Psychiatric/Behavioral:  Positive for sleep disturbance. Negative for depression and suicidal ideas. The patient is not nervous/anxious.   All other systems reviewed and are negative.    VITALS:   There were no vitals taken for this visit.  Wt Readings from Last 3 Encounters:  12/23/23 220 lb 6.4 oz (100 kg)  12/09/23 210 lb (95.3 kg)  12/03/23 210 lb (95.3 kg)    There is no height or weight on file to calculate BMI.  Performance status (ECOG): 1 - Symptomatic but completely ambulatory  PHYSICAL EXAM:   Physical Exam Vitals and nursing note reviewed. Exam conducted with a chaperone present.  Constitutional:      Appearance: Normal appearance.  Cardiovascular:     Rate and Rhythm: Normal rate and regular rhythm.     Pulses: Normal pulses.     Heart sounds: Normal heart sounds.  Pulmonary:     Effort: Pulmonary effort is normal.     Breath sounds: Normal breath sounds.  Abdominal:     Palpations: Abdomen is soft. There is no hepatomegaly, splenomegaly or mass.     Tenderness: There is no abdominal tenderness.  Musculoskeletal:     Right lower leg: No edema.     Left lower leg: No edema.  Lymphadenopathy:     Cervical: No cervical adenopathy.     Right cervical: No superficial, deep or posterior cervical adenopathy.    Left cervical: No superficial, deep or posterior cervical adenopathy.     Upper Body:     Right upper body: No supraclavicular or axillary adenopathy.     Left upper body: No supraclavicular or axillary adenopathy.   Neurological:     General: No focal deficit present.     Mental Status: He is alert and oriented to person, place, and time.  Psychiatric:        Mood and Affect: Mood normal.        Behavior: Behavior normal.     LABS:   CBC    Component Value Date/Time   WBC 6.9 12/23/2023 0859   RBC 4.71 12/23/2023 0859   HGB 12.8 (L) 12/23/2023 0859   HGB 13.6 11/26/2023 1040   HGB 14.8 07/15/2023 1142   HGB 13.5 04/09/2017 1249   HCT 40.4 12/23/2023 0859   HCT 48.2 07/15/2023 1142   HCT 42.4 04/09/2017 1249   PLT 192 12/23/2023 0859   PLT 197 11/26/2023 1040   PLT 205 07/15/2023 1142   MCV 85.8 12/23/2023 0859   MCV 86 07/15/2023 1142   MCV 84.8 04/09/2017 1249   MCH 27.2 12/23/2023 0859   MCHC 31.7 12/23/2023 0859   RDW 15.1 12/23/2023 0859   RDW 13.1 07/15/2023 1142   RDW 15.4 (H) 04/09/2017 1249   LYMPHSABS 1.1 12/23/2023 0859   LYMPHSABS  1.1 07/31/2021 1026   LYMPHSABS 1.0 04/09/2017 1249   MONOABS 0.8 12/23/2023 0859   MONOABS 1.9 (H) 04/09/2017 1249   EOSABS 0.1 12/23/2023 0859   EOSABS 0.1 07/31/2021 1026   BASOSABS 0.0 12/23/2023 0859   BASOSABS 0.0 07/31/2021 1026   BASOSABS 0.0 04/09/2017 1249    CMP    Component Value Date/Time   NA 139 12/23/2023 0859   NA 140 07/15/2023 1142   NA 136 04/09/2017 1249   K 4.1 12/23/2023 0859   K 4.6 04/09/2017 1249   CL 103 12/23/2023 0859   CO2 27 12/23/2023 0859   CO2 27 04/09/2017 1249   GLUCOSE 92 12/23/2023 0859   GLUCOSE 103 04/09/2017 1249   BUN 27 (H) 12/23/2023 0859   BUN 13 07/15/2023 1142   BUN 23.9 04/09/2017 1249   CREATININE 1.07 12/23/2023 0859   CREATININE 1.10 11/26/2023 1040   CREATININE 1.21 (H) 05/08/2020 1051   CREATININE 1.4 (H) 04/09/2017 1249   CALCIUM 9.1 12/23/2023 0859   CALCIUM 9.6 04/09/2017 1249   PROT 6.9 12/23/2023 0859   PROT 6.9 03/04/2023 1515   PROT 7.8 04/09/2017 1249   ALBUMIN 3.9 12/23/2023 0859   ALBUMIN 4.6 03/04/2023 1515   ALBUMIN 3.6 04/09/2017 1249   AST 23  12/23/2023 0859   AST 20 11/26/2023 1040   AST 13 04/09/2017 1249   ALT 25 12/23/2023 0859   ALT 19 11/26/2023 1040   ALT 9 04/09/2017 1249   ALKPHOS 31 (L) 12/23/2023 0859   ALKPHOS 58 04/09/2017 1249   BILITOT 0.7 12/23/2023 0859   BILITOT 0.7 11/26/2023 1040   BILITOT 1.01 04/09/2017 1249   GFRNONAA >60 12/23/2023 0859   GFRNONAA >60 11/26/2023 1040   GFRNONAA 60 05/08/2020 1051   GFRAA >60 06/02/2020 1239   GFRAA 70 05/08/2020 1051     No results found for: "CEA1", "CEA" / No results found for: "CEA1", "CEA" Lab Results  Component Value Date   PSA1 3.5 07/05/2021   No results found for: "QMV784" No results found for: "CAN125"  No results found for: "TOTALPROTELP", "ALBUMINELP", "A1GS", "A2GS", "BETS", "BETA2SER", "GAMS", "MSPIKE", "SPEI" No results found for: "TIBC", "FERRITIN", "IRONPCTSAT" No results found for: "LDH"   STUDIES:   MR BRAIN W WO CONTRAST Result Date: 12/18/2023 CLINICAL DATA:  Non-small cell lung cancer.  Staging. EXAM: MRI HEAD WITHOUT AND WITH CONTRAST TECHNIQUE: Multiplanar, multiecho pulse sequences of the brain and surrounding structures were obtained without and with intravenous contrast. CONTRAST:  10mL GADAVIST GADOBUTROL 1 MMOL/ML IV SOLN COMPARISON:  Noncontrast exam 12/02/2023.  03/12/2021. FINDINGS: Brain: Diffusion imaging does not show any acute or subacute infarction or other cause of restricted diffusion. Mild chronic small-vessel ischemic change affects the pons. No focal cerebellar finding. Cerebral hemispheres show an old infarction in the left PCA territory affecting the posteromedial temporal lobe, occipital lobe and left thalamus. Old small vessel infarction of the right thalamus. Minimal small vessel change otherwise within the cerebral hemispheric white matter. No evidence of intracranial metastatic disease. After contrast administration, no abnormal enhancement occurs. Vascular: Major vessels at the base of the brain show flow. Skull and  upper cervical spine: Negative Sinuses/Orbits: Clear/normal Other: None IMPRESSION: 1. No evidence of intracranial metastatic disease. 2. Old left PCA territory infarction affecting the posteromedial temporal lobe, occipital lobe and left thalamus. Old small vessel infarction of the right thalamus. Minimal small vessel change otherwise within the cerebral hemispheric white matter. Mild chronic small-vessel ischemic change of the pons. Electronically Signed  By: Paulina Fusi M.D.   On: 12/18/2023 11:23   IR IMAGING GUIDED PORT INSERTION Result Date: 12/10/2023 CLINICAL DATA:  Lung cancer, access for chemotherapy EXAM: RIGHT INTERNAL JUGULAR SINGLE LUMEN POWER PORT CATHETER INSERTION Date:  12/09/2023 12/09/2023 4:51 pm Radiologist:  Judie Petit. Ruel Favors, MD Guidance:  Ultrasound and fluoroscopic MEDICATIONS: 1% lidocaine local with epinephrine ANESTHESIA/SEDATION: Versed 1.0 mg IV; Fentanyl 50 mcg IV; Moderate Sedation Time:  31 minutes The patient was continuously monitored during the procedure by the interventional radiology nurse under my direct supervision. FLUOROSCOPY: 0 minutes, 48 seconds (6 mGy) COMPLICATIONS: None immediate. CONTRAST:  None. PROCEDURE: Informed consent was obtained from the patient following explanation of the procedure, risks, benefits and alternatives. The patient understands, agrees and consents for the procedure. All questions were addressed. A time out was performed. Maximal barrier sterile technique utilized including caps, mask, sterile gowns, sterile gloves, large sterile drape, hand hygiene, and 2% chlorhexidine scrub. Under sterile conditions and local anesthesia, right internal jugular micropuncture venous access was performed. Access was performed with ultrasound. Images were obtained for documentation of the patent right internal jugular vein. A guide wire was inserted followed by a transitional dilator. This allowed insertion of a guide wire and catheter into the IVC. Measurements  were obtained from the SVC / RA junction back to the right IJ venotomy site. In the right infraclavicular chest, a subcutaneous pocket was created over the second anterior rib. This was done under sterile conditions and local anesthesia. 1% lidocaine with epinephrine was utilized for this. A 2.5 cm incision was made in the skin. Blunt dissection was performed to create a subcutaneous pocket over the right pectoralis major muscle. The pocket was flushed with saline vigorously. There was adequate hemostasis. The port catheter was assembled and checked for leakage. The port catheter was secured in the pocket with two retention sutures. The tubing was tunneled subcutaneously to the right venotomy site and inserted into the SVC/RA junction through a valved peel-away sheath. Position was confirmed with fluoroscopy. Images were obtained for documentation. The patient tolerated the procedure well. No immediate complications. Incisions were closed in a two layer fashion with 4 - 0 Vicryl suture. Dermabond was applied to the skin. The port catheter was accessed, blood was aspirated followed by saline and heparin flushes. Needle was removed. A dry sterile dressing was applied. IMPRESSION: Ultrasound and fluoroscopically guided right internal jugular single lumen power port catheter insertion. Tip in the SVC/RA junction. Catheter ready for use. Electronically Signed   By: Judie Petit.  Shick M.D.   On: 12/10/2023 08:23   MR BRAIN WO CONTRAST Result Date: 12/02/2023 CLINICAL DATA:  73 year old male with non-small cell lung cancer. Possible recurrence in the chest in November. A study without and with contrast was planned but the patient declined postcontrast imaging, declined IV contrast injection. Only noncontrast images are provided. EXAM: MRI HEAD WITHOUT CONTRAST TECHNIQUE: Multiplanar, multiecho pulse sequences of the brain and surrounding structures were obtained without intravenous contrast. 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 Subsequent encephalomalacia including involvement of the mesial temporal lobe, both thalami. Mild associated hemosiderin on SWI which results in DWI artifact. Some generalized cerebral volume loss since 2022. ex vacuo appearing ventricular enlargement. No restricted diffusion or evidence of acute infarction. No midline shift, mass effect, or evidence of intracranial mass lesion. Chronic but increased periventricular white matter T2 and FLAIR hyperintensity since 2022. No other  cortical encephalomalacia or chronic cerebral blood products identified. Brainstem and cerebellum remain negative. Vascular: Major intracranial vascular flow voids are stable since 2022. Dominant distal left vertebral artery suspected. Skull and upper cervical spine: Sagittal T1 weighted imaging degraded by motion artifact. Visible bone marrow signal is within normal limits. Sinuses/Orbits: Stable, negative. Other: Mastoids are clear. Grossly normal visible internal auditory structures. IMPRESSION: 1. The patient declined IV contrast injection. No strong evidence of cerebral metastatic disease on this noncontrast exam. 2. 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 Encephalomalacia and mild hemosiderin. No acute intracranial abnormality identified. Electronically Signed   By: Odessa Fleming M.D.   On: 12/02/2023 09:21

## 2023-12-23 ENCOUNTER — Inpatient Hospital Stay

## 2023-12-23 ENCOUNTER — Inpatient Hospital Stay (HOSPITAL_BASED_OUTPATIENT_CLINIC_OR_DEPARTMENT_OTHER): Admitting: Hematology

## 2023-12-23 ENCOUNTER — Other Ambulatory Visit: Payer: Self-pay

## 2023-12-23 VITALS — BP 137/76 | HR 64 | Temp 97.5°F | Resp 18

## 2023-12-23 VITALS — BP 122/80 | HR 56 | Temp 97.2°F | Resp 20 | Wt 220.4 lb

## 2023-12-23 DIAGNOSIS — C3490 Malignant neoplasm of unspecified part of unspecified bronchus or lung: Secondary | ICD-10-CM | POA: Diagnosis not present

## 2023-12-23 DIAGNOSIS — C771 Secondary and unspecified malignant neoplasm of intrathoracic lymph nodes: Secondary | ICD-10-CM | POA: Diagnosis not present

## 2023-12-23 DIAGNOSIS — E785 Hyperlipidemia, unspecified: Secondary | ICD-10-CM | POA: Diagnosis not present

## 2023-12-23 DIAGNOSIS — I10 Essential (primary) hypertension: Secondary | ICD-10-CM

## 2023-12-23 DIAGNOSIS — Z8673 Personal history of transient ischemic attack (TIA), and cerebral infarction without residual deficits: Secondary | ICD-10-CM | POA: Diagnosis not present

## 2023-12-23 DIAGNOSIS — C3411 Malignant neoplasm of upper lobe, right bronchus or lung: Secondary | ICD-10-CM | POA: Diagnosis not present

## 2023-12-23 DIAGNOSIS — G9389 Other specified disorders of brain: Secondary | ICD-10-CM | POA: Diagnosis not present

## 2023-12-23 DIAGNOSIS — Z510 Encounter for antineoplastic radiation therapy: Secondary | ICD-10-CM | POA: Diagnosis not present

## 2023-12-23 DIAGNOSIS — C770 Secondary and unspecified malignant neoplasm of lymph nodes of head, face and neck: Secondary | ICD-10-CM | POA: Diagnosis not present

## 2023-12-23 DIAGNOSIS — Z95828 Presence of other vascular implants and grafts: Secondary | ICD-10-CM

## 2023-12-23 DIAGNOSIS — I4891 Unspecified atrial fibrillation: Secondary | ICD-10-CM | POA: Diagnosis not present

## 2023-12-23 DIAGNOSIS — I714 Abdominal aortic aneurysm, without rupture, unspecified: Secondary | ICD-10-CM | POA: Diagnosis not present

## 2023-12-23 DIAGNOSIS — Z5111 Encounter for antineoplastic chemotherapy: Secondary | ICD-10-CM | POA: Diagnosis not present

## 2023-12-23 LAB — CBC WITH DIFFERENTIAL/PLATELET
Abs Immature Granulocytes: 0.04 10*3/uL (ref 0.00–0.07)
Basophils Absolute: 0 10*3/uL (ref 0.0–0.1)
Basophils Relative: 0 %
Eosinophils Absolute: 0.1 10*3/uL (ref 0.0–0.5)
Eosinophils Relative: 2 %
HCT: 40.4 % (ref 39.0–52.0)
Hemoglobin: 12.8 g/dL — ABNORMAL LOW (ref 13.0–17.0)
Immature Granulocytes: 1 %
Lymphocytes Relative: 16 %
Lymphs Abs: 1.1 10*3/uL (ref 0.7–4.0)
MCH: 27.2 pg (ref 26.0–34.0)
MCHC: 31.7 g/dL (ref 30.0–36.0)
MCV: 85.8 fL (ref 80.0–100.0)
Monocytes Absolute: 0.8 10*3/uL (ref 0.1–1.0)
Monocytes Relative: 12 %
Neutro Abs: 4.8 10*3/uL (ref 1.7–7.7)
Neutrophils Relative %: 69 %
Platelets: 192 10*3/uL (ref 150–400)
RBC: 4.71 MIL/uL (ref 4.22–5.81)
RDW: 15.1 % (ref 11.5–15.5)
WBC: 6.9 10*3/uL (ref 4.0–10.5)
nRBC: 0 % (ref 0.0–0.2)

## 2023-12-23 LAB — COMPREHENSIVE METABOLIC PANEL
ALT: 25 U/L (ref 0–44)
AST: 23 U/L (ref 15–41)
Albumin: 3.9 g/dL (ref 3.5–5.0)
Alkaline Phosphatase: 31 U/L — ABNORMAL LOW (ref 38–126)
Anion gap: 9 (ref 5–15)
BUN: 27 mg/dL — ABNORMAL HIGH (ref 8–23)
CO2: 27 mmol/L (ref 22–32)
Calcium: 9.1 mg/dL (ref 8.9–10.3)
Chloride: 103 mmol/L (ref 98–111)
Creatinine, Ser: 1.07 mg/dL (ref 0.61–1.24)
GFR, Estimated: >60 mL/min (ref >60)
Glucose, Bld: 92 mg/dL (ref 70–99)
Potassium: 4.1 mmol/L (ref 3.5–5.1)
Sodium: 139 mmol/L (ref 135–145)
Total Bilirubin: 0.7 mg/dL (ref 0.0–1.2)
Total Protein: 6.9 g/dL (ref 6.5–8.1)

## 2023-12-23 LAB — MAGNESIUM: Magnesium: 2.1 mg/dL (ref 1.7–2.4)

## 2023-12-23 MED ORDER — SODIUM CHLORIDE 0.9 % IV SOLN
215.8000 mg | Freq: Once | INTRAVENOUS | Status: AC
  Administered 2023-12-23: 220 mg via INTRAVENOUS
  Filled 2023-12-23: qty 22

## 2023-12-23 MED ORDER — METOPROLOL SUCCINATE ER 50 MG PO TB24: 50.0000 mg | ORAL_TABLET | Freq: Every day | ORAL | 2 refills | Status: DC

## 2023-12-23 MED ORDER — FAMOTIDINE IN NACL 20-0.9 MG/50ML-% IV SOLN
20.0000 mg | Freq: Once | INTRAVENOUS | Status: AC
  Administered 2023-12-23: 20 mg via INTRAVENOUS
  Filled 2023-12-23: qty 50

## 2023-12-23 MED ORDER — SODIUM CHLORIDE 0.9% FLUSH
10.0000 mL | INTRAVENOUS | Status: DC | PRN
  Administered 2023-12-23: 10 mL

## 2023-12-23 MED ORDER — DEXAMETHASONE SODIUM PHOSPHATE 10 MG/ML IJ SOLN
10.0000 mg | Freq: Once | INTRAMUSCULAR | Status: AC
  Administered 2023-12-23: 10 mg via INTRAVENOUS
  Filled 2023-12-23: qty 1

## 2023-12-23 MED ORDER — SODIUM CHLORIDE 0.9 % IV SOLN: INTRAVENOUS | Status: DC

## 2023-12-23 MED ORDER — HEPARIN SOD (PORK) LOCK FLUSH 100 UNIT/ML IV SOLN
500.0000 [IU] | Freq: Once | INTRAVENOUS | Status: AC | PRN
Start: 2023-12-23 — End: 2023-12-23
  Administered 2023-12-23: 500 [IU]

## 2023-12-23 MED ORDER — SODIUM CHLORIDE 0.9 % IV SOLN
45.0000 mg/m2 | Freq: Once | INTRAVENOUS | Status: AC
  Administered 2023-12-23: 96 mg via INTRAVENOUS
  Filled 2023-12-23: qty 16

## 2023-12-23 MED ORDER — CETIRIZINE HCL 10 MG/ML IV SOLN
10.0000 mg | Freq: Once | INTRAVENOUS | Status: AC
  Administered 2023-12-23: 10 mg via INTRAVENOUS
  Filled 2023-12-23: qty 1

## 2023-12-23 MED ORDER — SODIUM CHLORIDE FLUSH 0.9 % IV SOLN
10.0000 mL | Freq: Once | INTRAVENOUS | Status: AC
  Administered 2023-12-23: 10 mL via INTRAVENOUS

## 2023-12-23 MED ORDER — PALONOSETRON HCL INJECTION 0.25 MG/5ML
0.2500 mg | Freq: Once | INTRAVENOUS | Status: AC
  Administered 2023-12-23: 0.25 mg via INTRAVENOUS
  Filled 2023-12-23: qty 5

## 2023-12-23 NOTE — Patient Instructions (Signed)
 [facility] [place] - A DEPT OF [facility]  Discharge Instructions: Thank you for choosing [facility] to provide your oncology and hematology care.  If you have a lab appointment with the Cancer Center - please note that after April 8th, 2024, all labs will be drawn in the cancer center.  You do not have to check in or register with the main entrance as you have in the past but will complete your check-in in the cancer center.  Wear comfortable clothing and clothing appropriate for easy access to any Portacath or PICC line.   We strive to give you quality time with your provider. You may need to reschedule your appointment if you arrive late (15 or more minutes).  Arriving late affects you and other patients whose appointments are after yours.  Also, if you miss three or more appointments without notifying the office, you may be dismissed from the clinic at the provider's discretion.      For prescription refill requests, have your pharmacy contact our office and allow 72 hours for refills to be completed.    Today you received the following chemotherapy and/or immunotherapy agents Taxol and Carboplatin, return as scheduled.   To help prevent nausea and vomiting after your treatment, we encourage you to take your nausea medication as directed.  BELOW ARE SYMPTOMS THAT SHOULD BE REPORTED IMMEDIATELY: *FEVER GREATER THAN 100.4 F (38 C) OR HIGHER *CHILLS OR SWEATING *NAUSEA AND VOMITING THAT IS NOT CONTROLLED WITH YOUR NAUSEA MEDICATION *UNUSUAL SHORTNESS OF BREATH *UNUSUAL BRUISING OR BLEEDING *URINARY PROBLEMS (pain or burning when urinating, or frequent urination) *BOWEL PROBLEMS (unusual diarrhea, constipation, pain near the anus) TENDERNESS IN MOUTH AND THROAT WITH OR WITHOUT PRESENCE OF ULCERS (sore throat, sores in mouth, or a toothache) UNUSUAL RASH, SWELLING OR PAIN  UNUSUAL VAGINAL DISCHARGE OR ITCHING   Items with * indicate a potential  emergency and should be followed up as soon as possible or go to the Emergency Department if any problems should occur.  Please show the CHEMOTHERAPY ALERT CARD or IMMUNOTHERAPY ALERT CARD at check-in to the Emergency Department and triage nurse.  Should you have questions after your visit or need to cancel or reschedule your appointment, please contact [facility] [place] - A DEPT OF [facility] [phone]  and follow the prompts.  Office hours are 8:00 a.m. to 4:30 p.m. Monday - Friday. Please note that voicemails left after 4:00 p.m. may not be returned until the following business day.  We are closed weekends and major holidays. You have access to a nurse at all times for urgent questions. Please call the main number to the clinic [phone] and follow the prompts.  For any non-urgent questions, you may also contact your provider using MyChart. We now offer e-Visits for anyone 64 and older to request care online for non-urgent symptoms. For details visit [email or web address].   Also download the MyChart app! Go to the app store, search "MyChart", open the app, select [place], and log in with your MyChart username and password.

## 2023-12-23 NOTE — Progress Notes (Signed)
 Patient presents today for Day 1 Taxol/Carbo, patient okay for treatment per Dr. Ellin Saba. Patient tolerated chemotherapy with no complaints voiced. Side effects with management reviewed understanding verbalized. Port site clean and dry with no bruising or swelling noted at site. Good blood return noted before and after administration of chemotherapy. Band aid applied. Patient left in satisfactory condition with VSS and no s/s of distress noted.

## 2023-12-23 NOTE — Patient Instructions (Signed)

## 2023-12-23 NOTE — Progress Notes (Signed)
 Patient has been examined by Dr. Ellin Saba. Vital signs and labs have been reviewed by MD - ANC, Creatinine, LFTs, hemoglobin, and platelets are within treatment parameters per M.D. - pt may proceed with treatment.  Primary RN and pharmacy notified.

## 2023-12-24 ENCOUNTER — Telehealth: Payer: Self-pay

## 2023-12-24 DIAGNOSIS — Z8673 Personal history of transient ischemic attack (TIA), and cerebral infarction without residual deficits: Secondary | ICD-10-CM | POA: Diagnosis not present

## 2023-12-24 DIAGNOSIS — I10 Essential (primary) hypertension: Secondary | ICD-10-CM | POA: Diagnosis not present

## 2023-12-24 DIAGNOSIS — C3411 Malignant neoplasm of upper lobe, right bronchus or lung: Secondary | ICD-10-CM | POA: Diagnosis not present

## 2023-12-24 DIAGNOSIS — C771 Secondary and unspecified malignant neoplasm of intrathoracic lymph nodes: Secondary | ICD-10-CM | POA: Diagnosis not present

## 2023-12-24 DIAGNOSIS — Z510 Encounter for antineoplastic radiation therapy: Secondary | ICD-10-CM | POA: Diagnosis not present

## 2023-12-24 DIAGNOSIS — E785 Hyperlipidemia, unspecified: Secondary | ICD-10-CM | POA: Diagnosis not present

## 2023-12-24 NOTE — Telephone Encounter (Signed)
 Patient called for 24-hour follow up, no response form patient.

## 2023-12-25 DIAGNOSIS — Z510 Encounter for antineoplastic radiation therapy: Secondary | ICD-10-CM | POA: Diagnosis not present

## 2023-12-25 DIAGNOSIS — E785 Hyperlipidemia, unspecified: Secondary | ICD-10-CM | POA: Diagnosis not present

## 2023-12-25 DIAGNOSIS — C771 Secondary and unspecified malignant neoplasm of intrathoracic lymph nodes: Secondary | ICD-10-CM | POA: Diagnosis not present

## 2023-12-25 DIAGNOSIS — Z8673 Personal history of transient ischemic attack (TIA), and cerebral infarction without residual deficits: Secondary | ICD-10-CM | POA: Diagnosis not present

## 2023-12-25 DIAGNOSIS — C3411 Malignant neoplasm of upper lobe, right bronchus or lung: Secondary | ICD-10-CM | POA: Diagnosis not present

## 2023-12-25 DIAGNOSIS — I10 Essential (primary) hypertension: Secondary | ICD-10-CM | POA: Diagnosis not present

## 2023-12-26 ENCOUNTER — Other Ambulatory Visit: Payer: Self-pay

## 2023-12-26 DIAGNOSIS — Z8673 Personal history of transient ischemic attack (TIA), and cerebral infarction without residual deficits: Secondary | ICD-10-CM | POA: Diagnosis not present

## 2023-12-26 DIAGNOSIS — C3411 Malignant neoplasm of upper lobe, right bronchus or lung: Secondary | ICD-10-CM | POA: Diagnosis not present

## 2023-12-26 DIAGNOSIS — I10 Essential (primary) hypertension: Secondary | ICD-10-CM | POA: Diagnosis not present

## 2023-12-26 DIAGNOSIS — C771 Secondary and unspecified malignant neoplasm of intrathoracic lymph nodes: Secondary | ICD-10-CM | POA: Diagnosis not present

## 2023-12-26 DIAGNOSIS — E785 Hyperlipidemia, unspecified: Secondary | ICD-10-CM | POA: Diagnosis not present

## 2023-12-26 DIAGNOSIS — Z510 Encounter for antineoplastic radiation therapy: Secondary | ICD-10-CM | POA: Diagnosis not present

## 2023-12-28 ENCOUNTER — Encounter: Payer: Self-pay | Admitting: Internal Medicine

## 2023-12-29 ENCOUNTER — Other Ambulatory Visit: Payer: Self-pay

## 2023-12-29 DIAGNOSIS — E785 Hyperlipidemia, unspecified: Secondary | ICD-10-CM | POA: Diagnosis not present

## 2023-12-29 DIAGNOSIS — Z510 Encounter for antineoplastic radiation therapy: Secondary | ICD-10-CM | POA: Diagnosis not present

## 2023-12-29 DIAGNOSIS — C771 Secondary and unspecified malignant neoplasm of intrathoracic lymph nodes: Secondary | ICD-10-CM | POA: Diagnosis not present

## 2023-12-29 DIAGNOSIS — Z8673 Personal history of transient ischemic attack (TIA), and cerebral infarction without residual deficits: Secondary | ICD-10-CM | POA: Diagnosis not present

## 2023-12-29 DIAGNOSIS — C3411 Malignant neoplasm of upper lobe, right bronchus or lung: Secondary | ICD-10-CM | POA: Diagnosis not present

## 2023-12-29 DIAGNOSIS — I10 Essential (primary) hypertension: Secondary | ICD-10-CM | POA: Diagnosis not present

## 2023-12-29 MED ORDER — FENOFIBRATE 145 MG PO TABS: 145.0000 mg | ORAL_TABLET | Freq: Every day | ORAL | 1 refills | Status: DC

## 2023-12-29 NOTE — Progress Notes (Signed)
 [facility] [address] [place] [address]   Clinic Day:  12/30/2023  Referring physician: Anabel Halon, MD  Patient Care Team: Anabel Halon, MD as PCP - General (Internal Medicine) [name], MD as Medical Oncologist (Medical Oncology) Therese Sarah, RN as Oncology Nurse Navigator (Medical Oncology)   ASSESSMENT & PLAN:   Assessment:  1.  Recurrent adenocarcinoma of the lung: - Stage Ia (T1b N0 M0) adenocarcinoma of the left upper lobe, s/p left VATS, left upper lobectomy with mediastinal lymph node dissection by Dr. Dorris Fetch on 08/01/2016 - Stage Ia moderately differentiated adenocarcinoma, 0.7 cm of the right upper lobe, s/p right VATS with wedge resection of right upper lobe nodule and bleb resection by Dr. Dorris Fetch on 12/09/2018 - PET scan (08/19/2023): Dysregulation artifact degrades image quality.  Recurrent bronchogenic carcinoma in the medial right upper lobe along the resection margin with no evidence of distant metastatic disease.  Faint 6 mm nodule in the superior segment right lower lobe too small for PET resolution.  Focal hypermetabolism in the central prostate. - CT super D chest (10/21/2023): Solid 2.6 cm central right upper lobe pulmonary nodule along the wedge resection suture line.  Solid 1.3 cm and 0.7 cm peripheral right lower lobe lung nodules. - MRI brain (12/02/2023): Without IV contrast with no strong evidence of cerebral metastatic disease. - Right paratracheal lymph node FNA (10/24/2023): Adenocarcinoma - Foundation 1 CDX: Insufficient tissue - PD-L1 22 C3 (TPS): 5% - Chemoradiation therapy with weekly carboplatin and paclitaxel started on 12/23/2023  2.  Social/family history: - He lives at home with his wife [name].  He had a stroke in 2022 which has caused short-term memory loss, loss of right-sided peripheral vision and balance problems.  He currently walks with a cane and sometimes walker.  He is also on continuous  oxygen since hospitalization in December 2024.  Most of the day he spends time sitting in the chair.  He can wash dishes but does require some help with some of the ADLs.  Quit smoking in 2017. - Mother and sister had ovarian cancer.  Half brother had lung cancer.   Plan:  1.  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 - Did not experience any GI side effects.  However he felt slightly more tired.  Appetite has been good. - Labs today: Normal LFTs and creatinine.  Alkaloids are normal.  CBC was normal. - He may proceed with week 2 of carboplatin and paclitaxel today.  He is tolerating radiation very well.  RTC 1 week for follow-up.  2.  Hoarseness: - Reported hoarseness since December.  It is worse in the afternoons.  We have sent referral to ENT for further evaluation.  3.  Peripheral neuropathy: - He has numbness in the fingertips of both hands, right more than left since stroke.  This has not gotten worse.  Will closely monitor.   No orders of the defined types were placed in this encounter.     [name],acting as a Neurosurgeon for [name], MD.,have documented all relevant documentation on the behalf of [name], MD,as directed by  [name], MD while in the presence of [name], MD.  I, [name] MD, have reviewed the above documentation for accuracy and completeness, and I agree with the above.     [name], MD   4/1/20259:49 AM  CHIEF COMPLAINT/PURPOSE OF CONSULT:   Diagnosis:  Recurrent stage III adenocarcinoma   Cancer Staging  Primary lung adenocarcinoma (HCC) Staging form: Lung, AJCC 8th Edition - Clinical: Stage IA2 (cT1b, cN0, cM0) - Signed by [name], MD on 12/04/2020  Recurrent non-small cell lung cancer (NSCLC) (HCC) Staging form: Lung, AJCC V9 - Clinical stage from 12/03/2023: Stage IA3 (rcT1c, cN0, cM0) - Unsigned    Prior  Therapy: Left upper lobectomy (2017) and right upper lobectomy (2020)  Current Therapy: Concurrent chemoradiation therapy   HISTORY OF PRESENT ILLNESS:   Oncology History  Primary lung adenocarcinoma (HCC)  10/15/2016 Initial Diagnosis   Primary lung adenocarcinoma (HCC)   12/04/2020 Cancer Staging   Staging form: Lung, AJCC 8th Edition - Clinical: Stage IA2 (cT1b, cN0, cM0) - Signed by [name], MD on 12/04/2020   Recurrent non-small cell lung cancer (NSCLC) (HCC)  12/03/2023 Initial Diagnosis   Recurrent non-small cell lung cancer (NSCLC) (HCC)   12/23/2023 -  Chemotherapy   Patient is on Treatment Plan : LUNG Carboplatin + Paclitaxel + XRT q7d         Randall Sullivan is a 73 y.o. male presenting to clinic today for evaluation of recurrent adenocarcinoma of right lung, non small cell type at the request of Dr. Arbutus Ped.  Linell was first diagnosed in 2017 with Stage IA (T1b, N0, M0) non-small cell lung cancer, well-differentiated adenocarcinoma presented with left upper lobe pulmonary nodule. He then underwent left VATS and left upper lobectomy with mediastinal lymph node dissection under Dr. Dorris Fetch on 08/01/2016. [name] has since been under surveillance for left lung adenocarcinoma.  He was diagnosed with stage Ia non-small cell lung cancer in March 2020 and underwent right VATS with wedge resection of right upper lobe nodule and bleb resection under Dr. Dorris Fetch on 12/10/2018. He was under observation until a CT scan on 07/08/23 found an increasing mass like soft tissue along the suture margin in the central right upper lobe. PET on 08/19/23 found: recurrent bronchogenic carcinoma in the medial right upper lobe along the resection margin with faint 0.6 cm nodule in the superior segment of the right lower lobe too small for PET resolution.   Dsean then had a bronchoscopy on 10/24/23 with right paratracheal lymph node biopsy and endobronchial valve replacement in the anterior apical, posterior  segments of right upper lobe, and superior segment of right lower lobe. Pathology of lymph node biopsy showed: adenocarcinoma. Molecular pathology showed a PDL1 score of 5%.   His cancer is now considered Stage III NSCLC adenocarcinoma due to right paratracheal lymph node involvement. Brondon had discussion of treatment with Dr. Arbutus Ped on 11/26/23 that consisted of weekly mild chemotherapy with adjunct radiation at [facility] with their side effects.   MRI brain done on 12/02/23 showed: No strong evidence of cerebral metastatic disease on this noncontrast exam. 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 Encephalomalacia and mild hemosiderin. No acute intracranial abnormality identified.  Randall Sullivan has a history of stroke, with symptoms of fatigue, weakness, vision changes, and hoarseness. He also has pertinent history of COPD and requires at home oxygen.  Today, he states that he is doing well overall. His appetite level is at 100%. His energy level is at 50%.  INTERVAL HISTORY:   Randall Sullivan is a 73 y.o. male presenting to the clinic today for follow-up of recurrent stage III NSCLC. He was last seen by me on 12/23/23.  Today, he states that he is doing well overall. His appetite level is at 100%. His energy level is  at 10%.   PAST MEDICAL HISTORY:   Past Medical History: Past Medical History:  Diagnosis Date   A-fib (HCC) 2017   after last lung surgery patient had a history if Afib documented in hospital    AAA (abdominal aortic aneurysm) (HCC)    4.0 cm 09/17/22 [place]   Acute appendicitis 07/14/2021   Arthritis    back & legs ( knees)   Cancer (HCC)    lung   Complication of anesthesia    agitated upon waking from anesth.    Dyspnea    Dysrhythmia    A. Fib   Emphysema    Emphysema of lung (HCC)    History of kidney stones    Hyperlipidemia    Hypertension    Oxygen dependent    2L continuous   Peripheral vascular disease (HCC)    Aortic Aneurysm   Pneumonia     Pre-diabetes    Sleep apnea    No OSA, but wears 2L [place] O2 continuous including at night   Stroke [facility]) 2022   Right sided weakness, decreased hearing, balance and memory loss   Tobacco abuse     Surgical History: Past Surgical History:  Procedure Laterality Date   HERNIA REPAIR Bilateral 1999   umbilical   IR IMAGING GUIDED PORT INSERTION  12/09/2023   LAPAROSCOPIC APPENDECTOMY N/A 07/15/2021   Procedure: APPENDECTOMY LAPAROSCOPIC;  Surgeon: Lucretia Roers, MD;  Location: [facility];  Service: General;  Laterality: N/A;   VIDEO ASSISTED THORACOSCOPY Right 12/21/2018   Procedure: VIDEO ASSISTED THORACOSCOPY AND RESECTION OF BLEBS RIGHT LOWER LOBE;  Surgeon: Loreli Slot, MD;  Location: [facility] OR;  Service: Thoracic;  Laterality: Right;   VIDEO ASSISTED THORACOSCOPY (VATS)/ LOBECTOMY Left 08/01/2016   Procedure: VIDEO ASSISTED THORACOSCOPY (VATS)/ LEFT UPPER LOBECTOMY with lymph node sampling and onQ placement;  Surgeon: Loreli Slot, MD;  Location: [facility] OR;  Service: Thoracic;  Laterality: Left;   VIDEO ASSISTED THORACOSCOPY (VATS)/WEDGE RESECTION Right 12/09/2018   Procedure: VIDEO ASSISTED THORACOSCOPY (VATS)/WEDGE RESECTION;  Surgeon: Loreli Slot, MD;  Location: [facility] OR;  Service: Thoracic;  Laterality: Right;   VIDEO BRONCHOSCOPY Bilateral 12/21/2018   Procedure: VIDEO BRONCHOSCOPY;  Surgeon: Loreli Slot, MD;  Location: [facility] OR;  Service: Thoracic;  Laterality: Bilateral;   VIDEO BRONCHOSCOPY WITH ENDOBRONCHIAL NAVIGATION N/A 07/03/2016   Procedure: VIDEO BRONCHOSCOPY WITH ENDOBRONCHIAL NAVIGATION;  Surgeon: Loreli Slot, MD;  Location: [facility] OR;  Service: Thoracic;  Laterality: N/A;   VIDEO BRONCHOSCOPY WITH ENDOBRONCHIAL NAVIGATION N/A 10/24/2023   Procedure: VIDEO BRONCHOSCOPY WITH ENDOBRONCHIAL NAVIGATION;  Surgeon: Loreli Slot, MD;  Location: [facility] OR;  Service: Thoracic;  Laterality: N/A;   VIDEO BRONCHOSCOPY WITH ENDOBRONCHIAL ULTRASOUND N/A  10/24/2023   Procedure: VIDEO BRONCHOSCOPY WITH ENDOBRONCHIAL ULTRASOUND;  Surgeon: Loreli Slot, MD;  Location: [facility] OR;  Service: Thoracic;  Laterality: N/A;   VIDEO BRONCHOSCOPY WITH INSERTION OF INTERBRONCHIAL VALVE (IBV) N/A 12/11/2018   Procedure: VIDEO BRONCHOSCOPY WITH INSERTION OF INTERBRONCHIAL VALVE (IBV);  Surgeon: Loreli Slot, MD;  Location: [facility] OR;  Service: Thoracic;  Laterality: N/A;   VIDEO BRONCHOSCOPY WITH INSERTION OF INTERBRONCHIAL VALVE (IBV) N/A 02/10/2019   Procedure: VIDEO BRONCHOSCOPY WITH REMOVAL OF INTERBRONCHIAL VALVE (IBV);  Surgeon: Loreli Slot, MD;  Location: [facility] OR;  Service: Thoracic;  Laterality: N/A;    Social History: Social History   Socioeconomic History   Marital status: Married    Spouse name: Not on file   Number of children: 2  Years of education: Not on file   Highest education level: 9th grade  Occupational History   Occupation: Vinyl siding   Occupation: Retired  Tobacco Use   Smoking status: Former    Current packs/day: 0.00    Average packs/day: 1 pack/day for 40.0 years (40.0 ttl pk-yrs)    Types: Cigarettes    Start date: 05/28/1976    Quit date: 05/28/2016    Years since quitting: 7.5   Smokeless tobacco: Never  Vaping Use   Vaping status: Never Used  Substance and Sexual Activity   Alcohol use: Not Currently    Comment: quit- 2016   Drug use: No   Sexual activity: Not Currently  Other Topics Concern   Not on file  Social History Narrative   Married for 20 years,second.Lives with wife. 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   Social Drivers of Corporate investment banker Strain: High Risk (12/10/2023)   Overall Financial Resource Strain (CARDIA)    Difficulty of Paying Living Expenses: Very hard  Food Insecurity: No Food Insecurity (09/17/2023)   Hunger Vital Sign    Worried About Running Out of Food in the Last Year: Never true    Ran Out of Food in the Last Year: Never true  Transportation  Needs: No Transportation Needs (09/17/2023)   PRAPARE - Administrator, Civil Service (Medical): No    Lack of Transportation (Non-Medical): No  Physical Activity: Insufficiently Active (07/11/2023)   Exercise Vital Sign    Days of Exercise per Week: 3 days    Minutes of Exercise per Session: 10 min  Stress: Patient Declined (07/11/2023)   [employer] of Occupational Health - Occupational Stress Questionnaire    Feeling of Stress : Patient declined  Social Connections: Unknown (07/11/2023)   Social Connection and Isolation Panel [NHANES]    Frequency of Communication with Friends and Family: Twice a week    Frequency of Social Gatherings with Friends and Family: Once a week    Attends Religious Services: Patient declined    Database administrator or Organizations: No    Attends Engineer, structural: Not on file    Marital Status: Married  Intimate Partner Violence: Patient Unable To Answer (09/17/2023)   Humiliation, Afraid, Rape, and Kick questionnaire    Fear of Current or Ex-Partner: Patient unable to answer    Emotionally Abused: Patient unable to answer    Physically Abused: Patient unable to answer    Sexually Abused: Patient unable to answer    Family History: Family History  Problem Relation Age of Onset   Cancer Mother    Cancer Sister    Aneurysm Sister    Kidney disease Paternal Aunt    Asthma Brother    Cancer - Lung Brother    Cancer Brother    Arthritis Brother     Current Medications:  Current Outpatient Medications:    acetaminophen (TYLENOL) 325 MG tablet, Take 2 tablets (650 mg total) by mouth every 6 (six) hours as needed for mild pain (or Fever >/= 101)., Disp: , Rfl:    ALPRAZolam (XANAX) 0.5 MG tablet, Take 0.5 mg by mouth daily., Disp: , Rfl:    Budeson-Glycopyrrol-Formoterol (BREZTRI AEROSPHERE) 160-9-4.8 MCG/ACT AERO, Inhale 2 puffs into the lungs in the morning and at bedtime., Disp: , Rfl:    cetirizine (ZYRTEC) 10 MG  tablet, Take 10 mg by mouth daily., Disp: , Rfl:    Cholecalciferol (VITAMIN D3) 125 MCG (5000 UT) CAPS, Take 1 capsule (  5,000 Units total) by mouth daily., Disp: 30 capsule, Rfl: 0   diltiazem (CARDIZEM CD) 180 MG 24 hr capsule, Take 1 capsule (180 mg total) by mouth daily., Disp: 90 capsule, Rfl: 3   fenofibrate (TRICOR) 145 MG tablet, Take 1 tablet (145 mg total) by mouth daily., Disp: 90 tablet, Rfl: 1   finasteride (PROSCAR) 5 MG tablet, Take 1 tablet (5 mg total) by mouth daily., Disp: 30 tablet, Rfl: 3   guaiFENesin (MUCINEX) 600 MG 12 hr tablet, Take by mouth 2 (two) times daily., Disp: , Rfl:    ipratropium-albuterol (DUONEB) 0.5-2.5 (3) MG/3ML SOLN, USE 1 AMPULE IN NEBULIZER 4 TIMES DAILY AND AS NEEDED FOR SHORTNESS OF BREATH AND FOR WHEEZING, Disp: 450 mL, Rfl: 0   lidocaine-prilocaine (EMLA) cream, Apply to affected area once, Disp: 30 g, Rfl: 3   meclizine (ANTIVERT) 25 MG tablet, Take 1 tablet (25 mg total) by mouth 3 (three) times daily as needed for dizziness., Disp: 30 tablet, Rfl: 0   metoprolol succinate (TOPROL XL) 50 MG 24 hr tablet, Take 1 tablet (50 mg total) by mouth daily. Take with or immediately following a meal., Disp: 30 tablet, Rfl: 2   Omega-3 Fatty Acids (FISH OIL ULTRA PO), Take 1,200 mg by mouth in the morning and at bedtime., Disp: , Rfl:    OXYGEN, Inhale 2 L into the lungs continuous. Hx of lung ca, copd, and emphysema, Disp: , Rfl:    Palonosetron HCl (ALOXI IV), Inject into the vein., Disp: , Rfl:    pantoprazole (PROTONIX) 40 MG tablet, Take 1 tablet (40 mg total) by mouth daily., Disp: 30 tablet, Rfl: 1   polyethylene glycol (MIRALAX / GLYCOLAX) 17 g packet, Take 17 g by mouth daily as needed for mild constipation., Disp: , Rfl:    prochlorperazine (COMPAZINE) 10 MG tablet, Take 1 tablet (10 mg total) by mouth every 6 (six) hours as needed for nausea or vomiting., Disp: 60 tablet, Rfl: 3   rosuvastatin (CRESTOR) 40 MG tablet, Take 1 tablet (40 mg total) by  mouth daily., Disp: 90 tablet, Rfl: 3   tamsulosin (FLOMAX) 0.4 MG CAPS capsule, Take 2 capsules (0.8 mg total) by mouth daily., Disp: 180 capsule, Rfl: 3   warfarin (COUMADIN) 5 MG tablet, Take 1 tablet (5 mg total) by mouth daily. (Patient taking differently: Take 2.5-5 mg by mouth See admin instructions. Take 2.5 mg by mouth Tuesday and Friday and take 5 mg on Monday, Wednesday, Thursday, Saturday and Sunday), Disp: 90 tablet, Rfl: 1   Allergies: No Known Allergies  REVIEW OF SYSTEMS:   Review of Systems  Constitutional:  Negative for chills, fatigue and fever.  HENT:   Negative for lump/mass, mouth sores, nosebleeds, sore throat and trouble swallowing.   Eyes:  Negative for eye problems.  Respiratory:  Positive for cough and shortness of breath.   Cardiovascular:  Negative for chest pain, leg swelling and palpitations.  Gastrointestinal:  Negative for abdominal pain, constipation, diarrhea, nausea and vomiting.  Genitourinary:  Negative for bladder incontinence, difficulty urinating, dysuria, frequency, hematuria and nocturia.   Musculoskeletal:  Negative for arthralgias, back pain, flank pain, myalgias and neck pain.  Skin:  Negative for itching and rash.  Neurological:  Positive for numbness. Negative for dizziness and headaches.  Hematological:  Does not bruise/bleed easily.  Psychiatric/Behavioral:  Positive for sleep disturbance. Negative for depression and suicidal ideas. The patient is not nervous/anxious.   All other systems reviewed and are negative.    VITALS:  There were no vitals taken for this visit.  Wt Readings from Last 3 Encounters:  12/30/23 218 lb 0.6 oz (98.9 kg)  12/23/23 220 lb 6.4 oz (100 kg)  12/09/23 210 lb (95.3 kg)    There is no height or weight on file to calculate BMI.  Performance status (ECOG): 1 - Symptomatic but completely ambulatory  PHYSICAL EXAM:   Physical Exam Vitals and nursing note reviewed. Exam conducted with a chaperone present.   Constitutional:      Appearance: Normal appearance.  Cardiovascular:     Rate and Rhythm: Normal rate and regular rhythm.     Pulses: Normal pulses.     Heart sounds: Normal heart sounds.  Pulmonary:     Effort: Pulmonary effort is normal.     Breath sounds: Normal breath sounds.  Abdominal:     Palpations: Abdomen is soft. There is no hepatomegaly, splenomegaly or mass.     Tenderness: There is no abdominal tenderness.  Musculoskeletal:     Right lower leg: No edema.     Left lower leg: No edema.  Lymphadenopathy:     Cervical: No cervical adenopathy.     Right cervical: No superficial, deep or posterior cervical adenopathy.    Left cervical: No superficial, deep or posterior cervical adenopathy.     Upper Body:     Right upper body: No supraclavicular or axillary adenopathy.     Left upper body: No supraclavicular or axillary adenopathy.  Neurological:     General: No focal deficit present.     Mental Status: He is alert and oriented to person, place, and time.  Psychiatric:        Mood and Affect: Mood normal.        Behavior: Behavior normal.     LABS:   CBC    Component Value Date/Time   WBC 5.9 12/30/2023 0843   RBC 4.75 12/30/2023 0843   HGB 12.9 (L) 12/30/2023 0843   HGB 13.6 11/26/2023 1040   HGB 14.8 07/15/2023 1142   HGB 13.5 04/09/2017 1249   HCT 40.7 12/30/2023 0843   HCT 48.2 07/15/2023 1142   HCT 42.4 04/09/2017 1249   PLT 204 12/30/2023 0843   PLT 197 11/26/2023 1040   PLT 205 07/15/2023 1142   MCV 85.7 12/30/2023 0843   MCV 86 07/15/2023 1142   MCV 84.8 04/09/2017 1249   MCH 27.2 12/30/2023 0843   MCHC 31.7 12/30/2023 0843   RDW 14.6 12/30/2023 0843   RDW 13.1 07/15/2023 1142   RDW 15.4 (H) 04/09/2017 1249   LYMPHSABS 0.8 12/30/2023 0843   LYMPHSABS 1.1 07/31/2021 1026   LYMPHSABS 1.0 04/09/2017 1249   MONOABS 0.5 12/30/2023 0843   MONOABS 1.9 (H) 04/09/2017 1249   EOSABS 0.1 12/30/2023 0843   EOSABS 0.1 07/31/2021 1026   BASOSABS 0.0  12/30/2023 0843   BASOSABS 0.0 07/31/2021 1026   BASOSABS 0.0 04/09/2017 1249    CMP    Component Value Date/Time   NA 136 12/30/2023 0843   NA 140 07/15/2023 1142   NA 136 04/09/2017 1249   K 4.2 12/30/2023 0843   K 4.6 04/09/2017 1249   CL 102 12/30/2023 0843   CO2 27 12/30/2023 0843   CO2 27 04/09/2017 1249   GLUCOSE 101 (H) 12/30/2023 0843   GLUCOSE 103 04/09/2017 1249   BUN 23 12/30/2023 0843   BUN 13 07/15/2023 1142   BUN 23.9 04/09/2017 1249   CREATININE 1.02 12/30/2023 0843   CREATININE 1.10  11/26/2023 1040   CREATININE 1.21 (H) 05/08/2020 1051   CREATININE 1.4 (H) 04/09/2017 1249   CALCIUM 8.9 12/30/2023 0843   CALCIUM 9.6 04/09/2017 1249   PROT 6.6 12/30/2023 0843   PROT 6.9 03/04/2023 1515   PROT 7.8 04/09/2017 1249   ALBUMIN 3.7 12/30/2023 0843   ALBUMIN 4.6 03/04/2023 1515   ALBUMIN 3.6 04/09/2017 1249   AST 20 12/30/2023 0843   AST 20 11/26/2023 1040   AST 13 04/09/2017 1249   ALT 24 12/30/2023 0843   ALT 19 11/26/2023 1040   ALT 9 04/09/2017 1249   ALKPHOS 30 (L) 12/30/2023 0843   ALKPHOS 58 04/09/2017 1249   BILITOT 0.9 12/30/2023 0843   BILITOT 0.7 11/26/2023 1040   BILITOT 1.01 04/09/2017 1249   GFRNONAA >60 12/30/2023 0843   GFRNONAA >60 11/26/2023 1040   GFRNONAA 60 05/08/2020 1051   GFRAA >60 06/02/2020 1239   GFRAA 70 05/08/2020 1051     No results found for: "CEA1", "CEA" / No results found for: "CEA1", "CEA" Lab Results  Component Value Date   PSA1 3.5 07/05/2021   No results found for: "ZOX096" No results found for: "CAN125"  No results found for: "TOTALPROTELP", "ALBUMINELP", "A1GS", "A2GS", "BETS", "BETA2SER", "GAMS", "MSPIKE", "SPEI" No results found for: "TIBC", "FERRITIN", "IRONPCTSAT" No results found for: "LDH"   STUDIES:   MR BRAIN W WO CONTRAST Result Date: 12/18/2023 CLINICAL DATA:  Non-small cell lung cancer.  Staging. EXAM: MRI HEAD WITHOUT AND WITH CONTRAST TECHNIQUE: Multiplanar, multiecho pulse sequences of the  brain and surrounding structures were obtained without and with intravenous contrast. CONTRAST:  10mL GADAVIST GADOBUTROL 1 MMOL/ML IV SOLN COMPARISON:  Noncontrast exam 12/02/2023.  03/12/2021. FINDINGS: Brain: Diffusion imaging does not show any acute or subacute infarction or other cause of restricted diffusion. Mild chronic small-vessel ischemic change affects the pons. No focal cerebellar finding. Cerebral hemispheres show an old infarction in the left PCA territory affecting the posteromedial temporal lobe, occipital lobe and left thalamus. Old small vessel infarction of the right thalamus. Minimal small vessel change otherwise within the cerebral hemispheric white matter. No evidence of intracranial metastatic disease. After contrast administration, no abnormal enhancement occurs. Vascular: Major vessels at the base of the brain show flow. Skull and upper cervical spine: Negative Sinuses/Orbits: Clear/normal Other: None IMPRESSION: 1. No evidence of intracranial metastatic disease. 2. Old left PCA territory infarction affecting the posteromedial temporal lobe, occipital lobe and left thalamus. Old small vessel infarction of the right thalamus. Minimal small vessel change otherwise within the cerebral hemispheric white matter. Mild chronic small-vessel ischemic change of the pons. Electronically Signed   By: Paulina Fusi M.D.   On: 12/18/2023 11:23   IR IMAGING GUIDED PORT INSERTION Result Date: 12/10/2023 CLINICAL DATA:  Lung cancer, access for chemotherapy EXAM: RIGHT INTERNAL JUGULAR SINGLE LUMEN POWER PORT CATHETER INSERTION Date:  12/09/2023 12/09/2023 4:51 pm Radiologist:  Judie Petit. Ruel Favors, MD Guidance:  Ultrasound and fluoroscopic MEDICATIONS: 1% lidocaine local with epinephrine ANESTHESIA/SEDATION: Versed 1.0 mg IV; Fentanyl 50 mcg IV; Moderate Sedation Time:  31 minutes The patient was continuously monitored during the procedure by the interventional radiology nurse under my direct supervision.  FLUOROSCOPY: 0 minutes, 48 seconds (6 mGy) COMPLICATIONS: None immediate. CONTRAST:  None. PROCEDURE: Informed consent was obtained from the patient following explanation of the procedure, risks, benefits and alternatives. The patient understands, agrees and consents for the procedure. All questions were addressed. A time out was performed. Maximal barrier sterile technique utilized including caps, mask,  sterile gowns, sterile gloves, large sterile drape, hand hygiene, and 2% chlorhexidine scrub. Under sterile conditions and local anesthesia, right internal jugular micropuncture venous access was performed. Access was performed with ultrasound. Images were obtained for documentation of the patent right internal jugular vein. A guide wire was inserted followed by a transitional dilator. This allowed insertion of a guide wire and catheter into the IVC. Measurements were obtained from the SVC / RA junction back to the right IJ venotomy site. In the right infraclavicular chest, a subcutaneous pocket was created over the second anterior rib. This was done under sterile conditions and local anesthesia. 1% lidocaine with epinephrine was utilized for this. A 2.5 cm incision was made in the skin. Blunt dissection was performed to create a subcutaneous pocket over the right pectoralis major muscle. The pocket was flushed with saline vigorously. There was adequate hemostasis. The port catheter was assembled and checked for leakage. The port catheter was secured in the pocket with two retention sutures. The tubing was tunneled subcutaneously to the right venotomy site and inserted into the SVC/RA junction through a valved peel-away sheath. Position was confirmed with fluoroscopy. Images were obtained for documentation. The patient tolerated the procedure well. No immediate complications. Incisions were closed in a two layer fashion with 4 - 0 Vicryl suture. Dermabond was applied to the skin. The port catheter was accessed,  blood was aspirated followed by saline and heparin flushes. Needle was removed. A dry sterile dressing was applied. IMPRESSION: Ultrasound and fluoroscopically guided right internal jugular single lumen power port catheter insertion. Tip in the SVC/RA junction. Catheter ready for use. Electronically Signed   By: Judie Petit.  Shick M.D.   On: 12/10/2023 08:23   MR BRAIN WO CONTRAST Result Date: 12/02/2023 CLINICAL DATA:  73 year old male with non-small cell lung cancer. Possible recurrence in the chest in November. A study without and with contrast was planned but the patient declined postcontrast imaging, declined IV contrast injection. Only noncontrast images are provided. EXAM: MRI HEAD WITHOUT CONTRAST TECHNIQUE: Multiplanar, multiecho pulse sequences of the brain and surrounding structures were obtained without intravenous contrast. 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 Subsequent encephalomalacia including involvement of the mesial temporal lobe, both thalami. Mild associated hemosiderin on SWI which results in DWI artifact. Some generalized cerebral volume loss since 2022. ex vacuo appearing ventricular enlargement. No restricted diffusion or evidence of acute infarction. No midline shift, mass effect, or evidence of intracranial mass lesion. Chronic but increased periventricular white matter T2 and FLAIR hyperintensity since 2022. No other cortical encephalomalacia or chronic cerebral blood products identified. Brainstem and cerebellum remain negative. Vascular: Major intracranial vascular flow voids are stable since 2022. Dominant distal left vertebral artery suspected. Skull and upper cervical spine: Sagittal T1 weighted imaging degraded by motion artifact. Visible bone marrow signal is within normal limits. Sinuses/Orbits: Stable, negative. Other: Mastoids are clear. Grossly normal visible  internal auditory structures. IMPRESSION: 1. The patient declined IV contrast injection. No strong evidence of cerebral metastatic disease on this noncontrast exam. 2. 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 Encephalomalacia and mild hemosiderin. No acute intracranial abnormality identified. Electronically Signed   By: Odessa Fleming M.D.   On: 12/02/2023 09:21

## 2023-12-30 ENCOUNTER — Inpatient Hospital Stay (HOSPITAL_BASED_OUTPATIENT_CLINIC_OR_DEPARTMENT_OTHER): Admitting: Hematology

## 2023-12-30 ENCOUNTER — Inpatient Hospital Stay: Attending: Internal Medicine

## 2023-12-30 ENCOUNTER — Inpatient Hospital Stay

## 2023-12-30 VITALS — BP 126/74 | HR 65 | Temp 97.5°F | Resp 18

## 2023-12-30 DIAGNOSIS — Z9981 Dependence on supplemental oxygen: Secondary | ICD-10-CM | POA: Insufficient documentation

## 2023-12-30 DIAGNOSIS — C3490 Malignant neoplasm of unspecified part of unspecified bronchus or lung: Secondary | ICD-10-CM

## 2023-12-30 DIAGNOSIS — Z8673 Personal history of transient ischemic attack (TIA), and cerebral infarction without residual deficits: Secondary | ICD-10-CM | POA: Insufficient documentation

## 2023-12-30 DIAGNOSIS — Z87891 Personal history of nicotine dependence: Secondary | ICD-10-CM | POA: Diagnosis not present

## 2023-12-30 DIAGNOSIS — Z9221 Personal history of antineoplastic chemotherapy: Secondary | ICD-10-CM | POA: Diagnosis not present

## 2023-12-30 DIAGNOSIS — J439 Emphysema, unspecified: Secondary | ICD-10-CM | POA: Insufficient documentation

## 2023-12-30 DIAGNOSIS — R490 Dysphonia: Secondary | ICD-10-CM | POA: Insufficient documentation

## 2023-12-30 DIAGNOSIS — C771 Secondary and unspecified malignant neoplasm of intrathoracic lymph nodes: Secondary | ICD-10-CM | POA: Insufficient documentation

## 2023-12-30 DIAGNOSIS — Z923 Personal history of irradiation: Secondary | ICD-10-CM | POA: Insufficient documentation

## 2023-12-30 DIAGNOSIS — Z8041 Family history of malignant neoplasm of ovary: Secondary | ICD-10-CM | POA: Diagnosis not present

## 2023-12-30 DIAGNOSIS — G629 Polyneuropathy, unspecified: Secondary | ICD-10-CM | POA: Diagnosis not present

## 2023-12-30 DIAGNOSIS — Z5111 Encounter for antineoplastic chemotherapy: Secondary | ICD-10-CM | POA: Diagnosis not present

## 2023-12-30 DIAGNOSIS — I10 Essential (primary) hypertension: Secondary | ICD-10-CM | POA: Insufficient documentation

## 2023-12-30 DIAGNOSIS — E785 Hyperlipidemia, unspecified: Secondary | ICD-10-CM | POA: Diagnosis not present

## 2023-12-30 DIAGNOSIS — G9389 Other specified disorders of brain: Secondary | ICD-10-CM | POA: Diagnosis not present

## 2023-12-30 DIAGNOSIS — Z87442 Personal history of urinary calculi: Secondary | ICD-10-CM | POA: Insufficient documentation

## 2023-12-30 DIAGNOSIS — C3412 Malignant neoplasm of upper lobe, left bronchus or lung: Secondary | ICD-10-CM | POA: Diagnosis not present

## 2023-12-30 DIAGNOSIS — R109 Unspecified abdominal pain: Secondary | ICD-10-CM | POA: Insufficient documentation

## 2023-12-30 DIAGNOSIS — Z7901 Long term (current) use of anticoagulants: Secondary | ICD-10-CM | POA: Diagnosis not present

## 2023-12-30 DIAGNOSIS — I4891 Unspecified atrial fibrillation: Secondary | ICD-10-CM | POA: Diagnosis not present

## 2023-12-30 DIAGNOSIS — C7801 Secondary malignant neoplasm of right lung: Secondary | ICD-10-CM | POA: Insufficient documentation

## 2023-12-30 DIAGNOSIS — Z79899 Other long term (current) drug therapy: Secondary | ICD-10-CM | POA: Diagnosis not present

## 2023-12-30 DIAGNOSIS — G4730 Sleep apnea, unspecified: Secondary | ICD-10-CM | POA: Insufficient documentation

## 2023-12-30 DIAGNOSIS — Z801 Family history of malignant neoplasm of trachea, bronchus and lung: Secondary | ICD-10-CM | POA: Insufficient documentation

## 2023-12-30 DIAGNOSIS — Z510 Encounter for antineoplastic radiation therapy: Secondary | ICD-10-CM | POA: Diagnosis not present

## 2023-12-30 DIAGNOSIS — I739 Peripheral vascular disease, unspecified: Secondary | ICD-10-CM | POA: Diagnosis not present

## 2023-12-30 DIAGNOSIS — C3411 Malignant neoplasm of upper lobe, right bronchus or lung: Secondary | ICD-10-CM | POA: Diagnosis not present

## 2023-12-30 LAB — CBC WITH DIFFERENTIAL/PLATELET
Abs Immature Granulocytes: 0.03 10*3/uL (ref 0.00–0.07)
Basophils Absolute: 0 10*3/uL (ref 0.0–0.1)
Basophils Relative: 0 %
Eosinophils Absolute: 0.1 10*3/uL (ref 0.0–0.5)
Eosinophils Relative: 2 %
HCT: 40.7 % (ref 39.0–52.0)
Hemoglobin: 12.9 g/dL — ABNORMAL LOW (ref 13.0–17.0)
Immature Granulocytes: 1 %
Lymphocytes Relative: 14 %
Lymphs Abs: 0.8 10*3/uL (ref 0.7–4.0)
MCH: 27.2 pg (ref 26.0–34.0)
MCHC: 31.7 g/dL (ref 30.0–36.0)
MCV: 85.7 fL (ref 80.0–100.0)
Monocytes Absolute: 0.5 10*3/uL (ref 0.1–1.0)
Monocytes Relative: 8 %
Neutro Abs: 4.5 10*3/uL (ref 1.7–7.7)
Neutrophils Relative %: 75 %
Platelets: 204 10*3/uL (ref 150–400)
RBC: 4.75 MIL/uL (ref 4.22–5.81)
RDW: 14.6 % (ref 11.5–15.5)
WBC: 5.9 10*3/uL (ref 4.0–10.5)
nRBC: 0 % (ref 0.0–0.2)

## 2023-12-30 LAB — MAGNESIUM: Magnesium: 1.9 mg/dL (ref 1.7–2.4)

## 2023-12-30 LAB — COMPREHENSIVE METABOLIC PANEL WITH GFR
ALT: 24 U/L (ref 0–44)
AST: 20 U/L (ref 15–41)
Albumin: 3.7 g/dL (ref 3.5–5.0)
Alkaline Phosphatase: 30 U/L — ABNORMAL LOW (ref 38–126)
Anion gap: 7 (ref 5–15)
BUN: 23 mg/dL (ref 8–23)
CO2: 27 mmol/L (ref 22–32)
Calcium: 8.9 mg/dL (ref 8.9–10.3)
Chloride: 102 mmol/L (ref 98–111)
Creatinine, Ser: 1.02 mg/dL (ref 0.61–1.24)
GFR, Estimated: >60 mL/min (ref >60)
Glucose, Bld: 101 mg/dL — ABNORMAL HIGH (ref 70–99)
Potassium: 4.2 mmol/L (ref 3.5–5.1)
Sodium: 136 mmol/L (ref 135–145)
Total Bilirubin: 0.9 mg/dL (ref 0.0–1.2)
Total Protein: 6.6 g/dL (ref 6.5–8.1)

## 2023-12-30 MED ORDER — DEXAMETHASONE SODIUM PHOSPHATE 10 MG/ML IJ SOLN
10.0000 mg | Freq: Once | INTRAMUSCULAR | Status: AC
  Administered 2023-12-30: 10 mg via INTRAVENOUS
  Filled 2023-12-30: qty 1

## 2023-12-30 MED ORDER — FAMOTIDINE IN NACL 20-0.9 MG/50ML-% IV SOLN
20.0000 mg | Freq: Once | INTRAVENOUS | Status: AC
  Administered 2023-12-30: 20 mg via INTRAVENOUS
  Filled 2023-12-30: qty 50

## 2023-12-30 MED ORDER — CETIRIZINE HCL 10 MG/ML IV SOLN
10.0000 mg | Freq: Once | INTRAVENOUS | Status: AC
  Administered 2023-12-30: 10 mg via INTRAVENOUS
  Filled 2023-12-30: qty 1

## 2023-12-30 MED ORDER — SODIUM CHLORIDE 0.9% FLUSH
10.0000 mL | INTRAVENOUS | Status: DC | PRN
  Administered 2023-12-30: 10 mL

## 2023-12-30 MED ORDER — PACLITAXEL CHEMO INJECTION 300 MG/50ML
45.0000 mg/m2 | Freq: Once | INTRAVENOUS | Status: AC
  Administered 2023-12-30: 96 mg via INTRAVENOUS
  Filled 2023-12-30: qty 16

## 2023-12-30 MED ORDER — SODIUM CHLORIDE 0.9 % IV SOLN
INTRAVENOUS | Status: DC
Start: 2023-12-30 — End: 2023-12-30

## 2023-12-30 MED ORDER — PALONOSETRON HCL INJECTION 0.25 MG/5ML
0.2500 mg | Freq: Once | INTRAVENOUS | Status: AC
  Administered 2023-12-30: 0.25 mg via INTRAVENOUS
  Filled 2023-12-30: qty 5

## 2023-12-30 MED ORDER — SODIUM CHLORIDE 0.9 % IV SOLN
223.8000 mg | Freq: Once | INTRAVENOUS | Status: AC
  Administered 2023-12-30: 220 mg via INTRAVENOUS
  Filled 2023-12-30: qty 22

## 2023-12-30 MED ORDER — HEPARIN SOD (PORK) LOCK FLUSH 100 UNIT/ML IV SOLN
500.0000 [IU] | Freq: Once | INTRAVENOUS | Status: AC | PRN
Start: 2023-12-30 — End: 2023-12-30
  Administered 2023-12-30: 500 [IU]

## 2023-12-30 NOTE — Patient Instructions (Signed)
 [facility] [place] - A DEPT OF [facility]  Discharge Instructions: Thank you for choosing [facility] to provide your oncology and hematology care.  If you have a lab appointment with the Cancer Center - please note that after April 8th, 2024, all labs will be drawn in the cancer center.  You do not have to check in or register with the main entrance as you have in the past but will complete your check-in in the cancer center.  Wear comfortable clothing and clothing appropriate for easy access to any Portacath or PICC line.   We strive to give you quality time with your provider. You may need to reschedule your appointment if you arrive late (15 or more minutes).  Arriving late affects you and other patients whose appointments are after yours.  Also, if you miss three or more appointments without notifying the office, you may be dismissed from the clinic at the provider's discretion.      For prescription refill requests, have your pharmacy contact our office and allow 72 hours for refills to be completed.    Today you received the following chemotherapy and/or immunotherapy agents Taxol and Carboplatin, return as scheduled.   To help prevent nausea and vomiting after your treatment, we encourage you to take your nausea medication as directed.  BELOW ARE SYMPTOMS THAT SHOULD BE REPORTED IMMEDIATELY: *FEVER GREATER THAN 100.4 F (38 C) OR HIGHER *CHILLS OR SWEATING *NAUSEA AND VOMITING THAT IS NOT CONTROLLED WITH YOUR NAUSEA MEDICATION *UNUSUAL SHORTNESS OF BREATH *UNUSUAL BRUISING OR BLEEDING *URINARY PROBLEMS (pain or burning when urinating, or frequent urination) *BOWEL PROBLEMS (unusual diarrhea, constipation, pain near the anus) TENDERNESS IN MOUTH AND THROAT WITH OR WITHOUT PRESENCE OF ULCERS (sore throat, sores in mouth, or a toothache) UNUSUAL RASH, SWELLING OR PAIN  UNUSUAL VAGINAL DISCHARGE OR ITCHING   Items with * indicate a potential  emergency and should be followed up as soon as possible or go to the Emergency Department if any problems should occur.  Please show the CHEMOTHERAPY ALERT CARD or IMMUNOTHERAPY ALERT CARD at check-in to the Emergency Department and triage nurse.  Should you have questions after your visit or need to cancel or reschedule your appointment, please contact [facility] [place] - A DEPT OF [facility] [phone]  and follow the prompts.  Office hours are 8:00 a.m. to 4:30 p.m. Monday - Friday. Please note that voicemails left after 4:00 p.m. may not be returned until the following business day.  We are closed weekends and major holidays. You have access to a nurse at all times for urgent questions. Please call the main number to the clinic [phone] and follow the prompts.  For any non-urgent questions, you may also contact your provider using MyChart. We now offer e-Visits for anyone 64 and older to request care online for non-urgent symptoms. For details visit [email or web address].   Also download the MyChart app! Go to the app store, search "MyChart", open the app, select [place], and log in with your MyChart username and password.

## 2023-12-30 NOTE — Progress Notes (Signed)
 Patient has been examined by Dr. Ellin Saba. Vital signs and labs have been reviewed by MD - ANC, Creatinine, LFTs, hemoglobin, and platelets are within treatment parameters per M.D. - pt may proceed with treatment.  Primary RN and pharmacy notified.

## 2023-12-30 NOTE — Progress Notes (Signed)
 Patient presents today for treatment. Patient and labs accessed by Dr. Ellin Saba, patient okay for treatment.  Patient tolerated chemotherapy with no complaints voiced. Side effects with management reviewed understanding verbalized. Port site clean and dry with no bruising or swelling noted at site. Good blood return noted before and after administration of chemotherapy. Band aid applied. Patient left in satisfactory condition with VSS and no s/s of distress noted.

## 2023-12-30 NOTE — Addendum Note (Signed)
 Encounter addended by: Edward Qualia on: 12/30/2023 2:07 PM  Actions taken: Imaging Exam ended

## 2023-12-31 ENCOUNTER — Ambulatory Visit: Payer: Medicare Other

## 2023-12-31 DIAGNOSIS — Z510 Encounter for antineoplastic radiation therapy: Secondary | ICD-10-CM | POA: Diagnosis not present

## 2023-12-31 DIAGNOSIS — C771 Secondary and unspecified malignant neoplasm of intrathoracic lymph nodes: Secondary | ICD-10-CM | POA: Diagnosis not present

## 2023-12-31 DIAGNOSIS — I10 Essential (primary) hypertension: Secondary | ICD-10-CM | POA: Diagnosis not present

## 2023-12-31 DIAGNOSIS — E785 Hyperlipidemia, unspecified: Secondary | ICD-10-CM | POA: Diagnosis not present

## 2023-12-31 DIAGNOSIS — Z8673 Personal history of transient ischemic attack (TIA), and cerebral infarction without residual deficits: Secondary | ICD-10-CM | POA: Diagnosis not present

## 2023-12-31 DIAGNOSIS — C3411 Malignant neoplasm of upper lobe, right bronchus or lung: Secondary | ICD-10-CM | POA: Diagnosis not present

## 2024-01-01 ENCOUNTER — Inpatient Hospital Stay: Admitting: Dietician

## 2024-01-01 ENCOUNTER — Telehealth: Payer: Self-pay | Admitting: Dietician

## 2024-01-01 DIAGNOSIS — C3411 Malignant neoplasm of upper lobe, right bronchus or lung: Secondary | ICD-10-CM | POA: Diagnosis not present

## 2024-01-01 DIAGNOSIS — Z510 Encounter for antineoplastic radiation therapy: Secondary | ICD-10-CM | POA: Diagnosis not present

## 2024-01-01 DIAGNOSIS — Z8673 Personal history of transient ischemic attack (TIA), and cerebral infarction without residual deficits: Secondary | ICD-10-CM | POA: Diagnosis not present

## 2024-01-01 DIAGNOSIS — I10 Essential (primary) hypertension: Secondary | ICD-10-CM | POA: Diagnosis not present

## 2024-01-01 DIAGNOSIS — C771 Secondary and unspecified malignant neoplasm of intrathoracic lymph nodes: Secondary | ICD-10-CM | POA: Diagnosis not present

## 2024-01-01 DIAGNOSIS — E785 Hyperlipidemia, unspecified: Secondary | ICD-10-CM | POA: Diagnosis not present

## 2024-01-01 NOTE — Telephone Encounter (Signed)
 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 S/p resection by Dr. Dorris Fetch (2017). Patient receiving concurrent chemoradiation with weekly carbo/taxol (first 3/25). Patient is under the care of Dr. Ellin Saba.   Spoke with wife of patient Randall Sullivan) for nutrition follow-up. She reports pt doing well overall other than feeling more tired. His appetite is unchanged. Eating 2 good meals and snacks in between. Pt did try Ensure samples and liked these.   Medications: reviewed  Labs: reviewed   Anthropometrics: Last wt 218 lb 0.6 oz on 4/1 slightly decreased from 220 lb 6.4 oz on 3/25  2/26 - 216 lb 12.8 oz    NUTRITION DIAGNOSIS: Unintended wt loss ongoing   INTERVENTION:  Encourage high protein high calorie snacks in between meals Continue drinking one Ensure Complete - will leave samples for pt to pick up at 4/8 appointments     MONITORING, EVALUATION, GOAL: wt trends, intake   NEXT VISIT: Monday April 28 via telephone

## 2024-01-02 DIAGNOSIS — I10 Essential (primary) hypertension: Secondary | ICD-10-CM | POA: Diagnosis not present

## 2024-01-02 DIAGNOSIS — C771 Secondary and unspecified malignant neoplasm of intrathoracic lymph nodes: Secondary | ICD-10-CM | POA: Diagnosis not present

## 2024-01-02 DIAGNOSIS — E785 Hyperlipidemia, unspecified: Secondary | ICD-10-CM | POA: Diagnosis not present

## 2024-01-02 DIAGNOSIS — Z8673 Personal history of transient ischemic attack (TIA), and cerebral infarction without residual deficits: Secondary | ICD-10-CM | POA: Diagnosis not present

## 2024-01-02 DIAGNOSIS — C3411 Malignant neoplasm of upper lobe, right bronchus or lung: Secondary | ICD-10-CM | POA: Diagnosis not present

## 2024-01-02 DIAGNOSIS — Z510 Encounter for antineoplastic radiation therapy: Secondary | ICD-10-CM | POA: Diagnosis not present

## 2024-01-05 ENCOUNTER — Ambulatory Visit

## 2024-01-05 DIAGNOSIS — I10 Essential (primary) hypertension: Secondary | ICD-10-CM | POA: Diagnosis not present

## 2024-01-05 DIAGNOSIS — Z510 Encounter for antineoplastic radiation therapy: Secondary | ICD-10-CM | POA: Diagnosis not present

## 2024-01-05 DIAGNOSIS — C771 Secondary and unspecified malignant neoplasm of intrathoracic lymph nodes: Secondary | ICD-10-CM | POA: Diagnosis not present

## 2024-01-05 DIAGNOSIS — E785 Hyperlipidemia, unspecified: Secondary | ICD-10-CM | POA: Diagnosis not present

## 2024-01-05 DIAGNOSIS — Z8673 Personal history of transient ischemic attack (TIA), and cerebral infarction without residual deficits: Secondary | ICD-10-CM | POA: Diagnosis not present

## 2024-01-05 DIAGNOSIS — C3411 Malignant neoplasm of upper lobe, right bronchus or lung: Secondary | ICD-10-CM | POA: Diagnosis not present

## 2024-01-05 NOTE — Progress Notes (Signed)
 [facility] [address] [place] [address]   Clinic Day:  01/06/2024  Referring physician: Anabel Halon, MD  Patient Care Team: Anabel Halon, MD as PCP - General (Internal Medicine) [name], MD as Medical Oncologist (Medical Oncology) Therese Sarah, RN as Oncology Nurse Navigator (Medical Oncology)   ASSESSMENT & PLAN:   Assessment:  1.  Recurrent adenocarcinoma of the lung: - Stage Ia (T1b N0 M0) adenocarcinoma of the left upper lobe, s/p left VATS, left upper lobectomy with mediastinal lymph node dissection by Dr. Dorris Fetch on 08/01/2016 - Stage Ia moderately differentiated adenocarcinoma, 0.7 cm of the right upper lobe, s/p right VATS with wedge resection of right upper lobe nodule and bleb resection by Dr. Dorris Fetch on 12/09/2018 - PET scan (08/19/2023): Dysregulation artifact degrades image quality.  Recurrent bronchogenic carcinoma in the medial right upper lobe along the resection margin with no evidence of distant metastatic disease.  Faint 6 mm nodule in the superior segment right lower lobe too small for PET resolution.  Focal hypermetabolism in the central prostate. - CT super D chest (10/21/2023): Solid 2.6 cm central right upper lobe pulmonary nodule along the wedge resection suture line.  Solid 1.3 cm and 0.7 cm peripheral right lower lobe lung nodules. - MRI brain (12/02/2023): Without IV contrast with no strong evidence of cerebral metastatic disease. - Right paratracheal lymph node FNA (10/24/2023): Adenocarcinoma - Foundation 1 CDX: Insufficient tissue - PD-L1 22 C3 (TPS): 5% - Chemoradiation therapy with weekly carboplatin and paclitaxel started on 12/23/2023  2.  Social/family history: - He lives at home with his wife [name].  He had a stroke in 2022 which has caused short-term memory loss, loss of right-sided peripheral vision and balance problems.  He currently walks with a cane and sometimes walker.  He is also on continuous  oxygen since hospitalization in December 2024.  Most of the day he spends time sitting in the chair.  He can wash dishes but does require some help with some of the ADLs.  Quit smoking in 2017. - Mother and sister had ovarian cancer.  Half brother had lung cancer.   Plan:  1.  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 - Denied any GI side effects.  However reported more tiredness since last treatment (week 2). - Reviewed labs today: Normal LFTs and creatinine.  CBC grossly normal. - Proceed with week 3 of carboplatin and paclitaxel today without any dose modifications.  RTC 1 week for follow-up.  2.  Hoarseness: - He reported hoarseness since December and it is worse in the afternoons.  We have sent a referral to ENT but he would like to be evaluated after completion of chemoradiation.  3.  Peripheral neuropathy: - Numbness in the fingertips of both hands, right more than left since stroke has been stable.  No worsening since chemotherapy started.   No orders of the defined types were placed in this encounter.     [name],acting as a Neurosurgeon for [name], MD.,have documented all relevant documentation on the behalf of [name], MD,as directed by  [name], MD while in the presence of [name], MD.  I, [name] MD, have reviewed the above documentation for accuracy and completeness, and I agree with the above.     [name], MD   4/8/202510:14 AM  CHIEF COMPLAINT/PURPOSE OF CONSULT:   Diagnosis: Recurrent stage III  adenocarcinoma   Cancer Staging  Primary lung adenocarcinoma (HCC) Staging form: Lung, AJCC 8th Edition - Clinical: Stage IA2 (cT1b, cN0, cM0) - Signed by [name], MD on 12/04/2020  Recurrent non-small cell lung cancer (NSCLC) (HCC) Staging form: Lung, AJCC V9 - Clinical stage from 12/03/2023: Stage IA3 (rcT1c,  cN0, cM0) - Unsigned    Prior Therapy: Left upper lobectomy (2017) and right upper lobectomy (2020)  Current Therapy: Concurrent chemoradiation therapy   HISTORY OF PRESENT ILLNESS:   Oncology History  Primary lung adenocarcinoma (HCC)  10/15/2016 Initial Diagnosis   Primary lung adenocarcinoma (HCC)   12/04/2020 Cancer Staging   Staging form: Lung, AJCC 8th Edition - Clinical: Stage IA2 (cT1b, cN0, cM0) - Signed by [name], MD on 12/04/2020   Recurrent non-small cell lung cancer (NSCLC) (HCC)  12/03/2023 Initial Diagnosis   Recurrent non-small cell lung cancer (NSCLC) (HCC)   12/23/2023 -  Chemotherapy   Patient is on Treatment Plan : LUNG Carboplatin + Paclitaxel + XRT q7d         Deamonte is a 73 y.o. male presenting to clinic today for evaluation of recurrent adenocarcinoma of right lung, non small cell type at the request of Dr. Arbutus Ped.  Lennox was first diagnosed in 2017 with Stage IA (T1b, N0, M0) non-small cell lung cancer, well-differentiated adenocarcinoma presented with left upper lobe pulmonary nodule. He then underwent left VATS and left upper lobectomy with mediastinal lymph node dissection under Dr. Dorris Fetch on 08/01/2016. Sabas has since been under surveillance for left lung adenocarcinoma.  He was diagnosed with stage Ia non-small cell lung cancer in March 2020 and underwent right VATS with wedge resection of right upper lobe nodule and bleb resection under Dr. Dorris Fetch on 12/10/2018. He was under observation until a CT scan on 07/08/23 found an increasing mass like soft tissue along the suture margin in the central right upper lobe. PET on 08/19/23 found: recurrent bronchogenic carcinoma in the medial right upper lobe along the resection margin with faint 0.6 cm nodule in the superior segment of the right lower lobe too small for PET resolution.   Tabor then had a bronchoscopy on 10/24/23 with right paratracheal lymph node biopsy and endobronchial valve replacement in  the anterior apical, posterior segments of right upper lobe, and superior segment of right lower lobe. Pathology of lymph node biopsy showed: adenocarcinoma. Molecular pathology showed a PDL1 score of 5%.   His cancer is now considered Stage III NSCLC adenocarcinoma due to right paratracheal lymph node involvement. Weiland had discussion of treatment with Dr. Arbutus Ped on 11/26/23 that consisted of weekly mild chemotherapy with adjunct radiation at [facility] with their side effects.   MRI brain done on 12/02/23 showed: No strong evidence of cerebral metastatic disease on this noncontrast exam. 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 Encephalomalacia and mild hemosiderin. No acute intracranial abnormality identified.  Gabrien has a history of stroke, with symptoms of fatigue, weakness, vision changes, and hoarseness. He also has pertinent history of COPD and requires at home oxygen.  Today, he states that he is doing well overall. His appetite level is at 100%. His energy level is at 50%.  INTERVAL HISTORY:   GARION WEMPE is a 73 y.o. male presenting to the clinic today for follow-up of recurrent stage III NSCLC. He was last seen by me on 12/30/23.  Today, he states that he is doing well overall. His appetite level is at 100%. His energy level is at 75%.  He denies any dysphagia, nausea, vomiting, or diarrhea due to last treatment. Numbness in the hands is stable. Lucion does note decreased energy levels after last treatment, that has mildly worsened this week and since his prior treatment.   Momin also notes wheezing and coughing yesterday while receiving radiation. He does use 2 inhalers at home.   He has been called by ENT for hoarseness but has not yet made an appointment.   PAST MEDICAL HISTORY:   Past Medical History: Past Medical History:  Diagnosis Date   A-fib (HCC) 2017   after last lung surgery patient had a history if Afib documented in hospital    AAA (abdominal aortic  aneurysm) (HCC)    4.0 cm 09/17/22 [place]   Acute appendicitis 07/14/2021   Arthritis    back & legs ( knees)   Cancer (HCC)    lung   Complication of anesthesia    agitated upon waking from anesth.    Dyspnea    Dysrhythmia    A. Fib   Emphysema    Emphysema of lung (HCC)    History of kidney stones    Hyperlipidemia    Hypertension    Oxygen dependent    2L continuous   Peripheral vascular disease (HCC)    Aortic Aneurysm   Pneumonia    Pre-diabetes    Sleep apnea    No OSA, but wears 2L [place] O2 continuous including at night   Stroke [facility]) 2022   Right sided weakness, decreased hearing, balance and memory loss   Tobacco abuse     Surgical History: Past Surgical History:  Procedure Laterality Date   HERNIA REPAIR Bilateral 1999   umbilical   IR IMAGING GUIDED PORT INSERTION  12/09/2023   LAPAROSCOPIC APPENDECTOMY N/A 07/15/2021   Procedure: APPENDECTOMY LAPAROSCOPIC;  Surgeon: Lucretia Roers, MD;  Location: [facility];  Service: General;  Laterality: N/A;   VIDEO ASSISTED THORACOSCOPY Right 12/21/2018   Procedure: VIDEO ASSISTED THORACOSCOPY AND RESECTION OF BLEBS RIGHT LOWER LOBE;  Surgeon: Loreli Slot, MD;  Location: [facility] OR;  Service: Thoracic;  Laterality: Right;   VIDEO ASSISTED THORACOSCOPY (VATS)/ LOBECTOMY Left 08/01/2016   Procedure: VIDEO ASSISTED THORACOSCOPY (VATS)/ LEFT UPPER LOBECTOMY with lymph node sampling and onQ placement;  Surgeon: Loreli Slot, MD;  Location: [facility] OR;  Service: Thoracic;  Laterality: Left;   VIDEO ASSISTED THORACOSCOPY (VATS)/WEDGE RESECTION Right 12/09/2018   Procedure: VIDEO ASSISTED THORACOSCOPY (VATS)/WEDGE RESECTION;  Surgeon: Loreli Slot, MD;  Location: [facility] OR;  Service: Thoracic;  Laterality: Right;   VIDEO BRONCHOSCOPY Bilateral 12/21/2018   Procedure: VIDEO BRONCHOSCOPY;  Surgeon: Loreli Slot, MD;  Location: [facility] OR;  Service: Thoracic;  Laterality: Bilateral;   VIDEO BRONCHOSCOPY WITH ENDOBRONCHIAL  NAVIGATION N/A 07/03/2016   Procedure: VIDEO BRONCHOSCOPY WITH ENDOBRONCHIAL NAVIGATION;  Surgeon: Loreli Slot, MD;  Location: [facility] OR;  Service: Thoracic;  Laterality: N/A;   VIDEO BRONCHOSCOPY WITH ENDOBRONCHIAL NAVIGATION N/A 10/24/2023   Procedure: VIDEO BRONCHOSCOPY WITH ENDOBRONCHIAL NAVIGATION;  Surgeon: Loreli Slot, MD;  Location: [facility] OR;  Service: Thoracic;  Laterality: N/A;   VIDEO BRONCHOSCOPY WITH ENDOBRONCHIAL ULTRASOUND N/A 10/24/2023   Procedure: VIDEO BRONCHOSCOPY WITH ENDOBRONCHIAL ULTRASOUND;  Surgeon: Loreli Slot, MD;  Location: [facility] OR;  Service: Thoracic;  Laterality: N/A;   VIDEO BRONCHOSCOPY WITH INSERTION OF INTERBRONCHIAL VALVE (IBV) N/A 12/11/2018   Procedure: VIDEO BRONCHOSCOPY WITH INSERTION OF INTERBRONCHIAL VALVE (IBV);  Surgeon: Loreli Slot, MD;  Location: [facility] OR;  Service: Thoracic;  Laterality: N/A;   VIDEO BRONCHOSCOPY WITH INSERTION OF INTERBRONCHIAL VALVE (IBV) N/A 02/10/2019   Procedure: VIDEO BRONCHOSCOPY WITH REMOVAL OF INTERBRONCHIAL VALVE (IBV);  Surgeon: Loreli Slot, MD;  Location: [facility] OR;  Service: Thoracic;  Laterality: N/A;    Social History: Social History   Socioeconomic History   Marital status: Married    Spouse name: Not on file   Number of children: 2   Years of education: Not on file   Highest education level: 9th grade  Occupational History   Occupation: Vinyl siding   Occupation: Retired  Tobacco Use   Smoking status: Former    Current packs/day: 0.00    Average packs/day: 1 pack/day for 40.0 years (40.0 ttl pk-yrs)    Types: Cigarettes    Start date: 05/28/1976    Quit date: 05/28/2016    Years since quitting: 7.6   Smokeless tobacco: Never  Vaping Use   Vaping status: Never Used  Substance and Sexual Activity   Alcohol use: Not Currently    Comment: quit- 2016   Drug use: No   Sexual activity: Not Currently  Other Topics Concern   Not on file  Social History Narrative   Married for  20 years,second.Lives with wife. 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   Social Drivers of Corporate investment banker Strain: High Risk (12/10/2023)   Overall Financial Resource Strain (CARDIA)    Difficulty of Paying Living Expenses: Very hard  Food Insecurity: No Food Insecurity (09/17/2023)   Hunger Vital Sign    Worried About Running Out of Food in the Last Year: Never true    Ran Out of Food in the Last Year: Never true  Transportation Needs: No Transportation Needs (09/17/2023)   PRAPARE - Administrator, Civil Service (Medical): No    Lack of Transportation (Non-Medical): No  Physical Activity: Insufficiently Active (07/11/2023)   Exercise Vital Sign    Days of Exercise per Week: 3 days    Minutes of Exercise per Session: 10 min  Stress: Patient Declined (07/11/2023)   [employer] of Occupational Health - Occupational Stress Questionnaire    Feeling of Stress : Patient declined  Social Connections: Unknown (07/11/2023)   Social Connection and Isolation Panel [NHANES]    Frequency of Communication with Friends and Family: Twice a week    Frequency of Social Gatherings with Friends and Family: Once a week    Attends Religious Services: Patient declined    Database administrator or Organizations: No    Attends Engineer, structural: Not on file    Marital Status: Married  Intimate Partner Violence: Patient Unable To Answer (09/17/2023)   Humiliation, Afraid, Rape, and Kick questionnaire    Fear of Current or Ex-Partner: Patient unable to answer    Emotionally Abused: Patient unable to answer    Physically Abused: Patient unable to answer    Sexually Abused: Patient unable to answer    Family History: Family History  Problem Relation Age of Onset   Cancer Mother    Cancer Sister    Aneurysm Sister    Kidney disease Paternal Aunt    Asthma Brother    Cancer - Lung Brother    Cancer Brother    Arthritis Brother     Current  Medications:  Current Outpatient Medications:    acetaminophen (TYLENOL) 325 MG tablet, Take 2 tablets (650 mg total) by mouth every 6 (six) hours as needed for mild pain (or Fever >/= 101)., Disp: ,  Rfl:    ALPRAZolam (XANAX) 0.5 MG tablet, Take 0.5 mg by mouth daily., Disp: , Rfl:    Budeson-Glycopyrrol-Formoterol (BREZTRI AEROSPHERE) 160-9-4.8 MCG/ACT AERO, Inhale 2 puffs into the lungs in the morning and at bedtime., Disp: , Rfl:    cetirizine (ZYRTEC) 10 MG tablet, Take 10 mg by mouth daily., Disp: , Rfl:    Cholecalciferol (VITAMIN D3) 125 MCG (5000 UT) CAPS, Take 1 capsule (5,000 Units total) by mouth daily., Disp: 30 capsule, Rfl: 0   diltiazem (CARDIZEM CD) 180 MG 24 hr capsule, Take 1 capsule (180 mg total) by mouth daily., Disp: 90 capsule, Rfl: 3   fenofibrate (TRICOR) 145 MG tablet, Take 1 tablet (145 mg total) by mouth daily., Disp: 90 tablet, Rfl: 1   finasteride (PROSCAR) 5 MG tablet, Take 1 tablet (5 mg total) by mouth daily., Disp: 30 tablet, Rfl: 3   guaiFENesin (MUCINEX) 600 MG 12 hr tablet, Take by mouth 2 (two) times daily., Disp: , Rfl:    ipratropium-albuterol (DUONEB) 0.5-2.5 (3) MG/3ML SOLN, USE 1 AMPULE IN NEBULIZER 4 TIMES DAILY AND AS NEEDED FOR SHORTNESS OF BREATH AND FOR WHEEZING, Disp: 450 mL, Rfl: 0   lidocaine-prilocaine (EMLA) cream, Apply to affected area once, Disp: 30 g, Rfl: 3   meclizine (ANTIVERT) 25 MG tablet, Take 1 tablet (25 mg total) by mouth 3 (three) times daily as needed for dizziness., Disp: 30 tablet, Rfl: 0   metoprolol succinate (TOPROL XL) 50 MG 24 hr tablet, Take 1 tablet (50 mg total) by mouth daily. Take with or immediately following a meal., Disp: 30 tablet, Rfl: 2   Omega-3 Fatty Acids (FISH OIL ULTRA PO), Take 1,200 mg by mouth in the morning and at bedtime., Disp: , Rfl:    OXYGEN, Inhale 2 L into the lungs continuous. Hx of lung ca, copd, and emphysema, Disp: , Rfl:    Palonosetron HCl (ALOXI IV), Inject into the vein., Disp: , Rfl:     pantoprazole (PROTONIX) 40 MG tablet, Take 1 tablet (40 mg total) by mouth daily., Disp: 30 tablet, Rfl: 1   polyethylene glycol (MIRALAX / GLYCOLAX) 17 g packet, Take 17 g by mouth daily as needed for mild constipation., Disp: , Rfl:    prochlorperazine (COMPAZINE) 10 MG tablet, Take 1 tablet (10 mg total) by mouth every 6 (six) hours as needed for nausea or vomiting., Disp: 60 tablet, Rfl: 3   rosuvastatin (CRESTOR) 40 MG tablet, Take 1 tablet (40 mg total) by mouth daily., Disp: 90 tablet, Rfl: 3   tamsulosin (FLOMAX) 0.4 MG CAPS capsule, Take 2 capsules (0.8 mg total) by mouth daily., Disp: 180 capsule, Rfl: 3   warfarin (COUMADIN) 5 MG tablet, Take 1 tablet (5 mg total) by mouth daily. (Patient taking differently: Take 2.5-5 mg by mouth See admin instructions. Take 2.5 mg by mouth Tuesday and Friday and take 5 mg on Monday, Wednesday, Thursday, Saturday and Sunday), Disp: 90 tablet, Rfl: 1 No current facility-administered medications for this visit.  Facility-Administered Medications Ordered in Other Visits:    0.9 %  sodium chloride infusion, , Intravenous, Continuous, [name], MD, Last Rate: 10 mL/hr at 01/06/24 0949, New Bag at 01/06/24 0949   CARBOplatin (PARAPLATIN) 230 mg in sodium chloride 0.9 % 100 mL chemo infusion, 230 mg, Intravenous, Once, [name], MD   famotidine (PEPCID) IVPB 20 mg premix, 20 mg, Intravenous, Once, [name], MD, Last Rate: 200 mL/hr at 01/06/24 1013, 20 mg at 01/06/24 1013   heparin lock  flush 100 unit/mL, 500 Units, Intracatheter, Once PRN, [name], MD   PACLitaxel (TAXOL) 96 mg in sodium chloride 0.9 % 250 mL chemo infusion (</= 80mg /m2), 45 mg/m2 (Treatment Plan Recorded), Intravenous, Once, [name], MD   sodium chloride flush (NS) 0.9 % injection 10 mL, 10 mL, Intracatheter, PRN, [name], MD   Allergies: No Known Allergies  REVIEW OF SYSTEMS:   Review of Systems  Constitutional:   Negative for chills, fatigue and fever.  HENT:   Negative for lump/mass, mouth sores, nosebleeds, sore throat and trouble swallowing.   Eyes:  Negative for eye problems.  Respiratory:  Positive for wheezing. Negative for cough and shortness of breath.   Cardiovascular:  Negative for chest pain, leg swelling and palpitations.  Gastrointestinal:  Positive for constipation and nausea. Negative for abdominal pain, diarrhea and vomiting.  Genitourinary:  Negative for bladder incontinence, difficulty urinating, dysuria, frequency, hematuria and nocturia.   Musculoskeletal:  Negative for arthralgias, back pain, flank pain, myalgias and neck pain.  Skin:  Negative for itching and rash.  Neurological:  Positive for dizziness and numbness (in hands). Negative for headaches.  Hematological:  Does not bruise/bleed easily.  Psychiatric/Behavioral:  Positive for sleep disturbance. Negative for depression and suicidal ideas. The patient is not nervous/anxious.   All other systems reviewed and are negative.    VITALS:   There were no vitals taken for this visit.  Wt Readings from Last 3 Encounters:  01/06/24 218 lb 7.6 oz (99.1 kg)  12/30/23 218 lb 0.6 oz (98.9 kg)  12/23/23 220 lb 6.4 oz (100 kg)    There is no height or weight on file to calculate BMI.  Performance status (ECOG): 1 - Symptomatic but completely ambulatory  PHYSICAL EXAM:   Physical Exam Vitals and nursing note reviewed. Exam conducted with a chaperone present.  Constitutional:      Appearance: Normal appearance.  Cardiovascular:     Rate and Rhythm: Normal rate and regular rhythm.     Pulses: Normal pulses.     Heart sounds: Normal heart sounds.  Pulmonary:     Effort: Pulmonary effort is normal.     Breath sounds: Normal breath sounds.  Abdominal:     Palpations: Abdomen is soft. There is no hepatomegaly, splenomegaly or mass.     Tenderness: There is no abdominal tenderness.  Musculoskeletal:     Right lower leg: No  edema.     Left lower leg: No edema.  Lymphadenopathy:     Cervical: No cervical adenopathy.     Right cervical: No superficial, deep or posterior cervical adenopathy.    Left cervical: No superficial, deep or posterior cervical adenopathy.     Upper Body:     Right upper body: No supraclavicular or axillary adenopathy.     Left upper body: No supraclavicular or axillary adenopathy.  Neurological:     General: No focal deficit present.     Mental Status: He is alert and oriented to person, place, and time.  Psychiatric:        Mood and Affect: Mood normal.        Behavior: Behavior normal.     LABS:   CBC    Component Value Date/Time   WBC 4.7 01/06/2024 0845   RBC 4.64 01/06/2024 0845   HGB 12.5 (L) 01/06/2024 0845   HGB 13.6 11/26/2023 1040   HGB 14.8 07/15/2023 1142   HGB 13.5 04/09/2017 1249   HCT 40.1 01/06/2024 0845   HCT 48.2  07/15/2023 1142   HCT 42.4 04/09/2017 1249   PLT 161 01/06/2024 0845   PLT 197 11/26/2023 1040   PLT 205 07/15/2023 1142   MCV 86.4 01/06/2024 0845   MCV 86 07/15/2023 1142   MCV 84.8 04/09/2017 1249   MCH 26.9 01/06/2024 0845   MCHC 31.2 01/06/2024 0845   RDW 14.9 01/06/2024 0845   RDW 13.1 07/15/2023 1142   RDW 15.4 (H) 04/09/2017 1249   LYMPHSABS 0.5 (L) 01/06/2024 0845   LYMPHSABS 1.1 07/31/2021 1026   LYMPHSABS 1.0 04/09/2017 1249   MONOABS 0.5 01/06/2024 0845   MONOABS 1.9 (H) 04/09/2017 1249   EOSABS 0.0 01/06/2024 0845   EOSABS 0.1 07/31/2021 1026   BASOSABS 0.0 01/06/2024 0845   BASOSABS 0.0 07/31/2021 1026   BASOSABS 0.0 04/09/2017 1249    CMP    Component Value Date/Time   NA 135 01/06/2024 0845   NA 140 07/15/2023 1142   NA 136 04/09/2017 1249   K 4.1 01/06/2024 0845   K 4.6 04/09/2017 1249   CL 101 01/06/2024 0845   CO2 26 01/06/2024 0845   CO2 27 04/09/2017 1249   GLUCOSE 100 (H) 01/06/2024 0845   GLUCOSE 103 04/09/2017 1249   BUN 18 01/06/2024 0845   BUN 13 07/15/2023 1142   BUN 23.9 04/09/2017 1249    CREATININE 0.94 01/06/2024 0845   CREATININE 1.10 11/26/2023 1040   CREATININE 1.21 (H) 05/08/2020 1051   CREATININE 1.4 (H) 04/09/2017 1249   CALCIUM 8.8 (L) 01/06/2024 0845   CALCIUM 9.6 04/09/2017 1249   PROT 6.5 01/06/2024 0845   PROT 6.9 03/04/2023 1515   PROT 7.8 04/09/2017 1249   ALBUMIN 3.6 01/06/2024 0845   ALBUMIN 4.6 03/04/2023 1515   ALBUMIN 3.6 04/09/2017 1249   AST 22 01/06/2024 0845   AST 20 11/26/2023 1040   AST 13 04/09/2017 1249   ALT 26 01/06/2024 0845   ALT 19 11/26/2023 1040   ALT 9 04/09/2017 1249   ALKPHOS 30 (L) 01/06/2024 0845   ALKPHOS 58 04/09/2017 1249   BILITOT 1.0 01/06/2024 0845   BILITOT 0.7 11/26/2023 1040   BILITOT 1.01 04/09/2017 1249   GFRNONAA >60 01/06/2024 0845   GFRNONAA >60 11/26/2023 1040   GFRNONAA 60 05/08/2020 1051   GFRAA >60 06/02/2020 1239   GFRAA 70 05/08/2020 1051     No results found for: "CEA1", "CEA" / No results found for: "CEA1", "CEA" Lab Results  Component Value Date   PSA1 3.5 07/05/2021   No results found for: "ZOX096" No results found for: "CAN125"  No results found for: "TOTALPROTELP", "ALBUMINELP", "A1GS", "A2GS", "BETS", "BETA2SER", "GAMS", "MSPIKE", "SPEI" No results found for: "TIBC", "FERRITIN", "IRONPCTSAT" No results found for: "LDH"   STUDIES:   MR BRAIN W WO CONTRAST Result Date: 12/18/2023 CLINICAL DATA:  Non-small cell lung cancer.  Staging. EXAM: MRI HEAD WITHOUT AND WITH CONTRAST TECHNIQUE: Multiplanar, multiecho pulse sequences of the brain and surrounding structures were obtained without and with intravenous contrast. CONTRAST:  10mL GADAVIST GADOBUTROL 1 MMOL/ML IV SOLN COMPARISON:  Noncontrast exam 12/02/2023.  03/12/2021. FINDINGS: Brain: Diffusion imaging does not show any acute or subacute infarction or other cause of restricted diffusion. Mild chronic small-vessel ischemic change affects the pons. No focal cerebellar finding. Cerebral hemispheres show an old infarction in the left PCA  territory affecting the posteromedial temporal lobe, occipital lobe and left thalamus. Old small vessel infarction of the right thalamus. Minimal small vessel change otherwise within the cerebral hemispheric white matter. No  evidence of intracranial metastatic disease. After contrast administration, no abnormal enhancement occurs. Vascular: Major vessels at the base of the brain show flow. Skull and upper cervical spine: Negative Sinuses/Orbits: Clear/normal Other: None IMPRESSION: 1. No evidence of intracranial metastatic disease. 2. Old left PCA territory infarction affecting the posteromedial temporal lobe, occipital lobe and left thalamus. Old small vessel infarction of the right thalamus. Minimal small vessel change otherwise within the cerebral hemispheric white matter. Mild chronic small-vessel ischemic change of the pons. Electronically Signed   By: Paulina Fusi M.D.   On: 12/18/2023 11:23   IR IMAGING GUIDED PORT INSERTION Result Date: 12/10/2023 CLINICAL DATA:  Lung cancer, access for chemotherapy EXAM: RIGHT INTERNAL JUGULAR SINGLE LUMEN POWER PORT CATHETER INSERTION Date:  12/09/2023 12/09/2023 4:51 pm Radiologist:  Judie Petit. Ruel Favors, MD Guidance:  Ultrasound and fluoroscopic MEDICATIONS: 1% lidocaine local with epinephrine ANESTHESIA/SEDATION: Versed 1.0 mg IV; Fentanyl 50 mcg IV; Moderate Sedation Time:  31 minutes The patient was continuously monitored during the procedure by the interventional radiology nurse under my direct supervision. FLUOROSCOPY: 0 minutes, 48 seconds (6 mGy) COMPLICATIONS: None immediate. CONTRAST:  None. PROCEDURE: Informed consent was obtained from the patient following explanation of the procedure, risks, benefits and alternatives. The patient understands, agrees and consents for the procedure. All questions were addressed. A time out was performed. Maximal barrier sterile technique utilized including caps, mask, sterile gowns, sterile gloves, large sterile drape, hand  hygiene, and 2% chlorhexidine scrub. Under sterile conditions and local anesthesia, right internal jugular micropuncture venous access was performed. Access was performed with ultrasound. Images were obtained for documentation of the patent right internal jugular vein. A guide wire was inserted followed by a transitional dilator. This allowed insertion of a guide wire and catheter into the IVC. Measurements were obtained from the SVC / RA junction back to the right IJ venotomy site. In the right infraclavicular chest, a subcutaneous pocket was created over the second anterior rib. This was done under sterile conditions and local anesthesia. 1% lidocaine with epinephrine was utilized for this. A 2.5 cm incision was made in the skin. Blunt dissection was performed to create a subcutaneous pocket over the right pectoralis major muscle. The pocket was flushed with saline vigorously. There was adequate hemostasis. The port catheter was assembled and checked for leakage. The port catheter was secured in the pocket with two retention sutures. The tubing was tunneled subcutaneously to the right venotomy site and inserted into the SVC/RA junction through a valved peel-away sheath. Position was confirmed with fluoroscopy. Images were obtained for documentation. The patient tolerated the procedure well. No immediate complications. Incisions were closed in a two layer fashion with 4 - 0 Vicryl suture. Dermabond was applied to the skin. The port catheter was accessed, blood was aspirated followed by saline and heparin flushes. Needle was removed. A dry sterile dressing was applied. IMPRESSION: Ultrasound and fluoroscopically guided right internal jugular single lumen power port catheter insertion. Tip in the SVC/RA junction. Catheter ready for use. Electronically Signed   By: Judie Petit.  Shick M.D.   On: 12/10/2023 08:23

## 2024-01-06 ENCOUNTER — Inpatient Hospital Stay

## 2024-01-06 ENCOUNTER — Inpatient Hospital Stay (HOSPITAL_BASED_OUTPATIENT_CLINIC_OR_DEPARTMENT_OTHER): Admitting: Hematology

## 2024-01-06 VITALS — BP 104/58 | HR 65 | Temp 97.9°F | Resp 18

## 2024-01-06 DIAGNOSIS — C3490 Malignant neoplasm of unspecified part of unspecified bronchus or lung: Secondary | ICD-10-CM

## 2024-01-06 DIAGNOSIS — E785 Hyperlipidemia, unspecified: Secondary | ICD-10-CM | POA: Diagnosis not present

## 2024-01-06 DIAGNOSIS — G629 Polyneuropathy, unspecified: Secondary | ICD-10-CM | POA: Diagnosis not present

## 2024-01-06 DIAGNOSIS — C7801 Secondary malignant neoplasm of right lung: Secondary | ICD-10-CM | POA: Diagnosis not present

## 2024-01-06 DIAGNOSIS — Z8673 Personal history of transient ischemic attack (TIA), and cerebral infarction without residual deficits: Secondary | ICD-10-CM | POA: Diagnosis not present

## 2024-01-06 DIAGNOSIS — Z510 Encounter for antineoplastic radiation therapy: Secondary | ICD-10-CM | POA: Diagnosis not present

## 2024-01-06 DIAGNOSIS — C3412 Malignant neoplasm of upper lobe, left bronchus or lung: Secondary | ICD-10-CM | POA: Diagnosis not present

## 2024-01-06 DIAGNOSIS — I10 Essential (primary) hypertension: Secondary | ICD-10-CM | POA: Diagnosis not present

## 2024-01-06 DIAGNOSIS — C3411 Malignant neoplasm of upper lobe, right bronchus or lung: Secondary | ICD-10-CM | POA: Diagnosis not present

## 2024-01-06 DIAGNOSIS — Z5111 Encounter for antineoplastic chemotherapy: Secondary | ICD-10-CM | POA: Diagnosis not present

## 2024-01-06 DIAGNOSIS — C771 Secondary and unspecified malignant neoplasm of intrathoracic lymph nodes: Secondary | ICD-10-CM | POA: Diagnosis not present

## 2024-01-06 DIAGNOSIS — R490 Dysphonia: Secondary | ICD-10-CM | POA: Diagnosis not present

## 2024-01-06 LAB — CBC WITH DIFFERENTIAL/PLATELET
Abs Immature Granulocytes: 0.03 10*3/uL (ref 0.00–0.07)
Basophils Absolute: 0 10*3/uL (ref 0.0–0.1)
Basophils Relative: 0 %
Eosinophils Absolute: 0 10*3/uL (ref 0.0–0.5)
Eosinophils Relative: 0 %
HCT: 40.1 % (ref 39.0–52.0)
Hemoglobin: 12.5 g/dL — ABNORMAL LOW (ref 13.0–17.0)
Immature Granulocytes: 1 %
Lymphocytes Relative: 11 %
Lymphs Abs: 0.5 10*3/uL — ABNORMAL LOW (ref 0.7–4.0)
MCH: 26.9 pg (ref 26.0–34.0)
MCHC: 31.2 g/dL (ref 30.0–36.0)
MCV: 86.4 fL (ref 80.0–100.0)
Monocytes Absolute: 0.5 10*3/uL (ref 0.1–1.0)
Monocytes Relative: 10 %
Neutro Abs: 3.7 10*3/uL (ref 1.7–7.7)
Neutrophils Relative %: 78 %
Platelets: 161 10*3/uL (ref 150–400)
RBC: 4.64 MIL/uL (ref 4.22–5.81)
RDW: 14.9 % (ref 11.5–15.5)
WBC: 4.7 10*3/uL (ref 4.0–10.5)
nRBC: 0 % (ref 0.0–0.2)

## 2024-01-06 LAB — COMPREHENSIVE METABOLIC PANEL WITH GFR
ALT: 26 U/L (ref 0–44)
AST: 22 U/L (ref 15–41)
Albumin: 3.6 g/dL (ref 3.5–5.0)
Alkaline Phosphatase: 30 U/L — ABNORMAL LOW (ref 38–126)
Anion gap: 8 (ref 5–15)
BUN: 18 mg/dL (ref 8–23)
CO2: 26 mmol/L (ref 22–32)
Calcium: 8.8 mg/dL — ABNORMAL LOW (ref 8.9–10.3)
Chloride: 101 mmol/L (ref 98–111)
Creatinine, Ser: 0.94 mg/dL (ref 0.61–1.24)
GFR, Estimated: >60 mL/min (ref >60)
Glucose, Bld: 100 mg/dL — ABNORMAL HIGH (ref 70–99)
Potassium: 4.1 mmol/L (ref 3.5–5.1)
Sodium: 135 mmol/L (ref 135–145)
Total Bilirubin: 1 mg/dL (ref 0.0–1.2)
Total Protein: 6.5 g/dL (ref 6.5–8.1)

## 2024-01-06 LAB — MAGNESIUM: Magnesium: 1.9 mg/dL (ref 1.7–2.4)

## 2024-01-06 MED ORDER — HEPARIN SOD (PORK) LOCK FLUSH 100 UNIT/ML IV SOLN
500.0000 [IU] | Freq: Once | INTRAVENOUS | Status: AC | PRN
  Administered 2024-01-06: 500 [IU]

## 2024-01-06 MED ORDER — SODIUM CHLORIDE 0.9 % IV SOLN
227.4000 mg | Freq: Once | INTRAVENOUS | Status: AC
  Administered 2024-01-06: 230 mg via INTRAVENOUS
  Filled 2024-01-06: qty 23

## 2024-01-06 MED ORDER — SODIUM CHLORIDE 0.9 % IV SOLN: INTRAVENOUS | Status: DC

## 2024-01-06 MED ORDER — PALONOSETRON HCL INJECTION 0.25 MG/5ML
0.2500 mg | Freq: Once | INTRAVENOUS | Status: AC
  Administered 2024-01-06: 0.25 mg via INTRAVENOUS
  Filled 2024-01-06: qty 5

## 2024-01-06 MED ORDER — SODIUM CHLORIDE 0.9 % IV SOLN
45.0000 mg/m2 | Freq: Once | INTRAVENOUS | Status: AC
  Administered 2024-01-06: 96 mg via INTRAVENOUS
  Filled 2024-01-06: qty 16

## 2024-01-06 MED ORDER — SODIUM CHLORIDE 0.9% FLUSH
10.0000 mL | Freq: Once | INTRAVENOUS | Status: AC
  Administered 2024-01-06: 10 mL via INTRAVENOUS

## 2024-01-06 MED ORDER — SODIUM CHLORIDE 0.9% FLUSH
10.0000 mL | INTRAVENOUS | Status: DC | PRN
  Administered 2024-01-06: 10 mL

## 2024-01-06 MED ORDER — CETIRIZINE HCL 10 MG/ML IV SOLN
10.0000 mg | Freq: Once | INTRAVENOUS | Status: AC
  Administered 2024-01-06: 10 mg via INTRAVENOUS
  Filled 2024-01-06: qty 1

## 2024-01-06 MED ORDER — DEXAMETHASONE SODIUM PHOSPHATE 10 MG/ML IJ SOLN
10.0000 mg | Freq: Once | INTRAMUSCULAR | Status: AC
  Administered 2024-01-06: 10 mg via INTRAVENOUS
  Filled 2024-01-06: qty 1

## 2024-01-06 MED ORDER — FAMOTIDINE IN NACL 20-0.9 MG/50ML-% IV SOLN
20.0000 mg | Freq: Once | INTRAVENOUS | Status: AC
  Administered 2024-01-06: 20 mg via INTRAVENOUS
  Filled 2024-01-06: qty 50

## 2024-01-06 NOTE — Progress Notes (Signed)
 Labs reviewed today with MD. Rip Harbour to treat as planned per MD.  Treatment given per orders. Patient tolerated it well without problems. Vitals stable and discharged home from clinic via wheelchair. Follow up as scheduled.

## 2024-01-06 NOTE — Patient Instructions (Signed)
 [facility] [place] - A DEPT OF [facility]  Discharge Instructions: Thank you for choosing [facility] to provide your oncology and hematology care.  If you have a lab appointment with the Cancer Center - please note that after April 8th, 2024, all labs will be drawn in the cancer center.  You do not have to check in or register with the main entrance as you have in the past but will complete your check-in in the cancer center.  Wear comfortable clothing and clothing appropriate for easy access to any Portacath or PICC line.   We strive to give you quality time with your provider. You may need to reschedule your appointment if you arrive late (15 or more minutes).  Arriving late affects you and other patients whose appointments are after yours.  Also, if you miss three or more appointments without notifying the office, you may be dismissed from the clinic at the provider's discretion.      For prescription refill requests, have your pharmacy contact our office and allow 72 hours for refills to be completed.    Today you received the following chemotherapy and/or immunotherapy agents Paclitaxel, Carboplatin   To help prevent nausea and vomiting after your treatment, we encourage you to take your nausea medication as directed.  BELOW ARE SYMPTOMS THAT SHOULD BE REPORTED IMMEDIATELY: *FEVER GREATER THAN 100.4 F (38 C) OR HIGHER *CHILLS OR SWEATING *NAUSEA AND VOMITING THAT IS NOT CONTROLLED WITH YOUR NAUSEA MEDICATION *UNUSUAL SHORTNESS OF BREATH *UNUSUAL BRUISING OR BLEEDING *URINARY PROBLEMS (pain or burning when urinating, or frequent urination) *BOWEL PROBLEMS (unusual diarrhea, constipation, pain near the anus) TENDERNESS IN MOUTH AND THROAT WITH OR WITHOUT PRESENCE OF ULCERS (sore throat, sores in mouth, or a toothache) UNUSUAL RASH, SWELLING OR PAIN  UNUSUAL VAGINAL DISCHARGE OR ITCHING   Items with * indicate a potential emergency and should be  followed up as soon as possible or go to the Emergency Department if any problems should occur.  Please show the CHEMOTHERAPY ALERT CARD or IMMUNOTHERAPY ALERT CARD at check-in to the Emergency Department and triage nurse.  Should you have questions after your visit or need to cancel or reschedule your appointment, please contact [facility] [place] - A DEPT OF [facility] [phone]  and follow the prompts.  Office hours are 8:00 a.m. to 4:30 p.m. Monday - Friday. Please note that voicemails left after 4:00 p.m. may not be returned until the following business day.  We are closed weekends and major holidays. You have access to a nurse at all times for urgent questions. Please call the main number to the clinic [phone] and follow the prompts.  For any non-urgent questions, you may also contact your provider using MyChart. We now offer e-Visits for anyone 77 and older to request care online for non-urgent symptoms. For details visit [email or web address].   Also download the MyChart app! Go to the app store, search "MyChart", open the app, select [place], and log in with your MyChart username and password.

## 2024-01-06 NOTE — Progress Notes (Signed)
 Patient has been examined by Dr. Ellin Saba. Vital signs and labs have been reviewed by MD - ANC, Creatinine, LFTs, hemoglobin, and platelets are within treatment parameters per M.D. - pt may proceed with treatment.  Primary RN and pharmacy notified.

## 2024-01-06 NOTE — Patient Instructions (Signed)

## 2024-01-07 DIAGNOSIS — Z8673 Personal history of transient ischemic attack (TIA), and cerebral infarction without residual deficits: Secondary | ICD-10-CM | POA: Diagnosis not present

## 2024-01-07 DIAGNOSIS — E785 Hyperlipidemia, unspecified: Secondary | ICD-10-CM | POA: Diagnosis not present

## 2024-01-07 DIAGNOSIS — I10 Essential (primary) hypertension: Secondary | ICD-10-CM | POA: Diagnosis not present

## 2024-01-07 DIAGNOSIS — Z510 Encounter for antineoplastic radiation therapy: Secondary | ICD-10-CM | POA: Diagnosis not present

## 2024-01-07 DIAGNOSIS — C771 Secondary and unspecified malignant neoplasm of intrathoracic lymph nodes: Secondary | ICD-10-CM | POA: Diagnosis not present

## 2024-01-07 DIAGNOSIS — C3411 Malignant neoplasm of upper lobe, right bronchus or lung: Secondary | ICD-10-CM | POA: Diagnosis not present

## 2024-01-09 DIAGNOSIS — I10 Essential (primary) hypertension: Secondary | ICD-10-CM | POA: Diagnosis not present

## 2024-01-09 DIAGNOSIS — Z510 Encounter for antineoplastic radiation therapy: Secondary | ICD-10-CM | POA: Diagnosis not present

## 2024-01-09 DIAGNOSIS — C3411 Malignant neoplasm of upper lobe, right bronchus or lung: Secondary | ICD-10-CM | POA: Diagnosis not present

## 2024-01-09 DIAGNOSIS — E785 Hyperlipidemia, unspecified: Secondary | ICD-10-CM | POA: Diagnosis not present

## 2024-01-09 DIAGNOSIS — Z8673 Personal history of transient ischemic attack (TIA), and cerebral infarction without residual deficits: Secondary | ICD-10-CM | POA: Diagnosis not present

## 2024-01-09 DIAGNOSIS — C771 Secondary and unspecified malignant neoplasm of intrathoracic lymph nodes: Secondary | ICD-10-CM | POA: Diagnosis not present

## 2024-01-12 ENCOUNTER — Encounter

## 2024-01-12 DIAGNOSIS — C771 Secondary and unspecified malignant neoplasm of intrathoracic lymph nodes: Secondary | ICD-10-CM | POA: Diagnosis not present

## 2024-01-12 DIAGNOSIS — E785 Hyperlipidemia, unspecified: Secondary | ICD-10-CM | POA: Diagnosis not present

## 2024-01-12 DIAGNOSIS — Z8673 Personal history of transient ischemic attack (TIA), and cerebral infarction without residual deficits: Secondary | ICD-10-CM | POA: Diagnosis not present

## 2024-01-12 DIAGNOSIS — I10 Essential (primary) hypertension: Secondary | ICD-10-CM | POA: Diagnosis not present

## 2024-01-12 DIAGNOSIS — C3411 Malignant neoplasm of upper lobe, right bronchus or lung: Secondary | ICD-10-CM | POA: Diagnosis not present

## 2024-01-12 DIAGNOSIS — Z510 Encounter for antineoplastic radiation therapy: Secondary | ICD-10-CM | POA: Diagnosis not present

## 2024-01-13 ENCOUNTER — Inpatient Hospital Stay

## 2024-01-13 ENCOUNTER — Encounter: Payer: Self-pay | Admitting: Internal Medicine

## 2024-01-13 ENCOUNTER — Other Ambulatory Visit: Payer: Self-pay

## 2024-01-13 ENCOUNTER — Inpatient Hospital Stay (HOSPITAL_BASED_OUTPATIENT_CLINIC_OR_DEPARTMENT_OTHER): Admitting: Hematology

## 2024-01-13 VITALS — BP 98/68 | Wt 219.7 lb

## 2024-01-13 VITALS — BP 118/75 | HR 70 | Temp 98.3°F | Resp 20

## 2024-01-13 DIAGNOSIS — C7801 Secondary malignant neoplasm of right lung: Secondary | ICD-10-CM | POA: Diagnosis not present

## 2024-01-13 DIAGNOSIS — C771 Secondary and unspecified malignant neoplasm of intrathoracic lymph nodes: Secondary | ICD-10-CM | POA: Diagnosis not present

## 2024-01-13 DIAGNOSIS — Z5111 Encounter for antineoplastic chemotherapy: Secondary | ICD-10-CM | POA: Diagnosis not present

## 2024-01-13 DIAGNOSIS — Z510 Encounter for antineoplastic radiation therapy: Secondary | ICD-10-CM | POA: Diagnosis not present

## 2024-01-13 DIAGNOSIS — Z8673 Personal history of transient ischemic attack (TIA), and cerebral infarction without residual deficits: Secondary | ICD-10-CM | POA: Diagnosis not present

## 2024-01-13 DIAGNOSIS — C3490 Malignant neoplasm of unspecified part of unspecified bronchus or lung: Secondary | ICD-10-CM

## 2024-01-13 DIAGNOSIS — C3411 Malignant neoplasm of upper lobe, right bronchus or lung: Secondary | ICD-10-CM | POA: Diagnosis not present

## 2024-01-13 DIAGNOSIS — E785 Hyperlipidemia, unspecified: Secondary | ICD-10-CM | POA: Diagnosis not present

## 2024-01-13 DIAGNOSIS — C3412 Malignant neoplasm of upper lobe, left bronchus or lung: Secondary | ICD-10-CM | POA: Diagnosis not present

## 2024-01-13 DIAGNOSIS — G629 Polyneuropathy, unspecified: Secondary | ICD-10-CM | POA: Diagnosis not present

## 2024-01-13 DIAGNOSIS — I10 Essential (primary) hypertension: Secondary | ICD-10-CM | POA: Diagnosis not present

## 2024-01-13 DIAGNOSIS — R490 Dysphonia: Secondary | ICD-10-CM | POA: Diagnosis not present

## 2024-01-13 LAB — CBC WITH DIFFERENTIAL/PLATELET
Abs Immature Granulocytes: 0.04 10*3/uL (ref 0.00–0.07)
Basophils Absolute: 0 10*3/uL (ref 0.0–0.1)
Basophils Relative: 0 %
Eosinophils Absolute: 0 10*3/uL (ref 0.0–0.5)
Eosinophils Relative: 0 %
HCT: 38.5 % — ABNORMAL LOW (ref 39.0–52.0)
Hemoglobin: 12.3 g/dL — ABNORMAL LOW (ref 13.0–17.0)
Immature Granulocytes: 1 %
Lymphocytes Relative: 10 %
Lymphs Abs: 0.4 10*3/uL — ABNORMAL LOW (ref 0.7–4.0)
MCH: 27.4 pg (ref 26.0–34.0)
MCHC: 31.9 g/dL (ref 30.0–36.0)
MCV: 85.7 fL (ref 80.0–100.0)
Monocytes Absolute: 0.4 10*3/uL (ref 0.1–1.0)
Monocytes Relative: 10 %
Neutro Abs: 3.2 10*3/uL (ref 1.7–7.7)
Neutrophils Relative %: 79 %
Platelets: 174 10*3/uL (ref 150–400)
RBC: 4.49 MIL/uL (ref 4.22–5.81)
RDW: 15.6 % — ABNORMAL HIGH (ref 11.5–15.5)
WBC: 4 10*3/uL (ref 4.0–10.5)
nRBC: 0 % (ref 0.0–0.2)

## 2024-01-13 LAB — COMPREHENSIVE METABOLIC PANEL WITH GFR
ALT: 23 U/L (ref 0–44)
AST: 22 U/L (ref 15–41)
Albumin: 3.5 g/dL (ref 3.5–5.0)
Alkaline Phosphatase: 34 U/L — ABNORMAL LOW (ref 38–126)
Anion gap: 8 (ref 5–15)
BUN: 20 mg/dL (ref 8–23)
CO2: 26 mmol/L (ref 22–32)
Calcium: 8.7 mg/dL — ABNORMAL LOW (ref 8.9–10.3)
Chloride: 102 mmol/L (ref 98–111)
Creatinine, Ser: 1.02 mg/dL (ref 0.61–1.24)
GFR, Estimated: >60 mL/min (ref >60)
Glucose, Bld: 173 mg/dL — ABNORMAL HIGH (ref 70–99)
Potassium: 4 mmol/L (ref 3.5–5.1)
Sodium: 136 mmol/L (ref 135–145)
Total Bilirubin: 0.8 mg/dL (ref 0.0–1.2)
Total Protein: 6.2 g/dL — ABNORMAL LOW (ref 6.5–8.1)

## 2024-01-13 LAB — MAGNESIUM: Magnesium: 1.7 mg/dL (ref 1.7–2.4)

## 2024-01-13 MED ORDER — SODIUM CHLORIDE 0.9 % IV SOLN
45.0000 mg/m2 | Freq: Once | INTRAVENOUS | Status: AC
  Administered 2024-01-13: 96 mg via INTRAVENOUS
  Filled 2024-01-13: qty 16

## 2024-01-13 MED ORDER — SODIUM CHLORIDE 0.9% FLUSH
10.0000 mL | INTRAVENOUS | Status: DC | PRN
Start: 2024-01-13 — End: 2024-01-13
  Administered 2024-01-13: 10 mL

## 2024-01-13 MED ORDER — DEXAMETHASONE SODIUM PHOSPHATE 10 MG/ML IJ SOLN
10.0000 mg | Freq: Once | INTRAMUSCULAR | Status: AC
  Administered 2024-01-13: 10 mg via INTRAVENOUS
  Filled 2024-01-13: qty 1

## 2024-01-13 MED ORDER — SODIUM CHLORIDE 0.9 % IV SOLN
230.0000 mg | Freq: Once | INTRAVENOUS | Status: AC
  Administered 2024-01-13: 230 mg via INTRAVENOUS
  Filled 2024-01-13: qty 23

## 2024-01-13 MED ORDER — HEPARIN SOD (PORK) LOCK FLUSH 100 UNIT/ML IV SOLN
500.0000 [IU] | Freq: Once | INTRAVENOUS | Status: AC | PRN
  Administered 2024-01-13: 500 [IU]

## 2024-01-13 MED ORDER — SODIUM CHLORIDE 0.9 % IV SOLN: INTRAVENOUS | Status: DC

## 2024-01-13 MED ORDER — PANTOPRAZOLE SODIUM 40 MG PO TBEC: 40.0000 mg | DELAYED_RELEASE_TABLET | Freq: Every day | ORAL | 1 refills | Status: DC

## 2024-01-13 MED ORDER — FAMOTIDINE IN NACL 20-0.9 MG/50ML-% IV SOLN
20.0000 mg | Freq: Once | INTRAVENOUS | Status: AC
  Administered 2024-01-13: 20 mg via INTRAVENOUS
  Filled 2024-01-13: qty 50

## 2024-01-13 MED ORDER — PALONOSETRON HCL INJECTION 0.25 MG/5ML
0.2500 mg | Freq: Once | INTRAVENOUS | Status: AC
  Administered 2024-01-13: 0.25 mg via INTRAVENOUS
  Filled 2024-01-13: qty 5

## 2024-01-13 MED ORDER — SODIUM CHLORIDE 0.9% FLUSH
10.0000 mL | INTRAVENOUS | Status: DC | PRN
  Administered 2024-01-13: 10 mL via INTRAVENOUS

## 2024-01-13 MED ORDER — CETIRIZINE HCL 10 MG/ML IV SOLN
10.0000 mg | Freq: Once | INTRAVENOUS | Status: AC
  Administered 2024-01-13: 10 mg via INTRAVENOUS
  Filled 2024-01-13: qty 1

## 2024-01-13 NOTE — Progress Notes (Signed)
 [facility] [address] [place] [address]   Clinic Day:  01/13/2024  Referring physician: Anabel Halon, MD  Patient Care Team: Anabel Halon, MD as PCP - General (Internal Medicine) [name], MD as Medical Oncologist (Medical Oncology) Therese Sarah, RN as Oncology Nurse Navigator (Medical Oncology)   ASSESSMENT & PLAN:   Assessment:  1.  Recurrent adenocarcinoma of the lung: - Stage Ia (T1b N0 M0) adenocarcinoma of the left upper lobe, s/p left VATS, left upper lobectomy with mediastinal lymph node dissection by Dr. Dorris Fetch on 08/01/2016 - Stage Ia moderately differentiated adenocarcinoma, 0.7 cm of the right upper lobe, s/p right VATS with wedge resection of right upper lobe nodule and bleb resection by Dr. Dorris Fetch on 12/09/2018 - PET scan (08/19/2023): Dysregulation artifact degrades image quality.  Recurrent bronchogenic carcinoma in the medial right upper lobe along the resection margin with no evidence of distant metastatic disease.  Faint 6 mm nodule in the superior segment right lower lobe too small for PET resolution.  Focal hypermetabolism in the central prostate. - CT super D chest (10/21/2023): Solid 2.6 cm central right upper lobe pulmonary nodule along the wedge resection suture line.  Solid 1.3 cm and 0.7 cm peripheral right lower lobe lung nodules. - MRI brain (12/02/2023): Without IV contrast with no strong evidence of cerebral metastatic disease. - Right paratracheal lymph node FNA (10/24/2023): Adenocarcinoma - Foundation 1 CDX: Insufficient tissue - PD-L1 22 C3 (TPS): 5% - Chemoradiation therapy with weekly carboplatin and paclitaxel started on 12/23/2023  2.  Social/family history: - He lives at home with his wife Randall Sullivan.  He had a stroke in 2022 which has caused short-term memory loss, loss of right-sided peripheral vision and balance problems.  He currently walks with a cane and sometimes walker.  He is also on  continuous oxygen since hospitalization in December 2024.  Most of the day he spends time sitting in the chair.  He can wash dishes but does require some help with some of the ADLs.  Quit smoking in 2017. - Mother and sister had ovarian cancer.  Half brother had lung cancer.   Plan:  1.  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 - Denies any GI side effects.  Today he feels dizzy.  Blood pressure is low at 98/68.  I have reviewed his blood pressure medications which include metoprolol and Cardizem.  He already took both of them today. - We will give him 500 mL normal saline bolus.  I have recommended that we hold his Toprol-XL starting tomorrow. - Reviewed labs today: Normal creatinine and LFTs.  ALK lites normal.  CBC grossly normal.  He may proceed with week for treatment today.  RTC 1 week for follow-up.  2.  Hoarseness: - Reported hoarseness since December and it is worse in the afternoons.  2 we have made a referral to ENT.  He would like to be evaluated after completion of chemoradiation.  3.  Peripheral neuropathy: - Numbness in the fingertips of both hands, right more than left since stroke has been stable.  No worsening noted since chemotherapy started.   No orders of the defined types were placed in this encounter.     Randall Sullivan,acting as a Neurosurgeon for [name], MD.,have documented all relevant documentation on the behalf of [name], MD,as directed by  [name], MD while in the presence of [facility]  Ellin Saba, MD.  I, [name] MD, have reviewed the above documentation for accuracy and completeness, and I agree with the above. So this     [name], MD   4/15/202512:29 PM  CHIEF COMPLAINT/PURPOSE OF CONSULT:   Diagnosis: Recurrent stage III adenocarcinoma   Cancer Staging  Primary lung adenocarcinoma (HCC) Staging form: Lung, AJCC 8th  Edition - Clinical: Stage IA2 (cT1b, cN0, cM0) - Signed by [name], MD on 12/04/2020  Recurrent non-small cell lung cancer (NSCLC) (HCC) Staging form: Lung, AJCC V9 - Clinical stage from 12/03/2023: Stage IA3 (rcT1c, cN0, cM0) - Unsigned    Prior Therapy: Left upper lobectomy (2017) and right upper lobectomy (2020)  Current Therapy: Concurrent chemoradiation therapy   HISTORY OF PRESENT ILLNESS:   Oncology History  Primary lung adenocarcinoma (HCC)  10/15/2016 Initial Diagnosis   Primary lung adenocarcinoma (HCC)   12/04/2020 Cancer Staging   Staging form: Lung, AJCC 8th Edition - Clinical: Stage IA2 (cT1b, cN0, cM0) - Signed by [name], MD on 12/04/2020   Recurrent non-small cell lung cancer (NSCLC) (HCC)  12/03/2023 Initial Diagnosis   Recurrent non-small cell lung cancer (NSCLC) (HCC)   12/23/2023 -  Chemotherapy   Patient is on Treatment Plan : LUNG Carboplatin + Paclitaxel + XRT q7d         Randall Sullivan is a 73 y.o. male presenting to clinic today for evaluation of recurrent adenocarcinoma of right lung, non small cell type at the request of Dr. Arbutus Ped.  Randall Sullivan was first diagnosed in 2017 with Stage IA (T1b, N0, M0) non-small cell lung cancer, well-differentiated adenocarcinoma presented with left upper lobe pulmonary nodule. He then underwent left VATS and left upper lobectomy with mediastinal lymph node dissection under Dr. Dorris Fetch on 08/01/2016. Randall Sullivan has since been under surveillance for left lung adenocarcinoma.  He was diagnosed with stage Ia non-small cell lung cancer in March 2020 and underwent right VATS with wedge resection of right upper lobe nodule and bleb resection under Dr. Dorris Fetch on 12/10/2018. He was under observation until a CT scan on 07/08/23 found an increasing mass like soft tissue along the suture margin in the central right upper lobe. PET on 08/19/23 found: recurrent bronchogenic carcinoma in the medial right upper lobe along the resection margin  with faint 0.6 cm nodule in the superior segment of the right lower lobe too small for PET resolution.   Randall Sullivan then had a bronchoscopy on 10/24/23 with right paratracheal lymph node biopsy and endobronchial valve replacement in the anterior apical, posterior segments of right upper lobe, and superior segment of right lower lobe. Pathology of lymph node biopsy showed: adenocarcinoma. Molecular pathology showed a PDL1 score of 5%.   His cancer is now considered Stage III NSCLC adenocarcinoma due to right paratracheal lymph node involvement. Randall Sullivan had discussion of treatment with Dr. Arbutus Ped on 11/26/23 that consisted of weekly mild chemotherapy with adjunct radiation at [facility] with their side effects.   MRI brain done on 12/02/23 showed: No strong evidence of cerebral metastatic disease on this noncontrast exam. 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 Encephalomalacia and mild hemosiderin. No acute intracranial abnormality identified.  Randall Sullivan has a history of stroke, with symptoms of fatigue, weakness, vision changes, and hoarseness. He also has pertinent history of COPD and requires at home oxygen.  Today, he states that he is doing well overall. His appetite level is at 100%. His energy level is at 50%.  INTERVAL HISTORY:   Randall Sullivan is a  73 y.o. male presenting to the clinic today for follow-up of recurrent stage III NSCLC. He was last seen by me on 01/06/24.  Today, he states that he is doing well overall. His appetite level is at 100%. His energy level is at 50%. Randall Sullivan is accompanied by his wife. He reports low blood pressure and currently takes metoprolol succinate and Cardizem. His wife also notes he had dizziness this morning. He reports constipation, requiring stool softener and one enema. His neuropathy in the form of numbness in the fingertips is stable. He denies any dysphagia or breathing difficulties.   PAST MEDICAL HISTORY:   Past Medical History: Past Medical  History:  Diagnosis Date   A-fib (HCC) 2017   after last lung surgery patient had a history if Afib documented in hospital    AAA (abdominal aortic aneurysm) (HCC)    4.0 cm 09/17/22 [place]   Acute appendicitis 07/14/2021   Arthritis    back & legs ( knees)   Cancer (HCC)    lung   Complication of anesthesia    agitated upon waking from anesth.    Dyspnea    Dysrhythmia    A. Fib   Emphysema    Emphysema of lung (HCC)    History of kidney stones    Hyperlipidemia    Hypertension    Oxygen dependent    2L continuous   Peripheral vascular disease (HCC)    Aortic Aneurysm   Pneumonia    Pre-diabetes    Sleep apnea    No OSA, but wears 2L [place] O2 continuous including at night   Stroke [facility]) 2022   Right sided weakness, decreased hearing, balance and memory loss   Tobacco abuse     Surgical History: Past Surgical History:  Procedure Laterality Date   HERNIA REPAIR Bilateral 1999   umbilical   IR IMAGING GUIDED PORT INSERTION  12/09/2023   LAPAROSCOPIC APPENDECTOMY N/A 07/15/2021   Procedure: APPENDECTOMY LAPAROSCOPIC;  Surgeon: Lucretia Roers, MD;  Location: [facility];  Service: General;  Laterality: N/A;   VIDEO ASSISTED THORACOSCOPY Right 12/21/2018   Procedure: VIDEO ASSISTED THORACOSCOPY AND RESECTION OF BLEBS RIGHT LOWER LOBE;  Surgeon: Loreli Slot, MD;  Location: [facility] OR;  Service: Thoracic;  Laterality: Right;   VIDEO ASSISTED THORACOSCOPY (VATS)/ LOBECTOMY Left 08/01/2016   Procedure: VIDEO ASSISTED THORACOSCOPY (VATS)/ LEFT UPPER LOBECTOMY with lymph node sampling and onQ placement;  Surgeon: Loreli Slot, MD;  Location: [facility] OR;  Service: Thoracic;  Laterality: Left;   VIDEO ASSISTED THORACOSCOPY (VATS)/WEDGE RESECTION Right 12/09/2018   Procedure: VIDEO ASSISTED THORACOSCOPY (VATS)/WEDGE RESECTION;  Surgeon: Loreli Slot, MD;  Location: [facility];  Service: Thoracic;  Laterality: Right;   VIDEO BRONCHOSCOPY Bilateral 12/21/2018   Procedure: VIDEO  BRONCHOSCOPY;  Surgeon: Loreli Slot, MD;  Location: [facility];  Service: Thoracic;  Laterality: Bilateral;   VIDEO BRONCHOSCOPY WITH ENDOBRONCHIAL NAVIGATION N/A 07/03/2016   Procedure: VIDEO BRONCHOSCOPY WITH ENDOBRONCHIAL NAVIGATION;  Surgeon: Loreli Slot, MD;  Location: [facility];  Service: Thoracic;  Laterality: N/A;   VIDEO BRONCHOSCOPY WITH ENDOBRONCHIAL NAVIGATION N/A 10/24/2023   Procedure: VIDEO BRONCHOSCOPY WITH ENDOBRONCHIAL NAVIGATION;  Surgeon: Loreli Slot, MD;  Location: [facility];  Service: Thoracic;  Laterality: N/A;   VIDEO BRONCHOSCOPY WITH ENDOBRONCHIAL ULTRASOUND N/A 10/24/2023   Procedure: VIDEO BRONCHOSCOPY WITH ENDOBRONCHIAL ULTRASOUND;  Surgeon: Loreli Slot, MD;  Location: [facility];  Service: Thoracic;  Laterality: N/A;   VIDEO BRONCHOSCOPY WITH INSERTION OF INTERBRONCHIAL VALVE (IBV)  N/A 12/11/2018   Procedure: VIDEO BRONCHOSCOPY WITH INSERTION OF INTERBRONCHIAL VALVE (IBV);  Surgeon: Loreli Slot, MD;  Location: [facility] OR;  Service: Thoracic;  Laterality: N/A;   VIDEO BRONCHOSCOPY WITH INSERTION OF INTERBRONCHIAL VALVE (IBV) N/A 02/10/2019   Procedure: VIDEO BRONCHOSCOPY WITH REMOVAL OF INTERBRONCHIAL VALVE (IBV);  Surgeon: Loreli Slot, MD;  Location: [facility] OR;  Service: Thoracic;  Laterality: N/A;    Social History: Social History   Socioeconomic History   Marital status: Married    Spouse name: Not on file   Number of children: 2   Years of education: Not on file   Highest education level: 9th grade  Occupational History   Occupation: Vinyl siding   Occupation: Retired  Tobacco Use   Smoking status: Former    Current packs/day: 0.00    Average packs/day: 1 pack/day for 40.0 years (40.0 ttl pk-yrs)    Types: Cigarettes    Start date: 05/28/1976    Quit date: 05/28/2016    Years since quitting: 7.6   Smokeless tobacco: Never  Vaping Use   Vaping status: Never Used  Substance and Sexual Activity   Alcohol use: Not Currently     Comment: quit- 2016   Drug use: No   Sexual activity: Not Currently  Other Topics Concern   Not on file  Social History Narrative   Married for 20 years,second.Lives with wife. 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   Social Drivers of Corporate investment banker Strain: High Risk (12/10/2023)   Overall Financial Resource Strain (CARDIA)    Difficulty of Paying Living Expenses: Very hard  Food Insecurity: No Food Insecurity (09/17/2023)   Hunger Vital Sign    Worried About Running Out of Food in the Last Year: Never true    Ran Out of Food in the Last Year: Never true  Transportation Needs: No Transportation Needs (09/17/2023)   PRAPARE - Administrator, Civil Service (Medical): No    Lack of Transportation (Non-Medical): No  Physical Activity: Insufficiently Active (07/11/2023)   Exercise Vital Sign    Days of Exercise per Week: 3 days    Minutes of Exercise per Session: 10 min  Stress: Patient Declined (07/11/2023)   [employer] of Occupational Health - Occupational Stress Questionnaire    Feeling of Stress : Patient declined  Social Connections: Unknown (07/11/2023)   Social Connection and Isolation Panel [NHANES]    Frequency of Communication with Friends and Family: Twice a week    Frequency of Social Gatherings with Friends and Family: Once a week    Attends Religious Services: Patient declined    Database administrator or Organizations: No    Attends Engineer, structural: Not on file    Marital Status: Married  Intimate Partner Violence: Patient Unable To Answer (09/17/2023)   Humiliation, Afraid, Rape, and Kick questionnaire    Fear of Current or Ex-Partner: Patient unable to answer    Emotionally Abused: Patient unable to answer    Physically Abused: Patient unable to answer    Sexually Abused: Patient unable to answer    Family History: Family History  Problem Relation Age of Onset   Cancer Mother    Cancer Sister    Aneurysm  Sister    Kidney disease Paternal Aunt    Asthma Brother    Cancer - Lung Brother    Cancer Brother    Arthritis Brother     Current Medications:  Current Outpatient Medications:    acetaminophen (  TYLENOL) 325 MG tablet, Take 2 tablets (650 mg total) by mouth every 6 (six) hours as needed for mild pain (or Fever >/= 101)., Disp: , Rfl:    ALPRAZolam (XANAX) 0.5 MG tablet, Take 0.5 mg by mouth daily., Disp: , Rfl:    Budeson-Glycopyrrol-Formoterol (BREZTRI AEROSPHERE) 160-9-4.8 MCG/ACT AERO, Inhale 2 puffs into the lungs in the morning and at bedtime., Disp: , Rfl:    cetirizine (ZYRTEC) 10 MG tablet, Take 10 mg by mouth daily., Disp: , Rfl:    Cholecalciferol (VITAMIN D3) 125 MCG (5000 UT) CAPS, Take 1 capsule (5,000 Units total) by mouth daily., Disp: 30 capsule, Rfl: 0   diltiazem (CARDIZEM CD) 180 MG 24 hr capsule, Take 1 capsule (180 mg total) by mouth daily., Disp: 90 capsule, Rfl: 3   fenofibrate (TRICOR) 145 MG tablet, Take 1 tablet (145 mg total) by mouth daily., Disp: 90 tablet, Rfl: 1   finasteride (PROSCAR) 5 MG tablet, Take 1 tablet (5 mg total) by mouth daily., Disp: 30 tablet, Rfl: 3   guaiFENesin (MUCINEX) 600 MG 12 hr tablet, Take by mouth 2 (two) times daily., Disp: , Rfl:    ipratropium-albuterol (DUONEB) 0.5-2.5 (3) MG/3ML SOLN, USE 1 AMPULE IN NEBULIZER 4 TIMES DAILY AND AS NEEDED FOR SHORTNESS OF BREATH AND FOR WHEEZING, Disp: 450 mL, Rfl: 0   lidocaine-prilocaine (EMLA) cream, Apply to affected area once, Disp: 30 g, Rfl: 3   meclizine (ANTIVERT) 25 MG tablet, Take 1 tablet (25 mg total) by mouth 3 (three) times daily as needed for dizziness., Disp: 30 tablet, Rfl: 0   metoprolol succinate (TOPROL XL) 50 MG 24 hr tablet, Take 1 tablet (50 mg total) by mouth daily. Take with or immediately following a meal., Disp: 30 tablet, Rfl: 2   Omega-3 Fatty Acids (FISH OIL ULTRA PO), Take 1,200 mg by mouth in the morning and at bedtime., Disp: , Rfl:    OXYGEN, Inhale 2 L into the  lungs continuous. Hx of lung ca, copd, and emphysema, Disp: , Rfl:    Palonosetron HCl (ALOXI IV), Inject into the vein., Disp: , Rfl:    pantoprazole (PROTONIX) 40 MG tablet, Take 1 tablet (40 mg total) by mouth daily., Disp: 30 tablet, Rfl: 1   polyethylene glycol (MIRALAX / GLYCOLAX) 17 g packet, Take 17 g by mouth daily as needed for mild constipation., Disp: , Rfl:    prochlorperazine (COMPAZINE) 10 MG tablet, Take 1 tablet (10 mg total) by mouth every 6 (six) hours as needed for nausea or vomiting., Disp: 60 tablet, Rfl: 3   rosuvastatin (CRESTOR) 40 MG tablet, Take 1 tablet (40 mg total) by mouth daily., Disp: 90 tablet, Rfl: 3   tamsulosin (FLOMAX) 0.4 MG CAPS capsule, Take 2 capsules (0.8 mg total) by mouth daily., Disp: 180 capsule, Rfl: 3   warfarin (COUMADIN) 5 MG tablet, Take 1 tablet (5 mg total) by mouth daily. (Patient taking differently: Take 2.5-5 mg by mouth See admin instructions. Take 2.5 mg by mouth Tuesday and Friday and take 5 mg on Monday, Wednesday, Thursday, Saturday and Sunday), Disp: 90 tablet, Rfl: 1 No current facility-administered medications for this visit.  Facility-Administered Medications Ordered in Other Visits:    0.9 %  sodium chloride infusion, , Intravenous, Continuous, [name], MD, Last Rate: 10 mL/hr at 01/13/24 1144, New Bag at 01/13/24 1144   0.9 %  sodium chloride infusion, , Intravenous, Continuous, [name], MD, Last Rate: 500 mL/hr at 01/13/24 1147, New Bag at 01/13/24 1147  CARBOplatin (PARAPLATIN) 230 mg in sodium chloride 0.9 % 100 mL chemo infusion, 230 mg, Intravenous, Once, [name], MD   heparin lock flush 100 unit/mL, 500 Units, Intracatheter, Once PRN, [name], MD   PACLitaxel (TAXOL) 96 mg in sodium chloride 0.9 % 250 mL chemo infusion (</= 80mg /m2), 45 mg/m2 (Treatment Plan Recorded), Intravenous, Once, [name], MD   sodium chloride flush (NS) 0.9 % injection 10 mL, 10 mL,  Intracatheter, PRN, [name], MD   Allergies: No Known Allergies  REVIEW OF SYSTEMS:   Review of Systems  Constitutional:  Negative for chills, fatigue and fever.  HENT:   Negative for lump/mass, mouth sores, nosebleeds, sore throat and trouble swallowing.   Eyes:  Negative for eye problems.  Respiratory:  Positive for cough and shortness of breath.   Cardiovascular:  Negative for chest pain, leg swelling and palpitations.  Gastrointestinal:  Negative for abdominal pain, constipation, diarrhea, nausea and vomiting.  Genitourinary:  Negative for bladder incontinence, difficulty urinating, dysuria, frequency, hematuria and nocturia.   Musculoskeletal:  Positive for back pain (6/10 severity). Negative for arthralgias, flank pain, myalgias and neck pain.  Skin:  Negative for itching and rash.  Neurological:  Positive for dizziness, headaches and numbness (in hands and feet).  Hematological:  Does not bruise/bleed easily.  Psychiatric/Behavioral:  Positive for sleep disturbance. Negative for depression and suicidal ideas. The patient is not nervous/anxious.   All other systems reviewed and are negative.    VITALS:   Blood pressure 98/68, weight 219 lb 11.2 oz (99.7 kg).  Wt Readings from Last 3 Encounters:  01/13/24 219 lb 11.2 oz (99.7 kg)  01/06/24 218 lb 7.6 oz (99.1 kg)  12/30/23 218 lb 0.6 oz (98.9 kg)    Body mass index is 31.52 kg/m.  Performance status (ECOG): 1 - Symptomatic but completely ambulatory  PHYSICAL EXAM:   Physical Exam Vitals and nursing note reviewed. Exam conducted with a chaperone present.  Constitutional:      Appearance: Normal appearance.  Cardiovascular:     Rate and Rhythm: Normal rate and regular rhythm.     Pulses: Normal pulses.     Heart sounds: Normal heart sounds.  Pulmonary:     Effort: Pulmonary effort is normal.     Breath sounds: Normal breath sounds.  Abdominal:     Palpations: Abdomen is soft. There is no hepatomegaly,  splenomegaly or mass.     Tenderness: There is no abdominal tenderness.  Musculoskeletal:     Right lower leg: No edema.     Left lower leg: No edema.  Lymphadenopathy:     Cervical: No cervical adenopathy.     Right cervical: No superficial, deep or posterior cervical adenopathy.    Left cervical: No superficial, deep or posterior cervical adenopathy.     Upper Body:     Right upper body: No supraclavicular or axillary adenopathy.     Left upper body: No supraclavicular or axillary adenopathy.  Neurological:     General: No focal deficit present.     Mental Status: He is alert and oriented to person, place, and time.  Psychiatric:        Mood and Affect: Mood normal.        Behavior: Behavior normal.     LABS:   CBC    Component Value Date/Time   WBC 4.0 01/13/2024 1005   RBC 4.49 01/13/2024 1005   HGB 12.3 (L) 01/13/2024 1005   HGB 13.6 11/26/2023 1040   HGB  14.8 07/15/2023 1142   HGB 13.5 04/09/2017 1249   HCT 38.5 (L) 01/13/2024 1005   HCT 48.2 07/15/2023 1142   HCT 42.4 04/09/2017 1249   PLT 174 01/13/2024 1005   PLT 197 11/26/2023 1040   PLT 205 07/15/2023 1142   MCV 85.7 01/13/2024 1005   MCV 86 07/15/2023 1142   MCV 84.8 04/09/2017 1249   MCH 27.4 01/13/2024 1005   MCHC 31.9 01/13/2024 1005   RDW 15.6 (H) 01/13/2024 1005   RDW 13.1 07/15/2023 1142   RDW 15.4 (H) 04/09/2017 1249   LYMPHSABS 0.4 (L) 01/13/2024 1005   LYMPHSABS 1.1 07/31/2021 1026   LYMPHSABS 1.0 04/09/2017 1249   MONOABS 0.4 01/13/2024 1005   MONOABS 1.9 (H) 04/09/2017 1249   EOSABS 0.0 01/13/2024 1005   EOSABS 0.1 07/31/2021 1026   BASOSABS 0.0 01/13/2024 1005   BASOSABS 0.0 07/31/2021 1026   BASOSABS 0.0 04/09/2017 1249    CMP    Component Value Date/Time   NA 136 01/13/2024 1005   NA 140 07/15/2023 1142   NA 136 04/09/2017 1249   K 4.0 01/13/2024 1005   K 4.6 04/09/2017 1249   CL 102 01/13/2024 1005   CO2 26 01/13/2024 1005   CO2 27 04/09/2017 1249   GLUCOSE 173 (H)  01/13/2024 1005   GLUCOSE 103 04/09/2017 1249   BUN 20 01/13/2024 1005   BUN 13 07/15/2023 1142   BUN 23.9 04/09/2017 1249   CREATININE 1.02 01/13/2024 1005   CREATININE 1.10 11/26/2023 1040   CREATININE 1.21 (H) 05/08/2020 1051   CREATININE 1.4 (H) 04/09/2017 1249   CALCIUM 8.7 (L) 01/13/2024 1005   CALCIUM 9.6 04/09/2017 1249   PROT 6.2 (L) 01/13/2024 1005   PROT 6.9 03/04/2023 1515   PROT 7.8 04/09/2017 1249   ALBUMIN 3.5 01/13/2024 1005   ALBUMIN 4.6 03/04/2023 1515   ALBUMIN 3.6 04/09/2017 1249   AST 22 01/13/2024 1005   AST 20 11/26/2023 1040   AST 13 04/09/2017 1249   ALT 23 01/13/2024 1005   ALT 19 11/26/2023 1040   ALT 9 04/09/2017 1249   ALKPHOS 34 (L) 01/13/2024 1005   ALKPHOS 58 04/09/2017 1249   BILITOT 0.8 01/13/2024 1005   BILITOT 0.7 11/26/2023 1040   BILITOT 1.01 04/09/2017 1249   GFRNONAA >60 01/13/2024 1005   GFRNONAA >60 11/26/2023 1040   GFRNONAA 60 05/08/2020 1051   GFRAA >60 06/02/2020 1239   GFRAA 70 05/08/2020 1051     No results found for: "CEA1", "CEA" / No results found for: "CEA1", "CEA" Lab Results  Component Value Date   PSA1 3.5 07/05/2021   No results found for: "ZOX096" No results found for: "CAN125"  No results found for: "TOTALPROTELP", "ALBUMINELP", "A1GS", "A2GS", "BETS", "BETA2SER", "GAMS", "MSPIKE", "SPEI" No results found for: "TIBC", "FERRITIN", "IRONPCTSAT" No results found for: "LDH"   STUDIES:   MR BRAIN W WO CONTRAST Result Date: 12/18/2023 CLINICAL DATA:  Non-small cell lung cancer.  Staging. EXAM: MRI HEAD WITHOUT AND WITH CONTRAST TECHNIQUE: Multiplanar, multiecho pulse sequences of the brain and surrounding structures were obtained without and with intravenous contrast. CONTRAST:  10mL GADAVIST GADOBUTROL 1 MMOL/ML IV SOLN COMPARISON:  Noncontrast exam 12/02/2023.  03/12/2021. FINDINGS: Brain: Diffusion imaging does not show any acute or subacute infarction or other cause of restricted diffusion. Mild chronic  small-vessel ischemic change affects the pons. No focal cerebellar finding. Cerebral hemispheres show an old infarction in the left PCA territory affecting the posteromedial temporal lobe, occipital lobe and  left thalamus. Old small vessel infarction of the right thalamus. Minimal small vessel change otherwise within the cerebral hemispheric white matter. No evidence of intracranial metastatic disease. After contrast administration, no abnormal enhancement occurs. Vascular: Major vessels at the base of the brain show flow. Skull and upper cervical spine: Negative Sinuses/Orbits: Clear/normal Other: None IMPRESSION: 1. No evidence of intracranial metastatic disease. 2. Old left PCA territory infarction affecting the posteromedial temporal lobe, occipital lobe and left thalamus. Old small vessel infarction of the right thalamus. Minimal small vessel change otherwise within the cerebral hemispheric white matter. Mild chronic small-vessel ischemic change of the pons. Electronically Signed   By: Bettylou Brunner M.D.   On: 12/18/2023 11:23

## 2024-01-13 NOTE — Patient Instructions (Signed)

## 2024-01-13 NOTE — Progress Notes (Signed)

## 2024-01-13 NOTE — Patient Instructions (Signed)
 [facility] - A DEPT OF [name]  Discharge Instructions: Thank you for choosing [facility] to provide your oncology and hematology care.  If you have a lab appointment with the Cancer Center - please note that after April 8th, 2024, all labs will be drawn in the cancer center.  You do not have to check in or register with the main entrance as you have in the past but will complete your check-in in the cancer center.  Wear comfortable clothing and clothing appropriate for easy access to any Portacath or PICC line.   We strive to give you quality time with your provider. You may need to reschedule your appointment if you arrive late (15 or more minutes).  Arriving late affects you and other patients whose appointments are after yours.  Also, if you miss three or more appointments without notifying the office, you may be dismissed from the clinic at the provider's discretion.      For prescription refill requests, have your pharmacy contact our office and allow 72 hours for refills to be completed.    Today you received the following chemotherapy and/or immunotherapy agents carboplatin and taxol      To help prevent nausea and vomiting after your treatment, we encourage you to take your nausea medication as directed.  BELOW ARE SYMPTOMS THAT SHOULD BE REPORTED IMMEDIATELY: *FEVER GREATER THAN 100.4 F (38 C) OR HIGHER *CHILLS OR SWEATING *NAUSEA AND VOMITING THAT IS NOT CONTROLLED WITH YOUR NAUSEA MEDICATION *UNUSUAL SHORTNESS OF BREATH *UNUSUAL BRUISING OR BLEEDING *URINARY PROBLEMS (pain or burning when urinating, or frequent urination) *BOWEL PROBLEMS (unusual diarrhea, constipation, pain near the anus) TENDERNESS IN MOUTH AND THROAT WITH OR WITHOUT PRESENCE OF ULCERS (sore throat, sores in mouth, or a toothache) UNUSUAL RASH, SWELLING OR PAIN  UNUSUAL VAGINAL DISCHARGE OR ITCHING   Items with * indicate a potential emergency and should be  followed up as soon as possible or go to the Emergency Department if any problems should occur.  Please show the CHEMOTHERAPY ALERT CARD or IMMUNOTHERAPY ALERT CARD at check-in to the Emergency Department and triage nurse.  Should you have questions after your visit or need to cancel or reschedule your appointment, please contact [facility] [place] - A DEPT OF [facility] [phone]  and follow the prompts.  Office hours are 8:00 a.m. to 4:30 p.m. Monday - Friday. Please note that voicemails left after 4:00 p.m. may not be returned until the following business day.  We are closed weekends and major holidays. You have access to a nurse at all times for urgent questions. Please call the main number to the clinic [phone] and follow the prompts.  For any non-urgent questions, you may also contact your provider using MyChart. We now offer e-Visits for anyone 84 and older to request care online for non-urgent symptoms. For details visit [email or web address].   Also download the MyChart app! Go to the app store, search "MyChart", open the app, select [facility], and log in with your MyChart username and password.

## 2024-01-14 DIAGNOSIS — Z510 Encounter for antineoplastic radiation therapy: Secondary | ICD-10-CM | POA: Diagnosis not present

## 2024-01-14 DIAGNOSIS — C3411 Malignant neoplasm of upper lobe, right bronchus or lung: Secondary | ICD-10-CM | POA: Diagnosis not present

## 2024-01-14 DIAGNOSIS — E785 Hyperlipidemia, unspecified: Secondary | ICD-10-CM | POA: Diagnosis not present

## 2024-01-14 DIAGNOSIS — I10 Essential (primary) hypertension: Secondary | ICD-10-CM | POA: Diagnosis not present

## 2024-01-14 DIAGNOSIS — C771 Secondary and unspecified malignant neoplasm of intrathoracic lymph nodes: Secondary | ICD-10-CM | POA: Diagnosis not present

## 2024-01-14 DIAGNOSIS — Z8673 Personal history of transient ischemic attack (TIA), and cerebral infarction without residual deficits: Secondary | ICD-10-CM | POA: Diagnosis not present

## 2024-01-15 ENCOUNTER — Ambulatory Visit: Attending: Internal Medicine | Admitting: *Deleted

## 2024-01-15 DIAGNOSIS — C3411 Malignant neoplasm of upper lobe, right bronchus or lung: Secondary | ICD-10-CM | POA: Diagnosis not present

## 2024-01-15 DIAGNOSIS — Z510 Encounter for antineoplastic radiation therapy: Secondary | ICD-10-CM | POA: Diagnosis not present

## 2024-01-15 DIAGNOSIS — Z8673 Personal history of transient ischemic attack (TIA), and cerebral infarction without residual deficits: Secondary | ICD-10-CM | POA: Diagnosis not present

## 2024-01-15 DIAGNOSIS — E785 Hyperlipidemia, unspecified: Secondary | ICD-10-CM | POA: Diagnosis not present

## 2024-01-15 DIAGNOSIS — Z5181 Encounter for therapeutic drug level monitoring: Secondary | ICD-10-CM | POA: Diagnosis not present

## 2024-01-15 DIAGNOSIS — C771 Secondary and unspecified malignant neoplasm of intrathoracic lymph nodes: Secondary | ICD-10-CM | POA: Diagnosis not present

## 2024-01-15 DIAGNOSIS — I10 Essential (primary) hypertension: Secondary | ICD-10-CM | POA: Diagnosis not present

## 2024-01-15 DIAGNOSIS — I480 Paroxysmal atrial fibrillation: Secondary | ICD-10-CM | POA: Diagnosis not present

## 2024-01-15 LAB — POCT INR: INR: 2.8 (ref 2.0–3.0)

## 2024-01-15 NOTE — Patient Instructions (Signed)
 Continue warfarin 1 tablet daily except 1/2 tablet on Tuesdays and Fridays Continue greens/salads  Recheck INR in 4 weeks.  On chemo and radiation

## 2024-01-16 DIAGNOSIS — E785 Hyperlipidemia, unspecified: Secondary | ICD-10-CM | POA: Diagnosis not present

## 2024-01-16 DIAGNOSIS — C3411 Malignant neoplasm of upper lobe, right bronchus or lung: Secondary | ICD-10-CM | POA: Diagnosis not present

## 2024-01-16 DIAGNOSIS — Z510 Encounter for antineoplastic radiation therapy: Secondary | ICD-10-CM | POA: Diagnosis not present

## 2024-01-16 DIAGNOSIS — I10 Essential (primary) hypertension: Secondary | ICD-10-CM | POA: Diagnosis not present

## 2024-01-16 DIAGNOSIS — Z8673 Personal history of transient ischemic attack (TIA), and cerebral infarction without residual deficits: Secondary | ICD-10-CM | POA: Diagnosis not present

## 2024-01-16 DIAGNOSIS — C771 Secondary and unspecified malignant neoplasm of intrathoracic lymph nodes: Secondary | ICD-10-CM | POA: Diagnosis not present

## 2024-01-19 DIAGNOSIS — Z510 Encounter for antineoplastic radiation therapy: Secondary | ICD-10-CM | POA: Diagnosis not present

## 2024-01-19 DIAGNOSIS — I10 Essential (primary) hypertension: Secondary | ICD-10-CM | POA: Diagnosis not present

## 2024-01-19 DIAGNOSIS — C3411 Malignant neoplasm of upper lobe, right bronchus or lung: Secondary | ICD-10-CM | POA: Diagnosis not present

## 2024-01-19 DIAGNOSIS — Z8673 Personal history of transient ischemic attack (TIA), and cerebral infarction without residual deficits: Secondary | ICD-10-CM | POA: Diagnosis not present

## 2024-01-19 DIAGNOSIS — C771 Secondary and unspecified malignant neoplasm of intrathoracic lymph nodes: Secondary | ICD-10-CM | POA: Diagnosis not present

## 2024-01-19 DIAGNOSIS — E785 Hyperlipidemia, unspecified: Secondary | ICD-10-CM | POA: Diagnosis not present

## 2024-01-20 ENCOUNTER — Inpatient Hospital Stay (HOSPITAL_BASED_OUTPATIENT_CLINIC_OR_DEPARTMENT_OTHER): Admitting: Hematology

## 2024-01-20 ENCOUNTER — Inpatient Hospital Stay

## 2024-01-20 VITALS — BP 110/80 | HR 98 | Temp 97.8°F | Resp 18

## 2024-01-20 DIAGNOSIS — Z510 Encounter for antineoplastic radiation therapy: Secondary | ICD-10-CM | POA: Diagnosis not present

## 2024-01-20 DIAGNOSIS — C3411 Malignant neoplasm of upper lobe, right bronchus or lung: Secondary | ICD-10-CM | POA: Diagnosis not present

## 2024-01-20 DIAGNOSIS — C7801 Secondary malignant neoplasm of right lung: Secondary | ICD-10-CM | POA: Diagnosis not present

## 2024-01-20 DIAGNOSIS — C3490 Malignant neoplasm of unspecified part of unspecified bronchus or lung: Secondary | ICD-10-CM | POA: Diagnosis not present

## 2024-01-20 DIAGNOSIS — C771 Secondary and unspecified malignant neoplasm of intrathoracic lymph nodes: Secondary | ICD-10-CM | POA: Diagnosis not present

## 2024-01-20 DIAGNOSIS — Z5111 Encounter for antineoplastic chemotherapy: Secondary | ICD-10-CM | POA: Diagnosis not present

## 2024-01-20 DIAGNOSIS — I10 Essential (primary) hypertension: Secondary | ICD-10-CM | POA: Diagnosis not present

## 2024-01-20 DIAGNOSIS — G629 Polyneuropathy, unspecified: Secondary | ICD-10-CM | POA: Diagnosis not present

## 2024-01-20 DIAGNOSIS — R490 Dysphonia: Secondary | ICD-10-CM | POA: Diagnosis not present

## 2024-01-20 DIAGNOSIS — Z8673 Personal history of transient ischemic attack (TIA), and cerebral infarction without residual deficits: Secondary | ICD-10-CM | POA: Diagnosis not present

## 2024-01-20 DIAGNOSIS — C3412 Malignant neoplasm of upper lobe, left bronchus or lung: Secondary | ICD-10-CM | POA: Diagnosis not present

## 2024-01-20 DIAGNOSIS — E785 Hyperlipidemia, unspecified: Secondary | ICD-10-CM | POA: Diagnosis not present

## 2024-01-20 LAB — COMPREHENSIVE METABOLIC PANEL WITH GFR
ALT: 25 U/L (ref 0–44)
AST: 22 U/L (ref 15–41)
Albumin: 3.7 g/dL (ref 3.5–5.0)
Alkaline Phosphatase: 31 U/L — ABNORMAL LOW (ref 38–126)
Anion gap: 10 (ref 5–15)
BUN: 23 mg/dL (ref 8–23)
CO2: 23 mmol/L (ref 22–32)
Calcium: 9.2 mg/dL (ref 8.9–10.3)
Chloride: 102 mmol/L (ref 98–111)
Creatinine, Ser: 1.1 mg/dL (ref 0.61–1.24)
GFR, Estimated: >60 mL/min (ref >60)
Glucose, Bld: 117 mg/dL — ABNORMAL HIGH (ref 70–99)
Potassium: 4.2 mmol/L (ref 3.5–5.1)
Sodium: 135 mmol/L (ref 135–145)
Total Bilirubin: 1 mg/dL (ref 0.0–1.2)
Total Protein: 6.8 g/dL (ref 6.5–8.1)

## 2024-01-20 LAB — CBC WITH DIFFERENTIAL/PLATELET
Abs Immature Granulocytes: 0.04 10*3/uL (ref 0.00–0.07)
Basophils Absolute: 0 10*3/uL (ref 0.0–0.1)
Basophils Relative: 0 %
Eosinophils Absolute: 0 10*3/uL (ref 0.0–0.5)
Eosinophils Relative: 0 %
HCT: 41.7 % (ref 39.0–52.0)
Hemoglobin: 13.1 g/dL (ref 13.0–17.0)
Immature Granulocytes: 1 %
Lymphocytes Relative: 9 %
Lymphs Abs: 0.4 10*3/uL — ABNORMAL LOW (ref 0.7–4.0)
MCH: 26.8 pg (ref 26.0–34.0)
MCHC: 31.4 g/dL (ref 30.0–36.0)
MCV: 85.5 fL (ref 80.0–100.0)
Monocytes Absolute: 0.6 10*3/uL (ref 0.1–1.0)
Monocytes Relative: 14 %
Neutro Abs: 3.6 10*3/uL (ref 1.7–7.7)
Neutrophils Relative %: 76 %
Platelets: 157 10*3/uL (ref 150–400)
RBC: 4.88 MIL/uL (ref 4.22–5.81)
RDW: 16.3 % — ABNORMAL HIGH (ref 11.5–15.5)
WBC: 4.7 10*3/uL (ref 4.0–10.5)
nRBC: 0 % (ref 0.0–0.2)

## 2024-01-20 LAB — MAGNESIUM: Magnesium: 1.7 mg/dL (ref 1.7–2.4)

## 2024-01-20 MED ORDER — SODIUM CHLORIDE 0.9 % IV SOLN
45.0000 mg/m2 | Freq: Once | INTRAVENOUS | Status: AC
  Administered 2024-01-20: 96 mg via INTRAVENOUS
  Filled 2024-01-20: qty 16

## 2024-01-20 MED ORDER — SODIUM CHLORIDE 0.9 % IV SOLN: INTRAVENOUS | Status: DC

## 2024-01-20 MED ORDER — DEXAMETHASONE SODIUM PHOSPHATE 10 MG/ML IJ SOLN
10.0000 mg | Freq: Once | INTRAMUSCULAR | Status: AC
  Administered 2024-01-20: 10 mg via INTRAVENOUS
  Filled 2024-01-20: qty 1

## 2024-01-20 MED ORDER — SODIUM CHLORIDE 0.9% FLUSH
10.0000 mL | Freq: Once | INTRAVENOUS | Status: AC
  Administered 2024-01-20: 10 mL via INTRAVENOUS

## 2024-01-20 MED ORDER — CETIRIZINE HCL 10 MG/ML IV SOLN
10.0000 mg | Freq: Once | INTRAVENOUS | Status: AC
Start: 2024-01-20 — End: 2024-01-20
  Administered 2024-01-20: 10 mg via INTRAVENOUS
  Filled 2024-01-20: qty 1

## 2024-01-20 MED ORDER — SODIUM CHLORIDE 0.9% FLUSH
10.0000 mL | INTRAVENOUS | Status: DC | PRN
  Administered 2024-01-20: 10 mL

## 2024-01-20 MED ORDER — SODIUM CHLORIDE 0.9 % IV SOLN
220.0000 mg | Freq: Once | INTRAVENOUS | Status: AC
  Administered 2024-01-20: 220 mg via INTRAVENOUS
  Filled 2024-01-20: qty 22

## 2024-01-20 MED ORDER — FAMOTIDINE IN NACL 20-0.9 MG/50ML-% IV SOLN
20.0000 mg | Freq: Once | INTRAVENOUS | Status: AC
  Administered 2024-01-20: 20 mg via INTRAVENOUS
  Filled 2024-01-20: qty 50

## 2024-01-20 MED ORDER — HEPARIN SOD (PORK) LOCK FLUSH 100 UNIT/ML IV SOLN
500.0000 [IU] | Freq: Once | INTRAVENOUS | Status: AC | PRN
  Administered 2024-01-20: 500 [IU]

## 2024-01-20 MED ORDER — PALONOSETRON HCL INJECTION 0.25 MG/5ML
0.2500 mg | Freq: Once | INTRAVENOUS | Status: AC
  Administered 2024-01-20: 0.25 mg via INTRAVENOUS
  Filled 2024-01-20: qty 5

## 2024-01-20 NOTE — Progress Notes (Signed)
 [facility] [address] [place] [address]   Clinic Day:  01/20/2024  Referring physician: Meldon Sport, MD  Patient Care Team: Meldon Sport, MD as PCP - General (Internal Medicine) [name], MD as Medical Oncologist (Medical Oncology) Gerhard Knuckles, RN as Oncology Nurse Navigator (Medical Oncology)   ASSESSMENT & PLAN:   Assessment:  1.  Recurrent adenocarcinoma of the lung: - Stage Ia (T1b N0 M0) adenocarcinoma of the left upper lobe, s/p left VATS, left upper lobectomy with mediastinal lymph node dissection by Dr. Luna Salinas on 08/01/2016 - Stage Ia moderately differentiated adenocarcinoma, 0.7 cm of the right upper lobe, s/p right VATS with wedge resection of right upper lobe nodule and bleb resection by Dr. Luna Salinas on 12/09/2018 - PET scan (08/19/2023): Dysregulation artifact degrades image quality.  Recurrent bronchogenic carcinoma in the medial right upper lobe along the resection margin with no evidence of distant metastatic disease.  Faint 6 mm nodule in the superior segment right lower lobe too small for PET resolution.  Focal hypermetabolism in the central prostate. - CT super D chest (10/21/2023): Solid 2.6 cm central right upper lobe pulmonary nodule along the wedge resection suture line.  Solid 1.3 cm and 0.7 cm peripheral right lower lobe lung nodules. - MRI brain (12/02/2023): Without IV contrast with no strong evidence of cerebral metastatic disease. - Right paratracheal lymph node FNA (10/24/2023): Adenocarcinoma - Foundation 1 CDX: Insufficient tissue - PD-L1 22 C3 (TPS): 5% - Chemoradiation therapy with weekly carboplatin  and paclitaxel  started on 12/23/2023  2.  Social/family history: - He lives at home with his wife Edwina Gram.  He had a stroke in 2022 which has caused short-term memory loss, loss of right-sided peripheral vision and balance problems.  He currently walks with a cane and sometimes walker.  He is also on  continuous oxygen  since hospitalization in December 2024.  Most of the day he spends time sitting in the chair.  He can wash dishes but does require some help with some of the ADLs.  Quit smoking in 2017. - Mother and sister had ovarian cancer.  Half brother had lung cancer.   Plan:  1.  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 - Denies any GI side effects.  He felt more tired during the weekend.  He also reported dizziness.  Decrease in taste sensation noted.  No dysphagia or odynophagia. - Reviewed labs today: Normal LFTs and creatinine.  CBC grossly normal. - He may proceed with week 5 treatment today.  I will give him 1 L normal saline for dizziness.  Return to clinic in 1 week for his last chemotherapy.  He will complete XRT on 01/30/2024.  2.  Hoarseness: - Hoarseness since December and gets worse in the afternoons.  We have made referral to ENT.  3.  Peripheral neuropathy: - Numbness in the fingertips of both hands, right more than left since stroke has been stable.  No worsening with chemotherapy.  4.  Hypertension/A-fib: - He is on Cardizem  180 mg daily.  Last week we held his Toprol -XL. - Blood pressure today is 113/75 with a heart rate of 108.  Heart rate is irregular.  We will discontinue Cardizem  and restart Toprol -XL daily starting tomorrow.   No orders of the defined types were placed in this encounter.     [name],acting as a Neurosurgeon for [name], MD.,have documented all relevant documentation on the  behalf of [name], MD,as directed by  [name], MD while in the presence of [name], MD.  I, [name] MD, have reviewed the above documentation for accuracy and completeness, and I agree with the above.      [name], MD   4/22/202510:34 AM  CHIEF COMPLAINT/PURPOSE OF CONSULT:   Diagnosis: Recurrent stage III adenocarcinoma    Cancer Staging  Primary lung adenocarcinoma (HCC) Staging form: Lung, AJCC 8th Edition - Clinical: Stage IA2 (cT1b, cN0, cM0) - Signed by Marlene Simas, MD on 12/04/2020  Recurrent non-small cell lung cancer (NSCLC) (HCC) Staging form: Lung, AJCC V9 - Clinical stage from 12/03/2023: Stage IA3 (rcT1c, cN0, cM0) - Unsigned    Prior Therapy: Left upper lobectomy (2017) and right upper lobectomy (2020)  Current Therapy: Concurrent chemoradiation therapy   HISTORY OF PRESENT ILLNESS:   Oncology History  Primary lung adenocarcinoma (HCC)  10/15/2016 Initial Diagnosis   Primary lung adenocarcinoma (HCC)   12/04/2020 Cancer Staging   Staging form: Lung, AJCC 8th Edition - Clinical: Stage IA2 (cT1b, cN0, cM0) - Signed by Marlene Simas, MD on 12/04/2020   Recurrent non-small cell lung cancer (NSCLC) (HCC)  12/03/2023 Initial Diagnosis   Recurrent non-small cell lung cancer (NSCLC) (HCC)   12/23/2023 -  Chemotherapy   Patient is on Treatment Plan : LUNG Carboplatin  + Paclitaxel  + XRT q7d         Randall Sullivan is a 73 y.o. male presenting to clinic today for evaluation of recurrent adenocarcinoma of right lung, non small cell type at the request of Dr. Marguerita Shih.  Coy was first diagnosed in 2017 with Stage IA (T1b, N0, M0) non-small cell lung cancer, well-differentiated adenocarcinoma presented with left upper lobe pulmonary nodule. He then underwent left VATS and left upper lobectomy with mediastinal lymph node dissection under Dr. Luna Salinas on 08/01/2016. Randall Sullivan has since been under surveillance for left lung adenocarcinoma.  He was diagnosed with stage Ia non-small cell lung cancer in March 2020 and underwent right VATS with wedge resection of right upper lobe nodule and bleb resection under Dr. Luna Salinas on 12/10/2018. He was under observation until a CT scan on 07/08/23 found an increasing mass like soft tissue along the suture margin in the central right upper lobe. PET on 08/19/23 found: recurrent  bronchogenic carcinoma in the medial right upper lobe along the resection margin with faint 0.6 cm nodule in the superior segment of the right lower lobe too small for PET resolution.   Drexler then had a bronchoscopy on 10/24/23 with right paratracheal lymph node biopsy and endobronchial valve replacement in the anterior apical, posterior segments of right upper lobe, and superior segment of right lower lobe. Pathology of lymph node biopsy showed: adenocarcinoma. Molecular pathology showed a PDL1 score of 5%.   His cancer is now considered Stage III NSCLC adenocarcinoma due to right paratracheal lymph node involvement. Tymothy had discussion of treatment with Dr. Marguerita Shih on 11/26/23 that consisted of weekly mild chemotherapy with adjunct radiation at [facility] with their side effects.   MRI brain done on 12/02/23 showed: No strong evidence of cerebral metastatic disease on this noncontrast exam. 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 Encephalomalacia and mild hemosiderin. No acute intracranial abnormality identified.  Randall Sullivan has a history of stroke, with symptoms of fatigue, weakness, vision changes, and hoarseness. He also has pertinent history of COPD and requires at home oxygen .  Today, he states that he is doing well overall. His appetite level is at 100%.  His energy level is at 50%.  INTERVAL HISTORY:   Randall Sullivan is a 73 y.o. male presenting to the clinic today for follow-up of recurrent stage III NSCLC. He was last seen by me on 01/13/24.  Today, he states that he is doing well overall. His appetite level is at 75%. His energy level is at 25%. Randall Sullivan is accompanied by his wife.  He denies any new onset pains, dysphagia, nausea, vomiting, dehydration, or diarrhea. He is eating and drinking well, though Deaaron reports decreased taste. Peripheral neuropathy in the form of numbness and tingling in the right hand and right foot due to past stroke is stable. He finishes radiation therapy  on 01/30/24.   His wife notes Juliano had fatigue and dizziness over the weekend. His dizziness has not resolved. He also reports elevated blood pressure at home today with systolic pressure at 146, as well as tachycardia. Camillo has discontinued metoprolol . He has taken Cardizem  today.   PAST MEDICAL HISTORY:   Past Medical History: Past Medical History:  Diagnosis Date   A-fib (HCC) 2017   after last lung surgery patient had a history if Afib documented in hospital    AAA (abdominal aortic aneurysm) (HCC)    4.0 cm 09/17/22 US    Acute appendicitis 07/14/2021   Arthritis    back & legs ( knees)   Cancer (HCC)    lung   Complication of anesthesia    agitated upon waking from anesth.    Dyspnea    Dysrhythmia    A. Fib   Emphysema    Emphysema of lung (HCC)    History of kidney stones    Hyperlipidemia    Hypertension    Oxygen  dependent    2L continuous   Peripheral vascular disease (HCC)    Aortic Aneurysm   Pneumonia    Pre-diabetes    Sleep apnea    No OSA, but wears 2L [place] O2 continuous including at night   Stroke [employer]) 2022   Right sided weakness, decreased hearing, balance and memory loss   Tobacco abuse     Surgical History: Past Surgical History:  Procedure Laterality Date   HERNIA REPAIR Bilateral 1999   umbilical   IR IMAGING GUIDED PORT INSERTION  12/09/2023   LAPAROSCOPIC APPENDECTOMY N/A 07/15/2021   Procedure: APPENDECTOMY LAPAROSCOPIC;  Surgeon: Awilda Bogus, MD;  Location: [facility];  Service: General;  Laterality: N/A;   VIDEO ASSISTED THORACOSCOPY Right 12/21/2018   Procedure: VIDEO ASSISTED THORACOSCOPY AND RESECTION OF BLEBS RIGHT LOWER LOBE;  Surgeon: Zelphia Higashi, MD;  Location: [facility] OR;  Service: Thoracic;  Laterality: Right;   VIDEO ASSISTED THORACOSCOPY (VATS)/ LOBECTOMY Left 08/01/2016   Procedure: VIDEO ASSISTED THORACOSCOPY (VATS)/ LEFT UPPER LOBECTOMY with lymph node sampling and onQ placement;  Surgeon: Zelphia Higashi, MD;   Location: [facility] OR;  Service: Thoracic;  Laterality: Left;   VIDEO ASSISTED THORACOSCOPY (VATS)/WEDGE RESECTION Right 12/09/2018   Procedure: VIDEO ASSISTED THORACOSCOPY (VATS)/WEDGE RESECTION;  Surgeon: Zelphia Higashi, MD;  Location: [facility] OR;  Service: Thoracic;  Laterality: Right;   VIDEO BRONCHOSCOPY Bilateral 12/21/2018   Procedure: VIDEO BRONCHOSCOPY;  Surgeon: Zelphia Higashi, MD;  Location: [facility] OR;  Service: Thoracic;  Laterality: Bilateral;   VIDEO BRONCHOSCOPY WITH ENDOBRONCHIAL NAVIGATION N/A 07/03/2016   Procedure: VIDEO BRONCHOSCOPY WITH ENDOBRONCHIAL NAVIGATION;  Surgeon: Zelphia Higashi, MD;  Location: [facility];  Service: Thoracic;  Laterality: N/A;   VIDEO BRONCHOSCOPY WITH ENDOBRONCHIAL NAVIGATION N/A 10/24/2023   Procedure:  VIDEO BRONCHOSCOPY WITH ENDOBRONCHIAL NAVIGATION;  Surgeon: Zelphia Higashi, MD;  Location: [facility] OR;  Service: Thoracic;  Laterality: N/A;   VIDEO BRONCHOSCOPY WITH ENDOBRONCHIAL ULTRASOUND N/A 10/24/2023   Procedure: VIDEO BRONCHOSCOPY WITH ENDOBRONCHIAL ULTRASOUND;  Surgeon: Zelphia Higashi, MD;  Location: [facility];  Service: Thoracic;  Laterality: N/A;   VIDEO BRONCHOSCOPY WITH INSERTION OF INTERBRONCHIAL VALVE (IBV) N/A 12/11/2018   Procedure: VIDEO BRONCHOSCOPY WITH INSERTION OF INTERBRONCHIAL VALVE (IBV);  Surgeon: Zelphia Higashi, MD;  Location: [facility] OR;  Service: Thoracic;  Laterality: N/A;   VIDEO BRONCHOSCOPY WITH INSERTION OF INTERBRONCHIAL VALVE (IBV) N/A 02/10/2019   Procedure: VIDEO BRONCHOSCOPY WITH REMOVAL OF INTERBRONCHIAL VALVE (IBV);  Surgeon: Zelphia Higashi, MD;  Location: [facility] OR;  Service: Thoracic;  Laterality: N/A;    Social History: Social History   Socioeconomic History   Marital status: Married    Spouse name: Not on file   Number of children: 2   Years of education: Not on file   Highest education level: 9th grade  Occupational History   Occupation: Vinyl siding   Occupation: Retired  Tobacco Use    Smoking status: Former    Current packs/day: 0.00    Average packs/day: 1 pack/day for 40.0 years (40.0 ttl pk-yrs)    Types: Cigarettes    Start date: 05/28/1976    Quit date: 05/28/2016    Years since quitting: 7.6   Smokeless tobacco: Never  Vaping Use   Vaping status: Never Used  Substance and Sexual Activity   Alcohol use: Not Currently    Comment: quit- 2016   Drug use: No   Sexual activity: Not Currently  Other Topics Concern   Not on file  Social History Narrative   Married for 20 years,second.Lives with wife. 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   Social Drivers of Corporate investment banker Strain: High Risk (12/10/2023)   Overall Financial Resource Strain (CARDIA)    Difficulty of Paying Living Expenses: Very hard  Food Insecurity: No Food Insecurity (09/17/2023)   Hunger Vital Sign    Worried About Running Out of Food in the Last Year: Never true    Ran Out of Food in the Last Year: Never true  Transportation Needs: No Transportation Needs (09/17/2023)   PRAPARE - Administrator, Civil Service (Medical): No    Lack of Transportation (Non-Medical): No  Physical Activity: Insufficiently Active (07/11/2023)   Exercise Vital Sign    Days of Exercise per Week: 3 days    Minutes of Exercise per Session: 10 min  Stress: Patient Declined (07/11/2023)   [employer] of Occupational Health - Occupational Stress Questionnaire    Feeling of Stress : Patient declined  Social Connections: Unknown (07/11/2023)   Social Connection and Isolation Panel [NHANES]    Frequency of Communication with Friends and Family: Twice a week    Frequency of Social Gatherings with Friends and Family: Once a week    Attends Religious Services: Patient declined    Database administrator or Organizations: No    Attends Engineer, structural: Not on file    Marital Status: Married  Intimate Partner Violence: Patient Unable To Answer (09/17/2023)   Humiliation, Afraid,  Rape, and Kick questionnaire    Fear of Current or Ex-Partner: Patient unable to answer    Emotionally Abused: Patient unable to answer    Physically Abused: Patient unable to answer    Sexually Abused: Patient unable to answer    Family History: Family  History  Problem Relation Age of Onset   Cancer Mother    Cancer Sister    Aneurysm Sister    Kidney disease Paternal Aunt    Asthma Brother    Cancer - Lung Brother    Cancer Brother    Arthritis Brother     Current Medications:  Current Outpatient Medications:    acetaminophen  (TYLENOL ) 325 MG tablet, Take 2 tablets (650 mg total) by mouth every 6 (six) hours as needed for mild pain (or Fever >/= 101)., Disp: , Rfl:    ALPRAZolam  (XANAX ) 0.5 MG tablet, Take 0.5 mg by mouth daily., Disp: , Rfl:    Budeson-Glycopyrrol-Formoterol  (BREZTRI AEROSPHERE) 160-9-4.8 MCG/ACT AERO, Inhale 2 puffs into the lungs in the morning and at bedtime., Disp: , Rfl:    cetirizine  (ZYRTEC ) 10 MG tablet, Take 10 mg by mouth daily., Disp: , Rfl:    Cholecalciferol  (VITAMIN D3) 125 MCG (5000 UT) CAPS, Take 1 capsule (5,000 Units total) by mouth daily., Disp: 30 capsule, Rfl: 0   diltiazem  (CARDIZEM  CD) 180 MG 24 hr capsule, Take 1 capsule (180 mg total) by mouth daily., Disp: 90 capsule, Rfl: 3   fenofibrate  (TRICOR ) 145 MG tablet, Take 1 tablet (145 mg total) by mouth daily., Disp: 90 tablet, Rfl: 1   finasteride  (PROSCAR ) 5 MG tablet, Take 1 tablet (5 mg total) by mouth daily., Disp: 30 tablet, Rfl: 3   guaiFENesin  (MUCINEX ) 600 MG 12 hr tablet, Take by mouth 2 (two) times daily., Disp: , Rfl:    ipratropium-albuterol  (DUONEB) 0.5-2.5 (3) MG/3ML SOLN, USE 1 AMPULE IN NEBULIZER 4 TIMES DAILY AND AS NEEDED FOR SHORTNESS OF BREATH AND FOR WHEEZING, Disp: 450 mL, Rfl: 0   lidocaine -prilocaine  (EMLA ) cream, Apply to affected area once, Disp: 30 g, Rfl: 3   meclizine  (ANTIVERT ) 25 MG tablet, Take 1 tablet (25 mg total) by mouth 3 (three) times daily as needed  for dizziness., Disp: 30 tablet, Rfl: 0   Omega-3 Fatty Acids (FISH OIL ULTRA PO), Take 1,200 mg by mouth in the morning and at bedtime., Disp: , Rfl:    OXYGEN , Inhale 2 L into the lungs continuous. Hx of lung ca, copd, and emphysema, Disp: , Rfl:    Palonosetron  HCl (ALOXI  IV), Inject into the vein., Disp: , Rfl:    pantoprazole  (PROTONIX ) 40 MG tablet, Take 1 tablet (40 mg total) by mouth daily., Disp: 30 tablet, Rfl: 1   polyethylene glycol (MIRALAX  / GLYCOLAX ) 17 g packet, Take 17 g by mouth daily as needed for mild constipation., Disp: , Rfl:    prochlorperazine  (COMPAZINE ) 10 MG tablet, Take 1 tablet (10 mg total) by mouth every 6 (six) hours as needed for nausea or vomiting., Disp: 60 tablet, Rfl: 3   rosuvastatin  (CRESTOR ) 40 MG tablet, Take 1 tablet (40 mg total) by mouth daily., Disp: 90 tablet, Rfl: 3   tamsulosin  (FLOMAX ) 0.4 MG CAPS capsule, Take 2 capsules (0.8 mg total) by mouth daily., Disp: 180 capsule, Rfl: 3   warfarin (COUMADIN ) 5 MG tablet, Take 1 tablet (5 mg total) by mouth daily. (Patient taking differently: Take 2.5-5 mg by mouth See admin instructions. Take 2.5 mg by mouth Tuesday and Friday and take 5 mg on Monday, Wednesday, Thursday, Saturday and Sunday), Disp: 90 tablet, Rfl: 1   metoprolol  succinate (TOPROL  XL) 50 MG 24 hr tablet, Take 1 tablet (50 mg total) by mouth daily. Take with or immediately following a meal. (Patient not taking: Reported on 01/20/2024), Disp: 30  tablet, Rfl: 2 No current facility-administered medications for this visit.  Facility-Administered Medications Ordered in Other Visits:    0.9 %  sodium chloride  infusion, , Intravenous, Continuous, [name], MD   0.9 %  sodium chloride  infusion, , Intravenous, Continuous, Katti Pelle, MD   CARBOplatin  (PARAPLATIN ) 220 mg in sodium chloride  0.9 % 100 mL chemo infusion, 220 mg, Intravenous, Once, [name], MD   cetirizine  (QUZYTTIR ) injection 10 mg, 10 mg, Intravenous,  Once, Seletha Zimmermann, MD   dexamethasone  (DECADRON ) injection 10 mg, 10 mg, Intravenous, Once, [name], MD   famotidine  (PEPCID ) IVPB 20 mg premix, 20 mg, Intravenous, Once, [name], MD   heparin  lock flush 100 unit/mL, 500 Units, Intracatheter, Once PRN, Wylder Macomber, MD   PACLitaxel  (TAXOL ) 96 mg in sodium chloride  0.9 % 250 mL chemo infusion (</= 80mg /m2), 45 mg/m2 (Treatment Plan Recorded), Intravenous, Once, [name], MD   palonosetron  (ALOXI ) injection 0.25 mg, 0.25 mg, Intravenous, Once, [name], MD   sodium chloride  flush (NS) 0.9 % injection 10 mL, 10 mL, Intracatheter, PRN, Clois Montavon, MD   Allergies: No Known Allergies  REVIEW OF SYSTEMS:   Review of Systems  Constitutional:  Positive for fatigue. Negative for chills and fever.  HENT:   Negative for lump/mass, mouth sores, nosebleeds, sore throat and trouble swallowing.   Eyes:  Negative for eye problems.  Respiratory:  Positive for cough and shortness of breath.   Cardiovascular:  Negative for chest pain, leg swelling and palpitations.  Gastrointestinal:  Negative for abdominal pain, constipation, diarrhea, nausea and vomiting.  Genitourinary:  Negative for bladder incontinence, difficulty urinating, dysuria, frequency, hematuria and nocturia.   Musculoskeletal:  Negative for arthralgias, back pain, flank pain, myalgias and neck pain.  Skin:  Negative for itching and rash.  Neurological:  Positive for dizziness and numbness (in hands and feet). Negative for headaches.  Hematological:  Does not bruise/bleed easily.  Psychiatric/Behavioral:  Negative for depression, sleep disturbance and suicidal ideas. The patient is not nervous/anxious.   All other systems reviewed and are negative.    VITALS:   There were no vitals taken for this visit.  Wt Readings from Last 3 Encounters:  01/20/24 215 lb 6.4 oz (97.7 kg)  01/13/24 219 lb 11.2 oz (99.7 kg)   01/06/24 218 lb 7.6 oz (99.1 kg)    There is no height or weight on file to calculate BMI.  Performance status (ECOG): 1 - Symptomatic but completely ambulatory  PHYSICAL EXAM:   Physical Exam Vitals and nursing note reviewed. Exam conducted with a chaperone present.  Constitutional:      Appearance: Normal appearance.  Cardiovascular:     Rate and Rhythm: Normal rate and regular rhythm.     Pulses: Normal pulses.     Heart sounds: Normal heart sounds.  Pulmonary:     Effort: Pulmonary effort is normal.     Breath sounds: Normal breath sounds.  Abdominal:     Palpations: Abdomen is soft. There is no hepatomegaly, splenomegaly or mass.     Tenderness: There is no abdominal tenderness.  Musculoskeletal:     Right lower leg: No edema.     Left lower leg: No edema.  Lymphadenopathy:     Cervical: No cervical adenopathy.     Right cervical: No superficial, deep or posterior cervical adenopathy.    Left cervical: No superficial, deep or posterior cervical adenopathy.     Upper Body:     Right upper body: No supraclavicular or axillary  adenopathy.     Left upper body: No supraclavicular or axillary adenopathy.  Neurological:     General: No focal deficit present.     Mental Status: He is alert and oriented to person, place, and time.  Psychiatric:        Mood and Affect: Mood normal.        Behavior: Behavior normal.     LABS:   CBC    Component Value Date/Time   WBC 4.7 01/20/2024 0930   RBC 4.88 01/20/2024 0930   HGB 13.1 01/20/2024 0930   HGB 13.6 11/26/2023 1040   HGB 14.8 07/15/2023 1142   HGB 13.5 04/09/2017 1249   HCT 41.7 01/20/2024 0930   HCT 48.2 07/15/2023 1142   HCT 42.4 04/09/2017 1249   PLT 157 01/20/2024 0930   PLT 197 11/26/2023 1040   PLT 205 07/15/2023 1142   MCV 85.5 01/20/2024 0930   MCV 86 07/15/2023 1142   MCV 84.8 04/09/2017 1249   MCH 26.8 01/20/2024 0930   MCHC 31.4 01/20/2024 0930   RDW 16.3 (H) 01/20/2024 0930   RDW 13.1 07/15/2023  1142   RDW 15.4 (H) 04/09/2017 1249   LYMPHSABS 0.4 (L) 01/20/2024 0930   LYMPHSABS 1.1 07/31/2021 1026   LYMPHSABS 1.0 04/09/2017 1249   MONOABS 0.6 01/20/2024 0930   MONOABS 1.9 (H) 04/09/2017 1249   EOSABS 0.0 01/20/2024 0930   EOSABS 0.1 07/31/2021 1026   BASOSABS 0.0 01/20/2024 0930   BASOSABS 0.0 07/31/2021 1026   BASOSABS 0.0 04/09/2017 1249    CMP    Component Value Date/Time   NA 135 01/20/2024 0930   NA 140 07/15/2023 1142   NA 136 04/09/2017 1249   K 4.2 01/20/2024 0930   K 4.6 04/09/2017 1249   CL 102 01/20/2024 0930   CO2 23 01/20/2024 0930   CO2 27 04/09/2017 1249   GLUCOSE 117 (H) 01/20/2024 0930   GLUCOSE 103 04/09/2017 1249   BUN 23 01/20/2024 0930   BUN 13 07/15/2023 1142   BUN 23.9 04/09/2017 1249   CREATININE 1.10 01/20/2024 0930   CREATININE 1.10 11/26/2023 1040   CREATININE 1.21 (H) 05/08/2020 1051   CREATININE 1.4 (H) 04/09/2017 1249   CALCIUM  9.2 01/20/2024 0930   CALCIUM  9.6 04/09/2017 1249   PROT 6.8 01/20/2024 0930   PROT 6.9 03/04/2023 1515   PROT 7.8 04/09/2017 1249   ALBUMIN  3.7 01/20/2024 0930   ALBUMIN  4.6 03/04/2023 1515   ALBUMIN  3.6 04/09/2017 1249   AST 22 01/20/2024 0930   AST 20 11/26/2023 1040   AST 13 04/09/2017 1249   ALT 25 01/20/2024 0930   ALT 19 11/26/2023 1040   ALT 9 04/09/2017 1249   ALKPHOS 31 (L) 01/20/2024 0930   ALKPHOS 58 04/09/2017 1249   BILITOT 1.0 01/20/2024 0930   BILITOT 0.7 11/26/2023 1040   BILITOT 1.01 04/09/2017 1249   GFRNONAA >60 01/20/2024 0930   GFRNONAA >60 11/26/2023 1040   GFRNONAA 60 05/08/2020 1051   GFRAA >60 06/02/2020 1239   GFRAA 70 05/08/2020 1051     No results found for: "CEA1", "CEA" / No results found for: "CEA1", "CEA" Lab Results  Component Value Date   PSA1 3.5 07/05/2021   No results found for: "ZOX096" No results found for: "CAN125"  No results found for: "TOTALPROTELP", "ALBUMINELP", "A1GS", "A2GS", "BETS", "BETA2SER", "GAMS", "MSPIKE", "SPEI" No results found  for: "TIBC", "FERRITIN", "IRONPCTSAT" No results found for: "LDH"   STUDIES:   No results found.

## 2024-01-20 NOTE — Patient Instructions (Signed)
 [facility] [place] - A DEPT OF [facility]  Discharge Instructions: Thank you for choosing [facility] to provide your oncology and hematology care.  If you have a lab appointment with the Cancer Center - please note that after April 8th, 2024, all labs will be drawn in the cancer center.  You do not have to check in or register with the main entrance as you have in the past but will complete your check-in in the cancer center.  Wear comfortable clothing and clothing appropriate for easy access to any Portacath or PICC line.   We strive to give you quality time with your provider. You may need to reschedule your appointment if you arrive late (15 or more minutes).  Arriving late affects you and other patients whose appointments are after yours.  Also, if you miss three or more appointments without notifying the office, you may be dismissed from the clinic at the provider's discretion.      For prescription refill requests, have your pharmacy contact our office and allow 72 hours for refills to be completed.    Today you received the following chemotherapy and/or immunotherapy agents Taxol and Carboplatin, return as scheduled.   To help prevent nausea and vomiting after your treatment, we encourage you to take your nausea medication as directed.  BELOW ARE SYMPTOMS THAT SHOULD BE REPORTED IMMEDIATELY: *FEVER GREATER THAN 100.4 F (38 C) OR HIGHER *CHILLS OR SWEATING *NAUSEA AND VOMITING THAT IS NOT CONTROLLED WITH YOUR NAUSEA MEDICATION *UNUSUAL SHORTNESS OF BREATH *UNUSUAL BRUISING OR BLEEDING *URINARY PROBLEMS (pain or burning when urinating, or frequent urination) *BOWEL PROBLEMS (unusual diarrhea, constipation, pain near the anus) TENDERNESS IN MOUTH AND THROAT WITH OR WITHOUT PRESENCE OF ULCERS (sore throat, sores in mouth, or a toothache) UNUSUAL RASH, SWELLING OR PAIN  UNUSUAL VAGINAL DISCHARGE OR ITCHING   Items with * indicate a potential  emergency and should be followed up as soon as possible or go to the Emergency Department if any problems should occur.  Please show the CHEMOTHERAPY ALERT CARD or IMMUNOTHERAPY ALERT CARD at check-in to the Emergency Department and triage nurse.  Should you have questions after your visit or need to cancel or reschedule your appointment, please contact [facility] [place] - A DEPT OF [facility] [phone]  and follow the prompts.  Office hours are 8:00 a.m. to 4:30 p.m. Monday - Friday. Please note that voicemails left after 4:00 p.m. may not be returned until the following business day.  We are closed weekends and major holidays. You have access to a nurse at all times for urgent questions. Please call the main number to the clinic [phone] and follow the prompts.  For any non-urgent questions, you may also contact your provider using MyChart. We now offer e-Visits for anyone 64 and older to request care online for non-urgent symptoms. For details visit [email or web address].   Also download the MyChart app! Go to the app store, search "MyChart", open the app, select [place], and log in with your MyChart username and password.

## 2024-01-20 NOTE — Patient Instructions (Signed)

## 2024-01-20 NOTE — Progress Notes (Signed)
 Patient has been examined by Dr. Ellin Saba. Vital signs (HR 108) and labs have been reviewed by MD - ANC, Creatinine, LFTs, hemoglobin, and platelets are within treatment parameters per M.D. - pt may proceed with treatment.  Primary RN and pharmacy notified.

## 2024-01-20 NOTE — Progress Notes (Signed)
 Patient okay for treatment today per Dr. Cheree Cords, Heart rate 108, additional orders received for 1 L of normal saline over 2 hours. Patient tolerated chemotherapy with no complaints voiced. Side effects with management reviewed understanding verbalized. Port site clean and dry with no bruising or swelling noted at site. Good blood return noted before and after administration of chemotherapy. Band aid applied. Patient left in satisfactory condition with VSS and no s/s of distress noted.

## 2024-01-21 DIAGNOSIS — C771 Secondary and unspecified malignant neoplasm of intrathoracic lymph nodes: Secondary | ICD-10-CM | POA: Diagnosis not present

## 2024-01-21 DIAGNOSIS — I10 Essential (primary) hypertension: Secondary | ICD-10-CM | POA: Diagnosis not present

## 2024-01-21 DIAGNOSIS — Z510 Encounter for antineoplastic radiation therapy: Secondary | ICD-10-CM | POA: Diagnosis not present

## 2024-01-21 DIAGNOSIS — C3411 Malignant neoplasm of upper lobe, right bronchus or lung: Secondary | ICD-10-CM | POA: Diagnosis not present

## 2024-01-21 DIAGNOSIS — Z8673 Personal history of transient ischemic attack (TIA), and cerebral infarction without residual deficits: Secondary | ICD-10-CM | POA: Diagnosis not present

## 2024-01-21 DIAGNOSIS — E785 Hyperlipidemia, unspecified: Secondary | ICD-10-CM | POA: Diagnosis not present

## 2024-01-22 DIAGNOSIS — I10 Essential (primary) hypertension: Secondary | ICD-10-CM | POA: Diagnosis not present

## 2024-01-22 DIAGNOSIS — Z510 Encounter for antineoplastic radiation therapy: Secondary | ICD-10-CM | POA: Diagnosis not present

## 2024-01-22 DIAGNOSIS — C771 Secondary and unspecified malignant neoplasm of intrathoracic lymph nodes: Secondary | ICD-10-CM | POA: Diagnosis not present

## 2024-01-22 DIAGNOSIS — Z8673 Personal history of transient ischemic attack (TIA), and cerebral infarction without residual deficits: Secondary | ICD-10-CM | POA: Diagnosis not present

## 2024-01-22 DIAGNOSIS — C3411 Malignant neoplasm of upper lobe, right bronchus or lung: Secondary | ICD-10-CM | POA: Diagnosis not present

## 2024-01-22 DIAGNOSIS — E785 Hyperlipidemia, unspecified: Secondary | ICD-10-CM | POA: Diagnosis not present

## 2024-01-23 DIAGNOSIS — Z8673 Personal history of transient ischemic attack (TIA), and cerebral infarction without residual deficits: Secondary | ICD-10-CM | POA: Diagnosis not present

## 2024-01-23 DIAGNOSIS — I10 Essential (primary) hypertension: Secondary | ICD-10-CM | POA: Diagnosis not present

## 2024-01-23 DIAGNOSIS — Z510 Encounter for antineoplastic radiation therapy: Secondary | ICD-10-CM | POA: Diagnosis not present

## 2024-01-23 DIAGNOSIS — E785 Hyperlipidemia, unspecified: Secondary | ICD-10-CM | POA: Diagnosis not present

## 2024-01-23 DIAGNOSIS — C3411 Malignant neoplasm of upper lobe, right bronchus or lung: Secondary | ICD-10-CM | POA: Diagnosis not present

## 2024-01-23 DIAGNOSIS — C771 Secondary and unspecified malignant neoplasm of intrathoracic lymph nodes: Secondary | ICD-10-CM | POA: Diagnosis not present

## 2024-01-26 ENCOUNTER — Telehealth: Payer: Self-pay | Admitting: Dietician

## 2024-01-26 ENCOUNTER — Inpatient Hospital Stay: Admitting: Dietician

## 2024-01-26 DIAGNOSIS — Z8673 Personal history of transient ischemic attack (TIA), and cerebral infarction without residual deficits: Secondary | ICD-10-CM | POA: Diagnosis not present

## 2024-01-26 DIAGNOSIS — Z510 Encounter for antineoplastic radiation therapy: Secondary | ICD-10-CM | POA: Diagnosis not present

## 2024-01-26 DIAGNOSIS — C3411 Malignant neoplasm of upper lobe, right bronchus or lung: Secondary | ICD-10-CM | POA: Diagnosis not present

## 2024-01-26 DIAGNOSIS — C771 Secondary and unspecified malignant neoplasm of intrathoracic lymph nodes: Secondary | ICD-10-CM | POA: Diagnosis not present

## 2024-01-26 DIAGNOSIS — I10 Essential (primary) hypertension: Secondary | ICD-10-CM | POA: Diagnosis not present

## 2024-01-26 DIAGNOSIS — E785 Hyperlipidemia, unspecified: Secondary | ICD-10-CM | POA: Diagnosis not present

## 2024-01-26 NOTE — Progress Notes (Signed)
 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 S/p resection by Dr. Luna Salinas (2017). Randall Sullivan receiving concurrent chemoradiation with weekly carbo/taxol  (first 3/25). Randall Sullivan is under the care of Dr. Cheree Cords.   Received return call from wife of Randall Sullivan Randall Sullivan). Randall Sullivan is looking forward to final chemotherapy tomorrow. Wife says Randall Sullivan has been really tired the last 2 weeks. His appetite has been decent. Reports burning in throat with some foods. Randall Sullivan eating at least 2 meals/day. He likes to drink Ensure for snack when he has these. Wife reports Randall Sullivan having nose bleeds recently. They are using saline as directed by nursing per wife. She denies nausea, vomiting, diarrhea, constipation.   Medications: reviewed   Labs: 4/22 - glucose 117, alk phos 31   Anthropometrics: Last wt 215 lb 6.4 oz on 4/22 - trending down  4/1 - 218 lb 0.6 oz 3/25 - 220 lb 6.4 oz   NUTRITION DIAGNOSIS: Unintended wt loss - continues    INTERVENTION:  Discussed strategies for odynophagia - encourage soft moist high protein foods, avoiding citrus/spicy foods  Recommend daily protein shake for added calories and protein  Will leave Ensure Complete samples/coupons + soft moist high protein foods list for p/u at 4/29 [facility] appt    MONITORING, EVALUATION, GOAL: wt trends, intake   NEXT VISIT: To be scheduled as needed

## 2024-01-26 NOTE — Telephone Encounter (Signed)
Attempted to contact patient via telephone for nutrition follow-up. Left VM with request for return call. Contact information provided.  

## 2024-01-27 ENCOUNTER — Other Ambulatory Visit: Payer: Self-pay | Admitting: *Deleted

## 2024-01-27 ENCOUNTER — Inpatient Hospital Stay

## 2024-01-27 ENCOUNTER — Inpatient Hospital Stay (HOSPITAL_BASED_OUTPATIENT_CLINIC_OR_DEPARTMENT_OTHER): Admitting: Hematology

## 2024-01-27 VITALS — Wt 214.3 lb

## 2024-01-27 VITALS — BP 97/74 | HR 84 | Temp 97.6°F | Resp 18

## 2024-01-27 VITALS — BP 130/79 | HR 74 | Temp 97.4°F | Resp 16

## 2024-01-27 DIAGNOSIS — C3491 Malignant neoplasm of unspecified part of right bronchus or lung: Secondary | ICD-10-CM

## 2024-01-27 DIAGNOSIS — Z8673 Personal history of transient ischemic attack (TIA), and cerebral infarction without residual deficits: Secondary | ICD-10-CM | POA: Diagnosis not present

## 2024-01-27 DIAGNOSIS — C3412 Malignant neoplasm of upper lobe, left bronchus or lung: Secondary | ICD-10-CM | POA: Diagnosis not present

## 2024-01-27 DIAGNOSIS — C3411 Malignant neoplasm of upper lobe, right bronchus or lung: Secondary | ICD-10-CM | POA: Diagnosis not present

## 2024-01-27 DIAGNOSIS — Z95828 Presence of other vascular implants and grafts: Secondary | ICD-10-CM

## 2024-01-27 DIAGNOSIS — G629 Polyneuropathy, unspecified: Secondary | ICD-10-CM | POA: Diagnosis not present

## 2024-01-27 DIAGNOSIS — C3490 Malignant neoplasm of unspecified part of unspecified bronchus or lung: Secondary | ICD-10-CM

## 2024-01-27 DIAGNOSIS — R490 Dysphonia: Secondary | ICD-10-CM | POA: Diagnosis not present

## 2024-01-27 DIAGNOSIS — Z510 Encounter for antineoplastic radiation therapy: Secondary | ICD-10-CM | POA: Diagnosis not present

## 2024-01-27 DIAGNOSIS — Z5111 Encounter for antineoplastic chemotherapy: Secondary | ICD-10-CM | POA: Diagnosis not present

## 2024-01-27 DIAGNOSIS — C7801 Secondary malignant neoplasm of right lung: Secondary | ICD-10-CM | POA: Diagnosis not present

## 2024-01-27 DIAGNOSIS — E785 Hyperlipidemia, unspecified: Secondary | ICD-10-CM | POA: Diagnosis not present

## 2024-01-27 DIAGNOSIS — C771 Secondary and unspecified malignant neoplasm of intrathoracic lymph nodes: Secondary | ICD-10-CM | POA: Diagnosis not present

## 2024-01-27 DIAGNOSIS — I10 Essential (primary) hypertension: Secondary | ICD-10-CM | POA: Diagnosis not present

## 2024-01-27 LAB — COMPREHENSIVE METABOLIC PANEL WITH GFR
ALT: 25 U/L (ref 0–44)
AST: 21 U/L (ref 15–41)
Albumin: 3.5 g/dL (ref 3.5–5.0)
Alkaline Phosphatase: 32 U/L — ABNORMAL LOW (ref 38–126)
Anion gap: 8 (ref 5–15)
BUN: 18 mg/dL (ref 8–23)
CO2: 25 mmol/L (ref 22–32)
Calcium: 8.9 mg/dL (ref 8.9–10.3)
Chloride: 103 mmol/L (ref 98–111)
Creatinine, Ser: 0.97 mg/dL (ref 0.61–1.24)
GFR, Estimated: >60 mL/min (ref >60)
Glucose, Bld: 105 mg/dL — ABNORMAL HIGH (ref 70–99)
Potassium: 4 mmol/L (ref 3.5–5.1)
Sodium: 136 mmol/L (ref 135–145)
Total Bilirubin: 1 mg/dL (ref 0.0–1.2)
Total Protein: 6.3 g/dL — ABNORMAL LOW (ref 6.5–8.1)

## 2024-01-27 LAB — CBC WITH DIFFERENTIAL/PLATELET
Abs Immature Granulocytes: 0.02 10*3/uL (ref 0.00–0.07)
Basophils Absolute: 0 10*3/uL (ref 0.0–0.1)
Basophils Relative: 0 %
Eosinophils Absolute: 0 10*3/uL (ref 0.0–0.5)
Eosinophils Relative: 0 %
HCT: 38.1 % — ABNORMAL LOW (ref 39.0–52.0)
Hemoglobin: 12.3 g/dL — ABNORMAL LOW (ref 13.0–17.0)
Immature Granulocytes: 1 %
Lymphocytes Relative: 11 %
Lymphs Abs: 0.4 10*3/uL — ABNORMAL LOW (ref 0.7–4.0)
MCH: 27.6 pg (ref 26.0–34.0)
MCHC: 32.3 g/dL (ref 30.0–36.0)
MCV: 85.4 fL (ref 80.0–100.0)
Monocytes Absolute: 0.3 10*3/uL (ref 0.1–1.0)
Monocytes Relative: 9 %
Neutro Abs: 2.6 10*3/uL (ref 1.7–7.7)
Neutrophils Relative %: 79 %
Platelets: 118 10*3/uL — ABNORMAL LOW (ref 150–400)
RBC: 4.46 MIL/uL (ref 4.22–5.81)
RDW: 16.4 % — ABNORMAL HIGH (ref 11.5–15.5)
WBC: 3.3 10*3/uL — ABNORMAL LOW (ref 4.0–10.5)
nRBC: 0 % (ref 0.0–0.2)

## 2024-01-27 LAB — MAGNESIUM: Magnesium: 1.4 mg/dL — ABNORMAL LOW (ref 1.7–2.4)

## 2024-01-27 MED ORDER — LIDOCAINE VISCOUS HCL 2 % MT SOLN: 15.0000 mL | Freq: Four times a day (QID) | OROMUCOSAL | 2 refills | Status: DC | PRN

## 2024-01-27 MED ORDER — SODIUM CHLORIDE 0.9% FLUSH
10.0000 mL | Freq: Once | INTRAVENOUS | Status: AC
  Administered 2024-01-27: 10 mL via INTRAVENOUS

## 2024-01-27 MED ORDER — HEPARIN SOD (PORK) LOCK FLUSH 100 UNIT/ML IV SOLN
500.0000 [IU] | Freq: Once | INTRAVENOUS | Status: AC | PRN
Start: 2024-01-27 — End: 2024-01-27
  Administered 2024-01-27: 500 [IU]

## 2024-01-27 MED ORDER — MAGNESIUM SULFATE 4 GM/100ML IV SOLN
4.0000 g | Freq: Once | INTRAVENOUS | Status: AC
Start: 2024-01-27 — End: 2024-01-27
  Administered 2024-01-27: 4 g via INTRAVENOUS
  Filled 2024-01-27: qty 100

## 2024-01-27 MED ORDER — SODIUM CHLORIDE 0.9 % IV SOLN
45.0000 mg/m2 | Freq: Once | INTRAVENOUS | Status: AC
  Administered 2024-01-27: 96 mg via INTRAVENOUS
  Filled 2024-01-27: qty 16

## 2024-01-27 MED ORDER — MAGNESIUM OXIDE -MG SUPPLEMENT 400 (240 MG) MG PO TABS: 400.0000 mg | ORAL_TABLET | Freq: Two times a day (BID) | ORAL | 3 refills | Status: DC

## 2024-01-27 MED ORDER — POTASSIUM CHLORIDE IN NACL 20-0.9 MEQ/L-% IV SOLN
Freq: Once | INTRAVENOUS | Status: AC
  Filled 2024-01-27: qty 1000

## 2024-01-27 MED ORDER — CETIRIZINE HCL 10 MG/ML IV SOLN
10.0000 mg | Freq: Once | INTRAVENOUS | Status: AC
  Administered 2024-01-27: 10 mg via INTRAVENOUS
  Filled 2024-01-27: qty 1

## 2024-01-27 MED ORDER — DEXAMETHASONE SODIUM PHOSPHATE 10 MG/ML IJ SOLN
10.0000 mg | Freq: Once | INTRAMUSCULAR | Status: AC
  Administered 2024-01-27: 10 mg via INTRAVENOUS
  Filled 2024-01-27: qty 1

## 2024-01-27 MED ORDER — SODIUM CHLORIDE 0.9% FLUSH
10.0000 mL | INTRAVENOUS | Status: DC | PRN
Start: 2024-01-27 — End: 2024-01-27
  Administered 2024-01-27: 10 mL

## 2024-01-27 MED ORDER — SODIUM CHLORIDE 0.9 % IV SOLN
INTRAVENOUS | Status: DC
Start: 2024-01-27 — End: 2024-01-27

## 2024-01-27 MED ORDER — FAMOTIDINE IN NACL 20-0.9 MG/50ML-% IV SOLN
20.0000 mg | Freq: Once | INTRAVENOUS | Status: AC
  Administered 2024-01-27: 20 mg via INTRAVENOUS
  Filled 2024-01-27: qty 50

## 2024-01-27 MED ORDER — CARBOPLATIN CHEMO INJECTION 450 MG/45ML
224.8000 mg | Freq: Once | INTRAVENOUS | Status: AC
  Administered 2024-01-27: 220 mg via INTRAVENOUS
  Filled 2024-01-27: qty 22

## 2024-01-27 MED ORDER — MAGNESIUM SULFATE 2 GM/50ML IV SOLN: 2.0000 g | Freq: Once | INTRAVENOUS | Status: DC

## 2024-01-27 MED ORDER — ALUMINUM & MAGNESIUM HYDROXIDE 200-200 MG/5ML PO SUSP: 15.0000 mL | Freq: Four times a day (QID) | ORAL | 2 refills | Status: DC | PRN

## 2024-01-27 MED ORDER — PALONOSETRON HCL INJECTION 0.25 MG/5ML
0.2500 mg | Freq: Once | INTRAVENOUS | Status: AC
  Administered 2024-01-27: 0.25 mg via INTRAVENOUS
  Filled 2024-01-27: qty 5

## 2024-01-27 NOTE — Progress Notes (Signed)
 Patient okay for treatment today per Dr. Katragadda.  with additional orders received for house fluids and 4g of magnesium  total. Patient tolerated chemotherapy with no complaints voiced. Side effects with management reviewed understanding verbalized. Port site clean and dry with no bruising or swelling noted at site. Good blood return noted before and after administration of chemotherapy. Band aid applied. Patient left in satisfactory condition with VSS and no s/s of distress noted.

## 2024-01-27 NOTE — Progress Notes (Signed)
 [facility] [address] [place] [address]   Clinic Day:  01/27/2024  Referring physician: Meldon Sport, MD  Patient Care Team: Randall Sport, MD as PCP - General (Internal Medicine) Randall Boros, MD as Medical Oncologist (Medical Oncology) Randall Knuckles, RN as Oncology Nurse Navigator (Medical Oncology)   ASSESSMENT & PLAN:   Assessment:  1.  Recurrent adenocarcinoma of the lung: - Stage Ia (T1b N0 M0) adenocarcinoma of the left upper lobe, s/p left VATS, left upper lobectomy with mediastinal lymph node dissection by Dr. Luna Salinas on 08/01/2016 - Stage Ia moderately differentiated adenocarcinoma, 0.7 cm of the right upper lobe, s/p right VATS with wedge resection of right upper lobe nodule and bleb resection by Dr. Luna Salinas on 12/09/2018 - PET scan (08/19/2023): Dysregulation artifact degrades image quality.  Recurrent bronchogenic carcinoma in the medial right upper lobe along the resection margin with no evidence of distant metastatic disease.  Faint 6 mm nodule in the superior segment right lower lobe too small for PET resolution.  Focal hypermetabolism in the central prostate. - CT super D chest (10/21/2023): Solid 2.6 cm central right upper lobe pulmonary nodule along the wedge resection suture line.  Solid 1.3 cm and 0.7 cm peripheral right lower lobe lung nodules. - MRI brain (12/02/2023): Without IV contrast with no strong evidence of cerebral metastatic disease. - Right paratracheal lymph node FNA (10/24/2023): Adenocarcinoma - Foundation 1 CDX: Insufficient tissue - PD-L1 22 C3 (TPS): 5% - Chemoradiation therapy with weekly carboplatin  and paclitaxel  started on 12/23/2023  2.  Social/family history: - He lives at home with his wife Randall Sullivan.  He had a stroke in 2022 which has caused short-term memory loss, loss of right-sided peripheral vision and balance problems.  He currently walks with a cane and sometimes walker.  He is also on  continuous oxygen  since hospitalization in December 2024.  Most of the day he spends time sitting in the chair.  He can wash dishes but does require some help with some of the ADLs.  Quit smoking in 2017. - Mother and sister had ovarian cancer.  Half brother had lung cancer.   Plan:  1.  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 - He reported difficulty swallowing and occasional pain retrosternally. - We will prescribe him Maalox with lidocaine  to be taken a few minutes prior to eating. - Labs today: White count 3.3 and platelet count 118.  ANC is normal.  LFTs are normal.  He may proceed with his last chemotherapy treatment today.  He is finishing XRT on 02/03/2024.  I plan to repeat CT chest with contrast in 3 weeks and see him back in 4 weeks.  If he gets at least a partial response, he will be considered for immunotherapy consolidation. - He is drinking less fluids.  He will receive IV fluids today with magnesium  which is low.  We will also schedule him for fluids next week.  2.  Hoarseness: - Hoarseness since December which gets worse in the afternoons.  He will see ENT after chemoradiation completed.  3.  Peripheral neuropathy: - Numbness in the fingertips of both hands, right more than left since stroke has been stable.  No worsening with chemotherapy.  4.  Hypertension/A-fib: - Continue Toprol -XL daily.  Heart rate improved to 84.  Blood pressure is still low at 97/74.  Continue to hold Cardizem .  5.  Hypomagnesemia: - Will start  him on magnesium  oxide twice daily.  Will replete IV magnesium  today.   Orders Placed This Encounter  Procedures   CT CHEST W CONTRAST    Standing Status:   Future    Expected Date:   02/17/2024    Expiration Date:   01/26/2025    If indicated for the ordered procedure, I authorize the administration of contrast media per Radiology protocol:   Yes    Does the patient have a contrast media/X-ray  dye allergy?:   No    Preferred imaging location?:   [facility] R Teague,acting as a scribe for Randall Boros, MD.,have documented all relevant documentation on the behalf of Randall Boros, MD,as directed by  Randall Boros, MD while in the presence of Randall Boros, MD.  I, Randall Boros MD, have reviewed the above documentation for accuracy and completeness, and I agree with the above.      Randall Boros, MD   4/29/20253:34 PM  CHIEF COMPLAINT/PURPOSE OF CONSULT:   Diagnosis: Recurrent stage III adenocarcinoma   Cancer Staging  Primary lung adenocarcinoma (HCC) Staging form: Lung, AJCC 8th Edition - Clinical: Stage IA2 (cT1b, cN0, cM0) - Signed by Marlene Simas, MD on 12/04/2020  Recurrent non-small cell lung cancer (NSCLC) (HCC) Staging form: Lung, AJCC V9 - Clinical stage from 12/03/2023: Stage IA3 (rcT1c, cN0, cM0) - Unsigned    Prior Therapy: Left upper lobectomy (2017) and right upper lobectomy (2020)  Current Therapy: Concurrent chemoradiation therapy   HISTORY OF PRESENT ILLNESS:   Oncology History  Primary lung adenocarcinoma (HCC)  10/15/2016 Initial Diagnosis   Primary lung adenocarcinoma (HCC)   12/04/2020 Cancer Staging   Staging form: Lung, AJCC 8th Edition - Clinical: Stage IA2 (cT1b, cN0, cM0) - Signed by Marlene Simas, MD on 12/04/2020   Recurrent non-small cell lung cancer (NSCLC) (HCC)  12/03/2023 Initial Diagnosis   Recurrent non-small cell lung cancer (NSCLC) (HCC)   12/23/2023 -  Chemotherapy   Patient is on Treatment Plan : LUNG Carboplatin  + Paclitaxel  + XRT q7d         Randall Sullivan is a 73 y.o. male presenting to clinic today for evaluation of recurrent adenocarcinoma of right lung, non small cell type at the request of Dr. Marguerita Shih.  Randall Sullivan was first diagnosed in 2017 with Stage IA (T1b, N0, M0) non-small cell lung cancer, well-differentiated adenocarcinoma presented with left upper lobe  pulmonary nodule. He then underwent left VATS and left upper lobectomy with mediastinal lymph node dissection under Dr. Luna Salinas on 08/01/2016. Randall Sullivan has since been under surveillance for left lung adenocarcinoma.  He was diagnosed with stage Ia non-small cell lung cancer in March 2020 and underwent right VATS with wedge resection of right upper lobe nodule and bleb resection under Dr. Luna Salinas on 12/10/2018. He was under observation until a CT scan on 07/08/23 found an increasing mass like soft tissue along the suture margin in the central right upper lobe. PET on 08/19/23 found: recurrent bronchogenic carcinoma in the medial right upper lobe along the resection margin with faint 0.6 cm nodule in the superior segment of the right lower lobe too small for PET resolution.   Quatavious then had a bronchoscopy on 10/24/23 with right paratracheal lymph node biopsy and endobronchial valve replacement in the anterior apical, posterior segments of right upper lobe, and superior segment of right lower lobe. Pathology of lymph node biopsy showed: adenocarcinoma. Molecular pathology showed a PDL1 score of 5%.   His cancer is now  considered Stage III NSCLC adenocarcinoma due to right paratracheal lymph node involvement. Saim had discussion of treatment with Dr. Marguerita Shih on 11/26/23 that consisted of weekly mild chemotherapy with adjunct radiation at [facility] with their side effects.   MRI brain done on 12/02/23 showed: No strong evidence of cerebral metastatic disease on this noncontrast exam. 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 Encephalomalacia and mild hemosiderin. No acute intracranial abnormality identified.  Keyandre has a history of stroke, with symptoms of fatigue, weakness, vision changes, and hoarseness. He also has pertinent history of COPD and requires at home oxygen .  Today, he states that he is doing well overall. His appetite level is at 100%. His energy level is at 50%.  INTERVAL  HISTORY:   Randall Sullivan is a 73 y.o. male presenting to the clinic today for follow-up of recurrent stage III NSCLC. He was last seen by me on 01/20/24.  Today, he states that he is doing well overall. His appetite level is at 100%. His energy level is at 75%. Randall Sullivan is accompanied by his wife.  Randall Sullivan reports right sided abdominal pain. His wife states increased tiredness since 01/24/24, as well as dysphagia with pain on the upper sternum. He has been given pantoprazole  and is willing to try Maalox with lidocaine . He will finish radiation therapy on 02/03/24.   Randall Sullivan has discontinued Cardizem  and restarted Toprol -XL.   PAST MEDICAL HISTORY:   Past Medical History: Past Medical History:  Diagnosis Date   A-fib (HCC) 2017   after last lung surgery patient had a history if Afib documented in hospital    AAA (abdominal aortic aneurysm) (HCC)    4.0 cm 09/17/22 US    Acute appendicitis 07/14/2021   Arthritis    back & legs ( knees)   Cancer (HCC)    lung   Complication of anesthesia    agitated upon waking from anesth.    Dyspnea    Dysrhythmia    A. Fib   Emphysema    Emphysema of lung (HCC)    History of kidney stones    Hyperlipidemia    Hypertension    Oxygen  dependent    2L continuous   Peripheral vascular disease (HCC)    Aortic Aneurysm   Pneumonia    Pre-diabetes    Sleep apnea    No OSA, but wears 2L [place] O2 continuous including at night   Stroke [facility]) 2022   Right sided weakness, decreased hearing, balance and memory loss   Tobacco abuse     Surgical History: Past Surgical History:  Procedure Laterality Date   HERNIA REPAIR Bilateral 1999   umbilical   IR IMAGING GUIDED PORT INSERTION  12/09/2023   LAPAROSCOPIC APPENDECTOMY N/A 07/15/2021   Procedure: APPENDECTOMY LAPAROSCOPIC;  Surgeon: Awilda Bogus, MD;  Location: [facility];  Service: General;  Laterality: N/A;   VIDEO ASSISTED THORACOSCOPY Right 12/21/2018   Procedure: VIDEO ASSISTED THORACOSCOPY AND RESECTION  OF BLEBS RIGHT LOWER LOBE;  Surgeon: Zelphia Higashi, MD;  Location: [facility] OR;  Service: Thoracic;  Laterality: Right;   VIDEO ASSISTED THORACOSCOPY (VATS)/ LOBECTOMY Left 08/01/2016   Procedure: VIDEO ASSISTED THORACOSCOPY (VATS)/ LEFT UPPER LOBECTOMY with lymph node sampling and onQ placement;  Surgeon: Zelphia Higashi, MD;  Location: [facility] OR;  Service: Thoracic;  Laterality: Left;   VIDEO ASSISTED THORACOSCOPY (VATS)/WEDGE RESECTION Right 12/09/2018   Procedure: VIDEO ASSISTED THORACOSCOPY (VATS)/WEDGE RESECTION;  Surgeon: Zelphia Higashi, MD;  Location[facility];  Service: Thoracic;  Laterality: Right;   VIDEO BRONCHOSCOPY Bilateral 12/21/2018   Procedure: VIDEO BRONCHOSCOPY;  Surgeon: Zelphia Higashi, MD;  Location: [facility] OR;  Service: Thoracic;  Laterality: Bilateral;   VIDEO BRONCHOSCOPY WITH ENDOBRONCHIAL NAVIGATION N/A 07/03/2016   Procedure: VIDEO BRONCHOSCOPY WITH ENDOBRONCHIAL NAVIGATION;  Surgeon: Zelphia Higashi, MD;  Location: [facility];  Service: Thoracic;  Laterality: N/A;   VIDEO BRONCHOSCOPY WITH ENDOBRONCHIAL NAVIGATION N/A 10/24/2023   Procedure: VIDEO BRONCHOSCOPY WITH ENDOBRONCHIAL NAVIGATION;  Surgeon: Zelphia Higashi, MD;  Location: [facility] OR;  Service: Thoracic;  Laterality: N/A;   VIDEO BRONCHOSCOPY WITH ENDOBRONCHIAL ULTRASOUND N/A 10/24/2023   Procedure: VIDEO BRONCHOSCOPY WITH ENDOBRONCHIAL ULTRASOUND;  Surgeon: Zelphia Higashi, MD;  Location: [facility] OR;  Service: Thoracic;  Laterality: N/A;   VIDEO BRONCHOSCOPY WITH INSERTION OF INTERBRONCHIAL VALVE (IBV) N/A 12/11/2018   Procedure: VIDEO BRONCHOSCOPY WITH INSERTION OF INTERBRONCHIAL VALVE (IBV);  Surgeon: Zelphia Higashi, MD;  Location: [facility] OR;  Service: Thoracic;  Laterality: N/A;   VIDEO BRONCHOSCOPY WITH INSERTION OF INTERBRONCHIAL VALVE (IBV) N/A 02/10/2019   Procedure: VIDEO BRONCHOSCOPY WITH REMOVAL OF INTERBRONCHIAL VALVE (IBV);  Surgeon: Zelphia Higashi, MD;  Location: [facility] OR;  Service:  Thoracic;  Laterality: N/A;    Social History: Social History   Socioeconomic History   Marital status: Married    Spouse name: Not on file   Number of children: 2   Years of education: Not on file   Highest education level: 9th grade  Occupational History   Occupation: Vinyl siding   Occupation: Retired  Tobacco Use   Smoking status: Former    Current packs/day: 0.00    Average packs/day: 1 pack/day for 40.0 years (40.0 ttl pk-yrs)    Types: Cigarettes    Start date: 05/28/1976    Quit date: 05/28/2016    Years since quitting: 7.6   Smokeless tobacco: Never  Vaping Use   Vaping status: Never Used  Substance and Sexual Activity   Alcohol use: Not Currently    Comment: quit- 2016   Drug use: No   Sexual activity: Not Currently  Other Topics Concern   Not on file  Social History Narrative   Married for 20 years,second.Lives with wife. 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   Social Drivers of Corporate investment banker Strain: High Risk (12/10/2023)   Overall Financial Resource Strain (CARDIA)    Difficulty of Paying Living Expenses: Very hard  Food Insecurity: No Food Insecurity (09/17/2023)   Hunger Vital Sign    Worried About Running Out of Food in the Last Year: Never true    Ran Out of Food in the Last Year: Never true  Transportation Needs: No Transportation Needs (09/17/2023)   PRAPARE - Administrator, Civil Service (Medical): No    Lack of Transportation (Non-Medical): No  Physical Activity: Insufficiently Active (07/11/2023)   Exercise Vital Sign    Days of Exercise per Week: 3 days    Minutes of Exercise per Session: 10 min  Stress: Patient Declined (07/11/2023)   [employer] of Occupational Health - Occupational Stress Questionnaire    Feeling of Stress : Patient declined  Social Connections: Unknown (07/11/2023)   Social Connection and Isolation Panel [NHANES]    Frequency of Communication with Friends and Family: Twice a week     Frequency of Social Gatherings with Friends and Family: Once a week    Attends Religious Services: Patient declined    Database administrator or Organizations: No    Attends [employer]  or Organization Meetings: Not on file    Marital Status: Married  Intimate Partner Violence: Patient Unable To Answer (09/17/2023)   Humiliation, Afraid, Rape, and Kick questionnaire    Fear of Current or Ex-Partner: Patient unable to answer    Emotionally Abused: Patient unable to answer    Physically Abused: Patient unable to answer    Sexually Abused: Patient unable to answer    Family History: Family History  Problem Relation Age of Onset   Cancer Mother    Cancer Sister    Aneurysm Sister    Kidney disease Paternal Aunt    Asthma Brother    Cancer - Lung Brother    Cancer Brother    Arthritis Brother     Current Medications:  Current Outpatient Medications:    acetaminophen  (TYLENOL ) 325 MG tablet, Take 2 tablets (650 mg total) by mouth every 6 (six) hours as needed for mild pain (or Fever >/= 101)., Disp: , Rfl:    ALPRAZolam  (XANAX ) 0.5 MG tablet, Take 0.5 mg by mouth daily., Disp: , Rfl:    Budeson-Glycopyrrol-Formoterol  (BREZTRI AEROSPHERE) 160-9-4.8 MCG/ACT AERO, Inhale 2 puffs into the lungs in the morning and at bedtime., Disp: , Rfl:    cetirizine  (ZYRTEC ) 10 MG tablet, Take 10 mg by mouth daily., Disp: , Rfl:    Cholecalciferol  (VITAMIN D3) 125 MCG (5000 UT) CAPS, Take 1 capsule (5,000 Units total) by mouth daily., Disp: 30 capsule, Rfl: 0   diltiazem  (CARDIZEM  CD) 180 MG 24 hr capsule, Take 1 capsule (180 mg total) by mouth daily., Disp: 90 capsule, Rfl: 3   fenofibrate  (TRICOR ) 145 MG tablet, Take 1 tablet (145 mg total) by mouth daily., Disp: 90 tablet, Rfl: 1   finasteride  (PROSCAR ) 5 MG tablet, Take 1 tablet (5 mg total) by mouth daily., Disp: 30 tablet, Rfl: 3   guaiFENesin  (MUCINEX ) 600 MG 12 hr tablet, Take by mouth 2 (two) times daily., Disp: , Rfl:    ipratropium-albuterol   (DUONEB) 0.5-2.5 (3) MG/3ML SOLN, USE 1 AMPULE IN NEBULIZER 4 TIMES DAILY AND AS NEEDED FOR SHORTNESS OF BREATH AND FOR WHEEZING, Disp: 450 mL, Rfl: 0   lidocaine -prilocaine  (EMLA ) cream, Apply to affected area once, Disp: 30 g, Rfl: 3   magnesium  oxide (MAG-OX) 400 (240 Mg) MG tablet, Take 1 tablet (400 mg total) by mouth 2 (two) times daily., Disp: 60 tablet, Rfl: 3   meclizine  (ANTIVERT ) 25 MG tablet, Take 1 tablet (25 mg total) by mouth 3 (three) times daily as needed for dizziness., Disp: 30 tablet, Rfl: 0   metoprolol  succinate (TOPROL  XL) 50 MG 24 hr tablet, Take 1 tablet (50 mg total) by mouth daily. Take with or immediately following a meal., Disp: 30 tablet, Rfl: 2   Omega-3 Fatty Acids (FISH OIL ULTRA PO), Take 1,200 mg by mouth in the morning and at bedtime., Disp: , Rfl:    OXYGEN , Inhale 2 L into the lungs continuous. Hx of lung ca, copd, and emphysema, Disp: , Rfl:    Palonosetron  HCl (ALOXI  IV), Inject into the vein., Disp: , Rfl:    pantoprazole  (PROTONIX ) 40 MG tablet, Take 1 tablet (40 mg total) by mouth daily., Disp: 30 tablet, Rfl: 1   polyethylene glycol (MIRALAX  / GLYCOLAX ) 17 g packet, Take 17 g by mouth daily as needed for mild constipation., Disp: , Rfl:    prochlorperazine  (COMPAZINE ) 10 MG tablet, Take 1 tablet (10 mg total) by mouth every 6 (six) hours as needed for nausea or vomiting., Disp:  60 tablet, Rfl: 3   rosuvastatin  (CRESTOR ) 40 MG tablet, Take 1 tablet (40 mg total) by mouth daily., Disp: 90 tablet, Rfl: 3   tamsulosin  (FLOMAX ) 0.4 MG CAPS capsule, Take 2 capsules (0.8 mg total) by mouth daily., Disp: 180 capsule, Rfl: 3   warfarin (COUMADIN ) 5 MG tablet, Take 1 tablet (5 mg total) by mouth daily. (Patient taking differently: Take 2.5-5 mg by mouth See admin instructions. Take 2.5 mg by mouth Tuesday and Friday and take 5 mg on Monday, Wednesday, Thursday, Saturday and Sunday), Disp: 90 tablet, Rfl: 1   aluminum-magnesium  hydroxide 200-200 MG/5ML suspension, Take  15 mLs by mouth every 6 (six) hours as needed for indigestion (Mix 1:1 with lidocaine  and swish and spit)., Disp: 355 mL, Rfl: 2   lidocaine  (XYLOCAINE ) 2 % solution, Use as directed 15 mLs in the mouth or throat every 6 (six) hours as needed for mouth pain (Mix 1:1 with Maalox and swish and spit)., Disp: 100 mL, Rfl: 2 No current facility-administered medications for this visit.  Facility-Administered Medications Ordered in Other Visits:    0.9 %  sodium chloride  infusion, , Intravenous, Continuous, Randall Boros, MD, Stopped at 01/27/24 1437   sodium chloride  flush (NS) 0.9 % injection 10 mL, 10 mL, Intracatheter, PRN, Karessa Onorato, MD, 10 mL at 01/27/24 1437   Allergies: No Known Allergies  REVIEW OF SYSTEMS:   Review of Systems  Constitutional:  Negative for chills, fatigue and fever.  HENT:   Negative for lump/mass, mouth sores, nosebleeds, sore throat and trouble swallowing.        +dysphagia  Eyes:  Negative for eye problems.  Respiratory:  Positive for cough and shortness of breath.   Cardiovascular:  Negative for chest pain, leg swelling and palpitations.  Gastrointestinal:  Positive for abdominal pain (right, 7/10 severity), constipation and diarrhea. Negative for nausea and vomiting.  Genitourinary:  Negative for bladder incontinence, difficulty urinating, dysuria, frequency, hematuria and nocturia.   Musculoskeletal:  Negative for arthralgias, back pain, flank pain, myalgias and neck pain.  Skin:  Negative for itching and rash.  Neurological:  Negative for dizziness, headaches and numbness.  Hematological:  Does not bruise/bleed easily.  Psychiatric/Behavioral:  Positive for sleep disturbance. Negative for depression and suicidal ideas. The patient is not nervous/anxious.   All other systems reviewed and are negative.    VITALS:   Weight 214 lb 4.6 oz (97.2 kg).  Wt Readings from Last 3 Encounters:  01/27/24 214 lb 4.6 oz (97.2 kg)  01/20/24 215 lb 6.4 oz  (97.7 kg)  01/13/24 219 lb 11.2 oz (99.7 kg)    Body mass index is 30.75 kg/m.  Performance status (ECOG): 1 - Symptomatic but completely ambulatory  PHYSICAL EXAM:   Physical Exam Vitals and nursing note reviewed. Exam conducted with a chaperone present.  Constitutional:      Appearance: Normal appearance.  Cardiovascular:     Rate and Rhythm: Normal rate and regular rhythm.     Pulses: Normal pulses.     Heart sounds: Normal heart sounds.  Pulmonary:     Effort: Pulmonary effort is normal.     Breath sounds: Normal breath sounds.  Abdominal:     Palpations: Abdomen is soft. There is no hepatomegaly, splenomegaly or mass.     Tenderness: There is no abdominal tenderness.  Musculoskeletal:     Right lower leg: No edema.     Left lower leg: No edema.  Lymphadenopathy:     Cervical: No cervical adenopathy.  Right cervical: No superficial, deep or posterior cervical adenopathy.    Left cervical: No superficial, deep or posterior cervical adenopathy.     Upper Body:     Right upper body: No supraclavicular or axillary adenopathy.     Left upper body: No supraclavicular or axillary adenopathy.  Neurological:     General: No focal deficit present.     Mental Status: He is alert and oriented to person, place, and time.  Psychiatric:        Mood and Affect: Mood normal.        Behavior: Behavior normal.     LABS:   CBC    Component Value Date/Time   WBC 3.3 (L) 01/27/2024 0907   RBC 4.46 01/27/2024 0907   HGB 12.3 (L) 01/27/2024 0907   HGB 13.6 11/26/2023 1040   HGB 14.8 07/15/2023 1142   HGB 13.5 04/09/2017 1249   HCT 38.1 (L) 01/27/2024 0907   HCT 48.2 07/15/2023 1142   HCT 42.4 04/09/2017 1249   PLT 118 (L) 01/27/2024 0907   PLT 197 11/26/2023 1040   PLT 205 07/15/2023 1142   MCV 85.4 01/27/2024 0907   MCV 86 07/15/2023 1142   MCV 84.8 04/09/2017 1249   MCH 27.6 01/27/2024 0907   MCHC 32.3 01/27/2024 0907   RDW 16.4 (H) 01/27/2024 0907   RDW 13.1  07/15/2023 1142   RDW 15.4 (H) 04/09/2017 1249   LYMPHSABS 0.4 (L) 01/27/2024 0907   LYMPHSABS 1.1 07/31/2021 1026   LYMPHSABS 1.0 04/09/2017 1249   MONOABS 0.3 01/27/2024 0907   MONOABS 1.9 (H) 04/09/2017 1249   EOSABS 0.0 01/27/2024 0907   EOSABS 0.1 07/31/2021 1026   BASOSABS 0.0 01/27/2024 0907   BASOSABS 0.0 07/31/2021 1026   BASOSABS 0.0 04/09/2017 1249    CMP    Component Value Date/Time   NA 136 01/27/2024 0907   NA 140 07/15/2023 1142   NA 136 04/09/2017 1249   K 4.0 01/27/2024 0907   K 4.6 04/09/2017 1249   CL 103 01/27/2024 0907   CO2 25 01/27/2024 0907   CO2 27 04/09/2017 1249   GLUCOSE 105 (H) 01/27/2024 0907   GLUCOSE 103 04/09/2017 1249   BUN 18 01/27/2024 0907   BUN 13 07/15/2023 1142   BUN 23.9 04/09/2017 1249   CREATININE 0.97 01/27/2024 0907   CREATININE 1.10 11/26/2023 1040   CREATININE 1.21 (H) 05/08/2020 1051   CREATININE 1.4 (H) 04/09/2017 1249   CALCIUM  8.9 01/27/2024 0907   CALCIUM  9.6 04/09/2017 1249   PROT 6.3 (L) 01/27/2024 0907   PROT 6.9 03/04/2023 1515   PROT 7.8 04/09/2017 1249   ALBUMIN  3.5 01/27/2024 0907   ALBUMIN  4.6 03/04/2023 1515   ALBUMIN  3.6 04/09/2017 1249   AST 21 01/27/2024 0907   AST 20 11/26/2023 1040   AST 13 04/09/2017 1249   ALT 25 01/27/2024 0907   ALT 19 11/26/2023 1040   ALT 9 04/09/2017 1249   ALKPHOS 32 (L) 01/27/2024 0907   ALKPHOS 58 04/09/2017 1249   BILITOT 1.0 01/27/2024 0907   BILITOT 0.7 11/26/2023 1040   BILITOT 1.01 04/09/2017 1249   GFRNONAA >60 01/27/2024 0907   GFRNONAA >60 11/26/2023 1040   GFRNONAA 60 05/08/2020 1051   GFRAA >60 06/02/2020 1239   GFRAA 70 05/08/2020 1051     No results found for: "CEA1", "CEA" / No results found for: "CEA1", "CEA" Lab Results  Component Value Date   PSA1 3.5 07/05/2021   No results found for: "  GNF621" No results found for: "CAN125"  No results found for: "TOTALPROTELP", "ALBUMINELP", "A1GS", "A2GS", "BETS", "BETA2SER", "GAMS", "MSPIKE", "SPEI" No  results found for: "TIBC", "FERRITIN", "IRONPCTSAT" No results found for: "LDH"   STUDIES:   No results found.

## 2024-01-27 NOTE — Progress Notes (Signed)
 Patient has been examined by Dr. Ellin Saba. Vital signs and labs have been reviewed by MD - ANC, Creatinine, LFTs, hemoglobin, and platelets are within treatment parameters per M.D. - pt may proceed with treatment.  Primary RN and pharmacy notified.

## 2024-01-27 NOTE — Patient Instructions (Signed)
 [facility] at [facility] Discharge Instructions   You were seen and examined today by Dr. Cheree Cords.  He reviewed the results of your lab work which are mostly normal/stable. Your magnesium  is very low at 1.4. We will give you IV magnesium  in the clinic today. We will also give a prescription of magnesium  for you to take at home.   We will proceed with your treatment today.   Return as scheduled.    Thank you for choosing  Cancer Center at [facility] to provide your oncology and hematology care.  To afford each patient quality time with our provider, please arrive at least 15 minutes before your scheduled appointment time.   If you have a lab appointment with the Cancer Center please come in thru the Main Entrance and check in at the main information desk.  You need to re-schedule your appointment should you arrive 10 or more minutes late.  We strive to give you quality time with our providers, and arriving late affects you and other patients whose appointments are after yours.  Also, if you no show three or more times for appointments you may be dismissed from the clinic at the providers discretion.     Again, thank you for choosing [facility].  Our hope is that these requests will decrease the amount of time that you wait before being seen by our physicians.       _____________________________________________________________  Should you have questions after your visit to [facility], please contact our office at [phone] and follow the prompts.  Our office hours are 8:00 a.m. and 4:30 p.m. Monday - Friday.  Please note that voicemails left after 4:00 p.m. may not be returned until the following business day.  We are closed weekends and major holidays.  You do have access to a nurse 24-7, just call the main number to the clinic [phone] and do not press any options, hold on the line and a nurse will answer the  phone.    For prescription refill requests, have your pharmacy contact our office and allow 72 hours.    Due to Covid, you will need to wear a mask upon entering the hospital. If you do not have a mask, a mask will be given to you at the Main Entrance upon arrival. For doctor visits, patients may have 1 support person age 39 or older with them. For treatment visits, patients can not have anyone with them due to social distancing guidelines and our immunocompromised population.

## 2024-01-27 NOTE — Patient Instructions (Signed)
 [facility] [place] - A DEPT OF [facility]  Discharge Instructions: Thank you for choosing [facility] to provide your oncology and hematology care.  If you have a lab appointment with the Cancer Center - please note that after April 8th, 2024, all labs will be drawn in the cancer center.  You do not have to check in or register with the main entrance as you have in the past but will complete your check-in in the cancer center.  Wear comfortable clothing and clothing appropriate for easy access to any Portacath or PICC line.   We strive to give you quality time with your provider. You may need to reschedule your appointment if you arrive late (15 or more minutes).  Arriving late affects you and other patients whose appointments are after yours.  Also, if you miss three or more appointments without notifying the office, you may be dismissed from the clinic at the provider's discretion.      For prescription refill requests, have your pharmacy contact our office and allow 72 hours for refills to be completed.    Today you received the following chemotherapy and/or immunotherapy agents Taxol , Carboplatin , house fluids and magnesium . Return as scheduled.   To help prevent nausea and vomiting after your treatment, we encourage you to take your nausea medication as directed.  BELOW ARE SYMPTOMS THAT SHOULD BE REPORTED IMMEDIATELY: *FEVER GREATER THAN 100.4 F (38 C) OR HIGHER *CHILLS OR SWEATING *NAUSEA AND VOMITING THAT IS NOT CONTROLLED WITH YOUR NAUSEA MEDICATION *UNUSUAL SHORTNESS OF BREATH *UNUSUAL BRUISING OR BLEEDING *URINARY PROBLEMS (pain or burning when urinating, or frequent urination) *BOWEL PROBLEMS (unusual diarrhea, constipation, pain near the anus) TENDERNESS IN MOUTH AND THROAT WITH OR WITHOUT PRESENCE OF ULCERS (sore throat, sores in mouth, or a toothache) UNUSUAL RASH, SWELLING OR PAIN  UNUSUAL VAGINAL DISCHARGE OR ITCHING   Items with *  indicate a potential emergency and should be followed up as soon as possible or go to the Emergency Department if any problems should occur.  Please show the CHEMOTHERAPY ALERT CARD or IMMUNOTHERAPY ALERT CARD at check-in to the Emergency Department and triage nurse.  Should you have questions after your visit or need to cancel or reschedule your appointment, please contact [facility] [place] - A DEPT OF [facility] [phone]  and follow the prompts.  Office hours are 8:00 a.m. to 4:30 p.m. Monday - Friday. Please note that voicemails left after 4:00 p.m. may not be returned until the following business day.  We are closed weekends and major holidays. You have access to a nurse at all times for urgent questions. Please call the main number to the clinic [phone] and follow the prompts.  For any non-urgent questions, you may also contact your provider using MyChart. We now offer e-Visits for anyone 28 and older to request care online for non-urgent symptoms. For details visit [email or web address].   Also download the MyChart app! Go to the app store, search "MyChart", open the app, select [place], and log in with your MyChart username and password.

## 2024-01-28 ENCOUNTER — Ambulatory Visit

## 2024-01-28 ENCOUNTER — Other Ambulatory Visit

## 2024-01-28 DIAGNOSIS — Z510 Encounter for antineoplastic radiation therapy: Secondary | ICD-10-CM | POA: Diagnosis not present

## 2024-01-28 DIAGNOSIS — E785 Hyperlipidemia, unspecified: Secondary | ICD-10-CM | POA: Diagnosis not present

## 2024-01-28 DIAGNOSIS — C3411 Malignant neoplasm of upper lobe, right bronchus or lung: Secondary | ICD-10-CM | POA: Diagnosis not present

## 2024-01-28 DIAGNOSIS — Z8673 Personal history of transient ischemic attack (TIA), and cerebral infarction without residual deficits: Secondary | ICD-10-CM | POA: Diagnosis not present

## 2024-01-28 DIAGNOSIS — C771 Secondary and unspecified malignant neoplasm of intrathoracic lymph nodes: Secondary | ICD-10-CM | POA: Diagnosis not present

## 2024-01-28 DIAGNOSIS — I10 Essential (primary) hypertension: Secondary | ICD-10-CM | POA: Diagnosis not present

## 2024-01-29 DIAGNOSIS — I10 Essential (primary) hypertension: Secondary | ICD-10-CM | POA: Diagnosis not present

## 2024-01-29 DIAGNOSIS — C3411 Malignant neoplasm of upper lobe, right bronchus or lung: Secondary | ICD-10-CM | POA: Diagnosis not present

## 2024-01-29 DIAGNOSIS — Z8673 Personal history of transient ischemic attack (TIA), and cerebral infarction without residual deficits: Secondary | ICD-10-CM | POA: Diagnosis not present

## 2024-01-29 DIAGNOSIS — C771 Secondary and unspecified malignant neoplasm of intrathoracic lymph nodes: Secondary | ICD-10-CM | POA: Diagnosis not present

## 2024-01-29 DIAGNOSIS — E785 Hyperlipidemia, unspecified: Secondary | ICD-10-CM | POA: Diagnosis not present

## 2024-01-29 DIAGNOSIS — Z510 Encounter for antineoplastic radiation therapy: Secondary | ICD-10-CM | POA: Diagnosis not present

## 2024-01-30 DIAGNOSIS — E785 Hyperlipidemia, unspecified: Secondary | ICD-10-CM | POA: Diagnosis not present

## 2024-01-30 DIAGNOSIS — Z510 Encounter for antineoplastic radiation therapy: Secondary | ICD-10-CM | POA: Diagnosis not present

## 2024-01-30 DIAGNOSIS — C3411 Malignant neoplasm of upper lobe, right bronchus or lung: Secondary | ICD-10-CM | POA: Diagnosis not present

## 2024-01-30 DIAGNOSIS — Z8673 Personal history of transient ischemic attack (TIA), and cerebral infarction without residual deficits: Secondary | ICD-10-CM | POA: Diagnosis not present

## 2024-01-30 DIAGNOSIS — C771 Secondary and unspecified malignant neoplasm of intrathoracic lymph nodes: Secondary | ICD-10-CM | POA: Diagnosis not present

## 2024-01-30 DIAGNOSIS — I10 Essential (primary) hypertension: Secondary | ICD-10-CM | POA: Diagnosis not present

## 2024-02-02 DIAGNOSIS — I10 Essential (primary) hypertension: Secondary | ICD-10-CM | POA: Diagnosis not present

## 2024-02-02 DIAGNOSIS — Z8673 Personal history of transient ischemic attack (TIA), and cerebral infarction without residual deficits: Secondary | ICD-10-CM | POA: Diagnosis not present

## 2024-02-02 DIAGNOSIS — E785 Hyperlipidemia, unspecified: Secondary | ICD-10-CM | POA: Diagnosis not present

## 2024-02-02 DIAGNOSIS — C771 Secondary and unspecified malignant neoplasm of intrathoracic lymph nodes: Secondary | ICD-10-CM | POA: Diagnosis not present

## 2024-02-02 DIAGNOSIS — C3411 Malignant neoplasm of upper lobe, right bronchus or lung: Secondary | ICD-10-CM | POA: Diagnosis not present

## 2024-02-02 DIAGNOSIS — Z510 Encounter for antineoplastic radiation therapy: Secondary | ICD-10-CM | POA: Diagnosis not present

## 2024-02-03 ENCOUNTER — Inpatient Hospital Stay

## 2024-02-03 DIAGNOSIS — Z510 Encounter for antineoplastic radiation therapy: Secondary | ICD-10-CM | POA: Diagnosis not present

## 2024-02-03 DIAGNOSIS — C771 Secondary and unspecified malignant neoplasm of intrathoracic lymph nodes: Secondary | ICD-10-CM | POA: Diagnosis not present

## 2024-02-03 DIAGNOSIS — C3411 Malignant neoplasm of upper lobe, right bronchus or lung: Secondary | ICD-10-CM | POA: Diagnosis not present

## 2024-02-03 DIAGNOSIS — Z8673 Personal history of transient ischemic attack (TIA), and cerebral infarction without residual deficits: Secondary | ICD-10-CM | POA: Diagnosis not present

## 2024-02-03 DIAGNOSIS — E785 Hyperlipidemia, unspecified: Secondary | ICD-10-CM | POA: Diagnosis not present

## 2024-02-03 DIAGNOSIS — I10 Essential (primary) hypertension: Secondary | ICD-10-CM | POA: Diagnosis not present

## 2024-02-04 ENCOUNTER — Other Ambulatory Visit

## 2024-02-04 ENCOUNTER — Ambulatory Visit

## 2024-02-05 ENCOUNTER — Inpatient Hospital Stay: Attending: Internal Medicine

## 2024-02-05 ENCOUNTER — Ambulatory Visit: Admitting: Internal Medicine

## 2024-02-05 ENCOUNTER — Inpatient Hospital Stay

## 2024-02-05 VITALS — BP 135/94 | HR 81 | Temp 98.3°F | Resp 20

## 2024-02-05 DIAGNOSIS — Z79899 Other long term (current) drug therapy: Secondary | ICD-10-CM | POA: Insufficient documentation

## 2024-02-05 DIAGNOSIS — I4891 Unspecified atrial fibrillation: Secondary | ICD-10-CM | POA: Insufficient documentation

## 2024-02-05 DIAGNOSIS — Z87442 Personal history of urinary calculi: Secondary | ICD-10-CM | POA: Insufficient documentation

## 2024-02-05 DIAGNOSIS — G4730 Sleep apnea, unspecified: Secondary | ICD-10-CM | POA: Diagnosis not present

## 2024-02-05 DIAGNOSIS — Z87891 Personal history of nicotine dependence: Secondary | ICD-10-CM | POA: Diagnosis not present

## 2024-02-05 DIAGNOSIS — J432 Centrilobular emphysema: Secondary | ICD-10-CM | POA: Diagnosis not present

## 2024-02-05 DIAGNOSIS — I10 Essential (primary) hypertension: Secondary | ICD-10-CM | POA: Insufficient documentation

## 2024-02-05 DIAGNOSIS — Z801 Family history of malignant neoplasm of trachea, bronchus and lung: Secondary | ICD-10-CM | POA: Diagnosis not present

## 2024-02-05 DIAGNOSIS — G629 Polyneuropathy, unspecified: Secondary | ICD-10-CM | POA: Diagnosis not present

## 2024-02-05 DIAGNOSIS — Z5111 Encounter for antineoplastic chemotherapy: Secondary | ICD-10-CM | POA: Diagnosis not present

## 2024-02-05 DIAGNOSIS — Z9221 Personal history of antineoplastic chemotherapy: Secondary | ICD-10-CM | POA: Insufficient documentation

## 2024-02-05 DIAGNOSIS — C3411 Malignant neoplasm of upper lobe, right bronchus or lung: Secondary | ICD-10-CM | POA: Diagnosis not present

## 2024-02-05 DIAGNOSIS — C770 Secondary and unspecified malignant neoplasm of lymph nodes of head, face and neck: Secondary | ICD-10-CM | POA: Diagnosis not present

## 2024-02-05 DIAGNOSIS — Z8041 Family history of malignant neoplasm of ovary: Secondary | ICD-10-CM | POA: Insufficient documentation

## 2024-02-05 DIAGNOSIS — I700 Atherosclerosis of aorta: Secondary | ICD-10-CM | POA: Insufficient documentation

## 2024-02-05 DIAGNOSIS — Z923 Personal history of irradiation: Secondary | ICD-10-CM | POA: Diagnosis not present

## 2024-02-05 DIAGNOSIS — E785 Hyperlipidemia, unspecified: Secondary | ICD-10-CM | POA: Diagnosis not present

## 2024-02-05 DIAGNOSIS — Z95828 Presence of other vascular implants and grafts: Secondary | ICD-10-CM

## 2024-02-05 DIAGNOSIS — R490 Dysphonia: Secondary | ICD-10-CM | POA: Insufficient documentation

## 2024-02-05 DIAGNOSIS — Z9981 Dependence on supplemental oxygen: Secondary | ICD-10-CM | POA: Insufficient documentation

## 2024-02-05 DIAGNOSIS — G9389 Other specified disorders of brain: Secondary | ICD-10-CM | POA: Insufficient documentation

## 2024-02-05 DIAGNOSIS — Z8673 Personal history of transient ischemic attack (TIA), and cerebral infarction without residual deficits: Secondary | ICD-10-CM | POA: Diagnosis not present

## 2024-02-05 DIAGNOSIS — Z7901 Long term (current) use of anticoagulants: Secondary | ICD-10-CM | POA: Diagnosis not present

## 2024-02-05 DIAGNOSIS — I739 Peripheral vascular disease, unspecified: Secondary | ICD-10-CM | POA: Diagnosis not present

## 2024-02-05 DIAGNOSIS — C3490 Malignant neoplasm of unspecified part of unspecified bronchus or lung: Secondary | ICD-10-CM

## 2024-02-05 LAB — CBC WITH DIFFERENTIAL/PLATELET
Abs Immature Granulocytes: 0.01 10*3/uL (ref 0.00–0.07)
Basophils Absolute: 0 10*3/uL (ref 0.0–0.1)
Basophils Relative: 0 %
Eosinophils Absolute: 0 10*3/uL (ref 0.0–0.5)
Eosinophils Relative: 0 %
HCT: 32 % — ABNORMAL LOW (ref 39.0–52.0)
Hemoglobin: 10.4 g/dL — ABNORMAL LOW (ref 13.0–17.0)
Immature Granulocytes: 1 %
Lymphocytes Relative: 13 %
Lymphs Abs: 0.3 10*3/uL — ABNORMAL LOW (ref 0.7–4.0)
MCH: 27.8 pg (ref 26.0–34.0)
MCHC: 32.5 g/dL (ref 30.0–36.0)
MCV: 85.6 fL (ref 80.0–100.0)
Monocytes Absolute: 0.3 10*3/uL (ref 0.1–1.0)
Monocytes Relative: 13 %
Neutro Abs: 1.6 10*3/uL — ABNORMAL LOW (ref 1.7–7.7)
Neutrophils Relative %: 73 %
Platelets: 127 10*3/uL — ABNORMAL LOW (ref 150–400)
RBC: 3.74 MIL/uL — ABNORMAL LOW (ref 4.22–5.81)
RDW: 18.2 % — ABNORMAL HIGH (ref 11.5–15.5)
WBC: 2.2 10*3/uL — ABNORMAL LOW (ref 4.0–10.5)
nRBC: 0 % (ref 0.0–0.2)

## 2024-02-05 LAB — COMPREHENSIVE METABOLIC PANEL WITH GFR
ALT: 21 U/L (ref 0–44)
AST: 19 U/L (ref 15–41)
Albumin: 3.4 g/dL — ABNORMAL LOW (ref 3.5–5.0)
Alkaline Phosphatase: 33 U/L — ABNORMAL LOW (ref 38–126)
Anion gap: 7 (ref 5–15)
BUN: 19 mg/dL (ref 8–23)
CO2: 27 mmol/L (ref 22–32)
Calcium: 8.9 mg/dL (ref 8.9–10.3)
Chloride: 103 mmol/L (ref 98–111)
Creatinine, Ser: 1.08 mg/dL (ref 0.61–1.24)
GFR, Estimated: >60 mL/min (ref >60)
Glucose, Bld: 146 mg/dL — ABNORMAL HIGH (ref 70–99)
Potassium: 3.7 mmol/L (ref 3.5–5.1)
Sodium: 137 mmol/L (ref 135–145)
Total Bilirubin: 0.8 mg/dL (ref 0.0–1.2)
Total Protein: 6.1 g/dL — ABNORMAL LOW (ref 6.5–8.1)

## 2024-02-05 LAB — MAGNESIUM: Magnesium: 1.7 mg/dL (ref 1.7–2.4)

## 2024-02-05 MED ORDER — SODIUM CHLORIDE 0.9% FLUSH
10.0000 mL | Freq: Once | INTRAVENOUS | Status: AC
  Administered 2024-02-05: 10 mL via INTRAVENOUS

## 2024-02-05 MED ORDER — HEPARIN SOD (PORK) LOCK FLUSH 100 UNIT/ML IV SOLN
500.0000 [IU] | Freq: Once | INTRAVENOUS | Status: AC
  Administered 2024-02-05: 500 [IU] via INTRAVENOUS

## 2024-02-05 MED ORDER — SODIUM CHLORIDE 0.9 % IV SOLN: INTRAVENOUS | Status: DC

## 2024-02-05 MED ORDER — SODIUM CHLORIDE FLUSH 0.9 % IV SOLN
10.0000 mL | Freq: Once | INTRAVENOUS | Status: AC
  Administered 2024-02-05: 10 mL via INTRAVENOUS
  Filled 2024-02-05: qty 10

## 2024-02-05 MED ORDER — SODIUM CHLORIDE 0.9 % IV SOLN
INTRAVENOUS | Status: DC
Start: 2024-02-05 — End: 2024-02-05

## 2024-02-05 NOTE — Patient Instructions (Signed)
 [facility] [place] - A DEPT OF [facility]  Discharge Instructions: Thank you for choosing [facility] to provide your oncology and hematology care.  If you have a lab appointment with the Cancer Center - please note that after April 8th, 2024, all labs will be drawn in the cancer center.  You do not have to check in or register with the main entrance as you have in the past but will complete your check-in in the cancer center.  Wear comfortable clothing and clothing appropriate for easy access to any Portacath or PICC line.   We strive to give you quality time with your provider. You may need to reschedule your appointment if you arrive late (15 or more minutes).  Arriving late affects you and other patients whose appointments are after yours.  Also, if you miss three or more appointments without notifying the office, you may be dismissed from the clinic at the provider's discretion.      For prescription refill requests, have your pharmacy contact our office and allow 72 hours for refills to be completed.    Today you received the following hydration therapy, return as scheduled.   To help prevent nausea and vomiting after your treatment, we encourage you to take your nausea medication as directed.  BELOW ARE SYMPTOMS THAT SHOULD BE REPORTED IMMEDIATELY: *FEVER GREATER THAN 100.4 F (38 C) OR HIGHER *CHILLS OR SWEATING *NAUSEA AND VOMITING THAT IS NOT CONTROLLED WITH YOUR NAUSEA MEDICATION *UNUSUAL SHORTNESS OF BREATH *UNUSUAL BRUISING OR BLEEDING *URINARY PROBLEMS (pain or burning when urinating, or frequent urination) *BOWEL PROBLEMS (unusual diarrhea, constipation, pain near the anus) TENDERNESS IN MOUTH AND THROAT WITH OR WITHOUT PRESENCE OF ULCERS (sore throat, sores in mouth, or a toothache) UNUSUAL RASH, SWELLING OR PAIN  UNUSUAL VAGINAL DISCHARGE OR ITCHING   Items with * indicate a potential emergency and should be followed up as soon as  possible or go to the Emergency Department if any problems should occur.  Please show the CHEMOTHERAPY ALERT CARD or IMMUNOTHERAPY ALERT CARD at check-in to the Emergency Department and triage nurse.  Should you have questions after your visit or need to cancel or reschedule your appointment, please contact [facility] [place] - A DEPT OF [facility] [phone]  and follow the prompts.  Office hours are 8:00 a.m. to 4:30 p.m. Monday - Friday. Please note that voicemails left after 4:00 p.m. may not be returned until the following business day.  We are closed weekends and major holidays. You have access to a nurse at all times for urgent questions. Please call the main number to the clinic [phone] and follow the prompts.  For any non-urgent questions, you may also contact your provider using MyChart. We now offer e-Visits for anyone 69 and older to request care online for non-urgent symptoms. For details visit [email or web address].   Also download the MyChart app! Go to the app store, search "MyChart", open the app, select [place], and log in with your MyChart username and password.

## 2024-02-05 NOTE — Progress Notes (Signed)
Patient tolerated hydration therapy with no complaints voiced. Side effects with management reviewed with understanding verbalized. Port site clean and dry with no bruising or swelling noted at site. Good blood return noted before and after administration of therapy. Band aid applied. Patient left in satisfactory condition with VSS and no s/s of distress noted.

## 2024-02-10 ENCOUNTER — Encounter

## 2024-02-11 ENCOUNTER — Other Ambulatory Visit: Payer: Self-pay

## 2024-02-11 ENCOUNTER — Other Ambulatory Visit: Payer: Self-pay | Admitting: Internal Medicine

## 2024-02-11 ENCOUNTER — Encounter: Payer: Self-pay | Admitting: Internal Medicine

## 2024-02-11 DIAGNOSIS — I480 Paroxysmal atrial fibrillation: Secondary | ICD-10-CM

## 2024-02-11 DIAGNOSIS — Z8673 Personal history of transient ischemic attack (TIA), and cerebral infarction without residual deficits: Secondary | ICD-10-CM

## 2024-02-11 MED ORDER — WARFARIN SODIUM 5 MG PO TABS: 2.5000 mg | ORAL_TABLET | ORAL | 1 refills | Status: DC

## 2024-02-16 ENCOUNTER — Ambulatory Visit (HOSPITAL_COMMUNITY)
Admission: RE | Admit: 2024-02-16 | Discharge: 2024-02-16 | Disposition: A | Discharge status: Home / Self Care | Source: Ambulatory Visit | Attending: Hematology | Admitting: Hematology

## 2024-02-16 DIAGNOSIS — I700 Atherosclerosis of aorta: Secondary | ICD-10-CM | POA: Diagnosis not present

## 2024-02-16 DIAGNOSIS — J432 Centrilobular emphysema: Secondary | ICD-10-CM | POA: Diagnosis not present

## 2024-02-16 DIAGNOSIS — C3491 Malignant neoplasm of unspecified part of right bronchus or lung: Secondary | ICD-10-CM | POA: Diagnosis not present

## 2024-02-16 DIAGNOSIS — C3490 Malignant neoplasm of unspecified part of unspecified bronchus or lung: Secondary | ICD-10-CM | POA: Diagnosis not present

## 2024-02-16 MED ORDER — IOHEXOL 300 MG/ML  SOLN
75.0000 mL | Freq: Once | INTRAMUSCULAR | Status: AC | PRN
  Administered 2024-02-16: 75 mL via INTRAVENOUS

## 2024-02-17 ENCOUNTER — Ambulatory Visit: Attending: Internal Medicine | Admitting: *Deleted

## 2024-02-17 DIAGNOSIS — I480 Paroxysmal atrial fibrillation: Secondary | ICD-10-CM | POA: Diagnosis not present

## 2024-02-17 DIAGNOSIS — Z5181 Encounter for therapeutic drug level monitoring: Secondary | ICD-10-CM | POA: Insufficient documentation

## 2024-02-17 LAB — POCT INR: INR: 3.6 — AB (ref 2.0–3.0)

## 2024-02-17 NOTE — Patient Instructions (Signed)
 Hold warfarin tonight then decrease dose to 1 tablet daily except 1/2 tablet on Tuesdays, Thursdays and Saturdays Continue greens/salads  Recheck INR in 3 weeks.  Finished chemo and radiation

## 2024-02-23 ENCOUNTER — Encounter: Payer: Self-pay | Admitting: Internal Medicine

## 2024-02-24 ENCOUNTER — Other Ambulatory Visit: Payer: Self-pay

## 2024-02-24 DIAGNOSIS — J449 Chronic obstructive pulmonary disease, unspecified: Secondary | ICD-10-CM

## 2024-02-24 MED ORDER — IPRATROPIUM-ALBUTEROL 0.5-2.5 (3) MG/3ML IN SOLN
RESPIRATORY_TRACT | 0 refills | Status: DC
Start: 2024-02-24 — End: 2024-04-15

## 2024-02-24 NOTE — Progress Notes (Signed)
 [facility] [address] [place] [address]   Clinic Day:  02/25/2024  Referring physician: Meldon Sport, MD  Patient Care Team: Randall Sport, MD as PCP - General (Internal Medicine) Randall Boros, MD as Medical Oncologist (Medical Oncology) Randall Knuckles, RN as Oncology Nurse Navigator (Medical Oncology)   ASSESSMENT & PLAN:   Assessment:  1.  Recurrent adenocarcinoma of the lung: - Stage Ia (T1b N0 M0) adenocarcinoma of the left upper lobe, s/p left VATS, left upper lobectomy with mediastinal lymph node dissection by Dr. Luna Salinas on 08/01/2016 - Stage Ia moderately differentiated adenocarcinoma, 0.7 cm of the right upper lobe, s/p right VATS with wedge resection of right upper lobe nodule and bleb resection by Dr. Luna Salinas on 12/09/2018 - PET scan (08/19/2023): Dysregulation artifact degrades image quality.  Recurrent bronchogenic carcinoma in the medial right upper lobe along the resection margin with no evidence of distant metastatic disease.  Faint 6 mm nodule in the superior segment right lower lobe too small for PET resolution.  Focal hypermetabolism in the central prostate. - CT super D chest (10/21/2023): Solid 2.6 cm central right upper lobe pulmonary nodule along the wedge resection suture line.  Solid 1.3 cm and 0.7 cm peripheral right lower lobe lung nodules. - MRI brain (12/02/2023): Without IV contrast with no strong evidence of cerebral metastatic disease. - Right paratracheal lymph node FNA (10/24/2023): Adenocarcinoma - Foundation 1 CDX: Insufficient tissue - PD-L1 22 C3 (TPS): 5% - Foundation 1 blood NGS: TMB-low, MSI-high not detected, no other targetable mutations - Chemoradiation therapy with weekly carboplatin  and paclitaxel  started on 12/23/2023, completed on 02/03/2024 - Consolidation durvalumab started on 02/25/2024  2.  Social/family history: - He lives at home with his wife Randall Sullivan.  He had a stroke in 2022 which has caused  short-term memory loss, loss of right-sided peripheral vision and balance problems.  He currently walks with a cane and sometimes walker.  He is also on continuous oxygen  since hospitalization in December 2024.  Most of the day he spends time sitting in the chair.  He can wash dishes but does require some help with some of the ADLs.  Quit smoking in 2017. - Mother and sister had ovarian cancer.  Half brother had lung cancer.   Plan:  1.  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 - Overall his energy levels are improving. - Reviewed CT chest from 02/16/2024: Partial response with no progression. - We talked about consolidation immunotherapy to improve PFS and OS.  We discussed Durvalumab to be given every 2 weeks and if he tolerates well, may switch to every 4 weeks for a total of 1 year. - We discussed side effects including fatigue, dry skin, thyroid  abnormalities, 5 to 10% incidence of colitis, pneumonitis, hepatitis, and rare chance of hypophysitis.  He does not have any history of autoimmune disorders. - He will start consolidation durvalumab today.  Will reevaluate him in 2 weeks.  2.  Hoarseness: - Hoarseness since December which gets worse in the afternoons.  He will see ENT.  3.  Peripheral neuropathy: - He has baseline numbness in the fingertips of both hands, right more than left from the stroke.  No change since chemotherapy started.  4.  Hypertension/A-fib: - Continue to hold Cardizem .  Continue Toprol -XL daily.  Heart rate is 62 and blood pressure is 110/76.  5.  Hypomagnesemia: - Continue magnesium  oxide twice daily.  Magnesium   is normal today.   Orders Placed This Encounter  Procedures   Magnesium     Standing Status:   Future    Expected Date:   02/26/2024    Expiration Date:   02/25/2025   CBC with Differential    Standing Status:   Future    Expected Date:   02/26/2024    Expiration Date:   02/25/2025    Comprehensive metabolic panel    Standing Status:   Future    Expected Date:   02/26/2024    Expiration Date:   02/25/2025   T4    Standing Status:   Future    Number of Occurrences:   1    Expected Date:   02/26/2024    Expiration Date:   02/25/2025   TSH    Standing Status:   Future    Expected Date:   02/26/2024    Expiration Date:   02/25/2025   Magnesium     Standing Status:   Future    Expected Date:   03/11/2024    Expiration Date:   03/11/2025   CBC with Differential    Standing Status:   Future    Expected Date:   03/11/2024    Expiration Date:   03/11/2025   Comprehensive metabolic panel    Standing Status:   Future    Expected Date:   03/11/2024    Expiration Date:   03/11/2025   Magnesium     Standing Status:   Future    Expected Date:   03/25/2024    Expiration Date:   03/25/2025   CBC with Differential    Standing Status:   Future    Expected Date:   03/25/2024    Expiration Date:   03/25/2025   Comprehensive metabolic panel    Standing Status:   Future    Expected Date:   03/25/2024    Expiration Date:   03/25/2025   T4    Standing Status:   Future    Expected Date:   03/25/2024    Expiration Date:   03/25/2025   TSH    Standing Status:   Future    Expected Date:   03/25/2024    Expiration Date:   03/25/2025   Magnesium     Standing Status:   Future    Expected Date:   04/08/2024    Expiration Date:   04/08/2025   CBC with Differential    Standing Status:   Future    Expected Date:   04/08/2024    Expiration Date:   04/08/2025   Comprehensive metabolic panel    Standing Status:   Future    Expected Date:   04/08/2024    Expiration Date:   04/08/2025      Randall Sullivan,acting as a scribe for Randall Boros, MD.,have documented all relevant documentation on the behalf of Randall Boros, MD,as directed by  Randall Boros, MD while in the presence of Randall Boros, MD.  I, Randall Boros MD, have reviewed the above documentation for accuracy  and completeness, and I agree with the above.     Randall Boros, MD   5/28/20252:46 PM  CHIEF COMPLAINT/PURPOSE OF CONSULT:   Diagnosis: Recurrent stage III adenocarcinoma   Cancer Staging  Primary lung adenocarcinoma (HCC) Staging form: Lung, AJCC 8th Edition - Clinical: Stage IA2 (cT1b, cN0, cM0) - Signed by Randall Simas, MD on 12/04/2020  Recurrent non-small cell lung cancer (NSCLC) (HCC) Staging form: Lung, AJCC V9 - Clinical stage from  12/03/2023: Stage IA3 (rcT1c, cN0, cM0) - Unsigned    Prior Therapy: Left upper lobectomy (2017) and right upper lobectomy (2020)  Current Therapy: Concurrent chemoradiation therapy   HISTORY OF PRESENT ILLNESS:   Oncology History  Primary lung adenocarcinoma (HCC)  10/15/2016 Initial Diagnosis   Primary lung adenocarcinoma (HCC)   12/04/2020 Cancer Staging   Staging form: Lung, AJCC 8th Edition - Clinical: Stage IA2 (cT1b, cN0, cM0) - Signed by Randall Simas, MD on 12/04/2020   Recurrent non-small cell lung cancer (NSCLC) (HCC)  12/03/2023 Initial Diagnosis   Recurrent non-small cell lung cancer (NSCLC) (HCC)   12/23/2023 - 01/27/2024 Chemotherapy   Patient is on Treatment Plan : LUNG Carboplatin  + Paclitaxel  + XRT q7d     02/25/2024 -  Chemotherapy   Patient is on Treatment Plan : LUNG Durvalumab (10) q14d         Randall Sullivan is a 73 y.o. male presenting to clinic today for evaluation of recurrent adenocarcinoma of right lung, non small cell type at the request of Randall Sullivan.  Randall Sullivan was first diagnosed in 2017 with Stage IA (T1b, N0, M0) non-small cell lung cancer, well-differentiated adenocarcinoma presented with left upper lobe pulmonary nodule. He then underwent left VATS and left upper lobectomy with mediastinal lymph node dissection under Dr. Luna Salinas on 08/01/2016. Randall Sullivan has since been under surveillance for left lung adenocarcinoma.  He was diagnosed with stage Ia non-small cell lung cancer in March 2020 and underwent right  VATS with wedge resection of right upper lobe nodule and bleb resection under Dr. Luna Salinas on 12/10/2018. He was under observation until a CT scan on 07/08/23 found an increasing mass like soft tissue along the suture margin in the central right upper lobe. PET on 08/19/23 found: recurrent bronchogenic carcinoma in the medial right upper lobe along the resection margin with faint 0.6 cm nodule in the superior segment of the right lower lobe too small for PET resolution.   Randall Sullivan then had a bronchoscopy on 10/24/23 with right paratracheal lymph node biopsy and endobronchial valve replacement in the anterior apical, posterior segments of right upper lobe, and superior segment of right lower lobe. Pathology of lymph node biopsy showed: adenocarcinoma. Molecular pathology showed a PDL1 score of 5%.   His cancer is now considered Stage III NSCLC adenocarcinoma due to right paratracheal lymph node involvement. Randall Sullivan had discussion of treatment with Randall Sullivan on 11/26/23 that consisted of weekly mild chemotherapy with adjunct radiation at [facility] with their side effects.   MRI brain done on 12/02/23 showed: No strong evidence of cerebral metastatic disease on this noncontrast exam. 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 Encephalomalacia and mild hemosiderin. No acute intracranial abnormality identified.  Randall Sullivan has a history of stroke, with symptoms of fatigue, weakness, vision changes, and hoarseness. He also has pertinent history of COPD and requires at home oxygen .  Today, he states that he is doing well overall. His appetite level is at 100%. His energy level is at 50%.  INTERVAL HISTORY:   Randall Sullivan is a 73 y.o. male presenting to the clinic today for follow-up of recurrent stage III NSCLC. He was last seen by me on 01/27/24.  Since his last visit, he underwent CT chest on 02/16/24.   Today, he states that he is doing well overall. His appetite level is at 100%. His energy level is  at 50%.   PAST MEDICAL HISTORY:   Past Medical History: Past Medical History:  Diagnosis Date  A-fib (HCC) 2017   after last lung surgery patient had a history if Afib documented in hospital    AAA (abdominal aortic aneurysm) (HCC)    4.0 cm 09/17/22 US    Acute appendicitis 07/14/2021   Arthritis    back & legs ( knees)   Cancer (HCC)    lung   Complication of anesthesia    agitated upon waking from anesth.    Dyspnea    Dysrhythmia    A. Fib   Emphysema    Emphysema of lung (HCC)    History of kidney stones    Hyperlipidemia    Hypertension    Oxygen  dependent    2L continuous   Peripheral vascular disease (HCC)    Aortic Aneurysm   Pneumonia    Pre-diabetes    Sleep apnea    No OSA, but wears 2L [place] O2 continuous including at night   Stroke [facility]) 2022   Right sided weakness, decreased hearing, balance and memory loss   Tobacco abuse     Surgical History: Past Surgical History:  Procedure Laterality Date   HERNIA REPAIR Bilateral 1999   umbilical   IR IMAGING GUIDED PORT INSERTION  12/09/2023   LAPAROSCOPIC APPENDECTOMY N/A 07/15/2021   Procedure: APPENDECTOMY LAPAROSCOPIC;  Surgeon: Awilda Bogus, MD;  Location: [facility];  Service: General;  Laterality: N/A;   VIDEO ASSISTED THORACOSCOPY Right 12/21/2018   Procedure: VIDEO ASSISTED THORACOSCOPY AND RESECTION OF BLEBS RIGHT LOWER LOBE;  Surgeon: Zelphia Higashi, MD;  Location: [facility] OR;  Service: Thoracic;  Laterality: Right;   VIDEO ASSISTED THORACOSCOPY (VATS)/ LOBECTOMY Left 08/01/2016   Procedure: VIDEO ASSISTED THORACOSCOPY (VATS)/ LEFT UPPER LOBECTOMY with lymph node sampling and onQ placement;  Surgeon: Zelphia Higashi, MD;  Location: [facility] OR;  Service: Thoracic;  Laterality: Left;   VIDEO ASSISTED THORACOSCOPY (VATS)/WEDGE RESECTION Right 12/09/2018   Procedure: VIDEO ASSISTED THORACOSCOPY (VATS)/WEDGE RESECTION;  Surgeon: Zelphia Higashi, MD;  Location: [facility];  Service: Thoracic;   Laterality: Right;   VIDEO BRONCHOSCOPY Bilateral 12/21/2018   Procedure: VIDEO BRONCHOSCOPY;  Surgeon: Zelphia Higashi, MD;  Location: [facility];  Service: Thoracic;  Laterality: Bilateral;   VIDEO BRONCHOSCOPY WITH ENDOBRONCHIAL NAVIGATION N/A 07/03/2016   Procedure: VIDEO BRONCHOSCOPY WITH ENDOBRONCHIAL NAVIGATION;  Surgeon: Zelphia Higashi, MD;  Location: [facility];  Service: Thoracic;  Laterality: N/A;   VIDEO BRONCHOSCOPY WITH ENDOBRONCHIAL NAVIGATION N/A 10/24/2023   Procedure: VIDEO BRONCHOSCOPY WITH ENDOBRONCHIAL NAVIGATION;  Surgeon: Zelphia Higashi, MD;  Location: [facility];  Service: Thoracic;  Laterality: N/A;   VIDEO BRONCHOSCOPY WITH ENDOBRONCHIAL ULTRASOUND N/A 10/24/2023   Procedure: VIDEO BRONCHOSCOPY WITH ENDOBRONCHIAL ULTRASOUND;  Surgeon: Zelphia Higashi, MD;  Location: [facility];  Service: Thoracic;  Laterality: N/A;   VIDEO BRONCHOSCOPY WITH INSERTION OF INTERBRONCHIAL VALVE (IBV) N/A 12/11/2018   Procedure: VIDEO BRONCHOSCOPY WITH INSERTION OF INTERBRONCHIAL VALVE (IBV);  Surgeon: Zelphia Higashi, MD;  Location: [facility] OR;  Service: Thoracic;  Laterality: N/A;   VIDEO BRONCHOSCOPY WITH INSERTION OF INTERBRONCHIAL VALVE (IBV) N/A 02/10/2019   Procedure: VIDEO BRONCHOSCOPY WITH REMOVAL OF INTERBRONCHIAL VALVE (IBV);  Surgeon: Zelphia Higashi, MD;  Location: [facility] OR;  Service: Thoracic;  Laterality: N/A;    Social History: Social History   Socioeconomic History   Marital status: Married    Spouse name: Not on file   Number of children: 2   Years of education: Not on file   Highest education level: 9th grade  Occupational History   Occupation:  Vinyl siding   Occupation: Retired  Tobacco Use   Smoking status: Former    Current packs/day: 0.00    Average packs/day: 1 pack/day for 40.0 years (40.0 ttl pk-yrs)    Types: Cigarettes    Start date: 05/28/1976    Quit date: 05/28/2016    Years since quitting: 7.7   Smokeless tobacco: Never  Vaping Use    Vaping status: Never Used  Substance and Sexual Activity   Alcohol use: Not Currently    Comment: quit- 2016   Drug use: No   Sexual activity: Not Currently  Other Topics Concern   Not on file  Social History Narrative   Married for 20 years,second.Lives with wife. 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   Social Drivers of Corporate investment banker Strain: High Risk (12/10/2023)   Overall Financial Resource Strain (CARDIA)    Difficulty of Paying Living Expenses: Very hard  Food Insecurity: No Food Insecurity (09/17/2023)   Hunger Vital Sign    Worried About Running Out of Food in the Last Year: Never true    Ran Out of Food in the Last Year: Never true  Transportation Needs: No Transportation Needs (09/17/2023)   PRAPARE - Administrator, Civil Service (Medical): No    Lack of Transportation (Non-Medical): No  Physical Activity: Insufficiently Active (07/11/2023)   Exercise Vital Sign    Days of Exercise per Week: 3 days    Minutes of Exercise per Session: 10 min  Stress: Patient Declined (07/11/2023)   [employer] of Occupational Health - Occupational Stress Questionnaire    Feeling of Stress : Patient declined  Social Connections: Unknown (07/11/2023)   Social Connection and Isolation Panel [NHANES]    Frequency of Communication with Friends and Family: Twice a week    Frequency of Social Gatherings with Friends and Family: Once a week    Attends Religious Services: Patient declined    Database administrator or Organizations: No    Attends Engineer, structural: Not on file    Marital Status: Married  Intimate Partner Violence: Patient Unable To Answer (09/17/2023)   Humiliation, Afraid, Rape, and Kick questionnaire    Fear of Current or Ex-Partner: Patient unable to answer    Emotionally Abused: Patient unable to answer    Physically Abused: Patient unable to answer    Sexually Abused: Patient unable to answer    Family History: Family  History  Problem Relation Age of Onset   Cancer Mother    Cancer Sister    Aneurysm Sister    Kidney disease Paternal Aunt    Asthma Brother    Cancer - Lung Brother    Cancer Brother    Arthritis Brother     Current Medications:  Current Outpatient Medications:    acetaminophen  (TYLENOL ) 325 MG tablet, Take 2 tablets (650 mg total) by mouth every 6 (six) hours as needed for mild pain (or Fever >/= 101)., Disp: , Rfl:    ALPRAZolam  (XANAX ) 0.5 MG tablet, Take 0.5 mg by mouth daily., Disp: , Rfl:    aluminum -magnesium  hydroxide 200-200 MG/5ML suspension, Take 15 mLs by mouth every 6 (six) hours as needed for indigestion (Mix 1:1 with lidocaine  and swish and spit)., Disp: 355 mL, Rfl: 2   Budeson-Glycopyrrol-Formoterol  (BREZTRI AEROSPHERE) 160-9-4.8 MCG/ACT AERO, Inhale 2 puffs into the lungs in the morning and at bedtime., Disp: , Rfl:    cetirizine  (ZYRTEC ) 10 MG tablet, Take 10 mg by mouth daily., Disp: , Rfl:  Cholecalciferol  (VITAMIN D3) 125 MCG (5000 UT) CAPS, Take 1 capsule (5,000 Units total) by mouth daily., Disp: 30 capsule, Rfl: 0   diltiazem  (CARDIZEM  CD) 180 MG 24 hr capsule, Take 1 capsule (180 mg total) by mouth daily., Disp: 90 capsule, Rfl: 3   fenofibrate  (TRICOR ) 145 MG tablet, Take 1 tablet (145 mg total) by mouth daily., Disp: 90 tablet, Rfl: 1   finasteride  (PROSCAR ) 5 MG tablet, Take 1 tablet (5 mg total) by mouth daily., Disp: 30 tablet, Rfl: 3   guaiFENesin  (MUCINEX ) 600 MG 12 hr tablet, Take by mouth 2 (two) times daily., Disp: , Rfl:    ipratropium-albuterol  (DUONEB) 0.5-2.5 (3) MG/3ML SOLN, USE 1 AMPULE IN NEBULIZER 4 TIMES DAILY AND AS NEEDED FOR SHORTNESS OF BREATH AND FOR WHEEZING, Disp: 450 mL, Rfl: 0   lidocaine  (XYLOCAINE ) 2 % solution, Use as directed 15 mLs in the mouth or throat every 6 (six) hours as needed for mouth pain (Mix 1:1 with Maalox and swish and spit)., Disp: 100 mL, Rfl: 2   magnesium  oxide (MAG-OX) 400 (240 Mg) MG tablet, Take 1 tablet  (400 mg total) by mouth 2 (two) times daily., Disp: 60 tablet, Rfl: 3   meclizine  (ANTIVERT ) 25 MG tablet, Take 1 tablet (25 mg total) by mouth 3 (three) times daily as needed for dizziness., Disp: 30 tablet, Rfl: 0   metoprolol  succinate (TOPROL  XL) 50 MG 24 hr tablet, Take 1 tablet (50 mg total) by mouth daily. Take with or immediately following a meal., Disp: 30 tablet, Rfl: 2   Omega-3 Fatty Acids (FISH OIL ULTRA PO), Take 1,200 mg by mouth in the morning and at bedtime., Disp: , Rfl:    OXYGEN , Inhale 2 L into the lungs continuous. Hx of lung ca, copd, and emphysema, Disp: , Rfl:    Palonosetron  HCl (ALOXI  IV), Inject into the vein., Disp: , Rfl:    pantoprazole  (PROTONIX ) 40 MG tablet, Take 1 tablet (40 mg total) by mouth daily., Disp: 30 tablet, Rfl: 1   polyethylene glycol (MIRALAX  / GLYCOLAX ) 17 g packet, Take 17 g by mouth daily as needed for mild constipation., Disp: , Rfl:    rosuvastatin  (CRESTOR ) 40 MG tablet, Take 1 tablet (40 mg total) by mouth daily., Disp: 90 tablet, Rfl: 3   tamsulosin  (FLOMAX ) 0.4 MG CAPS capsule, Take 2 capsules (0.8 mg total) by mouth daily., Disp: 180 capsule, Rfl: 3   warfarin (COUMADIN ) 5 MG tablet, Take 0.5-1 tablets (2.5-5 mg total) by mouth See admin instructions. Take 2.5 mg by mouth Tuesday and Friday and take 5 mg on Monday, Wednesday, Thursday, Saturday and Sunday, Disp: 90 tablet, Rfl: 1 No current facility-administered medications for this visit.  Facility-Administered Medications Ordered in Other Visits:    0.9 %  sodium chloride  infusion, , Intravenous, Continuous, Randall Boros, MD, Last Rate: 10 mL/hr at 02/25/24 1409, New Bag at 02/25/24 1409   durvalumab (IMFINZI) 1,000 mg in sodium chloride  0.9 % 100 mL chemo infusion, 10 mg/kg (Order-Specific), Intravenous, Once, Randall Boros, MD   heparin  lock flush 100 unit/mL, 500 Units, Intracatheter, Once PRN, Randall Jillson, MD   sodium chloride  flush (NS) 0.9 % injection 10 mL, 10  mL, Intracatheter, PRN, Randall Chojnowski, MD   Allergies: No Known Allergies  REVIEW OF SYSTEMS:   Review of Systems  Constitutional:  Negative for chills, fatigue and fever.  HENT:   Negative for lump/mass, mouth sores, nosebleeds, sore throat and trouble swallowing.   Eyes:  Negative for eye  problems.  Respiratory:  Positive for cough and shortness of breath.   Cardiovascular:  Negative for chest pain, leg swelling and palpitations.  Gastrointestinal:  Negative for abdominal pain, constipation, diarrhea, nausea and vomiting.  Genitourinary:  Negative for bladder incontinence, difficulty urinating, dysuria, frequency, hematuria and nocturia.   Musculoskeletal:  Negative for arthralgias, back pain, flank pain, myalgias and neck pain.  Skin:  Negative for itching and rash.  Neurological:  Positive for headaches. Negative for dizziness and numbness.  Hematological:  Does not bruise/bleed easily.  Psychiatric/Behavioral:  Positive for sleep disturbance. Negative for depression and suicidal ideas. The patient is not nervous/anxious.   All other systems reviewed and are negative.    VITALS:   Height 5\' 10"  (1.778 m), weight 217 lb (98.4 kg).  Wt Readings from Last 3 Encounters:  02/25/24 217 lb (98.4 kg)  01/27/24 214 lb 4.6 oz (97.2 kg)  01/20/24 215 lb 6.4 oz (97.7 kg)    Body mass index is 31.14 kg/m.  Performance status (ECOG): 1 - Symptomatic but completely ambulatory  PHYSICAL EXAM:   Physical Exam Vitals and nursing note reviewed. Exam conducted with a chaperone present.  Constitutional:      Appearance: Normal appearance.  Cardiovascular:     Rate and Rhythm: Normal rate and regular rhythm.     Pulses: Normal pulses.     Heart sounds: Normal heart sounds.  Pulmonary:     Effort: Pulmonary effort is normal.     Breath sounds: Normal breath sounds.  Abdominal:     Palpations: Abdomen is soft. There is no hepatomegaly, splenomegaly or mass.     Tenderness: There  is no abdominal tenderness.  Musculoskeletal:     Right lower leg: No edema.     Left lower leg: No edema.  Lymphadenopathy:     Cervical: No cervical adenopathy.     Right cervical: No superficial, deep or posterior cervical adenopathy.    Left cervical: No superficial, deep or posterior cervical adenopathy.     Upper Body:     Right upper body: No supraclavicular or axillary adenopathy.     Left upper body: No supraclavicular or axillary adenopathy.  Neurological:     General: No focal deficit present.     Mental Status: He is alert and oriented to person, place, and time.  Psychiatric:        Mood and Affect: Mood normal.        Behavior: Behavior normal.     LABS:   CBC    Component Value Date/Time   WBC 4.8 02/25/2024 1218   RBC 3.97 (L) 02/25/2024 1218   HGB 11.5 (L) 02/25/2024 1218   HGB 13.6 11/26/2023 1040   HGB 14.8 07/15/2023 1142   HGB 13.5 04/09/2017 1249   HCT 35.7 (L) 02/25/2024 1218   HCT 48.2 07/15/2023 1142   HCT 42.4 04/09/2017 1249   PLT 155 02/25/2024 1218   PLT 197 11/26/2023 1040   PLT 205 07/15/2023 1142   MCV 89.9 02/25/2024 1218   MCV 86 07/15/2023 1142   MCV 84.8 04/09/2017 1249   MCH 29.0 02/25/2024 1218   MCHC 32.2 02/25/2024 1218   RDW 19.3 (H) 02/25/2024 1218   RDW 13.1 07/15/2023 1142   RDW 15.4 (H) 04/09/2017 1249   LYMPHSABS 0.6 (L) 02/25/2024 1218   LYMPHSABS 1.1 07/31/2021 1026   LYMPHSABS 1.0 04/09/2017 1249   MONOABS 0.7 02/25/2024 1218   MONOABS 1.9 (H) 04/09/2017 1249   EOSABS 0.0 02/25/2024 1218  EOSABS 0.1 07/31/2021 1026   BASOSABS 0.0 02/25/2024 1218   BASOSABS 0.0 07/31/2021 1026   BASOSABS 0.0 04/09/2017 1249    CMP    Component Value Date/Time   NA 137 02/25/2024 1218   NA 140 07/15/2023 1142   NA 136 04/09/2017 1249   K 4.1 02/25/2024 1218   K 4.6 04/09/2017 1249   CL 100 02/25/2024 1218   CO2 27 02/25/2024 1218   CO2 27 04/09/2017 1249   GLUCOSE 102 (H) 02/25/2024 1218   GLUCOSE 103 04/09/2017 1249    BUN 21 02/25/2024 1218   BUN 13 07/15/2023 1142   BUN 23.9 04/09/2017 1249   CREATININE 1.04 02/25/2024 1218   CREATININE 1.10 11/26/2023 1040   CREATININE 1.21 (H) 05/08/2020 1051   CREATININE 1.4 (H) 04/09/2017 1249   CALCIUM  9.1 02/25/2024 1218   CALCIUM  9.6 04/09/2017 1249   PROT 6.7 02/25/2024 1218   PROT 6.9 03/04/2023 1515   PROT 7.8 04/09/2017 1249   ALBUMIN  3.6 02/25/2024 1218   ALBUMIN  4.6 03/04/2023 1515   ALBUMIN  3.6 04/09/2017 1249   AST 22 02/25/2024 1218   AST 20 11/26/2023 1040   AST 13 04/09/2017 1249   ALT 21 02/25/2024 1218   ALT 19 11/26/2023 1040   ALT 9 04/09/2017 1249   ALKPHOS 36 (L) 02/25/2024 1218   ALKPHOS 58 04/09/2017 1249   BILITOT 0.7 02/25/2024 1218   BILITOT 0.7 11/26/2023 1040   BILITOT 1.01 04/09/2017 1249   GFRNONAA >60 02/25/2024 1218   GFRNONAA >60 11/26/2023 1040   GFRNONAA 60 05/08/2020 1051   GFRAA >60 06/02/2020 1239   GFRAA 70 05/08/2020 1051     No results found for: "CEA1", "CEA" / No results found for: "CEA1", "CEA" Lab Results  Component Value Date   PSA1 3.5 07/05/2021   No results found for: "ZOX096" No results found for: "CAN125"  No results found for: "TOTALPROTELP", "ALBUMINELP", "A1GS", "A2GS", "BETS", "BETA2SER", "GAMS", "MSPIKE", "SPEI" No results found for: "TIBC", "FERRITIN", "IRONPCTSAT" No results found for: "LDH"   STUDIES:   CT CHEST W CONTRAST Result Date: 02/25/2024 CLINICAL DATA:  Non-small-cell lung cancer. Restaging. * Tracking Code: BO * EXAM: CT CHEST WITH CONTRAST TECHNIQUE: Multidetector CT imaging of the chest was performed during intravenous contrast administration. RADIATION DOSE REDUCTION: This exam was performed according to the departmental dose-optimization program which includes automated exposure control, adjustment of the mA and/or kV according to patient size and/or use of iterative reconstruction technique. CONTRAST:  75mL OMNIPAQUE  IOHEXOL  300 MG/ML  SOLN COMPARISON:  10/21/2023  FINDINGS: Cardiovascular: The heart size is normal. No substantial pericardial effusion. Coronary artery calcification is evident. Moderate atherosclerotic calcification is noted in the wall of the thoracic aorta. Left Port-A-Cath tip is positioned in the upper right atrium. Mediastinum/Nodes: No mediastinal lymphadenopathy. There is no hilar lymphadenopathy. The esophagus has normal imaging features. There is no axillary lymphadenopathy. Lungs/Pleura: Centrilobular and paraseptal emphysema evident. Post treatment scarring in the parahilar right lung is stable. Nodular component measured previously at 2.6 x 1.9 cm is 2.2 x 1.9 cm today on image 48/3. Post treatment scarring in the left lung is also similar to prior. Adjacent nodules in the posterolateral right lung show no substantial change. The more anterior nodule is 1.1 x 0.6 cm on image 100/series 3 today compared to 1.3 x 0.5 cm previously. The more posterior nodule measures 7 mm on image 101/3, unchanged from 7 mm previously. No new suspicious pulmonary nodule or mass. There is no evidence  of pleural effusion. Upper Abdomen: Visualized portion of the upper abdomen shows no acute findings. Musculoskeletal: No worrisome lytic or sclerotic osseous abnormality. IMPRESSION: 1. Stable exam. No new or progressive findings on today's study. 2. 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 3. Stable adjacent nodules in the posterolateral right lung. 4. Aortic Atherosclerosis (ICD10-I70.0) and Emphysema (ICD10-J43.9). Electronically Signed   By: Donnal Fusi M.D.   On: 02/25/2024 08:52

## 2024-02-25 ENCOUNTER — Inpatient Hospital Stay

## 2024-02-25 ENCOUNTER — Inpatient Hospital Stay (HOSPITAL_BASED_OUTPATIENT_CLINIC_OR_DEPARTMENT_OTHER): Admitting: Hematology

## 2024-02-25 VITALS — Ht 70.0 in | Wt 217.0 lb

## 2024-02-25 VITALS — BP 109/76 | HR 62 | Temp 97.7°F | Resp 18

## 2024-02-25 VITALS — BP 100/55 | HR 94 | Temp 98.9°F | Resp 18

## 2024-02-25 DIAGNOSIS — C3490 Malignant neoplasm of unspecified part of unspecified bronchus or lung: Secondary | ICD-10-CM | POA: Diagnosis not present

## 2024-02-25 DIAGNOSIS — I10 Essential (primary) hypertension: Secondary | ICD-10-CM | POA: Diagnosis not present

## 2024-02-25 DIAGNOSIS — Z5111 Encounter for antineoplastic chemotherapy: Secondary | ICD-10-CM | POA: Diagnosis not present

## 2024-02-25 DIAGNOSIS — C770 Secondary and unspecified malignant neoplasm of lymph nodes of head, face and neck: Secondary | ICD-10-CM | POA: Diagnosis not present

## 2024-02-25 DIAGNOSIS — G629 Polyneuropathy, unspecified: Secondary | ICD-10-CM | POA: Diagnosis not present

## 2024-02-25 DIAGNOSIS — R490 Dysphonia: Secondary | ICD-10-CM | POA: Diagnosis not present

## 2024-02-25 DIAGNOSIS — C3411 Malignant neoplasm of upper lobe, right bronchus or lung: Secondary | ICD-10-CM | POA: Diagnosis not present

## 2024-02-25 LAB — CBC WITH DIFFERENTIAL/PLATELET
Abs Immature Granulocytes: 0.03 10*3/uL (ref 0.00–0.07)
Basophils Absolute: 0 10*3/uL (ref 0.0–0.1)
Basophils Relative: 0 %
Eosinophils Absolute: 0 10*3/uL (ref 0.0–0.5)
Eosinophils Relative: 1 %
HCT: 35.7 % — ABNORMAL LOW (ref 39.0–52.0)
Hemoglobin: 11.5 g/dL — ABNORMAL LOW (ref 13.0–17.0)
Immature Granulocytes: 1 %
Lymphocytes Relative: 12 %
Lymphs Abs: 0.6 10*3/uL — ABNORMAL LOW (ref 0.7–4.0)
MCH: 29 pg (ref 26.0–34.0)
MCHC: 32.2 g/dL (ref 30.0–36.0)
MCV: 89.9 fL (ref 80.0–100.0)
Monocytes Absolute: 0.7 10*3/uL (ref 0.1–1.0)
Monocytes Relative: 14 %
Neutro Abs: 3.5 10*3/uL (ref 1.7–7.7)
Neutrophils Relative %: 72 %
Platelets: 155 10*3/uL (ref 150–400)
RBC: 3.97 MIL/uL — ABNORMAL LOW (ref 4.22–5.81)
RDW: 19.3 % — ABNORMAL HIGH (ref 11.5–15.5)
WBC: 4.8 10*3/uL (ref 4.0–10.5)
nRBC: 0 % (ref 0.0–0.2)

## 2024-02-25 LAB — COMPREHENSIVE METABOLIC PANEL WITH GFR
ALT: 21 U/L (ref 0–44)
AST: 22 U/L (ref 15–41)
Albumin: 3.6 g/dL (ref 3.5–5.0)
Alkaline Phosphatase: 36 U/L — ABNORMAL LOW (ref 38–126)
Anion gap: 10 (ref 5–15)
BUN: 21 mg/dL (ref 8–23)
CO2: 27 mmol/L (ref 22–32)
Calcium: 9.1 mg/dL (ref 8.9–10.3)
Chloride: 100 mmol/L (ref 98–111)
Creatinine, Ser: 1.04 mg/dL (ref 0.61–1.24)
GFR, Estimated: >60 mL/min (ref >60)
Glucose, Bld: 102 mg/dL — ABNORMAL HIGH (ref 70–99)
Potassium: 4.1 mmol/L (ref 3.5–5.1)
Sodium: 137 mmol/L (ref 135–145)
Total Bilirubin: 0.7 mg/dL (ref 0.0–1.2)
Total Protein: 6.7 g/dL (ref 6.5–8.1)

## 2024-02-25 LAB — MAGNESIUM: Magnesium: 1.9 mg/dL (ref 1.7–2.4)

## 2024-02-25 MED ORDER — HEPARIN SOD (PORK) LOCK FLUSH 100 UNIT/ML IV SOLN
500.0000 [IU] | Freq: Once | INTRAVENOUS | Status: AC | PRN
  Administered 2024-02-25: 500 [IU]

## 2024-02-25 MED ORDER — SODIUM CHLORIDE 0.9% FLUSH
10.0000 mL | INTRAVENOUS | Status: DC | PRN
  Administered 2024-02-25: 10 mL

## 2024-02-25 MED ORDER — SODIUM CHLORIDE 0.9 % IV SOLN: INTRAVENOUS | Status: DC

## 2024-02-25 MED ORDER — SODIUM CHLORIDE 0.9% FLUSH
10.0000 mL | INTRAVENOUS | Status: DC | PRN
  Administered 2024-02-25: 10 mL via INTRAVENOUS

## 2024-02-25 MED ORDER — SODIUM CHLORIDE 0.9 % IV SOLN
10.0000 mg/kg | Freq: Once | INTRAVENOUS | Status: AC
  Administered 2024-02-25: 1000 mg via INTRAVENOUS
  Filled 2024-02-25: qty 20

## 2024-02-25 NOTE — Patient Instructions (Addendum)
 [facility] at [facility] Discharge Instructions   You were seen and examined today by Dr. Cheree Cords.  He reviewed the results of your lab work which are normal/stable.   He reviewed the results of your CT scan which shows the tumor is shrinking.   We will proceed with your treatment today. You will be receiving an immunotherapy drug called Imfinzi. It is given in the clinic once every 2 weeks initially, then if you tolerate it well, we can change the infusions to every 4 weeks. It is given for a total of one year.   Return as scheduled.    Thank you for choosing [facility] at [facility] to provide your oncology and hematology care.  To afford each patient quality time with our provider, please arrive at least 15 minutes before your scheduled appointment time.   If you have a lab appointment with the Cancer Center please come in thru the Main Entrance and check in at the main information desk.  You need to re-schedule your appointment should you arrive 10 or more minutes late.  We strive to give you quality time with our providers, and arriving late affects you and other patients whose appointments are after yours.  Also, if you no show three or more times for appointments you may be dismissed from the clinic at the providers discretion.     Again, thank you for choosing [facility].  Our hope is that these requests will decrease the amount of time that you wait before being seen by our physicians.       _____________________________________________________________  Should you have questions after your visit to [facility], please contact our office at [phone] and follow the prompts.  Our office hours are 8:00 a.m. and 4:30 p.m. Monday - Friday.  Please note that voicemails left after 4:00 p.m. may not be returned until the following business day.  We are closed weekends and major holidays.  You do have access to  a nurse 24-7, just call the main number to the clinic [phone] and do not press any options, hold on the line and a nurse will answer the phone.    For prescription refill requests, have your pharmacy contact our office and allow 72 hours.    Due to Covid, you will need to wear a mask upon entering the hospital. If you do not have a mask, a mask will be given to you at the Main Entrance upon arrival. For doctor visits, patients may have 1 support person age 69 or older with them. For treatment visits, patients can not have anyone with them due to social distancing guidelines and our immunocompromised population.

## 2024-02-25 NOTE — Progress Notes (Signed)
 OFF PATHWAY REGIMEN - Non-Small Cell Lung  No Change  Continue With Treatment as Ordered.  Original Decision Date/Time: 12/03/2023 14:30   [id number]:Carboplatin  + Paclitaxel  (2/50) + RT weekly x 6 weeks:   Administer weekly during RT:     Paclitaxel       Carboplatin    **Always confirm dose/schedule in your pharmacy ordering system**  Patient Characteristics: Local Recurrence Therapeutic Status: Local Recurrence Intent of Therapy: Curative Intent, Discussed with Patient

## 2024-02-25 NOTE — Progress Notes (Signed)
 Pharmacist Chemotherapy Monitoring - Initial Assessment    Anticipated start date: 02-25-24   The following has been reviewed per standard work regarding the patient's treatment regimen: The patient's diagnosis, treatment plan and drug doses, and organ/hematologic function Lab orders and baseline tests specific to treatment regimen  The treatment plan start date, drug sequencing, and pre-medications Prior authorization status  Patient's documented medication list, including drug-drug interaction screen and prescriptions for anti-emetics and supportive care specific to the treatment regimen The drug concentrations, fluid compatibility, administration routes, and timing of the medications to be used The patient's access for treatment and lifetime cumulative dose history, if applicable  The patient's medication allergies and previous infusion related reactions, if applicable   Changes made to treatment plan:  N/A  Follow up needed:  N/A   Randall Sullivan, [facility], 02/25/2024  2:09 PM

## 2024-02-25 NOTE — Patient Instructions (Signed)
 [facility] - A DEPT OF [facility]  Discharge Instructions: Thank you for choosing [facility] to provide your oncology and hematology care.  If you have a lab appointment with the Cancer Center - please note that after April 8th, 2024, all labs will be drawn in the cancer center.  You do not have to check in or register with the main entrance as you have in the past but will complete your check-in in the cancer center.  Wear comfortable clothing and clothing appropriate for easy access to any Portacath or PICC line.   We strive to give you quality time with your provider. You may need to reschedule your appointment if you arrive late (15 or more minutes).  Arriving late affects you and other patients whose appointments are after yours.  Also, if you miss three or more appointments without notifying the office, you may be dismissed from the clinic at the provider's discretion.      For prescription refill requests, have your pharmacy contact our office and allow 72 hours for refills to be completed.    Today you received the following chemotherapy and/or immunotherapy agents Imfinzi      To help prevent nausea and vomiting after your treatment, we encourage you to take your nausea medication as directed.  BELOW ARE SYMPTOMS THAT SHOULD BE REPORTED IMMEDIATELY: *FEVER GREATER THAN 100.4 F (38 C) OR HIGHER *CHILLS OR SWEATING *NAUSEA AND VOMITING THAT IS NOT CONTROLLED WITH YOUR NAUSEA MEDICATION *UNUSUAL SHORTNESS OF BREATH *UNUSUAL BRUISING OR BLEEDING *URINARY PROBLEMS (pain or burning when urinating, or frequent urination) *BOWEL PROBLEMS (unusual diarrhea, constipation, pain near the anus) TENDERNESS IN MOUTH AND THROAT WITH OR WITHOUT PRESENCE OF ULCERS (sore throat, sores in mouth, or a toothache) UNUSUAL RASH, SWELLING OR PAIN  UNUSUAL VAGINAL DISCHARGE OR ITCHING   Items with * indicate a potential emergency and should be followed up  as soon as possible or go to the Emergency Department if any problems should occur.  Please show the CHEMOTHERAPY ALERT CARD or IMMUNOTHERAPY ALERT CARD at check-in to the Emergency Department and triage nurse.  Should you have questions after your visit or need to cancel or reschedule your appointment, please contact [facility] [place] - A DEPT OF [facility] [phone]  and follow the prompts.  Office hours are 8:00 a.m. to 4:30 p.m. Monday - Friday. Please note that voicemails left after 4:00 p.m. may not be returned until the following business day.  We are closed weekends and major holidays. You have access to a nurse at all times for urgent questions. Please call the main number to the clinic [phone] and follow the prompts.  For any non-urgent questions, you may also contact your provider using MyChart. We now offer e-Visits for anyone 42 and older to request care online for non-urgent symptoms. For details visit [email or web address].   Also download the MyChart app! Go to the app store, search "MyChart", open the app, select [place], and log in with your MyChart username and password.

## 2024-02-25 NOTE — Progress Notes (Signed)
 Patient has been examined by Dr. Ellin Saba. Vital signs and labs have been reviewed by MD - ANC, Creatinine, LFTs, hemoglobin, and platelets are within treatment parameters per M.D. - pt may proceed with treatment.  Primary RN and pharmacy notified.

## 2024-02-25 NOTE — Progress Notes (Signed)
 Treatment given today per MD orders.  Stable during infusion without adverse affects.  Vital signs stable.  No complaints at this time.  Discharge from clinic via wheelchair in stable condition.  Alert and oriented X 3.  Follow up with [facility] as scheduled.

## 2024-02-25 NOTE — Progress Notes (Signed)
 DISCONTINUE OFF PATHWAY REGIMEN - Non-Small Cell Lung   [id number]  + Paclitaxel  (2/50) + RT weekly x 6 weeks:   Administer weekly during RT:     Paclitaxel       Carboplatin    **Always confirm dose/schedule in your pharmacy ordering system**  PRIOR TREATMENT: Off Pathway: Carboplatin  + Paclitaxel  (2/50) + RT weekly x 6 weeks  START OFF PATHWAY REGIMEN - Non-Small Cell Lung   [id number]:Durvalumab 10 mg/kg IV D1 q14 Days:   A cycle is every 14 days:     Durvalumab   **Always confirm dose/schedule in your pharmacy ordering system**  Patient Characteristics: Local Recurrence Therapeutic Status: Local Recurrence Intent of Therapy: Curative Intent, Discussed with Patient

## 2024-02-26 LAB — T4: T4, Total: 8.2 ug/dL (ref 4.5–12.0)

## 2024-02-26 NOTE — Progress Notes (Signed)
 24 hour post chemotherapy call back: per pt she is feeling well, eating well, no nausea, and no fatigue. RN educated pt on the importance to notifying the clinic if any complications occur and when to seek emergency care, pt verbalized understanding all questions answered at this time. Cancer center number given to patient.   Randall Sullivan [employer]

## 2024-02-26 NOTE — Progress Notes (Signed)
 Previous note entered by error.  RN attempted to call pt for 24 hour post chemotherapy call back and pt was not available. Voicemail left and RN stated for patient to call the cancer center if any complications occur.   Randall Sullivan [employer]

## 2024-02-29 ENCOUNTER — Other Ambulatory Visit: Payer: Self-pay | Admitting: Internal Medicine

## 2024-02-29 ENCOUNTER — Encounter: Payer: Self-pay | Admitting: Internal Medicine

## 2024-02-29 DIAGNOSIS — N401 Enlarged prostate with lower urinary tract symptoms: Secondary | ICD-10-CM

## 2024-03-02 ENCOUNTER — Other Ambulatory Visit: Payer: Self-pay | Admitting: Internal Medicine

## 2024-03-02 DIAGNOSIS — Z510 Encounter for antineoplastic radiation therapy: Secondary | ICD-10-CM | POA: Diagnosis not present

## 2024-03-02 DIAGNOSIS — E785 Hyperlipidemia, unspecified: Secondary | ICD-10-CM | POA: Diagnosis not present

## 2024-03-02 DIAGNOSIS — C771 Secondary and unspecified malignant neoplasm of intrathoracic lymph nodes: Secondary | ICD-10-CM | POA: Diagnosis not present

## 2024-03-02 DIAGNOSIS — I10 Essential (primary) hypertension: Secondary | ICD-10-CM | POA: Diagnosis not present

## 2024-03-02 DIAGNOSIS — Z8673 Personal history of transient ischemic attack (TIA), and cerebral infarction without residual deficits: Secondary | ICD-10-CM | POA: Diagnosis not present

## 2024-03-02 DIAGNOSIS — N401 Enlarged prostate with lower urinary tract symptoms: Secondary | ICD-10-CM

## 2024-03-02 DIAGNOSIS — C3411 Malignant neoplasm of upper lobe, right bronchus or lung: Secondary | ICD-10-CM | POA: Diagnosis not present

## 2024-03-08 ENCOUNTER — Telehealth: Payer: Self-pay | Admitting: *Deleted

## 2024-03-08 NOTE — Telephone Encounter (Signed)
 Spoke with pt's wife.  She had to move pt's appt to next week.  Told her to continue current warfarin schedule which is 1 tablet daily except 1/2 tablet on Tuesdays, Thursdays and Saturdays until INR appt on 6/18.  She verbalized understanding.

## 2024-03-08 NOTE — Telephone Encounter (Signed)
 Patient's wife is calling to r/s the patient's coumadin  appointment for June 18th. Patient's wife mentioned that the patient was placed on a new coumadin  schedule. Patient's wife requested to know if they should stick to the old schedule. Patient's wife requested if she does not answer to leave a detailed VM. Please advise.

## 2024-03-09 ENCOUNTER — Encounter

## 2024-03-09 ENCOUNTER — Inpatient Hospital Stay

## 2024-03-09 ENCOUNTER — Inpatient Hospital Stay: Attending: Internal Medicine

## 2024-03-09 ENCOUNTER — Inpatient Hospital Stay (HOSPITAL_BASED_OUTPATIENT_CLINIC_OR_DEPARTMENT_OTHER): Admitting: Hematology

## 2024-03-09 VITALS — Wt 215.6 lb

## 2024-03-09 DIAGNOSIS — J432 Centrilobular emphysema: Secondary | ICD-10-CM | POA: Insufficient documentation

## 2024-03-09 DIAGNOSIS — Z7952 Long term (current) use of systemic steroids: Secondary | ICD-10-CM | POA: Insufficient documentation

## 2024-03-09 DIAGNOSIS — I700 Atherosclerosis of aorta: Secondary | ICD-10-CM | POA: Insufficient documentation

## 2024-03-09 DIAGNOSIS — Z87891 Personal history of nicotine dependence: Secondary | ICD-10-CM | POA: Diagnosis not present

## 2024-03-09 DIAGNOSIS — Z923 Personal history of irradiation: Secondary | ICD-10-CM | POA: Diagnosis not present

## 2024-03-09 DIAGNOSIS — I739 Peripheral vascular disease, unspecified: Secondary | ICD-10-CM | POA: Insufficient documentation

## 2024-03-09 DIAGNOSIS — Z79899 Other long term (current) drug therapy: Secondary | ICD-10-CM | POA: Insufficient documentation

## 2024-03-09 DIAGNOSIS — E785 Hyperlipidemia, unspecified: Secondary | ICD-10-CM | POA: Diagnosis not present

## 2024-03-09 DIAGNOSIS — Z9981 Dependence on supplemental oxygen: Secondary | ICD-10-CM | POA: Diagnosis not present

## 2024-03-09 DIAGNOSIS — Z7901 Long term (current) use of anticoagulants: Secondary | ICD-10-CM | POA: Insufficient documentation

## 2024-03-09 DIAGNOSIS — I10 Essential (primary) hypertension: Secondary | ICD-10-CM | POA: Diagnosis not present

## 2024-03-09 DIAGNOSIS — Z801 Family history of malignant neoplasm of trachea, bronchus and lung: Secondary | ICD-10-CM | POA: Insufficient documentation

## 2024-03-09 DIAGNOSIS — C3491 Malignant neoplasm of unspecified part of right bronchus or lung: Secondary | ICD-10-CM | POA: Insufficient documentation

## 2024-03-09 DIAGNOSIS — Z8673 Personal history of transient ischemic attack (TIA), and cerebral infarction without residual deficits: Secondary | ICD-10-CM | POA: Diagnosis not present

## 2024-03-09 DIAGNOSIS — Z8042 Family history of malignant neoplasm of prostate: Secondary | ICD-10-CM | POA: Diagnosis not present

## 2024-03-09 DIAGNOSIS — Z5112 Encounter for antineoplastic immunotherapy: Secondary | ICD-10-CM | POA: Diagnosis not present

## 2024-03-09 DIAGNOSIS — Z87442 Personal history of urinary calculi: Secondary | ICD-10-CM | POA: Insufficient documentation

## 2024-03-09 DIAGNOSIS — G9389 Other specified disorders of brain: Secondary | ICD-10-CM | POA: Diagnosis not present

## 2024-03-09 DIAGNOSIS — C3490 Malignant neoplasm of unspecified part of unspecified bronchus or lung: Secondary | ICD-10-CM | POA: Diagnosis not present

## 2024-03-09 DIAGNOSIS — Z95828 Presence of other vascular implants and grafts: Secondary | ICD-10-CM

## 2024-03-09 DIAGNOSIS — R21 Rash and other nonspecific skin eruption: Secondary | ICD-10-CM | POA: Diagnosis not present

## 2024-03-09 DIAGNOSIS — G629 Polyneuropathy, unspecified: Secondary | ICD-10-CM | POA: Insufficient documentation

## 2024-03-09 DIAGNOSIS — I4891 Unspecified atrial fibrillation: Secondary | ICD-10-CM | POA: Insufficient documentation

## 2024-03-09 LAB — COMPREHENSIVE METABOLIC PANEL WITH GFR
ALT: 19 U/L (ref 0–44)
AST: 24 U/L (ref 15–41)
Albumin: 3.5 g/dL (ref 3.5–5.0)
Alkaline Phosphatase: 33 U/L — ABNORMAL LOW (ref 38–126)
Anion gap: 8 (ref 5–15)
BUN: 18 mg/dL (ref 8–23)
CO2: 27 mmol/L (ref 22–32)
Calcium: 9.1 mg/dL (ref 8.9–10.3)
Chloride: 103 mmol/L (ref 98–111)
Creatinine, Ser: 1.05 mg/dL (ref 0.61–1.24)
GFR, Estimated: >60 mL/min (ref >60)
Glucose, Bld: 121 mg/dL — ABNORMAL HIGH (ref 70–99)
Potassium: 3.7 mmol/L (ref 3.5–5.1)
Sodium: 138 mmol/L (ref 135–145)
Total Bilirubin: 0.7 mg/dL (ref 0.0–1.2)
Total Protein: 6.7 g/dL (ref 6.5–8.1)

## 2024-03-09 LAB — CBC WITH DIFFERENTIAL/PLATELET
Abs Immature Granulocytes: 0.03 10*3/uL (ref 0.00–0.07)
Basophils Absolute: 0 10*3/uL (ref 0.0–0.1)
Basophils Relative: 0 %
Eosinophils Absolute: 0.1 10*3/uL (ref 0.0–0.5)
Eosinophils Relative: 2 %
HCT: 35.5 % — ABNORMAL LOW (ref 39.0–52.0)
Hemoglobin: 11.2 g/dL — ABNORMAL LOW (ref 13.0–17.0)
Immature Granulocytes: 1 %
Lymphocytes Relative: 10 %
Lymphs Abs: 0.5 10*3/uL — ABNORMAL LOW (ref 0.7–4.0)
MCH: 28 pg (ref 26.0–34.0)
MCHC: 31.5 g/dL (ref 30.0–36.0)
MCV: 88.8 fL (ref 80.0–100.0)
Monocytes Absolute: 0.6 10*3/uL (ref 0.1–1.0)
Monocytes Relative: 13 %
Neutro Abs: 3.4 10*3/uL (ref 1.7–7.7)
Neutrophils Relative %: 74 %
Platelets: 180 10*3/uL (ref 150–400)
RBC: 4 MIL/uL — ABNORMAL LOW (ref 4.22–5.81)
RDW: 17.8 % — ABNORMAL HIGH (ref 11.5–15.5)
WBC: 4.7 10*3/uL (ref 4.0–10.5)
nRBC: 0 % (ref 0.0–0.2)

## 2024-03-09 LAB — MAGNESIUM: Magnesium: 1.8 mg/dL (ref 1.7–2.4)

## 2024-03-09 MED ORDER — SODIUM CHLORIDE FLUSH 0.9 % IV SOLN
10.0000 mL | Freq: Once | INTRAVENOUS | Status: AC
  Administered 2024-03-09: 10 mL via INTRAVENOUS
  Filled 2024-03-09: qty 10

## 2024-03-09 MED ORDER — PREDNISONE 10 MG PO TABS: 30.0000 mg | ORAL_TABLET | Freq: Every day | ORAL | 0 refills | Status: DC

## 2024-03-09 MED ORDER — HEPARIN SOD (PORK) LOCK FLUSH 100 UNIT/ML IV SOLN
500.0000 [IU] | Freq: Once | INTRAVENOUS | Status: AC
  Administered 2024-03-09: 500 [IU] via INTRAVENOUS

## 2024-03-09 MED ORDER — HYDROXYZINE HCL 25 MG PO TABS: 25.0000 mg | ORAL_TABLET | Freq: Three times a day (TID) | ORAL | 0 refills | Status: DC | PRN

## 2024-03-09 NOTE — Progress Notes (Signed)
 [facility] [address] [place] [address]   Clinic Day:  03/09/2024  Referring physician: Meldon Sport, MD  Patient Care Team: Meldon Sport, MD as PCP - General (Internal Medicine) [name], MD as Medical Oncologist (Medical Oncology) Gerhard Knuckles, RN as Oncology Nurse Navigator (Medical Oncology)   ASSESSMENT & PLAN:   Assessment:  1.  Recurrent adenocarcinoma of the lung: - Stage Ia (T1b N0 M0) adenocarcinoma of the left upper lobe, s/p left VATS, left upper lobectomy with mediastinal lymph node dissection by Dr. Luna Salinas on 08/01/2016 - Stage Ia moderately differentiated adenocarcinoma, 0.7 cm of the right upper lobe, s/p right VATS with wedge resection of right upper lobe nodule and bleb resection by Dr. Luna Salinas on 12/09/2018 - PET scan (08/19/2023): Dysregulation artifact degrades image quality.  Recurrent bronchogenic carcinoma in the medial right upper lobe along the resection margin with no evidence of distant metastatic disease.  Faint 6 mm nodule in the superior segment right lower lobe too small for PET resolution.  Focal hypermetabolism in the central prostate. - CT super D chest (10/21/2023): Solid 2.6 cm central right upper lobe pulmonary nodule along the wedge resection suture line.  Solid 1.3 cm and 0.7 cm peripheral right lower lobe lung nodules. - MRI brain (12/02/2023): Without IV contrast with no strong evidence of cerebral metastatic disease. - Right paratracheal lymph node FNA (10/24/2023): Adenocarcinoma - Foundation 1 CDX: Insufficient tissue - PD-L1 22 C3 (TPS): 5% - Foundation 1 blood NGS: TMB-low, MSI-high not detected, no other targetable mutations - Chemoradiation therapy with weekly carboplatin  and paclitaxel  started on 12/23/2023, completed on 02/03/2024 - Consolidation durvalumab  started on 02/25/2024  2.  Social/family history: - He lives at home with his wife Edwina Gram.  He had a stroke in 2022 which has caused  short-term memory loss, loss of right-sided peripheral vision and balance problems.  He currently walks with a cane and sometimes walker.  He is also on continuous oxygen  since hospitalization in December 2024.  Most of the day he spends time sitting in the chair.  He can wash dishes but does require some help with some of the ADLs.  Quit smoking in 2017. - Mother and sister had ovarian cancer.  Half brother had lung cancer.   Plan:  1.  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 - Consolidation durvalumab  started on 02/25/2024. - He has developed skin rash and itching on the trunk and upper thighs. - Labs today: Normal LFTs and creatinine.  CBC grossly normal.  T4 is 8.2. - As we are giving him prednisone  for his rash, we will hold his durvalumab  today.  Will reschedule it for next Tuesday.  RTC 3 weeks for follow-up.  2.  ICI induced skin rash: - 2 days after his first dose of Imfinzi , he developed rash and itching, mainly in the left lateral chest wall, groin and upper thighs.  He was using hydrocortisone  and Benadryl  gel without significant improvement. - Will start him on prednisone  30 mg daily x 5 days.  Will also give him prescription for Atarax to be taken as needed.  Will hold his immunotherapy today.  3.  Peripheral neuropathy: - Baseline numbness in the fingertips of both hands, right more than left from the stroke is stable.  No worsening after chemotherapy.  4.  Hypertension/A-fib: - Continue Toprol -XL daily and continue to hold Cardizem .  5.  Hypomagnesemia: - Continue magnesium   oxide twice daily.  Magnesium  is normal today.   No orders of the defined types were placed in this encounter.     [name],acting as a Neurosurgeon for [name], MD.,have documented all relevant documentation on the behalf of [name], MD,as directed by  [name], MD while in the presence of  [name], MD.  I, [name] MD, have reviewed the above documentation for accuracy and completeness, and I agree with the above.     [name], MD   6/10/202510:45 AM  CHIEF COMPLAINT/PURPOSE OF CONSULT:   Diagnosis: Recurrent stage III adenocarcinoma   Cancer Staging  Primary lung adenocarcinoma (HCC) Staging form: Lung, AJCC 8th Edition - Clinical: Stage IA2 (cT1b, cN0, cM0) - Signed by Randall Simas, MD on 12/04/2020  Recurrent non-small cell lung cancer (NSCLC) (HCC) Staging form: Lung, AJCC V9 - Clinical stage from 12/03/2023: Stage IA3 (rcT1c, cN0, cM0) - Unsigned    Prior Therapy: Left upper lobectomy (2017) and right upper lobectomy (2020)  Current Therapy: Concurrent chemoradiation therapy   HISTORY OF PRESENT ILLNESS:   Oncology History  Primary lung adenocarcinoma (HCC)  10/15/2016 Initial Diagnosis   Primary lung adenocarcinoma (HCC)   12/04/2020 Cancer Staging   Staging form: Lung, AJCC 8th Edition - Clinical: Stage IA2 (cT1b, cN0, cM0) - Signed by Randall Simas, MD on 12/04/2020   Recurrent non-small cell lung cancer (NSCLC) (HCC)  12/03/2023 Initial Diagnosis   Recurrent non-small cell lung cancer (NSCLC) (HCC)   12/23/2023 - 01/27/2024 Chemotherapy   Patient is on Treatment Plan : LUNG Carboplatin  + Paclitaxel  + XRT q7d     02/25/2024 -  Chemotherapy   Patient is on Treatment Plan : LUNG Durvalumab  (10) q14d         Randall Sullivan is a 73 y.o. male presenting to clinic today for evaluation of recurrent adenocarcinoma of right lung, non small cell type at the request of Dr. Marguerita Shih.  Randall Sullivan was first diagnosed in 2017 with Stage IA (T1b, N0, M0) non-small cell lung cancer, well-differentiated adenocarcinoma presented with left upper lobe pulmonary nodule. He then underwent left VATS and left upper lobectomy with mediastinal lymph node dissection under Dr. Luna Salinas on 08/01/2016. Tell has since been under surveillance for left lung  adenocarcinoma.  He was diagnosed with stage Ia non-small cell lung cancer in March 2020 and underwent right VATS with wedge resection of right upper lobe nodule and bleb resection under Dr. Luna Salinas on 12/10/2018. He was under observation until a CT scan on 07/08/23 found an increasing mass like soft tissue along the suture margin in the central right upper lobe. PET on 08/19/23 found: recurrent bronchogenic carcinoma in the medial right upper lobe along the resection margin with faint 0.6 cm nodule in the superior segment of the right lower lobe too small for PET resolution.   Selmer then had a bronchoscopy on 10/24/23 with right paratracheal lymph node biopsy and endobronchial valve replacement in the anterior apical, posterior segments of right upper lobe, and superior segment of right lower lobe. Pathology of lymph node biopsy showed: adenocarcinoma. Molecular pathology showed a PDL1 score of 5%.   His cancer is now considered Stage III NSCLC adenocarcinoma due to right paratracheal lymph node involvement. Jash had discussion of treatment with Dr. Marguerita Shih on 11/26/23 that consisted of weekly mild chemotherapy with adjunct radiation at [facility] with their side effects.   MRI brain done on 12/02/23 showed: No strong evidence of cerebral metastatic disease on this noncontrast exam. Expected evolution of  large Left PCA territory infarct since 2022. Encephalomalacia and mild hemosiderin. No acute intracranial abnormality identified.  Kiyoshi has a history of stroke, with symptoms of fatigue, weakness, vision changes, and hoarseness. He also has pertinent history of COPD and requires at home oxygen .  Today, he states that he is doing well overall. His appetite level is at 100%. His energy level is at 50%.  INTERVAL HISTORY:   GEORGIA BARIA is a 73 y.o. male presenting to the clinic today for follow-up of recurrent stage III NSCLC. He was last seen by me on 02/25/24.  Today, he states that he is doing well overall.  His appetite level is at 100%. His energy level is at 50%. He is accompanied by his wife.  Bradon notes a constant itching, erythematous rash on the left side of the lateral chest wall, groin area, and bilateral upper thighs that began around 2 days after treatment. He has been using hydrocortisone  cream and benadryl  gel with minimal improvement in rash or itching.   He denies any cough, diarrhea, nausea, or vomiting. His baseline SOB is stable.   PAST MEDICAL HISTORY:   Past Medical History: Past Medical History:  Diagnosis Date   A-fib (HCC) 2017   after last lung surgery patient had a history if Afib documented in hospital    AAA (abdominal aortic aneurysm) (HCC)    4.0 cm 09/17/22 US    Acute appendicitis 07/14/2021   Arthritis    back & legs ( knees)   Cancer (HCC)    lung   Complication of anesthesia    agitated upon waking from anesth.    Dyspnea    Dysrhythmia    A. Fib   Emphysema    Emphysema of lung (HCC)    History of kidney stones    Hyperlipidemia    Hypertension    Oxygen  dependent    2L continuous   Peripheral vascular disease (HCC)    Aortic Aneurysm   Pneumonia    Pre-diabetes    Sleep apnea    No OSA, but wears 2L [place] O2 continuous including at night   Stroke [facility]) 2022   Right sided weakness, decreased hearing, balance and memory loss   Tobacco abuse     Surgical History: Past Surgical History:  Procedure Laterality Date   HERNIA REPAIR Bilateral 1999   umbilical   IR IMAGING GUIDED PORT INSERTION  12/09/2023   LAPAROSCOPIC APPENDECTOMY N/A 07/15/2021   Procedure: APPENDECTOMY LAPAROSCOPIC;  Surgeon: Awilda Bogus, MD;  Location: [facility];  Service: General;  Laterality: N/A;   VIDEO ASSISTED THORACOSCOPY Right 12/21/2018   Procedure: VIDEO ASSISTED THORACOSCOPY AND RESECTION OF BLEBS RIGHT LOWER LOBE;  Surgeon: Zelphia Higashi, MD;  Location: [facility] OR;  Service: Thoracic;  Laterality: Right;   VIDEO ASSISTED THORACOSCOPY (VATS)/ LOBECTOMY  Left 08/01/2016   Procedure: VIDEO ASSISTED THORACOSCOPY (VATS)/ LEFT UPPER LOBECTOMY with lymph node sampling and onQ placement;  Surgeon: Zelphia Higashi, MD;  Location: [facility] OR;  Service: Thoracic;  Laterality: Left;   VIDEO ASSISTED THORACOSCOPY (VATS)/WEDGE RESECTION Right 12/09/2018   Procedure: VIDEO ASSISTED THORACOSCOPY (VATS)/WEDGE RESECTION;  Surgeon: Zelphia Higashi, MD;  Location: [facility];  Service: Thoracic;  Laterality: Right;   VIDEO BRONCHOSCOPY Bilateral 12/21/2018   Procedure: VIDEO BRONCHOSCOPY;  Surgeon: Zelphia Higashi, MD;  Location: [facility];  Service: Thoracic;  Laterality: Bilateral;   VIDEO BRONCHOSCOPY WITH ENDOBRONCHIAL NAVIGATION N/A 07/03/2016   Procedure: VIDEO BRONCHOSCOPY WITH ENDOBRONCHIAL NAVIGATION;  Surgeon: Milon Aloe  Luna Salinas, MD;  Location: [facility];  Service: Thoracic;  Laterality: N/A;   VIDEO BRONCHOSCOPY WITH ENDOBRONCHIAL NAVIGATION N/A 10/24/2023   Procedure: VIDEO BRONCHOSCOPY WITH ENDOBRONCHIAL NAVIGATION;  Surgeon: Zelphia Higashi, MD;  Location: [facility];  Service: Thoracic;  Laterality: N/A;   VIDEO BRONCHOSCOPY WITH ENDOBRONCHIAL ULTRASOUND N/A 10/24/2023   Procedure: VIDEO BRONCHOSCOPY WITH ENDOBRONCHIAL ULTRASOUND;  Surgeon: Zelphia Higashi, MD;  Location: [facility];  Service: Thoracic;  Laterality: N/A;   VIDEO BRONCHOSCOPY WITH INSERTION OF INTERBRONCHIAL VALVE (IBV) N/A 12/11/2018   Procedure: VIDEO BRONCHOSCOPY WITH INSERTION OF INTERBRONCHIAL VALVE (IBV);  Surgeon: Zelphia Higashi, MD;  Location: [facility] OR;  Service: Thoracic;  Laterality: N/A;   VIDEO BRONCHOSCOPY WITH INSERTION OF INTERBRONCHIAL VALVE (IBV) N/A 02/10/2019   Procedure: VIDEO BRONCHOSCOPY WITH REMOVAL OF INTERBRONCHIAL VALVE (IBV);  Surgeon: Zelphia Higashi, MD;  Location: [facility] OR;  Service: Thoracic;  Laterality: N/A;    Social History: Social History   Socioeconomic History   Marital status: Married    Spouse name: Not on file   Number of children:  2   Years of education: Not on file   Highest education level: 9th grade  Occupational History   Occupation: Vinyl siding   Occupation: Retired  Tobacco Use   Smoking status: Former    Current packs/day: 0.00    Average packs/day: 1 pack/day for 40.0 years (40.0 ttl pk-yrs)    Types: Cigarettes    Start date: 05/28/1976    Quit date: 05/28/2016    Years since quitting: 7.7   Smokeless tobacco: Never  Vaping Use   Vaping status: Never Used  Substance and Sexual Activity   Alcohol use: Not Currently    Comment: quit- 2016   Drug use: No   Sexual activity: Not Currently  Other Topics Concern   Not on file  Social History Narrative   Married for 20 years,second.Lives with wife. 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   Social Drivers of Corporate investment banker Strain: High Risk (12/10/2023)   Overall Financial Resource Strain (CARDIA)    Difficulty of Paying Living Expenses: Very hard  Food Insecurity: No Food Insecurity (03/02/2024)   Received [facility]   Hunger Vital Sign    Worried About Running Out of Food in the Last Year: Never true    Ran Out of Food in the Last Year: Never true  Transportation Needs: No Transportation Needs (03/02/2024)   Received from [facility] - Transportation    Lack of Transportation (Medical): No    Lack of Transportation (Non-Medical): No  Physical Activity: Insufficiently Active (07/11/2023)   Exercise Vital Sign    Days of Exercise per Week: 3 days    Minutes of Exercise per Session: 10 min  Stress: Patient Declined (07/11/2023)   [employer] of Occupational Health - Occupational Stress Questionnaire    Feeling of Stress : Patient declined  Social Connections: Unknown (07/11/2023)   Social Connection and Isolation Panel [NHANES]    Frequency of Communication with Friends and Family: Twice a week    Frequency of Social Gatherings with Friends and Family: Once a week    Attends Religious Services: Patient  declined    Database administrator or Organizations: No    Attends Banker Meetings: Not on file    Marital Status: Married  Intimate Partner Violence: Patient Unable To Answer (09/17/2023)   Humiliation, Afraid, Rape, and Kick questionnaire    Fear of Current or  Ex-Partner: Patient unable to answer    Emotionally Abused: Patient unable to answer    Physically Abused: Patient unable to answer    Sexually Abused: Patient unable to answer    Family History: Family History  Problem Relation Age of Onset   Cancer Mother    Cancer Sister    Aneurysm Sister    Kidney disease Paternal Aunt    Asthma Brother    Cancer - Lung Brother    Cancer Brother    Arthritis Brother     Current Medications:  Current Outpatient Medications:    acetaminophen  (TYLENOL ) 325 MG tablet, Take 2 tablets (650 mg total) by mouth every 6 (six) hours as needed for mild pain (or Fever >/= 101)., Disp: , Rfl:    ALPRAZolam  (XANAX ) 0.5 MG tablet, Take 0.5 mg by mouth daily., Disp: , Rfl:    aluminum -magnesium  hydroxide 200-200 MG/5ML suspension, Take 15 mLs by mouth every 6 (six) hours as needed for indigestion (Mix 1:1 with lidocaine  and swish and spit)., Disp: 355 mL, Rfl: 2   Budeson-Glycopyrrol-Formoterol  (BREZTRI AEROSPHERE) 160-9-4.8 MCG/ACT AERO, Inhale 2 puffs into the lungs in the morning and at bedtime., Disp: , Rfl:    cetirizine  (ZYRTEC ) 10 MG tablet, Take 10 mg by mouth daily., Disp: , Rfl:    Cholecalciferol  (VITAMIN D3) 125 MCG (5000 UT) CAPS, Take 1 capsule (5,000 Units total) by mouth daily., Disp: 30 capsule, Rfl: 0   diltiazem  (CARDIZEM  CD) 180 MG 24 hr capsule, Take 1 capsule (180 mg total) by mouth daily., Disp: 90 capsule, Rfl: 3   fenofibrate  (TRICOR ) 145 MG tablet, Take 1 tablet (145 mg total) by mouth daily., Disp: 90 tablet, Rfl: 1   finasteride  (PROSCAR ) 5 MG tablet, Take 1 tablet by mouth once daily, Disp: 30 tablet, Rfl: 0   guaiFENesin  (MUCINEX ) 600 MG 12 hr tablet, Take  by mouth 2 (two) times daily., Disp: , Rfl:    hydrOXYzine (ATARAX) 25 MG tablet, Take 1 tablet (25 mg total) by mouth every 8 (eight) hours as needed for itching., Disp: 30 tablet, Rfl: 0   ipratropium-albuterol  (DUONEB) 0.5-2.5 (3) MG/3ML SOLN, USE 1 AMPULE IN NEBULIZER 4 TIMES DAILY AND AS NEEDED FOR SHORTNESS OF BREATH AND FOR WHEEZING, Disp: 450 mL, Rfl: 0   lidocaine  (XYLOCAINE ) 2 % solution, Use as directed 15 mLs in the mouth or throat every 6 (six) hours as needed for mouth pain (Mix 1:1 with Maalox and swish and spit)., Disp: 100 mL, Rfl: 2   magnesium  oxide (MAG-OX) 400 (240 Mg) MG tablet, Take 1 tablet (400 mg total) by mouth 2 (two) times daily., Disp: 60 tablet, Rfl: 3   meclizine  (ANTIVERT ) 25 MG tablet, Take 1 tablet (25 mg total) by mouth 3 (three) times daily as needed for dizziness., Disp: 30 tablet, Rfl: 0   metoprolol  succinate (TOPROL  XL) 50 MG 24 hr tablet, Take 1 tablet (50 mg total) by mouth daily. Take with or immediately following a meal., Disp: 30 tablet, Rfl: 2   Omega-3 Fatty Acids (FISH OIL ULTRA PO), Take 1,200 mg by mouth in the morning and at bedtime., Disp: , Rfl:    OXYGEN , Inhale 2 L into the lungs continuous. Hx of lung ca, copd, and emphysema, Disp: , Rfl:    Palonosetron  HCl (ALOXI  IV), Inject into the vein., Disp: , Rfl:    pantoprazole  (PROTONIX ) 40 MG tablet, Take 1 tablet (40 mg total) by mouth daily., Disp: 30 tablet, Rfl: 1   polyethylene glycol (  MIRALAX  / GLYCOLAX ) 17 g packet, Take 17 g by mouth daily as needed for mild constipation., Disp: , Rfl:    predniSONE  (DELTASONE ) 10 MG tablet, Take 3 tablets (30 mg total) by mouth daily with breakfast., Disp: 15 tablet, Rfl: 0   rosuvastatin  (CRESTOR ) 40 MG tablet, Take 1 tablet (40 mg total) by mouth daily., Disp: 90 tablet, Rfl: 3   tamsulosin  (FLOMAX ) 0.4 MG CAPS capsule, Take 2 capsules (0.8 mg total) by mouth daily., Disp: 180 capsule, Rfl: 3   warfarin (COUMADIN ) 5 MG tablet, Take 0.5-1 tablets (2.5-5 mg  total) by mouth See admin instructions. Take 2.5 mg by mouth Tuesday and Friday and take 5 mg on Monday, Wednesday, Thursday, Saturday and Sunday, Disp: 90 tablet, Rfl: 1   Allergies: No Known Allergies  REVIEW OF SYSTEMS:   Review of Systems  Constitutional:  Negative for chills, fatigue and fever.  HENT:   Negative for lump/mass, mouth sores, nosebleeds, sore throat and trouble swallowing.   Eyes:  Negative for eye problems.  Respiratory:  Positive for cough and shortness of breath.   Cardiovascular:  Negative for chest pain, leg swelling and palpitations.  Gastrointestinal:  Positive for abdominal pain (6/10 severity). Negative for constipation, diarrhea, nausea and vomiting.  Genitourinary:  Negative for bladder incontinence, difficulty urinating, dysuria, frequency, hematuria and nocturia.   Musculoskeletal:  Negative for arthralgias, back pain, flank pain, myalgias and neck pain.  Skin:  Positive for itching (abdominal). Negative for rash.  Neurological:  Positive for numbness (and tingling in hands). Negative for dizziness and headaches.  Hematological:  Does not bruise/bleed easily.  Psychiatric/Behavioral:  Positive for sleep disturbance. Negative for depression and suicidal ideas. The patient is not nervous/anxious.   All other systems reviewed and are negative.    VITALS:   Weight 215 lb 9.8 oz (97.8 kg).  Wt Readings from Last 3 Encounters:  03/09/24 215 lb 9.8 oz (97.8 kg)  02/25/24 217 lb (98.4 kg)  01/27/24 214 lb 4.6 oz (97.2 kg)    Body mass index is 30.94 kg/m.  Performance status (ECOG): 1 - Symptomatic but completely ambulatory  PHYSICAL EXAM:   Physical Exam Vitals and nursing note reviewed. Exam conducted with a chaperone present.  Constitutional:      Appearance: Normal appearance.  Cardiovascular:     Rate and Rhythm: Normal rate and regular rhythm.     Pulses: Normal pulses.     Heart sounds: Normal heart sounds.  Pulmonary:     Effort:  Pulmonary effort is normal.     Breath sounds: Normal breath sounds.  Abdominal:     Palpations: Abdomen is soft. There is no hepatomegaly, splenomegaly or mass.     Tenderness: There is no abdominal tenderness.  Musculoskeletal:     Right lower leg: No edema.     Left lower leg: No edema.  Lymphadenopathy:     Cervical: No cervical adenopathy.     Right cervical: No superficial, deep or posterior cervical adenopathy.    Left cervical: No superficial, deep or posterior cervical adenopathy.     Upper Body:     Right upper body: No supraclavicular or axillary adenopathy.     Left upper body: No supraclavicular or axillary adenopathy.  Skin:    Comments: +erythematous rash on left lateral chest and groin area  Neurological:     General: No focal deficit present.     Mental Status: He is alert and oriented to person, place, and time.  Psychiatric:  Mood and Affect: Mood normal.        Behavior: Behavior normal.     LABS:   CBC    Component Value Date/Time   WBC 4.7 03/09/2024 0915   RBC 4.00 (L) 03/09/2024 0915   HGB 11.2 (L) 03/09/2024 0915   HGB 13.6 11/26/2023 1040   HGB 14.8 07/15/2023 1142   HGB 13.5 04/09/2017 1249   HCT 35.5 (L) 03/09/2024 0915   HCT 48.2 07/15/2023 1142   HCT 42.4 04/09/2017 1249   PLT 180 03/09/2024 0915   PLT 197 11/26/2023 1040   PLT 205 07/15/2023 1142   MCV 88.8 03/09/2024 0915   MCV 86 07/15/2023 1142   MCV 84.8 04/09/2017 1249   MCH 28.0 03/09/2024 0915   MCHC 31.5 03/09/2024 0915   RDW 17.8 (H) 03/09/2024 0915   RDW 13.1 07/15/2023 1142   RDW 15.4 (H) 04/09/2017 1249   LYMPHSABS 0.5 (L) 03/09/2024 0915   LYMPHSABS 1.1 07/31/2021 1026   LYMPHSABS 1.0 04/09/2017 1249   MONOABS 0.6 03/09/2024 0915   MONOABS 1.9 (H) 04/09/2017 1249   EOSABS 0.1 03/09/2024 0915   EOSABS 0.1 07/31/2021 1026   BASOSABS 0.0 03/09/2024 0915   BASOSABS 0.0 07/31/2021 1026   BASOSABS 0.0 04/09/2017 1249    CMP    Component Value Date/Time   NA  138 03/09/2024 0915   NA 140 07/15/2023 1142   NA 136 04/09/2017 1249   K 3.7 03/09/2024 0915   K 4.6 04/09/2017 1249   CL 103 03/09/2024 0915   CO2 27 03/09/2024 0915   CO2 27 04/09/2017 1249   GLUCOSE 121 (H) 03/09/2024 0915   GLUCOSE 103 04/09/2017 1249   BUN 18 03/09/2024 0915   BUN 13 07/15/2023 1142   BUN 23.9 04/09/2017 1249   CREATININE 1.05 03/09/2024 0915   CREATININE 1.10 11/26/2023 1040   CREATININE 1.21 (H) 05/08/2020 1051   CREATININE 1.4 (H) 04/09/2017 1249   CALCIUM  9.1 03/09/2024 0915   CALCIUM  9.6 04/09/2017 1249   PROT 6.7 03/09/2024 0915   PROT 6.9 03/04/2023 1515   PROT 7.8 04/09/2017 1249   ALBUMIN  3.5 03/09/2024 0915   ALBUMIN  4.6 03/04/2023 1515   ALBUMIN  3.6 04/09/2017 1249   AST 24 03/09/2024 0915   AST 20 11/26/2023 1040   AST 13 04/09/2017 1249   ALT 19 03/09/2024 0915   ALT 19 11/26/2023 1040   ALT 9 04/09/2017 1249   ALKPHOS 33 (L) 03/09/2024 0915   ALKPHOS 58 04/09/2017 1249   BILITOT 0.7 03/09/2024 0915   BILITOT 0.7 11/26/2023 1040   BILITOT 1.01 04/09/2017 1249   GFRNONAA >60 03/09/2024 0915   GFRNONAA >60 11/26/2023 1040   GFRNONAA 60 05/08/2020 1051   GFRAA >60 06/02/2020 1239   GFRAA 70 05/08/2020 1051     No results found for: "CEA1", "CEA" / No results found for: "CEA1", "CEA" Lab Results  Component Value Date   PSA1 3.5 07/05/2021   No results found for: "ZOX096" No results found for: "CAN125"  No results found for: "TOTALPROTELP", "ALBUMINELP", "A1GS", "A2GS", "BETS", "BETA2SER", "GAMS", "MSPIKE", "SPEI" No results found for: "TIBC", "FERRITIN", "IRONPCTSAT" No results found for: "LDH"   STUDIES:   CT CHEST W CONTRAST Result Date: 02/25/2024 CLINICAL DATA:  Non-small-cell lung cancer. Restaging. * Tracking Code: BO * EXAM: CT CHEST WITH CONTRAST TECHNIQUE: Multidetector CT imaging of the chest was performed during intravenous contrast administration. RADIATION DOSE REDUCTION: This exam was performed according to the  departmental dose-optimization program which  includes automated exposure control, adjustment of the mA and/or kV according to patient size and/or use of iterative reconstruction technique. CONTRAST:  75mL OMNIPAQUE  IOHEXOL  300 MG/ML  SOLN COMPARISON:  10/21/2023 FINDINGS: Cardiovascular: The heart size is normal. No substantial pericardial effusion. Coronary artery calcification is evident. Moderate atherosclerotic calcification is noted in the wall of the thoracic aorta. Left Port-A-Cath tip is positioned in the upper right atrium. Mediastinum/Nodes: No mediastinal lymphadenopathy. There is no hilar lymphadenopathy. The esophagus has normal imaging features. There is no axillary lymphadenopathy. Lungs/Pleura: Centrilobular and paraseptal emphysema evident. Post treatment scarring in the parahilar right lung is stable. Nodular component measured previously at 2.6 x 1.9 cm is 2.2 x 1.9 cm today on image 48/3. Post treatment scarring in the left lung is also similar to prior. Adjacent nodules in the posterolateral right lung show no substantial change. The more anterior nodule is 1.1 x 0.6 cm on image 100/series 3 today compared to 1.3 x 0.5 cm previously. The more posterior nodule measures 7 mm on image 101/3, unchanged from 7 mm previously. No new suspicious pulmonary nodule or mass. There is no evidence of pleural effusion. Upper Abdomen: Visualized portion of the upper abdomen shows no acute findings. Musculoskeletal: No worrisome lytic or sclerotic osseous abnormality. IMPRESSION: 1. Stable exam. No new or progressive findings on today's study. 2. 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 3. Stable adjacent nodules in the posterolateral right lung. 4. Aortic Atherosclerosis (ICD10-I70.0) and Emphysema (ICD10-J43.9). Electronically Signed   By: Donnal Fusi M.D.   On: 02/25/2024 08:52

## 2024-03-09 NOTE — Patient Instructions (Signed)

## 2024-03-09 NOTE — Patient Instructions (Signed)
 [facility] [place] - A DEPT OF [facility]  Discharge Instructions: Thank you for choosing [facility] to provide your oncology and hematology care.  If you have a lab appointment with the Cancer Center - please note that after April 8th, 2024, all labs will be drawn in the cancer center.  You do not have to check in or register with the main entrance as you have in the past but will complete your check-in in the cancer center.  Wear comfortable clothing and clothing appropriate for easy access to any Portacath or PICC line.   We strive to give you quality time with your provider. You may need to reschedule your appointment if you arrive late (15 or more minutes).  Arriving late affects you and other patients whose appointments are after yours.  Also, if you miss three or more appointments without notifying the office, you may be dismissed from the clinic at the provider's discretion.      For prescription refill requests, have your pharmacy contact our office and allow 72 hours for refills to be completed.    Today you received the following port flush, no treatment today. Return as scheduled.   To help prevent nausea and vomiting after your treatment, we encourage you to take your nausea medication as directed.  BELOW ARE SYMPTOMS THAT SHOULD BE REPORTED IMMEDIATELY: *FEVER GREATER THAN 100.4 F (38 C) OR HIGHER *CHILLS OR SWEATING *NAUSEA AND VOMITING THAT IS NOT CONTROLLED WITH YOUR NAUSEA MEDICATION *UNUSUAL SHORTNESS OF BREATH *UNUSUAL BRUISING OR BLEEDING *URINARY PROBLEMS (pain or burning when urinating, or frequent urination) *BOWEL PROBLEMS (unusual diarrhea, constipation, pain near the anus) TENDERNESS IN MOUTH AND THROAT WITH OR WITHOUT PRESENCE OF ULCERS (sore throat, sores in mouth, or a toothache) UNUSUAL RASH, SWELLING OR PAIN  UNUSUAL VAGINAL DISCHARGE OR ITCHING   Items with * indicate a potential emergency and should be followed up  as soon as possible or go to the Emergency Department if any problems should occur.  Please show the CHEMOTHERAPY ALERT CARD or IMMUNOTHERAPY ALERT CARD at check-in to the Emergency Department and triage nurse.  Should you have questions after your visit or need to cancel or reschedule your appointment, please contact [facility] [place] - A DEPT OF [facility] [phone]  and follow the prompts.  Office hours are 8:00 a.m. to 4:30 p.m. Monday - Friday. Please note that voicemails left after 4:00 p.m. may not be returned until the following business day.  We are closed weekends and major holidays. You have access to a nurse at all times for urgent questions. Please call the main number to the clinic [phone] and follow the prompts.  For any non-urgent questions, you may also contact your provider using MyChart. We now offer e-Visits for anyone 48 and older to request care online for non-urgent symptoms. For details visit [email or web address].   Also download the MyChart app! Go to the app store, search "MyChart", open the app, select [place], and log in with your MyChart username and password.

## 2024-03-09 NOTE — Progress Notes (Signed)
 NO treatment today per Dr. Cheree Cords.

## 2024-03-10 ENCOUNTER — Encounter: Payer: Self-pay | Admitting: Internal Medicine

## 2024-03-10 ENCOUNTER — Other Ambulatory Visit: Payer: Self-pay

## 2024-03-10 MED ORDER — PANTOPRAZOLE SODIUM 40 MG PO TBEC: 40.0000 mg | DELAYED_RELEASE_TABLET | Freq: Every day | ORAL | 1 refills | Status: DC

## 2024-03-16 ENCOUNTER — Inpatient Hospital Stay

## 2024-03-16 VITALS — BP 125/91 | HR 80 | Temp 97.7°F | Resp 19

## 2024-03-16 VITALS — BP 109/69 | HR 79 | Temp 97.6°F | Resp 18

## 2024-03-16 DIAGNOSIS — C3490 Malignant neoplasm of unspecified part of unspecified bronchus or lung: Secondary | ICD-10-CM

## 2024-03-16 DIAGNOSIS — I10 Essential (primary) hypertension: Secondary | ICD-10-CM | POA: Diagnosis not present

## 2024-03-16 DIAGNOSIS — R21 Rash and other nonspecific skin eruption: Secondary | ICD-10-CM | POA: Diagnosis not present

## 2024-03-16 DIAGNOSIS — Z5112 Encounter for antineoplastic immunotherapy: Secondary | ICD-10-CM | POA: Diagnosis not present

## 2024-03-16 DIAGNOSIS — Z95828 Presence of other vascular implants and grafts: Secondary | ICD-10-CM

## 2024-03-16 DIAGNOSIS — I4891 Unspecified atrial fibrillation: Secondary | ICD-10-CM | POA: Diagnosis not present

## 2024-03-16 DIAGNOSIS — C3491 Malignant neoplasm of unspecified part of right bronchus or lung: Secondary | ICD-10-CM | POA: Diagnosis not present

## 2024-03-16 DIAGNOSIS — G629 Polyneuropathy, unspecified: Secondary | ICD-10-CM | POA: Diagnosis not present

## 2024-03-16 LAB — COMPREHENSIVE METABOLIC PANEL WITH GFR
ALT: 23 U/L (ref 0–44)
AST: 24 U/L (ref 15–41)
Albumin: 3.6 g/dL (ref 3.5–5.0)
Alkaline Phosphatase: 36 U/L — ABNORMAL LOW (ref 38–126)
Anion gap: 10 (ref 5–15)
BUN: 22 mg/dL (ref 8–23)
CO2: 29 mmol/L (ref 22–32)
Calcium: 9 mg/dL (ref 8.9–10.3)
Chloride: 99 mmol/L (ref 98–111)
Creatinine, Ser: 1.05 mg/dL (ref 0.61–1.24)
GFR, Estimated: >60 mL/min (ref >60)
Glucose, Bld: 105 mg/dL — ABNORMAL HIGH (ref 70–99)
Potassium: 3.9 mmol/L (ref 3.5–5.1)
Sodium: 138 mmol/L (ref 135–145)
Total Bilirubin: 0.7 mg/dL (ref 0.0–1.2)
Total Protein: 6.6 g/dL (ref 6.5–8.1)

## 2024-03-16 LAB — CBC WITH DIFFERENTIAL/PLATELET
Abs Immature Granulocytes: 0.06 10*3/uL (ref 0.00–0.07)
Basophils Absolute: 0 10*3/uL (ref 0.0–0.1)
Basophils Relative: 0 %
Eosinophils Absolute: 0.1 10*3/uL (ref 0.0–0.5)
Eosinophils Relative: 1 %
HCT: 37.6 % — ABNORMAL LOW (ref 39.0–52.0)
Hemoglobin: 12.2 g/dL — ABNORMAL LOW (ref 13.0–17.0)
Immature Granulocytes: 1 %
Lymphocytes Relative: 9 %
Lymphs Abs: 0.5 10*3/uL — ABNORMAL LOW (ref 0.7–4.0)
MCH: 29.4 pg (ref 26.0–34.0)
MCHC: 32.4 g/dL (ref 30.0–36.0)
MCV: 90.6 fL (ref 80.0–100.0)
Monocytes Absolute: 0.7 10*3/uL (ref 0.1–1.0)
Monocytes Relative: 12 %
Neutro Abs: 4.8 10*3/uL (ref 1.7–7.7)
Neutrophils Relative %: 77 %
Platelets: 161 10*3/uL (ref 150–400)
RBC: 4.15 MIL/uL — ABNORMAL LOW (ref 4.22–5.81)
RDW: 17 % — ABNORMAL HIGH (ref 11.5–15.5)
WBC: 6.2 10*3/uL (ref 4.0–10.5)
nRBC: 0 % (ref 0.0–0.2)

## 2024-03-16 LAB — MAGNESIUM: Magnesium: 2 mg/dL (ref 1.7–2.4)

## 2024-03-16 MED ORDER — SODIUM CHLORIDE 0.9 % IV SOLN
10.0000 mg/kg | Freq: Once | INTRAVENOUS | Status: AC
  Administered 2024-03-16: 1000 mg via INTRAVENOUS
  Filled 2024-03-16: qty 20

## 2024-03-16 MED ORDER — ACETAMINOPHEN 325 MG PO TABS
650.0000 mg | ORAL_TABLET | Freq: Once | ORAL | Status: AC
  Administered 2024-03-16: 650 mg via ORAL
  Filled 2024-03-16: qty 2

## 2024-03-16 MED ORDER — SODIUM CHLORIDE 0.9% FLUSH
10.0000 mL | INTRAVENOUS | Status: DC | PRN
  Administered 2024-03-16: 10 mL

## 2024-03-16 MED ORDER — HEPARIN SOD (PORK) LOCK FLUSH 100 UNIT/ML IV SOLN
500.0000 [IU] | Freq: Once | INTRAVENOUS | Status: AC | PRN
  Administered 2024-03-16: 500 [IU]

## 2024-03-16 MED ORDER — SODIUM CHLORIDE 0.9 % IV SOLN: INTRAVENOUS | Status: DC

## 2024-03-16 MED ORDER — SODIUM CHLORIDE 0.9% FLUSH
10.0000 mL | INTRAVENOUS | Status: DC | PRN
  Administered 2024-03-16: 10 mL via INTRAVENOUS

## 2024-03-16 NOTE — Progress Notes (Signed)
 Confirmed with RN and patient they are no longer on Prednisone  30 mg daily.  DC'd off med list.  Augie Bliss, PharmD

## 2024-03-16 NOTE — Patient Instructions (Signed)
 [facility] [place] - A DEPT OF [facility]  Discharge Instructions: Thank you for choosing [facility] to provide your oncology and hematology care.  If you have a lab appointment with the Cancer Center - please note that after April 8th, 2024, all labs will be drawn in the cancer center.  You do not have to check in or register with the main entrance as you have in the past but will complete your check-in in the cancer center.  Wear comfortable clothing and clothing appropriate for easy access to any Portacath or PICC line.   We strive to give you quality time with your provider. You may need to reschedule your appointment if you arrive late (15 or more minutes).  Arriving late affects you and other patients whose appointments are after yours.  Also, if you miss three or more appointments without notifying the office, you may be dismissed from the clinic at the provider's discretion.      For prescription refill requests, have your pharmacy contact our office and allow 72 hours for refills to be completed.    Today you received the following chemotherapy and/or immunotherapy agents Imfinzi   To help prevent nausea and vomiting after your treatment, we encourage you to take your nausea medication as directed.  Durvalumab Injection What is this medication? DURVALUMAB (dur VAL ue mab) treats some types of cancer. It works by helping your immune system slow or stop the spread of cancer cells. It is a monoclonal antibody. This medicine may be used for other purposes; ask your health care provider or pharmacist if you have questions. COMMON BRAND NAME(S): IMFINZI What should I tell my care team before I take this medication? They need to know if you have any of these conditions: Allogeneic stem cell transplant (uses someone else's stem cells) Autoimmune diseases, such as Crohn disease, ulcerative colitis, lupus History of chest radiation Nervous system  problems, such as Guillain-Barre syndrome, myasthenia gravis Organ transplant An unusual or allergic reaction to durvalumab, other medications, foods, dyes, or preservatives Pregnant or trying to get pregnant Breast-feeding How should I use this medication? This medication is infused into a vein. It is given by your care team in a hospital or clinic setting. A special MedGuide will be given to you before each treatment. Be sure to read this information carefully each time. Talk to your care team about the use of this medication in children. Special care may be needed. Overdosage: If you think you have taken too much of this medicine contact a poison control center or emergency room at once. NOTE: This medicine is only for you. Do not share this medicine with others. What if I miss a dose? Keep appointments for follow-up doses. It is important not to miss your dose. Call your care team if you are unable to keep an appointment. What may interact with this medication? Interactions have not been studied. This list may not describe all possible interactions. Give your health care provider a list of all the medicines, herbs, non-prescription drugs, or dietary supplements you use. Also tell them if you smoke, drink alcohol, or use illegal drugs. Some items may interact with your medicine. What should I watch for while using this medication? Your condition will be monitored carefully while you are receiving this medication. You may need blood work while taking this medication. This medication may cause serious skin reactions. They can happen weeks to months after starting the medication. Contact  your care team right away if you notice fevers or flu-like symptoms with a rash. The rash may be red or purple and then turn into blisters or peeling of the skin. You may also notice a red rash with swelling of the face, lips, or lymph nodes in your neck or under your arms. Tell your care team right away if you  have any change in your eyesight. Talk to your care team if you may be pregnant. Serious birth defects can occur if you take this medication during pregnancy and for 3 months after the last dose. You will need a negative pregnancy test before starting this medication. Contraception is recommended while taking this medication and for 3 months after the last dose. Your care team can help you find the option that works for you. Do not breastfeed while taking this medication and for 3 months after the last dose. What side effects may I notice from receiving this medication? Side effects that you should report to your care team as soon as possible: Allergic reactions--skin rash, itching, hives, swelling of the face, lips, tongue, or throat Dry cough, shortness of breath or trouble breathing Eye pain, redness, irritation, or discharge with blurry or decreased vision Heart muscle inflammation--unusual weakness or fatigue, shortness of breath, chest pain, fast or irregular heartbeat, dizziness, swelling of the ankles, feet, or hands Hormone gland problems--headache, sensitivity to light, unusual weakness or fatigue, dizziness, fast or irregular heartbeat, increased sensitivity to cold or heat, excessive sweating, constipation, hair loss, increased thirst or amount of urine, tremors or shaking, irritability Infusion reactions--chest pain, shortness of breath or trouble breathing, feeling faint or lightheaded Kidney injury (glomerulonephritis)--decrease in the amount of urine, red or dark brown urine, foamy or bubbly urine, swelling of the ankles, hands, or feet Liver injury--right upper belly pain, loss of appetite, nausea, light-colored stool, dark yellow or brown urine, yellowing skin or eyes, unusual weakness or fatigue Pain, tingling, or numbness in the hands or feet, muscle weakness, change in vision, confusion or trouble speaking, loss of balance or coordination, trouble walking, seizures Rash, fever, and  swollen lymph nodes Redness, blistering, peeling, or loosening of the skin, including inside the mouth Sudden or severe stomach pain, bloody diarrhea, fever, nausea, vomiting Side effects that usually do not require medical attention (report these to your care team if they continue or are bothersome): Bone, joint, or muscle pain Diarrhea Fatigue Loss of appetite Nausea Skin rash This list may not describe all possible side effects. Call your doctor for medical advice about side effects. You may report side effects to FDA at 1-800-FDA-1088. Where should I keep my medication? This medication is given in a hospital or clinic. It will not be stored at home. NOTE: This sheet is a summary. It may not cover all possible information. If you have questions about this medicine, talk to your doctor, pharmacist, or health care provider.  2024 Elsevier/Gold Standard (2022-01-29 00:00:00)   BELOW ARE SYMPTOMS THAT SHOULD BE REPORTED IMMEDIATELY: *FEVER GREATER THAN 100.4 F (38 C) OR HIGHER *CHILLS OR SWEATING *NAUSEA AND VOMITING THAT IS NOT CONTROLLED WITH YOUR NAUSEA MEDICATION *UNUSUAL SHORTNESS OF BREATH *UNUSUAL BRUISING OR BLEEDING *URINARY PROBLEMS (pain or burning when urinating, or frequent urination) *BOWEL PROBLEMS (unusual diarrhea, constipation, pain near the anus) TENDERNESS IN MOUTH AND THROAT WITH OR WITHOUT PRESENCE OF ULCERS (sore throat, sores in mouth, or a toothache) UNUSUAL RASH, SWELLING OR PAIN  UNUSUAL VAGINAL DISCHARGE OR ITCHING   Items with * indicate a  potential emergency and should be followed up as soon as possible or go to the Emergency Department if any problems should occur.  Please show the CHEMOTHERAPY ALERT CARD or IMMUNOTHERAPY ALERT CARD at check-in to the Emergency Department and triage nurse.  Should you have questions after your visit or need to cancel or reschedule your appointment, please contact [facility] CANCER CTR [place] - A DEPT OF [facility] [phone]  and follow the prompts.  Office hours are 8:00 a.m. to 4:30 p.m. Monday - Friday. Please note that voicemails left after 4:00 p.m. may not be returned until the following business day.  We are closed weekends and major holidays. You have access to a nurse at all times for urgent questions. Please call the main number to the clinic [phone] and follow the prompts.  For any non-urgent questions, you may also contact your provider using MyChart. We now offer e-Visits for anyone 52 and older to request care online for non-urgent symptoms. For details visit [email or web address].   Also download the MyChart app! Go to the app store, search "MyChart", open the app, select [place], and log in with your MyChart username and password.

## 2024-03-16 NOTE — Progress Notes (Signed)
 Patient presents today for Imfinzi  infusion.  Patient is in satisfactory condition with no new complaints voiced.  Vital signs are stable.  Labs reviewed and all labs are within treatment parameters.  We will proceed with treatment per MD orders.    Treatment given today per MD orders. Tolerated infusion without adverse affects. Vital signs stable. No complaints at this time. Discharged from clinic via wheelchair in stable condition. Alert and oriented x 3. F/U with [facility] as scheduled.

## 2024-03-16 NOTE — Progress Notes (Signed)
 Patients port flushed without difficulty.  Good blood return noted with no bruising or swelling noted at site.  Patient remains accessed for treatment.

## 2024-03-17 ENCOUNTER — Ambulatory Visit: Attending: Internal Medicine

## 2024-03-17 DIAGNOSIS — I480 Paroxysmal atrial fibrillation: Secondary | ICD-10-CM | POA: Insufficient documentation

## 2024-03-17 DIAGNOSIS — Z5181 Encounter for therapeutic drug level monitoring: Secondary | ICD-10-CM | POA: Diagnosis not present

## 2024-03-17 LAB — POCT INR: INR: 3.6 — AB (ref 2.0–3.0)

## 2024-03-17 NOTE — Patient Instructions (Signed)
 Description   Hold warfarin tonight then decrease dose to 1/2 tablet daily except 1 tablet on Mondays, Wednesdays and Fridays.  Continue greens/salads  Recheck INR in 3 weeks.  Finished chemo and radiation

## 2024-03-23 ENCOUNTER — Inpatient Hospital Stay

## 2024-03-23 ENCOUNTER — Inpatient Hospital Stay: Admitting: Hematology

## 2024-03-25 ENCOUNTER — Other Ambulatory Visit: Payer: Self-pay

## 2024-03-26 ENCOUNTER — Encounter: Payer: Self-pay | Admitting: Internal Medicine

## 2024-03-26 ENCOUNTER — Other Ambulatory Visit: Payer: Self-pay | Admitting: Internal Medicine

## 2024-03-26 DIAGNOSIS — I10 Essential (primary) hypertension: Secondary | ICD-10-CM

## 2024-03-26 MED ORDER — METOPROLOL SUCCINATE ER 50 MG PO TB24: 50.0000 mg | ORAL_TABLET | Freq: Every day | ORAL | 1 refills | Status: DC

## 2024-03-28 ENCOUNTER — Other Ambulatory Visit: Payer: Self-pay | Admitting: Internal Medicine

## 2024-03-28 DIAGNOSIS — N401 Enlarged prostate with lower urinary tract symptoms: Secondary | ICD-10-CM

## 2024-03-29 ENCOUNTER — Encounter: Payer: Self-pay | Admitting: Internal Medicine

## 2024-03-30 ENCOUNTER — Inpatient Hospital Stay

## 2024-03-30 ENCOUNTER — Inpatient Hospital Stay (HOSPITAL_BASED_OUTPATIENT_CLINIC_OR_DEPARTMENT_OTHER): Admitting: Hematology

## 2024-03-30 ENCOUNTER — Ambulatory Visit: Admitting: Internal Medicine

## 2024-03-30 ENCOUNTER — Inpatient Hospital Stay: Attending: Internal Medicine

## 2024-03-30 VITALS — BP 120/77 | HR 74 | Temp 98.1°F | Resp 20 | Wt 214.9 lb

## 2024-03-30 VITALS — BP 118/80 | HR 78 | Temp 98.1°F | Resp 18

## 2024-03-30 DIAGNOSIS — C3490 Malignant neoplasm of unspecified part of unspecified bronchus or lung: Secondary | ICD-10-CM | POA: Diagnosis not present

## 2024-03-30 DIAGNOSIS — Z8041 Family history of malignant neoplasm of ovary: Secondary | ICD-10-CM | POA: Diagnosis not present

## 2024-03-30 DIAGNOSIS — E785 Hyperlipidemia, unspecified: Secondary | ICD-10-CM | POA: Diagnosis not present

## 2024-03-30 DIAGNOSIS — G4730 Sleep apnea, unspecified: Secondary | ICD-10-CM | POA: Diagnosis not present

## 2024-03-30 DIAGNOSIS — C3431 Malignant neoplasm of lower lobe, right bronchus or lung: Secondary | ICD-10-CM | POA: Insufficient documentation

## 2024-03-30 DIAGNOSIS — Z7901 Long term (current) use of anticoagulants: Secondary | ICD-10-CM | POA: Diagnosis not present

## 2024-03-30 DIAGNOSIS — Z5112 Encounter for antineoplastic immunotherapy: Secondary | ICD-10-CM | POA: Insufficient documentation

## 2024-03-30 DIAGNOSIS — I10 Essential (primary) hypertension: Secondary | ICD-10-CM | POA: Diagnosis not present

## 2024-03-30 DIAGNOSIS — Z79899 Other long term (current) drug therapy: Secondary | ICD-10-CM | POA: Insufficient documentation

## 2024-03-30 DIAGNOSIS — R599 Enlarged lymph nodes, unspecified: Secondary | ICD-10-CM | POA: Diagnosis not present

## 2024-03-30 DIAGNOSIS — Z801 Family history of malignant neoplasm of trachea, bronchus and lung: Secondary | ICD-10-CM | POA: Diagnosis not present

## 2024-03-30 DIAGNOSIS — R1013 Epigastric pain: Secondary | ICD-10-CM | POA: Insufficient documentation

## 2024-03-30 DIAGNOSIS — G629 Polyneuropathy, unspecified: Secondary | ICD-10-CM | POA: Diagnosis not present

## 2024-03-30 DIAGNOSIS — J439 Emphysema, unspecified: Secondary | ICD-10-CM | POA: Insufficient documentation

## 2024-03-30 DIAGNOSIS — I4891 Unspecified atrial fibrillation: Secondary | ICD-10-CM | POA: Diagnosis not present

## 2024-03-30 DIAGNOSIS — Z87442 Personal history of urinary calculi: Secondary | ICD-10-CM | POA: Insufficient documentation

## 2024-03-30 DIAGNOSIS — G9389 Other specified disorders of brain: Secondary | ICD-10-CM | POA: Diagnosis not present

## 2024-03-30 DIAGNOSIS — Z923 Personal history of irradiation: Secondary | ICD-10-CM | POA: Diagnosis not present

## 2024-03-30 DIAGNOSIS — Z8673 Personal history of transient ischemic attack (TIA), and cerebral infarction without residual deficits: Secondary | ICD-10-CM | POA: Diagnosis not present

## 2024-03-30 DIAGNOSIS — Z9221 Personal history of antineoplastic chemotherapy: Secondary | ICD-10-CM | POA: Insufficient documentation

## 2024-03-30 DIAGNOSIS — R21 Rash and other nonspecific skin eruption: Secondary | ICD-10-CM | POA: Insufficient documentation

## 2024-03-30 DIAGNOSIS — I493 Ventricular premature depolarization: Secondary | ICD-10-CM | POA: Insufficient documentation

## 2024-03-30 DIAGNOSIS — Z95828 Presence of other vascular implants and grafts: Secondary | ICD-10-CM

## 2024-03-30 LAB — CBC WITH DIFFERENTIAL/PLATELET
Abs Immature Granulocytes: 0.02 10*3/uL (ref 0.00–0.07)
Basophils Absolute: 0 10*3/uL (ref 0.0–0.1)
Basophils Relative: 0 %
Eosinophils Absolute: 0.2 10*3/uL (ref 0.0–0.5)
Eosinophils Relative: 3 %
HCT: 37.9 % — ABNORMAL LOW (ref 39.0–52.0)
Hemoglobin: 11.8 g/dL — ABNORMAL LOW (ref 13.0–17.0)
Immature Granulocytes: 0 %
Lymphocytes Relative: 7 %
Lymphs Abs: 0.4 10*3/uL — ABNORMAL LOW (ref 0.7–4.0)
MCH: 27.8 pg (ref 26.0–34.0)
MCHC: 31.1 g/dL (ref 30.0–36.0)
MCV: 89.4 fL (ref 80.0–100.0)
Monocytes Absolute: 0.7 10*3/uL (ref 0.1–1.0)
Monocytes Relative: 11 %
Neutro Abs: 4.6 10*3/uL (ref 1.7–7.7)
Neutrophils Relative %: 79 %
Platelets: 157 10*3/uL (ref 150–400)
RBC: 4.24 MIL/uL (ref 4.22–5.81)
RDW: 15.3 % (ref 11.5–15.5)
WBC: 5.8 10*3/uL (ref 4.0–10.5)
nRBC: 0 % (ref 0.0–0.2)

## 2024-03-30 LAB — COMPREHENSIVE METABOLIC PANEL WITH GFR
ALT: 19 U/L (ref 0–44)
AST: 22 U/L (ref 15–41)
Albumin: 3.6 g/dL (ref 3.5–5.0)
Alkaline Phosphatase: 34 U/L — ABNORMAL LOW (ref 38–126)
Anion gap: 9 (ref 5–15)
BUN: 21 mg/dL (ref 8–23)
CO2: 28 mmol/L (ref 22–32)
Calcium: 9.2 mg/dL (ref 8.9–10.3)
Chloride: 99 mmol/L (ref 98–111)
Creatinine, Ser: 1.04 mg/dL (ref 0.61–1.24)
GFR, Estimated: >60 mL/min (ref >60)
Glucose, Bld: 113 mg/dL — ABNORMAL HIGH (ref 70–99)
Potassium: 4 mmol/L (ref 3.5–5.1)
Sodium: 136 mmol/L (ref 135–145)
Total Bilirubin: 0.7 mg/dL (ref 0.0–1.2)
Total Protein: 6.9 g/dL (ref 6.5–8.1)

## 2024-03-30 LAB — TSH: TSH: 1.007 u[IU]/mL (ref 0.350–4.500)

## 2024-03-30 LAB — MAGNESIUM: Magnesium: 1.9 mg/dL (ref 1.7–2.4)

## 2024-03-30 MED ORDER — BETAMETHASONE DIPROPIONATE 0.05 % EX CREA: TOPICAL_CREAM | Freq: Two times a day (BID) | CUTANEOUS | 2 refills | Status: DC

## 2024-03-30 MED ORDER — SODIUM CHLORIDE 0.9 % IV SOLN
10.0000 mg/kg | Freq: Once | INTRAVENOUS | Status: AC
  Administered 2024-03-30: 1000 mg via INTRAVENOUS
  Filled 2024-03-30: qty 20

## 2024-03-30 MED ORDER — SODIUM CHLORIDE 0.9 % IV SOLN: INTRAVENOUS | Status: DC

## 2024-03-30 MED ORDER — SODIUM CHLORIDE FLUSH 0.9 % IV SOLN
10.0000 mL | Freq: Once | INTRAVENOUS | Status: AC
  Administered 2024-03-30: 10 mL via INTRAVENOUS
  Filled 2024-03-30: qty 10

## 2024-03-30 MED ORDER — HEPARIN SOD (PORK) LOCK FLUSH 100 UNIT/ML IV SOLN
500.0000 [IU] | Freq: Once | INTRAVENOUS | Status: AC | PRN
  Administered 2024-03-30: 500 [IU]

## 2024-03-30 NOTE — Patient Instructions (Signed)
 [facility] [place] - A DEPT OF [facility]  Discharge Instructions: Thank you for choosing [facility] to provide your oncology and hematology care.  If you have a lab appointment with the Cancer Center - please note that after April 8th, 2024, all labs will be drawn in the cancer center.  You do not have to check in or register with the main entrance as you have in the past but will complete your check-in in the cancer center.  Wear comfortable clothing and clothing appropriate for easy access to any Portacath or PICC line.   We strive to give you quality time with your provider. You may need to reschedule your appointment if you arrive late (15 or more minutes).  Arriving late affects you and other patients whose appointments are after yours.  Also, if you miss three or more appointments without notifying the office, you may be dismissed from the clinic at the provider's discretion.      For prescription refill requests, have your pharmacy contact our office and allow 72 hours for refills to be completed.    Today you received the following chemotherapy and/or immunotherapy agents imfiniz   To help prevent nausea and vomiting after your treatment, we encourage you to take your nausea medication as directed.  BELOW ARE SYMPTOMS THAT SHOULD BE REPORTED IMMEDIATELY: *FEVER GREATER THAN 100.4 F (38 C) OR HIGHER *CHILLS OR SWEATING *NAUSEA AND VOMITING THAT IS NOT CONTROLLED WITH YOUR NAUSEA MEDICATION *UNUSUAL SHORTNESS OF BREATH *UNUSUAL BRUISING OR BLEEDING *URINARY PROBLEMS (pain or burning when urinating, or frequent urination) *BOWEL PROBLEMS (unusual diarrhea, constipation, pain near the anus) TENDERNESS IN MOUTH AND THROAT WITH OR WITHOUT PRESENCE OF ULCERS (sore throat, sores in mouth, or a toothache) UNUSUAL RASH, SWELLING OR PAIN  UNUSUAL VAGINAL DISCHARGE OR ITCHING   Items with * indicate a potential emergency and should be followed up as  soon as possible or go to the Emergency Department if any problems should occur.  Please show the CHEMOTHERAPY ALERT CARD or IMMUNOTHERAPY ALERT CARD at check-in to the Emergency Department and triage nurse.  Should you have questions after your visit or need to cancel or reschedule your appointment, please contact [facility] [place] - A DEPT OF [facility] [phone]  and follow the prompts.  Office hours are 8:00 a.m. to 4:30 p.m. Monday - Friday. Please note that voicemails left after 4:00 p.m. may not be returned until the following business day.  We are closed weekends and major holidays. You have access to a nurse at all times for urgent questions. Please call the main number to the clinic [phone] and follow the prompts.  For any non-urgent questions, you may also contact your provider using MyChart. We now offer e-Visits for anyone 38 and older to request care online for non-urgent symptoms. For details visit [email or web address].   Also download the MyChart app! Go to the app store, search "MyChart", open the app, select [place], and log in with your MyChart username and password.

## 2024-03-30 NOTE — Progress Notes (Signed)
 Patient tolerated chemotherapy with no complaints voiced.  Side effects with management reviewed with understanding verbalized.  Port site clean and dry with no bruising or swelling noted at site.  Good blood return noted before and after administration of chemotherapy.  Band aid applied.  Patient left in satisfactory condition with VSS and no s/s of distress noted. All follow ups as scheduled.   Randall Sullivan [employer]

## 2024-03-30 NOTE — Patient Instructions (Addendum)

## 2024-03-30 NOTE — Progress Notes (Signed)
 Patient has been examined by Dr. Ellin Saba. Vital signs and labs have been reviewed by MD - ANC, Creatinine, LFTs, hemoglobin, and platelets are within treatment parameters per M.D. - pt may proceed with treatment.  Primary RN and pharmacy notified.

## 2024-03-30 NOTE — Progress Notes (Signed)
 [facility] [address], [place] [address]   Clinic Day:  03/30/2024  Referring physician: Tobie Suzzane POUR, MD  Patient Care Team: Tobie Suzzane POUR, MD as PCP - General (Internal Medicine) Rogers Hai, MD as Medical Oncologist (Medical Oncology) Celestia Joesph SQUIBB, RN as Oncology Nurse Navigator (Medical Oncology)   ASSESSMENT & PLAN:   Assessment:  1.  Recurrent adenocarcinoma of the lung: - Stage Ia (T1b N0 M0) adenocarcinoma of the left upper lobe, s/p left VATS, left upper lobectomy with mediastinal lymph node dissection by Dr. Kerrin on 08/01/2016 - Stage Ia moderately differentiated adenocarcinoma, 0.7 cm of the right upper lobe, s/p right VATS with wedge resection of right upper lobe nodule and bleb resection by Dr. Kerrin on 12/09/2018 - PET scan (08/19/2023): Dysregulation artifact degrades image quality.  Recurrent bronchogenic carcinoma in the medial right upper lobe along the resection margin with no evidence of distant metastatic disease.  Faint 6 mm nodule in the superior segment right lower lobe too small for PET resolution.  Focal hypermetabolism in the central prostate. - CT super D chest (10/21/2023): Solid 2.6 cm central right upper lobe pulmonary nodule along the wedge resection suture line.  Solid 1.3 cm and 0.7 cm peripheral right lower lobe lung nodules. - MRI brain (12/02/2023): Without IV contrast with no strong evidence of cerebral metastatic disease. - Right paratracheal lymph node FNA (10/24/2023): Adenocarcinoma - Foundation 1 CDX: Insufficient tissue - PD-L1 22 C3 (TPS): 5% - Foundation 1 blood NGS: TMB-low, MSI-high not detected, no other targetable mutations - Chemoradiation therapy with weekly carboplatin  and paclitaxel  started on 12/23/2023, completed on 02/03/2024 - Consolidation durvalumab  started on 02/25/2024  2.  Social/family history: - He lives at home with his wife [name].  He had a stroke in 2022 which has caused  short-term memory loss, loss of right-sided peripheral vision and balance problems.  He currently walks with a cane and sometimes walker.  He is also on continuous oxygen  since hospitalization in December 2024.  Most of the day he spends time sitting in the chair.  He can wash dishes but does require some help with some of the ADLs.  Quit smoking in 2017. - Mother and sister had ovarian cancer.  Half brother had lung cancer.   Plan:  1.  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 - Consolidation durvalumab  started on 02/25/2024.  Second dose on 03/16/2024. - Labs today: Normal LFTs and creatinine.  CBC grossly normal.  TSH is 1.007. - He reported epigastric mild pain which started 2 days ago, worse on coughing.  He is on pantoprazole .  He takes Tylenol  which helps.  It almost feels like acid pain.?  Recent prednisone  induced.  Recommend taking Maalox/Mylanta as needed. - He may proceed with treatment today.  Reevaluate in 2 weeks for toxicity assessment.  2.  ICI induced skin rash: - 2 days after the first dose of Imfinzi , he developed a rash on the left lateral chest wall, groin and upper thighs.  He was given prednisone  30 mg x 5 days. - He is continuing to use hydrocortisone  cream.  He reports some rash and itching in the lower abdomen and posterior thighs. - I have recommended starting on low-dose prednisone .  He does not want to do it yet.  I will prescribe betamethasone dipropionate 0.05% cream twice daily.  Reevaluate in 2 weeks.  3.  Peripheral neuropathy: - Baseline numbness  in the fingertips of hands right more than left from stroke is stable.  4.  Hypertension/A-fib: - Continue Toprol -XL daily and continue to hold Cardizem .  Blood pressure today is 120/77.  Rate is well-controlled.  5.  Hypomagnesemia: - He is taking magnesium  twice daily.  Magnesium  is 1.9.  Cut back to 1 tablet daily.   Orders Placed This  Encounter  Procedures   Magnesium     Standing Status:   Future    Expected Date:   04/27/2024    Expiration Date:   04/27/2025   CBC with Differential    Standing Status:   Future    Expected Date:   04/27/2024    Expiration Date:   04/27/2025   Comprehensive metabolic panel    Standing Status:   Future    Expected Date:   04/27/2024    Expiration Date:   04/27/2025      LILLETTE Hummingbird R Teague,acting as a scribe for Alean Stands, MD.,have documented all relevant documentation on the behalf of Alean Stands, MD,as directed by  Alean Stands, MD while in the presence of Alean Stands, MD.  I, Alean Stands MD, have reviewed the above documentation for accuracy and completeness, and I agree with the above.     Alean Stands, MD   7/1/20255:20 PM  CHIEF COMPLAINT/PURPOSE OF CONSULT:   Diagnosis: Recurrent stage III adenocarcinoma   Cancer Staging  Primary lung adenocarcinoma (HCC) Staging form: Lung, AJCC 8th Edition - Clinical: Stage IA2 (cT1b, cN0, cM0) - Signed by Sherrod Sherrod, MD on 12/04/2020  Recurrent non-small cell lung cancer (NSCLC) (HCC) Staging form: Lung, AJCC V9 - Clinical stage from 12/03/2023: Stage IA3 (rcT1c, rcN0, rcM0) - Unsigned    Prior Therapy: Left upper lobectomy (2017) and right upper lobectomy (2020)  Current Therapy: Concurrent chemoradiation therapy   HISTORY OF PRESENT ILLNESS:   Oncology History  Primary lung adenocarcinoma (HCC)  10/15/2016 Initial Diagnosis   Primary lung adenocarcinoma (HCC)   12/04/2020 Cancer Staging   Staging form: Lung, AJCC 8th Edition - Clinical: Stage IA2 (cT1b, cN0, cM0) - Signed by Sherrod Sherrod, MD on 12/04/2020   Recurrent non-small cell lung cancer (NSCLC) (HCC)  12/03/2023 Initial Diagnosis   Recurrent non-small cell lung cancer (NSCLC) (HCC)   12/23/2023 - 01/27/2024 Chemotherapy   Patient is on Treatment Plan : LUNG Carboplatin  + Paclitaxel  + XRT q7d     02/25/2024 -   Chemotherapy   Patient is on Treatment Plan : LUNG Durvalumab  (10) q14d         Randall Sullivan is a 73 y.o. male presenting to clinic today for evaluation of recurrent adenocarcinoma of right lung, non small cell type at the request of Dr. Sherrod.  Randall Sullivan was first diagnosed in 2017 with Stage IA (T1b, N0, M0) non-small cell lung cancer, well-differentiated adenocarcinoma presented with left upper lobe pulmonary nodule. He then underwent left VATS and left upper lobectomy with mediastinal lymph node dissection under Dr. Kerrin on 08/01/2016. Randall Sullivan has since been under surveillance for left lung adenocarcinoma.  He was diagnosed with stage Ia non-small cell lung cancer in March 2020 and underwent right VATS with wedge resection of right upper lobe nodule and bleb resection under Dr. Kerrin on 12/10/2018. He was under observation until a CT scan on 07/08/23 found an increasing mass like soft tissue along the suture margin in the central right upper lobe. PET on 08/19/23 found: recurrent bronchogenic carcinoma in the medial right upper lobe along the resection margin with faint 0.6 cm  nodule in the superior segment of the right lower lobe too small for PET resolution.   Randall Sullivan then had a bronchoscopy on 10/24/23 with right paratracheal lymph node biopsy and endobronchial valve replacement in the anterior apical, posterior segments of right upper lobe, and superior segment of right lower lobe. Pathology of lymph node biopsy showed: adenocarcinoma. Molecular pathology showed a PDL1 score of 5%.   His cancer is now considered Stage III NSCLC adenocarcinoma due to right paratracheal lymph node involvement. Randall Sullivan had discussion of treatment with Dr. Sherrod on 11/26/23 that consisted of weekly mild chemotherapy with adjunct radiation at [facility] with their side effects.   MRI brain done on 12/02/23 showed: No strong evidence of cerebral metastatic disease on this noncontrast exam. Expected evolution of large Left PCA  territory infarct since 2022. Encephalomalacia and mild hemosiderin. No acute intracranial abnormality identified.  Randall Sullivan has a history of stroke, with symptoms of fatigue, weakness, vision changes, and hoarseness. He also has pertinent history of COPD and requires at home oxygen .  Today, he states that he is doing well overall. His appetite level is at 100%. His energy level is at 50%.  INTERVAL HISTORY:   Randall Sullivan is a 73 y.o. male presenting to the clinic today for follow-up of recurrent stage III NSCLC. He was last seen by me on 03/09/24.  Today, he states that he is doing well overall. His appetite level is at 100%. His energy level is at 50%. He is accompanied by his wife.  Randall Sullivan reports constant, mild epigastric pain for the past few days that feels similar to having an ulcer. Pain is worsened when coughing and improved with OTC Tylenol . He has a history of this pain from prior treatment that resolved. He has not taken Maalox or Milantra. He does have acid reflux, though he is currently taking Pantoprazole . Randall Sullivan has a history of abdominal aortic aneurysm.   He denies any diarrhea from magnesium , which he is taking as prescribed.   Randall Sullivan reports a rash on the left side of the chest wall that has improved and occasionally itches. The rash is also in the groin area and the dorsal portion of the upper bilateral thighs. He states he is not using an adequate amount of lotion, though his wife has been applying hydrocortisone  cream to the affected areas with minimal improvement of symptoms. Randall Sullivan is prediabetic and does not wish to start on oral steroids until he has attempted steroid cream on the rash.  PAST MEDICAL HISTORY:   Past Medical History: Past Medical History:  Diagnosis Date   A-fib (HCC) 2017   after last lung surgery patient had a history if Afib documented in hospital    AAA (abdominal aortic aneurysm) (HCC)    4.0 cm 09/17/22 US    Acute appendicitis 07/14/2021   Arthritis     back & legs ( knees)   Cancer (HCC)    lung   Complication of anesthesia    agitated upon waking from anesth.    Dyspnea    Dysrhythmia    A. Fib   Emphysema    Emphysema of lung (HCC)    History of kidney stones    Hyperlipidemia    Hypertension    Oxygen  dependent    2L continuous   Peripheral vascular disease (HCC)    Aortic Aneurysm   Pneumonia    Pre-diabetes    Sleep apnea    No OSA, but wears 2L [place] O2 continuous including at night  Stroke [facility]) 2022   Right sided weakness, decreased hearing, balance and memory loss   Tobacco abuse     Surgical History: Past Surgical History:  Procedure Laterality Date   HERNIA REPAIR Bilateral 1999   umbilical   IR IMAGING GUIDED PORT INSERTION  12/09/2023   LAPAROSCOPIC APPENDECTOMY N/A 07/15/2021   Procedure: APPENDECTOMY LAPAROSCOPIC;  Surgeon: Kallie Manuelita BROCKS, MD;  Location: [facility];  Service: General;  Laterality: N/A;   VIDEO ASSISTED THORACOSCOPY Right 12/21/2018   Procedure: VIDEO ASSISTED THORACOSCOPY AND RESECTION OF BLEBS RIGHT LOWER LOBE;  Surgeon: Kerrin Elspeth BROCKS, MD;  Location: [facility] OR;  Service: Thoracic;  Laterality: Right;   VIDEO ASSISTED THORACOSCOPY (VATS)/ LOBECTOMY Left 08/01/2016   Procedure: VIDEO ASSISTED THORACOSCOPY (VATS)/ LEFT UPPER LOBECTOMY with lymph node sampling and onQ placement;  Surgeon: Elspeth BROCKS Kerrin, MD;  Location: [facility] OR;  Service: Thoracic;  Laterality: Left;   VIDEO ASSISTED THORACOSCOPY (VATS)/WEDGE RESECTION Right 12/09/2018   Procedure: VIDEO ASSISTED THORACOSCOPY (VATS)/WEDGE RESECTION;  Surgeon: Kerrin Elspeth BROCKS, MD;  Location: [facility] OR;  Service: Thoracic;  Laterality: Right;   VIDEO BRONCHOSCOPY Bilateral 12/21/2018   Procedure: VIDEO BRONCHOSCOPY;  Surgeon: Kerrin Elspeth BROCKS, MD;  Location: [facility];  Service: Thoracic;  Laterality: Bilateral;   VIDEO BRONCHOSCOPY WITH ENDOBRONCHIAL NAVIGATION N/A 07/03/2016   Procedure: VIDEO BRONCHOSCOPY WITH ENDOBRONCHIAL  NAVIGATION;  Surgeon: Elspeth BROCKS Kerrin, MD;  Location: [facility];  Service: Thoracic;  Laterality: N/A;   VIDEO BRONCHOSCOPY WITH ENDOBRONCHIAL NAVIGATION N/A 10/24/2023   Procedure: VIDEO BRONCHOSCOPY WITH ENDOBRONCHIAL NAVIGATION;  Surgeon: Kerrin Elspeth BROCKS, MD;  Location: [facility];  Service: Thoracic;  Laterality: N/A;   VIDEO BRONCHOSCOPY WITH ENDOBRONCHIAL ULTRASOUND N/A 10/24/2023   Procedure: VIDEO BRONCHOSCOPY WITH ENDOBRONCHIAL ULTRASOUND;  Surgeon: Kerrin Elspeth BROCKS, MD;  Location: [facility] OR;  Service: Thoracic;  Laterality: N/A;   VIDEO BRONCHOSCOPY WITH INSERTION OF INTERBRONCHIAL VALVE (IBV) N/A 12/11/2018   Procedure: VIDEO BRONCHOSCOPY WITH INSERTION OF INTERBRONCHIAL VALVE (IBV);  Surgeon: Kerrin Elspeth BROCKS, MD;  Location: [facility] OR;  Service: Thoracic;  Laterality: N/A;   VIDEO BRONCHOSCOPY WITH INSERTION OF INTERBRONCHIAL VALVE (IBV) N/A 02/10/2019   Procedure: VIDEO BRONCHOSCOPY WITH REMOVAL OF INTERBRONCHIAL VALVE (IBV);  Surgeon: Kerrin Elspeth BROCKS, MD;  Location: [facility] OR;  Service: Thoracic;  Laterality: N/A;    Social History: Social History   Socioeconomic History   Marital status: Married    Spouse name: Not on file   Number of children: 2   Years of education: Not on file   Highest education level: 9th grade  Occupational History   Occupation: Vinyl siding   Occupation: Retired  Tobacco Use   Smoking status: Former    Current packs/day: 0.00    Average packs/day: 1 pack/day for 40.0 years (40.0 ttl pk-yrs)    Types: Cigarettes    Start date: 05/28/1976    Quit date: 05/28/2016    Years since quitting: 7.8   Smokeless tobacco: Never  Vaping Use   Vaping status: Never Used  Substance and Sexual Activity   Alcohol use: Not Currently    Comment: quit- 2016   Drug use: No   Sexual activity: Not Currently  Other Topics Concern   Not on file  Social History Narrative   Married for 20 years,second.Lives with wife. 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    Social Drivers of Health   Financial Resource Strain: High Risk (12/10/2023)   Overall Financial Resource Strain (CARDIA)    Difficulty of Paying Living Expenses: Very hard  Food  Insecurity: No Food Insecurity (03/02/2024)   Received from [facility]   Hunger Vital Sign    Within the past 12 months, you worried that your food would run out before you got the money to buy more.: Never true    Within the past 12 months, the food you bought just didn't last and you didn't have money to get more.: Never true  Transportation Needs: No Transportation Needs (03/02/2024)   Received from [facility] - Transportation    Lack of Transportation (Medical): No    Lack of Transportation (Non-Medical): No  Physical Activity: Insufficiently Active (07/11/2023)   Exercise Vital Sign    Days of Exercise per Week: 3 days    Minutes of Exercise per Session: 10 min  Stress: Patient Declined (07/11/2023)   [employer] of Occupational Health - Occupational Stress Questionnaire    Feeling of Stress : Patient declined  Social Connections: Unknown (07/11/2023)   Social Connection and Isolation Panel    Frequency of Communication with Friends and Family: Twice a week    Frequency of Social Gatherings with Friends and Family: Once a week    Attends Religious Services: Patient declined    Database administrator or Organizations: No    Attends Engineer, structural: Not on file    Marital Status: Married  Intimate Partner Violence: Patient Unable To Answer (09/17/2023)   Humiliation, Afraid, Rape, and Kick questionnaire    Fear of Current or Ex-Partner: Patient unable to answer    Emotionally Abused: Patient unable to answer    Physically Abused: Patient unable to answer    Sexually Abused: Patient unable to answer    Family History: Family History  Problem Relation Age of Onset   Cancer Mother    Cancer Sister    Aneurysm Sister    Kidney disease Paternal Aunt    Asthma  Brother    Cancer - Lung Brother    Cancer Brother    Arthritis Brother     Current Medications:  Current Outpatient Medications:    betamethasone dipropionate 0.05 % cream, Apply topically 2 (two) times daily., Disp: 60 g, Rfl: 2   acetaminophen  (TYLENOL ) 325 MG tablet, Take 2 tablets (650 mg total) by mouth every 6 (six) hours as needed for mild pain (or Fever >/= 101)., Disp: , Rfl:    ALPRAZolam  (XANAX ) 0.5 MG tablet, Take 0.5 mg by mouth daily., Disp: , Rfl:    aluminum -magnesium  hydroxide 200-200 MG/5ML suspension, Take 15 mLs by mouth every 6 (six) hours as needed for indigestion (Mix 1:1 with lidocaine  and swish and spit)., Disp: 355 mL, Rfl: 2   Budeson-Glycopyrrol-Formoterol  (BREZTRI AEROSPHERE) 160-9-4.8 MCG/ACT AERO, Inhale 2 puffs into the lungs in the morning and at bedtime., Disp: , Rfl:    cetirizine  (ZYRTEC ) 10 MG tablet, Take 10 mg by mouth daily., Disp: , Rfl:    Cholecalciferol  (VITAMIN D3) 125 MCG (5000 UT) CAPS, Take 1 capsule (5,000 Units total) by mouth daily., Disp: 30 capsule, Rfl: 0   diltiazem  (CARDIZEM  CD) 180 MG 24 hr capsule, Take 1 capsule (180 mg total) by mouth daily., Disp: 90 capsule, Rfl: 3   fenofibrate  (TRICOR ) 145 MG tablet, Take 1 tablet (145 mg total) by mouth daily., Disp: 90 tablet, Rfl: 1   finasteride  (PROSCAR ) 5 MG tablet, Take 1 tablet by mouth once daily, Disp: 30 tablet, Rfl: 0   guaiFENesin  (MUCINEX ) 600 MG 12 hr tablet, Take by mouth 2 (two)  times daily., Disp: , Rfl:    hydrOXYzine  (ATARAX ) 25 MG tablet, Take 1 tablet (25 mg total) by mouth every 8 (eight) hours as needed for itching., Disp: 30 tablet, Rfl: 0   ipratropium-albuterol  (DUONEB) 0.5-2.5 (3) MG/3ML SOLN, USE 1 AMPULE IN NEBULIZER 4 TIMES DAILY AND AS NEEDED FOR SHORTNESS OF BREATH AND FOR WHEEZING, Disp: 450 mL, Rfl: 0   lidocaine  (XYLOCAINE ) 2 % solution, Use as directed 15 mLs in the mouth or throat every 6 (six) hours as needed for mouth pain (Mix 1:1 with Maalox and swish and  spit)., Disp: 100 mL, Rfl: 2   magnesium  oxide (MAG-OX) 400 (240 Mg) MG tablet, Take 1 tablet (400 mg total) by mouth 2 (two) times daily., Disp: 60 tablet, Rfl: 3   meclizine  (ANTIVERT ) 25 MG tablet, Take 1 tablet (25 mg total) by mouth 3 (three) times daily as needed for dizziness., Disp: 30 tablet, Rfl: 0   metoprolol  succinate (TOPROL  XL) 50 MG 24 hr tablet, Take 1 tablet (50 mg total) by mouth daily. Take with or immediately following a meal., Disp: 90 tablet, Rfl: 1   Omega-3 Fatty Acids (FISH OIL ULTRA PO), Take 1,200 mg by mouth in the morning and at bedtime., Disp: , Rfl:    OXYGEN , Inhale 2 L into the lungs continuous. Hx of lung ca, copd, and emphysema, Disp: , Rfl:    Palonosetron  HCl (ALOXI  IV), Inject into the vein., Disp: , Rfl:    pantoprazole  (PROTONIX ) 40 MG tablet, Take 1 tablet (40 mg total) by mouth daily., Disp: 30 tablet, Rfl: 1   polyethylene glycol (MIRALAX  / GLYCOLAX ) 17 g packet, Take 17 g by mouth daily as needed for mild constipation., Disp: , Rfl:    rosuvastatin  (CRESTOR ) 40 MG tablet, Take 1 tablet (40 mg total) by mouth daily., Disp: 90 tablet, Rfl: 3   tamsulosin  (FLOMAX ) 0.4 MG CAPS capsule, Take 2 capsules (0.8 mg total) by mouth daily., Disp: 180 capsule, Rfl: 3   warfarin (COUMADIN ) 5 MG tablet, Take 0.5-1 tablets (2.5-5 mg total) by mouth See admin instructions. Take 2.5 mg by mouth Tuesday and Friday and take 5 mg on Monday, Wednesday, Thursday, Saturday and Sunday, Disp: 90 tablet, Rfl: 1 No current facility-administered medications for this visit.  Facility-Administered Medications Ordered in Other Visits:    0.9 %  sodium chloride  infusion, , Intravenous, Continuous, Rogers Hai, MD, Stopped at 03/30/24 1327   Allergies: No Known Allergies  REVIEW OF SYSTEMS:   Review of Systems  Constitutional:  Negative for chills, fatigue and fever.  HENT:   Negative for lump/mass, mouth sores, nosebleeds, sore throat and trouble swallowing.   Eyes:   Negative for eye problems.  Respiratory:  Negative for cough and shortness of breath.   Cardiovascular:  Negative for chest pain, leg swelling and palpitations.  Gastrointestinal:  Positive for abdominal pain (epigastric area). Negative for constipation, diarrhea, nausea and vomiting.  Genitourinary:  Negative for bladder incontinence, difficulty urinating, dysuria, frequency, hematuria and nocturia.   Musculoskeletal:  Negative for arthralgias, back pain, flank pain, myalgias and neck pain.  Skin:  Negative for itching and rash.  Neurological:  Positive for numbness (in hands). Negative for dizziness and headaches.  Hematological:  Does not bruise/bleed easily.  Psychiatric/Behavioral:  Positive for sleep disturbance. Negative for depression and suicidal ideas. The patient is not nervous/anxious.   All other systems reviewed and are negative.    VITALS:   Blood pressure 120/77, pulse 74, temperature 98.1 F (36.7  C), temperature source Tympanic, resp. rate 20, weight 214 lb 15.2 oz (97.5 kg), SpO2 100%.  Wt Readings from Last 3 Encounters:  03/30/24 214 lb 15.2 oz (97.5 kg)  03/09/24 215 lb 9.8 oz (97.8 kg)  02/25/24 217 lb (98.4 kg)    Body mass index is 30.84 kg/m.  Performance status (ECOG): 1 - Symptomatic but completely ambulatory  PHYSICAL EXAM:   Physical Exam Vitals and nursing note reviewed. Exam conducted with a chaperone present.  Constitutional:      Appearance: Normal appearance.   Cardiovascular:     Rate and Rhythm: Normal rate and regular rhythm.     Pulses: Normal pulses.     Heart sounds: Normal heart sounds.  Pulmonary:     Effort: Pulmonary effort is normal.     Breath sounds: Normal breath sounds.  Abdominal:     Palpations: Abdomen is soft. There is no hepatomegaly, splenomegaly or mass.     Tenderness: There is no abdominal tenderness.   Musculoskeletal:     Right lower leg: No edema.     Left lower leg: No edema.  Lymphadenopathy:      Cervical: No cervical adenopathy.     Right cervical: No superficial, deep or posterior cervical adenopathy.    Left cervical: No superficial, deep or posterior cervical adenopathy.     Upper Body:     Right upper body: No supraclavicular or axillary adenopathy.     Left upper body: No supraclavicular or axillary adenopathy.   Neurological:     General: No focal deficit present.     Mental Status: He is alert and oriented to person, place, and time.   Psychiatric:        Mood and Affect: Mood normal.        Behavior: Behavior normal.     LABS:   CBC    Component Value Date/Time   WBC 5.8 03/30/2024 1005   RBC 4.24 03/30/2024 1005   HGB 11.8 (L) 03/30/2024 1005   HGB 13.6 11/26/2023 1040   HGB 14.8 07/15/2023 1142   HGB 13.5 04/09/2017 1249   HCT 37.9 (L) 03/30/2024 1005   HCT 48.2 07/15/2023 1142   HCT 42.4 04/09/2017 1249   PLT 157 03/30/2024 1005   PLT 197 11/26/2023 1040   PLT 205 07/15/2023 1142   MCV 89.4 03/30/2024 1005   MCV 86 07/15/2023 1142   MCV 84.8 04/09/2017 1249   MCH 27.8 03/30/2024 1005   MCHC 31.1 03/30/2024 1005   RDW 15.3 03/30/2024 1005   RDW 13.1 07/15/2023 1142   RDW 15.4 (H) 04/09/2017 1249   LYMPHSABS 0.4 (L) 03/30/2024 1005   LYMPHSABS 1.1 07/31/2021 1026   LYMPHSABS 1.0 04/09/2017 1249   MONOABS 0.7 03/30/2024 1005   MONOABS 1.9 (H) 04/09/2017 1249   EOSABS 0.2 03/30/2024 1005   EOSABS 0.1 07/31/2021 1026   BASOSABS 0.0 03/30/2024 1005   BASOSABS 0.0 07/31/2021 1026   BASOSABS 0.0 04/09/2017 1249    CMP    Component Value Date/Time   NA 136 03/30/2024 1005   NA 140 07/15/2023 1142   NA 136 04/09/2017 1249   K 4.0 03/30/2024 1005   K 4.6 04/09/2017 1249   CL 99 03/30/2024 1005   CO2 28 03/30/2024 1005   CO2 27 04/09/2017 1249   GLUCOSE 113 (H) 03/30/2024 1005   GLUCOSE 103 04/09/2017 1249   BUN 21 03/30/2024 1005   BUN 13 07/15/2023 1142   BUN 23.9 04/09/2017 1249  CREATININE 1.04 03/30/2024 1005   CREATININE 1.10  11/26/2023 1040   CREATININE 1.21 (H) 05/08/2020 1051   CREATININE 1.4 (H) 04/09/2017 1249   CALCIUM  9.2 03/30/2024 1005   CALCIUM  9.6 04/09/2017 1249   PROT 6.9 03/30/2024 1005   PROT 6.9 03/04/2023 1515   PROT 7.8 04/09/2017 1249   ALBUMIN  3.6 03/30/2024 1005   ALBUMIN  4.6 03/04/2023 1515   ALBUMIN  3.6 04/09/2017 1249   AST 22 03/30/2024 1005   AST 20 11/26/2023 1040   AST 13 04/09/2017 1249   ALT 19 03/30/2024 1005   ALT 19 11/26/2023 1040   ALT 9 04/09/2017 1249   ALKPHOS 34 (L) 03/30/2024 1005   ALKPHOS 58 04/09/2017 1249   BILITOT 0.7 03/30/2024 1005   BILITOT 0.7 11/26/2023 1040   BILITOT 1.01 04/09/2017 1249   GFRNONAA >60 03/30/2024 1005   GFRNONAA >60 11/26/2023 1040   GFRNONAA 60 05/08/2020 1051   GFRAA >60 06/02/2020 1239   GFRAA 70 05/08/2020 1051     No results found for: CEA1, CEA / No results found for: CEA1, CEA Lab Results  Component Value Date   PSA1 3.5 07/05/2021   No results found for: CAN199 No results found for: CAN125  No results found for: TOTALPROTELP, ALBUMINELP, A1GS, A2GS, BETS, BETA2SER, GAMS, MSPIKE, SPEI No results found for: TIBC, FERRITIN, IRONPCTSAT No results found for: LDH   STUDIES:   No results found.

## 2024-03-31 LAB — T4: T4, Total: 9.2 ug/dL (ref 4.5–12.0)

## 2024-04-06 ENCOUNTER — Inpatient Hospital Stay

## 2024-04-06 ENCOUNTER — Inpatient Hospital Stay: Admitting: Hematology

## 2024-04-06 ENCOUNTER — Ambulatory Visit: Attending: Internal Medicine | Admitting: *Deleted

## 2024-04-06 DIAGNOSIS — Z5181 Encounter for therapeutic drug level monitoring: Secondary | ICD-10-CM | POA: Insufficient documentation

## 2024-04-06 DIAGNOSIS — I480 Paroxysmal atrial fibrillation: Secondary | ICD-10-CM | POA: Diagnosis not present

## 2024-04-06 LAB — POCT INR: INR: 2.6 (ref 2.0–3.0)

## 2024-04-06 NOTE — Progress Notes (Signed)
Please see anticoagulation encounter.

## 2024-04-06 NOTE — Patient Instructions (Signed)
 Continue warfarin 1/2 tablet daily except 1 tablet on Mondays, Wednesdays and Fridays.  Continue greens/salads  Recheck INR in 4 weeks.  Finished chemo and radiation

## 2024-04-08 ENCOUNTER — Encounter: Payer: Self-pay | Admitting: Internal Medicine

## 2024-04-08 ENCOUNTER — Other Ambulatory Visit: Payer: Self-pay | Admitting: Internal Medicine

## 2024-04-08 ENCOUNTER — Other Ambulatory Visit: Payer: Self-pay

## 2024-04-08 DIAGNOSIS — J449 Chronic obstructive pulmonary disease, unspecified: Secondary | ICD-10-CM

## 2024-04-08 MED ORDER — BREZTRI AEROSPHERE 160-9-4.8 MCG/ACT IN AERO: 2.0000 | INHALATION_SPRAY | Freq: Two times a day (BID) | RESPIRATORY_TRACT | 4 refills | Status: DC

## 2024-04-10 ENCOUNTER — Other Ambulatory Visit: Payer: Self-pay

## 2024-04-13 ENCOUNTER — Inpatient Hospital Stay

## 2024-04-13 ENCOUNTER — Inpatient Hospital Stay (HOSPITAL_BASED_OUTPATIENT_CLINIC_OR_DEPARTMENT_OTHER): Admitting: Hematology

## 2024-04-13 VITALS — Wt 215.2 lb

## 2024-04-13 VITALS — BP 119/69 | HR 72 | Temp 96.8°F | Resp 20

## 2024-04-13 VITALS — BP 111/70 | HR 68 | Temp 97.0°F | Resp 20

## 2024-04-13 DIAGNOSIS — Z5112 Encounter for antineoplastic immunotherapy: Secondary | ICD-10-CM | POA: Diagnosis not present

## 2024-04-13 DIAGNOSIS — C3490 Malignant neoplasm of unspecified part of unspecified bronchus or lung: Secondary | ICD-10-CM | POA: Diagnosis not present

## 2024-04-13 DIAGNOSIS — C3431 Malignant neoplasm of lower lobe, right bronchus or lung: Secondary | ICD-10-CM | POA: Diagnosis not present

## 2024-04-13 DIAGNOSIS — R21 Rash and other nonspecific skin eruption: Secondary | ICD-10-CM | POA: Diagnosis not present

## 2024-04-13 DIAGNOSIS — R599 Enlarged lymph nodes, unspecified: Secondary | ICD-10-CM | POA: Diagnosis not present

## 2024-04-13 DIAGNOSIS — G629 Polyneuropathy, unspecified: Secondary | ICD-10-CM | POA: Diagnosis not present

## 2024-04-13 DIAGNOSIS — Z95828 Presence of other vascular implants and grafts: Secondary | ICD-10-CM

## 2024-04-13 DIAGNOSIS — R1013 Epigastric pain: Secondary | ICD-10-CM | POA: Diagnosis not present

## 2024-04-13 LAB — COMPREHENSIVE METABOLIC PANEL WITH GFR
ALT: 19 U/L (ref 0–44)
AST: 24 U/L (ref 15–41)
Albumin: 3.5 g/dL (ref 3.5–5.0)
Alkaline Phosphatase: 30 U/L — ABNORMAL LOW (ref 38–126)
Anion gap: 11 (ref 5–15)
BUN: 20 mg/dL (ref 8–23)
CO2: 27 mmol/L (ref 22–32)
Calcium: 9.2 mg/dL (ref 8.9–10.3)
Chloride: 100 mmol/L (ref 98–111)
Creatinine, Ser: 1.07 mg/dL (ref 0.61–1.24)
GFR, Estimated: >60 mL/min (ref >60)
Glucose, Bld: 135 mg/dL — ABNORMAL HIGH (ref 70–99)
Potassium: 3.8 mmol/L (ref 3.5–5.1)
Sodium: 138 mmol/L (ref 135–145)
Total Bilirubin: 0.6 mg/dL (ref 0.0–1.2)
Total Protein: 6.5 g/dL (ref 6.5–8.1)

## 2024-04-13 LAB — CBC WITH DIFFERENTIAL/PLATELET
Abs Immature Granulocytes: 0.01 K/uL (ref 0.00–0.07)
Basophils Absolute: 0 K/uL (ref 0.0–0.1)
Basophils Relative: 0 %
Eosinophils Absolute: 0.1 K/uL (ref 0.0–0.5)
Eosinophils Relative: 2 %
HCT: 36.7 % — ABNORMAL LOW (ref 39.0–52.0)
Hemoglobin: 11 g/dL — ABNORMAL LOW (ref 13.0–17.0)
Immature Granulocytes: 0 %
Lymphocytes Relative: 8 %
Lymphs Abs: 0.4 K/uL — ABNORMAL LOW (ref 0.7–4.0)
MCH: 26.6 pg (ref 26.0–34.0)
MCHC: 30 g/dL (ref 30.0–36.0)
MCV: 88.9 fL (ref 80.0–100.0)
Monocytes Absolute: 0.6 K/uL (ref 0.1–1.0)
Monocytes Relative: 12 %
Neutro Abs: 3.7 K/uL (ref 1.7–7.7)
Neutrophils Relative %: 78 %
Platelets: 166 K/uL (ref 150–400)
RBC: 4.13 MIL/uL — ABNORMAL LOW (ref 4.22–5.81)
RDW: 14.5 % (ref 11.5–15.5)
WBC: 4.8 K/uL (ref 4.0–10.5)
nRBC: 0 % (ref 0.0–0.2)

## 2024-04-13 LAB — MAGNESIUM: Magnesium: 1.8 mg/dL (ref 1.7–2.4)

## 2024-04-13 MED ORDER — SODIUM CHLORIDE 0.9 % IV SOLN: INTRAVENOUS | Status: DC

## 2024-04-13 MED ORDER — SODIUM CHLORIDE FLUSH 0.9 % IV SOLN
10.0000 mL | Freq: Once | INTRAVENOUS | Status: AC
  Administered 2024-04-13: 10 mL via INTRAVENOUS
  Filled 2024-04-13: qty 10

## 2024-04-13 MED ORDER — SODIUM CHLORIDE 0.9% FLUSH
10.0000 mL | INTRAVENOUS | Status: DC | PRN
  Administered 2024-04-13: 10 mL

## 2024-04-13 MED ORDER — HEPARIN SOD (PORK) LOCK FLUSH 100 UNIT/ML IV SOLN
500.0000 [IU] | Freq: Once | INTRAVENOUS | Status: AC | PRN
Start: 2024-04-13 — End: 2024-04-13
  Administered 2024-04-13: 500 [IU]

## 2024-04-13 MED ORDER — SODIUM CHLORIDE 0.9 % IV SOLN
10.0000 mg/kg | Freq: Once | INTRAVENOUS | Status: AC
  Administered 2024-04-13: 1000 mg via INTRAVENOUS
  Filled 2024-04-13: qty 20

## 2024-04-13 NOTE — Progress Notes (Signed)
 [facility] [address] [place] [address]   Clinic Day:  04/13/2024  Referring physician: Tobie Suzzane POUR, MD  Patient Care Team: Tobie Suzzane POUR, MD as PCP - General (Internal Medicine) Rogers Hai, MD as Medical Oncologist (Medical Oncology) Celestia Joesph SQUIBB, RN as Oncology Nurse Navigator (Medical Oncology)   ASSESSMENT & PLAN:   Assessment:  1.  Recurrent adenocarcinoma of the lung: - Stage Ia (T1b N0 M0) adenocarcinoma of the left upper lobe, s/p left VATS, left upper lobectomy with mediastinal lymph node dissection by Dr. Kerrin on 08/01/2016 - Stage Ia moderately differentiated adenocarcinoma, 0.7 cm of the right upper lobe, s/p right VATS with wedge resection of right upper lobe nodule and bleb resection by Dr. Kerrin on 12/09/2018 - PET scan (08/19/2023): Dysregulation artifact degrades image quality.  Recurrent bronchogenic carcinoma in the medial right upper lobe along the resection margin with no evidence of distant metastatic disease.  Faint 6 mm nodule in the superior segment right lower lobe too small for PET resolution.  Focal hypermetabolism in the central prostate. - CT super D chest (10/21/2023): Solid 2.6 cm central right upper lobe pulmonary nodule along the wedge resection suture line.  Solid 1.3 cm and 0.7 cm peripheral right lower lobe lung nodules. - MRI brain (12/02/2023): Without IV contrast with no strong evidence of cerebral metastatic disease. - Right paratracheal lymph node FNA (10/24/2023): Adenocarcinoma - Foundation 1 CDX: Insufficient tissue - PD-L1 22 C3 (TPS): 5% - Foundation 1 blood NGS: TMB-low, MSI-high not detected, no other targetable mutations - Chemoradiation therapy with weekly carboplatin  and paclitaxel  started on 12/23/2023, completed on 02/03/2024 - Consolidation durvalumab  started on 02/25/2024  2.  Social/family history: - He lives at home with his wife [name].  He had a stroke in 2022 which has caused  short-term memory loss, loss of right-sided peripheral vision and balance problems.  He currently walks with a cane and sometimes walker.  He is also on continuous oxygen  since hospitalization in December 2024.  Most of the day he spends time sitting in the chair.  He can wash dishes but does require some help with some of the ADLs.  Quit smoking in 2017. - Mother and sister had ovarian cancer.  Half brother had lung cancer.   Plan:  1.  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 - Consolidation durvalumab  started on 02/25/2024.  So far he is tolerating every 2-week dose very well. - Epigastric pain has improved after he started taking Maalox. - We reviewed labs today: Normal LFTs and creatinine.  CBC grossly normal.  TSH is 1.007. - He will proceed with Imfinzi  today.  In 2 weeks, we will switch him to 1500 mg every 4-week dose.  RTC 6 weeks for follow-up with repeat CT chest with contrast.  If no progression, he will continue consolidation durvalumab  until May 2026 with repeat CT scans every 3 months.  2.  ICI induced skin rash: - 2 days after first dose of FSE, he developed rash on the left lateral chest wall, groin and upper thighs.  He was treated with prednisone  30 mg daily for 5 days.  He is using steroid cream.  His rash improved since then.  3.  Peripheral neuropathy: - Baseline numbness in the fingertips of hands, right more than left from stroke is stable.  4.  Hypertension/A-fib: - Continue Toprol -XL daily.  Continue to hold Cardizem .  Blood pressure  is 119/69.  5.  Hypomagnesemia: - Continue magnesium  once daily.  Magnesium  is normal at 1.8.   Orders Placed This Encounter  Procedures   CT CHEST W CONTRAST    Standing Status:   Future    Expected Date:   05/14/2024    Expiration Date:   04/13/2025    If indicated for the ordered procedure, I authorize the administration of contrast media per Radiology  protocol:   Yes    Does the patient have a contrast media/X-ray dye allergy?:   No    Preferred imaging location?:   [facility] R Teague,acting as a scribe for Alean Stands, MD.,have documented all relevant documentation on the behalf of Alean Stands, MD,as directed by  Alean Stands, MD while in the presence of Alean Stands, MD.  I, Alean Stands MD, have reviewed the above documentation for accuracy and completeness, and I agree with the above.       Alean Stands, MD   7/15/202512:41 PM  CHIEF COMPLAINT/PURPOSE OF CONSULT:   Diagnosis: Recurrent stage III adenocarcinoma   Cancer Staging  Primary lung adenocarcinoma (HCC) Staging form: Lung, AJCC 8th Edition - Clinical: Stage IA2 (cT1b, cN0, cM0) - Signed by Sherrod Sherrod, MD on 12/04/2020  Recurrent non-small cell lung cancer (NSCLC) (HCC) Staging form: Lung, AJCC V9 - Clinical stage from 12/03/2023: Stage IA3 (rcT1c, rcN0, rcM0) - Unsigned    Prior Therapy: Left upper lobectomy (2017) and right upper lobectomy (2020)  Current Therapy: Concurrent chemoradiation therapy   HISTORY OF PRESENT ILLNESS:   Oncology History  Primary lung adenocarcinoma (HCC)  10/15/2016 Initial Diagnosis   Primary lung adenocarcinoma (HCC)   12/04/2020 Cancer Staging   Staging form: Lung, AJCC 8th Edition - Clinical: Stage IA2 (cT1b, cN0, cM0) - Signed by Sherrod Sherrod, MD on 12/04/2020   Recurrent non-small cell lung cancer (NSCLC) (HCC)  12/03/2023 Initial Diagnosis   Recurrent non-small cell lung cancer (NSCLC) (HCC)   12/23/2023 - 01/27/2024 Chemotherapy   Patient is on Treatment Plan : LUNG Carboplatin  + Paclitaxel  + XRT q7d     02/25/2024 -  Chemotherapy   Patient is on Treatment Plan : LUNG Durvalumab  (10) q14d         Mekel is a 73 y.o. male presenting to clinic today for evaluation of recurrent adenocarcinoma of right lung, non small cell type at the request of Dr.  Sherrod.  Pradyun was first diagnosed in 2017 with Stage IA (T1b, N0, M0) non-small cell lung cancer, well-differentiated adenocarcinoma presented with left upper lobe pulmonary nodule. He then underwent left VATS and left upper lobectomy with mediastinal lymph node dissection under Dr. Kerrin on 08/01/2016. Srihith has since been under surveillance for left lung adenocarcinoma.  He was diagnosed with stage Ia non-small cell lung cancer in March 2020 and underwent right VATS with wedge resection of right upper lobe nodule and bleb resection under Dr. Kerrin on 12/10/2018. He was under observation until a CT scan on 07/08/23 found an increasing mass like soft tissue along the suture margin in the central right upper lobe. PET on 08/19/23 found: recurrent bronchogenic carcinoma in the medial right upper lobe along the resection margin with faint 0.6 cm nodule in the superior segment of the right lower lobe too small for PET resolution.   Lindwood then had a bronchoscopy on 10/24/23 with right paratracheal lymph node biopsy and endobronchial valve replacement in the anterior apical, posterior segments of right upper lobe, and superior  segment of right lower lobe. Pathology of lymph node biopsy showed: adenocarcinoma. Molecular pathology showed a PDL1 score of 5%.   His cancer is now considered Stage III NSCLC adenocarcinoma due to right paratracheal lymph node involvement. Kelvin had discussion of treatment with Dr. Sherrod on 11/26/23 that consisted of weekly mild chemotherapy with adjunct radiation at [facility] with their side effects.   MRI brain done on 12/02/23 showed: No strong evidence of cerebral metastatic disease on this noncontrast exam. 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 Encephalomalacia and mild hemosiderin. No acute intracranial abnormality identified.  Rashad has a history of stroke, with symptoms of fatigue, weakness, vision changes, and hoarseness. He also has pertinent  history of COPD and requires at home oxygen .  Today, he states that he is doing well overall. His appetite level is at 100%. His energy level is at 50%.  INTERVAL HISTORY:   ANTHONI GEERTS is a 73 y.o. male presenting to the clinic today for follow-up of recurrent stage III NSCLC. He was last seen by me on 03/30/24.  Today, he states that he is doing well overall. His appetite level is at 100%. His energy level is at 75%. He is accompanied by his wife.  Cypher reports epigastric pain has improved since his last visit, though pain is not localized to the area and appears along various areas on the abdomen.   His immunotherapy induced rash has improved, particularly on the bilateral forearms. The mild rash is currently along the front of bilateral thighs, and pelvic area. He states the rash is no longer itching. He is no longer taking Prednisone  and could not afford prescribed betamethasone  cream. Erika is using hydrocortisone  ointment and oatmeal moisturizer to improve symptoms.   PAST MEDICAL HISTORY:   Past Medical History: Past Medical History:  Diagnosis Date   A-fib (HCC) 2017   after last lung surgery patient had a history if Afib documented in hospital    AAA (abdominal aortic aneurysm) (HCC)    4.0 cm 09/17/22 US    Acute appendicitis 07/14/2021   Arthritis    back & legs ( knees)   Cancer (HCC)    lung   Complication of anesthesia    agitated upon waking from anesth.    Dyspnea    Dysrhythmia    A. Fib   Emphysema    Emphysema of lung (HCC)    History of kidney stones    Hyperlipidemia    Hypertension    Oxygen  dependent    2L continuous   Peripheral vascular disease (HCC)    Aortic Aneurysm   Pneumonia    Pre-diabetes    Sleep apnea    No OSA, but wears 2L [place] O2 continuous including at night   Stroke [facility]) 2022   Right sided weakness, decreased hearing, balance and memory loss   Tobacco abuse     Surgical History: Past Surgical History:  Procedure Laterality Date    HERNIA REPAIR Bilateral 1999   umbilical   IR IMAGING GUIDED PORT INSERTION  12/09/2023   LAPAROSCOPIC APPENDECTOMY N/A 07/15/2021   Procedure: APPENDECTOMY LAPAROSCOPIC;  Surgeon: Kallie Manuelita BROCKS, MD;  Location: [facility];  Service: General;  Laterality: N/A;   VIDEO ASSISTED THORACOSCOPY Right 12/21/2018   Procedure: VIDEO ASSISTED THORACOSCOPY AND RESECTION OF BLEBS RIGHT LOWER LOBE;  Surgeon: Kerrin Elspeth BROCKS, MD;  Location: [facility] OR;  Service: Thoracic;  Laterality: Right;   VIDEO ASSISTED THORACOSCOPY (VATS)/ LOBECTOMY Left 08/01/2016   Procedure: VIDEO  ASSISTED THORACOSCOPY (VATS)/ LEFT UPPER LOBECTOMY with lymph node sampling and onQ placement;  Surgeon: Elspeth JAYSON Millers, MD;  Location: [facility] OR;  Service: Thoracic;  Laterality: Left;   VIDEO ASSISTED THORACOSCOPY (VATS)/WEDGE RESECTION Right 12/09/2018   Procedure: VIDEO ASSISTED THORACOSCOPY (VATS)/WEDGE RESECTION;  Surgeon: Millers Elspeth JAYSON, MD;  Location: [facility] OR;  Service: Thoracic;  Laterality: Right;   VIDEO BRONCHOSCOPY Bilateral 12/21/2018   Procedure: VIDEO BRONCHOSCOPY;  Surgeon: Millers Elspeth JAYSON, MD;  Location: [facility] OR;  Service: Thoracic;  Laterality: Bilateral;   VIDEO BRONCHOSCOPY WITH ENDOBRONCHIAL NAVIGATION N/A 07/03/2016   Procedure: VIDEO BRONCHOSCOPY WITH ENDOBRONCHIAL NAVIGATION;  Surgeon: Elspeth JAYSON Millers, MD;  Location: [facility] OR;  Service: Thoracic;  Laterality: N/A;   VIDEO BRONCHOSCOPY WITH ENDOBRONCHIAL NAVIGATION N/A 10/24/2023   Procedure: VIDEO BRONCHOSCOPY WITH ENDOBRONCHIAL NAVIGATION;  Surgeon: Millers Elspeth JAYSON, MD;  Location: [facility] OR;  Service: Thoracic;  Laterality: N/A;   VIDEO BRONCHOSCOPY WITH ENDOBRONCHIAL ULTRASOUND N/A 10/24/2023   Procedure: VIDEO BRONCHOSCOPY WITH ENDOBRONCHIAL ULTRASOUND;  Surgeon: Millers Elspeth JAYSON, MD;  Location: [facility] OR;  Service: Thoracic;  Laterality: N/A;   VIDEO BRONCHOSCOPY WITH INSERTION OF INTERBRONCHIAL VALVE (IBV) N/A 12/11/2018   Procedure: VIDEO  BRONCHOSCOPY WITH INSERTION OF INTERBRONCHIAL VALVE (IBV);  Surgeon: Millers Elspeth JAYSON, MD;  Location: [facility] OR;  Service: Thoracic;  Laterality: N/A;   VIDEO BRONCHOSCOPY WITH INSERTION OF INTERBRONCHIAL VALVE (IBV) N/A 02/10/2019   Procedure: VIDEO BRONCHOSCOPY WITH REMOVAL OF INTERBRONCHIAL VALVE (IBV);  Surgeon: Millers Elspeth JAYSON, MD;  Location: [facility] OR;  Service: Thoracic;  Laterality: N/A;    Social History: Social History   Socioeconomic History   Marital status: Married    Spouse name: Not on file   Number of children: 2   Years of education: Not on file   Highest education level: 9th grade  Occupational History   Occupation: Vinyl siding   Occupation: Retired  Tobacco Use   Smoking status: Former    Current packs/day: 0.00    Average packs/day: 1 pack/day for 40.0 years (40.0 ttl pk-yrs)    Types: Cigarettes    Start date: 05/28/1976    Quit date: 05/28/2016    Years since quitting: 7.8   Smokeless tobacco: Never  Vaping Use   Vaping status: Never Used  Substance and Sexual Activity   Alcohol use: Not Currently    Comment: quit- 2016   Drug use: No   Sexual activity: Not Currently  Other Topics Concern   Not on file  Social History Narrative   Married for 20 years,second.Lives with wife. 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   Social Drivers of Corporate investment banker Strain: High Risk (12/10/2023)   Overall Financial Resource Strain (CARDIA)    Difficulty of Paying Living Expenses: Very hard  Food Insecurity: No Food Insecurity (03/02/2024)   Received from [facility]   Hunger Vital Sign    Within the past 12 months, you worried that your food would run out before you got the money to buy more.: Never true    Within the past 12 months, the food you bought just didn't last and you didn't have money to get more.: Never true  Transportation Needs: No Transportation Needs (03/02/2024)   Received from [facility] - Transportation    Lack of  Transportation (Medical): No    Lack of Transportation (Non-Medical): No  Physical Activity: Insufficiently Active (07/11/2023)   Exercise Vital Sign    Days of Exercise per Week: 3 days  Minutes of Exercise per Session: 10 min  Stress: Patient Declined (07/11/2023)   [employer] of Occupational Health - Occupational Stress Questionnaire    Feeling of Stress : Patient declined  Social Connections: Unknown (07/11/2023)   Social Connection and Isolation Panel    Frequency of Communication with Friends and Family: Twice a week    Frequency of Social Gatherings with Friends and Family: Once a week    Attends Religious Services: Patient declined    Database administrator or Organizations: No    Attends Engineer, structural: Not on file    Marital Status: Married  Intimate Partner Violence: Patient Unable To Answer (09/17/2023)   Humiliation, Afraid, Rape, and Kick questionnaire    Fear of Current or Ex-Partner: Patient unable to answer    Emotionally Abused: Patient unable to answer    Physically Abused: Patient unable to answer    Sexually Abused: Patient unable to answer    Family History: Family History  Problem Relation Age of Onset   Cancer Mother    Cancer Sister    Aneurysm Sister    Kidney disease Paternal Aunt    Asthma Brother    Cancer - Lung Brother    Cancer Brother    Arthritis Brother     Current Medications:  Current Outpatient Medications:    acetaminophen  (TYLENOL ) 325 MG tablet, Take 2 tablets (650 mg total) by mouth every 6 (six) hours as needed for mild pain (or Fever >/= 101)., Disp: , Rfl:    ALPRAZolam  (XANAX ) 0.5 MG tablet, Take 0.5 mg by mouth daily., Disp: , Rfl:    aluminum -magnesium  hydroxide 200-200 MG/5ML suspension, Take 15 mLs by mouth every 6 (six) hours as needed for indigestion (Mix 1:1 with lidocaine  and swish and spit)., Disp: 355 mL, Rfl: 2   betamethasone  dipropionate 0.05 % cream, Apply topically 2 (two) times daily.,  Disp: 60 g, Rfl: 2   budesonide -glycopyrrolate -formoterol  (BREZTRI  AEROSPHERE) 160-9-4.8 MCG/ACT AERO inhaler, Inhale 2 puffs into the lungs in the morning and at bedtime., Disp: 33 g, Rfl: 4   cetirizine  (ZYRTEC ) 10 MG tablet, Take 10 mg by mouth daily., Disp: , Rfl:    Cholecalciferol  (VITAMIN D3) 125 MCG (5000 UT) CAPS, Take 1 capsule (5,000 Units total) by mouth daily., Disp: 30 capsule, Rfl: 0   diltiazem  (CARDIZEM  CD) 180 MG 24 hr capsule, Take 1 capsule (180 mg total) by mouth daily., Disp: 90 capsule, Rfl: 3   fenofibrate  (TRICOR ) 145 MG tablet, Take 1 tablet (145 mg total) by mouth daily., Disp: 90 tablet, Rfl: 1   finasteride  (PROSCAR ) 5 MG tablet, Take 1 tablet by mouth once daily, Disp: 30 tablet, Rfl: 0   guaiFENesin  (MUCINEX ) 600 MG 12 hr tablet, Take by mouth 2 (two) times daily., Disp: , Rfl:    hydrOXYzine  (ATARAX ) 25 MG tablet, Take 1 tablet (25 mg total) by mouth every 8 (eight) hours as needed for itching., Disp: 30 tablet, Rfl: 0   ipratropium-albuterol  (DUONEB) 0.5-2.5 (3) MG/3ML SOLN, USE 1 AMPULE IN NEBULIZER 4 TIMES DAILY AND AS NEEDED FOR SHORTNESS OF BREATH AND FOR WHEEZING, Disp: 450 mL, Rfl: 0   lidocaine  (XYLOCAINE ) 2 % solution, Use as directed 15 mLs in the mouth or throat every 6 (six) hours as needed for mouth pain (Mix 1:1 with Maalox and swish and spit)., Disp: 100 mL, Rfl: 2   magnesium  oxide (MAG-OX) 400 (240 Mg) MG tablet, Take 1 tablet (400 mg total) by mouth 2 (two) times  daily., Disp: 60 tablet, Rfl: 3   meclizine  (ANTIVERT ) 25 MG tablet, Take 1 tablet (25 mg total) by mouth 3 (three) times daily as needed for dizziness., Disp: 30 tablet, Rfl: 0   metoprolol  succinate (TOPROL  XL) 50 MG 24 hr tablet, Take 1 tablet (50 mg total) by mouth daily. Take with or immediately following a meal., Disp: 90 tablet, Rfl: 1   Omega-3 Fatty Acids (FISH OIL ULTRA PO), Take 1,200 mg by mouth in the morning and at bedtime., Disp: , Rfl:    OXYGEN , Inhale 2 L into the lungs  continuous. Hx of lung ca, copd, and emphysema, Disp: , Rfl:    Palonosetron  HCl (ALOXI  IV), Inject into the vein., Disp: , Rfl:    pantoprazole  (PROTONIX ) 40 MG tablet, Take 1 tablet (40 mg total) by mouth daily., Disp: 30 tablet, Rfl: 1   polyethylene glycol (MIRALAX  / GLYCOLAX ) 17 g packet, Take 17 g by mouth daily as needed for mild constipation., Disp: , Rfl:    rosuvastatin  (CRESTOR ) 40 MG tablet, Take 1 tablet (40 mg total) by mouth daily., Disp: 90 tablet, Rfl: 3   tamsulosin  (FLOMAX ) 0.4 MG CAPS capsule, Take 2 capsules (0.8 mg total) by mouth daily., Disp: 180 capsule, Rfl: 3   warfarin (COUMADIN ) 5 MG tablet, Take 0.5-1 tablets (2.5-5 mg total) by mouth See admin instructions. Take 2.5 mg by mouth Tuesday and Friday and take 5 mg on Monday, Wednesday, Thursday, Saturday and Sunday, Disp: 90 tablet, Rfl: 1 No current facility-administered medications for this visit.  Facility-Administered Medications Ordered in Other Visits:    0.9 %  sodium chloride  infusion, , Intravenous, Continuous, Rogers Hai, MD, Stopped at 04/13/24 1145   sodium chloride  flush (NS) 0.9 % injection 10 mL, 10 mL, Intracatheter, PRN, Seraj Dunnam, MD, 10 mL at 04/13/24 1142   Allergies: No Known Allergies  REVIEW OF SYSTEMS:   Review of Systems  Constitutional:  Negative for chills, fatigue and fever.  HENT:   Negative for lump/mass, mouth sores, nosebleeds, sore throat and trouble swallowing.   Eyes:  Negative for eye problems.  Respiratory:  Positive for shortness of breath. Negative for cough.   Cardiovascular:  Negative for chest pain, leg swelling and palpitations.  Gastrointestinal:  Negative for abdominal pain, constipation, diarrhea, nausea and vomiting.  Genitourinary:  Negative for bladder incontinence, difficulty urinating, dysuria, frequency, hematuria and nocturia.   Musculoskeletal:  Negative for arthralgias, back pain, flank pain, myalgias and neck pain.       +generalized  pain throughout the body, 7/10 severity  Skin:  Positive for rash. Negative for itching.  Neurological:  Negative for dizziness, headaches and numbness.       +tingling in fingers  Hematological:  Does not bruise/bleed easily.  Psychiatric/Behavioral:  Positive for sleep disturbance. Negative for depression and suicidal ideas. The patient is not nervous/anxious.   All other systems reviewed and are negative.    VITALS:   Weight 215 lb 2.7 oz (97.6 kg).  Wt Readings from Last 3 Encounters:  04/13/24 215 lb 2.7 oz (97.6 kg)  03/30/24 214 lb 15.2 oz (97.5 kg)  03/09/24 215 lb 9.8 oz (97.8 kg)    Body mass index is 30.87 kg/m.  Performance status (ECOG): 1 - Symptomatic but completely ambulatory  PHYSICAL EXAM:   Physical Exam Vitals and nursing note reviewed. Exam conducted with a chaperone present.  Constitutional:      Appearance: Normal appearance.  Cardiovascular:     Rate and Rhythm: Normal  rate and regular rhythm.     Pulses: Normal pulses.     Heart sounds: Normal heart sounds.  Pulmonary:     Effort: Pulmonary effort is normal.     Breath sounds: Normal breath sounds.  Abdominal:     Palpations: Abdomen is soft. There is no hepatomegaly, splenomegaly or mass.     Tenderness: There is no abdominal tenderness.  Musculoskeletal:     Right lower leg: No edema.     Left lower leg: No edema.  Lymphadenopathy:     Cervical: No cervical adenopathy.     Right cervical: No superficial, deep or posterior cervical adenopathy.    Left cervical: No superficial, deep or posterior cervical adenopathy.     Upper Body:     Right upper body: No supraclavicular or axillary adenopathy.     Left upper body: No supraclavicular or axillary adenopathy.  Neurological:     General: No focal deficit present.     Mental Status: He is alert and oriented to person, place, and time.  Psychiatric:        Mood and Affect: Mood normal.        Behavior: Behavior normal.     LABS:   CBC     Component Value Date/Time   WBC 4.8 04/13/2024 0843   RBC 4.13 (L) 04/13/2024 0843   HGB 11.0 (L) 04/13/2024 0843   HGB 13.6 11/26/2023 1040   HGB 14.8 07/15/2023 1142   HGB 13.5 04/09/2017 1249   HCT 36.7 (L) 04/13/2024 0843   HCT 48.2 07/15/2023 1142   HCT 42.4 04/09/2017 1249   PLT 166 04/13/2024 0843   PLT 197 11/26/2023 1040   PLT 205 07/15/2023 1142   MCV 88.9 04/13/2024 0843   MCV 86 07/15/2023 1142   MCV 84.8 04/09/2017 1249   MCH 26.6 04/13/2024 0843   MCHC 30.0 04/13/2024 0843   RDW 14.5 04/13/2024 0843   RDW 13.1 07/15/2023 1142   RDW 15.4 (H) 04/09/2017 1249   LYMPHSABS 0.4 (L) 04/13/2024 0843   LYMPHSABS 1.1 07/31/2021 1026   LYMPHSABS 1.0 04/09/2017 1249   MONOABS 0.6 04/13/2024 0843   MONOABS 1.9 (H) 04/09/2017 1249   EOSABS 0.1 04/13/2024 0843   EOSABS 0.1 07/31/2021 1026   BASOSABS 0.0 04/13/2024 0843   BASOSABS 0.0 07/31/2021 1026   BASOSABS 0.0 04/09/2017 1249    CMP    Component Value Date/Time   NA 138 04/13/2024 0843   NA 140 07/15/2023 1142   NA 136 04/09/2017 1249   K 3.8 04/13/2024 0843   K 4.6 04/09/2017 1249   CL 100 04/13/2024 0843   CO2 27 04/13/2024 0843   CO2 27 04/09/2017 1249   GLUCOSE 135 (H) 04/13/2024 0843   GLUCOSE 103 04/09/2017 1249   BUN 20 04/13/2024 0843   BUN 13 07/15/2023 1142   BUN 23.9 04/09/2017 1249   CREATININE 1.07 04/13/2024 0843   CREATININE 1.10 11/26/2023 1040   CREATININE 1.21 (H) 05/08/2020 1051   CREATININE 1.4 (H) 04/09/2017 1249   CALCIUM  9.2 04/13/2024 0843   CALCIUM  9.6 04/09/2017 1249   PROT 6.5 04/13/2024 0843   PROT 6.9 03/04/2023 1515   PROT 7.8 04/09/2017 1249   ALBUMIN  3.5 04/13/2024 0843   ALBUMIN  4.6 03/04/2023 1515   ALBUMIN  3.6 04/09/2017 1249   AST 24 04/13/2024 0843   AST 20 11/26/2023 1040   AST 13 04/09/2017 1249   ALT 19 04/13/2024 0843   ALT 19 11/26/2023 1040   ALT  9 04/09/2017 1249   ALKPHOS 30 (L) 04/13/2024 0843   ALKPHOS 58 04/09/2017 1249   BILITOT 0.6 04/13/2024  0843   BILITOT 0.7 11/26/2023 1040   BILITOT 1.01 04/09/2017 1249   GFRNONAA >60 04/13/2024 0843   GFRNONAA >60 11/26/2023 1040   GFRNONAA 60 05/08/2020 1051   GFRAA >60 06/02/2020 1239   GFRAA 70 05/08/2020 1051     No results found for: CEA1, CEA / No results found for: CEA1, CEA Lab Results  Component Value Date   PSA1 3.5 07/05/2021   No results found for: CAN199 No results found for: CAN125  No results found for: TOTALPROTELP, ALBUMINELP, A1GS, A2GS, BETS, BETA2SER, GAMS, MSPIKE, SPEI No results found for: TIBC, FERRITIN, IRONPCTSAT No results found for: LDH   STUDIES:   No results found.

## 2024-04-13 NOTE — Patient Instructions (Signed)
 [facility]  - A DEPT OF [facility]  Discharge Instructions: Thank you for choosing [facility] to provide your oncology and hematology care.  If you have a lab appointment with the Cancer Center - please note that after April 8th, 2024, all labs will be drawn in the cancer center.  You do not have to check in or register with the main entrance as you have in the past but will complete your check-in in the cancer center.  Wear comfortable clothing and clothing appropriate for easy access to any Portacath or PICC line.   We strive to give you quality time with your provider. You may need to reschedule your appointment if you arrive late (15 or more minutes).  Arriving late affects you and other patients whose appointments are after yours.  Also, if you miss three or more appointments without notifying the office, you may be dismissed from the clinic at the provider's discretion.      For prescription refill requests, have your pharmacy contact our office and allow 72 hours for refills to be completed.    Today you received the following chemotherapy and/or immunotherapy agents Durvalumab    To help prevent nausea and vomiting after your treatment, we encourage you to take your nausea medication as directed.  BELOW ARE SYMPTOMS THAT SHOULD BE REPORTED IMMEDIATELY: *FEVER GREATER THAN 100.4 F (38 C) OR HIGHER *CHILLS OR SWEATING *NAUSEA AND VOMITING THAT IS NOT CONTROLLED WITH YOUR NAUSEA MEDICATION *UNUSUAL SHORTNESS OF BREATH *UNUSUAL BRUISING OR BLEEDING *URINARY PROBLEMS (pain or burning when urinating, or frequent urination) *BOWEL PROBLEMS (unusual diarrhea, constipation, pain near the anus) TENDERNESS IN MOUTH AND THROAT WITH OR WITHOUT PRESENCE OF ULCERS (sore throat, sores in mouth, or a toothache) UNUSUAL RASH, SWELLING OR PAIN  UNUSUAL VAGINAL DISCHARGE OR ITCHING   Items with * indicate a potential emergency and should be followed up  as soon as possible or go to the Emergency Department if any problems should occur.  Please show the CHEMOTHERAPY ALERT CARD or IMMUNOTHERAPY ALERT CARD at check-in to the Emergency Department and triage nurse.  Should you have questions after your visit or need to cancel or reschedule your appointment, please contact [facility]  - A DEPT OF [facility] [phone]  and follow the prompts.  Office hours are 8:00 a.m. to 4:30 p.m. Monday - Friday. Please note that voicemails left after 4:00 p.m. may not be returned until the following business day.  We are closed weekends and major holidays. You have access to a nurse at all times for urgent questions. Please call the main number to the clinic [phone] and follow the prompts.  For any non-urgent questions, you may also contact your provider using MyChart. We now offer e-Visits for anyone 39 and older to request care online for non-urgent symptoms. For details visit [email or web address].   Also download the MyChart app! Go to the app store, search MyChart, open the app, select [place], and log in with your MyChart username and password.

## 2024-04-13 NOTE — Patient Instructions (Signed)

## 2024-04-13 NOTE — Progress Notes (Signed)
Treatment given per orders. Patient tolerated it well without problems. Vitals stable and discharged home from clinic via wheelchair Follow up as scheduled.  

## 2024-04-14 ENCOUNTER — Encounter: Payer: Self-pay | Admitting: Internal Medicine

## 2024-04-15 ENCOUNTER — Other Ambulatory Visit: Payer: Self-pay

## 2024-04-15 DIAGNOSIS — J449 Chronic obstructive pulmonary disease, unspecified: Secondary | ICD-10-CM

## 2024-04-15 MED ORDER — IPRATROPIUM-ALBUTEROL 0.5-2.5 (3) MG/3ML IN SOLN: RESPIRATORY_TRACT | 0 refills | Status: DC

## 2024-04-20 ENCOUNTER — Ambulatory Visit

## 2024-04-27 ENCOUNTER — Inpatient Hospital Stay (HOSPITAL_BASED_OUTPATIENT_CLINIC_OR_DEPARTMENT_OTHER): Admitting: Hematology

## 2024-04-27 ENCOUNTER — Inpatient Hospital Stay

## 2024-04-27 VITALS — BP 133/72 | HR 79 | Temp 97.7°F | Resp 16 | Wt 213.4 lb

## 2024-04-27 VITALS — BP 105/75 | HR 74 | Temp 97.0°F | Resp 18

## 2024-04-27 DIAGNOSIS — C3431 Malignant neoplasm of lower lobe, right bronchus or lung: Secondary | ICD-10-CM | POA: Diagnosis not present

## 2024-04-27 DIAGNOSIS — C3490 Malignant neoplasm of unspecified part of unspecified bronchus or lung: Secondary | ICD-10-CM

## 2024-04-27 DIAGNOSIS — Z5112 Encounter for antineoplastic immunotherapy: Secondary | ICD-10-CM | POA: Diagnosis not present

## 2024-04-27 DIAGNOSIS — G629 Polyneuropathy, unspecified: Secondary | ICD-10-CM | POA: Diagnosis not present

## 2024-04-27 DIAGNOSIS — R21 Rash and other nonspecific skin eruption: Secondary | ICD-10-CM | POA: Diagnosis not present

## 2024-04-27 DIAGNOSIS — R1013 Epigastric pain: Secondary | ICD-10-CM | POA: Diagnosis not present

## 2024-04-27 DIAGNOSIS — Z789 Other specified health status: Secondary | ICD-10-CM

## 2024-04-27 DIAGNOSIS — R599 Enlarged lymph nodes, unspecified: Secondary | ICD-10-CM | POA: Diagnosis not present

## 2024-04-27 LAB — CBC WITH DIFFERENTIAL/PLATELET
Abs Immature Granulocytes: 0.02 K/uL (ref 0.00–0.07)
Basophils Absolute: 0 K/uL (ref 0.0–0.1)
Basophils Relative: 0 %
Eosinophils Absolute: 0.1 K/uL (ref 0.0–0.5)
Eosinophils Relative: 2 %
HCT: 38.7 % — ABNORMAL LOW (ref 39.0–52.0)
Hemoglobin: 12.1 g/dL — ABNORMAL LOW (ref 13.0–17.0)
Immature Granulocytes: 0 %
Lymphocytes Relative: 8 %
Lymphs Abs: 0.4 K/uL — ABNORMAL LOW (ref 0.7–4.0)
MCH: 27.4 pg (ref 26.0–34.0)
MCHC: 31.3 g/dL (ref 30.0–36.0)
MCV: 87.8 fL (ref 80.0–100.0)
Monocytes Absolute: 0.5 K/uL (ref 0.1–1.0)
Monocytes Relative: 9 %
Neutro Abs: 4.4 K/uL (ref 1.7–7.7)
Neutrophils Relative %: 81 %
Platelets: 163 K/uL (ref 150–400)
RBC: 4.41 MIL/uL (ref 4.22–5.81)
RDW: 14.6 % (ref 11.5–15.5)
WBC: 5.5 K/uL (ref 4.0–10.5)
nRBC: 0 % (ref 0.0–0.2)

## 2024-04-27 LAB — COMPREHENSIVE METABOLIC PANEL WITH GFR
ALT: 20 U/L (ref 0–44)
AST: 25 U/L (ref 15–41)
Albumin: 3.7 g/dL (ref 3.5–5.0)
Alkaline Phosphatase: 36 U/L — ABNORMAL LOW (ref 38–126)
Anion gap: 10 (ref 5–15)
BUN: 25 mg/dL — ABNORMAL HIGH (ref 8–23)
CO2: 26 mmol/L (ref 22–32)
Calcium: 8.9 mg/dL (ref 8.9–10.3)
Chloride: 101 mmol/L (ref 98–111)
Creatinine, Ser: 1.27 mg/dL — ABNORMAL HIGH (ref 0.61–1.24)
GFR, Estimated: 59 mL/min — ABNORMAL LOW (ref >60)
Glucose, Bld: 172 mg/dL — ABNORMAL HIGH (ref 70–99)
Potassium: 3.9 mmol/L (ref 3.5–5.1)
Sodium: 137 mmol/L (ref 135–145)
Total Bilirubin: 0.9 mg/dL (ref 0.0–1.2)
Total Protein: 6.7 g/dL (ref 6.5–8.1)

## 2024-04-27 LAB — MAGNESIUM: Magnesium: 1.9 mg/dL (ref 1.7–2.4)

## 2024-04-27 MED ORDER — LIDOCAINE-PRILOCAINE 2.5-2.5 % EX CREA: 1.0000 | TOPICAL_CREAM | CUTANEOUS | 0 refills | Status: DC | PRN

## 2024-04-27 MED ORDER — SODIUM CHLORIDE 0.9% FLUSH
10.0000 mL | Freq: Once | INTRAVENOUS | Status: AC
  Administered 2024-04-27: 10 mL via INTRAVENOUS

## 2024-04-27 MED ORDER — LIDOCAINE-PRILOCAINE 2.5-2.5 % EX CREA: TOPICAL_CREAM | CUTANEOUS | Status: DC

## 2024-04-27 MED ORDER — HEPARIN SOD (PORK) LOCK FLUSH 100 UNIT/ML IV SOLN
500.0000 [IU] | Freq: Once | INTRAVENOUS | Status: AC | PRN
Start: 2024-04-27 — End: 2024-04-27
  Administered 2024-04-27: 500 [IU]

## 2024-04-27 MED ORDER — SODIUM CHLORIDE 0.9 % IV SOLN
INTRAVENOUS | Status: DC
Start: 2024-04-27 — End: 2024-04-27

## 2024-04-27 MED ORDER — SODIUM CHLORIDE 0.9% FLUSH
10.0000 mL | INTRAVENOUS | Status: DC | PRN
  Administered 2024-04-27: 10 mL

## 2024-04-27 MED ORDER — SODIUM CHLORIDE 0.9 % IV SOLN
1500.0000 mg | Freq: Once | INTRAVENOUS | Status: AC
  Administered 2024-04-27: 1500 mg via INTRAVENOUS
  Filled 2024-04-27: qty 30

## 2024-04-27 MED ORDER — SODIUM CHLORIDE 0.9 % IV SOLN: INTRAVENOUS | Status: DC

## 2024-04-27 NOTE — Progress Notes (Signed)
 Pt now on Imfinzi  1500mg  q28 days per Dr. Rogers. Tx plan name updated to reflect new regimen.   Aneisha Skyles, PharmD, MBA

## 2024-04-27 NOTE — Progress Notes (Signed)
 Patient okay for treatment today per Dr. Rogers with dose of 1500mg  of Imfinzi , with additional 500mL of normal saline. Patient tolerated therapy with no complaints voiced. Side effects with management reviewed with understanding verbalized. Port site clean and dry with no bruising or swelling noted at site. Good blood return noted before and after administration of therapy. Band aid applied. Patient left in satisfactory condition with VSS and no s/s of distress noted.

## 2024-04-27 NOTE — Patient Instructions (Signed)
 [facility] [place] - A DEPT OF [facility] [place]  Discharge Instructions: Thank you for choosing [facility] to provide your oncology and hematology care.  If you have a lab appointment with the Cancer Center - please note that after April 8th, 2024, all labs will be drawn in the cancer center.  You do not have to check in or register with the main entrance as you have in the past but will complete your check-in in the cancer center.  Wear comfortable clothing and clothing appropriate for easy access to any Portacath or PICC line.   We strive to give you quality time with your provider. You may need to reschedule your appointment if you arrive late (15 or more minutes).  Arriving late affects you and other patients whose appointments are after yours.  Also, if you miss three or more appointments without notifying the office, you may be dismissed from the clinic at the provider's discretion.      For prescription refill requests, have your pharmacy contact our office and allow 72 hours for refills to be completed.    Today you received the following chemotherapy and/or immunotherapy agents Imfinzi  and normal saline, return as scheduled.   To help prevent nausea and vomiting after your treatment, we encourage you to take your nausea medication as directed.  BELOW ARE SYMPTOMS THAT SHOULD BE REPORTED IMMEDIATELY: *FEVER GREATER THAN 100.4 F (38 C) OR HIGHER *CHILLS OR SWEATING *NAUSEA AND VOMITING THAT IS NOT CONTROLLED WITH YOUR NAUSEA MEDICATION *UNUSUAL SHORTNESS OF BREATH *UNUSUAL BRUISING OR BLEEDING *URINARY PROBLEMS (pain or burning when urinating, or frequent urination) *BOWEL PROBLEMS (unusual diarrhea, constipation, pain near the anus) TENDERNESS IN MOUTH AND THROAT WITH OR WITHOUT PRESENCE OF ULCERS (sore throat, sores in mouth, or a toothache) UNUSUAL RASH, SWELLING OR PAIN  UNUSUAL VAGINAL DISCHARGE OR ITCHING   Items with * indicate a potential  emergency and should be followed up as soon as possible or go to the Emergency Department if any problems should occur.  Please show the CHEMOTHERAPY ALERT CARD or IMMUNOTHERAPY ALERT CARD at check-in to the Emergency Department and triage nurse.  Should you have questions after your visit or need to cancel or reschedule your appointment, please contact [facility] [place] - A DEPT OF [facility] [phone]  and follow the prompts.  Office hours are 8:00 a.m. to 4:30 p.m. Monday - Friday. Please note that voicemails left after 4:00 p.m. may not be returned until the following business day.  We are closed weekends and major holidays. You have access to a nurse at all times for urgent questions. Please call the main number to the clinic [phone] and follow the prompts.  For any non-urgent questions, you may also contact your provider using MyChart. We now offer e-Visits for anyone 74 and older to request care online for non-urgent symptoms. For details visit [email or web address].   Also download the MyChart app! Go to the app store, search MyChart, open the app, select [place], and log in with your MyChart username and password.

## 2024-04-27 NOTE — Progress Notes (Signed)
 [facility] [address], [place] [address]   Clinic Day:  04/27/2024  Referring physician: Tobie Suzzane POUR, MD  Patient Care Team: Randall Suzzane POUR, MD as PCP - General (Internal Medicine) Randall Hai, MD as Medical Oncologist (Medical Oncology) Randall Joesph SQUIBB, RN as Oncology Nurse Navigator (Medical Oncology)   ASSESSMENT & PLAN:   Assessment:  1.  Recurrent adenocarcinoma of the lung: - Stage Ia (T1b N0 M0) adenocarcinoma of the left upper lobe, s/p left VATS, left upper lobectomy with mediastinal lymph node dissection by Dr. Kerrin on 08/01/2016 - Stage Ia moderately differentiated adenocarcinoma, 0.7 cm of the right upper lobe, s/p right VATS with wedge resection of right upper lobe nodule and bleb resection by Dr. Kerrin on 12/09/2018 - PET scan (08/19/2023): Dysregulation artifact degrades image quality.  Recurrent bronchogenic carcinoma in the medial right upper lobe along the resection margin with no evidence of distant metastatic disease.  Faint 6 mm nodule in the superior segment right lower lobe too small for PET resolution.  Focal hypermetabolism in the central prostate. - CT super D chest (10/21/2023): Solid 2.6 cm central right upper lobe pulmonary nodule along the wedge resection suture line.  Solid 1.3 cm and 0.7 cm peripheral right lower lobe lung nodules. - MRI brain (12/02/2023): Without IV contrast with no strong evidence of cerebral metastatic disease. - Right paratracheal lymph node FNA (10/24/2023): Adenocarcinoma - Foundation 1 CDX: Insufficient tissue - PD-L1 22 C3 (TPS): 5% - Foundation 1 blood NGS: TMB-low, MSI-high not detected, no other targetable mutations - Chemoradiation therapy with weekly carboplatin  and paclitaxel  started on 12/23/2023, completed on 02/03/2024 - Consolidation durvalumab  started on 02/25/2024  2.  Social/family history: - He lives at home with his wife Randall Sullivan.  He had a stroke in 2022 which has caused  short-term memory loss, loss of right-sided peripheral vision and balance problems.  He currently walks with a cane and sometimes walker.  He is also on continuous oxygen  since hospitalization in December 2024.  Most of the day he spends time sitting in the chair.  He can wash dishes but does require some help with some of the ADLs.  Quit smoking in 2017. - Mother and sister had ovarian cancer.  Half brother had lung cancer.   Plan:  1.  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 - He is tolerating durvalumab  every 2 weeks reasonably well.  No immunotherapy related side effects noted. - Epigastric pain improved after starting Maalox. - Reviewed labs today: Creatinine elevated at 1.27.  LFTs are normal.  CBC grossly normal.  TSH is 1.007. - As he is tolerating durvalumab  very well, I will switch him to 1500 mg every 4 weeks. - He will receive 100 mL normal saline today due to elevated creatinine. - RTC 4 weeks for follow-up.  Will repeat CT chest with contrast.  He will continue CT chest every 3 months during consolidation phase of durvalumab  which she will finish in May 2026.  2.  ICI induced skin rash: - He developed a skin rash on the left lateral chest wall and groin and upper thighs which was treated with 5 days of prednisone  30 mg daily.  Rash has improved.  No recurrence reported.  3.  Peripheral neuropathy: - Baseline numbness in the fingertips of hands, right more than left from stroke is stable.  4.  Hypertension/A-fib: - Continue Toprol -XL daily.  Continue to hold Cardizem .  Blood pressure is 133/72.  5.  Hypomagnesemia: - Continue magnesium  once daily.  Magnesium  is normal.   Orders Placed This Encounter  Procedures   Magnesium     Standing Status:   Future    Expected Date:   05/25/2024    Expiration Date:   05/25/2025   CBC with Differential    Standing Status:   Future    Expected Date:   05/25/2024     Expiration Date:   05/25/2025   Comprehensive metabolic panel    Standing Status:   Future    Expected Date:   05/25/2024    Expiration Date:   05/25/2025   T4    Standing Status:   Future    Expected Date:   05/25/2024    Expiration Date:   05/25/2025   TSH    Standing Status:   Future    Expected Date:   05/25/2024    Expiration Date:   05/25/2025      Randall Sullivan,acting as a scribe for Randall Stands, MD.,have documented all relevant documentation on the behalf of Randall Stands, MD,as directed by  Randall Stands, MD while in the presence of Randall Stands, MD.  I, Randall Stands MD, have reviewed the above documentation for accuracy and completeness, and I agree with the above.       Randall Stands, MD   7/29/20253:30 PM  CHIEF COMPLAINT/PURPOSE OF CONSULT:   Diagnosis: Recurrent stage III adenocarcinoma   Cancer Staging  Primary lung adenocarcinoma (HCC) Staging form: Lung, AJCC 8th Edition - Clinical: Stage IA2 (cT1b, cN0, cM0) - Signed by Randall Sherrod, MD on 12/04/2020  Recurrent non-small cell lung cancer (NSCLC) (HCC) Staging form: Lung, AJCC V9 - Clinical stage from 12/03/2023: Stage IA3 (rcT1c, rcN0, rcM0) - Unsigned    Prior Therapy: Left upper lobectomy (2017) and right upper lobectomy (2020)  Current Therapy: Concurrent chemoradiation therapy   HISTORY OF PRESENT ILLNESS:   Oncology History  Primary lung adenocarcinoma (HCC)  10/15/2016 Initial Diagnosis   Primary lung adenocarcinoma (HCC)   12/04/2020 Cancer Staging   Staging form: Lung, AJCC 8th Edition - Clinical: Stage IA2 (cT1b, cN0, cM0) - Signed by Randall Sherrod, MD on 12/04/2020   Recurrent non-small cell lung cancer (NSCLC) (HCC)  12/03/2023 Initial Diagnosis   Recurrent non-small cell lung cancer (NSCLC) (HCC)   12/23/2023 - 01/27/2024 Chemotherapy   Patient is on Treatment Plan : LUNG Carboplatin  + Paclitaxel  + XRT q7d     02/25/2024 -  Chemotherapy    Patient is on Treatment Plan : LUNG Durvalumab  1500mg  q28d         Va is a 73 y.o. male presenting to clinic today for evaluation of recurrent adenocarcinoma of right lung, non small cell type at the request of Dr. Sherrod.  Randall Sullivan was first diagnosed in 2017 with Stage IA (T1b, N0, M0) non-small cell lung cancer, well-differentiated adenocarcinoma presented with left upper lobe pulmonary nodule. He then underwent left VATS and left upper lobectomy with mediastinal lymph node dissection under Dr. Kerrin on 08/01/2016. Randall Sullivan has since been under surveillance for left lung adenocarcinoma.  He was diagnosed with stage Ia non-small cell lung cancer in March 2020 and underwent right VATS with wedge resection of right upper lobe nodule and bleb resection under Dr. Kerrin on 12/10/2018. He was under observation until a CT scan on 07/08/23 found an increasing mass like soft tissue along the suture margin in the central right upper lobe. PET on 08/19/23 found: recurrent bronchogenic  carcinoma in the medial right upper lobe along the resection margin with faint 0.6 cm nodule in the superior segment of the right lower lobe too small for PET resolution.   Randall Sullivan then had a bronchoscopy on 10/24/23 with right paratracheal lymph node biopsy and endobronchial valve replacement in the anterior apical, posterior segments of right upper lobe, and superior segment of right lower lobe. Pathology of lymph node biopsy showed: adenocarcinoma. Molecular pathology showed a PDL1 score of 5%.   His cancer is now considered Stage III NSCLC adenocarcinoma due to right paratracheal lymph node involvement. Randall Sullivan had discussion of treatment with Dr. Sherrod on 11/26/23 that consisted of weekly mild chemotherapy with adjunct radiation at [facility] with their side effects.   MRI brain done on 12/02/23 showed: No strong evidence of cerebral metastatic disease on this noncontrast exam. Expected evolution of large Left PCA territory infarct  since 2022. Encephalomalacia and mild hemosiderin. No acute intracranial abnormality identified.  Randall Sullivan has a history of stroke, with symptoms of fatigue, weakness, vision changes, and hoarseness. He also has pertinent history of COPD and requires at home oxygen .  Today, he states that he is doing well overall. His appetite level is at 100%. His energy level is at 50%.  INTERVAL HISTORY:   Randall Sullivan is a 73 y.o. male presenting to the clinic today for follow-up of recurrent stage III NSCLC. He was last seen by me on 04/13/2024.  Today, he states that he is doing well overall. His appetite level is at 100%. His energy level is at 50%. He is accompanied by his wife. Ayad reports dizziness that does not occur daily. He is taking Maalox as needed for epigastric pain, which has improved.   Erma denies any diarrhea or SOB due to treatment. He had an intermittent dry cough and a mild rash after treatment. His rash is effectively treated with a steroid cream. His appetite is normal.   PAST MEDICAL HISTORY:   Past Medical History: Past Medical History:  Diagnosis Date   A-fib (HCC) 2017   after last lung surgery patient had a history if Afib documented in hospital    AAA (abdominal aortic aneurysm) (HCC)    4.0 cm 09/17/22 US    Acute appendicitis 07/14/2021   Arthritis    back & legs ( knees)   Cancer (HCC)    lung   Complication of anesthesia    agitated upon waking from anesth.    Dyspnea    Dysrhythmia    A. Fib   Emphysema    Emphysema of lung (HCC)    History of kidney stones    Hyperlipidemia    Hypertension    Oxygen  dependent    2L continuous   Peripheral vascular disease (HCC)    Aortic Aneurysm   Pneumonia    Pre-diabetes    Sleep apnea    No OSA, but wears 2L [place] O2 continuous including at night   Stroke [facility]) 2022   Right sided weakness, decreased hearing, balance and memory loss   Tobacco abuse     Surgical History: Past Surgical History:  Procedure Laterality  Date   HERNIA REPAIR Bilateral 1999   umbilical   IR IMAGING GUIDED PORT INSERTION  12/09/2023   LAPAROSCOPIC APPENDECTOMY N/A 07/15/2021   Procedure: APPENDECTOMY LAPAROSCOPIC;  Surgeon: Kallie Manuelita BROCKS, MD;  Location: [facility];  Service: General;  Laterality: N/A;   VIDEO ASSISTED THORACOSCOPY Right 12/21/2018   Procedure: VIDEO ASSISTED THORACOSCOPY AND RESECTION OF BLEBS RIGHT  LOWER LOBE;  Surgeon: Randall Sullivan Elspeth BROCKS, MD;  Location: [facility] OR;  Service: Thoracic;  Laterality: Right;   VIDEO ASSISTED THORACOSCOPY (VATS)/ LOBECTOMY Left 08/01/2016   Procedure: VIDEO ASSISTED THORACOSCOPY (VATS)/ LEFT UPPER LOBECTOMY with lymph node sampling and onQ placement;  Surgeon: Elspeth BROCKS Kerrin, MD;  Location: [facility] OR;  Service: Thoracic;  Laterality: Left;   VIDEO ASSISTED THORACOSCOPY (VATS)/WEDGE RESECTION Right 12/09/2018   Procedure: VIDEO ASSISTED THORACOSCOPY (VATS)/WEDGE RESECTION;  Surgeon: Randall Sullivan Elspeth BROCKS, MD;  Location: [facility] OR;  Service: Thoracic;  Laterality: Right;   VIDEO BRONCHOSCOPY Bilateral 12/21/2018   Procedure: VIDEO BRONCHOSCOPY;  Surgeon: Randall Sullivan Elspeth BROCKS, MD;  Location: [facility] OR;  Service: Thoracic;  Laterality: Bilateral;   VIDEO BRONCHOSCOPY WITH ENDOBRONCHIAL NAVIGATION N/A 07/03/2016   Procedure: VIDEO BRONCHOSCOPY WITH ENDOBRONCHIAL NAVIGATION;  Surgeon: Elspeth BROCKS Kerrin, MD;  Location: [facility] OR;  Service: Thoracic;  Laterality: N/A;   VIDEO BRONCHOSCOPY WITH ENDOBRONCHIAL NAVIGATION N/A 10/24/2023   Procedure: VIDEO BRONCHOSCOPY WITH ENDOBRONCHIAL NAVIGATION;  Surgeon: Randall Sullivan Elspeth BROCKS, MD;  Location: [facility] OR;  Service: Thoracic;  Laterality: N/A;   VIDEO BRONCHOSCOPY WITH ENDOBRONCHIAL ULTRASOUND N/A 10/24/2023   Procedure: VIDEO BRONCHOSCOPY WITH ENDOBRONCHIAL ULTRASOUND;  Surgeon: Randall Sullivan Elspeth BROCKS, MD;  Location: [facility] OR;  Service: Thoracic;  Laterality: N/A;   VIDEO BRONCHOSCOPY WITH INSERTION OF INTERBRONCHIAL VALVE (IBV) N/A 12/11/2018   Procedure: VIDEO  BRONCHOSCOPY WITH INSERTION OF INTERBRONCHIAL VALVE (IBV);  Surgeon: Randall Sullivan Elspeth BROCKS, MD;  Location: [facility] OR;  Service: Thoracic;  Laterality: N/A;   VIDEO BRONCHOSCOPY WITH INSERTION OF INTERBRONCHIAL VALVE (IBV) N/A 02/10/2019   Procedure: VIDEO BRONCHOSCOPY WITH REMOVAL OF INTERBRONCHIAL VALVE (IBV);  Surgeon: Randall Sullivan Elspeth BROCKS, MD;  Location: [facility] OR;  Service: Thoracic;  Laterality: N/A;    Social History: Social History   Socioeconomic History   Marital status: Married    Spouse name: Not on file   Number of children: 2   Years of education: Not on file   Highest education level: 9th grade  Occupational History   Occupation: Vinyl siding   Occupation: Retired  Tobacco Use   Smoking status: Former    Current packs/day: 0.00    Average packs/day: 1 pack/day for 40.0 years (40.0 ttl pk-yrs)    Types: Cigarettes    Start date: 05/28/1976    Quit date: 05/28/2016    Years since quitting: 7.9   Smokeless tobacco: Never  Vaping Use   Vaping status: Never Used  Substance and Sexual Activity   Alcohol use: Not Currently    Comment: quit- 2016   Drug use: No   Sexual activity: Not Currently  Other Topics Concern   Not on file  Social History Narrative   Married for 20 years,second.Lives with wife. 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   Social Drivers of Corporate investment banker Strain: High Risk (12/10/2023)   Overall Financial Resource Strain (CARDIA)    Difficulty of Paying Living Expenses: Very hard  Food Insecurity: No Food Insecurity (03/02/2024)   Received from [facility]   Hunger Vital Sign    Within the past 12 months, you worried that your food would run out before you got the money to buy more.: Never true    Within the past 12 months, the food you bought just didn't last and you didn't have money to get more.: Never true  Transportation Needs: No Transportation Needs (03/02/2024)   Received from [facility] - Transportation    Lack of  Transportation (Medical):  No    Lack of Transportation (Non-Medical): No  Physical Activity: Insufficiently Active (07/11/2023)   Exercise Vital Sign    Days of Exercise per Week: 3 days    Minutes of Exercise per Session: 10 min  Stress: Patient Declined (07/11/2023)   [employer] of Occupational Health - Occupational Stress Questionnaire    Feeling of Stress : Patient declined  Social Connections: Unknown (07/11/2023)   Social Connection and Isolation Panel    Frequency of Communication with Friends and Family: Twice a week    Frequency of Social Gatherings with Friends and Family: Once a week    Attends Religious Services: Patient declined    Database administrator or Organizations: No    Attends Engineer, structural: Not on file    Marital Status: Married  Intimate Partner Violence: Patient Unable To Answer (09/17/2023)   Humiliation, Afraid, Rape, and Kick questionnaire    Fear of Current or Ex-Partner: Patient unable to answer    Emotionally Abused: Patient unable to answer    Physically Abused: Patient unable to answer    Sexually Abused: Patient unable to answer    Family History: Family History  Problem Relation Age of Onset   Cancer Mother    Cancer Sister    Aneurysm Sister    Kidney disease Paternal Aunt    Asthma Brother    Cancer - Lung Brother    Cancer Brother    Arthritis Brother     Current Medications:  Current Outpatient Medications:    lidocaine -prilocaine  (EMLA ) cream, Apply 1 Application topically as needed., Disp: 30 g, Rfl: 0   acetaminophen  (TYLENOL ) 325 MG tablet, Take 2 tablets (650 mg total) by mouth every 6 (six) hours as needed for mild pain (or Fever >/= 101)., Disp: , Rfl:    ALPRAZolam  (XANAX ) 0.5 MG tablet, Take 0.5 mg by mouth daily., Disp: , Rfl:    aluminum -magnesium  hydroxide 200-200 MG/5ML suspension, Take 15 mLs by mouth every 6 (six) hours as needed for indigestion (Mix 1:1 with lidocaine  and swish and spit)., Disp:  355 mL, Rfl: 2   betamethasone  dipropionate 0.05 % cream, Apply topically 2 (two) times daily., Disp: 60 g, Rfl: 2   budesonide -glycopyrrolate -formoterol  (BREZTRI  AEROSPHERE) 160-9-4.8 MCG/ACT AERO inhaler, Inhale 2 puffs into the lungs in the morning and at bedtime., Disp: 33 g, Rfl: 4   cetirizine  (ZYRTEC ) 10 MG tablet, Take 10 mg by mouth daily., Disp: , Rfl:    Cholecalciferol  (VITAMIN D3) 125 MCG (5000 UT) CAPS, Take 1 capsule (5,000 Units total) by mouth daily., Disp: 30 capsule, Rfl: 0   diltiazem  (CARDIZEM  CD) 180 MG 24 hr capsule, Take 1 capsule (180 mg total) by mouth daily., Disp: 90 capsule, Rfl: 3   fenofibrate  (TRICOR ) 145 MG tablet, Take 1 tablet (145 mg total) by mouth daily., Disp: 90 tablet, Rfl: 1   finasteride  (PROSCAR ) 5 MG tablet, Take 1 tablet by mouth once daily, Disp: 30 tablet, Rfl: 0   guaiFENesin  (MUCINEX ) 600 MG 12 hr tablet, Take by mouth 2 (two) times daily., Disp: , Rfl:    hydrOXYzine  (ATARAX ) 25 MG tablet, Take 1 tablet (25 mg total) by mouth every 8 (eight) hours as needed for itching., Disp: 30 tablet, Rfl: 0   ipratropium-albuterol  (DUONEB) 0.5-2.5 (3) MG/3ML SOLN, USE 1 AMPULE IN NEBULIZER 4 TIMES DAILY AND AS NEEDED FOR SHORTNESS OF BREATH AND FOR WHEEZING, Disp: 450 mL, Rfl: 0   lidocaine  (XYLOCAINE ) 2 % solution, Use as directed 15  mLs in the mouth or throat every 6 (six) hours as needed for mouth pain (Mix 1:1 with Maalox and swish and spit)., Disp: 100 mL, Rfl: 2   magnesium  oxide (MAG-OX) 400 (240 Mg) MG tablet, Take 1 tablet (400 mg total) by mouth 2 (two) times daily., Disp: 60 tablet, Rfl: 3   meclizine  (ANTIVERT ) 25 MG tablet, Take 1 tablet (25 mg total) by mouth 3 (three) times daily as needed for dizziness., Disp: 30 tablet, Rfl: 0   metoprolol  succinate (TOPROL  XL) 50 MG 24 hr tablet, Take 1 tablet (50 mg total) by mouth daily. Take with or immediately following a meal., Disp: 90 tablet, Rfl: 1   Omega-3 Fatty Acids (FISH OIL ULTRA PO), Take 1,200 mg  by mouth in the morning and at bedtime., Disp: , Rfl:    OXYGEN , Inhale 2 L into the lungs continuous. Hx of lung ca, copd, and emphysema, Disp: , Rfl:    Palonosetron  HCl (ALOXI  IV), Inject into the vein., Disp: , Rfl:    pantoprazole  (PROTONIX ) 40 MG tablet, Take 1 tablet (40 mg total) by mouth daily., Disp: 30 tablet, Rfl: 1   polyethylene glycol (MIRALAX  / GLYCOLAX ) 17 g packet, Take 17 g by mouth daily as needed for mild constipation., Disp: , Rfl:    rosuvastatin  (CRESTOR ) 40 MG tablet, Take 1 tablet (40 mg total) by mouth daily., Disp: 90 tablet, Rfl: 3   tamsulosin  (FLOMAX ) 0.4 MG CAPS capsule, Take 2 capsules (0.8 mg total) by mouth daily., Disp: 180 capsule, Rfl: 3   warfarin (COUMADIN ) 5 MG tablet, Take 0.5-1 tablets (2.5-5 mg total) by mouth See admin instructions. Take 2.5 mg by mouth Tuesday and Friday and take 5 mg on Monday, Wednesday, Thursday, Saturday and Sunday, Disp: 90 tablet, Rfl: 1 No current facility-administered medications for this visit.  Facility-Administered Medications Ordered in Other Visits:    0.9 %  sodium chloride  infusion, , Intravenous, Continuous, Randall Hai, MD, Stopped at 04/27/24 1222   0.9 %  sodium chloride  infusion, , Intravenous, Continuous, Randall Hai, MD, Stopped at 04/27/24 1138   sodium chloride  flush (NS) 0.9 % injection 10 mL, 10 mL, Intracatheter, PRN, Turki Tapanes, MD, 10 mL at 04/27/24 1224   Allergies: No Known Allergies  REVIEW OF SYSTEMS:   Review of Systems  Constitutional:  Negative for chills, fatigue and fever.  HENT:   Negative for lump/mass, mouth sores, nosebleeds, sore throat and trouble swallowing.   Eyes:  Negative for eye problems.  Respiratory:  Negative for cough and shortness of breath.   Cardiovascular:  Negative for chest pain, leg swelling and palpitations.  Gastrointestinal:  Negative for abdominal pain, constipation, diarrhea, nausea and vomiting.  Genitourinary:  Negative for bladder  incontinence, difficulty urinating, dysuria, frequency, hematuria and nocturia.   Musculoskeletal:  Negative for arthralgias, back pain, flank pain, myalgias and neck pain.  Skin:  Negative for itching and rash.  Neurological:  Positive for dizziness. Negative for headaches and numbness.  Hematological:  Does not bruise/bleed easily.  Psychiatric/Behavioral:  Negative for depression, sleep disturbance and suicidal ideas. The patient is not nervous/anxious.   All other systems reviewed and are negative.    VITALS:   Blood pressure 133/72, pulse 79, temperature 97.7 F (36.5 C), temperature source Oral, resp. rate 16, weight 213 lb 6.5 oz (96.8 kg), SpO2 99%.  Wt Readings from Last 3 Encounters:  04/27/24 213 lb 6.5 oz (96.8 kg)  04/13/24 215 lb 2.7 oz (97.6 kg)  03/30/24 214 lb  15.2 oz (97.5 kg)    Body mass index is 30.62 kg/m.  Performance status (ECOG): 1 - Symptomatic but completely ambulatory  PHYSICAL EXAM:   Physical Exam Vitals and nursing note reviewed. Exam conducted with a chaperone present.  Constitutional:      Appearance: Normal appearance.  Cardiovascular:     Rate and Rhythm: Normal rate and regular rhythm.     Pulses: Normal pulses.     Heart sounds: Normal heart sounds.  Pulmonary:     Effort: Pulmonary effort is normal.     Breath sounds: Normal breath sounds.  Abdominal:     Palpations: Abdomen is soft. There is no hepatomegaly, splenomegaly or mass.     Tenderness: There is no abdominal tenderness.  Musculoskeletal:     Right lower leg: No edema.     Left lower leg: No edema.  Lymphadenopathy:     Cervical: No cervical adenopathy.     Right cervical: No superficial, deep or posterior cervical adenopathy.    Left cervical: No superficial, deep or posterior cervical adenopathy.     Upper Body:     Right upper body: No supraclavicular or axillary adenopathy.     Left upper body: No supraclavicular or axillary adenopathy.  Neurological:     General:  No focal deficit present.     Mental Status: He is alert and oriented to person, place, and time.  Psychiatric:        Mood and Affect: Mood normal.        Behavior: Behavior normal.     LABS:   CBC    Component Value Date/Time   WBC 5.5 04/27/2024 0848   RBC 4.41 04/27/2024 0848   HGB 12.1 (L) 04/27/2024 0848   HGB 13.6 11/26/2023 1040   HGB 14.8 07/15/2023 1142   HGB 13.5 04/09/2017 1249   HCT 38.7 (L) 04/27/2024 0848   HCT 48.2 07/15/2023 1142   HCT 42.4 04/09/2017 1249   PLT 163 04/27/2024 0848   PLT 197 11/26/2023 1040   PLT 205 07/15/2023 1142   MCV 87.8 04/27/2024 0848   MCV 86 07/15/2023 1142   MCV 84.8 04/09/2017 1249   MCH 27.4 04/27/2024 0848   MCHC 31.3 04/27/2024 0848   RDW 14.6 04/27/2024 0848   RDW 13.1 07/15/2023 1142   RDW 15.4 (H) 04/09/2017 1249   LYMPHSABS 0.4 (L) 04/27/2024 0848   LYMPHSABS 1.1 07/31/2021 1026   LYMPHSABS 1.0 04/09/2017 1249   MONOABS 0.5 04/27/2024 0848   MONOABS 1.9 (H) 04/09/2017 1249   EOSABS 0.1 04/27/2024 0848   EOSABS 0.1 07/31/2021 1026   BASOSABS 0.0 04/27/2024 0848   BASOSABS 0.0 07/31/2021 1026   BASOSABS 0.0 04/09/2017 1249    CMP    Component Value Date/Time   NA 137 04/27/2024 0848   NA 140 07/15/2023 1142   NA 136 04/09/2017 1249   K 3.9 04/27/2024 0848   K 4.6 04/09/2017 1249   CL 101 04/27/2024 0848   CO2 26 04/27/2024 0848   CO2 27 04/09/2017 1249   GLUCOSE 172 (H) 04/27/2024 0848   GLUCOSE 103 04/09/2017 1249   BUN 25 (H) 04/27/2024 0848   BUN 13 07/15/2023 1142   BUN 23.9 04/09/2017 1249   CREATININE 1.27 (H) 04/27/2024 0848   CREATININE 1.10 11/26/2023 1040   CREATININE 1.21 (H) 05/08/2020 1051   CREATININE 1.4 (H) 04/09/2017 1249   CALCIUM  8.9 04/27/2024 0848   CALCIUM  9.6 04/09/2017 1249   PROT 6.7 04/27/2024 0848  PROT 6.9 03/04/2023 1515   PROT 7.8 04/09/2017 1249   ALBUMIN  3.7 04/27/2024 0848   ALBUMIN  4.6 03/04/2023 1515   ALBUMIN  3.6 04/09/2017 1249   AST 25 04/27/2024 0848    AST 20 11/26/2023 1040   AST 13 04/09/2017 1249   ALT 20 04/27/2024 0848   ALT 19 11/26/2023 1040   ALT 9 04/09/2017 1249   ALKPHOS 36 (L) 04/27/2024 0848   ALKPHOS 58 04/09/2017 1249   BILITOT 0.9 04/27/2024 0848   BILITOT 0.7 11/26/2023 1040   BILITOT 1.01 04/09/2017 1249   GFRNONAA 59 (L) 04/27/2024 0848   GFRNONAA >60 11/26/2023 1040   GFRNONAA 60 05/08/2020 1051   GFRAA >60 06/02/2020 1239   GFRAA 70 05/08/2020 1051     No results found for: CEA1, CEA / No results found for: CEA1, CEA Lab Results  Component Value Date   PSA1 3.5 07/05/2021   No results found for: CAN199 No results found for: CAN125  No results found for: TOTALPROTELP, ALBUMINELP, A1GS, A2GS, BETS, BETA2SER, GAMS, MSPIKE, SPEI No results found for: TIBC, FERRITIN, IRONPCTSAT No results found for: LDH   STUDIES:   No results found.

## 2024-04-27 NOTE — Patient Instructions (Signed)

## 2024-04-27 NOTE — Progress Notes (Signed)
 Patient has been examined by Dr. Ellin Saba. Vital signs and labs have been reviewed by MD - ANC, Creatinine, LFTs, hemoglobin, and platelets are within treatment parameters per M.D. - pt may proceed with treatment.  Primary RN and pharmacy notified.

## 2024-04-28 ENCOUNTER — Inpatient Hospital Stay: Admitting: Licensed Clinical Social Worker

## 2024-04-28 ENCOUNTER — Other Ambulatory Visit: Payer: Self-pay

## 2024-04-28 ENCOUNTER — Encounter: Payer: Self-pay | Admitting: Internal Medicine

## 2024-04-28 DIAGNOSIS — C3490 Malignant neoplasm of unspecified part of unspecified bronchus or lung: Secondary | ICD-10-CM

## 2024-04-28 DIAGNOSIS — N401 Enlarged prostate with lower urinary tract symptoms: Secondary | ICD-10-CM

## 2024-04-28 MED ORDER — FINASTERIDE 5 MG PO TABS: 5.0000 mg | ORAL_TABLET | Freq: Every day | ORAL | 0 refills | Status: DC

## 2024-04-28 NOTE — Telephone Encounter (Signed)
 Sent to pharmacy

## 2024-04-28 NOTE — Progress Notes (Signed)
 [facility] CSW Progress Note  Clinical Social Worker contacted pt's wife by phone to follow-up on financial concerns.    Interventions: Pt's wife reports she mailed in an application for food stamps today.  CSW requested pt's wife inform if approved to start the process to apply for the [employer].  Pt's wife inquired about assistance for medical bills.  CSW instructed pt to contact billing directly to inform pt is unable to pay the full amount of medical bills to see if pt may qualify for a discount and to set up a payment plan.  Pt's wife will contact CSW if she would like further assistance from [name].        Follow Up Plan:  Patient will contact CSW with any support or resource needs    Randall JONELLE Manna, LCSW Clinical Social Worker [facility]

## 2024-05-04 ENCOUNTER — Ambulatory Visit: Attending: Internal Medicine | Admitting: *Deleted

## 2024-05-04 DIAGNOSIS — I480 Paroxysmal atrial fibrillation: Secondary | ICD-10-CM | POA: Diagnosis not present

## 2024-05-04 DIAGNOSIS — Z5181 Encounter for therapeutic drug level monitoring: Secondary | ICD-10-CM | POA: Diagnosis not present

## 2024-05-04 LAB — POCT INR: INR: 2.4 (ref 2.0–3.0)

## 2024-05-04 NOTE — Patient Instructions (Signed)
 Continue warfarin 1/2 tablet daily except 1 tablet on Mondays, Wednesdays and Fridays.  Continue greens/salads  Recheck INR in 4 weeks.  Finished chemo and radiation

## 2024-05-04 NOTE — Progress Notes (Signed)
 INR-2.4 Please see anticoagulation encounter.

## 2024-05-06 ENCOUNTER — Encounter: Payer: Self-pay | Admitting: Oncology

## 2024-05-10 ENCOUNTER — Other Ambulatory Visit: Payer: Self-pay

## 2024-05-10 ENCOUNTER — Encounter: Payer: Self-pay | Admitting: Internal Medicine

## 2024-05-10 MED ORDER — PANTOPRAZOLE SODIUM 40 MG PO TBEC: 40.0000 mg | DELAYED_RELEASE_TABLET | Freq: Every day | ORAL | 1 refills | Status: DC

## 2024-05-11 ENCOUNTER — Ambulatory Visit (INDEPENDENT_AMBULATORY_CARE_PROVIDER_SITE_OTHER): Admitting: Internal Medicine

## 2024-05-11 ENCOUNTER — Encounter: Payer: Self-pay | Admitting: Internal Medicine

## 2024-05-11 VITALS — BP 115/66 | HR 90 | Ht 70.0 in | Wt 219.2 lb

## 2024-05-11 DIAGNOSIS — C3490 Malignant neoplasm of unspecified part of unspecified bronchus or lung: Secondary | ICD-10-CM

## 2024-05-11 DIAGNOSIS — J9611 Chronic respiratory failure with hypoxia: Secondary | ICD-10-CM | POA: Diagnosis not present

## 2024-05-11 DIAGNOSIS — L239 Allergic contact dermatitis, unspecified cause: Secondary | ICD-10-CM | POA: Diagnosis not present

## 2024-05-11 DIAGNOSIS — I480 Paroxysmal atrial fibrillation: Secondary | ICD-10-CM

## 2024-05-11 DIAGNOSIS — I7143 Infrarenal abdominal aortic aneurysm, without rupture: Secondary | ICD-10-CM

## 2024-05-11 DIAGNOSIS — E1169 Type 2 diabetes mellitus with other specified complication: Secondary | ICD-10-CM | POA: Diagnosis not present

## 2024-05-11 DIAGNOSIS — I10 Essential (primary) hypertension: Secondary | ICD-10-CM

## 2024-05-11 DIAGNOSIS — J449 Chronic obstructive pulmonary disease, unspecified: Secondary | ICD-10-CM

## 2024-05-11 MED ORDER — HYDROXYZINE HCL 25 MG PO TABS: 25.0000 mg | ORAL_TABLET | Freq: Three times a day (TID) | ORAL | 5 refills | Status: DC | PRN

## 2024-05-11 NOTE — Patient Instructions (Signed)
 Please take Hydroxyzine  as needed for itching.  Please continue to take medications as prescribed.  Please continue to follow low salt diet and ambulate as tolerated.

## 2024-05-11 NOTE — Assessment & Plan Note (Signed)
 On metoprolol  for rate control Diltiazem  was held by Oncology due to hypotension On Coumadin  for [facility] Followed by cardiology and [facility] clinic

## 2024-05-11 NOTE — Assessment & Plan Note (Signed)
 Overall better with Breztri , gets it through patient assistance program Has Duoneb for PRN use Had switch to DuoNeb for maintenance treatment as well, was not able to continue Yupelri  due to cost

## 2024-05-11 NOTE — Assessment & Plan Note (Addendum)
 He attributes it to immunotherapy Has chronic itching over bilateral upper and lower extremities Hydroxyzine  as needed for itching Advised to use betamethasone  or cortisol cream for local relief

## 2024-05-11 NOTE — Assessment & Plan Note (Signed)
 Currently at 2 LPM home O2, may need to increase up to 4 LPM for dyspnea/hypoxia Due to COPD and history of lung cancer Has oxygen  supplies and portable O2, prescription has been sent to [facility]

## 2024-05-11 NOTE — Progress Notes (Signed)
 Established Patient Office Visit  Subjective:  Patient ID: Randall Sullivan, male    DOB: 1951/03/01  Age: 73 y.o. MRN: [id number]  CC:  Chief Complaint  Patient presents with   Diabetes   COPD    HPI Randall Sullivan is a 73 y.o. male with past medical history of HTN, PAF, CVA, lung cancer s/p lobectomy, HLD and insomnia who presents for f/u of his chronic medical conditions.  He takes Coumadin  for PAF and takes statin for HLD.  He takes metoprolol  for A-fib, but diltiazem  has been stopped due to hypotensive spells.  Denies any chest pain or palpitations currently.  Denies any recent change in sensation over UE or LE.  He reports chronic cough and has difficulty clearing mucus, but has noticed improvement in dyspnea with Breztri  and DuoNeb nebulizer treatment.  He denies any fever or chills.  He uses DuoNeb nebulizer for dyspnea or wheezing and uses it about 2-3 times in a day.  He was not able to continue Yupelri  due to its cost.  Denies any recent fever, chills or hemoptysis. He has home O2. He requires O2 at rest now as well.  He is undergoing immunotherapy for history of recurrent NSCLC.  He reports having chronic itching over bilateral upper and lower extremities with dark brownish discoloration.  He has tried using cortisone cream without much relief.  He was given hydroxyzine , which had improved itching, but has run out of it.  BPH: He reports nocturia, which has improved with Flomax  0.8 mg qHS and finasteride  5 mg QD.  Denies any dysuria or hematuria currently.  Denies weak urinary stream currently.  He complains of chronic intermittent dizziness, which improves with  meclizine .   Past Medical History:  Diagnosis Date   A-fib (HCC) 2017   after last lung surgery patient had a history if Afib documented in hospital    AAA (abdominal aortic aneurysm) (HCC)    4.0 cm 09/17/22 US    Acute appendicitis 07/14/2021   Arthritis    back & legs ( knees)   Cancer (HCC)    lung    Complication of anesthesia    agitated upon waking from anesth.    Dyspnea    Dysrhythmia    A. Fib   Emphysema    Emphysema of lung (HCC)    History of kidney stones    Hyperlipidemia    Hypertension    Oxygen  dependent    2L continuous   Peripheral vascular disease (HCC)    Aortic Aneurysm   Pneumonia    Pre-diabetes    Sleep apnea    No OSA, but wears 2L [place] O2 continuous including at night   Stroke [facility]) 2022   Right sided weakness, decreased hearing, balance and memory loss   Tobacco abuse     Past Surgical History:  Procedure Laterality Date   HERNIA REPAIR Bilateral 1999   umbilical   IR IMAGING GUIDED PORT INSERTION  12/09/2023   LAPAROSCOPIC APPENDECTOMY N/A 07/15/2021   Procedure: APPENDECTOMY LAPAROSCOPIC;  Surgeon: Kallie Manuelita BROCKS, MD;  Location: [facility];  Service: General;  Laterality: N/A;   VIDEO ASSISTED THORACOSCOPY Right 12/21/2018   Procedure: VIDEO ASSISTED THORACOSCOPY AND RESECTION OF BLEBS RIGHT LOWER LOBE;  Surgeon: Kerrin Elspeth BROCKS, MD;  Location: [facility] OR;  Service: Thoracic;  Laterality: Right;   VIDEO ASSISTED THORACOSCOPY (VATS)/ LOBECTOMY Left 08/01/2016   Procedure: VIDEO ASSISTED THORACOSCOPY (VATS)/ LEFT UPPER LOBECTOMY with lymph node sampling and onQ placement;  Surgeon:  Elspeth JAYSON Millers, MD;  Location: [facility] OR;  Service: Thoracic;  Laterality: Left;   VIDEO ASSISTED THORACOSCOPY (VATS)/WEDGE RESECTION Right 12/09/2018   Procedure: VIDEO ASSISTED THORACOSCOPY (VATS)/WEDGE RESECTION;  Surgeon: Millers Elspeth JAYSON, MD;  Location: [facility] OR;  Service: Thoracic;  Laterality: Right;   VIDEO BRONCHOSCOPY Bilateral 12/21/2018   Procedure: VIDEO BRONCHOSCOPY;  Surgeon: Millers Elspeth JAYSON, MD;  Location: [facility] OR;  Service: Thoracic;  Laterality: Bilateral;   VIDEO BRONCHOSCOPY WITH ENDOBRONCHIAL NAVIGATION N/A 07/03/2016   Procedure: VIDEO BRONCHOSCOPY WITH ENDOBRONCHIAL NAVIGATION;  Surgeon: Elspeth JAYSON Millers, MD;  Location: [facility] OR;  Service:  Thoracic;  Laterality: N/A;   VIDEO BRONCHOSCOPY WITH ENDOBRONCHIAL NAVIGATION N/A 10/24/2023   Procedure: VIDEO BRONCHOSCOPY WITH ENDOBRONCHIAL NAVIGATION;  Surgeon: Millers Elspeth JAYSON, MD;  Location: [facility] OR;  Service: Thoracic;  Laterality: N/A;   VIDEO BRONCHOSCOPY WITH ENDOBRONCHIAL ULTRASOUND N/A 10/24/2023   Procedure: VIDEO BRONCHOSCOPY WITH ENDOBRONCHIAL ULTRASOUND;  Surgeon: Millers Elspeth JAYSON, MD;  Location: [facility] OR;  Service: Thoracic;  Laterality: N/A;   VIDEO BRONCHOSCOPY WITH INSERTION OF INTERBRONCHIAL VALVE (IBV) N/A 12/11/2018   Procedure: VIDEO BRONCHOSCOPY WITH INSERTION OF INTERBRONCHIAL VALVE (IBV);  Surgeon: Millers Elspeth JAYSON, MD;  Location: [facility] OR;  Service: Thoracic;  Laterality: N/A;   VIDEO BRONCHOSCOPY WITH INSERTION OF INTERBRONCHIAL VALVE (IBV) N/A 02/10/2019   Procedure: VIDEO BRONCHOSCOPY WITH REMOVAL OF INTERBRONCHIAL VALVE (IBV);  Surgeon: Millers Elspeth JAYSON, MD;  Location: [facility] OR;  Service: Thoracic;  Laterality: N/A;    Family History  Problem Relation Age of Onset   Cancer Mother    Cancer Sister    Aneurysm Sister    Kidney disease Paternal Aunt    Asthma Brother    Cancer - Lung Brother    Cancer Brother    Arthritis Brother     Social History   Socioeconomic History   Marital status: Married    Spouse name: Not on file   Number of children: 2   Years of education: Not on file   Highest education level: 9th grade  Occupational History   Occupation: Vinyl siding   Occupation: Retired  Tobacco Use   Smoking status: Former    Current packs/day: 0.00    Average packs/day: 1 pack/day for 40.0 years (40.0 ttl pk-yrs)    Types: Cigarettes    Start date: 05/28/1976    Quit date: 05/28/2016    Years since quitting: 7.9   Smokeless tobacco: Never  Vaping Use   Vaping status: Never Used  Substance and Sexual Activity   Alcohol use: Not Currently    Comment: quit- 2016   Drug use: No   Sexual activity: Not Currently  Other Topics Concern    Not on file  Social History Narrative   Married for 20 years,second.Lives with wife. 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   Social Drivers of Health   Financial Resource Strain: Medium Risk (05/10/2024)   Overall Financial Resource Strain (CARDIA)    Difficulty of Paying Living Expenses: Somewhat hard  Food Insecurity: No Food Insecurity (05/10/2024)   Hunger Vital Sign    Worried About Running Out of Food in the Last Year: Never true    Ran Out of Food in the Last Year: Never true  Transportation Needs: No Transportation Needs (05/10/2024)   PRAPARE - Administrator, Civil Service (Medical): No    Lack of Transportation (Non-Medical): No  Physical Activity: Inactive (05/10/2024)   Exercise Vital Sign    Days of Exercise per Week: 0 days  Minutes of Exercise per Session: Not on file  Stress: No Stress Concern Present (05/10/2024)   [employer] of Occupational Health - Occupational Stress Questionnaire    Feeling of Stress: Not at all  Social Connections: Socially Isolated (05/10/2024)   Social Connection and Isolation Panel    Frequency of Communication with Friends and Family: Once a week    Frequency of Social Gatherings with Friends and Family: Once a week    Attends Religious Services: Never    Database administrator or Organizations: No    Attends Engineer, structural: Not on file    Marital Status: Married  Intimate Partner Violence: Patient Unable To Answer (09/17/2023)   Humiliation, Afraid, Rape, and Kick questionnaire    Fear of Current or Ex-Partner: Patient unable to answer    Emotionally Abused: Patient unable to answer    Physically Abused: Patient unable to answer    Sexually Abused: Patient unable to answer    Outpatient Medications Prior to Visit  Medication Sig Dispense Refill   acetaminophen  (TYLENOL ) 325 MG tablet Take 2 tablets (650 mg total) by mouth every 6 (six) hours as needed for mild pain (or Fever >/= 101).      ALPRAZolam  (XANAX ) 0.5 MG tablet Take 0.5 mg by mouth daily.     aluminum -magnesium  hydroxide 200-200 MG/5ML suspension Take 15 mLs by mouth every 6 (six) hours as needed for indigestion (Mix 1:1 with lidocaine  and swish and spit). 355 mL 2   betamethasone  dipropionate 0.05 % cream Apply topically 2 (two) times daily. 60 g 2   budesonide -glycopyrrolate -formoterol  (BREZTRI  AEROSPHERE) 160-9-4.8 MCG/ACT AERO inhaler Inhale 2 puffs into the lungs in the morning and at bedtime. 33 g 4   cetirizine  (ZYRTEC ) 10 MG tablet Take 10 mg by mouth daily.     Cholecalciferol  (VITAMIN D3) 125 MCG (5000 UT) CAPS Take 1 capsule (5,000 Units total) by mouth daily. 30 capsule 0   fenofibrate  (TRICOR ) 145 MG tablet Take 1 tablet (145 mg total) by mouth daily. 90 tablet 1   finasteride  (PROSCAR ) 5 MG tablet Take 1 tablet (5 mg total) by mouth daily. 30 tablet 0   guaiFENesin  (MUCINEX ) 600 MG 12 hr tablet Take by mouth 2 (two) times daily.     ipratropium-albuterol  (DUONEB) 0.5-2.5 (3) MG/3ML SOLN USE 1 AMPULE IN NEBULIZER 4 TIMES DAILY AND AS NEEDED FOR SHORTNESS OF BREATH AND FOR WHEEZING 450 mL 0   lidocaine  (XYLOCAINE ) 2 % solution Use as directed 15 mLs in the mouth or throat every 6 (six) hours as needed for mouth pain (Mix 1:1 with Maalox and swish and spit). 100 mL 2   lidocaine -prilocaine  (EMLA ) cream Apply 1 Application topically as needed. 30 g 0   magnesium  oxide (MAG-OX) 400 (240 Mg) MG tablet Take 1 tablet (400 mg total) by mouth 2 (two) times daily. 60 tablet 3   meclizine  (ANTIVERT ) 25 MG tablet Take 1 tablet (25 mg total) by mouth 3 (three) times daily as needed for dizziness. 30 tablet 0   metoprolol  succinate (TOPROL  XL) 50 MG 24 hr tablet Take 1 tablet (50 mg total) by mouth daily. Take with or immediately following a meal. 90 tablet 1   Omega-3 Fatty Acids (FISH OIL ULTRA PO) Take 1,200 mg by mouth in the morning and at bedtime.     OXYGEN  Inhale 2 L into the lungs continuous. Hx of lung ca, copd, and  emphysema     Palonosetron  HCl (ALOXI  IV) Inject into the  vein.     pantoprazole  (PROTONIX ) 40 MG tablet Take 1 tablet (40 mg total) by mouth daily. 30 tablet 1   polyethylene glycol (MIRALAX  / GLYCOLAX ) 17 g packet Take 17 g by mouth daily as needed for mild constipation.     rosuvastatin  (CRESTOR ) 40 MG tablet Take 1 tablet (40 mg total) by mouth daily. 90 tablet 3   tamsulosin  (FLOMAX ) 0.4 MG CAPS capsule Take 2 capsules (0.8 mg total) by mouth daily. 180 capsule 3   warfarin (COUMADIN ) 5 MG tablet Take 0.5-1 tablets (2.5-5 mg total) by mouth See admin instructions. Take 2.5 mg by mouth Tuesday and Friday and take 5 mg on Monday, Wednesday, Thursday, Saturday and Sunday 90 tablet 1   diltiazem  (CARDIZEM  CD) 180 MG 24 hr capsule Take 1 capsule (180 mg total) by mouth daily. 90 capsule 3   hydrOXYzine  (ATARAX ) 25 MG tablet Take 1 tablet (25 mg total) by mouth every 8 (eight) hours as needed for itching. 30 tablet 0   No facility-administered medications prior to visit.    No Known Allergies   ROS Review of Systems  Constitutional:  Positive for fatigue. Negative for chills and fever.  HENT:  Negative for congestion, sinus pressure and sinus pain.   Eyes:  Positive for visual disturbance. Negative for redness.  Respiratory:  Positive for cough and shortness of breath.   Cardiovascular:  Negative for chest pain and palpitations.  Gastrointestinal:  Negative for diarrhea, nausea and vomiting.  Genitourinary:  Positive for frequency. Negative for dysuria and hematuria.  Musculoskeletal:  Negative for neck pain and neck stiffness.  Skin:  Negative for rash.  Neurological:  Positive for dizziness. Negative for tremors, seizures, syncope and headaches.  Psychiatric/Behavioral:  Positive for sleep disturbance. Negative for agitation and behavioral problems.       Objective:    Physical Exam Vitals reviewed.  Constitutional:      General: He is not in acute distress.    Appearance: He  is not diaphoretic.  HENT:     Head: Normocephalic and atraumatic.     Nose: Nose normal.     Mouth/Throat:     Mouth: Mucous membranes are moist.  Eyes:     General: No scleral icterus.    Extraocular Movements: Extraocular movements intact.  Cardiovascular:     Rate and Rhythm: Normal rate and regular rhythm.     Heart sounds: Normal heart sounds. No murmur heard. Pulmonary:     Breath sounds: No wheezing or rales.  Abdominal:     Palpations: Abdomen is soft.     Tenderness: There is no abdominal tenderness.  Musculoskeletal:     Cervical back: Neck supple. No tenderness.     Right lower leg: No edema.     Left lower leg: No edema.  Skin:    General: Skin is warm.     Findings: Rash (Dark brownish patches over bilateral upper extremities) present.  Neurological:     General: No focal deficit present.     Mental Status: He is alert and oriented to person, place, and time. Mental status is at baseline.     Sensory: Sensory deficit (B/l feet) present.  Psychiatric:        Mood and Affect: Mood normal.        Behavior: Behavior normal.     BP 115/66   Pulse (!) 105   Ht 5' 10 (1.778 m)   Wt 219 lb 3.2 oz (99.4 kg)   SpO2 98%  Comment: set to 2  BMI 31.45 kg/m  Wt Readings from Last 3 Encounters:  05/11/24 219 lb 3.2 oz (99.4 kg)  04/27/24 213 lb 6.5 oz (96.8 kg)  04/13/24 215 lb 2.7 oz (97.6 kg)    Lab Results  Component Value Date   TSH 1.007 03/30/2024   Lab Results  Component Value Date   WBC 5.5 04/27/2024   HGB 12.1 (L) 04/27/2024   HCT 38.7 (L) 04/27/2024   MCV 87.8 04/27/2024   PLT 163 04/27/2024   Lab Results  Component Value Date   NA 137 04/27/2024   K 3.9 04/27/2024   CHLORIDE 99 04/09/2017   CO2 26 04/27/2024   GLUCOSE 172 (H) 04/27/2024   BUN 25 (H) 04/27/2024   CREATININE 1.27 (H) 04/27/2024   BILITOT 0.9 04/27/2024   ALKPHOS 36 (L) 04/27/2024   AST 25 04/27/2024   ALT 20 04/27/2024   PROT 6.7 04/27/2024   ALBUMIN  3.7 04/27/2024    CALCIUM  8.9 04/27/2024   ANIONGAP 10 04/27/2024   EGFR 63 07/15/2023   Lab Results  Component Value Date   CHOL 188 07/15/2023   Lab Results  Component Value Date   HDL 33 (L) 07/15/2023   Lab Results  Component Value Date   LDLCALC 101 (H) 07/15/2023   Lab Results  Component Value Date   TRIG 317 (H) 07/15/2023   Lab Results  Component Value Date   CHOLHDL 5.7 (H) 07/15/2023   Lab Results  Component Value Date   HGBA1C 6.1 (H) 07/15/2023      Assessment & Plan:   Problem List Items Addressed This Visit    Problem List Items Addressed This Visit       Cardiovascular and Mediastinum   Essential (primary) hypertension   BP Readings from Last 1 Encounters:  05/11/24 115/66   Remains WNL at home Well controlled, on Metoprolol   Held Diltiazem  (for PAF) due to hypotension Counseled for compliance with the medications Advised DASH diet and moderate exercise/walking, at least 150 mins/week      PAF (paroxysmal atrial fibrillation) (HCC)   On metoprolol  for rate control Diltiazem  was held by Oncology due to hypotension On Coumadin  for [facility] Followed by cardiology and [facility] clinic      Infrarenal abdominal aortic aneurysm (AAA) without rupture (HCC)   Infrarenal abdominal aortic aneurysm measuring up to 4.0 x 3.8 cm with a large burden of eccentric mural thrombus.  Had offered to refer to Vascular surgeon Wants to discuss with his Cardiologist - Dr Lavona        Respiratory   COPD GOLD 3 using 02 prn - Primary   Overall better with Breztri , gets it through patient assistance program Has Duoneb for PRN use Had switch to DuoNeb for maintenance treatment as well, was not able to continue Yupelri  due to cost      Chronic respiratory failure with hypoxia (HCC)   Currently at 2 LPM home O2, may need to increase up to 4 LPM for dyspnea/hypoxia Due to COPD and history of lung cancer Has oxygen  supplies and portable O2, prescription has been sent to [facility]       Recurrent non-small cell lung cancer (NSCLC) (HCC)   Followed by Heme./Onc. Has completed chemoradiation Undergoing immunotherapy  -Status post left VATS, left upper lobectomy with mediastinal lymph node dissection under the care of Dr. Kerrin on 08/01/2016. - Status post right VATS with wedge resection of right upper lobe nodule and bleb resection under the care of  Dr. Kerrin on December 10, 2018.          Endocrine   Type 2 diabetes mellitus with other specified complication (HCC)   Lab Results  Component Value Date   HGBA1C 6.1 (H) 07/15/2023   Associated with HTN and HLD Well-controlled, check HbA1c Was on Glimepiride  1 mg QD, now diet controlled Would prefer to start Metformin or SGLT2 inhibitor if HbA1C increases now On statin Needs to see Ophthalmologist for diabetic eye exam      Relevant Orders   Hemoglobin A1c     Musculoskeletal and Integument   Allergic dermatitis   He attributes it to immunotherapy Has chronic itching over bilateral upper and lower extremities Hydroxyzine  as needed for itching Advised to use betamethasone  or cortisol cream for local relief      Relevant Medications   hydrOXYzine  (ATARAX ) 25 MG tablet        Meds ordered this encounter  Medications   hydrOXYzine  (ATARAX ) 25 MG tablet    Sig: Take 1 tablet (25 mg total) by mouth every 8 (eight) hours as needed for itching.    Dispense:  30 tablet    Refill:  5    Follow-up: Return in about 6 months (around 11/11/2024) for COPD and A Fib.    Suzzane MARLA Blanch, MD

## 2024-05-11 NOTE — Assessment & Plan Note (Signed)
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  Had offered to refer to Vascular surgeon Wants to discuss with his Cardiologist - Dr [name] 

## 2024-05-11 NOTE — Assessment & Plan Note (Signed)
 BP Readings from Last 1 Encounters:  05/11/24 115/66   Remains WNL at home Well controlled, on Metoprolol   Held Diltiazem  (for PAF) due to hypotension Counseled for compliance with the medications Advised DASH diet and moderate exercise/walking, at least 150 mins/week

## 2024-05-11 NOTE — Assessment & Plan Note (Addendum)
 Lab Results  Component Value Date   HGBA1C 6.1 (H) 07/15/2023   Associated with HTN and HLD Well-controlled, check HbA1c Was on Glimepiride  1 mg QD, now diet controlled Would prefer to start Metformin or SGLT2 inhibitor if HbA1C increases now On statin Needs to see Ophthalmologist for diabetic eye exam

## 2024-05-11 NOTE — Assessment & Plan Note (Addendum)
 Followed by Heme./Onc. Has completed chemoradiation Undergoing immunotherapy  -Status post left VATS, left upper lobectomy with mediastinal lymph node dissection under the care of Dr. Kerrin on 08/01/2016. - Status post right VATS with wedge resection of right upper lobe nodule and bleb resection under the care of Dr. Kerrin on December 10, 2018.

## 2024-05-12 ENCOUNTER — Other Ambulatory Visit: Payer: Self-pay

## 2024-05-17 ENCOUNTER — Inpatient Hospital Stay: Attending: Internal Medicine | Admitting: Licensed Clinical Social Worker

## 2024-05-17 DIAGNOSIS — R739 Hyperglycemia, unspecified: Secondary | ICD-10-CM | POA: Insufficient documentation

## 2024-05-17 DIAGNOSIS — Z5112 Encounter for antineoplastic immunotherapy: Secondary | ICD-10-CM | POA: Insufficient documentation

## 2024-05-17 DIAGNOSIS — Z01818 Encounter for other preprocedural examination: Secondary | ICD-10-CM | POA: Insufficient documentation

## 2024-05-17 DIAGNOSIS — C3490 Malignant neoplasm of unspecified part of unspecified bronchus or lung: Secondary | ICD-10-CM

## 2024-05-17 DIAGNOSIS — Z87891 Personal history of nicotine dependence: Secondary | ICD-10-CM | POA: Insufficient documentation

## 2024-05-17 DIAGNOSIS — Z79620 Long term (current) use of immunosuppressive biologic: Secondary | ICD-10-CM | POA: Insufficient documentation

## 2024-05-17 DIAGNOSIS — C3411 Malignant neoplasm of upper lobe, right bronchus or lung: Secondary | ICD-10-CM | POA: Insufficient documentation

## 2024-05-18 ENCOUNTER — Inpatient Hospital Stay

## 2024-05-18 ENCOUNTER — Ambulatory Visit: Admitting: Oncology

## 2024-05-18 ENCOUNTER — Encounter: Payer: Self-pay | Admitting: Oncology

## 2024-05-18 ENCOUNTER — Ambulatory Visit (HOSPITAL_COMMUNITY)
Admission: RE | Admit: 2024-05-18 | Discharge: 2024-05-18 | Disposition: A | Discharge status: Home / Self Care | Source: Ambulatory Visit | Attending: Hematology | Admitting: Hematology

## 2024-05-18 DIAGNOSIS — J439 Emphysema, unspecified: Secondary | ICD-10-CM | POA: Diagnosis not present

## 2024-05-18 DIAGNOSIS — I700 Atherosclerosis of aorta: Secondary | ICD-10-CM | POA: Diagnosis not present

## 2024-05-18 DIAGNOSIS — C3490 Malignant neoplasm of unspecified part of unspecified bronchus or lung: Secondary | ICD-10-CM | POA: Insufficient documentation

## 2024-05-18 MED ORDER — IOHEXOL 300 MG/ML  SOLN
80.0000 mL | Freq: Once | INTRAMUSCULAR | Status: AC | PRN
  Administered 2024-05-18: 80 mL via INTRAVENOUS

## 2024-05-18 NOTE — Progress Notes (Signed)
 [facility] CSW Progress Note  Visual merchandiser spoke with pt's wife over the phone to follow-up on financial concerns.    Interventions: Pt's wife informed CSW they were approved for food stamps.  CSW provided instructions to apply for the [employer].  Message sent to patient financial resources specialist to follow up w/ pt.        Follow Up Plan:  Patient will contact CSW with any support or resource needs    Devere JONELLE Manna, LCSW Clinical Social Worker [facility]

## 2024-05-25 ENCOUNTER — Inpatient Hospital Stay

## 2024-05-25 ENCOUNTER — Encounter: Payer: Self-pay | Admitting: Cardiology

## 2024-05-25 ENCOUNTER — Inpatient Hospital Stay (HOSPITAL_BASED_OUTPATIENT_CLINIC_OR_DEPARTMENT_OTHER): Admitting: Oncology

## 2024-05-25 VITALS — BP 115/72 | HR 63 | Temp 96.6°F | Resp 20

## 2024-05-25 VITALS — Wt 216.5 lb

## 2024-05-25 DIAGNOSIS — Z5112 Encounter for antineoplastic immunotherapy: Secondary | ICD-10-CM | POA: Diagnosis not present

## 2024-05-25 DIAGNOSIS — R739 Hyperglycemia, unspecified: Secondary | ICD-10-CM

## 2024-05-25 DIAGNOSIS — C3490 Malignant neoplasm of unspecified part of unspecified bronchus or lung: Secondary | ICD-10-CM

## 2024-05-25 DIAGNOSIS — R21 Rash and other nonspecific skin eruption: Secondary | ICD-10-CM | POA: Insufficient documentation

## 2024-05-25 DIAGNOSIS — Z87891 Personal history of nicotine dependence: Secondary | ICD-10-CM | POA: Diagnosis not present

## 2024-05-25 DIAGNOSIS — Z01818 Encounter for other preprocedural examination: Secondary | ICD-10-CM | POA: Diagnosis not present

## 2024-05-25 DIAGNOSIS — G6289 Other specified polyneuropathies: Secondary | ICD-10-CM | POA: Diagnosis not present

## 2024-05-25 DIAGNOSIS — G629 Polyneuropathy, unspecified: Secondary | ICD-10-CM | POA: Insufficient documentation

## 2024-05-25 DIAGNOSIS — C3411 Malignant neoplasm of upper lobe, right bronchus or lung: Secondary | ICD-10-CM | POA: Diagnosis not present

## 2024-05-25 DIAGNOSIS — Z79620 Long term (current) use of immunosuppressive biologic: Secondary | ICD-10-CM | POA: Diagnosis not present

## 2024-05-25 DIAGNOSIS — E785 Hyperlipidemia, unspecified: Secondary | ICD-10-CM

## 2024-05-25 LAB — CBC WITH DIFFERENTIAL/PLATELET
Abs Immature Granulocytes: 0.03 K/uL (ref 0.00–0.07)
Basophils Absolute: 0 K/uL (ref 0.0–0.1)
Basophils Relative: 0 %
Eosinophils Absolute: 0.2 K/uL (ref 0.0–0.5)
Eosinophils Relative: 4 %
HCT: 39.9 % (ref 39.0–52.0)
Hemoglobin: 12.7 g/dL — ABNORMAL LOW (ref 13.0–17.0)
Immature Granulocytes: 0 %
Lymphocytes Relative: 8 %
Lymphs Abs: 0.5 K/uL — ABNORMAL LOW (ref 0.7–4.0)
MCH: 26.6 pg (ref 26.0–34.0)
MCHC: 31.8 g/dL (ref 30.0–36.0)
MCV: 83.5 fL (ref 80.0–100.0)
Monocytes Absolute: 0.8 K/uL (ref 0.1–1.0)
Monocytes Relative: 11 %
Neutro Abs: 5.2 K/uL (ref 1.7–7.7)
Neutrophils Relative %: 77 %
Platelets: 188 K/uL (ref 150–400)
RBC: 4.78 MIL/uL (ref 4.22–5.81)
RDW: 14.6 % (ref 11.5–15.5)
WBC: 6.8 K/uL (ref 4.0–10.5)
nRBC: 0 % (ref 0.0–0.2)

## 2024-05-25 LAB — COMPREHENSIVE METABOLIC PANEL WITH GFR
ALT: 18 U/L (ref 0–44)
AST: 23 U/L (ref 15–41)
Albumin: 3.6 g/dL (ref 3.5–5.0)
Alkaline Phosphatase: 36 U/L — ABNORMAL LOW (ref 38–126)
Anion gap: 8 (ref 5–15)
BUN: 20 mg/dL (ref 8–23)
CO2: 28 mmol/L (ref 22–32)
Calcium: 9.1 mg/dL (ref 8.9–10.3)
Chloride: 100 mmol/L (ref 98–111)
Creatinine, Ser: 1.04 mg/dL (ref 0.61–1.24)
GFR, Estimated: >60 mL/min (ref >60)
Glucose, Bld: 117 mg/dL — ABNORMAL HIGH (ref 70–99)
Potassium: 3.9 mmol/L (ref 3.5–5.1)
Sodium: 136 mmol/L (ref 135–145)
Total Bilirubin: 0.9 mg/dL (ref 0.0–1.2)
Total Protein: 6.9 g/dL (ref 6.5–8.1)

## 2024-05-25 LAB — MAGNESIUM: Magnesium: 1.9 mg/dL (ref 1.7–2.4)

## 2024-05-25 LAB — TSH: TSH: 2.108 u[IU]/mL (ref 0.350–4.500)

## 2024-05-25 LAB — HEMOGLOBIN A1C
Hgb A1c MFr Bld: 5.4 % (ref 4.8–5.6)
Mean Plasma Glucose: 108.28 mg/dL

## 2024-05-25 MED ORDER — SODIUM CHLORIDE 0.9 % IV SOLN: INTRAVENOUS | Status: DC

## 2024-05-25 MED ORDER — SODIUM CHLORIDE 0.9 % IV SOLN
1500.0000 mg | Freq: Once | INTRAVENOUS | Status: AC
  Administered 2024-05-25: 1500 mg via INTRAVENOUS
  Filled 2024-05-25: qty 30

## 2024-05-25 NOTE — Patient Instructions (Signed)

## 2024-05-25 NOTE — Assessment & Plan Note (Addendum)
 Patient previously developed skin rash on lateral chest wall, groin and upper thighs which was treated with 5 days of prednisone  30 mg daily Patient currently complains of rash on his arms that is also itchy  - Recommended to continue hydrocortisone  cream -Recommended to use Benadryl  topical cream and also Benadryl  oral 25 mg at night if the itching persists -Monitor for now

## 2024-05-25 NOTE — Progress Notes (Addendum)
 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 male was transferred to my care after his prior physician has left.   ASSESSMENT & PLAN:   Assessment & Plan: Randall Sullivan  is a 73 y.o. 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  Currently on consolidation durvalumab .  - Patient reports some rash with immunotherapy.  No other side effects - Continue durvalumab  1500 mg every 4 weeks - Labs reviewed today: CMP: WNL, CBC: Mild anemia but improved, TSH: Normal -We reviewed recent CT scan findings.  Looks more or less stable with stable lung nodules.  Repeat CT chest in 3 months. Due 07/2024  Return to clinic in 1 month for follow-up Skin rash Patient previously developed skin rash on lateral chest wall, groin and upper thighs which was treated with 5 days of prednisone  30 mg daily Patient currently complains of rash on his arms that is also itchy  - Recommended to continue hydrocortisone  cream -Recommended to use Benadryl  topical cream and also Benadryl  oral 25 mg at night if the itching persists -Monitor for now Other polyneuropathy Baseline numbness in the fingertips of hands, right more than left from stroke is stable.     The patient understands the plans discussed today and is in agreement with them.  He knows to contact our office if he develops concerns prior to his next appointment.  90 minutes of total time was spent for this patient encounter, including preparation, face-to-face counseling with the patient and coordination of care,  physical exam, and documentation of the encounter. > 50% of the time was spent on counseling as documented under my assessment and plan.    LILLETTE Verneta SAUNDERS Teague,acting as a Neurosurgeon for Siad Davonna, MD.,have documented all relevant documentation on the behalf of Siad Davonna, MD,as directed by  Siad Davonna, MD while in the presence of Siad Davonna, MD.  I, Siad Davonna MD, have reviewed the above documentation for accuracy and completeness, and I agree with the above.     Siad Davonna, MD  [facility]  - A DEPT OF [facility] [address] [place] [address] Dept: [phone] Dept Fax: [phone]   Orders Placed This Encounter  Procedures   Hemoglobin A1c     ONCOLOGY HISTORY:   I have reviewed his chart and materials related to his cancer extensively and collaborated history with the patient. Summary of oncologic history is as follows:  Left lung adenocarcinoma- Stage IA (T1b,N0,M0)  -06/01/2016: CT chest: 3.8 cm spiculated left upper lobe mass, suspicious for primary bronchogenic neoplasm. -06/19/2016: Initial PET: 3.3 x 1.4 cm hypermetabolic subsolid left upper lobe pulmonary nodule, compatible with primary bronchogenic neoplasm. No findings suspicious for metastatic disease. -07/03/2016: LUL lung biopsy. Pathology: Well differentiated adenocarcinoma. Cytology positive for adenocarcinoma.  -08/01/2016:  Left VATS, left upper lobectomy with mediastinal lymph node dissection.  Pathology: Invasive well differentiated adenocarcinoma, spanning 2.5 cm. No evidence of carcinoma in 4 of 4 lymph nodes (0/4). Surgical resections negative for carcinoma.   Recurrent stage III adenocarcinoma of right  lung   -10/15/2018: PET: The 8 by 5 mm right upper lobe pulmonary nodule is focally hypermetabolic. Although the SUV is only 1.9, this nodule is at the lower size limits for sensitive assessment by PET-CT, and  accordingly even at this lower SUV concern is raised for the possibility of malignancy. The lesion has also increased in size compared to 04/09/2017, suspicious for low-grade malignancy. -12/09/2018: Right vats with wedge resection of right upper lobe nodule and bleb resection.  (Stage Ia) Pathology: Invasive moderately differentiatred adenocarcioma, 0.7 cm, acinar predominant. Negative for visceral pleural invasion. Resection margins negative for carcinoma. Negative for lymphovascular invasion. One out of one lymph node negative for metastatic carcinoma. Benign pleural blebs, negative for carcinoma. -03/02/2019-06/11/2022: Every 3-6 months CT scans with NED -07/08/2023: CT chest: Increasing masslike soft tissue along the suture margin in the central right upper lobe, indicative of disease recurrence. Slightly enlarged spiculated nodule in the lateral right lower lobe. Additional nodules are stable. Recommend continued attention on follow-up. -08/19/2023: PET: Recurrent bronchogenic carcinoma in the medial right upper lobe, along the resection margin. Faint 6 mm nodule in the superior segment right lower lobe, too small for PET resolution. Focal hypermetabolism in the central prostate.  -10/21/2023: CT super D chest: Solid 2.6 cm central right upper lobe pulmonary nodule along the wedge resection suture line is stable to minimally decreased since 07/08/2023 chest CT. Solid 1.3 cm and 0.7 cm peripheral right lower lobe pulmonary nodules are not substantially changed since 07/08/2023 chest CT. No new or progressive metastatic disease in the chest. No thoracic adenopathy. -10/24/2023: Right paratracheal lymph node FNA.  Cytology: Adenocarcinoma. -12/02/2023: MRI brain:  No strong evidence of cerebral metastatic disease on this noncontrast exam. -11/03/2023: Foundation 1 CDX:  Insufficient tissue -11/03/2023: PD-L1 22 C3 (TPS): 5% -12/04/2023: Foundation one blood NGS: TMB-low, MSI-high not detected, no  other targetable mutations  -12/23/2023- 02/03/2024: Chemoradiation therapy with weekly carboplatin  and paclitaxel  -02/25/2024 to current: Consolidation durvalumab  every 2 weeks  Current Treatment:  Consolidation durvalumab  1500mg  every 4 weeks  INTERVAL HISTORY:   Randall Sullivan is here today for follow up. Patient is accompanied by wife and daughter today. He is experiencing itching during his current cancer treatment regimen. The itching is not associated with a rash and is described as 'just itchy.' He has been using Cortisone 10 and lotions to manage the symptoms.  He denies diarrhea or abdominal pain but reports increased fatigue over the past few days.Despite the fatigue, he maintains a good appetite and has gained weight, currently weighing 216 pounds.  He has a history of COPD and has been on continuous oxygen  since December following a COPD flare that required hospitalization. He quit smoking in 2017.  I have reviewed the past medical history, past surgical history, social history and family history with the patient and they are unchanged from previous note.  ALLERGIES:  has no known allergies.  MEDICATIONS:  Current Outpatient Medications  Medication Sig Dispense Refill   acetaminophen  (TYLENOL ) 325 MG tablet Take 2 tablets (650 mg total) by mouth every 6 (six) hours as needed for mild pain (or Fever >/= 101).     ALPRAZolam  (XANAX ) 0.5 MG tablet Take 0.5 mg by mouth daily.     aluminum -magnesium  hydroxide 200-200 MG/5ML suspension Take 15 mLs by mouth every 6 (six) hours as needed for indigestion (Mix 1:1 with lidocaine  and swish and spit). 355 mL 2   betamethasone  dipropionate 0.05 % cream Apply topically 2 (two) times daily. 60 g  2   budesonide -glycopyrrolate -formoterol  (BREZTRI  AEROSPHERE) 160-9-4.8 MCG/ACT AERO inhaler Inhale 2 puffs into the lungs in the morning and at bedtime. 33 g 4   cetirizine  (ZYRTEC ) 10 MG tablet Take 10 mg by mouth daily.     Cholecalciferol  (VITAMIN  D3) 125 MCG (5000 UT) CAPS Take 1 capsule (5,000 Units total) by mouth daily. 30 capsule 0   fenofibrate  (TRICOR ) 145 MG tablet Take 1 tablet (145 mg total) by mouth daily. 90 tablet 1   guaiFENesin  (MUCINEX ) 600 MG 12 hr tablet Take by mouth 2 (two) times daily.     hydrOXYzine  (ATARAX ) 25 MG tablet Take 1 tablet (25 mg total) by mouth every 8 (eight) hours as needed for itching. 30 tablet 5   lidocaine  (XYLOCAINE ) 2 % solution Use as directed 15 mLs in the mouth or throat every 6 (six) hours as needed for mouth pain (Mix 1:1 with Maalox and swish and spit). 100 mL 2   lidocaine -prilocaine  (EMLA ) cream Apply 1 Application topically as needed. 30 g 0   magnesium  oxide (MAG-OX) 400 (240 Mg) MG tablet Take 1 tablet (400 mg total) by mouth 2 (two) times daily. 60 tablet 3   meclizine  (ANTIVERT ) 25 MG tablet Take 1 tablet (25 mg total) by mouth 3 (three) times daily as needed for dizziness. 30 tablet 0   metoprolol  succinate (TOPROL  XL) 50 MG 24 hr tablet Take 1 tablet (50 mg total) by mouth daily. Take with or immediately following a meal. 90 tablet 1   Omega-3 Fatty Acids (FISH OIL ULTRA PO) Take 1,200 mg by mouth in the morning and at bedtime.     OXYGEN  Inhale 2 L into the lungs continuous. Hx of lung ca, copd, and emphysema     Palonosetron  HCl (ALOXI  IV) Inject into the vein.     pantoprazole  (PROTONIX ) 40 MG tablet Take 1 tablet (40 mg total) by mouth daily. 30 tablet 1   polyethylene glycol (MIRALAX  / GLYCOLAX ) 17 g packet Take 17 g by mouth daily as needed for mild constipation.     rosuvastatin  (CRESTOR ) 40 MG tablet Take 1 tablet (40 mg total) by mouth daily. 90 tablet 3   tamsulosin  (FLOMAX ) 0.4 MG CAPS capsule Take 2 capsules (0.8 mg total) by mouth daily. 180 capsule 3   warfarin (COUMADIN ) 5 MG tablet Take 0.5-1 tablets (2.5-5 mg total) by mouth See admin instructions. Take 2.5 mg by mouth Tuesday and Friday and take 5 mg on Monday, Wednesday, Thursday, Saturday and Sunday 90 tablet 1    finasteride  (PROSCAR ) 5 MG tablet Take 1 tablet (5 mg total) by mouth daily. 30 tablet 0   ipratropium-albuterol  (DUONEB) 0.5-2.5 (3) MG/3ML SOLN USE 1 AMPULE IN NEBULIZER 4 TIMES DAILY AND AS NEEDED FOR SHORTNESS OF BREATH AND FOR WHEEZING 450 mL 0   No current facility-administered medications for this visit.    REVIEW OF SYSTEMS:   Constitutional: Denies fevers, chills or abnormal weight loss Eyes: Denies blurriness of vision Ears, nose, mouth, throat, and face: Denies mucositis or sore throat Respiratory: Denies cough, dyspnea or wheezes Cardiovascular: Denies palpitation, chest discomfort or lower extremity swelling Gastrointestinal:  Denies nausea, heartburn or change in bowel habits Skin: Denies abnormal skin rashes Lymphatics: Denies new lymphadenopathy or easy bruising Neurological:Denies numbness, tingling or new weaknesses Behavioral/Psych: Mood is stable, no new changes  All other systems were reviewed with the patient and are negative.   VITALS:  Weight 216 lb 7.9 oz (98.2 kg).  Wt Readings from Last  3 Encounters:  05/25/24 216 lb 7.9 oz (98.2 kg)  05/11/24 219 lb 3.2 oz (99.4 kg)  04/27/24 213 lb 6.5 oz (96.8 kg)    Body mass index is 31.06 kg/m.  Performance status (ECOG): 2 - Symptomatic, <50% confined to bed  PHYSICAL EXAM:   GENERAL:alert, no distress and comfortable SKIN: skin color, texture, turgor are normal, no rashes or significant lesions EYES: normal, Conjunctiva are pink and non-injected, sclera clear Hello LUNGS: clear to auscultation and percussion with normal breathing effort HEART: regular rate & rhythm and no murmurs and no lower extremity edema ABDOMEN:abdomen soft, non-tender and normal bowel sounds Musculoskeletal:no cyanosis of digits and no clubbing  NEURO: alert & oriented x 3 with fluent speech, no focal motor/sensory deficits  LABORATORY DATA:  I have reviewed the data as listed  Lab Results  Component Value Date   WBC 6.8  05/25/2024   NEUTROABS 5.2 05/25/2024   HGB 12.7 (L) 05/25/2024   HCT 39.9 05/25/2024   MCV 83.5 05/25/2024   PLT 188 05/25/2024      Chemistry      Component Value Date/Time   NA 136 05/25/2024 0922   NA 140 07/15/2023 1142   NA 136 04/09/2017 1249   K 3.9 05/25/2024 0922   K 4.6 04/09/2017 1249   CL 100 05/25/2024 0922   CO2 28 05/25/2024 0922   CO2 27 04/09/2017 1249   BUN 20 05/25/2024 0922   BUN 13 07/15/2023 1142   BUN 23.9 04/09/2017 1249   CREATININE 1.04 05/25/2024 0922   CREATININE 1.10 11/26/2023 1040   CREATININE 1.21 (H) 05/08/2020 1051   CREATININE 1.4 (H) 04/09/2017 1249      Component Value Date/Time   CALCIUM  9.1 05/25/2024 0922   CALCIUM  9.6 04/09/2017 1249   ALKPHOS 36 (L) 05/25/2024 0922   ALKPHOS 58 04/09/2017 1249   AST 23 05/25/2024 0922   AST 20 11/26/2023 1040   AST 13 04/09/2017 1249   ALT 18 05/25/2024 0922   ALT 19 11/26/2023 1040   ALT 9 04/09/2017 1249   BILITOT 0.9 05/25/2024 0922   BILITOT 0.7 11/26/2023 1040   BILITOT 1.01 04/09/2017 1249       RADIOGRAPHIC STUDIES: I have personally reviewed the radiological images as listed and agreed with the findings in the report.  CT CHEST W CONTRAST CLINICAL DATA:  Non-small-cell lung cancer restaging * Tracking Code: BO *  EXAM: CT CHEST WITH CONTRAST  TECHNIQUE: Multidetector CT imaging of the chest was performed during intravenous contrast administration.  RADIATION DOSE REDUCTION: This exam was performed according to the departmental dose-optimization program which includes automated exposure control, adjustment of the mA and/or kV according to patient size and/or use of iterative reconstruction technique.  CONTRAST:  80mL OMNIPAQUE  IOHEXOL  300 MG/ML  SOLN  COMPARISON:  02/16/2024  FINDINGS: Cardiovascular: Severe aortic atherosclerosis. Left chest port catheter. Normal heart size. Left coronary artery calcifications. No pericardial effusion.  Mediastinum/Nodes: No  enlarged mediastinal, hilar, or axillary lymph nodes. Thyroid  gland, trachea, and esophagus demonstrate no significant findings.  Lungs/Pleura: Unchanged postoperative and post treatment appearance of the chest status post right upper lobe wedge resection and left upper lobectomy. Extensive perihilar scarring and volume loss of the perihilar right lung unchanged central, most nodular component similar at 2.2 x 1.6 cm (series 3, image 50). Unchanged right lower lobe nodules measuring 1.0 x 0.6 cm (series 3, image 101) and 0.7 cm (series 3, image 103). Background of severe emphysema. No pleural effusion or  pneumothorax.  Upper Abdomen: No acute abnormality.  Hepatic steatosis.  Musculoskeletal: No chest wall abnormality. No acute osseous findings.  IMPRESSION: 1. Unchanged postoperative and post treatment appearance of the chest status post right upper lobe wedge resection and left upper lobectomy. 2. Extensive perihilar scarring and volume loss of the perihilar right lung unchanged, central, most nodular component similar at 2.2 x 1.6 cm. 3. Unchanged right lower lobe nodules. 4. No evidence of lymphadenopathy or metastatic disease in the chest. 5. Emphysema. 6. Coronary artery disease. 7. Hepatic steatosis.  Aortic Atherosclerosis (ICD10-I70.0) and Emphysema (ICD10-J43.9).  Electronically Signed   By: Marolyn JONETTA Jaksch M.D.   On: 05/21/2024 07:41

## 2024-05-25 NOTE — Assessment & Plan Note (Addendum)
 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  Currently on consolidation durvalumab .  - Patient reports some rash with immunotherapy.  No other side effects - Continue durvalumab  1500 mg every 4 weeks - Labs reviewed today: CMP: WNL, CBC: Mild anemia but improved, TSH: Normal -We reviewed recent CT scan findings.  Looks more or less stable with stable lung nodules.  Repeat CT chest in 3 months. Due 07/2024  Return to clinic in 1 month for follow-up

## 2024-05-25 NOTE — Progress Notes (Signed)
 Patient presents today for Imfinzi  infusion. Patient is in satisfactory condition with no new complaints voiced.  Vital signs are stable.  Labs reviewed by Dr. Davonna during the office visit and all labs are within treatment parameters.  We will proceed with treatment per MD orders.   Patient tolerated treatment well with no complaints voiced.  Patient left via wheelchair with wife in stable condition.  Vital signs stable at discharge.  Follow up as scheduled.

## 2024-05-25 NOTE — Progress Notes (Signed)
 Patient has been examined by Dr. Davonna. Vital signs and labs have been reviewed by MD - ANC, Creatinine, LFTs, hemoglobin, and platelets have been reviewed by M.D. - pt may proceed with treatment.  Primary RN and pharmacy notified.

## 2024-05-25 NOTE — Assessment & Plan Note (Addendum)
 Baseline numbness in the fingertips of hands, right more than left from stroke is stable.

## 2024-05-25 NOTE — Patient Instructions (Signed)
 [facility] - A DEPT OF [facility]  Discharge Instructions: Thank you for choosing [facility] to provide your oncology and hematology care.  If you have a lab appointment with the Cancer Center - please note that after April 8th, 2024, all labs will be drawn in the cancer center.  You do not have to check in or register with the main entrance as you have in the past but will complete your check-in in the cancer center.  Wear comfortable clothing and clothing appropriate for easy access to any Portacath or PICC line.   We strive to give you quality time with your provider. You may need to reschedule your appointment if you arrive late (15 or more minutes).  Arriving late affects you and other patients whose appointments are after yours.  Also, if you miss three or more appointments without notifying the office, you may be dismissed from the clinic at the provider's discretion.      For prescription refill requests, have your pharmacy contact our office and allow 72 hours for refills to be completed.    Today you received the following chemotherapy and/or immunotherapy agents Imfinzi.  Durvalumab Injection What is this medication? DURVALUMAB (dur VAL ue mab) treats some types of cancer. It works by helping your immune system slow or stop the spread of cancer cells. It is a monoclonal antibody. This medicine may be used for other purposes; ask your health care provider or pharmacist if you have questions. COMMON BRAND NAME(S): IMFINZI What should I tell my care team before I take this medication? They need to know if you have any of these conditions: Allogeneic stem cell transplant (uses someone else's stem cells) Autoimmune diseases, such as Crohn disease, ulcerative colitis, lupus History of chest radiation Nervous system problems, such as Guillain-Barre syndrome, myasthenia gravis Organ transplant An unusual or allergic reaction to durvalumab,  other medications, foods, dyes, or preservatives Pregnant or trying to get pregnant Breast-feeding How should I use this medication? This medication is infused into a vein. It is given by your care team in a hospital or clinic setting. A special MedGuide will be given to you before each treatment. Be sure to read this information carefully each time. Talk to your care team about the use of this medication in children. Special care may be needed. Overdosage: If you think you have taken too much of this medicine contact a poison control center or emergency room at once. NOTE: This medicine is only for you. Do not share this medicine with others. What if I miss a dose? Keep appointments for follow-up doses. It is important not to miss your dose. Call your care team if you are unable to keep an appointment. What may interact with this medication? Interactions have not been studied. This list may not describe all possible interactions. Give your health care provider a list of all the medicines, herbs, non-prescription drugs, or dietary supplements you use. Also tell them if you smoke, drink alcohol, or use illegal drugs. Some items may interact with your medicine. What should I watch for while using this medication? Your condition will be monitored carefully while you are receiving this medication. You may need blood work while taking this medication. This medication may cause serious skin reactions. They can happen weeks to months after starting the medication. Contact your care team right away if you notice fevers or flu-like symptoms with a rash. The rash may be red or  purple and then turn into blisters or peeling of the skin. You may also notice a red rash with swelling of the face, lips, or lymph nodes in your neck or under your arms. Tell your care team right away if you have any change in your eyesight. Talk to your care team if you may be pregnant. Serious birth defects can occur if you take  this medication during pregnancy and for 3 months after the last dose. You will need a negative pregnancy test before starting this medication. Contraception is recommended while taking this medication and for 3 months after the last dose. Your care team can help you find the option that works for you. Do not breastfeed while taking this medication and for 3 months after the last dose. What side effects may I notice from receiving this medication? Side effects that you should report to your care team as soon as possible: Allergic reactions--skin rash, itching, hives, swelling of the face, lips, tongue, or throat Dry cough, shortness of breath or trouble breathing Eye pain, redness, irritation, or discharge with blurry or decreased vision Heart muscle inflammation--unusual weakness or fatigue, shortness of breath, chest pain, fast or irregular heartbeat, dizziness, swelling of the ankles, feet, or hands Hormone gland problems--headache, sensitivity to light, unusual weakness or fatigue, dizziness, fast or irregular heartbeat, increased sensitivity to cold or heat, excessive sweating, constipation, hair loss, increased thirst or amount of urine, tremors or shaking, irritability Infusion reactions--chest pain, shortness of breath or trouble breathing, feeling faint or lightheaded Kidney injury (glomerulonephritis)--decrease in the amount of urine, red or dark brown urine, foamy or bubbly urine, swelling of the ankles, hands, or feet Liver injury--right upper belly pain, loss of appetite, nausea, light-colored stool, dark yellow or brown urine, yellowing skin or eyes, unusual weakness or fatigue Pain, tingling, or numbness in the hands or feet, muscle weakness, change in vision, confusion or trouble speaking, loss of balance or coordination, trouble walking, seizures Rash, fever, and swollen lymph nodes Redness, blistering, peeling, or loosening of the skin, including inside the mouth Sudden or severe  stomach pain, bloody diarrhea, fever, nausea, vomiting Side effects that usually do not require medical attention (report these to your care team if they continue or are bothersome): Bone, joint, or muscle pain Diarrhea Fatigue Loss of appetite Nausea Skin rash This list may not describe all possible side effects. Call your doctor for medical advice about side effects. You may report side effects to FDA at 1-800-FDA-1088. Where should I keep my medication? This medication is given in a hospital or clinic. It will not be stored at home. NOTE: This sheet is a summary. It may not cover all possible information. If you have questions about this medicine, talk to your doctor, pharmacist, or health care provider.  2024 Elsevier/Gold Standard (2022-01-29 00:00:00)        To help prevent nausea and vomiting after your treatment, we encourage you to take your nausea medication as directed.  BELOW ARE SYMPTOMS THAT SHOULD BE REPORTED IMMEDIATELY: *FEVER GREATER THAN 100.4 F (38 C) OR HIGHER *CHILLS OR SWEATING *NAUSEA AND VOMITING THAT IS NOT CONTROLLED WITH YOUR NAUSEA MEDICATION *UNUSUAL SHORTNESS OF BREATH *UNUSUAL BRUISING OR BLEEDING *URINARY PROBLEMS (pain or burning when urinating, or frequent urination) *BOWEL PROBLEMS (unusual diarrhea, constipation, pain near the anus) TENDERNESS IN MOUTH AND THROAT WITH OR WITHOUT PRESENCE OF ULCERS (sore throat, sores in mouth, or a toothache) UNUSUAL RASH, SWELLING OR PAIN  UNUSUAL VAGINAL DISCHARGE OR ITCHING   Items  with * indicate a potential emergency and should be followed up as soon as possible or go to the Emergency Department if any problems should occur.  Please show the CHEMOTHERAPY ALERT CARD or IMMUNOTHERAPY ALERT CARD at check-in to the Emergency Department and triage nurse.  Should you have questions after your visit or need to cancel or reschedule your appointment, please contact [facility] [place] - A DEPT OF [facility]igh[facility] [phone]  and follow the prompts.  Office hours are 8:00 a.m. to 4:30 p.m. Monday - Friday. Please note that voicemails left after 4:00 p.m. may not be returned until the following business day.  We are closed weekends and major holidays. You have access to a nurse at all times for urgent questions. Please call the main number to the clinic [phone] and follow the prompts.  For any non-urgent questions, you may also contact your provider using MyChart. We now offer e-Visits for anyone 68 and older to request care online for non-urgent symptoms. For details visit [email or web address].   Also download the MyChart app! Go to the app store, search "MyChart", open the app, select [place], and log in with your MyChart username and password.

## 2024-05-26 LAB — T4: T4, Total: 8.1 ug/dL (ref 4.5–12.0)

## 2024-05-27 ENCOUNTER — Other Ambulatory Visit: Payer: Self-pay

## 2024-05-27 ENCOUNTER — Encounter: Payer: Self-pay | Admitting: Internal Medicine

## 2024-05-27 DIAGNOSIS — J449 Chronic obstructive pulmonary disease, unspecified: Secondary | ICD-10-CM

## 2024-05-27 DIAGNOSIS — N401 Enlarged prostate with lower urinary tract symptoms: Secondary | ICD-10-CM

## 2024-05-27 MED ORDER — FINASTERIDE 5 MG PO TABS: 5.0000 mg | ORAL_TABLET | Freq: Every day | ORAL | 0 refills | Status: DC

## 2024-05-27 MED ORDER — IPRATROPIUM-ALBUTEROL 0.5-2.5 (3) MG/3ML IN SOLN: RESPIRATORY_TRACT | 0 refills | Status: DC

## 2024-06-01 ENCOUNTER — Encounter

## 2024-06-08 ENCOUNTER — Encounter

## 2024-06-09 ENCOUNTER — Encounter: Payer: Self-pay | Admitting: Oncology

## 2024-06-09 ENCOUNTER — Encounter: Payer: Self-pay | Admitting: Cardiology

## 2024-06-15 ENCOUNTER — Ambulatory Visit: Attending: Internal Medicine | Admitting: *Deleted

## 2024-06-15 DIAGNOSIS — Z5181 Encounter for therapeutic drug level monitoring: Secondary | ICD-10-CM | POA: Insufficient documentation

## 2024-06-15 DIAGNOSIS — I480 Paroxysmal atrial fibrillation: Secondary | ICD-10-CM | POA: Diagnosis not present

## 2024-06-15 LAB — POCT INR: INR: 2.9 (ref 2.0–3.0)

## 2024-06-15 NOTE — Progress Notes (Signed)
 INR 2.9; Please see anticoagulation encounter

## 2024-06-15 NOTE — Patient Instructions (Signed)
 Continue warfarin 1/2 tablet daily except 1 tablet on Mondays, Wednesdays and Fridays.  Continue greens/salads  Recheck INR in 6 weeks.  Finished chemo and radiation

## 2024-06-16 ENCOUNTER — Other Ambulatory Visit: Payer: Self-pay | Admitting: Oncology

## 2024-06-16 DIAGNOSIS — C3490 Malignant neoplasm of unspecified part of unspecified bronchus or lung: Secondary | ICD-10-CM

## 2024-06-21 ENCOUNTER — Other Ambulatory Visit: Payer: Self-pay

## 2024-06-21 ENCOUNTER — Emergency Department (HOSPITAL_COMMUNITY)

## 2024-06-21 ENCOUNTER — Encounter (HOSPITAL_COMMUNITY): Payer: Self-pay | Admitting: Emergency Medicine

## 2024-06-21 ENCOUNTER — Inpatient Hospital Stay (HOSPITAL_COMMUNITY)
Admission: EM | Admit: 2024-06-21 | Discharge: 2024-06-26 | DRG: 308 | Disposition: A | Discharge status: Home Health | Attending: Family Medicine | Admitting: Family Medicine

## 2024-06-21 DIAGNOSIS — I714 Abdominal aortic aneurysm, without rupture, unspecified: Secondary | ICD-10-CM | POA: Diagnosis present

## 2024-06-21 DIAGNOSIS — J439 Emphysema, unspecified: Secondary | ICD-10-CM | POA: Diagnosis present

## 2024-06-21 DIAGNOSIS — R351 Nocturia: Secondary | ICD-10-CM | POA: Diagnosis present

## 2024-06-21 DIAGNOSIS — J9620 Acute and chronic respiratory failure, unspecified whether with hypoxia or hypercapnia: Secondary | ICD-10-CM | POA: Diagnosis present

## 2024-06-21 DIAGNOSIS — I4819 Other persistent atrial fibrillation: Principal | ICD-10-CM | POA: Diagnosis present

## 2024-06-21 DIAGNOSIS — Z9221 Personal history of antineoplastic chemotherapy: Secondary | ICD-10-CM

## 2024-06-21 DIAGNOSIS — I4891 Unspecified atrial fibrillation: Principal | ICD-10-CM | POA: Diagnosis present

## 2024-06-21 DIAGNOSIS — J90 Pleural effusion, not elsewhere classified: Secondary | ICD-10-CM | POA: Diagnosis not present

## 2024-06-21 DIAGNOSIS — I4892 Unspecified atrial flutter: Secondary | ICD-10-CM | POA: Diagnosis not present

## 2024-06-21 DIAGNOSIS — R7989 Other specified abnormal findings of blood chemistry: Secondary | ICD-10-CM | POA: Diagnosis present

## 2024-06-21 DIAGNOSIS — J189 Pneumonia, unspecified organism: Secondary | ICD-10-CM | POA: Diagnosis not present

## 2024-06-21 DIAGNOSIS — L239 Allergic contact dermatitis, unspecified cause: Secondary | ICD-10-CM | POA: Diagnosis present

## 2024-06-21 DIAGNOSIS — Z85118 Personal history of other malignant neoplasm of bronchus and lung: Secondary | ICD-10-CM | POA: Diagnosis not present

## 2024-06-21 DIAGNOSIS — Z87891 Personal history of nicotine dependence: Secondary | ICD-10-CM

## 2024-06-21 DIAGNOSIS — E1169 Type 2 diabetes mellitus with other specified complication: Secondary | ICD-10-CM | POA: Diagnosis not present

## 2024-06-21 DIAGNOSIS — I483 Typical atrial flutter: Secondary | ICD-10-CM | POA: Diagnosis present

## 2024-06-21 DIAGNOSIS — Z9981 Dependence on supplemental oxygen: Secondary | ICD-10-CM | POA: Diagnosis not present

## 2024-06-21 DIAGNOSIS — J441 Chronic obstructive pulmonary disease with (acute) exacerbation: Secondary | ICD-10-CM | POA: Diagnosis present

## 2024-06-21 DIAGNOSIS — E785 Hyperlipidemia, unspecified: Secondary | ICD-10-CM | POA: Diagnosis present

## 2024-06-21 DIAGNOSIS — R000 Tachycardia, unspecified: Secondary | ICD-10-CM | POA: Diagnosis present

## 2024-06-21 DIAGNOSIS — I10 Essential (primary) hypertension: Secondary | ICD-10-CM | POA: Diagnosis present

## 2024-06-21 DIAGNOSIS — Z902 Acquired absence of lung [part of]: Secondary | ICD-10-CM

## 2024-06-21 DIAGNOSIS — Z8261 Family history of arthritis: Secondary | ICD-10-CM

## 2024-06-21 DIAGNOSIS — I2510 Atherosclerotic heart disease of native coronary artery without angina pectoris: Secondary | ICD-10-CM | POA: Diagnosis present

## 2024-06-21 DIAGNOSIS — E1140 Type 2 diabetes mellitus with diabetic neuropathy, unspecified: Secondary | ICD-10-CM | POA: Diagnosis present

## 2024-06-21 DIAGNOSIS — G4730 Sleep apnea, unspecified: Secondary | ICD-10-CM | POA: Diagnosis present

## 2024-06-21 DIAGNOSIS — J440 Chronic obstructive pulmonary disease with acute lower respiratory infection: Secondary | ICD-10-CM | POA: Diagnosis present

## 2024-06-21 DIAGNOSIS — I480 Paroxysmal atrial fibrillation: Secondary | ICD-10-CM | POA: Diagnosis not present

## 2024-06-21 DIAGNOSIS — I7143 Infrarenal abdominal aortic aneurysm, without rupture: Secondary | ICD-10-CM | POA: Diagnosis present

## 2024-06-21 DIAGNOSIS — Z7901 Long term (current) use of anticoagulants: Secondary | ICD-10-CM

## 2024-06-21 DIAGNOSIS — Z87442 Personal history of urinary calculi: Secondary | ICD-10-CM

## 2024-06-21 DIAGNOSIS — G629 Polyneuropathy, unspecified: Secondary | ICD-10-CM

## 2024-06-21 DIAGNOSIS — E119 Type 2 diabetes mellitus without complications: Secondary | ICD-10-CM | POA: Diagnosis present

## 2024-06-21 DIAGNOSIS — E1151 Type 2 diabetes mellitus with diabetic peripheral angiopathy without gangrene: Secondary | ICD-10-CM | POA: Diagnosis present

## 2024-06-21 DIAGNOSIS — Z923 Personal history of irradiation: Secondary | ICD-10-CM | POA: Diagnosis not present

## 2024-06-21 DIAGNOSIS — I700 Atherosclerosis of aorta: Secondary | ICD-10-CM | POA: Diagnosis present

## 2024-06-21 DIAGNOSIS — R0602 Shortness of breath: Secondary | ICD-10-CM | POA: Diagnosis not present

## 2024-06-21 DIAGNOSIS — Z825 Family history of asthma and other chronic lower respiratory diseases: Secondary | ICD-10-CM

## 2024-06-21 DIAGNOSIS — E782 Mixed hyperlipidemia: Secondary | ICD-10-CM | POA: Diagnosis present

## 2024-06-21 DIAGNOSIS — Z79899 Other long term (current) drug therapy: Secondary | ICD-10-CM | POA: Diagnosis not present

## 2024-06-21 DIAGNOSIS — G4700 Insomnia, unspecified: Secondary | ICD-10-CM | POA: Diagnosis present

## 2024-06-21 DIAGNOSIS — I6930 Unspecified sequelae of cerebral infarction: Secondary | ICD-10-CM | POA: Diagnosis not present

## 2024-06-21 DIAGNOSIS — C3490 Malignant neoplasm of unspecified part of unspecified bronchus or lung: Secondary | ICD-10-CM | POA: Diagnosis not present

## 2024-06-21 DIAGNOSIS — N401 Enlarged prostate with lower urinary tract symptoms: Secondary | ICD-10-CM | POA: Diagnosis not present

## 2024-06-21 DIAGNOSIS — J449 Chronic obstructive pulmonary disease, unspecified: Secondary | ICD-10-CM | POA: Diagnosis not present

## 2024-06-21 DIAGNOSIS — Z8673 Personal history of transient ischemic attack (TIA), and cerebral infarction without residual deficits: Secondary | ICD-10-CM

## 2024-06-21 LAB — CBC
HCT: 38.2 % — ABNORMAL LOW (ref 39.0–52.0)
Hemoglobin: 11.8 g/dL — ABNORMAL LOW (ref 13.0–17.0)
MCH: 25.3 pg — ABNORMAL LOW (ref 26.0–34.0)
MCHC: 30.9 g/dL (ref 30.0–36.0)
MCV: 82 fL (ref 80.0–100.0)
Platelets: 212 K/uL (ref 150–400)
RBC: 4.66 MIL/uL (ref 4.22–5.81)
RDW: 15.6 % — ABNORMAL HIGH (ref 11.5–15.5)
WBC: 10.3 K/uL (ref 4.0–10.5)
nRBC: 0 % (ref 0.0–0.2)

## 2024-06-21 LAB — BASIC METABOLIC PANEL WITH GFR
Anion gap: 15 (ref 5–15)
BUN: 21 mg/dL (ref 8–23)
CO2: 23 mmol/L (ref 22–32)
Calcium: 9 mg/dL (ref 8.9–10.3)
Chloride: 96 mmol/L — ABNORMAL LOW (ref 98–111)
Creatinine, Ser: 1.13 mg/dL (ref 0.61–1.24)
GFR, Estimated: >60 mL/min (ref >60)
Glucose, Bld: 120 mg/dL — ABNORMAL HIGH (ref 70–99)
Potassium: 4.3 mmol/L (ref 3.5–5.1)
Sodium: 134 mmol/L — ABNORMAL LOW (ref 135–145)

## 2024-06-21 LAB — URINALYSIS, ROUTINE W REFLEX MICROSCOPIC
Bilirubin Urine: NEGATIVE
Glucose, UA: NEGATIVE mg/dL
Hgb urine dipstick: NEGATIVE
Ketones, ur: NEGATIVE mg/dL
Leukocytes,Ua: NEGATIVE
Nitrite: NEGATIVE
Protein, ur: NEGATIVE mg/dL
Specific Gravity, Urine: 1.025 (ref 1.005–1.030)
pH: 5 (ref 5.0–8.0)

## 2024-06-21 LAB — RESP PANEL BY RT-PCR (RSV, FLU A&B, COVID)  RVPGX2
Influenza A by PCR: NEGATIVE
Influenza B by PCR: NEGATIVE
Resp Syncytial Virus by PCR: NEGATIVE
SARS Coronavirus 2 by RT PCR: NEGATIVE

## 2024-06-21 LAB — PHOSPHORUS: Phosphorus: 2.4 mg/dL — ABNORMAL LOW (ref 2.5–4.6)

## 2024-06-21 LAB — PROTIME-INR
INR: 2.3 — ABNORMAL HIGH (ref 0.8–1.2)
Prothrombin Time: 26.8 s — ABNORMAL HIGH (ref 11.4–15.2)

## 2024-06-21 LAB — LACTIC ACID, PLASMA: Lactic Acid, Venous: 1 mmol/L (ref 0.5–1.9)

## 2024-06-21 LAB — TROPONIN I (HIGH SENSITIVITY): Troponin I (High Sensitivity): 16 ng/L (ref <18)

## 2024-06-21 LAB — BRAIN NATRIURETIC PEPTIDE: B Natriuretic Peptide: 226 pg/mL — ABNORMAL HIGH (ref 0.0–100.0)

## 2024-06-21 LAB — PROCALCITONIN: Procalcitonin: 0.1 ng/mL

## 2024-06-21 LAB — CBG MONITORING, ED: Glucose-Capillary: 133 mg/dL — ABNORMAL HIGH (ref 70–99)

## 2024-06-21 LAB — MAGNESIUM: Magnesium: 1.7 mg/dL (ref 1.7–2.4)

## 2024-06-21 LAB — GLUCOSE, CAPILLARY: Glucose-Capillary: 185 mg/dL — ABNORMAL HIGH (ref 70–99)

## 2024-06-21 MED ORDER — IPRATROPIUM BROMIDE 0.02 % IN SOLN
0.5000 mg | Freq: Four times a day (QID) | RESPIRATORY_TRACT | Status: DC
  Administered 2024-06-21 – 2024-06-24 (×10): 0.5 mg via RESPIRATORY_TRACT
  Filled 2024-06-21 (×11): qty 2.5

## 2024-06-21 MED ORDER — IPRATROPIUM BROMIDE 0.02 % IN SOLN: 0.5000 mg | Freq: Four times a day (QID) | RESPIRATORY_TRACT | Status: DC | PRN

## 2024-06-21 MED ORDER — ONDANSETRON HCL 4 MG PO TABS: 4.0000 mg | ORAL_TABLET | Freq: Four times a day (QID) | ORAL | Status: DC | PRN

## 2024-06-21 MED ORDER — LEVALBUTEROL HCL 1.25 MG/0.5ML IN NEBU
1.2500 mg | INHALATION_SOLUTION | Freq: Four times a day (QID) | RESPIRATORY_TRACT | Status: DC
  Administered 2024-06-21 – 2024-06-24 (×10): 1.25 mg via RESPIRATORY_TRACT
  Filled 2024-06-21 (×11): qty 0.5

## 2024-06-21 MED ORDER — TRAZODONE HCL 50 MG PO TABS: 25.0000 mg | ORAL_TABLET | Freq: Every evening | ORAL | Status: DC | PRN

## 2024-06-21 MED ORDER — DILTIAZEM HCL-DEXTROSE 125-5 MG/125ML-% IV SOLN (PREMIX)
5.0000 mg/h | INTRAVENOUS | Status: DC
  Administered 2024-06-21: 5 mg/h via INTRAVENOUS
  Filled 2024-06-21: qty 125

## 2024-06-21 MED ORDER — IPRATROPIUM BROMIDE 0.02 % IN SOLN
0.5000 mg | Freq: Once | RESPIRATORY_TRACT | Status: AC
  Administered 2024-06-21: 0.5 mg via RESPIRATORY_TRACT
  Filled 2024-06-21: qty 2.5

## 2024-06-21 MED ORDER — SODIUM CHLORIDE 0.9% FLUSH
3.0000 mL | Freq: Two times a day (BID) | INTRAVENOUS | Status: DC
  Administered 2024-06-21 (×2): 3 mL via INTRAVENOUS

## 2024-06-21 MED ORDER — LEVOFLOXACIN 750 MG PO TABS
750.0000 mg | ORAL_TABLET | Freq: Every day | ORAL | Status: DC
  Administered 2024-06-22 – 2024-06-26 (×5): 750 mg via ORAL
  Filled 2024-06-21 (×5): qty 1

## 2024-06-21 MED ORDER — ALBUTEROL SULFATE (2.5 MG/3ML) 0.083% IN NEBU
5.0000 mg | INHALATION_SOLUTION | Freq: Once | RESPIRATORY_TRACT | Status: AC
  Administered 2024-06-21: 5 mg via RESPIRATORY_TRACT
  Filled 2024-06-21: qty 6

## 2024-06-21 MED ORDER — SODIUM CHLORIDE 0.9 % IV SOLN
1.0000 g | Freq: Once | INTRAVENOUS | Status: AC
  Administered 2024-06-21: 1 g via INTRAVENOUS
  Filled 2024-06-21: qty 10

## 2024-06-21 MED ORDER — FENOFIBRATE 160 MG PO TABS
160.0000 mg | ORAL_TABLET | Freq: Every day | ORAL | Status: DC
  Administered 2024-06-22 – 2024-06-26 (×5): 160 mg via ORAL
  Filled 2024-06-21 (×5): qty 1

## 2024-06-21 MED ORDER — HEPARIN SODIUM (PORCINE) 5000 UNIT/ML IJ SOLN: 5000.0000 [IU] | Freq: Three times a day (TID) | INTRAMUSCULAR | Status: DC

## 2024-06-21 MED ORDER — ALBUTEROL SULFATE (2.5 MG/3ML) 0.083% IN NEBU
5.0000 mg | INHALATION_SOLUTION | Freq: Once | RESPIRATORY_TRACT | Status: DC
Start: 2024-06-21 — End: 2024-06-22
  Filled 2024-06-21: qty 6

## 2024-06-21 MED ORDER — OXYCODONE HCL 5 MG PO TABS
5.0000 mg | ORAL_TABLET | ORAL | Status: DC | PRN
  Administered 2024-06-24: 5 mg via ORAL
  Filled 2024-06-21: qty 1

## 2024-06-21 MED ORDER — GUAIFENESIN-DM 100-10 MG/5ML PO SYRP
10.0000 mL | ORAL_SOLUTION | Freq: Three times a day (TID) | ORAL | Status: DC
  Administered 2024-06-21 – 2024-06-26 (×13): 10 mL via ORAL
  Filled 2024-06-21 (×15): qty 10

## 2024-06-21 MED ORDER — METHYLPREDNISOLONE SODIUM SUCC 125 MG IJ SOLR
80.0000 mg | Freq: Two times a day (BID) | INTRAMUSCULAR | Status: DC
  Administered 2024-06-21 – 2024-06-22 (×3): 80 mg via INTRAVENOUS
  Filled 2024-06-21 (×2): qty 2

## 2024-06-21 MED ORDER — INSULIN ASPART 100 UNIT/ML IJ SOLN
0.0000 [IU] | Freq: Three times a day (TID) | INTRAMUSCULAR | Status: DC
  Administered 2024-06-21 – 2024-06-22 (×2): 3 [IU] via SUBCUTANEOUS
  Administered 2024-06-22: 7 [IU] via SUBCUTANEOUS
  Administered 2024-06-22: 5 [IU] via SUBCUTANEOUS
  Administered 2024-06-23: 4 [IU] via SUBCUTANEOUS
  Administered 2024-06-23 (×2): 3 [IU] via SUBCUTANEOUS
  Administered 2024-06-24: 7 [IU] via SUBCUTANEOUS
  Administered 2024-06-24: 3 [IU] via SUBCUTANEOUS
  Administered 2024-06-24: 4 [IU] via SUBCUTANEOUS
  Administered 2024-06-25 (×2): 3 [IU] via SUBCUTANEOUS
  Administered 2024-06-26: 4 [IU] via SUBCUTANEOUS
  Filled 2024-06-21: qty 1

## 2024-06-21 MED ORDER — WARFARIN SODIUM 2.5 MG PO TABS: 2.5000 mg | ORAL_TABLET | ORAL | Status: DC

## 2024-06-21 MED ORDER — LACTATED RINGERS IV BOLUS
500.0000 mL | Freq: Once | INTRAVENOUS | Status: AC
  Administered 2024-06-21: 500 mL via INTRAVENOUS

## 2024-06-21 MED ORDER — TAMSULOSIN HCL 0.4 MG PO CAPS
0.8000 mg | ORAL_CAPSULE | Freq: Every day | ORAL | Status: DC
  Administered 2024-06-22 – 2024-06-26 (×5): 0.8 mg via ORAL
  Filled 2024-06-21 (×5): qty 2

## 2024-06-21 MED ORDER — SODIUM CHLORIDE 0.9 % IV SOLN
INTRAVENOUS | Status: DC
Start: 2024-06-21 — End: 2024-06-21

## 2024-06-21 MED ORDER — METOPROLOL SUCCINATE ER 50 MG PO TB24
50.0000 mg | ORAL_TABLET | Freq: Every day | ORAL | Status: DC
Start: 2024-06-22 — End: 2024-06-22
  Administered 2024-06-22: 50 mg via ORAL
  Filled 2024-06-21: qty 1

## 2024-06-21 MED ORDER — ROSUVASTATIN CALCIUM 20 MG PO TABS
40.0000 mg | ORAL_TABLET | Freq: Every day | ORAL | Status: DC
  Administered 2024-06-22 – 2024-06-26 (×5): 40 mg via ORAL
  Filled 2024-06-21 (×5): qty 2

## 2024-06-21 MED ORDER — WARFARIN - PHARMACIST DOSING INPATIENT: Freq: Every day | Status: DC

## 2024-06-21 MED ORDER — HYDROXYZINE HCL 25 MG PO TABS: 25.0000 mg | ORAL_TABLET | Freq: Three times a day (TID) | ORAL | Status: DC | PRN

## 2024-06-21 MED ORDER — FLEET ENEMA RE ENEM: 1.0000 | ENEMA | Freq: Once | RECTAL | Status: DC | PRN

## 2024-06-21 MED ORDER — SENNOSIDES-DOCUSATE SODIUM 8.6-50 MG PO TABS: 1.0000 | ORAL_TABLET | Freq: Every evening | ORAL | Status: DC | PRN

## 2024-06-21 MED ORDER — ACETAMINOPHEN 650 MG RE SUPP: 650.0000 mg | Freq: Four times a day (QID) | RECTAL | Status: DC | PRN

## 2024-06-21 MED ORDER — ALPRAZOLAM 0.5 MG PO TABS: 0.5000 mg | ORAL_TABLET | Freq: Three times a day (TID) | ORAL | Status: DC | PRN

## 2024-06-21 MED ORDER — ACETAMINOPHEN 325 MG PO TABS
650.0000 mg | ORAL_TABLET | Freq: Four times a day (QID) | ORAL | Status: DC | PRN
  Administered 2024-06-24 – 2024-06-25 (×2): 650 mg via ORAL
  Filled 2024-06-21: qty 2

## 2024-06-21 MED ORDER — LEVALBUTEROL HCL 0.63 MG/3ML IN NEBU: 0.6300 mg | INHALATION_SOLUTION | Freq: Four times a day (QID) | RESPIRATORY_TRACT | Status: DC | PRN

## 2024-06-21 MED ORDER — BISACODYL 5 MG PO TBEC: 5.0000 mg | DELAYED_RELEASE_TABLET | Freq: Every day | ORAL | Status: DC | PRN

## 2024-06-21 MED ORDER — DILTIAZEM LOAD VIA INFUSION
10.0000 mg | Freq: Once | INTRAVENOUS | Status: AC
  Administered 2024-06-21: 10 mg via INTRAVENOUS
  Filled 2024-06-21: qty 10

## 2024-06-21 MED ORDER — METOPROLOL TARTRATE 5 MG/5ML IV SOLN
5.0000 mg | INTRAVENOUS | Status: DC | PRN
  Administered 2024-06-23 – 2024-06-24 (×2): 5 mg via INTRAVENOUS
  Filled 2024-06-21 (×3): qty 5

## 2024-06-21 MED ORDER — ORAL CARE MOUTH RINSE: 15.0000 mL | OROMUCOSAL | Status: DC | PRN

## 2024-06-21 MED ORDER — WARFARIN SODIUM 5 MG PO TABS
5.0000 mg | ORAL_TABLET | Freq: Once | ORAL | Status: AC
  Administered 2024-06-21: 5 mg via ORAL
  Filled 2024-06-21: qty 1

## 2024-06-21 MED ORDER — METHYLPREDNISOLONE SODIUM SUCC 125 MG IJ SOLR
125.0000 mg | Freq: Once | INTRAMUSCULAR | Status: DC
  Filled 2024-06-21: qty 2

## 2024-06-21 MED ORDER — MAGNESIUM OXIDE -MG SUPPLEMENT 400 (240 MG) MG PO TABS
400.0000 mg | ORAL_TABLET | Freq: Two times a day (BID) | ORAL | Status: DC
Start: 2024-06-21 — End: 2024-06-26
  Administered 2024-06-21 – 2024-06-26 (×10): 400 mg via ORAL
  Filled 2024-06-21 (×10): qty 1

## 2024-06-21 MED ORDER — HYDROMORPHONE HCL 1 MG/ML IJ SOLN
0.5000 mg | INTRAMUSCULAR | Status: DC | PRN
  Administered 2024-06-22: 1 mg via INTRAVENOUS
  Filled 2024-06-21: qty 1

## 2024-06-21 MED ORDER — AZITHROMYCIN 500 MG IV SOLR
500.0000 mg | Freq: Once | INTRAVENOUS | Status: AC
  Administered 2024-06-21: 500 mg via INTRAVENOUS
  Filled 2024-06-21: qty 5

## 2024-06-21 MED ORDER — BUDESONIDE 0.25 MG/2ML IN SUSP
0.2500 mg | Freq: Two times a day (BID) | RESPIRATORY_TRACT | Status: DC
  Administered 2024-06-21 – 2024-06-26 (×10): 0.25 mg via RESPIRATORY_TRACT
  Filled 2024-06-21 (×10): qty 2

## 2024-06-21 MED ORDER — HYDRALAZINE HCL 20 MG/ML IJ SOLN: 10.0000 mg | INTRAMUSCULAR | Status: DC | PRN

## 2024-06-21 MED ORDER — ONDANSETRON HCL 4 MG/2ML IJ SOLN: 4.0000 mg | Freq: Four times a day (QID) | INTRAMUSCULAR | Status: DC | PRN

## 2024-06-21 MED ORDER — LORATADINE 10 MG PO TABS
10.0000 mg | ORAL_TABLET | Freq: Every day | ORAL | Status: DC
  Administered 2024-06-22 – 2024-06-26 (×5): 10 mg via ORAL
  Filled 2024-06-21 (×5): qty 1

## 2024-06-21 MED ORDER — FINASTERIDE 5 MG PO TABS
5.0000 mg | ORAL_TABLET | Freq: Every day | ORAL | Status: DC
  Administered 2024-06-22 – 2024-06-26 (×5): 5 mg via ORAL
  Filled 2024-06-21 (×5): qty 1

## 2024-06-21 NOTE — Assessment & Plan Note (Signed)
-   Monitor closely, in respite distress due to pneumonia, COPD exacerbation

## 2024-06-21 NOTE — Assessment & Plan Note (Addendum)
 POA; in A-fib with RVR heart rate as high as 154 >> 87 With history of A-fib -Likely exacerbated by shortness of breath, COPD exacerbation - Started on Cardizem  drip -will titrate and taper off -Resume home medication: Including metoprolol  - Continue Coumadin  INR therapeutic 2.3

## 2024-06-21 NOTE — ED Notes (Signed)
 Provider notified of pt tachycardic (130-135).

## 2024-06-21 NOTE — Evaluation (Signed)
 Occupational Therapy Evaluation Patient Details Name: Randall Sullivan MRN: [id number] DOB: May 23, 1951 Today's Date: 06/21/2024   History of Present Illness   Randall Sullivan is a 73 year old with history of A-fib, on Coumadin , COPD,   AAA, lung cancer, HTN, HLD, PVD, prediabetic .[name]  Presenting to ED with chief complaint of shortness of breath, cough, and palpitation. Patient reports his symptoms has been progressively worsening in the past few days.  Denies any chest pain, denies any fever or chills, nausea or vomiting.  No excessive swelling in extremities (per MD)     Clinical Impressions Pt agreeable to PT and OT co-evaluation. Pt appears to be near baseline function for ADL's and mobility. Pt reportedly is unstable at baseline from a previous stroke. CGA needed for EOB to chair transfer. Pt mod I with bed mobility and demonstrates ability to complete ADL's without assist. Pt left in the chair with call bell within reach and family present. Pt is not recommended for any further acute OT services and will be discharged to care of nursing staff for remaining length of stay.         If plan is discharge home, recommend the following:   A little help with walking and/or transfers;Help with stairs or ramp for entrance;Assistance with cooking/housework     Functional Status Assessment   Patient has not had a recent decline in their functional status     Equipment Recommendations   None recommended by OT             Precautions/Restrictions   Precautions Precautions: Fall Recall of Precautions/Restrictions: Intact Restrictions Weight Bearing Restrictions Per Provider Order: No     Mobility Bed Mobility Overal bed mobility: Independent                  Transfers Overall transfer level: Needs assistance   Transfers: Sit to/from Stand, Bed to chair/wheelchair/BSC Sit to Stand: Contact guard assist     Step pivot transfers: Contact guard assist     General  transfer comment: Unsteady but family and pt report that is his baseline.      Balance Overall balance assessment: Needs assistance Sitting-balance support: No upper extremity supported, Feet supported Sitting balance-Leahy Scale: Good Sitting balance - Comments: seated at EOB   Standing balance support: During functional activity, No upper extremity supported Standing balance-Leahy Scale: Fair Standing balance comment: poor to fair without AD                           ADL either performed or assessed with clinical judgement   ADL Overall ADL's : Modified independent                                       General ADL Comments: Unsteady in standing but pt uses cane at baseline. Good ability to reach B LE for lower body tasks. WFL B UE functional use.     Vision Baseline Vision/History:  (L peripheral vision deficit from previous stroke.) Ability to See in Adequate Light: 2 Moderately impaired Patient Visual Report: No change from baseline Vision Assessment?:  (Baseline deficits.)     Perception Perception: Not tested       Praxis Praxis: Not tested       Pertinent Vitals/Pain Pain Assessment Pain Assessment: No/denies pain     Extremity/Trunk Assessment Upper Extremity Assessment Upper Extremity Assessment:  Overall [facility] for tasks assessed   Lower Extremity Assessment Lower Extremity Assessment: Defer to PT evaluation   Cervical / Trunk Assessment Cervical / Trunk Assessment: Normal   Communication Communication Communication: No apparent difficulties   Cognition Arousal: Alert Behavior During Therapy: WFL for tasks assessed/performed Cognition: No apparent impairments                               Following commands: Intact       Cueing  General Comments   Cueing Techniques: Verbal cues;Tactile cues                 Home Living Family/patient expects to be discharged to:: Private residence Living  Arrangements: Spouse/significant other Available Help at Discharge: Family;Available PRN/intermittently Type of Home: House Home Access: Stairs to enter [employer] of Steps: 4 Entrance Stairs-Rails: Left;Right;Can reach both Home Layout: One level     Bathroom Shower/Tub: Chief Strategy Officer: Standard Bathroom Accessibility: Yes How Accessible: Accessible via walker Home Equipment: Cane - single point;Shower Counsellor (2 wheels);Grab bars - tub/shower          Prior Functioning/Environment Prior Level of Function : Needs assist       Physical Assist : ADLs (physical)   ADLs (physical): IADLs Mobility Comments: Ambulates without AD inside and uses SPC when outside. Uses supplemental O2 at baseline. ADLs Comments: Independent ADL; assist IADL's.                            Co-evaluation PT/OT/SLP Co-Evaluation/Treatment: Yes Reason for Co-Treatment: To address functional/ADL transfers   OT goals addressed during session: ADL's and self-care                       End of Session Equipment Utilized During Treatment: Oxygen  Nurse Communication: Mobility status  Activity Tolerance: Patient tolerated treatment well Patient left: in chair;with call bell/phone within reach;with family/visitor present  OT Visit Diagnosis: Unsteadiness on feet (R26.81);Muscle weakness (generalized) (M62.81)                Time: 8549-8496 OT Time Calculation (min): 13 min Charges:  OT General Charges $OT Visit: 1 Visit OT Evaluation $OT Eval Low Complexity: 1 Low  Curtez Brallier OT, MOT  Jayson Person 06/21/2024, 3:38 PM

## 2024-06-21 NOTE — ED Provider Notes (Signed)
 [facility] AT [facility] Provider Note   CSN: [id number] Arrival date & time: 06/21/24  1105     Patient presents with: Shortness of Breath   Randall Sullivan is a 73 y.o. male.  {Add pertinent medical, surgical, social history, OB history to YEP:67052} Patient with hx afib, c/o feeling sob in past few days. Occasional non prod cough. No sore throat. No fever or chills. Denies chest pain. No new or worsening leg swelling or pain. Compliant w home meds. Hx afib, on anticoag therapy, denies being aware of palpitations or irregular breathing.   The history is provided by the patient, medical records and a relative.  Shortness of Breath Associated symptoms: cough   Associated symptoms: no abdominal pain, no chest pain, no diaphoresis, no fever, no headaches, no neck pain, no sore throat and no vomiting        Prior to Admission medications   Medication Sig Start Date End Date Taking? Authorizing Provider  acetaminophen  (TYLENOL ) 325 MG tablet Take 2 tablets (650 mg total) by mouth every 6 (six) hours as needed for mild pain (or Fever >/= 101). 03/22/21   Angiulli, Toribio PARAS, PA-C  ALPRAZolam  (XANAX ) 0.5 MG tablet Take 0.5 mg by mouth daily. 12/04/23   [provider]  aluminum -magnesium  hydroxide 200-200 MG/5ML suspension Take 15 mLs by mouth every 6 (six) hours as needed for indigestion (Mix 1:1 with lidocaine  and swish and spit). 01/27/24   Rogers Hai, MD  betamethasone  dipropionate 0.05 % cream Apply topically 2 (two) times daily. 03/30/24   Katragadda, Sreedhar, MD  budesonide -glycopyrrolate -formoterol  (BREZTRI  AEROSPHERE) 160-9-4.8 MCG/ACT AERO inhaler Inhale 2 puffs into the lungs in the morning and at bedtime. 04/08/24   Tobie Suzzane POUR, MD  cetirizine  (ZYRTEC ) 10 MG tablet Take 10 mg by mouth daily.    [provider]  Cholecalciferol  (VITAMIN D3) 125 MCG (5000 UT) CAPS Take 1 capsule (5,000 Units total) by mouth daily. 03/22/21    Angiulli, Toribio PARAS, PA-C  fenofibrate  (TRICOR ) 145 MG tablet Take 1 tablet (145 mg total) by mouth daily. 12/29/23   Tobie Suzzane POUR, MD  finasteride  (PROSCAR ) 5 MG tablet Take 1 tablet (5 mg total) by mouth daily. 05/27/24   Tobie Suzzane POUR, MD  guaiFENesin  (MUCINEX ) 600 MG 12 hr tablet Take by mouth 2 (two) times daily.    [provider]  hydrOXYzine  (ATARAX ) 25 MG tablet Take 1 tablet (25 mg total) by mouth every 8 (eight) hours as needed for itching. 05/11/24   Tobie Suzzane POUR, MD  ipratropium-albuterol  (DUONEB) 0.5-2.5 (3) MG/3ML SOLN USE 1 AMPULE IN NEBULIZER 4 TIMES DAILY AND AS NEEDED FOR SHORTNESS OF BREATH AND FOR WHEEZING 05/27/24   Tobie Suzzane POUR, MD  lidocaine  (XYLOCAINE ) 2 % solution Use as directed 15 mLs in the mouth or throat every 6 (six) hours as needed for mouth pain (Mix 1:1 with Maalox and swish and spit). 01/27/24   Rogers Hai, MD  lidocaine -prilocaine  (EMLA ) cream Apply 1 Application topically as needed. 04/27/24   Rogers Hai, MD  magnesium  oxide (MAG-OX) 400 (240 Mg) MG tablet Take 1 tablet (400 mg total) by mouth 2 (two) times daily. 01/27/24   Rogers Hai, MD  meclizine  (ANTIVERT ) 25 MG tablet Take 1 tablet (25 mg total) by mouth 3 (three) times daily as needed for dizziness. 09/16/23   Ricky Fines, MD  metoprolol  succinate (TOPROL  XL) 50 MG 24 hr tablet Take 1 tablet (50 mg total) by mouth daily. Take  with or immediately following a meal. 03/26/24 03/26/25  Tobie Suzzane POUR, MD  Omega-3 Fatty Acids (FISH OIL ULTRA PO) Take 1,200 mg by mouth in the morning and at bedtime.    [provider]  OXYGEN  Inhale 2 L into the lungs continuous. Hx of lung ca, copd, and emphysema    [provider]  Palonosetron  HCl (ALOXI  IV) Inject into the vein.    [provider]  pantoprazole  (PROTONIX ) 40 MG tablet Take 1 tablet (40 mg total) by mouth daily. 05/10/24 05/10/25  Patel, Rutwik K, MD  polyethylene glycol (MIRALAX  / GLYCOLAX )  17 g packet Take 17 g by mouth daily as needed for mild constipation. 09/16/23   Ricky Fines, MD  rosuvastatin  (CRESTOR ) 40 MG tablet Take 1 tablet (40 mg total) by mouth daily. 09/03/23   Lavona Agent, MD  tamsulosin  (FLOMAX ) 0.4 MG CAPS capsule Take 2 capsules (0.8 mg total) by mouth daily. 10/27/23   Tobie Suzzane POUR, MD  warfarin (COUMADIN ) 5 MG tablet Take 0.5-1 tablets (2.5-5 mg total) by mouth See admin instructions. Take 2.5 mg by mouth Tuesday and Friday and take 5 mg on Monday, Wednesday, Thursday, Saturday and Sunday 02/11/24   Tobie Suzzane POUR, MD    Allergies: Patient has no known allergies.    Review of Systems  Constitutional:  Negative for chills, diaphoresis and fever.  HENT:  Negative for sore throat.   Eyes:  Negative for redness.  Respiratory:  Positive for cough and shortness of breath.   Cardiovascular:  Negative for chest pain, palpitations and leg swelling.  Gastrointestinal:  Negative for abdominal pain, diarrhea and vomiting.  Genitourinary:  Negative for dysuria and flank pain.  Musculoskeletal:  Negative for back pain and neck pain.  Neurological:  Negative for syncope and headaches.    Updated Vital Signs BP 91/79   Pulse 91   Temp 97.9 F (36.6 C)   Resp (!) 32   Ht 1.778 m (5' 10)   Wt 98 kg   SpO2 94%   BMI 30.99 kg/m   Physical Exam Vitals and nursing note reviewed.  Constitutional:      Appearance: Normal appearance. He is well-developed.  HENT:     Head: Atraumatic.     Nose: Nose normal.     Mouth/Throat:     Mouth: Mucous membranes are moist.     Pharynx: Oropharynx is clear.  Eyes:     General: No scleral icterus.    Conjunctiva/sclera: Conjunctivae normal.     Pupils: Pupils are equal, round, and reactive to light.  Neck:     Trachea: No tracheal deviation.     Comments: Trachea midline, no neck stiffness or rigidity. Thyroid  not grossly enlarged or tender.  Cardiovascular:     Rate and Rhythm: Tachycardia present. Rhythm  irregular.     Pulses: Normal pulses.     Heart sounds: Normal heart sounds. No murmur heard.    No friction rub. No gallop.  Pulmonary:     Effort: Pulmonary effort is normal. No accessory muscle usage or respiratory distress.     Breath sounds: Normal breath sounds.  Abdominal:     General: Bowel sounds are normal. There is no distension.     Palpations: Abdomen is soft. There is no mass.     Tenderness: There is no abdominal tenderness. There is no guarding.  Genitourinary:    Comments: No cva tenderness. Musculoskeletal:        General: No swelling or tenderness.  Cervical back: Normal range of motion and neck supple. No rigidity.  Skin:    General: Skin is warm and dry.     Findings: No rash.  Neurological:     Mental Status: He is alert.     Comments: Alert, speech clear. Motor/sens grossly intact bil.   Psychiatric:        Mood and Affect: Mood normal.     (all labs ordered are listed, but only abnormal results are displayed) Results for orders placed or performed during the hospital encounter of 06/21/24  Protime-INR   Collection Time: 06/21/24 11:39 AM  Result Value Ref Range   Prothrombin Time 26.8 (H) 11.4 - 15.2 seconds   INR 2.3 (H) 0.8 - 1.2  Basic metabolic panel   Collection Time: 06/21/24 11:41 AM  Result Value Ref Range   Sodium 134 (L) 135 - 145 mmol/L   Potassium 4.3 3.5 - 5.1 mmol/L   Chloride 96 (L) 98 - 111 mmol/L   CO2 23 22 - 32 mmol/L   Glucose, Bld 120 (H) 70 - 99 mg/dL   BUN 21 8 - 23 mg/dL   Creatinine, Ser 8.86 0.61 - 1.24 mg/dL   Calcium  9.0 8.9 - 10.3 mg/dL   GFR, Estimated >39 >39 mL/min   Anion gap 15 5 - 15  CBC   Collection Time: 06/21/24 11:41 AM  Result Value Ref Range   WBC 10.3 4.0 - 10.5 K/uL   RBC 4.66 4.22 - 5.81 MIL/uL   Hemoglobin 11.8 (L) 13.0 - 17.0 g/dL   HCT 61.7 (L) 60.9 - 47.9 %   MCV 82.0 80.0 - 100.0 fL   MCH 25.3 (L) 26.0 - 34.0 pg   MCHC 30.9 30.0 - 36.0 g/dL   RDW 84.3 (H) 88.4 - 84.4 %   Platelets  212 150 - 400 K/uL   nRBC 0.0 0.0 - 0.2 %   *Note: Due to a large number of results and/or encounters for the requested time period, some results have not been displayed. A complete set of results can be found in Results Review.     EKG: EKG Interpretation Date/Time:  Monday June 21 2024 11:31:10 EDT Ventricular Rate:  161 PR Interval:    QRS Duration:  117 QT Interval:  331 QTC Calculation: 542 R Axis:   92  Text Interpretation: Atrial fibrillation with rapid V-rate Nonspecific intraventricular conduction delay Non-specific ST-t changes Confirmed by Bernard Drivers ([id number]) on 06/21/2024 11:38:32 AM  Radiology: No results found.  {Document cardiac monitor, telemetry assessment procedure when appropriate:32947} Procedures   Medications Ordered in the ED  diltiazem  (CARDIZEM ) 1 mg/mL load via infusion 10 mg (10 mg Intravenous Bolus from Bag 06/21/24 1151)    And  diltiazem  (CARDIZEM ) 125 mg in dextrose  5% 125 mL (1 mg/mL) infusion (0 mg/hr Intravenous Stopped 06/21/24 1200)      {Click here for ABCD2, HEART and other calculators REFRESH Note before signing:1}                              Medical Decision Making Amount and/or Complexity of Data Reviewed Labs: ordered. Radiology: ordered.  Risk Prescription drug management.   Iv ns. Continuous pulse ox and cardiac monitoring. Labs ordered/sent. Imaging ordered.   Differential diagnosis includes afib/rvr, chf, pna, acs, etc. Dispo decision including potential need for admission considered - will get labs and imaging and reassess.   Reviewed nursing notes and prior charts for additional  history. External reports reviewed. Additional history from: family.  Cardiac monitor:afib rate 152.   Cardizem  bolus and gtt.   Labs reviewed/interpreted by me - chem largely unremarkable. Wbc 10, hgb 12.   Xrays reviewed/interpreted by me -   Hr improved on cardizem . Bp is soft. No chest pain or worsening sob. Dilt gtt stopped. Ivf  bolus.      {Document critical care time when appropriate  Document review of labs and clinical decision tools ie CHADS2VASC2, etc  Document your independent review of radiology images and any outside records  Document your discussion with family members, caretakers and with consultants  Document social determinants of health affecting pt's care  Document your decision making why or why not admission, treatments were needed:32947:::1}   Final diagnoses:  None    ED Discharge Orders     None

## 2024-06-21 NOTE — Assessment & Plan Note (Signed)
-   Blood pressure stable review home medication -Will resume accordingly, currently blood pressure running soft on Cardizem  drip

## 2024-06-21 NOTE — Assessment & Plan Note (Signed)
-   Acute on chronic respiratory failure, with history of COPD, currently COPD exacerbation -Currently satting 96% on 3 L of oxygen  -Continue supplemental oxygen  with goal O2 sat of 88-92% -Tapering off O2 supplements, IV steroids -Solu-Medrol  120 mg IV was given will continue with 80 twice daily -Continue with DuoNeb bronchodilators, mucolytics -On encouraging spirometer and flutter valve

## 2024-06-21 NOTE — TOC CM/SW Note (Signed)
 Transition of Care [facility]) - Inpatient Brief Assessment   Patient Details  Name: Randall Sullivan MRN: [id number] Date of Birth: 10/21/1950  Transition of Care [facility]) CM/SW Contact:    Lucie Lunger, LCSWA Phone Number: 06/21/2024, 1:23 PM   Clinical Narrative: Transition of Care Department [facility]) has reviewed patient and no TOC needs have been identified at this time. We will continue to monitor patient advancement through interdiciplinary progression rounds. If new patient transition needs arise, please place a TOC consult.  Transition of Care Asessment: Insurance and Status: Insurance coverage has been reviewed Patient has primary care physician: Yes Home environment has been reviewed: From home Prior level of function:: Independent Prior/Current Home Services: No current home services Social Drivers of Health Review: SDOH reviewed no interventions necessary Readmission risk has been reviewed: Yes Transition of care needs: no transition of care needs at this time

## 2024-06-21 NOTE — Assessment & Plan Note (Signed)
-   Prediabetic with hemoglobin of 5.4 -Since patient cannot be on steroids, anticipating hyperglycemia -Will check CBG Q ACHS, SSI coverage

## 2024-06-21 NOTE — ED Notes (Signed)
 EKG stated STEMI, EDP said he is not 100% sure if it is accurate, patient is tachy.

## 2024-06-21 NOTE — Assessment & Plan Note (Signed)
-   Monitoring closely -Keeping blood pressure check -Follow-up as an outpatient

## 2024-06-21 NOTE — H&P (Signed)
 History and Physical   Patient: Randall Sullivan                            PCP: Davonna Siad, MD                    DOB: 01/14/1951            DOA: 06/21/2024 FMW:[id number]             DOS: 06/21/2024, 1:40 PM  Davonna Siad, MD  Patient coming from:   HOME  I have personally reviewed patient's medical records, in electronic medical records, including:  [place] link, and care everywhere.    Chief Complaint:   Chief Complaint  Patient presents with   Shortness of Breath    History of present illness:    Randall Sullivan is a 73 year old with history of A-fib, on Coumadin , COPD,  AAA, lung cancer, HTN, HLD, PVD, prediabetic .[name] Presenting to ED with chief complaint of shortness of breath, cough, and palpitation. Patient reports his symptoms has been progressively worsening in the past few days.  Denies any chest pain, denies any fever or chills, nausea or vomiting.  No excessive swelling in extremities  ED evaluation: Blood pressure 119/71, pulse 87, temperature 97.9 F (36.6 C), resp. rate (!) 21, height 5' 10 (1.778 m), weight 98 kg, SpO2 96%.  LABs: WBC 10.3, hemoglobin 11.8, sodium 134, chloride 96, creatinine 1.13, INR 2.3, troponin 16, glucose 126, BNP 226 -Respiratory panel All negative - Chest x-ray;  Patchy right upper and right basilar airspace opacification, indicative of pneumonia. 2. Small loculated right pleural fluid. 3. Right upper lobe wedge resection.  Left upper lobectom  On arrival patient was found in A-fib with RVR, with a heart rate of 154 started on Cardizem  drip-currently 7  Requested patient to be admitted for rate control, acute on chronic respiratory failure, COPD exacerbation, treat for possible pneumonia    Patient Denies having: Fever, Chills, Cough, SOB, Chest Pain, Abd pain, N/V/D, headache, dizziness, lightheadedness,  Dysuria, Joint pain, rash, open wounds  ED Course:   Blood pressure 129/76, pulse 89, temperature 97.9 F (36.6 C),  resp. rate (!) 32, height 5' 10 (1.778 m), weight 98 kg, SpO2 95%. Abnormal labs;   Review of Systems: As per HPI, otherwise 10 point review of systems were negative.   ----------------------------------------------------------------------------------------------------------------------  No Known Allergies  Home MEDs:  Prior to Admission medications   Medication Sig Start Date End Date Taking? Authorizing Provider  acetaminophen  (TYLENOL ) 325 MG tablet Take 2 tablets (650 mg total) by mouth every 6 (six) hours as needed for mild pain (or Fever >/= 101). 03/22/21   Angiulli, Toribio PARAS, PA-C  ALPRAZolam  (XANAX ) 0.5 MG tablet Take 0.5 mg by mouth daily. 12/04/23   [provider]  aluminum -magnesium  hydroxide 200-200 MG/5ML suspension Take 15 mLs by mouth every 6 (six) hours as needed for indigestion (Mix 1:1 with lidocaine  and swish and spit). 01/27/24   Rogers Hai, MD  betamethasone  dipropionate 0.05 % cream Apply topically 2 (two) times daily. 03/30/24   Katragadda, Sreedhar, MD  budesonide -glycopyrrolate -formoterol  (BREZTRI  AEROSPHERE) 160-9-4.8 MCG/ACT AERO inhaler Inhale 2 puffs into the lungs in the morning and at bedtime. 04/08/24   Tobie Suzzane POUR, MD  cetirizine  (ZYRTEC ) 10 MG tablet Take 10 mg by mouth daily.    [provider]  fenofibrate  (TRICOR ) 145 MG tablet Take 1 tablet (145 mg total) by  mouth daily. 12/29/23   Tobie Suzzane POUR, MD  finasteride  (PROSCAR ) 5 MG tablet Take 1 tablet (5 mg total) by mouth daily. 05/27/24   Tobie Suzzane POUR, MD  hydrOXYzine  (ATARAX ) 25 MG tablet Take 1 tablet (25 mg total) by mouth every 8 (eight) hours as needed for itching. 05/11/24   Tobie Suzzane POUR, MD  lidocaine  (XYLOCAINE ) 2 % solution Use as directed 15 mLs in the mouth or throat every 6 (six) hours as needed for mouth pain (Mix 1:1 with Maalox and swish and spit). 01/27/24   Rogers Hai, MD  lidocaine -prilocaine  (EMLA ) cream Apply 1 Application topically as needed.  04/27/24   Rogers Hai, MD  magnesium  oxide (MAG-OX) 400 (240 Mg) MG tablet Take 1 tablet (400 mg total) by mouth 2 (two) times daily. 01/27/24   Rogers Hai, MD  meclizine  (ANTIVERT ) 25 MG tablet Take 1 tablet (25 mg total) by mouth 3 (three) times daily as needed for dizziness. 09/16/23   Ricky Fines, MD  metoprolol  succinate (TOPROL  XL) 50 MG 24 hr tablet Take 1 tablet (50 mg total) by mouth daily. Take with or immediately following a meal. 03/26/24 03/26/25  Tobie Suzzane POUR, MD  Omega-3 Fatty Acids (FISH OIL ULTRA PO) Take 1,200 mg by mouth in the morning and at bedtime.    [provider]  OXYGEN  Inhale 2 L into the lungs continuous. Hx of lung ca, copd, and emphysema    [provider]  Palonosetron  HCl (ALOXI  IV) Inject into the vein.    [provider]  polyethylene glycol (MIRALAX  / GLYCOLAX ) 17 g packet Take 17 g by mouth daily as needed for mild constipation. 09/16/23   Ricky Fines, MD  rosuvastatin  (CRESTOR ) 40 MG tablet Take 1 tablet (40 mg total) by mouth daily. 09/03/23   Lavona Agent, MD  tamsulosin  (FLOMAX ) 0.4 MG CAPS capsule Take 2 capsules (0.8 mg total) by mouth daily. 10/27/23   Tobie Suzzane POUR, MD  warfarin (COUMADIN ) 5 MG tablet Take 0.5-1 tablets (2.5-5 mg total) by mouth See admin instructions. Take 2.5 mg by mouth Tuesday and Friday and take 5 mg on Monday, Wednesday, Thursday, Saturday and Sunday 02/11/24   Tobie Suzzane POUR, MD    PRN MEDs: acetaminophen  **OR** acetaminophen , ALPRAZolam , bisacodyl , hydrALAZINE , HYDROmorphone  (DILAUDID ) injection, hydrOXYzine , ondansetron  **OR** ondansetron  (ZOFRAN ) IV, oxyCODONE , senna-docusate, sodium phosphate , traZODone   Past Medical History:  Diagnosis Date   A-fib (HCC) 2017   after last lung surgery patient had a history if Afib documented in hospital    AAA (abdominal aortic aneurysm) (HCC)    4.0 cm 09/17/22 US    Acute appendicitis 07/14/2021   Arthritis    back & legs ( knees)    Cancer (HCC)    lung   Complication of anesthesia    agitated upon waking from anesth.    Dyspnea    Dysrhythmia    A. Fib   Emphysema    Emphysema of lung (HCC)    History of kidney stones    Hyperlipidemia    Hypertension    Oxygen  dependent    2L continuous   Peripheral vascular disease (HCC)    Aortic Aneurysm   Pneumonia    Pre-diabetes    Sleep apnea    No OSA, but wears 2L [place] O2 continuous including at night   Stroke [facility]) 2022   Right sided weakness, decreased hearing, balance and memory loss   Tobacco abuse     Past Surgical History:  Procedure Laterality Date  HERNIA REPAIR Bilateral 1999   umbilical   IR IMAGING GUIDED PORT INSERTION  12/09/2023   LAPAROSCOPIC APPENDECTOMY N/A 07/15/2021   Procedure: APPENDECTOMY LAPAROSCOPIC;  Surgeon: Kallie Manuelita BROCKS, MD;  Location: [facility];  Service: General;  Laterality: N/A;   VIDEO ASSISTED THORACOSCOPY Right 12/21/2018   Procedure: VIDEO ASSISTED THORACOSCOPY AND RESECTION OF BLEBS RIGHT LOWER LOBE;  Surgeon: Kerrin Elspeth BROCKS, MD;  Location: [facility] OR;  Service: Thoracic;  Laterality: Right;   VIDEO ASSISTED THORACOSCOPY (VATS)/ LOBECTOMY Left 08/01/2016   Procedure: VIDEO ASSISTED THORACOSCOPY (VATS)/ LEFT UPPER LOBECTOMY with lymph node sampling and onQ placement;  Surgeon: Elspeth BROCKS Kerrin, MD;  Location: [facility] OR;  Service: Thoracic;  Laterality: Left;   VIDEO ASSISTED THORACOSCOPY (VATS)/WEDGE RESECTION Right 12/09/2018   Procedure: VIDEO ASSISTED THORACOSCOPY (VATS)/WEDGE RESECTION;  Surgeon: Kerrin Elspeth BROCKS, MD;  Location: [facility];  Service: Thoracic;  Laterality: Right;   VIDEO BRONCHOSCOPY Bilateral 12/21/2018   Procedure: VIDEO BRONCHOSCOPY;  Surgeon: Kerrin Elspeth BROCKS, MD;  Location: [facility];  Service: Thoracic;  Laterality: Bilateral;   VIDEO BRONCHOSCOPY WITH ENDOBRONCHIAL NAVIGATION N/A 07/03/2016   Procedure: VIDEO BRONCHOSCOPY WITH ENDOBRONCHIAL NAVIGATION;  Surgeon: Elspeth BROCKS Kerrin, MD;   Location: [facility];  Service: Thoracic;  Laterality: N/A;   VIDEO BRONCHOSCOPY WITH ENDOBRONCHIAL NAVIGATION N/A 10/24/2023   Procedure: VIDEO BRONCHOSCOPY WITH ENDOBRONCHIAL NAVIGATION;  Surgeon: Kerrin Elspeth BROCKS, MD;  Location: [facility];  Service: Thoracic;  Laterality: N/A;   VIDEO BRONCHOSCOPY WITH ENDOBRONCHIAL ULTRASOUND N/A 10/24/2023   Procedure: VIDEO BRONCHOSCOPY WITH ENDOBRONCHIAL ULTRASOUND;  Surgeon: Kerrin Elspeth BROCKS, MD;  Location: [facility];  Service: Thoracic;  Laterality: N/A;   VIDEO BRONCHOSCOPY WITH INSERTION OF INTERBRONCHIAL VALVE (IBV) N/A 12/11/2018   Procedure: VIDEO BRONCHOSCOPY WITH INSERTION OF INTERBRONCHIAL VALVE (IBV);  Surgeon: Kerrin Elspeth BROCKS, MD;  Location: [facility] OR;  Service: Thoracic;  Laterality: N/A;   VIDEO BRONCHOSCOPY WITH INSERTION OF INTERBRONCHIAL VALVE (IBV) N/A 02/10/2019   Procedure: VIDEO BRONCHOSCOPY WITH REMOVAL OF INTERBRONCHIAL VALVE (IBV);  Surgeon: Kerrin Elspeth BROCKS, MD;  Location: [facility] OR;  Service: Thoracic;  Laterality: N/A;     reports that he quit smoking about 8 years ago. His smoking use included cigarettes. He started smoking about 48 years ago. He has a 40 pack-year smoking history. He has never used smokeless tobacco. He reports that he does not currently use alcohol. He reports that he does not use drugs.   Family History  Problem Relation Age of Onset   Cancer Mother    Cancer Sister    Aneurysm Sister    Kidney disease Paternal Aunt    Asthma Brother    Cancer - Lung Brother    Cancer Brother    Arthritis Brother     Physical Exam:   Vitals:   06/21/24 1230 06/21/24 1300 06/21/24 1315 06/21/24 1330  BP: 130/61 119/71 123/65 129/76  Pulse: 91 87 89 89  Resp: (!) 24 (!) 21 (!) 22 (!) 32  Temp:      SpO2: 95% 96% 95% 95%  Weight:      Height:       Constitutional: NAD, calm, comfortable Eyes: PERRL, lids and conjunctivae normal ENMT: Mucous membranes are moist. Posterior pharynx clear of any exudate or  lesions.Normal dentition.  Neck: normal, supple, no masses, no thyromegaly Respiratory: clear to auscultation bilaterally, no wheezing, no crackles. Normal respiratory effort. No accessory muscle use.  Cardiovascular: Regular rate and rhythm, no murmurs / rubs / gallops. No extremity edema. 2+ pedal  pulses. No carotid bruits.  Abdomen: no tenderness, no masses palpated. No hepatosplenomegaly. Bowel sounds positive.  Musculoskeletal: no clubbing / cyanosis. No joint deformity upper and lower extremities. Good ROM, no contractures. Normal muscle tone.  Neurologic: CN II-XII grossly intact. Sensation intact, DTR normal. Strength 5/5 in all 4.  Psychiatric: Normal judgment and insight. Alert and oriented x 3. Normal mood.  Skin: no rashes, lesions, ulcers. No induration Decubitus/ulcers:  Wounds: per nursing documentation         Labs on admission:    I have personally reviewed following labs and imaging studies  CBC: Recent Labs  Lab 06/21/24 1141  WBC 10.3  HGB 11.8*  HCT 38.2*  MCV 82.0  PLT 212   Basic Metabolic Panel: Recent Labs  Lab 06/21/24 1141  NA 134*  K 4.3  CL 96*  CO2 23  GLUCOSE 120*  BUN 21  CREATININE 1.13  CALCIUM  9.0    Coagulation Profile: Recent Labs  Lab 06/15/24 0838 06/21/24 1139  INR 2.9 2.3*    Urine analysis:    Component Value Date/Time   COLORURINE YELLOW 03/12/2021 0620   APPEARANCEUR CLEAR 03/12/2021 0620   LABSPEC 1.019 03/12/2021 0620   PHURINE 5.0 03/12/2021 0620   GLUCOSEU 50 (A) 03/12/2021 0620   HGBUR NEGATIVE 03/12/2021 0620   BILIRUBINUR NEGATIVE 03/12/2021 0620   KETONESUR NEGATIVE 03/12/2021 0620   PROTEINUR NEGATIVE 03/12/2021 0620   UROBILINOGEN 0.2 10/04/2011 1026   NITRITE NEGATIVE 03/12/2021 0620   LEUKOCYTESUR NEGATIVE 03/12/2021 0620    Last A1C:  Lab Results  Component Value Date   HGBA1C 5.4 05/25/2024     Radiologic Exams on Admission:   DG Chest Port 1 View Result Date: 06/21/2024 CLINICAL  DATA:  Shortness of breath, tachycardia, lung cancer. EXAM: PORTABLE CHEST 1 VIEW COMPARISON:  CT chest 05/18/2024 and chest radiograph 10/21/2023. FINDINGS: Left IJ Port-A-Cath terminates in the low SVC. Trachea is midline. Heart size is accentuated by AP semi upright technique. Right upper lobe wedge resection. Patchy opacification in the right upper lobe and right lung base, new from 05/18/2024. Right perihilar consolidation, better seen on 05/18/2024. Rind of loculated pleural fluid in the right hemithorax left lung is grossly clear. Left upper lobectomy per report. No left pleural fluid. IMPRESSION: 1. Patchy right upper and right basilar airspace opacification, indicative of pneumonia. 2. Small loculated right pleural fluid. 3. Right upper lobe wedge resection.  Left upper lobectomy. 4. Nodular right perihilar consolidation, better evaluated on 05/18/2024 CT chest. Electronically Signed   By: Newell Eke M.D.   On: 06/21/2024 12:49    EKG:   Independently reviewed.  Orders placed or performed during the hospital encounter of 06/21/24   EKG 12-Lead   EKG 12-Lead   ED EKG   ED EKG   EKG 12-Lead   EKG 12-Lead   EKG 12-Lead   *Note: Due to a large number of results and/or encounters for the requested time period, some results have not been displayed. A complete set of results can be found in Results Review.   ---------------------------------------------------------------------------------------------------------------------------------------    Assessment / Plan:   Principal Problem:   Atrial fibrillation with RVR (HCC) Active Problems:   Recurrent non-small cell lung cancer (NSCLC) (HCC)   Essential (primary) hypertension   Hyperlipidemia   Benign prostatic hyperplasia with nocturia   S/P lobectomy of lung   Type 2 diabetes mellitus with other specified complication (HCC)   Infrarenal abdominal aortic aneurysm (AAA) without rupture (HCC)   Insomnia  History of CVA with  residual deficit   COPD exacerbation (HCC)   Allergic dermatitis   Peripheral neuropathy   CAP (community acquired pneumonia)   Assessment and Plan: * Atrial fibrillation with RVR (HCC) POA; in A-fib with RVR heart rate as high as 154 >> 87 With history of A-fib -Likely exacerbated by shortness of breath, COPD exacerbation - Started on Cardizem  drip -will titrate and taper off -Resume home medication: Including metoprolol  - Continue Coumadin  INR therapeutic 2.3  Recurrent non-small cell lung cancer (NSCLC) (HCC) - Patient is to follow-up with a primary oncologist -Currently not on chemo or radiation therapy  Benign prostatic hyperplasia with nocturia - Review home medication, continue Flomax , finasteride   Hyperlipidemia - Statins, fibrate, fish oil  Essential (primary) hypertension - Blood pressure stable review home medication -Will resume accordingly, currently blood pressure running soft on Cardizem  drip  CAP (community acquired pneumonia) - Possibly community-acquired pneumonia, chronic on chest x-ray - Chest x-ray patchy right upper and right basilar airspace opacification,indicative of pneumonia.2. Small loculated right pleural fluid.3. Right upper lobe wedge resection.  Left upper lobectom -No leukocytosis, afebrile -Obtaining procalcitonin level and lactic acid -Patient received IV azithromycin /Rocephin >> will switch to p.o. Levaquin  -Follow-up with cultures, will try to obtain sputum culture -Continue nebs, steroids  Peripheral neuropathy - Currently not on any medication, monitoring closely  COPD exacerbation (HCC) - Acute on chronic respiratory failure, with history of COPD, currently COPD exacerbation -Currently satting 96% on 3 L of oxygen  -Continue supplemental oxygen  with goal O2 sat of 88-92% -Tapering off O2 supplements, IV steroids -Solu-Medrol  120 mg IV was given will continue with 80 twice daily -Continue with DuoNeb bronchodilators, mucolytics -On  encouraging spirometer and flutter valve  History of CVA with residual deficit - Continue Coumadin , statins,  Insomnia - As needed trazodone   Infrarenal abdominal aortic aneurysm (AAA) without rupture (HCC) - Monitoring closely -Keeping blood pressure check -Follow-up as an outpatient  Type 2 diabetes mellitus with other specified complication (HCC) - Prediabetic with hemoglobin of 5.4 -Since patient cannot be on steroids, anticipating hyperglycemia -Will check CBG Q ACHS, SSI coverage   S/P lobectomy of lung - Monitor closely, in respite distress due to pneumonia, COPD exacerbation              Consults called:  None -------------------------------------------------------------------------------------------------------------------------------------------- DVT prophylaxis:  SCDs Start: 06/21/24 1308 warfarin (COUMADIN ) tablet 2.5-5 mg   Code Status:   Code Status: Full Code   Admission status: Patient will be admitted as Inpatient, with a greater than 2 midnight length of stay. Level of care: Stepdown   Family Communication:  none at bedside  (The above findings and plan of care has been discussed with patient in detail, the patient expressed understanding and agreement of above plan)  --------------------------------------------------------------------------------------------------------------------------------------------------  Disposition Plan:  Anticipated 1-2 days Status is: Inpatient Remains inpatient appropriate because: Needing IV antibiotic, respiratory support, nebs-Cardizem  drip, treating A-fib with RVR, pneumonia     ----------------------------------------------------------------------------------------------------------------------------------------------------  Time spent:  67  Min.  Was spent seeing and evaluating the patient, reviewing all medical records, drawn plan of care.  SIGNED: Adriana DELENA Grams, MD, FHM. FAAFP. [place] [facility], Pager  (Please use [email or web address] to page/ or secure chat through epic) If 7PM-7AM, please contact night-coverage [email or web address],  06/21/2024, 1:40 PM

## 2024-06-21 NOTE — Assessment & Plan Note (Signed)
-   Review home medication, continue Flomax , finasteride

## 2024-06-21 NOTE — Assessment & Plan Note (Signed)
-   Continue Coumadin , statins,

## 2024-06-21 NOTE — Assessment & Plan Note (Addendum)
-   Statins, fibrate, fish oil

## 2024-06-21 NOTE — Assessment & Plan Note (Addendum)
-   Patient is to follow-up with a primary oncologist -Currently not on chemo or radiation therapy

## 2024-06-21 NOTE — ED Notes (Signed)
 Spouse notified of pt being moved up to floor room.

## 2024-06-21 NOTE — Assessment & Plan Note (Signed)
As needed trazodone

## 2024-06-21 NOTE — Plan of Care (Signed)
  Problem: Acute Rehab PT Goals(only PT should resolve) Goal: Pt Will Ambulate Outcome: Progressing Flowsheets (Taken 06/21/2024 1558) Pt will Ambulate:  15 feet  with supervision  with least restrictive assistive device Goal: Pt/caregiver will Perform Home Exercise Program Outcome: Progressing Flowsheets (Taken 06/21/2024 1558) Pt/caregiver will Perform Home Exercise Program:  For increased strengthening  For improved balance  Independently   Randall Sullivan, PT, DPT

## 2024-06-21 NOTE — ED Notes (Signed)
 Diltiazem  drip stopped, SBP <90, Provider notified.

## 2024-06-21 NOTE — ED Notes (Signed)
 Pt placed back in bed, from sitting in chair.

## 2024-06-21 NOTE — ED Triage Notes (Signed)
 Pt in by POV from home. C/o of SOB x2 times. Pt tachcardic w/ rate of 150s in triage. Pt is a Ca pt w/ stage 3 lung cancer.

## 2024-06-21 NOTE — Evaluation (Signed)
 Physical Therapy Evaluation Patient Details Name: Randall Sullivan MRN: [id number] DOB: 1950-10-01 Today's Date: 06/21/2024  History of Present Illness  Randall Sullivan is a 73 year old with history of A-fib, on Coumadin , COPD,   AAA, lung cancer, HTN, HLD, PVD, prediabetic .[name]  Presenting to ED with chief complaint of shortness of breath, cough, and palpitation. Patient reports his symptoms has been progressively worsening in the past few days.  Denies any chest pain, denies any fever or chills, nausea or vomiting.  No excessive swelling in extremities (per MD)   Clinical Impression  Pt was agreeable to PT and OT co-evaluation. Pt appears to be near his baseline for his functional mobility. However, he required CGA when transferring from EOB to the chair due to unsteadiness. Pt reports that this is his baseline level of instability since a previous stroke. He was left in the chair with his call bell within reach and family present. Patient will benefit from continued skilled physical therapy in hospital and recommended venue below to increase strength, balance, endurance for safe ADLs and gait.       If plan is discharge home, recommend the following: A little help with walking and/or transfers;Assist for transportation;Help with stairs or ramp for entrance;A little help with bathing/dressing/bathroom   Can travel by private vehicle        Equipment Recommendations None recommended by PT  Recommendations for Other Services       Functional Status Assessment Patient has not had a recent decline in their functional status     Precautions / Restrictions Precautions Precautions: Fall Recall of Precautions/Restrictions: Intact Restrictions Weight Bearing Restrictions Per Provider Order: No      Mobility  Bed Mobility Overal bed mobility: Independent                  Transfers Overall transfer level: Needs assistance   Transfers: Sit to/from Stand, Bed to  chair/wheelchair/BSC Sit to Stand: Contact guard assist   Step pivot transfers: Contact guard assist       General transfer comment: Unsteady but family and pt report that is his baseline.    Ambulation/Gait Ambulation/Gait assistance: Contact guard assist Gait Distance (Feet): 4 Feet   Gait Pattern/deviations: Step-through pattern, Decreased stride length, Staggering right Gait velocity: decreased        Stairs            Wheelchair Mobility     Tilt Bed    Modified Rankin (Stroke Patients Only)       Balance Overall balance assessment: Needs assistance Sitting-balance support: No upper extremity supported, Feet supported Sitting balance-Leahy Scale: Good Sitting balance - Comments: seated at EOB   Standing balance support: During functional activity, No upper extremity supported Standing balance-Leahy Scale: Fair Standing balance comment: poor to fair without AD                             Pertinent Vitals/Pain Pain Assessment Pain Assessment: No/denies pain    Home Living Family/patient expects to be discharged to:: Private residence Living Arrangements: Spouse/significant other Available Help at Discharge: Family;Available PRN/intermittently Type of Home: House Home Access: Stairs to enter Entrance Stairs-Rails: Left;Right;Can reach both Entrance Stairs-Number of Steps: 4   Home Layout: One level Home Equipment: Cane - single point;Shower seat;Rolling Walker (2 wheels);Grab bars - tub/shower      Prior Function Prior Level of Function : Needs assist       Physical Assist :  ADLs (physical)   ADLs (physical): IADLs Mobility Comments: Ambulates without AD inside and uses SPC when outside. Uses supplemental O2 at baseline. ADLs Comments: Independent ADL; assist IADL's.     Extremity/Trunk Assessment   Upper Extremity Assessment Upper Extremity Assessment: Defer to OT evaluation    Lower Extremity Assessment Lower Extremity  Assessment: Overall WFL for tasks assessed    Cervical / Trunk Assessment Cervical / Trunk Assessment: Normal  Communication   Communication Communication: No apparent difficulties    Cognition Arousal: Alert Behavior During Therapy: WFL for tasks assessed/performed   PT - Cognitive impairments: No apparent impairments                         Following commands: Intact       Cueing Cueing Techniques: Verbal cues, Tactile cues     General Comments      Exercises     Assessment/Plan    PT Assessment Patient needs continued PT services  PT Problem List Decreased strength;Decreased balance;Decreased activity tolerance;Decreased mobility       PT Treatment Interventions DME instruction;Gait training;Stair training;Functional mobility training;Therapeutic activities;Therapeutic exercise;Balance training;Patient/family education    PT Goals (Current goals can be found in the Care Plan section)  Acute Rehab PT Goals Patient Stated Goal: Pt wants to return home. PT Goal Formulation: With patient/family Time For Goal Achievement: 06/28/24 Potential to Achieve Goals: Good    Frequency Min 3X/week     Co-evaluation PT/OT/SLP Co-Evaluation/Treatment: Yes Reason for Co-Treatment: To address functional/ADL transfers PT goals addressed during session: Mobility/safety with mobility;Balance OT goals addressed during session: ADL's and self-care       AM-PAC PT 6 Clicks Mobility  Outcome Measure Help needed turning from your back to your side while in a flat bed without using bedrails?: None Help needed moving from lying on your back to sitting on the side of a flat bed without using bedrails?: None Help needed moving to and from a bed to a chair (including a wheelchair)?: None Help needed standing up from a chair using your arms (e.g., wheelchair or bedside chair)?: None Help needed to walk in hospital room?: A Little Help needed climbing 3-5 steps with a  railing? : A Little 6 Click Score: 22    End of Session Equipment Utilized During Treatment: Oxygen  Activity Tolerance: Patient tolerated treatment well Patient left: in chair;with family/visitor present;with call bell/phone within reach Nurse Communication: Mobility status;Precautions PT Visit Diagnosis: Unsteadiness on feet (R26.81);Muscle weakness (generalized) (M62.81);Difficulty in walking, not elsewhere classified (R26.2)    Time: 1450-1501 PT Time Calculation (min) (ACUTE ONLY): 11 min   Charges:   PT Evaluation $PT Eval Low Complexity: 1 Low   PT General Charges $$ ACUTE PT VISIT: 1 Visit         Lacinda Fass, PT, DPT  06/21/2024, 3:53 PM

## 2024-06-21 NOTE — ED Notes (Signed)
 Provider ordered to keep Diltiazem  drip off for now.

## 2024-06-21 NOTE — Hospital Course (Signed)
 Randall Sullivan is a 74 year old with history of A-fib, on Coumadin , COPD, AAA, lung cancer, HTN, HLD, PVD, prediabetic. He presenting to ED with chief complaint of shortness of breath, cough, and palpitation. Patient reports his symptoms has been progressively worsening in the past few days.  Denies any chest pain, denies any fever or chills, nausea or vomiting.  No excessive swelling in extremities.    On arrival patient was found in A-fib with RVR, with a heart rate of 154 started on Cardizem  drip.    Requested patient to be admitted for rate control, acute on chronic respiratory failure, COPD exacerbation, treat for possible pneumonia

## 2024-06-21 NOTE — Assessment & Plan Note (Signed)
-   Currently not on any medication, monitoring closely

## 2024-06-21 NOTE — Assessment & Plan Note (Signed)
-   Possibly community-acquired pneumonia, chronic on chest x-ray - Chest x-ray patchy right upper and right basilar airspace opacification,indicative of pneumonia.2. Small loculated right pleural fluid.3. Right upper lobe wedge resection.  Left upper lobectom -No leukocytosis, afebrile -Obtaining procalcitonin level and lactic acid -Patient received IV azithromycin /Rocephin >> will switch to p.o. Levaquin  -Follow-up with cultures, will try to obtain sputum culture -Continue nebs, steroids

## 2024-06-21 NOTE — Progress Notes (Signed)
 PHARMACY - ANTICOAGULATION CONSULT NOTE  Pharmacy Consult for warfarin Indication: atrial fibrillation  No Known Allergies  Patient Measurements: Height: 5' 10 (177.8 cm) Weight: 98 kg (216 lb) IBW/kg (Calculated) : 73 HEPARIN  DW (KG): 93.3  Vital Signs: Temp: 97.9 F (36.6 C) (09/22 1110) BP: 136/61 (09/22 1415) Pulse Rate: 85 (09/22 1415)  Labs: Recent Labs    06/21/24 1139 06/21/24 1141  HGB  --  11.8*  HCT  --  38.2*  PLT  --  212  LABPROT 26.8*  --   INR 2.3*  --   CREATININE  --  1.13  TROPONINIHS 16  --     Estimated Creatinine Clearance: 67.3 mL/min (by C-G formula based on SCr of 1.13 mg/dL).   Medical History: Past Medical History:  Diagnosis Date   A-fib (HCC) 2017   after last lung surgery patient had a history if Afib documented in hospital    AAA (abdominal aortic aneurysm)    4.0 cm 09/17/22 US    Acute appendicitis 07/14/2021   Arthritis    back & legs ( knees)   Cancer (HCC)    lung   Complication of anesthesia    agitated upon waking from anesth.    Dyspnea    Dysrhythmia    A. Fib   Emphysema    Emphysema of lung (HCC)    History of kidney stones    Hyperlipidemia    Hypertension    Oxygen  dependent    2L continuous   Peripheral vascular disease    Aortic Aneurysm   Pneumonia    Pre-diabetes    Sleep apnea    No OSA, but wears 2L [place] O2 continuous including at night   Stroke [facility]) 2022   Right sided weakness, decreased hearing, balance and memory loss   Tobacco abuse     Medications:  See med rec  Assessment: 73 yo male with history of a-fib on coumadin , and DOPD, AAA, Lung CA, HTN, HLD, PVD. Paitent with complaints of shortness of breath, cough, and palpitation. Patient reports his symptoms has been progressively worsening in the past few days. Pharmacy asked to dose warfarin   INR= 2.3, therapeutic  Home dose per anticoag clinic: 5mg  on MWF, and 2.5mg  all other days.  Goal of Therapy:  INR 2-3 Monitor platelets by  anticoagulation protocol: Yes   Plan:  Warfarin 5mg  today x 1 Daily PT-INR Monitor for S/s of bleeding  Cherlyn Boers, BS Pharm D, BCPS Clinical Pharmacist 06/21/2024,2:22 PM

## 2024-06-22 ENCOUNTER — Inpatient Hospital Stay

## 2024-06-22 ENCOUNTER — Inpatient Hospital Stay: Admitting: Oncology

## 2024-06-22 DIAGNOSIS — I4891 Unspecified atrial fibrillation: Secondary | ICD-10-CM | POA: Diagnosis not present

## 2024-06-22 LAB — GLUCOSE, CAPILLARY
Glucose-Capillary: 177 mg/dL — ABNORMAL HIGH (ref 70–99)
Glucose-Capillary: 150 mg/dL — ABNORMAL HIGH (ref 70–99)
Glucose-Capillary: 240 mg/dL — ABNORMAL HIGH (ref 70–99)
Glucose-Capillary: 149 mg/dL — ABNORMAL HIGH (ref 70–99)

## 2024-06-22 LAB — BASIC METABOLIC PANEL WITH GFR
Anion gap: 13 (ref 5–15)
BUN: 26 mg/dL — ABNORMAL HIGH (ref 8–23)
CO2: 26 mmol/L (ref 22–32)
Calcium: 8.8 mg/dL — ABNORMAL LOW (ref 8.9–10.3)
Chloride: 99 mmol/L (ref 98–111)
Creatinine, Ser: 1.04 mg/dL (ref 0.61–1.24)
GFR, Estimated: >60 mL/min (ref >60)
Glucose, Bld: 168 mg/dL — ABNORMAL HIGH (ref 70–99)
Potassium: 4.5 mmol/L (ref 3.5–5.1)
Sodium: 138 mmol/L (ref 135–145)

## 2024-06-22 LAB — CBC
HCT: 33.1 % — ABNORMAL LOW (ref 39.0–52.0)
Hemoglobin: 10.3 g/dL — ABNORMAL LOW (ref 13.0–17.0)
MCH: 25.6 pg — ABNORMAL LOW (ref 26.0–34.0)
MCHC: 31.1 g/dL (ref 30.0–36.0)
MCV: 82.1 fL (ref 80.0–100.0)
Platelets: 187 K/uL (ref 150–400)
RBC: 4.03 MIL/uL — ABNORMAL LOW (ref 4.22–5.81)
RDW: 15.4 % (ref 11.5–15.5)
WBC: 8.6 K/uL (ref 4.0–10.5)
nRBC: 0 % (ref 0.0–0.2)

## 2024-06-22 LAB — PROTIME-INR
INR: 2.9 — ABNORMAL HIGH (ref 0.8–1.2)
Prothrombin Time: 32 s — ABNORMAL HIGH (ref 11.4–15.2)

## 2024-06-22 LAB — BRAIN NATRIURETIC PEPTIDE: B Natriuretic Peptide: 211 pg/mL — ABNORMAL HIGH (ref 0.0–100.0)

## 2024-06-22 LAB — LIPID PANEL
Cholesterol: 95 mg/dL (ref 0–200)
HDL: 37 mg/dL — ABNORMAL LOW (ref >40)
LDL Cholesterol: 43 mg/dL (ref 0–99)
Total CHOL/HDL Ratio: 2.6 ratio
Triglycerides: 74 mg/dL (ref <150)
VLDL: 15 mg/dL (ref 0–40)

## 2024-06-22 MED ORDER — FUROSEMIDE 10 MG/ML IJ SOLN
40.0000 mg | Freq: Once | INTRAMUSCULAR | Status: AC
  Administered 2024-06-22: 40 mg via INTRAVENOUS
  Filled 2024-06-22: qty 4

## 2024-06-22 MED ORDER — SODIUM CHLORIDE 0.9% FLUSH: 10.0000 mL | INTRAVENOUS | Status: DC | PRN

## 2024-06-22 MED ORDER — CHLORHEXIDINE GLUCONATE CLOTH 2 % EX PADS
6.0000 | MEDICATED_PAD | Freq: Every day | CUTANEOUS | Status: DC
  Administered 2024-06-22 – 2024-06-26 (×3): 6 via TOPICAL

## 2024-06-22 MED ORDER — WARFARIN SODIUM 2.5 MG PO TABS
2.5000 mg | ORAL_TABLET | Freq: Once | ORAL | Status: AC
  Administered 2024-06-22: 2.5 mg via ORAL
  Filled 2024-06-22: qty 1

## 2024-06-22 MED ORDER — SODIUM CHLORIDE 0.9% FLUSH
10.0000 mL | Freq: Two times a day (BID) | INTRAVENOUS | Status: DC
  Administered 2024-06-22 – 2024-06-26 (×7): 10 mL

## 2024-06-22 MED ORDER — METOPROLOL SUCCINATE ER 50 MG PO TB24
100.0000 mg | ORAL_TABLET | Freq: Every day | ORAL | Status: DC
Start: 2024-06-23 — End: 2024-06-26
  Administered 2024-06-23 – 2024-06-26 (×4): 100 mg via ORAL
  Filled 2024-06-22 (×4): qty 2

## 2024-06-22 MED ORDER — METHYLPREDNISOLONE SODIUM SUCC 125 MG IJ SOLR
40.0000 mg | Freq: Two times a day (BID) | INTRAMUSCULAR | Status: DC
  Administered 2024-06-22 – 2024-06-26 (×8): 40 mg via INTRAVENOUS
  Filled 2024-06-22 (×8): qty 2

## 2024-06-22 NOTE — TOC Initial Note (Signed)
 Transition of Care [facility]) - Initial/Assessment Note    Patient Details  Name: Randall Sullivan MRN: [id number] Date of Birth: [birth year]  [facility] CM/SW Contact:    Lucie Lunger, LCSWA Phone Number: 06/22/2024, 10:43 AM  Clinical Narrative:                 Pt is high risk for readmission and PT is recommending HH PT for pt. CSW spoke with pts wife to complete assessment. Pt is able to complete some ADLs but wife is able to assist when needed. Pts wife provides transport when needed. Pt has a cane and walker to use if and when needed. Pt is on O2 at baseline. CSW spoke with pts wife about [facility] PT being recommended, she states they had HH with [facility] in the past and are agreeable to this again. She asked if they could do [facility] PT/RN, CSW to make referral prior to hospital D/C. TOC to follow.   Expected Discharge Plan: Home w Home Health Services Barriers to Discharge: Continued Medical Work up   Patient Goals and CMS Choice Patient states their goals for this hospitalization and ongoing recovery are:: return home with [facility] CMS Medicare.gov Compare Post Acute Care list provided to:: Patient Choice offered to / list presented to : Patient      Expected Discharge Plan and Services In-house Referral: Clinical Social Work Discharge Planning Services: CM Consult Post Acute Care Choice: Home Health Living arrangements for the past 2 months: Single Family Home                                      Prior Living Arrangements/Services Living arrangements for the past 2 months: Single Family Home Lives with:: Spouse Patient language and need for interpreter reviewed:: Yes Do you feel safe going back to the place where you live?: Yes      Need for Family Participation in Patient Care: Yes (Comment) Care giver support system in place?: Yes (comment) Current home services: DME Criminal Activity/Legal Involvement Pertinent to Current Situation/Hospitalization: No - Comment as  needed  Activities of Daily Living   ADL Screening (condition at time of admission) Independently performs ADLs?: Yes (appropriate for developmental age) Is the patient deaf or have difficulty hearing?: No Does the patient have difficulty seeing, even when wearing glasses/contacts?: No Does the patient have difficulty concentrating, remembering, or making decisions?: No  Permission Sought/Granted                  Emotional Assessment Appearance:: Appears stated age Attitude/Demeanor/Rapport: Engaged Affect (typically observed): Accepting Orientation: : Oriented to Self, Oriented to Place, Oriented to  Time, Oriented to Situation Alcohol / Substance Use: Not Applicable Psych Involvement: No (comment)  Admission diagnosis:  Atrial fibrillation (HCC) [I48.91] Atrial fibrillation with rapid ventricular response (HCC) [I48.91] Atrial fibrillation with RVR (HCC) [I48.91] Patient Active Problem List   Diagnosis Date Noted   Atrial fibrillation with RVR (HCC) 06/21/2024   CAP (community acquired pneumonia) 06/21/2024   Atrial fibrillation (HCC) 06/21/2024   Peripheral neuropathy 05/25/2024   Allergic dermatitis 05/11/2024   Recurrent non-small cell lung cancer (NSCLC) (HCC) 12/03/2023   COPD exacerbation (HCC) 09/14/2023   OSA (obstructive sleep apnea) 11/25/2022   Elevated coronary artery calcium  score 09/03/2022   Insomnia 11/09/2021   History of CVA with residual deficit 11/09/2021   Decreased peripheral vision of both eyes 11/09/2021  S/P appendectomy 11/09/2021   Infrarenal abdominal aortic aneurysm (AAA) without rupture 07/14/2021   Type 2 diabetes mellitus with other specified complication (HCC) 07/09/2021   Benign prostatic hyperplasia with nocturia 06/25/2021   Right thalamic infarction (HCC) 03/14/2021   Right sided weakness 03/12/2021   Aortic atherosclerosis 08/30/2020   Hyperlipidemia 11/17/2019   S/P Wedge Resection RUL, Blebectomy RUL 12/09/2018   Hearing  loss of right ear 06/04/2018   Primary lung adenocarcinoma (HCC) 10/15/2016   Encounter for therapeutic drug monitoring 09/16/2016   S/P lobectomy of lung 08/05/2016   Former smoker 06/11/2016   COPD GOLD 3 using 02 prn 06/11/2016   Essential (primary) hypertension 10/06/2014   PCP:  Davonna Siad, MD Pharmacy:   [facility] [address] [place] - [address] [place] [address] Phone: [phone] Fax: [phone]  [id number] - [place], [place] - [address] [place] [address] Phone: [phone] Fax: [phone]  [facility] - [place], [place] - [address] [facility] [place] [employer] [address] [place] [address] Phone: [phone] Fax: [phone]  [facility] [place], [place] - [address] [place] [address] Phone: [phone] Fax: [phone]  [facility] - [place], [place] - [address] [place] [address] Phone: [phone] Fax: [phone]     Social Drivers of Health (SDOH) Social History: SDOH Screenings   Food Insecurity: No Food Insecurity (06/21/2024)  Housing: Low Risk  (06/21/2024)  Transportation Needs: No Transportation Needs (06/21/2024)  Utilities: Not At Risk (06/21/2024)  Depression (PHQ2-9): Low Risk  (05/25/2024)  Financial Resource Strain: Medium Risk (05/10/2024)  Physical Activity: Inactive (05/10/2024)  Social Connections: Socially Isolated (06/21/2024)  Stress: No Stress Concern Present (05/10/2024)  Tobacco Use: Medium Risk (06/21/2024)   SDOH Interventions:     Readmission Risk Interventions    06/22/2024   10:41 AM  Readmission Risk Prevention Plan  Transportation Screening Complete  HRI or Home Care Consult Complete  Social Work Consult for Recovery Care Planning/Counseling Complete  Palliative Care Screening Not Applicable  Medication Review Oceanographer) Complete

## 2024-06-22 NOTE — Plan of Care (Signed)

## 2024-06-22 NOTE — Progress Notes (Signed)
 Mobility Specialist Progress Note:    06/22/24 0955  Mobility  Activity Ambulated with assistance  Level of Assistance Standby assist, set-up cues, supervision of patient - no hands on  Assistive Device Front wheel walker  Distance Ambulated (ft) 110 ft  Range of Motion/Exercises Active;All extremities  Activity Response Tolerated well  Mobility Referral Yes  Mobility visit 1 Mobility  Mobility Specialist Start Time (ACUTE ONLY) 0955  Mobility Specialist Stop Time (ACUTE ONLY) 1014  Mobility Specialist Time Calculation (min) (ACUTE ONLY) 19 min   Pt received in bed, agreeable to mobility. Required supervision to stand and ambulate with RW. Tolerated well, O2 sats below. Left sitting EOB, all needs met.  SpO2 88% on RA at rest. SpO2 91% on 1L at rest. SpO2 84% on 1L ambulation. SpO2 85% on 2L ambulation. SpO2 88-91% on 3L ambulation.  Abby Tucholski Mobility Specialist Please contact via Special educational needs teacher or  Rehab office at [phone]

## 2024-06-22 NOTE — Progress Notes (Signed)
 Physical Therapy Treatment Patient Details Name: Randall Sullivan MRN: [id number] DOB: 1951-02-13 Today's Date: 06/22/2024   History of Present Illness Randall Sullivan is a 73 year old with history of A-fib, on Coumadin , COPD,   AAA, lung cancer, HTN, HLD, PVD, prediabetic .[name]  Presenting to ED with chief complaint of shortness of breath, cough, and palpitation. Patient reports his symptoms has been progressively worsening in the past few days.  Denies any chest pain, denies any fever or chills, nausea or vomiting.  No excessive swelling in extremities    PT Comments  Pt was agreeable to completing today's PT treatment. He was able to demonstrate improved stability when completing sit to stand transfers and ambulation with supervision and without an AD. He was introduced to multiple new seated interventions once in the chair. He required minimal verbal cueing with these interventions for proper pacing to avoid a significant increase in fatigue. He reported feeling a little sore upon the conclusion of these interventions. He was left in the chair with the RN and his call bell within reach. Patient will benefit from continued skilled physical therapy in hospital and recommended venue below to increase strength, balance, endurance for safe ADLs and gait.     If plan is discharge home, recommend the following: A little help with walking and/or transfers;Assist for transportation;Help with stairs or ramp for entrance;A little help with bathing/dressing/bathroom   Can travel by private vehicle        Equipment Recommendations  None recommended by PT    Recommendations for Other Services       Precautions / Restrictions Precautions Precautions: Fall Recall of Precautions/Restrictions: Intact Restrictions Weight Bearing Restrictions Per Provider Order: No     Mobility  Bed Mobility Overal bed mobility: Independent                  Transfers Overall transfer level: Needs  assistance Equipment used: None Transfers: Sit to/from Stand, Bed to chair/wheelchair/BSC Sit to Stand: Supervision   Step pivot transfers: Supervision            Ambulation/Gait Ambulation/Gait assistance: Supervision Gait Distance (Feet): 6 Feet Assistive device: None Gait Pattern/deviations: Step-through pattern, Decreased stride length Gait velocity: decreased         Stairs             Wheelchair Mobility     Tilt Bed    Modified Rankin (Stroke Patients Only)       Balance Overall balance assessment: Needs assistance Sitting-balance support: No upper extremity supported, Feet supported Sitting balance-Leahy Scale: Good Sitting balance - Comments: seated at EOB   Standing balance support: During functional activity, No upper extremity supported Standing balance-Leahy Scale: Fair Standing balance comment: without AD                            Communication Communication Communication: No apparent difficulties  Cognition Arousal: Alert Behavior During Therapy: WFL for tasks assessed/performed   PT - Cognitive impairments: No apparent impairments                         Following commands: Intact      Cueing Cueing Techniques: Verbal cues  Exercises General Exercises - Lower Extremity Gluteal Sets: Both, Other reps (comment), Seated (30 reps) Long Arc Quad: Both, Other reps (comment), Seated (30 reps) Hip ABduction/ADduction: Both, Other reps (comment), Seated (30 reps) Hip Flexion/Marching: Both, Other reps (comment), Seated (  30 reps) Toe Raises: Both, Other reps (comment), Seated (30 reps) Heel Raises: Both, Other reps (comment), Seated (30 reps) Other Exercises Other Exercises: STS x 5 reps w/o UE support    General Comments        Pertinent Vitals/Pain Pain Assessment Pain Assessment: No/denies pain    Home Living                          Prior Function            PT Goals (current goals  can now be found in the care plan section) Acute Rehab PT Goals Patient Stated Goal: Pt wants to return home. PT Goal Formulation: With patient/family Time For Goal Achievement: 06/28/24 Potential to Achieve Goals: Good Progress towards PT goals: Progressing toward goals    Frequency    Min 3X/week      PT Plan      Co-evaluation              AM-PAC PT 6 Clicks Mobility   Outcome Measure  Help needed turning from your back to your side while in a flat bed without using bedrails?: None Help needed moving from lying on your back to sitting on the side of a flat bed without using bedrails?: None Help needed moving to and from a bed to a chair (including a wheelchair)?: None Help needed standing up from a chair using your arms (e.g., wheelchair or bedside chair)?: None Help needed to walk in hospital room?: A Little Help needed climbing 3-5 steps with a railing? : A Little 6 Click Score: 22    End of Session Equipment Utilized During Treatment: Oxygen  Activity Tolerance: Patient tolerated treatment well Patient left: in chair;with call bell/phone within reach;with nursing/sitter in room Nurse Communication: Mobility status PT Visit Diagnosis: Unsteadiness on feet (R26.81);Muscle weakness (generalized) (M62.81);Difficulty in walking, not elsewhere classified (R26.2)     Time: 8985-8961 PT Time Calculation (min) (ACUTE ONLY): 24 min  Charges:    $Therapeutic Exercise: 8-22 mins $Therapeutic Activity: 8-22 mins PT General Charges $$ ACUTE PT VISIT: 1 Visit                     Lacinda Fass, PT, DPT  06/22/2024, 10:58 AM

## 2024-06-22 NOTE — Progress Notes (Signed)
 PHARMACY - ANTICOAGULATION CONSULT NOTE  Pharmacy Consult for warfarin Indication: atrial fibrillation  No Known Allergies  Patient Measurements: Height: 5' 10 (177.8 cm) Weight: 96.4 kg (212 lb 8.4 oz) IBW/kg (Calculated) : 73 HEPARIN  DW (KG): 93.3  Vital Signs: Temp: 97.5 F (36.4 C) (09/23 0306) Temp Source: Oral (09/23 0306) BP: 155/81 (09/23 0306) Pulse Rate: 75 (09/23 0306)  Labs: Recent Labs    06/21/24 1139 06/21/24 1141 06/22/24 0401  HGB  --  11.8* 10.3*  HCT  --  38.2* 33.1*  PLT  --  212 187  LABPROT 26.8*  --  32.0*  INR 2.3*  --  2.9*  CREATININE  --  1.13 1.04  TROPONINIHS 16  --   --     Estimated Creatinine Clearance: 72.6 mL/min (by C-G formula based on SCr of 1.04 mg/dL).   Medical History: Past Medical History:  Diagnosis Date   A-fib (HCC) 2017   after last lung surgery patient had a history if Afib documented in hospital    AAA (abdominal aortic aneurysm)    4.0 cm 09/17/22 US    Acute appendicitis 07/14/2021   Arthritis    back & legs ( knees)   Cancer (HCC)    lung   Complication of anesthesia    agitated upon waking from anesth.    Dyspnea    Dysrhythmia    A. Fib   Emphysema    Emphysema of lung (HCC)    History of kidney stones    Hyperlipidemia    Hypertension    Oxygen  dependent    2L continuous   Peripheral vascular disease    Aortic Aneurysm   Pneumonia    Pre-diabetes    Sleep apnea    No OSA, but wears 2L  O2 continuous including at night   Stroke [facility]) 2022   Right sided weakness, decreased hearing, balance and memory loss   Tobacco abuse     Medications:  See med rec  Assessment: 73 yo male with history of a-fib on coumadin , and DOPD, AAA, Lung CA, HTN, HLD, PVD. Paitent with complaints of shortness of breath, cough, and palpitation. Patient reports his symptoms has been progressively worsening in the past few days. Pharmacy asked to dose warfarin   INR= 2.9, therapeutic but at upper end of goal now.    Home dose per anticoag clinic: 5mg  on MWF, and 2.5mg  all other days.  Goal of Therapy:  INR 2-3 Monitor platelets by anticoagulation protocol: Yes   Plan:  Warfarin 2.5mg  today x 1 Daily PT-INR Monitor for S/s of bleeding  Dempsey Blush PharmD., BCPS Clinical Pharmacist 06/22/2024 12:18 PM

## 2024-06-22 NOTE — Progress Notes (Signed)
 PROGRESS NOTE    Patient: Randall Sullivan                            PCP: Davonna Siad, MD                    DOB: September 30, 1951            DOA: 06/21/2024 FMW:[id number]             DOS: 06/22/2024, 9:59 AM   LOS: 1 day   Date of Service: The patient was seen and examined on 06/22/2024  Subjective:   The patient was seen and examined this morning. Hemodynamically stable.  Reporting improvement shortness of breath, denies any chest pain, satting 98% on 3 L of oxygen . Stating feeling better than yesterday No issues overnight .  Brief Narrative:   DENNIES COATE is a 73 year old with history of A-fib, on Coumadin , COPD,  AAA, lung cancer, HTN, HLD, PVD, prediabetic .[name] Presenting to ED with chief complaint of shortness of breath, cough, and palpitation. Patient reports his symptoms has been progressively worsening in the past few days.  Denies any chest pain, denies any fever or chills, nausea or vomiting.  No excessive swelling in extremities  ED evaluation: Blood pressure 119/71, pulse 87, temperature 97.9 F (36.6 C), resp. rate (!) 21, height 5' 10 (1.778 m), weight 98 kg, SpO2 96%.  LABs: WBC 10.3, hemoglobin 11.8, sodium 134, chloride 96, creatinine 1.13, INR 2.3, troponin 16, glucose 126, BNP 226 -Respiratory panel All negative - Chest x-ray;  Patchy right upper and right basilar airspace opacification, indicative of pneumonia. 2. Small loculated right pleural fluid. 3. Right upper lobe wedge resection.  Left upper lobectom  On arrival patient was found in A-fib with RVR, with a heart rate of 154 started on Cardizem  drip-currently 7  Requested patient to be admitted for rate control, acute on chronic respiratory failure, COPD exacerbation, treat for possible pneumonia    Assessment & Plan:   Principal Problem:   Atrial fibrillation with RVR (HCC) Active Problems:   Recurrent non-small cell lung cancer (NSCLC) (HCC)   Essential (primary) hypertension   Hyperlipidemia    Benign prostatic hyperplasia with nocturia   S/P lobectomy of lung   Type 2 diabetes mellitus with other specified complication (HCC)   Infrarenal abdominal aortic aneurysm (AAA) without rupture   Insomnia   History of CVA with residual deficit   COPD exacerbation (HCC)   Allergic dermatitis   Peripheral neuropathy   CAP (community acquired pneumonia)   Atrial fibrillation (HCC)     Assessment and Plan: * Atrial fibrillation with RVR (HCC) POA; in A-fib with RVR heart rate as high as 154 >> 87 Heart rate remained stable at 75 bpm today With history of A-fib -Likely exacerbated by shortness of breath, COPD exacerbation - Started on Cardizem  drip -successfully tapered off -Resume home medication: Including metoprolol  - Continue Coumadin  INR therapeutic 2.3  Recurrent non-small cell lung cancer (NSCLC) (HCC) -With history of lobe resection -Has been on treatment with chemotherapy - Patient is to follow-up with a primary oncologist   Benign prostatic hyperplasia with nocturia - Review home medication, continue Flomax , finasteride   Hyperlipidemia - Statins, fibrate, fish oil  Essential (primary) hypertension - Blood pressure stable review home medication -Will resume accordingly, currently blood pressure running soft on Cardizem  drip  CAP (community acquired pneumonia) - Possibly community-acquired pneumonia, chronic on chest x-ray - Chest  x-ray patchy right upper and right basilar airspace opacification,indicative of pneumonia.2. Small loculated right pleural fluid.3. Right upper lobe wedge resection.  Left upper lobectom -Remains stable, afebrile, normotensive -Lactic acid 1.0, procalcitonin 0.10 -Patient received IV azithromycin /Rocephin >> will switch to p.o. Levaquin  -Follow-up with cultures, will try to obtain sputum culture -Continue nebs, steroids  Peripheral neuropathy - Currently not on any medication, monitoring closely  COPD exacerbation (HCC) - Acute on  chronic respiratory failure, with history of COPD, currently COPD exacerbation -Currently satting 96% on 3 L of oxygen  -Continue supplemental oxygen  with goal O2 sat of 88-92% -Tapering off O2 supplements, IV steroids -Solu-Medrol  120 mg IV was given will continue to taper off -Continue with DuoNeb bronchodilators, mucolytics -On encouraging spirometer and flutter valve  History of CVA with residual deficit - Continue Coumadin , statins,  Insomnia - As needed trazodone   Infrarenal abdominal aortic aneurysm (AAA) without rupture - Monitoring closely -Keeping blood pressure check -Follow-up as an outpatient  Type 2 diabetes mellitus with other specified complication (HCC) - Prediabetic with hemoglobin of 5.4 -Since patient cannot be on steroids, anticipating hyperglycemia -Will check CBG Q ACHS, SSI coverage   S/P lobectomy of lung - Monitor closely, in respite distress due to pneumonia, COPD exacerbation   Nutritional status:  The patient's BMI is: Body mass index is 30.49 kg/m. -Diabetic, carb modified, cardiac diet Recommending weight loss   ----------------------------------------------------------------------------------------------------------  Cultures; Blood Cultures x 2 >>    DVT prophylaxis:  SCDs Start: 06/21/24 1308   Code Status:   Code Status: Full Code  Family Communication: No family member present at bedside-  -Advance care planning has been discussed.   Admission status:   Status is: Inpatient Remains inpatient appropriate because: Requiring IV antibiotics, breathing treatments   Disposition: From  - home             Planning for discharge in 1-2 days   Procedures:   No admission procedures for hospital encounter.   Antimicrobials:  Anti-infectives (From admission, onward)    Start     Dose/Rate Route Frequency Ordered Stop   06/22/24 1300  levofloxacin  (LEVAQUIN ) tablet 750 mg        750 mg Oral Daily 06/21/24 1311     06/21/24  1315  cefTRIAXone  (ROCEPHIN ) 1 g in sodium chloride  0.9 % 100 mL IVPB        1 g 200 mL/hr over 30 Minutes Intravenous  Once 06/21/24 1308 06/21/24 1356   06/21/24 1315  azithromycin  (ZITHROMAX ) 500 mg in sodium chloride  0.9 % 250 mL IVPB        500 mg 250 mL/hr over 60 Minutes Intravenous  Once 06/21/24 1308 06/21/24 1425        Medication:   albuterol   5 mg Nebulization Once   budesonide  (PULMICORT ) nebulizer solution  0.25 mg Nebulization BID   Chlorhexidine  Gluconate Cloth  6 each Topical Daily   fenofibrate   160 mg Oral Daily   finasteride   5 mg Oral Daily   guaiFENesin -dextromethorphan   10 mL Oral Q8H   insulin  aspart  0-20 Units Subcutaneous TID WC   ipratropium  0.5 mg Nebulization Q6H   levalbuterol   1.25 mg Nebulization Q6H   levofloxacin   750 mg Oral Daily   loratadine   10 mg Oral Daily   magnesium  oxide  400 mg Oral BID   methylPREDNISolone  sodium succinate  80 mg Intravenous Q12H   metoprolol  succinate  50 mg Oral Daily   rosuvastatin   40 mg Oral Daily  sodium chloride  flush  10-40 mL Intracatheter Q12H   sodium chloride  flush  3 mL Intravenous Q12H   sodium chloride  flush  3 mL Intravenous Q12H   tamsulosin   0.8 mg Oral Daily   Warfarin - Pharmacist Dosing Inpatient   Does not apply q1600    acetaminophen  **OR** acetaminophen , ALPRAZolam , bisacodyl , hydrALAZINE , HYDROmorphone  (DILAUDID ) injection, hydrOXYzine , metoprolol  tartrate, ondansetron  **OR** ondansetron  (ZOFRAN ) IV, mouth rinse, oxyCODONE , senna-docusate, sodium chloride  flush, sodium phosphate , traZODone    Objective:   Vitals:   06/21/24 2250 06/22/24 0306 06/22/24 0500 06/22/24 0741  BP: (!) 107/56 (!) 155/81    Pulse: 80 75    Resp: 20 20    Temp: 98 F (36.7 C) (!) 97.5 F (36.4 C)    TempSrc: Oral Oral    SpO2: 97% 98%  98%  Weight:   96.4 kg   Height:        Intake/Output Summary (Last 24 hours) at 06/22/2024 0959 Last data filed at 06/22/2024 0348 Gross per 24 hour  Intake 250.56 ml   Output --  Net 250.56 ml   Filed Weights   06/21/24 1120 06/22/24 0500  Weight: 98 kg 96.4 kg     Physical examination:   General:  AAO x 3,  cooperative, no distress;   HEENT:  Normocephalic, PERRL, otherwise with in Normal limits   Neuro:  CNII-XII intact. , normal motor and sensation, reflexes intact   Lungs:   Clear to auscultation BL, Respirations unlabored,  Diffuse rhonchi, minimal wheezing, minimal right lower lobe crackles  Cardio:    S1/S2, RRR, No murmure, No Rubs or Gallops   Abdomen:  Soft, non-tender, bowel sounds active all four quadrants, no guarding or peritoneal signs.  Muscular  skeletal:  Limited exam -global generalized weaknesses - in bed, able to move all 4 extremities,   2+ pulses,  symmetric, No pitting edema  Skin:  Dry, warm to touch, negative for any Rashes,  Wounds: Please see nursing documentation       ------------------------------------------------------------------------------------------------------------------------------------------    LABs:     Latest Ref Rng & Units 06/22/2024    4:01 AM 06/21/2024   11:41 AM 05/25/2024    9:22 AM  CBC  WBC 4.0 - 10.5 K/uL 8.6  10.3  6.8   Hemoglobin 13.0 - 17.0 g/dL 89.6  88.1  87.2   Hematocrit 39.0 - 52.0 % 33.1  38.2  39.9   Platelets 150 - 400 K/uL 187  212  188       Latest Ref Rng & Units 06/22/2024    4:01 AM 06/21/2024   11:41 AM 05/25/2024    9:22 AM  CMP  Glucose 70 - 99 mg/dL 831  879  882   BUN 8 - 23 mg/dL 26  21  20    Creatinine 0.61 - 1.24 mg/dL 8.95  8.86  8.95   Sodium 135 - 145 mmol/L 138  134  136   Potassium 3.5 - 5.1 mmol/L 4.5  4.3  3.9   Chloride 98 - 111 mmol/L 99  96  100   CO2 22 - 32 mmol/L 26  23  28    Calcium  8.9 - 10.3 mg/dL 8.8  9.0  9.1   Total Protein 6.5 - 8.1 g/dL   6.9   Total Bilirubin 0.0 - 1.2 mg/dL   0.9   Alkaline Phos 38 - 126 U/L   36   AST 15 - 41 U/L   23   ALT 0 - 44 U/L  18        Micro Results Recent Results (from the past 240  hours)  Resp panel by RT-PCR (RSV, Flu A&B, Covid) Anterior Nasal Swab     Status: None   Collection Time: 06/21/24 11:57 AM   Specimen: Anterior Nasal Swab  Result Value Ref Range Status   SARS Coronavirus 2 by RT PCR NEGATIVE NEGATIVE Final    Comment: (NOTE) SARS-CoV-2 target nucleic acids are NOT DETECTED.  The SARS-CoV-2 RNA is generally detectable in upper respiratory specimens during the acute phase of infection. The lowest concentration of SARS-CoV-2 viral copies this assay can detect is 138 copies/mL. A negative result does not preclude SARS-Cov-2 infection and should not be used as the sole basis for treatment or other patient management decisions. A negative result may occur with  improper specimen collection/handling, submission of specimen other than nasopharyngeal swab, presence of viral mutation(s) within the areas targeted by this assay, and inadequate number of viral copies(<138 copies/mL). A negative result must be combined with clinical observations, patient history, and epidemiological information. The expected result is Negative.  Fact Sheet for Patients:  BloggerCourse.com  Fact Sheet for Healthcare Providers:  SeriousBroker.it  This test is no t yet approved or cleared by the [place]  FDA and  has been authorized for detection and/or diagnosis of SARS-CoV-2 by FDA under an Emergency Use Authorization (EUA). This EUA will remain  in effect (meaning this test can be used) for the duration of the COVID-19 declaration under Section 564(b)(1) of the Act, 21 U.S.C.section 360bbb-3(b)(1), unless the authorization is terminated  or revoked sooner.       Influenza A by PCR NEGATIVE NEGATIVE Final   Influenza B by PCR NEGATIVE NEGATIVE Final    Comment: (NOTE) The Xpert Xpress SARS-CoV-2/FLU/RSV plus assay is intended as an aid in the diagnosis of influenza from Nasopharyngeal swab specimens and should not  be used as a sole basis for treatment. Nasal washings and aspirates are unacceptable for Xpert Xpress SARS-CoV-2/FLU/RSV testing.  Fact Sheet for Patients: BloggerCourse.com  Fact Sheet for Healthcare Providers: SeriousBroker.it  This test is not yet approved or cleared by the [place]  FDA and has been authorized for detection and/or diagnosis of SARS-CoV-2 by FDA under an Emergency Use Authorization (EUA). This EUA will remain in effect (meaning this test can be used) for the duration of the COVID-19 declaration under Section 564(b)(1) of the Act, 21 U.S.C. section 360bbb-3(b)(1), unless the authorization is terminated or revoked.     Resp Syncytial Virus by PCR NEGATIVE NEGATIVE Final    Comment: (NOTE) Fact Sheet for Patients: BloggerCourse.com  Fact Sheet for Healthcare Providers: SeriousBroker.it  This test is not yet approved or cleared by the [place]  FDA and has been authorized for detection and/or diagnosis of SARS-CoV-2 by FDA under an Emergency Use Authorization (EUA). This EUA will remain in effect (meaning this test can be used) for the duration of the COVID-19 declaration under Section 564(b)(1) of the Act, 21 U.S.C. section 360bbb-3(b)(1), unless the authorization is terminated or revoked.  Performed at [facility], [address], [place], [place] [address]     Radiology Reports DG Chest [place] 1 View Result Date: 06/21/2024 CLINICAL DATA:  Shortness of breath, tachycardia, lung cancer. EXAM: PORTABLE CHEST 1 VIEW COMPARISON:  CT chest 05/18/2024 and chest radiograph 10/21/2023. FINDINGS: Left IJ Port-A-Cath terminates in the low SVC. Trachea is midline. Heart size is accentuated by AP semi upright technique. Right upper lobe wedge resection. Patchy opacification in  the right upper lobe and right lung base, new from 05/18/2024. Right perihilar  consolidation, better seen on 05/18/2024. Rind of loculated pleural fluid in the right hemithorax left lung is grossly clear. Left upper lobectomy per report. No left pleural fluid. IMPRESSION: 1. Patchy right upper and right basilar airspace opacification, indicative of pneumonia. 2. Small loculated right pleural fluid. 3. Right upper lobe wedge resection.  Left upper lobectomy. 4. Nodular right perihilar consolidation, better evaluated on 05/18/2024 CT chest. Electronically Signed   By: Newell Eke M.D.   On: 06/21/2024 12:49    SIGNED: Adriana DELENA Grams, MD, FHM. FAAFP. [place] [facility] Time spent - 55 min.  In seeing, evaluating and examining the patient. Reviewing medical records, labs, drawn plan of care. [facility],  Pager (please use [email or web address] to page/ text) Please use Epic Secure Chat for non-urgent communication (7AM-7PM)  If 7PM-7AM, please contact night-coverage [email or web address], 06/22/2024, 9:59 AM

## 2024-06-23 DIAGNOSIS — I2510 Atherosclerotic heart disease of native coronary artery without angina pectoris: Secondary | ICD-10-CM | POA: Diagnosis not present

## 2024-06-23 DIAGNOSIS — I480 Paroxysmal atrial fibrillation: Secondary | ICD-10-CM

## 2024-06-23 DIAGNOSIS — I4891 Unspecified atrial fibrillation: Secondary | ICD-10-CM | POA: Diagnosis not present

## 2024-06-23 LAB — GLUCOSE, CAPILLARY
Glucose-Capillary: 167 mg/dL — ABNORMAL HIGH (ref 70–99)
Glucose-Capillary: 144 mg/dL — ABNORMAL HIGH (ref 70–99)
Glucose-Capillary: 149 mg/dL — ABNORMAL HIGH (ref 70–99)
Glucose-Capillary: 246 mg/dL — ABNORMAL HIGH (ref 70–99)

## 2024-06-23 LAB — MAGNESIUM: Magnesium: 2.1 mg/dL (ref 1.7–2.4)

## 2024-06-23 LAB — CBC
HCT: 32.7 % — ABNORMAL LOW (ref 39.0–52.0)
Hemoglobin: 10 g/dL — ABNORMAL LOW (ref 13.0–17.0)
MCH: 25.3 pg — ABNORMAL LOW (ref 26.0–34.0)
MCHC: 30.6 g/dL (ref 30.0–36.0)
MCV: 82.6 fL (ref 80.0–100.0)
Platelets: 197 K/uL (ref 150–400)
RBC: 3.96 MIL/uL — ABNORMAL LOW (ref 4.22–5.81)
RDW: 15.2 % (ref 11.5–15.5)
WBC: 12.2 K/uL — ABNORMAL HIGH (ref 4.0–10.5)
nRBC: 0 % (ref 0.0–0.2)

## 2024-06-23 LAB — BASIC METABOLIC PANEL WITH GFR
Anion gap: 12 (ref 5–15)
BUN: 37 mg/dL — ABNORMAL HIGH (ref 8–23)
CO2: 31 mmol/L (ref 22–32)
Calcium: 8.9 mg/dL (ref 8.9–10.3)
Chloride: 99 mmol/L (ref 98–111)
Creatinine, Ser: 1.1 mg/dL (ref 0.61–1.24)
GFR, Estimated: >60 mL/min (ref >60)
Glucose, Bld: 177 mg/dL — ABNORMAL HIGH (ref 70–99)
Potassium: 4.4 mmol/L (ref 3.5–5.1)
Sodium: 142 mmol/L (ref 135–145)

## 2024-06-23 LAB — BRAIN NATRIURETIC PEPTIDE: B Natriuretic Peptide: 256 pg/mL — ABNORMAL HIGH (ref 0.0–100.0)

## 2024-06-23 LAB — PROTIME-INR
INR: 3.9 — ABNORMAL HIGH (ref 0.8–1.2)
Prothrombin Time: 40.2 s — ABNORMAL HIGH (ref 11.4–15.2)

## 2024-06-23 MED ORDER — DILTIAZEM HCL 30 MG PO TABS
30.0000 mg | ORAL_TABLET | Freq: Four times a day (QID) | ORAL | Status: DC
  Administered 2024-06-23 – 2024-06-24 (×4): 30 mg via ORAL
  Filled 2024-06-23 (×4): qty 1

## 2024-06-23 MED ORDER — BENZONATATE 100 MG PO CAPS
200.0000 mg | ORAL_CAPSULE | Freq: Three times a day (TID) | ORAL | Status: DC
  Administered 2024-06-23 – 2024-06-26 (×10): 200 mg via ORAL
  Filled 2024-06-23 (×11): qty 2

## 2024-06-23 MED ORDER — FUROSEMIDE 10 MG/ML IJ SOLN
40.0000 mg | Freq: Once | INTRAMUSCULAR | Status: AC
  Administered 2024-06-23: 40 mg via INTRAVENOUS
  Filled 2024-06-23: qty 4

## 2024-06-23 NOTE — Progress Notes (Signed)
 Continues afib/rvr, sustaining in 130-140's.  EKG early this morning showed afib/rvr.  BP 122/84.  Gave schedule and prn metoprolol .  Contacted Dr. Willette and he ordered cardio consult

## 2024-06-23 NOTE — Care Management Important Message (Signed)
 Important Message  Patient Details  Name: THANE AGE MRN: [id number] Date of Birth: [birth year]   Important Message Given:  Yes - Medicare IM     Jenniffer Vessels L Tommi Crepeau 06/23/2024, 2:28 PM

## 2024-06-23 NOTE — Progress Notes (Signed)
 On morning vitals patient had HR of 140's.  EKG was Afib RVR, results on chart.  Patient was placed on cardiac monitor.  Physician notified.  Patient denies chest pain. Patient is Afib in the 70-low 100's.

## 2024-06-23 NOTE — Care Management Important Message (Signed)
 Important Message  Patient Details  Name: Randall Sullivan MRN: [id number] Date of Birth: 1950-11-13   Important Message Given:  N/A - LOS <3 / Initial given by admissions     [name] 06/23/2024, 11:30 AM

## 2024-06-23 NOTE — Consult Note (Signed)
 CARDIOLOGY CONSULTATION  Patient ID: GAJE TENNYSON; [id number]; 1951-01-25   Admit date: 06/21/2024 Date of Consult: 06/23/2024  Primary Care Provider: Davonna Siad, MD Primary Cardiologist: Lynwood Schilling, MD  HISTORY OF PRESENT ILLNESS  Mr. Littlejohn is a 73 y.o. male currently admitted to the hospital with community-acquired pneumonia and COPD exacerbation.  He has a history of recurrent non-small cell lung cancer, initially underwent left VATS in 2017 with lobectomy and lymph node resection, follow-up right VATS with right upper lobe nodule and bleb resection in 2020, status post chemoradiation with carboplatin  and paclitaxel , currently on Durvalumab  with follow-up by oncology.  Cardiology consulted to assist with management of atrial fibrillation/flutter.  He saw Dr. Schilling and December 2024 with cardiac history including paroxysmal atrial fibrillation and coronary artery calcification evident by CT imaging.  CHA2DS2-VASc score is 6 and he is on Coumadin  for stroke prophylaxis with follow-up in the anticoagulation clinic.  He has had atrial fibrillation and flutter with RVR during this hospitalization, initially on IV diltiazem  with rate control and then attempted conversion to oral metoprolol .  Heart rate elevated this morning although he is asymptomatic in terms of palpitations and otherwise hemodynamically stable.  ROS  Pertinent review in history of present illness.  Cough and chest congestion.  No dizziness or syncope.  Past Medical History:  Diagnosis Date   A-fib [facility]) 2017   after last lung surgery patient had a history if Afib documented in hospital    AAA (abdominal aortic aneurysm)    4.0 cm 09/17/22 US    Acute appendicitis 07/14/2021   Arthritis    back & legs ( knees)   Cancer (HCC)    lung   Complication of anesthesia    agitated upon waking from anesth.    Dyspnea    Dysrhythmia    A. Fib   Emphysema    Emphysema of lung (HCC)    History of kidney stones     Hyperlipidemia    Hypertension    Oxygen  dependent    2L continuous   Peripheral vascular disease    Aortic Aneurysm   Pneumonia    Pre-diabetes    Sleep apnea    No OSA, but wears 2L [place] O2 continuous including at night   Stroke [facility]) 2022   Right sided weakness, decreased hearing, balance and memory loss   Tobacco abuse     Past Surgical History:  Procedure Laterality Date   HERNIA REPAIR Bilateral 1999   umbilical   IR IMAGING GUIDED PORT INSERTION  12/09/2023   LAPAROSCOPIC APPENDECTOMY N/A 07/15/2021   Procedure: APPENDECTOMY LAPAROSCOPIC;  Surgeon: Kallie Manuelita BROCKS, MD;  Location: [facility];  Service: General;  Laterality: N/A;   VIDEO ASSISTED THORACOSCOPY Right 12/21/2018   Procedure: VIDEO ASSISTED THORACOSCOPY AND RESECTION OF BLEBS RIGHT LOWER LOBE;  Surgeon: Kerrin Elspeth BROCKS, MD;  Location: [facility] OR;  Service: Thoracic;  Laterality: Right;   VIDEO ASSISTED THORACOSCOPY (VATS)/ LOBECTOMY Left 08/01/2016   Procedure: VIDEO ASSISTED THORACOSCOPY (VATS)/ LEFT UPPER LOBECTOMY with lymph node sampling and onQ placement;  Surgeon: Elspeth BROCKS Kerrin, MD;  Location: [facility] OR;  Service: Thoracic;  Laterality: Left;   VIDEO ASSISTED THORACOSCOPY (VATS)/WEDGE RESECTION Right 12/09/2018   Procedure: VIDEO ASSISTED THORACOSCOPY (VATS)/WEDGE RESECTION;  Surgeon: Kerrin Elspeth BROCKS, MD;  Location: [facility] OR;  Service: Thoracic;  Laterality: Right;   VIDEO BRONCHOSCOPY Bilateral 12/21/2018   Procedure: VIDEO BRONCHOSCOPY;  Surgeon: Kerrin Elspeth BROCKS, MD;  Location: [facility] OR;  Service: Thoracic;  Laterality: Bilateral;  VIDEO BRONCHOSCOPY WITH ENDOBRONCHIAL NAVIGATION N/A 07/03/2016   Procedure: VIDEO BRONCHOSCOPY WITH ENDOBRONCHIAL NAVIGATION;  Surgeon: Elspeth JAYSON Millers, MD;  Location: [facility] OR;  Service: Thoracic;  Laterality: N/A;   VIDEO BRONCHOSCOPY WITH ENDOBRONCHIAL NAVIGATION N/A 10/24/2023   Procedure: VIDEO BRONCHOSCOPY WITH ENDOBRONCHIAL NAVIGATION;  Surgeon: Millers Elspeth JAYSON, MD;  Location: [facility] OR;  Service: Thoracic;  Laterality: N/A;   VIDEO BRONCHOSCOPY WITH ENDOBRONCHIAL ULTRASOUND N/A 10/24/2023   Procedure: VIDEO BRONCHOSCOPY WITH ENDOBRONCHIAL ULTRASOUND;  Surgeon: Millers Elspeth JAYSON, MD;  Location: [facility] OR;  Service: Thoracic;  Laterality: N/A;   VIDEO BRONCHOSCOPY WITH INSERTION OF INTERBRONCHIAL VALVE (IBV) N/A 12/11/2018   Procedure: VIDEO BRONCHOSCOPY WITH INSERTION OF INTERBRONCHIAL VALVE (IBV);  Surgeon: Millers Elspeth JAYSON, MD;  Location: [facility] OR;  Service: Thoracic;  Laterality: N/A;   VIDEO BRONCHOSCOPY WITH INSERTION OF INTERBRONCHIAL VALVE (IBV) N/A 02/10/2019   Procedure: VIDEO BRONCHOSCOPY WITH REMOVAL OF INTERBRONCHIAL VALVE (IBV);  Surgeon: Millers Elspeth JAYSON, MD;  Location: [facility] OR;  Service: Thoracic;  Laterality: N/A;     INPATIENT MEDICATIONS Scheduled Meds:  budesonide  (PULMICORT ) nebulizer solution  0.25 mg Nebulization BID   Chlorhexidine  Gluconate Cloth  6 each Topical Daily   diltiazem   30 mg Oral Q6H   fenofibrate   160 mg Oral Daily   finasteride   5 mg Oral Daily   guaiFENesin -dextromethorphan   10 mL Oral Q8H   insulin  aspart  0-20 Units Subcutaneous TID WC   ipratropium  0.5 mg Nebulization Q6H   levalbuterol   1.25 mg Nebulization Q6H   levofloxacin   750 mg Oral Daily   loratadine   10 mg Oral Daily   magnesium  oxide  400 mg Oral BID   methylPREDNISolone  sodium succinate  40 mg Intravenous Q12H   metoprolol  succinate  100 mg Oral Daily   rosuvastatin   40 mg Oral Daily   sodium chloride  flush  10-40 mL Intracatheter Q12H   tamsulosin   0.8 mg Oral Daily   Warfarin - Pharmacist Dosing Inpatient   Does not apply q1600    PRN Meds: acetaminophen  **OR** acetaminophen , ALPRAZolam , bisacodyl , hydrALAZINE , HYDROmorphone  (DILAUDID ) injection, hydrOXYzine , metoprolol  tartrate, ondansetron  **OR** ondansetron  (ZOFRAN ) IV, mouth rinse, oxyCODONE , senna-docusate, sodium chloride  flush, traZODone   ALLERGIES No Known  Allergies  SOCIAL HISTORY  Social History   Tobacco Use   Smoking status: Former    Current packs/day: 0.00    Average packs/day: 1 pack/day for 40.0 years (40.0 ttl pk-yrs)    Types: Cigarettes    Start date: 05/28/1976    Quit date: 05/28/2016    Years since quitting: 8.0   Smokeless tobacco: Never  Substance Use Topics   Alcohol use: Not Currently    Comment: quit- 2016    FAMILY HISTORY   The patient's family history includes Aneurysm in his sister; Arthritis in his brother; Asthma in his brother; Cancer in his brother, mother, and sister; Cancer - Lung in his brother; Kidney disease in his paternal aunt.  PHYSICAL EXAM & DATA  Vitals:   06/23/24 0547 06/23/24 0619 06/23/24 0735 06/23/24 0832  BP: 119/88 (!) 138/97  122/84  Pulse: (!) 141 78  (!) 145  Resp: 18 18    Temp: 97.9 F (36.6 C) 97.8 F (36.6 C)    TempSrc: Oral Oral    SpO2: 91% 97% 98%   Weight:      Height:       No intake or output data in the 24 hours ending 06/23/24 0949 Filed Weights   06/21/24 1120 06/22/24 0500 06/23/24  0500  Weight: 98 kg 96.4 kg 95.6 kg   Body mass index is 30.24 kg/m.   Gen: Patient appears comfortable at rest. HEENT: Conjunctiva and lids normal, oropharynx clear. Neck: Supple, no elevated JVP or carotid bruits. Lungs: Coarse breath sounds with scattered rhonchi, no wheezing. Cardiac: Irregularly irregular, no S3, soft systolic murmur. Abdomen: Soft, nontender, bowel sounds present. Extremities: Mild ankle edema, distal pulses 2+. Skin: Warm and dry. Musculoskeletal: No kyphosis. Neuropsychiatric: Alert and oriented x3, affect grossly appropriate.  EKG:  An ECG dated 06/23/2024 was personally reviewed today and demonstrated:  Typical atrial flutter with variable block.  Telemetry:  I personally reviewed telemetry which shows atrial flutter with variable block.  RELEVANT CV STUDIES  Echocardiogram 03/13/2021:  1. Left ventricular ejection fraction, by estimation, is  50 to 55%. The  left ventricle has low normal function. Left ventricular endocardial  border not optimally defined to evaluate regional wall motion. Left  ventricular diastolic parameters are  consistent with Grade I diastolic dysfunction (impaired relaxation).   2. Right ventricular systolic function is normal. The right ventricular  size is normal.   3. The mitral valve is normal in structure. No evidence of mitral valve  regurgitation. No evidence of mitral stenosis.   4. The aortic valve is tricuspid. Aortic valve regurgitation is not  visualized. No aortic stenosis is present.   5. The inferior vena cava is normal in size with greater than 50%  respiratory variability, suggesting right atrial pressure of 3 mmHg.   6. Agitated saline contrast bubble study was negative, with no evidence  of any interatrial shunt.   LABORATORY DATA  Chemistry Recent Labs  Lab 06/21/24 1141 06/22/24 0401 06/23/24 0402  NA 134* 138 142  K 4.3 4.5 4.4  CL 96* 99 99  CO2 23 26 31   GLUCOSE 120* 168* 177*  BUN 21 26* 37*  CREATININE 1.13 1.04 1.10  CALCIUM  9.0 8.8* 8.9  GFRNONAA >60 >60 >60  ANIONGAP 15 13 12      Hematology Recent Labs  Lab 06/21/24 1141 06/22/24 0401 06/23/24 0402  WBC 10.3 8.6 12.2*  RBC 4.66 4.03* 3.96*  HGB 11.8* 10.3* 10.0*  HCT 38.2* 33.1* 32.7*  MCV 82.0 82.1 82.6  MCH 25.3* 25.6* 25.3*  MCHC 30.9 31.1 30.6  RDW 15.6* 15.4 15.2  PLT 212 187 197   Cardiac Enzymes Recent Labs  Lab 06/21/24 1139  TROPONINIHS 16   BNP Recent Labs  Lab 06/21/24 1140 06/22/24 0401 06/23/24 0402  BNP 226.0* 211.0* 256.0*     Lipid Panel     Component Value Date/Time   CHOL 95 06/22/2024 0401   CHOL 188 07/15/2023 1142   TRIG 74 06/22/2024 0401   HDL 37 (L) 06/22/2024 0401   HDL 33 (L) 07/15/2023 1142   CHOLHDL 2.6 06/22/2024 0401   VLDL 15 06/22/2024 0401   LDLCALC 43 06/22/2024 0401   LDLCALC 101 (H) 07/15/2023 1142   LDLCALC  05/08/2020 1051     Comment:      . LDL cholesterol not calculated. Triglyceride levels greater than 400 mg/dL invalidate calculated LDL results. . Reference range: <100 . Desirable range <100 mg/dL for primary prevention;   <70 mg/dL for patients with CHD or diabetic patients  with > or = 2 CHD risk factors. SABRA LDL-C is now calculated using the Martin-Hopkins  calculation, which is a validated novel method providing  better accuracy than the Friedewald equation in the  estimation of LDL-C.  Gladis APPLETHWAITE et  al. JAMA. 7986;689(80): 2061-2068  (http://education.[facility].com/faq/FAQ164)    LDLDIRECT 75.9 03/12/2021 1725   LABVLDL 54 (H) 07/15/2023 1142    RADIOLOGY/STUDIES DG Chest Port 1 View Result Date: 06/21/2024 CLINICAL DATA:  Shortness of breath, tachycardia, lung cancer. EXAM: PORTABLE CHEST 1 VIEW COMPARISON:  CT chest 05/18/2024 and chest radiograph 10/21/2023. FINDINGS: Left IJ Port-A-Cath terminates in the low SVC. Trachea is midline. Heart size is accentuated by AP semi upright technique. Right upper lobe wedge resection. Patchy opacification in the right upper lobe and right lung base, new from 05/18/2024. Right perihilar consolidation, better seen on 05/18/2024. Rind of loculated pleural fluid in the right hemithorax left lung is grossly clear. Left upper lobectomy per report. No left pleural fluid. IMPRESSION: 1. Patchy right upper and right basilar airspace opacification, indicative of pneumonia. 2. Small loculated right pleural fluid. 3. Right upper lobe wedge resection.  Left upper lobectomy. 4. Nodular right perihilar consolidation, better evaluated on 05/18/2024 CT chest. Electronically Signed   By: Newell Eke M.D.   On: 06/21/2024 12:49    ASSESSMENT & PLAN  1.  Paroxysmal to persistent atrial fibrillation and typical atrial flutter with intermittent RVR.  CHA2DS2-VASc score is 6 and he is on Coumadin  for stroke prophylaxis.  Transiently on IV diltiazem  during this hospitalization with  adequate heart rate control, heart rate trending up now off infusion with resumption of oral Toprol -XL.  INR 3.9 with follow-up by pharmacy.  2.  Coronary artery calcification and severe aortic atherosclerosis evident by CT imaging.  Currently on Crestor  40 mg daily with LDL 43.  3.  Community-acquired pneumonia and COPD exacerbation.  Chronic lung disease includes recurrent non-small cell lung cancer under treatment with follow-up by oncology.  4.  Primary hypertension.  Plan to focus on heart rate control if possible, otherwise continue anticoagulation.  Likelihood of maintaining sinus rhythm even on antiarrhythmic therapy is fairly low in light of his significant chronic lung disease (and choices would be limited, possibly Tikosyn or Multaq).  Plan to start diltiazem  30 mg p.o. every 6 hours and consolidate to once daily CD dosing if tolerated.  Continue Toprol -XL for now (he was on this at home).  For questions or updates, please contact [facility] Please consult [email or web address] for contact info under   Signed, Jayson Sierras, MD  06/23/2024 9:49 AM

## 2024-06-23 NOTE — Progress Notes (Signed)
 PROGRESS NOTE    Patient: Randall Sullivan                            PCP: Davonna Siad, MD                    DOB: 1951-01-25            DOA: 06/21/2024 FMW:[id number]             DOS: 06/23/2024, 1:45 PM   LOS: 2 days   Date of Service: The patient was seen and examined on 06/23/2024  Subjective:   The patient was seen and examined this morning, stable no acute distress. Earlier this morning patient was complaining of palpitation, was found in A-fib with RVR   Brief Narrative:   DAISUKE BAILEY is a 73 year old with history of A-fib, on Coumadin , COPD,  AAA, lung cancer, HTN, HLD, PVD, prediabetic .[name] Presenting to ED with chief complaint of shortness of breath, cough, and palpitation. Patient reports his symptoms has been progressively worsening in the past few days.  Denies any chest pain, denies any fever or chills, nausea or vomiting.  No excessive swelling in extremities  ED evaluation: Blood pressure 119/71, pulse 87, temperature 97.9 F (36.6 C), resp. rate (!) 21, height 5' 10 (1.778 m), weight 98 kg, SpO2 96%.  LABs: WBC 10.3, hemoglobin 11.8, sodium 134, chloride 96, creatinine 1.13, INR 2.3, troponin 16, glucose 126, BNP 226 -Respiratory panel All negative - Chest x-ray;  Patchy right upper and right basilar airspace opacification, indicative of pneumonia. 2. Small loculated right pleural fluid. 3. Right upper lobe wedge resection.  Left upper lobectom  On arrival patient was found in A-fib with RVR, with a heart rate of 154 started on Cardizem  drip-currently 7  Requested patient to be admitted for rate control, acute on chronic respiratory failure, COPD exacerbation, treat for possible pneumonia    Assessment & Plan:   Principal Problem:   Atrial fibrillation with RVR (HCC) Active Problems:   Recurrent non-small cell lung cancer (NSCLC) (HCC)   Essential (primary) hypertension   Hyperlipidemia   Benign prostatic hyperplasia with nocturia   S/P lobectomy of  lung   Type 2 diabetes mellitus with other specified complication (HCC)   Infrarenal abdominal aortic aneurysm (AAA) without rupture   Insomnia   History of CVA with residual deficit   COPD exacerbation (HCC)   Allergic dermatitis   Peripheral neuropathy   CAP (community acquired pneumonia)   Atrial fibrillation (HCC)     Assessment and Plan: * Atrial fibrillation with RVR (HCC) -The morning back to A-fib with RVR, heart rate as high as 140s POA; in A-fib with RVR heart rate as high as 154 >> 87 -History of A-fib-exacerbated by underlying lung disease, COPD exacerbation, hypoxia - -POA was started on Cardizem  drip was tapered off-  Cardiology was consulted, restarted on p.o. Cardizem  30 mg  every 6 hours - Continue Toprol -XL at 100 mg p.o. daily  - Continue Coumadin  INR therapeutic 2.9  Elevated BNP -IV Lasix  x 2  Acute on chronic respiratory failure secondary to COPD exacerbation (HCC)/PNA - Acute on chronic respiratory failure, with history of COPD, currently COPD exacerbation -Remains stable on 3 L of oxygen  satting 96 -Continue supplemental oxygen  with goal O2 sat of 88-92% -Tapering off O2 supplements, IV steroids -Solu-Medrol  120 mg IV -tapering down -Continue with DuoNeb bronchodilators, mucolytics -On encouraging spirometer and flutter valve  Recurrent non-small cell lung cancer (NSCLC) (HCC) -With history of lobe resection -Has been on treatment with chemotherapy - Patient is to follow-up with a primary oncologist   Benign prostatic hyperplasia with nocturia - Review home medication, continue Flomax , finasteride   Hyperlipidemia - Statins, fibrate, fish oil  Essential (primary) hypertension - Blood pressure stable review home medication -Will resume accordingly, currently blood pressure running soft on Cardizem  drip  CAP (community acquired pneumonia) with history of lung CA and COPD - Possibly community-acquired pneumonia, chronic on chest x-ray -  Chest x-ray patchy right upper and right basilar airspace opacification,indicative of pneumonia.2. Small loculated right pleural fluid.3. Right upper lobe wedge resection.  Left upper lobectom -Remains stable, afebrile, normotensive -Lactic acid 1.0, procalcitonin 0.10 -Patient received IV azithromycin /Rocephin >> will switch to p.o. Levaquin  Will continue antibiotics -Follow-up with cultures, will try to obtain sputum culture -Continue nebs, steroids  Peripheral neuropathy - Currently not on any medication, monitoring closely  History of CVA with residual deficit - Continue Coumadin , statins,  Insomnia - As needed trazodone   Infrarenal abdominal aortic aneurysm (AAA) without rupture - Monitoring closely -Keeping blood pressure check -Follow-up as an outpatient  Type 2 diabetes mellitus with other specified complication (HCC) - Prediabetic with hemoglobin of 5.4 -Since patient cannot be on steroids, anticipating hyperglycemia -Will check CBG Q ACHS, SSI coverage   S/P lobectomy of lung - Monitor closely, in respite distress due to pneumonia, COPD exacerbation   Nutritional status:  The patient's BMI is: Body mass index is 30.24 kg/m. -Diabetic, carb modified, cardiac diet Recommending weight loss   ----------------------------------------------------------------------------------------------------------  Cultures; Blood Cultures x 2 >> no growth to date    DVT prophylaxis:  SCDs Start: 06/21/24 1308   Code Status:   Code Status: Full Code  Family Communication: No family member present at bedside-  -Advance care planning has been discussed.   Admission status:   Status is: Inpatient Remains inpatient appropriate because: Requiring IV antibiotics, breathing treatments   Disposition: From  - home             Planning for discharge in 1-2 days   Procedures:   No admission procedures for hospital encounter.   Antimicrobials:  Anti-infectives (From  admission, onward)    Start     Dose/Rate Route Frequency Ordered Stop   06/22/24 1300  levofloxacin  (LEVAQUIN ) tablet 750 mg        750 mg Oral Daily 06/21/24 1311     06/21/24 1315  cefTRIAXone  (ROCEPHIN ) 1 g in sodium chloride  0.9 % 100 mL IVPB        1 g 200 mL/hr over 30 Minutes Intravenous  Once 06/21/24 1308 06/21/24 1356   06/21/24 1315  azithromycin  (ZITHROMAX ) 500 mg in sodium chloride  0.9 % 250 mL IVPB        500 mg 250 mL/hr over 60 Minutes Intravenous  Once 06/21/24 1308 06/21/24 1425        Medication:   benzonatate   200 mg Oral TID   budesonide  (PULMICORT ) nebulizer solution  0.25 mg Nebulization BID   Chlorhexidine  Gluconate Cloth  6 each Topical Daily   diltiazem   30 mg Oral Q6H   fenofibrate   160 mg Oral Daily   finasteride   5 mg Oral Daily   guaiFENesin -dextromethorphan   10 mL Oral Q8H   insulin  aspart  0-20 Units Subcutaneous TID WC   ipratropium  0.5 mg Nebulization Q6H   levalbuterol   1.25 mg Nebulization Q6H   levofloxacin   750 mg Oral Daily  loratadine   10 mg Oral Daily   magnesium  oxide  400 mg Oral BID   methylPREDNISolone  sodium succinate  40 mg Intravenous Q12H   metoprolol  succinate  100 mg Oral Daily   rosuvastatin   40 mg Oral Daily   sodium chloride  flush  10-40 mL Intracatheter Q12H   tamsulosin   0.8 mg Oral Daily   Warfarin - Pharmacist Dosing Inpatient   Does not apply q1600    acetaminophen  **OR** acetaminophen , ALPRAZolam , bisacodyl , hydrALAZINE , HYDROmorphone  (DILAUDID ) injection, hydrOXYzine , metoprolol  tartrate, ondansetron  **OR** ondansetron  (ZOFRAN ) IV, mouth rinse, oxyCODONE , senna-docusate, sodium chloride  flush, traZODone    Objective:   Vitals:   06/23/24 0619 06/23/24 0735 06/23/24 0832 06/23/24 1329  BP: (!) 138/97  122/84 106/67  Pulse: 78  (!) 145 79  Resp: 18   20  Temp: 97.8 F (36.6 C)   98.7 F (37.1 C)  TempSrc: Oral   Oral  SpO2: 97% 98%  96%  Weight:      Height:        Intake/Output Summary (Last 24  hours) at 06/23/2024 1345 Last data filed at 06/23/2024 1300 Gross per 24 hour  Intake 480 ml  Output --  Net 480 ml   Filed Weights   06/21/24 1120 06/22/24 0500 06/23/24 0500  Weight: 98 kg 96.4 kg 95.6 kg     Physical examination:    General:  AAO x 3,  cooperative, no distress;   HEENT:  Normocephalic, PERRL, otherwise with in Normal limits   Neuro:  CNII-XII intact. , normal motor and sensation, reflexes intact   Lungs:   Clear to auscultation BL, Respirations unlabored,  No wheezes / crackles  Cardio:    Tachycardic, irregularly irregular  no murmure, No Rubs or Gallops   Abdomen:  Soft, non-tender, bowel sounds active all four quadrants, no guarding or peritoneal signs.  Muscular  skeletal:  Limited exam -global generalized weaknesses - in bed, able to move all 4 extremities,   2+ pulses,  symmetric, No pitting edema  Skin:  Dry, warm to touch, negative for any Rashes,  Wounds: Please see nursing documentation     -------------------------------------------------------------------------------------------------------------------------    LABs:     Latest Ref Rng & Units 06/23/2024    4:02 AM 06/22/2024    4:01 AM 06/21/2024   11:41 AM  CBC  WBC 4.0 - 10.5 K/uL 12.2  8.6  10.3   Hemoglobin 13.0 - 17.0 g/dL 89.9  89.6  88.1   Hematocrit 39.0 - 52.0 % 32.7  33.1  38.2   Platelets 150 - 400 K/uL 197  187  212       Latest Ref Rng & Units 06/23/2024    4:02 AM 06/22/2024    4:01 AM 06/21/2024   11:41 AM  CMP  Glucose 70 - 99 mg/dL 822  831  879   BUN 8 - 23 mg/dL 37  26  21   Creatinine 0.61 - 1.24 mg/dL 8.89  8.95  8.86   Sodium 135 - 145 mmol/L 142  138  134   Potassium 3.5 - 5.1 mmol/L 4.4  4.5  4.3   Chloride 98 - 111 mmol/L 99  99  96   CO2 22 - 32 mmol/L 31  26  23    Calcium  8.9 - 10.3 mg/dL 8.9  8.8  9.0        Micro Results Recent Results (from the past 240 hours)  Resp panel by RT-PCR (RSV, Flu A&B, Covid) Anterior Nasal Swab  Status: None    Collection Time: 06/21/24 11:57 AM   Specimen: Anterior Nasal Swab  Result Value Ref Range Status   SARS Coronavirus 2 by RT PCR NEGATIVE NEGATIVE Final    Comment: (NOTE) SARS-CoV-2 target nucleic acids are NOT DETECTED.  The SARS-CoV-2 RNA is generally detectable in upper respiratory specimens during the acute phase of infection. The lowest concentration of SARS-CoV-2 viral copies this assay can detect is 138 copies/mL. A negative result does not preclude SARS-Cov-2 infection and should not be used as the sole basis for treatment or other patient management decisions. A negative result may occur with  improper specimen collection/handling, submission of specimen other than nasopharyngeal swab, presence of viral mutation(s) within the areas targeted by this assay, and inadequate number of viral copies(<138 copies/mL). A negative result must be combined with clinical observations, patient history, and epidemiological information. The expected result is Negative.  Fact Sheet for Patients:  BloggerCourse.com  Fact Sheet for Healthcare Providers:  SeriousBroker.it  This test is no t yet approved or cleared by the [place]  FDA and  has been authorized for detection and/or diagnosis of SARS-CoV-2 by FDA under an Emergency Use Authorization (EUA). This EUA will remain  in effect (meaning this test can be used) for the duration of the COVID-19 declaration under Section 564(b)(1) of the Act, 21 U.S.C.section 360bbb-3(b)(1), unless the authorization is terminated  or revoked sooner.       Influenza A by PCR NEGATIVE NEGATIVE Final   Influenza B by PCR NEGATIVE NEGATIVE Final    Comment: (NOTE) The Xpert Xpress SARS-CoV-2/FLU/RSV plus assay is intended as an aid in the diagnosis of influenza from Nasopharyngeal swab specimens and should not be used as a sole basis for treatment. Nasal washings and aspirates are unacceptable for  Xpert Xpress SARS-CoV-2/FLU/RSV testing.  Fact Sheet for Patients: BloggerCourse.com  Fact Sheet for Healthcare Providers: SeriousBroker.it  This test is not yet approved or cleared by the [place]  FDA and has been authorized for detection and/or diagnosis of SARS-CoV-2 by FDA under an Emergency Use Authorization (EUA). This EUA will remain in effect (meaning this test can be used) for the duration of the COVID-19 declaration under Section 564(b)(1) of the Act, 21 U.S.C. section 360bbb-3(b)(1), unless the authorization is terminated or revoked.     Resp Syncytial Virus by PCR NEGATIVE NEGATIVE Final    Comment: (NOTE) Fact Sheet for Patients: BloggerCourse.com  Fact Sheet for Healthcare Providers: SeriousBroker.it  This test is not yet approved or cleared by the [place]  FDA and has been authorized for detection and/or diagnosis of SARS-CoV-2 by FDA under an Emergency Use Authorization (EUA). This EUA will remain in effect (meaning this test can be used) for the duration of the COVID-19 declaration under Section 564(b)(1) of the Act, 21 U.S.C. section 360bbb-3(b)(1), unless the authorization is terminated or revoked.  Performed at [facility], [address], [place], [place] [address]     Radiology Reports No results found.   SIGNED: Adriana DELENA Grams, MD, FHM. FAAFP. [place] [facility] Time spent - 55 min.  In seeing, evaluating and examining the patient. Reviewing medical records, labs, drawn plan of care. [facility],  Pager (please use [email or web address] to page/ text) Please use Epic Secure Chat for non-urgent communication (7AM-7PM)  If 7PM-7AM, please contact night-coverage [email or web address], 06/23/2024, 1:45 PM

## 2024-06-23 NOTE — Progress Notes (Signed)
 PHARMACY - ANTICOAGULATION CONSULT NOTE  Pharmacy Consult for warfarin Indication: atrial fibrillation  No Known Allergies  Patient Measurements: Height: 5' 10 (177.8 cm) Weight: 95.6 kg (210 lb 12.2 oz) IBW/kg (Calculated) : 73 HEPARIN  DW (KG): 93.3  Vital Signs: Temp: 97.8 F (36.6 C) (09/24 0619) Temp Source: Oral (09/24 0619) BP: 138/97 (09/24 0619) Pulse Rate: 78 (09/24 0619)  Labs: Recent Labs    06/21/24 1139 06/21/24 1141 06/21/24 1141 06/22/24 0401 06/23/24 0402  HGB  --  11.8*   < > 10.3* 10.0*  HCT  --  38.2*  --  33.1* 32.7*  PLT  --  212  --  187 197  LABPROT 26.8*  --   --  32.0* 40.2*  INR 2.3*  --   --  2.9* 3.9*  CREATININE  --  1.13  --  1.04 1.10  TROPONINIHS 16  --   --   --   --    < > = values in this interval not displayed.    Estimated Creatinine Clearance: 68.3 mL/min (by C-G formula based on SCr of 1.1 mg/dL).   Medical History: Past Medical History:  Diagnosis Date   A-fib (HCC) 2017   after last lung surgery patient had a history if Afib documented in hospital    AAA (abdominal aortic aneurysm)    4.0 cm 09/17/22 US    Acute appendicitis 07/14/2021   Arthritis    back & legs ( knees)   Cancer (HCC)    lung   Complication of anesthesia    agitated upon waking from anesth.    Dyspnea    Dysrhythmia    A. Fib   Emphysema    Emphysema of lung (HCC)    History of kidney stones    Hyperlipidemia    Hypertension    Oxygen  dependent    2L continuous   Peripheral vascular disease    Aortic Aneurysm   Pneumonia    Pre-diabetes    Sleep apnea    No OSA, but wears 2L [place] O2 continuous including at night   Stroke [facility]) 2022   Right sided weakness, decreased hearing, balance and memory loss   Tobacco abuse     Medications:  See med rec  Assessment: 73 yo male with history of a-fib on coumadin , and DOPD, AAA, Lung CA, HTN, HLD, PVD. Paitent with complaints of shortness of breath, cough, and palpitation. Patient reports his  symptoms has been progressively worsening in the past few days. Pharmacy asked to dose warfarin   INR= 2.3> 2.9> 3.9, supratherapeutic . Likely somewhat d/t antibiotics . No warfarin today  Home dose per anticoag clinic: 5mg  on MWF, and 2.5mg  all other days.  Goal of Therapy:  INR 2-3 Monitor platelets by anticoagulation protocol: Yes   Plan:  No Warfarin today Daily PT-INR Monitor for S/s of bleeding  Cherlyn Boers, BS Pharm D, BCPS Clinical Pharmacist 06/23/2024 7:56 AM

## 2024-06-24 ENCOUNTER — Other Ambulatory Visit (HOSPITAL_COMMUNITY): Payer: Self-pay | Admitting: *Deleted

## 2024-06-24 ENCOUNTER — Inpatient Hospital Stay (HOSPITAL_COMMUNITY)

## 2024-06-24 ENCOUNTER — Other Ambulatory Visit (HOSPITAL_COMMUNITY): Payer: Self-pay

## 2024-06-24 DIAGNOSIS — I4891 Unspecified atrial fibrillation: Secondary | ICD-10-CM | POA: Diagnosis not present

## 2024-06-24 DIAGNOSIS — I480 Paroxysmal atrial fibrillation: Secondary | ICD-10-CM | POA: Diagnosis not present

## 2024-06-24 DIAGNOSIS — I4892 Unspecified atrial flutter: Secondary | ICD-10-CM

## 2024-06-24 LAB — GLUCOSE, CAPILLARY
Glucose-Capillary: 143 mg/dL — ABNORMAL HIGH (ref 70–99)
Glucose-Capillary: 216 mg/dL — ABNORMAL HIGH (ref 70–99)
Glucose-Capillary: 188 mg/dL — ABNORMAL HIGH (ref 70–99)
Glucose-Capillary: 143 mg/dL — ABNORMAL HIGH (ref 70–99)

## 2024-06-24 LAB — CBC
HCT: 34.5 % — ABNORMAL LOW (ref 39.0–52.0)
Hemoglobin: 10.5 g/dL — ABNORMAL LOW (ref 13.0–17.0)
MCH: 25.2 pg — ABNORMAL LOW (ref 26.0–34.0)
MCHC: 30.4 g/dL (ref 30.0–36.0)
MCV: 82.7 fL (ref 80.0–100.0)
Platelets: 233 K/uL (ref 150–400)
RBC: 4.17 MIL/uL — ABNORMAL LOW (ref 4.22–5.81)
RDW: 15.3 % (ref 11.5–15.5)
WBC: 12.1 K/uL — ABNORMAL HIGH (ref 4.0–10.5)
nRBC: 0 % (ref 0.0–0.2)

## 2024-06-24 LAB — PROTIME-INR
INR: 3.8 — ABNORMAL HIGH (ref 0.8–1.2)
Prothrombin Time: 39.2 s — ABNORMAL HIGH (ref 11.4–15.2)

## 2024-06-24 LAB — ECHOCARDIOGRAM COMPLETE
AR max vel: 1.63 cm2
AV Area VTI: 1.64 cm2
AV Area mean vel: 1.63 cm2
AV Mean grad: 4 mmHg
AV Peak grad: 7.7 mmHg
Ao pk vel: 1.39 m/s
Area-P 1/2: 4.89 cm2
Height: 70 in
S' Lateral: 3.3 cm
Weight: 3361.57 [oz_av]

## 2024-06-24 LAB — BRAIN NATRIURETIC PEPTIDE: B Natriuretic Peptide: 379 pg/mL — ABNORMAL HIGH (ref 0.0–100.0)

## 2024-06-24 LAB — BASIC METABOLIC PANEL WITH GFR
Anion gap: 11 (ref 5–15)
BUN: 42 mg/dL — ABNORMAL HIGH (ref 8–23)
CO2: 32 mmol/L (ref 22–32)
Calcium: 8.8 mg/dL — ABNORMAL LOW (ref 8.9–10.3)
Chloride: 97 mmol/L — ABNORMAL LOW (ref 98–111)
Creatinine, Ser: 1.13 mg/dL (ref 0.61–1.24)
GFR, Estimated: >60 mL/min (ref >60)
Glucose, Bld: 182 mg/dL — ABNORMAL HIGH (ref 70–99)
Potassium: 4.1 mmol/L (ref 3.5–5.1)
Sodium: 140 mmol/L (ref 135–145)

## 2024-06-24 MED ORDER — METHYLPREDNISOLONE 4 MG PO TBPK: ORAL_TABLET | ORAL | 0 refills | Status: DC

## 2024-06-24 MED ORDER — GUAIFENESIN-DM 100-10 MG/5ML PO SYRP: 10.0000 mL | ORAL_SOLUTION | Freq: Three times a day (TID) | ORAL | 0 refills | Status: DC

## 2024-06-24 MED ORDER — METOPROLOL SUCCINATE ER 100 MG PO TB24: 100.0000 mg | ORAL_TABLET | Freq: Every day | ORAL | 1 refills | Status: DC

## 2024-06-24 MED ORDER — DILTIAZEM HCL ER COATED BEADS 120 MG PO CP24: 120.0000 mg | ORAL_CAPSULE | Freq: Every day | ORAL | 1 refills | Status: DC

## 2024-06-24 MED ORDER — LEVALBUTEROL HCL 0.63 MG/3ML IN NEBU
0.6300 mg | INHALATION_SOLUTION | Freq: Four times a day (QID) | RESPIRATORY_TRACT | Status: DC | PRN
  Administered 2024-06-24 – 2024-06-26 (×3): 0.63 mg via RESPIRATORY_TRACT
  Filled 2024-06-24 (×3): qty 3

## 2024-06-24 MED ORDER — DILTIAZEM HCL ER COATED BEADS 120 MG PO CP24
120.0000 mg | ORAL_CAPSULE | Freq: Every day | ORAL | Status: DC
  Administered 2024-06-24 – 2024-06-26 (×3): 120 mg via ORAL
  Filled 2024-06-24 (×3): qty 1

## 2024-06-24 MED ORDER — LEVOFLOXACIN 750 MG PO TABS: 750.0000 mg | ORAL_TABLET | Freq: Every day | ORAL | 0 refills | Status: DC

## 2024-06-24 MED ORDER — IPRATROPIUM BROMIDE 0.02 % IN SOLN
0.5000 mg | Freq: Two times a day (BID) | RESPIRATORY_TRACT | Status: DC
  Administered 2024-06-24 – 2024-06-26 (×4): 0.5 mg via RESPIRATORY_TRACT
  Filled 2024-06-24 (×4): qty 2.5

## 2024-06-24 MED ORDER — BENZONATATE 200 MG PO CAPS: 200.0000 mg | ORAL_CAPSULE | Freq: Three times a day (TID) | ORAL | 0 refills | Status: DC

## 2024-06-24 MED ORDER — LEVALBUTEROL HCL 1.25 MG/0.5ML IN NEBU
1.2500 mg | INHALATION_SOLUTION | Freq: Two times a day (BID) | RESPIRATORY_TRACT | Status: DC
  Administered 2024-06-24 – 2024-06-26 (×4): 1.25 mg via RESPIRATORY_TRACT
  Filled 2024-06-24 (×4): qty 0.5

## 2024-06-24 NOTE — Plan of Care (Signed)
   Problem: Activity: Goal: Risk for activity intolerance will decrease Outcome: Progressing   Problem: Coping: Goal: Level of anxiety will decrease Outcome: Progressing

## 2024-06-24 NOTE — Inpatient Diabetes Management (Signed)
 Inpatient Diabetes Program Recommendations  AACE/[name]: New Consensus Statement on Inpatient Glycemic Control (2015)  Target Ranges:  Prepandial:   less than 140 mg/dL      Peak postprandial:   less than 180 mg/dL (1-2 hours)      Critically ill patients:  140 - 180 mg/dL   Lab Results  Component Value Date   GLUCAP 216 (H) 06/24/2024   HGBA1C 5.4 05/25/2024    Review of Glycemic Control  Latest Reference Range & Units 06/23/24 07:17 06/23/24 11:06 06/23/24 17:19 06/23/24 20:49 06/24/24 07:00 06/24/24 11:18  Glucose-Capillary 70 - 99 mg/dL 832 (H) 855 (H) 850 (H) 246 (H) 143 (H) 216 (H)   Diabetes history: DM 2 Outpatient Diabetes medications: None Current orders for Inpatient glycemic control:  Novolog  0-20 units tid  Inpatient Diabetes Program Recommendations:    Solumedrol 40 mg Q12 hours  -   Could order Novolog  2 units tid meal coverage if eating >50% of meals  Thanks,  Clotilda Bull RN, MSN, BC-ADM Inpatient Diabetes Coordinator Team Pager [phone] (8a-5p)

## 2024-06-24 NOTE — Progress Notes (Signed)
 PHARMACY - ANTICOAGULATION CONSULT NOTE  Pharmacy Consult for warfarin Indication: atrial fibrillation  No Known Allergies  Patient Measurements: Height: 5' 10 (177.8 cm) Weight: 95.3 kg (210 lb 1.6 oz) IBW/kg (Calculated) : 73 HEPARIN  DW (KG): 93.3  Vital Signs: Temp: 98.4 F (36.9 C) (09/25 0354) Temp Source: Oral (09/25 0354) BP: 118/72 (09/25 0354) Pulse Rate: 79 (09/25 0354)  Labs: Recent Labs    06/21/24 1139 06/21/24 1141 06/22/24 0401 06/23/24 0402 06/24/24 0350  HGB  --    < > 10.3* 10.0* 10.5*  HCT  --    < > 33.1* 32.7* 34.5*  PLT  --    < > 187 197 233  LABPROT 26.8*  --  32.0* 40.2* 39.2*  INR 2.3*  --  2.9* 3.9* 3.8*  CREATININE  --    < > 1.04 1.10 1.13  TROPONINIHS 16  --   --   --   --    < > = values in this interval not displayed.    Estimated Creatinine Clearance: 66.4 mL/min (by C-G formula based on SCr of 1.13 mg/dL).   Medical History: Past Medical History:  Diagnosis Date   A-fib (HCC) 2017   after last lung surgery patient had a history if Afib documented in hospital    AAA (abdominal aortic aneurysm)    4.0 cm 09/17/22 US    Acute appendicitis 07/14/2021   Arthritis    back & legs ( knees)   Cancer (HCC)    lung   Complication of anesthesia    agitated upon waking from anesth.    Dyspnea    Dysrhythmia    A. Fib   Emphysema    Emphysema of lung (HCC)    History of kidney stones    Hyperlipidemia    Hypertension    Oxygen  dependent    2L continuous   Peripheral vascular disease    Aortic Aneurysm   Pneumonia    Pre-diabetes    Sleep apnea    No OSA, but wears 2L [place] O2 continuous including at night   Stroke [facility]) 2022   Right sided weakness, decreased hearing, balance and memory loss   Tobacco abuse     Medications:  See med rec  Assessment: 73 yo male with history of a-fib on coumadin , and DOPD, AAA, Lung CA, HTN, HLD, PVD. Paitent with complaints of shortness of breath, cough, and palpitation. Patient reports  his symptoms has been progressively worsening in the past few days. Pharmacy asked to dose warfarin   INR= 2.3> 2.9> 3.9>3.8, supratherapeutic . Likely somewhat d/t antibiotics . No warfarin again today.  Home dose per anticoag clinic: 5mg  on MWF, and 2.5mg  all other days.  Goal of Therapy:  INR 2-3 Monitor platelets by anticoagulation protocol: Yes   Plan:  No Warfarin today Daily PT-INR Monitor for S/s of bleeding  Dempsey Blush PharmD., BCPS Clinical Pharmacist 06/24/2024 8:14 AM

## 2024-06-24 NOTE — Progress Notes (Signed)
 PROGRESS NOTE    Patient: Randall Sullivan                            PCP: Davonna Siad, MD                    DOB: [birth year]            DOA: 06/21/2024 FMW:[id number]             DOS: 06/24/2024, 12:34 PM   LOS: 3 days   Date of Service: The patient was seen and examined on 06/24/2024  Subjective:   The patient was seen and examined this morning, complaining of shortness of breath, with minimal wheezing and rhonchi, using some accessory muscles At baseline now on 3 L of oxygen , satting 93%  Given his tachycardia improved, has any chest pain  Brief Narrative:   Randall Sullivan is a 73 year old with history of A-fib, on Coumadin , COPD,  AAA, lung cancer, HTN, HLD, PVD, prediabetic .Randall Sullivan Presenting to ED with chief complaint of shortness of breath, cough, and palpitation. Patient reports his symptoms has been progressively worsening in the past few days.  Denies any chest pain, denies any fever or chills, nausea or vomiting.  No excessive swelling in extremities  ED evaluation: Blood pressure 119/71, pulse 87, temperature 97.9 F (36.6 C), resp. rate (!) 21, height 5' 10 (1.778 m), weight 98 kg, SpO2 96%.  LABs: WBC 10.3, hemoglobin 11.8, sodium 134, chloride 96, creatinine 1.13, INR 2.3, troponin 16, glucose 126, BNP 226 -Respiratory panel All negative - Chest x-ray;  Patchy right upper and right basilar airspace opacification, indicative of pneumonia. 2. Small loculated right pleural fluid. 3. Right upper lobe wedge resection.  Left upper lobectom  On arrival patient was found in A-fib with RVR, with a heart rate of 154 started on Cardizem  drip-currently 7  Requested patient to be admitted for rate control, acute on chronic respiratory failure, COPD exacerbation, treat for possible pneumonia    Assessment & Plan:   Principal Problem:   Atrial fibrillation with RVR (HCC) Active Problems:   Recurrent non-small cell lung cancer (NSCLC) (HCC)   Essential (primary) hypertension    Hyperlipidemia   Benign prostatic hyperplasia with nocturia   S/P lobectomy of lung   Type 2 diabetes mellitus with other specified complication (HCC)   Infrarenal abdominal aortic aneurysm (AAA) without rupture   Insomnia   History of CVA with residual deficit   COPD exacerbation (HCC)   Allergic dermatitis   Peripheral neuropathy   CAP (community acquired pneumonia)   Atrial fibrillation (HCC)     Assessment and Plan: * Atrial fibrillation with RVR (HCC) - Proceed of tachyarrhythmia, A-fib with RVR, improved Remain in atrial flutter -Cardiology recommendation, continue Toprol  XL 100 mg daily, Cardizem  switched to long-acting Cardizem  on 120  mg daily  POA; in A-fib with RVR heart rate as high as 154 >> 87 -History of A-fib-exacerbated by underlying lung disease, COPD exacerbation, hypoxia - -POA was started on Cardizem  drip was tapered off-  Cardiology was consulted, restarted on p.o. Cardizem  30 mg  every 6 hours - Continue Toprol -XL at 100 mg p.o. daily  - Continue Coumadin  INR therapeutic 2.9  Elevated BNP -IV Lasix  x 2  -Discussion with cardiology regarding possible cardioversion  Acute on chronic respiratory failure secondary to COPD exacerbation (HCC)/PNA - Acute on chronic respiratory failure, with history of COPD, currently COPD exacerbation -Still complaining  of shortness of breath, with minimal exertion, minimal diffuse wheezing and rhonchi, -Back to baseline on 3 L of oxygen , satting 96% -Continue supplemental oxygen  with goal O2 sat of 88-92% -Tapering Down O2 supplements, and IV steroids -IV Solu-Medrol -tapering down -Continue with DuoNeb bronchodilators, mucolytics -On encouraging spirometer and flutter valve  Recurrent non-small cell lung cancer (NSCLC) (HCC) -With history of lobe resection -Has been on treatment with chemotherapy - Patient is to follow-up with a primary oncologist   Benign prostatic hyperplasia with nocturia - Review home  medication, continue Flomax , finasteride   Hyperlipidemia - Statins, fibrate, fish oil  Essential (primary) hypertension - Blood pressure stable review home medication -Will resume accordingly, currently blood pressure running soft on Cardizem  drip  CAP (community acquired pneumonia) with history of lung CA and COPD - Possibly community-acquired pneumonia, chronic on chest x-ray - Chest x-ray patchy right upper and right basilar airspace opacification,indicative of pneumonia.2. Small loculated right pleural fluid.3. Right upper lobe wedge resection.  Left upper lobectom -Remains stable, afebrile, normotensive -Lactic acid 1.0, procalcitonin 0.10 -Patient received IV azithromycin /Rocephin >> will switch to p.o. Levaquin  Will continue antibiotics -Follow-up with cultures, will try to obtain sputum culture -Continue nebs, steroids  Peripheral neuropathy - Currently not on any medication, monitoring closely  History of CVA with residual deficit - Continue Coumadin , statins,  Insomnia - As needed trazodone   Infrarenal abdominal aortic aneurysm (AAA) without rupture - Monitoring closely -Keeping blood pressure check -Follow-up as an outpatient  Type 2 diabetes mellitus with other specified complication (HCC) - Prediabetic with hemoglobin of 5.4 -Since patient cannot be on steroids, anticipating hyperglycemia -Will check CBG Q ACHS, SSI coverage   S/P lobectomy of lung - Monitor closely, in respite distress due to pneumonia, COPD exacerbation   Nutritional status:  The patient's BMI is: Body mass index is 30.15 kg/m. -Diabetic, carb modified, cardiac diet Recommending weight loss   ----------------------------------------------------------------------------------------------------------  Cultures; Blood Cultures x 2 >> no growth to date    DVT prophylaxis:  SCDs Start: 06/21/24 1308   Code Status:   Code Status: Full Code  Family Communication: No family member  present at bedside-  -Advance care planning has been discussed.   Admission status:   Status is: Inpatient Remains inpatient appropriate because: Requiring IV antibiotics, breathing treatments   Disposition: From  - home             Planning for discharge in 1-2 days   Procedures:   No admission procedures for hospital encounter.   Antimicrobials:  Anti-infectives (From admission, onward)    Start     Dose/Rate Route Frequency Ordered Stop   06/25/24 0000  levofloxacin  (LEVAQUIN ) 750 MG tablet        750 mg Oral Daily 06/24/24 0721 06/27/24 2359   06/22/24 1300  levofloxacin  (LEVAQUIN ) tablet 750 mg        750 mg Oral Daily 06/21/24 1311     06/21/24 1315  cefTRIAXone  (ROCEPHIN ) 1 g in sodium chloride  0.9 % 100 mL IVPB        1 g 200 mL/hr over 30 Minutes Intravenous  Once 06/21/24 1308 06/21/24 1356   06/21/24 1315  azithromycin  (ZITHROMAX ) 500 mg in sodium chloride  0.9 % 250 mL IVPB        500 mg 250 mL/hr over 60 Minutes Intravenous  Once 06/21/24 1308 06/21/24 1425        Medication:   benzonatate   200 mg Oral TID   budesonide  (PULMICORT ) nebulizer solution  0.25 mg Nebulization  BID   Chlorhexidine  Gluconate Cloth  6 each Topical Daily   diltiazem   120 mg Oral Daily   fenofibrate   160 mg Oral Daily   finasteride   5 mg Oral Daily   guaiFENesin -dextromethorphan   10 mL Oral Q8H   insulin  aspart  0-20 Units Subcutaneous TID WC   ipratropium  0.5 mg Nebulization BID   levalbuterol   1.25 mg Nebulization BID   levofloxacin   750 mg Oral Daily   loratadine   10 mg Oral Daily   magnesium  oxide  400 mg Oral BID   methylPREDNISolone  sodium succinate  40 mg Intravenous Q12H   metoprolol  succinate  100 mg Oral Daily   rosuvastatin   40 mg Oral Daily   sodium chloride  flush  10-40 mL Intracatheter Q12H   tamsulosin   0.8 mg Oral Daily   Warfarin - Pharmacist Dosing Inpatient   Does not apply q1600    acetaminophen  **OR** acetaminophen , ALPRAZolam , bisacodyl , hydrALAZINE ,  HYDROmorphone  (DILAUDID ) injection, hydrOXYzine , levalbuterol , metoprolol  tartrate, ondansetron  **OR** ondansetron  (ZOFRAN ) IV, mouth rinse, oxyCODONE , senna-docusate, sodium chloride  flush, traZODone    Objective:   Vitals:   06/24/24 0354 06/24/24 0447 06/24/24 0700 06/24/24 1229  BP: 118/72     Pulse: 79     Resp: 18     Temp: 98.4 F (36.9 C)     TempSrc: Oral     SpO2: 98%  98% 93%  Weight:  95.3 kg    Height:        Intake/Output Summary (Last 24 hours) at 06/24/2024 1234 Last data filed at 06/24/2024 0100 Gross per 24 hour  Intake 480 ml  Output 250 ml  Net 230 ml   Filed Weights   06/22/24 0500 06/23/24 0500 06/24/24 0447  Weight: 96.4 kg 95.6 kg 95.3 kg     Physical examination:   General:  AAO x 3, in distress with shortness of breath, using some accessory muscles  HEENT:  Normocephalic, PERRL, otherwise with in Normal limits   Neuro:  CNII-XII intact. , normal motor and sensation, reflexes intact   Lungs:   Shortness of breath, using some accessory muscles Diffuse minimal wheezing, rhonchi, negative any crackles  Cardio:    S1/S2, RRR, No murmure, No Rubs or Gallops   Abdomen:  Soft, non-tender, bowel sounds active all four quadrants, no guarding or peritoneal signs.  Muscular  skeletal:  Limited exam -global generalized weaknesses - in bed, able to move all 4 extremities,   2+ pulses,  symmetric, No pitting edema  Skin:  Dry, warm to touch, negative for any Rashes,  Wounds: Please see nursing documentation    ----------------------------------------------------------------------------------------------------------------    LABs:     Latest Ref Rng & Units 06/24/2024    3:50 AM 06/23/2024    4:02 AM 06/22/2024    4:01 AM  CBC  WBC 4.0 - 10.5 K/uL 12.1  12.2  8.6   Hemoglobin 13.0 - 17.0 g/dL 89.4  89.9  89.6   Hematocrit 39.0 - 52.0 % 34.5  32.7  33.1   Platelets 150 - 400 K/uL 233  197  187       Latest Ref Rng & Units 06/24/2024    3:50 AM  06/23/2024    4:02 AM 06/22/2024    4:01 AM  CMP  Glucose 70 - 99 mg/dL 817  822  831   BUN 8 - 23 mg/dL 42  37  26   Creatinine 0.61 - 1.24 mg/dL 8.86  8.89  8.95   Sodium 135 - 145  mmol/L 140  142  138   Potassium 3.5 - 5.1 mmol/L 4.1  4.4  4.5   Chloride 98 - 111 mmol/L 97  99  99   CO2 22 - 32 mmol/L 32  31  26   Calcium  8.9 - 10.3 mg/dL 8.8  8.9  8.8        Micro Results Recent Results (from the past 240 hours)  Resp panel by RT-PCR (RSV, Flu A&B, Covid) Anterior Nasal Swab     Status: None   Collection Time: 06/21/24 11:57 AM   Specimen: Anterior Nasal Swab  Result Value Ref Range Status   SARS Coronavirus 2 by RT PCR NEGATIVE NEGATIVE Final    Comment: (NOTE) SARS-CoV-2 target nucleic acids are NOT DETECTED.  The SARS-CoV-2 RNA is generally detectable in upper respiratory specimens during the acute phase of infection. The lowest concentration of SARS-CoV-2 viral copies this assay can detect is 138 copies/mL. A negative result does not preclude SARS-Cov-2 infection and should not be used as the sole basis for treatment or other patient management decisions. A negative result may occur with  improper specimen collection/handling, submission of specimen other than nasopharyngeal swab, presence of viral mutation(s) within the areas targeted by this assay, and inadequate number of viral copies(<138 copies/mL). A negative result must be combined with clinical observations, patient history, and epidemiological information. The expected result is Negative.  Fact Sheet for Patients:  BloggerCourse.com  Fact Sheet for Healthcare Providers:  SeriousBroker.it  This test is no t yet approved or cleared by the [place]  FDA and  has been authorized for detection and/or diagnosis of SARS-CoV-2 by FDA under an Emergency Use Authorization (EUA). This EUA will remain  in effect (meaning this test can be used) for the duration of  the COVID-19 declaration under Section 564(b)(1) of the Act, 21 U.S.C.section 360bbb-3(b)(1), unless the authorization is terminated  or revoked sooner.       Influenza A by PCR NEGATIVE NEGATIVE Final   Influenza B by PCR NEGATIVE NEGATIVE Final    Comment: (NOTE) The Xpert Xpress SARS-CoV-2/FLU/RSV plus assay is intended as an aid in the diagnosis of influenza from Nasopharyngeal swab specimens and should not be used as a sole basis for treatment. Nasal washings and aspirates are unacceptable for Xpert Xpress SARS-CoV-2/FLU/RSV testing.  Fact Sheet for Patients: BloggerCourse.com  Fact Sheet for Healthcare Providers: SeriousBroker.it  This test is not yet approved or cleared by the [place]  FDA and has been authorized for detection and/or diagnosis of SARS-CoV-2 by FDA under an Emergency Use Authorization (EUA). This EUA will remain in effect (meaning this test can be used) for the duration of the COVID-19 declaration under Section 564(b)(1) of the Act, 21 U.S.C. section 360bbb-3(b)(1), unless the authorization is terminated or revoked.     Resp Syncytial Virus by PCR NEGATIVE NEGATIVE Final    Comment: (NOTE) Fact Sheet for Patients: BloggerCourse.com  Fact Sheet for Healthcare Providers: SeriousBroker.it  This test is not yet approved or cleared by the [place]  FDA and has been authorized for detection and/or diagnosis of SARS-CoV-2 by FDA under an Emergency Use Authorization (EUA). This EUA will remain in effect (meaning this test can be used) for the duration of the COVID-19 declaration under Section 564(b)(1) of the Act, 21 U.S.C. section 360bbb-3(b)(1), unless the authorization is terminated or revoked.  Performed at [facility], [address], [place], [place] [address]     Radiology Reports No results found.   SIGNED: Adriana LABOR  Paloma Grange, MD, FHM.  FAAFP. [place] [facility] Time spent - 55 min.  In seeing, evaluating and examining the patient. Reviewing medical records, labs, drawn plan of care. [facility],  Pager (please use [email or web address] to page/ text) Please use Epic Secure Chat for non-urgent communication (7AM-7PM)  If 7PM-7AM, please contact night-coverage [email or web address], 06/24/2024, 12:34 PM

## 2024-06-24 NOTE — Progress Notes (Signed)
 Rounding Note   Patient Name: Randall Sullivan Date of Encounter: 06/24/2024  [facility] Cardiologist: Randall Schilling, MD   Subjective SOB improving  Scheduled Meds:  benzonatate   200 mg Oral TID   budesonide  (PULMICORT ) nebulizer solution  0.25 mg Nebulization BID   Chlorhexidine  Gluconate Cloth  6 each Topical Daily   diltiazem   120 mg Oral Daily   fenofibrate   160 mg Oral Daily   finasteride   5 mg Oral Daily   guaiFENesin -dextromethorphan   10 mL Oral Q8H   insulin  aspart  0-20 Units Subcutaneous TID WC   ipratropium  0.5 mg Nebulization BID   levalbuterol   1.25 mg Nebulization BID   levofloxacin   750 mg Oral Daily   loratadine   10 mg Oral Daily   magnesium  oxide  400 mg Oral BID   methylPREDNISolone  sodium succinate  40 mg Intravenous Q12H   metoprolol  succinate  100 mg Oral Daily   rosuvastatin   40 mg Oral Daily   sodium chloride  flush  10-40 mL Intracatheter Q12H   tamsulosin   0.8 mg Oral Daily   Warfarin - Pharmacist Dosing Inpatient   Does not apply q1600   Continuous Infusions:  PRN Meds: acetaminophen  **OR** acetaminophen , ALPRAZolam , bisacodyl , hydrALAZINE , HYDROmorphone  (DILAUDID ) injection, hydrOXYzine , levalbuterol , metoprolol  tartrate, ondansetron  **OR** ondansetron  (ZOFRAN ) IV, mouth rinse, oxyCODONE , senna-docusate, sodium chloride  flush, traZODone    Vital Signs  Vitals:   06/24/24 0110 06/24/24 0354 06/24/24 0447 06/24/24 0700  BP:  118/72    Pulse:  79    Resp:  18    Temp:  98.4 F (36.9 C)    TempSrc:  Oral    SpO2: 96% 98%  98%  Weight:   95.3 kg   Height:        Intake/Output Summary (Last 24 hours) at 06/24/2024 0906 Last data filed at 06/24/2024 0100 Gross per 24 hour  Intake 480 ml  Output 250 ml  Net 230 ml      06/24/2024    4:47 AM 06/23/2024    5:00 AM 06/22/2024    5:00 AM  Last 3 Weights  Weight (lbs) 210 lb 1.6 oz 210 lb 12.2 oz 212 lb 8.4 oz  Weight (kg) 95.3 kg 95.6 kg 96.4 kg      Telemetry Rate controlled  aflutter - Personally Reviewed  ECG  N/a - Personally Reviewed  Physical Exam  GEN: No acute distress.   Neck: No JVD Cardiac: RRR, no murmurs, rubs, or gallops.  Respiratory: Clear to auscultation bilaterally. GI: Soft, nontender, non-distended  MS: No edema; No deformity. Neuro:  Nonfocal  Psych: Normal affect   Labs High Sensitivity Troponin:   Recent Labs  Lab 06/21/24 1139  TROPONINIHS 16     Chemistry Recent Labs  Lab 06/21/24 1139 06/21/24 1141 06/22/24 0401 06/23/24 0402 06/24/24 0350  NA  --    < > 138 142 140  K  --    < > 4.5 4.4 4.1  CL  --    < > 99 99 97*  CO2  --    < > 26 31 32  GLUCOSE  --    < > 168* 177* 182*  BUN  --    < > 26* 37* 42*  CREATININE  --    < > 1.04 1.10 1.13  CALCIUM   --    < > 8.8* 8.9 8.8*  MG 1.7  --   --  2.1  --   GFRNONAA  --    < > >  60 >60 >60  ANIONGAP  --    < > 13 12 11    < > = values in this interval not displayed.    Lipids  Recent Labs  Lab 06/22/24 0401  CHOL 95  TRIG 74  HDL 37*  LDLCALC 43  CHOLHDL 2.6    Hematology Recent Labs  Lab 06/22/24 0401 06/23/24 0402 06/24/24 0350  WBC 8.6 12.2* 12.1*  RBC 4.03* 3.96* 4.17*  HGB 10.3* 10.0* 10.5*  HCT 33.1* 32.7* 34.5*  MCV 82.1 82.6 82.7  MCH 25.6* 25.3* 25.2*  MCHC 31.1 30.6 30.4  RDW 15.4 15.2 15.3  PLT 187 197 233   Thyroid  No results for input(s): TSH, FREET4 in the last 168 hours.  BNP Recent Labs  Lab 06/22/24 0401 06/23/24 0402 06/24/24 0350  BNP 211.0* 256.0* 379.0*    DDimer No results for input(s): DDIMER in the last 168 hours.   Radiology  No results found.  Cardiac Studies   Patient Profile   Randall Sullivan is a 73 y.o. male currently admitted to the hospital with community-acquired pneumonia and COPD exacerbation.  He has a history of recurrent non-small cell lung cancer, initially underwent left VATS in 2017 with lobectomy and lymph node resection, follow-up right VATS with right upper lobe nodule and bleb resection in  2020, status post chemoradiation with carboplatin  and paclitaxel , currently on Durvalumab  with follow-up by oncology.  Cardiology consulted to assist with management of atrial fibrillation/flutter.    Assessment & Plan   1.PAF/aflutter - prior history of PAF - issues with new onset aflutter with RVR this admission in setting of pneumonia, COPD exacerbation - last several EKGs 2022 to 2024 showed NSR. From notes new onset afib 08/02/2016 after surgery, had VATs and LUL lobectomy. Transiently on anticoag, thought to be isolated postop afib and anticoag eventually stopped. 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 Its unclear the frequency of noted recurrent afib over the years. Appears to be first flutter episode - initially on IV dilt gtt - currently on dilt 120mg  daily, toprol  100mg  daily. Remains in aflutter with controlled rates.  - on coumadin  for stroke prevention  - from EKG review appears primarily to be in NSR at baseline.  - may be worth trying to establish SR. Flutter may have been primarily exacerbated by his pneumonia. Follow rates today and with ambulation, make npo tonight in case DCCV is indicated tomorrow. If rates uncontrolled and back to baseline respiratory status likely would plan for DCCV if patient agrees, he states today he is 50/50 if he would agree.  - INRs 2.4, 2.9, 2.3, 2.9, 3.9, 3.8 over the last month.  - update echo   2.COPD/CAP - per primary team  3. History of lung cancer - followed by oncology     For questions or updates, please contact [facility] Please consult [email or web address] for contact info under       Signed, Randall Carrier, MD  06/24/2024, 9:06 AM

## 2024-06-24 NOTE — Progress Notes (Signed)
*  PRELIMINARY RESULTS* Echocardiogram 2D Echocardiogram has been performed.  Randall Sullivan 06/24/2024, 1:10 PM

## 2024-06-25 ENCOUNTER — Encounter (HOSPITAL_COMMUNITY): Payer: Self-pay | Admitting: Cardiology

## 2024-06-25 ENCOUNTER — Inpatient Hospital Stay (HOSPITAL_COMMUNITY): Admitting: Certified Registered"

## 2024-06-25 ENCOUNTER — Encounter (HOSPITAL_COMMUNITY)
Admission: EM | Disposition: A | Discharge status: Home Health | Payer: Self-pay | Source: Home / Self Care | Attending: Family Medicine

## 2024-06-25 DIAGNOSIS — I2510 Atherosclerotic heart disease of native coronary artery without angina pectoris: Secondary | ICD-10-CM

## 2024-06-25 DIAGNOSIS — J441 Chronic obstructive pulmonary disease with (acute) exacerbation: Secondary | ICD-10-CM

## 2024-06-25 DIAGNOSIS — I4892 Unspecified atrial flutter: Secondary | ICD-10-CM | POA: Diagnosis not present

## 2024-06-25 DIAGNOSIS — I10 Essential (primary) hypertension: Secondary | ICD-10-CM

## 2024-06-25 DIAGNOSIS — E1169 Type 2 diabetes mellitus with other specified complication: Secondary | ICD-10-CM

## 2024-06-25 DIAGNOSIS — Z87891 Personal history of nicotine dependence: Secondary | ICD-10-CM | POA: Diagnosis not present

## 2024-06-25 DIAGNOSIS — I4891 Unspecified atrial fibrillation: Secondary | ICD-10-CM | POA: Diagnosis not present

## 2024-06-25 HISTORY — PX: CARDIOVERSION: SHX1299

## 2024-06-25 LAB — BASIC METABOLIC PANEL WITH GFR
Anion gap: 13 (ref 5–15)
BUN: 42 mg/dL — ABNORMAL HIGH (ref 8–23)
CO2: 32 mmol/L (ref 22–32)
Calcium: 9 mg/dL (ref 8.9–10.3)
Chloride: 99 mmol/L (ref 98–111)
Creatinine, Ser: 1.09 mg/dL (ref 0.61–1.24)
GFR, Estimated: >60 mL/min (ref >60)
Glucose, Bld: 164 mg/dL — ABNORMAL HIGH (ref 70–99)
Potassium: 4.5 mmol/L (ref 3.5–5.1)
Sodium: 144 mmol/L (ref 135–145)

## 2024-06-25 LAB — CBC
HCT: 38.1 % — ABNORMAL LOW (ref 39.0–52.0)
Hemoglobin: 11.4 g/dL — ABNORMAL LOW (ref 13.0–17.0)
MCH: 24.9 pg — ABNORMAL LOW (ref 26.0–34.0)
MCHC: 29.9 g/dL — ABNORMAL LOW (ref 30.0–36.0)
MCV: 83.4 fL (ref 80.0–100.0)
Platelets: 257 K/uL (ref 150–400)
RBC: 4.57 MIL/uL (ref 4.22–5.81)
RDW: 15 % (ref 11.5–15.5)
WBC: 12.8 K/uL — ABNORMAL HIGH (ref 4.0–10.5)
nRBC: 0 % (ref 0.0–0.2)

## 2024-06-25 LAB — PROTIME-INR
INR: 3.6 — ABNORMAL HIGH (ref 0.8–1.2)
Prothrombin Time: 37.5 s — ABNORMAL HIGH (ref 11.4–15.2)

## 2024-06-25 LAB — GLUCOSE, CAPILLARY
Glucose-Capillary: 137 mg/dL — ABNORMAL HIGH (ref 70–99)
Glucose-Capillary: 128 mg/dL — ABNORMAL HIGH (ref 70–99)
Glucose-Capillary: 137 mg/dL — ABNORMAL HIGH (ref 70–99)
Glucose-Capillary: 221 mg/dL — ABNORMAL HIGH (ref 70–99)
Glucose-Capillary: 209 mg/dL — ABNORMAL HIGH (ref 70–99)

## 2024-06-25 SURGERY — CARDIOVERSION
Anesthesia: General

## 2024-06-25 MED ORDER — LACTATED RINGERS IV SOLN: INTRAVENOUS | Status: DC | PRN

## 2024-06-25 MED ORDER — PROPOFOL 10 MG/ML IV BOLUS
INTRAVENOUS | Status: DC | PRN
  Administered 2024-06-25: 50 mg via INTRAVENOUS

## 2024-06-25 NOTE — CV Procedure (Addendum)
 CV Procedure Note  Procedure: Electrical cardioversion Indication: atrial flutter Physician: Dr Dorn Ross   Patient was brought to the procedure suite after appropriate consent was obtained. I confirmed from chart review INRs had been therapeutic for at least the last 3 weeks. Defib pads placed in the anterior and posterior positions. Sedation achieved with the assistance of anesthesiology, for details please refer to their documentation. Succesfully converted from atrial flutter to normal sinus rhythm with a single synchonized 200J shock. Cardiopulmonary monitoring was performed throughout the procedure, he tolerated well without complications.     Dorn Ross MD

## 2024-06-25 NOTE — Progress Notes (Signed)
 Done in room after transferred.

## 2024-06-25 NOTE — Anesthesia Preprocedure Evaluation (Signed)
 Anesthesia Evaluation  Patient identified by MRN, date of birth, ID band Patient awake    Reviewed: Allergy & Precautions, H&P , NPO status , Patient's Chart, lab work & pertinent test results, reviewed documented beta blocker date and time   History of Anesthesia Complications (+) history of anesthetic complications  Airway Mallampati: II  TM Distance: >3 FB Neck ROM: full    Dental no notable dental hx.    Pulmonary shortness of breath, sleep apnea , pneumonia, COPD, former smoker   Pulmonary exam normal breath sounds clear to auscultation       Cardiovascular Exercise Tolerance: Good hypertension, + CAD and + Peripheral Vascular Disease  + dysrhythmias Atrial Fibrillation  Rhythm:regular Rate:Normal     Neuro/Psych  Neuromuscular disease CVA  negative psych ROS   GI/Hepatic negative GI ROS, Neg liver ROS,,,  Endo/Other  diabetes    Renal/GU negative Renal ROS  negative genitourinary   Musculoskeletal   Abdominal   Peds  Hematology negative hematology ROS (+)   Anesthesia Other Findings   Reproductive/Obstetrics negative OB ROS                              Anesthesia Physical Anesthesia Plan  ASA: 4 and emergent  Anesthesia Plan: General   Post-op Pain Management:    Induction:   PONV Risk Score and Plan: Propofol  infusion  Airway Management Planned:   Additional Equipment:   Intra-op Plan:   Post-operative Plan:   Informed Consent: I have reviewed the patients History and Physical, chart, labs and discussed the procedure including the risks, benefits and alternatives for the proposed anesthesia with the patient or authorized representative who has indicated his/her understanding and acceptance.     Dental Advisory Given  Plan Discussed with: CRNA  Anesthesia Plan Comments:         Anesthesia Quick Evaluation

## 2024-06-25 NOTE — Progress Notes (Signed)
 Electrical Cardioversion Procedure Note Randall Sullivan [id number] 24-Mar-1951  Procedure: Electrical Cardioversion Indications:  Atrial Fibrillation and Atrial Flutter  Procedure Details Consent: Risks of procedure as well as the alternatives and risks of each were explained to the (patient/caregiver).  Consent for procedure obtained. Time Out: Verified patient identification, verified procedure, site/side was marked, verified correct patient position, special equipment/implants available, medications/allergies/relevent history reviewed, required imaging and test results available.  {time out performed:1232 PM Patient placed on cardiac monitor, pulse oximetry, supplemental oxygen  as necessary.  Sedation given: propofol  Pacer pads placed anterior and posterior chest.  Cardioverted 1 time(s).  Cardioverted at 200J.  Evaluation Findings: Post procedure EKG shows: NSR Complications: None Patient did tolerate procedure well.   Randall Sullivan Page 06/25/2024, 12:31 PM

## 2024-06-25 NOTE — Progress Notes (Signed)
 PROGRESS NOTE   Randall Sullivan  FMW:[id number] DOB: 1950-12-14 DOA: 06/21/2024 PCP: Davonna Siad, MD   Chief Complaint  Patient presents with   Shortness of Breath   Level of care: Telemetry  Brief Admission History:  Randall Sullivan is a 73 year old with history of A-fib, on Coumadin , COPD, AAA, lung cancer, HTN, HLD, PVD, prediabetic. He presenting to ED with chief complaint of shortness of breath, cough, and palpitation. Patient reports his symptoms has been progressively worsening in the past few days.  Denies any chest pain, denies any fever or chills, nausea or vomiting.  No excessive swelling in extremities.    On arrival patient was found in A-fib with RVR, with a heart rate of 154 started on Cardizem  drip.    Requested patient to be admitted for rate control, acute on chronic respiratory failure, COPD exacerbation, treat for possible pneumonia   Assessment and Plan:  Atrial fibrillation with RVR  POA; in A-fib with RVR heart rate as high as 154 >> 87 With history of A-fib -Likely exacerbated by shortness of breath, COPD exacerbation -Resume home medication: Including metoprolol  - Continue Coumadin  INR therapeutic 2.3 - pt to have DCCV with Dr. Alvan today  - further recommendations to follow   CAP (community acquired pneumonia) - Possibly community-acquired pneumonia, chronic on chest x-ray - Chest x-ray patchy right upper and right basilar airspace opacification,indicative of pneumonia.2. Small loculated right pleural fluid.3. Right upper lobe wedge resection.  Left upper lobectom -No leukocytosis, afebrile -Obtaining procalcitonin level and lactic acid -Patient received IV azithromycin /Rocephin >> p.o. Levaquin  -Follow-up with cultures, will try to obtain sputum culture -Continue nebs, steroids  Peripheral neuropathy - Currently not on any medication, monitoring closely  Recurrent non-small cell lung cancer (NSCLC) - Patient is to follow-up with a primary  oncologist -Currently not on chemo or radiation therapy  COPD exacerbation  - Acute on chronic respiratory failure, with history of COPD, currently COPD exacerbation -Currently satting 96% on 3 L of oxygen  -Continue supplemental oxygen  with goal O2 sat of 88-92% -Tapering off O2 supplements, IV steroids -Solu-Medrol  40 mg IV BID with plan to DC home on oral prednisone  -Continue with DuoNeb bronchodilators, mucolytics -On encouraging spirometer and flutter valve  History of CVA with residual deficit - Continue Coumadin , statins,  Insomnia - As needed trazodone   Infrarenal abdominal aortic aneurysm (AAA) without rupture - Monitoring closely -Keeping blood pressure check -Follow-up as an outpatient  Type 2 diabetes mellitus with other specified complication  - Prediabetic with hemoglobin of 5.4 -Will check CBG Q ACHS, SSI coverage CBG (last 3)  Recent Labs    06/25/24 0838 06/25/24 1153 06/25/24 1301  GLUCAP 137* 128* 137*   Benign prostatic hyperplasia with nocturia - Review home medication, continue Flomax , finasteride   Hyperlipidemia - Statins, fibrate, fish oil  S/P lobectomy of lung - Monitor closely, in respite distress due to pneumonia, COPD exacerbation  Essential (primary) hypertension - Blood pressure stable review home medication -Will resume accordingly, currently blood pressure running soft on Cardizem  drip  DVT prophylaxis: SCDs Code Status: Full  Family Communication:  Disposition:   Consultants:  Cardiology  Procedures:  DCCV 9/26  Antimicrobials:    Subjective: Pt denies shortness of breath, he had some palpitations briefly  Objective: Vitals:   06/25/24 1315 06/25/24 1330 06/25/24 1358 06/25/24 1545  BP: 117/76 (!) 123/92 (!) 124/97   Pulse: 67 94 89   Resp: 18 18 18    Temp: 97.9 F (36.6 C) 97.6 F (36.4 C) 97.8  F (36.6 C)   TempSrc:   Oral   SpO2: 94% 96% 98% 98%  Weight:      Height:        Intake/Output Summary (Last 24  hours) at 06/25/2024 1601 Last data filed at 06/25/2024 1300 Gross per 24 hour  Intake 460 ml  Output --  Net 460 ml   Filed Weights   06/23/24 0500 06/24/24 0447 06/25/24 0532  Weight: 95.6 kg 95.3 kg 95.2 kg   Examination:  General exam: Appears calm and comfortable  Respiratory system: no increased work of breathing.  Cardiovascular system: normal S1 & S2 heard. No JVD, murmurs, rubs, gallops or clicks. No pedal edema. Gastrointestinal system: Abdomen is nondistended, soft and nontender. No organomegaly or masses felt. Normal bowel sounds heard. Central nervous system: Alert and oriented. No focal neurological deficits. Extremities: Symmetric 5 x 5 power. Skin: No rashes, lesions or ulcers. Psychiatry: Judgement and insight appear normal. Mood & affect appropriate.   Data Reviewed: I have personally reviewed following labs and imaging studies  CBC: Recent Labs  Lab 06/21/24 1141 06/22/24 0401 06/23/24 0402 06/24/24 0350 06/25/24 0350  WBC 10.3 8.6 12.2* 12.1* 12.8*  HGB 11.8* 10.3* 10.0* 10.5* 11.4*  HCT 38.2* 33.1* 32.7* 34.5* 38.1*  MCV 82.0 82.1 82.6 82.7 83.4  PLT 212 187 197 233 257    Basic Metabolic Panel: Recent Labs  Lab 06/21/24 1139 06/21/24 1141 06/22/24 0401 06/23/24 0402 06/24/24 0350 06/25/24 0350  NA  --  134* 138 142 140 144  K  --  4.3 4.5 4.4 4.1 4.5  CL  --  96* 99 99 97* 99  CO2  --  23 26 31  32 32  GLUCOSE  --  120* 168* 177* 182* 164*  BUN  --  21 26* 37* 42* 42*  CREATININE  --  1.13 1.04 1.10 1.13 1.09  CALCIUM   --  9.0 8.8* 8.9 8.8* 9.0  MG 1.7  --   --  2.1  --   --   PHOS 2.4*  --   --   --   --   --     CBG: Recent Labs  Lab 06/24/24 1710 06/24/24 1954 06/25/24 0838 06/25/24 1153 06/25/24 1301  GLUCAP 188* 143* 137* 128* 137*    Recent Results (from the past 240 hours)  Resp panel by RT-PCR (RSV, Flu A&B, Covid) Anterior Nasal Swab     Status: None   Collection Time: 06/21/24 11:57 AM   Specimen: Anterior Nasal  Swab  Result Value Ref Range Status   SARS Coronavirus 2 by RT PCR NEGATIVE NEGATIVE Final    Comment: (NOTE) SARS-CoV-2 target nucleic acids are NOT DETECTED.  The SARS-CoV-2 RNA is generally detectable in upper respiratory specimens during the acute phase of infection. The lowest concentration of SARS-CoV-2 viral copies this assay can detect is 138 copies/mL. A negative result does not preclude SARS-Cov-2 infection and should not be used as the sole basis for treatment or other patient management decisions. A negative result may occur with  improper specimen collection/handling, submission of specimen other than nasopharyngeal swab, presence of viral mutation(s) within the areas targeted by this assay, and inadequate number of viral copies(<138 copies/mL). A negative result must be combined with clinical observations, patient history, and epidemiological information. The expected result is Negative.  Fact Sheet for Patients:  BloggerCourse.com  Fact Sheet for Healthcare Providers:  SeriousBroker.it  This test is no t yet approved or cleared by the [place]   FDA and  has been authorized for detection and/or diagnosis of SARS-CoV-2 by FDA under an Emergency Use Authorization (EUA). This EUA will remain  in effect (meaning this test can be used) for the duration of the COVID-19 declaration under Section 564(b)(1) of the Act, 21 U.S.C.section 360bbb-3(b)(1), unless the authorization is terminated  or revoked sooner.       Influenza A by PCR NEGATIVE NEGATIVE Final   Influenza B by PCR NEGATIVE NEGATIVE Final    Comment: (NOTE) The Xpert Xpress SARS-CoV-2/FLU/RSV plus assay is intended as an aid in the diagnosis of influenza from Nasopharyngeal swab specimens and should not be used as a sole basis for treatment. Nasal washings and aspirates are unacceptable for Xpert Xpress SARS-CoV-2/FLU/RSV testing.  Fact Sheet for  Patients: BloggerCourse.com  Fact Sheet for Healthcare Providers: SeriousBroker.it  This test is not yet approved or cleared by the [place]  FDA and has been authorized for detection and/or diagnosis of SARS-CoV-2 by FDA under an Emergency Use Authorization (EUA). This EUA will remain in effect (meaning this test can be used) for the duration of the COVID-19 declaration under Section 564(b)(1) of the Act, 21 U.S.C. section 360bbb-3(b)(1), unless the authorization is terminated or revoked.     Resp Syncytial Virus by PCR NEGATIVE NEGATIVE Final    Comment: (NOTE) Fact Sheet for Patients: BloggerCourse.com  Fact Sheet for Healthcare Providers: SeriousBroker.it  This test is not yet approved or cleared by the [place]  FDA and has been authorized for detection and/or diagnosis of SARS-CoV-2 by FDA under an Emergency Use Authorization (EUA). This EUA will remain in effect (meaning this test can be used) for the duration of the COVID-19 declaration under Section 564(b)(1) of the Act, 21 U.S.C. section 360bbb-3(b)(1), unless the authorization is terminated or revoked.  Performed at [facility], [address], [place], [place] [address]      Radiology Studies: ECHOCARDIOGRAM COMPLETE Result Date: 06/24/2024    ECHOCARDIOGRAM REPORT   Patient Name:   Randall Sullivan [facility] Date of Exam: 06/24/2024 Medical Rec #:  [id number]    Height:       70.0 in Accession #:    [id number]   Weight:       210.1 lb Date of Birth:  06-04-1951    BSA:          2.131 m Patient Age:    73 years     BP:           118/72 mmHg Patient Gender: M            HR:           79 bpm. Exam Location:  [name] Procedure: 2D Echo, Cardiac Doppler and Color Doppler (Both Spectral and Color            Flow Doppler were utilized during procedure). Indications:    Atrial Flutter I48.92  History:        Patient has prior history  of Echocardiogram examinations, most                 recent 03/13/2021. COPD, Arrythmias:Atrial Fibrillation; Risk                 Factors:Hypertension, Diabetes, Dyslipidemia and Former Smoker.                 Hx of lung cancer.  Sonographer:    Aida Pizza RCS Referring Phys: [id number] DORN FALCON BRANCH IMPRESSIONS  1. Left ventricular ejection fraction, by estimation, is  60 to 65%. The left ventricle has normal function. The left ventricle has no regional wall motion abnormalities. Left ventricular diastolic parameters were normal.  2. Right ventricular systolic function is normal. The right ventricular size is normal.  3. A small pericardial effusion is present. The pericardial effusion is circumferential.  4. The mitral valve is normal in structure. No evidence of mitral valve regurgitation. No evidence of mitral stenosis.  5. The aortic valve is tricuspid. There is mild calcification of the aortic valve. There is mild thickening of the aortic valve. Aortic valve regurgitation is not visualized. No aortic stenosis is present.  6. The inferior vena cava is normal in size with greater than 50% respiratory variability, suggesting right atrial pressure of 3 mmHg. FINDINGS  Left Ventricle: Left ventricular ejection fraction, by estimation, is 60 to 65%. The left ventricle has normal function. The left ventricle has no regional wall motion abnormalities. The left ventricular internal cavity size was normal in size. There is  no left ventricular hypertrophy. Left ventricular diastolic parameters were normal. Right Ventricle: The right ventricular size is normal. Right vetricular wall thickness was not well visualized. Right ventricular systolic function is normal. Left Atrium: Left atrial size was normal in size. Right Atrium: Right atrial size was normal in size. Pericardium: A small pericardial effusion is present. The pericardial effusion is circumferential. Mitral Valve: The mitral valve is normal in structure. No  evidence of mitral valve regurgitation. No evidence of mitral valve stenosis. Tricuspid Valve: The tricuspid valve is normal in structure. Tricuspid valve regurgitation is not demonstrated. No evidence of tricuspid stenosis. Aortic Valve: The aortic valve is tricuspid. There is mild calcification of the aortic valve. There is mild thickening of the aortic valve. There is mild aortic valve annular calcification. Aortic valve regurgitation is not visualized. No aortic stenosis  is present. Aortic valve mean gradient measures 4.0 mmHg. Aortic valve peak gradient measures 7.7 mmHg. Aortic valve area, by VTI measures 1.64 cm. Pulmonic Valve: The pulmonic valve was not well visualized. Pulmonic valve regurgitation is not visualized. No evidence of pulmonic stenosis. Aorta: The aortic root is normal in size and structure. Venous: The inferior vena cava is normal in size with greater than 50% respiratory variability, suggesting right atrial pressure of 3 mmHg. IAS/Shunts: No atrial level shunt detected by color flow Doppler.  LEFT VENTRICLE PLAX 2D LVIDd:         5.30 cm   Diastology LVIDs:         3.30 cm   LV e' medial:    8.95 cm/s LV PW:         0.90 cm   LV E/e' medial:  15.0 LV IVS:        1.00 cm   LV e' lateral:   15.70 cm/s LVOT diam:     1.60 cm   LV E/e' lateral: 8.5 LV SV:         41 LV SV Index:   19 LVOT Area:     2.01 cm  RIGHT VENTRICLE RV S prime:     11.80 cm/s TAPSE (M-mode): 2.0 cm LEFT ATRIUM             Index        RIGHT ATRIUM           Index LA diam:        3.30 cm 1.55 cm/m   RA Area:     22.70 cm LA Vol (A2C):   61.1 ml 28.67  ml/m  RA Volume:   70.00 ml  32.84 ml/m LA Vol (A4C):   63.9 ml 29.98 ml/m LA Biplane Vol: 63.4 ml 29.74 ml/m  AORTIC VALVE AV Area (Vmax):    1.63 cm AV Area (Vmean):   1.63 cm AV Area (VTI):     1.64 cm AV Vmax:           139.00 cm/s AV Vmean:          88.200 cm/s AV VTI:            0.251 m AV Peak Grad:      7.7 mmHg AV Mean Grad:      4.0 mmHg LVOT Vmax:          113.00 cm/s LVOT Vmean:        71.600 cm/s LVOT VTI:          0.205 m LVOT/AV VTI ratio: 0.82  AORTA Ao Root diam: 3.90 cm MITRAL VALVE MV Area (PHT): 4.89 cm     SHUNTS MV Decel Time: 155 msec     Systemic VTI:  0.20 m MV E velocity: 134.00 cm/s  Systemic Diam: 1.60 cm MV A velocity: 90.70 cm/s MV E/A ratio:  1.48 Dorn Ross MD Electronically signed by Dorn Ross MD Signature Date/Time: 06/24/2024/1:46:16 PM    Final     Scheduled Meds:  benzonatate   200 mg Oral TID   budesonide  (PULMICORT ) nebulizer solution  0.25 mg Nebulization BID   Chlorhexidine  Gluconate Cloth  6 each Topical Daily   diltiazem   120 mg Oral Daily   fenofibrate   160 mg Oral Daily   finasteride   5 mg Oral Daily   guaiFENesin -dextromethorphan   10 mL Oral Q8H   insulin  aspart  0-20 Units Subcutaneous TID WC   ipratropium  0.5 mg Nebulization BID   levalbuterol   1.25 mg Nebulization BID   levofloxacin   750 mg Oral Daily   loratadine   10 mg Oral Daily   magnesium  oxide  400 mg Oral BID   methylPREDNISolone  sodium succinate  40 mg Intravenous Q12H   metoprolol  succinate  100 mg Oral Daily   rosuvastatin   40 mg Oral Daily   sodium chloride  flush  10-40 mL Intracatheter Q12H   tamsulosin   0.8 mg Oral Daily   Warfarin - Pharmacist Dosing Inpatient   Does not apply q1600   Continuous Infusions:   LOS: 4 days   Time spent: 57 mins  Mahaley Schwering Vicci, MD How to contact the [facility] Attending or Consulting provider 7A - 7P or covering provider during after hours 7P -7A, for this patient?  Check the care team in [facility] and look for a) attending/consulting [facility] provider listed and b) the [facility] team listed Log into [email or web address] to find provider on call.  Locate the [facility] provider you are looking for under [facility] and page to a number that you can be directly reached. If you still have difficulty reaching the provider, please page the [facility] (Director on Call) for the Hospitalists listed on amion for  assistance.  06/25/2024, 4:01 PM

## 2024-06-25 NOTE — Progress Notes (Signed)
 Rounding Note   Patient Name: Randall Sullivan Date of Encounter: 06/25/2024  [facility] Cardiologist: Lynwood Schilling, MD   Subjective SOB is improving  Scheduled Meds:  benzonatate   200 mg Oral TID   budesonide  (PULMICORT ) nebulizer solution  0.25 mg Nebulization BID   Chlorhexidine  Gluconate Cloth  6 each Topical Daily   diltiazem   120 mg Oral Daily   fenofibrate   160 mg Oral Daily   finasteride   5 mg Oral Daily   guaiFENesin -dextromethorphan   10 mL Oral Q8H   insulin  aspart  0-20 Units Subcutaneous TID WC   ipratropium  0.5 mg Nebulization BID   levalbuterol   1.25 mg Nebulization BID   levofloxacin   750 mg Oral Daily   loratadine   10 mg Oral Daily   magnesium  oxide  400 mg Oral BID   methylPREDNISolone  sodium succinate  40 mg Intravenous Q12H   metoprolol  succinate  100 mg Oral Daily   rosuvastatin   40 mg Oral Daily   sodium chloride  flush  10-40 mL Intracatheter Q12H   tamsulosin   0.8 mg Oral Daily   Warfarin - Pharmacist Dosing Inpatient   Does not apply q1600   Continuous Infusions:  PRN Meds: acetaminophen  **OR** acetaminophen , ALPRAZolam , bisacodyl , hydrALAZINE , HYDROmorphone  (DILAUDID ) injection, hydrOXYzine , levalbuterol , metoprolol  tartrate, ondansetron  **OR** ondansetron  (ZOFRAN ) IV, mouth rinse, oxyCODONE , senna-docusate, sodium chloride  flush, traZODone    Vital Signs  Vitals:   06/24/24 1943 06/25/24 0329 06/25/24 0532 06/25/24 0700  BP: 127/81 (!) 126/92  123/89  Pulse: 80 80  82  Resp: 20 20  20   Temp: 97.8 F (36.6 C) 97.6 F (36.4 C)  (!) 97.2 F (36.2 C)  TempSrc: Oral     SpO2: 97% 97%  92%  Weight:   95.2 kg   Height:        Intake/Output Summary (Last 24 hours) at 06/25/2024 0903 Last data filed at 06/24/2024 1700 Gross per 24 hour  Intake 240 ml  Output --  Net 240 ml      06/25/2024    5:32 AM 06/24/2024    4:47 AM 06/23/2024    5:00 AM  Last 3 Weights  Weight (lbs) 209 lb 14.1 oz 210 lb 1.6 oz 210 lb 12.2 oz  Weight (kg)  95.2 kg 95.3 kg 95.6 kg      Telemetry Aflutter variable rates - Personally Reviewed  ECG  N/a - Personally Reviewed  Physical Exam  GEN: No acute distress.   Neck: No JVD Cardiac: irreg Respiratory: Clear to auscultation bilaterally. GI: Soft, nontender, non-distended  MS: No edema; No deformity. Neuro:  Nonfocal  Psych: Normal affect   Labs High Sensitivity Troponin:   Recent Labs  Lab 06/21/24 1139  TROPONINIHS 16     Chemistry Recent Labs  Lab 06/21/24 1139 06/21/24 1141 06/23/24 0402 06/24/24 0350 06/25/24 0350  NA  --    < > 142 140 144  K  --    < > 4.4 4.1 4.5  CL  --    < > 99 97* 99  CO2  --    < > 31 32 32  GLUCOSE  --    < > 177* 182* 164*  BUN  --    < > 37* 42* 42*  CREATININE  --    < > 1.10 1.13 1.09  CALCIUM   --    < > 8.9 8.8* 9.0  MG 1.7  --  2.1  --   --   GFRNONAA  --    < > >  60 >60 >60  ANIONGAP  --    < > 12 11 13    < > = values in this interval not displayed.    Lipids  Recent Labs  Lab 06/22/24 0401  CHOL 95  TRIG 74  HDL 37*  LDLCALC 43  CHOLHDL 2.6    Hematology Recent Labs  Lab 06/23/24 0402 06/24/24 0350 06/25/24 0350  WBC 12.2* 12.1* 12.8*  RBC 3.96* 4.17* 4.57  HGB 10.0* 10.5* 11.4*  HCT 32.7* 34.5* 38.1*  MCV 82.6 82.7 83.4  MCH 25.3* 25.2* 24.9*  MCHC 30.6 30.4 29.9*  RDW 15.2 15.3 15.0  PLT 197 233 257   Thyroid  No results for input(s): TSH, FREET4 in the last 168 hours.  BNP Recent Labs  Lab 06/22/24 0401 06/23/24 0402 06/24/24 0350  BNP 211.0* 256.0* 379.0*    DDimer No results for input(s): DDIMER in the last 168 hours.   Radiology  ECHOCARDIOGRAM COMPLETE Result Date: 06/24/2024    ECHOCARDIOGRAM REPORT   Patient Name:   Randall Sullivan [facility] Date of Exam: 06/24/2024 Medical Rec #:  [id number]    Height:       70.0 in Accession #:    [id number]   Weight:       210.1 lb Date of Birth:  Apr 23, 1951    BSA:          2.131 m Patient Age:    73 years     BP:           118/72 mmHg Patient Gender: M             HR:           79 bpm. Exam Location:  [name] Procedure: 2D Echo, Cardiac Doppler and Color Doppler (Both Spectral and Color            Flow Doppler were utilized during procedure). Indications:    Atrial Flutter I48.92  History:        Patient has prior history of Echocardiogram examinations, most                 recent 03/13/2021. COPD, Arrythmias:Atrial Fibrillation; Risk                 Factors:Hypertension, Diabetes, Dyslipidemia and Former Smoker.                 Hx of lung cancer.  Sonographer:    Aida Pizza RCS Referring Phys: [id number] DORN FALCON Audry Kauzlarich IMPRESSIONS  1. Left ventricular ejection fraction, by estimation, is 60 to 65%. The left ventricle has normal function. The left ventricle has no regional wall motion abnormalities. Left ventricular diastolic parameters were normal.  2. Right ventricular systolic function is normal. The right ventricular size is normal.  3. A small pericardial effusion is present. The pericardial effusion is circumferential.  4. The mitral valve is normal in structure. No evidence of mitral valve regurgitation. No evidence of mitral stenosis.  5. The aortic valve is tricuspid. There is mild calcification of the aortic valve. There is mild thickening of the aortic valve. Aortic valve regurgitation is not visualized. No aortic stenosis is present.  6. The inferior vena cava is normal in size with greater than 50% respiratory variability, suggesting right atrial pressure of 3 mmHg. FINDINGS  Left Ventricle: Left ventricular ejection fraction, by estimation, is 60 to 65%. The left ventricle has normal function. The left ventricle has no regional wall motion abnormalities. The left ventricular internal cavity size was normal  in size. There is  no left ventricular hypertrophy. Left ventricular diastolic parameters were normal. Right Ventricle: The right ventricular size is normal. Right vetricular wall thickness was not well visualized. Right ventricular systolic function  is normal. Left Atrium: Left atrial size was normal in size. Right Atrium: Right atrial size was normal in size. Pericardium: A small pericardial effusion is present. The pericardial effusion is circumferential. Mitral Valve: The mitral valve is normal in structure. No evidence of mitral valve regurgitation. No evidence of mitral valve stenosis. Tricuspid Valve: The tricuspid valve is normal in structure. Tricuspid valve regurgitation is not demonstrated. No evidence of tricuspid stenosis. Aortic Valve: The aortic valve is tricuspid. There is mild calcification of the aortic valve. There is mild thickening of the aortic valve. There is mild aortic valve annular calcification. Aortic valve regurgitation is not visualized. No aortic stenosis  is present. Aortic valve mean gradient measures 4.0 mmHg. Aortic valve peak gradient measures 7.7 mmHg. Aortic valve area, by VTI measures 1.64 cm. Pulmonic Valve: The pulmonic valve was not well visualized. Pulmonic valve regurgitation is not visualized. No evidence of pulmonic stenosis. Aorta: The aortic root is normal in size and structure. Venous: The inferior vena cava is normal in size with greater than 50% respiratory variability, suggesting right atrial pressure of 3 mmHg. IAS/Shunts: No atrial level shunt detected by color flow Doppler.  LEFT VENTRICLE PLAX 2D LVIDd:         5.30 cm   Diastology LVIDs:         3.30 cm   LV e' medial:    8.95 cm/s LV PW:         0.90 cm   LV E/e' medial:  15.0 LV IVS:        1.00 cm   LV e' lateral:   15.70 cm/s LVOT diam:     1.60 cm   LV E/e' lateral: 8.5 LV SV:         41 LV SV Index:   19 LVOT Area:     2.01 cm  RIGHT VENTRICLE RV S prime:     11.80 cm/s TAPSE (M-mode): 2.0 cm LEFT ATRIUM             Index        RIGHT ATRIUM           Index LA diam:        3.30 cm 1.55 cm/m   RA Area:     22.70 cm LA Vol (A2C):   61.1 ml 28.67 ml/m  RA Volume:   70.00 ml  32.84 ml/m LA Vol (A4C):   63.9 ml 29.98 ml/m LA Biplane Vol: 63.4 ml  29.74 ml/m  AORTIC VALVE AV Area (Vmax):    1.63 cm AV Area (Vmean):   1.63 cm AV Area (VTI):     1.64 cm AV Vmax:           139.00 cm/s AV Vmean:          88.200 cm/s AV VTI:            0.251 m AV Peak Grad:      7.7 mmHg AV Mean Grad:      4.0 mmHg LVOT Vmax:         113.00 cm/s LVOT Vmean:        71.600 cm/s LVOT VTI:          0.205 m LVOT/AV VTI ratio: 0.82  AORTA Ao Root diam: 3.90 cm MITRAL  VALVE MV Area (PHT): 4.89 cm     SHUNTS MV Decel Time: 155 msec     Systemic VTI:  0.20 m MV E velocity: 134.00 cm/s  Systemic Diam: 1.60 cm MV A velocity: 90.70 cm/s MV E/A ratio:  1.48 Dorn Ross MD Electronically signed by Dorn Ross MD Signature Date/Time: 06/24/2024/1:46:16 PM    Final     Cardiac Studies 05/2024 echo 1. Left ventricular ejection fraction, by estimation, is 60 to 65%. The  left ventricle has normal function. The left ventricle has no regional  wall motion abnormalities. Left ventricular diastolic parameters were  normal.   2. Right ventricular systolic function is normal. The right ventricular  size is normal.   3. A small pericardial effusion is present. The pericardial effusion is  circumferential.   4. The mitral valve is normal in structure. No evidence of mitral valve  regurgitation. No evidence of mitral stenosis.   5. The aortic valve is tricuspid. There is mild calcification of the  aortic valve. There is mild thickening of the aortic valve. Aortic valve  regurgitation is not visualized. No aortic stenosis is present.   6. The inferior vena cava is normal in size with greater than 50%  respiratory variability, suggesting right atrial pressure of 3 mmHg.    Patient Profile   Mr. Belanger is a 73 y.o. male currently admitted to the hospital with community-acquired pneumonia and COPD exacerbation. He has a history of recurrent non-small cell lung cancer, initially underwent left VATS in 2017 with lobectomy and lymph node resection, follow-up right VATS with right  upper lobe nodule and bleb resection in 2020, status post chemoradiation with carboplatin  and paclitaxel , currently on Durvalumab  with follow-up by oncology. Cardiology consulted to assist with management of atrial fibrillation/flutter.   Assessment & Plan  1.PAF/aflutter - prior history of PAF - issues with new onset aflutter with RVR this admission in setting of pneumonia, COPD exacerbation - last several EKGs 2022 to 2024 showed NSR. From notes new onset afib 08/02/2016 after surgery, had VATs and LUL lobectomy. Transiently on anticoag, thought to be isolated postop afib and anticoag eventually stopped. 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 Its unclear the frequency of noted recurrent afib over the years. Appears to be first flutter episode - initially on IV dilt gtt - currently on dilt 120mg  daily, toprol  100mg  daily. Remains in aflutter with controlled rates. Remains in aflutter, elevated rates particularly with ambulation up to 160s yesterday.  - on coumadin  for stroke prevention   - from EKG review appears primarily to be in NSR at baseline.  - Flutter may have been primarily exacerbated by his pneumonia.   - INRs 2.4, 2.9, 2.3, 2.9, 3.9, 3.8 over the last month.  -will plan for DCCV today.   2.COPD/CAP - per primary team - on home O2 chronically 2 L   3. History of lung cancer - followed by oncology      For questions or updates, please contact [facility] Please consult [email or web address] for contact info under       Signed, Ross Dorn, MD  06/25/2024, 9:03 AM

## 2024-06-25 NOTE — Anesthesia Postprocedure Evaluation (Signed)
 Anesthesia Post Note  Patient: Randall Sullivan  Procedure(s) Performed: CARDIOVERSION  Patient location during evaluation: Phase II Anesthesia Type: General Level of consciousness: awake Pain management: pain level controlled Vital Signs Assessment: post-procedure vital signs reviewed and stable Respiratory status: spontaneous breathing and respiratory function stable Cardiovascular status: blood pressure returned to baseline and stable Postop Assessment: no headache and no apparent nausea or vomiting Anesthetic complications: no Comments: Late entry   No notable events documented.   Last Vitals:  Vitals:   06/25/24 1330 06/25/24 1358  BP: (!) 123/92 (!) 124/97  Pulse: 94 89  Resp: 18 18  Temp: 36.4 C 36.6 C  SpO2: 96% 98%    Last Pain:  Vitals:   06/25/24 1358  TempSrc: Oral  PainSc: 0-No pain                 [name]

## 2024-06-25 NOTE — Care Management Important Message (Signed)
 Important Message  Patient Details  Name: Randall Sullivan MRN: [id number] Date of Birth: 1950-11-24   Important Message Given:  Yes - Medicare IM     Lowen Mansouri L Lillyan Hitson 06/25/2024, 11:31 AM

## 2024-06-25 NOTE — Progress Notes (Signed)
 PT Cancellation Note  Patient Details Name: Randall Sullivan MRN: [id number] DOB: 16-Dec-1950   Cancelled Treatment:    Reason Eval/Treat Not Completed: Medical issues which prohibited therapy. Pt had an electrical cardioversion completed earlier in the day. LPN, PT, and pt communicated and determined to hold PT today.    Lacinda Fass, PT, DPT  06/25/2024, 1:56 PM

## 2024-06-25 NOTE — Progress Notes (Signed)
 PHARMACY - ANTICOAGULATION CONSULT NOTE  Pharmacy Consult for warfarin Indication: atrial fibrillation  No Known Allergies  Patient Measurements: Height: 5' 10 (177.8 cm) Weight: 95.2 kg (209 lb 14.1 oz) IBW/kg (Calculated) : 73 HEPARIN  DW (KG): 93.3  Vital Signs: Temp: 97.2 F (36.2 C) (09/26 0700) BP: 123/89 (09/26 0700) Pulse Rate: 82 (09/26 0700)  Labs: Recent Labs    06/23/24 0402 06/24/24 0350 06/25/24 0350  HGB 10.0* 10.5* 11.4*  HCT 32.7* 34.5* 38.1*  PLT 197 233 257  LABPROT 40.2* 39.2* 37.5*  INR 3.9* 3.8* 3.6*  CREATININE 1.10 1.13 1.09    Estimated Creatinine Clearance: 68.9 mL/min (by C-G formula based on SCr of 1.09 mg/dL).   Medical History: Past Medical History:  Diagnosis Date   A-fib (HCC) 2017   after last lung surgery patient had a history if Afib documented in hospital    AAA (abdominal aortic aneurysm)    4.0 cm 09/17/22 US    Acute appendicitis 07/14/2021   Arthritis    back & legs ( knees)   Cancer (HCC)    lung   Complication of anesthesia    agitated upon waking from anesth.    Dyspnea    Dysrhythmia    A. Fib   Emphysema    Emphysema of lung (HCC)    History of kidney stones    Hyperlipidemia    Hypertension    Oxygen  dependent    2L continuous   Peripheral vascular disease    Aortic Aneurysm   Pneumonia    Pre-diabetes    Sleep apnea    No OSA, but wears 2L [place] O2 continuous including at night   Stroke [facility]) 2022   Right sided weakness, decreased hearing, balance and memory loss   Tobacco abuse     Medications:  See med rec  Assessment: 74 yo male with history of a-fib on coumadin , and DOPD, AAA, Lung CA, HTN, HLD, PVD. Paitent with complaints of shortness of breath, cough, and palpitation. Patient reports his symptoms has been progressively worsening in the past few days. Pharmacy asked to dose warfarin   INR still elevated at 3.6 but slight trend down overnight.Will continue to hold warfarin until INR down  significantly. No bleeding issues noted. CBC stable.   Would limit duration of levofloxacin  as able as this is likely cause of labile INR. Steroids could also be potentiating.   Home dose per anticoag clinic: 5mg  on MWF, and 2.5mg  all other days.  Goal of Therapy:  INR 2-3 Monitor platelets by anticoagulation protocol: Yes   Plan:  No Warfarin today Daily PT-INR Limit duration of abx as able  Dempsey Blush PharmD., BCPS Clinical Pharmacist 06/25/2024 8:39 AM

## 2024-06-25 NOTE — Transfer of Care (Signed)
 Immediate Anesthesia Transfer of Care Note  Patient: Randall Sullivan  Procedure(s) Performed: CARDIOVERSION  Patient Location: PACU  Anesthesia Type:MAC  Level of Consciousness: awake, alert , oriented, and patient cooperative  Airway & Oxygen  Therapy: Patient Spontanous Breathing and Patient connected to face mask oxygen   Post-op Assessment: Report given to RN and Post -op Vital signs reviewed and stable  Post vital signs: Reviewed and stable  Last Vitals:  Vitals Value Taken Time  BP 120/86 06/25/24 12:42  Temp 97.8   Pulse 68 06/25/24 12:44  Resp 23 06/25/24 12:44  SpO2 100 % 06/25/24 12:44    Last Pain:  Vitals:   06/25/24 0943  TempSrc:   PainSc: 0-No pain         Complications: No notable events documented.

## 2024-06-26 DIAGNOSIS — C3490 Malignant neoplasm of unspecified part of unspecified bronchus or lung: Secondary | ICD-10-CM

## 2024-06-26 DIAGNOSIS — I10 Essential (primary) hypertension: Secondary | ICD-10-CM | POA: Diagnosis not present

## 2024-06-26 DIAGNOSIS — J441 Chronic obstructive pulmonary disease with (acute) exacerbation: Secondary | ICD-10-CM | POA: Diagnosis not present

## 2024-06-26 DIAGNOSIS — I4891 Unspecified atrial fibrillation: Secondary | ICD-10-CM | POA: Diagnosis not present

## 2024-06-26 LAB — CBC
HCT: 36.3 % — ABNORMAL LOW (ref 39.0–52.0)
Hemoglobin: 11 g/dL — ABNORMAL LOW (ref 13.0–17.0)
MCH: 25.2 pg — ABNORMAL LOW (ref 26.0–34.0)
MCHC: 30.3 g/dL (ref 30.0–36.0)
MCV: 83.1 fL (ref 80.0–100.0)
Platelets: 208 K/uL (ref 150–400)
RBC: 4.37 MIL/uL (ref 4.22–5.81)
RDW: 15.1 % (ref 11.5–15.5)
WBC: 10.8 K/uL — ABNORMAL HIGH (ref 4.0–10.5)
nRBC: 0 % (ref 0.0–0.2)

## 2024-06-26 LAB — BASIC METABOLIC PANEL WITH GFR
Anion gap: 9 (ref 5–15)
BUN: 50 mg/dL — ABNORMAL HIGH (ref 8–23)
CO2: 35 mmol/L — ABNORMAL HIGH (ref 22–32)
Calcium: 8.7 mg/dL — ABNORMAL LOW (ref 8.9–10.3)
Chloride: 96 mmol/L — ABNORMAL LOW (ref 98–111)
Creatinine, Ser: 1.11 mg/dL (ref 0.61–1.24)
GFR, Estimated: >60 mL/min (ref >60)
Glucose, Bld: 174 mg/dL — ABNORMAL HIGH (ref 70–99)
Potassium: 4.4 mmol/L (ref 3.5–5.1)
Sodium: 140 mmol/L (ref 135–145)

## 2024-06-26 LAB — GLUCOSE, CAPILLARY: Glucose-Capillary: 162 mg/dL — ABNORMAL HIGH (ref 70–99)

## 2024-06-26 LAB — PROTIME-INR
INR: 3.6 — ABNORMAL HIGH (ref 0.8–1.2)
Prothrombin Time: 37.5 s — ABNORMAL HIGH (ref 11.4–15.2)

## 2024-06-26 MED ORDER — ALBUTEROL SULFATE HFA 108 (90 BASE) MCG/ACT IN AERS: 2.0000 | INHALATION_SPRAY | RESPIRATORY_TRACT | 2 refills | Status: AC | PRN

## 2024-06-26 MED ORDER — HEPARIN SOD (PORK) LOCK FLUSH 100 UNIT/ML IV SOLN
500.0000 [IU] | Freq: Once | INTRAVENOUS | Status: AC
  Administered 2024-06-26: 500 [IU] via INTRAVENOUS
  Filled 2024-06-26: qty 5

## 2024-06-26 MED ORDER — GUAIFENESIN-DM 100-10 MG/5ML PO SYRP: 10.0000 mL | ORAL_SOLUTION | Freq: Three times a day (TID) | ORAL | Status: AC | PRN

## 2024-06-26 MED ORDER — WARFARIN SODIUM 5 MG PO TABS: 2.5000 mg | ORAL_TABLET | ORAL | Status: AC

## 2024-06-26 MED ORDER — PREDNISONE 20 MG PO TABS: 40.0000 mg | ORAL_TABLET | Freq: Every day | ORAL | 0 refills | Status: AC

## 2024-06-26 NOTE — Discharge Instructions (Signed)
 PLEASE HOLD WARFARIN FOR NEXT 2 DAYS AND RESTART WARFARIN ON MONDAY  PLEASE HAVE YOUR PT/INR CHECKED EARLY NEXT WEEK ON WED OR THURS  IMPORTANT INFORMATION: PAY CLOSE ATTENTION   PHYSICIAN DISCHARGE INSTRUCTIONS  Follow with Primary care provider  Davonna Siad, MD  and other consultants as instructed by your Hospitalist Physician  SEEK MEDICAL CARE OR RETURN TO EMERGENCY ROOM IF SYMPTOMS COME BACK, WORSEN OR NEW PROBLEM DEVELOPS   Please note: You were cared for by a hospitalist during your hospital stay. Every effort will be made to forward records to your primary care provider.  You can request that your primary care provider send for your hospital records if they have not received them.  Once you are discharged, your primary care physician will handle any further medical issues. Please note that NO REFILLS for any discharge medications will be authorized once you are discharged, as it is imperative that you return to your primary care physician (or establish a relationship with a primary care physician if you do not have one) for your post hospital discharge needs so that they can reassess your need for medications and monitor your lab values.  Please get a complete blood count and chemistry panel checked by your Primary MD at your next visit, and again as instructed by your Primary MD.  Get Medicines reviewed and adjusted: Please take all your medications with you for your next visit with your Primary MD  Laboratory/radiological data: Please request your Primary MD to go over all hospital tests and procedure/radiological results at the follow up, please ask your primary care provider to get all Hospital records sent to his/her office.  In some cases, they will be blood work, cultures and biopsy results pending at the time of your discharge. Please request that your primary care provider follow up on these results.  If you are diabetic, please bring your blood sugar readings with you  to your follow up appointment with primary care.    Please call and make your follow up appointments as soon as possible.    Also Note the following: If you experience worsening of your admission symptoms, develop shortness of breath, life threatening emergency, suicidal or homicidal thoughts you must seek medical attention immediately by calling 911 or calling your MD immediately  if symptoms less severe.  You must read complete instructions/literature along with all the possible adverse reactions/side effects for all the Medicines you take and that have been prescribed to you. Take any new Medicines after you have completely understood and accpet all the possible adverse reactions/side effects.   Do not drive when taking Pain medications or sleeping medications (Benzodiazepines)  Do not take more than prescribed Pain, Sleep and Anxiety Medications. It is not advisable to combine anxiety,sleep and pain medications without talking with your primary care practitioner  Special Instructions: If you have smoked or chewed Tobacco  in the last 2 yrs please stop smoking, stop any regular Alcohol  and or any Recreational drug use.  Wear Seat belts while driving.  Do not drive if taking any narcotic, mind altering or controlled substances or recreational drugs or alcohol.

## 2024-06-26 NOTE — Progress Notes (Signed)
 PHARMACY - ANTICOAGULATION CONSULT NOTE  Pharmacy Consult for warfarin Indication: atrial fibrillation  No Known Allergies  Patient Measurements: Height: 5' 10 (177.8 cm) Weight: 95 kg (209 lb 7 oz) IBW/kg (Calculated) : 73 HEPARIN  DW (KG): 93.3  Vital Signs: Temp: 98.2 F (36.8 C) (09/27 0812) Temp Source: Oral (09/27 0812) BP: 153/85 (09/27 0812) Pulse Rate: 95 (09/27 0812)  Labs: Recent Labs    06/24/24 0350 06/25/24 0350 06/26/24 0408  HGB 10.5* 11.4* 11.0*  HCT 34.5* 38.1* 36.3*  PLT 233 257 208  LABPROT 39.2* 37.5* 37.5*  INR 3.8* 3.6* 3.6*  CREATININE 1.13 1.09 1.11    Estimated Creatinine Clearance: 67.6 mL/min (by C-G formula based on SCr of 1.11 mg/dL).   Medical History: Past Medical History:  Diagnosis Date   A-fib (HCC) 2017   after last lung surgery patient had a history if Afib documented in hospital    AAA (abdominal aortic aneurysm)    4.0 cm 09/17/22 US    Acute appendicitis 07/14/2021   Arthritis    back & legs ( knees)   Cancer (HCC)    lung   Complication of anesthesia    agitated upon waking from anesth.    Dyspnea    Dysrhythmia    A. Fib   Emphysema    Emphysema of lung (HCC)    History of kidney stones    Hyperlipidemia    Hypertension    Oxygen  dependent    2L continuous   Peripheral vascular disease    Aortic Aneurysm   Pneumonia    Pre-diabetes    Sleep apnea    No OSA, but wears 2L [place] O2 continuous including at night   Stroke [facility]) 2022   Right sided weakness, decreased hearing, balance and memory loss   Tobacco abuse     Medications:  See med rec  Assessment: 73 yo male with history of a-fib on coumadin , and DOPD, AAA, Lung CA, HTN, HLD, PVD. Paitent with complaints of shortness of breath, cough, and palpitation. Patient reports his symptoms has been progressively worsening in the past few days. Pharmacy asked to dose warfarin   INR still elevated at 3.6   Home dose per anticoag clinic: 5mg  on MWF, and 2.5mg   all other days.  Goal of Therapy:  INR 2-3 Monitor platelets by anticoagulation protocol: Yes   Plan:  No Warfarin today Daily PT-INR   Elspeth Sour, PharmD Clinical Pharmacist 06/26/2024 9:12 AM

## 2024-06-26 NOTE — Discharge Summary (Addendum)
 Physician Discharge Summary  Randall Sullivan:[id number] DOB: 1951/07/02 DOA: 06/21/2024  PCP: Davonna Siad, MD  Admit date: 06/21/2024 Discharge date: 06/26/2024  Admitted From:  Home  Disposition: Home with HH   Recommendations for Outpatient Follow-up:  Follow up with PCP in 1-2 weeks Follow up with cardiology in 2 weeks  Follow up with anticoagulation clinic as scheduled for PT/INR CHECK IN 5 DAYS  Home Health:  PT, RN   Discharge Condition: STABLE   CODE STATUS: FULL DIET:  heart healthy food recommended    Brief Hospitalization Summary: Please see all hospital notes, images, labs for full details of the hospitalization. Admission provider HPI:  Randall Sullivan is a 73 year old with history of A-fib, on Coumadin , COPD, AAA, lung cancer, HTN, HLD, PVD, prediabetic. He presenting to ED with chief complaint of shortness of breath, cough, and palpitation. Patient reports his symptoms has been progressively worsening in the past few days.  Denies any chest pain, denies any fever or chills, nausea or vomiting.  No excessive swelling in extremities.    On arrival patient was found in A-fib with RVR, with a heart rate of 154 started on Cardizem  drip.    Requested patient to be admitted for rate control, acute on chronic respiratory failure, COPD exacerbation, treat for possible pneumonia  Hospital course by listed problems addressed  Atrial fibrillation with RVR - TREATED / RESOLVED  POA; in A-fib with RVR heart rate as high as 154 >> 87 With history of A-fib -Likely exacerbated by shortness of breath, COPD exacerbation -Resume home medication: Including metoprolol  - Continue Coumadin  INR therapeutic 2.3 - pt s/p successful DCCV with Dr. Alvan 06/25/24  - pt was monitored overnight and has remained stable, remained in SR - DC home today, continue metoprolol  succinate 100 mg and diltiazem  CD 120 mg daily     CAP (community acquired pneumonia) - Possibly community-acquired  pneumonia, chronic on chest x-ray - Chest x-ray patchy right upper and right basilar airspace opacification,indicative of pneumonia.2. Small loculated right pleural fluid.3. Right upper lobe wedge resection.  Left upper lobectom -No leukocytosis, afebrile -Patient received IV azithromycin /Rocephin >> p.o. Levaquin -- completed course in hospital  -Follow-up with cultures, will try to obtain sputum culture -Continue nebs, steroids   Peripheral neuropathy - Currently not on any medication, monitoring closely   Recurrent non-small cell lung cancer (NSCLC) - Patient is to follow-up with a primary oncologist -Currently not on chemo or radiation therapy   COPD exacerbation  - Acute on chronic respiratory failure, with history of COPD, currently COPD exacerbation -Currently satting 96% on 3 L of oxygen  -Continue supplemental oxygen  with goal O2 sat of 88-92% -Tapering off O2 supplements, IV steroids -Solu-Medrol  40 mg IV BID with plan to DC home on oral prednisone  x 5 more days to complete course -Continue with DuoNeb bronchodilators, mucolytics -On encouraging spirometer and flutter valve   History of CVA with residual deficit - Continue Coumadin , statins,   Insomnia - As needed trazodone    Infrarenal abdominal aortic aneurysm (AAA) without rupture - Monitoring closely -Keeping blood pressure check -Follow-up as an outpatient   Type 2 diabetes mellitus with other specified complication  - Prediabetic with hemoglobin of 5.4 -Will check CBG Q ACHS, SSI coverage CBG (last 3)  Recent Labs (last 2 labs)       Recent Labs    06/25/24 0838 06/25/24 1153 06/25/24 1301  GLUCAP 137* 128* 137*      Benign prostatic hyperplasia with nocturia - Review home medication,  continue Flomax , finasteride    Hyperlipidemia - Statins, fibrate, fish oil   S/P lobectomy of lung - Monitor closely, in respite distress due to pneumonia, COPD exacerbation   Essential (primary) hypertension -  Blood pressure stable review home medication -Will resume accordingly, currently blood pressure running soft on Cardizem  drip  Discharge Diagnoses:  Principal Problem:   Atrial fibrillation with RVR (HCC) Active Problems:   Essential (primary) hypertension   S/P lobectomy of lung   Hyperlipidemia   Benign prostatic hyperplasia with nocturia   Type 2 diabetes mellitus with other specified complication (HCC)   Infrarenal abdominal aortic aneurysm (AAA) without rupture   Insomnia   History of CVA with residual deficit   COPD exacerbation (HCC)   Recurrent non-small cell lung cancer (NSCLC) (HCC)   Allergic dermatitis   Peripheral neuropathy   CAP (community acquired pneumonia)   Atrial fibrillation [facility])   Discharge Instructions: Discharge Instructions     Ambulatory referral to Cardiology   Complete by: As directed    Hospital follow up after DCCV      Allergies as of 06/26/2024   No Known Allergies      Medication List     STOP taking these medications    ALPRAZolam  0.5 MG tablet Commonly known as: XANAX        TAKE these medications    albuterol  108 (90 Base) MCG/ACT inhaler Commonly known as: VENTOLIN  HFA Inhale 2 puffs into the lungs every 4 (four) hours as needed for wheezing or shortness of breath.   ALOXI  IV Inject into the vein.   cetirizine  10 MG tablet Commonly known as: ZYRTEC  Take 10 mg by mouth daily.   diltiazem  120 MG 24 hr capsule Commonly known as: CARDIZEM  CD Take 1 capsule (120 mg total) by mouth daily.   fenofibrate  145 MG tablet Commonly known as: TRICOR  Take 1 tablet (145 mg total) by mouth daily.   finasteride  5 MG tablet Commonly known as: PROSCAR  Take 1 tablet (5 mg total) by mouth daily.   guaiFENesin -dextromethorphan  100-10 MG/5ML syrup Commonly known as: ROBITUSSIN DM Take 10 mLs by mouth every 8 (eight) hours as needed for cough.   hydrOXYzine  25 MG tablet Commonly known as: ATARAX  Take 1 tablet (25 mg total) by  mouth every 8 (eight) hours as needed for itching.   magnesium  oxide 400 (240 Mg) MG tablet Commonly known as: MAG-OX Take 1 tablet (400 mg total) by mouth 2 (two) times daily.   metoprolol  succinate 100 MG 24 hr tablet Commonly known as: Toprol  XL Take 1 tablet (100 mg total) by mouth daily. Take with or immediately following a meal. What changed:  medication strength how much to take   OXYGEN  Inhale 2 L into the lungs continuous. Hx of lung ca, copd, and emphysema   predniSONE  20 MG tablet Commonly known as: DELTASONE  Take 2 tablets (40 mg total) by mouth daily with breakfast for 5 days.   rosuvastatin  40 MG tablet Commonly known as: CRESTOR  Take 1 tablet (40 mg total) by mouth daily.   tamsulosin  0.4 MG Caps capsule Commonly known as: FLOMAX  Take 2 capsules (0.8 mg total) by mouth daily.   warfarin 5 MG tablet Commonly known as: COUMADIN  Take as directed. If you are unsure how to take this medication, talk to your nurse or doctor. Original instructions: Take 0.5-1 tablets (2.5-5 mg total) by mouth See admin instructions. Take 2.5 mg by mouth Tuesday and Friday and take 5 mg on Monday, Wednesday, Thursday, Saturday and Sunday  Follow-up Information     [facility]  Follow up.   Why: Agency will call to set up first home visit. Contact information: [address] [place] [address] [phone]         Davonna Siad, MD. Schedule an appointment as soon as possible for a visit in 1 week(s).   Specialty: Oncology Why: Hospital Follow Up Contact information: [address] [place] [address] [phone]         Lavona Agent, MD. Schedule an appointment as soon as possible for a visit in 2 week(s).   Specialty: Cardiology Why: Hospital Follow Up Contact information: [address] [place] [address] [phone]                No Known Allergies Allergies as of 06/26/2024   No Known Allergies       Medication List     STOP taking these medications    ALPRAZolam  0.5 MG tablet Commonly known as: XANAX        TAKE these medications    albuterol  108 (90 Base) MCG/ACT inhaler Commonly known as: VENTOLIN  HFA Inhale 2 puffs into the lungs every 4 (four) hours as needed for wheezing or shortness of breath.   ALOXI  IV Inject into the vein.   cetirizine  10 MG tablet Commonly known as: ZYRTEC  Take 10 mg by mouth daily.   diltiazem  120 MG 24 hr capsule Commonly known as: CARDIZEM  CD Take 1 capsule (120 mg total) by mouth daily.   fenofibrate  145 MG tablet Commonly known as: TRICOR  Take 1 tablet (145 mg total) by mouth daily.   finasteride  5 MG tablet Commonly known as: PROSCAR  Take 1 tablet (5 mg total) by mouth daily.   guaiFENesin -dextromethorphan  100-10 MG/5ML syrup Commonly known as: ROBITUSSIN DM Take 10 mLs by mouth every 8 (eight) hours as needed for cough.   hydrOXYzine  25 MG tablet Commonly known as: ATARAX  Take 1 tablet (25 mg total) by mouth every 8 (eight) hours as needed for itching.   magnesium  oxide 400 (240 Mg) MG tablet Commonly known as: MAG-OX Take 1 tablet (400 mg total) by mouth 2 (two) times daily.   metoprolol  succinate 100 MG 24 hr tablet Commonly known as: Toprol  XL Take 1 tablet (100 mg total) by mouth daily. Take with or immediately following a meal. What changed:  medication strength how much to take   OXYGEN  Inhale 2 L into the lungs continuous. Hx of lung ca, copd, and emphysema   predniSONE  20 MG tablet Commonly known as: DELTASONE  Take 2 tablets (40 mg total) by mouth daily with breakfast for 5 days.   rosuvastatin  40 MG tablet Commonly known as: CRESTOR  Take 1 tablet (40 mg total) by mouth daily.   tamsulosin  0.4 MG Caps capsule Commonly known as: FLOMAX  Take 2 capsules (0.8 mg total) by mouth daily.   warfarin 5 MG tablet Commonly known as: COUMADIN  Take as directed. If you are unsure how to take this  medication, talk to your nurse or doctor. Original instructions: Take 0.5-1 tablets (2.5-5 mg total) by mouth See admin instructions. Take 2.5 mg by mouth Tuesday and Friday and take 5 mg on Monday, Wednesday, Thursday, Saturday and Sunday        Procedures/Studies: ECHOCARDIOGRAM COMPLETE Result Date: 06/24/2024    ECHOCARDIOGRAM REPORT   Patient Name:   Randall Sullivan [facility] Date of Exam: 06/24/2024 Medical Rec #:  [id number]    Height:  70.0 in Accession #:    [id number]   Weight:       210.1 lb Date of Birth:  01-19-1951    BSA:          2.131 m Patient Age:    73 years     BP:           118/72 mmHg Patient Gender: M            HR:           79 bpm. Exam Location:  [name] Procedure: 2D Echo, Cardiac Doppler and Color Doppler (Both Spectral and Color            Flow Doppler were utilized during procedure). Indications:    Atrial Flutter I48.92  History:        Patient has prior history of Echocardiogram examinations, most                 recent 03/13/2021. COPD, Arrythmias:Atrial Fibrillation; Risk                 Factors:Hypertension, Diabetes, Dyslipidemia and Former Smoker.                 Hx of lung cancer.  Sonographer:    Aida Pizza RCS Referring Phys: [id number] DORN FALCON BRANCH IMPRESSIONS  1. Left ventricular ejection fraction, by estimation, is 60 to 65%. The left ventricle has normal function. The left ventricle has no regional wall motion abnormalities. Left ventricular diastolic parameters were normal.  2. Right ventricular systolic function is normal. The right ventricular size is normal.  3. A small pericardial effusion is present. The pericardial effusion is circumferential.  4. The mitral valve is normal in structure. No evidence of mitral valve regurgitation. No evidence of mitral stenosis.  5. The aortic valve is tricuspid. There is mild calcification of the aortic valve. There is mild thickening of the aortic valve. Aortic valve regurgitation is not visualized. No aortic stenosis is  present.  6. The inferior vena cava is normal in size with greater than 50% respiratory variability, suggesting right atrial pressure of 3 mmHg. FINDINGS  Left Ventricle: Left ventricular ejection fraction, by estimation, is 60 to 65%. The left ventricle has normal function. The left ventricle has no regional wall motion abnormalities. The left ventricular internal cavity size was normal in size. There is  no left ventricular hypertrophy. Left ventricular diastolic parameters were normal. Right Ventricle: The right ventricular size is normal. Right vetricular wall thickness was not well visualized. Right ventricular systolic function is normal. Left Atrium: Left atrial size was normal in size. Right Atrium: Right atrial size was normal in size. Pericardium: A small pericardial effusion is present. The pericardial effusion is circumferential. Mitral Valve: The mitral valve is normal in structure. No evidence of mitral valve regurgitation. No evidence of mitral valve stenosis. Tricuspid Valve: The tricuspid valve is normal in structure. Tricuspid valve regurgitation is not demonstrated. No evidence of tricuspid stenosis. Aortic Valve: The aortic valve is tricuspid. There is mild calcification of the aortic valve. There is mild thickening of the aortic valve. There is mild aortic valve annular calcification. Aortic valve regurgitation is not visualized. No aortic stenosis  is present. Aortic valve mean gradient measures 4.0 mmHg. Aortic valve peak gradient measures 7.7 mmHg. Aortic valve area, by VTI measures 1.64 cm. Pulmonic Valve: The pulmonic valve was not well visualized. Pulmonic valve regurgitation is not visualized. No evidence of pulmonic stenosis. Aorta: The aortic root is normal  in size and structure. Venous: The inferior vena cava is normal in size with greater than 50% respiratory variability, suggesting right atrial pressure of 3 mmHg. IAS/Shunts: No atrial level shunt detected by color flow Doppler.   LEFT VENTRICLE PLAX 2D LVIDd:         5.30 cm   Diastology LVIDs:         3.30 cm   LV e' medial:    8.95 cm/s LV PW:         0.90 cm   LV E/e' medial:  15.0 LV IVS:        1.00 cm   LV e' lateral:   15.70 cm/s LVOT diam:     1.60 cm   LV E/e' lateral: 8.5 LV SV:         41 LV SV Index:   19 LVOT Area:     2.01 cm  RIGHT VENTRICLE RV S prime:     11.80 cm/s TAPSE (M-mode): 2.0 cm LEFT ATRIUM             Index        RIGHT ATRIUM           Index LA diam:        3.30 cm 1.55 cm/m   RA Area:     22.70 cm LA Vol (A2C):   61.1 ml 28.67 ml/m  RA Volume:   70.00 ml  32.84 ml/m LA Vol (A4C):   63.9 ml 29.98 ml/m LA Biplane Vol: 63.4 ml 29.74 ml/m  AORTIC VALVE AV Area (Vmax):    1.63 cm AV Area (Vmean):   1.63 cm AV Area (VTI):     1.64 cm AV Vmax:           139.00 cm/s AV Vmean:          88.200 cm/s AV VTI:            0.251 m AV Peak Grad:      7.7 mmHg AV Mean Grad:      4.0 mmHg LVOT Vmax:         113.00 cm/s LVOT Vmean:        71.600 cm/s LVOT VTI:          0.205 m LVOT/AV VTI ratio: 0.82  AORTA Ao Root diam: 3.90 cm MITRAL VALVE MV Area (PHT): 4.89 cm     SHUNTS MV Decel Time: 155 msec     Systemic VTI:  0.20 m MV E velocity: 134.00 cm/s  Systemic Diam: 1.60 cm MV A velocity: 90.70 cm/s MV E/A ratio:  1.48 Dorn Ross MD Electronically signed by Dorn Ross MD Signature Date/Time: 06/24/2024/1:46:16 PM    Final    DG Chest Port 1 View Result Date: 06/21/2024 CLINICAL DATA:  Shortness of breath, tachycardia, lung cancer. EXAM: PORTABLE CHEST 1 VIEW COMPARISON:  CT chest 05/18/2024 and chest radiograph 10/21/2023. FINDINGS: Left IJ Port-A-Cath terminates in the low SVC. Trachea is midline. Heart size is accentuated by AP semi upright technique. Right upper lobe wedge resection. Patchy opacification in the right upper lobe and right lung base, new from 05/18/2024. Right perihilar consolidation, better seen on 05/18/2024. Rind of loculated pleural fluid in the right hemithorax left lung is grossly  clear. Left upper lobectomy per report. No left pleural fluid. IMPRESSION: 1. Patchy right upper and right basilar airspace opacification, indicative of pneumonia. 2. Small loculated right pleural fluid. 3. Right upper lobe wedge resection.  Left upper lobectomy. 4. Nodular right  perihilar consolidation, better evaluated on 05/18/2024 CT chest. Electronically Signed   By: Newell Eke M.D.   On: 06/21/2024 12:49     Subjective: Pt says he is feeling a lot better today.  No CP, no SOB and no palpitations.   Discharge Exam: Vitals:   06/26/24 0411 06/26/24 0812  BP: (!) 149/79 (!) 153/85  Pulse: 66 95  Resp: (!) 22 20  Temp: (!) 97.4 F (36.3 C) 98.2 F (36.8 C)  SpO2: 98% 96%   Vitals:   06/25/24 2003 06/26/24 0239 06/26/24 0411 06/26/24 0812  BP: (!) 142/83  (!) 149/79 (!) 153/85  Pulse: 84  66 95  Resp: (!) 22  (!) 22 20  Temp: 98.3 F (36.8 C)  (!) 97.4 F (36.3 C) 98.2 F (36.8 C)  TempSrc: Oral  Oral Oral  SpO2: 98%  98% 96%  Weight:  95 kg    Height:       General: Pt is alert, awake, not in acute distress Cardiovascular: normal S1/S2 +, no rubs, no gallops Respiratory: CTA bilaterally, no wheezing, no rhonchi Abdominal: Soft, NT, ND, bowel sounds + Extremities: no edema, no cyanosis   The results of significant diagnostics from this hospitalization (including imaging, microbiology, ancillary and laboratory) are listed below for reference.     Microbiology: Recent Results (from the past 240 hours)  Resp panel by RT-PCR (RSV, Flu A&B, Covid) Anterior Nasal Swab     Status: None   Collection Time: 06/21/24 11:57 AM   Specimen: Anterior Nasal Swab  Result Value Ref Range Status   SARS Coronavirus 2 by RT PCR NEGATIVE NEGATIVE Final    Comment: (NOTE) SARS-CoV-2 target nucleic acids are NOT DETECTED.  The SARS-CoV-2 RNA is generally detectable in upper respiratory specimens during the acute phase of infection. The lowest concentration of SARS-CoV-2 viral copies  this assay can detect is 138 copies/mL. A negative result does not preclude SARS-Cov-2 infection and should not be used as the sole basis for treatment or other patient management decisions. A negative result may occur with  improper specimen collection/handling, submission of specimen other than nasopharyngeal swab, presence of viral mutation(s) within the areas targeted by this assay, and inadequate number of viral copies(<138 copies/mL). A negative result must be combined with clinical observations, patient history, and epidemiological information. The expected result is Negative.  Fact Sheet for Patients:  BloggerCourse.com  Fact Sheet for Healthcare Providers:  SeriousBroker.it  This test is no t yet approved or cleared by the [place]  FDA and  has been authorized for detection and/or diagnosis of SARS-CoV-2 by FDA under an Emergency Use Authorization (EUA). This EUA will remain  in effect (meaning this test can be used) for the duration of the COVID-19 declaration under Section 564(b)(1) of the Act, 21 U.S.C.section 360bbb-3(b)(1), unless the authorization is terminated  or revoked sooner.       Influenza A by PCR NEGATIVE NEGATIVE Final   Influenza B by PCR NEGATIVE NEGATIVE Final    Comment: (NOTE) The Xpert Xpress SARS-CoV-2/FLU/RSV plus assay is intended as an aid in the diagnosis of influenza from Nasopharyngeal swab specimens and should not be used as a sole basis for treatment. Nasal washings and aspirates are unacceptable for Xpert Xpress SARS-CoV-2/FLU/RSV testing.  Fact Sheet for Patients: BloggerCourse.com  Fact Sheet for Healthcare Providers: SeriousBroker.it  This test is not yet approved or cleared by the [place]  FDA and has been authorized for detection and/or diagnosis of SARS-CoV-2 by FDA under an  Emergency Use Authorization (EUA). This EUA  will remain in effect (meaning this test can be used) for the duration of the COVID-19 declaration under Section 564(b)(1) of the Act, 21 U.S.C. section 360bbb-3(b)(1), unless the authorization is terminated or revoked.     Resp Syncytial Virus by PCR NEGATIVE NEGATIVE Final    Comment: (NOTE) Fact Sheet for Patients: BloggerCourse.com  Fact Sheet for Healthcare Providers: SeriousBroker.it  This test is not yet approved or cleared by the [place]  FDA and has been authorized for detection and/or diagnosis of SARS-CoV-2 by FDA under an Emergency Use Authorization (EUA). This EUA will remain in effect (meaning this test can be used) for the duration of the COVID-19 declaration under Section 564(b)(1) of the Act, 21 U.S.C. section 360bbb-3(b)(1), unless the authorization is terminated or revoked.  Performed at [facility], [address], [place], [place] [address]      Labs: BNP (last 3 results) Recent Labs    06/22/24 0401 06/23/24 0402 06/24/24 0350  BNP 211.0* 256.0* 379.0*   Basic Metabolic Panel: Recent Labs  Lab 06/21/24 1139 06/21/24 1141 06/22/24 0401 06/23/24 0402 06/24/24 0350 06/25/24 0350 06/26/24 0408  NA  --    < > 138 142 140 144 140  K  --    < > 4.5 4.4 4.1 4.5 4.4  CL  --    < > 99 99 97* 99 96*  CO2  --    < > 26 31 32 32 35*  GLUCOSE  --    < > 168* 177* 182* 164* 174*  BUN  --    < > 26* 37* 42* 42* 50*  CREATININE  --    < > 1.04 1.10 1.13 1.09 1.11  CALCIUM   --    < > 8.8* 8.9 8.8* 9.0 8.7*  MG 1.7  --   --  2.1  --   --   --   PHOS 2.4*  --   --   --   --   --   --    < > = values in this interval not displayed.   Liver Function Tests: No results for input(s): AST, ALT, ALKPHOS, BILITOT, PROT, ALBUMIN  in the last 168 hours. No results for input(s): LIPASE, AMYLASE in the last 168 hours. No results for input(s): AMMONIA in the last 168 hours. CBC: Recent Labs   Lab 06/22/24 0401 06/23/24 0402 06/24/24 0350 06/25/24 0350 06/26/24 0408  WBC 8.6 12.2* 12.1* 12.8* 10.8*  HGB 10.3* 10.0* 10.5* 11.4* 11.0*  HCT 33.1* 32.7* 34.5* 38.1* 36.3*  MCV 82.1 82.6 82.7 83.4 83.1  PLT 187 197 233 257 208   Cardiac Enzymes: No results for input(s): CKTOTAL, CKMB, CKMBINDEX, TROPONINI in the last 168 hours. BNP: Invalid input(s): POCBNP CBG: Recent Labs  Lab 06/25/24 1153 06/25/24 1301 06/25/24 1648 06/25/24 2006 06/26/24 0811  GLUCAP 128* 137* 221* 209* 162*   D-Dimer No results for input(s): DDIMER in the last 72 hours. Hgb A1c No results for input(s): HGBA1C in the last 72 hours. Lipid Profile No results for input(s): CHOL, HDL, LDLCALC, TRIG, CHOLHDL, LDLDIRECT in the last 72 hours. Thyroid  function studies No results for input(s): TSH, T4TOTAL, T3FREE, THYROIDAB in the last 72 hours.  Invalid input(s): FREET3 Anemia work up No results for input(s): VITAMINB12, FOLATE, FERRITIN, TIBC, IRON, RETICCTPCT in the last 72 hours. Urinalysis    Component Value Date/Time   COLORURINE AMBER (A) 06/21/2024 1536   APPEARANCEUR HAZY (A) 06/21/2024 1536  LABSPEC 1.025 06/21/2024 1536   PHURINE 5.0 06/21/2024 1536   GLUCOSEU NEGATIVE 06/21/2024 1536   HGBUR NEGATIVE 06/21/2024 1536   BILIRUBINUR NEGATIVE 06/21/2024 1536   KETONESUR NEGATIVE 06/21/2024 1536   PROTEINUR NEGATIVE 06/21/2024 1536   UROBILINOGEN 0.2 10/04/2011 1026   NITRITE NEGATIVE 06/21/2024 1536   LEUKOCYTESUR NEGATIVE 06/21/2024 1536   Sepsis Labs Recent Labs  Lab 06/23/24 0402 06/24/24 0350 06/25/24 0350 06/26/24 0408  WBC 12.2* 12.1* 12.8* 10.8*   Microbiology Recent Results (from the past 240 hours)  Resp panel by RT-PCR (RSV, Flu A&B, Covid) Anterior Nasal Swab     Status: None   Collection Time: 06/21/24 11:57 AM   Specimen: Anterior Nasal Swab  Result Value Ref Range Status   SARS Coronavirus 2 by RT PCR  NEGATIVE NEGATIVE Final    Comment: (NOTE) SARS-CoV-2 target nucleic acids are NOT DETECTED.  The SARS-CoV-2 RNA is generally detectable in upper respiratory specimens during the acute phase of infection. The lowest concentration of SARS-CoV-2 viral copies this assay can detect is 138 copies/mL. A negative result does not preclude SARS-Cov-2 infection and should not be used as the sole basis for treatment or other patient management decisions. A negative result may occur with  improper specimen collection/handling, submission of specimen other than nasopharyngeal swab, presence of viral mutation(s) within the areas targeted by this assay, and inadequate number of viral copies(<138 copies/mL). A negative result must be combined with clinical observations, patient history, and epidemiological information. The expected result is Negative.  Fact Sheet for Patients:  BloggerCourse.com  Fact Sheet for Healthcare Providers:  SeriousBroker.it  This test is no t yet approved or cleared by the [place]  FDA and  has been authorized for detection and/or diagnosis of SARS-CoV-2 by FDA under an Emergency Use Authorization (EUA). This EUA will remain  in effect (meaning this test can be used) for the duration of the COVID-19 declaration under Section 564(b)(1) of the Act, 21 U.S.C.section 360bbb-3(b)(1), unless the authorization is terminated  or revoked sooner.       Influenza A by PCR NEGATIVE NEGATIVE Final   Influenza B by PCR NEGATIVE NEGATIVE Final    Comment: (NOTE) The Xpert Xpress SARS-CoV-2/FLU/RSV plus assay is intended as an aid in the diagnosis of influenza from Nasopharyngeal swab specimens and should not be used as a sole basis for treatment. Nasal washings and aspirates are unacceptable for Xpert Xpress SARS-CoV-2/FLU/RSV testing.  Fact Sheet for Patients: BloggerCourse.com  Fact Sheet for  Healthcare Providers: SeriousBroker.it  This test is not yet approved or cleared by the [place]  FDA and has been authorized for detection and/or diagnosis of SARS-CoV-2 by FDA under an Emergency Use Authorization (EUA). This EUA will remain in effect (meaning this test can be used) for the duration of the COVID-19 declaration under Section 564(b)(1) of the Act, 21 U.S.C. section 360bbb-3(b)(1), unless the authorization is terminated or revoked.     Resp Syncytial Virus by PCR NEGATIVE NEGATIVE Final    Comment: (NOTE) Fact Sheet for Patients: BloggerCourse.com  Fact Sheet for Healthcare Providers: SeriousBroker.it  This test is not yet approved or cleared by the [place]  FDA and has been authorized for detection and/or diagnosis of SARS-CoV-2 by FDA under an Emergency Use Authorization (EUA). This EUA will remain in effect (meaning this test can be used) for the duration of the COVID-19 declaration under Section 564(b)(1) of the Act, 21 U.S.C. section 360bbb-3(b)(1), unless the authorization is terminated or revoked.  Performed at [facility]  [facility], [address] [place], [place] [address]    Time coordinating discharge:  40 mins   SIGNED:  Afton Louder, MD  [facility] 06/26/2024, 10:30 AM How to contact the [facility] Attending or Consulting provider 7A - 7P or covering provider during after hours 7P -7A, for this patient?  Check the care team in [facility] and look for a) attending/consulting TRH provider listed and b) the TRH team listed Log into [email or web address] and use [place]'s universal password to access. If you do not have the password, please contact the hospital operator. Locate the TRH provider you are looking for under [facility] and page to a number that you can be directly reached. If you still have difficulty reaching the provider, please page the [facility] (Director on Call) for  the Hospitalists listed on amion for assistance.

## 2024-06-26 NOTE — TOC Transition Note (Signed)
 Transition of Care [facility]) - Discharge Note   Patient Details  Name: Randall Sullivan MRN: [id number] Date of Birth: 11/12/1950  [facility]) CM/SW Contact:  Lucie Lunger, LCSWA Phone Number: 06/26/2024, 9:52 AM   Clinical Narrative:    CSW updated that pt will D/C home today. CSW updated Selinda with [facility] that orders have been placed and pt will return home today, they will follow up with pt in community. CSW confirmed MD placed [facility] PT/RN orders. Agency info added to AVS for pt to review at D/C. TOC signing off.   Final next level of care: Home w Home Health Services Barriers to Discharge: Barriers Resolved   Patient Goals and CMS Choice Patient states their goals for this hospitalization and ongoing recovery are:: return home with [facility] CMS Medicare.gov Compare Post Acute Care list provided to:: Patient Choice offered to / list presented to : Patient      Discharge Placement                       Discharge Plan and Services Additional resources added to the After Visit Summary for   In-house Referral: Clinical Social Work Discharge Planning Services: CM Consult Post Acute Care Choice: Home Health                    HH Arranged: PT, RN [facility] Agency: [facility] Date HH Agency Contacted: 06/26/24   Representative spoke with at [facility] Agency: Selinda  Social Drivers of Health (SDOH) Interventions SDOH Screenings   Food Insecurity: No Food Insecurity (06/21/2024)  Housing: Low Risk  (06/21/2024)  Transportation Needs: No Transportation Needs (06/21/2024)  Utilities: Not At Risk (06/21/2024)  Depression (PHQ2-9): Low Risk  (05/25/2024)  Financial Resource Strain: Medium Risk (05/10/2024)  Physical Activity: Inactive (05/10/2024)  Social Connections: Socially Isolated (06/21/2024)  Stress: No Stress Concern Present (05/10/2024)  Tobacco Use: Medium Risk (06/21/2024)     Readmission Risk Interventions    06/25/2024    9:38 AM 06/23/2024     1:25 PM 06/22/2024   10:41 AM  Readmission Risk Prevention Plan  Transportation Screening Complete Complete Complete  HRI or Home Care Consult Complete Complete Complete  Social Work Consult for Recovery Care Planning/Counseling Complete Complete Complete  Palliative Care Screening Not Applicable Not Applicable Not Applicable  Medication Review Oceanographer) Complete Complete Complete

## 2024-06-26 NOTE — Plan of Care (Signed)
  Problem: Education: Goal: Knowledge of General Education information will improve Description: Including pain rating scale, medication(s)/side effects and non-pharmacologic comfort measures 06/26/2024 1136 by Delores Kirsch, RN Outcome: Adequate for Discharge 06/26/2024 1135 by Delores Kirsch, RN Outcome: Progressing   Problem: Health Behavior/Discharge Planning: Goal: Ability to manage health-related needs will improve 06/26/2024 1136 by Delores Kirsch, RN Outcome: Adequate for Discharge 06/26/2024 1135 by Delores Kirsch, RN Outcome: Progressing   Problem: Clinical Measurements: Goal: Ability to maintain clinical measurements within normal limits will improve 06/26/2024 1136 by Delores Kirsch, RN Outcome: Adequate for Discharge 06/26/2024 1135 by Delores Kirsch, RN Outcome: Progressing Goal: Will remain free from infection Outcome: Adequate for Discharge Goal: Diagnostic test results will improve Outcome: Adequate for Discharge Goal: Respiratory complications will improve Outcome: Adequate for Discharge Goal: Cardiovascular complication will be avoided Outcome: Adequate for Discharge   Problem: Activity: Goal: Risk for activity intolerance will decrease Outcome: Adequate for Discharge   Problem: Nutrition: Goal: Adequate nutrition will be maintained Outcome: Adequate for Discharge   Problem: Coping: Goal: Level of anxiety will decrease Outcome: Adequate for Discharge   Problem: Coping: Goal: Level of anxiety will decrease Outcome: Adequate for Discharge   Problem: Elimination: Goal: Will not experience complications related to bowel motility Outcome: Adequate for Discharge Goal: Will not experience complications related to urinary retention Outcome: Adequate for Discharge   Problem: Pain Managment: Goal: General experience of comfort will improve and/or be controlled Outcome: Adequate for Discharge   Problem: Safety: Goal: Ability to remain free from injury will  improve Outcome: Adequate for Discharge   Problem: Skin Integrity: Goal: Risk for impaired skin integrity will decrease Outcome: Adequate for Discharge   Problem: Skin Integrity: Goal: Risk for impaired skin integrity will decrease Outcome: Adequate for Discharge   Problem: Education: Goal: Ability to describe self-care measures that may prevent or decrease complications (Diabetes Survival Skills Education) will improve Outcome: Adequate for Discharge Goal: Individualized Educational Video(s) Outcome: Adequate for Discharge   Problem: Coping: Goal: Ability to adjust to condition or change in health will improve Outcome: Adequate for Discharge   Problem: Fluid Volume: Goal: Ability to maintain a balanced intake and output will improve Outcome: Adequate for Discharge   Problem: Health Behavior/Discharge Planning: Goal: Ability to identify and utilize available resources and services will improve Outcome: Adequate for Discharge Goal: Ability to manage health-related needs will improve Outcome: Adequate for Discharge   Problem: Metabolic: Goal: Ability to maintain appropriate glucose levels will improve Outcome: Adequate for Discharge   Problem: Nutritional: Goal: Maintenance of adequate nutrition will improve Outcome: Adequate for Discharge Goal: Progress toward achieving an optimal weight will improve Outcome: Adequate for Discharge   Problem: Skin Integrity: Goal: Risk for impaired skin integrity will decrease Outcome: Adequate for Discharge   Problem: Tissue Perfusion: Goal: Adequacy of tissue perfusion will improve Outcome: Adequate for Discharge

## 2024-06-26 NOTE — Progress Notes (Signed)
 Physical Therapy Treatment Patient Details Name: Randall Sullivan MRN: [id number] DOB: [birth year] Today's Date: 06/26/2024   History of Present Illness Randall Sullivan is a 73 year old with history of A-fib, on Coumadin , COPD,   AAA, lung cancer, HTN, HLD, PVD, prediabetic .[name]  Presenting to ED with chief complaint of shortness of breath, cough, and palpitation. Patient reports his symptoms has been progressively worsening in the past few days.  Denies any chest pain, denies any fever or chills, nausea or vomiting.  No excessive swelling in extremities    PT Comments  Patient agreeable for therapy. Patient demonstrates good return for bed mobility and transferring to chair, tolerated ambulation in room, hallway pushing IV pole and ambulating without AD with no loss of balance, but limited mostly due to c/o BLE weakness. Patient on 3 LPM during ambulation with SpO2 dropping from 93% to 87%, with HR up to 87 bpm and tolerated sitting up in chair after therapy with SpO2 increasing to 93% and HR at 71 resting in chair with his spouse present - RN notified. Patient will benefit from continued skilled physical therapy in hospital and recommended venue below to increase strength, balance, endurance for safe ADLs and gait.    If plan is discharge home, recommend the following: A little help with walking and/or transfers;Assist for transportation;Help with stairs or ramp for entrance;A little help with bathing/dressing/bathroom   Can travel by private vehicle        Equipment Recommendations  None recommended by PT    Recommendations for Other Services       Precautions / Restrictions Precautions Precautions: Fall Recall of Precautions/Restrictions: Intact Restrictions Weight Bearing Restrictions Per Provider Order: No     Mobility  Bed Mobility Overal bed mobility: Independent                  Transfers Overall transfer level: Modified independent                 General transfer  comment: slightly labored movement    Ambulation/Gait Ambulation/Gait assistance: Supervision Gait Distance (Feet): 50 Feet Assistive device: IV Pole, None Gait Pattern/deviations: Decreased step length - right, Decreased step length - left, Decreased stride length Gait velocity: decreased     General Gait Details: slightly labored movement without loss of balance pushing IV pole and ambulating without AD, limited mostly due to c/o weakness in legs, on 3 LPM O2 with SpO2 dropping from 93% to 87%, HR up to 87 bpm   Stairs             Wheelchair Mobility     Tilt Bed    Modified Rankin (Stroke Patients Only)       Balance Overall balance assessment: Needs assistance Sitting-balance support: Feet supported, No upper extremity supported Sitting balance-Leahy Scale: Good Sitting balance - Comments: seated at EOB   Standing balance support: During functional activity, No upper extremity supported Standing balance-Leahy Scale: Fair Standing balance comment: fair/good  without AD                            Communication Communication Communication: No apparent difficulties  Cognition Arousal: Alert Behavior During Therapy: WFL for tasks assessed/performed   PT - Cognitive impairments: No apparent impairments                         Following commands: Intact      Cueing Cueing Techniques:  Verbal cues  Exercises      General Comments        Pertinent Vitals/Pain Pain Assessment Pain Assessment: No/denies pain    Home Living                          Prior Function            PT Goals (current goals can now be found in the care plan section) Acute Rehab PT Goals Patient Stated Goal: Pt wants to return home. PT Goal Formulation: With patient/family Time For Goal Achievement: 06/28/24 Potential to Achieve Goals: Good Progress towards PT goals: Progressing toward goals    Frequency    Min 3X/week      PT Plan       Co-evaluation              AM-PAC PT 6 Clicks Mobility   Outcome Measure  Help needed turning from your back to your side while in a flat bed without using bedrails?: None Help needed moving from lying on your back to sitting on the side of a flat bed without using bedrails?: None Help needed moving to and from a bed to a chair (including a wheelchair)?: None Help needed standing up from a chair using your arms (e.g., wheelchair or bedside chair)?: None Help needed to walk in hospital room?: A Little Help needed climbing 3-5 steps with a railing? : A Little 6 Click Score: 22    End of Session Equipment Utilized During Treatment: Oxygen  Activity Tolerance: Patient tolerated treatment well;Patient limited by fatigue Patient left: in chair;with call bell/phone within reach;with family/visitor present Nurse Communication: Mobility status PT Visit Diagnosis: Unsteadiness on feet (R26.81);Muscle weakness (generalized) (M62.81);Difficulty in walking, not elsewhere classified (R26.2)     Time: 9051-8987 PT Time Calculation (min) (ACUTE ONLY): 24 min  Charges:    $Gait Training: 8-22 mins $Therapeutic Activity: 8-22 mins PT General Charges $$ ACUTE PT VISIT: 1 Visit                     11:33 AM, 06/26/24 Lynwood Music, MPT Physical Therapist with [facility] [phone] office [phone] mobile phone

## 2024-06-26 NOTE — Plan of Care (Signed)
   Problem: Education: Goal: Knowledge of General Education information will improve Description Including pain rating scale, medication(s)/side effects and non-pharmacologic comfort measures Outcome: Progressing   Problem: Health Behavior/Discharge Planning: Goal: Ability to manage health-related needs will improve Outcome: Progressing

## 2024-06-26 NOTE — Plan of Care (Signed)
  Problem: Health Behavior/Discharge Planning: Goal: Ability to manage health-related needs will improve Outcome: Progressing   Problem: Clinical Measurements: Goal: Will remain free from infection Outcome: Progressing Goal: Respiratory complications will improve Outcome: Progressing   Problem: Activity: Goal: Risk for activity intolerance will decrease Outcome: Progressing   Problem: Elimination: Goal: Will not experience complications related to urinary retention Outcome: Progressing   Problem: Pain Managment: Goal: General experience of comfort will improve and/or be controlled Outcome: Progressing   Problem: Safety: Goal: Ability to remain free from injury will improve Outcome: Progressing   Problem: Skin Integrity: Goal: Risk for impaired skin integrity will decrease Outcome: Progressing   Problem: Health Behavior/Discharge Planning: Goal: Ability to manage health-related needs will improve Outcome: Progressing   Problem: Tissue Perfusion: Goal: Adequacy of tissue perfusion will improve Outcome: Progressing

## 2024-06-28 ENCOUNTER — Encounter: Payer: Self-pay | Admitting: Internal Medicine

## 2024-06-28 ENCOUNTER — Telehealth: Payer: Self-pay | Admitting: Cardiology

## 2024-06-28 ENCOUNTER — Telehealth: Payer: Self-pay | Admitting: *Deleted

## 2024-06-28 ENCOUNTER — Telehealth: Payer: Self-pay

## 2024-06-28 DIAGNOSIS — I714 Abdominal aortic aneurysm, without rupture, unspecified: Secondary | ICD-10-CM | POA: Diagnosis not present

## 2024-06-28 DIAGNOSIS — J189 Pneumonia, unspecified organism: Secondary | ICD-10-CM | POA: Diagnosis not present

## 2024-06-28 DIAGNOSIS — G4700 Insomnia, unspecified: Secondary | ICD-10-CM | POA: Diagnosis not present

## 2024-06-28 DIAGNOSIS — J441 Chronic obstructive pulmonary disease with (acute) exacerbation: Secondary | ICD-10-CM | POA: Diagnosis not present

## 2024-06-28 DIAGNOSIS — I10 Essential (primary) hypertension: Secondary | ICD-10-CM | POA: Diagnosis not present

## 2024-06-28 DIAGNOSIS — J9620 Acute and chronic respiratory failure, unspecified whether with hypoxia or hypercapnia: Secondary | ICD-10-CM | POA: Diagnosis not present

## 2024-06-28 DIAGNOSIS — E1169 Type 2 diabetes mellitus with other specified complication: Secondary | ICD-10-CM | POA: Diagnosis not present

## 2024-06-28 DIAGNOSIS — C3490 Malignant neoplasm of unspecified part of unspecified bronchus or lung: Secondary | ICD-10-CM | POA: Diagnosis not present

## 2024-06-28 DIAGNOSIS — E785 Hyperlipidemia, unspecified: Secondary | ICD-10-CM | POA: Diagnosis not present

## 2024-06-28 DIAGNOSIS — N401 Enlarged prostate with lower urinary tract symptoms: Secondary | ICD-10-CM | POA: Diagnosis not present

## 2024-06-28 DIAGNOSIS — E1151 Type 2 diabetes mellitus with diabetic peripheral angiopathy without gangrene: Secondary | ICD-10-CM | POA: Diagnosis not present

## 2024-06-28 DIAGNOSIS — I4892 Unspecified atrial flutter: Secondary | ICD-10-CM | POA: Diagnosis not present

## 2024-06-28 DIAGNOSIS — J440 Chronic obstructive pulmonary disease with acute lower respiratory infection: Secondary | ICD-10-CM | POA: Diagnosis not present

## 2024-06-28 DIAGNOSIS — I4891 Unspecified atrial fibrillation: Secondary | ICD-10-CM | POA: Diagnosis not present

## 2024-06-28 DIAGNOSIS — Z9981 Dependence on supplemental oxygen: Secondary | ICD-10-CM | POA: Diagnosis not present

## 2024-06-28 DIAGNOSIS — I69351 Hemiplegia and hemiparesis following cerebral infarction affecting right dominant side: Secondary | ICD-10-CM | POA: Diagnosis not present

## 2024-06-28 DIAGNOSIS — J439 Emphysema, unspecified: Secondary | ICD-10-CM | POA: Diagnosis not present

## 2024-06-28 DIAGNOSIS — E1142 Type 2 diabetes mellitus with diabetic polyneuropathy: Secondary | ICD-10-CM | POA: Diagnosis not present

## 2024-06-28 DIAGNOSIS — Z7901 Long term (current) use of anticoagulants: Secondary | ICD-10-CM | POA: Diagnosis not present

## 2024-06-28 DIAGNOSIS — Z902 Acquired absence of lung [part of]: Secondary | ICD-10-CM | POA: Diagnosis not present

## 2024-06-28 DIAGNOSIS — R351 Nocturia: Secondary | ICD-10-CM | POA: Diagnosis not present

## 2024-06-28 DIAGNOSIS — I69311 Memory deficit following cerebral infarction: Secondary | ICD-10-CM | POA: Diagnosis not present

## 2024-06-28 NOTE — Telephone Encounter (Signed)
 Spoke with [name] Proud RN [facility].SABRA  Pt D/C from hospital om 9/27.  D/C INR was 3.6.  Taking prednisone  40mg  daily x 5 days.  Restart warfarin 2.5mg  daily except 5mg  on M,W,F and recheck INR on 07/05/24 by home health.

## 2024-06-28 NOTE — Telephone Encounter (Signed)
 [name], RN called in stating pt was recently discharged from hospital and ask when does he need INR f/u again. Please advise.

## 2024-06-28 NOTE — Transitions of Care (Post Inpatient/ED Visit) (Signed)
   06/28/2024  Name: Randall Sullivan MRN: [id number] DOB: 1950-12-18  Today's TOC FU Call Status: Today's TOC FU Call Status:: Unsuccessful Call (1st Attempt) Unsuccessful Call (1st Attempt) Date: 06/28/24  Attempted to reach the patient regarding the most recent Inpatient/ED visit.  Follow Up Plan: Additional outreach attempts will be made to reach the patient to complete the Transitions of Care (Post Inpatient/ED visit) call.   Cathlean Headland BSN RN [facility] Health Care Management Coordinator [email or web address] Direct Dial: [phone]  Fax: [phone] Website: [email or web address]

## 2024-06-28 NOTE — Telephone Encounter (Signed)
 Copied from CRM [id number]. Topic: General - Other >> Jun 28, 2024  1:00 PM Jasmin G wrote: Reason for CRM: Ms. [name] from [facility] requested a phone call from one fo Ms. Armanda nurses to discuss questions about recent meds prescribed when pt was  discharged on 27th. Call back at [phone].

## 2024-06-28 NOTE — Telephone Encounter (Signed)
 Not our pt

## 2024-06-29 ENCOUNTER — Telehealth: Payer: Self-pay

## 2024-06-29 NOTE — Transitions of Care (Post Inpatient/ED Visit) (Signed)
 06/29/2024  Name: Randall Sullivan MRN: [id number] DOB: [birth year]  Today's TOC FU Call Status: Today's TOC FU Call Status:: Successful TOC FU Call Completed TOC FU Call Complete Date: 06/29/24 Patient's Name and Date of Birth confirmed.  Transition Care Management Follow-up Telephone Call Date of Discharge: 06/26/24 [facility] Type of Discharge: Inpatient Admission Primary Inpatient Discharge Diagnosis:: Atrial Fibrillation with RVR How have you been since you were released from the hospital?: Better Any questions or concerns?: No  Items Reviewed: Did you receive and understand the discharge instructions provided?: Yes Medications obtained,verified, and reconciled?: Yes (Medications Reviewed) Any new allergies since your discharge?: No Dietary orders reviewed?: Yes Type of Diet Ordered:: Heart Healthy low sodium Do you have support at home?: Yes People in Home [RPT]: spouse Name of Support/Comfort Primary Source: [name]  Medications Reviewed Today: Medications Reviewed Today     Reviewed by Eilleen Richerd GRADE, RN (Registered Nurse) on 06/29/24 at 1442  Med List Status: <None>   Medication Order Taking? Sig Documenting Provider Last Dose Status Informant  albuterol  (VENTOLIN  HFA) 108 (90 Base) MCG/ACT inhaler [id number]  Inhale 2 puffs into the lungs every 4 (four) hours as needed for wheezing or shortness of breath. Johnson, Clanford L, MD  Active   cetirizine  (ZYRTEC ) 10 MG tablet [id number]  Take 10 mg by mouth daily. [provider]  Active Spouse/Significant Other, Self, Pharmacy Records  diltiazem  (CARDIZEM  CD) 120 MG 24 hr capsule [id number]  Take 1 capsule (120 mg total) by mouth daily. Willette Adriana LABOR, MD  Active   fenofibrate  (TRICOR ) 145 MG tablet [id number]  Take 1 tablet (145 mg total) by mouth daily. Tobie Suzzane POUR, MD  Active Self, Spouse/Significant Other, Pharmacy Records  finasteride  (PROSCAR ) 5 MG tablet [id number]  Take 1 tablet (5  mg total) by mouth daily. Tobie Suzzane POUR, MD  Active Self, Spouse/Significant Other, Pharmacy Records  guaiFENesin -dextromethorphan  [facility] DM) 100-10 MG/5ML syrup [id number]  Take 10 mLs by mouth every 8 (eight) hours as needed for cough. Johnson, Clanford L, MD  Active   hydrOXYzine  (ATARAX ) 25 MG tablet [id number]  Take 1 tablet (25 mg total) by mouth every 8 (eight) hours as needed for itching. Tobie Suzzane POUR, MD  Active Self, Spouse/Significant Other, Pharmacy Records           Med Note PATTRICIA, CYNTHIA M   Mon Jun 21, 2024  2:06 PM) prn  magnesium  oxide (MAG-OX) 400 (240 Mg) MG tablet [id number]  Take 1 tablet (400 mg total) by mouth 2 (two) times daily. Rogers Hai, MD  Active Self, Spouse/Significant Other, Pharmacy Records           Med Note PATTRICIA, CYNTHIA M   Mon Jun 21, 2024  2:07 PM) Pt. Takes once a day  metoprolol  succinate (TOPROL  XL) 100 MG 24 hr tablet [id number]  Take 1 tablet (100 mg total) by mouth daily. Take with or immediately following a meal. Willette Adriana LABOR, MD  Active   OXYGEN  [id number]  Inhale 2 L into the lungs continuous. Hx of lung ca, copd, and emphysema [provider]  Active Spouse/Significant Other, Self, Pharmacy Records  Palonosetron  HCl (ALOXI  IV) [id number]  Inject into the vein. [provider]  Active Self, Spouse/Significant Other, Pharmacy Records  predniSONE  (DELTASONE ) 20 MG tablet [id number]  Take 2 tablets (40 mg total) by mouth daily with breakfast for 5 days. Vicci Afton CROME, MD  Active   rosuvastatin  (CRESTOR ) 40 MG tablet [id number]  Take  1 tablet (40 mg total) by mouth daily. Lavona Agent, MD  Active Spouse/Significant Other, Self, Pharmacy Records           Med Note ALDONA DAWNA SAILOR   Tue Dec 09, 2023  2:19 PM)    tamsulosin  (FLOMAX ) 0.4 MG CAPS capsule [id number]  Take 2 capsules (0.8 mg total) by mouth daily. Tobie Suzzane POUR, MD  Active Self, Spouse/Significant Other, Pharmacy Records  warfarin (COUMADIN ) 5  MG tablet [id number]  Take 0.5-1 tablets (2.5-5 mg total) by mouth See admin instructions. Take 2.5 mg by mouth Tuesday and Friday and take 5 mg on Monday, Wednesday, Thursday, Saturday and Sunday Vicci Afton CROME, MD  Active             Home Care and Equipment/Supplies: Were Home Health Services Ordered?: Yes Name of Home Health Agency:: [facility] Has Agency set up a time to come to your home?: Yes First Home Health Visit Date: 06/28/24 Any new equipment or medical supplies ordered?: No  Functional Questionnaire: Do you need assistance with bathing/showering or dressing?: No Do you need assistance with meal preparation?: No Do you need assistance with eating?: No Do you have difficulty maintaining continence: No Do you need assistance with getting out of bed/getting out of a chair/moving?: No Do you have difficulty managing or taking your medications?: Yes (wife manages his medications)  Follow up appointments reviewed: PCP Follow-up appointment confirmed?: No (Offered hospital follow up with PCP. Wife states she prefers to have the follow up with Cardiology and Oncology for now) Specialist Hospital Follow-up appointment confirmed?: Yes Date of Specialist follow-up appointment?: 07/14/24 Follow-Up Specialty Provider:: Cardiology - [name] Do you need transportation to your follow-up appointment?: No Do you understand care options if your condition(s) worsen?: Yes-patient verbalized understanding  *Currently declines 30 day TOC program.  Richerd Fish, RN, BSN, CCM [facility], [facility] RN Care Manager Direct Dial: [phone]

## 2024-06-30 ENCOUNTER — Other Ambulatory Visit: Payer: Self-pay

## 2024-06-30 ENCOUNTER — Encounter: Payer: Self-pay | Admitting: Internal Medicine

## 2024-06-30 DIAGNOSIS — E785 Hyperlipidemia, unspecified: Secondary | ICD-10-CM

## 2024-06-30 MED ORDER — FENOFIBRATE 145 MG PO TABS: 145.0000 mg | ORAL_TABLET | Freq: Every day | ORAL | 1 refills | Status: DC

## 2024-07-01 DIAGNOSIS — I4891 Unspecified atrial fibrillation: Secondary | ICD-10-CM | POA: Diagnosis not present

## 2024-07-01 DIAGNOSIS — J440 Chronic obstructive pulmonary disease with acute lower respiratory infection: Secondary | ICD-10-CM | POA: Diagnosis not present

## 2024-07-01 DIAGNOSIS — I714 Abdominal aortic aneurysm, without rupture, unspecified: Secondary | ICD-10-CM | POA: Diagnosis not present

## 2024-07-01 DIAGNOSIS — J189 Pneumonia, unspecified organism: Secondary | ICD-10-CM | POA: Diagnosis not present

## 2024-07-01 DIAGNOSIS — E1151 Type 2 diabetes mellitus with diabetic peripheral angiopathy without gangrene: Secondary | ICD-10-CM | POA: Diagnosis not present

## 2024-07-01 DIAGNOSIS — E1142 Type 2 diabetes mellitus with diabetic polyneuropathy: Secondary | ICD-10-CM | POA: Diagnosis not present

## 2024-07-02 ENCOUNTER — Other Ambulatory Visit: Payer: Self-pay

## 2024-07-02 ENCOUNTER — Encounter: Payer: Self-pay | Admitting: Internal Medicine

## 2024-07-02 DIAGNOSIS — N401 Enlarged prostate with lower urinary tract symptoms: Secondary | ICD-10-CM

## 2024-07-02 MED ORDER — FINASTERIDE 5 MG PO TABS: 5.0000 mg | ORAL_TABLET | Freq: Every day | ORAL | 0 refills | Status: DC

## 2024-07-05 ENCOUNTER — Ambulatory Visit (INDEPENDENT_AMBULATORY_CARE_PROVIDER_SITE_OTHER): Payer: Self-pay | Admitting: *Deleted

## 2024-07-05 DIAGNOSIS — I4891 Unspecified atrial fibrillation: Secondary | ICD-10-CM

## 2024-07-05 DIAGNOSIS — Z5181 Encounter for therapeutic drug level monitoring: Secondary | ICD-10-CM

## 2024-07-05 DIAGNOSIS — E1142 Type 2 diabetes mellitus with diabetic polyneuropathy: Secondary | ICD-10-CM | POA: Diagnosis not present

## 2024-07-05 DIAGNOSIS — E1151 Type 2 diabetes mellitus with diabetic peripheral angiopathy without gangrene: Secondary | ICD-10-CM | POA: Diagnosis not present

## 2024-07-05 DIAGNOSIS — J440 Chronic obstructive pulmonary disease with acute lower respiratory infection: Secondary | ICD-10-CM | POA: Diagnosis not present

## 2024-07-05 DIAGNOSIS — I714 Abdominal aortic aneurysm, without rupture, unspecified: Secondary | ICD-10-CM | POA: Diagnosis not present

## 2024-07-05 DIAGNOSIS — J189 Pneumonia, unspecified organism: Secondary | ICD-10-CM | POA: Diagnosis not present

## 2024-07-05 LAB — POCT INR: INR: 3.1 — AB (ref 2.0–3.0)

## 2024-07-05 NOTE — Progress Notes (Signed)
 INR 3.1  Please see anticoagulation encounter

## 2024-07-05 NOTE — Patient Instructions (Signed)
 Take warfarin 1/2 tablet tonight then resume 1/2 tablet daily except 1 tablet on Mondays, Wednesdays and Fridays.  Continue greens/salads  Recheck INR in 1 week Order given to [name] Proud RN [facility]

## 2024-07-06 DIAGNOSIS — I4891 Unspecified atrial fibrillation: Secondary | ICD-10-CM | POA: Diagnosis not present

## 2024-07-06 DIAGNOSIS — E1142 Type 2 diabetes mellitus with diabetic polyneuropathy: Secondary | ICD-10-CM | POA: Diagnosis not present

## 2024-07-06 DIAGNOSIS — E1151 Type 2 diabetes mellitus with diabetic peripheral angiopathy without gangrene: Secondary | ICD-10-CM | POA: Diagnosis not present

## 2024-07-06 DIAGNOSIS — J189 Pneumonia, unspecified organism: Secondary | ICD-10-CM | POA: Diagnosis not present

## 2024-07-06 DIAGNOSIS — J440 Chronic obstructive pulmonary disease with acute lower respiratory infection: Secondary | ICD-10-CM | POA: Diagnosis not present

## 2024-07-06 DIAGNOSIS — I714 Abdominal aortic aneurysm, without rupture, unspecified: Secondary | ICD-10-CM | POA: Diagnosis not present

## 2024-07-07 NOTE — Progress Notes (Signed)
 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  Currently on consolidation durvalumab .   - Patient was recently admitted to the hospital for A-fib and was found to have community-acquired pneumonia.  Treated with antibiotics and steroids.  Significantly improved and is currently back at baseline of 2 L oxygen  via nasal cannula -As patient is back to baseline and is off steroids, will continue durvalumab  1500 mg every 4 weeks - Labs reviewed today: CMP: Stable, CBC: Mild anemia but improved, TSH: Normal -Labs and physical exam stable today.  Will continue with treatment today. - Last CT scan was stable.  Repeat CT chest in 3 months. Due 07/2024. Will obtain prior to next visit  Return to clinic in 4 weeks with labs and CT scan for follow-up.   Chronic obstructive pulmonary disease (COPD) Well-managed with oxygen  therapy, maintaining 100% oxygen  saturation on two liters.  - Continue oxygen  therapy at two liters. - Monitor for worsening respiratory symptoms.  Community-acquired pneumonia Admitted to the hospital for A-fib and was found to have community-acquired pneumonia.  Chest x-ray with findings consistent with pneumonia Improved with antibiotics and steroids Currently on 2 L via nasal cannula which is his baseline  -Continue to  monitor for now  Skin rash Patient previously developed skin rash on lateral chest wall, groin and upper thighs which was treated with 5 days of prednisone  30 mg daily Resolved at this time   - Recommended to continue hydrocortisone  cream as needed -Recommended to use Benadryl  topical cream and also Benadryl  oral 25 mg at night if the itching persists as needed -Monitor for now  Atrial fibrillation Exacerbated by pneumonia. On Coumadin  with stable INR of 3.1. Cardiology appointment scheduled.  - Continue Coumadin  therapy. - Regularly monitor INR. - Attend cardiology appointment next week.  Peripheral neuropathy, feet Present but not severe.  Stable   The patient understands the plans discussed today and is in agreement with them.  He knows to contact our office if he develops concerns prior to his next appointment.  The total time spent in the appointment was 40 minutes for the encounter  with patient, including review of chart and various tests results, discussions about plan of care and coordination of care plan   Siad Davonna, MD  [facility] [place] - A DEPT OF [facility] [address] [place] [address] Dept: [phone] Dept Fax: [phone]   No orders of the defined types were placed in this encounter.    ONCOLOGY HISTORY:   I have reviewed his chart and materials related to his cancer extensively and collaborated history with the patient. Summary of oncologic history is as follows:  Left lung adenocarcinoma- Stage IA (T1b,N0,M0)  -06/01/2016: CT chest: 3.8 cm spiculated left upper lobe mass, suspicious for primary bronchogenic neoplasm. -06/19/2016: Initial PET: 3.3 x 1.4  cm hypermetabolic subsolid left upper lobe pulmonary nodule, compatible with primary bronchogenic neoplasm. No findings suspicious for metastatic disease. -07/03/2016: LUL lung biopsy. Pathology: Well differentiated  adenocarcinoma. Cytology positive for adenocarcinoma.  -08/01/2016:  Left VATS, left upper lobectomy with mediastinal lymph node dissection.  Pathology: Invasive well differentiated adenocarcinoma, spanning 2.5 cm. No evidence of carcinoma in 4 of 4 lymph nodes (0/4). Surgical resections negative for carcinoma.   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 Although the SUV is only 1.9, this nodule is at the lower size limits for sensitive assessment by PET-CT, and accordingly even at this lower SUV concern is raised for the possibility of malignancy. The lesion has also increased in size compared to 04/09/2017, suspicious for low-grade malignancy. -12/09/2018: Right vats with wedge resection of right upper lobe nodule and bleb resection.  (Stage Ia) Pathology: Invasive moderately differentiatred adenocarcioma, 0.7 cm, acinar predominant. Negative for visceral pleural invasion. Resection margins negative for carcinoma. Negative for lymphovascular invasion. One out of one lymph node negative for metastatic carcinoma. Benign pleural blebs, negative for carcinoma. -03/02/2019-06/11/2022: Every 3-6 months CT scans with NED -07/08/2023: CT chest: Increasing masslike soft tissue along the suture margin in the central right upper lobe, indicative of disease recurrence. Slightly enlarged spiculated nodule in the lateral right lower lobe. Additional nodules are stable. Recommend continued attention on follow-up. -08/19/2023: PET: Recurrent bronchogenic carcinoma in the medial right upper lobe, along the resection margin. Faint 6 mm nodule in the superior segment right lower lobe, too small for PET resolution. Focal hypermetabolism in the central prostate.  -10/21/2023: CT super D chest: Solid 2.6 cm central right upper lobe pulmonary nodule along the wedge resection suture line is stable to minimally decreased  since 07/08/2023 chest CT. Solid 1.3 cm and 0.7 cm peripheral right lower lobe pulmonary nodules are not substantially changed since 07/08/2023 chest CT. No new or progressive metastatic disease in the chest. No thoracic adenopathy. -10/24/2023: Right paratracheal lymph node FNA.  Cytology: Adenocarcinoma. -12/02/2023: MRI brain:  No strong evidence of cerebral metastatic disease on this noncontrast exam. -11/03/2023: Foundation 1 CDX:  Insufficient tissue -11/03/2023: PD-L1 22 C3 (TPS): 5% -12/04/2023: Foundation one blood NGS: TMB-low, MSI-high not detected, no other targetable mutations  -12/23/2023- 02/03/2024: Chemoradiation therapy with weekly carboplatin  and paclitaxel  -02/25/2024 to current: Consolidation durvalumab  every 2 weeks - 05/18/2024: CT chest with contrast: Unchanged postoperative and post treatment appearance of the chest status post right upper lobe wedge resection and left upper lobectomy.Extensive perihilar scarring and volume loss of the perihilar right lung unchanged, central, most nodular component similar at 2.2 x 1.6 cm.Unchanged right lower lobe nodules. No evidence of lymphadenopathy or metastatic disease in the chest.  Current Treatment:  Consolidation durvalumab  1500mg  every 4 weeks  INTERVAL HISTORY:   Discussed the use of AI scribe software for clinical note transcription with the patient, who gave verbal consent to proceed.  History of Present Illness BRANDUN PINN is a 73 year old male with COPD and lung cancer who presents for follow-up for lung cancer after hospitalization for pneumonia and for his infusion of Durvalumab .  He has a history of COPD and has been on oxygen  therapy since December of the previous year due to worsening COPD. Since his recent hospitalization for pneumonia, his oxygen  requirement increased to 3 liters per minute. Currently, he is back to using 2 liters per minute at home which is  his baseline.  He was hospitalized from September 22  to June 26, 2024, for pneumonia and was treated with antibiotics and steroids. Post-discharge, he continued oral steroids for five days.  His rash improved with oral steroids, and reports numbness and tingling in his feet, though not severe.   I have reviewed the past medical history, past surgical history, social history and family history with the patient and they are unchanged from previous note.  ALLERGIES:  has no known allergies.  MEDICATIONS:  Current Outpatient Medications  Medication Sig Dispense Refill   albuterol  (VENTOLIN  HFA) 108 (90 Base) MCG/ACT inhaler Inhale 2 puffs into the lungs every 4 (four) hours as needed for wheezing or shortness of breath. 8 g 2   cetirizine  (ZYRTEC ) 10 MG tablet Take 10 mg by mouth daily.     diltiazem  (CARDIZEM  CD) 120 MG 24 hr capsule Take 1 capsule (120 mg total) by mouth daily. 30 capsule 1   fenofibrate  (TRICOR ) 145 MG tablet Take 1 tablet (145 mg total) by mouth daily. 90 tablet 1   finasteride  (PROSCAR ) 5 MG tablet Take 1 tablet (5 mg total) by mouth daily. 30 tablet 0   guaiFENesin -dextromethorphan  (ROBITUSSIN DM) 100-10 MG/5ML syrup Take 10 mLs by mouth every 8 (eight) hours as needed for cough.     hydrOXYzine  (ATARAX ) 25 MG tablet Take 1 tablet (25 mg total) by mouth every 8 (eight) hours as needed for itching. 30 tablet 5   magnesium  oxide (MAG-OX) 400 (240 Mg) MG tablet Take 1 tablet (400 mg total) by mouth 2 (two) times daily. 60 tablet 3   metoprolol  succinate (TOPROL  XL) 100 MG 24 hr tablet Take 1 tablet (100 mg total) by mouth daily. Take with or immediately following a meal. 30 tablet 1   OXYGEN  Inhale 2 L into the lungs continuous. Hx of lung ca, copd, and emphysema     Palonosetron  HCl (ALOXI  IV) Inject into the vein.     rosuvastatin  (CRESTOR ) 40 MG tablet Take 1 tablet (40 mg total) by mouth daily. 90 tablet 3   tamsulosin  (FLOMAX ) 0.4 MG CAPS capsule Take 2 capsules (0.8 mg total) by mouth daily. 180 capsule 3   warfarin  (COUMADIN ) 5 MG tablet Take 0.5-1 tablets (2.5-5 mg total) by mouth See admin instructions. Take 2.5 mg by mouth Tuesday and Friday and take 5 mg on Monday, Wednesday, Thursday, Saturday and Sunday     No current facility-administered medications for this visit.    REVIEW OF SYSTEMS:   Constitutional: Denies fevers, chills or abnormal weight loss Eyes: Denies blurriness of vision Ears, nose, mouth, throat, and face: Denies mucositis or sore throat Respiratory: Denies cough, dyspnea or wheezes Cardiovascular: Denies palpitation, chest discomfort or lower extremity swelling Gastrointestinal:  Denies nausea, heartburn or change in bowel habits Skin: Denies abnormal skin rashes Lymphatics: Denies new lymphadenopathy or easy bruising Neurological:Denies numbness, tingling or new weaknesses Behavioral/Psych: Mood is stable, no new changes  All other systems were reviewed with the patient and are negative.   VITALS:  There were no vitals taken for this visit.  Wt Readings from Last 3 Encounters:  06/26/24 209 lb 7 oz (95 kg)  05/25/24 216 lb 7.9 oz (98.2 kg)  05/11/24 219 lb 3.2 oz (99.4 kg)    There is no height or weight on file to calculate BMI.  Performance status (ECOG): 2 - Symptomatic, <50% confined to bed  PHYSICAL EXAM:   GENERAL:alert, no distress and comfortable, on oxygen  via nasal cannula  SKIN: skin color, texture, turgor are normal, no rashes or significant lesions EYES: normal, Conjunctiva are pink and non-injected, sclera clear Hello LUNGS: clear to auscultation and percussion with normal breathing effort HEART: regular rate & rhythm and no murmurs and no lower extremity edema ABDOMEN:abdomen soft, non-tender and normal bowel sounds Musculoskeletal:no cyanosis of digits and no clubbing  NEURO: alert & oriented x 3 with fluent speech, no focal motor/sensory deficits  LABORATORY DATA:  I have reviewed the data as listed  Lab Results  Component Value Date   WBC  10.8 (H) 06/26/2024   NEUTROABS 5.2 05/25/2024   HGB 11.0 (L) 06/26/2024   HCT 36.3 (L) 06/26/2024   MCV 83.1 06/26/2024   PLT 208 06/26/2024      Chemistry      Component Value Date/Time   NA 140 06/26/2024 0408   NA 140 07/15/2023 1142   NA 136 04/09/2017 1249   K 4.4 06/26/2024 0408   K 4.6 04/09/2017 1249   CL 96 (L) 06/26/2024 0408   CO2 35 (H) 06/26/2024 0408   CO2 27 04/09/2017 1249   BUN 50 (H) 06/26/2024 0408   BUN 13 07/15/2023 1142   BUN 23.9 04/09/2017 1249   CREATININE 1.11 06/26/2024 0408   CREATININE 1.10 11/26/2023 1040   CREATININE 1.21 (H) 05/08/2020 1051   CREATININE 1.4 (H) 04/09/2017 1249      Component Value Date/Time   CALCIUM  8.7 (L) 06/26/2024 0408   CALCIUM  9.6 04/09/2017 1249   ALKPHOS 36 (L) 05/25/2024 0922   ALKPHOS 58 04/09/2017 1249   AST 23 05/25/2024 0922   AST 20 11/26/2023 1040   AST 13 04/09/2017 1249   ALT 18 05/25/2024 0922   ALT 19 11/26/2023 1040   ALT 9 04/09/2017 1249   BILITOT 0.9 05/25/2024 0922   BILITOT 0.7 11/26/2023 1040   BILITOT 1.01 04/09/2017 1249       Latest Reference Range & Units 07/08/24 08:11  TSH 0.350 - 4.500 uIU/mL 1.450   RADIOGRAPHIC STUDIES: I have personally reviewed the radiological images as listed and agreed with the findings in the report.  ECHOCARDIOGRAM COMPLETE    ECHOCARDIOGRAM REPORT       Patient Name:   ATTILIO ZEITLER [facility] Date of Exam: 06/24/2024 Medical Rec #:  [id number]    Height:       70.0 in Accession #:    [id number]   Weight:       210.1 lb Date of Birth:  1950/12/22    BSA:          2.131 m Patient Age:    73 years     BP:           118/72 mmHg Patient Gender: M            HR:           79 bpm. Exam Location:  [name]  Procedure: 2D Echo, Cardiac Doppler and Color Doppler (Both Spectral and Color            Flow Doppler were utilized during procedure).  Indications:    Atrial Flutter I48.92   History:        Patient has prior history of Echocardiogram examinations, most                  recent 03/13/2021. COPD, Arrythmias:Atrial Fibrillation; Risk                 Factors:Hypertension, Diabetes, Dyslipidemia and Former Smoker.  Hx of lung cancer.   Sonographer:    Aida Pizza RCS Referring Phys: [id number] DORN FALCON BRANCH  IMPRESSIONS   1. Left ventricular ejection fraction, by estimation, is 60 to 65%. The left ventricle has normal function. The left ventricle has no regional wall motion abnormalities. Left ventricular diastolic parameters were normal.  2. Right ventricular systolic function is normal. The right ventricular size is normal.  3. A small pericardial effusion is present. The pericardial effusion is circumferential.  4. The mitral valve is normal in structure. No evidence of mitral valve regurgitation. No evidence of mitral stenosis.  5. The aortic valve is tricuspid. There is mild calcification of the aortic valve. There is mild thickening of the aortic valve. Aortic valve regurgitation is not visualized. No aortic stenosis is present.  6. The inferior vena cava is normal in size with greater than 50% respiratory variability, suggesting right atrial pressure of 3 mmHg.  FINDINGS  Left Ventricle: Left ventricular ejection fraction, by estimation, is 60 to 65%. The left ventricle has normal function. The left ventricle has no regional wall motion abnormalities. The left ventricular internal cavity size was normal in size. There is  no left ventricular hypertrophy. Left ventricular diastolic parameters were normal.  Right Ventricle: The right ventricular size is normal. Right vetricular wall thickness was not well visualized. Right ventricular systolic function is normal.  Left Atrium: Left atrial size was normal in size.  Right Atrium: Right atrial size was normal in size.  Pericardium: A small pericardial effusion is present. The pericardial effusion is circumferential.  Mitral Valve: The mitral valve is normal in structure. No  evidence of mitral valve regurgitation. No evidence of mitral valve stenosis.  Tricuspid Valve: The tricuspid valve is normal in structure. Tricuspid valve regurgitation is not demonstrated. No evidence of tricuspid stenosis.  Aortic Valve: The aortic valve is tricuspid. There is mild calcification of the aortic valve. There is mild thickening of the aortic valve. There is mild aortic valve annular calcification. Aortic valve regurgitation is not visualized. No aortic stenosis  is present. Aortic valve mean gradient measures 4.0 mmHg. Aortic valve peak gradient measures 7.7 mmHg. Aortic valve area, by VTI measures 1.64 cm.  Pulmonic Valve: The pulmonic valve was not well visualized. Pulmonic valve regurgitation is not visualized. No evidence of pulmonic stenosis.  Aorta: The aortic root is normal in size and structure.  Venous: The inferior vena cava is normal in size with greater than 50% respiratory variability, suggesting right atrial pressure of 3 mmHg.  IAS/Shunts: No atrial level shunt detected by color flow Doppler.    LEFT VENTRICLE PLAX 2D LVIDd:         5.30 cm   Diastology LVIDs:         3.30 cm   LV e' medial:    8.95 cm/s LV PW:         0.90 cm   LV E/e' medial:  15.0 LV IVS:        1.00 cm   LV e' lateral:   15.70 cm/s LVOT diam:     1.60 cm   LV E/e' lateral: 8.5 LV SV:         41 LV SV Index:   19 LVOT Area:     2.01 cm    RIGHT VENTRICLE RV S prime:     11.80 cm/s TAPSE (M-mode): 2.0 cm  LEFT ATRIUM             Index  RIGHT ATRIUM           Index LA diam:        3.30 cm 1.55 cm/m   RA Area:     22.70 cm LA Vol (A2C):   61.1 ml 28.67 ml/m  RA Volume:   70.00 ml  32.84 ml/m LA Vol (A4C):   63.9 ml 29.98 ml/m LA Biplane Vol: 63.4 ml 29.74 ml/m  AORTIC VALVE AV Area (Vmax):    1.63 cm AV Area (Vmean):   1.63 cm AV Area (VTI):     1.64 cm AV Vmax:           139.00 cm/s AV Vmean:          88.200 cm/s AV VTI:            0.251 m AV Peak Grad:       7.7 mmHg AV Mean Grad:      4.0 mmHg LVOT Vmax:         113.00 cm/s LVOT Vmean:        71.600 cm/s LVOT VTI:          0.205 m LVOT/AV VTI ratio: 0.82   AORTA Ao Root diam: 3.90 cm  MITRAL VALVE MV Area (PHT): 4.89 cm     SHUNTS MV Decel Time: 155 msec     Systemic VTI:  0.20 m MV E velocity: 134.00 cm/s  Systemic Diam: 1.60 cm MV A velocity: 90.70 cm/s MV E/A ratio:  1.48  Dorn Ross MD Electronically signed by Dorn Ross MD Signature Date/Time: 06/24/2024/1:46:16 PM      Final      "

## 2024-07-08 ENCOUNTER — Inpatient Hospital Stay

## 2024-07-08 ENCOUNTER — Inpatient Hospital Stay (HOSPITAL_BASED_OUTPATIENT_CLINIC_OR_DEPARTMENT_OTHER): Admitting: Oncology

## 2024-07-08 ENCOUNTER — Inpatient Hospital Stay: Attending: Internal Medicine

## 2024-07-08 VITALS — BP 101/58 | HR 65 | Resp 18

## 2024-07-08 DIAGNOSIS — Z5112 Encounter for antineoplastic immunotherapy: Secondary | ICD-10-CM | POA: Insufficient documentation

## 2024-07-08 DIAGNOSIS — I4892 Unspecified atrial flutter: Secondary | ICD-10-CM | POA: Insufficient documentation

## 2024-07-08 DIAGNOSIS — C3490 Malignant neoplasm of unspecified part of unspecified bronchus or lung: Secondary | ICD-10-CM

## 2024-07-08 DIAGNOSIS — E1142 Type 2 diabetes mellitus with diabetic polyneuropathy: Secondary | ICD-10-CM | POA: Insufficient documentation

## 2024-07-08 DIAGNOSIS — Z9981 Dependence on supplemental oxygen: Secondary | ICD-10-CM | POA: Insufficient documentation

## 2024-07-08 DIAGNOSIS — D649 Anemia, unspecified: Secondary | ICD-10-CM | POA: Insufficient documentation

## 2024-07-08 DIAGNOSIS — I3139 Other pericardial effusion (noninflammatory): Secondary | ICD-10-CM | POA: Insufficient documentation

## 2024-07-08 DIAGNOSIS — R21 Rash and other nonspecific skin eruption: Secondary | ICD-10-CM | POA: Diagnosis not present

## 2024-07-08 DIAGNOSIS — C3411 Malignant neoplasm of upper lobe, right bronchus or lung: Secondary | ICD-10-CM | POA: Insufficient documentation

## 2024-07-08 DIAGNOSIS — Z79899 Other long term (current) drug therapy: Secondary | ICD-10-CM | POA: Diagnosis not present

## 2024-07-08 DIAGNOSIS — I4891 Unspecified atrial fibrillation: Secondary | ICD-10-CM | POA: Diagnosis not present

## 2024-07-08 DIAGNOSIS — J440 Chronic obstructive pulmonary disease with acute lower respiratory infection: Secondary | ICD-10-CM | POA: Diagnosis not present

## 2024-07-08 DIAGNOSIS — I358 Other nonrheumatic aortic valve disorders: Secondary | ICD-10-CM | POA: Insufficient documentation

## 2024-07-08 DIAGNOSIS — Z8701 Personal history of pneumonia (recurrent): Secondary | ICD-10-CM | POA: Diagnosis not present

## 2024-07-08 LAB — COMPREHENSIVE METABOLIC PANEL WITH GFR
ALT: 24 U/L (ref 0–44)
AST: 23 U/L (ref 15–41)
Albumin: 3.5 g/dL (ref 3.5–5.0)
Alkaline Phosphatase: 36 U/L — ABNORMAL LOW (ref 38–126)
Anion gap: 9 (ref 5–15)
BUN: 17 mg/dL (ref 8–23)
CO2: 27 mmol/L (ref 22–32)
Calcium: 8.7 mg/dL — ABNORMAL LOW (ref 8.9–10.3)
Chloride: 102 mmol/L (ref 98–111)
Creatinine, Ser: 0.99 mg/dL (ref 0.61–1.24)
GFR, Estimated: >60 mL/min (ref >60)
Glucose, Bld: 138 mg/dL — ABNORMAL HIGH (ref 70–99)
Potassium: 4.1 mmol/L (ref 3.5–5.1)
Sodium: 138 mmol/L (ref 135–145)
Total Bilirubin: 0.5 mg/dL (ref 0.0–1.2)
Total Protein: 5.9 g/dL — ABNORMAL LOW (ref 6.5–8.1)

## 2024-07-08 LAB — CBC WITH DIFFERENTIAL/PLATELET
Abs Immature Granulocytes: 0.04 K/uL (ref 0.00–0.07)
Basophils Absolute: 0 K/uL (ref 0.0–0.1)
Basophils Relative: 0 %
Eosinophils Absolute: 0.1 K/uL (ref 0.0–0.5)
Eosinophils Relative: 1 %
HCT: 39.7 % (ref 39.0–52.0)
Hemoglobin: 12.3 g/dL — ABNORMAL LOW (ref 13.0–17.0)
Immature Granulocytes: 1 %
Lymphocytes Relative: 7 %
Lymphs Abs: 0.5 K/uL — ABNORMAL LOW (ref 0.7–4.0)
MCH: 25.6 pg — ABNORMAL LOW (ref 26.0–34.0)
MCHC: 31 g/dL (ref 30.0–36.0)
MCV: 82.7 fL (ref 80.0–100.0)
Monocytes Absolute: 0.9 K/uL (ref 0.1–1.0)
Monocytes Relative: 12 %
Neutro Abs: 6.2 K/uL (ref 1.7–7.7)
Neutrophils Relative %: 79 %
Platelets: 157 K/uL (ref 150–400)
RBC: 4.8 MIL/uL (ref 4.22–5.81)
RDW: 16.2 % — ABNORMAL HIGH (ref 11.5–15.5)
WBC: 7.7 K/uL (ref 4.0–10.5)
nRBC: 0 % (ref 0.0–0.2)

## 2024-07-08 LAB — TSH: TSH: 1.45 u[IU]/mL (ref 0.350–4.500)

## 2024-07-08 LAB — MAGNESIUM: Magnesium: 2 mg/dL (ref 1.7–2.4)

## 2024-07-08 MED ORDER — SODIUM CHLORIDE 0.9 % IV SOLN: INTRAVENOUS | Status: DC

## 2024-07-08 MED ORDER — SODIUM CHLORIDE 0.9 % IV SOLN
1500.0000 mg | Freq: Once | INTRAVENOUS | Status: AC
  Administered 2024-07-08: 1500 mg via INTRAVENOUS
  Filled 2024-07-08: qty 30

## 2024-07-08 NOTE — Patient Instructions (Signed)

## 2024-07-08 NOTE — Patient Instructions (Signed)
 [facility] - A DEPT OF [facility]   Discharge Instructions: Thank you for choosing [facility] to provide your oncology and hematology care.  If you have a lab appointment with the Cancer Center - please note that after April 8th, 2024, all labs will be drawn in the cancer center.  You do not have to check in or register with the main entrance as you have in the past but will complete your check-in in the cancer center.  Wear comfortable clothing and clothing appropriate for easy access to any Portacath or PICC line.   We strive to give you quality time with your provider. You may need to reschedule your appointment if you arrive late (15 or more minutes).  Arriving late affects you and other patients whose appointments are after yours.  Also, if you miss three or more appointments without notifying the office, you may be dismissed from the clinic at the provider's discretion.      For prescription refill requests, have your pharmacy contact our office and allow 72 hours for refills to be completed.    Today you received the following chemotherapy and/or immunotherapy agents: Durvalumab  (Imfinzi )      To help prevent nausea and vomiting after your treatment, we encourage you to take your nausea medication as directed.  BELOW ARE SYMPTOMS THAT SHOULD BE REPORTED IMMEDIATELY: *FEVER GREATER THAN 100.4 F (38 C) OR HIGHER *CHILLS OR SWEATING *NAUSEA AND VOMITING THAT IS NOT CONTROLLED WITH YOUR NAUSEA MEDICATION *UNUSUAL SHORTNESS OF BREATH *UNUSUAL BRUISING OR BLEEDING *URINARY PROBLEMS (pain or burning when urinating, or frequent urination) *BOWEL PROBLEMS (unusual diarrhea, constipation, pain near the anus) TENDERNESS IN MOUTH AND THROAT WITH OR WITHOUT PRESENCE OF ULCERS (sore throat, sores in mouth, or a toothache) UNUSUAL RASH, SWELLING OR PAIN  UNUSUAL VAGINAL DISCHARGE OR ITCHING   Items with * indicate a potential emergency and should  be followed up as soon as possible or go to the Emergency Department if any problems should occur.  Please show the CHEMOTHERAPY ALERT CARD or IMMUNOTHERAPY ALERT CARD at check-in to the Emergency Department and triage nurse.  Should you have questions after your visit or need to cancel or reschedule your appointment, please contact [facility] [place] - A DEPT OF [facility] [phone]  and follow the prompts.  Office hours are 8:00 a.m. to 4:30 p.m. Monday - Friday. Please note that voicemails left after 4:00 p.m. may not be returned until the following business day.  We are closed weekends and major holidays. You have access to a nurse at all times for urgent questions. Please call the main number to the clinic [phone] and follow the prompts.  For any non-urgent questions, you may also contact your provider using MyChart. We now offer e-Visits for anyone 34 and older to request care online for non-urgent symptoms. For details visit [email or web address].   Also download the MyChart app! Go to the app store, search MyChart, open the app, select [place], and log in with your MyChart username and password.

## 2024-07-09 DIAGNOSIS — E1142 Type 2 diabetes mellitus with diabetic polyneuropathy: Secondary | ICD-10-CM | POA: Diagnosis not present

## 2024-07-09 DIAGNOSIS — I714 Abdominal aortic aneurysm, without rupture, unspecified: Secondary | ICD-10-CM | POA: Diagnosis not present

## 2024-07-09 DIAGNOSIS — J440 Chronic obstructive pulmonary disease with acute lower respiratory infection: Secondary | ICD-10-CM | POA: Diagnosis not present

## 2024-07-09 DIAGNOSIS — I4891 Unspecified atrial fibrillation: Secondary | ICD-10-CM | POA: Diagnosis not present

## 2024-07-09 DIAGNOSIS — E1151 Type 2 diabetes mellitus with diabetic peripheral angiopathy without gangrene: Secondary | ICD-10-CM | POA: Diagnosis not present

## 2024-07-09 DIAGNOSIS — J189 Pneumonia, unspecified organism: Secondary | ICD-10-CM | POA: Diagnosis not present

## 2024-07-09 LAB — T4: T4, Total: 8.1 ug/dL (ref 4.5–12.0)

## 2024-07-11 NOTE — Progress Notes (Unsigned)
 Cardiology Office Note:   Date:  07/14/2024  ID:  Randall Sullivan, DOB 01/07/1951, MRN [id number] PCP: Tobie Suzzane POUR, MD  [facility] Providers Cardiologist:  Lynwood Schilling, MD {  History of Present Illness:   Randall Sullivan is a 73 y.o. male who presents for evaluation of coronary calcium . I saw him in 2017.  He had resection of lung cancer then as well and had some atrial fibrillation post surgery.  He had had resection of another lung cancer this time on the other side (right) in 2022.  He had a VATS for blebectomy and wedge resection of the right upper lobe nodule.  This was a non-small cell lung cancer.  He had a large air leak postoperatively and had to have redo VATS.  He reports being in atrial fibrillation transiently but I do not see a discharge summary that corroborates this.   Previously a POET (Plain Old Exercise Treadmill) was negative for ischemia.  He was in the hospital with CVA in October 2022.  CT/MRI/MRI of the head showed acute left large PCA territory infarction.  He had left PCA occlusion at the P1 P2 junction.  Small acute right thalamic infarction.  No hemorrhage or mass-effect.  He did not receive tPA.  Carotid Dopplers demonstrated no ICA stenosis.  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  He is currently on consolidation durvalumab .   He was admitted in Sept 2025 and had pneumonia, COPD and atrial with RVR.   He did require cardioversion.  I reviewed the hospital records for this visit.  He has not noted any atrial fibrillation and his family does watch his heart rate with pulse oximetry.  He has done well since this admission.  The patient denies any new symptoms such as chest discomfort, neck or arm discomfort. There has been no new shortness of breath, PND or orthopnea. There have been no reported  palpitations, presyncope or syncope.     He is on chronic O2.   ROS: As stated in the HPI and negative for all other systems.  Studies Reviewed:    EKG:     NA  Risk Assessment/Calculations:    CHA2DS2-VASc Score = 3   This indicates a 3.2% annual risk of stroke. The patient's score is based upon: CHF History: 0 HTN History: 1 Diabetes History: 0 Stroke History: 0 Vascular Disease History: 1 Age Score: 1 Gender Score: 0     Physical Exam:   VS:  BP 90/64 (BP Location: Right Arm, Patient Position: Sitting)   Pulse (!) 48   Ht 5' 9 (1.753 m)   Wt 206 lb 9.6 oz (93.7 kg)   SpO2 99%   BMI 30.51 kg/m    Wt Readings from Last 3 Encounters:  07/14/24 206 lb 9.6 oz (93.7 kg)  07/08/24 205 lb (93 kg)  06/26/24 209 lb 7 oz (95 kg)     GEN: Well nourished, well developed in no acute distress NECK: No JVD; No carotid bruits CARDIAC: RRR, no murmurs, rubs, gallops RESPIRATORY:  Clear to auscultation without rales, wheezing or rhonchi  ABDOMEN: Soft, non-tender, non-distended EXTREMITIES:  No edema; No deformity   ASSESSMENT AND PLAN:   ELEVATED CORONARY CALCIUM :   He has had no symptoms.  He is participating in aggressive risk reduction.  No change in therapy.   HTN: His blood pressure  is actually running low.  His beta-blocker was increased when he was in the hospital.  I talked to his family about this.  He is having no symptoms.  If he has any symptomatic hypotension in the future I probably would have to back off on his beta-blocker.    DYSLIPIDEMIA: His LDL was at target at 43.  No change in therapy.  He was taken off of his fish oil and I agree with this.    ATRIAL FIB: He has not had any symptomatic recurrence.  He tolerates warfarin which is followed at home health nursing and reduced recently.  He has had no bleeding issues.  AAA:   4.7 x 4.5 in Jan 2025.  I will repeat an ultrasound in 2026 .    Follow up with me in one year.   Signed, Lynwood Schilling, MD

## 2024-07-12 ENCOUNTER — Other Ambulatory Visit: Payer: Self-pay | Admitting: Internal Medicine

## 2024-07-12 ENCOUNTER — Encounter: Payer: Self-pay | Admitting: Internal Medicine

## 2024-07-12 DIAGNOSIS — J440 Chronic obstructive pulmonary disease with acute lower respiratory infection: Secondary | ICD-10-CM | POA: Diagnosis not present

## 2024-07-12 DIAGNOSIS — I4891 Unspecified atrial fibrillation: Secondary | ICD-10-CM | POA: Diagnosis not present

## 2024-07-12 DIAGNOSIS — I714 Abdominal aortic aneurysm, without rupture, unspecified: Secondary | ICD-10-CM | POA: Diagnosis not present

## 2024-07-12 DIAGNOSIS — J189 Pneumonia, unspecified organism: Secondary | ICD-10-CM | POA: Diagnosis not present

## 2024-07-12 DIAGNOSIS — E1142 Type 2 diabetes mellitus with diabetic polyneuropathy: Secondary | ICD-10-CM | POA: Diagnosis not present

## 2024-07-12 DIAGNOSIS — E1151 Type 2 diabetes mellitus with diabetic peripheral angiopathy without gangrene: Secondary | ICD-10-CM | POA: Diagnosis not present

## 2024-07-12 MED ORDER — MAGNESIUM OXIDE -MG SUPPLEMENT 400 (240 MG) MG PO TABS: 400.0000 mg | ORAL_TABLET | Freq: Two times a day (BID) | ORAL | 5 refills | Status: AC

## 2024-07-13 ENCOUNTER — Ambulatory Visit: Payer: Self-pay | Admitting: *Deleted

## 2024-07-13 DIAGNOSIS — J189 Pneumonia, unspecified organism: Secondary | ICD-10-CM | POA: Diagnosis not present

## 2024-07-13 DIAGNOSIS — I4891 Unspecified atrial fibrillation: Secondary | ICD-10-CM | POA: Diagnosis not present

## 2024-07-13 DIAGNOSIS — Z5181 Encounter for therapeutic drug level monitoring: Secondary | ICD-10-CM

## 2024-07-13 DIAGNOSIS — E1142 Type 2 diabetes mellitus with diabetic polyneuropathy: Secondary | ICD-10-CM | POA: Diagnosis not present

## 2024-07-13 DIAGNOSIS — I714 Abdominal aortic aneurysm, without rupture, unspecified: Secondary | ICD-10-CM | POA: Diagnosis not present

## 2024-07-13 DIAGNOSIS — J440 Chronic obstructive pulmonary disease with acute lower respiratory infection: Secondary | ICD-10-CM | POA: Diagnosis not present

## 2024-07-13 DIAGNOSIS — E1151 Type 2 diabetes mellitus with diabetic peripheral angiopathy without gangrene: Secondary | ICD-10-CM | POA: Diagnosis not present

## 2024-07-13 LAB — POCT INR: INR: 3.9 — AB (ref 2.0–3.0)

## 2024-07-13 NOTE — Patient Instructions (Signed)
 Hold warfarin tonight then decrease dose to 1/2 tablet daily except 1 tablet on Mondays and Fridays.  Continue greens/salads  Recheck INR in 1 week Order given to [name] Proud RN [facility]

## 2024-07-13 NOTE — Progress Notes (Signed)
 INR 3.9. Please see anticoagulation encounter

## 2024-07-14 ENCOUNTER — Encounter: Payer: Self-pay | Admitting: Cardiology

## 2024-07-14 ENCOUNTER — Ambulatory Visit: Attending: Cardiovascular Disease | Admitting: Cardiology

## 2024-07-14 VITALS — BP 90/64 | HR 48 | Ht 69.0 in | Wt 206.6 lb

## 2024-07-14 DIAGNOSIS — I480 Paroxysmal atrial fibrillation: Secondary | ICD-10-CM | POA: Diagnosis not present

## 2024-07-14 DIAGNOSIS — R931 Abnormal findings on diagnostic imaging of heart and coronary circulation: Secondary | ICD-10-CM | POA: Insufficient documentation

## 2024-07-14 DIAGNOSIS — I10 Essential (primary) hypertension: Secondary | ICD-10-CM | POA: Insufficient documentation

## 2024-07-14 DIAGNOSIS — E785 Hyperlipidemia, unspecified: Secondary | ICD-10-CM | POA: Insufficient documentation

## 2024-07-14 NOTE — Patient Instructions (Signed)

## 2024-07-15 ENCOUNTER — Other Ambulatory Visit: Payer: Self-pay | Admitting: *Deleted

## 2024-07-15 ENCOUNTER — Telehealth: Payer: Self-pay | Admitting: *Deleted

## 2024-07-15 DIAGNOSIS — I714 Abdominal aortic aneurysm, without rupture, unspecified: Secondary | ICD-10-CM | POA: Diagnosis not present

## 2024-07-15 DIAGNOSIS — I4891 Unspecified atrial fibrillation: Secondary | ICD-10-CM | POA: Diagnosis not present

## 2024-07-15 DIAGNOSIS — J189 Pneumonia, unspecified organism: Secondary | ICD-10-CM | POA: Diagnosis not present

## 2024-07-15 DIAGNOSIS — E1151 Type 2 diabetes mellitus with diabetic peripheral angiopathy without gangrene: Secondary | ICD-10-CM | POA: Diagnosis not present

## 2024-07-15 DIAGNOSIS — E1142 Type 2 diabetes mellitus with diabetic polyneuropathy: Secondary | ICD-10-CM | POA: Diagnosis not present

## 2024-07-15 DIAGNOSIS — J440 Chronic obstructive pulmonary disease with acute lower respiratory infection: Secondary | ICD-10-CM | POA: Diagnosis not present

## 2024-07-15 MED ORDER — BUDESON-GLYCOPYRROL-FORMOTEROL 160-9-4.8 MCG/ACT IN AERO: 2.0000 | INHALATION_SPRAY | Freq: Two times a day (BID) | RESPIRATORY_TRACT | 3 refills | Status: DC

## 2024-07-15 NOTE — Telephone Encounter (Signed)
 Wife called and they D/C BREZTRI  in the hospital thinking that it may have interfered with his immunotherapy. He has had increased congestion and wheezing since stopping. Denies fever. Per Dr. Davonna, patient can restart.  Patient aware.

## 2024-07-16 DIAGNOSIS — I4891 Unspecified atrial fibrillation: Secondary | ICD-10-CM | POA: Diagnosis not present

## 2024-07-16 DIAGNOSIS — I714 Abdominal aortic aneurysm, without rupture, unspecified: Secondary | ICD-10-CM | POA: Diagnosis not present

## 2024-07-16 DIAGNOSIS — E1142 Type 2 diabetes mellitus with diabetic polyneuropathy: Secondary | ICD-10-CM | POA: Diagnosis not present

## 2024-07-16 DIAGNOSIS — E1151 Type 2 diabetes mellitus with diabetic peripheral angiopathy without gangrene: Secondary | ICD-10-CM | POA: Diagnosis not present

## 2024-07-16 DIAGNOSIS — J440 Chronic obstructive pulmonary disease with acute lower respiratory infection: Secondary | ICD-10-CM | POA: Diagnosis not present

## 2024-07-16 DIAGNOSIS — J189 Pneumonia, unspecified organism: Secondary | ICD-10-CM | POA: Diagnosis not present

## 2024-07-19 DIAGNOSIS — J189 Pneumonia, unspecified organism: Secondary | ICD-10-CM | POA: Diagnosis not present

## 2024-07-19 DIAGNOSIS — I4891 Unspecified atrial fibrillation: Secondary | ICD-10-CM | POA: Diagnosis not present

## 2024-07-19 DIAGNOSIS — E1142 Type 2 diabetes mellitus with diabetic polyneuropathy: Secondary | ICD-10-CM | POA: Diagnosis not present

## 2024-07-19 DIAGNOSIS — I714 Abdominal aortic aneurysm, without rupture, unspecified: Secondary | ICD-10-CM | POA: Diagnosis not present

## 2024-07-19 DIAGNOSIS — J440 Chronic obstructive pulmonary disease with acute lower respiratory infection: Secondary | ICD-10-CM | POA: Diagnosis not present

## 2024-07-19 DIAGNOSIS — E1151 Type 2 diabetes mellitus with diabetic peripheral angiopathy without gangrene: Secondary | ICD-10-CM | POA: Diagnosis not present

## 2024-07-20 ENCOUNTER — Ambulatory Visit

## 2024-07-20 ENCOUNTER — Inpatient Hospital Stay

## 2024-07-20 ENCOUNTER — Telehealth: Payer: Self-pay | Admitting: Cardiology

## 2024-07-20 ENCOUNTER — Ambulatory Visit (INDEPENDENT_AMBULATORY_CARE_PROVIDER_SITE_OTHER): Payer: Self-pay | Admitting: *Deleted

## 2024-07-20 DIAGNOSIS — I4891 Unspecified atrial fibrillation: Secondary | ICD-10-CM

## 2024-07-20 DIAGNOSIS — I714 Abdominal aortic aneurysm, without rupture, unspecified: Secondary | ICD-10-CM | POA: Diagnosis not present

## 2024-07-20 DIAGNOSIS — Z5181 Encounter for therapeutic drug level monitoring: Secondary | ICD-10-CM

## 2024-07-20 DIAGNOSIS — E1142 Type 2 diabetes mellitus with diabetic polyneuropathy: Secondary | ICD-10-CM | POA: Diagnosis not present

## 2024-07-20 DIAGNOSIS — J189 Pneumonia, unspecified organism: Secondary | ICD-10-CM | POA: Diagnosis not present

## 2024-07-20 DIAGNOSIS — J440 Chronic obstructive pulmonary disease with acute lower respiratory infection: Secondary | ICD-10-CM | POA: Diagnosis not present

## 2024-07-20 DIAGNOSIS — E1151 Type 2 diabetes mellitus with diabetic peripheral angiopathy without gangrene: Secondary | ICD-10-CM | POA: Diagnosis not present

## 2024-07-20 LAB — POCT INR: INR: 3.1 — AB (ref 2.0–3.0)

## 2024-07-20 NOTE — Progress Notes (Signed)
 INR 3.1  Please see anticoagulation encounter

## 2024-07-20 NOTE — Patient Instructions (Signed)
 Continue warfarin 1/2 tablet daily except 1 tablet on Mondays and Fridays Recheck in 1 wk. Orders given to [facility]

## 2024-07-20 NOTE — Telephone Encounter (Signed)
 Corean from [facility] called to report the patient's inr levels to [name] Bathe RN. Corean stated the patient's INR is 3.. Corean stated she will have the patient take his regular dosage of Warfarin if she does not hear back before tomorrow. Please advise.

## 2024-07-20 NOTE — Telephone Encounter (Signed)
 Called [facility].  Orders given to continue current warfarin dose.  See coumadin  note.

## 2024-07-21 DIAGNOSIS — I714 Abdominal aortic aneurysm, without rupture, unspecified: Secondary | ICD-10-CM | POA: Diagnosis not present

## 2024-07-21 DIAGNOSIS — E1151 Type 2 diabetes mellitus with diabetic peripheral angiopathy without gangrene: Secondary | ICD-10-CM | POA: Diagnosis not present

## 2024-07-21 DIAGNOSIS — J189 Pneumonia, unspecified organism: Secondary | ICD-10-CM | POA: Diagnosis not present

## 2024-07-21 DIAGNOSIS — E1142 Type 2 diabetes mellitus with diabetic polyneuropathy: Secondary | ICD-10-CM | POA: Diagnosis not present

## 2024-07-21 DIAGNOSIS — I4891 Unspecified atrial fibrillation: Secondary | ICD-10-CM | POA: Diagnosis not present

## 2024-07-21 DIAGNOSIS — J440 Chronic obstructive pulmonary disease with acute lower respiratory infection: Secondary | ICD-10-CM | POA: Diagnosis not present

## 2024-07-22 ENCOUNTER — Other Ambulatory Visit: Payer: Self-pay | Admitting: Internal Medicine

## 2024-07-22 ENCOUNTER — Encounter: Payer: Self-pay | Admitting: Internal Medicine

## 2024-07-22 DIAGNOSIS — J449 Chronic obstructive pulmonary disease, unspecified: Secondary | ICD-10-CM

## 2024-07-22 MED ORDER — IPRATROPIUM-ALBUTEROL 0.5-2.5 (3) MG/3ML IN SOLN: 3.0000 mL | Freq: Four times a day (QID) | RESPIRATORY_TRACT | 1 refills | Status: DC | PRN

## 2024-07-23 DIAGNOSIS — I4891 Unspecified atrial fibrillation: Secondary | ICD-10-CM | POA: Diagnosis not present

## 2024-07-23 DIAGNOSIS — E1151 Type 2 diabetes mellitus with diabetic peripheral angiopathy without gangrene: Secondary | ICD-10-CM | POA: Diagnosis not present

## 2024-07-23 DIAGNOSIS — J440 Chronic obstructive pulmonary disease with acute lower respiratory infection: Secondary | ICD-10-CM | POA: Diagnosis not present

## 2024-07-23 DIAGNOSIS — I714 Abdominal aortic aneurysm, without rupture, unspecified: Secondary | ICD-10-CM | POA: Diagnosis not present

## 2024-07-23 DIAGNOSIS — J189 Pneumonia, unspecified organism: Secondary | ICD-10-CM | POA: Diagnosis not present

## 2024-07-23 DIAGNOSIS — E1142 Type 2 diabetes mellitus with diabetic polyneuropathy: Secondary | ICD-10-CM | POA: Diagnosis not present

## 2024-07-27 ENCOUNTER — Ambulatory Visit

## 2024-07-27 DIAGNOSIS — I714 Abdominal aortic aneurysm, without rupture, unspecified: Secondary | ICD-10-CM | POA: Diagnosis not present

## 2024-07-27 DIAGNOSIS — E1142 Type 2 diabetes mellitus with diabetic polyneuropathy: Secondary | ICD-10-CM | POA: Diagnosis not present

## 2024-07-27 DIAGNOSIS — I4891 Unspecified atrial fibrillation: Secondary | ICD-10-CM | POA: Diagnosis not present

## 2024-07-27 DIAGNOSIS — E1151 Type 2 diabetes mellitus with diabetic peripheral angiopathy without gangrene: Secondary | ICD-10-CM | POA: Diagnosis not present

## 2024-07-27 DIAGNOSIS — J440 Chronic obstructive pulmonary disease with acute lower respiratory infection: Secondary | ICD-10-CM | POA: Diagnosis not present

## 2024-07-27 DIAGNOSIS — J189 Pneumonia, unspecified organism: Secondary | ICD-10-CM | POA: Diagnosis not present

## 2024-07-27 LAB — POCT INR: INR: 2.8 (ref 2.0–3.0)

## 2024-07-28 ENCOUNTER — Telehealth: Payer: Self-pay | Admitting: Cardiology

## 2024-07-28 DIAGNOSIS — Z7901 Long term (current) use of anticoagulants: Secondary | ICD-10-CM | POA: Diagnosis not present

## 2024-07-28 DIAGNOSIS — J189 Pneumonia, unspecified organism: Secondary | ICD-10-CM | POA: Diagnosis not present

## 2024-07-28 DIAGNOSIS — J9620 Acute and chronic respiratory failure, unspecified whether with hypoxia or hypercapnia: Secondary | ICD-10-CM | POA: Diagnosis not present

## 2024-07-28 DIAGNOSIS — R351 Nocturia: Secondary | ICD-10-CM | POA: Diagnosis not present

## 2024-07-28 DIAGNOSIS — G4700 Insomnia, unspecified: Secondary | ICD-10-CM | POA: Diagnosis not present

## 2024-07-28 DIAGNOSIS — J440 Chronic obstructive pulmonary disease with acute lower respiratory infection: Secondary | ICD-10-CM | POA: Diagnosis not present

## 2024-07-28 DIAGNOSIS — I4892 Unspecified atrial flutter: Secondary | ICD-10-CM | POA: Diagnosis not present

## 2024-07-28 DIAGNOSIS — E1169 Type 2 diabetes mellitus with other specified complication: Secondary | ICD-10-CM | POA: Diagnosis not present

## 2024-07-28 DIAGNOSIS — J441 Chronic obstructive pulmonary disease with (acute) exacerbation: Secondary | ICD-10-CM | POA: Diagnosis not present

## 2024-07-28 DIAGNOSIS — N401 Enlarged prostate with lower urinary tract symptoms: Secondary | ICD-10-CM | POA: Diagnosis not present

## 2024-07-28 DIAGNOSIS — I69311 Memory deficit following cerebral infarction: Secondary | ICD-10-CM | POA: Diagnosis not present

## 2024-07-28 DIAGNOSIS — Z9981 Dependence on supplemental oxygen: Secondary | ICD-10-CM | POA: Diagnosis not present

## 2024-07-28 DIAGNOSIS — C3490 Malignant neoplasm of unspecified part of unspecified bronchus or lung: Secondary | ICD-10-CM | POA: Diagnosis not present

## 2024-07-28 DIAGNOSIS — E785 Hyperlipidemia, unspecified: Secondary | ICD-10-CM | POA: Diagnosis not present

## 2024-07-28 DIAGNOSIS — Z902 Acquired absence of lung [part of]: Secondary | ICD-10-CM | POA: Diagnosis not present

## 2024-07-28 DIAGNOSIS — I4891 Unspecified atrial fibrillation: Secondary | ICD-10-CM | POA: Diagnosis not present

## 2024-07-28 DIAGNOSIS — J439 Emphysema, unspecified: Secondary | ICD-10-CM | POA: Diagnosis not present

## 2024-07-28 DIAGNOSIS — E1151 Type 2 diabetes mellitus with diabetic peripheral angiopathy without gangrene: Secondary | ICD-10-CM | POA: Diagnosis not present

## 2024-07-28 DIAGNOSIS — I69351 Hemiplegia and hemiparesis following cerebral infarction affecting right dominant side: Secondary | ICD-10-CM | POA: Diagnosis not present

## 2024-07-28 DIAGNOSIS — I10 Essential (primary) hypertension: Secondary | ICD-10-CM | POA: Diagnosis not present

## 2024-07-28 DIAGNOSIS — E1142 Type 2 diabetes mellitus with diabetic polyneuropathy: Secondary | ICD-10-CM | POA: Diagnosis not present

## 2024-07-28 DIAGNOSIS — I714 Abdominal aortic aneurysm, without rupture, unspecified: Secondary | ICD-10-CM | POA: Diagnosis not present

## 2024-07-28 LAB — POCT INR: INR: 2.8 (ref 2.0–3.0)

## 2024-07-28 NOTE — Telephone Encounter (Signed)
 Caller Sherline) reporting patient's INR status -  2.8, done yesterday around 5:00 pm and noted patient took yesterday's dose.

## 2024-07-28 NOTE — Telephone Encounter (Signed)
 Forwarded to [facility]

## 2024-07-28 NOTE — Patient Instructions (Signed)
 Continue warfarin 1/2 tablet daily except 1 tablet on Mondays and Fridays Recheck in 1 wk. Orders given to [facility] RN [facility]

## 2024-07-28 NOTE — Telephone Encounter (Signed)
 Called and spoke with [facility].  Warfarin orders given see coumadin  note from today.

## 2024-07-28 NOTE — Progress Notes (Unsigned)
 INR-2.8 Please see anticoagulation encounter

## 2024-07-30 ENCOUNTER — Ambulatory Visit (HOSPITAL_COMMUNITY)
Admission: RE | Admit: 2024-07-30 | Discharge: 2024-07-30 | Disposition: A | Discharge status: Home / Self Care | Source: Ambulatory Visit | Attending: Oncology | Admitting: Oncology

## 2024-07-30 DIAGNOSIS — E669 Obesity, unspecified: Secondary | ICD-10-CM | POA: Diagnosis not present

## 2024-07-30 DIAGNOSIS — Z8673 Personal history of transient ischemic attack (TIA), and cerebral infarction without residual deficits: Secondary | ICD-10-CM | POA: Diagnosis not present

## 2024-07-30 DIAGNOSIS — A419 Sepsis, unspecified organism: Secondary | ICD-10-CM | POA: Diagnosis not present

## 2024-07-30 DIAGNOSIS — I110 Hypertensive heart disease with heart failure: Secondary | ICD-10-CM | POA: Diagnosis not present

## 2024-07-30 DIAGNOSIS — J90 Pleural effusion, not elsewhere classified: Secondary | ICD-10-CM | POA: Diagnosis not present

## 2024-07-30 DIAGNOSIS — J181 Lobar pneumonia, unspecified organism: Secondary | ICD-10-CM | POA: Diagnosis not present

## 2024-07-30 DIAGNOSIS — R918 Other nonspecific abnormal finding of lung field: Secondary | ICD-10-CM | POA: Diagnosis not present

## 2024-07-30 DIAGNOSIS — Z9221 Personal history of antineoplastic chemotherapy: Secondary | ICD-10-CM | POA: Diagnosis not present

## 2024-07-30 DIAGNOSIS — J439 Emphysema, unspecified: Secondary | ICD-10-CM | POA: Diagnosis not present

## 2024-07-30 DIAGNOSIS — C3491 Malignant neoplasm of unspecified part of right bronchus or lung: Secondary | ICD-10-CM | POA: Diagnosis not present

## 2024-07-30 DIAGNOSIS — Z923 Personal history of irradiation: Secondary | ICD-10-CM | POA: Diagnosis not present

## 2024-07-30 DIAGNOSIS — Z87891 Personal history of nicotine dependence: Secondary | ICD-10-CM | POA: Diagnosis not present

## 2024-07-30 DIAGNOSIS — J9611 Chronic respiratory failure with hypoxia: Secondary | ICD-10-CM | POA: Diagnosis not present

## 2024-07-30 DIAGNOSIS — N281 Cyst of kidney, acquired: Secondary | ICD-10-CM | POA: Diagnosis not present

## 2024-07-30 DIAGNOSIS — I5031 Acute diastolic (congestive) heart failure: Secondary | ICD-10-CM | POA: Diagnosis not present

## 2024-07-30 DIAGNOSIS — J432 Centrilobular emphysema: Secondary | ICD-10-CM | POA: Diagnosis not present

## 2024-07-30 DIAGNOSIS — I480 Paroxysmal atrial fibrillation: Secondary | ICD-10-CM | POA: Diagnosis present

## 2024-07-30 DIAGNOSIS — Z1152 Encounter for screening for COVID-19: Secondary | ICD-10-CM | POA: Diagnosis not present

## 2024-07-30 DIAGNOSIS — J441 Chronic obstructive pulmonary disease with (acute) exacerbation: Secondary | ICD-10-CM | POA: Diagnosis present

## 2024-07-30 DIAGNOSIS — C3490 Malignant neoplasm of unspecified part of unspecified bronchus or lung: Secondary | ICD-10-CM | POA: Insufficient documentation

## 2024-07-30 DIAGNOSIS — Z7901 Long term (current) use of anticoagulants: Secondary | ICD-10-CM | POA: Diagnosis not present

## 2024-07-30 DIAGNOSIS — J189 Pneumonia, unspecified organism: Secondary | ICD-10-CM | POA: Diagnosis not present

## 2024-07-30 DIAGNOSIS — I7143 Infrarenal abdominal aortic aneurysm, without rupture: Secondary | ICD-10-CM | POA: Diagnosis not present

## 2024-07-30 DIAGNOSIS — Z9981 Dependence on supplemental oxygen: Secondary | ICD-10-CM | POA: Diagnosis not present

## 2024-07-30 DIAGNOSIS — E1169 Type 2 diabetes mellitus with other specified complication: Secondary | ICD-10-CM | POA: Diagnosis present

## 2024-07-30 DIAGNOSIS — I700 Atherosclerosis of aorta: Secondary | ICD-10-CM | POA: Diagnosis not present

## 2024-07-30 DIAGNOSIS — E1151 Type 2 diabetes mellitus with diabetic peripheral angiopathy without gangrene: Secondary | ICD-10-CM | POA: Diagnosis not present

## 2024-07-30 DIAGNOSIS — K5730 Diverticulosis of large intestine without perforation or abscess without bleeding: Secondary | ICD-10-CM | POA: Diagnosis not present

## 2024-07-30 DIAGNOSIS — Z79899 Other long term (current) drug therapy: Secondary | ICD-10-CM | POA: Diagnosis not present

## 2024-07-30 DIAGNOSIS — E782 Mixed hyperlipidemia: Secondary | ICD-10-CM | POA: Diagnosis present

## 2024-07-30 DIAGNOSIS — Z6830 Body mass index (BMI) 30.0-30.9, adult: Secondary | ICD-10-CM | POA: Diagnosis not present

## 2024-07-30 DIAGNOSIS — K8020 Calculus of gallbladder without cholecystitis without obstruction: Secondary | ICD-10-CM | POA: Diagnosis not present

## 2024-07-30 DIAGNOSIS — N400 Enlarged prostate without lower urinary tract symptoms: Secondary | ICD-10-CM | POA: Diagnosis present

## 2024-07-30 DIAGNOSIS — J440 Chronic obstructive pulmonary disease with acute lower respiratory infection: Secondary | ICD-10-CM | POA: Diagnosis not present

## 2024-07-30 MED ORDER — IOHEXOL 300 MG/ML  SOLN
75.0000 mL | Freq: Once | INTRAMUSCULAR | Status: AC | PRN
  Administered 2024-07-30: 75 mL via INTRAVENOUS

## 2024-08-01 ENCOUNTER — Encounter: Payer: Self-pay | Admitting: Internal Medicine

## 2024-08-02 ENCOUNTER — Emergency Department (HOSPITAL_COMMUNITY)

## 2024-08-02 ENCOUNTER — Other Ambulatory Visit: Payer: Self-pay

## 2024-08-02 ENCOUNTER — Encounter (HOSPITAL_COMMUNITY): Payer: Self-pay | Admitting: Emergency Medicine

## 2024-08-02 ENCOUNTER — Inpatient Hospital Stay (HOSPITAL_COMMUNITY)
Admission: EM | Admit: 2024-08-02 | Discharge: 2024-08-05 | DRG: 871 | Disposition: A | Discharge status: Home Health | Attending: Family Medicine | Admitting: Family Medicine

## 2024-08-02 DIAGNOSIS — Z6830 Body mass index (BMI) 30.0-30.9, adult: Secondary | ICD-10-CM | POA: Diagnosis not present

## 2024-08-02 DIAGNOSIS — I7143 Infrarenal abdominal aortic aneurysm, without rupture: Secondary | ICD-10-CM | POA: Diagnosis present

## 2024-08-02 DIAGNOSIS — J441 Chronic obstructive pulmonary disease with (acute) exacerbation: Secondary | ICD-10-CM | POA: Diagnosis present

## 2024-08-02 DIAGNOSIS — J181 Lobar pneumonia, unspecified organism: Secondary | ICD-10-CM | POA: Diagnosis present

## 2024-08-02 DIAGNOSIS — R918 Other nonspecific abnormal finding of lung field: Secondary | ICD-10-CM | POA: Diagnosis not present

## 2024-08-02 DIAGNOSIS — R791 Abnormal coagulation profile: Secondary | ICD-10-CM | POA: Diagnosis present

## 2024-08-02 DIAGNOSIS — C3490 Malignant neoplasm of unspecified part of unspecified bronchus or lung: Secondary | ICD-10-CM | POA: Diagnosis present

## 2024-08-02 DIAGNOSIS — E669 Obesity, unspecified: Secondary | ICD-10-CM | POA: Diagnosis present

## 2024-08-02 DIAGNOSIS — Z87891 Personal history of nicotine dependence: Secondary | ICD-10-CM

## 2024-08-02 DIAGNOSIS — E1169 Type 2 diabetes mellitus with other specified complication: Secondary | ICD-10-CM | POA: Diagnosis present

## 2024-08-02 DIAGNOSIS — I714 Abdominal aortic aneurysm, without rupture, unspecified: Secondary | ICD-10-CM | POA: Diagnosis present

## 2024-08-02 DIAGNOSIS — J9611 Chronic respiratory failure with hypoxia: Secondary | ICD-10-CM | POA: Diagnosis present

## 2024-08-02 DIAGNOSIS — Z1152 Encounter for screening for COVID-19: Secondary | ICD-10-CM | POA: Diagnosis not present

## 2024-08-02 DIAGNOSIS — I110 Hypertensive heart disease with heart failure: Secondary | ICD-10-CM | POA: Diagnosis present

## 2024-08-02 DIAGNOSIS — Z923 Personal history of irradiation: Secondary | ICD-10-CM

## 2024-08-02 DIAGNOSIS — Z8261 Family history of arthritis: Secondary | ICD-10-CM

## 2024-08-02 DIAGNOSIS — Z825 Family history of asthma and other chronic lower respiratory diseases: Secondary | ICD-10-CM

## 2024-08-02 DIAGNOSIS — J188 Other pneumonia, unspecified organism: Principal | ICD-10-CM

## 2024-08-02 DIAGNOSIS — E782 Mixed hyperlipidemia: Secondary | ICD-10-CM | POA: Diagnosis present

## 2024-08-02 DIAGNOSIS — I480 Paroxysmal atrial fibrillation: Secondary | ICD-10-CM | POA: Diagnosis present

## 2024-08-02 DIAGNOSIS — Z902 Acquired absence of lung [part of]: Secondary | ICD-10-CM

## 2024-08-02 DIAGNOSIS — N400 Enlarged prostate without lower urinary tract symptoms: Secondary | ICD-10-CM | POA: Diagnosis present

## 2024-08-02 DIAGNOSIS — Z87442 Personal history of urinary calculi: Secondary | ICD-10-CM

## 2024-08-02 DIAGNOSIS — J439 Emphysema, unspecified: Secondary | ICD-10-CM | POA: Diagnosis not present

## 2024-08-02 DIAGNOSIS — Z7901 Long term (current) use of anticoagulants: Secondary | ICD-10-CM

## 2024-08-02 DIAGNOSIS — J440 Chronic obstructive pulmonary disease with acute lower respiratory infection: Secondary | ICD-10-CM | POA: Diagnosis present

## 2024-08-02 DIAGNOSIS — A419 Sepsis, unspecified organism: Principal | ICD-10-CM | POA: Diagnosis present

## 2024-08-02 DIAGNOSIS — J90 Pleural effusion, not elsewhere classified: Secondary | ICD-10-CM | POA: Diagnosis not present

## 2024-08-02 DIAGNOSIS — Z9221 Personal history of antineoplastic chemotherapy: Secondary | ICD-10-CM | POA: Diagnosis not present

## 2024-08-02 DIAGNOSIS — Z79899 Other long term (current) drug therapy: Secondary | ICD-10-CM | POA: Diagnosis not present

## 2024-08-02 DIAGNOSIS — Z85118 Personal history of other malignant neoplasm of bronchus and lung: Secondary | ICD-10-CM

## 2024-08-02 DIAGNOSIS — Z9981 Dependence on supplemental oxygen: Secondary | ICD-10-CM | POA: Diagnosis not present

## 2024-08-02 DIAGNOSIS — E1151 Type 2 diabetes mellitus with diabetic peripheral angiopathy without gangrene: Secondary | ICD-10-CM | POA: Diagnosis present

## 2024-08-02 DIAGNOSIS — I700 Atherosclerosis of aorta: Secondary | ICD-10-CM | POA: Diagnosis not present

## 2024-08-02 DIAGNOSIS — Z8673 Personal history of transient ischemic attack (TIA), and cerebral infarction without residual deficits: Secondary | ICD-10-CM | POA: Diagnosis not present

## 2024-08-02 DIAGNOSIS — I509 Heart failure, unspecified: Secondary | ICD-10-CM

## 2024-08-02 DIAGNOSIS — I5031 Acute diastolic (congestive) heart failure: Secondary | ICD-10-CM | POA: Diagnosis present

## 2024-08-02 DIAGNOSIS — N401 Enlarged prostate with lower urinary tract symptoms: Secondary | ICD-10-CM

## 2024-08-02 DIAGNOSIS — E119 Type 2 diabetes mellitus without complications: Secondary | ICD-10-CM | POA: Diagnosis present

## 2024-08-02 LAB — CBC WITH DIFFERENTIAL/PLATELET
Abs Immature Granulocytes: 0.03 K/uL (ref 0.00–0.07)
Basophils Absolute: 0 K/uL (ref 0.0–0.1)
Basophils Relative: 0 %
Eosinophils Absolute: 0 K/uL (ref 0.0–0.5)
Eosinophils Relative: 0 %
HCT: 36.7 % — ABNORMAL LOW (ref 39.0–52.0)
Hemoglobin: 11 g/dL — ABNORMAL LOW (ref 13.0–17.0)
Immature Granulocytes: 0 %
Lymphocytes Relative: 4 %
Lymphs Abs: 0.4 K/uL — ABNORMAL LOW (ref 0.7–4.0)
MCH: 24.3 pg — ABNORMAL LOW (ref 26.0–34.0)
MCHC: 30 g/dL (ref 30.0–36.0)
MCV: 81.2 fL (ref 80.0–100.0)
Monocytes Absolute: 1.4 K/uL — ABNORMAL HIGH (ref 0.1–1.0)
Monocytes Relative: 16 %
Neutro Abs: 6.8 K/uL (ref 1.7–7.7)
Neutrophils Relative %: 80 %
Platelets: 220 K/uL (ref 150–400)
RBC: 4.52 MIL/uL (ref 4.22–5.81)
RDW: 16.3 % — ABNORMAL HIGH (ref 11.5–15.5)
WBC: 8.7 K/uL (ref 4.0–10.5)
nRBC: 0 % (ref 0.0–0.2)

## 2024-08-02 LAB — PROTIME-INR
INR: 2.6 — ABNORMAL HIGH (ref 0.8–1.2)
Prothrombin Time: 29.1 s — ABNORMAL HIGH (ref 11.4–15.2)

## 2024-08-02 LAB — COMPREHENSIVE METABOLIC PANEL WITH GFR
ALT: 13 U/L (ref 0–44)
AST: 16 U/L (ref 15–41)
Albumin: 4.1 g/dL (ref 3.5–5.0)
Alkaline Phosphatase: 44 U/L (ref 38–126)
Anion gap: 6 (ref 5–15)
BUN: 16 mg/dL (ref 8–23)
CO2: 31 mmol/L (ref 22–32)
Calcium: 9.1 mg/dL (ref 8.9–10.3)
Chloride: 100 mmol/L (ref 98–111)
Creatinine, Ser: 0.8 mg/dL (ref 0.61–1.24)
GFR, Estimated: >60 mL/min (ref >60)
Glucose, Bld: 98 mg/dL (ref 70–99)
Potassium: 4.2 mmol/L (ref 3.5–5.1)
Sodium: 137 mmol/L (ref 135–145)
Total Bilirubin: 0.9 mg/dL (ref 0.0–1.2)
Total Protein: 6.7 g/dL (ref 6.5–8.1)

## 2024-08-02 LAB — PROCALCITONIN: Procalcitonin: <0.1 ng/mL

## 2024-08-02 LAB — LACTIC ACID, PLASMA
Lactic Acid, Venous: 0.9 mmol/L (ref 0.5–1.9)
Lactic Acid, Venous: 0.8 mmol/L (ref 0.5–1.9)

## 2024-08-02 LAB — GLUCOSE, CAPILLARY
Glucose-Capillary: 126 mg/dL — ABNORMAL HIGH (ref 70–99)
Glucose-Capillary: 107 mg/dL — ABNORMAL HIGH (ref 70–99)

## 2024-08-02 LAB — TROPONIN T, HIGH SENSITIVITY
Troponin T High Sensitivity: 32 ng/L — ABNORMAL HIGH (ref 0–19)
Troponin T High Sensitivity: 28 ng/L — ABNORMAL HIGH (ref 0–19)

## 2024-08-02 LAB — RESP PANEL BY RT-PCR (RSV, FLU A&B, COVID)  RVPGX2
Influenza A by PCR: NEGATIVE
Influenza B by PCR: NEGATIVE
Resp Syncytial Virus by PCR: NEGATIVE
SARS Coronavirus 2 by RT PCR: NEGATIVE

## 2024-08-02 LAB — FOLATE: Folate: 8.3 ng/mL (ref >5.9)

## 2024-08-02 LAB — T4, FREE: Free T4: 0.99 ng/dL (ref 0.61–1.12)

## 2024-08-02 LAB — VITAMIN B12: Vitamin B-12: 256 pg/mL (ref 180–914)

## 2024-08-02 LAB — PRO BRAIN NATRIURETIC PEPTIDE: Pro Brain Natriuretic Peptide: 2402 pg/mL — ABNORMAL HIGH (ref <300.0)

## 2024-08-02 LAB — TSH: TSH: 1.09 u[IU]/mL (ref 0.350–4.500)

## 2024-08-02 LAB — LIPASE, BLOOD: Lipase: 16 U/L (ref 11–51)

## 2024-08-02 MED ORDER — LORATADINE 10 MG PO TABS
10.0000 mg | ORAL_TABLET | Freq: Every day | ORAL | Status: DC
  Administered 2024-08-03 – 2024-08-05 (×3): 10 mg via ORAL
  Filled 2024-08-02 (×3): qty 1

## 2024-08-02 MED ORDER — WARFARIN SODIUM 5 MG PO TABS
5.0000 mg | ORAL_TABLET | Freq: Once | ORAL | Status: AC
  Administered 2024-08-02: 5 mg via ORAL
  Filled 2024-08-02: qty 1

## 2024-08-02 MED ORDER — SODIUM CHLORIDE 0.9 % IV SOLN
1.0000 g | INTRAVENOUS | Status: DC
  Administered 2024-08-03 – 2024-08-05 (×3): 1 g via INTRAVENOUS
  Filled 2024-08-02 (×3): qty 10

## 2024-08-02 MED ORDER — SODIUM CHLORIDE 0.9 % IV SOLN
1.0000 g | Freq: Once | INTRAVENOUS | Status: AC
  Administered 2024-08-02: 1 g via INTRAVENOUS
  Filled 2024-08-02: qty 10

## 2024-08-02 MED ORDER — DILTIAZEM HCL ER COATED BEADS 120 MG PO CP24
120.0000 mg | ORAL_CAPSULE | Freq: Every day | ORAL | Status: DC
Start: 2024-08-03 — End: 2024-08-05
  Administered 2024-08-03 – 2024-08-05 (×3): 120 mg via ORAL
  Filled 2024-08-02 (×3): qty 1

## 2024-08-02 MED ORDER — IPRATROPIUM-ALBUTEROL 0.5-2.5 (3) MG/3ML IN SOLN
3.0000 mL | Freq: Four times a day (QID) | RESPIRATORY_TRACT | Status: DC
  Administered 2024-08-02 – 2024-08-05 (×11): 3 mL via RESPIRATORY_TRACT
  Filled 2024-08-02 (×11): qty 3

## 2024-08-02 MED ORDER — ONDANSETRON HCL 4 MG PO TABS: 4.0000 mg | ORAL_TABLET | Freq: Four times a day (QID) | ORAL | Status: DC | PRN

## 2024-08-02 MED ORDER — ONDANSETRON HCL 4 MG/2ML IJ SOLN: 4.0000 mg | Freq: Four times a day (QID) | INTRAMUSCULAR | Status: DC | PRN

## 2024-08-02 MED ORDER — METHYLPREDNISOLONE SODIUM SUCC 40 MG IJ SOLR
40.0000 mg | Freq: Two times a day (BID) | INTRAMUSCULAR | Status: DC
  Administered 2024-08-02 – 2024-08-05 (×6): 40 mg via INTRAVENOUS
  Filled 2024-08-02 (×6): qty 1

## 2024-08-02 MED ORDER — FINASTERIDE 5 MG PO TABS: 5.0000 mg | ORAL_TABLET | Freq: Every day | ORAL | 0 refills | Status: DC

## 2024-08-02 MED ORDER — IOHEXOL 350 MG/ML SOLN
75.0000 mL | Freq: Once | INTRAVENOUS | Status: AC | PRN
  Administered 2024-08-02: 75 mL via INTRAVENOUS

## 2024-08-02 MED ORDER — ACETAMINOPHEN 650 MG RE SUPP: 650.0000 mg | Freq: Four times a day (QID) | RECTAL | Status: DC | PRN

## 2024-08-02 MED ORDER — MAGNESIUM OXIDE -MG SUPPLEMENT 400 (240 MG) MG PO TABS
400.0000 mg | ORAL_TABLET | Freq: Two times a day (BID) | ORAL | Status: DC
  Administered 2024-08-02 – 2024-08-05 (×6): 400 mg via ORAL
  Filled 2024-08-02 (×6): qty 1

## 2024-08-02 MED ORDER — FENOFIBRATE 160 MG PO TABS
160.0000 mg | ORAL_TABLET | Freq: Every day | ORAL | Status: DC
  Administered 2024-08-03 – 2024-08-05 (×3): 160 mg via ORAL
  Filled 2024-08-02 (×3): qty 1

## 2024-08-02 MED ORDER — ARFORMOTEROL TARTRATE 15 MCG/2ML IN NEBU
15.0000 ug | INHALATION_SOLUTION | Freq: Two times a day (BID) | RESPIRATORY_TRACT | Status: DC
  Administered 2024-08-02 – 2024-08-05 (×6): 15 ug via RESPIRATORY_TRACT
  Filled 2024-08-02 (×6): qty 2

## 2024-08-02 MED ORDER — ROSUVASTATIN CALCIUM 20 MG PO TABS
40.0000 mg | ORAL_TABLET | Freq: Every day | ORAL | Status: DC
  Administered 2024-08-03 – 2024-08-05 (×3): 40 mg via ORAL
  Filled 2024-08-02 (×3): qty 2

## 2024-08-02 MED ORDER — BUDESONIDE 0.5 MG/2ML IN SUSP
0.5000 mg | Freq: Two times a day (BID) | RESPIRATORY_TRACT | Status: DC
  Administered 2024-08-02 – 2024-08-05 (×6): 0.5 mg via RESPIRATORY_TRACT
  Filled 2024-08-02 (×6): qty 2

## 2024-08-02 MED ORDER — METOPROLOL SUCCINATE ER 50 MG PO TB24
100.0000 mg | ORAL_TABLET | Freq: Every day | ORAL | Status: DC
  Administered 2024-08-03 – 2024-08-05 (×3): 100 mg via ORAL
  Filled 2024-08-02 (×3): qty 2

## 2024-08-02 MED ORDER — ENOXAPARIN SODIUM 40 MG/0.4ML IJ SOSY: 40.0000 mg | PREFILLED_SYRINGE | INTRAMUSCULAR | Status: DC

## 2024-08-02 MED ORDER — ACETAMINOPHEN 325 MG PO TABS: 650.0000 mg | ORAL_TABLET | Freq: Four times a day (QID) | ORAL | Status: DC | PRN

## 2024-08-02 MED ORDER — FUROSEMIDE 10 MG/ML IJ SOLN
40.0000 mg | Freq: Once | INTRAMUSCULAR | Status: AC
  Administered 2024-08-02: 40 mg via INTRAVENOUS
  Filled 2024-08-02: qty 4

## 2024-08-02 MED ORDER — DOXYCYCLINE HYCLATE 100 MG PO TABS
100.0000 mg | ORAL_TABLET | Freq: Two times a day (BID) | ORAL | Status: DC
  Administered 2024-08-02 – 2024-08-05 (×6): 100 mg via ORAL
  Filled 2024-08-02 (×6): qty 1

## 2024-08-02 MED ORDER — WARFARIN - PHARMACIST DOSING INPATIENT: Freq: Every day | Status: DC

## 2024-08-02 MED ORDER — FINASTERIDE 5 MG PO TABS
5.0000 mg | ORAL_TABLET | Freq: Every day | ORAL | Status: DC
  Administered 2024-08-03 – 2024-08-05 (×3): 5 mg via ORAL
  Filled 2024-08-02 (×3): qty 1

## 2024-08-02 MED ORDER — SODIUM CHLORIDE 0.9 % IV SOLN
500.0000 mg | Freq: Once | INTRAVENOUS | Status: AC
  Administered 2024-08-02: 500 mg via INTRAVENOUS
  Filled 2024-08-02: qty 5

## 2024-08-02 NOTE — H&P (Signed)
 History and Physical    Patient: Randall Sullivan FMW:[id number] DOB: Oct 20, 1950 DOA: 08/02/2024 DOS: the patient was seen and examined on 08/02/2024 PCP: Tobie Suzzane POUR, MD  Patient coming from: Home  Chief Complaint:  Chief Complaint  Patient presents with   Shortness of Breath   Chest Pain   HPI: Randall Sullivan is a 73 year old male with a history of non-small cell lung cancer, COPD, chronic respiratory failure on 2 L, proximal atrial fibrillation, AAA, hypertension, hyperlipidemia, prediabetes, BPH, CVA 02/2021 presenting with 2-day history of shortness of breath, coughing, and generalized weakness.  The patient has had some subjective fevers and chills.  He denies any headache, neck pain, hemoptysis, nausea, vomiting, diarrhea.  The patient did have some lower chest pain worsening with coughing.  He denies any dysuria, hematochezia, melena, hematuria.  He denies any increasing abdominal girth or lower extremity edema worsening.  He states that his weight is actually decreased in the past 2 to 3 weeks.  He has had decreased oral intake.  Notably, the patient was hospitalized from 06/21/2024 to 06/26/2024 for pneumonia and COPD exacerbation.  The patient developed atrial fibrillation with RVR at that time and required DCCV on 06/25/2024.  He was discharged home with metoprolol  100 mg daily, diltiazem  CD1 120 mg daily and continued on warfarin.  He was discharged with levofloxacin .  In the ED, the patient was afebrile and hemodynamically stable with oxygen  saturation 97% on 3 L.  WBC 8.7, hemoglobin 1.0, platelets 220.  Sodium 137, potassium 4.2, bicarbonate 31, serum creatinine 0.80.  LFTs were unremarkable.  CTA chest was negative for PE.  Showed interval development of RUL peripheral consolidation; scattered GGO LLL and RLL.  There is unchanged loculated effusion on the right major fissure and trace right pleural effusion.  CT abdomen pelvis was negative for any acute findings.  He had cholelithiasis  without cholecystitis.  There was an unchanged infrarenal aortic aneurysm measuring 4.5 x 4.4 cm.  There is no hydronephrosis.  The patient was started on ceftriaxone  and azithromycin .  He was given furosemide  40 mg IV x 1.  Review of Systems: As mentioned in the history of present illness. All other systems reviewed and are negative. Past Medical History:  Diagnosis Date   A-fib [facility]) 2017   after last lung surgery patient had a history if Afib documented in hospital    AAA (abdominal aortic aneurysm)    4.0 cm 09/17/22 US    Acute appendicitis 07/14/2021   Arthritis    back & legs ( knees)   Cancer (HCC)    lung   Complication of anesthesia    agitated upon waking from anesth.    Dyspnea    Dysrhythmia    A. Fib   Emphysema    Emphysema of lung (HCC)    History of kidney stones    Hyperlipidemia    Hypertension    Oxygen  dependent    2L continuous   Peripheral vascular disease    Aortic Aneurysm   Pneumonia    Pre-diabetes    Sleep apnea    No OSA, but wears 2L [place] O2 continuous including at night   Stroke [facility]) 2022   Right sided weakness, decreased hearing, balance and memory loss   Tobacco abuse    Past Surgical History:  Procedure Laterality Date   CARDIOVERSION N/A 06/25/2024   Procedure: CARDIOVERSION;  Surgeon: Alvan Dorn FALCON, MD;  Location: [facility];  Service: Endoscopy;  Laterality: N/A;   HERNIA REPAIR  Bilateral 1999   umbilical   IR IMAGING GUIDED PORT INSERTION  12/09/2023   LAPAROSCOPIC APPENDECTOMY N/A 07/15/2021   Procedure: APPENDECTOMY LAPAROSCOPIC;  Surgeon: Kallie Manuelita BROCKS, MD;  Location: [facility];  Service: General;  Laterality: N/A;   VIDEO ASSISTED THORACOSCOPY Right 12/21/2018   Procedure: VIDEO ASSISTED THORACOSCOPY AND RESECTION OF BLEBS RIGHT LOWER LOBE;  Surgeon: Kerrin Elspeth BROCKS, MD;  Location: [facility] OR;  Service: Thoracic;  Laterality: Right;   VIDEO ASSISTED THORACOSCOPY (VATS)/ LOBECTOMY Left 08/01/2016   Procedure: VIDEO ASSISTED  THORACOSCOPY (VATS)/ LEFT UPPER LOBECTOMY with lymph node sampling and onQ placement;  Surgeon: Elspeth BROCKS Kerrin, MD;  Location: [facility] OR;  Service: Thoracic;  Laterality: Left;   VIDEO ASSISTED THORACOSCOPY (VATS)/WEDGE RESECTION Right 12/09/2018   Procedure: VIDEO ASSISTED THORACOSCOPY (VATS)/WEDGE RESECTION;  Surgeon: Kerrin Elspeth BROCKS, MD;  Location: [facility];  Service: Thoracic;  Laterality: Right;   VIDEO BRONCHOSCOPY Bilateral 12/21/2018   Procedure: VIDEO BRONCHOSCOPY;  Surgeon: Kerrin Elspeth BROCKS, MD;  Location: [facility];  Service: Thoracic;  Laterality: Bilateral;   VIDEO BRONCHOSCOPY WITH ENDOBRONCHIAL NAVIGATION N/A 07/03/2016   Procedure: VIDEO BRONCHOSCOPY WITH ENDOBRONCHIAL NAVIGATION;  Surgeon: Elspeth BROCKS Kerrin, MD;  Location: [facility];  Service: Thoracic;  Laterality: N/A;   VIDEO BRONCHOSCOPY WITH ENDOBRONCHIAL NAVIGATION N/A 10/24/2023   Procedure: VIDEO BRONCHOSCOPY WITH ENDOBRONCHIAL NAVIGATION;  Surgeon: Kerrin Elspeth BROCKS, MD;  Location: [facility];  Service: Thoracic;  Laterality: N/A;   VIDEO BRONCHOSCOPY WITH ENDOBRONCHIAL ULTRASOUND N/A 10/24/2023   Procedure: VIDEO BRONCHOSCOPY WITH ENDOBRONCHIAL ULTRASOUND;  Surgeon: Kerrin Elspeth BROCKS, MD;  Location: [facility];  Service: Thoracic;  Laterality: N/A;   VIDEO BRONCHOSCOPY WITH INSERTION OF INTERBRONCHIAL VALVE (IBV) N/A 12/11/2018   Procedure: VIDEO BRONCHOSCOPY WITH INSERTION OF INTERBRONCHIAL VALVE (IBV);  Surgeon: Kerrin Elspeth BROCKS, MD;  Location: [facility] OR;  Service: Thoracic;  Laterality: N/A;   VIDEO BRONCHOSCOPY WITH INSERTION OF INTERBRONCHIAL VALVE (IBV) N/A 02/10/2019   Procedure: VIDEO BRONCHOSCOPY WITH REMOVAL OF INTERBRONCHIAL VALVE (IBV);  Surgeon: Kerrin Elspeth BROCKS, MD;  Location: [facility] OR;  Service: Thoracic;  Laterality: N/A;   Social History:  reports that he quit smoking about 8 years ago. His smoking use included cigarettes. He started smoking about 48 years ago. He has a 40 pack-year smoking history. He  has never used smokeless tobacco. He reports that he does not currently use alcohol. He reports that he does not use drugs.  No Known Allergies  Family History  Problem Relation Age of Onset   Cancer Mother    Cancer Sister    Aneurysm Sister    Kidney disease Paternal Aunt    Asthma Brother    Cancer - Lung Brother    Cancer Brother    Arthritis Brother     Prior to Admission medications   Medication Sig Start Date End Date Taking? Authorizing Provider  albuterol  (VENTOLIN  HFA) 108 (90 Base) MCG/ACT inhaler Inhale 2 puffs into the lungs every 4 (four) hours as needed for wheezing or shortness of breath. 06/26/24  Yes Johnson, Clanford L, MD  budesonide -glycopyrrolate -formoterol  (BREZTRI  AEROSPHERE) 160-9-4.8 MCG/ACT AERO inhaler Inhale 2 puffs into the lungs 2 (two) times daily.   Yes [provider]  cetirizine  (ZYRTEC ) 10 MG tablet Take 10 mg by mouth daily.   Yes [provider]  diltiazem  (CARDIZEM  CD) 120 MG 24 hr capsule Take 1 capsule (120 mg total) by mouth daily. 06/24/24  Yes Shahmehdi, Seyed A, MD  fenofibrate  (TRICOR ) 145 MG tablet Take 1 tablet (145  mg total) by mouth daily. 06/30/24  Yes Tobie Suzzane POUR, MD  finasteride  (PROSCAR ) 5 MG tablet Take 1 tablet (5 mg total) by mouth daily. 08/02/24  Yes Tobie Suzzane POUR, MD  guaiFENesin -dextromethorphan  (ROBITUSSIN DM) 100-10 MG/5ML syrup Take 10 mLs by mouth every 8 (eight) hours as needed for cough. 06/26/24  Yes Johnson, Clanford L, MD  ipratropium-albuterol  (DUONEB) 0.5-2.5 (3) MG/3ML SOLN Take 3 mLs by nebulization every 6 (six) hours as needed. 07/22/24  Yes Tobie Suzzane POUR, MD  magnesium  oxide (MAG-OX) 400 (240 Mg) MG tablet Take 1 tablet (400 mg total) by mouth 2 (two) times daily. 07/12/24  Yes Tobie Suzzane POUR, MD  metoprolol  succinate (TOPROL  XL) 100 MG 24 hr tablet Take 1 tablet (100 mg total) by mouth daily. Take with or immediately following a meal. 06/24/24 08/02/24 Yes Shahmehdi, Seyed A, MD  OXYGEN   Inhale 2 L into the lungs continuous. Hx of lung ca, copd, and emphysema   Yes [provider]  Palonosetron  HCl (ALOXI  IV) Inject into the vein.   Yes [provider]  rosuvastatin  (CRESTOR ) 40 MG tablet Take 1 tablet (40 mg total) by mouth daily. 09/03/23  Yes Lavona Agent, MD  tamsulosin  (FLOMAX ) 0.4 MG CAPS capsule Take 2 capsules (0.8 mg total) by mouth daily. 10/27/23  Yes Tobie Suzzane POUR, MD  warfarin (COUMADIN ) 5 MG tablet Take 0.5-1 tablets (2.5-5 mg total) by mouth See admin instructions. Take 2.5 mg by mouth Tuesday and Friday and take 5 mg on Monday, Wednesday, Thursday, Saturday and Sunday 06/28/24  Yes Johnson, Clanford L, MD  hydrOXYzine  (ATARAX ) 25 MG tablet Take 1 tablet (25 mg total) by mouth every 8 (eight) hours as needed for itching. Patient not taking: Reported on 08/02/2024 05/11/24   Tobie Suzzane POUR, MD    Physical Exam: Vitals:   08/02/24 1300 08/02/24 1315 08/02/24 1330 08/02/24 1345  BP: 110/60 113/63 126/69   Pulse: 67 77 74 74  Resp: 14 (!) 21 20 (!) 24  Temp:      TempSrc:      SpO2: 97% 94% (!) 88% 98%   GENERAL:  A&O x 3, NAD, well developed, cooperative, follows commands HEENT: [place]/AT, No thrush, No icterus, No oral ulcers Neck:  No neck mass, No meningismus, soft, supple CV: RRR, no S3, no S4, no rub, no JVD Lungs: Diminished breath sounds.  Bibasilar rales.  Bibasilar expiratory wheeze. Abd: soft/NT +BS, nondistended Ext: No edema, no lymphangitis, no cyanosis, no rashes Neuro:  CN II-XII intact, strength 4/5 in RUE, RLE, strength 4/5 LUE, LLE; sensation intact bilateral; no dysmetria; babinski equivocal  Data Reviewed: Data reviewed above in the history  Assessment and Plan: Sepsis - Present on admission - Presented with tachycardia and tachypnea - Secondary to pneumonia - Check PCT - Lactic acid 0.9 -COVID/RSV/Flu -neg -viral respiratory panel  Lobar pneumonia -08/02/2024 CTA chest as discussed above - Continue  ceftriaxone  - Start doxycycline  - Check PCT - Urine Legionella antigen - Urine Streptococcus pneumoniae antigen - MRSA screen  COPD exacerbation - Start Brovana  - Start Pulmicort  - Start IV Solu-Medrol  - Start DuoNebs  Chronic respiratory failure with hypoxia - Chronically on 2 L  Recurrent non-small cell lung cancer--stage III -S/p chemoradiation with carboplatin  and paclitaxel . Currently on consolidation durvalumab .  -left VATS in 2017 with lobectomy and lymph node resection  -right VATS with right upper lobe nodule and bleb resection in 2020,   Elevated troponin - Not consistent with ACS - Troponin 32>> 28  Diabetes mellitus type 2, controlled - Currently off any agents - 05/25/2024 hemoglobin A1c 5.4  Infrarenal AAA - Unchanged on CTA - Continue outpatient surveillance  Mixed hyperlipidemia - Continue statin  BPH - Continue tamsulosin   Essential hypertension - Home metoprolol  succinate and diltiazem  CD   Advance Care Planning: FULL  Consults: none  Family Communication: wife 11/3  Severity of Illness: The appropriate patient status for this patient is INPATIENT. Inpatient status is judged to be reasonable and necessary in order to provide the required intensity of service to ensure the patient's safety. The patient's presenting symptoms, physical exam findings, and initial radiographic and laboratory data in the context of their chronic comorbidities is felt to place them at high risk for further clinical deterioration. Furthermore, it is not anticipated that the patient will be medically stable for discharge from the hospital within 2 midnights of admission.   * I certify that at the point of admission it is my clinical judgment that the patient will require inpatient hospital care spanning beyond 2 midnights from the point of admission due to high intensity of service, high risk for further deterioration and high frequency of surveillance  required.*  Author: Alm Schneider, MD 08/02/2024 3:08 PM  For on call review [email or web address].

## 2024-08-02 NOTE — Sepsis Progress Note (Signed)
 Sepsis protocol monitored by eLink

## 2024-08-02 NOTE — Hospital Course (Addendum)
 73 year old male with a history of non-small cell lung cancer, COPD, chronic respiratory failure on 2 L, proximal atrial fibrillation, AAA, hypertension, hyperlipidemia, prediabetes, BPH, CVA 02/2021 presenting with 2-day history of shortness of breath, coughing, and generalized weakness.  The patient has had some subjective fevers and chills.  He denies any headache, neck pain, hemoptysis, nausea, vomiting, diarrhea.  The patient did have some lower chest pain worsening with coughing.  He denies any dysuria, hematochezia, melena, hematuria.  He denies any increasing abdominal girth or lower extremity edema worsening.  He states that his weight is actually decreased in the past 2 to 3 weeks.  He has had decreased oral intake.  Notably, the patient was hospitalized from 06/21/2024 to 06/26/2024 for pneumonia and COPD exacerbation.  The patient developed atrial fibrillation with RVR at that time and required DCCV on 06/25/2024.  He was discharged home with metoprolol  100 mg daily, diltiazem  CD1 120 mg daily and continued on warfarin.  He was discharged with levofloxacin .  In the ED, the patient was afebrile and hemodynamically stable with oxygen  saturation 97% on 3 L.  WBC 8.7, hemoglobin 1.0, platelets 220.  Sodium 137, potassium 4.2, bicarbonate 31, serum creatinine 0.80.  LFTs were unremarkable.  CTA chest was negative for PE.  Showed interval development of RUL peripheral consolidation; scattered GGO LLL and RLL.  There is unchanged loculated effusion on the right major fissure and trace right pleural effusion.  CT abdomen pelvis was negative for any acute findings.  He had cholelithiasis without cholecystitis.  There was an unchanged infrarenal aortic aneurysm measuring 4.5 x 4.4 cm.  There is no hydronephrosis.  The patient was started on ceftriaxone  and azithromycin .  He was given furosemide  40 mg IV x 1.

## 2024-08-02 NOTE — Progress Notes (Signed)
 PHARMACY - ANTICOAGULATION CONSULT NOTE  Pharmacy Consult for warfarin Indication: atrial fibrillation  No Known Allergies  Patient Measurements: Weight: 93.4 kg (206 lb)  Vital Signs: Temp: 97.5 F (36.4 C) (11/03 1540) Temp Source: Oral (11/03 1540) BP: 109/78 (11/03 1540) Pulse Rate: 74 (11/03 1540)  Labs: Recent Labs    08/02/24 0805  HGB 11.0*  HCT 36.7*  PLT 220  LABPROT 29.1*  INR 2.6*  CREATININE 0.80    Estimated Creatinine Clearance: 91.4 mL/min (by C-G formula based on SCr of 0.8 mg/dL).   Medical History: Past Medical History:  Diagnosis Date   A-fib (HCC) 2017   after last lung surgery patient had a history if Afib documented in hospital    AAA (abdominal aortic aneurysm)    4.0 cm 09/17/22 US    Acute appendicitis 07/14/2021   Arthritis    back & legs ( knees)   Cancer (HCC)    lung   Complication of anesthesia    agitated upon waking from anesth.    Dyspnea    Dysrhythmia    A. Fib   Emphysema    Emphysema of lung (HCC)    History of kidney stones    Hyperlipidemia    Hypertension    Oxygen  dependent    2L continuous   Peripheral vascular disease    Aortic Aneurysm   Pneumonia    Pre-diabetes    Sleep apnea    No OSA, but wears 2L [place] O2 continuous including at night   Stroke [facility]) 2022   Right sided weakness, decreased hearing, balance and memory loss   Tobacco abuse     Medications:  Medications Prior to Admission  Medication Sig Dispense Refill Last Dose/Taking   albuterol  (VENTOLIN  HFA) 108 (90 Base) MCG/ACT inhaler Inhale 2 puffs into the lungs every 4 (four) hours as needed for wheezing or shortness of breath. 8 g 2 08/01/2024 Noon   budesonide -glycopyrrolate -formoterol  (BREZTRI  AEROSPHERE) 160-9-4.8 MCG/ACT AERO inhaler Inhale 2 puffs into the lungs 2 (two) times daily.   08/02/2024 at  6:30 AM   cetirizine  (ZYRTEC ) 10 MG tablet Take 10 mg by mouth daily.   08/02/2024 at  6:30 AM   diltiazem  (CARDIZEM  CD) 120 MG 24 hr capsule  Take 1 capsule (120 mg total) by mouth daily. 30 capsule 1 08/02/2024 at  6:30 AM   fenofibrate  (TRICOR ) 145 MG tablet Take 1 tablet (145 mg total) by mouth daily. 90 tablet 1 08/02/2024 at  6:30 AM   finasteride  (PROSCAR ) 5 MG tablet Take 1 tablet (5 mg total) by mouth daily. 30 tablet 0 08/02/2024 at  6:30 AM   guaiFENesin -dextromethorphan  (ROBITUSSIN DM) 100-10 MG/5ML syrup Take 10 mLs by mouth every 8 (eight) hours as needed for cough.   Past Week   ipratropium-albuterol  (DUONEB) 0.5-2.5 (3) MG/3ML SOLN Take 3 mLs by nebulization every 6 (six) hours as needed. 360 mL 1 08/02/2024 at  6:30 AM   magnesium  oxide (MAG-OX) 400 (240 Mg) MG tablet Take 1 tablet (400 mg total) by mouth 2 (two) times daily. 60 tablet 5 08/02/2024 at  6:30 AM   metoprolol  succinate (TOPROL  XL) 100 MG 24 hr tablet Take 1 tablet (100 mg total) by mouth daily. Take with or immediately following a meal. 30 tablet 1 08/01/2024 at  6:00 AM   OXYGEN  Inhale 2 L into the lungs continuous. Hx of lung ca, copd, and emphysema   08/02/2024 Morning   Palonosetron  HCl (ALOXI  IV) Inject into the vein.  Past Month   rosuvastatin  (CRESTOR ) 40 MG tablet Take 1 tablet (40 mg total) by mouth daily. 90 tablet 3 08/02/2024 at  6:30 AM   tamsulosin  (FLOMAX ) 0.4 MG CAPS capsule Take 2 capsules (0.8 mg total) by mouth daily. 180 capsule 3 08/02/2024 at  6:30 AM   warfarin (COUMADIN ) 5 MG tablet Take 0.5-1 tablets (2.5-5 mg total) by mouth See admin instructions. Take 2.5 mg by mouth Tuesday and Friday and take 5 mg on Monday, Wednesday, Thursday, Saturday and Sunday   08/01/2024 at  5:00 PM   hydrOXYzine  (ATARAX ) 25 MG tablet Take 1 tablet (25 mg total) by mouth every 8 (eight) hours as needed for itching. (Patient not taking: Reported on 08/02/2024) 30 tablet 5 Not Taking    Assessment: Pharmacy consulted to dose warfarin in patient with atrial fibrillation.  Patient is on 5 mg every Mon+Fri and 2.5 mg ROW PTA.  INR on admission is therapeutic at 2.6 with  last dose 11/2.  CBC WNL  Goal of Therapy:  INR 2-3 Monitor platelets by anticoagulation protocol: Yes   Plan:  Warfarin 5 mg x 1 dose. Monitor daily INR and s/s of bleeding  Elspeth Sour, PharmD Clinical Pharmacist 08/02/2024 3:50 PM

## 2024-08-02 NOTE — Plan of Care (Signed)

## 2024-08-02 NOTE — ED Triage Notes (Signed)
 Pt c/o of central cp and sob x 2 days. Pt's wife states more weakness and increased sob with exertion.

## 2024-08-02 NOTE — ED Provider Notes (Signed)
  EMERGENCY DEPARTMENT AT [facility] Provider Note  CSN: [id number] Arrival date & time: 08/02/24 9276  Chief Complaint(s) Shortness of Breath and Chest Pain  HPI Randall Sullivan is a 73 y.o. male with past medical history as below, significant for atrial fibrillation on warfarin, AAA, emphysema/COPD, hyperlipidemia who presents to the ED with complaint of dyspnea, lower chest pain  Patient here with spouse for reports intermittent lower chest pain over the past 2 days, exertional dyspnea, malaise, cough.  Generalized fatigue/malaise.  Poor appetite.  Has been compliant with typical medications per patient/family.  No vomiting or nausea, no change in bowel or bladder function.  Not currently having any chest pain.  No sick contacts, no recent travel  Past Medical History Past Medical History:  Diagnosis Date   A-fib (HCC) 2017   after last lung surgery patient had a history if Afib documented in hospital    AAA (abdominal aortic aneurysm)    4.0 cm 09/17/22 US    Acute appendicitis 07/14/2021   Arthritis    back & legs ( knees)   Cancer (HCC)    lung   Complication of anesthesia    agitated upon waking from anesth.    Dyspnea    Dysrhythmia    A. Fib   Emphysema    Emphysema of lung (HCC)    History of kidney stones    Hyperlipidemia    Hypertension    Oxygen  dependent    2L continuous   Peripheral vascular disease    Aortic Aneurysm   Pneumonia    Pre-diabetes    Sleep apnea    No OSA, but wears 2L [place] O2 continuous including at night   Stroke (HCC) 2022   Right sided weakness, decreased hearing, balance and memory loss   Tobacco abuse    Patient Active Problem List   Diagnosis Date Noted   Atrial fibrillation with RVR (HCC) 06/21/2024   CAP (community acquired pneumonia) 06/21/2024   Atrial fibrillation (HCC) 06/21/2024   Peripheral neuropathy 05/25/2024   Allergic dermatitis 05/11/2024   Recurrent non-small cell lung cancer (NSCLC) (HCC)  12/03/2023   COPD exacerbation (HCC) 09/14/2023   OSA (obstructive sleep apnea) 11/25/2022   Elevated coronary artery calcium  score 09/03/2022   Insomnia 11/09/2021   History of CVA with residual deficit 11/09/2021   Decreased peripheral vision of both eyes 11/09/2021   S/P appendectomy 11/09/2021   Infrarenal abdominal aortic aneurysm (AAA) without rupture 07/14/2021   Type 2 diabetes mellitus with other specified complication (HCC) 07/09/2021   Benign prostatic hyperplasia with nocturia 06/25/2021   Right thalamic infarction (HCC) 03/14/2021   Right sided weakness 03/12/2021   Aortic atherosclerosis 08/30/2020   Hyperlipidemia 11/17/2019   S/P Wedge Resection RUL, Blebectomy RUL 12/09/2018   Hearing loss of right ear 06/04/2018   Primary lung adenocarcinoma (HCC) 10/15/2016   Encounter for therapeutic drug monitoring 09/16/2016   S/P lobectomy of lung 08/05/2016   Former smoker 06/11/2016   COPD GOLD 3 using 02 prn 06/11/2016   Essential (primary) hypertension 10/06/2014   Home Medication(s) Prior to Admission medications   Medication Sig Start Date End Date Taking? Authorizing Provider  albuterol  (VENTOLIN  HFA) 108 (90 Base) MCG/ACT inhaler Inhale 2 puffs into the lungs every 4 (four) hours as needed for wheezing or shortness of breath. 06/26/24  Yes Johnson, Clanford L, MD  budesonide -glycopyrrolate -formoterol  (BREZTRI  AEROSPHERE) 160-9-4.8 MCG/ACT AERO inhaler Inhale 2 puffs into the lungs 2 (two) times daily.   Yes [provider]  cetirizine  (ZYRTEC ) 10 MG tablet Take 10 mg by mouth daily.   Yes [provider]  diltiazem  (CARDIZEM  CD) 120 MG 24 hr capsule Take 1 capsule (120 mg total) by mouth daily. 06/24/24  Yes Shahmehdi, Seyed A, MD  fenofibrate  (TRICOR ) 145 MG tablet Take 1 tablet (145 mg total) by mouth daily. 06/30/24  Yes Tobie Suzzane POUR, MD  finasteride  (PROSCAR ) 5 MG tablet Take 1 tablet (5 mg total) by mouth daily. 08/02/24  Yes Tobie Suzzane POUR, MD   guaiFENesin -dextromethorphan  (ROBITUSSIN DM) 100-10 MG/5ML syrup Take 10 mLs by mouth every 8 (eight) hours as needed for cough. 06/26/24  Yes Johnson, Clanford L, MD  ipratropium-albuterol  (DUONEB) 0.5-2.5 (3) MG/3ML SOLN Take 3 mLs by nebulization every 6 (six) hours as needed. 07/22/24  Yes Tobie Suzzane POUR, MD  magnesium  oxide (MAG-OX) 400 (240 Mg) MG tablet Take 1 tablet (400 mg total) by mouth 2 (two) times daily. 07/12/24  Yes Tobie Suzzane POUR, MD  metoprolol  succinate (TOPROL  XL) 100 MG 24 hr tablet Take 1 tablet (100 mg total) by mouth daily. Take with or immediately following a meal. 06/24/24 08/02/24 Yes Shahmehdi, Adriana LABOR, MD  OXYGEN  Inhale 2 L into the lungs continuous. Hx of lung ca, copd, and emphysema   Yes [provider]  Palonosetron  HCl (ALOXI  IV) Inject into the vein.   Yes [provider]  rosuvastatin  (CRESTOR ) 40 MG tablet Take 1 tablet (40 mg total) by mouth daily. 09/03/23  Yes Lavona Agent, MD  tamsulosin  (FLOMAX ) 0.4 MG CAPS capsule Take 2 capsules (0.8 mg total) by mouth daily. 10/27/23  Yes Tobie Suzzane POUR, MD  warfarin (COUMADIN ) 5 MG tablet Take 0.5-1 tablets (2.5-5 mg total) by mouth See admin instructions. Take 2.5 mg by mouth Tuesday and Friday and take 5 mg on Monday, Wednesday, Thursday, Saturday and Sunday 06/28/24  Yes Johnson, Clanford L, MD  hydrOXYzine  (ATARAX ) 25 MG tablet Take 1 tablet (25 mg total) by mouth every 8 (eight) hours as needed for itching. Patient not taking: Reported on 08/02/2024 05/11/24   Tobie Suzzane POUR, MD                                                                                                                                    Past Surgical History Past Surgical History:  Procedure Laterality Date   CARDIOVERSION N/A 06/25/2024   Procedure: CARDIOVERSION;  Surgeon: Alvan Dorn FALCON, MD;  Location: [facility];  Service: Endoscopy;  Laterality: N/A;   HERNIA REPAIR Bilateral 1999   umbilical   IR IMAGING GUIDED PORT  INSERTION  12/09/2023   LAPAROSCOPIC APPENDECTOMY N/A 07/15/2021   Procedure: APPENDECTOMY LAPAROSCOPIC;  Surgeon: Kallie Manuelita BROCKS, MD;  Location: [facility];  Service: General;  Laterality: N/A;   VIDEO ASSISTED THORACOSCOPY Right 12/21/2018   Procedure: VIDEO ASSISTED THORACOSCOPY AND RESECTION OF BLEBS RIGHT LOWER LOBE;  Surgeon: Kerrin Elspeth BROCKS, MD;  Location: [facility];  Service: Thoracic;  Laterality: Right;   VIDEO ASSISTED THORACOSCOPY (VATS)/ LOBECTOMY Left 08/01/2016   Procedure: VIDEO ASSISTED THORACOSCOPY (VATS)/ LEFT UPPER LOBECTOMY with lymph node sampling and onQ placement;  Surgeon: Elspeth JAYSON Millers, MD;  Location: [facility];  Service: Thoracic;  Laterality: Left;   VIDEO ASSISTED THORACOSCOPY (VATS)/WEDGE RESECTION Right 12/09/2018   Procedure: VIDEO ASSISTED THORACOSCOPY (VATS)/WEDGE RESECTION;  Surgeon: Millers Elspeth JAYSON, MD;  Location: [facility] OR;  Service: Thoracic;  Laterality: Right;   VIDEO BRONCHOSCOPY Bilateral 12/21/2018   Procedure: VIDEO BRONCHOSCOPY;  Surgeon: Millers Elspeth JAYSON, MD;  Location: [facility];  Service: Thoracic;  Laterality: Bilateral;   VIDEO BRONCHOSCOPY WITH ENDOBRONCHIAL NAVIGATION N/A 07/03/2016   Procedure: VIDEO BRONCHOSCOPY WITH ENDOBRONCHIAL NAVIGATION;  Surgeon: Elspeth JAYSON Millers, MD;  Location: [facility];  Service: Thoracic;  Laterality: N/A;   VIDEO BRONCHOSCOPY WITH ENDOBRONCHIAL NAVIGATION N/A 10/24/2023   Procedure: VIDEO BRONCHOSCOPY WITH ENDOBRONCHIAL NAVIGATION;  Surgeon: Millers Elspeth JAYSON, MD;  Location: [facility];  Service: Thoracic;  Laterality: N/A;   VIDEO BRONCHOSCOPY WITH ENDOBRONCHIAL ULTRASOUND N/A 10/24/2023   Procedure: VIDEO BRONCHOSCOPY WITH ENDOBRONCHIAL ULTRASOUND;  Surgeon: Millers Elspeth JAYSON, MD;  Location: [facility];  Service: Thoracic;  Laterality: N/A;   VIDEO BRONCHOSCOPY WITH INSERTION OF INTERBRONCHIAL VALVE (IBV) N/A 12/11/2018   Procedure: VIDEO BRONCHOSCOPY WITH INSERTION OF INTERBRONCHIAL VALVE (IBV);  Surgeon:  Millers Elspeth JAYSON, MD;  Location: [facility] OR;  Service: Thoracic;  Laterality: N/A;   VIDEO BRONCHOSCOPY WITH INSERTION OF INTERBRONCHIAL VALVE (IBV) N/A 02/10/2019   Procedure: VIDEO BRONCHOSCOPY WITH REMOVAL OF INTERBRONCHIAL VALVE (IBV);  Surgeon: Millers Elspeth JAYSON, MD;  Location: [facility] OR;  Service: Thoracic;  Laterality: N/A;   Family History Family History  Problem Relation Age of Onset   Cancer Mother    Cancer Sister    Aneurysm Sister    Kidney disease Paternal Aunt    Asthma Brother    Cancer - Lung Brother    Cancer Brother    Arthritis Brother     Social History Social History   Tobacco Use   Smoking status: Former    Current packs/day: 0.00    Average packs/day: 1 pack/day for 40.0 years (40.0 ttl pk-yrs)    Types: Cigarettes    Start date: 05/28/1976    Quit date: 05/28/2016    Years since quitting: 8.1   Smokeless tobacco: Never  Vaping Use   Vaping status: Never Used  Substance Use Topics   Alcohol use: Not Currently    Comment: quit- 2016   Drug use: No   Allergies Patient has no known allergies.  Review of Systems A thorough review of systems was obtained and all systems are negative except as noted in the HPI and PMH.   Physical Exam Vital Signs  I have reviewed the triage vital signs BP 104/79   Pulse 70   Temp 97.8 F (36.6 C) (Oral)   Resp (!) 22   SpO2 97%  Physical Exam Vitals and nursing note reviewed.  Constitutional:      General: He is not in acute distress.    Appearance: Normal appearance. He is well-developed. He is not ill-appearing.  HENT:     Head: Normocephalic and atraumatic.     Right Ear: External ear normal.     Left Ear: External ear normal.     Nose: Nose normal.     Mouth/Throat:     Mouth: Mucous membranes are moist.  Eyes:     General: No  scleral icterus.       Right eye: No discharge.        Left eye: No discharge.  Cardiovascular:     Rate and Rhythm: Normal rate.  Pulmonary:     Effort: Pulmonary effort  is normal. No respiratory distress.     Breath sounds: No stridor. Decreased breath sounds present.  Abdominal:     General: Abdomen is flat. There is no distension.     Tenderness: There is no guarding.  Musculoskeletal:        General: No deformity.     Cervical back: No rigidity.  Skin:    General: Skin is warm and dry.     Coloration: Skin is not cyanotic, jaundiced or pale.  Neurological:     Mental Status: He is alert.  Psychiatric:        Speech: Speech normal.        Behavior: Behavior normal. Behavior is cooperative.     ED Results and Treatments Labs (all labs ordered are listed, but only abnormal results are displayed) Labs Reviewed  PROTIME-INR - Abnormal; Notable for the following components:      Result Value   Prothrombin Time 29.1 (*)    INR 2.6 (*)    All other components within normal limits  CBC WITH DIFFERENTIAL/PLATELET - Abnormal; Notable for the following components:   Hemoglobin 11.0 (*)    HCT 36.7 (*)    MCH 24.3 (*)    RDW 16.3 (*)    Lymphs Abs 0.4 (*)    Monocytes Absolute 1.4 (*)    All other components within normal limits  PRO BRAIN NATRIURETIC PEPTIDE - Abnormal; Notable for the following components:   Pro Brain Natriuretic Peptide 2,402.0 (*)    All other components within normal limits  TROPONIN T, HIGH SENSITIVITY - Abnormal; Notable for the following components:   Troponin T High Sensitivity 32 (*)    All other components within normal limits  TROPONIN T, HIGH SENSITIVITY - Abnormal; Notable for the following components:   Troponin T High Sensitivity 28 (*)    All other components within normal limits  RESP PANEL BY RT-PCR (RSV, FLU A&B, COVID)  RVPGX2  CULTURE, BLOOD (ROUTINE X 2)  CULTURE, BLOOD (ROUTINE X 2)  EXPECTORATED SPUTUM ASSESSMENT W GRAM STAIN, RFLX TO RESP C  COMPREHENSIVE METABOLIC PANEL WITH GFR  LIPASE, BLOOD  LACTIC ACID, PLASMA  LACTIC ACID, PLASMA                                                                                                                           Radiology CT ABDOMEN PELVIS W CONTRAST Result Date: 08/02/2024 CLINICAL DATA:  Two day history of epigastric pain and weakness EXAM: CT ABDOMEN AND PELVIS WITH CONTRAST TECHNIQUE: Multidetector CT imaging of the abdomen and pelvis was performed using the standard protocol following bolus administration of intravenous contrast. RADIATION DOSE REDUCTION: This exam was performed according to the departmental dose-optimization program  which includes automated exposure control, adjustment of the mA and/or kV according to patient size and/or use of iterative reconstruction technique. CONTRAST:  75mL OMNIPAQUE  IOHEXOL  350 MG/ML SOLN COMPARISON:  CT abdomen and pelvis dated 07/14/2021, ultrasound aorta dated 10/28/2023 FINDINGS: Lower chest: Please see separately dictated CTA chest report for detailed findings. Hepatobiliary: No focal hepatic lesions. No intra or extrahepatic biliary ductal dilation. Cholelithiasis Pancreas: No focal lesions or main ductal dilation. Spleen: Normal in size without focal abnormality. 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 No right adrenal nodule. Bilateral simple/minimally complicated cysts measuring up to 1.4 cm in the lower pole right kidney (10:34). No hydronephrosis or calculi. No focal bladder wall thickening. Stomach/Bowel: Normal appearance of the stomach. No evidence of bowel wall thickening, distention, or inflammatory changes. Colonic diverticulosis without acute diverticulitis. Postsurgical changes of appendectomy. Vascular/Lymphatic: Aortic atherosclerosis. Infrarenal abdominal aortic aneurysm measures 4.5 x 4.4 cm, not substantially changed from 10/28/2023. No enlarged abdominal or pelvic lymph nodes. Reproductive: Prostate is unremarkable. Other: No free fluid, fluid collection, or free air. Musculoskeletal: No acute or abnormal lytic or blastic osseous lesions. Small fat-containing bilateral  inguinal hernias. IMPRESSION: 1. No acute abdominopelvic findings. 2. Cholelithiasis without evidence of acute cholecystitis. 3. Infrarenal abdominal aortic aneurysm measures 4.5 x 4.4 cm, not substantially changed from 10/28/2023. Recommend follow-up CT or MR as appropriate in 12 months and referral to or continued care with vascular specialist. (Ref.: J Vasc Surg. 2018; 67:2-77 and J Am Coll Radiol 2013;10(10):789-794.) 4.  Aortic Atherosclerosis (ICD10-I70.0). Electronically Signed   By: Limin  Xu M.D.   On: 08/02/2024 13:29   CT Angio Chest PE W and/or Wo Contrast Result Date: 08/02/2024 CLINICAL DATA:  Two day history of central chest pain and shortness of breath. History of recurrent right lung adenocarcinoma. EXAM: CT ANGIOGRAPHY CHEST WITH CONTRAST TECHNIQUE: Multidetector CT imaging of the chest was performed using the standard protocol during bolus administration of intravenous contrast. Multiplanar CT image reconstructions and MIPs were obtained to evaluate the vascular anatomy. RADIATION DOSE REDUCTION: This exam was performed according to the departmental dose-optimization program which includes automated exposure control, adjustment of the mA and/or kV according to patient size and/or use of iterative reconstruction technique. CONTRAST:  75mL OMNIPAQUE  IOHEXOL  350 MG/ML SOLN COMPARISON:  Same day chest radiograph, CT chest dated 07/30/2024 FINDINGS: Cardiovascular: Partially imaged left chest wall port tip terminates in the right atrium. The study is high quality for the evaluation of pulmonary embolism. There are no filling defects in the central, lobar, segmental or subsegmental pulmonary artery branches to suggest acute pulmonary embolism. Great vessels are normal in course and caliber. Normal heart size. No significant pericardial fluid/thickening. Coronary artery calcifications and aortic atherosclerosis. Mediastinum/Nodes: Imaged thyroid  gland without nodules meeting criteria for imaging  follow-up by size. Mildly patulous esophagus. No pathologically enlarged axillary, supraclavicular, mediastinal, or hilar lymph nodes. Lungs/Pleura: The central airways are patent. Severe centrilobular and paraseptal emphysema. Postsurgical changes of left upper lobectomy and right upper lobe wedge resection. Similar consolidation and architectural distortion related to prior radiation treatment of the right lung. No substantial change in size of right lower lobe nodules measuring 9 x 6 mm (lung series 4, uploaded to the CT abdomen pelvis accession image 94) and 10 x 6 mm (4:96). Interval development of right upper lobe peripheral consolidation and ground-glass opacity (4:37) and scattered patchy ground-glass opacities in the posterior left lower lobe and peripheral right lower lobe. No pneumothorax. Unchanged loculated pleural effusion in the right major fissure.  Increased trace right pleural effusion. Upper abdomen: Please see separately dictated CT abdomen and pelvis report for detailed findings. Musculoskeletal: No acute or abnormal lytic or blastic osseous lesions. Multilevel degenerative changes of the thoracic spine. Review of the MIP images confirms the above findings. IMPRESSION: 1. No evidence of acute pulmonary embolism. 2. Interval development of right upper lobe peripheral consolidation and scattered patchy ground-glass opacities in the posterior left lower lobe and peripheral right lower lobe, suspicious for multifocal pneumonia. 3. No substantial change in size of right lower lobe nodules measuring up to 10 mm. 4. Increased trace right pleural effusion. 5. Aortic Atherosclerosis (ICD10-I70.0) and Emphysema (ICD10-J43.9). Coronary artery calcifications. Assessment for potential risk factor modification, dietary therapy or pharmacologic therapy may be warranted, if clinically indicated. Electronically Signed   By: Limin  Xu M.D.   On: 08/02/2024 13:20   DG Chest Port 1 View Result Date:  08/02/2024 EXAM: 1 VIEW(S) XRAY OF THE CHEST 08/02/2024 07:55:00 AM COMPARISON: 06/21/24 CLINICAL HISTORY: cp dib FINDINGS: LINES, TUBES AND DEVICES: Left chest port with tip in distal Superior Cava. LUNGS AND PLEURA: Right lung surgical suture chains. Right upper and right lower lobes pulmonary opacities have improved in the interval. Emphysema. Small right pleural effusion. No pulmonary edema. No pneumothorax. HEART AND MEDIASTINUM: Aortic calcification. No acute abnormality of the cardiac and mediastinal silhouettes. BONES AND SOFT TISSUES: No acute osseous abnormality. IMPRESSION: 1. Mildly increased septal markings within the periphery of the right base concerning for asymmetric edema versus chronic postinflammatory change . 2. Small loculated right pleural effusion with fluid trapped along the minor fissure, similar to the previous exam. 3. Resolution of previous right upper and right lower lobe airspace opacities. Electronically signed by: Waddell Calk MD 08/02/2024 08:09 AM EST RP Workstation: [id number]    Pertinent labs & imaging results that were available during my care of the patient were reviewed by me and considered in my medical decision making (see MDM for details).  Medications Ordered in ED Medications  cefTRIAXone  (ROCEPHIN ) 1 g in sodium chloride  0.9 % 100 mL IVPB (has no administration in time range)  azithromycin  (ZITHROMAX ) 500 mg in sodium chloride  0.9 % 250 mL IVPB (has no administration in time range)  furosemide  (LASIX ) injection 40 mg (has no administration in time range)  iohexol  (OMNIPAQUE ) 350 MG/ML injection 75 mL (75 mLs Intravenous Contrast Given 08/02/24 1103)                                                                                                                                     Procedures .Critical Care  Performed by: Elnor Jayson LABOR, DO Authorized by: Elnor Jayson LABOR, DO   Critical care provider statement:    Critical care time (minutes):  45    Critical care time was exclusive of:  Separately billable procedures and treating other patients   Critical care was necessary to treat or prevent imminent or life-threatening  deterioration of the following conditions:  Sepsis and respiratory failure   Critical care was time spent personally by me on the following activities:  Development of treatment plan with patient or surrogate, discussions with consultants, evaluation of patient's response to treatment, examination of patient, ordering and review of laboratory studies, ordering and review of radiographic studies, ordering and performing treatments and interventions, pulse oximetry, re-evaluation of patient's condition, review of old charts and obtaining history from patient or surrogate   Care discussed with: admitting provider     (including critical care time)  Medical Decision Making / ED Course    Medical Decision Making:    Randall Sullivan is a 73 y.o. male with past medical history as below, significant for atrial fibrillation on warfarin, AAA, emphysema/COPD, hyperlipidemia who presents to the ED with complaint of dyspnea, lower chest pain. The complaint involves an extensive differential diagnosis and also carries with it a high risk of complications and morbidity.  Serious etiology was considered. Ddx includes but is not limited to: Differential includes all life-threatening causes for chest pain. This includes but is not exclusive to acute coronary syndrome, aortic dissection, pulmonary embolism, cardiac tamponade, community-acquired pneumonia, pericarditis, musculoskeletal chest wall pain, etc.   Complete initial physical exam performed, notably the patient was in no acute distress.    Reviewed and confirmed nursing documentation for past medical history, family history, social history.  Vital signs reviewed.    Lower chest pain, epigastric pain Dyspnea Fatigue Multifocal pna> - Symptoms ongoing over the past 2 days, exertional  dyspnea, lower chest, epigastric pain.  Poor p.o., generalized weakness.  Not currently having any chest pain - BNP is 2400, troponin 32 > 28 (favor demand ischemia in setting of PNA), no significant lower extremity edema or current chest pain.  Chest x-ray concerning for chronic loculated effusion, asymmetric edema > CT pending  - CT PE revealing for multifocal pna, sepsis bundle - CTAP stable, cholelithiasis  Acute CHF> - Echocardiogram 9/25 with LVEF 60 to 65% - BNP is elevated, he does not appear overtly volume overloaded.   - pt with right pleural eff - hypoxia  - Will give dose of Lasix    Clinical Course as of 08/02/24 1410  Mon Aug 02, 2024  1327 CT concern for multifocal pneumonia.  Patient meets for sepsis with 2 SIRS including tachypnea and tachycardia.  Does not appear to be in septic shock as he is hemodynamically stable.  Blood cultures ordered, sputum culture, broad-spectrum antibiotics.  Plan admission [SG]    Clinical Course User Index [SG] Elnor Savant A, DO     Symptoms today likely secondary to multifocal pneumonia, sepsis.  He is hemodynamically stable.  Plan admission for sepsis due to pneumonia.  Patient and family are agreeable               Additional history obtained: -Additional history obtained from family -External records from outside source obtained and reviewed including: Chart review including previous notes, labs, imaging, consultation notes including  Primary care office notes , home Medications   Lab Tests: -I ordered, reviewed, and interpreted labs.   The pertinent results include:   Labs Reviewed  PROTIME-INR - Abnormal; Notable for the following components:      Result Value   Prothrombin Time 29.1 (*)    INR 2.6 (*)    All other components within normal limits  CBC WITH DIFFERENTIAL/PLATELET - Abnormal; Notable for the following components:   Hemoglobin 11.0 (*)    HCT  36.7 (*)    MCH 24.3 (*)    RDW 16.3 (*)    Lymphs  Abs 0.4 (*)    Monocytes Absolute 1.4 (*)    All other components within normal limits  PRO BRAIN NATRIURETIC PEPTIDE - Abnormal; Notable for the following components:   Pro Brain Natriuretic Peptide 2,402.0 (*)    All other components within normal limits  TROPONIN T, HIGH SENSITIVITY - Abnormal; Notable for the following components:   Troponin T High Sensitivity 32 (*)    All other components within normal limits  TROPONIN T, HIGH SENSITIVITY - Abnormal; Notable for the following components:   Troponin T High Sensitivity 28 (*)    All other components within normal limits  RESP PANEL BY RT-PCR (RSV, FLU A&B, COVID)  RVPGX2  CULTURE, BLOOD (ROUTINE X 2)  CULTURE, BLOOD (ROUTINE X 2)  EXPECTORATED SPUTUM ASSESSMENT W GRAM STAIN, RFLX TO RESP C  COMPREHENSIVE METABOLIC PANEL WITH GFR  LIPASE, BLOOD  LACTIC ACID, PLASMA  LACTIC ACID, PLASMA    Notable for as above  EKG   EKG Interpretation Date/Time:  Monday August 02 2024 07:45:12 EST Ventricular Rate:  72 PR Interval:    QRS Duration:  91 QT Interval:  387 QTC Calculation: 424 R Axis:   81  Text Interpretation: Atrial fibrillation Borderline right axis deviation Confirmed by Elnor Savant ([id number]) on 08/02/2024 8:02:26 AM         Imaging Studies ordered: I ordered imaging studies including CTPE CTAP I independently visualized the following imaging with scope of interpretation limited to determining acute life threatening conditions related to emergency care; findings noted above I agree with the radiologist interpretation If any imaging was obtained with contrast I closely monitored patient for any possible adverse reaction a/w contrast administration in the emergency department   Medicines ordered and prescription drug management: Meds ordered this encounter  Medications   iohexol  (OMNIPAQUE ) 350 MG/ML injection 75 mL   cefTRIAXone  (ROCEPHIN ) 1 g in sodium chloride  0.9 % 100 mL IVPB    Antibiotic Indication::   CAP    azithromycin  (ZITHROMAX ) 500 mg in sodium chloride  0.9 % 250 mL IVPB    Antibiotic Indication::   CAP   furosemide  (LASIX ) injection 40 mg    -I have reviewed the patients home medicines and have made adjustments as needed   Consultations Obtained: I requested consultation with the [facility],  and discussed lab and imaging findings as well as pertinent plan    Cardiac Monitoring: The patient was maintained on a cardiac monitor.  I personally viewed and interpreted the cardiac monitored which showed an underlying rhythm of: NSR Continuous pulse oximetry interpreted by myself, 97% on [place].    Social Determinants of Health:  Diagnosis or treatment significantly limited by social determinants of health: former smoker and obesity   Reevaluation: After the interventions noted above, I reevaluated the patient and found that they have improved  Co morbidities that complicate the patient evaluation  Past Medical History:  Diagnosis Date   A-fib (HCC) 2017   after last lung surgery patient had a history if Afib documented in hospital    AAA (abdominal aortic aneurysm)    4.0 cm 09/17/22 US    Acute appendicitis 07/14/2021   Arthritis    back & legs ( knees)   Cancer (HCC)    lung   Complication of anesthesia    agitated upon waking from anesth.    Dyspnea    Dysrhythmia    A.  Fib   Emphysema    Emphysema of lung (HCC)    History of kidney stones    Hyperlipidemia    Hypertension    Oxygen  dependent    2L continuous   Peripheral vascular disease    Aortic Aneurysm   Pneumonia    Pre-diabetes    Sleep apnea    No OSA, but wears 2L [place] O2 continuous including at night   Stroke [facility]) 2022   Right sided weakness, decreased hearing, balance and memory loss   Tobacco abuse       Dispostion: Disposition decision including need for hospitalization was considered, and patient admitted to the hospital.    Final Clinical Impression(s) / ED Diagnoses Final diagnoses:  Multifocal  pneumonia  Pleural effusion  Aortic atherosclerosis  Abdominal aortic aneurysm (AAA) without rupture, unspecified part  Acute congestive heart failure, unspecified heart failure type (HCC)  Sepsis, due to unspecified organism, unspecified whether acute organ dysfunction present (HCC)        Elnor Jayson LABOR, DO 08/02/24 1410

## 2024-08-03 DIAGNOSIS — J9611 Chronic respiratory failure with hypoxia: Secondary | ICD-10-CM | POA: Diagnosis not present

## 2024-08-03 DIAGNOSIS — J181 Lobar pneumonia, unspecified organism: Secondary | ICD-10-CM | POA: Diagnosis not present

## 2024-08-03 DIAGNOSIS — C3490 Malignant neoplasm of unspecified part of unspecified bronchus or lung: Secondary | ICD-10-CM | POA: Diagnosis not present

## 2024-08-03 DIAGNOSIS — A419 Sepsis, unspecified organism: Secondary | ICD-10-CM | POA: Diagnosis not present

## 2024-08-03 LAB — BASIC METABOLIC PANEL WITH GFR
Anion gap: 8 (ref 5–15)
BUN: 19 mg/dL (ref 8–23)
CO2: 32 mmol/L (ref 22–32)
Calcium: 8.9 mg/dL (ref 8.9–10.3)
Chloride: 100 mmol/L (ref 98–111)
Creatinine, Ser: 0.82 mg/dL (ref 0.61–1.24)
GFR, Estimated: >60 mL/min (ref >60)
Glucose, Bld: 154 mg/dL — ABNORMAL HIGH (ref 70–99)
Potassium: 4.1 mmol/L (ref 3.5–5.1)
Sodium: 140 mmol/L (ref 135–145)

## 2024-08-03 LAB — GLUCOSE, CAPILLARY: Glucose-Capillary: 131 mg/dL — ABNORMAL HIGH (ref 70–99)

## 2024-08-03 LAB — CBC
HCT: 33.9 % — ABNORMAL LOW (ref 39.0–52.0)
Hemoglobin: 10.3 g/dL — ABNORMAL LOW (ref 13.0–17.0)
MCH: 24.3 pg — ABNORMAL LOW (ref 26.0–34.0)
MCHC: 30.4 g/dL (ref 30.0–36.0)
MCV: 80 fL (ref 80.0–100.0)
Platelets: 205 K/uL (ref 150–400)
RBC: 4.24 MIL/uL (ref 4.22–5.81)
RDW: 16.2 % — ABNORMAL HIGH (ref 11.5–15.5)
WBC: 5.5 K/uL (ref 4.0–10.5)
nRBC: 0 % (ref 0.0–0.2)

## 2024-08-03 LAB — RESPIRATORY PANEL BY PCR

## 2024-08-03 LAB — PROTIME-INR
INR: 2.9 — ABNORMAL HIGH (ref 0.8–1.2)
Prothrombin Time: 32.1 s — ABNORMAL HIGH (ref 11.4–15.2)

## 2024-08-03 LAB — MAGNESIUM: Magnesium: 2.1 mg/dL (ref 1.7–2.4)

## 2024-08-03 MED ORDER — SODIUM CHLORIDE 0.9% FLUSH: 10.0000 mL | INTRAVENOUS | Status: DC | PRN

## 2024-08-03 MED ORDER — SODIUM CHLORIDE 0.9% FLUSH
10.0000 mL | Freq: Two times a day (BID) | INTRAVENOUS | Status: DC
  Administered 2024-08-03 – 2024-08-05 (×5): 10 mL

## 2024-08-03 MED ORDER — HYDROCODONE BIT-HOMATROP MBR 5-1.5 MG/5ML PO SOLN
5.0000 mL | ORAL | Status: DC | PRN
  Administered 2024-08-03 – 2024-08-04 (×3): 5 mL via ORAL
  Filled 2024-08-03 (×3): qty 5

## 2024-08-03 MED ORDER — WARFARIN SODIUM 2.5 MG PO TABS
2.5000 mg | ORAL_TABLET | Freq: Once | ORAL | Status: AC
  Administered 2024-08-03: 2.5 mg via ORAL
  Filled 2024-08-03: qty 1

## 2024-08-03 MED ORDER — CHLORHEXIDINE GLUCONATE CLOTH 2 % EX PADS
6.0000 | MEDICATED_PAD | Freq: Every day | CUTANEOUS | Status: DC
  Administered 2024-08-03 – 2024-08-04 (×2): 6 via TOPICAL

## 2024-08-03 NOTE — Progress Notes (Addendum)
 PROGRESS NOTE  Randall Sullivan FMW:[id number] DOB: 09-Mar-1951 DOA: 08/02/2024 PCP: Tobie Suzzane POUR, MD  Brief History:  73 year old male with a history of non-small cell lung cancer, COPD, chronic respiratory failure on 2 L, proximal atrial fibrillation, AAA, hypertension, hyperlipidemia, prediabetes, BPH, CVA 02/2021 presenting with 2-day history of shortness of breath, coughing, and generalized weakness.  The patient has had some subjective fevers and chills.  He denies any headache, neck pain, hemoptysis, nausea, vomiting, diarrhea.  The patient did have some lower chest pain worsening with coughing.  He denies any dysuria, hematochezia, melena, hematuria.  He denies any increasing abdominal girth or lower extremity edema worsening.  He states that his weight is actually decreased in the past 2 to 3 weeks.  He has had decreased oral intake.  Notably, the patient was hospitalized from 06/21/2024 to 06/26/2024 for pneumonia and COPD exacerbation.  The patient developed atrial fibrillation with RVR at that time and required DCCV on 06/25/2024.  He was discharged home with metoprolol  100 mg daily, diltiazem  CD1 120 mg daily and continued on warfarin.  He was discharged with levofloxacin .  In the ED, the patient was afebrile and hemodynamically stable with oxygen  saturation 97% on 3 L.  WBC 8.7, hemoglobin 1.0, platelets 220.  Sodium 137, potassium 4.2, bicarbonate 31, serum creatinine 0.80.  LFTs were unremarkable.  CTA chest was negative for PE.  Showed interval development of RUL peripheral consolidation; scattered GGO LLL and RLL.  There is unchanged loculated effusion on the right major fissure and trace right pleural effusion.  CT abdomen pelvis was negative for any acute findings.  He had cholelithiasis without cholecystitis.  There was an unchanged infrarenal aortic aneurysm measuring 4.5 x 4.4 cm.  There is no hydronephrosis.  The patient was started on ceftriaxone  and azithromycin .  He was given  furosemide  40 mg IV x 1.   Assessment/Plan: Sepsis - Present on admission - Presented with tachycardia and tachypnea - Secondary to pneumonia - Check PCT - Lactic acid 0.9 -COVID/RSV/Flu -neg -viral respiratory panel--neg - blood culture neg to date - sepsis physiology resolved  Lobar pneumonia -08/02/2024 CTA chest as discussed above - Continue ceftriaxone  - Start doxycycline  - Check PCT <0.10 - Urine Legionella antigen--not collected - Urine Streptococcus pneumoniae antigen--not collected - MRSA screen   COPD exacerbation - Started Brovana  - Started Pulmicort  - Continue IV Solu-Medrol  - Continue DuoNebs   Chronic respiratory failure with hypoxia - Chronically on 2 L   Recurrent non-small cell lung cancer--stage III -S/p chemoradiation with carboplatin  and paclitaxel . Currently on consolidation durvalumab .  -left VATS in 2017 with lobectomy and lymph node resection  -right VATS with right upper lobe nodule and bleb resection in 2020,    Elevated troponin - Not consistent with ACS - Troponin 32>> 28   Diabetes mellitus type 2, controlled - Currently off any agents - 05/25/2024 hemoglobin A1c 5.4   Infrarenal AAA - Unchanged on 08/02/24 CTA - Continue outpatient surveillance   Mixed hyperlipidemia - Continue statin   BPH - Continue tamsulosin    Essential hypertension - continue metoprolol  succinate and diltiazem  CD - controlled       Family Communication:   wife at bedside 11/4  Consultants:  none  Code Status:  FULL   DVT Prophylaxis:  warfarin   Procedures: As Listed in Progress Note Above  Antibiotics: Ceftriaxone  11/3>> Doxy 11/3>>      Subjective: Pt states he is breathing better today.  Has dry cough.  Denies hemoptysis, n/v/d, abd pain  Objective: Vitals:   08/03/24 0806 08/03/24 1148 08/03/24 1230 08/03/24 1425  BP: 122/67   110/66  Pulse: 85   87  Resp:      Temp:    98 F (36.7 C)  TempSrc:    Oral  SpO2: 100%  98%  97%  Weight:  93.4 kg    Height:  5' 9 (1.753 m)      Intake/Output Summary (Last 24 hours) at 08/03/2024 1634 Last data filed at 08/03/2024 0525 Gross per 24 hour  Intake 339.25 ml  Output --  Net 339.25 ml   Weight change:  Exam:  General:  Pt is alert, follows commands appropriately, not in acute distress HEENT: No icterus, No thrush, No neck mass, [place]/AT Cardiovascular: RRR, S1/S2, no rubs, no gallops Respiratory: diminished BS.  Bibasilar rales.  Mild Bibasilar wheeze Abdomen: Soft/+BS, non tender, non distended, no guarding Extremities: No edema, No lymphangitis, No petechiae, No rashes, no synovitis   Data Reviewed: I have personally reviewed following labs and imaging studies Basic Metabolic Panel: Recent Labs  Lab 08/02/24 0805 08/03/24 0406  NA 137 140  K 4.2 4.1  CL 100 100  CO2 31 32  GLUCOSE 98 154*  BUN 16 19  CREATININE 0.80 0.82  CALCIUM  9.1 8.9  MG  --  2.1   Liver Function Tests: Recent Labs  Lab 08/02/24 0805  AST 16  ALT 13  ALKPHOS 44  BILITOT 0.9  PROT 6.7  ALBUMIN  4.1   Recent Labs  Lab 08/02/24 0803  LIPASE 16   No results for input(s): AMMONIA in the last 168 hours. Coagulation Profile: Recent Labs  Lab 07/28/24 0000 08/02/24 0805 08/03/24 0406  INR 2.8 2.6* 2.9*   CBC: Recent Labs  Lab 08/02/24 0805 08/03/24 0406  WBC 8.7 5.5  NEUTROABS 6.8  --   HGB 11.0* 10.3*  HCT 36.7* 33.9*  MCV 81.2 80.0  PLT 220 205   Cardiac Enzymes: No results for input(s): CKTOTAL, CKMB, CKMBINDEX, TROPONINI in the last 168 hours. BNP: Invalid input(s): POCBNP CBG: Recent Labs  Lab 08/02/24 1651 08/02/24 1952 08/03/24 0732  GLUCAP 126* 107* 131*   HbA1C: No results for input(s): HGBA1C in the last 72 hours. Urine analysis:    Component Value Date/Time   COLORURINE AMBER (A) 06/21/2024 1536   APPEARANCEUR HAZY (A) 06/21/2024 1536   LABSPEC 1.025 06/21/2024 1536   PHURINE 5.0 06/21/2024 1536   GLUCOSEU  NEGATIVE 06/21/2024 1536   HGBUR NEGATIVE 06/21/2024 1536   BILIRUBINUR NEGATIVE 06/21/2024 1536   KETONESUR NEGATIVE 06/21/2024 1536   PROTEINUR NEGATIVE 06/21/2024 1536   UROBILINOGEN 0.2 10/04/2011 1026   NITRITE NEGATIVE 06/21/2024 1536   LEUKOCYTESUR NEGATIVE 06/21/2024 1536   Sepsis Labs: @LABRCNTIP (procalcitonin:4,lacticidven:4) ) Recent Results (from the past 240 hours)  Resp panel by RT-PCR (RSV, Flu A&B, Covid) Anterior Nasal Swab     Status: None   Collection Time: 08/02/24  7:56 AM   Specimen: Anterior Nasal Swab  Result Value Ref Range Status   SARS Coronavirus 2 by RT PCR NEGATIVE NEGATIVE Final    Comment: (NOTE) SARS-CoV-2 target nucleic acids are NOT DETECTED.  The SARS-CoV-2 RNA is generally detectable in upper respiratory specimens during the acute phase of infection. The lowest concentration of SARS-CoV-2 viral copies this assay can detect is 138 copies/mL. A negative result does not preclude SARS-Cov-2 infection and should not be used as the sole basis for treatment or  other patient management decisions. A negative result may occur with  improper specimen collection/handling, submission of specimen other than nasopharyngeal swab, presence of viral mutation(s) within the areas targeted by this assay, and inadequate number of viral copies(<138 copies/mL). A negative result must be combined with clinical observations, patient history, and epidemiological information. The expected result is Negative.  Fact Sheet for Patients:  bloggercourse.com  Fact Sheet for Healthcare Providers:  seriousbroker.it  This test is no t yet approved or cleared by the [place]  FDA and  has been authorized for detection and/or diagnosis of SARS-CoV-2 by FDA under an Emergency Use Authorization (EUA). This EUA will remain  in effect (meaning this test can be used) for the duration of the COVID-19 declaration under Section  564(b)(1) of the Act, 21 U.S.C.section 360bbb-3(b)(1), unless the authorization is terminated  or revoked sooner.       Influenza A by PCR NEGATIVE NEGATIVE Final   Influenza B by PCR NEGATIVE NEGATIVE Final    Comment: (NOTE) The Xpert Xpress SARS-CoV-2/FLU/RSV plus assay is intended as an aid in the diagnosis of influenza from Nasopharyngeal swab specimens and should not be used as a sole basis for treatment. Nasal washings and aspirates are unacceptable for Xpert Xpress SARS-CoV-2/FLU/RSV testing.  Fact Sheet for Patients: bloggercourse.com  Fact Sheet for Healthcare Providers: seriousbroker.it  This test is not yet approved or cleared by the [place]  FDA and has been authorized for detection and/or diagnosis of SARS-CoV-2 by FDA under an Emergency Use Authorization (EUA). This EUA will remain in effect (meaning this test can be used) for the duration of the COVID-19 declaration under Section 564(b)(1) of the Act, 21 U.S.C. section 360bbb-3(b)(1), unless the authorization is terminated or revoked.     Resp Syncytial Virus by PCR NEGATIVE NEGATIVE Final    Comment: (NOTE) Fact Sheet for Patients: bloggercourse.com  Fact Sheet for Healthcare Providers: seriousbroker.it  This test is not yet approved or cleared by the [place]  FDA and has been authorized for detection and/or diagnosis of SARS-CoV-2 by FDA under an Emergency Use Authorization (EUA). This EUA will remain in effect (meaning this test can be used) for the duration of the COVID-19 declaration under Section 564(b)(1) of the Act, 21 U.S.C. section 360bbb-3(b)(1), unless the authorization is terminated or revoked.  Performed at [facility], [address] [place], [place] [address]   Blood culture (routine x 2)     Status: None (Preliminary result)   Collection Time: 08/02/24  1:43 PM   Specimen:  BLOOD  Result Value Ref Range Status   Specimen Description BLOOD RIGHT ANTECUBITAL  Final   Special Requests   Final    BOTTLES DRAWN AEROBIC AND ANAEROBIC Blood Culture adequate volume   Culture   Final    NO GROWTH < 24 HOURS Performed at [facility], [address], [place], [place] [address]    Report Status PENDING  Incomplete  Blood culture (routine x 2)     Status: None (Preliminary result)   Collection Time: 08/02/24  1:54 PM   Specimen: BLOOD  Result Value Ref Range Status   Specimen Description BLOOD BLOOD RIGHT HAND  Final   Special Requests   Final    Blood Culture adequate volume BOTTLES DRAWN AEROBIC AND ANAEROBIC   Culture   Final    NO GROWTH < 24 HOURS Performed at [facility], [address] [place], [place] [address]    Report Status PENDING  Incomplete  Respiratory (~20 pathogens) panel by  PCR     Status: None   Collection Time: 08/02/24  3:34 PM   Specimen: Nasopharyngeal Swab; Respiratory  Result Value Ref Range Status   Adenovirus NOT DETECTED NOT DETECTED Final   Coronavirus 229E NOT DETECTED NOT DETECTED Final    Comment: (NOTE) The Coronavirus on the Respiratory Panel, DOES NOT test for the novel  Coronavirus (2019 nCoV)    Coronavirus HKU1 NOT DETECTED NOT DETECTED Final   Coronavirus NL63 NOT DETECTED NOT DETECTED Final   Coronavirus OC43 NOT DETECTED NOT DETECTED Final   Metapneumovirus NOT DETECTED NOT DETECTED Final   Rhinovirus / Enterovirus NOT DETECTED NOT DETECTED Final   Influenza A NOT DETECTED NOT DETECTED Final   Influenza B NOT DETECTED NOT DETECTED Final   Parainfluenza Virus 1 NOT DETECTED NOT DETECTED Final   Parainfluenza Virus 2 NOT DETECTED NOT DETECTED Final   Parainfluenza Virus 3 NOT DETECTED NOT DETECTED Final   Parainfluenza Virus 4 NOT DETECTED NOT DETECTED Final   Respiratory Syncytial Virus NOT DETECTED NOT DETECTED Final   Bordetella pertussis NOT DETECTED NOT DETECTED Final   Bordetella Parapertussis NOT DETECTED  NOT DETECTED Final   Chlamydophila pneumoniae NOT DETECTED NOT DETECTED Final   Mycoplasma pneumoniae NOT DETECTED NOT DETECTED Final    Comment: Performed at [facility], [address] [place], [place] [address]     Scheduled Meds:  arformoterol   15 mcg Nebulization BID   budesonide  (PULMICORT ) nebulizer solution  0.5 mg Nebulization BID   Chlorhexidine  Gluconate Cloth  6 each Topical Daily   diltiazem   120 mg Oral Daily   doxycycline   100 mg Oral Q12H   fenofibrate   160 mg Oral Daily   finasteride   5 mg Oral Daily   ipratropium-albuterol   3 mL Nebulization Q6H   loratadine   10 mg Oral Daily   magnesium  oxide  400 mg Oral BID   methylPREDNISolone  (SOLU-MEDROL ) injection  40 mg Intravenous Q12H   metoprolol  succinate  100 mg Oral Daily   rosuvastatin   40 mg Oral Daily   sodium chloride  flush  10-40 mL Intracatheter Q12H   Warfarin - Pharmacist Dosing Inpatient   Does not apply q1600   Continuous Infusions:  cefTRIAXone  (ROCEPHIN )  IV 1 g (08/03/24 0819)    Procedures/Studies: CT ABDOMEN PELVIS W CONTRAST Result Date: 08/02/2024 CLINICAL DATA:  Two day history of epigastric pain and weakness EXAM: CT ABDOMEN AND PELVIS WITH CONTRAST TECHNIQUE: Multidetector CT imaging of the abdomen and pelvis was performed using the standard protocol following bolus administration of intravenous contrast. RADIATION DOSE REDUCTION: This exam was performed according to the departmental dose-optimization program which includes automated exposure control, adjustment of the mA and/or kV according to patient size and/or use of iterative reconstruction technique. CONTRAST:  75mL OMNIPAQUE  IOHEXOL  350 MG/ML SOLN COMPARISON:  CT abdomen and pelvis dated 07/14/2021, ultrasound aorta dated 10/28/2023 FINDINGS: Lower chest: Please see separately dictated CTA chest report for detailed findings. Hepatobiliary: No focal hepatic lesions. No intra or extrahepatic biliary ductal dilation. Cholelithiasis Pancreas:  No focal lesions or main ductal dilation. Spleen: Normal in size without focal abnormality. 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 No right adrenal nodule. Bilateral simple/minimally complicated cysts measuring up to 1.4 cm in the lower pole right kidney (10:34). No hydronephrosis or calculi. No focal bladder wall thickening. Stomach/Bowel: Normal appearance of the stomach. No evidence of bowel wall thickening, distention, or inflammatory changes. Colonic diverticulosis without acute diverticulitis. Postsurgical changes of appendectomy. Vascular/Lymphatic: Aortic atherosclerosis. Infrarenal  abdominal aortic aneurysm measures 4.5 x 4.4 cm, not substantially changed from 10/28/2023. No enlarged abdominal or pelvic lymph nodes. Reproductive: Prostate is unremarkable. Other: No free fluid, fluid collection, or free air. Musculoskeletal: No acute or abnormal lytic or blastic osseous lesions. Small fat-containing bilateral inguinal hernias. IMPRESSION: 1. No acute abdominopelvic findings. 2. Cholelithiasis without evidence of acute cholecystitis. 3. Infrarenal abdominal aortic aneurysm measures 4.5 x 4.4 cm, not substantially changed from 10/28/2023. Recommend follow-up CT or MR as appropriate in 12 months and referral to or continued care with vascular specialist. (Ref.: J Vasc Surg. 2018; 67:2-77 and J Am Coll Radiol 2013;10(10):789-794.) 4.  Aortic Atherosclerosis (ICD10-I70.0). Electronically Signed   By: Limin  Xu M.D.   On: 08/02/2024 13:29   CT Angio Chest PE W and/or Wo Contrast Result Date: 08/02/2024 CLINICAL DATA:  Two day history of central chest pain and shortness of breath. History of recurrent right lung adenocarcinoma. EXAM: CT ANGIOGRAPHY CHEST WITH CONTRAST TECHNIQUE: Multidetector CT imaging of the chest was performed using the standard protocol during bolus administration of intravenous contrast. Multiplanar CT image reconstructions and MIPs were obtained to evaluate the  vascular anatomy. RADIATION DOSE REDUCTION: This exam was performed according to the departmental dose-optimization program which includes automated exposure control, adjustment of the mA and/or kV according to patient size and/or use of iterative reconstruction technique. CONTRAST:  75mL OMNIPAQUE  IOHEXOL  350 MG/ML SOLN COMPARISON:  Same day chest radiograph, CT chest dated 07/30/2024 FINDINGS: Cardiovascular: Partially imaged left chest wall port tip terminates in the right atrium. The study is high quality for the evaluation of pulmonary embolism. There are no filling defects in the central, lobar, segmental or subsegmental pulmonary artery branches to suggest acute pulmonary embolism. Great vessels are normal in course and caliber. Normal heart size. No significant pericardial fluid/thickening. Coronary artery calcifications and aortic atherosclerosis. Mediastinum/Nodes: Imaged thyroid  gland without nodules meeting criteria for imaging follow-up by size. Mildly patulous esophagus. No pathologically enlarged axillary, supraclavicular, mediastinal, or hilar lymph nodes. Lungs/Pleura: The central airways are patent. Severe centrilobular and paraseptal emphysema. Postsurgical changes of left upper lobectomy and right upper lobe wedge resection. Similar consolidation and architectural distortion related to prior radiation treatment of the right lung. No substantial change in size of right lower lobe nodules measuring 9 x 6 mm (lung series 4, uploaded to the CT abdomen pelvis accession image 94) and 10 x 6 mm (4:96). Interval development of right upper lobe peripheral consolidation and ground-glass opacity (4:37) and scattered patchy ground-glass opacities in the posterior left lower lobe and peripheral right lower lobe. No pneumothorax. Unchanged loculated pleural effusion in the right major fissure. Increased trace right pleural effusion. Upper abdomen: Please see separately dictated CT abdomen and pelvis report for  detailed findings. Musculoskeletal: No acute or abnormal lytic or blastic osseous lesions. Multilevel degenerative changes of the thoracic spine. Review of the MIP images confirms the above findings. IMPRESSION: 1. No evidence of acute pulmonary embolism. 2. Interval development of right upper lobe peripheral consolidation and scattered patchy ground-glass opacities in the posterior left lower lobe and peripheral right lower lobe, suspicious for multifocal pneumonia. 3. No substantial change in size of right lower lobe nodules measuring up to 10 mm. 4. Increased trace right pleural effusion. 5. Aortic Atherosclerosis (ICD10-I70.0) and Emphysema (ICD10-J43.9). Coronary artery calcifications. Assessment for potential risk factor modification, dietary therapy or pharmacologic therapy may be warranted, if clinically indicated. Electronically Signed   By: Limin  Xu M.D.   On: 08/02/2024 13:20   DG  Chest Port 1 View Result Date: 08/02/2024 EXAM: 1 VIEW(S) XRAY OF THE CHEST 08/02/2024 07:55:00 AM COMPARISON: 06/21/24 CLINICAL HISTORY: cp dib FINDINGS: LINES, TUBES AND DEVICES: Left chest port with tip in distal Superior Cava. LUNGS AND PLEURA: Right lung surgical suture chains. Right upper and right lower lobes pulmonary opacities have improved in the interval. Emphysema. Small right pleural effusion. No pulmonary edema. No pneumothorax. HEART AND MEDIASTINUM: Aortic calcification. No acute abnormality of the cardiac and mediastinal silhouettes. BONES AND SOFT TISSUES: No acute osseous abnormality. IMPRESSION: 1. Mildly increased septal markings within the periphery of the right base concerning for asymmetric edema versus chronic postinflammatory change . 2. Small loculated right pleural effusion with fluid trapped along the minor fissure, similar to the previous exam. 3. Resolution of previous right upper and right lower lobe airspace opacities. Electronically signed by: Waddell Calk MD 08/02/2024 08:09 AM EST RP  Workstation: [id number]   CT CHEST W CONTRAST Result Date: 07/31/2024 EXAM: CT CHEST WITH CONTRAST 07/30/2024 08:51:50 AM TECHNIQUE: CT of the chest was performed with the administration of 75 mL of Omnipaque  300 intravenous contrast. Multiplanar reformatted images are provided for review. Automated exposure control, iterative reconstruction, and/or weight based adjustment of the mA/kV was utilized to reduce the radiation dose to as low as reasonably achievable. CONTRAST: 75 mL of Omnipaque  300. COMPARISON: CT Chest 05/18/2024. CLINICAL HISTORY: Non-small cell lung cancer (NSCLC), non-metastatic, assess treatment response. 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 FINDINGS: MEDIASTINUM: The heart is normal in size. Small pericardial effusion is stable. Stable calcifications at the aortic valve and also stable 3-vessel coronary artery calcifications. Stable tortuosity, ectasia, and calcification of the thoracic aorta, but no dissection. The pulmonary arteries are unremarkable. The central airways are clear. Stable mild dilatation and slight wall thickening involving the mid esophagus, which may be related to radiation effect. LYMPH NODES: Small scattered mediastinal and hilar lymph nodes. No mass or overt adenopathy. No new or progressive changes. Right paratracheal node on image 39/2 measures 8 mm and is unchanged. No supraclavicular or axillary adenopathy. LUNGS AND PLEURA: Stable severe underlying lung disease with emphysema and pulmonary scarring. Stable surgical scarring changes related to prior left upper lobe lobectomy right upper lobe wedge resection. Stable extensive radiation changes involving the right upper lobe, right hilum, and paramediastinal lung. No findings suspicious for recurrent tumor. There is a new or enlarging fluid collection noted in the right major fissure. No worrisome features. The more superior and anterior nodule measures 11 mm on image 100/4. The more inferior and posterior  nodule measures 10 mm on image 102/4. Recommend continued observation. No pleural effusions or pleural lesions. No pneumothorax. SOFT TISSUES/BONES: No chest wall mass. The bony thorax is intact. No worrisome lytic or sclerotic bone lesions are identified. Stable vascular calcifications. UPPER ABDOMEN: Limited images of the upper abdomen demonstrates no acute abnormality. No hepatic or adrenal gland lesions. Stable vascular calcifications. IMPRESSION: 1. Stable severe underlying lung disease with emphysema and pulmonary scarring. 2. Stable surgical scarring changes related to prior left upper lobe lobectomy and right upper lobe wedge resection. 3. Stable extensive radiation changes involving the right upper lobe, right hilum, and paramediastinal lung without findings suspicious for recurrent tumor. 4. Stable right lower lobe pulmonary nodules. Recommend continued observation. 5. New or enlarging right major fissural fluid collection, benign. Electronically signed by: Maude Stammer MD 07/31/2024 12:04 PM EDT RP Workstation: [id number]    Alm Schneider, DO  [facility]  If 7PM-7AM, please contact night-coverage [email or web address] Password  [email or web address] 08/03/2024, 4:34 PM   LOS: 1 day

## 2024-08-03 NOTE — TOC Initial Note (Signed)
 Transition of Care [facility]) - Initial/Assessment Note    Patient Details  Name: Randall Sullivan MRN: [id number] Date of Birth: 22-Oct-1950  Transition of Care [facility]) CM/SW Contact:    Sharlyne Stabs, RN Phone Number: 08/03/2024, 3:22 PM  Clinical Narrative:         Patient admitted with sepsis. Considered to be a high risk for readmission. He is able to complete some ADLs but wife is able to assist when needed. wife provides transport when needed. He uses a cane and walker to use if and when needed. On home O2 at baseline. He is active with [facility] PT/RN. IPCM following.      Expected Discharge Plan: Home w Home Health Services Barriers to Discharge: Continued Medical Work up   Patient Goals and CMS Choice Patient states their goals for this hospitalization and ongoing recovery are:: return home CMS Medicare.gov Compare Post Acute Care list provided to:: Patient Choice offered to / list presented to : Patient [place] ownership interest in [facility].provided to:: Patient    Expected Discharge Plan and Services      Living arrangements for the past 2 months: Single Family Home                    HH Agency: [facility])     Prior Living Arrangements/Services Living arrangements for the past 2 months: Single Family Home Lives with:: Spouse Patient language and need for interpreter reviewed:: Yes Do you feel safe going back to the place where you live?: Yes      Need for Family Participation in Patient Care: Yes (Comment) Care giver support system in place?: Yes (comment) Current home services: DME Criminal Activity/Legal Involvement Pertinent to Current Situation/Hospitalization: No - Comment as needed  Activities of Daily Living   ADL Screening (condition at time of admission) Independently performs ADLs?: Yes (appropriate for developmental age) Is the patient deaf or have difficulty hearing?: No Does the patient have difficulty seeing, even  when wearing glasses/contacts?: No Does the patient have difficulty concentrating, remembering, or making decisions?: No  Permission Sought/Granted                  Emotional Assessment       Orientation: : Oriented to Self, Oriented to Place, Oriented to Situation, Oriented to  Time Alcohol / Substance Use: Not Applicable Psych Involvement: No (comment)  Admission diagnosis:  Aortic atherosclerosis [I70.0] Pleural effusion [J90] Sepsis due to undetermined organism (HCC) [A41.9] Multifocal pneumonia [J18.8] Acute congestive heart failure, unspecified heart failure type (HCC) [I50.9] Sepsis, due to unspecified organism, unspecified whether acute organ dysfunction present [facility]) [A41.9] Abdominal aortic aneurysm (AAA) without rupture, unspecified part [I71.40] Patient Active Problem List   Diagnosis Date Noted   Sepsis due to undetermined organism (HCC) 08/02/2024   Lobar pneumonia 08/02/2024   Atrial fibrillation with RVR (HCC) 06/21/2024   CAP (community acquired pneumonia) 06/21/2024   Atrial fibrillation (HCC) 06/21/2024   Peripheral neuropathy 05/25/2024   Allergic dermatitis 05/11/2024   Recurrent non-small cell lung cancer (NSCLC) (HCC) 12/03/2023   COPD exacerbation (HCC) 09/14/2023   Chronic respiratory failure with hypoxia (HCC) 03/04/2023   OSA (obstructive sleep apnea) 11/25/2022   Elevated coronary artery calcium  score 09/03/2022   Insomnia 11/09/2021   History of CVA with residual deficit 11/09/2021   Decreased peripheral vision of both eyes 11/09/2021   S/P appendectomy 11/09/2021   Infrarenal abdominal aortic aneurysm (AAA) without rupture 07/14/2021   Type 2 diabetes  mellitus with other specified complication (HCC) 07/09/2021   Benign prostatic hyperplasia with nocturia 06/25/2021   Right thalamic infarction [facility]) 03/14/2021   Right sided weakness 03/12/2021   Aortic atherosclerosis 08/30/2020   Hyperlipidemia 11/17/2019   S/P Wedge Resection RUL,  Blebectomy RUL 12/09/2018   Hearing loss of right ear 06/04/2018   Primary lung adenocarcinoma (HCC) 10/15/2016   Encounter for therapeutic drug monitoring 09/16/2016   S/P lobectomy of lung 08/05/2016   Former smoker 06/11/2016   COPD GOLD 3 using 02 prn 06/11/2016   Essential (primary) hypertension 10/06/2014   PCP:  Tobie Suzzane POUR, MD Pharmacy:   [facility] [address] [place] - [address] [place] [address] Phone: [phone] Fax: [phone]  [id number] - [place], [place] - [address] [place] [address] Phone: [phone] Fax: [phone]  [facility] - [place], [place] - [address] [facility] [address] [employer] [address] [place] [address] Phone: [phone] Fax: [phone]  [facility] [place], [place] - [address] [place] [address] Phone: [phone] Fax: [phone]  [facility] - [place], [place] - [address] [place] [address] Phone: [phone] Fax: [phone]   Social Drivers of Health (SDOH) Social History: SDOH Screenings   Food Insecurity: No Food Insecurity (08/02/2024)  Housing: Low Risk  (08/02/2024)  Transportation Needs: No Transportation Needs (08/02/2024)  Utilities: Not At Risk (08/02/2024)  Depression (PHQ2-9): Low Risk  (07/08/2024)  Financial Resource Strain: Medium Risk (05/10/2024)  Physical Activity: Inactive (05/10/2024)  Social Connections: Socially Isolated (08/02/2024)  Stress: No Stress Concern Present (05/10/2024)  Tobacco Use: Medium Risk (08/02/2024)   SDOH Interventions:     Readmission Risk Interventions    08/03/2024    3:21 PM 06/25/2024    9:38 AM 06/23/2024    1:25 PM  Readmission Risk Prevention Plan  Transportation Screening Complete Complete Complete  PCP or Specialist Appt within 5-7 Days Complete    Home Care Screening Complete    Medication Review (RN  CM) Complete    HRI or Home Care Consult  Complete Complete  Social Work Consult for Recovery Care Planning/Counseling  Complete Complete  Palliative Care Screening  Not Applicable Not Applicable  Medication Review [employer])  Complete Complete

## 2024-08-03 NOTE — Plan of Care (Signed)

## 2024-08-03 NOTE — Progress Notes (Signed)
 Mr. Preece was having a coughing spell, Dr. Evonnie ordered PRN syrup was administered and has been affective.

## 2024-08-03 NOTE — Progress Notes (Signed)
 PHARMACY - ANTICOAGULATION CONSULT NOTE  Pharmacy Consult for warfarin Indication: atrial fibrillation  No Known Allergies  Patient Measurements: Weight: 93.4 kg (206 lb)  Vital Signs: Temp: 97.9 F (36.6 C) (11/04 0445) Temp Source: Oral (11/04 0422) BP: 122/67 (11/04 0806) Pulse Rate: 85 (11/04 0806)  Labs: Recent Labs    08/02/24 0805 08/03/24 0406  HGB 11.0* 10.3*  HCT 36.7* 33.9*  PLT 220 205  LABPROT 29.1* 32.1*  INR 2.6* 2.9*  CREATININE 0.80 0.82    Estimated Creatinine Clearance: 89.2 mL/min (by C-G formula based on SCr of 0.82 mg/dL).   Medical History: Past Medical History:  Diagnosis Date   A-fib (HCC) 2017   after last lung surgery patient had a history if Afib documented in hospital    AAA (abdominal aortic aneurysm)    4.0 cm 09/17/22 US    Acute appendicitis 07/14/2021   Arthritis    back & legs ( knees)   Cancer (HCC)    lung   Complication of anesthesia    agitated upon waking from anesth.    Dyspnea    Dysrhythmia    A. Fib   Emphysema    Emphysema of lung (HCC)    History of kidney stones    Hyperlipidemia    Hypertension    Oxygen  dependent    2L continuous   Peripheral vascular disease    Aortic Aneurysm   Pneumonia    Pre-diabetes    Sleep apnea    No OSA, but wears 2L [place] O2 continuous including at night   Stroke [facility]) 2022   Right sided weakness, decreased hearing, balance and memory loss   Tobacco abuse     Assessment: Pharmacy consulted to dose warfarin in patient with atrial fibrillation.  Patient is on 5 mg every Mon+Fri and 2.5 mg ROW PTA.  INR on admission is therapeutic at 2.6 with last dose 11/2.  INR continues to be at goal, now at upper end at 2.9. Will continue with home dose for now as he is due for lower dose tonight. No bleeding issues noted.    Goal of Therapy:  INR 2-3 Monitor platelets by anticoagulation protocol: Yes   Plan:  Warfarin 2.5 mg x 1 dose. Monitor daily INR and s/s of bleeding  Dempsey Blush PharmD., BCPS Clinical Pharmacist 08/03/2024 8:50 AM

## 2024-08-04 DIAGNOSIS — A419 Sepsis, unspecified organism: Secondary | ICD-10-CM | POA: Diagnosis not present

## 2024-08-04 LAB — MRSA NEXT GEN BY PCR, NASAL: MRSA by PCR Next Gen: NOT DETECTED

## 2024-08-04 LAB — PROTIME-INR
INR: 4 — ABNORMAL HIGH (ref 0.8–1.2)
Prothrombin Time: 40.4 s — ABNORMAL HIGH (ref 11.4–15.2)

## 2024-08-04 LAB — STREP PNEUMONIAE URINARY ANTIGEN: Strep Pneumo Urinary Antigen: NEGATIVE

## 2024-08-04 NOTE — Plan of Care (Signed)
  Problem: Health Behavior/Discharge Planning: Goal: Ability to manage health-related needs will improve Outcome: Progressing   Problem: Clinical Measurements: Goal: Will remain free from infection Outcome: Progressing Goal: Diagnostic test results will improve Outcome: Progressing   Problem: Coping: Goal: Level of anxiety will decrease Outcome: Progressing   Problem: Elimination: Goal: Will not experience complications related to urinary retention Outcome: Progressing   Problem: Pain Managment: Goal: General experience of comfort will improve and/or be controlled Outcome: Progressing   Problem: Safety: Goal: Ability to remain free from injury will improve Outcome: Progressing   Problem: Skin Integrity: Goal: Risk for impaired skin integrity will decrease Outcome: Progressing

## 2024-08-04 NOTE — Plan of Care (Signed)
  Problem: Education: Goal: Knowledge of General Education information will improve Description: Including pain rating scale, medication(s)/side effects and non-pharmacologic comfort measures Outcome: Progressing   Problem: Health Behavior/Discharge Planning: Goal: Ability to manage health-related needs will improve Outcome: Progressing   Problem: Clinical Measurements: Goal: Ability to maintain clinical measurements within normal limits will improve Outcome: Progressing   Problem: Nutrition: Goal: Adequate nutrition will be maintained Outcome: Progressing   Problem: Coping: Goal: Level of anxiety will decrease Outcome: Progressing   Problem: Elimination: Goal: Will not experience complications related to bowel motility Outcome: Progressing Goal: Will not experience complications related to urinary retention Outcome: Progressing

## 2024-08-04 NOTE — Progress Notes (Addendum)
 [facility] PROGRESS NOTE  Randall Sullivan (DOB: July 20, 1951) FMW:[id number] PCP: Tobie Suzzane POUR, MD  Brief Narrative: 73 year old male with a history of non-small cell lung cancer, COPD, chronic respiratory failure on 2 L, proximal atrial fibrillation, AAA, hypertension, hyperlipidemia, prediabetes, BPH, CVA 02/2021 presenting with 2-day history of shortness of breath, coughing, and generalized weakness.  The patient has had some subjective fevers and chills.  He denies any headache, neck pain, hemoptysis, nausea, vomiting, diarrhea.  The patient did have some lower chest pain worsening with coughing.  He denies any dysuria, hematochezia, melena, hematuria.  He denies any increasing abdominal girth or lower extremity edema worsening.  He states that his weight is actually decreased in the past 2 to 3 weeks.  He has had decreased oral intake.   Notably, the patient was hospitalized from 06/21/2024 to 06/26/2024 for pneumonia and COPD exacerbation.  The patient developed atrial fibrillation with RVR at that time and required DCCV on 06/25/2024.  He was discharged home with metoprolol  100 mg daily, diltiazem  CD1 120 mg daily and continued on warfarin.  He was discharged with levofloxacin .   In the ED, the patient was afebrile and hemodynamically stable with oxygen  saturation 97% on 3 L.  WBC 8.7, hemoglobin 1.0, platelets 220.  Sodium 137, potassium 4.2, bicarbonate 31, serum creatinine 0.80.  LFTs were unremarkable.  CTA chest was negative for PE.  Showed interval development of RUL peripheral consolidation; scattered GGO LLL and RLL.  There is unchanged loculated effusion on the right major fissure and trace right pleural effusion.  CT abdomen pelvis was negative for any acute findings.  He had cholelithiasis without cholecystitis.  There was an unchanged infrarenal aortic aneurysm measuring 4.5 x 4.4 cm.  There is no hydronephrosis.  The patient was started on ceftriaxone  and azithromycin .  He was given  furosemide  40 mg IV x 1.   Subjective: Breathing slightly better, but still with coughing spells and shortness of breath with exertion. No chest pain. Spouse at bedside.   Objective: BP 123/68 (BP Location: Left Arm)   Pulse 83   Temp 98 F (36.7 C) (Axillary)   Resp 20   Ht 5' 9 (1.753 m)   Wt 93.4 kg   SpO2 99%   BMI 30.41 kg/m   Gen: Pleasant chronically ill-appearing male in no distress Pulm: Nonlabored, diminished with crackles/rhonchi at bases  CV: RRR GI: Soft, NT, ND, +BS  Neuro: Alert and oriented. No new focal deficits. Ext: Warm, no deformities. Skin: Port accessed, no discharge erythema or tenderness. No other new rashes, lesions or ulcers on visualized skin   Assessment & Plan: Sepsis - Present on admission - Presented with tachycardia and tachypnea - Secondary to pneumonia - Lactic acid 0.9 -COVID/RSV/Flu -neg -viral respiratory panel--neg - blood culture neg to date - sepsis physiology resolved   Lobar pneumonia -08/02/2024 CTA chest as discussed above - Continue ceftriaxone , doxycycline . Day 3 of antibiotics, will plan 7 days given recurrence and immunotherapy/cancer pt.     COPD exacerbation - Started Brovana  - Started Pulmicort  - Continue IV Solu-Medrol  - Continue DuoNebs   Chronic respiratory failure with hypoxia - Chronically on 2 L, desaturated and dyspneic with mobility today. Though exam is improving.    Recurrent non-small cell lung cancer--stage III -S/p chemoradiation with carboplatin  and paclitaxel . Currently on consolidation durvalumab , will defer to Dr. Davonna regarding whether this will be continued on schedule next week.  -left VATS in 2017 with lobectomy and lymph node resection  -right  VATS with right upper lobe nodule and bleb resection in 2020,    Elevated troponin - Not consistent with ACS - Troponin 32>> 28   Diabetes mellitus type 2, controlled - Currently off any agents - 05/25/2024 hemoglobin A1c 5.4   Infrarenal  AAA - Unchanged on 08/02/24 CTA - Continue outpatient surveillance   Mixed hyperlipidemia - Continue statin   BPH - Continue tamsulosin    HTN, PAF:  - Continue metoprolol  succinate and diltiazem  CD - Coumadin  per pharmacy  Bernardino KATHEE Come, MD [facility] [email or web address] 08/04/2024, 3:26 PM

## 2024-08-04 NOTE — Progress Notes (Signed)
 PHARMACY - ANTICOAGULATION CONSULT NOTE  Pharmacy Consult for warfarin Indication: atrial fibrillation  No Known Allergies  Patient Measurements: Height: 5' 9 (175.3 cm) Weight: 93.4 kg (205 lb 14.6 oz) IBW/kg (Calculated) : 70.7 HEPARIN  DW (KG): 89.9  Vital Signs: Temp: 97.9 F (36.6 C) (11/05 0513) Temp Source: Oral (11/05 0513) BP: 132/90 (11/05 0513) Pulse Rate: 82 (11/05 0513)  Labs: Recent Labs    08/02/24 0805 08/03/24 0406 08/04/24 0407  HGB 11.0* 10.3*  --   HCT 36.7* 33.9*  --   PLT 220 205  --   LABPROT 29.1* 32.1* 40.4*  INR 2.6* 2.9* 4.0*  CREATININE 0.80 0.82  --     Estimated Creatinine Clearance: 89.2 mL/min (by C-G formula based on SCr of 0.82 mg/dL).   Medical History: Past Medical History:  Diagnosis Date   A-fib (HCC) 2017   after last lung surgery patient had a history if Afib documented in hospital    AAA (abdominal aortic aneurysm)    4.0 cm 09/17/22 US    Acute appendicitis 07/14/2021   Arthritis    back & legs ( knees)   Cancer (HCC)    lung   Complication of anesthesia    agitated upon waking from anesth.    Dyspnea    Dysrhythmia    A. Fib   Emphysema    Emphysema of lung (HCC)    History of kidney stones    Hyperlipidemia    Hypertension    Oxygen  dependent    2L continuous   Peripheral vascular disease    Aortic Aneurysm   Pneumonia    Pre-diabetes    Sleep apnea    No OSA, but wears 2L [place] O2 continuous including at night   Stroke [facility]) 2022   Right sided weakness, decreased hearing, balance and memory loss   Tobacco abuse     Assessment: Pharmacy consulted to dose warfarin in patient with atrial fibrillation.  Patient is on 5 mg every Mon+Fri and 2.5 mg ROW PTA.  INR on admission is therapeutic at 2.6 with last dose 11/2.  INR 2.6 > 2.9 > 4.0   Goal of Therapy:  INR 2-3 Monitor platelets by anticoagulation protocol: Yes   Plan:  Hold warfarin x 1 dose Monitor daily INR and s/s of bleeding  Randall Sullivan, PharmD Clinical Pharmacist 08/04/2024 7:56 AM

## 2024-08-04 NOTE — Progress Notes (Signed)
 [name] started having another coughing episode and prn medication was given and was effective

## 2024-08-04 NOTE — Progress Notes (Signed)
 Mobility Specialist Progress Note:    08/04/24 1120  Mobility  Activity Ambulated with assistance  Level of Assistance Modified independent, requires aide device or extra time  Assistive Device Front wheel walker  Distance Ambulated (ft) 60 ft  Range of Motion/Exercises Active;All extremities  Activity Response Tolerated well  Mobility Referral Yes  Mobility visit 1 Mobility  Mobility Specialist Start Time (ACUTE ONLY) 1120  Mobility Specialist Stop Time (ACUTE ONLY) 1140  Mobility Specialist Time Calculation (min) (ACUTE ONLY) 20 min   Pt received in bed, agreeable to mobility. ModI to stand and ambulate with RW. Tolerated well, SpO2 98% on 2.5L at rest. SpO2 88% on 2L EOS, slight SOB. Left sitting EOB, all needs met.  Quanisha Drewry Mobility Specialist Please contact via Special Educational Needs Teacher or  Rehab office at [phone]

## 2024-08-05 ENCOUNTER — Inpatient Hospital Stay

## 2024-08-05 ENCOUNTER — Inpatient Hospital Stay: Admitting: Oncology

## 2024-08-05 DIAGNOSIS — A419 Sepsis, unspecified organism: Secondary | ICD-10-CM | POA: Diagnosis not present

## 2024-08-05 LAB — LEGIONELLA PNEUMOPHILA SEROGP 1 UR AG: L. pneumophila Serogp 1 Ur Ag: NEGATIVE

## 2024-08-05 LAB — PROTIME-INR
INR: 3.9 — ABNORMAL HIGH (ref 0.8–1.2)
Prothrombin Time: 40 s — ABNORMAL HIGH (ref 11.4–15.2)

## 2024-08-05 MED ORDER — HYDROCODONE BIT-HOMATROP MBR 5-1.5 MG/5ML PO SOLN: 5.0000 mL | Freq: Four times a day (QID) | ORAL | 0 refills | Status: DC | PRN

## 2024-08-05 MED ORDER — PREDNISONE 20 MG PO TABS: 40.0000 mg | ORAL_TABLET | Freq: Every day | ORAL | 0 refills | Status: AC

## 2024-08-05 MED ORDER — AMOXICILLIN-POT CLAVULANATE 875-125 MG PO TABS: 1.0000 | ORAL_TABLET | Freq: Two times a day (BID) | ORAL | 0 refills | Status: AC

## 2024-08-05 NOTE — Plan of Care (Signed)
   Problem: Activity: Goal: Risk for activity intolerance will decrease Outcome: Progressing   Problem: Coping: Goal: Level of anxiety will decrease Outcome: Progressing

## 2024-08-05 NOTE — Progress Notes (Signed)
 PHARMACY - ANTICOAGULATION CONSULT NOTE  Pharmacy Consult for warfarin Indication: atrial fibrillation  No Known Allergies  Patient Measurements: Height: 5' 9 (175.3 cm) Weight: 93.4 kg (205 lb 14.6 oz) IBW/kg (Calculated) : 70.7 HEPARIN  DW (KG): 89.9  Vital Signs: Temp: 98 F (36.7 C) (11/06 0450) Temp Source: Oral (11/06 0450) BP: 139/81 (11/06 0450) Pulse Rate: 81 (11/06 0450)  Labs: Recent Labs    08/03/24 0406 08/04/24 0407 08/05/24 0310  HGB 10.3*  --   --   HCT 33.9*  --   --   PLT 205  --   --   LABPROT 32.1* 40.4* 40.0*  INR 2.9* 4.0* 3.9*  CREATININE 0.82  --   --     Estimated Creatinine Clearance: 89.2 mL/min (by C-G formula based on SCr of 0.82 mg/dL).   Medical History: Past Medical History:  Diagnosis Date   A-fib (HCC) 2017   after last lung surgery patient had a history if Afib documented in hospital    AAA (abdominal aortic aneurysm)    4.0 cm 09/17/22 US    Acute appendicitis 07/14/2021   Arthritis    back & legs ( knees)   Cancer (HCC)    lung   Complication of anesthesia    agitated upon waking from anesth.    Dyspnea    Dysrhythmia    A. Fib   Emphysema    Emphysema of lung (HCC)    History of kidney stones    Hyperlipidemia    Hypertension    Oxygen  dependent    2L continuous   Peripheral vascular disease    Aortic Aneurysm   Pneumonia    Pre-diabetes    Sleep apnea    No OSA, but wears 2L [place] O2 continuous including at night   Stroke [facility]) 2022   Right sided weakness, decreased hearing, balance and memory loss   Tobacco abuse     Assessment: Pharmacy consulted to dose warfarin in patient with atrial fibrillation.  Patient is on 5 mg every Mon+Fri and 2.5 mg ROW PTA.  INR on admission is therapeutic at 2.6 with last dose 11/2.  INR 2.6 > 2.9 > 4.0>3.9   Goal of Therapy:  INR 2-3 Monitor platelets by anticoagulation protocol: Yes   Plan:  Hold warfarin x 1 dose Monitor daily INR and s/s of bleeding  Elspeth Sour, PharmD Clinical Pharmacist 08/05/2024 8:54 AM

## 2024-08-05 NOTE — Discharge Summary (Signed)
 Physician Discharge Summary   Patient: Randall Sullivan MRN: [id number] DOB: 1950-12-22  Admit date:     08/02/2024  Discharge date: 08/05/24  Discharge Physician: Bernardino KATHEE Come   PCP: Tobie Suzzane POUR, MD   Recommendations at discharge:  Follow up with PCP in the next week, continue INR checks by HH-RN.  Follow up as scheduled with oncology, Dr. Davonna on 11/11.  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  The patient has had some subjective fevers and chills.  He denies any headache, neck pain, hemoptysis, nausea, vomiting, diarrhea.  The patient did have some lower chest pain worsening with coughing.  He denies any dysuria, hematochezia, melena, hematuria.  He denies any increasing abdominal girth or lower extremity edema worsening.  He states that his weight is actually decreased in the past 2 to 3 weeks.  He has had decreased oral intake.  Notably, the patient was hospitalized from 06/21/2024 to 06/26/2024 for pneumonia and COPD exacerbation.  The patient developed atrial fibrillation with RVR at that time and required DCCV on 06/25/2024.  He was discharged home with metoprolol  100 mg daily, diltiazem  CD1 120 mg daily and continued on warfarin.  He was discharged with levofloxacin .  In the ED, the patient was afebrile and hemodynamically stable with oxygen  saturation 97% on 3 L.  WBC 8.7,  hemoglobin 1.0, platelets 220.  Sodium 137, potassium 4.2, bicarbonate 31, serum creatinine 0.80.  LFTs were unremarkable.  CTA chest was negative for PE.  Showed interval development of RUL peripheral consolidation; scattered GGO LLL and RLL.  There is unchanged loculated effusion on the right major fissure and trace right pleural effusion.  CT abdomen pelvis was negative for any acute findings.  He had cholelithiasis without cholecystitis.  There was an unchanged infrarenal aortic aneurysm measuring 4.5 x 4.4 cm.  There is no hydronephrosis.  The patient was started on ceftriaxone  and azithromycin .  He was given furosemide  40 mg IV x 1.  Assessment and Plan: Sepsis - Present on admission - Presented with tachycardia and tachypnea - Secondary to pneumonia - Lactic acid 0.9 -COVID/RSV/Flu -neg -viral respiratory panel--neg - blood culture neg to date - sepsis physiology resolved   Lobar pneumonia -08/02/2024 CTA chest as discussed above - Continue antibiotics with augmentin , plan 7 days given recurrence and immunotherapy/cancer pt. MRSA PCR neg.   COPD exacerbation - Continue home breztri  and duonebs - Complete another 4 days of steroids after discharge   Chronic respiratory failure with hypoxia - Chronically on 2 L, continue.   Recurrent non-small cell lung cancer--stage III -S/p chemoradiation with carboplatin  and paclitaxel . Currently on consolidation Tx, will defer to Dr. Davonna regarding whether this will be continued on schedule next week.  -left VATS in 2017 with lobectomy and lymph node resection  -right VATS with right upper lobe nodule and bleb resection in 2020,    Elevated troponin - Not consistent with ACS - Troponin 32>> 28   Diabetes mellitus type 2,  controlled - Currently off any agents - 05/25/2024 hemoglobin A1c 5.4   Infrarenal AAA - Unchanged on 08/02/24 CTA - Continue outpatient surveillance   Mixed hyperlipidemia - Continue statin   BPH - Continue  tamsulosin    HTN, PAF:  - Continue metoprolol  succinate and diltiazem  CD - Coumadin  held since 11/2 for supratherapeutic INR without bleeding, INR is 3.9 on day of discharge. Will plan to restart at usual dose 11/7.   Mild acute HFpEF: Resolved with single diuretic dose  Consultants: Oncology curbside, pharmacy Procedures performed: None  Disposition: Home Diet recommendation: As tolerated DISCHARGE MEDICATION: Allergies as of 08/05/2024   No Known Allergies      Medication List     TAKE these medications    albuterol  108 (90 Base) MCG/ACT inhaler Commonly known as: VENTOLIN  HFA Inhale 2 puffs into the lungs every 4 (four) hours as needed for wheezing or shortness of breath.   ALOXI  IV Inject into the vein.   amoxicillin -clavulanate 875-125 MG tablet Commonly known as: AUGMENTIN  Take 1 tablet by mouth 2 (two) times daily for 4 days.   Breztri  Aerosphere 160-9-4.8 MCG/ACT Aero inhaler Generic drug: budesonide -glycopyrrolate -formoterol  Inhale 2 puffs into the lungs 2 (two) times daily.   cetirizine  10 MG tablet Commonly known as: ZYRTEC  Take 10 mg by mouth daily.   diltiazem  120 MG 24 hr capsule Commonly known as: CARDIZEM  CD Take 1 capsule (120 mg total) by mouth daily.   fenofibrate  145 MG tablet Commonly known as: TRICOR  Take 1 tablet (145 mg total) by mouth daily.   finasteride  5 MG tablet Commonly known as: PROSCAR  Take 1 tablet (5 mg total) by mouth daily.   guaiFENesin -dextromethorphan  100-10 MG/5ML syrup Commonly known as: ROBITUSSIN DM Take 10 mLs by mouth every 8 (eight) hours as needed for cough.   HYDROcodone  bit-homatropine 5-1.5 MG/5ML syrup Commonly known as: HYCODAN Take 5 mLs by mouth every 6 (six) hours as needed for cough.   hydrOXYzine  25 MG tablet Commonly known as: ATARAX  Take 1 tablet (25 mg total) by mouth every 8 (eight) hours as needed for itching.   ipratropium-albuterol  0.5-2.5 (3) MG/3ML Soln Commonly known as: DUONEB Take  3 mLs by nebulization every 6 (six) hours as needed.   magnesium  oxide 400 (240 Mg) MG tablet Commonly known as: MAG-OX Take 1 tablet (400 mg total) by mouth 2 (two) times daily.   metoprolol  succinate 100 MG 24 hr tablet Commonly known as: Toprol  XL Take 1 tablet (100 mg total) by mouth daily. Take with or immediately following a meal.   OXYGEN  Inhale 2 L into the lungs continuous. Hx of lung ca, copd, and emphysema   predniSONE  20 MG tablet Commonly known as: DELTASONE  Take 2 tablets (40 mg total) by mouth daily with breakfast for 4 days.   rosuvastatin  40 MG tablet Commonly known as: CRESTOR  Take 1 tablet (40 mg total) by mouth daily.   tamsulosin  0.4 MG Caps capsule Commonly known as: FLOMAX  Take 2 capsules (0.8 mg total) by mouth daily.   warfarin 5 MG tablet Commonly known as: COUMADIN  Take as directed. If you are unsure how to take this medication, talk to your nurse or doctor. Original instructions: Take 0.5-1 tablets (2.5-5 mg total) by mouth See admin instructions. Take 2.5 mg by mouth Tuesday and Friday and take 5 mg on Monday, Wednesday, Thursday, Saturday and Sunday        Follow-up Information     [facility]) Follow up.   Specialty:  Home Health Services Why: PT/RN will call to schedule your next home vist. Contact information: [address] [place]   [address] [phone]        Tobie Suzzane POUR, MD Follow up.   Specialty: Internal Medicine Contact information: [address] [place] [address] [phone]         Davonna Siad, MD Follow up.   Specialty: Oncology Contact information: [address] [place] [address] [phone]                Discharge Exam: Filed Weights   08/02/24 1500 08/03/24 1148  Weight: 93.4 kg 93.4 kg  Nontoxic elderly gentleman in no distress reporting he feels better, wants to go home. Spouse at bedside in agreement. Getting neb now and per RRT at  bedside, now has improved air movement. Also with some end-expiratory wheezes. Normal rate and effort. RRR.   Condition at discharge: stable  The results of significant diagnostics from this hospitalization (including imaging, microbiology, ancillary and laboratory) are listed below for reference.   Imaging Studies: CT ABDOMEN PELVIS W CONTRAST Result Date: 08/02/2024 CLINICAL DATA:  Two day history of epigastric pain and weakness EXAM: CT ABDOMEN AND PELVIS WITH CONTRAST TECHNIQUE: Multidetector CT imaging of the abdomen and pelvis was performed using the standard protocol following bolus administration of intravenous contrast. RADIATION DOSE REDUCTION: This exam was performed according to the departmental dose-optimization program which includes automated exposure control, adjustment of the mA and/or kV according to patient size and/or use of iterative reconstruction technique. CONTRAST:  75mL OMNIPAQUE  IOHEXOL  350 MG/ML SOLN COMPARISON:  CT abdomen and pelvis dated 07/14/2021, ultrasound aorta dated 10/28/2023 FINDINGS: Lower chest: Please see separately dictated CTA chest report for detailed findings. Hepatobiliary: No focal hepatic lesions. No intra or extrahepatic biliary ductal dilation. Cholelithiasis Pancreas: No focal lesions or main ductal dilation. Spleen: Normal in size without focal abnormality. 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 No right adrenal nodule. Bilateral simple/minimally complicated cysts measuring up to 1.4 cm in the lower pole right kidney (10:34). No hydronephrosis or calculi. No focal bladder wall thickening. Stomach/Bowel: Normal appearance of the stomach. No evidence of bowel wall thickening, distention, or inflammatory changes. Colonic diverticulosis without acute diverticulitis. Postsurgical changes of appendectomy. Vascular/Lymphatic: Aortic atherosclerosis. Infrarenal abdominal aortic aneurysm measures 4.5 x 4.4 cm, not substantially changed  from 10/28/2023. No enlarged abdominal or pelvic lymph nodes. Reproductive: Prostate is unremarkable. Other: No free fluid, fluid collection, or free air. Musculoskeletal: No acute or abnormal lytic or blastic osseous lesions. Small fat-containing bilateral inguinal hernias. IMPRESSION: 1. No acute abdominopelvic findings. 2. Cholelithiasis without evidence of acute cholecystitis. 3. Infrarenal abdominal aortic aneurysm measures 4.5 x 4.4 cm, not substantially changed from 10/28/2023. Recommend follow-up CT or MR as appropriate in 12 months and referral to or continued care with vascular specialist. (Ref.: J Vasc Surg. 2018; 67:2-77 and J Am Coll Radiol 2013;10(10):789-794.) 4.  Aortic Atherosclerosis (ICD10-I70.0). Electronically Signed   By: Limin  Xu M.D.   On: 08/02/2024 13:29   CT Angio Chest PE W and/or Wo Contrast Result Date: 08/02/2024 CLINICAL DATA:  Two day history of central chest pain and shortness of breath. History of recurrent right lung adenocarcinoma. EXAM: CT ANGIOGRAPHY CHEST WITH CONTRAST TECHNIQUE: Multidetector CT imaging of the chest was performed using the standard protocol during bolus administration of intravenous contrast. Multiplanar CT image reconstructions and MIPs were obtained to evaluate the vascular anatomy. RADIATION DOSE REDUCTION: This exam was performed according to the departmental dose-optimization  program which includes automated exposure control, adjustment of the mA and/or kV according to patient size and/or use of iterative reconstruction technique. CONTRAST:  75mL OMNIPAQUE  IOHEXOL  350 MG/ML SOLN COMPARISON:  Same day chest radiograph, CT chest dated 07/30/2024 FINDINGS: Cardiovascular: Partially imaged left chest wall port tip terminates in the right atrium. The study is high quality for the evaluation of pulmonary embolism. There are no filling defects in the central, lobar, segmental or subsegmental pulmonary artery branches to suggest acute pulmonary embolism.  Great vessels are normal in course and caliber. Normal heart size. No significant pericardial fluid/thickening. Coronary artery calcifications and aortic atherosclerosis. Mediastinum/Nodes: Imaged thyroid  gland without nodules meeting criteria for imaging follow-up by size. Mildly patulous esophagus. No pathologically enlarged axillary, supraclavicular, mediastinal, or hilar lymph nodes. Lungs/Pleura: The central airways are patent. Severe centrilobular and paraseptal emphysema. Postsurgical changes of left upper lobectomy and right upper lobe wedge resection. Similar consolidation and architectural distortion related to prior radiation treatment of the right lung. No substantial change in size of right lower lobe nodules measuring 9 x 6 mm (lung series 4, uploaded to the CT abdomen pelvis accession image 94) and 10 x 6 mm (4:96). Interval development of right upper lobe peripheral consolidation and ground-glass opacity (4:37) and scattered patchy ground-glass opacities in the posterior left lower lobe and peripheral right lower lobe. No pneumothorax. Unchanged loculated pleural effusion in the right major fissure. Increased trace right pleural effusion. Upper abdomen: Please see separately dictated CT abdomen and pelvis report for detailed findings. Musculoskeletal: No acute or abnormal lytic or blastic osseous lesions. Multilevel degenerative changes of the thoracic spine. Review of the MIP images confirms the above findings. IMPRESSION: 1. No evidence of acute pulmonary embolism. 2. Interval development of right upper lobe peripheral consolidation and scattered patchy ground-glass opacities in the posterior left lower lobe and peripheral right lower lobe, suspicious for multifocal pneumonia. 3. No substantial change in size of right lower lobe nodules measuring up to 10 mm. 4. Increased trace right pleural effusion. 5. Aortic Atherosclerosis (ICD10-I70.0) and Emphysema (ICD10-J43.9). Coronary artery  calcifications. Assessment for potential risk factor modification, dietary therapy or pharmacologic therapy may be warranted, if clinically indicated. Electronically Signed   By: Limin  Xu M.D.   On: 08/02/2024 13:20   DG Chest Port 1 View Result Date: 08/02/2024 EXAM: 1 VIEW(S) XRAY OF THE CHEST 08/02/2024 07:55:00 AM COMPARISON: 06/21/24 CLINICAL HISTORY: cp dib FINDINGS: LINES, TUBES AND DEVICES: Left chest port with tip in distal Superior Cava. LUNGS AND PLEURA: Right lung surgical suture chains. Right upper and right lower lobes pulmonary opacities have improved in the interval. Emphysema. Small right pleural effusion. No pulmonary edema. No pneumothorax. HEART AND MEDIASTINUM: Aortic calcification. No acute abnormality of the cardiac and mediastinal silhouettes. BONES AND SOFT TISSUES: No acute osseous abnormality. IMPRESSION: 1. Mildly increased septal markings within the periphery of the right base concerning for asymmetric edema versus chronic postinflammatory change . 2. Small loculated right pleural effusion with fluid trapped along the minor fissure, similar to the previous exam. 3. Resolution of previous right upper and right lower lobe airspace opacities. Electronically signed by: Waddell Calk MD 08/02/2024 08:09 AM EST RP Workstation: [id number]   CT CHEST W CONTRAST Result Date: 07/31/2024 EXAM: CT CHEST WITH CONTRAST 07/30/2024 08:51:50 AM TECHNIQUE: CT of the chest was performed with the administration of 75 mL of Omnipaque  300 intravenous contrast. Multiplanar reformatted images are provided for review. Automated exposure control, iterative reconstruction, and/or weight based adjustment of the mA/kV  was utilized to reduce the radiation dose to as low as reasonably achievable. CONTRAST: 75 mL of Omnipaque  300. COMPARISON: CT Chest 05/18/2024. CLINICAL HISTORY: Non-small cell lung cancer (NSCLC), non-metastatic, assess treatment response. 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  FINDINGS: MEDIASTINUM: The heart is normal in size. Small pericardial effusion is stable. Stable calcifications at the aortic valve and also stable 3-vessel coronary artery calcifications. Stable tortuosity, ectasia, and calcification of the thoracic aorta, but no dissection. The pulmonary arteries are unremarkable. The central airways are clear. Stable mild dilatation and slight wall thickening involving the mid esophagus, which may be related to radiation effect. LYMPH NODES: Small scattered mediastinal and hilar lymph nodes. No mass or overt adenopathy. No new or progressive changes. Right paratracheal node on image 39/2 measures 8 mm and is unchanged. No supraclavicular or axillary adenopathy. LUNGS AND PLEURA: Stable severe underlying lung disease with emphysema and pulmonary scarring. Stable surgical scarring changes related to prior left upper lobe lobectomy right upper lobe wedge resection. Stable extensive radiation changes involving the right upper lobe, right hilum, and paramediastinal lung. No findings suspicious for recurrent tumor. There is a new or enlarging fluid collection noted in the right major fissure. No worrisome features. The more superior and anterior nodule measures 11 mm on image 100/4. The more inferior and posterior nodule measures 10 mm on image 102/4. Recommend continued observation. No pleural effusions or pleural lesions. No pneumothorax. SOFT TISSUES/BONES: No chest wall mass. The bony thorax is intact. No worrisome lytic or sclerotic bone lesions are identified. Stable vascular calcifications. UPPER ABDOMEN: Limited images of the upper abdomen demonstrates no acute abnormality. No hepatic or adrenal gland lesions. Stable vascular calcifications. IMPRESSION: 1. Stable severe underlying lung disease with emphysema and pulmonary scarring. 2. Stable surgical scarring changes related to prior left upper lobe lobectomy and right upper lobe wedge resection. 3. Stable extensive radiation  changes involving the right upper lobe, right hilum, and paramediastinal lung without findings suspicious for recurrent tumor. 4. Stable right lower lobe pulmonary nodules. Recommend continued observation. 5. New or enlarging right major fissural fluid collection, benign. Electronically signed by: Maude Stammer MD 07/31/2024 12:04 PM EDT RP Workstation: [id number]    Microbiology: Results for orders placed or performed during the hospital encounter of 08/02/24  Resp panel by RT-PCR (RSV, Flu A&B, Covid) Anterior Nasal Swab     Status: None   Collection Time: 08/02/24  7:56 AM   Specimen: Anterior Nasal Swab  Result Value Ref Range Status   SARS Coronavirus 2 by RT PCR NEGATIVE NEGATIVE Final    Comment: (NOTE) SARS-CoV-2 target nucleic acids are NOT DETECTED.  The SARS-CoV-2 RNA is generally detectable in upper respiratory specimens during the acute phase of infection. The lowest concentration of SARS-CoV-2 viral copies this assay can detect is 138 copies/mL. A negative result does not preclude SARS-Cov-2 infection and should not be used as the sole basis for treatment or other patient management decisions. A negative result may occur with  improper specimen collection/handling, submission of specimen other than nasopharyngeal swab, presence of viral mutation(s) within the areas targeted by this assay, and inadequate number of viral copies(<138 copies/mL). A negative result must be combined with clinical observations, patient history, and epidemiological information. The expected result is Negative.  Fact Sheet for Patients:  bloggercourse.com  Fact Sheet for Healthcare Providers:  seriousbroker.it  This test is no t yet approved or cleared by the [place]  FDA and  has been authorized for detection and/or diagnosis of  SARS-CoV-2 by FDA under an Emergency Use Authorization (EUA). This EUA will remain  in effect (meaning this  test can be used) for the duration of the COVID-19 declaration under Section 564(b)(1) of the Act, 21 U.S.C.section 360bbb-3(b)(1), unless the authorization is terminated  or revoked sooner.       Influenza A by PCR NEGATIVE NEGATIVE Final   Influenza B by PCR NEGATIVE NEGATIVE Final    Comment: (NOTE) The Xpert Xpress SARS-CoV-2/FLU/RSV plus assay is intended as an aid in the diagnosis of influenza from Nasopharyngeal swab specimens and should not be used as a sole basis for treatment. Nasal washings and aspirates are unacceptable for Xpert Xpress SARS-CoV-2/FLU/RSV testing.  Fact Sheet for Patients: bloggercourse.com  Fact Sheet for Healthcare Providers: seriousbroker.it  This test is not yet approved or cleared by the [place]  FDA and has been authorized for detection and/or diagnosis of SARS-CoV-2 by FDA under an Emergency Use Authorization (EUA). This EUA will remain in effect (meaning this test can be used) for the duration of the COVID-19 declaration under Section 564(b)(1) of the Act, 21 U.S.C. section 360bbb-3(b)(1), unless the authorization is terminated or revoked.     Resp Syncytial Virus by PCR NEGATIVE NEGATIVE Final    Comment: (NOTE) Fact Sheet for Patients: bloggercourse.com  Fact Sheet for Healthcare Providers: seriousbroker.it  This test is not yet approved or cleared by the [place]  FDA and has been authorized for detection and/or diagnosis of SARS-CoV-2 by FDA under an Emergency Use Authorization (EUA). This EUA will remain in effect (meaning this test can be used) for the duration of the COVID-19 declaration under Section 564(b)(1) of the Act, 21 U.S.C. section 360bbb-3(b)(1), unless the authorization is terminated or revoked.  Performed at [facility], [address], [place], [place] [address]   Blood culture (routine x 2)     Status:  None (Preliminary result)   Collection Time: 08/02/24  1:43 PM   Specimen: BLOOD  Result Value Ref Range Status   Specimen Description BLOOD RIGHT ANTECUBITAL  Final   Special Requests   Final    BOTTLES DRAWN AEROBIC AND ANAEROBIC Blood Culture adequate volume   Culture   Final    NO GROWTH 3 DAYS Performed at [facility], [address], [place], [place] [address]    Report Status PENDING  Incomplete  Blood culture (routine x 2)     Status: None (Preliminary result)   Collection Time: 08/02/24  1:54 PM   Specimen: BLOOD  Result Value Ref Range Status   Specimen Description BLOOD BLOOD RIGHT HAND  Final   Special Requests   Final    Blood Culture adequate volume BOTTLES DRAWN AEROBIC AND ANAEROBIC   Culture   Final    NO GROWTH 3 DAYS Performed at [facility], [address] [place], [place] [address]    Report Status PENDING  Incomplete  Respiratory (~20 pathogens) panel by PCR     Status: None   Collection Time: 08/02/24  3:34 PM   Specimen: Nasopharyngeal Swab; Respiratory  Result Value Ref Range Status   Adenovirus NOT DETECTED NOT DETECTED Final   Coronavirus 229E NOT DETECTED NOT DETECTED Final    Comment: (NOTE) The Coronavirus on the Respiratory Panel, DOES NOT test for the novel  Coronavirus (2019 nCoV)    Coronavirus HKU1 NOT DETECTED NOT DETECTED Final   Coronavirus NL63 NOT DETECTED NOT DETECTED Final   Coronavirus OC43 NOT DETECTED NOT DETECTED Final   Metapneumovirus NOT DETECTED NOT DETECTED  Final   Rhinovirus / Enterovirus NOT DETECTED NOT DETECTED Final   Influenza A NOT DETECTED NOT DETECTED Final   Influenza B NOT DETECTED NOT DETECTED Final   Parainfluenza Virus 1 NOT DETECTED NOT DETECTED Final   Parainfluenza Virus 2 NOT DETECTED NOT DETECTED Final   Parainfluenza Virus 3 NOT DETECTED NOT DETECTED Final   Parainfluenza Virus 4 NOT DETECTED NOT DETECTED Final   Respiratory Syncytial Virus NOT DETECTED NOT DETECTED Final   Bordetella pertussis NOT  DETECTED NOT DETECTED Final   Bordetella Parapertussis NOT DETECTED NOT DETECTED Final   Chlamydophila pneumoniae NOT DETECTED NOT DETECTED Final   Mycoplasma pneumoniae NOT DETECTED NOT DETECTED Final    Comment: Performed at [facility], [address], [place], [place] [address]  MRSA Next Gen by PCR, Nasal     Status: None   Collection Time: 08/04/24  5:04 AM   Specimen: Nasal Mucosa; Nasal Swab  Result Value Ref Range Status   MRSA by PCR Next Gen NOT DETECTED NOT DETECTED Final    Comment: (NOTE) The GeneXpert MRSA Assay (FDA approved for NASAL specimens only), is one component of a comprehensive MRSA colonization surveillance program. It is not intended to diagnose MRSA infection nor to guide or monitor treatment for MRSA infections. Test performance is not FDA approved in patients less than 3 years old. Performed at [facility], [address] [place] [place], [place] [address]    *Note: Due to a large number of results and/or encounters for the requested time period, some results have not been displayed. A complete set of results can be found in Results Review.    Labs: CBC: Recent Labs  Lab 08/02/24 0805 08/03/24 0406  WBC 8.7 5.5  NEUTROABS 6.8  --   HGB 11.0* 10.3*  HCT 36.7* 33.9*  MCV 81.2 80.0  PLT 220 205   Basic Metabolic Panel: Recent Labs  Lab 08/02/24 0805 08/03/24 0406  NA 137 140  K 4.2 4.1  CL 100 100  CO2 31 32  GLUCOSE 98 154*  BUN 16 19  CREATININE 0.80 0.82  CALCIUM  9.1 8.9  MG  --  2.1   Liver Function Tests: Recent Labs  Lab 08/02/24 0805  AST 16  ALT 13  ALKPHOS 44  BILITOT 0.9  PROT 6.7  ALBUMIN  4.1   CBG: Recent Labs  Lab 08/02/24 1651 08/02/24 1952 08/03/24 0732  GLUCAP 126* 107* 131*    Discharge time spent: greater than 30 minutes.  Signed: Bernardino KATHEE Come, MD [facility] 08/05/2024

## 2024-08-05 NOTE — Care Management Important Message (Signed)
 Important Message  Patient Details  Name: Randall Sullivan MRN: [id number] Date of Birth: 11/04/1950   Important Message Given:  N/A - LOS <3 / Initial given by admissions     Randall Sullivan [name] 08/05/2024, 9:47 AM

## 2024-08-05 NOTE — TOC Transition Note (Signed)
 Transition of Care [facility]) - Discharge Note   Patient Details  Name: Randall Sullivan MRN: [id number] Date of Birth: [birth year]  Transition of Care [facility] CM/SW Contact:  Noreen KATHEE Cleotilde ISRAEL Phone Number: 08/05/2024, 9:13 AM   Clinical Narrative:     Patient is DC today home with HHPT/HHRN. Resumption orders are in and Artavia with [facility] was updated on DC. CSW signing off.   Final next level of care: Home w Home Health Services Barriers to Discharge: Barriers Resolved   Patient Goals and CMS Choice Patient states their goals for this hospitalization and ongoing recovery are:: return home CMS Medicare.gov Compare Post Acute Care list provided to:: Patient Choice offered to / list presented to : Patient [place] ownership interest in [facility].provided to:: Patient    Discharge Placement                Patient to be transferred to facility by: POV Name of family member notified: Spouse Patient and family notified of of transfer: 08/05/24  Discharge Plan and Services Additional resources added to the After Visit Summary for                            [facility] Arranged: PT, RN [facility] Agency: [facility] ([facility]) Date HH Agency Contacted: 08/05/24 Time HH Agency Contacted: [phone] Representative spoke with at [facility] Agency: [facility]  Social Drivers of Health (SDOH) Interventions SDOH Screenings   Food Insecurity: No Food Insecurity (08/02/2024)  Housing: Low Risk  (08/02/2024)  Transportation Needs: No Transportation Needs (08/02/2024)  Utilities: Not At Risk (08/02/2024)  Depression (PHQ2-9): Low Risk  (07/08/2024)  Financial Resource Strain: Medium Risk (05/10/2024)  Physical Activity: Inactive (05/10/2024)  Social Connections: Socially Isolated (08/02/2024)  Stress: No Stress Concern Present (05/10/2024)  Tobacco Use: Medium Risk (08/02/2024)     Readmission Risk Interventions    08/05/2024    9:12 AM 08/03/2024    3:21 PM 06/25/2024    9:38 AM   Readmission Risk Prevention Plan  Transportation Screening Complete Complete Complete  PCP or Specialist Appt within 5-7 Days  Complete   Home Care Screening  Complete   Medication Review (RN CM)  Complete   HRI or Home Care Consult Complete  Complete  Social Work Consult for Recovery Care Planning/Counseling Complete  Complete  Palliative Care Screening Not Applicable  Not Applicable  Medication Review Oceanographer) Complete  Complete

## 2024-08-06 ENCOUNTER — Telehealth: Payer: Self-pay | Admitting: Cardiology

## 2024-08-06 ENCOUNTER — Telehealth: Payer: Self-pay

## 2024-08-06 DIAGNOSIS — E1142 Type 2 diabetes mellitus with diabetic polyneuropathy: Secondary | ICD-10-CM | POA: Diagnosis not present

## 2024-08-06 DIAGNOSIS — J189 Pneumonia, unspecified organism: Secondary | ICD-10-CM | POA: Diagnosis not present

## 2024-08-06 DIAGNOSIS — E1151 Type 2 diabetes mellitus with diabetic peripheral angiopathy without gangrene: Secondary | ICD-10-CM | POA: Diagnosis not present

## 2024-08-06 DIAGNOSIS — I4891 Unspecified atrial fibrillation: Secondary | ICD-10-CM | POA: Diagnosis not present

## 2024-08-06 DIAGNOSIS — I714 Abdominal aortic aneurysm, without rupture, unspecified: Secondary | ICD-10-CM | POA: Diagnosis not present

## 2024-08-06 DIAGNOSIS — J440 Chronic obstructive pulmonary disease with acute lower respiratory infection: Secondary | ICD-10-CM | POA: Diagnosis not present

## 2024-08-06 NOTE — Transitions of Care (Post Inpatient/ED Visit) (Signed)
   08/06/2024  Name: Randall Sullivan MRN: [id number] DOB: Aug 31, 1951  Today's TOC FU Call Status: Today's TOC FU Call Status:: Unsuccessful Call (1st Attempt) Unsuccessful Call (1st Attempt) Date: 08/06/24  Attempted to reach the patient regarding the most recent Inpatient/ED visit.  Left a HIPAA approved voicemail message to phone number provided in demographics per DPR.    Follow Up Plan: Additional outreach attempts will be made to reach the patient to complete the Transitions of Care (Post Inpatient/ED visit) call.   Richerd Fish, RN, BSN, CCM [facility] Management Coordinator Direct Dial: [phone]

## 2024-08-06 NOTE — Telephone Encounter (Signed)
 Spoke with [name], nurse with [facility] and advised since she obtained an INR today of 3.7 to have patient not to take any warfarin today and have leafy green/vit k food then resume normal dose of 2.5mg  (1/2 tablet) Saturday and Sunday then recheck INR on Monday and call to [facility]. Patient is on prednisone  40mg  x 4 days-does interact and Augmentin -does not interact. She repeated instructions to confirm.

## 2024-08-06 NOTE — Telephone Encounter (Signed)
 Pt's Home Health nurse would like a c/b regarding INR orders. Please advise

## 2024-08-07 LAB — CULTURE, BLOOD (ROUTINE X 2)
Culture: NO GROWTH
Culture: NO GROWTH
Special Requests: ADEQUATE
Special Requests: ADEQUATE

## 2024-08-09 ENCOUNTER — Ambulatory Visit (INDEPENDENT_AMBULATORY_CARE_PROVIDER_SITE_OTHER): Payer: Self-pay | Admitting: *Deleted

## 2024-08-09 ENCOUNTER — Telehealth: Payer: Self-pay

## 2024-08-09 ENCOUNTER — Telehealth: Payer: Self-pay | Admitting: Cardiology

## 2024-08-09 DIAGNOSIS — Z5181 Encounter for therapeutic drug level monitoring: Secondary | ICD-10-CM

## 2024-08-09 DIAGNOSIS — J189 Pneumonia, unspecified organism: Secondary | ICD-10-CM | POA: Diagnosis not present

## 2024-08-09 DIAGNOSIS — E1142 Type 2 diabetes mellitus with diabetic polyneuropathy: Secondary | ICD-10-CM | POA: Diagnosis not present

## 2024-08-09 DIAGNOSIS — E1151 Type 2 diabetes mellitus with diabetic peripheral angiopathy without gangrene: Secondary | ICD-10-CM | POA: Diagnosis not present

## 2024-08-09 DIAGNOSIS — I714 Abdominal aortic aneurysm, without rupture, unspecified: Secondary | ICD-10-CM | POA: Diagnosis not present

## 2024-08-09 DIAGNOSIS — I4891 Unspecified atrial fibrillation: Secondary | ICD-10-CM

## 2024-08-09 DIAGNOSIS — J440 Chronic obstructive pulmonary disease with acute lower respiratory infection: Secondary | ICD-10-CM | POA: Diagnosis not present

## 2024-08-09 LAB — POCT INR: INR: 2.5 (ref 2.0–3.0)

## 2024-08-09 NOTE — Telephone Encounter (Signed)
 Called and spoke with Randall Sullivan.  Coumadin  orders given.  See coumadin  note.

## 2024-08-09 NOTE — Telephone Encounter (Signed)
 [name] from [facility] is requesting a callback regarding INR for today. Please advise

## 2024-08-09 NOTE — Transitions of Care (Post Inpatient/ED Visit) (Signed)
 08/09/2024  Name: Randall Sullivan MRN: [id number] DOB: 01/02/1951  Today's TOC FU Call Status: Unsuccessful Call (2nd Attempt) Date: 08/09/24 Patient's Name and Date of Birth confirmed.  Transition Care Management Follow-up Telephone Call Date of Discharge: 08/05/24 [facility]charge [name] Primary Inpatient Discharge Diagnosis:: Pneumonia How have you been since you were released from the hospital?: Better Any questions or concerns?: No  Items Reviewed: Did you receive and understand the discharge instructions provided?: Yes Medications obtained,verified, and reconciled?: Yes (Medications Reviewed) Any new allergies since your discharge?: No Do you have support at home?: Yes People in Home [RPT]: spouse Name of Support/Comfort Primary Source: [name] - on DPR and provided information today, patient asleep  Medications Reviewed Today: Medications Reviewed Today     Reviewed by Eilleen Richerd GRADE, RN (Registered Nurse) on 08/09/24 at 1548  Med List Status: <None>   Medication Order Taking? Sig Documenting Provider Last Dose Status Informant  albuterol  (VENTOLIN  HFA) 108 (90 Base) MCG/ACT inhaler [id number] Yes Inhale 2 puffs into the lungs every 4 (four) hours as needed for wheezing or shortness of breath. Vicci Afton CROME, MD  Active Spouse/Significant Other, Pharmacy Records           Med Note PATTRICIA MONTIE CHRISTELLA   Mon Aug 02, 2024 11:10 AM) PRN  amoxicillin -clavulanate (AUGMENTIN ) 875-125 MG tablet [id number]  Take 1 tablet by mouth 2 (two) times daily for 4 days.  Patient not taking: Reported on 08/09/2024   Bryn Bernardino NOVAK, MD  Active   budesonide -glycopyrrolate -formoterol  (BREZTRI  AEROSPHERE) 160-9-4.8 MCG/ACT AERO inhaler [id number] Yes Inhale 2 puffs into the lungs 2 (two) times daily. [provider]  Active Spouse/Significant Other, Pharmacy Records  cetirizine  (ZYRTEC ) 10 MG tablet [id number] Yes Take 10 mg by mouth daily. [provider]  Active  Spouse/Significant Other, Pharmacy Records  diltiazem  (CARDIZEM  CD) 120 MG 24 hr capsule [id number] Yes Take 1 capsule (120 mg total) by mouth daily. Willette Adriana LABOR, MD  Active Spouse/Significant Other, Pharmacy Records  fenofibrate  (TRICOR ) 145 MG tablet [id number] Yes Take 1 tablet (145 mg total) by mouth daily. Tobie Suzzane POUR, MD  Active Spouse/Significant Other, Pharmacy Records  finasteride  (PROSCAR ) 5 MG tablet [id number] Yes Take 1 tablet (5 mg total) by mouth daily. Tobie Suzzane POUR, MD  Active Spouse/Significant Other, Pharmacy Records  guaiFENesin -dextromethorphan  [facility] DM) 100-10 MG/5ML syrup [id number]  Take 10 mLs by mouth every 8 (eight) hours as needed for cough.  Patient not taking: Reported on 08/09/2024   Vicci Afton CROME, MD  Active Spouse/Significant Other, Pharmacy Records           Med Note PATTRICIA MONTIE CHRISTELLA Pablo Aug 02, 2024 11:11 AM) MENNIE  HYDROcodone  bit-homatropine (HYCODAN) 5-1.5 MG/5ML syrup [id number] Yes Take 5 mLs by mouth every 6 (six) hours as needed for cough. Bryn Bernardino NOVAK, MD  Active   hydrOXYzine  (ATARAX ) 25 MG tablet [id number]  Take 1 tablet (25 mg total) by mouth every 8 (eight) hours as needed for itching.  Patient not taking: Reported on 08/09/2024   Tobie Suzzane POUR, MD  Active Spouse/Significant Other, Pharmacy Records           Med Note PATTRICIA, MONTIE CHRISTELLA   Mon Jun 21, 2024  2:06 PM) prn  ipratropium-albuterol  (DUONEB) 0.5-2.5 (3) MG/3ML SOLN [id number] Yes Take 3 mLs by nebulization every 6 (six) hours as needed. Tobie Suzzane POUR, MD  Active Spouse/Significant Other, Pharmacy Records  magnesium  oxide (MAG-OX) 400 (240 Mg) MG tablet [id number]  Yes Take 1 tablet (400 mg total) by mouth 2 (two) times daily. Tobie Suzzane POUR, MD  Active Spouse/Significant Other, Pharmacy Records  metoprolol  succinate (TOPROL  XL) 100 MG 24 hr tablet [id number] Yes Take 1 tablet (100 mg total) by mouth daily. Take with or immediately following a meal. Shahmehdi, Adriana LABOR, MD   Active Spouse/Significant Other, Pharmacy Records           Med Note PATTRICIA MONTIE CHRISTELLA Pablo Aug 02, 2024 11:18 AM)    OXYGEN  [id number] Yes Inhale 2 L into the lungs continuous. Hx of lung ca, copd, and emphysema [provider]  Active Spouse/Significant Other, Pharmacy Records  Palonosetron  HCl (ALOXI  IV) [id number]  Inject into the vein. [provider]  Active Spouse/Significant Other, Pharmacy Records  predniSONE  (DELTASONE ) 20 MG tablet [id number]  Take 2 tablets (40 mg total) by mouth daily with breakfast for 4 days.  Patient not taking: Reported on 08/09/2024   Bryn Bernardino NOVAK, MD  Active   rosuvastatin  (CRESTOR ) 40 MG tablet [id number] Yes Take 1 tablet (40 mg total) by mouth daily. Lavona Agent, MD  Active Spouse/Significant Other, Pharmacy Records           Med Note ALDONA, [place] N   Tue Dec 09, 2023  2:19 PM)    tamsulosin  (FLOMAX ) 0.4 MG CAPS capsule [id number] Yes Take 2 capsules (0.8 mg total) by mouth daily. Tobie Suzzane POUR, MD  Active Spouse/Significant Other, Pharmacy Records  warfarin (COUMADIN ) 5 MG tablet [id number]  Take 0.5-1 tablets (2.5-5 mg total) by mouth See admin instructions. Take 2.5 mg by mouth Tuesday and Friday and take 5 mg on Monday, Wednesday, Thursday, Saturday and Sunday Vicci Afton CROME, MD  Active Spouse/Significant Other, Pharmacy Records           Med Note [place], RICHERD CINDERELLA Debar Jun 29, 2024  2:43 PM) 06/29/24 Wife states he is followed closely by the Coumadin  Clinic for changes with INR            Home Care and Equipment/Supplies: Were Home Health Services Ordered?: Yes Name of Home Health Agency:: [facility] Has Agency set up a time to come to your home?: Yes First Home Health Visit Date: 08/09/24 Any new equipment or medical supplies ordered?: No  Functional Questionnaire: Do you need assistance with bathing/showering or dressing?: Yes Do you need assistance with meal preparation?: Yes Do you need assistance with  eating?: No Do you have difficulty maintaining continence: No Do you need assistance with getting out of bed/getting out of a chair/moving?: Yes Do you have difficulty managing or taking your medications?: Yes  Follow up appointments reviewed: PCP Follow-up appointment confirmed?: No Giles states, I prefer to get his couple appointments with oncology done and then go from there Offerred to assist with follow up but she declines right now) Specialist Hospital Follow-up appointment confirmed?: Yes Date of Specialist follow-up appointment?: 08/10/24 Follow-Up Specialty Provider:: Oncology - [facility] Do you need transportation to your follow-up appointment?: No Do you understand care options if your condition(s) worsen?: Yes-patient verbalized understanding  SDOH Interventions Today    Flowsheet Row Most Recent Value  SDOH Interventions   Food Insecurity Interventions Intervention Not Indicated  Housing Interventions Intervention Not Indicated  Transportation Interventions Intervention Not Indicated  Utilities Interventions Intervention Not Indicated   Education for self-mgmt of  pneumonia provided during this outreach regarding: -s/s of worsening condition and when to seek medical attention -importance of completing all post discharge hospital hospital  follow up appts -adherence to med regimen [facility] TOC 30-day program enrollment reviewed and discussed with pt/caregiver. They have declined enrollment in 30 day TOC program due to:  Richerd Fish, RN, BSN, CCM [facility], [facility] Management Coordinator Direct Dial: [phone]

## 2024-08-09 NOTE — Patient Instructions (Signed)
 Continue warfarin 1/2 tablet daily except 1 tablet on Mondays and Fridays Recheck in 1 wk. Orders given to [name] RN [facility]

## 2024-08-09 NOTE — Progress Notes (Signed)
 INR 2.5. Please see anticoagulation encounter

## 2024-08-10 ENCOUNTER — Inpatient Hospital Stay (HOSPITAL_BASED_OUTPATIENT_CLINIC_OR_DEPARTMENT_OTHER): Admitting: Oncology

## 2024-08-10 ENCOUNTER — Other Ambulatory Visit: Payer: Self-pay

## 2024-08-10 ENCOUNTER — Inpatient Hospital Stay

## 2024-08-10 ENCOUNTER — Inpatient Hospital Stay: Attending: Internal Medicine

## 2024-08-10 VITALS — Wt 209.0 lb

## 2024-08-10 DIAGNOSIS — C3490 Malignant neoplasm of unspecified part of unspecified bronchus or lung: Secondary | ICD-10-CM | POA: Diagnosis not present

## 2024-08-10 DIAGNOSIS — J440 Chronic obstructive pulmonary disease with acute lower respiratory infection: Secondary | ICD-10-CM | POA: Diagnosis not present

## 2024-08-10 DIAGNOSIS — G629 Polyneuropathy, unspecified: Secondary | ICD-10-CM | POA: Diagnosis not present

## 2024-08-10 DIAGNOSIS — Z9221 Personal history of antineoplastic chemotherapy: Secondary | ICD-10-CM | POA: Diagnosis not present

## 2024-08-10 DIAGNOSIS — R21 Rash and other nonspecific skin eruption: Secondary | ICD-10-CM

## 2024-08-10 DIAGNOSIS — Z79899 Other long term (current) drug therapy: Secondary | ICD-10-CM | POA: Diagnosis not present

## 2024-08-10 DIAGNOSIS — K8020 Calculus of gallbladder without cholecystitis without obstruction: Secondary | ICD-10-CM | POA: Diagnosis not present

## 2024-08-10 DIAGNOSIS — J439 Emphysema, unspecified: Secondary | ICD-10-CM | POA: Diagnosis not present

## 2024-08-10 DIAGNOSIS — I7143 Infrarenal abdominal aortic aneurysm, without rupture: Secondary | ICD-10-CM | POA: Diagnosis not present

## 2024-08-10 DIAGNOSIS — I700 Atherosclerosis of aorta: Secondary | ICD-10-CM | POA: Diagnosis not present

## 2024-08-10 DIAGNOSIS — J441 Chronic obstructive pulmonary disease with (acute) exacerbation: Secondary | ICD-10-CM | POA: Diagnosis not present

## 2024-08-10 DIAGNOSIS — Z923 Personal history of irradiation: Secondary | ICD-10-CM | POA: Diagnosis not present

## 2024-08-10 DIAGNOSIS — J984 Other disorders of lung: Secondary | ICD-10-CM | POA: Diagnosis not present

## 2024-08-10 DIAGNOSIS — I2510 Atherosclerotic heart disease of native coronary artery without angina pectoris: Secondary | ICD-10-CM | POA: Diagnosis not present

## 2024-08-10 DIAGNOSIS — J90 Pleural effusion, not elsewhere classified: Secondary | ICD-10-CM | POA: Diagnosis not present

## 2024-08-10 DIAGNOSIS — C3491 Malignant neoplasm of unspecified part of right bronchus or lung: Secondary | ICD-10-CM | POA: Diagnosis not present

## 2024-08-10 DIAGNOSIS — Z9981 Dependence on supplemental oxygen: Secondary | ICD-10-CM | POA: Diagnosis not present

## 2024-08-10 DIAGNOSIS — Z7901 Long term (current) use of anticoagulants: Secondary | ICD-10-CM | POA: Diagnosis not present

## 2024-08-10 DIAGNOSIS — I4891 Unspecified atrial fibrillation: Secondary | ICD-10-CM | POA: Diagnosis not present

## 2024-08-10 LAB — CBC WITH DIFFERENTIAL/PLATELET
Abs Immature Granulocytes: 0.33 K/uL — ABNORMAL HIGH (ref 0.00–0.07)
Basophils Absolute: 0 K/uL (ref 0.0–0.1)
Basophils Relative: 0 %
Eosinophils Absolute: 0.2 K/uL (ref 0.0–0.5)
Eosinophils Relative: 2 %
HCT: 37.2 % — ABNORMAL LOW (ref 39.0–52.0)
Hemoglobin: 11.1 g/dL — ABNORMAL LOW (ref 13.0–17.0)
Immature Granulocytes: 3 %
Lymphocytes Relative: 6 %
Lymphs Abs: 0.7 K/uL (ref 0.7–4.0)
MCH: 24.2 pg — ABNORMAL LOW (ref 26.0–34.0)
MCHC: 29.8 g/dL — ABNORMAL LOW (ref 30.0–36.0)
MCV: 81.2 fL (ref 80.0–100.0)
Monocytes Absolute: 1.4 K/uL — ABNORMAL HIGH (ref 0.1–1.0)
Monocytes Relative: 12 %
Neutro Abs: 9.4 K/uL — ABNORMAL HIGH (ref 1.7–7.7)
Neutrophils Relative %: 77 %
Platelets: 176 K/uL (ref 150–400)
RBC: 4.58 MIL/uL (ref 4.22–5.81)
RDW: 16.8 % — ABNORMAL HIGH (ref 11.5–15.5)
WBC: 12.1 K/uL — ABNORMAL HIGH (ref 4.0–10.5)
nRBC: 0 % (ref 0.0–0.2)

## 2024-08-10 LAB — MAGNESIUM: Magnesium: 2.1 mg/dL (ref 1.7–2.4)

## 2024-08-10 LAB — COMPREHENSIVE METABOLIC PANEL WITH GFR
ALT: 26 U/L (ref 0–44)
AST: 21 U/L (ref 15–41)
Albumin: 3.7 g/dL (ref 3.5–5.0)
Alkaline Phosphatase: 38 U/L (ref 38–126)
Anion gap: 7 (ref 5–15)
BUN: 23 mg/dL (ref 8–23)
CO2: 34 mmol/L — ABNORMAL HIGH (ref 22–32)
Calcium: 9.1 mg/dL (ref 8.9–10.3)
Chloride: 99 mmol/L (ref 98–111)
Creatinine, Ser: 0.87 mg/dL (ref 0.61–1.24)
GFR, Estimated: >60 mL/min (ref >60)
Glucose, Bld: 92 mg/dL (ref 70–99)
Potassium: 4.3 mmol/L (ref 3.5–5.1)
Sodium: 139 mmol/L (ref 135–145)
Total Bilirubin: 0.6 mg/dL (ref 0.0–1.2)
Total Protein: 5.9 g/dL — ABNORMAL LOW (ref 6.5–8.1)

## 2024-08-10 LAB — TSH: TSH: 1.37 u[IU]/mL (ref 0.350–4.500)

## 2024-08-10 NOTE — Progress Notes (Signed)
 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  Currently on consolidation durvalumab .   - Patient was recently admitted to the hospital for community-acquired pneumonia.  This is the second admission since the start of immunotherapy.  CT scan also consistent with ground glass opacities.   - Considering patient has multiple hospital admissions with new ground glass opacities on CT scan, this can be likely secondary to immunotherapy.  Will hold immunotherapy at this time. - Labs reviewed today: CMP: Stable, CBC: WBC: 12.1, hemoglobin: 11.1, platelets: 176, TSH: Normal - Will consider surveillance per NCCN guidelines.  Will repeat CT scan in 3 months. - If patient requires further treatment in future, can consider immunotherapy but with caution.  Return to clinic in 3 months with a CT scan.   Chronic obstructive pulmonary disease (COPD) Well-managed with oxygen  therapy, maintaining 100% oxygen  saturation on two liters in office today on 3 L at home  - Continue oxygen  therapy  - Monitor for worsening respiratory symptoms.  Leukocytosis Likely secondary to steroid use.  Patient completed yesterday  - Continue to monitor  Community-acquired pneumonia Admitted to the hospital for A-fib and was found to have community-acquired  pneumonia.  Chest x-ray with findings consistent with pneumonia Improved with antibiotics and steroids Currently on 2 L via nasal cannula which is his baseline but uses 3 L at home  -Continue to monitor for now  Skin rash Likely immunotherapy induced rash.  Resolved at this time.  Atrial fibrillation Exacerbated by pneumonia. On Coumadin  with stable INR of 3.1. Cardiology appointment scheduled.  - Continue Coumadin  therapy. - Regularly monitor INR. - Attend cardiology appointment next week.  Peripheral neuropathy, feet Present but not severe.  Stable   The patient understands the plans discussed today and is in agreement with them.  He knows to contact our office if he develops concerns prior to his next appointment.  The total time spent in the appointment was 19 minutes for the encounter  with patient, including review of chart and various tests results, discussions about plan of care and coordination of care plan   I,Helena R Teague,acting as a scribe for Siad Davonna, MD.,have documented all relevant documentation on the behalf of Siad Davonna, MD,as directed by  Siad Davonna, MD while in the presence of Siad Davonna, MD.  I, Siad Davonna MD, have reviewed the above documentation for accuracy and completeness, and I agree with the above.     Siad Davonna, MD  [facility] [place] - A DEPT OF [facility] [address] [place] [address] Dept: [phone] Dept Fax: [phone]   Orders Placed This Encounter  Procedures   CT CHEST ABDOMEN PELVIS W CONTRAST    Standing Status:   Future    Expected Date:   11/10/2024    Expiration Date:  08/10/2025    If indicated for the ordered procedure, I authorize the administration of contrast media per Radiology protocol:   Yes    Does the patient have a contrast media/X-ray dye allergy?:   No    Preferred imaging location?:   [facility]    If  indicated for the ordered procedure, I authorize the administration of oral contrast media per Radiology protocol:   Yes     ONCOLOGY HISTORY:   I have reviewed his chart and materials related to his cancer extensively and collaborated history with the patient. Summary of oncologic history is as follows:  Left lung adenocarcinoma- Stage IA (T1b,N0,M0)  -06/01/2016: CT chest: 3.8 cm spiculated left upper lobe mass, suspicious for primary bronchogenic neoplasm. -06/19/2016: Initial PET: 3.3 x 1.4 cm hypermetabolic subsolid left upper lobe pulmonary nodule, compatible with primary bronchogenic neoplasm. No findings suspicious for metastatic disease. -07/03/2016: LUL lung biopsy. Pathology: Well differentiated adenocarcinoma. Cytology positive for adenocarcinoma.  -08/01/2016:  Left VATS, left upper lobectomy with mediastinal lymph node dissection.  Pathology: Invasive well differentiated adenocarcinoma, spanning 2.5 cm. No evidence of carcinoma in 4 of 4 lymph nodes (0/4). Surgical resections negative for carcinoma.   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 Although the SUV is only 1.9, this nodule is at the lower size limits for sensitive assessment by PET-CT, and accordingly even at this lower SUV concern is raised for the possibility of malignancy. The lesion has also increased in size compared to 04/09/2017, suspicious for low-grade malignancy. -12/09/2018: Right vats with wedge resection of right upper lobe nodule and bleb resection.  (Stage Ia) Pathology: Invasive moderately differentiatred adenocarcioma, 0.7 cm, acinar predominant. Negative for visceral pleural invasion. Resection margins negative for carcinoma. Negative for lymphovascular invasion. One out of one lymph node negative for metastatic carcinoma. Benign pleural blebs, negative for carcinoma. -03/02/2019-06/11/2022: Every 3-6  months CT scans with NED -07/08/2023: CT chest: Increasing masslike soft tissue along the suture margin in the central right upper lobe, indicative of disease recurrence. Slightly enlarged spiculated nodule in the lateral right lower lobe. Additional nodules are stable. Recommend continued attention on follow-up. -08/19/2023: PET: Recurrent bronchogenic carcinoma in the medial right upper lobe, along the resection margin. Faint 6 mm nodule in the superior segment right lower lobe, too small for PET resolution. Focal hypermetabolism in the central prostate.  -10/21/2023: CT super D chest: Solid 2.6 cm central right upper lobe pulmonary nodule along the wedge resection suture line is stable to minimally decreased since 07/08/2023 chest CT. Solid 1.3 cm and 0.7 cm peripheral right lower lobe pulmonary nodules are not substantially changed since 07/08/2023 chest CT. No new or progressive metastatic disease in the chest. No thoracic adenopathy. -10/24/2023: Right paratracheal lymph node FNA.  Cytology: Adenocarcinoma. -12/02/2023: MRI brain:  No strong evidence of cerebral metastatic disease on this noncontrast exam. -11/03/2023: Foundation 1 CDX:  Insufficient tissue -11/03/2023: PD-L1 22 C3 (TPS): 5% -12/04/2023: Foundation one blood NGS: TMB-low, MSI-high not detected, no other targetable mutations  -12/23/2023- 02/03/2024: Chemoradiation therapy with weekly carboplatin  and paclitaxel  -02/25/2024 - 07/08/2024: Consolidation durvalumab  every 2 weeks. 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 Extensive perihilar scarring and volume loss of the perihilar right lung unchanged, central, most nodular component similar at  2.2 x 1.6 cm. Unchanged right lower lobe nodules. No evidence of lymphadenopathy or metastatic disease in  the chest. -06/21/2024-06/26/2024: Admitted to the hospital for COPD with exacerbation, possible pneumonia, Afib -08/02/2024-08/05/2024: Admitted to the hospital for sepsis secondary to pneumonia and has scattered ground glass opacities on CT chest -08/02/2024: CT angio chest: No evidence of acute pulmonary embolism. Interval development of right upper lobe peripheral consolidation and scattered patchy ground-glass opacities in the posterior left lower lobe and peripheral right lower lobe, suspicious for multifocal pneumonia. No substantial change in size of right lower lobe nodules measuring up to 10 mm. Increased trace right pleural effusion. -08/02/2024: CT A&P: No acute abdominopelvic findings. Infrarenal abdominal aortic aneurysm measures 4.5 x 4.4 cm   Current Treatment:  Consolidation durvalumab  1500mg  every 4 weeks  INTERVAL HISTORY:   ELBY BLACKWELDER is here today for follow up for bilateral lung adenocarcinoma. Patient is accompanied by his wife.   We discussed discontinuing immunotherapy, as recurrent pulmonary inflammation and infections as well as worsened oxygen  saturation may be due to durvalumab . His breathing is stable to improved and he is currently on 3 L of oxygen  at home and 2 L at the office today. He has been on oxygen  since December 2024. His decreased oxygen  saturation was originally contributed to COPD flare ups. Jru has slightly elevated WBC, which is likely due to oral steroids prescribed for COPD exacerbation which he completed yesterday.   His immunotherapy-induced rash has almost completely resolved and he is not currently using his steroid cream, as his rash is well controlled.    I have reviewed the past medical history, past surgical history, social history and family history with the patient and they are unchanged from previous note.  ALLERGIES:  has no known allergies.  MEDICATIONS:  Current Outpatient Medications  Medication Sig Dispense Refill   albuterol   (VENTOLIN  HFA) 108 (90 Base) MCG/ACT inhaler Inhale 2 puffs into the lungs every 4 (four) hours as needed for wheezing or shortness of breath. 8 g 2   budesonide -glycopyrrolate -formoterol  (BREZTRI  AEROSPHERE) 160-9-4.8 MCG/ACT AERO inhaler Inhale 2 puffs into the lungs 2 (two) times daily.     cetirizine  (ZYRTEC ) 10 MG tablet Take 10 mg by mouth daily.     diltiazem  (CARDIZEM  CD) 120 MG 24 hr capsule Take 1 capsule (120 mg total) by mouth daily. 30 capsule 1   fenofibrate  (TRICOR ) 145 MG tablet Take 1 tablet (145 mg total) by mouth daily. 90 tablet 1   finasteride  (PROSCAR ) 5 MG tablet Take 1 tablet (5 mg total) by mouth daily. 30 tablet 0   guaiFENesin -dextromethorphan  (ROBITUSSIN DM) 100-10 MG/5ML syrup Take 10 mLs by mouth every 8 (eight) hours as needed for cough.     HYDROcodone  bit-homatropine (HYCODAN) 5-1.5 MG/5ML syrup Take 5 mLs by mouth every 6 (six) hours as needed for cough. 120 mL 0   hydrOXYzine  (ATARAX ) 25 MG tablet Take 1 tablet (25 mg total) by mouth every 8 (eight) hours as needed for itching. 30 tablet 5   ipratropium-albuterol  (DUONEB) 0.5-2.5 (3) MG/3ML SOLN Take 3 mLs by nebulization every 6 (six) hours as needed. 360 mL 1   magnesium  oxide (MAG-OX) 400 (240 Mg) MG tablet Take 1 tablet (400 mg total) by mouth 2 (two) times daily. 60 tablet 5   metoprolol  succinate (TOPROL  XL) 100 MG 24 hr tablet Take 1 tablet (100 mg total) by mouth daily. Take with or immediately following a meal. 30 tablet 1   OXYGEN  Inhale 2 L  into the lungs continuous. Hx of lung ca, copd, and emphysema     Palonosetron  HCl (ALOXI  IV) Inject into the vein.     rosuvastatin  (CRESTOR ) 40 MG tablet Take 1 tablet (40 mg total) by mouth daily. 90 tablet 3   tamsulosin  (FLOMAX ) 0.4 MG CAPS capsule Take 2 capsules (0.8 mg total) by mouth daily. 180 capsule 3   warfarin (COUMADIN ) 5 MG tablet Take 0.5-1 tablets (2.5-5 mg total) by mouth See admin instructions. Take 2.5 mg by mouth Tuesday and Friday and take 5 mg  on Monday, Wednesday, Thursday, Saturday and Sunday     No current facility-administered medications for this visit.    REVIEW OF SYSTEMS:   Constitutional: Denies fevers, chills or abnormal weight loss Eyes: Denies blurriness of vision Ears, nose, mouth, throat, and face: Denies mucositis or sore throat Respiratory: Denies cough, dyspnea or wheezes Cardiovascular: Denies palpitation, chest discomfort or lower extremity swelling Gastrointestinal:  Denies nausea, heartburn or change in bowel habits Skin: Denies abnormal skin rashes Lymphatics: Denies new lymphadenopathy or easy bruising Neurological:Denies numbness, tingling or new weaknesses Behavioral/Psych: Mood is stable, no new changes  All other systems were reviewed with the patient and are negative.   VITALS:  Weight 209 lb (94.8 kg).  Wt Readings from Last 3 Encounters:  08/10/24 209 lb (94.8 kg)  08/03/24 205 lb 14.6 oz (93.4 kg)  07/14/24 206 lb 9.6 oz (93.7 kg)    Body mass index is 30.86 kg/m.  Performance status (ECOG): 2 - Symptomatic, <50% confined to bed  PHYSICAL EXAM:   GENERAL:alert, no distress and comfortable, on oxygen  via nasal cannula SKIN: skin color, texture, turgor are normal, no rashes or significant lesions EYES: normal, Conjunctiva are pink and non-injected, sclera clear  LUNGS: clear to auscultation and percussion with normal breathing effort HEART: regular rate & rhythm and no murmurs and no lower extremity edema ABDOMEN:abdomen soft, non-tender and normal bowel sounds Musculoskeletal:no cyanosis of digits and no clubbing  NEURO: alert & oriented x 3 with fluent speech  LABORATORY DATA:  I have reviewed the data as listed  Lab Results  Component Value Date   WBC 12.1 (H) 08/10/2024   NEUTROABS 9.4 (H) 08/10/2024   HGB 11.1 (L) 08/10/2024   HCT 37.2 (L) 08/10/2024   MCV 81.2 08/10/2024   PLT 176 08/10/2024      Chemistry      Component Value Date/Time   NA 139 08/10/2024 0926    NA 140 07/15/2023 1142   NA 136 04/09/2017 1249   K 4.3 08/10/2024 0926   K 4.6 04/09/2017 1249   CL 99 08/10/2024 0926   CO2 34 (H) 08/10/2024 0926   CO2 27 04/09/2017 1249   BUN 23 08/10/2024 0926   BUN 13 07/15/2023 1142   BUN 23.9 04/09/2017 1249   CREATININE 0.87 08/10/2024 0926   CREATININE 1.10 11/26/2023 1040   CREATININE 1.21 (H) 05/08/2020 1051   CREATININE 1.4 (H) 04/09/2017 1249      Component Value Date/Time   CALCIUM  9.1 08/10/2024 0926   CALCIUM  9.6 04/09/2017 1249   ALKPHOS 38 08/10/2024 0926   ALKPHOS 58 04/09/2017 1249   AST 21 08/10/2024 0926   AST 20 11/26/2023 1040   AST 13 04/09/2017 1249   ALT 26 08/10/2024 0926   ALT 19 11/26/2023 1040   ALT 9 04/09/2017 1249   BILITOT 0.6 08/10/2024 0926   BILITOT 0.7 11/26/2023 1040   BILITOT 1.01 04/09/2017 1249  Latest Reference Range & Units 08/10/24 09:26  TSH 0.350 - 4.500 uIU/mL 1.370   RADIOGRAPHIC STUDIES: I have personally reviewed the radiological images as listed and agreed with the findings in the report.  CT ABDOMEN PELVIS W CONTRAST CLINICAL DATA:  Two day history of epigastric pain and weakness  EXAM: CT ABDOMEN AND PELVIS WITH CONTRAST  TECHNIQUE: Multidetector CT imaging of the abdomen and pelvis was performed using the standard protocol following bolus administration of intravenous contrast.  RADIATION DOSE REDUCTION: This exam was performed according to the departmental dose-optimization program which includes automated exposure control, adjustment of the mA and/or kV according to patient size and/or use of iterative reconstruction technique.  CONTRAST:  75mL OMNIPAQUE  IOHEXOL  350 MG/ML SOLN  COMPARISON:  CT abdomen and pelvis dated 07/14/2021, ultrasound aorta dated 10/28/2023  FINDINGS: Lower chest: Please see separately dictated CTA chest report for detailed findings.  Hepatobiliary: No focal hepatic lesions. No intra or extrahepatic biliary ductal dilation.  Cholelithiasis  Pancreas: No focal lesions or main ductal dilation.  Spleen: Normal in size without focal abnormality.  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 No right adrenal nodule. Bilateral simple/minimally complicated cysts measuring up to 1.4 cm in the lower pole right kidney (10:34). No hydronephrosis or calculi. No focal bladder wall thickening.  Stomach/Bowel: Normal appearance of the stomach. No evidence of bowel wall thickening, distention, or inflammatory changes. Colonic diverticulosis without acute diverticulitis. Postsurgical changes of appendectomy.  Vascular/Lymphatic: Aortic atherosclerosis. Infrarenal abdominal aortic aneurysm measures 4.5 x 4.4 cm, not substantially changed from 10/28/2023. No enlarged abdominal or pelvic lymph nodes.  Reproductive: Prostate is unremarkable.  Other: No free fluid, fluid collection, or free air.  Musculoskeletal: No acute or abnormal lytic or blastic osseous lesions. Small fat-containing bilateral inguinal hernias.  IMPRESSION: 1. No acute abdominopelvic findings. 2. Cholelithiasis without evidence of acute cholecystitis. 3. Infrarenal abdominal aortic aneurysm measures 4.5 x 4.4 cm, not substantially changed from 10/28/2023. Recommend follow-up CT or MR as appropriate in 12 months and referral to or continued care with vascular specialist. (Ref.: J Vasc Surg. 2018; 67:2-77 and J Am Coll Radiol 2013;10(10):789-794.) 4.  Aortic Atherosclerosis (ICD10-I70.0).  Electronically Signed   By: Limin  Xu M.D.   On: 08/02/2024 13:29 CT Angio Chest PE W and/or Wo Contrast CLINICAL DATA:  Two day history of central chest pain and shortness of breath. History of recurrent right lung adenocarcinoma.  EXAM: CT ANGIOGRAPHY CHEST WITH CONTRAST  TECHNIQUE: Multidetector CT imaging of the chest was performed using the standard protocol during bolus administration of intravenous contrast. Multiplanar CT image  reconstructions and MIPs were obtained to evaluate the vascular anatomy.  RADIATION DOSE REDUCTION: This exam was performed according to the departmental dose-optimization program which includes automated exposure control, adjustment of the mA and/or kV according to patient size and/or use of iterative reconstruction technique.  CONTRAST:  75mL OMNIPAQUE  IOHEXOL  350 MG/ML SOLN  COMPARISON:  Same day chest radiograph, CT chest dated 07/30/2024  FINDINGS: Cardiovascular: Partially imaged left chest wall port tip terminates in the right atrium. The study is high quality for the evaluation of pulmonary embolism. There are no filling defects in the central, lobar, segmental or subsegmental pulmonary artery branches to suggest acute pulmonary embolism. Great vessels are normal in course and caliber. Normal heart size. No significant pericardial fluid/thickening. Coronary artery calcifications and aortic atherosclerosis.  Mediastinum/Nodes: Imaged thyroid  gland without nodules meeting criteria for imaging follow-up by size. Mildly patulous esophagus. No pathologically enlarged axillary, supraclavicular, mediastinal, or  hilar lymph nodes.  Lungs/Pleura: The central airways are patent. Severe centrilobular and paraseptal emphysema. Postsurgical changes of left upper lobectomy and right upper lobe wedge resection. Similar consolidation and architectural distortion related to prior radiation treatment of the right lung. No substantial change in size of right lower lobe nodules measuring 9 x 6 mm (lung series 4, uploaded to the CT abdomen pelvis accession image 94) and 10 x 6 mm (4:96). Interval development of right upper lobe peripheral consolidation and ground-glass opacity (4:37) and scattered patchy ground-glass opacities in the posterior left lower lobe and peripheral right lower lobe. No pneumothorax. Unchanged loculated pleural effusion in the right major fissure. Increased trace  right pleural effusion.  Upper abdomen: Please see separately dictated CT abdomen and pelvis report for detailed findings.  Musculoskeletal: No acute or abnormal lytic or blastic osseous lesions. Multilevel degenerative changes of the thoracic spine.  Review of the MIP images confirms the above findings.  IMPRESSION: 1. No evidence of acute pulmonary embolism. 2. Interval development of right upper lobe peripheral consolidation and scattered patchy ground-glass opacities in the posterior left lower lobe and peripheral right lower lobe, suspicious for multifocal pneumonia. 3. No substantial change in size of right lower lobe nodules measuring up to 10 mm. 4. Increased trace right pleural effusion. 5. Aortic Atherosclerosis (ICD10-I70.0) and Emphysema (ICD10-J43.9). Coronary artery calcifications. Assessment for potential risk factor modification, dietary therapy or pharmacologic therapy may be warranted, if clinically indicated.  Electronically Signed   By: Limin  Xu M.D.   On: 08/02/2024 13:20 DG Chest Port 1 View EXAM: 1 VIEW(S) XRAY OF THE CHEST 08/02/2024 07:55:00 AM  COMPARISON: 06/21/24  CLINICAL HISTORY: cp dib  FINDINGS:  LINES, TUBES AND DEVICES: Left chest port with tip in distal Superior Cava.  LUNGS AND PLEURA: Right lung surgical suture chains. Right upper and right lower lobes pulmonary opacities have improved in the interval. Emphysema. Small right pleural effusion. No pulmonary edema. No pneumothorax.  HEART AND MEDIASTINUM: Aortic calcification. No acute abnormality of the cardiac and mediastinal silhouettes.  BONES AND SOFT TISSUES: No acute osseous abnormality.  IMPRESSION: 1. Mildly increased septal markings within the periphery of the right base concerning for asymmetric edema versus chronic postinflammatory change . 2. Small loculated right pleural effusion with fluid trapped along the minor fissure, similar to the previous exam. 3.  Resolution of previous right upper and right lower lobe airspace opacities.  Electronically signed by: Waddell Calk MD 08/02/2024 08:09 AM EST RP Workstation: [id number]

## 2024-08-10 NOTE — Patient Instructions (Signed)
 [facility] Discharge Instructions   You were seen and examined today by Dr. Davonna.  She reviewed the results of your lab work which are normal/stable.   We will hold your treatment indefinitely. Dr. Davonna believes the immunotherapy may be causing you more harm than good. We will closely monitor you every 3 months with CT scan to keep an eye on the lung cancer.    Return as scheduled.    Thank you for choosing [facility] to provide your oncology and hematology care.  To afford each patient quality time with our provider, please arrive at least 15 minutes before your scheduled appointment time.   If you have a lab appointment with the Cancer Center please come in thru the Main Entrance and check in at the main information desk.  You need to re-schedule your appointment should you arrive 10 or more minutes late.  We strive to give you quality time with our providers, and arriving late affects you and other patients whose appointments are after yours.  Also, if you no show three or more times for appointments you may be dismissed from the clinic at the providers discretion.     Again, thank you for choosing [facility].  Our hope is that these requests will decrease the amount of time that you wait before being seen by our physicians.       _____________________________________________________________  Should you have questions after your visit to [facility], please contact our office at [phone] and follow the prompts.  Our office hours are 8:00 a.m. and 4:30 p.m. Monday - Friday.  Please note that voicemails left after 4:00 p.m. may not be returned until the following business day.  We are closed weekends and major holidays.  You do have access to a nurse 24-7, just call the main number to the clinic [phone] and do not press any options, hold on the line and a nurse will answer the  phone.    For prescription refill requests, have your pharmacy contact our office and allow 72 hours.    Due to Covid, you will need to wear a mask upon entering the hospital. If you do not have a mask, a mask will be given to you at the Main Entrance upon arrival. For doctor visits, patients may have 1 support person age 51 or older with them. For treatment visits, patients can not have anyone with them due to social distancing guidelines and our immunocompromised population.

## 2024-08-10 NOTE — Progress Notes (Signed)
 We will be holding Imfinizi treatment indefinitely per Dr.Kandala.

## 2024-08-11 ENCOUNTER — Other Ambulatory Visit: Payer: Self-pay

## 2024-08-11 ENCOUNTER — Encounter: Payer: Self-pay | Admitting: Oncology

## 2024-08-11 LAB — T4: T4, Total: 7.1 ug/dL (ref 4.5–12.0)

## 2024-08-12 DIAGNOSIS — E1142 Type 2 diabetes mellitus with diabetic polyneuropathy: Secondary | ICD-10-CM | POA: Diagnosis not present

## 2024-08-12 DIAGNOSIS — J440 Chronic obstructive pulmonary disease with acute lower respiratory infection: Secondary | ICD-10-CM | POA: Diagnosis not present

## 2024-08-12 DIAGNOSIS — E1151 Type 2 diabetes mellitus with diabetic peripheral angiopathy without gangrene: Secondary | ICD-10-CM | POA: Diagnosis not present

## 2024-08-12 DIAGNOSIS — I714 Abdominal aortic aneurysm, without rupture, unspecified: Secondary | ICD-10-CM | POA: Diagnosis not present

## 2024-08-12 DIAGNOSIS — I4891 Unspecified atrial fibrillation: Secondary | ICD-10-CM | POA: Diagnosis not present

## 2024-08-12 DIAGNOSIS — J189 Pneumonia, unspecified organism: Secondary | ICD-10-CM | POA: Diagnosis not present

## 2024-08-13 DIAGNOSIS — I714 Abdominal aortic aneurysm, without rupture, unspecified: Secondary | ICD-10-CM | POA: Diagnosis not present

## 2024-08-13 DIAGNOSIS — J440 Chronic obstructive pulmonary disease with acute lower respiratory infection: Secondary | ICD-10-CM | POA: Diagnosis not present

## 2024-08-13 DIAGNOSIS — J189 Pneumonia, unspecified organism: Secondary | ICD-10-CM | POA: Diagnosis not present

## 2024-08-13 DIAGNOSIS — I4891 Unspecified atrial fibrillation: Secondary | ICD-10-CM | POA: Diagnosis not present

## 2024-08-13 DIAGNOSIS — E1151 Type 2 diabetes mellitus with diabetic peripheral angiopathy without gangrene: Secondary | ICD-10-CM | POA: Diagnosis not present

## 2024-08-13 DIAGNOSIS — E1142 Type 2 diabetes mellitus with diabetic polyneuropathy: Secondary | ICD-10-CM | POA: Diagnosis not present

## 2024-08-16 DIAGNOSIS — I4891 Unspecified atrial fibrillation: Secondary | ICD-10-CM | POA: Diagnosis not present

## 2024-08-16 DIAGNOSIS — I714 Abdominal aortic aneurysm, without rupture, unspecified: Secondary | ICD-10-CM | POA: Diagnosis not present

## 2024-08-16 DIAGNOSIS — E1151 Type 2 diabetes mellitus with diabetic peripheral angiopathy without gangrene: Secondary | ICD-10-CM | POA: Diagnosis not present

## 2024-08-16 DIAGNOSIS — J440 Chronic obstructive pulmonary disease with acute lower respiratory infection: Secondary | ICD-10-CM | POA: Diagnosis not present

## 2024-08-16 DIAGNOSIS — E1142 Type 2 diabetes mellitus with diabetic polyneuropathy: Secondary | ICD-10-CM | POA: Diagnosis not present

## 2024-08-16 DIAGNOSIS — J189 Pneumonia, unspecified organism: Secondary | ICD-10-CM | POA: Diagnosis not present

## 2024-08-17 ENCOUNTER — Telehealth: Payer: Self-pay | Admitting: Cardiology

## 2024-08-17 ENCOUNTER — Ambulatory Visit (INDEPENDENT_AMBULATORY_CARE_PROVIDER_SITE_OTHER): Payer: Self-pay | Admitting: *Deleted

## 2024-08-17 DIAGNOSIS — Z5181 Encounter for therapeutic drug level monitoring: Secondary | ICD-10-CM

## 2024-08-17 DIAGNOSIS — I4891 Unspecified atrial fibrillation: Secondary | ICD-10-CM

## 2024-08-17 LAB — POCT INR: INR: 3 (ref 2.0–3.0)

## 2024-08-17 NOTE — Patient Instructions (Signed)
 Continue warfarin 1/2 tablet daily except 1 tablet on Mondays and Fridays Recheck in 1 wk. Orders given to [facility] RN [facility]

## 2024-08-17 NOTE — Telephone Encounter (Signed)
 Called and spoke to [facility].  Coumadin  orders given.  See coumadin  note.

## 2024-08-17 NOTE — Telephone Encounter (Signed)
 Stephanie from [facility] calling to reports pts INR: 3.0

## 2024-08-23 ENCOUNTER — Other Ambulatory Visit: Payer: Self-pay | Admitting: Internal Medicine

## 2024-08-23 ENCOUNTER — Ambulatory Visit (INDEPENDENT_AMBULATORY_CARE_PROVIDER_SITE_OTHER): Payer: Self-pay | Admitting: *Deleted

## 2024-08-23 ENCOUNTER — Ambulatory Visit: Payer: Self-pay

## 2024-08-23 ENCOUNTER — Telehealth: Payer: Self-pay | Admitting: Cardiology

## 2024-08-23 ENCOUNTER — Telehealth: Payer: Self-pay

## 2024-08-23 DIAGNOSIS — Z5181 Encounter for therapeutic drug level monitoring: Secondary | ICD-10-CM

## 2024-08-23 DIAGNOSIS — E1151 Type 2 diabetes mellitus with diabetic peripheral angiopathy without gangrene: Secondary | ICD-10-CM | POA: Diagnosis not present

## 2024-08-23 DIAGNOSIS — I4891 Unspecified atrial fibrillation: Secondary | ICD-10-CM | POA: Diagnosis not present

## 2024-08-23 DIAGNOSIS — J189 Pneumonia, unspecified organism: Secondary | ICD-10-CM | POA: Diagnosis not present

## 2024-08-23 DIAGNOSIS — E1142 Type 2 diabetes mellitus with diabetic polyneuropathy: Secondary | ICD-10-CM | POA: Diagnosis not present

## 2024-08-23 DIAGNOSIS — J441 Chronic obstructive pulmonary disease with (acute) exacerbation: Secondary | ICD-10-CM

## 2024-08-23 DIAGNOSIS — I714 Abdominal aortic aneurysm, without rupture, unspecified: Secondary | ICD-10-CM | POA: Diagnosis not present

## 2024-08-23 DIAGNOSIS — J440 Chronic obstructive pulmonary disease with acute lower respiratory infection: Secondary | ICD-10-CM | POA: Diagnosis not present

## 2024-08-23 LAB — POCT INR: INR: 4.7 — AB (ref 2.0–3.0)

## 2024-08-23 NOTE — Telephone Encounter (Signed)
 Caller Clabe) called to report patient's INR results.

## 2024-08-23 NOTE — Telephone Encounter (Signed)
 FYI Only or Action Required?: Action required by provider: update on patient condition.  Patient was last seen in primary care on 05/11/2024 by Randall Suzzane POUR, MD.  Called Nurse Triage reporting Wheezing and Shortness of Breath.  Symptoms began several days ago.  Interventions attempted: Prescription medications: Duoneb and Breztri .  Symptoms are: unchanged.  Triage Disposition: See PCP When Office is Open (Within 3 Days)  Patient/caregiver understands and will follow disposition?:   Copied from CRM #[id number]. Topic: Clinical - Red Word Triage >> Aug 23, 2024  3:03 PM Fonda T wrote: [facility] that prompted transfer to Nurse Triage: [name] with [facility] calling in regards to pt with concerns, after giving verbal orders for pt.  States he is still having some wheezing in right lung, with shortness of breathe, and difficulty breathing, and wants to know if they can do a mobile xray, to enusre no pneumonia has set in his lungs. Reason for Disposition  [1] MODERATE longstanding difficulty breathing (e.g., speaks in phrases, SOB even at rest, pulse 100-120) AND [2] SAME as normal  Answer Assessment - Initial Assessment Questions Home health nurse requesting portable chest xray. Home health nurse called to say she heard wheezing on osculation and patient having SOB. Is taking Hycodan for a cough, 96% 2L per home health. Has been on 3L because his tubing is so long. Pulse 76. Temp 98.3 Wife states the last two days he took an extra treatment of Duoneb. Taking breztri  as normal. Wife states he's been more tired and more SOB with activity. States they have stopped his immunotherapy recently.   1. RESPIRATORY STATUS: Describe your breathing? (e.g., wheezing, shortness of breath, unable to speak, severe coughing)      Shortness of breath, dry cough, wheezing 2. ONSET: When did this breathing problem begin?      Months of pneumonia, home care nurse said wife states SOB for a few days   3. PATTERN Does the difficult breathing come and go, or has it been constant since it started?      Comes and goes 4. SEVERITY: How bad is your breathing? (e.g., mild, moderate, severe)      Moderate, worse when walking 5. RECURRENT SYMPTOM: Have you had difficulty breathing before? If Yes, ask: When was the last time? and What happened that time?      Has had wheezing since his discharge from the hospital. States more SOB the last few days and oxygen  dropped into the 80's when walking to bathroom. Hasn't been in the 80's. 6. CARDIAC HISTORY: Do you have any history of heart disease? (e.g., heart attack, angina, bypass surgery, angioplasty)      A fib, AAA 7. LUNG HISTORY: Do you have any history of lung disease?  (e.g., pulmonary embolus, asthma, emphysema)     Lung cancer, COPD 8. CAUSE: What do you think is causing the breathing problem?      Has had pneumonia twice recently, lung cancer 9. OTHER SYMPTOMS: Do you have any other symptoms? (e.g., chest pain, cough, dizziness, fever, runny nose)     Cough 10. O2 SATURATION MONITOR:  Do you use an oxygen  saturation monitor (pulse oximeter) at home? If Yes, ask: What is your reading (oxygen  level) today? What is your usual oxygen  saturation reading? (e.g., 95%)       96% on 2L  Protocols used: Breathing Difficulty-A-AH

## 2024-08-23 NOTE — Telephone Encounter (Signed)
 Called and spoke with [name] RN [facility].  Coumadin  orders given.  See coumadin  note.

## 2024-08-23 NOTE — Progress Notes (Signed)
 INR-4.7; Please see anticoagulation encounter

## 2024-08-23 NOTE — Telephone Encounter (Signed)
 Copied from CRM #[id number]. Topic: Clinical - Home Health Verbal Orders >> Aug 23, 2024  2:59 PM Fonda T wrote: Caller/Agency: [name] Callback Number: [phone] Service Requested: Skilled Nursing Frequency: once a week for nine weeks for cardiopulmonary assessment, medications, nutrition, safety, and PT/INR checks which are being followed by Dr. Edison Any new concerns about the patient? Yes Pt is having some wheezing in his right lung. Call transferred to NT for this issue.

## 2024-08-23 NOTE — Patient Instructions (Signed)
 Hold warfarin tonight and tomorrow night then decrease dose to 1/2 tablet daily except 1 tablet on Fridays Recheck in 1 wk. Orders given to [name] RN [facility]

## 2024-08-23 NOTE — Progress Notes (Signed)
 INR 3.0; Please see anticoagulation encounter

## 2024-08-24 ENCOUNTER — Encounter: Payer: Self-pay | Admitting: Internal Medicine

## 2024-08-24 ENCOUNTER — Telehealth: Admitting: Internal Medicine

## 2024-08-24 DIAGNOSIS — J441 Chronic obstructive pulmonary disease with (acute) exacerbation: Secondary | ICD-10-CM

## 2024-08-24 MED ORDER — AMOXICILLIN-POT CLAVULANATE 875-125 MG PO TABS: 1.0000 | ORAL_TABLET | Freq: Two times a day (BID) | ORAL | 0 refills | Status: DC

## 2024-08-24 MED ORDER — PREDNISONE 20 MG PO TABS: ORAL_TABLET | ORAL | 0 refills | Status: AC

## 2024-08-24 NOTE — Assessment & Plan Note (Addendum)
 Recent hospitalization for COPD exacerbation, did not take oral prednisone  after discharge Symptoms worse for the last 2 days Started empiric Augmentin  due to recent episode of multifocal pneumonia (avoided azithromycin  due to supratherapeutic INR recently, on Coumadin ) Started oral prednisone  taper Continue Breztri  Continue DuoNeb as needed for dyspnea or wheezing Robitussin as needed for cough Check CXR

## 2024-08-24 NOTE — Patient Instructions (Addendum)
 Please start taking Augmentin  as prescribed.  Please start taking Prednisone  as prescribed.

## 2024-08-24 NOTE — Progress Notes (Signed)
 Virtual Visit via Video Note   Because of Randall Sullivan's co-morbid illnesses, he is at least at moderate risk for complications without adequate follow up.  This format is felt to be most appropriate for this patient at this time.  All issues noted in this document were discussed and addressed.  A limited physical exam was performed with this format.      Evaluation Performed:  Follow-up visit  Date:  08/24/2024   ID:  Randall, Sullivan 1951-05-19, MRN [id number]  Patient Location: Home Provider Location: Office/Clinic  Participants: Patient Location of Patient: Home Location of Provider: Telehealth Consent was obtain for visit to be over via telehealth. I verified that I am speaking with the correct person using two identifiers.  PCP:  Tobie Suzzane POUR, MD   Chief Complaint: Cough and wheezing  History of Present Illness:    Randall Sullivan is a 73 y.o. male who has a video visit for complaint of cough and wheezing for the last 2 days.  He has been using Breztri  and DuoNeb for COPD.  Denies any fever or chills.  Of note, he was hospitalized 3 weeks ago for multifocal pneumonia.  The patient does not have symptoms concerning for COVID-19 infection (fever, chills, cough, or new shortness of breath).   Past Medical, Surgical, Social History, Allergies, and Medications have been Reviewed.  Past Medical History:  Diagnosis Date   A-fib [facility]) 2017   after last lung surgery patient had a history if Afib documented in hospital    AAA (abdominal aortic aneurysm)    4.0 cm 09/17/22 US    Acute appendicitis 07/14/2021   Arthritis    back & legs ( knees)   Cancer (HCC)    lung   Complication of anesthesia    agitated upon waking from anesth.    Dyspnea    Dysrhythmia    A. Fib   Emphysema    Emphysema of lung (HCC)    History of kidney stones    Hyperlipidemia    Hypertension    Oxygen  dependent    2L continuous   Peripheral vascular disease    Aortic Aneurysm    Pneumonia    Pre-diabetes    Sleep apnea    No OSA, but wears 2L [place] O2 continuous including at night   Stroke [facility] 2022   Right sided weakness, decreased hearing, balance and memory loss   Tobacco abuse    Past Surgical History:  Procedure Laterality Date   CARDIOVERSION N/A 06/25/2024   Procedure: CARDIOVERSION;  Surgeon: Alvan Dorn FALCON, MD;  Location: [facility];  Service: Endoscopy;  Laterality: N/A;   HERNIA REPAIR Bilateral 1999   umbilical   IR IMAGING GUIDED PORT INSERTION  12/09/2023   LAPAROSCOPIC APPENDECTOMY N/A 07/15/2021   Procedure: APPENDECTOMY LAPAROSCOPIC;  Surgeon: Kallie Manuelita BROCKS, MD;  Location: [facility];  Service: General;  Laterality: N/A;   VIDEO ASSISTED THORACOSCOPY Right 12/21/2018   Procedure: VIDEO ASSISTED THORACOSCOPY AND RESECTION OF BLEBS RIGHT LOWER LOBE;  Surgeon: Kerrin Elspeth BROCKS, MD;  Location: [facility] OR;  Service: Thoracic;  Laterality: Right;   VIDEO ASSISTED THORACOSCOPY (VATS)/ LOBECTOMY Left 08/01/2016   Procedure: VIDEO ASSISTED THORACOSCOPY (VATS)/ LEFT UPPER LOBECTOMY with lymph node sampling and onQ placement;  Surgeon: Elspeth BROCKS Kerrin, MD;  Location: [facility] OR;  Service: Thoracic;  Laterality: Left;   VIDEO ASSISTED THORACOSCOPY (VATS)/WEDGE RESECTION Right 12/09/2018   Procedure: VIDEO ASSISTED THORACOSCOPY (VATS)/WEDGE RESECTION;  Surgeon: Kerrin Elspeth  C, MD;  Location: [facility] OR;  Service: Thoracic;  Laterality: Right;   VIDEO BRONCHOSCOPY Bilateral 12/21/2018   Procedure: VIDEO BRONCHOSCOPY;  Surgeon: Kerrin Elspeth BROCKS, MD;  Location: [facility] OR;  Service: Thoracic;  Laterality: Bilateral;   VIDEO BRONCHOSCOPY WITH ENDOBRONCHIAL NAVIGATION N/A 07/03/2016   Procedure: VIDEO BRONCHOSCOPY WITH ENDOBRONCHIAL NAVIGATION;  Surgeon: Elspeth BROCKS Kerrin, MD;  Location: [facility];  Service: Thoracic;  Laterality: N/A;   VIDEO BRONCHOSCOPY WITH ENDOBRONCHIAL NAVIGATION N/A 10/24/2023   Procedure: VIDEO BRONCHOSCOPY WITH ENDOBRONCHIAL NAVIGATION;   Surgeon: Kerrin Elspeth BROCKS, MD;  Location: [facility];  Service: Thoracic;  Laterality: N/A;   VIDEO BRONCHOSCOPY WITH ENDOBRONCHIAL ULTRASOUND N/A 10/24/2023   Procedure: VIDEO BRONCHOSCOPY WITH ENDOBRONCHIAL ULTRASOUND;  Surgeon: Kerrin Elspeth BROCKS, MD;  Location: [facility];  Service: Thoracic;  Laterality: N/A;   VIDEO BRONCHOSCOPY WITH INSERTION OF INTERBRONCHIAL VALVE (IBV) N/A 12/11/2018   Procedure: VIDEO BRONCHOSCOPY WITH INSERTION OF INTERBRONCHIAL VALVE (IBV);  Surgeon: Kerrin Elspeth BROCKS, MD;  Location: [facility] OR;  Service: Thoracic;  Laterality: N/A;   VIDEO BRONCHOSCOPY WITH INSERTION OF INTERBRONCHIAL VALVE (IBV) N/A 02/10/2019   Procedure: VIDEO BRONCHOSCOPY WITH REMOVAL OF INTERBRONCHIAL VALVE (IBV);  Surgeon: Kerrin Elspeth BROCKS, MD;  Location: [facility] OR;  Service: Thoracic;  Laterality: N/A;     Current Meds  Medication Sig   albuterol  (VENTOLIN  HFA) 108 (90 Base) MCG/ACT inhaler Inhale 2 puffs into the lungs every 4 (four) hours as needed for wheezing or shortness of breath.   budesonide -glycopyrrolate -formoterol  (BREZTRI  AEROSPHERE) 160-9-4.8 MCG/ACT AERO inhaler Inhale 2 puffs into the lungs 2 (two) times daily.   cetirizine  (ZYRTEC ) 10 MG tablet Take 10 mg by mouth daily.   diltiazem  (CARDIZEM  CD) 120 MG 24 hr capsule Take 1 capsule (120 mg total) by mouth daily.   fenofibrate  (TRICOR ) 145 MG tablet Take 1 tablet (145 mg total) by mouth daily.   finasteride  (PROSCAR ) 5 MG tablet Take 1 tablet (5 mg total) by mouth daily.   guaiFENesin -dextromethorphan  (ROBITUSSIN DM) 100-10 MG/5ML syrup Take 10 mLs by mouth every 8 (eight) hours as needed for cough.   HYDROcodone  bit-homatropine (HYCODAN) 5-1.5 MG/5ML syrup Take 5 mLs by mouth every 6 (six) hours as needed for cough.   hydrOXYzine  (ATARAX ) 25 MG tablet Take 1 tablet (25 mg total) by mouth every 8 (eight) hours as needed for itching.   ipratropium-albuterol  (DUONEB) 0.5-2.5 (3) MG/3ML SOLN Take 3 mLs by nebulization every 6 (six)  hours as needed.   magnesium  oxide (MAG-OX) 400 (240 Mg) MG tablet Take 1 tablet (400 mg total) by mouth 2 (two) times daily.   metoprolol  succinate (TOPROL  XL) 100 MG 24 hr tablet Take 1 tablet (100 mg total) by mouth daily. Take with or immediately following a meal.   OXYGEN  Inhale 2 L into the lungs continuous. Hx of lung ca, copd, and emphysema   Palonosetron  HCl (ALOXI  IV) Inject into the vein.   rosuvastatin  (CRESTOR ) 40 MG tablet Take 1 tablet (40 mg total) by mouth daily.   tamsulosin  (FLOMAX ) 0.4 MG CAPS capsule Take 2 capsules (0.8 mg total) by mouth daily.   warfarin (COUMADIN ) 5 MG tablet Take 0.5-1 tablets (2.5-5 mg total) by mouth See admin instructions. Take 2.5 mg by mouth Tuesday and Friday and take 5 mg on Monday, Wednesday, Thursday, Saturday and Sunday     Allergies:   Patient has no known allergies.   ROS:   Please see the history of present illness. All other systems reviewed and are negative.  Labs/Other Tests and Data Reviewed:    Recent Labs: 06/24/2024: B Natriuretic Peptide 379.0 08/02/2024: Pro Brain Natriuretic Peptide 2,402.0 08/10/2024: ALT 26; BUN 23; Creatinine, Ser 0.87; Hemoglobin 11.1; Magnesium  2.1; Platelets 176; Potassium 4.3; Sodium 139; TSH 1.370   Recent Lipid Panel Lab Results  Component Value Date/Time   CHOL 95 06/22/2024 04:01 AM   CHOL 188 07/15/2023 11:42 AM   TRIG 74 06/22/2024 04:01 AM   HDL 37 (L) 06/22/2024 04:01 AM   HDL 33 (L) 07/15/2023 11:42 AM   CHOLHDL 2.6 06/22/2024 04:01 AM   LDLCALC 43 06/22/2024 04:01 AM   LDLCALC 101 (H) 07/15/2023 11:42 AM   LDLCALC  05/08/2020 10:51 AM     Comment:     . LDL cholesterol not calculated. Triglyceride levels greater than 400 mg/dL invalidate calculated LDL results. . Reference range: <100 . Desirable range <100 mg/dL for primary prevention;   <70 mg/dL for patients with CHD or diabetic patients  with > or = 2 CHD risk factors. SABRA LDL-C is now calculated using the Martin-Hopkins   calculation, which is a validated novel method providing  better accuracy than the Friedewald equation in the  estimation of LDL-C.  Gladis APPLETHWAITE et al. SANDREA. 7986;689(80): 2061-2068  (http://education.[employer].com/faq/FAQ164)    LDLDIRECT 75.9 03/12/2021 05:25 PM    Wt Readings from Last 3 Encounters:  08/10/24 209 lb (94.8 kg)  08/03/24 205 lb 14.6 oz (93.4 kg)  07/14/24 206 lb 9.6 oz (93.7 kg)     Objective:    Vital Signs:  There were no vitals taken for this visit.   VITAL SIGNS:  reviewed GEN:  no acute distress EYES:  sclerae anicteric, EOMI - Extraocular Movements Intact RESPIRATORY:  Mild wheezing, but able to speak in full sentences. NEURO:  alert and oriented x 3, no obvious focal deficit PSYCH:  normal affect  ASSESSMENT & PLAN:    Assessment & Plan COPD exacerbation (HCC) Recent hospitalization for COPD exacerbation, did not take oral prednisone  after discharge Symptoms worse for the last 2 days Started empiric Augmentin  due to recent episode of multifocal pneumonia (avoided azithromycin  due to supratherapeutic INR recently, on Coumadin ) Started oral prednisone  taper Continue Breztri  Continue DuoNeb as needed for dyspnea or wheezing Robitussin as needed for cough Check CXR    I discussed the assessment and treatment plan with the patient. The patient was provided an opportunity to ask questions, and all were answered. The patient agreed with the plan and demonstrated an understanding of the instructions.   The patient was advised to call back or seek an in-person evaluation if the symptoms worsen or if the condition fails to improve as anticipated.  The above assessment and management plan was discussed with the patient. The patient verbalized understanding of and has agreed to the management plan.   Medication Adjustments/Labs and Tests Ordered: Current medicines are reviewed at length with the patient today.  Concerns regarding medicines are  outlined above.   Tests Ordered: No orders of the defined types were placed in this encounter.   Medication Changes: No orders of the defined types were placed in this encounter.    Note: This dictation was prepared with Dragon dictation along with smaller phrase technology. Similar sounding words can be transcribed inadequately or may not be corrected upon review. Any transcriptional errors that result from this process are unintentional.      Disposition:  Follow up  Signed, Suzzane MARLA Blanch, MD  08/24/2024 11:51 AM     [place]  Primary Care [facility]

## 2024-08-25 DIAGNOSIS — J189 Pneumonia, unspecified organism: Secondary | ICD-10-CM | POA: Diagnosis not present

## 2024-08-25 DIAGNOSIS — E1142 Type 2 diabetes mellitus with diabetic polyneuropathy: Secondary | ICD-10-CM | POA: Diagnosis not present

## 2024-08-25 DIAGNOSIS — J440 Chronic obstructive pulmonary disease with acute lower respiratory infection: Secondary | ICD-10-CM | POA: Diagnosis not present

## 2024-08-25 DIAGNOSIS — I714 Abdominal aortic aneurysm, without rupture, unspecified: Secondary | ICD-10-CM | POA: Diagnosis not present

## 2024-08-25 DIAGNOSIS — I4891 Unspecified atrial fibrillation: Secondary | ICD-10-CM | POA: Diagnosis not present

## 2024-08-25 DIAGNOSIS — E1151 Type 2 diabetes mellitus with diabetic peripheral angiopathy without gangrene: Secondary | ICD-10-CM | POA: Diagnosis not present

## 2024-08-26 ENCOUNTER — Encounter: Payer: Self-pay | Admitting: Internal Medicine

## 2024-08-30 ENCOUNTER — Other Ambulatory Visit: Payer: Self-pay

## 2024-08-30 ENCOUNTER — Ambulatory Visit: Payer: Self-pay | Admitting: Pharmacist

## 2024-08-30 DIAGNOSIS — Z5181 Encounter for therapeutic drug level monitoring: Secondary | ICD-10-CM

## 2024-08-30 DIAGNOSIS — I480 Paroxysmal atrial fibrillation: Secondary | ICD-10-CM

## 2024-08-30 LAB — POCT INR: INR: 2.5 (ref 2.0–3.0)

## 2024-08-30 MED ORDER — METOPROLOL SUCCINATE ER 100 MG PO TB24: 100.0000 mg | ORAL_TABLET | Freq: Every day | ORAL | 1 refills | Status: AC

## 2024-08-30 MED ORDER — DILTIAZEM HCL ER COATED BEADS 120 MG PO CP24: 120.0000 mg | ORAL_CAPSULE | Freq: Every day | ORAL | 1 refills | Status: AC

## 2024-08-30 NOTE — Progress Notes (Signed)
 Description   INR 2.5: Continue 1/2 tablet daily except 1 tablet on Mondays and Fridays Recheck in 1 wk. Orders given to [name] RN [facility]

## 2024-08-30 NOTE — Patient Instructions (Signed)
 Description   INR 2.5: Continue 1/2 tablet daily except 1 tablet on Mondays and Fridays Recheck in 1 wk. Orders given to [name] RN [facility]

## 2024-08-31 ENCOUNTER — Ambulatory Visit (HOSPITAL_COMMUNITY)
Admission: RE | Admit: 2024-08-31 | Discharge: 2024-08-31 | Disposition: A | Discharge status: Home / Self Care | Source: Ambulatory Visit | Attending: Internal Medicine | Admitting: Internal Medicine

## 2024-08-31 DIAGNOSIS — R918 Other nonspecific abnormal finding of lung field: Secondary | ICD-10-CM | POA: Diagnosis not present

## 2024-08-31 DIAGNOSIS — J441 Chronic obstructive pulmonary disease with (acute) exacerbation: Secondary | ICD-10-CM | POA: Diagnosis not present

## 2024-09-02 ENCOUNTER — Other Ambulatory Visit: Payer: Self-pay | Admitting: Internal Medicine

## 2024-09-02 ENCOUNTER — Ambulatory Visit: Payer: Self-pay | Admitting: Internal Medicine

## 2024-09-02 DIAGNOSIS — N401 Enlarged prostate with lower urinary tract symptoms: Secondary | ICD-10-CM

## 2024-09-07 ENCOUNTER — Ambulatory Visit: Payer: Self-pay | Admitting: Pharmacist

## 2024-09-07 DIAGNOSIS — Z5181 Encounter for therapeutic drug level monitoring: Secondary | ICD-10-CM

## 2024-09-07 DIAGNOSIS — I480 Paroxysmal atrial fibrillation: Secondary | ICD-10-CM

## 2024-09-07 LAB — POCT INR: INR: 3 (ref 2.0–3.0)

## 2024-09-07 NOTE — Patient Instructions (Signed)
 Description   INR 3.0: Continue 1/2 tablet daily except 1 tablet on Mondays and Fridays Recheck in 1 wk. Orders given to [name] RN [facility]

## 2024-09-07 NOTE — Progress Notes (Signed)
 Description   INR 3.0: Continue 1/2 tablet daily except 1 tablet on Mondays and Fridays Recheck in 1 wk. Orders given to [facility] RN [facility]

## 2024-09-13 ENCOUNTER — Telehealth: Payer: Self-pay | Admitting: Cardiology

## 2024-09-13 ENCOUNTER — Ambulatory Visit (INDEPENDENT_AMBULATORY_CARE_PROVIDER_SITE_OTHER): Admitting: *Deleted

## 2024-09-13 DIAGNOSIS — Z5181 Encounter for therapeutic drug level monitoring: Secondary | ICD-10-CM

## 2024-09-13 DIAGNOSIS — I4891 Unspecified atrial fibrillation: Secondary | ICD-10-CM

## 2024-09-13 LAB — POCT INR: INR: 3.6 — AB (ref 2.0–3.0)

## 2024-09-13 NOTE — Telephone Encounter (Signed)
 Caller [place]) reporting patient's INR - 3.6 today and wants to know patient's dosage for the rest of this week.

## 2024-09-14 ENCOUNTER — Ambulatory Visit

## 2024-09-14 NOTE — Progress Notes (Signed)
 INR 3.6  Please see anticoagulation encounter

## 2024-09-14 NOTE — Patient Instructions (Signed)
 Hold warfarin tonight then resume 1/2 tablet daily except 1 tablet on Mondays and Fridays Recheck in 1 wk. Orders given to [facility] RN [facility]

## 2024-09-14 NOTE — Telephone Encounter (Signed)
 Called and spoke with Elgin.  See coumadin  note.

## 2024-09-18 ENCOUNTER — Emergency Department (HOSPITAL_COMMUNITY)

## 2024-09-18 ENCOUNTER — Encounter (HOSPITAL_COMMUNITY): Payer: Self-pay

## 2024-09-18 ENCOUNTER — Other Ambulatory Visit: Payer: Self-pay

## 2024-09-18 ENCOUNTER — Encounter: Payer: Self-pay | Admitting: Oncology

## 2024-09-18 ENCOUNTER — Inpatient Hospital Stay (HOSPITAL_COMMUNITY)
Admission: EM | Admit: 2024-09-18 | Discharge: 2024-09-28 | DRG: 205 | Disposition: A | Discharge status: Hospice | Attending: Internal Medicine | Admitting: Internal Medicine

## 2024-09-18 DIAGNOSIS — N400 Enlarged prostate without lower urinary tract symptoms: Secondary | ICD-10-CM | POA: Diagnosis present

## 2024-09-18 DIAGNOSIS — Z7901 Long term (current) use of anticoagulants: Secondary | ICD-10-CM | POA: Diagnosis not present

## 2024-09-18 DIAGNOSIS — J439 Emphysema, unspecified: Secondary | ICD-10-CM | POA: Diagnosis present

## 2024-09-18 DIAGNOSIS — Z7189 Other specified counseling: Secondary | ICD-10-CM | POA: Diagnosis not present

## 2024-09-18 DIAGNOSIS — Z8673 Personal history of transient ischemic attack (TIA), and cerebral infarction without residual deficits: Secondary | ICD-10-CM

## 2024-09-18 DIAGNOSIS — Z1152 Encounter for screening for COVID-19: Secondary | ICD-10-CM | POA: Diagnosis not present

## 2024-09-18 DIAGNOSIS — F32A Depression, unspecified: Secondary | ICD-10-CM | POA: Diagnosis present

## 2024-09-18 DIAGNOSIS — Z825 Family history of asthma and other chronic lower respiratory diseases: Secondary | ICD-10-CM

## 2024-09-18 DIAGNOSIS — Z9981 Dependence on supplemental oxygen: Secondary | ICD-10-CM

## 2024-09-18 DIAGNOSIS — C3490 Malignant neoplasm of unspecified part of unspecified bronchus or lung: Secondary | ICD-10-CM | POA: Diagnosis not present

## 2024-09-18 DIAGNOSIS — Z9221 Personal history of antineoplastic chemotherapy: Secondary | ICD-10-CM

## 2024-09-18 DIAGNOSIS — Z515 Encounter for palliative care: Secondary | ICD-10-CM | POA: Diagnosis not present

## 2024-09-18 DIAGNOSIS — J189 Pneumonia, unspecified organism: Secondary | ICD-10-CM | POA: Diagnosis present

## 2024-09-18 DIAGNOSIS — J188 Other pneumonia, unspecified organism: Principal | ICD-10-CM

## 2024-09-18 DIAGNOSIS — E1159 Type 2 diabetes mellitus with other circulatory complications: Secondary | ICD-10-CM

## 2024-09-18 DIAGNOSIS — R0602 Shortness of breath: Secondary | ICD-10-CM | POA: Diagnosis present

## 2024-09-18 DIAGNOSIS — J441 Chronic obstructive pulmonary disease with (acute) exacerbation: Secondary | ICD-10-CM | POA: Diagnosis present

## 2024-09-18 DIAGNOSIS — J440 Chronic obstructive pulmonary disease with acute lower respiratory infection: Secondary | ICD-10-CM | POA: Diagnosis present

## 2024-09-18 DIAGNOSIS — Z87891 Personal history of nicotine dependence: Secondary | ICD-10-CM

## 2024-09-18 DIAGNOSIS — Z923 Personal history of irradiation: Secondary | ICD-10-CM

## 2024-09-18 DIAGNOSIS — J704 Drug-induced interstitial lung disorders, unspecified: Secondary | ICD-10-CM | POA: Diagnosis present

## 2024-09-18 DIAGNOSIS — C3491 Malignant neoplasm of unspecified part of right bronchus or lung: Secondary | ICD-10-CM | POA: Diagnosis present

## 2024-09-18 DIAGNOSIS — I5032 Chronic diastolic (congestive) heart failure: Secondary | ICD-10-CM | POA: Diagnosis present

## 2024-09-18 DIAGNOSIS — R7989 Other specified abnormal findings of blood chemistry: Secondary | ICD-10-CM | POA: Diagnosis not present

## 2024-09-18 DIAGNOSIS — R0902 Hypoxemia: Secondary | ICD-10-CM | POA: Diagnosis not present

## 2024-09-18 DIAGNOSIS — F419 Anxiety disorder, unspecified: Secondary | ICD-10-CM | POA: Diagnosis present

## 2024-09-18 DIAGNOSIS — I480 Paroxysmal atrial fibrillation: Secondary | ICD-10-CM | POA: Diagnosis present

## 2024-09-18 DIAGNOSIS — Z8261 Family history of arthritis: Secondary | ICD-10-CM

## 2024-09-18 DIAGNOSIS — K219 Gastro-esophageal reflux disease without esophagitis: Secondary | ICD-10-CM | POA: Diagnosis present

## 2024-09-18 DIAGNOSIS — I4891 Unspecified atrial fibrillation: Secondary | ICD-10-CM | POA: Diagnosis present

## 2024-09-18 DIAGNOSIS — I110 Hypertensive heart disease with heart failure: Secondary | ICD-10-CM | POA: Diagnosis present

## 2024-09-18 DIAGNOSIS — I714 Abdominal aortic aneurysm, without rupture, unspecified: Secondary | ICD-10-CM | POA: Diagnosis present

## 2024-09-18 DIAGNOSIS — I10 Essential (primary) hypertension: Secondary | ICD-10-CM | POA: Diagnosis present

## 2024-09-18 DIAGNOSIS — Z87442 Personal history of urinary calculi: Secondary | ICD-10-CM

## 2024-09-18 DIAGNOSIS — Z85118 Personal history of other malignant neoplasm of bronchus and lung: Secondary | ICD-10-CM

## 2024-09-18 DIAGNOSIS — Z79899 Other long term (current) drug therapy: Secondary | ICD-10-CM | POA: Diagnosis not present

## 2024-09-18 DIAGNOSIS — J984 Other disorders of lung: Secondary | ICD-10-CM | POA: Diagnosis present

## 2024-09-18 DIAGNOSIS — E119 Type 2 diabetes mellitus without complications: Secondary | ICD-10-CM

## 2024-09-18 DIAGNOSIS — Z789 Other specified health status: Secondary | ICD-10-CM | POA: Diagnosis not present

## 2024-09-18 DIAGNOSIS — M479 Spondylosis, unspecified: Secondary | ICD-10-CM | POA: Diagnosis present

## 2024-09-18 DIAGNOSIS — E782 Mixed hyperlipidemia: Secondary | ICD-10-CM | POA: Diagnosis present

## 2024-09-18 DIAGNOSIS — J9621 Acute and chronic respiratory failure with hypoxia: Principal | ICD-10-CM | POA: Diagnosis present

## 2024-09-18 DIAGNOSIS — I7143 Infrarenal abdominal aortic aneurysm, without rupture: Secondary | ICD-10-CM | POA: Diagnosis not present

## 2024-09-18 DIAGNOSIS — E1151 Type 2 diabetes mellitus with diabetic peripheral angiopathy without gangrene: Secondary | ICD-10-CM | POA: Diagnosis present

## 2024-09-18 DIAGNOSIS — Z902 Acquired absence of lung [part of]: Secondary | ICD-10-CM

## 2024-09-18 DIAGNOSIS — M170 Bilateral primary osteoarthritis of knee: Secondary | ICD-10-CM | POA: Diagnosis present

## 2024-09-18 DIAGNOSIS — Z66 Do not resuscitate: Secondary | ICD-10-CM | POA: Diagnosis present

## 2024-09-18 DIAGNOSIS — Z7951 Long term (current) use of inhaled steroids: Secondary | ICD-10-CM

## 2024-09-18 DIAGNOSIS — L299 Pruritus, unspecified: Secondary | ICD-10-CM | POA: Diagnosis not present

## 2024-09-18 DIAGNOSIS — I639 Cerebral infarction, unspecified: Secondary | ICD-10-CM | POA: Diagnosis not present

## 2024-09-18 DIAGNOSIS — G4730 Sleep apnea, unspecified: Secondary | ICD-10-CM | POA: Diagnosis present

## 2024-09-18 LAB — CBC WITH DIFFERENTIAL/PLATELET
Abs Immature Granulocytes: 0.04 K/uL (ref 0.00–0.07)
Basophils Absolute: 0 K/uL (ref 0.0–0.1)
Basophils Relative: 0 %
Eosinophils Absolute: 0.3 K/uL (ref 0.0–0.5)
Eosinophils Relative: 4 %
HCT: 36.4 % — ABNORMAL LOW (ref 39.0–52.0)
Hemoglobin: 10.4 g/dL — ABNORMAL LOW (ref 13.0–17.0)
Immature Granulocytes: 1 %
Lymphocytes Relative: 4 %
Lymphs Abs: 0.3 K/uL — ABNORMAL LOW (ref 0.7–4.0)
MCH: 23.4 pg — ABNORMAL LOW (ref 26.0–34.0)
MCHC: 28.6 g/dL — ABNORMAL LOW (ref 30.0–36.0)
MCV: 81.8 fL (ref 80.0–100.0)
Monocytes Absolute: 1.1 K/uL — ABNORMAL HIGH (ref 0.1–1.0)
Monocytes Relative: 14 %
Neutro Abs: 6.5 K/uL (ref 1.7–7.7)
Neutrophils Relative %: 77 %
Platelets: 195 K/uL (ref 150–400)
RBC: 4.45 MIL/uL (ref 4.22–5.81)
RDW: 16.9 % — ABNORMAL HIGH (ref 11.5–15.5)
WBC: 8.4 K/uL (ref 4.0–10.5)
nRBC: 0 % (ref 0.0–0.2)

## 2024-09-18 LAB — URINALYSIS, W/ REFLEX TO CULTURE (INFECTION SUSPECTED)
Bilirubin Urine: NEGATIVE
Glucose, UA: NEGATIVE mg/dL
Hgb urine dipstick: NEGATIVE
Ketones, ur: NEGATIVE mg/dL
Leukocytes,Ua: NEGATIVE
Nitrite: NEGATIVE
Protein, ur: NEGATIVE mg/dL
Specific Gravity, Urine: 1.018 (ref 1.005–1.030)
pH: 7 (ref 5.0–8.0)

## 2024-09-18 LAB — GLUCOSE, CAPILLARY: Glucose-Capillary: 250 mg/dL — ABNORMAL HIGH (ref 70–99)

## 2024-09-18 LAB — PROTIME-INR
INR: 2.6 — ABNORMAL HIGH (ref 0.8–1.2)
Prothrombin Time: 29 s — ABNORMAL HIGH (ref 11.4–15.2)

## 2024-09-18 LAB — PRO BRAIN NATRIURETIC PEPTIDE: Pro Brain Natriuretic Peptide: 2453 pg/mL — ABNORMAL HIGH

## 2024-09-18 LAB — COMPREHENSIVE METABOLIC PANEL WITH GFR
ALT: 16 U/L (ref 0–44)
AST: 20 U/L (ref 15–41)
Albumin: 4 g/dL (ref 3.5–5.0)
Alkaline Phosphatase: 48 U/L (ref 38–126)
Anion gap: 2 — ABNORMAL LOW (ref 5–15)
BUN: 16 mg/dL (ref 8–23)
CO2: 38 mmol/L — ABNORMAL HIGH (ref 22–32)
Calcium: 9.4 mg/dL (ref 8.9–10.3)
Chloride: 103 mmol/L (ref 98–111)
Creatinine, Ser: 0.89 mg/dL (ref 0.61–1.24)
GFR, Estimated: >60 mL/min
Glucose, Bld: 104 mg/dL — ABNORMAL HIGH (ref 70–99)
Potassium: 4.3 mmol/L (ref 3.5–5.1)
Sodium: 144 mmol/L (ref 135–145)
Total Bilirubin: 0.8 mg/dL (ref 0.0–1.2)
Total Protein: 6.7 g/dL (ref 6.5–8.1)

## 2024-09-18 LAB — RESP PANEL BY RT-PCR (RSV, FLU A&B, COVID)  RVPGX2
Influenza A by PCR: NEGATIVE
Influenza B by PCR: NEGATIVE
Resp Syncytial Virus by PCR: NEGATIVE
SARS Coronavirus 2 by RT PCR: NEGATIVE

## 2024-09-18 LAB — TSH: TSH: 0.572 u[IU]/mL (ref 0.350–4.500)

## 2024-09-18 LAB — PHOSPHORUS: Phosphorus: 2.5 mg/dL (ref 2.5–4.6)

## 2024-09-18 LAB — MAGNESIUM: Magnesium: 1.9 mg/dL (ref 1.7–2.4)

## 2024-09-18 LAB — LACTIC ACID, PLASMA: Lactic Acid, Venous: 1.1 mmol/L (ref 0.5–1.9)

## 2024-09-18 MED ORDER — GUAIFENESIN-DM 100-10 MG/5ML PO SYRP
5.0000 mL | ORAL_SOLUTION | ORAL | Status: DC | PRN
  Administered 2024-09-18 – 2024-09-25 (×5): 5 mL via ORAL
  Filled 2024-09-18 (×5): qty 5

## 2024-09-18 MED ORDER — WARFARIN SODIUM 2.5 MG PO TABS
2.5000 mg | ORAL_TABLET | Freq: Once | ORAL | Status: AC
  Administered 2024-09-18: 2.5 mg via ORAL
  Filled 2024-09-18: qty 1

## 2024-09-18 MED ORDER — IPRATROPIUM-ALBUTEROL 0.5-2.5 (3) MG/3ML IN SOLN
3.0000 mL | Freq: Once | RESPIRATORY_TRACT | Status: AC
  Administered 2024-09-18: 3 mL via RESPIRATORY_TRACT
  Filled 2024-09-18: qty 3

## 2024-09-18 MED ORDER — ONDANSETRON HCL 4 MG PO TABS: 4.0000 mg | ORAL_TABLET | Freq: Four times a day (QID) | ORAL | Status: DC | PRN

## 2024-09-18 MED ORDER — DM-GUAIFENESIN ER 30-600 MG PO TB12
1.0000 | ORAL_TABLET | Freq: Two times a day (BID) | ORAL | Status: DC
  Administered 2024-09-18 – 2024-09-27 (×18): 1 via ORAL
  Filled 2024-09-18 (×17): qty 1

## 2024-09-18 MED ORDER — HYDROXYZINE HCL 25 MG PO TABS
25.0000 mg | ORAL_TABLET | Freq: Three times a day (TID) | ORAL | Status: DC | PRN
  Administered 2024-09-18 – 2024-09-25 (×6): 25 mg via ORAL
  Filled 2024-09-18 (×6): qty 1

## 2024-09-18 MED ORDER — ROSUVASTATIN CALCIUM 20 MG PO TABS
40.0000 mg | ORAL_TABLET | Freq: Every day | ORAL | Status: DC
  Administered 2024-09-19 – 2024-09-28 (×10): 40 mg via ORAL
  Filled 2024-09-18 (×8): qty 2

## 2024-09-18 MED ORDER — AZITHROMYCIN 250 MG PO TABS
250.0000 mg | ORAL_TABLET | Freq: Every day | ORAL | Status: DC
  Administered 2024-09-19 – 2024-09-28 (×10): 250 mg via ORAL
  Filled 2024-09-18 (×8): qty 1

## 2024-09-18 MED ORDER — BUSPIRONE HCL 5 MG PO TABS: 5.0000 mg | ORAL_TABLET | Freq: Three times a day (TID) | ORAL | Status: DC

## 2024-09-18 MED ORDER — ALBUTEROL SULFATE (2.5 MG/3ML) 0.083% IN NEBU
5.0000 mg | INHALATION_SOLUTION | Freq: Once | RESPIRATORY_TRACT | Status: AC
  Administered 2024-09-18: 5 mg via RESPIRATORY_TRACT
  Filled 2024-09-18: qty 6

## 2024-09-18 MED ORDER — ARFORMOTEROL TARTRATE 15 MCG/2ML IN NEBU
15.0000 ug | INHALATION_SOLUTION | Freq: Two times a day (BID) | RESPIRATORY_TRACT | Status: DC
  Administered 2024-09-18 – 2024-09-28 (×20): 15 ug via RESPIRATORY_TRACT
  Filled 2024-09-18 (×18): qty 2

## 2024-09-18 MED ORDER — FUROSEMIDE 10 MG/ML IJ SOLN
40.0000 mg | Freq: Once | INTRAMUSCULAR | Status: AC
  Administered 2024-09-18: 40 mg via INTRAVENOUS
  Filled 2024-09-18: qty 4

## 2024-09-18 MED ORDER — TAMSULOSIN HCL 0.4 MG PO CAPS
0.8000 mg | ORAL_CAPSULE | Freq: Every day | ORAL | Status: DC
  Administered 2024-09-19 – 2024-09-28 (×10): 0.8 mg via ORAL
  Filled 2024-09-18 (×8): qty 2

## 2024-09-18 MED ORDER — BUSPIRONE HCL 5 MG PO TABS
5.0000 mg | ORAL_TABLET | Freq: Three times a day (TID) | ORAL | Status: DC
  Administered 2024-09-18 – 2024-09-28 (×29): 5 mg via ORAL
  Filled 2024-09-18 (×26): qty 1

## 2024-09-18 MED ORDER — BUDESONIDE 0.5 MG/2ML IN SUSP
0.5000 mg | Freq: Two times a day (BID) | RESPIRATORY_TRACT | Status: DC
  Administered 2024-09-18 – 2024-09-28 (×20): 0.5 mg via RESPIRATORY_TRACT
  Filled 2024-09-18 (×18): qty 2

## 2024-09-18 MED ORDER — IPRATROPIUM-ALBUTEROL 0.5-2.5 (3) MG/3ML IN SOLN
3.0000 mL | Freq: Three times a day (TID) | RESPIRATORY_TRACT | Status: DC
  Administered 2024-09-18: 3 mL via RESPIRATORY_TRACT
  Filled 2024-09-18: qty 3

## 2024-09-18 MED ORDER — METHYLPREDNISOLONE SODIUM SUCC 125 MG IJ SOLR
125.0000 mg | Freq: Once | INTRAMUSCULAR | Status: AC
  Administered 2024-09-18: 125 mg via INTRAVENOUS
  Filled 2024-09-18: qty 2

## 2024-09-18 MED ORDER — METOPROLOL SUCCINATE ER 50 MG PO TB24
100.0000 mg | ORAL_TABLET | Freq: Every day | ORAL | Status: DC
  Administered 2024-09-19 – 2024-09-28 (×10): 100 mg via ORAL
  Filled 2024-09-18 (×8): qty 2

## 2024-09-18 MED ORDER — ACETAMINOPHEN 325 MG PO TABS
650.0000 mg | ORAL_TABLET | Freq: Four times a day (QID) | ORAL | Status: DC | PRN
  Administered 2024-09-21 – 2024-09-27 (×6): 650 mg via ORAL
  Filled 2024-09-18 (×3): qty 2

## 2024-09-18 MED ORDER — PANTOPRAZOLE SODIUM 40 MG PO TBEC
40.0000 mg | DELAYED_RELEASE_TABLET | Freq: Two times a day (BID) | ORAL | Status: DC
  Administered 2024-09-18 – 2024-09-28 (×21): 40 mg via ORAL
  Filled 2024-09-18 (×18): qty 1

## 2024-09-18 MED ORDER — WARFARIN - PHARMACIST DOSING INPATIENT: Freq: Every day | Status: DC

## 2024-09-18 MED ORDER — ALBUTEROL SULFATE (2.5 MG/3ML) 0.083% IN NEBU
2.5000 mg | INHALATION_SOLUTION | RESPIRATORY_TRACT | Status: DC | PRN
  Administered 2024-09-18 – 2024-09-26 (×13): 2.5 mg via RESPIRATORY_TRACT
  Filled 2024-09-18 (×13): qty 3

## 2024-09-18 MED ORDER — LORATADINE 10 MG PO TABS
10.0000 mg | ORAL_TABLET | Freq: Every day | ORAL | Status: DC
  Administered 2024-09-18 – 2024-09-28 (×11): 10 mg via ORAL
  Filled 2024-09-18 (×9): qty 1

## 2024-09-18 MED ORDER — IPRATROPIUM-ALBUTEROL 0.5-2.5 (3) MG/3ML IN SOLN
3.0000 mL | Freq: Three times a day (TID) | RESPIRATORY_TRACT | Status: DC
  Administered 2024-09-19 – 2024-09-28 (×29): 3 mL via RESPIRATORY_TRACT
  Filled 2024-09-18 (×25): qty 3

## 2024-09-18 MED ORDER — ACETAMINOPHEN 650 MG RE SUPP: 650.0000 mg | Freq: Four times a day (QID) | RECTAL | Status: DC | PRN

## 2024-09-18 MED ORDER — SODIUM CHLORIDE 0.9 % IV SOLN
1.0000 g | INTRAVENOUS | Status: DC
  Administered 2024-09-19 – 2024-09-28 (×10): 1 g via INTRAVENOUS
  Filled 2024-09-18 (×8): qty 10

## 2024-09-18 MED ORDER — ONDANSETRON HCL 4 MG/2ML IJ SOLN: 4.0000 mg | Freq: Four times a day (QID) | INTRAMUSCULAR | Status: DC | PRN

## 2024-09-18 MED ORDER — SODIUM CHLORIDE 0.9 % IV SOLN
1.0000 g | Freq: Once | INTRAVENOUS | Status: AC
  Administered 2024-09-18: 1 g via INTRAVENOUS
  Filled 2024-09-18: qty 10

## 2024-09-18 MED ORDER — CHLORHEXIDINE GLUCONATE CLOTH 2 % EX PADS
6.0000 | MEDICATED_PAD | Freq: Every day | CUTANEOUS | Status: DC
  Administered 2024-09-19 – 2024-09-20 (×2): 6 via TOPICAL

## 2024-09-18 MED ORDER — SODIUM CHLORIDE 0.9 % IV SOLN
500.0000 mg | INTRAVENOUS | Status: DC
  Administered 2024-09-18: 500 mg via INTRAVENOUS
  Filled 2024-09-18: qty 5

## 2024-09-18 MED ORDER — DILTIAZEM HCL ER COATED BEADS 120 MG PO CP24
120.0000 mg | ORAL_CAPSULE | Freq: Every day | ORAL | Status: DC
  Administered 2024-09-19 – 2024-09-28 (×10): 120 mg via ORAL
  Filled 2024-09-18 (×8): qty 1

## 2024-09-18 MED ORDER — METHYLPREDNISOLONE SODIUM SUCC 40 MG IJ SOLR
40.0000 mg | Freq: Two times a day (BID) | INTRAMUSCULAR | Status: DC
  Administered 2024-09-18 – 2024-09-21 (×7): 40 mg via INTRAVENOUS
  Filled 2024-09-18 (×7): qty 1

## 2024-09-18 MED ORDER — ORAL CARE MOUTH RINSE: 15.0000 mL | OROMUCOSAL | Status: DC | PRN

## 2024-09-18 NOTE — Assessment & Plan Note (Signed)
-   Apparently experiencing recurrence at the moment. - Actively followed by oncology service - Continue outpatient follow-up.

## 2024-09-18 NOTE — Assessment & Plan Note (Signed)
 Continue statin

## 2024-09-18 NOTE — Assessment & Plan Note (Signed)
-   Bronchodilator management, steroids, mucolytic's and flutter valve has been started - Continue to wean off oxygen  supplementation down to baseline.

## 2024-09-18 NOTE — Assessment & Plan Note (Signed)
-   Resume home antihypertensive agents.

## 2024-09-18 NOTE — Assessment & Plan Note (Signed)
-   Patient with underlying history of chronic diastolic heart failure - Possible mild exacerbation present - IV Lasix  x 1 will be given - Follow daily weights and strict intake and output - Will follow response and determine the need for further diuresis. - Will place TED hoses.

## 2024-09-18 NOTE — ED Notes (Signed)
 Unable to access port at this time. Patient endorsed pain and unable to aspirate blood return. IV established.

## 2024-09-18 NOTE — Assessment & Plan Note (Signed)
-   Paroxysmal atrial fibrillation - Continue treatment with Cardizem  and metoprolol  - Coumadin  per pharmacy for secondary prevention.

## 2024-09-18 NOTE — ED Triage Notes (Signed)
 Patient presents POV from home c/c low O2 sats since early this morning. Also complains of chest pain.

## 2024-09-18 NOTE — Assessment & Plan Note (Signed)
-   In the setting of COPD exacerbation and bronchiectasis/multifocal pneumonia -Continue IV antibiotics - Steroids and nebulizer management has been started - Mucolytic's and the use of flutter valve initiated - Continue titrating down oxygen  supplementation as tolerated - Patient is a full code and at the moment interested in pursuing ventilatory management and intubation if needed.

## 2024-09-18 NOTE — Progress Notes (Signed)
 PHARMACY - ANTICOAGULATION CONSULT NOTE  Pharmacy Consult for warfarin Indication: atrial fibrillation  Allergies[1]  Patient Measurements: Height: 5' 10 (177.8 cm) Weight: 93.4 kg (206 lb) IBW/kg (Calculated) : 73 HEPARIN  DW (KG): 91.9  Vital Signs: Temp: 98.4 F (36.9 C) (12/20 0900) Temp Source: Oral (12/20 0900) BP: 105/79 (12/20 1245) Pulse Rate: 81 (12/20 1245)  Labs: Recent Labs    09/18/24 0922  HGB 10.4*  HCT 36.4*  PLT 195  LABPROT 29.0*  INR 2.6*  CREATININE 0.89    Estimated Creatinine Clearance: 83.6 mL/min (by C-G formula based on SCr of 0.89 mg/dL).   Medical History: Past Medical History:  Diagnosis Date   A-fib (HCC) 2017   after last lung surgery patient had a history if Afib documented in hospital    AAA (abdominal aortic aneurysm)    4.0 cm 09/17/22 US    Acute appendicitis 07/14/2021   Arthritis    back & legs ( knees)   Cancer (HCC)    lung   Complication of anesthesia    agitated upon waking from anesth.    Dyspnea    Dysrhythmia    A. Fib   Emphysema    Emphysema of lung (HCC)    History of kidney stones    Hyperlipidemia    Hypertension    Oxygen  dependent    2L continuous   Peripheral vascular disease    Aortic Aneurysm   Pneumonia    Pre-diabetes    Sleep apnea    No OSA, but wears 2L [place] O2 continuous including at night   Stroke [facility]) 2022   Right sided weakness, decreased hearing, balance and memory loss   Tobacco abuse     Medications:  (Not in a hospital admission)   Assessment: Pharmacy consulted to dose warfarin in patient with atrial fibrillation.  Patient's home dose listed as 5 mg on Mon + Fri and 2.5 mg ROW.  INR on admission is therapeutic at 2.6.  CBC WNL  Goal of Therapy:  INR 2-3 Monitor platelets by anticoagulation protocol: Yes   Plan:  Warfarin 2.5 mg x 1 dose. Monitor daily INR and s/s of bleeding  Elspeth Sour, PharmD Clinical Pharmacist 09/18/2024 1:02 PM       [1] No Known  Allergies

## 2024-09-18 NOTE — Assessment & Plan Note (Signed)
-  Continue the use of Flomax. ?

## 2024-09-18 NOTE — ED Provider Notes (Signed)
 " [place] EMERGENCY DEPARTMENT AT [facility] Provider Note   CSN: [id number] Arrival date & time: 09/18/24  9167     Patient presents with: No chief complaint on file.   Randall Sullivan is a 73 y.o. male.   Pt is a 73 yo male with pmhx significant for COPD (on 2-3L oxygen  at home), afib (on coumadin ), HTN, arthritis, tobacco abuse, lung cancer, and kidney stones.  Pt was admitted from 11/3-11/6 with multifocal pna.  He saw his PCP on 11/25 for a COPD exac.  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  He is s/p chemoradiation with carboplatin  and paclitaxel  and was on consolidation durvalumab .  Due to the ground glass opacities seen on CT which can be seen with immunotherapy, immunotherapy has been held.  He comes in today because he's had increased SOB with his oxygen  dropping at home as well as a cough.         Prior to Admission medications  Medication Sig Start Date End Date Taking? Authorizing Provider  albuterol  (VENTOLIN  HFA) 108 (90 Base) MCG/ACT inhaler Inhale 2 puffs into the lungs every 4 (four) hours as needed for wheezing or shortness of breath. 06/26/24   Johnson, Clanford L, MD  amoxicillin -clavulanate (AUGMENTIN ) 875-125 MG tablet Take 1 tablet by mouth 2 (two) times daily. 08/24/24   Tobie Suzzane POUR, MD  budesonide -glycopyrrolate -formoterol  (BREZTRI  AEROSPHERE) 160-9-4.8 MCG/ACT AERO inhaler Inhale 2 puffs into the lungs 2 (two) times daily.    [provider]  cetirizine  (ZYRTEC ) 10 MG tablet Take 10 mg by mouth daily.    [provider]  diltiazem  (CARDIZEM  CD) 120 MG 24 hr capsule Take 1 capsule (120 mg total) by mouth daily. 08/30/24   Tobie Suzzane POUR, MD  fenofibrate  (TRICOR ) 145 MG tablet Take 1 tablet (145 mg total) by mouth daily. 06/30/24   Tobie Suzzane POUR, MD  finasteride  (PROSCAR ) 5 MG tablet Take 1 tablet by mouth once daily 09/02/24   Patel, Rutwik K, MD  guaiFENesin -dextromethorphan   (ROBITUSSIN DM) 100-10 MG/5ML syrup Take 10 mLs by mouth every 8 (eight) hours as needed for cough. 06/26/24   Johnson, Clanford L, MD  HYDROcodone  bit-homatropine (HYCODAN) 5-1.5 MG/5ML syrup Take 5 mLs by mouth every 6 (six) hours as needed for cough. 08/05/24   Bryn Bernardino NOVAK, MD  hydrOXYzine  (ATARAX ) 25 MG tablet Take 1 tablet (25 mg total) by mouth every 8 (eight) hours as needed for itching. 05/11/24   Tobie Suzzane POUR, MD  ipratropium-albuterol  (DUONEB) 0.5-2.5 (3) MG/3ML SOLN Take 3 mLs by nebulization every 6 (six) hours as needed. 07/22/24   Tobie Suzzane POUR, MD  magnesium  oxide (MAG-OX) 400 (240 Mg) MG tablet Take 1 tablet (400 mg total) by mouth 2 (two) times daily. 07/12/24   Tobie Suzzane POUR, MD  metoprolol  succinate (TOPROL  XL) 100 MG 24 hr tablet Take 1 tablet (100 mg total) by mouth daily. Take with or immediately following a meal. 08/30/24 09/29/24  Tobie Suzzane POUR, MD  OXYGEN  Inhale 2 L into the lungs continuous. Hx of lung ca, copd, and emphysema    [provider]  Palonosetron  HCl (ALOXI  IV) Inject into the vein.    [provider]  rosuvastatin  (CRESTOR ) 40 MG tablet Take 1 tablet (40 mg total) by mouth daily. 09/03/23   Lynwood Schilling, MD  tamsulosin  (FLOMAX ) 0.4 MG CAPS capsule Take 2 capsules (0.8 mg total) by mouth daily. 10/27/23   Tobie,  Suzzane POUR, MD  warfarin (COUMADIN ) 5 MG tablet Take 0.5-1 tablets (2.5-5 mg total) by mouth See admin instructions. Take 2.5 mg by mouth Tuesday and Friday and take 5 mg on Monday, Wednesday, Thursday, Saturday and Sunday 06/28/24   Vicci Afton CROME, MD    Allergies: Patient has no known allergies.    Review of Systems  Respiratory:  Positive for cough, shortness of breath and wheezing.   All other systems reviewed and are negative.   Updated Vital Signs BP 119/76   Pulse 87   Temp 98.4 F (36.9 C) (Oral)   Resp (!) 22   Ht 5' 10 (1.778 m)   Wt 93.4 kg   SpO2 93%   BMI 29.56 kg/m   Physical Exam Vitals and  nursing note reviewed.  Constitutional:      Appearance: Normal appearance. He is ill-appearing.  HENT:     Head: Normocephalic and atraumatic.     Right Ear: External ear normal.     Left Ear: External ear normal.     Nose: Nose normal.     Mouth/Throat:     Mouth: Mucous membranes are dry.     Pharynx: Oropharynx is clear.  Cardiovascular:     Rate and Rhythm: Normal rate. Rhythm irregular.     Pulses: Normal pulses.     Heart sounds: Normal heart sounds.  Pulmonary:     Breath sounds: Wheezing present.  Abdominal:     General: Abdomen is flat. Bowel sounds are normal.     Palpations: Abdomen is soft.  Musculoskeletal:        General: Normal range of motion.     Cervical back: Normal range of motion and neck supple.  Skin:    General: Skin is warm.     Capillary Refill: Capillary refill takes less than 2 seconds.  Neurological:     General: No focal deficit present.     Mental Status: He is alert and oriented to person, place, and time.  Psychiatric:        Mood and Affect: Mood normal.        Behavior: Behavior normal.     (all labs ordered are listed, but only abnormal results are displayed) Labs Reviewed  COMPREHENSIVE METABOLIC PANEL WITH GFR - Abnormal; Notable for the following components:      Result Value   CO2 38 (*)    Glucose, Bld 104 (*)    Anion gap 2 (*)    All other components within normal limits  CBC WITH DIFFERENTIAL/PLATELET - Abnormal; Notable for the following components:   Hemoglobin 10.4 (*)    HCT 36.4 (*)    MCH 23.4 (*)    MCHC 28.6 (*)    RDW 16.9 (*)    Lymphs Abs 0.3 (*)    Monocytes Absolute 1.1 (*)    All other components within normal limits  PROTIME-INR - Abnormal; Notable for the following components:   Prothrombin Time 29.0 (*)    INR 2.6 (*)    All other components within normal limits  URINALYSIS, W/ REFLEX TO CULTURE (INFECTION SUSPECTED) - Abnormal; Notable for the following components:   APPearance CLOUDY (*)     Bacteria, UA RARE (*)    All other components within normal limits  PRO BRAIN NATRIURETIC PEPTIDE - Abnormal; Notable for the following components:   Pro Brain Natriuretic Peptide 2,453.0 (*)    All other components within normal limits  CULTURE, BLOOD (ROUTINE X 2)  CULTURE, BLOOD (  ROUTINE X 2)  RESP PANEL BY RT-PCR (RSV, FLU A&B, COVID)  RVPGX2  LACTIC ACID, PLASMA    EKG: EKG Interpretation Date/Time:  Saturday September 18 2024 08:58:05 EST Ventricular Rate:  87 PR Interval:    QRS Duration:  112 QT Interval:  377 QTC Calculation: 454 R Axis:   -68  Text Interpretation: Atrial fibrillation Ventricular premature complex Borderline IVCD with LAD Consider anterior infarct No significant change since last tracing Confirmed by Dean Clarity [phone]) on 09/18/2024 10:26:04 AM  Radiology: [facility] Chest Port 1 View if patient is in a treatment room. Result Date: 09/18/2024 EXAM: 1 VIEW(S) XRAY OF THE CHEST 09/18/2024 09:27:00 AM COMPARISON: 08/31/2024 CLINICAL HISTORY: Suspected Sepsis FINDINGS: LINES, TUBES AND DEVICES: Left chest port stable in position. LUNGS AND PLEURA: Surgical clips in left upper lung. New small right pleural effusion. Increased patchy airspace opacities in right lung. Increased interstitial opacities in left lung base. Postsurgical changes in right upper lung. No pneumothorax. HEART AND MEDIASTINUM: No acute abnormality of the cardiac and mediastinal silhouettes. BONES AND SOFT TISSUES: No acute osseous abnormality. IMPRESSION: 1. Increased patchy airspace opacities in the right lung and increased interstitial opacities in the left lung base, suspicious for infection in the setting of sepsis. 2. New small right pleural effusion. Electronically signed by: Waddell Calk MD 09/18/2024 09:31 AM EST RP Workstation: [id number]     Procedures   Medications Ordered in the ED  cefTRIAXone  (ROCEPHIN ) 1 g in sodium chloride  0.9 % 100 mL IVPB (1 g Intravenous New Bag/Given  09/18/24 1146)  azithromycin  (ZITHROMAX ) 500 mg in sodium chloride  0.9 % 250 mL IVPB (500 mg Intravenous New Bag/Given 09/18/24 0951)  ipratropium-albuterol  (DUONEB) 0.5-2.5 (3) MG/3ML nebulizer solution 3 mL (3 mLs Nebulization Given 09/18/24 0951)  methylPREDNISolone  sodium succinate (SOLU-MEDROL ) 125 mg/2 mL injection 125 mg (125 mg Intravenous Given 09/18/24 0947)  albuterol  (PROVENTIL ) (2.5 MG/3ML) 0.083% nebulizer solution 5 mg (5 mg Nebulization Given 09/18/24 1149)                                    Medical Decision Making Amount and/or Complexity of Data Reviewed Labs: ordered. Radiology: ordered.  Risk Prescription drug management. Decision regarding hospitalization.   This patient presents to the ED for concern of sob, this involves an extensive number of treatment options, and is a complaint that carries with it a high risk of complications and morbidity.  The differential diagnosis includes covid/flu/rsv, pna, copd   Co morbidities that complicate the patient evaluation  COPD (on 2-3L oxygen  at home), afib (on coumadin ), HTN, arthritis, tobacco abuse, lung cancer, and kidney stones   Additional history obtained:  Additional history obtained from epic chart review External records from outside source obtained and reviewed including family   Lab Tests:  I Ordered, and personally interpreted labs.  The pertinent results include:  cbc with hgb low at 10.4 (stable); inr 2.6; covid/flu/rsv neg; bnp elevated at 2453; lactic nl; cmp nl   Imaging Studies ordered:  I ordered imaging studies including cxr  I independently visualized and interpreted imaging which showed  Increased patchy airspace opacities in the right lung and increased  interstitial opacities in the left lung base, suspicious for infection in the  setting of sepsis.  2. New small right pleural effusion.   I agree with the radiologist interpretation   Cardiac Monitoring:  The patient was  maintained on a cardiac monitor.  I personally viewed and interpreted the cardiac monitored which showed an underlying rhythm of: af   Medicines ordered and prescription drug management:  I ordered medication including rocephin /zithromax /duoneb/solumedrol  for sx  Reevaluation of the patient after these medicines showed that the patient improved I have reviewed the patients home medicines and have made adjustments as needed   Test Considered:  ct   Critical Interventions:  Oxygen /abx   Consultations Obtained:  I requested consultation with the hospitalist (Dr. Ricky),  and discussed lab and imaging findings as well as pertinent plan - he will admit   Problem List / ED Course:  Multifocal pna:  pt started on rocephin /zithromax .   COPD exacerbation:  pt given nebs and steroids.   Hypoxia:  pt's O2 sat dropped to 84% just with sitting up in bed.  RR went up.  Oxygen  increased to 4L and bipap ordered to see if that would help.   Reevaluation:  After the interventions noted above, I reevaluated the patient and found that they have :improved   Social Determinants of Health:  Lives at home   Dispostion:  After consideration of the diagnostic results and the patients response to treatment, I feel that the patent would benefit from admission.    CRITICAL CARE Performed by: Mliss Boyers   Total critical care time: 30 minutes  Critical care time was exclusive of separately billable procedures and treating other patients.  Critical care was necessary to treat or prevent imminent or life-threatening deterioration.  Critical care was time spent personally by me on the following activities: development of treatment plan with patient and/or surrogate as well as nursing, discussions with consultants, evaluation of patient's response to treatment, examination of patient, obtaining history from patient or surrogate, ordering and performing treatments and interventions,  ordering and review of laboratory studies, ordering and review of radiographic studies, pulse oximetry and re-evaluation of patient's condition.      Final diagnoses:  Multifocal pneumonia  COPD exacerbation [facility])  Hypoxia    ED Discharge Orders     None          Boyers Mliss, MD 09/18/24 1203  "

## 2024-09-18 NOTE — Progress Notes (Signed)
 RT placed Randall Sullivan in BiPAP Ps 10/5.  He wore for about 5 minutes and was unable to tolerate any further.  RN notified and medication requested for anxiety.  RT will try again later if BiPAP is still needed.

## 2024-09-18 NOTE — Assessment & Plan Note (Signed)
 Patient will continue tums Ultra

## 2024-09-18 NOTE — Assessment & Plan Note (Signed)
-   No history of rupture - Continue outpatient monitoring/follow-up.

## 2024-09-18 NOTE — Assessment & Plan Note (Signed)
-   Patient has been started on BuSpar  - No suicidal ideation or hallucination. -As needed Atarax  available to be used as needed milling for itching but also for anxiety.

## 2024-09-18 NOTE — H&P (Signed)
 " History and Physical    Patient: Randall Sullivan FMW:[id number] DOB: 02/05/1951 DOA: 09/18/2024 DOS: the patient was seen and examined on 09/18/2024 PCP: Tobie Suzzane POUR, MD   Patient coming from: Home  Chief Complaint: No chief complaint on file.  HPI: Randall Sullivan is a 73 y.o. male with medical history significant of non-small cell lung cancer, COPD, chronic respiratory failure on 2-3 L supplementation, paroxysmal atrial fibrillation (on Coumadin ), AAA, hypertension, hyperlipidemia, prediabetes, BPH, and prior history of CVA; who presented to the hospital secondary to shortness of breath and worsening mildly productive coughing spells.  Patient reports that for the last 48 hours at home he has noticed increased shortness of breath and hypoxia despite high home oxygen  supplementation; nebulizer treatments have failed to improve his symptoms.  Patient expressed no fever, no chills, no sick contacts, no hemoptysis, no nausea, no vomiting, no chest pain, no hematuria/dysuria or any focal weaknesses.  Workup in the ED demonstrating the need of 6 L supplementation to maintain saturation above 90-92%; chest x-ray with multifocal pneumonia.  Patient respiratory panel by PCR negative for COVID, influenza and RSV.  Cultures taken, IV antibiotics has been started, nebulizer management and steroids provided.  TRH consulted to place patient in the hospital for further evaluation and management.   Review of Systems: As mentioned in the history of present illness. All other systems reviewed and are negative. Past Medical History:  Diagnosis Date   A-fib [facility]) 2017   after last lung surgery patient had a history if Afib documented in hospital    AAA (abdominal aortic aneurysm)    4.0 cm 09/17/22 US    Acute appendicitis 07/14/2021   Arthritis    back & legs ( knees)   Cancer (HCC)    lung   Complication of anesthesia    agitated upon waking from anesth.    Dyspnea    Dysrhythmia    A. Fib    Emphysema    Emphysema of lung (HCC)    History of kidney stones    Hyperlipidemia    Hypertension    Oxygen  dependent    2L continuous   Peripheral vascular disease    Aortic Aneurysm   Pneumonia    Pre-diabetes    Sleep apnea    No OSA, but wears 2L [place] O2 continuous including at night   Stroke [facility]) 2022   Right sided weakness, decreased hearing, balance and memory loss   Tobacco abuse    Past Surgical History:  Procedure Laterality Date   CARDIOVERSION N/A 06/25/2024   Procedure: CARDIOVERSION;  Surgeon: Alvan Dorn FALCON, MD;  Location: [facility];  Service: Endoscopy;  Laterality: N/A;   HERNIA REPAIR Bilateral 1999   umbilical   IR IMAGING GUIDED PORT INSERTION  12/09/2023   LAPAROSCOPIC APPENDECTOMY N/A 07/15/2021   Procedure: APPENDECTOMY LAPAROSCOPIC;  Surgeon: Kallie Manuelita BROCKS, MD;  Location: [facility];  Service: General;  Laterality: N/A;   VIDEO ASSISTED THORACOSCOPY Right 12/21/2018   Procedure: VIDEO ASSISTED THORACOSCOPY AND RESECTION OF BLEBS RIGHT LOWER LOBE;  Surgeon: Kerrin Elspeth BROCKS, MD;  Location: [facility];  Service: Thoracic;  Laterality: Right;   VIDEO ASSISTED THORACOSCOPY (VATS)/ LOBECTOMY Left 08/01/2016   Procedure: VIDEO ASSISTED THORACOSCOPY (VATS)/ LEFT UPPER LOBECTOMY with lymph node sampling and onQ placement;  Surgeon: Elspeth BROCKS Kerrin, MD;  Location: [facility] OR;  Service: Thoracic;  Laterality: Left;   VIDEO ASSISTED THORACOSCOPY (VATS)/WEDGE RESECTION Right 12/09/2018   Procedure: VIDEO ASSISTED THORACOSCOPY (VATS)/WEDGE RESECTION;  Surgeon:  Kerrin Elspeth BROCKS, MD;  Location: [facility] OR;  Service: Thoracic;  Laterality: Right;   VIDEO BRONCHOSCOPY Bilateral 12/21/2018   Procedure: VIDEO BRONCHOSCOPY;  Surgeon: Kerrin Elspeth BROCKS, MD;  Location: [facility] OR;  Service: Thoracic;  Laterality: Bilateral;   VIDEO BRONCHOSCOPY WITH ENDOBRONCHIAL NAVIGATION N/A 07/03/2016   Procedure: VIDEO BRONCHOSCOPY WITH ENDOBRONCHIAL NAVIGATION;  Surgeon: Elspeth BROCKS Kerrin, MD;  Location: [facility];  Service: Thoracic;  Laterality: N/A;   VIDEO BRONCHOSCOPY WITH ENDOBRONCHIAL NAVIGATION N/A 10/24/2023   Procedure: VIDEO BRONCHOSCOPY WITH ENDOBRONCHIAL NAVIGATION;  Surgeon: Kerrin Elspeth BROCKS, MD;  Location: [facility];  Service: Thoracic;  Laterality: N/A;   VIDEO BRONCHOSCOPY WITH ENDOBRONCHIAL ULTRASOUND N/A 10/24/2023   Procedure: VIDEO BRONCHOSCOPY WITH ENDOBRONCHIAL ULTRASOUND;  Surgeon: Kerrin Elspeth BROCKS, MD;  Location: [facility];  Service: Thoracic;  Laterality: N/A;   VIDEO BRONCHOSCOPY WITH INSERTION OF INTERBRONCHIAL VALVE (IBV) N/A 12/11/2018   Procedure: VIDEO BRONCHOSCOPY WITH INSERTION OF INTERBRONCHIAL VALVE (IBV);  Surgeon: Kerrin Elspeth BROCKS, MD;  Location: [facility] OR;  Service: Thoracic;  Laterality: N/A;   VIDEO BRONCHOSCOPY WITH INSERTION OF INTERBRONCHIAL VALVE (IBV) N/A 02/10/2019   Procedure: VIDEO BRONCHOSCOPY WITH REMOVAL OF INTERBRONCHIAL VALVE (IBV);  Surgeon: Kerrin Elspeth BROCKS, MD;  Location: [facility] OR;  Service: Thoracic;  Laterality: N/A;   Social History:  reports that he quit smoking about 8 years ago. His smoking use included cigarettes. He started smoking about 48 years ago. He has a 40 pack-year smoking history. He has never used smokeless tobacco. He reports that he does not currently use alcohol. He reports that he does not use drugs.  Allergies[1]  Family History  Problem Relation Age of Onset   Cancer Mother    Cancer Sister    Aneurysm Sister    Kidney disease Paternal Aunt    Asthma Brother    Cancer - Lung Brother    Cancer Brother    Arthritis Brother     Prior to Admission medications  Medication Sig Start Date End Date Taking? Authorizing Provider  albuterol  (VENTOLIN  HFA) 108 (90 Base) MCG/ACT inhaler Inhale 2 puffs into the lungs every 4 (four) hours as needed for wheezing or shortness of breath. 06/26/24   Vicci Afton CROME, MD  amoxicillin -clavulanate (AUGMENTIN ) 875-125 MG tablet Take 1 tablet by mouth 2  (two) times daily. 08/24/24   Tobie Suzzane POUR, MD  budesonide -glycopyrrolate -formoterol  (BREZTRI  AEROSPHERE) 160-9-4.8 MCG/ACT AERO inhaler Inhale 2 puffs into the lungs 2 (two) times daily.    [provider]  cetirizine  (ZYRTEC ) 10 MG tablet Take 10 mg by mouth daily.    [provider]  diltiazem  (CARDIZEM  CD) 120 MG 24 hr capsule Take 1 capsule (120 mg total) by mouth daily. 08/30/24   Tobie Suzzane POUR, MD  fenofibrate  (TRICOR ) 145 MG tablet Take 1 tablet (145 mg total) by mouth daily. 06/30/24   Tobie Suzzane POUR, MD  finasteride  (PROSCAR ) 5 MG tablet Take 1 tablet by mouth once daily 09/02/24   Patel, Rutwik K, MD  guaiFENesin -dextromethorphan  (ROBITUSSIN DM) 100-10 MG/5ML syrup Take 10 mLs by mouth every 8 (eight) hours as needed for cough. 06/26/24   Johnson, Clanford L, MD  HYDROcodone  bit-homatropine (HYCODAN) 5-1.5 MG/5ML syrup Take 5 mLs by mouth every 6 (six) hours as needed for cough. 08/05/24   Bryn Bernardino NOVAK, MD  hydrOXYzine  (ATARAX ) 25 MG tablet Take 1 tablet (25 mg total) by mouth every 8 (eight) hours as needed for itching. 05/11/24   Tobie Suzzane POUR, MD  ipratropium-albuterol  (DUONEB) 0.5-2.5 (  3) MG/3ML SOLN Take 3 mLs by nebulization every 6 (six) hours as needed. 07/22/24   Tobie Suzzane POUR, MD  magnesium  oxide (MAG-OX) 400 (240 Mg) MG tablet Take 1 tablet (400 mg total) by mouth 2 (two) times daily. 07/12/24   Tobie Suzzane POUR, MD  metoprolol  succinate (TOPROL  XL) 100 MG 24 hr tablet Take 1 tablet (100 mg total) by mouth daily. Take with or immediately following a meal. 08/30/24 09/29/24  Tobie Suzzane POUR, MD  OXYGEN  Inhale 2 L into the lungs continuous. Hx of lung ca, copd, and emphysema    [provider]  Palonosetron  HCl (ALOXI  IV) Inject into the vein.    [provider]  rosuvastatin  (CRESTOR ) 40 MG tablet Take 1 tablet (40 mg total) by mouth daily. 09/03/23   Lynwood Schilling, MD  tamsulosin  (FLOMAX ) 0.4 MG CAPS capsule Take 2 capsules (0.8 mg total)  by mouth daily. 10/27/23   Tobie Suzzane POUR, MD  warfarin (COUMADIN ) 5 MG tablet Take 0.5-1 tablets (2.5-5 mg total) by mouth See admin instructions. Take 2.5 mg by mouth Tuesday and Friday and take 5 mg on Monday, Wednesday, Thursday, Saturday and Sunday 06/28/24   Vicci Afton CROME, MD    Physical Exam: Vitals:   09/18/24 0900 09/18/24 0904  BP:  119/76  Pulse: 87   Resp: (!) 22   Temp: 98.4 F (36.9 C)   TempSrc: Oral   SpO2: 93%   Weight: 93.4 kg   Height: 5' 10 (1.778 m)    General exam: Alert, awake, following commands; demonstrating difficulty speaking in full sentences and complaining of mildly productive coughing spells. Respiratory system: Fair air movement bilaterally; positive tachypnea, positive expiratory wheezing appreciated on exam.  No using accessory muscles.  6 L nasal cannula supplementation in place. Cardiovascular system:RRR.  No rubs, no gallops, no JVD. Gastrointestinal system: Abdomen is nondistended, soft and nontender.  Positive bowel sounds Central nervous system: Moving 4 limbs spontaneously.  No focal neurological deficits. Extremities: No cyanosis or clubbing; pedal edema appreciated bilaterally. Skin: No petechiae; multiple scabs appreciated on his upper extremities and upper back; apparently patient has been experiencing itching and scratching causing breakdown of his skin.  No petechiae. Psychiatry: Judgement and insight appear normal.  Flat affect appreciated on exam.  Patient was anxious in the setting of mild respiratory distress.  Data Reviewed: CBC: WBC 8.4, hemoglobin 10.4 and platelet count 195K Lactic acid: 1.1 Comprehensive metabolic panel: Sodium 144, potassium 4.3, chloride 103, bicarb 38, BUN 16, creatinine 0.89, normal LFTs and GFR >60 Chest x-ray: Demonstrated increased patchy airspace opacity in the right lung and increased interstitial opacity in the left lung base (suspicious for multifocal infection).  Assessment and  Plan: Assessment & Plan Acute on chronic respiratory failure with hypoxia (HCC) - In the setting of COPD exacerbation and bronchiectasis/multifocal pneumonia -Continue IV antibiotics - Steroids and nebulizer management has been started - Mucolytic's and the use of flutter valve initiated - Continue titrating down oxygen  supplementation as tolerated - Patient is a full code and at the moment interested in pursuing ventilatory management and intubation if needed. AAA (abdominal aortic aneurysm) - No history of rupture - Continue outpatient monitoring/follow-up. Atrial fibrillation (HCC) - Paroxysmal atrial fibrillation - Continue treatment with Cardizem  and metoprolol  - Coumadin  per pharmacy for secondary prevention. COPD with acute exacerbation (HCC) - Bronchodilator management, steroids, mucolytic's and flutter valve has been started - Continue to wean off oxygen  supplementation down to baseline. Anxiety and depression - Patient has been  started on BuSpar  - No suicidal ideation or hallucination. -As needed Atarax  available to be used as needed milling for itching but also for anxiety. GERD (gastroesophageal reflux disease) - Continue PPI. Essential hypertension - Resume home antihypertensive agents. Mixed hyperlipidemia - Continue statin. BPH (benign prostatic hyperplasia) - Continue the use of Flomax . Type 2 diabetes mellitus (HCC) - Most recent A1c 5.4 -Following diet control; off any hypoglycemic agents. - Follow CBG fluctuation - Anticipating elevated CBGs with the use of his steroids. Elevated brain natriuretic peptide (BNP) level - Patient with underlying history of chronic diastolic heart failure - Possible mild exacerbation present - IV Lasix  x 1 will be given - Follow daily weights and strict intake and output - Will follow response and determine the need for further diuresis. - Will place TED hoses. Small cell lung cancer (HCC) - Apparently experiencing  recurrence at the moment. - Actively followed by oncology service - Continue outpatient follow-up. CVA (cerebral vascular accident) (HCC) - No complaints of new focal deficit appreciated - Continue risk factor modification and secondary prevention.    Advance Care Planning:   Code Status: Full Code   Consults: None  Family Communication: Wife at bedside.  Severity of Illness: The appropriate patient status for this patient is INPATIENT. Inpatient status is judged to be reasonable and necessary in order to provide the required intensity of service to ensure the patient's safety. The patient's presenting symptoms, physical exam findings, and initial radiographic and laboratory data in the context of their chronic comorbidities is felt to place them at high risk for further clinical deterioration. Furthermore, it is not anticipated that the patient will be medically stable for discharge from the hospital within 2 midnights of admission.   * I certify that at the point of admission it is my clinical judgment that the patient will require inpatient hospital care spanning beyond 2 midnights from the point of admission due to high intensity of service, high risk for further deterioration and high frequency of surveillance required.*  Author: Eric Nunnery, MD 09/18/2024 12:09 PM  For on call review [email or web address].      [1] No Known Allergies  "

## 2024-09-18 NOTE — ED Notes (Signed)
 Attempted to ambulate pt with pulse ox, assisted pt to sit up on side of bed and O2 sat dropped to 84% on 2 L . Pt returned back to bed, MD notified

## 2024-09-18 NOTE — Assessment & Plan Note (Signed)
-   No complaints of new focal deficit appreciated - Continue risk factor modification and secondary prevention.

## 2024-09-18 NOTE — Assessment & Plan Note (Signed)
-   Most recent A1c 5.4 -Following diet control; off any hypoglycemic agents. - Follow CBG fluctuation - Anticipating elevated CBGs with the use of his steroids.

## 2024-09-19 DIAGNOSIS — E1159 Type 2 diabetes mellitus with other circulatory complications: Secondary | ICD-10-CM | POA: Diagnosis not present

## 2024-09-19 DIAGNOSIS — N400 Enlarged prostate without lower urinary tract symptoms: Secondary | ICD-10-CM | POA: Diagnosis not present

## 2024-09-19 DIAGNOSIS — J9621 Acute and chronic respiratory failure with hypoxia: Secondary | ICD-10-CM | POA: Diagnosis not present

## 2024-09-19 DIAGNOSIS — K219 Gastro-esophageal reflux disease without esophagitis: Secondary | ICD-10-CM | POA: Diagnosis not present

## 2024-09-19 DIAGNOSIS — C3490 Malignant neoplasm of unspecified part of unspecified bronchus or lung: Secondary | ICD-10-CM | POA: Diagnosis not present

## 2024-09-19 DIAGNOSIS — R7989 Other specified abnormal findings of blood chemistry: Secondary | ICD-10-CM | POA: Diagnosis not present

## 2024-09-19 DIAGNOSIS — I639 Cerebral infarction, unspecified: Secondary | ICD-10-CM | POA: Diagnosis not present

## 2024-09-19 DIAGNOSIS — J441 Chronic obstructive pulmonary disease with (acute) exacerbation: Secondary | ICD-10-CM | POA: Diagnosis not present

## 2024-09-19 DIAGNOSIS — I7143 Infrarenal abdominal aortic aneurysm, without rupture: Secondary | ICD-10-CM | POA: Diagnosis not present

## 2024-09-19 DIAGNOSIS — I480 Paroxysmal atrial fibrillation: Secondary | ICD-10-CM | POA: Diagnosis not present

## 2024-09-19 DIAGNOSIS — I10 Essential (primary) hypertension: Secondary | ICD-10-CM | POA: Diagnosis not present

## 2024-09-19 DIAGNOSIS — E782 Mixed hyperlipidemia: Secondary | ICD-10-CM | POA: Diagnosis not present

## 2024-09-19 LAB — BASIC METABOLIC PANEL WITH GFR
Anion gap: 5 (ref 5–15)
BUN: 22 mg/dL (ref 8–23)
CO2: 37 mmol/L — ABNORMAL HIGH (ref 22–32)
Calcium: 9.2 mg/dL (ref 8.9–10.3)
Chloride: 102 mmol/L (ref 98–111)
Creatinine, Ser: 0.87 mg/dL (ref 0.61–1.24)
GFR, Estimated: >60 mL/min
Glucose, Bld: 164 mg/dL — ABNORMAL HIGH (ref 70–99)
Potassium: 4.4 mmol/L (ref 3.5–5.1)
Sodium: 144 mmol/L (ref 135–145)

## 2024-09-19 LAB — CBC
HCT: 33.4 % — ABNORMAL LOW (ref 39.0–52.0)
Hemoglobin: 9.6 g/dL — ABNORMAL LOW (ref 13.0–17.0)
MCH: 23.3 pg — ABNORMAL LOW (ref 26.0–34.0)
MCHC: 28.7 g/dL — ABNORMAL LOW (ref 30.0–36.0)
MCV: 81.1 fL (ref 80.0–100.0)
Platelets: 189 K/uL (ref 150–400)
RBC: 4.12 MIL/uL — ABNORMAL LOW (ref 4.22–5.81)
RDW: 17.1 % — ABNORMAL HIGH (ref 11.5–15.5)
WBC: 8.3 K/uL (ref 4.0–10.5)
nRBC: 0 % (ref 0.0–0.2)

## 2024-09-19 LAB — GLUCOSE, CAPILLARY
Glucose-Capillary: 164 mg/dL — ABNORMAL HIGH (ref 70–99)
Glucose-Capillary: 190 mg/dL — ABNORMAL HIGH (ref 70–99)

## 2024-09-19 LAB — PROTIME-INR
INR: 3.5 — ABNORMAL HIGH (ref 0.8–1.2)
Prothrombin Time: 36.7 s — ABNORMAL HIGH (ref 11.4–15.2)

## 2024-09-19 LAB — MRSA NEXT GEN BY PCR, NASAL: MRSA by PCR Next Gen: NOT DETECTED

## 2024-09-19 NOTE — Assessment & Plan Note (Signed)
-   In the setting of COPD exacerbation and bronchiectasis/multifocal pneumonia - Continue treatment with IV antibiotics, steroids, nebulizer management, mucolytic's, flutter valve and supportive care - Wean off oxygen  supplementation as tolerated and follow clinical response.

## 2024-09-19 NOTE — Assessment & Plan Note (Signed)
-   No complaints of new focal deficit appreciated - Continue Coumadin  for secondary prevention.

## 2024-09-19 NOTE — Assessment & Plan Note (Signed)
-   No suicidal ideation or hallucination - Will continue the use of BuSpar  and also continue Atarax . - On today's examination patient was calmer and less anxious.

## 2024-09-19 NOTE — Assessment & Plan Note (Signed)
-   Continue current antihypertensive agents and follow vital signs.

## 2024-09-19 NOTE — Assessment & Plan Note (Signed)
-   No history of rupture - Continue outpatient monitoring/follow-up.

## 2024-09-19 NOTE — Assessment & Plan Note (Signed)
-   Continue current bronchodilator management, steroids, mucolytic's, flutter valve and wean off oxygen  supplementation as tolerated - Continue antibiotics as mentioned above.

## 2024-09-19 NOTE — Plan of Care (Signed)
  Problem: Clinical Measurements: Goal: Respiratory complications will improve Outcome: Not Progressing   

## 2024-09-19 NOTE — Assessment & Plan Note (Signed)
-   Most recent A1c 5.4 -Following diet control; off any hypoglycemic agents. - Continue to follow CBG fluctuation.  For now blood sugar has remained below 200 and not needing any coverage.

## 2024-09-19 NOTE — Assessment & Plan Note (Signed)
 Continue statin

## 2024-09-19 NOTE — Progress Notes (Signed)
 PHARMACY - ANTICOAGULATION CONSULT NOTE  Pharmacy Consult for warfarin Indication: atrial fibrillation  Allergies[1]  Patient Measurements: Height: 5' 10 (177.8 cm) Weight: 94.6 kg (208 lb 9.6 oz) IBW/kg (Calculated) : 73 HEPARIN  DW (KG): 91.9  Vital Signs: Temp: 98.2 F (36.8 C) (12/21 0735) Temp Source: Oral (12/21 0735) BP: 116/90 (12/21 0823) Pulse Rate: 87 (12/21 0823)  Labs: Recent Labs    09/18/24 0922 09/19/24 0354  HGB 10.4* 9.6*  HCT 36.4* 33.4*  PLT 195 189  LABPROT 29.0* 36.7*  INR 2.6* 3.5*  CREATININE 0.89 0.87    Estimated Creatinine Clearance: 86 mL/min (by C-G formula based on SCr of 0.87 mg/dL).   Medical History: Past Medical History:  Diagnosis Date   A-fib (HCC) 2017   after last lung surgery patient had a history if Afib documented in hospital    AAA (abdominal aortic aneurysm)    4.0 cm 09/17/22 US    Acute appendicitis 07/14/2021   Anxiety and depression 09/18/2024   Arthritis    back & legs ( knees)   Cancer (HCC)    lung   Complication of anesthesia    agitated upon waking from anesth.    Dyspnea    Dysrhythmia    A. Fib   Emphysema    Emphysema of lung (HCC)    GERD (gastroesophageal reflux disease) 09/18/2024   History of kidney stones    Hyperlipidemia    Hypertension    Oxygen  dependent    2L continuous   Peripheral vascular disease    Aortic Aneurysm   Pneumonia    Pre-diabetes    Sleep apnea    No OSA, but wears 2L  O2 continuous including at night   Stroke [facility]) 2022   Right sided weakness, decreased hearing, balance and memory loss   Tobacco abuse     Medications:  Medications Prior to Admission  Medication Sig Dispense Refill Last Dose/Taking   albuterol  (VENTOLIN  HFA) 108 (90 Base) MCG/ACT inhaler Inhale 2 puffs into the lungs every 4 (four) hours as needed for wheezing or shortness of breath. 8 g 2 09/18/2024 Morning   budesonide -glycopyrrolate -formoterol  (BREZTRI  AEROSPHERE) 160-9-4.8 MCG/ACT AERO  inhaler Inhale 2 puffs into the lungs 2 (two) times daily.   09/18/2024 Morning   cetirizine  (ZYRTEC ) 10 MG tablet Take 10 mg by mouth daily.   09/18/2024 Morning   diltiazem  (CARDIZEM  CD) 120 MG 24 hr capsule Take 1 capsule (120 mg total) by mouth daily. 30 capsule 1 09/18/2024 Morning   fenofibrate  (TRICOR ) 145 MG tablet Take 1 tablet (145 mg total) by mouth daily. 90 tablet 1 09/18/2024 Morning   finasteride  (PROSCAR ) 5 MG tablet Take 1 tablet by mouth once daily 30 tablet 0 09/18/2024 Morning   guaiFENesin -dextromethorphan  (ROBITUSSIN DM) 100-10 MG/5ML syrup Take 10 mLs by mouth every 8 (eight) hours as needed for cough.   09/18/2024 Morning   ipratropium-albuterol  (DUONEB) 0.5-2.5 (3) MG/3ML SOLN Take 3 mLs by nebulization every 6 (six) hours as needed. (Patient taking differently: Take 3 mLs by nebulization every 6 (six) hours.) 360 mL 1 09/18/2024 Morning   magnesium  oxide (MAG-OX) 400 (240 Mg) MG tablet Take 1 tablet (400 mg total) by mouth 2 (two) times daily. 60 tablet 5 09/18/2024 Morning   metoprolol  succinate (TOPROL  XL) 100 MG 24 hr tablet Take 1 tablet (100 mg total) by mouth daily. Take with or immediately following a meal. 30 tablet 1 09/18/2024 at  7:55 AM   rosuvastatin  (CRESTOR ) 40 MG tablet Take 1 tablet (  40 mg total) by mouth daily. 90 tablet 3 09/18/2024 Morning   tamsulosin  (FLOMAX ) 0.4 MG CAPS capsule Take 2 capsules (0.8 mg total) by mouth daily. 180 capsule 3 09/18/2024 Morning   warfarin (COUMADIN ) 5 MG tablet Take 0.5-1 tablets (2.5-5 mg total) by mouth See admin instructions. Take 2.5 mg by mouth Tuesday and Friday and take 5 mg on Monday, Wednesday, Thursday, Saturday and Sunday (Patient taking differently: Take 2.5-5 mg by mouth See admin instructions. TAD per [facility]/Dose changed on 09/13/2024)   09/17/2024 at  5:00 PM   HYDROcodone  bit-homatropine (HYCODAN) 5-1.5 MG/5ML syrup Take 5 mLs by mouth every 6 (six) hours as needed for cough. (Patient not taking:  Reported on 09/18/2024) 120 mL 0 Not Taking   hydrOXYzine  (ATARAX ) 25 MG tablet Take 1 tablet (25 mg total) by mouth every 8 (eight) hours as needed for itching. (Patient not taking: Reported on 09/18/2024) 30 tablet 5 Not Taking   OXYGEN  Inhale 2 L into the lungs continuous. Hx of lung ca, copd, and emphysema      Palonosetron  HCl (ALOXI  IV) Inject into the vein.       Assessment: Pharmacy consulted to dose warfarin in patient with atrial fibrillation.  Patient's home dose listed as 5 mg on Mon + Fri and 2.5 mg ROW.  INR on admission is therapeutic at 2.6.  INR 2.6 > 3.5 Hgb 9.6  Goal of Therapy:  INR 2-3 Monitor platelets by anticoagulation protocol: Yes   Plan:  Hold warfarin x 1 dose Monitor daily INR and s/s of bleeding  Elspeth Sour, PharmD Clinical Pharmacist 09/19/2024 9:50 AM        [1] No Active Allergies

## 2024-09-19 NOTE — Assessment & Plan Note (Signed)
-   Patient with underlying history of chronic diastolic heart failure - Continue IV diuresis for mild exacerbation - TED hoses have been ordered. - Follow daily weights and strict intake and output.

## 2024-09-19 NOTE — Progress Notes (Signed)
 " PROGRESS NOTE    Randall Sullivan  FMW:[id number] DOB: 1951-06-30 DOA: 09/18/2024  PCP: Tobie Suzzane POUR, MD    No chief complaint on file.   Brief Narrative:  Randall Sullivan is a 73 y.o. male with medical history significant of non-small cell lung cancer, COPD, chronic respiratory failure on 2-3 L supplementation, paroxysmal atrial fibrillation (on Coumadin ), AAA, hypertension, hyperlipidemia, prediabetes, BPH, and prior history of CVA; who presented to the hospital secondary to shortness of breath and worsening mildly productive coughing spells.  Patient reports that for the last 48 hours at home he has noticed increased shortness of breath and hypoxia despite high home oxygen  supplementation; nebulizer treatments have failed to improve his symptoms.   Patient expressed no fever, no chills, no sick contacts, no hemoptysis, no nausea, no vomiting, no chest pain, no hematuria/dysuria or any focal weaknesses.   Workup in the ED demonstrating the need of 6 L supplementation to maintain saturation above 90-92%; chest x-ray with multifocal pneumonia.   Patient respiratory panel by PCR negative for COVID, influenza and RSV.  Cultures taken, IV antibiotics has been started, nebulizer management and steroids provided.  TRH consulted to place patient in the hospital for further evaluation and management.   Assessment & Plan: Assessment & Plan Acute on chronic respiratory failure with hypoxia (HCC) - In the setting of COPD exacerbation and bronchiectasis/multifocal pneumonia - Continue treatment with IV antibiotics, steroids, nebulizer management, mucolytic's, flutter valve and supportive care - Wean off oxygen  supplementation as tolerated and follow clinical response.  AAA (abdominal aortic aneurysm) - No history of rupture - Continue outpatient monitoring/follow-up.  Atrial fibrillation (HCC) - Paroxysmal atrial fibrillation - Continue treatment with Cardizem  and metoprolol  - Continue Coumadin   for secondary prevention.  COPD with acute exacerbation (HCC) - Continue current bronchodilator management, steroids, mucolytic's, flutter valve and wean off oxygen  supplementation as tolerated - Continue antibiotics as mentioned above.  Anxiety and depression - No suicidal ideation or hallucination - Will continue the use of BuSpar  and also continue Atarax . - On today's examination patient was calmer and less anxious.  GERD (gastroesophageal reflux disease) - Continue PPI.  Essential hypertension - Continue current antihypertensive agents and follow vital signs.  Mixed hyperlipidemia - Continue statin.  BPH (benign prostatic hyperplasia) - Continue Flomax .  Type 2 diabetes mellitus (HCC) - Most recent A1c 5.4 -Following diet control; off any hypoglycemic agents. - Continue to follow CBG fluctuation.  For now blood sugar has remained below 200 and not needing any coverage.  Elevated brain natriuretic peptide (BNP) level - Patient with underlying history of chronic diastolic heart failure - Continue IV diuresis for mild exacerbation - TED hoses have been ordered. - Follow daily weights and strict intake and output.  Small cell lung cancer (HCC) - Apparently experiencing recurrence at the moment. - Continue patient follow-up with oncology service.  CVA (cerebral vascular accident) (HCC) - No complaints of new focal deficit appreciated - Continue Coumadin  for secondary prevention.      DVT prophylaxis: Chronically on Coumadin . Code Status: Full code. Family Communication: No family at bedside. Disposition:   Status is: Inpatient Remains inpatient appropriate because: Continue IV therapy.   Consultants:  None  Procedures:  See below for x-ray reports.  Antimicrobials:  Rocephin  and Zithromax .  Subjective: Afebrile, no chest pain, no nausea, no vomiting.  Patient is complaining of difficulty speaking in full sentences and shortness of breath.  Overall  feeling better.  Objective: Vitals:   09/19/24 1151 09/19/24 1200  09/19/24 1300 09/19/24 1500  BP:  95/82 110/76 121/65  Pulse:  89 83 92  Resp:  (!) 29 16 (!) 24  Temp: 98.3 F (36.8 C)     TempSrc: Axillary     SpO2:  95% 99% 100%  Weight:      Height:       No intake or output data in the 24 hours ending 09/19/24 1628 Filed Weights   09/18/24 0900 09/19/24 0520  Weight: 93.4 kg 94.6 kg    Examination:  General exam: Still complaining of difficulty breathing; requiring 3-4 L nasal cannula supplementation.  Patient unable to speak in full sentences. Respiratory system: Fair air movement bilaterally; decreased breath sounds at the bases appreciated on exam.  No using accessory muscles.  Positive tachypnea appreciated intermittently.  Expiratory wheezing appreciated on exam. Cardiovascular system: Rate controlled, no rubs, no gallops, no JVD. Gastrointestinal system: Abdomen is nondistended, soft and nontender. No organomegaly or masses felt. Normal bowel sounds heard. Central nervous system: No new focal neurological deficits. Extremities: No cyanosis or clubbing. Skin: No petechiae.  Reporting generalized itching.  Multiple scabs appreciated on his upper limbs and upper back from scratching.  No open wounds or drainage appreciated. Psychiatry: Judgement and insight appear normal.  Flat affect appreciated on exam.    Data Reviewed: I have personally reviewed following labs and imaging studies  CBC: Recent Labs  Lab 09/18/24 0922 09/19/24 0354  WBC 8.4 8.3  NEUTROABS 6.5  --   HGB 10.4* 9.6*  HCT 36.4* 33.4*  MCV 81.8 81.1  PLT 195 189    Basic Metabolic Panel: Recent Labs  Lab 09/18/24 0922 09/18/24 1204 09/19/24 0354  NA 144  --  144  K 4.3  --  4.4  CL 103  --  102  CO2 38*  --  37*  GLUCOSE 104*  --  164*  BUN 16  --  22  CREATININE 0.89  --  0.87  CALCIUM  9.4  --  9.2  MG  --  1.9  --   PHOS  --  2.5  --     GFR: Estimated Creatinine Clearance:  86 mL/min (by C-G formula based on SCr of 0.87 mg/dL).  Liver Function Tests: Recent Labs  Lab 09/18/24 0922  AST 20  ALT 16  ALKPHOS 48  BILITOT 0.8  PROT 6.7  ALBUMIN  4.0    CBG: Recent Labs  Lab 09/18/24 2046 09/19/24 0733 09/19/24 1229  GLUCAP 250* 164* 190*    Recent Results (from the past 240 hours)  Culture, blood (Routine x 2)     Status: None (Preliminary result)   Collection Time: 09/18/24  9:22 AM   Specimen: BLOOD RIGHT ARM  Result Value Ref Range Status   Specimen Description   Final    BLOOD RIGHT ARM BOTTLES DRAWN AEROBIC AND ANAEROBIC   Special Requests Blood Culture adequate volume  Final   Culture   Final    NO GROWTH < 24 HOURS Performed at [facility], [address] [place], [place] [address]    Report Status PENDING  Incomplete  Culture, blood (Routine x 2)     Status: None (Preliminary result)   Collection Time: 09/18/24  9:22 AM   Specimen: BLOOD RIGHT ARM  Result Value Ref Range Status   Specimen Description   Final    BLOOD RIGHT ARM BOTTLES DRAWN AEROBIC AND ANAEROBIC   Special Requests Blood Culture adequate volume  Final   Culture   Final  NO GROWTH < 24 HOURS Performed at [facility], [address], [place], [place] [address]    Report Status PENDING  Incomplete  Resp panel by RT-PCR (RSV, Flu A&B, Covid) Anterior Nasal Swab     Status: None   Collection Time: 09/18/24  9:22 AM   Specimen: Anterior Nasal Swab  Result Value Ref Range Status   SARS Coronavirus 2 by RT PCR NEGATIVE NEGATIVE Final    Comment: (NOTE) SARS-CoV-2 target nucleic acids are NOT DETECTED.  The SARS-CoV-2 RNA is generally detectable in upper respiratory specimens during the acute phase of infection. The lowest concentration of SARS-CoV-2 viral copies this assay can detect is 138 copies/mL. A negative result does not preclude SARS-Cov-2 infection and should not be used as the sole basis for treatment or other patient management decisions. A negative  result may occur with  improper specimen collection/handling, submission of specimen other than nasopharyngeal swab, presence of viral mutation(s) within the areas targeted by this assay, and inadequate number of viral copies(<138 copies/mL). A negative result must be combined with clinical observations, patient history, and epidemiological information. The expected result is Negative.  Fact Sheet for Patients:  bloggercourse.com  Fact Sheet for Healthcare Providers:  seriousbroker.it  This test is no t yet approved or cleared by the [place]  FDA and  has been authorized for detection and/or diagnosis of SARS-CoV-2 by FDA under an Emergency Use Authorization (EUA). This EUA will remain  in effect (meaning this test can be used) for the duration of the COVID-19 declaration under Section 564(b)(1) of the Act, 21 U.S.C.section 360bbb-3(b)(1), unless the authorization is terminated  or revoked sooner.       Influenza A by PCR NEGATIVE NEGATIVE Final   Influenza B by PCR NEGATIVE NEGATIVE Final    Comment: (NOTE) The Xpert Xpress SARS-CoV-2/FLU/RSV plus assay is intended as an aid in the diagnosis of influenza from Nasopharyngeal swab specimens and should not be used as a sole basis for treatment. Nasal washings and aspirates are unacceptable for Xpert Xpress SARS-CoV-2/FLU/RSV testing.  Fact Sheet for Patients: bloggercourse.com  Fact Sheet for Healthcare Providers: seriousbroker.it  This test is not yet approved or cleared by the [place]  FDA and has been authorized for detection and/or diagnosis of SARS-CoV-2 by FDA under an Emergency Use Authorization (EUA). This EUA will remain in effect (meaning this test can be used) for the duration of the COVID-19 declaration under Section 564(b)(1) of the Act, 21 U.S.C. section 360bbb-3(b)(1), unless the authorization is  terminated or revoked.     Resp Syncytial Virus by PCR NEGATIVE NEGATIVE Final    Comment: (NOTE) Fact Sheet for Patients: bloggercourse.com  Fact Sheet for Healthcare Providers: seriousbroker.it  This test is not yet approved or cleared by the [place]  FDA and has been authorized for detection and/or diagnosis of SARS-CoV-2 by FDA under an Emergency Use Authorization (EUA). This EUA will remain in effect (meaning this test can be used) for the duration of the COVID-19 declaration under Section 564(b)(1) of the Act, 21 U.S.C. section 360bbb-3(b)(1), unless the authorization is terminated or revoked.  Performed at [facility], [address] [place], [place] [address]   MRSA Next Gen by PCR, Nasal     Status: None   Collection Time: 09/18/24  5:00 PM   Specimen: Nasal Mucosa; Nasal Swab  Result Value Ref Range Status   MRSA by PCR Next Gen NOT DETECTED NOT DETECTED Final    Comment: (NOTE) The GeneXpert MRSA Assay (FDA approved  for NASAL specimens only), is one component of a comprehensive MRSA colonization surveillance program. It is not intended to diagnose MRSA infection nor to guide or monitor treatment for MRSA infections. Test performance is not FDA approved in patients less than 36 years old. Performed at [facility], [address], [place], [place] [address]     Radiology Studies: DG [facility] if patient is in a treatment room. Result Date: 09/18/2024 EXAM: 1 VIEW(S) XRAY OF THE CHEST 09/18/2024 09:27:00 AM COMPARISON: 08/31/2024 CLINICAL HISTORY: Suspected Sepsis FINDINGS: LINES, TUBES AND DEVICES: Left chest port stable in position. LUNGS AND PLEURA: Surgical clips in left upper lung. New small right pleural effusion. Increased patchy airspace opacities in right lung. Increased interstitial opacities in left lung base. Postsurgical changes in right upper lung. No pneumothorax. HEART AND MEDIASTINUM: No  acute abnormality of the cardiac and mediastinal silhouettes. BONES AND SOFT TISSUES: No acute osseous abnormality. IMPRESSION: 1. Increased patchy airspace opacities in the right lung and increased interstitial opacities in the left lung base, suspicious for infection in the setting of sepsis. 2. New small right pleural effusion. Electronically signed by: Taylor Stroud MD 09/18/2024 09:31 AM EST RP Workstation: [id number]   Scheduled Meds:  arformoterol   15 mcg Nebulization BID   azithromycin   250 mg Oral Daily   budesonide  (PULMICORT ) nebulizer solution  0.5 mg Nebulization BID   busPIRone   5 mg Oral TID   Chlorhexidine  Gluconate Cloth  6 each Topical Q0600   dextromethorphan -guaiFENesin   1 tablet Oral BID   diltiazem   120 mg Oral Daily   ipratropium-albuterol   3 mL Nebulization TID   loratadine   10 mg Oral Daily   methylPREDNISolone  (SOLU-MEDROL ) injection  40 mg Intravenous Q12H   metoprolol  succinate  100 mg Oral Daily   pantoprazole   40 mg Oral BID   rosuvastatin   40 mg Oral Daily   tamsulosin   0.8 mg Oral Daily   Warfarin - Pharmacist Dosing Inpatient   Does not apply q1600   Continuous Infusions:  cefTRIAXone  (ROCEPHIN )  IV 1 g (09/19/24 0833)     LOS: 1 day    Time spent: 50 minutes    Eric Nunnery, MD [facility]   To contact the attending provider between 7A-7P or the covering provider during after hours 7P-7A, please log into the web site [email or web address] and access using universal [place] password for that web site. If you do not have the password, please call the hospital operator.  09/19/2024, 4:28 PM    "

## 2024-09-19 NOTE — Assessment & Plan Note (Signed)
 Patient will continue tums Ultra

## 2024-09-19 NOTE — Assessment & Plan Note (Signed)
 Continue Flomax .

## 2024-09-19 NOTE — Assessment & Plan Note (Signed)
-   Apparently experiencing recurrence at the moment. - Continue patient follow-up with oncology service.

## 2024-09-19 NOTE — Assessment & Plan Note (Signed)
-   Paroxysmal atrial fibrillation - Continue treatment with Cardizem  and metoprolol  - Continue Coumadin  for secondary prevention.

## 2024-09-20 DIAGNOSIS — E782 Mixed hyperlipidemia: Secondary | ICD-10-CM | POA: Diagnosis not present

## 2024-09-20 DIAGNOSIS — N400 Enlarged prostate without lower urinary tract symptoms: Secondary | ICD-10-CM | POA: Diagnosis not present

## 2024-09-20 DIAGNOSIS — K219 Gastro-esophageal reflux disease without esophagitis: Secondary | ICD-10-CM | POA: Diagnosis not present

## 2024-09-20 DIAGNOSIS — I639 Cerebral infarction, unspecified: Secondary | ICD-10-CM | POA: Diagnosis not present

## 2024-09-20 DIAGNOSIS — C3490 Malignant neoplasm of unspecified part of unspecified bronchus or lung: Secondary | ICD-10-CM | POA: Diagnosis not present

## 2024-09-20 DIAGNOSIS — J441 Chronic obstructive pulmonary disease with (acute) exacerbation: Secondary | ICD-10-CM | POA: Diagnosis not present

## 2024-09-20 DIAGNOSIS — R7989 Other specified abnormal findings of blood chemistry: Secondary | ICD-10-CM | POA: Diagnosis not present

## 2024-09-20 DIAGNOSIS — J9621 Acute and chronic respiratory failure with hypoxia: Secondary | ICD-10-CM | POA: Diagnosis not present

## 2024-09-20 DIAGNOSIS — I10 Essential (primary) hypertension: Secondary | ICD-10-CM | POA: Diagnosis not present

## 2024-09-20 DIAGNOSIS — I480 Paroxysmal atrial fibrillation: Secondary | ICD-10-CM | POA: Diagnosis not present

## 2024-09-20 DIAGNOSIS — I7143 Infrarenal abdominal aortic aneurysm, without rupture: Secondary | ICD-10-CM | POA: Diagnosis not present

## 2024-09-20 DIAGNOSIS — E1159 Type 2 diabetes mellitus with other circulatory complications: Secondary | ICD-10-CM | POA: Diagnosis not present

## 2024-09-20 LAB — PROTIME-INR
INR: 3.7 — ABNORMAL HIGH (ref 0.8–1.2)
Prothrombin Time: 38.3 s — ABNORMAL HIGH (ref 11.4–15.2)

## 2024-09-20 LAB — GLUCOSE, CAPILLARY
Glucose-Capillary: 154 mg/dL — ABNORMAL HIGH (ref 70–99)
Glucose-Capillary: 212 mg/dL — ABNORMAL HIGH (ref 70–99)
Glucose-Capillary: 154 mg/dL — ABNORMAL HIGH (ref 70–99)
Glucose-Capillary: 228 mg/dL — ABNORMAL HIGH (ref 70–99)

## 2024-09-20 NOTE — Plan of Care (Signed)

## 2024-09-20 NOTE — Assessment & Plan Note (Signed)
 Patient will continue tums Ultra

## 2024-09-20 NOTE — Assessment & Plan Note (Signed)
-   Continue current bronchodilator management, steroids, mucolytic's, flutter valve and wean off oxygen  supplementation as tolerated - Continue antibiotics as mentioned above.

## 2024-09-20 NOTE — Assessment & Plan Note (Signed)
-   Patient with history of CVA - No complaints of new focal deficit appreciated - Continue Coumadin  for secondary prevention.

## 2024-09-20 NOTE — Assessment & Plan Note (Signed)
-   Continue Flomax . - Patient reports no complaints of urinary retention.

## 2024-09-20 NOTE — Progress Notes (Signed)
 " PROGRESS NOTE    Randall Sullivan  FMW:[id number] DOB: 09/17/1951 DOA: 09/18/2024  PCP: Randall Suzzane POUR, MD    No chief complaint on file.   Brief Narrative:  Randall Sullivan is a 73 y.o. male with medical history significant of non-small cell lung cancer, COPD, chronic respiratory failure on 2-3 L supplementation, paroxysmal atrial fibrillation (on Coumadin ), AAA, hypertension, hyperlipidemia, prediabetes, BPH, and prior history of CVA; who presented to the hospital secondary to shortness of breath and worsening mildly productive coughing spells.  Patient reports that for the last 48 hours at home he has noticed increased shortness of breath and hypoxia despite high home oxygen  supplementation; nebulizer treatments have failed to improve his symptoms.   Patient expressed no fever, no chills, no sick contacts, no hemoptysis, no nausea, no vomiting, no chest pain, no hematuria/dysuria or any focal weaknesses.   Workup in the ED demonstrating the need of 6 L supplementation to maintain saturation above 90-92%; chest x-ray with multifocal pneumonia.   Patient respiratory panel by PCR negative for COVID, influenza and RSV.  Cultures taken, IV antibiotics has been started, nebulizer management and steroids provided.  TRH consulted to place patient in the hospital for further evaluation and management.   Assessment & Plan: Assessment & Plan Acute on chronic respiratory failure with hypoxia (HCC) - In the setting of COPD exacerbation and bronchiectasis/multifocal pneumonia - Continue treatment with IV antibiotics, steroids, nebulizer management, mucolytic's, flutter valve and supportive care - Oxygen  supplementation essentially back to his baseline at the moment; no using accessory muscles. - Planning to transition treatment to oral route in the next 24 hours and discharged home with outpatient follow-up.  AAA (abdominal aortic aneurysm) - No history of rupture - Continue outpatient  monitoring/follow-up. - Vital signs are stable; patient reports no abdominal pain.  Atrial fibrillation (HCC) - Paroxysmal atrial fibrillation - Continue treatment with Cardizem  and metoprolol  - Continue Coumadin  for secondary prevention. - Appreciate assistance by pharmacy adjusting Coumadin  dose.  COPD with acute exacerbation (HCC) - Continue current bronchodilator management, steroids, mucolytic's, flutter valve and wean off oxygen  supplementation as tolerated - Continue antibiotics as mentioned above.  Anxiety and depression - No suicidal ideation or hallucination - Will continue the use of BuSpar  and also continue Atarax . - Patient has responded tremendously to the use of BuSpar  and as needed Atarax  - Reports to be less itchy and significantly less anxious.  GERD (gastroesophageal reflux disease) - Continue PPI.  Essential hypertension - Continue current antihypertensive agents and follow vital signs.  Mixed hyperlipidemia - Continue statin. - Heart healthy diet discussed with patient.  BPH (benign prostatic hyperplasia) - Continue Flomax . - Patient reports no complaints of urinary retention.  Type 2 diabetes mellitus (HCC) - Most recent A1c 5.4 -Following diet control; off any hypoglycemic agents. - Continue to follow CBG fluctuation.  For now blood sugar has remained below 200 and not needing any coverage.  Elevated brain natriuretic peptide (BNP) level - Patient with underlying history of chronic diastolic heart failure - Continue IV diuresis for mild exacerbation - TED hoses have been ordered. - Follow daily weights and strict intake and output.  Small cell lung cancer (HCC) - Apparently experiencing recurrence at the moment. - Continue outpatient follow-up with oncology service.  CVA (cerebral vascular accident) [facility]) - Patient with history of CVA - No complaints of new focal deficit appreciated - Continue Coumadin  for secondary prevention.      DVT  prophylaxis: Chronically on Coumadin . Code Status: Full  code. Family Communication: No family at bedside. Disposition:   Status is: Inpatient Remains inpatient appropriate because: Continue IV therapy.   Consultants:  None  Procedures:  See below for x-ray reports.  Antimicrobials:  Rocephin  and Zithromax .  Subjective: No fever, no chest pain, no nausea, no vomiting.  Good urine output has been reported.  Reports breathing easier and overall feeling better.  Objective: Vitals:   09/20/24 0705 09/20/24 1104 09/20/24 1235 09/20/24 1344  BP:    111/79  Pulse:    84  Resp:    20  Temp:    (!) 97.5 F (36.4 C)  TempSrc:    Oral  SpO2: 94% 96% 97% 96%  Weight:      Height:        Intake/Output Summary (Last 24 hours) at 09/20/2024 1708 Last data filed at 09/19/2024 1723 Gross per 24 hour  Intake 240 ml  Output --  Net 240 ml   Filed Weights   09/18/24 0900 09/19/24 0520 09/20/24 0606  Weight: 93.4 kg 94.6 kg 94.8 kg    Examination: General exam: Alert, awake, following commands appropriately and breathing easier.  Patient reports no nausea, no vomiting and denies chest pain. Respiratory system: Improved air movement bilaterally; positive scattered rhonchi appreciated on exam.  Mild expiratory wheezing heard on auscultation.  No using accessory muscles. Cardiovascular system: Rate controlled, no rubs, no gallops, no JVD. Gastrointestinal system: Abdomen is nondistended, soft and nontender. No organomegaly or masses felt. Normal bowel sounds heard. Central nervous system: No new focal neurological deficits. Extremities: No cyanosis or clubbing. Skin: No petechiae. Psychiatry: Judgement and insight appear normal.  Flat affect appreciated on exam.    Data Reviewed: I have personally reviewed following labs and imaging studies  CBC: Recent Labs  Lab 09/18/24 0922 09/19/24 0354  WBC 8.4 8.3  NEUTROABS 6.5  --   HGB 10.4* 9.6*  HCT 36.4* 33.4*  MCV 81.8 81.1   PLT 195 189    Basic Metabolic Panel: Recent Labs  Lab 09/18/24 0922 09/18/24 1204 09/19/24 0354  NA 144  --  144  K 4.3  --  4.4  CL 103  --  102  CO2 38*  --  37*  GLUCOSE 104*  --  164*  BUN 16  --  22  CREATININE 0.89  --  0.87  CALCIUM  9.4  --  9.2  MG  --  1.9  --   PHOS  --  2.5  --     GFR: Estimated Creatinine Clearance: 86.1 mL/min (by C-G formula based on SCr of 0.87 mg/dL).  Liver Function Tests: Recent Labs  Lab 09/18/24 0922  AST 20  ALT 16  ALKPHOS 48  BILITOT 0.8  PROT 6.7  ALBUMIN  4.0    CBG: Recent Labs  Lab 09/19/24 0733 09/19/24 1229 09/20/24 0716 09/20/24 1105 09/20/24 1609  GLUCAP 164* 190* 154* 212* 154*    Recent Results (from the past 240 hours)  Culture, blood (Routine x 2)     Status: None (Preliminary result)   Collection Time: 09/18/24  9:22 AM   Specimen: BLOOD RIGHT ARM  Result Value Ref Range Status   Specimen Description   Final    BLOOD RIGHT ARM BOTTLES DRAWN AEROBIC AND ANAEROBIC   Special Requests Blood Culture adequate volume  Final   Culture   Final    NO GROWTH 2 DAYS Performed at [facility], [address] [place], [place] [address]    Report Status PENDING  Incomplete  Culture, blood (Routine x 2)     Status: None (Preliminary result)   Collection Time: 09/18/24  9:22 AM   Specimen: BLOOD RIGHT ARM  Result Value Ref Range Status   Specimen Description   Final    BLOOD RIGHT ARM BOTTLES DRAWN AEROBIC AND ANAEROBIC   Special Requests Blood Culture adequate volume  Final   Culture   Final    NO GROWTH 2 DAYS Performed at [facility], [address] [place], [place] [address]    Report Status PENDING  Incomplete  Resp panel by RT-PCR (RSV, Flu A&B, Covid) Anterior Nasal Swab     Status: None   Collection Time: 09/18/24  9:22 AM   Specimen: Anterior Nasal Swab  Result Value Ref Range Status   SARS Coronavirus 2 by RT PCR NEGATIVE NEGATIVE Final    Comment: (NOTE) SARS-CoV-2 target nucleic acids are  NOT DETECTED.  The SARS-CoV-2 RNA is generally detectable in upper respiratory specimens during the acute phase of infection. The lowest concentration of SARS-CoV-2 viral copies this assay can detect is 138 copies/mL. A negative result does not preclude SARS-Cov-2 infection and should not be used as the sole basis for treatment or other patient management decisions. A negative result may occur with  improper specimen collection/handling, submission of specimen other than nasopharyngeal swab, presence of viral mutation(s) within the areas targeted by this assay, and inadequate number of viral copies(<138 copies/mL). A negative result must be combined with clinical observations, patient history, and epidemiological information. The expected result is Negative.  Fact Sheet for Patients:  bloggercourse.com  Fact Sheet for Healthcare Providers:  seriousbroker.it  This test is no t yet approved or cleared by the [place]  FDA and  has been authorized for detection and/or diagnosis of SARS-CoV-2 by FDA under an Emergency Use Authorization (EUA). This EUA will remain  in effect (meaning this test can be used) for the duration of the COVID-19 declaration under Section 564(b)(1) of the Act, 21 U.S.C.section 360bbb-3(b)(1), unless the authorization is terminated  or revoked sooner.       Influenza A by PCR NEGATIVE NEGATIVE Final   Influenza B by PCR NEGATIVE NEGATIVE Final    Comment: (NOTE) The Xpert Xpress SARS-CoV-2/FLU/RSV plus assay is intended as an aid in the diagnosis of influenza from Nasopharyngeal swab specimens and should not be used as a sole basis for treatment. Nasal washings and aspirates are unacceptable for Xpert Xpress SARS-CoV-2/FLU/RSV testing.  Fact Sheet for Patients: bloggercourse.com  Fact Sheet for Healthcare Providers: seriousbroker.it  This test is not  yet approved or cleared by the [place]  FDA and has been authorized for detection and/or diagnosis of SARS-CoV-2 by FDA under an Emergency Use Authorization (EUA). This EUA will remain in effect (meaning this test can be used) for the duration of the COVID-19 declaration under Section 564(b)(1) of the Act, 21 U.S.C. section 360bbb-3(b)(1), unless the authorization is terminated or revoked.     Resp Syncytial Virus by PCR NEGATIVE NEGATIVE Final    Comment: (NOTE) Fact Sheet for Patients: bloggercourse.com  Fact Sheet for Healthcare Providers: seriousbroker.it  This test is not yet approved or cleared by the [place]  FDA and has been authorized for detection and/or diagnosis of SARS-CoV-2 by FDA under an Emergency Use Authorization (EUA). This EUA will remain in effect (meaning this test can be used) for the duration of the COVID-19 declaration under Section 564(b)(1) of the Act, 21 U.S.C. section 360bbb-3(b)(1), unless the authorization is terminated or  revoked.  Performed at [facility], [address] [place], [place] [address]   MRSA Next Gen by PCR, Nasal     Status: None   Collection Time: 09/18/24  5:00 PM   Specimen: Nasal Mucosa; Nasal Swab  Result Value Ref Range Status   MRSA by PCR Next Gen NOT DETECTED NOT DETECTED Final    Comment: (NOTE) The GeneXpert MRSA Assay (FDA approved for NASAL specimens only), is one component of a comprehensive MRSA colonization surveillance program. It is not intended to diagnose MRSA infection nor to guide or monitor treatment for MRSA infections. Test performance is not FDA approved in patients less than 44 years old. Performed at [facility], [address] [place], [place] [address]     Radiology Studies: No results found.  Scheduled Meds:  arformoterol   15 mcg Nebulization BID   azithromycin   250 mg Oral Daily   budesonide  (PULMICORT ) nebulizer solution  0.5 mg  Nebulization BID   busPIRone   5 mg Oral TID   Chlorhexidine  Gluconate Cloth  6 each Topical Q0600   dextromethorphan -guaiFENesin   1 tablet Oral BID   diltiazem   120 mg Oral Daily   ipratropium-albuterol   3 mL Nebulization TID   loratadine   10 mg Oral Daily   methylPREDNISolone  (SOLU-MEDROL ) injection  40 mg Intravenous Q12H   metoprolol  succinate  100 mg Oral Daily   pantoprazole   40 mg Oral BID   rosuvastatin   40 mg Oral Daily   tamsulosin   0.8 mg Oral Daily   Warfarin - Pharmacist Dosing Inpatient   Does not apply q1600   Continuous Infusions:  cefTRIAXone  (ROCEPHIN )  IV 1 g (09/20/24 0916)     LOS: 2 days    Time spent: 50 minutes    Eric Nunnery, MD [facility]   To contact the attending provider between 7A-7P or the covering provider during after hours 7P-7A, please log into the web site [email or web address] and access using universal [place] password for that web site. If you do not have the password, please call the hospital operator.  09/20/2024, 5:08 PM    "

## 2024-09-20 NOTE — Assessment & Plan Note (Signed)
-   No suicidal ideation or hallucination - Will continue the use of BuSpar  and also continue Atarax . - Patient has responded tremendously to the use of BuSpar  and as needed Atarax  - Reports to be less itchy and significantly less anxious.

## 2024-09-20 NOTE — Assessment & Plan Note (Signed)
-   Continue current antihypertensive agents and follow vital signs.

## 2024-09-20 NOTE — Assessment & Plan Note (Signed)
-   Paroxysmal atrial fibrillation - Continue treatment with Cardizem  and metoprolol  - Continue Coumadin  for secondary prevention. - Appreciate assistance by pharmacy adjusting Coumadin  dose.

## 2024-09-20 NOTE — TOC Initial Note (Addendum)
 Transition of Care [facility]) - Initial/Assessment Note    Patient Details  Name: Randall Sullivan MRN: [id number] Date of Birth: 08/20/1951  Transition of Care [facility]) CM/SW Contact:    [name] MARLA Sil, RN Phone Number: 09/20/2024, 12:46 PM  Clinical Narrative:                  Acute on chronic respiratory failure with hypoxia [facility]) Patient with PMH of MMP's including NSCLCa, COPD, and chronic respiratory failure on continuous Home O2 2-3 L via [place] with [facility], previously admitted 9/22 - 06/26/24 for Afib w/ RVR, COPD Exac, & CAP, 11/3 - 08/05/24 for Sepsis r/t Lobar PNA & COPD Exac, admitted 12/20 for COPD Exac    Patient confirmed he lives with spouse, is independent with ADLs with use of RW for ambulation, Spouse drives, and relies on Home O2 2 - 3L continuously - believes it's provided by [facility], sent text msg to [facility] for confirmation.  Patient confirmed he has a RW and cane   IPCM team will continue to follow along and assist as appropriate with d/c needs.  Expected Discharge Plan: Home w Home Health Services Barriers to Discharge: Continued Medical Work up   Patient Goals and CMS Choice Patient states their goals for this hospitalization and ongoing recovery are:: Return Home with [facility]  Expected Discharge Plan and Services  Post Acute Care Choice: Home Health Living arrangements for the past 2 months: Single Family Home HH Arranged: PT, RN HH Agency: [facility] Date HH Agency Contacted: 09/20/24 Representative spoke with at [facility] Agency: [place] with [facility]  Prior Living Arrangements/Services Living arrangements for the past 2 months: Single Family Home Lives with:: Spouse (Souse - Randall Sullivan) Do you feel safe going back to the place where you live?: Yes      Current home services: DME, Home RN, Home PT ([facility] HH[facility] & RN; Home O2 thru [facility])  Activities of Daily Living   ADL Screening (condition at time of admission) Independently performs ADLs?: Yes  (appropriate for developmental age) Is the patient deaf or have difficulty hearing?: No Does the patient have difficulty seeing, even when wearing glasses/contacts?: No Does the patient have difficulty concentrating, remembering, or making decisions?: No  Permission Sought/Granted  Emotional Assessment Appearance:: Appears stated age Attitude/Demeanor/Rapport: Engaged Affect (typically observed): Accepting, Appropriate Orientation: : Oriented to Self, Oriented to Place, Oriented to  Time, Oriented to Situation  Admission diagnosis:  Hypoxia [R09.02] COPD exacerbation (HCC) [J44.1] Acute on chronic respiratory failure with hypoxia (HCC) [J96.21] Multifocal pneumonia [J18.8] Patient Active Problem List   Diagnosis Date Noted   Acute on chronic respiratory failure with hypoxia (HCC) 09/18/2024   COPD with acute exacerbation (HCC) 09/18/2024   Anxiety and depression 09/18/2024   GERD (gastroesophageal reflux disease) 09/18/2024   BPH (benign prostatic hyperplasia) 09/18/2024   Elevated brain natriuretic peptide (BNP) level 09/18/2024   CVA (cerebral vascular accident) (HCC) 09/18/2024   Sepsis due to undetermined organism (HCC) 08/02/2024   Lobar pneumonia 08/02/2024   Atrial fibrillation with RVR (HCC) 06/21/2024   CAP (community acquired pneumonia) 06/21/2024   Atrial fibrillation (HCC) 06/21/2024   Peripheral neuropathy 05/25/2024   Allergic dermatitis 05/11/2024   Small cell lung cancer (HCC) 12/03/2023   COPD exacerbation (HCC) 09/14/2023   Chronic respiratory failure with hypoxia (HCC) 03/04/2023   OSA (obstructive sleep apnea) 11/25/2022   Elevated coronary artery calcium  score 09/03/2022   Insomnia 11/09/2021   History of CVA with residual deficit 11/09/2021  Decreased peripheral vision of both eyes 11/09/2021   S/P appendectomy 11/09/2021   AAA (abdominal aortic aneurysm) 07/14/2021   Type 2 diabetes mellitus (HCC) 07/09/2021   Benign prostatic hyperplasia with  nocturia 06/25/2021   Right thalamic infarction (HCC) 03/14/2021   Right sided weakness 03/12/2021   Aortic atherosclerosis 08/30/2020   Mixed hyperlipidemia 11/17/2019   S/P Wedge Resection RUL, Blebectomy RUL 12/09/2018   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 [place] [address] Phone: [phone] Fax: [phone]  [facility] - [place], [place] - [address] [employer] [address] [place] [address] Phone: [phone] Fax: [phone]  [facility] [place], [place] - [address] [place] [address] Phone: [phone] Fax: [phone]  [facility] - [place], [place] - [address] [place] [address] Phone: [phone] Fax: [phone]  Social Drivers of Health (SDOH) Social History: SDOH Screenings   Food Insecurity: No Food Insecurity (09/18/2024)  Housing: Low Risk (09/18/2024)  Transportation Needs: No Transportation Needs (09/18/2024)  Utilities: Not At Risk (09/18/2024)  Depression (PHQ2-9): Low Risk (08/24/2024)  Financial Resource Strain: Low Risk (08/23/2024)  Physical Activity: Insufficiently Active (08/23/2024)  Social Connections: Socially Isolated (09/18/2024)  Stress: No Stress Concern Present (08/23/2024)  Tobacco Use: Medium Risk (09/18/2024)   SDOH Interventions:     Readmission Risk Interventions    09/20/2024   11:50 AM 08/05/2024    9:12 AM 08/03/2024     3:21 PM  Readmission Risk Prevention Plan  Transportation Screening Complete Complete Complete  PCP or Specialist Appt within 5-7 Days   Complete  PCP or Specialist Appt within 3-5 Days Complete    Home Care Screening   Complete  Medication Review (RN CM)   Complete  HRI or Home Care Consult Complete Complete   Social Work Consult for Recovery Care Planning/Counseling  Complete   Palliative Care Screening  Not Applicable   Medication Review Oceanographer) Complete Complete

## 2024-09-20 NOTE — Assessment & Plan Note (Signed)
-   Apparently experiencing recurrence at the moment. - Continue outpatient follow-up with oncology service.

## 2024-09-20 NOTE — Assessment & Plan Note (Signed)
-   Most recent A1c 5.4 -Following diet control; off any hypoglycemic agents. - Continue to follow CBG fluctuation.  For now blood sugar has remained below 200 and not needing any coverage.

## 2024-09-20 NOTE — Progress Notes (Signed)
 PHARMACY - ANTICOAGULATION CONSULT NOTE  Pharmacy Consult for warfarin Indication: atrial fibrillation  Allergies[1]  Patient Measurements: Height: 5' 10 (177.8 cm) Weight: 94.8 kg (208 lb 15.9 oz) IBW/kg (Calculated) : 73 HEPARIN  DW (KG): 91.9  Vital Signs: Temp: 98.4 F (36.9 C) (12/22 0602) Temp Source: Oral (12/22 0602) BP: 141/86 (12/22 0602) Pulse Rate: 85 (12/22 0602)  Labs: Recent Labs    09/18/24 0922 09/19/24 0354 09/20/24 0528  HGB 10.4* 9.6*  --   HCT 36.4* 33.4*  --   PLT 195 189  --   LABPROT 29.0* 36.7* 38.3*  INR 2.6* 3.5* 3.7*  CREATININE 0.89 0.87  --     Estimated Creatinine Clearance: 86.1 mL/min (by C-G formula based on SCr of 0.87 mg/dL).   Medical History: Past Medical History:  Diagnosis Date   A-fib (HCC) 2017   after last lung surgery patient had a history if Afib documented in hospital    AAA (abdominal aortic aneurysm)    4.0 cm 09/17/22 US    Acute appendicitis 07/14/2021   Anxiety and depression 09/18/2024   Arthritis    back & legs ( knees)   Cancer (HCC)    lung   Complication of anesthesia    agitated upon waking from anesth.    Dyspnea    Dysrhythmia    A. Fib   Emphysema    Emphysema of lung (HCC)    GERD (gastroesophageal reflux disease) 09/18/2024   History of kidney stones    Hyperlipidemia    Hypertension    Oxygen  dependent    2L continuous   Peripheral vascular disease    Aortic Aneurysm   Pneumonia    Pre-diabetes    Sleep apnea    No OSA, but wears 2L [place] O2 continuous including at night   Stroke [facility]) 2022   Right sided weakness, decreased hearing, balance and memory loss   Tobacco abuse     Medications:  Medications Prior to Admission  Medication Sig Dispense Refill Last Dose/Taking   albuterol  (VENTOLIN  HFA) 108 (90 Base) MCG/ACT inhaler Inhale 2 puffs into the lungs every 4 (four) hours as needed for wheezing or shortness of breath. 8 g 2 09/18/2024 Morning    budesonide -glycopyrrolate -formoterol  (BREZTRI  AEROSPHERE) 160-9-4.8 MCG/ACT AERO inhaler Inhale 2 puffs into the lungs 2 (two) times daily.   09/18/2024 Morning   cetirizine  (ZYRTEC ) 10 MG tablet Take 10 mg by mouth daily.   09/18/2024 Morning   diltiazem  (CARDIZEM  CD) 120 MG 24 hr capsule Take 1 capsule (120 mg total) by mouth daily. 30 capsule 1 09/18/2024 Morning   fenofibrate  (TRICOR ) 145 MG tablet Take 1 tablet (145 mg total) by mouth daily. 90 tablet 1 09/18/2024 Morning   finasteride  (PROSCAR ) 5 MG tablet Take 1 tablet by mouth once daily 30 tablet 0 09/18/2024 Morning   guaiFENesin -dextromethorphan  (ROBITUSSIN DM) 100-10 MG/5ML syrup Take 10 mLs by mouth every 8 (eight) hours as needed for cough.   09/18/2024 Morning   ipratropium-albuterol  (DUONEB) 0.5-2.5 (3) MG/3ML SOLN Take 3 mLs by nebulization every 6 (six) hours as needed. (Patient taking differently: Take 3 mLs by nebulization every 6 (six) hours.) 360 mL 1 09/18/2024 Morning   magnesium  oxide (MAG-OX) 400 (240 Mg) MG tablet Take 1 tablet (400 mg total) by mouth 2 (two) times daily. 60 tablet 5 09/18/2024 Morning   metoprolol  succinate (TOPROL  XL) 100 MG 24 hr tablet Take 1 tablet (100 mg total) by mouth daily. Take with or immediately following a meal. 30 tablet  1 09/18/2024 at  7:55 AM   rosuvastatin  (CRESTOR ) 40 MG tablet Take 1 tablet (40 mg total) by mouth daily. 90 tablet 3 09/18/2024 Morning   tamsulosin  (FLOMAX ) 0.4 MG CAPS capsule Take 2 capsules (0.8 mg total) by mouth daily. 180 capsule 3 09/18/2024 Morning   warfarin (COUMADIN ) 5 MG tablet Take 0.5-1 tablets (2.5-5 mg total) by mouth See admin instructions. Take 2.5 mg by mouth Tuesday and Friday and take 5 mg on Monday, Wednesday, Thursday, Saturday and Sunday (Patient taking differently: Take 2.5-5 mg by mouth See admin instructions. TAD per [facility]/Dose changed on 09/13/2024)   09/17/2024 at  5:00 PM   HYDROcodone  bit-homatropine (HYCODAN) 5-1.5 MG/5ML syrup Take 5  mLs by mouth every 6 (six) hours as needed for cough. (Patient not taking: Reported on 09/18/2024) 120 mL 0 Not Taking   hydrOXYzine  (ATARAX ) 25 MG tablet Take 1 tablet (25 mg total) by mouth every 8 (eight) hours as needed for itching. (Patient not taking: Reported on 09/18/2024) 30 tablet 5 Not Taking   OXYGEN  Inhale 2 L into the lungs continuous. Hx of lung ca, copd, and emphysema      Palonosetron  HCl (ALOXI  IV) Inject into the vein.       Assessment: Pharmacy consulted to dose warfarin in patient with atrial fibrillation.  Patient's home dose listed as 5 mg on Mon + Fri and 2.5 mg ROW.  INR on admission is therapeutic at 2.6.  INR 2.6 > 3.5>3.7 Hgb 9.6  Goal of Therapy:  INR 2-3 Monitor platelets by anticoagulation protocol: Yes   Plan:  Hold warfarin x 1 dose Monitor daily INR and s/s of bleeding  Elspeth Sour, PharmD Clinical Pharmacist 09/20/2024 8:17 AM         [1] No Active Allergies

## 2024-09-20 NOTE — Assessment & Plan Note (Signed)
-   In the setting of COPD exacerbation and bronchiectasis/multifocal pneumonia - Continue treatment with IV antibiotics, steroids, nebulizer management, mucolytic's, flutter valve and supportive care - Oxygen  supplementation essentially back to his baseline at the moment; no using accessory muscles. - Planning to transition treatment to oral route in the next 24 hours and discharged home with outpatient follow-up.

## 2024-09-20 NOTE — Assessment & Plan Note (Signed)
-   No history of rupture - Continue outpatient monitoring/follow-up. - Vital signs are stable; patient reports no abdominal pain.

## 2024-09-20 NOTE — Assessment & Plan Note (Signed)
Continue statin 

## 2024-09-20 NOTE — Assessment & Plan Note (Signed)
-   Patient with underlying history of chronic diastolic heart failure - Continue IV diuresis for mild exacerbation - TED hoses have been ordered. - Follow daily weights and strict intake and output.

## 2024-09-21 ENCOUNTER — Inpatient Hospital Stay (HOSPITAL_COMMUNITY)

## 2024-09-21 DIAGNOSIS — R7989 Other specified abnormal findings of blood chemistry: Secondary | ICD-10-CM | POA: Diagnosis not present

## 2024-09-21 DIAGNOSIS — K219 Gastro-esophageal reflux disease without esophagitis: Secondary | ICD-10-CM | POA: Diagnosis not present

## 2024-09-21 DIAGNOSIS — E1159 Type 2 diabetes mellitus with other circulatory complications: Secondary | ICD-10-CM | POA: Diagnosis not present

## 2024-09-21 DIAGNOSIS — E782 Mixed hyperlipidemia: Secondary | ICD-10-CM | POA: Diagnosis not present

## 2024-09-21 DIAGNOSIS — I639 Cerebral infarction, unspecified: Secondary | ICD-10-CM | POA: Diagnosis not present

## 2024-09-21 DIAGNOSIS — I10 Essential (primary) hypertension: Secondary | ICD-10-CM | POA: Diagnosis not present

## 2024-09-21 DIAGNOSIS — C3490 Malignant neoplasm of unspecified part of unspecified bronchus or lung: Secondary | ICD-10-CM | POA: Diagnosis not present

## 2024-09-21 DIAGNOSIS — J441 Chronic obstructive pulmonary disease with (acute) exacerbation: Secondary | ICD-10-CM | POA: Diagnosis not present

## 2024-09-21 DIAGNOSIS — N400 Enlarged prostate without lower urinary tract symptoms: Secondary | ICD-10-CM | POA: Diagnosis not present

## 2024-09-21 DIAGNOSIS — J9621 Acute and chronic respiratory failure with hypoxia: Secondary | ICD-10-CM | POA: Diagnosis not present

## 2024-09-21 DIAGNOSIS — I7143 Infrarenal abdominal aortic aneurysm, without rupture: Secondary | ICD-10-CM | POA: Diagnosis not present

## 2024-09-21 DIAGNOSIS — I480 Paroxysmal atrial fibrillation: Secondary | ICD-10-CM | POA: Diagnosis not present

## 2024-09-21 LAB — PROTIME-INR
INR: 3.7 — ABNORMAL HIGH (ref 0.8–1.2)
Prothrombin Time: 38.7 s — ABNORMAL HIGH (ref 11.4–15.2)

## 2024-09-21 LAB — GLUCOSE, CAPILLARY
Glucose-Capillary: 157 mg/dL — ABNORMAL HIGH (ref 70–99)
Glucose-Capillary: 140 mg/dL — ABNORMAL HIGH (ref 70–99)
Glucose-Capillary: 206 mg/dL — ABNORMAL HIGH (ref 70–99)

## 2024-09-21 MED ORDER — ENSURE PLUS HIGH PROTEIN PO LIQD
237.0000 mL | Freq: Two times a day (BID) | ORAL | Status: DC
  Administered 2024-09-22 – 2024-09-27 (×8): 237 mL via ORAL

## 2024-09-21 MED ORDER — FUROSEMIDE 10 MG/ML IJ SOLN
40.0000 mg | Freq: Two times a day (BID) | INTRAMUSCULAR | Status: DC
  Administered 2024-09-21 – 2024-09-22 (×2): 40 mg via INTRAVENOUS
  Filled 2024-09-21 (×2): qty 4

## 2024-09-21 MED ORDER — FUROSEMIDE 10 MG/ML IJ SOLN
20.0000 mg | Freq: Two times a day (BID) | INTRAMUSCULAR | Status: DC
  Administered 2024-09-21: 20 mg via INTRAVENOUS
  Filled 2024-09-21: qty 2

## 2024-09-21 NOTE — Assessment & Plan Note (Signed)
-   Paroxysmal atrial fibrillation - Continue treatment with Cardizem  and metoprolol  - Continue Coumadin  for secondary prevention. - Appreciate assistance by pharmacy adjusting Coumadin  dose.

## 2024-09-21 NOTE — Assessment & Plan Note (Signed)
-   Continue current bronchodilator management, steroids, mucolytic's, flutter valve and wean off oxygen  supplementation as tolerated - Continue antibiotics as mentioned above.

## 2024-09-21 NOTE — Assessment & Plan Note (Signed)
-   Most recent A1c 5.4 -Following diet control; off any hypoglycemic agents. - Continue to follow CBG fluctuation.  For now blood sugar has remained below 200 and not needing any coverage.

## 2024-09-21 NOTE — Assessment & Plan Note (Signed)
 Patient will continue tums Ultra

## 2024-09-21 NOTE — Assessment & Plan Note (Signed)
-   Patient with history of CVA - No complaints of new focal deficit appreciated - Continue Coumadin  for secondary prevention.

## 2024-09-21 NOTE — Assessment & Plan Note (Signed)
-   Apparently experiencing recurrence at the moment. - Continue outpatient follow-up with oncology service.

## 2024-09-21 NOTE — Assessment & Plan Note (Signed)
-   Continue Flomax . - Patient reports no complaints of urinary retention.

## 2024-09-21 NOTE — Assessment & Plan Note (Signed)
-   Continue current antihypertensive agents and follow vital signs.

## 2024-09-21 NOTE — Assessment & Plan Note (Signed)
-   No suicidal ideation or hallucination - Will continue the use of BuSpar  and also continue Atarax . - Patient has responded tremendously to the use of BuSpar  and as needed Atarax  - Reports to be less itchy and significantly less anxious.

## 2024-09-21 NOTE — Assessment & Plan Note (Signed)
-   No history of rupture - Continue outpatient monitoring/follow-up. - Vital signs are stable; patient reports no abdominal pain.

## 2024-09-21 NOTE — Progress Notes (Signed)
 OT Cancellation Note  Patient Details Name: Randall Sullivan MRN: [id number] DOB: 28-May-1951   Cancelled Treatment:    Reason Eval/Treat Not Completed: Fatigue/lethargy limiting ability to participate;Other (comment). Pt sleeping at time of attempted evaluation. Wife reported she would prefer to try the evaluation tomorrow. Will attempt evaluation later as time permits.   Modena Bellemare OT, MOT   Jayson Person 09/21/2024, 3:38 PM

## 2024-09-21 NOTE — Assessment & Plan Note (Signed)
Continue statin 

## 2024-09-21 NOTE — Plan of Care (Signed)

## 2024-09-21 NOTE — Progress Notes (Signed)
 " PROGRESS NOTE    Randall Sullivan  FMW:[id number] DOB: [birth year] DOA: 09/18/2024  PCP: Randall Suzzane POUR, MD    No chief complaint on file.   Brief Narrative:  Randall Sullivan is a 73 y.o. male with medical history significant of non-small cell lung cancer, COPD, chronic respiratory failure on 2-3 L supplementation, paroxysmal atrial fibrillation (on Coumadin ), AAA, hypertension, hyperlipidemia, prediabetes, BPH, and prior history of CVA; who presented to the hospital secondary to shortness of breath and worsening mildly productive coughing spells.  Patient reports that for the last 48 hours at home he has noticed increased shortness of breath and hypoxia despite high home oxygen  supplementation; nebulizer treatments have failed to improve his symptoms.   Patient expressed no fever, no chills, no sick contacts, no hemoptysis, no nausea, no vomiting, no chest pain, no hematuria/dysuria or any focal weaknesses.   Workup in the ED demonstrating the need of 6 L supplementation to maintain saturation above 90-92%; chest x-ray with multifocal pneumonia.   Patient respiratory panel by PCR negative for COVID, influenza and RSV.  Cultures taken, IV antibiotics has been started, nebulizer management and steroids provided.  TRH consulted to place patient in the hospital for further evaluation and management.   Assessment & Plan: Assessment & Plan Acute on chronic respiratory failure with hypoxia (HCC) - In the setting of COPD exacerbation and bronchiectasis/multifocal pneumonia - Continue treatment with IV antibiotics, steroids, nebulizer management, mucolytic's, flutter valve and supportive care - Oxygen  supplementation essentially back to his baseline at the moment; no using accessory muscles. - Planning to transition treatment to oral route in the next 24-48 hours and discharged home with home health services.  AAA (abdominal aortic aneurysm) - No history of rupture - Continue outpatient  monitoring/follow-up. - Vital signs are stable; patient reports no abdominal pain.  Atrial fibrillation (HCC) - Paroxysmal atrial fibrillation - Continue treatment with Cardizem  and metoprolol  - Continue Coumadin  for secondary prevention. - Appreciate assistance by pharmacy adjusting Coumadin  dose.  COPD with acute exacerbation (HCC) - Continue current bronchodilator management, steroids, mucolytic's, flutter valve and wean off oxygen  supplementation as tolerated - Continue antibiotics as mentioned above.  Anxiety and depression - No suicidal ideation or hallucination - Will continue the use of BuSpar  and also continue Atarax . - Patient has responded tremendously to the use of BuSpar  and as needed Atarax  - Reports to be less itchy and significantly less anxious.  GERD (gastroesophageal reflux disease) - Continue PPI.  Essential hypertension - Continue current antihypertensive agents and follow vital signs.  Mixed hyperlipidemia - Continue statin. - Heart healthy diet discussed with patient.  BPH (benign prostatic hyperplasia) - Continue Flomax . - Patient reports no complaints of urinary retention.  Type 2 diabetes mellitus (HCC) - Most recent A1c 5.4 -Following diet control; off any hypoglycemic agents. - Continue to follow CBG fluctuation.  For now blood sugar has remained below 200 and not needing any coverage.  Elevated brain natriuretic peptide (BNP) level - Patient with underlying history of chronic diastolic heart failure -Most likely having component of acute on chronic heart failure exacerbation. -Will increase IV Lasix  - Continue to follow daily weights and strict intake and output - Repeat chest x-ray 8 - Follow clinical response. - Low-sodium diet discussed with patient.  Small cell lung cancer (HCC) - Apparently experiencing recurrence at the moment. - Continue outpatient follow-up with oncology service.  CVA (cerebral vascular accident) [facility]) - Patient  with history of CVA - No complaints of new focal deficit  appreciated - Continue Coumadin  for secondary prevention.      DVT prophylaxis: Chronically on Coumadin . Code Status: Full code. Family Communication: No family at bedside. Disposition:   Status is: Inpatient Remains inpatient appropriate because: Continue IV therapy.   Consultants:  None  Procedures:  See below for x-ray reports.  Antimicrobials:  Rocephin  and Zithromax .  Subjective: Patient expressed not feeling too good today; some difficulty sleeping at night with associated shortness of breath while lying down.  While participating with physical therapy was requiring 4-5 L nasal cannula supplementation to maintain saturation.  Physical exam with positive decreased breath sounds at the bases and fine crackles.  Objective: Vitals:   09/21/24 0709 09/21/24 0935 09/21/24 0942 09/21/24 1212  BP:  (!) 140/101 (!) 137/101 129/87  Pulse:  80  94  Resp:  18    Temp:  98.1 F (36.7 C)  97.8 F (36.6 C)  TempSrc:  Oral  Oral  SpO2: 98% 97%  98%  Weight:      Height:       No intake or output data in the 24 hours ending 09/21/24 1253  Filed Weights   09/19/24 0520 09/20/24 0606 09/21/24 0300  Weight: 94.6 kg 94.8 kg 92.7 kg    Examination: General exam: Alert, awake, following commands appropriately and expressing no feeling good.  Patient expressed difficulty speaking at night and having some orthopnea. Respiratory system: 4-5 L nasal cannula supplementation to maintain saturation while doing physical activity.  Decreased breath sounds appreciated on exam.  Positive rhonchi.  Very little expiratory wheezing on exam.  No using accessory muscles. Cardiovascular system: Rate controlled, no rubs, no gallops, no JVD. Gastrointestinal system: Abdomen is nondistended, soft and nontender. No organomegaly or masses felt. Normal bowel sounds heard. Central nervous system: Moving 4 limbs spontaneously.  No focal  neurological deficits. Extremities: No cyanosis or clubbing; trace to 1+ pedal edema appreciated bilaterally. Skin: No petechiae. Psychiatry: Judgement and insight appear normal.  Flat affect appreciated on exam.  Data Reviewed: I have personally reviewed following labs and imaging studies  CBC: Recent Labs  Lab 09/18/24 0922 09/19/24 0354  WBC 8.4 8.3  NEUTROABS 6.5  --   HGB 10.4* 9.6*  HCT 36.4* 33.4*  MCV 81.8 81.1  PLT 195 189    Basic Metabolic Panel: Recent Labs  Lab 09/18/24 0922 09/18/24 1204 09/19/24 0354  NA 144  --  144  K 4.3  --  4.4  CL 103  --  102  CO2 38*  --  37*  GLUCOSE 104*  --  164*  BUN 16  --  22  CREATININE 0.89  --  0.87  CALCIUM  9.4  --  9.2  MG  --  1.9  --   PHOS  --  2.5  --     GFR: Estimated Creatinine Clearance: 85.2 mL/min (by C-G formula based on SCr of 0.87 mg/dL).  Liver Function Tests: Recent Labs  Lab 09/18/24 0922  AST 20  ALT 16  ALKPHOS 48  BILITOT 0.8  PROT 6.7  ALBUMIN  4.0    CBG: Recent Labs  Lab 09/20/24 1105 09/20/24 1609 09/20/24 2113 09/21/24 0723 09/21/24 1118  GLUCAP 212* 154* 228* 157* 140*    Recent Results (from the past 240 hours)  Culture, blood (Routine x 2)     Status: None (Preliminary result)   Collection Time: 09/18/24  9:22 AM   Specimen: BLOOD RIGHT ARM  Result Value Ref Range Status   Specimen Description  Final    BLOOD RIGHT ARM BOTTLES DRAWN AEROBIC AND ANAEROBIC   Special Requests Blood Culture adequate volume  Final   Culture   Final    NO GROWTH 3 DAYS Performed at [facility], [address] [place], [place] [address]    Report Status PENDING  Incomplete  Culture, blood (Routine x 2)     Status: None (Preliminary result)   Collection Time: 09/18/24  9:22 AM   Specimen: BLOOD RIGHT ARM  Result Value Ref Range Status   Specimen Description   Final    BLOOD RIGHT ARM BOTTLES DRAWN AEROBIC AND ANAEROBIC   Special Requests Blood Culture adequate volume  Final    Culture   Final    NO GROWTH 3 DAYS Performed at [facility], [address], [place], [place] [address]    Report Status PENDING  Incomplete  Resp panel by RT-PCR (RSV, Flu A&B, Covid) Anterior Nasal Swab     Status: None   Collection Time: 09/18/24  9:22 AM   Specimen: Anterior Nasal Swab  Result Value Ref Range Status   SARS Coronavirus 2 by RT PCR NEGATIVE NEGATIVE Final    Comment: (NOTE) SARS-CoV-2 target nucleic acids are NOT DETECTED.  The SARS-CoV-2 RNA is generally detectable in upper respiratory specimens during the acute phase of infection. The lowest concentration of SARS-CoV-2 viral copies this assay can detect is 138 copies/mL. A negative result does not preclude SARS-Cov-2 infection and should not be used as the sole basis for treatment or other patient management decisions. A negative result may occur with  improper specimen collection/handling, submission of specimen other than nasopharyngeal swab, presence of viral mutation(s) within the areas targeted by this assay, and inadequate number of viral copies(<138 copies/mL). A negative result must be combined with clinical observations, patient history, and epidemiological information. The expected result is Negative.  Fact Sheet for Patients:  bloggercourse.com  Fact Sheet for Healthcare Providers:  seriousbroker.it  This test is no t yet approved or cleared by the [place]  FDA and  has been authorized for detection and/or diagnosis of SARS-CoV-2 by FDA under an Emergency Use Authorization (EUA). This EUA will remain  in effect (meaning this test can be used) for the duration of the COVID-19 declaration under Section 564(b)(1) of the Act, 21 U.S.C.section 360bbb-3(b)(1), unless the authorization is terminated  or revoked sooner.       Influenza A by PCR NEGATIVE NEGATIVE Final   Influenza B by PCR NEGATIVE NEGATIVE Final    Comment: (NOTE) The Xpert  Xpress SARS-CoV-2/FLU/RSV plus assay is intended as an aid in the diagnosis of influenza from Nasopharyngeal swab specimens and should not be used as a sole basis for treatment. Nasal washings and aspirates are unacceptable for Xpert Xpress SARS-CoV-2/FLU/RSV testing.  Fact Sheet for Patients: bloggercourse.com  Fact Sheet for Healthcare Providers: seriousbroker.it  This test is not yet approved or cleared by the [place]  FDA and has been authorized for detection and/or diagnosis of SARS-CoV-2 by FDA under an Emergency Use Authorization (EUA). This EUA will remain in effect (meaning this test can be used) for the duration of the COVID-19 declaration under Section 564(b)(1) of the Act, 21 U.S.C. section 360bbb-3(b)(1), unless the authorization is terminated or revoked.     Resp Syncytial Virus by PCR NEGATIVE NEGATIVE Final    Comment: (NOTE) Fact Sheet for Patients: bloggercourse.com  Fact Sheet for Healthcare Providers: seriousbroker.it  This test is not yet approved or cleared by the [place]  FDA  and has been authorized for detection and/or diagnosis of SARS-CoV-2 by FDA under an Emergency Use Authorization (EUA). This EUA will remain in effect (meaning this test can be used) for the duration of the COVID-19 declaration under Section 564(b)(1) of the Act, 21 U.S.C. section 360bbb-3(b)(1), unless the authorization is terminated or revoked.  Performed at [facility], [address], [place], [place] [address]   MRSA Next Gen by PCR, Nasal     Status: None   Collection Time: 09/18/24  5:00 PM   Specimen: Nasal Mucosa; Nasal Swab  Result Value Ref Range Status   MRSA by PCR Next Gen NOT DETECTED NOT DETECTED Final    Comment: (NOTE) The GeneXpert MRSA Assay (FDA approved for NASAL specimens only), is one component of a comprehensive MRSA colonization  surveillance program. It is not intended to diagnose MRSA infection nor to guide or monitor treatment for MRSA infections. Test performance is not FDA approved in patients less than 15 years old. Performed at [facility], [address], [place], [place] [address]     Radiology Studies: No results found.  Scheduled Meds:  arformoterol   15 mcg Nebulization BID   azithromycin   250 mg Oral Daily   budesonide  (PULMICORT ) nebulizer solution  0.5 mg Nebulization BID   busPIRone   5 mg Oral TID   Chlorhexidine  Gluconate Cloth  6 each Topical Q0600   dextromethorphan -guaiFENesin   1 tablet Oral BID   diltiazem   120 mg Oral Daily   furosemide   40 mg Intravenous Q12H   ipratropium-albuterol   3 mL Nebulization TID   loratadine   10 mg Oral Daily   methylPREDNISolone  (SOLU-MEDROL ) injection  40 mg Intravenous Q12H   metoprolol  succinate  100 mg Oral Daily   pantoprazole   40 mg Oral BID   rosuvastatin   40 mg Oral Daily   tamsulosin   0.8 mg Oral Daily   Warfarin - Pharmacist Dosing Inpatient   Does not apply q1600   Continuous Infusions:  cefTRIAXone  (ROCEPHIN )  IV 1 g (09/21/24 0948)     LOS: 3 days    Time spent: 50 minutes    Eric Nunnery, MD [facility]   To contact the attending provider between 7A-7P or the covering provider during after hours 7P-7A, please log into the web site [email or web address] and access using universal [place] password for that web site. If you do not have the password, please call the hospital operator.  09/21/2024, 12:53 PM    "

## 2024-09-21 NOTE — Assessment & Plan Note (Signed)
-   Patient with underlying history of chronic diastolic heart failure -Most likely having component of acute on chronic heart failure exacerbation. -Will increase IV Lasix  - Continue to follow daily weights and strict intake and output - Repeat chest x-ray 8 - Follow clinical response. - Low-sodium diet discussed with patient.

## 2024-09-21 NOTE — Progress Notes (Signed)
 PHARMACY - ANTICOAGULATION CONSULT NOTE  Pharmacy Consult for warfarin Indication: atrial fibrillation  Allergies[1]  Patient Measurements: Height: 5' 10 (177.8 cm) Weight: 92.7 kg (204 lb 5.9 oz) IBW/kg (Calculated) : 73 HEPARIN  DW (KG): 91.9  Vital Signs: Temp: 98 F (36.7 C) (12/23 0300) Temp Source: Oral (12/23 0300) BP: 136/98 (12/23 0300) Pulse Rate: 90 (12/23 0300)  Labs: Recent Labs    09/18/24 0922 09/19/24 0354 09/20/24 0528 09/21/24 0428  HGB 10.4* 9.6*  --   --   HCT 36.4* 33.4*  --   --   PLT 195 189  --   --   LABPROT 29.0* 36.7* 38.3* 38.7*  INR 2.6* 3.5* 3.7* 3.7*  CREATININE 0.89 0.87  --   --     Estimated Creatinine Clearance: 85.2 mL/min (by C-G formula based on SCr of 0.87 mg/dL).   Medical History: Past Medical History:  Diagnosis Date   A-fib (HCC) 2017   after last lung surgery patient had a history if Afib documented in hospital    AAA (abdominal aortic aneurysm)    4.0 cm 09/17/22 US    Acute appendicitis 07/14/2021   Anxiety and depression 09/18/2024   Arthritis    back & legs ( knees)   Cancer (HCC)    lung   Complication of anesthesia    agitated upon waking from anesth.    Dyspnea    Dysrhythmia    A. Fib   Emphysema    Emphysema of lung (HCC)    GERD (gastroesophageal reflux disease) 09/18/2024   History of kidney stones    Hyperlipidemia    Hypertension    Oxygen  dependent    2L continuous   Peripheral vascular disease    Aortic Aneurysm   Pneumonia    Pre-diabetes    Sleep apnea    No OSA, but wears 2L [place] O2 continuous including at night   Stroke [facility]) 2022   Right sided weakness, decreased hearing, balance and memory loss   Tobacco abuse     Medications:  Medications Prior to Admission  Medication Sig Dispense Refill Last Dose/Taking   albuterol  (VENTOLIN  HFA) 108 (90 Base) MCG/ACT inhaler Inhale 2 puffs into the lungs every 4 (four) hours as needed for wheezing or shortness of breath. 8 g 2 09/18/2024  Morning   budesonide -glycopyrrolate -formoterol  (BREZTRI  AEROSPHERE) 160-9-4.8 MCG/ACT AERO inhaler Inhale 2 puffs into the lungs 2 (two) times daily.   09/18/2024 Morning   cetirizine  (ZYRTEC ) 10 MG tablet Take 10 mg by mouth daily.   09/18/2024 Morning   diltiazem  (CARDIZEM  CD) 120 MG 24 hr capsule Take 1 capsule (120 mg total) by mouth daily. 30 capsule 1 09/18/2024 Morning   fenofibrate  (TRICOR ) 145 MG tablet Take 1 tablet (145 mg total) by mouth daily. 90 tablet 1 09/18/2024 Morning   finasteride  (PROSCAR ) 5 MG tablet Take 1 tablet by mouth once daily 30 tablet 0 09/18/2024 Morning   guaiFENesin -dextromethorphan  (ROBITUSSIN DM) 100-10 MG/5ML syrup Take 10 mLs by mouth every 8 (eight) hours as needed for cough.   09/18/2024 Morning   ipratropium-albuterol  (DUONEB) 0.5-2.5 (3) MG/3ML SOLN Take 3 mLs by nebulization every 6 (six) hours as needed. (Patient taking differently: Take 3 mLs by nebulization every 6 (six) hours.) 360 mL 1 09/18/2024 Morning   magnesium  oxide (MAG-OX) 400 (240 Mg) MG tablet Take 1 tablet (400 mg total) by mouth 2 (two) times daily. 60 tablet 5 09/18/2024 Morning   metoprolol  succinate (TOPROL  XL) 100 MG 24 hr tablet Take 1  tablet (100 mg total) by mouth daily. Take with or immediately following a meal. 30 tablet 1 09/18/2024 at  7:55 AM   rosuvastatin  (CRESTOR ) 40 MG tablet Take 1 tablet (40 mg total) by mouth daily. 90 tablet 3 09/18/2024 Morning   tamsulosin  (FLOMAX ) 0.4 MG CAPS capsule Take 2 capsules (0.8 mg total) by mouth daily. 180 capsule 3 09/18/2024 Morning   warfarin (COUMADIN ) 5 MG tablet Take 0.5-1 tablets (2.5-5 mg total) by mouth See admin instructions. Take 2.5 mg by mouth Tuesday and Friday and take 5 mg on Monday, Wednesday, Thursday, Saturday and Sunday (Patient taking differently: Take 2.5-5 mg by mouth See admin instructions. TAD per [facility]/Dose changed on 09/13/2024)   09/17/2024 at  5:00 PM   HYDROcodone  bit-homatropine (HYCODAN) 5-1.5 MG/5ML  syrup Take 5 mLs by mouth every 6 (six) hours as needed for cough. (Patient not taking: Reported on 09/18/2024) 120 mL 0 Not Taking   hydrOXYzine  (ATARAX ) 25 MG tablet Take 1 tablet (25 mg total) by mouth every 8 (eight) hours as needed for itching. (Patient not taking: Reported on 09/18/2024) 30 tablet 5 Not Taking   OXYGEN  Inhale 2 L into the lungs continuous. Hx of lung ca, copd, and emphysema      Palonosetron  HCl (ALOXI  IV) Inject into the vein.       Assessment: Pharmacy consulted to dose warfarin in patient with atrial fibrillation.  Patient's home dose listed as 5 mg on Mon + Fri and 2.5 mg ROW.  INR on admission is therapeutic at 2.6.  INR 2.6 > 3.5>3.7> 3.7, supratherapeutic Hgb 9.6  Goal of Therapy:  INR 2-3 Monitor platelets by anticoagulation protocol: Yes   Plan:  Hold warfarin x 1 dose Monitor daily INR and s/s of bleeding  Cherlyn Boers, BS Pharm D, BCPS Clinical Pharmacist 09/21/2024 8:33 AM          [1] No Active Allergies

## 2024-09-21 NOTE — Plan of Care (Signed)
" °  Problem: Acute Rehab PT Goals(only PT should resolve) Goal: Pt Will Go Supine/Side To Sit Outcome: Progressing Flowsheets (Taken 09/21/2024 1501) Pt will go Supine/Side to Sit:  Independently  with modified independence Goal: Patient Will Transfer Sit To/From Stand Outcome: Progressing Flowsheets (Taken 09/21/2024 1501) Patient will transfer sit to/from stand:  with modified independence  with supervision Goal: Pt Will Transfer Bed To Chair/Chair To Bed Outcome: Progressing Flowsheets (Taken 09/21/2024 1501) Pt will Transfer Bed to Chair/Chair to Bed:  with modified independence  with supervision Goal: Pt Will Ambulate Outcome: Progressing Flowsheets (Taken 09/21/2024 1501) Pt will Ambulate:  50 feet  with modified independence  with supervision  with rolling walker  with least restrictive assistive device   3:01 PM, 09/21/2024 Lynwood Music, MPT Physical Therapist with [facility] [phone] office [phone] mobile phone  "

## 2024-09-21 NOTE — Evaluation (Signed)
 Physical Therapy Evaluation Patient Details Name: Randall Sullivan MRN: [id number] DOB: [birth year] Today's Date: 09/21/2024  History of Present Illness  Randall Sullivan is a 73 y.o. male with medical history significant of non-small cell lung cancer, COPD, chronic respiratory failure on 2-3 L supplementation, paroxysmal atrial fibrillation (on Coumadin ), AAA, hypertension, hyperlipidemia, prediabetes, BPH, and prior history of CVA; who presented to the hospital secondary to shortness of breath and worsening mildly productive coughing spells.  Patient reports that for the last 48 hours at home he has noticed increased shortness of breath and hypoxia despite high home oxygen  supplementation; nebulizer treatments have failed to improve his symptoms.     Patient expressed no fever, no chills, no sick contacts, no hemoptysis, no nausea, no vomiting, no chest pain, no hematuria/dysuria or any focal weaknesses.     Workup in the ED demonstrating the need of 6 L supplementation to maintain saturation above 90-92%; chest x-ray with multifocal pneumonia.   Clinical Impression  Patient demonstrates good return for sitting up at bedside with [facility] partially raised, unsteady on feet pushing IV pole ambulating in room without loss of balance and limited mostly due to c/o SOB with SpO2 dropping from 95% to 68% while on 2 LPM O2. Patient tolerated sitting up in chair after therapy and SpO2 maintaining at 93% while on 5 LPM O2 - RN notified. Patient will benefit from continued skilled physical therapy in hospital and recommended venue below to increase strength, balance, endurance for safe ADLs and gait.          If plan is discharge home, recommend the following: A little help with walking and/or transfers;A little help with bathing/dressing/bathroom;Help with stairs or ramp for entrance;Assist for transportation;Assistance with cooking/housework   Can travel by private vehicle        Equipment Recommendations None  recommended by PT  Recommendations for Other Services       Functional Status Assessment Patient has had a recent decline in their functional status and demonstrates the ability to make significant improvements in function in a reasonable and predictable amount of time.     Precautions / Restrictions Precautions Precautions: Fall Recall of Precautions/Restrictions: Intact Restrictions Weight Bearing Restrictions Per Provider Order: No      Mobility  Bed Mobility Overal bed mobility: Modified Independent             General bed mobility comments: HOB partially raised    Transfers Overall transfer level: Needs assistance Equipment used: 1 person hand held assist (IV pole) Transfers: Sit to/from Stand, Bed to chair/wheelchair/BSC Sit to Stand: Contact guard assist   Step pivot transfers: Contact guard assist       General transfer comment: slow labored movement    Ambulation/Gait Ambulation/Gait assistance: Contact guard assist, Min assist Gait Distance (Feet): 15 Feet Assistive device: IV Pole, 1 person hand held assist Gait Pattern/deviations: Decreased step length - right, Decreased step length - left, Decreased stride length Gait velocity: decreased     General Gait Details: slow labored movement using IV pole without loss of balance, limited mostly due to c/o SOB with SpO2 dropping from 95% to 68% while on 2 LPM O2  Stairs            Wheelchair Mobility     Tilt Bed    Modified Rankin (Stroke Patients Only)       Balance Overall balance assessment: Needs assistance Sitting-balance support: Feet supported, No upper extremity supported Sitting balance-Leahy Scale: Good Sitting balance -  Comments: seated at EOB   Standing balance support: During functional activity, Single extremity supported Standing balance-Leahy Scale: Fair Standing balance comment: fair/poor using IV pole                             Pertinent Vitals/Pain  Pain Assessment Pain Assessment: No/denies pain    Home Living Family/patient expects to be discharged to:: Private residence Living Arrangements: Spouse/significant other Available Help at Discharge: Family;Available PRN/intermittently Type of Home: House Home Access: Stairs to enter Entrance Stairs-Rails: Left;Right;Can reach both Entrance Stairs-Number of Steps: 4   Home Layout: One level Home Equipment: Cane - single point;Shower Counsellor (2 wheels);Grab bars - tub/shower      Prior Function Prior Level of Function : Needs assist       Physical Assist : ADLs (physical);Mobility (physical)     Mobility Comments: Ambulates without AD inside and uses SPC when outside. Uses 2 LPM supplemental O2 at baseline. ADLs Comments: Independent ADL; assist IADL's.     Extremity/Trunk Assessment   Upper Extremity Assessment Upper Extremity Assessment: Overall WFL for tasks assessed    Lower Extremity Assessment Lower Extremity Assessment: Overall WFL for tasks assessed    Cervical / Trunk Assessment Cervical / Trunk Assessment: Normal  Communication   Communication Communication: No apparent difficulties    Cognition Arousal: Alert Behavior During Therapy: WFL for tasks assessed/performed                             Following commands: Intact       Cueing Cueing Techniques: Verbal cues, Tactile cues     General Comments      Exercises     Assessment/Plan    PT Assessment Patient needs continued PT services  PT Problem List Decreased strength;Decreased activity tolerance;Decreased balance;Decreased mobility;Cardiopulmonary status limiting activity       PT Treatment Interventions DME instruction;Gait training;Stair training;Functional mobility training;Therapeutic activities;Therapeutic exercise;Balance training;Patient/family education    PT Goals (Current goals can be found in the Care Plan section)  Acute Rehab PT Goals Patient  Stated Goal: return home with family to assist PT Goal Formulation: With patient Time For Goal Achievement: 09/25/24 Potential to Achieve Goals: Good    Frequency Min 3X/week     Co-evaluation               AM-PAC PT 6 Clicks Mobility  Outcome Measure Help needed turning from your back to your side while in a flat bed without using bedrails?: None Help needed moving from lying on your back to sitting on the side of a flat bed without using bedrails?: A Little Help needed moving to and from a bed to a chair (including a wheelchair)?: A Little Help needed standing up from a chair using your arms (e.g., wheelchair or bedside chair)?: A Little Help needed to walk in hospital room?: A Lot Help needed climbing 3-5 steps with a railing? : A Lot 6 Click Score: 17    End of Session Equipment Utilized During Treatment: Oxygen  Activity Tolerance: Patient tolerated treatment well;Patient limited by fatigue Patient left: in chair;with call bell/phone within reach Nurse Communication: Mobility status PT Visit Diagnosis: Unsteadiness on feet (R26.81);Other abnormalities of gait and mobility (R26.89);Muscle weakness (generalized) (M62.81)    Time: 8870-8843 PT Time Calculation (min) (ACUTE ONLY): 27 min   Charges:   PT Evaluation $PT Eval Moderate Complexity: 1 Mod PT Treatments $Therapeutic  Activity: 23-37 mins PT General Charges $$ ACUTE PT VISIT: 1 Visit         3:00 PM, 09/21/2024 Lynwood Music, MPT Physical Therapist with [facility] [phone] office [phone] mobile phone

## 2024-09-21 NOTE — Assessment & Plan Note (Signed)
-   In the setting of COPD exacerbation and bronchiectasis/multifocal pneumonia - Continue treatment with IV antibiotics, steroids, nebulizer management, mucolytic's, flutter valve and supportive care - Oxygen  supplementation essentially back to his baseline at the moment; no using accessory muscles. - Planning to transition treatment to oral route in the next 24-48 hours and discharged home with home health services.

## 2024-09-22 DIAGNOSIS — J9621 Acute and chronic respiratory failure with hypoxia: Secondary | ICD-10-CM | POA: Diagnosis not present

## 2024-09-22 LAB — PROTIME-INR
INR: 3.2 — ABNORMAL HIGH (ref 0.8–1.2)
Prothrombin Time: 34 s — ABNORMAL HIGH (ref 11.4–15.2)

## 2024-09-22 LAB — GLUCOSE, CAPILLARY
Glucose-Capillary: 157 mg/dL — ABNORMAL HIGH (ref 70–99)
Glucose-Capillary: 167 mg/dL — ABNORMAL HIGH (ref 70–99)
Glucose-Capillary: 248 mg/dL — ABNORMAL HIGH (ref 70–99)
Glucose-Capillary: 196 mg/dL — ABNORMAL HIGH (ref 70–99)

## 2024-09-22 LAB — BASIC METABOLIC PANEL WITH GFR
Anion gap: 10 (ref 5–15)
BUN: 36 mg/dL — ABNORMAL HIGH (ref 8–23)
CO2: 36 mmol/L — ABNORMAL HIGH (ref 22–32)
Calcium: 9.2 mg/dL (ref 8.9–10.3)
Chloride: 98 mmol/L (ref 98–111)
Creatinine, Ser: 0.87 mg/dL (ref 0.61–1.24)
GFR, Estimated: >60 mL/min
Glucose, Bld: 151 mg/dL — ABNORMAL HIGH (ref 70–99)
Potassium: 4.1 mmol/L (ref 3.5–5.1)
Sodium: 144 mmol/L (ref 135–145)

## 2024-09-22 MED ORDER — PREDNISONE 20 MG PO TABS
40.0000 mg | ORAL_TABLET | Freq: Every day | ORAL | Status: DC
  Administered 2024-09-22 – 2024-09-23 (×2): 40 mg via ORAL
  Filled 2024-09-22 (×2): qty 2

## 2024-09-22 MED ORDER — FUROSEMIDE 40 MG PO TABS
40.0000 mg | ORAL_TABLET | Freq: Every day | ORAL | Status: DC
  Administered 2024-09-23 – 2024-09-28 (×6): 40 mg via ORAL
  Filled 2024-09-22 (×4): qty 1

## 2024-09-22 MED ORDER — FUROSEMIDE 40 MG PO TABS: 40.0000 mg | ORAL_TABLET | Freq: Every day | ORAL | Status: DC

## 2024-09-22 NOTE — Plan of Care (Signed)
" °  Problem: Clinical Measurements: Goal: Will remain free from infection Outcome: Progressing Goal: Respiratory complications will improve Outcome: Progressing   Problem: Nutrition: Goal: Adequate nutrition will be maintained Outcome: Progressing   Problem: Elimination: Goal: Will not experience complications related to urinary retention Outcome: Progressing   Problem: Education: Goal: Knowledge of General Education information will improve Description: Including pain rating scale, medication(s)/side effects and non-pharmacologic comfort measures Outcome: Not Progressing   Problem: Health Behavior/Discharge Planning: Goal: Ability to manage health-related needs will improve Outcome: Not Progressing   Problem: Clinical Measurements: Goal: Ability to maintain clinical measurements within normal limits will improve Outcome: Not Progressing   Problem: Activity: Goal: Risk for activity intolerance will decrease Outcome: Not Progressing   Problem: Coping: Goal: Level of anxiety will decrease Outcome: Not Progressing   Problem: Pain Managment: Goal: General experience of comfort will improve and/or be controlled Outcome: Not Progressing   "

## 2024-09-22 NOTE — Progress Notes (Addendum)
 " PROGRESS NOTE    Randall Sullivan  FMW:[id number] DOB: 1950/12/18 DOA: 09/18/2024 PCP: Tobie Suzzane POUR, MD   Brief Narrative:    73 y.o. male with medical history significant of non-small cell lung cancer, COPD, chronic respiratory failure on 2-3 L supplementation, paroxysmal atrial fibrillation (on Coumadin ), AAA, hypertension, hyperlipidemia, prediabetes, BPH, and prior history of CVA; who presented to the hospital secondary to shortness of breath and worsening mildly productive coughing spells.   Admitted for management of acute chronic hypoxic respiratory failure comfortably as well as COPD exacerbation.  Lives at home with his wife and uses 2 to 3 L oxygen  at baseline.  Assessment & Plan:  Principal Problem:   Acute on chronic respiratory failure with hypoxia (HCC) Active Problems:   Small cell lung cancer (HCC)   Essential hypertension   Mixed hyperlipidemia   Type 2 diabetes mellitus (HCC)   AAA (abdominal aortic aneurysm)   Atrial fibrillation (HCC)   COPD with acute exacerbation (HCC)   Anxiety and depression   GERD (gastroesophageal reflux disease)   BPH (benign prostatic hyperplasia)   Elevated brain natriuretic peptide (BNP) level   CVA (cerebral vascular accident) (HCC)   Acute on chronic hypoxic respiratory failure, POA: Needing 4 L oxygen  now.  At home, he was on 2 to 3 L oxygen .  This in the setting of COPD exacerbation as well as possible bacterial community-acquired pneumonia.  Continue with antibiotics, nebulization treatments, mucolytics, supportive care as well as flutter valve. -Ordered a 6-minute walk test to be done today  Possible bacterial community-acquired pneumonia, POA: Continue with intravenous antibiotics.  Tested negative for COVID-19, RSV and influenza panel.  Abdominal aortic aneurysm, POA: No history of rupture.  Outpatient follow-up.  Denies any abdominal pain or chest pain.  Atrial fibrillation, paroxysmal, POA: Status post cardioversion.   Continue with Cardizem  and metoprolol .  Continue with Coumadin  for stroke prophylaxis.  Continue to monitor on telemetry closely  Acute on chronic COPD exacerbation, POA: Continue with initial bronchodilator therapy, steroids, mucolytic's as well as flutter valve.  Outpatient follow-up with neurology.  Anxiety, major moderate depression, POA: Continue with BuSpar  as well as Atarax .  GERD, POA: Continue PPI  CVA, POA: No acute issues.  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  Follows up outpatient with oncology, Dr Davonna. There is a concern for immunotherapy related lung damage.   Essential hypertension, POA: Continue with home medications  Hyperlipidemia, POA: Continue with statin  Chronic diastolic CHF, POA: NYHA class III.   fluid restriction of 1.5 L/day, strict intake output monitoring and heart healthy diet. Transition to oral Lasix  40 mg daily on 09/22/2024.  Prior to that, he was on 40 mg intravenous twice daily  BPH, POA: Continue with Flomax   Diabetes mellitus type 2, POA: Continue monitor blood glucose level closely.  Most recent hemoglobin A1c is 5.4%  Disposition: Home.  Lives at home with his wife.   DVT prophylaxis: Place TED hose Start: 09/19/24 1654     Code Status: Full Code Family Communication: Wife is present at the bedside Status is: Inpatient Remains inpatient appropriate because: Acute on chronic hypoxic aspiratory failure, COPD, pneumonia    Subjective:  He still feels short of breath.  He uses 2-3 to oxygen  at baseline at home.  He, is on 4 L.  Wife is present at the bedside.  He also complaining of bilateral feet swelling.  He has a  history of atrial fibrillation status post cardiac ablation.  Denies any chest pain.  He does have history of stage III lung cancer status post chemotherapy, radiation therapy as well as  immunotherapy  Examination:  General exam: Appears to be in mild distress, nasal oxygen  in place at 4 L Respiratory system: Slightly coarse breath sounds bilaterally with occasional expiratory wheezing Cardiovascular system: S1 & S2 heard, RRR. No JVD, murmurs, rubs, gallops or clicks. No pedal edema. Gastrointestinal system: Abdomen is nondistended, soft and nontender. No organomegaly or masses felt. Normal bowel sounds heard. Central nervous system: Alert and oriented. No focal neurological deficits. Extremities: Symmetric 5 x 5 power. Skin: No rashes, lesions or ulcers Psychiatry: Judgement and insight appear normal. Mood & affect appropriate.       Diet Orders (From admission, onward)     Start     Ordered   09/18/24 1204  Diet heart healthy/carb modified Room service appropriate? Yes; Fluid consistency: Thin  Diet effective now       Question Answer Comment  Diet-HS Snack? Nothing   Room service appropriate? Yes   Fluid consistency: Thin      09/18/24 1204            Objective: Vitals:   09/22/24 0238 09/22/24 0545 09/22/24 0554 09/22/24 0803  BP:  136/84    Pulse:  85    Resp:  19    Temp:  98 F (36.7 C)    TempSrc:  Oral    SpO2: 99% 98%  98%  Weight:   92.2 kg   Height:        Intake/Output Summary (Last 24 hours) at 09/22/2024 0951 Last data filed at 09/22/2024 0854 Gross per 24 hour  Intake 120 ml  Output 2750 ml  Net -2630 ml   Filed Weights   09/20/24 0606 09/21/24 0300 09/22/24 0554  Weight: 94.8 kg 92.7 kg 92.2 kg    Scheduled Meds:  arformoterol   15 mcg Nebulization BID   azithromycin   250 mg Oral Daily   budesonide  (PULMICORT ) nebulizer solution  0.5 mg Nebulization BID   busPIRone   5 mg Oral TID   dextromethorphan -guaiFENesin   1 tablet Oral BID   diltiazem   120 mg Oral Daily   feeding supplement  237 mL Oral BID BM   furosemide   40 mg Intravenous Q12H   ipratropium-albuterol   3 mL Nebulization TID   loratadine   10 mg Oral Daily    metoprolol  succinate  100 mg Oral Daily   pantoprazole   40 mg Oral BID   predniSONE   40 mg Oral Q breakfast   rosuvastatin   40 mg Oral Daily   tamsulosin   0.8 mg Oral Daily   Warfarin - Pharmacist Dosing Inpatient   Does not apply q1600   Continuous Infusions:  cefTRIAXone  (ROCEPHIN )  IV 1 g (09/22/24 0906)    Nutritional status     Body mass index is 29.17 kg/m.  Data Reviewed:   CBC: Recent Labs  Lab 09/18/24 0922 09/19/24 0354  WBC 8.4 8.3  NEUTROABS 6.5  --   HGB 10.4* 9.6*  HCT 36.4* 33.4*  MCV 81.8 81.1  PLT 195 189   Basic Metabolic Panel: Recent Labs  Lab 09/18/24 0922 09/18/24 1204 09/19/24 0354 09/22/24 0449  NA 144  --  144 144  K 4.3  --  4.4 4.1  CL 103  --  102 98  CO2 38*  --  37* 36*  GLUCOSE 104*  --  164* 151*  BUN  16  --  22 36*  CREATININE 0.89  --  0.87 0.87  CALCIUM  9.4  --  9.2 9.2  MG  --  1.9  --   --   PHOS  --  2.5  --   --    GFR: Estimated Creatinine Clearance: 85 mL/min (by C-G formula based on SCr of 0.87 mg/dL). Liver Function Tests: Recent Labs  Lab 09/18/24 0922  AST 20  ALT 16  ALKPHOS 48  BILITOT 0.8  PROT 6.7  ALBUMIN  4.0   No results for input(s): LIPASE, AMYLASE in the last 168 hours. No results for input(s): AMMONIA in the last 168 hours. Coagulation Profile: Recent Labs  Lab 09/18/24 0922 09/19/24 0354 09/20/24 0528 09/21/24 0428 09/22/24 0449  INR 2.6* 3.5* 3.7* 3.7* 3.2*   Cardiac Enzymes: No results for input(s): CKTOTAL, CKMB, CKMBINDEX, TROPONINI in the last 168 hours. BNP (last 3 results) Recent Labs    08/02/24 0805 09/18/24 0922  PROBNP 2,402.0* 2,453.0*   HbA1C: No results for input(s): HGBA1C in the last 72 hours. CBG: Recent Labs  Lab 09/20/24 2113 09/21/24 0723 09/21/24 1118 09/21/24 2102 09/22/24 0718  GLUCAP 228* 157* 140* 206* 157*   Lipid Profile: No results for input(s): CHOL, HDL, LDLCALC, TRIG, CHOLHDL, LDLDIRECT in the last 72  hours. Thyroid  Function Tests: No results for input(s): TSH, T4TOTAL, FREET4, T3FREE, THYROIDAB in the last 72 hours. Anemia Panel: No results for input(s): VITAMINB12, FOLATE, FERRITIN, TIBC, IRON, RETICCTPCT in the last 72 hours. Sepsis Labs: Recent Labs  Lab 09/18/24 9077  LATICACIDVEN 1.1    Recent Results (from the past 240 hours)  Culture, blood (Routine x 2)     Status: None (Preliminary result)   Collection Time: 09/18/24  9:22 AM   Specimen: BLOOD RIGHT ARM  Result Value Ref Range Status   Specimen Description   Final    BLOOD RIGHT ARM BOTTLES DRAWN AEROBIC AND ANAEROBIC   Special Requests Blood Culture adequate volume  Final   Culture   Final    NO GROWTH 3 DAYS Performed at [facility], [address], [place], [place] [address]    Report Status PENDING  Incomplete  Culture, blood (Routine x 2)     Status: None (Preliminary result)   Collection Time: 09/18/24  9:22 AM   Specimen: BLOOD RIGHT ARM  Result Value Ref Range Status   Specimen Description   Final    BLOOD RIGHT ARM BOTTLES DRAWN AEROBIC AND ANAEROBIC   Special Requests Blood Culture adequate volume  Final   Culture   Final    NO GROWTH 3 DAYS Performed at [facility], [address] [place], [place] [address]    Report Status PENDING  Incomplete  Resp panel by RT-PCR (RSV, Flu A&B, Covid) Anterior Nasal Swab     Status: None   Collection Time: 09/18/24  9:22 AM   Specimen: Anterior Nasal Swab  Result Value Ref Range Status   SARS Coronavirus 2 by RT PCR NEGATIVE NEGATIVE Final    Comment: (NOTE) SARS-CoV-2 target nucleic acids are NOT DETECTED.  The SARS-CoV-2 RNA is generally detectable in upper respiratory specimens during the acute phase of infection. The lowest concentration of SARS-CoV-2 viral copies this assay can detect is 138 copies/mL. A negative result does not preclude SARS-Cov-2 infection and should not be used as the sole basis for treatment or other patient  management decisions. A negative result may occur with  improper specimen collection/handling, submission of specimen other than  nasopharyngeal swab, presence of viral mutation(s) within the areas targeted by this assay, and inadequate number of viral copies(<138 copies/mL). A negative result must be combined with clinical observations, patient history, and epidemiological information. The expected result is Negative.  Fact Sheet for Patients:  bloggercourse.com  Fact Sheet for Healthcare Providers:  seriousbroker.it  This test is no t yet approved or cleared by the [place]  FDA and  has been authorized for detection and/or diagnosis of SARS-CoV-2 by FDA under an Emergency Use Authorization (EUA). This EUA will remain  in effect (meaning this test can be used) for the duration of the COVID-19 declaration under Section 564(b)(1) of the Act, 21 U.S.C.section 360bbb-3(b)(1), unless the authorization is terminated  or revoked sooner.       Influenza A by PCR NEGATIVE NEGATIVE Final   Influenza B by PCR NEGATIVE NEGATIVE Final    Comment: (NOTE) The Xpert Xpress SARS-CoV-2/FLU/RSV plus assay is intended as an aid in the diagnosis of influenza from Nasopharyngeal swab specimens and should not be used as a sole basis for treatment. Nasal washings and aspirates are unacceptable for Xpert Xpress SARS-CoV-2/FLU/RSV testing.  Fact Sheet for Patients: bloggercourse.com  Fact Sheet for Healthcare Providers: seriousbroker.it  This test is not yet approved or cleared by the [place]  FDA and has been authorized for detection and/or diagnosis of SARS-CoV-2 by FDA under an Emergency Use Authorization (EUA). This EUA will remain in effect (meaning this test can be used) for the duration of the COVID-19 declaration under Section 564(b)(1) of the Act, 21 U.S.C. section 360bbb-3(b)(1),  unless the authorization is terminated or revoked.     Resp Syncytial Virus by PCR NEGATIVE NEGATIVE Final    Comment: (NOTE) Fact Sheet for Patients: bloggercourse.com  Fact Sheet for Healthcare Providers: seriousbroker.it  This test is not yet approved or cleared by the [place]  FDA and has been authorized for detection and/or diagnosis of SARS-CoV-2 by FDA under an Emergency Use Authorization (EUA). This EUA will remain in effect (meaning this test can be used) for the duration of the COVID-19 declaration under Section 564(b)(1) of the Act, 21 U.S.C. section 360bbb-3(b)(1), unless the authorization is terminated or revoked.  Performed at [facility], [address], [place], [place] [address]   MRSA Next Gen by PCR, Nasal     Status: None   Collection Time: 09/18/24  5:00 PM   Specimen: Nasal Mucosa; Nasal Swab  Result Value Ref Range Status   MRSA by PCR Next Gen NOT DETECTED NOT DETECTED Final    Comment: (NOTE) The GeneXpert MRSA Assay (FDA approved for NASAL specimens only), is one component of a comprehensive MRSA colonization surveillance program. It is not intended to diagnose MRSA infection nor to guide or monitor treatment for MRSA infections. Test performance is not FDA approved in patients less than 61 years old. Performed at [facility], [address], [place], [place] [address]          Radiology Studies: DG Chest 2 View Result Date: 09/21/2024 EXAM: 2 VIEW(S) XRAY OF THE CHEST 09/21/2024 01:20:00 PM COMPARISON: 09/18/2024 CLINICAL HISTORY: Hypoxia FINDINGS: LINES, TUBES AND DEVICES: Stable left IJ port catheter to proximal right atrium. LUNGS AND PLEURA: Right upper lung staple line. Similar patchy airspace opacities throughout right lung. Stable coarse interstitial opacities at left lung base. Small right pleural effusion with blunting of right lateral costophrenic angle. No pneumothorax. HEART AND  MEDIASTINUM: No acute abnormality of the cardiac and mediastinal silhouettes. BONES AND SOFT TISSUES: No  acute osseous abnormality. IMPRESSION: 1. Similar pneumonia on the right lung. Small right pleural effusion. Electronically signed by: Norman Gatlin MD 09/21/2024 10:53 PM EST RP Workstation: [id number]           LOS: 4 days   Time spent= 35 mins    Deliliah Room, MD [facility]  If 7PM-7AM, please contact night-coverage  09/22/2024, 9:51 AM  "

## 2024-09-22 NOTE — Plan of Care (Signed)

## 2024-09-22 NOTE — Progress Notes (Signed)
 Went in to patient's room to give a PRN treatment.  Patient was wheezing some.  When O2 sat checked, patient was again at 99%, turned flow down to 4L after treatment.

## 2024-09-22 NOTE — Progress Notes (Signed)
 PHARMACY - ANTICOAGULATION CONSULT NOTE  Pharmacy Consult for warfarin Indication: atrial fibrillation  Allergies[1]  Patient Measurements: Height: 5' 10 (177.8 cm) Weight: 92.2 kg (203 lb 4.2 oz) IBW/kg (Calculated) : 73 HEPARIN  DW (KG): 91.9  Vital Signs: Temp: 98 F (36.7 C) (12/24 0545) Temp Source: Oral (12/24 0545) BP: 136/84 (12/24 0545) Pulse Rate: 85 (12/24 0545)  Labs: Recent Labs    09/20/24 0528 09/21/24 0428 09/22/24 0449  LABPROT 38.3* 38.7* 34.0*  INR 3.7* 3.7* 3.2*  CREATININE  --   --  0.87    Estimated Creatinine Clearance: 85 mL/min (by C-G formula based on SCr of 0.87 mg/dL).   Medical History: Past Medical History:  Diagnosis Date   A-fib (HCC) 2017   after last lung surgery patient had a history if Afib documented in hospital    AAA (abdominal aortic aneurysm)    4.0 cm 09/17/22 US    Acute appendicitis 07/14/2021   Anxiety and depression 09/18/2024   Arthritis    back & legs ( knees)   Cancer (HCC)    lung   Complication of anesthesia    agitated upon waking from anesth.    Dyspnea    Dysrhythmia    A. Fib   Emphysema    Emphysema of lung (HCC)    GERD (gastroesophageal reflux disease) 09/18/2024   History of kidney stones    Hyperlipidemia    Hypertension    Oxygen  dependent    2L continuous   Peripheral vascular disease    Aortic Aneurysm   Pneumonia    Pre-diabetes    Sleep apnea    No OSA, but wears 2L [place] O2 continuous including at night   Stroke [facility]) 2022   Right sided weakness, decreased hearing, balance and memory loss   Tobacco abuse     Medications:  Medications Prior to Admission  Medication Sig Dispense Refill Last Dose/Taking   albuterol  (VENTOLIN  HFA) 108 (90 Base) MCG/ACT inhaler Inhale 2 puffs into the lungs every 4 (four) hours as needed for wheezing or shortness of breath. 8 g 2 09/18/2024 Morning   budesonide -glycopyrrolate -formoterol  (BREZTRI  AEROSPHERE) 160-9-4.8 MCG/ACT AERO inhaler Inhale 2 puffs  into the lungs 2 (two) times daily.   09/18/2024 Morning   cetirizine  (ZYRTEC ) 10 MG tablet Take 10 mg by mouth daily.   09/18/2024 Morning   diltiazem  (CARDIZEM  CD) 120 MG 24 hr capsule Take 1 capsule (120 mg total) by mouth daily. 30 capsule 1 09/18/2024 Morning   fenofibrate  (TRICOR ) 145 MG tablet Take 1 tablet (145 mg total) by mouth daily. 90 tablet 1 09/18/2024 Morning   finasteride  (PROSCAR ) 5 MG tablet Take 1 tablet by mouth once daily 30 tablet 0 09/18/2024 Morning   guaiFENesin -dextromethorphan  (ROBITUSSIN DM) 100-10 MG/5ML syrup Take 10 mLs by mouth every 8 (eight) hours as needed for cough.   09/18/2024 Morning   ipratropium-albuterol  (DUONEB) 0.5-2.5 (3) MG/3ML SOLN Take 3 mLs by nebulization every 6 (six) hours as needed. (Patient taking differently: Take 3 mLs by nebulization every 6 (six) hours.) 360 mL 1 09/18/2024 Morning   magnesium  oxide (MAG-OX) 400 (240 Mg) MG tablet Take 1 tablet (400 mg total) by mouth 2 (two) times daily. 60 tablet 5 09/18/2024 Morning   metoprolol  succinate (TOPROL  XL) 100 MG 24 hr tablet Take 1 tablet (100 mg total) by mouth daily. Take with or immediately following a meal. 30 tablet 1 09/18/2024 at  7:55 AM   rosuvastatin  (CRESTOR ) 40 MG tablet Take 1 tablet (40 mg total)  by mouth daily. 90 tablet 3 09/18/2024 Morning   tamsulosin  (FLOMAX ) 0.4 MG CAPS capsule Take 2 capsules (0.8 mg total) by mouth daily. 180 capsule 3 09/18/2024 Morning   warfarin (COUMADIN ) 5 MG tablet Take 0.5-1 tablets (2.5-5 mg total) by mouth See admin instructions. Take 2.5 mg by mouth Tuesday and Friday and take 5 mg on Monday, Wednesday, Thursday, Saturday and Sunday (Patient taking differently: Take 2.5-5 mg by mouth See admin instructions. TAD per [facility]/Dose changed on 09/13/2024)   09/17/2024 at  5:00 PM   HYDROcodone  bit-homatropine (HYCODAN) 5-1.5 MG/5ML syrup Take 5 mLs by mouth every 6 (six) hours as needed for cough. (Patient not taking: Reported on 09/18/2024) 120 mL  0 Not Taking   hydrOXYzine  (ATARAX ) 25 MG tablet Take 1 tablet (25 mg total) by mouth every 8 (eight) hours as needed for itching. (Patient not taking: Reported on 09/18/2024) 30 tablet 5 Not Taking   OXYGEN  Inhale 2 L into the lungs continuous. Hx of lung ca, copd, and emphysema      Palonosetron  HCl (ALOXI  IV) Inject into the vein.       Assessment: Pharmacy consulted to dose warfarin in patient with atrial fibrillation.  Patient's home dose listed as 5 mg on Mon + Fri and 2.5 mg ROW.  INR on admission is therapeutic at 2.6.  INR 2.6 > 3.5>3.7> 3.7>3.2, supratherapeutic Hgb 9.6  Goal of Therapy:  INR 2-3 Monitor platelets by anticoagulation protocol: Yes   Plan:  Hold warfarin x 1 dose Monitor daily INR and s/s of bleeding  Elspeth Sour, PharmD Clinical Pharmacist 09/22/2024 8:04 AM          [1] No Active Allergies

## 2024-09-22 NOTE — Progress Notes (Signed)
 1417 Patient asleep and his wife declined nebulizer at this time. RT stated she would return at a later time. At 1513 his nurse (Amy Kilgore) had administered the treatment.

## 2024-09-22 NOTE — Progress Notes (Signed)
 OT Cancellation Note  Patient Details Name: Randall Sullivan MRN: [id number] DOB: 22-Jun-1951   Cancelled Treatment:    Reason Eval/Treat Not Completed: Other (comment). Pt reported he was not feeling well and was not interested in therapy this morning. Wife reported that the pt had a rough night. Will attempt evaluation later as time permits.   Nysir Fergusson OT, MOT   Jayson Person 09/22/2024, 10:04 AM

## 2024-09-23 ENCOUNTER — Inpatient Hospital Stay (HOSPITAL_COMMUNITY)

## 2024-09-23 DIAGNOSIS — J9621 Acute and chronic respiratory failure with hypoxia: Secondary | ICD-10-CM | POA: Diagnosis not present

## 2024-09-23 LAB — CULTURE, BLOOD (ROUTINE X 2)
Culture: NO GROWTH
Culture: NO GROWTH
Special Requests: ADEQUATE
Special Requests: ADEQUATE

## 2024-09-23 LAB — GLUCOSE, CAPILLARY
Glucose-Capillary: 110 mg/dL — ABNORMAL HIGH (ref 70–99)
Glucose-Capillary: 99 mg/dL (ref 70–99)
Glucose-Capillary: 194 mg/dL — ABNORMAL HIGH (ref 70–99)
Glucose-Capillary: 330 mg/dL — ABNORMAL HIGH (ref 70–99)

## 2024-09-23 LAB — PROTIME-INR
INR: 2.3 — ABNORMAL HIGH (ref 0.8–1.2)
Prothrombin Time: 26 s — ABNORMAL HIGH (ref 11.4–15.2)

## 2024-09-23 LAB — EXPECTORATED SPUTUM ASSESSMENT W GRAM STAIN, RFLX TO RESP C

## 2024-09-23 MED ORDER — IOHEXOL 300 MG/ML  SOLN
75.0000 mL | Freq: Once | INTRAMUSCULAR | Status: AC | PRN
  Administered 2024-09-23: 75 mL via INTRAVENOUS

## 2024-09-23 MED ORDER — PREDNISONE 20 MG PO TABS
40.0000 mg | ORAL_TABLET | Freq: Two times a day (BID) | ORAL | Status: DC
  Administered 2024-09-23 – 2024-09-28 (×10): 40 mg via ORAL
  Filled 2024-09-23 (×7): qty 2

## 2024-09-23 MED ORDER — WARFARIN SODIUM 2.5 MG PO TABS
2.5000 mg | ORAL_TABLET | Freq: Once | ORAL | Status: AC
  Administered 2024-09-23: 2.5 mg via ORAL
  Filled 2024-09-23: qty 1

## 2024-09-23 NOTE — Progress Notes (Signed)
 Came to do a PRN treatment for patient.  Patient was 99% on 4L; turned patient down to 3L.

## 2024-09-23 NOTE — Progress Notes (Addendum)
 " PROGRESS NOTE    Randall Sullivan  FMW:[id number] DOB: 1951/07/21 DOA: 09/18/2024 PCP: Tobie Suzzane POUR, MD   Brief Narrative:    73 y.o. male with medical history significant of non-small cell lung cancer, COPD, chronic respiratory failure on 2-3 L supplementation, paroxysmal atrial fibrillation (on Coumadin ), AAA, hypertension, hyperlipidemia, prediabetes, BPH, and prior history of CVA; who presented to the hospital secondary to shortness of breath and worsening mildly productive coughing spells.   Admitted for management of acute chronic hypoxic respiratory failure comfortably as well as COPD exacerbation.  Lives at home with his wife and uses 2 to 3 L oxygen  at baseline.  Assessment & Plan:  Principal Problem:   Acute on chronic respiratory failure with hypoxia (HCC) Active Problems:   Small cell lung cancer (HCC)   Essential hypertension   Mixed hyperlipidemia   Type 2 diabetes mellitus (HCC)   AAA (abdominal aortic aneurysm)   Atrial fibrillation (HCC)   COPD with acute exacerbation (HCC)   Anxiety and depression   GERD (gastroesophageal reflux disease)   BPH (benign prostatic hyperplasia)   Elevated brain natriuretic peptide (BNP) level   CVA (cerebral vascular accident) (HCC)   Acute on chronic hypoxic respiratory failure, POA: Needing 3 L oxygen  now.  At home, he is on 2 to 3 L oxygen .  This in the setting of COPD exacerbation as well as possible bacterial community-acquired pneumonia.  Continue with antibiotics, nebulization treatments, mucolytics, supportive care as well as flutter valve. 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 -Discussed with Dr Theophilus, PCCM at [facility] on 09/23/24 and he agreed with the plan of increasing the dose of prednisone  and having goals of care discussion with  the patient and his wife.  Possible bacterial community-acquired pneumonia, POA: Continue with intravenous antibiotics.  Tested negative for COVID-19, RSV and influenza panel. -Reviewed CT Chest with contrast today and it showed extensive ground glass opacities and severe emphysema. -No indication for bronchoscopy.  Abdominal aortic aneurysm, POA: No history of rupture.  Outpatient follow-up.  Denies any abdominal pain or chest pain.  Atrial fibrillation, paroxysmal, POA: Status post cardioversion.  Continue with Cardizem  and metoprolol .  Continue with Coumadin  for stroke prophylaxis.  Continue to monitor on telemetry closely  Acute on chronic COPD exacerbation, POA: Continue with initial bronchodilator therapy, steroids, mucolytic's as well as flutter valve.  Outpatient follow-up with Pulmonology.  Anxiety, major moderate depression, POA: Continue with BuSpar  as well as Atarax .  GERD, POA: Continue PPI  CVA, POA: No acute issues.  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  Follows up outpatient with oncology, Dr Davonna. There is a concern for immunotherapy related lung damage so immunotherapy was stopped. S/p left upper lobectomy   Essential hypertension, POA: Continue with home medications  Hyperlipidemia, POA: Continue with statin  Chronic diastolic CHF, POA: NYHA class III.   fluid restriction of 1.5 L/day, strict intake output monitoring and heart healthy diet. Transition to oral Lasix  40 mg daily on 09/22/2024.  Prior to that, he was on 40 mg intravenous twice daily  BPH, POA: Continue with Flomax   Diabetes mellitus type 2, POA: Continue monitor blood glucose level closely.  Most recent hemoglobin A1c is 5.4%  Disposition: Home.  Lives at home  with his wife.   DVT prophylaxis: Place TED hose Start: 09/19/24 1654     Code Status: Full Code Family Communication: Wife is  present at the bedside Status is: Inpatient Remains inpatient appropriate because: Acute on chronic hypoxic aspiratory failure, COPD, pneumonia    Subjective:  He says that he still feels short of breath although he is requiring 3 L oxygen  which is her home baseline oxygen  requirement.  We spoke about the results of the chest x-ray which was done this morning and was unremarkable.  I also spoke to them about getting a CT of the chest today.  Examination:  General exam: Appears to be in mild distress, nasal oxygen  in place at 4 L Respiratory system: Slightly coarse breath sounds bilaterally with occasional expiratory wheezing Cardiovascular system: S1 & S2 heard, RRR. No JVD, murmurs, rubs, gallops or clicks. No pedal edema. Gastrointestinal system: Abdomen is nondistended, soft and nontender. No organomegaly or masses felt. Normal bowel sounds heard. Central nervous system: Alert and oriented. No focal neurological deficits. Extremities: Symmetric 5 x 5 power. Skin: No rashes, lesions or ulcers Psychiatry: Judgement and insight appear normal. Mood & affect appropriate.       Diet Orders (From admission, onward)     Start     Ordered   09/18/24 1204  Diet heart healthy/carb modified Room service appropriate? Yes; Fluid consistency: Thin  Diet effective now       Question Answer Comment  Diet-HS Snack? Nothing   Room service appropriate? Yes   Fluid consistency: Thin      09/18/24 1204            Objective: Vitals:   09/23/24 0512 09/23/24 0817 09/23/24 0821 09/23/24 0824  BP:  132/73    Pulse:  94    Resp:  18    Temp:  97.8 F (36.6 C)    TempSrc:  Oral    SpO2:  96% 98% 98%  Weight: 91.7 kg     Height:        Intake/Output Summary (Last 24 hours) at 09/23/2024 0901 Last data filed at 09/23/2024 0555 Gross per 24 hour  Intake 1140 ml  Output 1450 ml  Net -310 ml   Filed Weights   09/21/24 0300 09/22/24 0554 09/23/24 0512  Weight: 92.7 kg 92.2 kg 91.7  kg    Scheduled Meds:  arformoterol   15 mcg Nebulization BID   azithromycin   250 mg Oral Daily   budesonide  (PULMICORT ) nebulizer solution  0.5 mg Nebulization BID   busPIRone   5 mg Oral TID   dextromethorphan -guaiFENesin   1 tablet Oral BID   diltiazem   120 mg Oral Daily   feeding supplement  237 mL Oral BID BM   furosemide   40 mg Oral Daily   ipratropium-albuterol   3 mL Nebulization TID   loratadine   10 mg Oral Daily   metoprolol  succinate  100 mg Oral Daily   pantoprazole   40 mg Oral BID   predniSONE   40 mg Oral Q breakfast   rosuvastatin   40 mg Oral Daily   tamsulosin   0.8 mg Oral Daily   Warfarin - Pharmacist Dosing Inpatient   Does not apply q1600   Continuous Infusions:  cefTRIAXone  (ROCEPHIN )  IV 200 mL/hr at 09/23/24 0555    Nutritional status     Body mass index is 29.01 kg/m.  Data Reviewed:   CBC: Recent Labs  Lab 09/18/24 0922 09/19/24 0354  WBC 8.4 8.3  NEUTROABS 6.5  --   HGB  10.4* 9.6*  HCT 36.4* 33.4*  MCV 81.8 81.1  PLT 195 189   Basic Metabolic Panel: Recent Labs  Lab 09/18/24 0922 09/18/24 1204 09/19/24 0354 09/22/24 0449  NA 144  --  144 144  K 4.3  --  4.4 4.1  CL 103  --  102 98  CO2 38*  --  37* 36*  GLUCOSE 104*  --  164* 151*  BUN 16  --  22 36*  CREATININE 0.89  --  0.87 0.87  CALCIUM  9.4  --  9.2 9.2  MG  --  1.9  --   --   PHOS  --  2.5  --   --    GFR: Estimated Creatinine Clearance: 84.8 mL/min (by C-G formula based on SCr of 0.87 mg/dL). Liver Function Tests: Recent Labs  Lab 09/18/24 0922  AST 20  ALT 16  ALKPHOS 48  BILITOT 0.8  PROT 6.7  ALBUMIN  4.0   No results for input(s): LIPASE, AMYLASE in the last 168 hours. No results for input(s): AMMONIA in the last 168 hours. Coagulation Profile: Recent Labs  Lab 09/19/24 0354 09/20/24 0528 09/21/24 0428 09/22/24 0449 09/23/24 0425  INR 3.5* 3.7* 3.7* 3.2* 2.3*   Cardiac Enzymes: No results for input(s): CKTOTAL, CKMB, CKMBINDEX,  TROPONINI in the last 168 hours. BNP (last 3 results) Recent Labs    08/02/24 0805 09/18/24 0922  PROBNP 2,402.0* 2,453.0*   HbA1C: No results for input(s): HGBA1C in the last 72 hours. CBG: Recent Labs  Lab 09/22/24 0718 09/22/24 1103 09/22/24 1656 09/22/24 2113 09/23/24 0723  GLUCAP 157* 167* 248* 196* 110*   Lipid Profile: No results for input(s): CHOL, HDL, LDLCALC, TRIG, CHOLHDL, LDLDIRECT in the last 72 hours. Thyroid  Function Tests: No results for input(s): TSH, T4TOTAL, FREET4, T3FREE, THYROIDAB in the last 72 hours. Anemia Panel: No results for input(s): VITAMINB12, FOLATE, FERRITIN, TIBC, IRON, RETICCTPCT in the last 72 hours. Sepsis Labs: Recent Labs  Lab 09/18/24 9077  LATICACIDVEN 1.1    Recent Results (from the past 240 hours)  Culture, blood (Routine x 2)     Status: None   Collection Time: 09/18/24  9:22 AM   Specimen: BLOOD RIGHT ARM  Result Value Ref Range Status   Specimen Description   Final    BLOOD RIGHT ARM BOTTLES DRAWN AEROBIC AND ANAEROBIC   Special Requests Blood Culture adequate volume  Final   Culture   Final    NO GROWTH 5 DAYS Performed at [facility], [address] [place], [place] [address]    Report Status 09/23/2024 FINAL  Final  Culture, blood (Routine x 2)     Status: None   Collection Time: 09/18/24  9:22 AM   Specimen: BLOOD RIGHT ARM  Result Value Ref Range Status   Specimen Description   Final    BLOOD RIGHT ARM BOTTLES DRAWN AEROBIC AND ANAEROBIC   Special Requests Blood Culture adequate volume  Final   Culture   Final    NO GROWTH 5 DAYS Performed at [facility], [address], [place], [place] [address]    Report Status 09/23/2024 FINAL  Final  Resp panel by RT-PCR (RSV, Flu A&B, Covid) Anterior Nasal Swab     Status: None   Collection Time: 09/18/24  9:22 AM   Specimen: Anterior Nasal Swab  Result Value Ref Range Status   SARS Coronavirus 2 by RT PCR NEGATIVE NEGATIVE  Final    Comment: (NOTE) SARS-CoV-2 target nucleic acids are NOT  DETECTED.  The SARS-CoV-2 RNA is generally detectable in upper respiratory specimens during the acute phase of infection. The lowest concentration of SARS-CoV-2 viral copies this assay can detect is 138 copies/mL. A negative result does not preclude SARS-Cov-2 infection and should not be used as the sole basis for treatment or other patient management decisions. A negative result may occur with  improper specimen collection/handling, submission of specimen other than nasopharyngeal swab, presence of viral mutation(s) within the areas targeted by this assay, and inadequate number of viral copies(<138 copies/mL). A negative result must be combined with clinical observations, patient history, and epidemiological information. The expected result is Negative.  Fact Sheet for Patients:  bloggercourse.com  Fact Sheet for Healthcare Providers:  seriousbroker.it  This test is no t yet approved or cleared by the [place]  FDA and  has been authorized for detection and/or diagnosis of SARS-CoV-2 by FDA under an Emergency Use Authorization (EUA). This EUA will remain  in effect (meaning this test can be used) for the duration of the COVID-19 declaration under Section 564(b)(1) of the Act, 21 U.S.C.section 360bbb-3(b)(1), unless the authorization is terminated  or revoked sooner.       Influenza A by PCR NEGATIVE NEGATIVE Final   Influenza B by PCR NEGATIVE NEGATIVE Final    Comment: (NOTE) The Xpert Xpress SARS-CoV-2/FLU/RSV plus assay is intended as an aid in the diagnosis of influenza from Nasopharyngeal swab specimens and should not be used as a sole basis for treatment. Nasal washings and aspirates are unacceptable for Xpert Xpress SARS-CoV-2/FLU/RSV testing.  Fact Sheet for Patients: bloggercourse.com  Fact Sheet for Healthcare  Providers: seriousbroker.it  This test is not yet approved or cleared by the [place]  FDA and has been authorized for detection and/or diagnosis of SARS-CoV-2 by FDA under an Emergency Use Authorization (EUA). This EUA will remain in effect (meaning this test can be used) for the duration of the COVID-19 declaration under Section 564(b)(1) of the Act, 21 U.S.C. section 360bbb-3(b)(1), unless the authorization is terminated or revoked.     Resp Syncytial Virus by PCR NEGATIVE NEGATIVE Final    Comment: (NOTE) Fact Sheet for Patients: bloggercourse.com  Fact Sheet for Healthcare Providers: seriousbroker.it  This test is not yet approved or cleared by the [place]  FDA and has been authorized for detection and/or diagnosis of SARS-CoV-2 by FDA under an Emergency Use Authorization (EUA). This EUA will remain in effect (meaning this test can be used) for the duration of the COVID-19 declaration under Section 564(b)(1) of the Act, 21 U.S.C. section 360bbb-3(b)(1), unless the authorization is terminated or revoked.  Performed at [facility], [address] [place], [place] [address]   MRSA Next Gen by PCR, Nasal     Status: None   Collection Time: 09/18/24  5:00 PM   Specimen: Nasal Mucosa; Nasal Swab  Result Value Ref Range Status   MRSA by PCR Next Gen NOT DETECTED NOT DETECTED Final    Comment: (NOTE) The GeneXpert MRSA Assay (FDA approved for NASAL specimens only), is one component of a comprehensive MRSA colonization surveillance program. It is not intended to diagnose MRSA infection nor to guide or monitor treatment for MRSA infections. Test performance is not FDA approved in patients less than 24 years old. Performed at [facility], [address], [place], [place] [address]          Radiology Studies: DG CHEST PORT 1 VIEW Result Date: 09/23/2024 EXAM: 1 VIEW(S) XRAY OF THE CHEST  09/23/2024 04:52:29 AM  COMPARISON: AP and lateral chest 09/21/2024. CLINICAL HISTORY: Hypoxia 200808 Hypoxia. FINDINGS: LINES, TUBES AND DEVICES: Left IJ port catheter terminates at the superior cavoatrial junction. LUNGS AND PLEURA: The lungs are emphysematous with bilateral postsurgical and scarring changes again noted. Patchy airspace infiltrate continues in the right mid to lower lung field with a small right pleural effusion. No other focal infiltrate seen. No new abnormality. No pneumothorax. HEART AND MEDIASTINUM: There is aortic atherosclerosis. Mild cardiomegaly. No acute abnormality of the mediastinal and cardiac silhouette. BONES AND SOFT TISSUES: No acute osseous abnormality. IMPRESSION: 1. Patchy airspace infiltrate in the right mid to lower lung field with a small right pleural effusion. Overall aeration seems unchanged. 2. Emphysematous lungs with bilateral postsurgical and scarring changes. 3. Mild cardiomegaly. Electronically signed by: Francis Quam MD 09/23/2024 05:05 AM EST RP Workstation: [id number]   DG Chest 2 View Result Date: 09/21/2024 EXAM: 2 VIEW(S) XRAY OF THE CHEST 09/21/2024 01:20:00 PM COMPARISON: 09/18/2024 CLINICAL HISTORY: Hypoxia FINDINGS: LINES, TUBES AND DEVICES: Stable left IJ port catheter to proximal right atrium. LUNGS AND PLEURA: Right upper lung staple line. Similar patchy airspace opacities throughout right lung. Stable coarse interstitial opacities at left lung base. Small right pleural effusion with blunting of right lateral costophrenic angle. No pneumothorax. HEART AND MEDIASTINUM: No acute abnormality of the cardiac and mediastinal silhouettes. BONES AND SOFT TISSUES: No acute osseous abnormality. IMPRESSION: 1. Similar pneumonia on the right lung. Small right pleural effusion. Electronically signed by: Norman Gatlin MD 09/21/2024 10:53 PM EST RP Workstation: [id number]           LOS: 5 days   Time spent= 35 mins    Deliliah Room, MD [facility]  If 7PM-7AM, please contact night-coverage  09/23/2024, 9:01 AM  "

## 2024-09-23 NOTE — Progress Notes (Addendum)
 SATURATION QUALIFICATIONS: (This note is used to comply with regulatory documentation for home oxygen )  Patient can not tolerate testing O2 with ambulation on Room air. O2 via [place] was used during this evaluation.  Patient Saturations on 3 L [place] at Rest = 94%  Patient Saturations on 3 L [place] while Ambulating = 91%  Patient Saturations on 3  L [place]  while at rest, sitting down after ambulation 20 ft = O2 sat 84% with labored breathing, after 1 min O2 sat 85-87%, O2 increased to 4 L [place], O2  sat 90 %, after another 3 min O2 sat 96 % on 4 L [place], then O2 decreased to 3.5 L [place] and O2 sat 92 %   Please briefly explain why patient needs home oxygen : Patient became off balance ambulating, and stated felt dizzy. Upon sitting down, O2 sat 84 % and patient required several minutes sitting on side of bed to recover, with RN increasing O2 to 4 L [place] briefly. Patient's wife stated, patient uses 2 L [place] at home and turns it up to 3 L [place] when ambulating at baseline.

## 2024-09-23 NOTE — Plan of Care (Signed)

## 2024-09-23 NOTE — Progress Notes (Signed)
 PHARMACY - ANTICOAGULATION CONSULT NOTE  Pharmacy Consult for warfarin Indication: atrial fibrillation  Allergies[1]  Patient Measurements: Height: 5' 10 (177.8 cm) Weight: 91.7 kg (202 lb 2.6 oz) IBW/kg (Calculated) : 73 HEPARIN  DW (KG): 91.9  Vital Signs: Temp: 97.8 F (36.6 C) (12/25 0817) Temp Source: Oral (12/25 0817) BP: 132/73 (12/25 0817) Pulse Rate: 94 (12/25 0817)  Labs: Recent Labs    09/21/24 0428 09/22/24 0449 09/23/24 0425  LABPROT 38.7* 34.0* 26.0*  INR 3.7* 3.2* 2.3*  CREATININE  --  0.87  --     Estimated Creatinine Clearance: 84.8 mL/min (by C-G formula based on SCr of 0.87 mg/dL).   Medical History: Past Medical History:  Diagnosis Date   A-fib (HCC) 2017   after last lung surgery patient had a history if Afib documented in hospital    AAA (abdominal aortic aneurysm)    4.0 cm 09/17/22 US    Acute appendicitis 07/14/2021   Anxiety and depression 09/18/2024   Arthritis    back & legs ( knees)   Cancer (HCC)    lung   Complication of anesthesia    agitated upon waking from anesth.    Dyspnea    Dysrhythmia    A. Fib   Emphysema    Emphysema of lung (HCC)    GERD (gastroesophageal reflux disease) 09/18/2024   History of kidney stones    Hyperlipidemia    Hypertension    Oxygen  dependent    2L continuous   Peripheral vascular disease    Aortic Aneurysm   Pneumonia    Pre-diabetes    Sleep apnea    No OSA, but wears 2L [place] O2 continuous including at night   Stroke [facility]) 2022   Right sided weakness, decreased hearing, balance and memory loss   Tobacco abuse     Assessment: Pharmacy consulted to dose warfarin in patient with atrial fibrillation.  Patient's home dose listed as 5 mg on Mon + Fri and 2.5 mg ROW.  INR on admission is therapeutic at 2.6.  INR 2.6 > 3.5>3.7> 3.7>3.2>2.3 today, now in goal.  Hgb 9.6  Goal of Therapy:  INR 2-3 Monitor platelets by anticoagulation protocol: Yes   Plan:  Warfarin 2.5mg   tonight Monitor daily INR and s/s of bleeding  Dempsey Blush PharmD., BCPS Clinical Pharmacist 09/23/2024 9:13 AM     [1] No Active Allergies

## 2024-09-24 ENCOUNTER — Telehealth: Payer: Self-pay | Admitting: Pulmonary Disease

## 2024-09-24 ENCOUNTER — Telehealth: Payer: Self-pay

## 2024-09-24 DIAGNOSIS — R0902 Hypoxemia: Secondary | ICD-10-CM

## 2024-09-24 DIAGNOSIS — Z7189 Other specified counseling: Secondary | ICD-10-CM

## 2024-09-24 DIAGNOSIS — Z515 Encounter for palliative care: Secondary | ICD-10-CM

## 2024-09-24 DIAGNOSIS — Z66 Do not resuscitate: Secondary | ICD-10-CM

## 2024-09-24 DIAGNOSIS — J9621 Acute and chronic respiratory failure with hypoxia: Secondary | ICD-10-CM | POA: Diagnosis not present

## 2024-09-24 DIAGNOSIS — Z789 Other specified health status: Secondary | ICD-10-CM

## 2024-09-24 LAB — GLUCOSE, CAPILLARY
Glucose-Capillary: 114 mg/dL — ABNORMAL HIGH (ref 70–99)
Glucose-Capillary: 185 mg/dL — ABNORMAL HIGH (ref 70–99)
Glucose-Capillary: 184 mg/dL — ABNORMAL HIGH (ref 70–99)
Glucose-Capillary: 189 mg/dL — ABNORMAL HIGH (ref 70–99)

## 2024-09-24 LAB — PROTIME-INR
INR: 1.9 — ABNORMAL HIGH (ref 0.8–1.2)
Prothrombin Time: 23 s — ABNORMAL HIGH (ref 11.4–15.2)

## 2024-09-24 MED ORDER — WARFARIN SODIUM 2 MG PO TABS
3.0000 mg | ORAL_TABLET | Freq: Once | ORAL | Status: AC
  Administered 2024-09-24: 3 mg via ORAL
  Filled 2024-09-24: qty 1

## 2024-09-24 MED ORDER — INSULIN ASPART 100 UNIT/ML IJ SOLN
0.0000 [IU] | Freq: Every day | INTRAMUSCULAR | Status: DC
  Administered 2024-09-25: 2 [IU] via SUBCUTANEOUS
  Administered 2024-09-26 – 2024-09-27 (×2): 5 [IU] via SUBCUTANEOUS
  Filled 2024-09-24 (×2): qty 1

## 2024-09-24 MED ORDER — INSULIN ASPART 100 UNIT/ML IJ SOLN
0.0000 [IU] | Freq: Three times a day (TID) | INTRAMUSCULAR | Status: DC
  Administered 2024-09-24 (×2): 2 [IU] via SUBCUTANEOUS
  Administered 2024-09-25 (×2): 3 [IU] via SUBCUTANEOUS
  Administered 2024-09-26: 5 [IU] via SUBCUTANEOUS
  Administered 2024-09-26: 2 [IU] via SUBCUTANEOUS
  Administered 2024-09-26: 1 [IU] via SUBCUTANEOUS
  Administered 2024-09-27: 5 [IU] via SUBCUTANEOUS
  Administered 2024-09-27: 3 [IU] via SUBCUTANEOUS
  Administered 2024-09-27: 1 [IU] via SUBCUTANEOUS
  Administered 2024-09-28: 3 [IU] via SUBCUTANEOUS
  Administered 2024-09-28: 1 [IU] via SUBCUTANEOUS
  Filled 2024-09-24 (×7): qty 1

## 2024-09-24 NOTE — Plan of Care (Signed)
" °  Problem: Education: Goal: Knowledge of General Education information will improve Description: Including pain rating scale, medication(s)/side effects and non-pharmacologic comfort measures Outcome: Progressing   Problem: Health Behavior/Discharge Planning: Goal: Ability to manage health-related needs will improve Outcome: Progressing   Problem: Clinical Measurements: Goal: Ability to maintain clinical measurements within normal limits will improve Outcome: Progressing Goal: Will remain free from infection Outcome: Progressing Goal: Diagnostic test results will improve Outcome: Progressing Goal: Respiratory complications will improve Outcome: Progressing Goal: Cardiovascular complication will be avoided Outcome: Not Applicable   Problem: Activity: Goal: Risk for activity intolerance will decrease Outcome: Progressing   Problem: Nutrition: Goal: Adequate nutrition will be maintained Outcome: Progressing   Problem: Coping: Goal: Level of anxiety will decrease Outcome: Progressing   Problem: Elimination: Goal: Will not experience complications related to bowel motility Outcome: Adequate for Discharge Goal: Will not experience complications related to urinary retention Outcome: Adequate for Discharge   Problem: Pain Managment: Goal: General experience of comfort will improve and/or be controlled Outcome: Progressing   Problem: Safety: Goal: Ability to remain free from injury will improve Outcome: Progressing   Problem: Skin Integrity: Goal: Risk for impaired skin integrity will decrease Outcome: Not Applicable   Problem: Education: Goal: Ability to describe self-care measures that may prevent or decrease complications (Diabetes Survival Skills Education) will improve Outcome: Progressing   Problem: Coping: Goal: Ability to adjust to condition or change in health will improve Outcome: Progressing   Problem: Fluid Volume: Goal: Ability to maintain a balanced  intake and output will improve Outcome: Not Applicable   Problem: Health Behavior/Discharge Planning: Goal: Ability to identify and utilize available resources and services will improve Outcome: Progressing Goal: Ability to manage health-related needs will improve Outcome: Progressing   "

## 2024-09-24 NOTE — Inpatient Diabetes Management (Signed)
 Inpatient Diabetes Program Recommendations  AACE/[name]: New Consensus Statement on Inpatient Glycemic Control (2015)  Target Ranges:  Prepandial:   less than 140 mg/dL      Peak postprandial:   less than 180 mg/dL (1-2 hours)      Critically ill patients:  140 - 180 mg/dL   Lab Results  Component Value Date   GLUCAP 114 (H) 09/24/2024   HGBA1C 5.4 05/25/2024    Review of Glycemic Control  Diabetes history: Pre-diabetes Outpatient Diabetes medications: None Current orders for Inpatient glycemic control:  Prednisone  40 mg daily  Inpatient Diabetes Program Recommendations:   Please consider: -Novolog  0-9 units tid, 0-5 units hs correction  Thank you, Randall E. Rodel Glaspy, RN, MSN, CNS, CDCES  Diabetes Coordinator Inpatient Glycemic Control Team Team Pager [phone] (8am-5pm) 09/24/2024 9:44 AM

## 2024-09-24 NOTE — Progress Notes (Signed)
 OT Cancellation Note  Patient Details Name: Randall Sullivan MRN: [id number] DOB: May 05, 1951   Cancelled Treatment:    Reason Eval/Treat Not Completed: Other (comment). Nursing working with pt on first attempt. On second attempt pt sleeping, family reports just fell asleep. Will attempt at a later time as schedule permits.     Sonny Cory, OTR/L  [phone] 09/24/2024, 11:14 AM

## 2024-09-24 NOTE — Telephone Encounter (Signed)
 Patient of Dr. Darlean.  Needs hospital discharge follow-up in pulmonary clinic with him at [facility] in the next 4 weeks.  Thank you

## 2024-09-24 NOTE — Consult Note (Signed)
 "                                                  Palliative Care Consult Note                                  Date: 09/24/2024   Patient Name: Randall Sullivan  DOB: May 09, 1951  MRN: [id number]  Age / Sex: 73 y.o., male  PCP: Tobie Suzzane POUR, MD Referring Physician: Dino Antu, MD  Reason for Consultation: Establishing goals of care  Past Medical History:  Diagnosis Date   A-fib [facility]) 2017   after last lung surgery patient had a history if Afib documented in hospital    AAA (abdominal aortic aneurysm)    4.0 cm 09/17/22 US    Acute appendicitis 07/14/2021   Anxiety and depression 09/18/2024   Arthritis    back & legs ( knees)   Cancer (HCC)    lung   Complication of anesthesia    agitated upon waking from anesth.    Dyspnea    Dysrhythmia    A. Fib   Emphysema    Emphysema of lung (HCC)    GERD (gastroesophageal reflux disease) 09/18/2024   History of kidney stones    Hyperlipidemia    Hypertension    Oxygen  dependent    2L continuous   Peripheral vascular disease    Aortic Aneurysm   Pneumonia    Pre-diabetes    Sleep apnea    No OSA, but wears 2L [place] O2 continuous including at night   Stroke [facility]) 2022   Right sided weakness, decreased hearing, balance and memory loss   Tobacco abuse     Subjective:   This NP Camellia Kays reviewed medical records, received report from team, assessed the patient and then meet at the patient's bedside to discuss diagnosis, prognosis, GOC, EOL wishes disposition and options.  Before meeting with the patient/family, I spent time reviewing the chart notes including most recent outpatient oncology note, RT note from yesterday, hospitalist note from yesterday, nursing notes from yesterday, nursing note from today, hospitalist note from today, OT note from today. I also reviewed vital signs, nursing flowsheets, medication administrations record, labs, and imaging. Labs reviewed include sputum culture from yesterday/respiratory culture  pending, most recent CBC from five days ago with normal white blood cell count of 8.3 in the setting of possible bacterial community-acquired pneumonia.  CT of the chest shows ground glass opacities.  I met with the patient at bedside, spouse, ex-wife, and daughter were all present.   We meet to discuss diagnosis prognosis, GOC, EOL wishes, disposition and options. Concept of Palliative Care was introduced as specialized medical care for people and their families living with serious illness.  If focuses on providing relief from the symptoms and stress of a serious illness.  The goal is to improve quality of life for both the patient and the family. Values and goals of care important to patient and family were attempted to be elicited.  Created space and opportunity for patient  and family to explore thoughts and feelings regarding current medical situation   Natural trajectory and current clinical status were discussed. Questions and concerns addressed. Patient  encouraged to call with questions or concerns.    Patient/Family Understanding  of Illness: The patient and family understand that his lungs are in a state now that will not get better and he likely has weeks to months to live.  It is not necessarily the cancer that is the problem but more so the emphysema and scarring.  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  Life Review: The patient is currently married, remains in contact and on good terms with his ex-wife as well.  He has 1 son and 1 daughter.  He previously worked in art therapist.  Things that bring him happiness include life, family.  He is Christian and practices in the [place] tradition.  Patient Values: Life, faith, family  Today's  Discussion: In addition to discussions described above we had extensive discussion of various topics.  We spent substantial time talking about his cancer journey including chemotherapy, radiation, and immunotherapy.  We spent time talking about his baseline underlying lung problems including COPD, complicated by recurrent lung cancer.  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  We spent time talking about research based statistical outcomes for short and long-term outcomes in patients in similar situations.  This explanation was consistent with what he was told with the hospitalist team this morning.  He understands this time is likely quite limited.  The patient's family was understandably emotional.  Given the severity of his lungs we talked about CODE STATUS.  We shared statistical outcomes and cardiopulmonary resuscitation in patients with similar situations and clinical statuses.  At the end of our conversation the patient elected to change his CODE STATUS to DNR/DNI.  At the end of our conversation, the patient and family would like some time to process this information, think, and speak amongst himself.  I shared that the hospitalist team is available for questions or concerns over the weekend.  I shared a palliative medicine would follow-up on Tuesday for ongoing GOC conversations.  I provided emotional and general support through therapeutic listening, empathy, sharing of stories, and other techniques. I answered all questions and addressed all concerns to the best of my ability.  Goals: DNR/DNI, continue full scope of care otherwise, time for discussion and consideration of options.  Agreeable to ongoing GOC conversations.  Review of Systems  Constitutional:  Positive for fatigue.  Respiratory:  Negative for shortness of breath (Breathing treatment recently helped).   Gastrointestinal:  Negative for abdominal pain, nausea and vomiting.   Neurological:  Positive for weakness.    Objective:   Primary Diagnoses: Present on Admission:  Acute on chronic respiratory failure with hypoxia (HCC)  AAA (abdominal aortic aneurysm)  Atrial fibrillation (HCC)  Essential hypertension  Mixed hyperlipidemia  Small cell lung cancer (HCC)   Vital Signs:  BP (!) 138/103 (BP Location: Right Arm)   Pulse 71   Temp 97.8 F (36.6 C) (Oral)   Resp 17   Ht 5' 10 (1.778 m)   Wt 90.6 kg   SpO2 100%   BMI 28.66 kg/m   Physical Exam Vitals and nursing note reviewed.  Constitutional:      General: He is not in acute distress.    Appearance: He is ill-appearing. He is not toxic-appearing.  HENT:     Head: Normocephalic and atraumatic.  Cardiovascular:     Rate and Rhythm: Normal rate.  Pulmonary:  Effort: Pulmonary effort is normal. No respiratory distress.  Abdominal:     General: Abdomen is flat.     Palpations: Abdomen is soft.  Skin:    General: Skin is warm and dry.  Neurological:     General: No focal deficit present.     Mental Status: He is alert and oriented to person, place, and time.  Psychiatric:        Mood and Affect: Mood normal.        Behavior: Behavior normal.     Palliative Assessment/Data: 40-50%   Advanced Care Planning:   Existing Vynca/ACP Documentation: None  Primary Decision Maker: PATIENT  Pertinent diagnosis: Non-small cell lung cancer, COPD exacerbation, acute on chronic respiratory failure, checkpoint inhibitor pneumonitis  The patient and/or family consented to a voluntary Advance Care Planning Conversation in person. Individuals present for the conversation: The patient; patient's spouse [name]; patient's daughter Mercy; patient's ex-wife Barnie  Summary of the conversation: We discussed details about chronic history including cancer journey, acute presentation, statistical outcomes and prognosis, patient's values and priorities, CODE STATUS  Outcome of the conversations and/or  documents completed: Transition to DNR-limited (DNR/DNI), time for information processing and discussion amongst family, full scope of care otherwise in the meantime, ongoing GOC conversations.  I spent 20 minutes providing separately identifiable ACP services with the patient and/or surrogate decision maker in a voluntary, in-person conversation discussing the patient's wishes and goals as detailed in the above note.  Assessment & Plan:   HPI/Patient Profile: 73 y.o. male  with past medical history of non-small cell lung cancer, COPD, chronic respiratory failure on 2-3 L supplementation, paroxysmal atrial fibrillation (on Coumadin ), AAA, hypertension, hyperlipidemia, prediabetes, BPH, and prior history of CVA who presented to the hospital secondary to shortness of breath and worsening mildly productive coughing spells. 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  Palliative medicine was consulted for GOC conversations.   SUMMARY OF RECOMMENDATIONS   Changed to DNR-Limited Continue full scope of care otherwise Time for thought and discussion amongst family Ongoing GOC conversations Palliative medicine will follow-up when we are back on service 09/28/2024  Symptom Management:  Per primary team Palliative medicine is available to assist as needed  Code Status: DNR - Limited (DNR/DNI)  Prognosis:  < 6 months  Discharge Planning:  To Be Determined   Discussed with: Patient, family, medical team, nursing team    Thank you for allowing us  to participate in the care of HEINZ ECKERT PMT will continue to support holistically.  Billing based on MDM: High  Problems Addressed: One or more chronic illnesses with severe exacerbation, progression, or side effects of treatment.  Risks: Decision not to resuscitate or  to de-escalate care because of poor prognosis  Detailed review of medical records (labs, imaging, vital signs), medically appropriate exam, discussed with treatment team, counseling and education to patient, family, & staff, documenting clinical information, medication management, coordination of care  Signed by: Camellia Kays, NP Palliative Medicine Team  Team Phone # [phone] (Nights/Weekends)  09/24/2024, 2:14 PM  "

## 2024-09-24 NOTE — Telephone Encounter (Signed)
Awaiting FMLA

## 2024-09-24 NOTE — Telephone Encounter (Signed)
 Copied from CRM [id number]. Topic: General - Other >> Sep 24, 2024  9:33 AM Franky GRADE wrote: Reason for CRM: Patient's spouse [name] is calling because her employer is faxing FMLA Paper work to be filled out. She also wanted to advise that the patient has been admitted in the hospital for about a week now.

## 2024-09-24 NOTE — Progress Notes (Signed)
 PHARMACY - ANTICOAGULATION CONSULT NOTE  Pharmacy Consult for warfarin Indication: atrial fibrillation  Allergies[1]  Patient Measurements: Height: 5' 10 (177.8 cm) Weight: 90.6 kg (199 lb 11.8 oz) IBW/kg (Calculated) : 73 HEPARIN  DW (KG): 91.9  Vital Signs: Temp: 97.8 F (36.6 C) (12/26 0603) Temp Source: Oral (12/26 0603) BP: 138/103 (12/26 0603) Pulse Rate: 71 (12/26 0603)  Labs: Recent Labs    09/22/24 0449 09/23/24 0425 09/24/24 0431  LABPROT 34.0* 26.0* 23.0*  INR 3.2* 2.3* 1.9*  CREATININE 0.87  --   --     Estimated Creatinine Clearance: 84.3 mL/min (by C-G formula based on SCr of 0.87 mg/dL).   Medical History: Past Medical History:  Diagnosis Date   A-fib (HCC) 2017   after last lung surgery patient had a history if Afib documented in hospital    AAA (abdominal aortic aneurysm)    4.0 cm 09/17/22 US    Acute appendicitis 07/14/2021   Anxiety and depression 09/18/2024   Arthritis    back & legs ( knees)   Cancer (HCC)    lung   Complication of anesthesia    agitated upon waking from anesth.    Dyspnea    Dysrhythmia    A. Fib   Emphysema    Emphysema of lung (HCC)    GERD (gastroesophageal reflux disease) 09/18/2024   History of kidney stones    Hyperlipidemia    Hypertension    Oxygen  dependent    2L continuous   Peripheral vascular disease    Aortic Aneurysm   Pneumonia    Pre-diabetes    Sleep apnea    No OSA, but wears 2L [address] O2 continuous including at night   Stroke [facility]) 2022   Right sided weakness, decreased hearing, balance and memory loss   Tobacco abuse     Assessment: Pharmacy consulted to dose warfarin in patient with atrial fibrillation.  Patient's home dose listed as 5 mg on Mon + Fri and 2.5 mg ROW.  INR on admission is therapeutic at 2.6.  INR 2.6 > 3.5>3.7> 3.7>3.2>2.3>1.9 today, just below goal.  Hgb 9.6 on 12/21  Goal of Therapy:  INR 2-3 Monitor platelets by anticoagulation protocol: Yes   Plan:  Warfarin  3mg  tonight Monitor daily INR and s/s of bleeding  Dempsey Blush PharmD., BCPS Clinical Pharmacist 09/24/2024 12:03 PM      [1] No Active Allergies

## 2024-09-24 NOTE — Plan of Care (Signed)

## 2024-09-24 NOTE — Progress Notes (Signed)
 " PROGRESS NOTE    Randall Sullivan  FMW:[id number] DOB: [birth year] DOA: 09/18/2024 PCP: Tobie Suzzane POUR, MD   Brief Narrative:    73 y.o. male with medical history significant of non-small cell lung cancer, COPD, chronic respiratory failure on 2-3 L supplementation, paroxysmal atrial fibrillation (on Coumadin ), AAA, hypertension, hyperlipidemia, prediabetes, BPH, and prior history of CVA; who presented to the hospital secondary to shortness of breath and worsening mildly productive coughing spells.   Admitted for management of acute chronic hypoxic respiratory failure comfortably as well as COPD exacerbation.  Lives at home with his wife and uses 2 to 3 L oxygen  at baseline.  Assessment & Plan:  Principal Problem:   Acute on chronic respiratory failure with hypoxia (HCC) Active Problems:   Small cell lung cancer (HCC)   Essential hypertension   Mixed hyperlipidemia   Type 2 diabetes mellitus (HCC)   AAA (abdominal aortic aneurysm)   Atrial fibrillation (HCC)   COPD with acute exacerbation (HCC)   Anxiety and depression   GERD (gastroesophageal reflux disease)   BPH (benign prostatic hyperplasia)   Elevated brain natriuretic peptide (BNP) level   CVA (cerebral vascular accident) (HCC)   Acute on chronic hypoxic respiratory failure, POA: Needing 3 L oxygen  now.  At home, he is on 2 to 3 L oxygen .  This in the setting of COPD exacerbation, immune therapy induced pneumonitis as well as possible bacterial community-acquired pneumonia.  Continue with antibiotics, nebulization treatments, mucolytics, supportive care as well as flutter valve. 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 -Discussed with Dr Theophilus, PCCM at [facility] on 09/23/24 and he agreed with the plan of increasing the dose of  prednisone  and having goals of care discussion with the patient and his wife. - Palliative consult placed  Possible bacterial community-acquired pneumonia, POA: Continue with intravenous antibiotics.  Tested negative for COVID-19, RSV and influenza panel. -Reviewed CT Chest with contrast today and it showed extensive ground glass opacities and severe emphysema. -No indication for bronchoscopy.  Abdominal aortic aneurysm, POA: No history of rupture.  Outpatient follow-up.  Denies any abdominal pain or chest pain.  Atrial fibrillation, paroxysmal, POA: Status post cardioversion.  Continue with Cardizem  and metoprolol .  Continue with Coumadin  for stroke prophylaxis.  Continue to monitor on telemetry closely  Acute on chronic COPD exacerbation, POA: Continue with initial bronchodilator therapy, steroids, mucolytic's as well as flutter valve.  Outpatient follow-up with Pulmonology.  Anxiety, major moderate depression, POA: Continue with BuSpar  as well as Atarax .  GERD, POA: Continue PPI  CVA, POA: No acute issues.  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  Follows up outpatient with oncology, Dr Davonna. There is a concern for immunotherapy related lung damage so immunotherapy was stopped. S/p left upper lobectomy   Essential hypertension, POA: Continue with home medications  Hyperlipidemia, POA: Continue with statin  Chronic diastolic CHF, POA: NYHA class III.   fluid restriction of 1.5 L/day, strict intake output monitoring and heart healthy diet. Transition to oral Lasix  40 mg daily on 09/22/2024.  Prior to that, he was on 40 mg intravenous twice daily  BPH, POA: Continue with Flomax   Diabetes mellitus type 2, POA: Continue monitor blood glucose level closely.  Most recent hemoglobin A1c is 5.4%  Disposition: Home.  Lives at home with his wife.   DVT prophylaxis: Place TED hose  Start: 09/19/24 1654     Code Status: Full Code Family Communication: Wife is present at the bedside Status is: Inpatient Remains inpatient appropriate because: Acute on chronic hypoxic aspiratory failure, COPD, pneumonia    Subjective:  He is feeling very weak, tired and still has shortness of breath.  Patient's wife was present at the bedside.  I discussed the results of the CT of the chest which was done yesterday.  I recommended palliative care consult and patient's wife is agreeable to that.  I also discussed the goals of the care including DNR/DNI status because of patient's worsening respiratory status.  I also informed them about the increased dose and frequency of steroids to help with the inflammation in his lungs.  Examination:  General exam: Appears to be in mild distress, nasal oxygen  in place at 3 L Respiratory system: Slightly coarse breath sounds bilaterally with occasional expiratory wheezing Cardiovascular system: S1 & S2 heard, RRR. No JVD, murmurs, rubs, gallops or clicks. No pedal edema. Gastrointestinal system: Abdomen is nondistended, soft and nontender. No organomegaly or masses felt. Normal bowel sounds heard. Central nervous system: Alert and oriented. No focal neurological deficits. Extremities: Symmetric 5 x 5 power. Skin: No rashes, lesions or ulcers Psychiatry: Judgement and insight appear normal. Mood & affect appropriate.       Diet Orders (From admission, onward)     Start     Ordered   09/18/24 1204  Diet heart healthy/carb modified Room service appropriate? Yes; Fluid consistency: Thin  Diet effective now       Question Answer Comment  Diet-HS Snack? Nothing   Room service appropriate? Yes   Fluid consistency: Thin      09/18/24 1204            Objective: Vitals:   09/23/24 1220 09/23/24 1255 09/23/24 2049 09/24/24 0603  BP:  (!) 112/91 131/73 (!) 138/103  Pulse:  (!) 101 (!) 105 71  Resp:   17 17  Temp:  97.7 F (36.5 C) 98.3 F  (36.8 C) 97.8 F (36.6 C)  TempSrc:  Oral Oral Oral  SpO2: 97% 92% 91% 92%  Weight:    90.6 kg  Height:        Intake/Output Summary (Last 24 hours) at 09/24/2024 0849 Last data filed at 09/24/2024 0500 Gross per 24 hour  Intake 0 ml  Output 1050 ml  Net -1050 ml   Filed Weights   09/22/24 0554 09/23/24 0512 09/24/24 0603  Weight: 92.2 kg 91.7 kg 90.6 kg    Scheduled Meds:  arformoterol   15 mcg Nebulization BID   azithromycin   250 mg Oral Daily   budesonide  (PULMICORT ) nebulizer solution  0.5 mg Nebulization BID   busPIRone   5 mg Oral TID   dextromethorphan -guaiFENesin   1 tablet Oral BID   diltiazem   120 mg Oral Daily   feeding supplement  237 mL Oral BID BM   furosemide   40 mg Oral Daily   ipratropium-albuterol   3 mL Nebulization TID   loratadine   10 mg Oral Daily   metoprolol  succinate  100 mg Oral Daily   pantoprazole   40 mg Oral BID   predniSONE   40 mg Oral BID WC   rosuvastatin   40 mg Oral Daily   tamsulosin   0.8 mg Oral Daily   Warfarin - Pharmacist Dosing Inpatient   Does not apply q1600  Continuous Infusions:  cefTRIAXone  (ROCEPHIN )  IV 1 g (09/23/24 0903)    Nutritional status     Body mass index is 28.66 kg/m.  Data Reviewed:   CBC: Recent Labs  Lab 09/18/24 0922 09/19/24 0354  WBC 8.4 8.3  NEUTROABS 6.5  --   HGB 10.4* 9.6*  HCT 36.4* 33.4*  MCV 81.8 81.1  PLT 195 189   Basic Metabolic Panel: Recent Labs  Lab 09/18/24 0922 09/18/24 1204 09/19/24 0354 09/22/24 0449  NA 144  --  144 144  K 4.3  --  4.4 4.1  CL 103  --  102 98  CO2 38*  --  37* 36*  GLUCOSE 104*  --  164* 151*  BUN 16  --  22 36*  CREATININE 0.89  --  0.87 0.87  CALCIUM  9.4  --  9.2 9.2  MG  --  1.9  --   --   PHOS  --  2.5  --   --    GFR: Estimated Creatinine Clearance: 84.3 mL/min (by C-G formula based on SCr of 0.87 mg/dL). Liver Function Tests: Recent Labs  Lab 09/18/24 0922  AST 20  ALT 16  ALKPHOS 48  BILITOT 0.8  PROT 6.7  ALBUMIN  4.0   No  results for input(s): LIPASE, AMYLASE in the last 168 hours. No results for input(s): AMMONIA in the last 168 hours. Coagulation Profile: Recent Labs  Lab 09/20/24 0528 09/21/24 0428 09/22/24 0449 09/23/24 0425 09/24/24 0431  INR 3.7* 3.7* 3.2* 2.3* 1.9*   Cardiac Enzymes: No results for input(s): CKTOTAL, CKMB, CKMBINDEX, TROPONINI in the last 168 hours. BNP (last 3 results) Recent Labs    08/02/24 0805 09/18/24 0922  PROBNP 2,402.0* 2,453.0*   HbA1C: No results for input(s): HGBA1C in the last 72 hours. CBG: Recent Labs  Lab 09/23/24 0723 09/23/24 1102 09/23/24 1644 09/23/24 2052 09/24/24 0739  GLUCAP 110* 99 194* 330* 114*   Lipid Profile: No results for input(s): CHOL, HDL, LDLCALC, TRIG, CHOLHDL, LDLDIRECT in the last 72 hours. Thyroid  Function Tests: No results for input(s): TSH, T4TOTAL, FREET4, T3FREE, THYROIDAB in the last 72 hours. Anemia Panel: No results for input(s): VITAMINB12, FOLATE, FERRITIN, TIBC, IRON, RETICCTPCT in the last 72 hours. Sepsis Labs: Recent Labs  Lab 09/18/24 9077  LATICACIDVEN 1.1    Recent Results (from the past 240 hours)  Culture, blood (Routine x 2)     Status: None   Collection Time: 09/18/24  9:22 AM   Specimen: BLOOD RIGHT ARM  Result Value Ref Range Status   Specimen Description   Final    BLOOD RIGHT ARM BOTTLES DRAWN AEROBIC AND ANAEROBIC   Special Requests Blood Culture adequate volume  Final   Culture   Final    NO GROWTH 5 DAYS Performed at [facility], [address], [place], [place] [address]    Report Status 09/23/2024 FINAL  Final  Culture, blood (Routine x 2)     Status: None   Collection Time: 09/18/24  9:22 AM   Specimen: BLOOD RIGHT ARM  Result Value Ref Range Status   Specimen Description   Final    BLOOD RIGHT ARM BOTTLES DRAWN AEROBIC AND ANAEROBIC   Special Requests Blood Culture adequate volume  Final   Culture   Final    NO GROWTH 5  DAYS Performed at [facility], [address], [place], [place] [address]    Report Status 09/23/2024 FINAL  Final  Resp panel by RT-PCR (RSV, Flu A&B,  Covid) Anterior Nasal Swab     Status: None   Collection Time: 09/18/24  9:22 AM   Specimen: Anterior Nasal Swab  Result Value Ref Range Status   SARS Coronavirus 2 by RT PCR NEGATIVE NEGATIVE Final    Comment: (NOTE) SARS-CoV-2 target nucleic acids are NOT DETECTED.  The SARS-CoV-2 RNA is generally detectable in upper respiratory specimens during the acute phase of infection. The lowest concentration of SARS-CoV-2 viral copies this assay can detect is 138 copies/mL. A negative result does not preclude SARS-Cov-2 infection and should not be used as the sole basis for treatment or other patient management decisions. A negative result may occur with  improper specimen collection/handling, submission of specimen other than nasopharyngeal swab, presence of viral mutation(s) within the areas targeted by this assay, and inadequate number of viral copies(<138 copies/mL). A negative result must be combined with clinical observations, patient history, and epidemiological information. The expected result is Negative.  Fact Sheet for Patients:  bloggercourse.com  Fact Sheet for Healthcare Providers:  seriousbroker.it  This test is no t yet approved or cleared by the [place]  FDA and  has been authorized for detection and/or diagnosis of SARS-CoV-2 by FDA under an Emergency Use Authorization (EUA). This EUA will remain  in effect (meaning this test can be used) for the duration of the COVID-19 declaration under Section 564(b)(1) of the Act, 21 U.S.C.section 360bbb-3(b)(1), unless the authorization is terminated  or revoked sooner.       Influenza A by PCR NEGATIVE NEGATIVE Final   Influenza B by PCR NEGATIVE NEGATIVE Final    Comment: (NOTE) The Xpert Xpress SARS-CoV-2/FLU/RSV plus  assay is intended as an aid in the diagnosis of influenza from Nasopharyngeal swab specimens and should not be used as a sole basis for treatment. Nasal washings and aspirates are unacceptable for Xpert Xpress SARS-CoV-2/FLU/RSV testing.  Fact Sheet for Patients: bloggercourse.com  Fact Sheet for Healthcare Providers: seriousbroker.it  This test is not yet approved or cleared by the [place]  FDA and has been authorized for detection and/or diagnosis of SARS-CoV-2 by FDA under an Emergency Use Authorization (EUA). This EUA will remain in effect (meaning this test can be used) for the duration of the COVID-19 declaration under Section 564(b)(1) of the Act, 21 U.S.C. section 360bbb-3(b)(1), unless the authorization is terminated or revoked.     Resp Syncytial Virus by PCR NEGATIVE NEGATIVE Final    Comment: (NOTE) Fact Sheet for Patients: bloggercourse.com  Fact Sheet for Healthcare Providers: seriousbroker.it  This test is not yet approved or cleared by the [place]  FDA and has been authorized for detection and/or diagnosis of SARS-CoV-2 by FDA under an Emergency Use Authorization (EUA). This EUA will remain in effect (meaning this test can be used) for the duration of the COVID-19 declaration under Section 564(b)(1) of the Act, 21 U.S.C. section 360bbb-3(b)(1), unless the authorization is terminated or revoked.  Performed at [facility], [address] [place], [place] [address]   MRSA Next Gen by PCR, Nasal     Status: None   Collection Time: 09/18/24  5:00 PM   Specimen: Nasal Mucosa; Nasal Swab  Result Value Ref Range Status   MRSA by PCR Next Gen NOT DETECTED NOT DETECTED Final    Comment: (NOTE) The GeneXpert MRSA Assay (FDA approved for NASAL specimens only), is one component of a comprehensive MRSA colonization surveillance program. It is not intended to  diagnose MRSA infection nor to guide or monitor treatment for MRSA infections. Test  performance is not FDA approved in patients less than 61 years old. Performed at [facility], [address], [place], [place] [address]   Expectorated Sputum Assessment w Gram Stain, Rflx to Resp Cult     Status: None   Collection Time: 09/23/24  3:30 PM   Specimen: SPU  Result Value Ref Range Status   Specimen Description SPUTUM  Final   Special Requests NONE  Final   Sputum evaluation   Final    THIS SPECIMEN IS ACCEPTABLE FOR SPUTUM CULTURE Performed at [facility], [address], [place], [place] [address]    Report Status 09/23/2024 FINAL  Final  Culture, Respiratory w Gram Stain     Status: None (Preliminary result)   Collection Time: 09/23/24  3:30 PM   Specimen: SPU  Result Value Ref Range Status   Specimen Description   Final    SPUTUM Performed at [facility], [address], [place], [place] [address]    Special Requests   Final    NONE Reflexed from [id number] Performed at [facility], [address], [place], [place] [address]    Gram Stain   Final    FEW WBC PRESENT, PREDOMINANTLY PMN MODERATE GRAM NEGATIVE RODS FEW GRAM POSITIVE COCCI Performed at [facility], [address], [place], [place] [address]    Culture PENDING  Incomplete   Report Status PENDING  Incomplete         Radiology Studies: CT CHEST W CONTRAST Result Date: 09/23/2024 CLINICAL DATA:  Pneumonia, lung cancer.  * Tracking Code: BO * EXAM: CT CHEST WITH CONTRAST TECHNIQUE: Multidetector CT imaging of the chest was performed during intravenous contrast administration. RADIATION DOSE REDUCTION: This exam was performed according to the departmental dose-optimization program which includes automated exposure control, adjustment of the mA and/or kV according to patient size and/or use of iterative reconstruction technique. CONTRAST:  75mL OMNIPAQUE  IOHEXOL  300 MG/ML  SOLN COMPARISON:  08/02/2024. FINDINGS:  Cardiovascular: Left IJ Port-A-Cath terminates in the high right atrium. Atherosclerotic calcification of the aorta, aortic valve and coronary arteries. Enlarged pulmonic trunk and heart. Small pericardial effusion. Mediastinum/Nodes: Thoracic inlet lymph nodes are not enlarged by CT size criteria. No pathologically enlarged mediastinal, hilar or axillary lymph nodes. Soft tissue thickening in the right hilum, as before, treatment related. Air in the esophagus can be seen with dysmotility. There may be mild thickening of the distal esophageal wall which can be seen with gastroesophageal reflux disease. Lungs/Pleura: Centrilobular and paraseptal emphysema. Postoperative scarring and volume loss in the medial right hemithorax with soft tissue thickening in the right suprahilar region (4/49), unchanged. Previously seen area of mixed consolidation and ground-glass in the lateral posterior segment right upper lobe (4/45) has decreased in size in the interval. New and progressive areas of mixed ground-glass and/or consolidation bilaterally. Patchy areas of generalized ground-glass, right greater than left. Trace right and small left pleural effusion. Left upper lobectomy. Post treatment narrowing of the right upper and right middle lobe bronchi. Debris in the airway. Upper Abdomen: Cholecystectomy. Small left adrenal myelolipoma. No specific follow-up necessary. Small low-attenuation lesion in the upper pole left kidney. No specific follow-up necessary. Visualized portions of the liver, adrenal glands, kidneys, spleen, pancreas, stomach and bowel are otherwise grossly unremarkable. No upper abdominal adenopathy. Musculoskeletal: Degenerative changes in the spine. Osteopenia. No worrisome lytic or sclerotic lesions. IMPRESSION: 1. Postoperative and post treatment scarring in the medial right hemithorax, stable. 2. Waxing and waning areas of ground-glass and/or consolidation, indicative of  an ongoing atypical or fungal  infectious process. 3. Small pericardial effusion. 4. Trace right and small left pleural effusions. 5. Small left adrenal myelolipoma. 6. Aortic atherosclerosis (ICD10-I70.0). Coronary artery calcification. 7. Enlarged pulmonic trunk, indicative of pulmonary arterial hypertension. 8.  Emphysema (ICD10-J43.9). Electronically Signed   By: Newell Eke M.D.   On: 09/23/2024 13:02   DG CHEST PORT 1 VIEW Result Date: 09/23/2024 EXAM: 1 VIEW(S) XRAY OF THE CHEST 09/23/2024 04:52:29 AM COMPARISON: AP and lateral chest 09/21/2024. CLINICAL HISTORY: Hypoxia 200808 Hypoxia. FINDINGS: LINES, TUBES AND DEVICES: Left IJ port catheter terminates at the superior cavoatrial junction. LUNGS AND PLEURA: The lungs are emphysematous with bilateral postsurgical and scarring changes again noted. Patchy airspace infiltrate continues in the right mid to lower lung field with a small right pleural effusion. No other focal infiltrate seen. No new abnormality. No pneumothorax. HEART AND MEDIASTINUM: There is aortic atherosclerosis. Mild cardiomegaly. No acute abnormality of the mediastinal and cardiac silhouette. BONES AND SOFT TISSUES: No acute osseous abnormality. IMPRESSION: 1. Patchy airspace infiltrate in the right mid to lower lung field with a small right pleural effusion. Overall aeration seems unchanged. 2. Emphysematous lungs with bilateral postsurgical and scarring changes. 3. Mild cardiomegaly. Electronically signed by: Francis Quam MD 09/23/2024 05:05 AM EST RP Workstation: [id number]           LOS: 6 days   Time spent= 35 mins    Deliliah Room, MD [facility]  If 7PM-7AM, please contact night-coverage  09/24/2024, 8:49 AM  "

## 2024-09-25 DIAGNOSIS — J9621 Acute and chronic respiratory failure with hypoxia: Secondary | ICD-10-CM | POA: Diagnosis not present

## 2024-09-25 LAB — GLUCOSE, CAPILLARY
Glucose-Capillary: 115 mg/dL — ABNORMAL HIGH (ref 70–99)
Glucose-Capillary: 232 mg/dL — ABNORMAL HIGH (ref 70–99)
Glucose-Capillary: 232 mg/dL — ABNORMAL HIGH (ref 70–99)
Glucose-Capillary: 282 mg/dL — ABNORMAL HIGH (ref 70–99)
Glucose-Capillary: 246 mg/dL — ABNORMAL HIGH (ref 70–99)

## 2024-09-25 LAB — PROTIME-INR
INR: 1.7 — ABNORMAL HIGH (ref 0.8–1.2)
Prothrombin Time: 20.5 s — ABNORMAL HIGH (ref 11.4–15.2)

## 2024-09-25 MED ORDER — WARFARIN SODIUM 5 MG PO TABS
5.0000 mg | ORAL_TABLET | Freq: Once | ORAL | Status: AC
  Administered 2024-09-25: 5 mg via ORAL
  Filled 2024-09-25: qty 1

## 2024-09-25 NOTE — Progress Notes (Addendum)
 " PROGRESS NOTE    Randall Sullivan  FMW:[id number] DOB: 1950/12/30 DOA: 09/18/2024 PCP: Tobie Suzzane POUR, MD   Brief Narrative:    73 y.o. male with medical history significant of non-small cell lung cancer, COPD, chronic respiratory failure on 2-3 L supplementation, paroxysmal atrial fibrillation (on Coumadin ), AAA, hypertension, hyperlipidemia, prediabetes, BPH, and prior history of CVA; who presented to the hospital secondary to shortness of breath and worsening mildly productive coughing spells.   Admitted for management of acute chronic hypoxic respiratory failure comfortably as well as COPD exacerbation.  Lives at home with his wife and uses 2 to 3 L oxygen  at baseline.  Assessment & Plan:  Principal Problem:   Acute on chronic respiratory failure with hypoxia (HCC) Active Problems:   Small cell lung cancer (HCC)   Essential hypertension   Mixed hyperlipidemia   Type 2 diabetes mellitus (HCC)   AAA (abdominal aortic aneurysm)   Atrial fibrillation (HCC)   COPD with acute exacerbation (HCC)   Anxiety and depression   GERD (gastroesophageal reflux disease)   BPH (benign prostatic hyperplasia)   Elevated brain natriuretic peptide (BNP) level   CVA (cerebral vascular accident) (HCC)   Acute on chronic hypoxic respiratory failure, POA: Needing 3-4 L oxygen  now.  At home, he is on 2 to 3 L oxygen .  This in the setting of COPD exacerbation, immune therapy induced pneumonitis as well as possible bacterial community-acquired pneumonia.  Continue with antibiotics, nebulization treatments, mucolytics, supportive care as well as flutter valve. -Oxygen  saturation drops with minimal exertion.  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 -Discussed with Dr Theophilus, PCCM at [facility] on 09/23/24 and he agreed with the plan of increasing the dose of prednisone  and  having goals of care discussion with the patient and his wife. - Palliative consult placed- Patient was made DNR/DNI -Hospice will be consulted.  Possible bacterial community-acquired pneumonia, POA: Continue with intravenous antibiotics.  Tested negative for COVID-19, RSV and influenza panel. -Reviewed CT Chest with contrast today and it showed extensive ground glass opacities and severe emphysema. -No indication for bronchoscopy.  Abdominal aortic aneurysm, POA: No history of rupture.  Outpatient follow-up.  Denies any abdominal pain or chest pain.  Atrial fibrillation, paroxysmal, POA: Status post cardioversion.  Continue with Cardizem  and metoprolol .  Continue with Coumadin  for stroke prophylaxis.  Continue to monitor on telemetry closely  Acute on chronic COPD exacerbation, POA: Continue with initial bronchodilator therapy, steroids, mucolytic's as well as flutter valve.  Outpatient follow-up with Pulmonology.  Anxiety, major moderate depression, POA: Continue with BuSpar  as well as Atarax .  GERD, POA: Continue PPI  CVA, POA: No acute issues.  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  Follows up outpatient with oncology, Dr Davonna. There is a concern for immunotherapy related lung damage so immunotherapy was stopped. S/p left upper lobectomy Palliative consulted.   Essential hypertension, POA: Continue with home medications  Hyperlipidemia, POA: Continue with statin  Chronic diastolic CHF, POA: NYHA class III.   fluid restriction of 1.5 L/day, strict intake output monitoring and heart healthy diet. Transitioned to oral Lasix  40 mg daily on 09/22/2024.  Prior to that, he was on 40 mg intravenous twice daily  BPH, POA: Continue with Flomax   Diabetes mellitus type 2, POA: Continue monitor blood glucose level  closely.  Most recent hemoglobin A1c is 5.4%  Disposition: Home.  Lives  at home with his wife.may need hospice on discharge.   DVT prophylaxis: Place TED hose Start: 09/19/24 1654     Code Status: Limited: Do not attempt resuscitation (DNR) -DNR-LIMITED -Do Not Intubate/DNI  Family Communication: Wife is present at the bedside Status is: Inpatient Remains inpatient appropriate because: Acute on chronic hypoxic aspiratory failure, COPD, pneumonia    Subjective:  He feels stable. Needing 4 L oxygen . Wife is at the bedside, She told me about her discussion with palliative yesterday and patient opted to be DNR/DNI. She is interested in hospice at home as patient wants to go home on discharge. We spoke about continuing the current dose of oral prednisone  and assess clinical response closely.  Examination:  General exam: Appears to be in mild distress, nasal oxygen  in place at 4 L Respiratory system: Slightly coarse breath sounds bilaterally with occasional expiratory wheezing Cardiovascular system: S1 & S2 heard, RRR. No JVD, murmurs, rubs, gallops or clicks. No pedal edema. Gastrointestinal system: Abdomen is nondistended, soft and nontender. No organomegaly or masses felt. Normal bowel sounds heard. Central nervous system: Alert and oriented. No focal neurological deficits. Extremities: Symmetric 5 x 5 power. Skin: No rashes, lesions or ulcers Psychiatry: Judgement and insight appear normal. Mood & affect appropriate.       Diet Orders (From admission, onward)     Start     Ordered   09/18/24 1204  Diet heart healthy/carb modified Room service appropriate? Yes; Fluid consistency: Thin  Diet effective now       Question Answer Comment  Diet-HS Snack? Nothing   Room service appropriate? Yes   Fluid consistency: Thin      09/18/24 1204            Objective: Vitals:   09/24/24 1928 09/24/24 2125 09/25/24 0132 09/25/24 0533  BP:  134/73  (!) 141/84  Pulse:  81  84  Resp:  20  20  Temp:  97.6 F (36.4 C)  97.6 F (36.4 C)  TempSrc:  Oral   Oral  SpO2: 97% 94% 96% 99%  Weight:    88.9 kg  Height:        Intake/Output Summary (Last 24 hours) at 09/25/2024 0818 Last data filed at 09/25/2024 0549 Gross per 24 hour  Intake 120 ml  Output 2250 ml  Net -2130 ml   Filed Weights   09/23/24 0512 09/24/24 0603 09/25/24 0533  Weight: 91.7 kg 90.6 kg 88.9 kg    Scheduled Meds:  arformoterol   15 mcg Nebulization BID   azithromycin   250 mg Oral Daily   budesonide  (PULMICORT ) nebulizer solution  0.5 mg Nebulization BID   busPIRone   5 mg Oral TID   dextromethorphan -guaiFENesin   1 tablet Oral BID   diltiazem   120 mg Oral Daily   feeding supplement  237 mL Oral BID BM   furosemide   40 mg Oral Daily   insulin  aspart  0-5 Units Subcutaneous QHS   insulin  aspart  0-9 Units Subcutaneous TID WC   ipratropium-albuterol   3 mL Nebulization TID   loratadine   10 mg Oral Daily   metoprolol  succinate  100 mg Oral Daily   pantoprazole   40 mg Oral BID   predniSONE   40 mg Oral BID WC   rosuvastatin   40 mg Oral Daily   tamsulosin   0.8 mg Oral Daily   Warfarin - Pharmacist Dosing Inpatient   Does not apply q1600  Continuous Infusions:  cefTRIAXone  (ROCEPHIN )  IV 1 g (09/24/24 0944)    Nutritional status     Body mass index is 28.11 kg/m.  Data Reviewed:   CBC: Recent Labs  Lab 09/18/24 0922 09/19/24 0354  WBC 8.4 8.3  NEUTROABS 6.5  --   HGB 10.4* 9.6*  HCT 36.4* 33.4*  MCV 81.8 81.1  PLT 195 189   Basic Metabolic Panel: Recent Labs  Lab 09/18/24 0922 09/18/24 1204 09/19/24 0354 09/22/24 0449  NA 144  --  144 144  K 4.3  --  4.4 4.1  CL 103  --  102 98  CO2 38*  --  37* 36*  GLUCOSE 104*  --  164* 151*  BUN 16  --  22 36*  CREATININE 0.89  --  0.87 0.87  CALCIUM  9.4  --  9.2 9.2  MG  --  1.9  --   --   PHOS  --  2.5  --   --    GFR: Estimated Creatinine Clearance: 83.7 mL/min (by C-G formula based on SCr of 0.87 mg/dL). Liver Function Tests: Recent Labs  Lab 09/18/24 0922  AST 20  ALT 16  ALKPHOS 48   BILITOT 0.8  PROT 6.7  ALBUMIN  4.0   No results for input(s): LIPASE, AMYLASE in the last 168 hours. No results for input(s): AMMONIA in the last 168 hours. Coagulation Profile: Recent Labs  Lab 09/21/24 0428 09/22/24 0449 09/23/24 0425 09/24/24 0431 09/25/24 0612  INR 3.7* 3.2* 2.3* 1.9* 1.7*   Cardiac Enzymes: No results for input(s): CKTOTAL, CKMB, CKMBINDEX, TROPONINI in the last 168 hours. BNP (last 3 results) Recent Labs    08/02/24 0805 09/18/24 0922  PROBNP 2,402.0* 2,453.0*   HbA1C: No results for input(s): HGBA1C in the last 72 hours. CBG: Recent Labs  Lab 09/24/24 0739 09/24/24 1149 09/24/24 1650 09/24/24 2148 09/25/24 0741  GLUCAP 114* 185* 184* 189* 115*   Lipid Profile: No results for input(s): CHOL, HDL, LDLCALC, TRIG, CHOLHDL, LDLDIRECT in the last 72 hours. Thyroid  Function Tests: No results for input(s): TSH, T4TOTAL, FREET4, T3FREE, THYROIDAB in the last 72 hours. Anemia Panel: No results for input(s): VITAMINB12, FOLATE, FERRITIN, TIBC, IRON, RETICCTPCT in the last 72 hours. Sepsis Labs: Recent Labs  Lab 09/18/24 9077  LATICACIDVEN 1.1    Recent Results (from the past 240 hours)  Culture, blood (Routine x 2)     Status: None   Collection Time: 09/18/24  9:22 AM   Specimen: BLOOD RIGHT ARM  Result Value Ref Range Status   Specimen Description   Final    BLOOD RIGHT ARM BOTTLES DRAWN AEROBIC AND ANAEROBIC   Special Requests Blood Culture adequate volume  Final   Culture   Final    NO GROWTH 5 DAYS Performed at [facility], [address] [place], [place] [address]    Report Status 09/23/2024 FINAL  Final  Culture, blood (Routine x 2)     Status: None   Collection Time: 09/18/24  9:22 AM   Specimen: BLOOD RIGHT ARM  Result Value Ref Range Status   Specimen Description   Final    BLOOD RIGHT ARM BOTTLES DRAWN AEROBIC AND ANAEROBIC   Special Requests Blood Culture adequate volume   Final   Culture   Final    NO GROWTH 5 DAYS Performed at [facility], [address], [place], [place] [address]    Report Status 09/23/2024 FINAL  Final  Resp panel by RT-PCR (RSV, Flu A&B,  Covid) Anterior Nasal Swab     Status: None   Collection Time: 09/18/24  9:22 AM   Specimen: Anterior Nasal Swab  Result Value Ref Range Status   SARS Coronavirus 2 by RT PCR NEGATIVE NEGATIVE Final    Comment: (NOTE) SARS-CoV-2 target nucleic acids are NOT DETECTED.  The SARS-CoV-2 RNA is generally detectable in upper respiratory specimens during the acute phase of infection. The lowest concentration of SARS-CoV-2 viral copies this assay can detect is 138 copies/mL. A negative result does not preclude SARS-Cov-2 infection and should not be used as the sole basis for treatment or other patient management decisions. A negative result may occur with  improper specimen collection/handling, submission of specimen other than nasopharyngeal swab, presence of viral mutation(s) within the areas targeted by this assay, and inadequate number of viral copies(<138 copies/mL). A negative result must be combined with clinical observations, patient history, and epidemiological information. The expected result is Negative.  Fact Sheet for Patients:  bloggercourse.com  Fact Sheet for Healthcare Providers:  seriousbroker.it  This test is no t yet approved or cleared by the [place]  FDA and  has been authorized for detection and/or diagnosis of SARS-CoV-2 by FDA under an Emergency Use Authorization (EUA). This EUA will remain  in effect (meaning this test can be used) for the duration of the COVID-19 declaration under Section 564(b)(1) of the Act, 21 U.S.C.section 360bbb-3(b)(1), unless the authorization is terminated  or revoked sooner.       Influenza A by PCR NEGATIVE NEGATIVE Final   Influenza B by PCR NEGATIVE NEGATIVE Final    Comment:  (NOTE) The Xpert Xpress SARS-CoV-2/FLU/RSV plus assay is intended as an aid in the diagnosis of influenza from Nasopharyngeal swab specimens and should not be used as a sole basis for treatment. Nasal washings and aspirates are unacceptable for Xpert Xpress SARS-CoV-2/FLU/RSV testing.  Fact Sheet for Patients: bloggercourse.com  Fact Sheet for Healthcare Providers: seriousbroker.it  This test is not yet approved or cleared by the [place]  FDA and has been authorized for detection and/or diagnosis of SARS-CoV-2 by FDA under an Emergency Use Authorization (EUA). This EUA will remain in effect (meaning this test can be used) for the duration of the COVID-19 declaration under Section 564(b)(1) of the Act, 21 U.S.C. section 360bbb-3(b)(1), unless the authorization is terminated or revoked.     Resp Syncytial Virus by PCR NEGATIVE NEGATIVE Final    Comment: (NOTE) Fact Sheet for Patients: bloggercourse.com  Fact Sheet for Healthcare Providers: seriousbroker.it  This test is not yet approved or cleared by the [place]  FDA and has been authorized for detection and/or diagnosis of SARS-CoV-2 by FDA under an Emergency Use Authorization (EUA). This EUA will remain in effect (meaning this test can be used) for the duration of the COVID-19 declaration under Section 564(b)(1) of the Act, 21 U.S.C. section 360bbb-3(b)(1), unless the authorization is terminated or revoked.  Performed at [facility], [address] [place], [place] [address]   MRSA Next Gen by PCR, Nasal     Status: None   Collection Time: 09/18/24  5:00 PM   Specimen: Nasal Mucosa; Nasal Swab  Result Value Ref Range Status   MRSA by PCR Next Gen NOT DETECTED NOT DETECTED Final    Comment: (NOTE) The GeneXpert MRSA Assay (FDA approved for NASAL specimens only), is one component of a comprehensive MRSA  colonization surveillance program. It is not intended to diagnose MRSA infection nor to guide or monitor treatment for MRSA infections. Test  performance is not FDA approved in patients less than 66 years old. Performed at [facility], [address], [place], [place] [address]   Expectorated Sputum Assessment w Gram Stain, Rflx to Resp Cult     Status: None   Collection Time: 09/23/24  3:30 PM   Specimen: SPU  Result Value Ref Range Status   Specimen Description SPUTUM  Final   Special Requests NONE  Final   Sputum evaluation   Final    THIS SPECIMEN IS ACCEPTABLE FOR SPUTUM CULTURE Performed at [facility], [address], [place], [place] [address]    Report Status 09/23/2024 FINAL  Final  Culture, Respiratory w Gram Stain     Status: None (Preliminary result)   Collection Time: 09/23/24  3:30 PM   Specimen: SPU  Result Value Ref Range Status   Specimen Description   Final    SPUTUM Performed at [facility], [address] [place], [place] [address]    Special Requests   Final    NONE Reflexed from [id number] Performed at [facility], [address] [place], [place] [address]    Gram Stain   Final    FEW WBC PRESENT, PREDOMINANTLY PMN MODERATE GRAM NEGATIVE RODS FEW GRAM POSITIVE COCCI    Culture   Final    CULTURE REINCUBATED FOR BETTER GROWTH Performed at [facility], [address], [place], [place] [address]    Report Status PENDING  Incomplete         Radiology Studies: CT CHEST W CONTRAST Result Date: 09/23/2024 CLINICAL DATA:  Pneumonia, lung cancer.  * Tracking Code: BO * EXAM: CT CHEST WITH CONTRAST TECHNIQUE: Multidetector CT imaging of the chest was performed during intravenous contrast administration. RADIATION DOSE REDUCTION: This exam was performed according to the departmental dose-optimization program which includes automated exposure control, adjustment of the mA and/or kV according to patient size and/or use of iterative reconstruction  technique. CONTRAST:  75mL OMNIPAQUE  IOHEXOL  300 MG/ML  SOLN COMPARISON:  08/02/2024. FINDINGS: Cardiovascular: Left IJ Port-A-Cath terminates in the high right atrium. Atherosclerotic calcification of the aorta, aortic valve and coronary arteries. Enlarged pulmonic trunk and heart. Small pericardial effusion. Mediastinum/Nodes: Thoracic inlet lymph nodes are not enlarged by CT size criteria. No pathologically enlarged mediastinal, hilar or axillary lymph nodes. Soft tissue thickening in the right hilum, as before, treatment related. Air in the esophagus can be seen with dysmotility. There may be mild thickening of the distal esophageal wall which can be seen with gastroesophageal reflux disease. Lungs/Pleura: Centrilobular and paraseptal emphysema. Postoperative scarring and volume loss in the medial right hemithorax with soft tissue thickening in the right suprahilar region (4/49), unchanged. Previously seen area of mixed consolidation and ground-glass in the lateral posterior segment right upper lobe (4/45) has decreased in size in the interval. New and progressive areas of mixed ground-glass and/or consolidation bilaterally. Patchy areas of generalized ground-glass, right greater than left. Trace right and small left pleural effusion. Left upper lobectomy. Post treatment narrowing of the right upper and right middle lobe bronchi. Debris in the airway. Upper Abdomen: Cholecystectomy. Small left adrenal myelolipoma. No specific follow-up necessary. Small low-attenuation lesion in the upper pole left kidney. No specific follow-up necessary. Visualized portions of the liver, adrenal glands, kidneys, spleen, pancreas, stomach and bowel are otherwise grossly unremarkable. No upper abdominal adenopathy. Musculoskeletal: Degenerative changes in the spine. Osteopenia. No worrisome lytic or sclerotic lesions. IMPRESSION: 1. Postoperative and post treatment scarring in the medial right hemithorax, stable. 2. Waxing  and  waning areas of ground-glass and/or consolidation, indicative of an ongoing atypical or fungal infectious process. 3. Small pericardial effusion. 4. Trace right and small left pleural effusions. 5. Small left adrenal myelolipoma. 6. Aortic atherosclerosis (ICD10-I70.0). Coronary artery calcification. 7. Enlarged pulmonic trunk, indicative of pulmonary arterial hypertension. 8.  Emphysema (ICD10-J43.9). Electronically Signed   By: Newell Eke M.D.   On: 09/23/2024 13:02           LOS: 7 days   Time spent= 35 mins    Deliliah Room, MD [facility]  If 7PM-7AM, please contact night-coverage  09/25/2024, 8:18 AM  "

## 2024-09-25 NOTE — Plan of Care (Signed)
" °  Problem: Education: Goal: Knowledge of General Education information will improve Description: Including pain rating scale, medication(s)/side effects and non-pharmacologic comfort measures Outcome: Progressing   Problem: Health Behavior/Discharge Planning: Goal: Ability to manage health-related needs will improve Outcome: Progressing   Problem: Clinical Measurements: Goal: Ability to maintain clinical measurements within normal limits will improve Outcome: Progressing Goal: Will remain free from infection Outcome: Progressing Goal: Diagnostic test results will improve Outcome: Progressing Goal: Respiratory complications will improve Outcome: Progressing   Problem: Activity: Goal: Risk for activity intolerance will decrease Outcome: Progressing   Problem: Nutrition: Goal: Adequate nutrition will be maintained Outcome: Progressing   Problem: Coping: Goal: Level of anxiety will decrease Outcome: Progressing   Problem: Elimination: Goal: Will not experience complications related to bowel motility Outcome: Progressing Goal: Will not experience complications related to urinary retention Outcome: Progressing   Problem: Pain Managment: Goal: General experience of comfort will improve and/or be controlled Outcome: Progressing   Problem: Safety: Goal: Ability to remain free from injury will improve Outcome: Progressing   Problem: Education: Goal: Ability to describe self-care measures that may prevent or decrease complications (Diabetes Survival Skills Education) will improve Outcome: Progressing Goal: Individualized Educational Video(s) Outcome: Progressing   Problem: Coping: Goal: Ability to adjust to condition or change in health will improve Outcome: Progressing   Problem: Health Behavior/Discharge Planning: Goal: Ability to identify and utilize available resources and services will improve Outcome: Progressing Goal: Ability to manage health-related needs will  improve Outcome: Progressing   Problem: Metabolic: Goal: Ability to maintain appropriate glucose levels will improve Outcome: Progressing   Problem: Nutritional: Goal: Maintenance of adequate nutrition will improve Outcome: Progressing Goal: Progress toward achieving an optimal weight will improve Outcome: Progressing   Problem: Skin Integrity: Goal: Risk for impaired skin integrity will decrease Outcome: Progressing   Problem: Tissue Perfusion: Goal: Adequacy of tissue perfusion will improve Outcome: Progressing   "

## 2024-09-25 NOTE — Progress Notes (Signed)
 PHARMACY - ANTICOAGULATION CONSULT NOTE  Pharmacy Consult for warfarin Indication: atrial fibrillation  Allergies[1]  Patient Measurements: Height: 5' 10 (177.8 cm) Weight: 88.9 kg (195 lb 14.4 oz) IBW/kg (Calculated) : 73 HEPARIN  DW (KG): 91.9  Vital Signs: Temp: 97.6 F (36.4 C) (12/27 0533) Temp Source: Oral (12/27 0533) BP: 141/84 (12/27 0533) Pulse Rate: 84 (12/27 0533)  Labs: Recent Labs    09/23/24 0425 09/24/24 0431 09/25/24 0612  LABPROT 26.0* 23.0* 20.5*  INR 2.3* 1.9* 1.7*    Estimated Creatinine Clearance: 83.7 mL/min (by C-G formula based on SCr of 0.87 mg/dL).   Medical History: Past Medical History:  Diagnosis Date   A-fib (HCC) 2017   after last lung surgery patient had a history if Afib documented in hospital    AAA (abdominal aortic aneurysm)    4.0 cm 09/17/22 US    Acute appendicitis 07/14/2021   Anxiety and depression 09/18/2024   Arthritis    back & legs ( knees)   Cancer (HCC)    lung   Complication of anesthesia    agitated upon waking from anesth.    Dyspnea    Dysrhythmia    A. Fib   Emphysema    Emphysema of lung (HCC)    GERD (gastroesophageal reflux disease) 09/18/2024   History of kidney stones    Hyperlipidemia    Hypertension    Oxygen  dependent    2L continuous   Peripheral vascular disease    Aortic Aneurysm   Pneumonia    Pre-diabetes    Sleep apnea    No OSA, but wears 2L [place] O2 continuous including at night   Stroke [facility]) 2022   Right sided weakness, decreased hearing, balance and memory loss   Tobacco abuse     Assessment: Pharmacy consulted to dose warfarin in patient with atrial fibrillation.  Patient's home dose listed as 5 mg on Mon + Fri and 2.5 mg ROW.  INR on admission is therapeutic at 2.6.  INR 2.6 > 3.5>3.7> 3.7>3.2>2.3>1.9>1.7 today, below goal.  Hgb 9.6 on 12/21  Goal of Therapy:  INR 2-3 Monitor platelets by anticoagulation protocol: Yes   Plan:  Warfarin 5mg  tonight Monitor daily INR  and s/s of bleeding  Dempsey Blush PharmD., BCPS Clinical Pharmacist 09/25/2024 10:51 AM       [1] No Active Allergies

## 2024-09-25 NOTE — Progress Notes (Addendum)
 Pt c/o feeling shakey and weak. CBG results 115 mg/dl, skin warm and dry, pt A&O. Ensure given per pt request. MD Rashid in to eval pt and speak with pt and family. No needs expressed at this time.  Queried pt and family regarding last BM, both state pt had BM on 09/23/2026 and denies need for stool softner or laxative.

## 2024-09-25 NOTE — Plan of Care (Signed)
" °  Problem: Education: Goal: Knowledge of General Education information will improve Description: Including pain rating scale, medication(s)/side effects and non-pharmacologic comfort measures Outcome: Progressing   Problem: Clinical Measurements: Goal: Ability to maintain clinical measurements within normal limits will improve Outcome: Progressing Goal: Diagnostic test results will improve Outcome: Progressing Goal: Respiratory complications will improve Outcome: Progressing   Problem: Nutrition: Goal: Adequate nutrition will be maintained Outcome: Progressing   Problem: Coping: Goal: Level of anxiety will decrease Outcome: Progressing   Problem: Elimination: Goal: Will not experience complications related to bowel motility Outcome: Progressing Goal: Will not experience complications related to urinary retention Outcome: Progressing   Problem: Pain Managment: Goal: General experience of comfort will improve and/or be controlled Outcome: Progressing   Problem: Safety: Goal: Ability to remain free from injury will improve Outcome: Progressing   Problem: Coping: Goal: Ability to adjust to condition or change in health will improve Outcome: Progressing   Problem: Metabolic: Goal: Ability to maintain appropriate glucose levels will improve Outcome: Progressing   Problem: Skin Integrity: Goal: Risk for impaired skin integrity will decrease Outcome: Progressing   Problem: Tissue Perfusion: Goal: Adequacy of tissue perfusion will improve Outcome: Progressing   "

## 2024-09-25 NOTE — Progress Notes (Signed)
 Pt has had fair day. Up in recliner this am for breakfast, did have exertional dyspnea that resolved with rest. Remains on 4 lpm [place] with SaO2 > 92%, Fair appetite today, tolerating liquids without difficulty. No c/o pain. Family has been at bedside most of the day .

## 2024-09-26 DIAGNOSIS — J984 Other disorders of lung: Secondary | ICD-10-CM | POA: Diagnosis not present

## 2024-09-26 DIAGNOSIS — J441 Chronic obstructive pulmonary disease with (acute) exacerbation: Secondary | ICD-10-CM | POA: Diagnosis not present

## 2024-09-26 DIAGNOSIS — I480 Paroxysmal atrial fibrillation: Secondary | ICD-10-CM | POA: Diagnosis not present

## 2024-09-26 DIAGNOSIS — N400 Enlarged prostate without lower urinary tract symptoms: Secondary | ICD-10-CM | POA: Diagnosis not present

## 2024-09-26 DIAGNOSIS — J9621 Acute and chronic respiratory failure with hypoxia: Secondary | ICD-10-CM | POA: Diagnosis not present

## 2024-09-26 DIAGNOSIS — C3490 Malignant neoplasm of unspecified part of unspecified bronchus or lung: Secondary | ICD-10-CM | POA: Diagnosis not present

## 2024-09-26 LAB — GLUCOSE, CAPILLARY
Glucose-Capillary: 135 mg/dL — ABNORMAL HIGH (ref 70–99)
Glucose-Capillary: 170 mg/dL — ABNORMAL HIGH (ref 70–99)
Glucose-Capillary: 256 mg/dL — ABNORMAL HIGH (ref 70–99)
Glucose-Capillary: 371 mg/dL — ABNORMAL HIGH (ref 70–99)

## 2024-09-26 LAB — LEGIONELLA PNEUMOPHILA SEROGP 1 UR AG: L. pneumophila Serogp 1 Ur Ag: NEGATIVE

## 2024-09-26 LAB — PROTIME-INR
INR: 1.9 — ABNORMAL HIGH (ref 0.8–1.2)
Prothrombin Time: 22.9 s — ABNORMAL HIGH (ref 11.4–15.2)

## 2024-09-26 MED ORDER — WARFARIN SODIUM 5 MG PO TABS
6.0000 mg | ORAL_TABLET | Freq: Once | ORAL | Status: AC
  Administered 2024-09-26: 6 mg via ORAL
  Filled 2024-09-26: qty 1

## 2024-09-26 NOTE — Progress Notes (Addendum)
 Randall Sullivan [address] [facility] Liaison Note   Received request from [place], [place] for hospice services at home after discharge. Per her request, spoke with patient's wife [name] to initiate education related to hospice philosophy, services, and team approach to care. Patient/family verbalized understanding of information given. Per discussion, the plan is for discharge home by PTAR/EMS, likely Tuesday.   DME needs discussed. Patient has the following equipment in the home:O2, [name],  Shower chair   Patient/family requests the following equipment for delivery: hospital bed (half rails)  Nebulizer, Bedside Commode.   The address has been verified and is correct in the chart. [name] [phone] is the requested contact to arrange time of equipment delivery.   Please send signed and completed DNR home with patient/family. Please provide prescriptions at discharge as needed to ensure ongoing symptom management.   [facility] information and contact numbers given to: wife [name]. Above information shared with [name], ICM.   Please call with any questions or concerns. Thank you for the opportunity to participate in this patient's care.   Nat Babe, BSN, RN [employer] [phone]

## 2024-09-26 NOTE — TOC Progression Note (Signed)
 Transition of Care [facility]) - Progression Note    Patient Details  Name: Randall Sullivan MRN: [id number] Date of Birth: 1950-11-08  Transition of Care [facility]) CM/SW Contact  [name] MARLA Sil, RN Phone Number: 09/26/2024, 11:46 AM  Clinical Narrative:    Dr Deliliah notified CM of patient & family's request to transition home with Hospice.  CM contacted patient's wife - [name] to review Home Hospice options and she indicated she was familiar with [facility], stating they cared for her Dad prior to his passing a few years ago.  CM sent SecureChat msg to [facility] liaison - Nat notifying her of family's choice, she indicated she would follow-up with family shortly.  No additional discharge needs identified at this time, however CM team will continue to follow along and assist as appropriate.   Expected Discharge Plan: Home w Home Health Services Barriers to Discharge: Continued Medical Work up  Expected Discharge Plan and Services     Post Acute Care Choice: Home Health Living arrangements for the past 2 months: Single Family Home DME Agency: [facility] HH Arranged: PT, RN HH Agency: [facility] Date HH Agency Contacted: 09/20/24 Representative spoke with at [facility] Agency: [place] with [facility]   Social Drivers of Health (SDOH) Interventions SDOH Screenings   Food Insecurity: No Food Insecurity (09/18/2024)  Housing: Low Risk (09/18/2024)  Transportation Needs: No Transportation Needs (09/18/2024)  Utilities: Not At Risk (09/18/2024)  Depression (PHQ2-9): Low Risk (08/24/2024)  Financial Resource Strain: Low Risk (08/23/2024)  Physical Activity: Insufficiently Active (08/23/2024)  Social Connections: Socially Isolated (09/18/2024)  Stress: No Stress Concern Present (08/23/2024)  Tobacco Use: Medium Risk (09/18/2024)    Readmission Risk Interventions    09/20/2024   11:50 AM 08/05/2024    9:12 AM 08/03/2024    3:21 PM  Readmission Risk  Prevention Plan  Transportation Screening Complete Complete Complete  PCP or Specialist Appt within 5-7 Days   Complete  PCP or Specialist Appt within 3-5 Days Complete    Home Care Screening   Complete  Medication Review (RN CM)   Complete  HRI or Home Care Consult Complete Complete   Social Work Consult for Recovery Care Planning/Counseling  Complete   Palliative Care Screening  Not Applicable   Medication Review Ocean[employer]) Complete Complete

## 2024-09-26 NOTE — Progress Notes (Signed)
 " Progress Note   Patient: Randall Sullivan FMW:[id number] DOB: November 19, 1950 DOA: 09/18/2024  DOS: the patient was seen and examined on 09/26/2024   Brief hospital course:  74 y.o. male with medical history significant of non-small cell lung cancer, COPD, chronic respiratory failure on 2-3 L supplementation, paroxysmal atrial fibrillation (on Coumadin ), AAA, hypertension, hyperlipidemia, prediabetes, BPH, and prior history of CVA; who presented to the hospital secondary to shortness of breath and worsening mildly productive coughing spells.  Admitted for management of acute chronic hypoxic respiratory failure comfortably as well as COPD exacerbation.  Assessment and Plan:  Acute on chronic hypoxic respiratory failure - Currently weaned down to 3 L nasal cannula which is around patient's baseline of 2-3 L.  Multifactorial etiology given underlying COPD exacerbation, immunotherapy induced pneumonitis, possible CAP.  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  Showing improvement.  Palliative consult placed.  Patient is DNR/DNI.  Possible commune acquired pneumonia - Negative for COVID/RSV/flu.  Given extensive ground glass opacities and severe emphysema, receiving empiric broad-spectrum antibiotics.  No indication for bronchoscopy.  Acute COPD exacerbation - As noted above, CT confirming severe emphysema.  Mucolytics, steroids, nebulizers.  Paroxysmal atrial fibrillation - S/p cardioversion.  Continue Cardizem  and metoprolol .  Warfarin on board.  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  Follows with oncology in the outpatient setting.  Palliative care consulted.  BPH - Flomax  on board.  Chronic HFpEF - Currently on p.o. Lasix  40 mg daily.  Goals of care - Given patient's lung cancer, worsening chronic hypoxia, immunotherapy  pneumonitis, discussions about ongoing palliative/hospice care.  Working closely with TOC, family, patient on disposition planning.   Subjective: Patient resting comfortably this morning.  States he feels about at baseline.  Denies any fever, worsening shortness of breath, chest pain, nausea, vomiting, abdominal pain.  Physical Exam:  Vitals:   09/26/24 0630 09/26/24 0631 09/26/24 0632 09/26/24 0900  BP:    132/85  Pulse:    86  Resp:      Temp:      TempSrc:      SpO2: 97% 97% 97%   Weight:      Height:        GENERAL:  Alert, pleasant, no acute distress, disheveled HEENT:  EOMI, nasal cannula CARDIOVASCULAR:  RRR, no murmurs appreciated RESPIRATORY: Poor air movement bilaterally GASTROINTESTINAL:  Soft, nontender, nondistended EXTREMITIES:  No LE edema bilaterally NEURO:  No new focal deficits appreciated SKIN:  No rashes noted PSYCH:  Appropriate mood and affect     Data Reviewed:  Imaging Studies: CT CHEST W CONTRAST Result Date: 09/23/2024 CLINICAL DATA:  Pneumonia, lung cancer.  * Tracking Code: BO * EXAM: CT CHEST WITH CONTRAST TECHNIQUE: Multidetector CT imaging of the chest was performed during intravenous contrast administration. RADIATION DOSE REDUCTION: This exam was performed according to the departmental dose-optimization program which includes automated exposure control, adjustment of the mA and/or kV according to patient size and/or use of iterative reconstruction technique. CONTRAST:  75mL OMNIPAQUE  IOHEXOL  300 MG/ML  SOLN COMPARISON:  08/02/2024. FINDINGS: Cardiovascular: Left IJ Port-A-Cath terminates in the high right atrium. Atherosclerotic calcification of the aorta, aortic valve and coronary arteries. Enlarged pulmonic trunk and heart. Small pericardial effusion. Mediastinum/Nodes: Thoracic inlet lymph nodes are not enlarged by CT size criteria. No pathologically enlarged mediastinal, hilar or axillary lymph nodes. Soft tissue thickening in the right hilum,  as before, treatment related. Air in the esophagus can be seen with dysmotility. There may be mild thickening of the distal esophageal wall which can be seen with gastroesophageal reflux disease. Lungs/Pleura: Centrilobular and paraseptal emphysema. Postoperative scarring and volume loss in the medial right hemithorax with soft tissue thickening in the right suprahilar region (4/49), unchanged. Previously seen area of mixed consolidation and ground-glass in the lateral posterior segment right upper lobe (4/45) has decreased in size in the interval. New and progressive areas of mixed ground-glass and/or consolidation bilaterally. Patchy areas of generalized ground-glass, right greater than left. Trace right and small left pleural effusion. Left upper lobectomy. Post treatment narrowing of the right upper and right middle lobe bronchi. Debris in the airway. Upper Abdomen: Cholecystectomy. Small left adrenal myelolipoma. No specific follow-up necessary. Small low-attenuation lesion in the upper pole left kidney. No specific follow-up necessary. Visualized portions of the liver, adrenal glands, kidneys, spleen, pancreas, stomach and bowel are otherwise grossly unremarkable. No upper abdominal adenopathy. Musculoskeletal: Degenerative changes in the spine. Osteopenia. No worrisome lytic or sclerotic lesions. IMPRESSION: 1. Postoperative and post treatment scarring in the medial right hemithorax, stable. 2. Waxing and waning areas of ground-glass and/or consolidation, indicative of an ongoing atypical or fungal infectious process. 3. Small pericardial effusion. 4. Trace right and small left pleural effusions. 5. Small left adrenal myelolipoma. 6. Aortic atherosclerosis (ICD10-I70.0). Coronary artery calcification. 7. Enlarged pulmonic trunk, indicative of pulmonary arterial hypertension. 8.  Emphysema (ICD10-J43.9). Electronically Signed   By: Newell Eke M.D.   On: 09/23/2024 13:02   DG CHEST PORT 1 VIEW Result  Date: 09/23/2024 EXAM: 1 VIEW(S) XRAY OF THE CHEST 09/23/2024 04:52:29 AM COMPARISON: AP and lateral chest 09/21/2024. CLINICAL HISTORY: Hypoxia 200808 Hypoxia. FINDINGS: LINES, TUBES AND DEVICES: Left IJ port catheter terminates at the superior cavoatrial junction. LUNGS AND PLEURA: The lungs are emphysematous with bilateral postsurgical and scarring changes again noted. Patchy airspace infiltrate continues in the right mid to lower lung field with a small right pleural effusion. No other focal infiltrate seen. No new abnormality. No pneumothorax. HEART AND MEDIASTINUM: There is aortic atherosclerosis. Mild cardiomegaly. No acute abnormality of the mediastinal and cardiac silhouette. BONES AND SOFT TISSUES: No acute osseous abnormality. IMPRESSION: 1. Patchy airspace infiltrate in the right mid to lower lung field with a small right pleural effusion. Overall aeration seems unchanged. 2. Emphysematous lungs with bilateral postsurgical and scarring changes. 3. Mild cardiomegaly. Electronically signed by: Francis Quam MD 09/23/2024 05:05 AM EST RP Workstation: [id number]   DG Chest 2 View Result Date: 09/21/2024 EXAM: 2 VIEW(S) XRAY OF THE CHEST 09/21/2024 01:20:00 PM COMPARISON: 09/18/2024 CLINICAL HISTORY: Hypoxia FINDINGS: LINES, TUBES AND DEVICES: Stable left IJ port catheter to proximal right atrium. LUNGS AND PLEURA: Right upper lung staple line. Similar patchy airspace opacities throughout right lung. Stable coarse interstitial opacities at left lung base. Small right pleural effusion with blunting of right lateral costophrenic angle. No pneumothorax. HEART AND MEDIASTINUM: No acute abnormality of the cardiac and mediastinal silhouettes. BONES AND SOFT TISSUES: No acute osseous abnormality. IMPRESSION: 1. Similar pneumonia on the right lung. Small right pleural effusion. Electronically signed by: Norman Gatlin MD 09/21/2024 10:53 PM EST RP Workstation: [id number]   DG Chest Port 1 View if patient is in  a treatment room. Result Date: 09/18/2024 EXAM: 1 VIEW(S) XRAY OF THE CHEST 09/18/2024 09:27:00 AM COMPARISON: 08/31/2024 CLINICAL HISTORY: Suspected Sepsis FINDINGS: LINES, TUBES AND DEVICES: Left chest port stable in position. LUNGS AND PLEURA: Surgical clips in  left upper lung. New small right pleural effusion. Increased patchy airspace opacities in right lung. Increased interstitial opacities in left lung base. Postsurgical changes in right upper lung. No pneumothorax. HEART AND MEDIASTINUM: No acute abnormality of the cardiac and mediastinal silhouettes. BONES AND SOFT TISSUES: No acute osseous abnormality. IMPRESSION: 1. Increased patchy airspace opacities in the right lung and increased interstitial opacities in the left lung base, suspicious for infection in the setting of sepsis. 2. New small right pleural effusion. Electronically signed by: Waddell Calk MD 09/18/2024 09:31 AM EST RP Workstation: [id number]   DG Chest 2 View Result Date: 09/02/2024 CLINICAL DATA:  COPD exacerbation EXAM: CHEST - 2 VIEW COMPARISON:  Chest x-ray performed August 02, 2024 FINDINGS: A left-sided port is present with tip terminating in the right cavoatrial junction. Postsurgical changes are present in the left hemithorax and right hemithorax. Mild interstitial prominence. Patchy opacity is annotated in the right lung base. No pleural effusion or pneumothorax. Mild hyperexpansion. Degenerative changes in the thoracic spine. IMPRESSION: 1. Patchy airspace opacity is present in the right middle lobe, possibly developing pneumonia. 2. Underlying postsurgical changes and changes of chronic lung disease. Electronically Signed   By: Maude Naegeli M.D.   On: 09/02/2024 15:20    There are no new results to review at this time.  Previous records (including but not limited to H&P, progress notes, nursing notes, TOC management) were reviewed in assessment of this patient.  Labs: CBC: No results for input(s): WBC,  NEUTROABS, HGB, HCT, MCV, PLT in the last 168 hours. Basic Metabolic Panel: Recent Labs  Lab 09/22/24 0449  NA 144  K 4.1  CL 98  CO2 36*  GLUCOSE 151*  BUN 36*  CREATININE 0.87  CALCIUM  9.2   Liver Function Tests: No results for input(s): AST, ALT, ALKPHOS, BILITOT, PROT, ALBUMIN  in the last 168 hours. CBG: Recent Labs  Lab 09/25/24 1116 09/25/24 1612 09/25/24 1944 09/25/24 2125 09/26/24 0730  GLUCAP 232* 232* 282* 246* 135*    Scheduled Meds:  arformoterol   15 mcg Nebulization BID   azithromycin   250 mg Oral Daily   budesonide  (PULMICORT ) nebulizer solution  0.5 mg Nebulization BID   busPIRone   5 mg Oral TID   dextromethorphan -guaiFENesin   1 tablet Oral BID   diltiazem   120 mg Oral Daily   feeding supplement  237 mL Oral BID BM   furosemide   40 mg Oral Daily   insulin  aspart  0-5 Units Subcutaneous QHS   insulin  aspart  0-9 Units Subcutaneous TID WC   ipratropium-albuterol   3 mL Nebulization TID   loratadine   10 mg Oral Daily   metoprolol  succinate  100 mg Oral Daily   pantoprazole   40 mg Oral BID   predniSONE   40 mg Oral BID WC   rosuvastatin   40 mg Oral Daily   tamsulosin   0.8 mg Oral Daily   warfarin  6 mg Oral ONCE-1600   Warfarin - Pharmacist Dosing Inpatient   Does not apply q1600   Continuous Infusions:  cefTRIAXone  (ROCEPHIN )  IV 1 g (09/26/24 0931)   PRN Meds:.acetaminophen  **OR** acetaminophen , albuterol , guaiFENesin -dextromethorphan , hydrOXYzine , ondansetron  **OR** ondansetron  (ZOFRAN ) IV, mouth rinse  Family Communication: Wife at bedside  Disposition: Status is: Inpatient Remains inpatient appropriate because: Respiratory failure, lung cancer     Time spent: 40 minutes  Length of inpatient stay: 8 days  Author: Carliss LELON Canales, DO 09/26/2024 10:54 AM  For on call review [email or web address].   "

## 2024-09-26 NOTE — Progress Notes (Signed)
 PHARMACY - ANTICOAGULATION CONSULT NOTE  Pharmacy Consult for warfarin Indication: atrial fibrillation  Allergies[1]  Patient Measurements: Height: 5' 10 (177.8 cm) Weight: 87 kg (191 lb 12.8 oz) IBW/kg (Calculated) : 73 HEPARIN  DW (KG): 91.9  Vital Signs: BP: 132/85 (12/28 0900) Pulse Rate: 86 (12/28 0900)  Labs: Recent Labs    09/24/24 0431 09/25/24 0612 09/26/24 0500  LABPROT 23.0* 20.5* 22.9*  INR 1.9* 1.7* 1.9*    Estimated Creatinine Clearance: 76.9 mL/min (by C-G formula based on SCr of 0.87 mg/dL).   Medical History: Past Medical History:  Diagnosis Date   A-fib (HCC) 2017   after last lung surgery patient had a history if Afib documented in hospital    AAA (abdominal aortic aneurysm)    4.0 cm 09/17/22 US    Acute appendicitis 07/14/2021   Anxiety and depression 09/18/2024   Arthritis    back & legs ( knees)   Cancer (HCC)    lung   Complication of anesthesia    agitated upon waking from anesth.    Dyspnea    Dysrhythmia    A. Fib   Emphysema    Emphysema of lung (HCC)    GERD (gastroesophageal reflux disease) 09/18/2024   History of kidney stones    Hyperlipidemia    Hypertension    Oxygen  dependent    2L continuous   Peripheral vascular disease    Aortic Aneurysm   Pneumonia    Pre-diabetes    Sleep apnea    No OSA, but wears 2L [place] O2 continuous including at night   Stroke [facility]) 2022   Right sided weakness, decreased hearing, balance and memory loss   Tobacco abuse     Assessment: Pharmacy consulted to dose warfarin in patient with atrial fibrillation.  Patient's home dose listed as 5 mg on Mon + Fri and 2.5 mg ROW.  INR on admission is therapeutic at 2.6.  INR up to 1.9 today, still below goal but trending up.  Hgb 9.6 on 12/21, recheck in am.  Goal of Therapy:  INR 2-3 Monitor platelets by anticoagulation protocol: Yes   Plan:  Warfarin 6mg  tonight Monitor daily INR and s/s of bleeding  Dempsey Blush PharmD., BCPS Clinical  Pharmacist 09/26/2024 10:11 AM        [1] No Active Allergies

## 2024-09-26 NOTE — Plan of Care (Signed)
" °  Problem: Education: Goal: Knowledge of General Education information will improve Description: Including pain rating scale, medication(s)/side effects and non-pharmacologic comfort measures Outcome: Progressing   Problem: Health Behavior/Discharge Planning: Goal: Ability to manage health-related needs will improve Outcome: Progressing   Problem: Clinical Measurements: Goal: Ability to maintain clinical measurements within normal limits will improve Outcome: Progressing Goal: Will remain free from infection Outcome: Progressing Goal: Diagnostic test results will improve Outcome: Progressing Goal: Respiratory complications will improve Outcome: Progressing   Problem: Activity: Goal: Risk for activity intolerance will decrease Outcome: Progressing   Problem: Nutrition: Goal: Adequate nutrition will be maintained Outcome: Progressing   Problem: Coping: Goal: Level of anxiety will decrease Outcome: Progressing   Problem: Elimination: Goal: Will not experience complications related to bowel motility Outcome: Progressing Goal: Will not experience complications related to urinary retention Outcome: Progressing   Problem: Pain Managment: Goal: General experience of comfort will improve and/or be controlled Outcome: Progressing   Problem: Safety: Goal: Ability to remain free from injury will improve Outcome: Progressing   Problem: Education: Goal: Ability to describe self-care measures that may prevent or decrease complications (Diabetes Survival Skills Education) will improve Outcome: Progressing Goal: Individualized Educational Video(s) Outcome: Progressing   Problem: Coping: Goal: Ability to adjust to condition or change in health will improve Outcome: Progressing   Problem: Health Behavior/Discharge Planning: Goal: Ability to identify and utilize available resources and services will improve Outcome: Progressing Goal: Ability to manage health-related needs will  improve Outcome: Progressing   Problem: Metabolic: Goal: Ability to maintain appropriate glucose levels will improve Outcome: Progressing   Problem: Nutritional: Goal: Maintenance of adequate nutrition will improve Outcome: Progressing Goal: Progress toward achieving an optimal weight will improve Outcome: Progressing   Problem: Skin Integrity: Goal: Risk for impaired skin integrity will decrease Outcome: Progressing   Problem: Tissue Perfusion: Goal: Adequacy of tissue perfusion will improve Outcome: Progressing   "

## 2024-09-26 NOTE — Hospital Course (Signed)
 73 y.o. male with medical history significant of non-small cell lung cancer, COPD, chronic respiratory failure on 2-3 L supplementation, paroxysmal atrial fibrillation (on Coumadin ), AAA, hypertension, hyperlipidemia, prediabetes, BPH, and prior history of CVA; who presented to the hospital secondary to shortness of breath and worsening mildly productive coughing spells.  Admitted for management of acute chronic hypoxic respiratory failure comfortably as well as COPD exacerbation.   Assessment and Plan:   Acute on chronic hypoxic respiratory failure - Currently weaned down to 3 L nasal cannula which is around patient's baseline of 2-3 L.  Multifactorial etiology given underlying COPD exacerbation, immunotherapy induced pneumonitis, possible CAP.   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  Showing improvement.  Palliative consult placed.  Patient is DNR/DNI.   Possible commune acquired pneumonia - Negative for COVID/RSV/flu.  Given extensive ground glass opacities and severe emphysema, receiving empiric broad-spectrum antibiotics.  No indication for bronchoscopy.   Acute COPD exacerbation - As noted above, CT confirming severe emphysema.  Mucolytics, steroids, nebulizers.   Paroxysmal atrial fibrillation - S/p cardioversion.  Continue Cardizem  and metoprolol .  Warfarin on board.   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  Follows with oncology in the outpatient setting.  Palliative care consulted.   BPH - Flomax  on board.   Chronic HFpEF - Currently on p.o. Lasix  40 mg daily.   Goals of care - Given patient's lung cancer, worsening chronic hypoxia, immunotherapy pneumonitis, discussions about ongoing palliative/hospice care.  Working closely with TOC, family, patient on disposition planning.

## 2024-09-27 ENCOUNTER — Encounter: Payer: Self-pay | Admitting: *Deleted

## 2024-09-27 DIAGNOSIS — I480 Paroxysmal atrial fibrillation: Secondary | ICD-10-CM | POA: Diagnosis not present

## 2024-09-27 DIAGNOSIS — N400 Enlarged prostate without lower urinary tract symptoms: Secondary | ICD-10-CM | POA: Diagnosis not present

## 2024-09-27 DIAGNOSIS — F32A Depression, unspecified: Secondary | ICD-10-CM | POA: Diagnosis not present

## 2024-09-27 DIAGNOSIS — J9621 Acute and chronic respiratory failure with hypoxia: Secondary | ICD-10-CM | POA: Diagnosis not present

## 2024-09-27 DIAGNOSIS — J441 Chronic obstructive pulmonary disease with (acute) exacerbation: Secondary | ICD-10-CM | POA: Diagnosis not present

## 2024-09-27 DIAGNOSIS — F419 Anxiety disorder, unspecified: Secondary | ICD-10-CM | POA: Diagnosis not present

## 2024-09-27 DIAGNOSIS — C3490 Malignant neoplasm of unspecified part of unspecified bronchus or lung: Secondary | ICD-10-CM | POA: Diagnosis not present

## 2024-09-27 LAB — CBC
HCT: 36.6 % — ABNORMAL LOW (ref 39.0–52.0)
Hemoglobin: 11 g/dL — ABNORMAL LOW (ref 13.0–17.0)
MCH: 23.6 pg — ABNORMAL LOW (ref 26.0–34.0)
MCHC: 30.1 g/dL (ref 30.0–36.0)
MCV: 78.5 fL — ABNORMAL LOW (ref 80.0–100.0)
Platelets: 163 K/uL (ref 150–400)
RBC: 4.66 MIL/uL (ref 4.22–5.81)
RDW: 17.2 % — ABNORMAL HIGH (ref 11.5–15.5)
WBC: 14.5 K/uL — ABNORMAL HIGH (ref 4.0–10.5)
nRBC: 0.1 % (ref 0.0–0.2)

## 2024-09-27 LAB — GLUCOSE, CAPILLARY
Glucose-Capillary: 143 mg/dL — ABNORMAL HIGH (ref 70–99)
Glucose-Capillary: 214 mg/dL — ABNORMAL HIGH (ref 70–99)
Glucose-Capillary: 259 mg/dL — ABNORMAL HIGH (ref 70–99)
Glucose-Capillary: 395 mg/dL — ABNORMAL HIGH (ref 70–99)

## 2024-09-27 LAB — PROTIME-INR
INR: 2.2 — ABNORMAL HIGH (ref 0.8–1.2)
Prothrombin Time: 25.5 s — ABNORMAL HIGH (ref 11.4–15.2)

## 2024-09-27 MED ORDER — WARFARIN SODIUM 5 MG PO TABS
5.0000 mg | ORAL_TABLET | Freq: Once | ORAL | Status: AC
  Administered 2024-09-27: 5 mg via ORAL
  Filled 2024-09-27: qty 1

## 2024-09-27 NOTE — Inpatient Diabetes Management (Signed)
 Inpatient Diabetes Program Recommendations  AACE/[name]: New Consensus Statement on Inpatient Glycemic Control   Target Ranges:  Prepandial:   less than 140 mg/dL      Peak postprandial:   less than 180 mg/dL (1-2 hours)      Critically ill patients:  140 - 180 mg/dL    Latest Reference Range & Units 09/26/24 07:30 09/26/24 11:46 09/26/24 16:48 09/26/24 19:43 09/27/24 07:37 09/27/24 11:04  Glucose-Capillary 70 - 99 mg/dL 864 (H) 829 (H) 743 (H) 371 (H) 143 (H) 214 (H)   Review of Glycemic Control  Diabetes history: PreDM Outpatient Diabetes medications: None Current orders for Inpatient glycemic control: Novolog  0-9 units TID with meals, Novolog  0-5 units at bedtime; Prednisone  40 mg BID  Inpatient Diabetes Program Recommendations:    Insulin : If steroids are continued, please consider ordering Novolog  2 units TID with meals for meal coverage if patient eats at least 50% of meals.  Thanks, Earnie Gainer, RN, MSN, CDCES Diabetes Coordinator Inpatient Diabetes Program [phone] (Team Pager from 8am to 5pm)

## 2024-09-27 NOTE — Plan of Care (Signed)
" °  Problem: Education: Goal: Knowledge of General Education information will improve Description: Including pain rating scale, medication(s)/side effects and non-pharmacologic comfort measures Outcome: Progressing   Problem: Health Behavior/Discharge Planning: Goal: Ability to manage health-related needs will improve Outcome: Progressing   Problem: Clinical Measurements: Goal: Ability to maintain clinical measurements within normal limits will improve Outcome: Progressing Goal: Will remain free from infection Outcome: Progressing Goal: Diagnostic test results will improve Outcome: Progressing Goal: Respiratory complications will improve Outcome: Progressing   Problem: Activity: Goal: Risk for activity intolerance will decrease Outcome: Progressing   Problem: Nutrition: Goal: Adequate nutrition will be maintained Outcome: Progressing   Problem: Coping: Goal: Level of anxiety will decrease Outcome: Progressing   Problem: Elimination: Goal: Will not experience complications related to bowel motility Outcome: Progressing Goal: Will not experience complications related to urinary retention Outcome: Progressing   Problem: Pain Managment: Goal: General experience of comfort will improve and/or be controlled Outcome: Progressing   Problem: Safety: Goal: Ability to remain free from injury will improve Outcome: Progressing   Problem: Education: Goal: Ability to describe self-care measures that may prevent or decrease complications (Diabetes Survival Skills Education) will improve Outcome: Progressing Goal: Individualized Educational Video(s) Outcome: Progressing   Problem: Coping: Goal: Ability to adjust to condition or change in health will improve Outcome: Progressing   Problem: Health Behavior/Discharge Planning: Goal: Ability to identify and utilize available resources and services will improve Outcome: Progressing Goal: Ability to manage health-related needs will  improve Outcome: Progressing   Problem: Metabolic: Goal: Ability to maintain appropriate glucose levels will improve Outcome: Progressing   Problem: Nutritional: Goal: Maintenance of adequate nutrition will improve Outcome: Progressing Goal: Progress toward achieving an optimal weight will improve Outcome: Progressing   Problem: Skin Integrity: Goal: Risk for impaired skin integrity will decrease Outcome: Progressing   Problem: Tissue Perfusion: Goal: Adequacy of tissue perfusion will improve Outcome: Progressing   "

## 2024-09-27 NOTE — Progress Notes (Signed)
 " Progress Note   Patient: Randall Sullivan FMW:[id number] DOB: [birth year] DOA: 09/18/2024  DOS: the patient was seen and examined on 09/27/2024   Brief hospital course:  73 y.o. male with medical history significant of non-small cell lung cancer, COPD, chronic respiratory failure on 2-3 L supplementation, paroxysmal atrial fibrillation (on Coumadin ), AAA, hypertension, hyperlipidemia, prediabetes, BPH, and prior history of CVA; who presented to the hospital secondary to shortness of breath and worsening mildly productive coughing spells.  Admitted for management of acute chronic hypoxic respiratory failure comfortably as well as COPD exacerbation.   Assessment and Plan:   Acute on chronic hypoxic respiratory failure - Currently weaned down to 3 L nasal cannula which is around patient's baseline of 2-3 L.  Multifactorial etiology given underlying COPD exacerbation, immunotherapy induced pneumonitis, possible CAP.   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  Showing improvement.  Palliative consult placed.  Patient is DNR/DNI.   Possible commune acquired pneumonia - Negative for COVID/RSV/flu.  Given extensive ground glass opacities and severe emphysema, receiving empiric broad-spectrum antibiotics.  No indication for bronchoscopy.   Acute COPD exacerbation - As noted above, CT confirming severe emphysema.  Mucolytics, steroids, nebulizers.   Paroxysmal atrial fibrillation - S/p cardioversion.  Continue Cardizem  and metoprolol .  Warfarin on board.   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  Follows with oncology in the outpatient setting.  Palliative care consulted.   BPH - Flomax  on board.   Chronic HFpEF - Currently on p.o. Lasix  40 mg daily.   Goals of care - Given patient's lung cancer, worsening chronic hypoxia,  immunotherapy pneumonitis, discussions about ongoing palliative/hospice care.  Working closely with TOC, family, patient on disposition planning.  Patient to be discharged home with hospice.  However DME equipment needs to be delivered first including hospital bed.  Anticipate discharge tomorrow.   Subjective: Patient resting comfortably this morning, wife at bedside.  Was able to meet with TOC, hospice yesterday.  Patient denies any worsening shortness of breath, fever, pain, nausea, vomiting.  Questions answered and concerns addressed.  Physical Exam:  Vitals:   09/26/24 2036 09/27/24 0400 09/27/24 0629 09/27/24 0631  BP:  136/78    Pulse: 92 76    Resp:  20    Temp:  98 F (36.7 C)    TempSrc:  Oral    SpO2: 100% 100% 96% 96%  Weight:  87 kg    Height:        GENERAL:  Alert, pleasant, no acute distress, disheveled HEENT:  EOMI, nasal cannula CARDIOVASCULAR:  RRR, no murmurs appreciated RESPIRATORY: Poor air movement bilaterally GASTROINTESTINAL:  Soft, nontender, nondistended EXTREMITIES:  No LE edema bilaterally NEURO:  No new focal deficits appreciated SKIN:  No rashes noted PSYCH:  Appropriate mood and affect    Data Reviewed:  Imaging Studies: CT CHEST W CONTRAST Result Date: 09/23/2024 CLINICAL DATA:  Pneumonia, lung cancer.  * Tracking Code: BO * EXAM: CT CHEST WITH CONTRAST TECHNIQUE: Multidetector CT imaging of the chest was performed during intravenous contrast administration. RADIATION DOSE REDUCTION: This exam was performed according to the departmental dose-optimization program which includes automated exposure control, adjustment of the mA and/or kV according to patient size and/or use of iterative reconstruction technique. CONTRAST:  75mL OMNIPAQUE  IOHEXOL  300 MG/ML  SOLN COMPARISON:  08/02/2024. FINDINGS: Cardiovascular: Left IJ Port-A-Cath terminates in the high right atrium. Atherosclerotic  calcification of the aorta, aortic valve and coronary arteries.  Enlarged pulmonic trunk and heart. Small pericardial effusion. Mediastinum/Nodes: Thoracic inlet lymph nodes are not enlarged by CT size criteria. No pathologically enlarged mediastinal, hilar or axillary lymph nodes. Soft tissue thickening in the right hilum, as before, treatment related. Air in the esophagus can be seen with dysmotility. There may be mild thickening of the distal esophageal wall which can be seen with gastroesophageal reflux disease. Lungs/Pleura: Centrilobular and paraseptal emphysema. Postoperative scarring and volume loss in the medial right hemithorax with soft tissue thickening in the right suprahilar region (4/49), unchanged. Previously seen area of mixed consolidation and ground-glass in the lateral posterior segment right upper lobe (4/45) has decreased in size in the interval. New and progressive areas of mixed ground-glass and/or consolidation bilaterally. Patchy areas of generalized ground-glass, right greater than left. Trace right and small left pleural effusion. Left upper lobectomy. Post treatment narrowing of the right upper and right middle lobe bronchi. Debris in the airway. Upper Abdomen: Cholecystectomy. Small left adrenal myelolipoma. No specific follow-up necessary. Small low-attenuation lesion in the upper pole left kidney. No specific follow-up necessary. Visualized portions of the liver, adrenal glands, kidneys, spleen, pancreas, stomach and bowel are otherwise grossly unremarkable. No upper abdominal adenopathy. Musculoskeletal: Degenerative changes in the spine. Osteopenia. No worrisome lytic or sclerotic lesions. IMPRESSION: 1. Postoperative and post treatment scarring in the medial right hemithorax, stable. 2. Waxing and waning areas of ground-glass and/or consolidation, indicative of an ongoing atypical or fungal infectious process. 3. Small pericardial effusion. 4. Trace right and small left pleural effusions. 5. Small left adrenal myelolipoma. 6. Aortic  atherosclerosis (ICD10-I70.0). Coronary artery calcification. 7. Enlarged pulmonic trunk, indicative of pulmonary arterial hypertension. 8.  Emphysema (ICD10-J43.9). Electronically Signed   By: Newell Eke M.D.   On: 09/23/2024 13:02   DG CHEST PORT 1 VIEW Result Date: 09/23/2024 EXAM: 1 VIEW(S) XRAY OF THE CHEST 09/23/2024 04:52:29 AM COMPARISON: AP and lateral chest 09/21/2024. CLINICAL HISTORY: Hypoxia 200808 Hypoxia. FINDINGS: LINES, TUBES AND DEVICES: Left IJ port catheter terminates at the superior cavoatrial junction. LUNGS AND PLEURA: The lungs are emphysematous with bilateral postsurgical and scarring changes again noted. Patchy airspace infiltrate continues in the right mid to lower lung field with a small right pleural effusion. No other focal infiltrate seen. No new abnormality. No pneumothorax. HEART AND MEDIASTINUM: There is aortic atherosclerosis. Mild cardiomegaly. No acute abnormality of the mediastinal and cardiac silhouette. BONES AND SOFT TISSUES: No acute osseous abnormality. IMPRESSION: 1. Patchy airspace infiltrate in the right mid to lower lung field with a small right pleural effusion. Overall aeration seems unchanged. 2. Emphysematous lungs with bilateral postsurgical and scarring changes. 3. Mild cardiomegaly. Electronically signed by: Francis Quam MD 09/23/2024 05:05 AM EST RP Workstation: [id number]   DG Chest 2 View Result Date: 09/21/2024 EXAM: 2 VIEW(S) XRAY OF THE CHEST 09/21/2024 01:20:00 PM COMPARISON: 09/18/2024 CLINICAL HISTORY: Hypoxia FINDINGS: LINES, TUBES AND DEVICES: Stable left IJ port catheter to proximal right atrium. LUNGS AND PLEURA: Right upper lung staple line. Similar patchy airspace opacities throughout right lung. Stable coarse interstitial opacities at left lung base. Small right pleural effusion with blunting of right lateral costophrenic angle. No pneumothorax. HEART AND MEDIASTINUM: No acute abnormality of the cardiac and mediastinal silhouettes.  BONES AND SOFT TISSUES: No acute osseous abnormality. IMPRESSION: 1. Similar pneumonia on the right lung. Small right pleural effusion. Electronically signed by: Norman Gatlin MD 09/21/2024 10:53 PM EST RP Workstation: [id number]   DG Chest  Port 1 View if patient is in a treatment room. Result Date: 09/18/2024 EXAM: 1 VIEW(S) XRAY OF THE CHEST 09/18/2024 09:27:00 AM COMPARISON: 08/31/2024 CLINICAL HISTORY: Suspected Sepsis FINDINGS: LINES, TUBES AND DEVICES: Left chest port stable in position. LUNGS AND PLEURA: Surgical clips in left upper lung. New small right pleural effusion. Increased patchy airspace opacities in right lung. Increased interstitial opacities in left lung base. Postsurgical changes in right upper lung. No pneumothorax. HEART AND MEDIASTINUM: No acute abnormality of the cardiac and mediastinal silhouettes. BONES AND SOFT TISSUES: No acute osseous abnormality. IMPRESSION: 1. Increased patchy airspace opacities in the right lung and increased interstitial opacities in the left lung base, suspicious for infection in the setting of sepsis. 2. New small right pleural effusion. Electronically signed by: Waddell Calk MD 09/18/2024 09:31 AM EST RP Workstation: [id number]   DG Chest 2 View Result Date: 09/02/2024 CLINICAL DATA:  COPD exacerbation EXAM: CHEST - 2 VIEW COMPARISON:  Chest x-ray performed August 02, 2024 FINDINGS: A left-sided port is present with tip terminating in the right cavoatrial junction. Postsurgical changes are present in the left hemithorax and right hemithorax. Mild interstitial prominence. Patchy opacity is annotated in the right lung base. No pleural effusion or pneumothorax. Mild hyperexpansion. Degenerative changes in the thoracic spine. IMPRESSION: 1. Patchy airspace opacity is present in the right middle lobe, possibly developing pneumonia. 2. Underlying postsurgical changes and changes of chronic lung disease. Electronically Signed   By: Maude Naegeli M.D.   On:  09/02/2024 15:20    There are no new results to review at this time.  Previous records (including but not limited to H&P, progress notes, nursing notes, TOC management) were reviewed in assessment of this patient.  Labs: CBC: Recent Labs  Lab 09/27/24 0516  WBC 14.5*  HGB 11.0*  HCT 36.6*  MCV 78.5*  PLT 163   Basic Metabolic Panel: Recent Labs  Lab 09/22/24 0449  NA 144  K 4.1  CL 98  CO2 36*  GLUCOSE 151*  BUN 36*  CREATININE 0.87  CALCIUM  9.2   Liver Function Tests: No results for input(s): AST, ALT, ALKPHOS, BILITOT, PROT, ALBUMIN  in the last 168 hours. CBG: Recent Labs  Lab 09/26/24 0730 09/26/24 1146 09/26/24 1648 09/26/24 1943 09/27/24 0737  GLUCAP 135* 170* 256* 371* 143*    Scheduled Meds:  arformoterol   15 mcg Nebulization BID   azithromycin   250 mg Oral Daily   budesonide  (PULMICORT ) nebulizer solution  0.5 mg Nebulization BID   busPIRone   5 mg Oral TID   dextromethorphan -guaiFENesin   1 tablet Oral BID   diltiazem   120 mg Oral Daily   feeding supplement  237 mL Oral BID BM   furosemide   40 mg Oral Daily   insulin  aspart  0-5 Units Subcutaneous QHS   insulin  aspart  0-9 Units Subcutaneous TID WC   ipratropium-albuterol   3 mL Nebulization TID   loratadine   10 mg Oral Daily   metoprolol  succinate  100 mg Oral Daily   pantoprazole   40 mg Oral BID   predniSONE   40 mg Oral BID WC   rosuvastatin   40 mg Oral Daily   tamsulosin   0.8 mg Oral Daily   warfarin  5 mg Oral ONCE-1600   Warfarin - Pharmacist Dosing Inpatient   Does not apply q1600   Continuous Infusions:  cefTRIAXone  (ROCEPHIN )  IV 1 g (09/27/24 0840)   PRN Meds:.acetaminophen  **OR** acetaminophen , albuterol , guaiFENesin -dextromethorphan , hydrOXYzine , ondansetron  **OR** ondansetron  (ZOFRAN ) IV, mouth rinse  Family Communication: Wife at  bedside  Disposition: Status is: Inpatient Remains inpatient appropriate because: Respiratory failure, home with hospice     Time  spent: 38 minutes  Length of inpatient stay: 9 days  Author: Carliss LELON Canales, DO 09/27/2024 10:56 AM  For on call review [email or web address].   "

## 2024-09-27 NOTE — Evaluation (Signed)
 Occupational Therapy Evaluation Patient Details Name: Randall Sullivan MRN: [id number] DOB: 1950-12-24 Today's Date: 09/27/2024   History of Present Illness   Randall Sullivan is a 73 y.o. male with medical history significant of non-small cell lung cancer, COPD, chronic respiratory failure on 2-3 L supplementation, paroxysmal atrial fibrillation (on Coumadin ), AAA, hypertension, hyperlipidemia, prediabetes, BPH, and prior history of CVA; who presented to the hospital secondary to shortness of breath and worsening mildly productive coughing spells.  Patient reports that for the last 48 hours at home he has noticed increased shortness of breath and hypoxia despite high home oxygen  supplementation; nebulizer treatments have failed to improve his symptoms.     Patient expressed no fever, no chills, no sick contacts, no hemoptysis, no nausea, no vomiting, no chest pain, no hematuria/dysuria or any focal weaknesses.     Workup in the ED demonstrating the need of 6 L supplementation to maintain saturation above 90-92%; chest x-ray with multifocal pneumonia.     Clinical Impressions Pt agreeable to OT evaluation. Pt reports feelin very tired despite sleeping well previous night. Pt able to complete bed mobility with HOB elevated and use of bed rails. He completes sit to stand t/f with RW and supervision for safety. Completed functional mobility across room with supervision and use of RW- no reports of SOB or dizziness. Pt demonstrated generalized weakness in UE and ROM WFL. Pt would continue to benefit from OT services while in acute setting.      If plan is discharge home, recommend the following:   A little help with walking and/or transfers;A little help with bathing/dressing/bathroom;Assistance with cooking/housework;Help with stairs or ramp for entrance     Functional Status Assessment   Patient has had a recent decline in their functional status and demonstrates the ability to make significant  improvements in function in a reasonable and predictable amount of time.     Equipment Recommendations   None recommended by OT      Precautions/Restrictions   Precautions Precautions: Fall Recall of Precautions/Restrictions: Intact Restrictions Weight Bearing Restrictions Per Provider Order: No     Mobility Bed Mobility Overal bed mobility: Modified Independent             General bed mobility comments: HOB partially raised    Transfers Overall transfer level: Needs assistance Equipment used: Rolling walker (2 wheels) Transfers: Sit to/from Stand Sit to Stand: Supervision           General transfer comment: slow labored movement      Balance Overall balance assessment: Needs assistance Sitting-balance support: Feet supported, No upper extremity supported Sitting balance-Leahy Scale: Good Sitting balance - Comments: seated at EOB   Standing balance support: Bilateral upper extremity supported, During functional activity, Reliant on assistive device for balance Standing balance-Leahy Scale: Fair Standing balance comment: fair/good using RW                           ADL either performed or assessed with clinical judgement   ADL Overall ADL's : Needs assistance/impaired Eating/Feeding: Sitting;Set up   Grooming: Sitting;Set up   Upper Body Bathing: Minimal assistance;Sitting   Lower Body Bathing: Minimal assistance;Sitting/lateral leans   Upper Body Dressing : Minimal assistance;Sitting   Lower Body Dressing: Minimal assistance;Sitting/lateral leans   Toilet Transfer: Contact guard assist;Rolling walker (2 wheels)   Toileting- Clothing Manipulation and Hygiene: Minimal assistance;Sitting/lateral lean   Tub/ Shower Transfer: Contact guard assist;Rolling walker (2 wheels)  Functional mobility during ADLs: Contact guard assist;Rolling walker (2 wheels)       Vision Baseline Vision/History: 0 No visual deficits Ability to See in  Adequate Light: 0 Adequate Patient Visual Report: No change from baseline Vision Assessment?: No apparent visual deficits     Perception Perception: Not tested       Praxis Praxis: Not tested       Pertinent Vitals/Pain Pain Assessment Pain Assessment: No/denies pain     Extremity/Trunk Assessment Upper Extremity Assessment Upper Extremity Assessment: Generalized weakness   Lower Extremity Assessment Lower Extremity Assessment: Defer to PT evaluation   Cervical / Trunk Assessment Cervical / Trunk Assessment: Normal   Communication Communication Communication: No apparent difficulties   Cognition Arousal: Alert Behavior During Therapy: WFL for tasks assessed/performed Cognition: No apparent impairments                               Following commands: Intact       Cueing  General Comments   Cueing Techniques: Verbal cues;Tactile cues              Home Living Family/patient expects to be discharged to:: Private residence Living Arrangements: Spouse/significant other Available Help at Discharge: Family;Available PRN/intermittently Type of Home: House Home Access: Stairs to enter [employer] of Steps: 4 Entrance Stairs-Rails: Left;Right;Can reach both Home Layout: One level     Bathroom Shower/Tub: Chief Strategy Officer: Standard Bathroom Accessibility: Yes How Accessible: Accessible via walker Home Equipment: Cane - single point;Shower seat;Rolling Walker (2 wheels);Grab bars - tub/shower          Prior Functioning/Environment Prior Level of Function : Needs assist       Physical Assist : ADLs (physical);Mobility (physical)   ADLs (physical): IADLs Mobility Comments: Ambulates without AD inside and uses SPC when outside. Uses 2 LPM supplemental O2 at baseline. ADLs Comments: Independent ADL; assist IADL's.    OT Problem List: Decreased activity tolerance;Decreased strength;Impaired balance (sitting  and/or standing)   OT Treatment/Interventions: Self-care/ADL training;Therapeutic exercise;Energy conservation;DME and/or AE instruction;Therapeutic activities;Patient/family education      OT Goals(Current goals can be found in the care plan section)   Acute Rehab OT Goals Patient Stated Goal: to return home OT Goal Formulation: With patient Time For Goal Achievement: 10/11/24 Potential to Achieve Goals: Good ADL Goals Pt Will Perform Grooming: Independently;standing Pt Will Perform Upper Body Bathing: Independently;sitting Pt Will Perform Lower Body Dressing: Independently;sit to/from stand Pt Will Transfer to Toilet: Independently Pt/caregiver will Perform Home Exercise Program: Increased strength;Both right and left upper extremity;With written HEP provided   OT Frequency:  Min 2X/week       AM-PAC OT 6 Clicks Daily Activity     Outcome Measure Help from another person eating meals?: None Help from another person taking care of personal grooming?: None Help from another person toileting, which includes using toliet, bedpan, or urinal?: A Little Help from another person bathing (including washing, rinsing, drying)?: A Little Help from another person to put on and taking off regular upper body clothing?: A Little Help from another person to put on and taking off regular lower body clothing?: A Little 6 Click Score: 20   End of Session Equipment Utilized During Treatment: Oxygen ;Rolling walker (2 wheels)  Activity Tolerance: Patient tolerated treatment well Patient left: in bed  OT Visit Diagnosis: Muscle weakness (generalized) (M62.81)  Time: 1001-1015 OT Time Calculation (min): 14 min Charges:  OT General Charges $OT Visit: 1 Visit OT Evaluation $OT Eval Low Complexity: 1 Low  Chiquita LOISE Sermon, OTR/L 09/27/2024, 11:13 AM

## 2024-09-27 NOTE — Plan of Care (Signed)
" ° °  Problem: Acute Rehab OT Goals (only OT should resolve) Goal: Pt. Will Perform Grooming Flowsheets (Taken 09/27/2024 1112) Pt Will Perform Grooming:  Independently  standing Goal: Pt. Will Perform Upper Body Bathing Flowsheets (Taken 09/27/2024 1112) Pt Will Perform Upper Body Bathing:  Independently  sitting Goal: Pt. Will Perform Lower Body Dressing Flowsheets (Taken 09/27/2024 1112) Pt Will Perform Lower Body Dressing:  Independently  sit to/from stand Goal: Pt. Will Transfer To Toilet Flowsheets (Taken 09/27/2024 1112) Pt Will Transfer to Toilet: Independently Goal: Pt/Caregiver Will Perform Home Exercise Program Flowsheets (Taken 09/27/2024 1112) Pt/caregiver will Perform Home Exercise Program:  Increased strength  Both right and left upper extremity  With written HEP provided   Chiquita Sermon, OTR/L  "

## 2024-09-27 NOTE — Progress Notes (Signed)
 [facility] Liaison Note   [facility] continues to follow for discharge disposition for hospice services at home.     Please call with any hospice related questions or concerns.   Henderson Newcomer, LPN [facility] Liaison [phone]

## 2024-09-27 NOTE — Progress Notes (Signed)
 PHARMACY - ANTICOAGULATION CONSULT NOTE  Pharmacy Consult for warfarin Indication: atrial fibrillation  Allergies[1]  Patient Measurements: Height: 5' 10 (177.8 cm) Weight: 87 kg (191 lb 12.8 oz) IBW/kg (Calculated) : 73 HEPARIN  DW (KG): 91.9  Vital Signs: Temp: 98 F (36.7 C) (12/29 0400) Temp Source: Oral (12/29 0400) BP: 136/78 (12/29 0400) Pulse Rate: 76 (12/29 0400)  Labs: Recent Labs    09/25/24 0612 09/26/24 0500 09/27/24 0516  HGB  --   --  11.0*  HCT  --   --  36.6*  PLT  --   --  163  LABPROT 20.5* 22.9* 25.5*  INR 1.7* 1.9* 2.2*    Estimated Creatinine Clearance: 76.9 mL/min (by C-G formula based on SCr of 0.87 mg/dL).   Medical History: Past Medical History:  Diagnosis Date   A-fib (HCC) 2017   after last lung surgery patient had a history if Afib documented in hospital    AAA (abdominal aortic aneurysm)    4.0 cm 09/17/22 US    Acute appendicitis 07/14/2021   Anxiety and depression 09/18/2024   Arthritis    back & legs ( knees)   Cancer (HCC)    lung   Complication of anesthesia    agitated upon waking from anesth.    Dyspnea    Dysrhythmia    A. Fib   Emphysema    Emphysema of lung (HCC)    GERD (gastroesophageal reflux disease) 09/18/2024   History of kidney stones    Hyperlipidemia    Hypertension    Oxygen  dependent    2L continuous   Peripheral vascular disease    Aortic Aneurysm   Pneumonia    Pre-diabetes    Sleep apnea    No OSA, but wears 2L [place] O2 continuous including at night   Stroke [facility]) 2022   Right sided weakness, decreased hearing, balance and memory loss   Tobacco abuse     Assessment: Pharmacy consulted to dose warfarin in patient with atrial fibrillation.  Patient's home dose listed as 5 mg on Mon + Fri and 2.5 mg ROW.  INR on admission is therapeutic at 2.6.  INR up to 2.2 today, now at goal. Hgb up to 11 this morning. No bleeding issues noted.   Goal of Therapy:  INR 2-3 Monitor platelets by  anticoagulation protocol: Yes   Plan:  Warfarin 5mg  tonight in attempt to transition back to home dose Monitor daily INR and s/s of bleeding  Dempsey Blush PharmD., BCPS Clinical Pharmacist 09/27/2024 10:21 AM         [1] No Active Allergies

## 2024-09-28 DIAGNOSIS — N400 Enlarged prostate without lower urinary tract symptoms: Secondary | ICD-10-CM | POA: Diagnosis not present

## 2024-09-28 DIAGNOSIS — K219 Gastro-esophageal reflux disease without esophagitis: Secondary | ICD-10-CM | POA: Diagnosis not present

## 2024-09-28 DIAGNOSIS — J441 Chronic obstructive pulmonary disease with (acute) exacerbation: Secondary | ICD-10-CM | POA: Diagnosis not present

## 2024-09-28 DIAGNOSIS — C3490 Malignant neoplasm of unspecified part of unspecified bronchus or lung: Secondary | ICD-10-CM | POA: Diagnosis not present

## 2024-09-28 DIAGNOSIS — E1159 Type 2 diabetes mellitus with other circulatory complications: Secondary | ICD-10-CM | POA: Diagnosis not present

## 2024-09-28 DIAGNOSIS — I480 Paroxysmal atrial fibrillation: Secondary | ICD-10-CM | POA: Diagnosis not present

## 2024-09-28 DIAGNOSIS — J9621 Acute and chronic respiratory failure with hypoxia: Secondary | ICD-10-CM | POA: Diagnosis not present

## 2024-09-28 LAB — PROTIME-INR
INR: 3.3 — ABNORMAL HIGH (ref 0.8–1.2)
Prothrombin Time: 35.3 s — ABNORMAL HIGH (ref 11.4–15.2)

## 2024-09-28 LAB — GLUCOSE, CAPILLARY
Glucose-Capillary: 148 mg/dL — ABNORMAL HIGH (ref 70–99)
Glucose-Capillary: 203 mg/dL — ABNORMAL HIGH (ref 70–99)

## 2024-09-28 MED ORDER — FUROSEMIDE 20 MG PO TABS: 20.0000 mg | ORAL_TABLET | Freq: Every day | ORAL | 0 refills | Status: AC

## 2024-09-28 MED ORDER — ONDANSETRON HCL 4 MG PO TABS: 4.0000 mg | ORAL_TABLET | Freq: Four times a day (QID) | ORAL | 0 refills | Status: AC | PRN

## 2024-09-28 MED ORDER — PANTOPRAZOLE SODIUM 40 MG PO TBEC: 40.0000 mg | DELAYED_RELEASE_TABLET | Freq: Two times a day (BID) | ORAL | 0 refills | Status: AC

## 2024-09-28 MED ORDER — GABAPENTIN 300 MG PO CAPS
300.0000 mg | ORAL_CAPSULE | Freq: Two times a day (BID) | ORAL | Status: DC
  Administered 2024-09-28: 300 mg via ORAL
  Filled 2024-09-28: qty 1

## 2024-09-28 MED ORDER — BUSPIRONE HCL 5 MG PO TABS: 5.0000 mg | ORAL_TABLET | Freq: Three times a day (TID) | ORAL | 0 refills | Status: AC

## 2024-09-28 MED ORDER — PREDNISONE 10 MG PO TABS: ORAL_TABLET | ORAL | 0 refills | Status: AC

## 2024-09-28 MED ORDER — GABAPENTIN 300 MG PO CAPS: 300.0000 mg | ORAL_CAPSULE | Freq: Two times a day (BID) | ORAL | 0 refills | Status: AC

## 2024-09-28 NOTE — TOC Transition Note (Signed)
 Transition of Care [facility]) - Discharge Note   Patient Details  Name: Randall Sullivan MRN: [id number] Date of Birth: 02-Mar-1951  Transition of Care [facility]) CM/SW Contact:  Noreen KATHEE Cleotilde ISRAEL Phone Number: 09/28/2024, 11:44 AM   Clinical Narrative:     CSW spoke with patient spouse to confirm the delivery of patient equipment today. Spouse shared that delivery will be at 1:00 pm and after delivery / setup , her and patient daughter will pick patient up before or at 2-2:30 pm. CSW updated nurse and signing off.  Final next level of care: Home w Hospice Care Barriers to Discharge: Barriers Resolved   Patient Goals and CMS Choice Patient states their goals for this hospitalization and ongoing recovery are:: return home with hospice          Discharge Placement                Patient to be transferred to facility by: POV Name of family member notified: Lisa-spouse Patient and family notified of of transfer: 09/28/24  Discharge Plan and Services Additional resources added to the After Visit Summary for       Post Acute Care Choice: Home Health          DME Arranged:  (Hospice has arranged DME) DME Agency:  [facility] Hospice has arranged Oxygen ) Date DME Agency Contacted: 09/28/24 Time DME Agency Contacted: 1300 Representative spoke with at DME Agency: [employer] rep informed family of time for delivery HH Arranged: PT, RN HH Agency: [facility] Date HH Agency Contacted: 09/20/24   Representative spoke with at [facility] Agency: [facility] with [facility]  Social Drivers of Health (SDOH) Interventions SDOH Screenings   Food Insecurity: No Food Insecurity (09/18/2024)  Housing: Low Risk (09/18/2024)  Transportation Needs: No Transportation Needs (09/18/2024)  Utilities: Not At Risk (09/18/2024)  Depression (PHQ2-9): Low Risk (08/24/2024)  Financial Resource Strain: Low Risk (08/23/2024)  Physical Activity: Insufficiently Active (08/23/2024)  Social  Connections: Socially Isolated (09/18/2024)  Stress: No Stress Concern Present (08/23/2024)  Tobacco Use: Medium Risk (09/18/2024)     Readmission Risk Interventions    09/28/2024   11:41 AM 09/20/2024   11:50 AM 08/05/2024    9:12 AM  Readmission Risk Prevention Plan  Transportation Screening Complete Complete Complete  PCP or Specialist Appt within 3-5 Days  Complete   HRI or Home Care Consult Complete Complete Complete  Social Work Consult for Recovery Care Planning/Counseling Complete  Complete  Palliative Care Screening Complete  Not Applicable  Medication Review Oceanographer) Complete Complete Complete

## 2024-09-28 NOTE — Progress Notes (Signed)
 PHARMACY - ANTICOAGULATION CONSULT NOTE  Pharmacy Consult for warfarin Indication: atrial fibrillation  Allergies[1]  Patient Measurements: Height: 5' 10 (177.8 cm) Weight: 84.8 kg (186 lb 15.2 oz) IBW/kg (Calculated) : 73 HEPARIN  DW (KG): 91.9  Vital Signs: Temp: 98.3 F (36.8 C) (12/30 0610) Temp Source: Oral (12/30 0610) BP: 138/90 (12/30 0610) Pulse Rate: 88 (12/30 0610)  Labs: Recent Labs    09/26/24 0500 09/27/24 0516 09/28/24 0428  HGB  --  11.0*  --   HCT  --  36.6*  --   PLT  --  163  --   LABPROT 22.9* 25.5* 35.3*  INR 1.9* 2.2* 3.3*    Estimated Creatinine Clearance: 76.9 mL/min (by C-G formula based on SCr of 0.87 mg/dL).   Medical History: Past Medical History:  Diagnosis Date   A-fib (HCC) 2017   after last lung surgery patient had a history if Afib documented in hospital    AAA (abdominal aortic aneurysm)    4.0 cm 09/17/22 US    Acute appendicitis 07/14/2021   Anxiety and depression 09/18/2024   Arthritis    back & legs ( knees)   Cancer (HCC)    lung   Complication of anesthesia    agitated upon waking from anesth.    Dyspnea    Dysrhythmia    A. Fib   Emphysema    Emphysema of lung (HCC)    GERD (gastroesophageal reflux disease) 09/18/2024   History of kidney stones    Hyperlipidemia    Hypertension    Oxygen  dependent    2L continuous   Peripheral vascular disease    Aortic Aneurysm   Pneumonia    Pre-diabetes    Sleep apnea    No OSA, but wears 2L [place] O2 continuous including at night   Stroke [facility]) 2022   Right sided weakness, decreased hearing, balance and memory loss   Tobacco abuse     Assessment: Pharmacy consulted to dose warfarin in patient with atrial fibrillation.  Patient's home dose listed as 5 mg on Mon + Fri and 2.5 mg ROW.  INR on admission is therapeutic at 2.6.  INR 1.9 > 2.2 > 3.3 CBC WNL  Goal of Therapy:  INR 2-3 Monitor platelets by anticoagulation protocol: Yes   Plan:  Hold warfarin x 1  dose Monitor daily INR and s/s of bleeding  Elspeth Sour, PharmD Clinical Pharmacist 09/28/2024 8:57 AM           [1] No Active Allergies

## 2024-09-28 NOTE — Plan of Care (Signed)
" °  Problem: Education: Goal: Knowledge of General Education information will improve Description: Including pain rating scale, medication(s)/side effects and non-pharmacologic comfort measures Outcome: Progressing   Problem: Clinical Measurements: Goal: Ability to maintain clinical measurements within normal limits will improve Outcome: Progressing Goal: Respiratory complications will improve Outcome: Progressing   Problem: Activity: Goal: Risk for activity intolerance will decrease Outcome: Progressing   Problem: Pain Managment: Goal: General experience of comfort will improve and/or be controlled Outcome: Progressing   Problem: Safety: Goal: Ability to remain free from injury will improve Outcome: Progressing   Problem: Metabolic: Goal: Ability to maintain appropriate glucose levels will improve Outcome: Progressing   "

## 2024-09-28 NOTE — Care Management Important Message (Signed)
 Important Message  Patient Details  Name: Randall Sullivan MRN: [id number] Date of Birth: 1951/09/28   Important Message Given:  Yes - Medicare IM     Emmer Lillibridge L Ladarian Bonczek 09/28/2024, 11:48 AM

## 2024-09-28 NOTE — Discharge Summary (Signed)
 " Physician Discharge Summary   Patient: Randall Sullivan MRN: [id number] DOB: 07/10/1951  Admit date:     09/18/2024  Discharge date: 09/28/2024  Discharge Physician: Carliss LELON Canales   PCP: Tobie Suzzane POUR, MD   Recommendations at discharge:    Pt to be discharged home with home hospice.   If you experience worsening fever, chills, chest pain, shortness of breath, or other concerning symptoms, please call your PCP or go to the emergency department immediately.  Discharge Diagnoses: Principal Problem:   Acute on chronic respiratory failure with hypoxia (HCC) Active Problems:   Small cell lung cancer (HCC)   Essential hypertension   Mixed hyperlipidemia   Type 2 diabetes mellitus (HCC)   AAA (abdominal aortic aneurysm)   Atrial fibrillation (HCC)   COPD with acute exacerbation (HCC)   Anxiety and depression   GERD (gastroesophageal reflux disease)   BPH (benign prostatic hyperplasia)   Elevated brain natriuretic peptide (BNP) level   CVA (cerebral vascular accident) (HCC)  Resolved Problems:   * No resolved hospital problems. *   Hospital Course:  73 y.o. male with medical history significant of non-small cell lung cancer, COPD, chronic respiratory failure on 2-3 L supplementation, paroxysmal atrial fibrillation (on Coumadin ), AAA, hypertension, hyperlipidemia, prediabetes, BPH, and prior history of CVA; who presented to the hospital secondary to shortness of breath and worsening mildly productive coughing spells.  Admitted for management of acute chronic hypoxic respiratory failure comfortably as well as COPD exacerbation.   Assessment and Plan:   Acute on chronic hypoxic respiratory failure - Currently weaned down to 3 L nasal cannula which is around patient's baseline of 2-3 L.  Multifactorial etiology given underlying COPD exacerbation, immunotherapy induced pneumonitis, possible CAP.   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  Showing improvement.  Palliative consult placed.  Patient is DNR/DNI.  Will discharge with prednisone  taper as well as Protonix  for ulcer prophylaxis.   Possible commune acquired pneumonia - Negative for COVID/RSV/flu.  Given extensive ground glass opacities and severe emphysema, receiving empiric broad-spectrum antibiotics.  No indication for bronchoscopy.   Acute COPD exacerbation - As noted above, CT confirming severe emphysema.     Paroxysmal atrial fibrillation - S/p cardioversion.  Continue Cardizem  and metoprolol .  Warfarin on board.   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  Follows with oncology in the outpatient setting.  Palliative care consulted.   BPH - Flomax  on board.   Chronic HFpEF - Currently on p.o. Lasix  40 mg daily.  Will transition to p.o. Lasix  20 mg daily upon discharge.   Goals of care - Given patient's lung cancer, worsening chronic hypoxia, immunotherapy pneumonitis, discussions about ongoing palliative/hospice care.  Working closely with TOC, family, patient on disposition planning.  Patient to be discharged home with hospice.  DME equipment to be delivered first.  Patient requesting gabapentin  as well as prednisone  and Lasix  to go.  Likely that patient's medication regiment will be transitioned once established with home hospice.  Patient not having pain or worsening shortness of breath at this time so we will hold off on morphine  prescriptions.  Consultants: Palliative care, hospice Procedures performed: None Disposition: Home with hospice Diet recommendation:  Regular diet  DISCHARGE MEDICATION: Allergies as of 09/28/2024   No Active Allergies      Medication List     STOP taking these medications  fenofibrate  145 MG tablet Commonly known as: TRICOR    HYDROcodone  bit-homatropine 5-1.5 MG/5ML syrup Commonly known as:  HYCODAN   hydrOXYzine  25 MG tablet Commonly known as: ATARAX        TAKE these medications    albuterol  108 (90 Base) MCG/ACT inhaler Commonly known as: VENTOLIN  HFA Inhale 2 puffs into the lungs every 4 (four) hours as needed for wheezing or shortness of breath.   ALOXI  IV Inject into the vein.   Breztri  Aerosphere 160-9-4.8 MCG/ACT Aero inhaler Generic drug: budesonide -glycopyrrolate -formoterol  Inhale 2 puffs into the lungs 2 (two) times daily.   busPIRone  5 MG tablet Commonly known as: BUSPAR  Take 1 tablet (5 mg total) by mouth 3 (three) times daily.   cetirizine  10 MG tablet Commonly known as: ZYRTEC  Take 10 mg by mouth daily.   diltiazem  120 MG 24 hr capsule Commonly known as: CARDIZEM  CD Take 1 capsule (120 mg total) by mouth daily.   finasteride  5 MG tablet Commonly known as: PROSCAR  Take 1 tablet by mouth once daily   furosemide  20 MG tablet Commonly known as: LASIX  Take 1 tablet (20 mg total) by mouth daily. Start taking on: September 29, 2024   gabapentin  300 MG capsule Commonly known as: NEURONTIN  Take 1 capsule (300 mg total) by mouth 2 (two) times daily.   guaiFENesin -dextromethorphan  100-10 MG/5ML syrup Commonly known as: ROBITUSSIN DM Take 10 mLs by mouth every 8 (eight) hours as needed for cough.   ipratropium-albuterol  0.5-2.5 (3) MG/3ML Soln Commonly known as: DUONEB Take 3 mLs by nebulization every 6 (six) hours as needed. What changed: when to take this   magnesium  oxide 400 (240 Mg) MG tablet Commonly known as: MAG-OX Take 1 tablet (400 mg total) by mouth 2 (two) times daily.   metoprolol  succinate 100 MG 24 hr tablet Commonly known as: Toprol  XL Take 1 tablet (100 mg total) by mouth daily. Take with or immediately following a meal.   ondansetron  4 MG tablet Commonly known as: ZOFRAN  Take 1 tablet (4 mg total) by mouth every 6 (six) hours as needed for nausea.   OXYGEN  Inhale 2 L into the lungs continuous. Hx of lung ca, copd, and  emphysema   pantoprazole  40 MG tablet Commonly known as: PROTONIX  Take 1 tablet (40 mg total) by mouth 2 (two) times daily.   predniSONE  10 MG tablet Commonly known as: DELTASONE  Take 4 tablets (40 mg total) by mouth daily for 3 days, THEN 3 tablets (30 mg total) daily for 3 days, THEN 2 tablets (20 mg total) daily for 3 days, THEN 1 tablet (10 mg total) daily for 3 days. Start taking on: September 28, 2024   rosuvastatin  40 MG tablet Commonly known as: CRESTOR  Take 1 tablet (40 mg total) by mouth daily.   tamsulosin  0.4 MG Caps capsule Commonly known as: FLOMAX  Take 2 capsules (0.8 mg total) by mouth daily.   warfarin 5 MG tablet Commonly known as: COUMADIN  Take as directed. If you are unsure how to take this medication, talk to your nurse or doctor. Original instructions: Take 0.5-1 tablets (2.5-5 mg total) by mouth See admin instructions. Take 2.5 mg by mouth Tuesday and Friday and take 5 mg on Monday, Wednesday, Thursday, Saturday and Sunday What changed: additional instructions         Discharge Exam: Filed Weights   09/26/24 0500 09/27/24 0400 09/28/24 0500  Weight: 87 kg 87 kg 84.8 kg    GENERAL:  Alert, pleasant, no acute distress, disheveled HEENT:  EOMI,  nasal cannula CARDIOVASCULAR:  RRR, no murmurs appreciated RESPIRATORY: Poor air movement bilaterally GASTROINTESTINAL:  Soft, nontender, nondistended EXTREMITIES:  No LE edema bilaterally NEURO:  No new focal deficits appreciated SKIN:  No rashes noted PSYCH:  Appropriate mood and affect    Condition at discharge: stable  The results of significant diagnostics from this hospitalization (including imaging, microbiology, ancillary and laboratory) are listed below for reference.   Imaging Studies: CT CHEST W CONTRAST Result Date: 09/23/2024 CLINICAL DATA:  Pneumonia, lung cancer.  * Tracking Code: BO * EXAM: CT CHEST WITH CONTRAST TECHNIQUE: Multidetector CT imaging of the chest was performed during  intravenous contrast administration. RADIATION DOSE REDUCTION: This exam was performed according to the departmental dose-optimization program which includes automated exposure control, adjustment of the mA and/or kV according to patient size and/or use of iterative reconstruction technique. CONTRAST:  75mL OMNIPAQUE  IOHEXOL  300 MG/ML  SOLN COMPARISON:  08/02/2024. FINDINGS: Cardiovascular: Left IJ Port-A-Cath terminates in the high right atrium. Atherosclerotic calcification of the aorta, aortic valve and coronary arteries. Enlarged pulmonic trunk and heart. Small pericardial effusion. Mediastinum/Nodes: Thoracic inlet lymph nodes are not enlarged by CT size criteria. No pathologically enlarged mediastinal, hilar or axillary lymph nodes. Soft tissue thickening in the right hilum, as before, treatment related. Air in the esophagus can be seen with dysmotility. There may be mild thickening of the distal esophageal wall which can be seen with gastroesophageal reflux disease. Lungs/Pleura: Centrilobular and paraseptal emphysema. Postoperative scarring and volume loss in the medial right hemithorax with soft tissue thickening in the right suprahilar region (4/49), unchanged. Previously seen area of mixed consolidation and ground-glass in the lateral posterior segment right upper lobe (4/45) has decreased in size in the interval. New and progressive areas of mixed ground-glass and/or consolidation bilaterally. Patchy areas of generalized ground-glass, right greater than left. Trace right and small left pleural effusion. Left upper lobectomy. Post treatment narrowing of the right upper and right middle lobe bronchi. Debris in the airway. Upper Abdomen: Cholecystectomy. Small left adrenal myelolipoma. No specific follow-up necessary. Small low-attenuation lesion in the upper pole left kidney. No specific follow-up necessary. Visualized portions of the liver, adrenal glands, kidneys, spleen, pancreas, stomach and bowel are  otherwise grossly unremarkable. No upper abdominal adenopathy. Musculoskeletal: Degenerative changes in the spine. Osteopenia. No worrisome lytic or sclerotic lesions. IMPRESSION: 1. Postoperative and post treatment scarring in the medial right hemithorax, stable. 2. Waxing and waning areas of ground-glass and/or consolidation, indicative of an ongoing atypical or fungal infectious process. 3. Small pericardial effusion. 4. Trace right and small left pleural effusions. 5. Small left adrenal myelolipoma. 6. Aortic atherosclerosis (ICD10-I70.0). Coronary artery calcification. 7. Enlarged pulmonic trunk, indicative of pulmonary arterial hypertension. 8.  Emphysema (ICD10-J43.9). Electronically Signed   By: Newell Eke M.D.   On: 09/23/2024 13:02   DG CHEST PORT 1 VIEW Result Date: 09/23/2024 EXAM: 1 VIEW(S) XRAY OF THE CHEST 09/23/2024 04:52:29 AM COMPARISON: AP and lateral chest 09/21/2024. CLINICAL HISTORY: Hypoxia 200808 Hypoxia. FINDINGS: LINES, TUBES AND DEVICES: Left IJ port catheter terminates at the superior cavoatrial junction. LUNGS AND PLEURA: The lungs are emphysematous with bilateral postsurgical and scarring changes again noted. Patchy airspace infiltrate continues in the right mid to lower lung field with a small right pleural effusion. No other focal infiltrate seen. No new abnormality. No pneumothorax. HEART AND MEDIASTINUM: There is aortic atherosclerosis. Mild cardiomegaly. No acute abnormality of the mediastinal and cardiac silhouette. BONES AND SOFT TISSUES: No acute osseous abnormality. IMPRESSION: 1. Patchy airspace  infiltrate in the right mid to lower lung field with a small right pleural effusion. Overall aeration seems unchanged. 2. Emphysematous lungs with bilateral postsurgical and scarring changes. 3. Mild cardiomegaly. Electronically signed by: Francis Quam MD 09/23/2024 05:05 AM EST RP Workstation: [id number]   DG Chest 2 View Result Date: 09/21/2024 EXAM: 2 VIEW(S) XRAY OF  THE CHEST 09/21/2024 01:20:00 PM COMPARISON: 09/18/2024 CLINICAL HISTORY: Hypoxia FINDINGS: LINES, TUBES AND DEVICES: Stable left IJ port catheter to proximal right atrium. LUNGS AND PLEURA: Right upper lung staple line. Similar patchy airspace opacities throughout right lung. Stable coarse interstitial opacities at left lung base. Small right pleural effusion with blunting of right lateral costophrenic angle. No pneumothorax. HEART AND MEDIASTINUM: No acute abnormality of the cardiac and mediastinal silhouettes. BONES AND SOFT TISSUES: No acute osseous abnormality. IMPRESSION: 1. Similar pneumonia on the right lung. Small right pleural effusion. Electronically signed by: Norman Gatlin MD 09/21/2024 10:53 PM EST RP Workstation: [id number]   DG Chest Port 1 View if patient is in a treatment room. Result Date: 09/18/2024 EXAM: 1 VIEW(S) XRAY OF THE CHEST 09/18/2024 09:27:00 AM COMPARISON: 08/31/2024 CLINICAL HISTORY: Suspected Sepsis FINDINGS: LINES, TUBES AND DEVICES: Left chest port stable in position. LUNGS AND PLEURA: Surgical clips in left upper lung. New small right pleural effusion. Increased patchy airspace opacities in right lung. Increased interstitial opacities in left lung base. Postsurgical changes in right upper lung. No pneumothorax. HEART AND MEDIASTINUM: No acute abnormality of the cardiac and mediastinal silhouettes. BONES AND SOFT TISSUES: No acute osseous abnormality. IMPRESSION: 1. Increased patchy airspace opacities in the right lung and increased interstitial opacities in the left lung base, suspicious for infection in the setting of sepsis. 2. New small right pleural effusion. Electronically signed by: Waddell Calk MD 09/18/2024 09:31 AM EST RP Workstation: [id number]   DG Chest 2 View Result Date: 09/02/2024 CLINICAL DATA:  COPD exacerbation EXAM: CHEST - 2 VIEW COMPARISON:  Chest x-ray performed August 02, 2024 FINDINGS: A left-sided port is present with tip terminating in the right  cavoatrial junction. Postsurgical changes are present in the left hemithorax and right hemithorax. Mild interstitial prominence. Patchy opacity is annotated in the right lung base. No pleural effusion or pneumothorax. Mild hyperexpansion. Degenerative changes in the thoracic spine. IMPRESSION: 1. Patchy airspace opacity is present in the right middle lobe, possibly developing pneumonia. 2. Underlying postsurgical changes and changes of chronic lung disease. Electronically Signed   By: Maude Naegeli M.D.   On: 09/02/2024 15:20    Microbiology: Results for orders placed or performed during the hospital encounter of 09/18/24  Culture, blood (Routine x 2)     Status: None   Collection Time: 09/18/24  9:22 AM   Specimen: BLOOD RIGHT ARM  Result Value Ref Range Status   Specimen Description   Final    BLOOD RIGHT ARM BOTTLES DRAWN AEROBIC AND ANAEROBIC   Special Requests Blood Culture adequate volume  Final   Culture   Final    NO GROWTH 5 DAYS Performed at [facility], [address], [place], [place] [address]    Report Status 09/23/2024 FINAL  Final  Culture, blood (Routine x 2)     Status: None   Collection Time: 09/18/24  9:22 AM   Specimen: BLOOD RIGHT ARM  Result Value Ref Range Status   Specimen Description   Final    BLOOD RIGHT ARM BOTTLES DRAWN AEROBIC AND ANAEROBIC   Special Requests Blood Culture adequate volume  Final   Culture  Final    NO GROWTH 5 DAYS Performed at [facility], [address] [place], [place] [address]    Report Status 09/23/2024 FINAL  Final  Resp panel by RT-PCR (RSV, Flu A&B, Covid) Anterior Nasal Swab     Status: None   Collection Time: 09/18/24  9:22 AM   Specimen: Anterior Nasal Swab  Result Value Ref Range Status   SARS Coronavirus 2 by RT PCR NEGATIVE NEGATIVE Final    Comment: (NOTE) SARS-CoV-2 target nucleic acids are NOT DETECTED.  The SARS-CoV-2 RNA is generally detectable in upper respiratory specimens during the acute phase of infection.  The lowest concentration of SARS-CoV-2 viral copies this assay can detect is 138 copies/mL. A negative result does not preclude SARS-Cov-2 infection and should not be used as the sole basis for treatment or other patient management decisions. A negative result may occur with  improper specimen collection/handling, submission of specimen other than nasopharyngeal swab, presence of viral mutation(s) within the areas targeted by this assay, and inadequate number of viral copies(<138 copies/mL). A negative result must be combined with clinical observations, patient history, and epidemiological information. The expected result is Negative.  Fact Sheet for Patients:  bloggercourse.com  Fact Sheet for Healthcare Providers:  seriousbroker.it  This test is no t yet approved or cleared by the [place]  FDA and  has been authorized for detection and/or diagnosis of SARS-CoV-2 by FDA under an Emergency Use Authorization (EUA). This EUA will remain  in effect (meaning this test can be used) for the duration of the COVID-19 declaration under Section 564(b)(1) of the Act, 21 U.S.C.section 360bbb-3(b)(1), unless the authorization is terminated  or revoked sooner.       Influenza A by PCR NEGATIVE NEGATIVE Final   Influenza B by PCR NEGATIVE NEGATIVE Final    Comment: (NOTE) The Xpert Xpress SARS-CoV-2/FLU/RSV plus assay is intended as an aid in the diagnosis of influenza from Nasopharyngeal swab specimens and should not be used as a sole basis for treatment. Nasal washings and aspirates are unacceptable for Xpert Xpress SARS-CoV-2/FLU/RSV testing.  Fact Sheet for Patients: bloggercourse.com  Fact Sheet for Healthcare Providers: seriousbroker.it  This test is not yet approved or cleared by the [place]  FDA and has been authorized for detection and/or diagnosis of SARS-CoV-2 by FDA  under an Emergency Use Authorization (EUA). This EUA will remain in effect (meaning this test can be used) for the duration of the COVID-19 declaration under Section 564(b)(1) of the Act, 21 U.S.C. section 360bbb-3(b)(1), unless the authorization is terminated or revoked.     Resp Syncytial Virus by PCR NEGATIVE NEGATIVE Final    Comment: (NOTE) Fact Sheet for Patients: bloggercourse.com  Fact Sheet for Healthcare Providers: seriousbroker.it  This test is not yet approved or cleared by the [place]  FDA and has been authorized for detection and/or diagnosis of SARS-CoV-2 by FDA under an Emergency Use Authorization (EUA). This EUA will remain in effect (meaning this test can be used) for the duration of the COVID-19 declaration under Section 564(b)(1) of the Act, 21 U.S.C. section 360bbb-3(b)(1), unless the authorization is terminated or revoked.  Performed at [facility], [address], [place], [place] [address]   MRSA Next Gen by PCR, Nasal     Status: None   Collection Time: 09/18/24  5:00 PM   Specimen: Nasal Mucosa; Nasal Swab  Result Value Ref Range Status   MRSA by PCR Next Gen NOT DETECTED NOT DETECTED Final    Comment: (NOTE) The GeneXpert  MRSA Assay (FDA approved for NASAL specimens only), is one component of a comprehensive MRSA colonization surveillance program. It is not intended to diagnose MRSA infection nor to guide or monitor treatment for MRSA infections. Test performance is not FDA approved in patients less than 2 years old. Performed at [facility], [address], [place], [place] [address]   Expectorated Sputum Assessment w Gram Stain, Rflx to Resp Cult     Status: None   Collection Time: 09/23/24  3:30 PM   Specimen: SPU  Result Value Ref Range Status   Specimen Description SPUTUM  Final   Special Requests NONE  Final   Sputum evaluation   Final    THIS SPECIMEN IS ACCEPTABLE FOR SPUTUM  CULTURE Performed at [facility], [address] [place], [place] [address]    Report Status 09/23/2024 FINAL  Final  Culture, Respiratory w Gram Stain     Status: None (Preliminary result)   Collection Time: 09/23/24  3:30 PM   Specimen: SPU  Result Value Ref Range Status   Specimen Description   Final    SPUTUM Performed at [facility], [address], [place], [place] [address]    Special Requests   Final    NONE Reflexed from [id number] Performed at [facility], [address], [place], [place] [address]    Gram Stain   Final    FEW WBC PRESENT, PREDOMINANTLY PMN MODERATE GRAM NEGATIVE RODS FEW GRAM POSITIVE COCCI    Culture   Final    MODERATE STENOTROPHOMONAS MALTOPHILIA Sent to [facility] for further susceptibility testing. Performed at [facility], [address] [place], [place] [address]    Report Status PENDING  Incomplete   *Note: Due to a large number of results and/or encounters for the requested time period, some results have not been displayed. A complete set of results can be found in Results Review.    Labs: CBC: Recent Labs  Lab 09/27/24 0516  WBC 14.5*  HGB 11.0*  HCT 36.6*  MCV 78.5*  PLT 163   Basic Metabolic Panel: Recent Labs  Lab 09/22/24 0449  NA 144  K 4.1  CL 98  CO2 36*  GLUCOSE 151*  BUN 36*  CREATININE 0.87  CALCIUM  9.2   Liver Function Tests: No results for input(s): AST, ALT, ALKPHOS, BILITOT, PROT, ALBUMIN  in the last 168 hours. CBG: Recent Labs  Lab 09/27/24 0737 09/27/24 1104 09/27/24 1611 09/27/24 2138 09/28/24 0738  GLUCAP 143* 214* 259* 395* 148*    Discharge time spent: 40 minutes.  Length of inpatient stay: 10 days  Signed: Carliss LELON Canales, DO [facility] 09/28/2024         "

## 2024-09-29 ENCOUNTER — Telehealth: Payer: Self-pay

## 2024-09-29 NOTE — Transitions of Care (Post Inpatient/ED Visit) (Signed)
" ° °  09/29/2024  Name: Randall Sullivan MRN: [id number] DOB: 1950/11/29  Today's TOC FU Call Status: Today's TOC FU Call Status:: Successful TOC FU Call Completed TOC FU Call Complete Date: 09/29/24  Patient's Name and Date of Birth confirmed. Name, DOB (With [name].)  Transition Care Management Follow-up Telephone Call Date of Discharge: 09/28/24 [name] Type of Discharge: Inpatient Admission Primary Inpatient Discharge Diagnosis:: Acute on chronic respiratory failure with hypoxia [name]), Discharged home with hospice care.  Patient discharged to home with hospice care via [facility] as choice per ICM notes.   Unsuccessful outreach x1 to family/spouse number but left a confidential message with call back number if needed to [name] RN CM Reached out to [facility] transferred to referral line/department at [phone] after contacting main number.  Sherri confirmed appointment time of 1000 am today, 09/29/24, for first hospice at home visit.  TOC RN made outreach to verify receipt of hospice referral and start of care. RN confirmed that patient is actively enrolled in hospice program.  Hospice Agency: [facility] at referral department: [phone] RN spoke with: Maeola at [facility] referral department direct line: [phone] Start of Care Date: 09/29/2024 at 1000 am  No further interventions at this time.   Bing Edison MSN, RN RN [employer] [facility]  [facility] Office Hours M-F [phone] Direct Dial: [phone] Main Phone [phone]  Fax: [phone] [email or web address]    "

## 2024-10-01 ENCOUNTER — Telehealth: Payer: Self-pay | Admitting: Internal Medicine

## 2024-10-01 LAB — CULTURE, RESPIRATORY W GRAM STAIN

## 2024-10-01 LAB — SUSCEPTIBILITY, AER + ANAEROB

## 2024-10-01 LAB — SUSCEPTIBILITY RESULT

## 2024-10-01 NOTE — Telephone Encounter (Signed)
 Copied from CRM [id number]. Topic: General - Other >> Oct 01, 2024 12:55 PM Rachelle R wrote: Reason for CRM: Patients wife [name] is calling for an update on her request for family medical leave to take care of her husband who is under hospice care. States [facility] faxed in paperwork for Dr Tobie to fill out.  [name] does say Dr Tobie filled out intermittent leave paperwork for treatment but now needs to stay home to take care of him. He is currently under hospice care, new paperwork should have been faxed to the office to be completed.   [name] can be reached at [phone]

## 2024-10-04 NOTE — Telephone Encounter (Signed)
 Forms have been completed and faxed on 09/28/24.

## 2024-10-04 NOTE — Telephone Encounter (Signed)
 Spoke to pt spouse let her know forms were received and faxed on 12/29 states some forms were for intermittent others  were for long term, will check our box today to see if any new forms were received. Just an FYI

## 2024-10-07 ENCOUNTER — Encounter: Payer: Self-pay | Admitting: Internal Medicine

## 2024-10-07 NOTE — Telephone Encounter (Signed)
 Patient daughter came by to pick up forms, but she is no on the DPR did not give forms to her. Patient or his spouse Randall Sullivan will need to pick up the forms.

## 2024-10-11 ENCOUNTER — Encounter: Payer: Self-pay | Admitting: Internal Medicine

## 2024-10-11 ENCOUNTER — Other Ambulatory Visit: Payer: Self-pay

## 2024-10-11 DIAGNOSIS — J449 Chronic obstructive pulmonary disease, unspecified: Secondary | ICD-10-CM

## 2024-10-11 MED ORDER — IPRATROPIUM-ALBUTEROL 0.5-2.5 (3) MG/3ML IN SOLN: 3.0000 mL | Freq: Four times a day (QID) | RESPIRATORY_TRACT | 1 refills | Status: AC | PRN

## 2024-10-11 NOTE — Telephone Encounter (Signed)
 Refill sent to pharmacy.

## 2024-10-13 NOTE — Telephone Encounter (Signed)
 Pt [facility] for [facility]

## 2024-10-19 ENCOUNTER — Ambulatory Visit

## 2024-10-26 ENCOUNTER — Inpatient Hospital Stay: Admitting: Internal Medicine

## 2024-10-26 DIAGNOSIS — J9611 Chronic respiratory failure with hypoxia: Secondary | ICD-10-CM

## 2024-10-26 DIAGNOSIS — J449 Chronic obstructive pulmonary disease, unspecified: Secondary | ICD-10-CM

## 2024-11-10 ENCOUNTER — Other Ambulatory Visit (HOSPITAL_COMMUNITY)

## 2024-11-10 ENCOUNTER — Inpatient Hospital Stay

## 2024-11-12 ENCOUNTER — Ambulatory Visit: Admitting: Internal Medicine

## 2024-11-17 ENCOUNTER — Inpatient Hospital Stay: Admitting: Oncology
